# Patient Record
Sex: Female | Born: 1940 | Race: White | Hispanic: No | Marital: Married | Smoking: Never smoker
Health system: Southern US, Community
[De-identification: ages and names within clinical notes are randomized; demographics above are authoritative.]

## PROBLEM LIST (undated history)

## (undated) DIAGNOSIS — M1990 Unspecified osteoarthritis, unspecified site: Secondary | ICD-10-CM

## (undated) DIAGNOSIS — E785 Hyperlipidemia, unspecified: Secondary | ICD-10-CM

## (undated) DIAGNOSIS — M549 Dorsalgia, unspecified: Secondary | ICD-10-CM

## (undated) DIAGNOSIS — M419 Scoliosis, unspecified: Secondary | ICD-10-CM

## (undated) DIAGNOSIS — K56609 Unspecified intestinal obstruction, unspecified as to partial versus complete obstruction: Secondary | ICD-10-CM

## (undated) DIAGNOSIS — I480 Paroxysmal atrial fibrillation: Secondary | ICD-10-CM

## (undated) DIAGNOSIS — K219 Gastro-esophageal reflux disease without esophagitis: Secondary | ICD-10-CM

## (undated) DIAGNOSIS — G4733 Obstructive sleep apnea (adult) (pediatric): Secondary | ICD-10-CM

## (undated) DIAGNOSIS — M479 Spondylosis, unspecified: Secondary | ICD-10-CM

## (undated) DIAGNOSIS — M48061 Spinal stenosis, lumbar region without neurogenic claudication: Secondary | ICD-10-CM

## (undated) DIAGNOSIS — G8929 Other chronic pain: Secondary | ICD-10-CM

## (undated) DIAGNOSIS — I10 Essential (primary) hypertension: Secondary | ICD-10-CM

## (undated) DIAGNOSIS — I509 Heart failure, unspecified: Secondary | ICD-10-CM

## (undated) HISTORY — DX: Hyperlipidemia, unspecified: E78.5

## (undated) HISTORY — DX: Scoliosis, unspecified: M41.9

## (undated) HISTORY — DX: Unspecified intestinal obstruction, unspecified as to partial versus complete obstruction: K56.609

## (undated) HISTORY — DX: Spondylosis, unspecified: M47.9

## (undated) HISTORY — DX: Unspecified osteoarthritis, unspecified site: M19.90

## (undated) HISTORY — PX: BACK SURGERY: SHX140

## (undated) HISTORY — DX: Essential (primary) hypertension: I10

## (undated) HISTORY — DX: Paroxysmal atrial fibrillation: I48.0

## (undated) HISTORY — DX: Spinal stenosis, lumbar region without neurogenic claudication: M48.061

## (undated) HISTORY — PX: NASAL SEPTUM SURGERY: SHX37

## (undated) HISTORY — PX: POSTERIOR LUMBAR FUSION: SHX6036

---

## 1979-02-06 HISTORY — PX: LIPOMA EXCISION: SHX5283

## 1993-02-05 HISTORY — PX: TOTAL ABDOMINAL HYSTERECTOMY: SHX209

## 1999-07-28 ENCOUNTER — Other Ambulatory Visit: Admission: RE | Admit: 1999-07-28 | Discharge: 1999-07-28 | Payer: Self-pay | Admitting: *Deleted

## 2000-07-07 ENCOUNTER — Encounter: Payer: Self-pay | Admitting: Emergency Medicine

## 2000-07-07 ENCOUNTER — Inpatient Hospital Stay (HOSPITAL_COMMUNITY): Admission: EM | Admit: 2000-07-07 | Discharge: 2000-07-08 | Payer: Self-pay | Admitting: Emergency Medicine

## 2000-10-04 ENCOUNTER — Encounter: Admission: RE | Admit: 2000-10-04 | Discharge: 2000-10-04 | Payer: Self-pay | Admitting: Family Medicine

## 2000-10-04 ENCOUNTER — Encounter: Payer: Self-pay | Admitting: Family Medicine

## 2000-10-31 ENCOUNTER — Encounter: Payer: Self-pay | Admitting: Neurosurgery

## 2000-10-31 ENCOUNTER — Encounter: Admission: RE | Admit: 2000-10-31 | Discharge: 2000-10-31 | Payer: Self-pay | Admitting: Neurosurgery

## 2000-11-14 ENCOUNTER — Encounter: Admission: RE | Admit: 2000-11-14 | Discharge: 2000-11-14 | Payer: Self-pay | Admitting: Neurosurgery

## 2000-11-14 ENCOUNTER — Encounter: Payer: Self-pay | Admitting: Neurosurgery

## 2001-03-21 ENCOUNTER — Encounter: Payer: Self-pay | Admitting: Family Medicine

## 2001-03-21 ENCOUNTER — Encounter: Admission: RE | Admit: 2001-03-21 | Discharge: 2001-03-21 | Payer: Self-pay | Admitting: Family Medicine

## 2001-05-22 ENCOUNTER — Encounter: Admission: RE | Admit: 2001-05-22 | Discharge: 2001-05-22 | Payer: Self-pay | Admitting: Family Medicine

## 2001-05-22 ENCOUNTER — Encounter: Payer: Self-pay | Admitting: Family Medicine

## 2003-04-19 ENCOUNTER — Encounter: Admission: RE | Admit: 2003-04-19 | Discharge: 2003-04-19 | Payer: Self-pay | Admitting: Family Medicine

## 2003-05-04 ENCOUNTER — Ambulatory Visit (HOSPITAL_COMMUNITY): Admission: RE | Admit: 2003-05-04 | Discharge: 2003-05-04 | Payer: Self-pay | Admitting: Family Medicine

## 2004-09-15 ENCOUNTER — Ambulatory Visit: Payer: Self-pay | Admitting: Internal Medicine

## 2004-09-24 ENCOUNTER — Ambulatory Visit: Payer: Self-pay | Admitting: Internal Medicine

## 2004-10-22 ENCOUNTER — Emergency Department (HOSPITAL_COMMUNITY): Admission: EM | Admit: 2004-10-22 | Discharge: 2004-10-22 | Payer: Self-pay | Admitting: *Deleted

## 2004-12-09 HISTORY — PX: COLONOSCOPY: SHX174

## 2005-01-07 ENCOUNTER — Ambulatory Visit: Payer: Self-pay | Admitting: Internal Medicine

## 2005-02-18 ENCOUNTER — Ambulatory Visit: Payer: Self-pay | Admitting: Internal Medicine

## 2005-05-03 ENCOUNTER — Ambulatory Visit: Payer: Self-pay | Admitting: Internal Medicine

## 2005-08-26 ENCOUNTER — Ambulatory Visit: Payer: Self-pay | Admitting: Internal Medicine

## 2005-12-16 ENCOUNTER — Ambulatory Visit: Payer: Self-pay | Admitting: Internal Medicine

## 2006-01-31 ENCOUNTER — Ambulatory Visit: Payer: Self-pay | Admitting: Internal Medicine

## 2006-03-08 ENCOUNTER — Emergency Department (HOSPITAL_COMMUNITY): Admission: EM | Admit: 2006-03-08 | Discharge: 2006-03-08 | Payer: Self-pay | Admitting: Family Medicine

## 2006-04-21 ENCOUNTER — Ambulatory Visit: Payer: Self-pay | Admitting: Internal Medicine

## 2006-05-26 ENCOUNTER — Ambulatory Visit: Payer: Self-pay | Admitting: Cardiovascular Disease

## 2006-07-07 ENCOUNTER — Ambulatory Visit: Payer: Self-pay | Admitting: Cardiovascular Disease

## 2006-08-11 ENCOUNTER — Ambulatory Visit: Payer: Self-pay | Admitting: Internal Medicine

## 2006-12-08 ENCOUNTER — Ambulatory Visit: Payer: Self-pay | Admitting: Internal Medicine

## 2006-12-19 ENCOUNTER — Emergency Department (HOSPITAL_COMMUNITY): Admission: EM | Admit: 2006-12-19 | Discharge: 2006-12-19 | Payer: Self-pay | Admitting: Emergency Medicine

## 2006-12-21 ENCOUNTER — Encounter: Payer: Self-pay | Admitting: Cardiovascular Disease

## 2006-12-21 ENCOUNTER — Ambulatory Visit: Payer: Self-pay | Admitting: Cardiovascular Disease

## 2006-12-21 ENCOUNTER — Inpatient Hospital Stay (HOSPITAL_COMMUNITY): Admission: EM | Admit: 2006-12-21 | Discharge: 2006-12-24 | Payer: Self-pay | Admitting: Emergency Medicine

## 2006-12-30 ENCOUNTER — Ambulatory Visit: Payer: Self-pay | Admitting: Cardiology

## 2007-01-06 ENCOUNTER — Ambulatory Visit: Payer: Self-pay | Admitting: Internal Medicine

## 2007-01-13 ENCOUNTER — Ambulatory Visit: Payer: Self-pay | Admitting: Cardiology

## 2007-01-16 ENCOUNTER — Ambulatory Visit: Payer: Self-pay | Admitting: Cardiovascular Disease

## 2007-01-30 ENCOUNTER — Ambulatory Visit: Payer: Self-pay | Admitting: Cardiology

## 2007-01-31 ENCOUNTER — Ambulatory Visit: Payer: Self-pay | Admitting: Internal Medicine

## 2007-02-24 ENCOUNTER — Ambulatory Visit: Payer: Self-pay | Admitting: Cardiology

## 2007-03-24 ENCOUNTER — Ambulatory Visit: Payer: Self-pay | Admitting: Cardiology

## 2007-04-12 ENCOUNTER — Ambulatory Visit: Payer: Self-pay | Admitting: Cardiovascular Disease

## 2007-04-14 ENCOUNTER — Ambulatory Visit: Payer: Self-pay | Admitting: Cardiology

## 2007-04-21 ENCOUNTER — Ambulatory Visit: Payer: Self-pay | Admitting: Internal Medicine

## 2007-05-12 ENCOUNTER — Ambulatory Visit: Payer: Self-pay | Admitting: Cardiology

## 2007-06-09 ENCOUNTER — Ambulatory Visit: Payer: Self-pay | Admitting: Cardiology

## 2007-06-09 ENCOUNTER — Telehealth (INDEPENDENT_AMBULATORY_CARE_PROVIDER_SITE_OTHER): Payer: Self-pay | Admitting: *Deleted

## 2007-06-13 ENCOUNTER — Ambulatory Visit: Payer: Self-pay | Admitting: Cardiology

## 2007-06-30 ENCOUNTER — Ambulatory Visit: Payer: Self-pay | Admitting: Internal Medicine

## 2007-07-10 ENCOUNTER — Ambulatory Visit: Payer: Self-pay | Admitting: Cardiovascular Disease

## 2007-07-18 ENCOUNTER — Ambulatory Visit: Payer: Self-pay | Admitting: Cardiovascular Disease

## 2007-07-28 ENCOUNTER — Ambulatory Visit: Payer: Self-pay | Admitting: Cardiology

## 2007-08-17 ENCOUNTER — Ambulatory Visit: Payer: Self-pay | Admitting: Cardiology

## 2007-09-01 ENCOUNTER — Ambulatory Visit: Payer: Self-pay | Admitting: Internal Medicine

## 2007-09-01 ENCOUNTER — Ambulatory Visit: Payer: Self-pay | Admitting: Cardiology

## 2007-09-22 ENCOUNTER — Ambulatory Visit: Payer: Self-pay | Admitting: Internal Medicine

## 2007-10-20 ENCOUNTER — Ambulatory Visit: Payer: Self-pay | Admitting: Internal Medicine

## 2007-11-17 ENCOUNTER — Ambulatory Visit: Payer: Self-pay | Admitting: Cardiovascular Disease

## 2007-12-15 ENCOUNTER — Ambulatory Visit: Payer: Self-pay | Admitting: Cardiovascular Disease

## 2007-12-25 ENCOUNTER — Ambulatory Visit: Payer: Self-pay | Admitting: Cardiovascular Disease

## 2007-12-25 ENCOUNTER — Ambulatory Visit: Payer: Self-pay

## 2008-01-03 ENCOUNTER — Ambulatory Visit: Payer: Self-pay | Admitting: Internal Medicine

## 2008-01-15 ENCOUNTER — Ambulatory Visit: Payer: Self-pay | Admitting: Cardiology

## 2008-01-25 ENCOUNTER — Encounter: Payer: Self-pay | Admitting: Internal Medicine

## 2008-01-25 ENCOUNTER — Ambulatory Visit (HOSPITAL_BASED_OUTPATIENT_CLINIC_OR_DEPARTMENT_OTHER): Admission: RE | Admit: 2008-01-25 | Discharge: 2008-01-25 | Payer: Self-pay | Admitting: Internal Medicine

## 2008-01-30 DIAGNOSIS — M1990 Unspecified osteoarthritis, unspecified site: Secondary | ICD-10-CM | POA: Insufficient documentation

## 2008-01-30 DIAGNOSIS — J3089 Other allergic rhinitis: Secondary | ICD-10-CM

## 2008-01-30 DIAGNOSIS — J302 Other seasonal allergic rhinitis: Secondary | ICD-10-CM | POA: Insufficient documentation

## 2008-01-30 DIAGNOSIS — H409 Unspecified glaucoma: Secondary | ICD-10-CM | POA: Insufficient documentation

## 2008-01-31 ENCOUNTER — Ambulatory Visit: Payer: Self-pay | Admitting: Internal Medicine

## 2008-01-31 DIAGNOSIS — G4733 Obstructive sleep apnea (adult) (pediatric): Secondary | ICD-10-CM | POA: Insufficient documentation

## 2008-01-31 DIAGNOSIS — E785 Hyperlipidemia, unspecified: Secondary | ICD-10-CM | POA: Insufficient documentation

## 2008-02-05 ENCOUNTER — Ambulatory Visit: Payer: Self-pay | Admitting: Pulmonary Disease

## 2008-02-09 ENCOUNTER — Ambulatory Visit: Payer: Self-pay | Admitting: Cardiovascular Disease

## 2008-03-04 ENCOUNTER — Ambulatory Visit: Payer: Self-pay | Admitting: Internal Medicine

## 2008-03-06 ENCOUNTER — Encounter: Payer: Self-pay | Admitting: Internal Medicine

## 2008-03-08 ENCOUNTER — Ambulatory Visit: Payer: Self-pay | Admitting: Cardiovascular Disease

## 2008-03-27 ENCOUNTER — Encounter: Payer: Self-pay | Admitting: Internal Medicine

## 2008-04-04 ENCOUNTER — Ambulatory Visit: Payer: Self-pay | Admitting: Internal Medicine

## 2008-04-05 ENCOUNTER — Ambulatory Visit: Payer: Self-pay | Admitting: Internal Medicine

## 2008-04-23 DIAGNOSIS — I4891 Unspecified atrial fibrillation: Secondary | ICD-10-CM | POA: Insufficient documentation

## 2008-05-03 ENCOUNTER — Ambulatory Visit: Payer: Self-pay | Admitting: Internal Medicine

## 2008-05-09 ENCOUNTER — Ambulatory Visit: Payer: Self-pay | Admitting: Internal Medicine

## 2008-05-24 ENCOUNTER — Ambulatory Visit: Payer: Self-pay | Admitting: Cardiovascular Disease

## 2008-06-11 ENCOUNTER — Ambulatory Visit: Payer: Self-pay | Admitting: Internal Medicine

## 2008-06-25 ENCOUNTER — Ambulatory Visit: Payer: Self-pay | Admitting: Cardiovascular Disease

## 2008-06-25 LAB — CONVERTED CEMR LAB
BUN: 16 mg/dL (ref 6–23)
CO2: 31 meq/L (ref 19–32)
Calcium: 9.4 mg/dL (ref 8.4–10.5)
Chloride: 108 meq/L (ref 96–112)
Creatinine, Ser: 0.9 mg/dL (ref 0.4–1.2)
GFR calc Af Amer: 81 mL/min
GFR calc non Af Amer: 67 mL/min
Glucose, Bld: 99 mg/dL (ref 70–99)
Potassium: 4.2 meq/L (ref 3.5–5.1)
Sodium: 143 meq/L (ref 135–145)

## 2008-06-27 ENCOUNTER — Ambulatory Visit: Payer: Self-pay | Admitting: Cardiology

## 2008-06-28 ENCOUNTER — Encounter: Payer: Self-pay | Admitting: Internal Medicine

## 2008-07-03 ENCOUNTER — Ambulatory Visit: Payer: Self-pay | Admitting: Internal Medicine

## 2008-07-09 ENCOUNTER — Ambulatory Visit: Payer: Self-pay | Admitting: Cardiology

## 2008-08-02 ENCOUNTER — Encounter: Admission: RE | Admit: 2008-08-02 | Discharge: 2008-08-02 | Payer: Self-pay | Admitting: Family Medicine

## 2008-08-06 ENCOUNTER — Ambulatory Visit: Payer: Self-pay | Admitting: Internal Medicine

## 2008-08-29 ENCOUNTER — Ambulatory Visit: Payer: Self-pay | Admitting: Internal Medicine

## 2008-09-03 ENCOUNTER — Ambulatory Visit: Payer: Self-pay | Admitting: Cardiology

## 2008-09-24 ENCOUNTER — Ambulatory Visit: Payer: Self-pay | Admitting: Cardiology

## 2008-10-01 ENCOUNTER — Ambulatory Visit: Payer: Self-pay | Admitting: Internal Medicine

## 2008-10-11 ENCOUNTER — Ambulatory Visit: Payer: Self-pay | Admitting: Cardiovascular Disease

## 2008-11-01 ENCOUNTER — Ambulatory Visit: Payer: Self-pay | Admitting: Internal Medicine

## 2008-11-01 LAB — CONVERTED CEMR LAB
POC INR: 2
Protime: 17.5

## 2008-11-06 ENCOUNTER — Encounter: Payer: Self-pay | Admitting: *Deleted

## 2008-11-22 ENCOUNTER — Encounter (INDEPENDENT_AMBULATORY_CARE_PROVIDER_SITE_OTHER): Payer: Self-pay | Admitting: Pharmacist

## 2008-11-22 ENCOUNTER — Ambulatory Visit: Payer: Self-pay | Admitting: Internal Medicine

## 2008-11-22 LAB — CONVERTED CEMR LAB
POC INR: 2.3
Protime: 18.4

## 2008-12-03 ENCOUNTER — Telehealth: Payer: Self-pay | Admitting: Cardiovascular Disease

## 2008-12-10 ENCOUNTER — Telehealth: Payer: Self-pay | Admitting: Cardiovascular Disease

## 2008-12-11 ENCOUNTER — Ambulatory Visit: Payer: Self-pay | Admitting: Internal Medicine

## 2008-12-11 ENCOUNTER — Encounter: Payer: Self-pay | Admitting: *Deleted

## 2008-12-11 LAB — CONVERTED CEMR LAB
POC INR: 1.1
Prothrombin Time: 12.9 s

## 2008-12-18 ENCOUNTER — Emergency Department (HOSPITAL_COMMUNITY): Admission: EM | Admit: 2008-12-18 | Discharge: 2008-12-18 | Payer: Self-pay | Admitting: Family Medicine

## 2008-12-18 ENCOUNTER — Emergency Department (HOSPITAL_COMMUNITY): Admission: EM | Admit: 2008-12-18 | Discharge: 2008-12-18 | Payer: Self-pay | Admitting: Emergency Medicine

## 2008-12-20 ENCOUNTER — Ambulatory Visit: Payer: Self-pay | Admitting: Cardiology

## 2008-12-20 LAB — CONVERTED CEMR LAB
POC INR: 1.6
Prothrombin Time: 15.8 s

## 2008-12-30 ENCOUNTER — Ambulatory Visit: Payer: Self-pay | Admitting: Cardiology

## 2008-12-30 LAB — CONVERTED CEMR LAB
POC INR: 1.9
Prothrombin Time: 16.8 s

## 2008-12-31 ENCOUNTER — Ambulatory Visit: Payer: Self-pay | Admitting: Internal Medicine

## 2009-01-13 ENCOUNTER — Encounter: Payer: Self-pay | Admitting: Cardiovascular Disease

## 2009-01-14 ENCOUNTER — Ambulatory Visit: Payer: Self-pay | Admitting: Internal Medicine

## 2009-01-14 LAB — CONVERTED CEMR LAB
POC INR: 2.1
Prothrombin Time: 17.9 s

## 2009-01-20 ENCOUNTER — Telehealth (INDEPENDENT_AMBULATORY_CARE_PROVIDER_SITE_OTHER): Payer: Self-pay | Admitting: *Deleted

## 2009-01-27 ENCOUNTER — Ambulatory Visit: Payer: Self-pay | Admitting: Cardiology

## 2009-01-27 LAB — CONVERTED CEMR LAB: POC INR: 1

## 2009-02-04 ENCOUNTER — Ambulatory Visit: Payer: Self-pay | Admitting: Cardiology

## 2009-02-04 LAB — CONVERTED CEMR LAB: POC INR: 3.4

## 2009-02-05 HISTORY — PX: LUMBAR DISC SURGERY: SHX700

## 2009-02-05 HISTORY — PX: REPAIR DURAL / CSF LEAK: SUR1169

## 2009-02-13 ENCOUNTER — Encounter: Admission: RE | Admit: 2009-02-13 | Discharge: 2009-02-13 | Payer: Self-pay | Admitting: Family Medicine

## 2009-02-19 ENCOUNTER — Telehealth: Payer: Self-pay | Admitting: Cardiovascular Disease

## 2009-02-21 ENCOUNTER — Encounter (INDEPENDENT_AMBULATORY_CARE_PROVIDER_SITE_OTHER): Payer: Self-pay | Admitting: *Deleted

## 2009-02-25 ENCOUNTER — Inpatient Hospital Stay (HOSPITAL_COMMUNITY): Admission: RE | Admit: 2009-02-25 | Discharge: 2009-02-26 | Payer: Self-pay | Admitting: Neurosurgery

## 2009-03-03 ENCOUNTER — Ambulatory Visit: Payer: Self-pay | Admitting: Cardiology

## 2009-03-03 LAB — CONVERTED CEMR LAB: POC INR: 1.1

## 2009-03-10 ENCOUNTER — Ambulatory Visit: Payer: Self-pay | Admitting: Internal Medicine

## 2009-03-10 LAB — CONVERTED CEMR LAB: POC INR: 2.6

## 2009-03-24 ENCOUNTER — Ambulatory Visit: Payer: Self-pay | Admitting: Cardiovascular Disease

## 2009-03-24 LAB — CONVERTED CEMR LAB: POC INR: 3.1

## 2009-03-31 ENCOUNTER — Ambulatory Visit: Payer: Self-pay | Admitting: Internal Medicine

## 2009-04-15 ENCOUNTER — Ambulatory Visit: Payer: Self-pay | Admitting: Cardiology

## 2009-04-15 LAB — CONVERTED CEMR LAB: POC INR: 2.6

## 2009-04-25 ENCOUNTER — Ambulatory Visit: Payer: Self-pay | Admitting: Internal Medicine

## 2009-05-03 ENCOUNTER — Emergency Department (HOSPITAL_COMMUNITY): Admission: EM | Admit: 2009-05-03 | Discharge: 2009-05-03 | Payer: Self-pay | Admitting: Emergency Medicine

## 2009-05-13 ENCOUNTER — Ambulatory Visit: Payer: Self-pay | Admitting: Cardiovascular Disease

## 2009-05-13 LAB — CONVERTED CEMR LAB: POC INR: 4.2

## 2009-05-15 ENCOUNTER — Telehealth: Payer: Self-pay | Admitting: Cardiology

## 2009-05-16 ENCOUNTER — Encounter: Admission: RE | Admit: 2009-05-16 | Discharge: 2009-05-16 | Payer: Self-pay | Admitting: Neurosurgery

## 2009-05-20 ENCOUNTER — Encounter: Payer: Self-pay | Admitting: Cardiovascular Disease

## 2009-05-20 ENCOUNTER — Telehealth: Payer: Self-pay | Admitting: Cardiovascular Disease

## 2009-05-21 ENCOUNTER — Ambulatory Visit: Payer: Self-pay | Admitting: Internal Medicine

## 2009-05-21 ENCOUNTER — Encounter: Payer: Self-pay | Admitting: Physician Assistant

## 2009-05-26 ENCOUNTER — Telehealth (INDEPENDENT_AMBULATORY_CARE_PROVIDER_SITE_OTHER): Payer: Self-pay | Admitting: *Deleted

## 2009-05-27 ENCOUNTER — Inpatient Hospital Stay (HOSPITAL_COMMUNITY): Admission: RE | Admit: 2009-05-27 | Discharge: 2009-06-02 | Payer: Self-pay | Admitting: Neurosurgery

## 2009-06-07 HISTORY — PX: CATARACT EXTRACTION W/ INTRAOCULAR LENS  IMPLANT, BILATERAL: SHX1307

## 2009-06-09 ENCOUNTER — Ambulatory Visit: Payer: Self-pay | Admitting: Cardiology

## 2009-06-09 LAB — CONVERTED CEMR LAB: POC INR: 1.4

## 2009-06-16 ENCOUNTER — Ambulatory Visit: Payer: Self-pay | Admitting: Internal Medicine

## 2009-06-16 LAB — CONVERTED CEMR LAB: POC INR: 2.8

## 2009-06-19 ENCOUNTER — Encounter: Payer: Self-pay | Admitting: Cardiovascular Disease

## 2009-06-25 ENCOUNTER — Ambulatory Visit: Payer: Self-pay | Admitting: Cardiovascular Disease

## 2009-06-27 LAB — CONVERTED CEMR LAB
BUN: 13 mg/dL (ref 6–23)
Basophils Absolute: 0 10*3/uL (ref 0.0–0.1)
Basophils Relative: 0 % (ref 0.0–3.0)
CO2: 29 meq/L (ref 19–32)
Calcium: 9.5 mg/dL (ref 8.4–10.5)
Chloride: 105 meq/L (ref 96–112)
Creatinine, Ser: 0.9 mg/dL (ref 0.4–1.2)
Eosinophils Absolute: 0.1 10*3/uL (ref 0.0–0.7)
Eosinophils Relative: 1.6 % (ref 0.0–5.0)
Free T4: 0.9 ng/dL (ref 0.6–1.6)
GFR calc non Af Amer: 66.47 mL/min (ref >60)
Glucose, Bld: 105 mg/dL — ABNORMAL HIGH (ref 70–99)
HCT: 38.2 % (ref 36.0–46.0)
Hemoglobin: 12.5 g/dL (ref 12.0–15.0)
Lymphocytes Relative: 16.8 % (ref 12.0–46.0)
Lymphs Abs: 1.1 10*3/uL (ref 0.7–4.0)
MCHC: 32.6 g/dL (ref 30.0–36.0)
MCV: 88.9 fL (ref 78.0–100.0)
Monocytes Absolute: 0.5 10*3/uL (ref 0.1–1.0)
Monocytes Relative: 8.6 % (ref 3.0–12.0)
Neutro Abs: 4.6 10*3/uL (ref 1.4–7.7)
Neutrophils Relative %: 73 % (ref 43.0–77.0)
Platelets: 241 10*3/uL (ref 150.0–400.0)
Potassium: 3.4 meq/L — ABNORMAL LOW (ref 3.5–5.1)
RBC: 4.29 M/uL (ref 3.87–5.11)
RDW: 14.1 % (ref 11.5–14.6)
Sodium: 142 meq/L (ref 135–145)
TSH: 1.37 microintl units/mL (ref 0.35–5.50)
WBC: 6.3 10*3/uL (ref 4.5–10.5)

## 2009-06-30 ENCOUNTER — Ambulatory Visit: Payer: Self-pay | Admitting: Cardiology

## 2009-06-30 LAB — CONVERTED CEMR LAB: POC INR: 3.8

## 2009-07-14 ENCOUNTER — Ambulatory Visit: Payer: Self-pay | Admitting: Cardiovascular Disease

## 2009-07-14 LAB — CONVERTED CEMR LAB: POC INR: 3

## 2009-08-04 ENCOUNTER — Ambulatory Visit: Payer: Self-pay | Admitting: Cardiology

## 2009-08-04 LAB — CONVERTED CEMR LAB: POC INR: 5.3

## 2009-08-07 ENCOUNTER — Ambulatory Visit: Payer: Self-pay | Admitting: Cardiovascular Disease

## 2009-08-14 ENCOUNTER — Ambulatory Visit: Payer: Self-pay | Admitting: Cardiology

## 2009-08-14 LAB — CONVERTED CEMR LAB: POC INR: 2

## 2009-08-21 ENCOUNTER — Ambulatory Visit: Payer: Self-pay | Admitting: Internal Medicine

## 2009-09-03 ENCOUNTER — Telehealth: Payer: Self-pay | Admitting: Cardiovascular Disease

## 2009-09-04 ENCOUNTER — Ambulatory Visit: Payer: Self-pay | Admitting: Internal Medicine

## 2009-09-04 LAB — CONVERTED CEMR LAB: POC INR: 2.4

## 2009-09-29 ENCOUNTER — Ambulatory Visit: Payer: Self-pay | Admitting: Internal Medicine

## 2009-10-02 ENCOUNTER — Ambulatory Visit: Payer: Self-pay | Admitting: Cardiology

## 2009-10-02 LAB — CONVERTED CEMR LAB: POC INR: 2.6

## 2009-10-27 ENCOUNTER — Encounter: Admission: RE | Admit: 2009-10-27 | Discharge: 2009-10-27 | Payer: Self-pay | Admitting: Family Medicine

## 2009-10-29 ENCOUNTER — Ambulatory Visit: Payer: Self-pay | Admitting: Internal Medicine

## 2009-10-29 LAB — CONVERTED CEMR LAB: POC INR: 2.8

## 2009-11-26 ENCOUNTER — Ambulatory Visit: Payer: Self-pay | Admitting: Cardiovascular Disease

## 2009-11-26 LAB — CONVERTED CEMR LAB: POC INR: 2.9

## 2009-12-17 ENCOUNTER — Ambulatory Visit: Payer: Self-pay | Admitting: Internal Medicine

## 2009-12-24 ENCOUNTER — Ambulatory Visit: Payer: Self-pay | Admitting: Cardiology

## 2009-12-24 LAB — CONVERTED CEMR LAB: POC INR: 2.5

## 2009-12-30 ENCOUNTER — Encounter: Payer: Self-pay | Admitting: Cardiovascular Disease

## 2010-01-21 ENCOUNTER — Ambulatory Visit: Payer: Self-pay | Admitting: Cardiology

## 2010-01-21 LAB — CONVERTED CEMR LAB: POC INR: 2.3

## 2010-02-19 ENCOUNTER — Ambulatory Visit: Payer: Self-pay | Admitting: Cardiology

## 2010-02-19 LAB — CONVERTED CEMR LAB: POC INR: 2.4

## 2010-02-24 ENCOUNTER — Encounter: Payer: Self-pay | Admitting: Internal Medicine

## 2010-03-03 ENCOUNTER — Ambulatory Visit: Payer: Self-pay | Admitting: Cardiovascular Disease

## 2010-03-03 DIAGNOSIS — R609 Edema, unspecified: Secondary | ICD-10-CM | POA: Insufficient documentation

## 2010-03-19 ENCOUNTER — Ambulatory Visit: Payer: Self-pay | Admitting: Internal Medicine

## 2010-03-19 LAB — CONVERTED CEMR LAB: POC INR: 2.7

## 2010-03-30 ENCOUNTER — Ambulatory Visit: Payer: Self-pay | Admitting: Internal Medicine

## 2010-04-16 ENCOUNTER — Ambulatory Visit: Payer: Self-pay | Admitting: Cardiology

## 2010-04-16 LAB — CONVERTED CEMR LAB: POC INR: 2.5

## 2010-04-20 ENCOUNTER — Ambulatory Visit: Payer: Self-pay | Admitting: Internal Medicine

## 2010-05-14 ENCOUNTER — Ambulatory Visit: Payer: Self-pay | Admitting: Internal Medicine

## 2010-05-14 LAB — CONVERTED CEMR LAB: POC INR: 2.3

## 2010-05-18 ENCOUNTER — Encounter: Payer: Self-pay | Admitting: Cardiovascular Disease

## 2010-06-11 ENCOUNTER — Ambulatory Visit: Admission: RE | Admit: 2010-06-11 | Discharge: 2010-06-11 | Payer: Self-pay | Source: Home / Self Care

## 2010-06-11 LAB — CONVERTED CEMR LAB: POC INR: 2.6

## 2010-06-15 ENCOUNTER — Encounter
Admission: RE | Admit: 2010-06-15 | Discharge: 2010-06-15 | Payer: Self-pay | Source: Home / Self Care | Attending: Neurosurgery | Admitting: Neurosurgery

## 2010-06-19 ENCOUNTER — Encounter: Payer: Self-pay | Admitting: Physician Assistant

## 2010-06-19 ENCOUNTER — Ambulatory Visit
Admission: RE | Admit: 2010-06-19 | Discharge: 2010-06-19 | Payer: Self-pay | Source: Home / Self Care | Attending: Physician Assistant | Admitting: Physician Assistant

## 2010-06-29 LAB — CBC
HCT: 40.8 % (ref 36.0–46.0)
Hemoglobin: 12.6 g/dL (ref 12.0–15.0)
MCH: 26.5 pg (ref 26.0–34.0)
MCHC: 30.9 g/dL (ref 30.0–36.0)
MCV: 85.9 fL (ref 78.0–100.0)
Platelets: 170 10*3/uL (ref 150–400)
RBC: 4.75 MIL/uL (ref 3.87–5.11)
RDW: 14.9 % (ref 11.5–15.5)
WBC: 7 10*3/uL (ref 4.0–10.5)

## 2010-06-29 LAB — BASIC METABOLIC PANEL
BUN: 15 mg/dL (ref 6–23)
CO2: 28 mEq/L (ref 19–32)
Calcium: 9.7 mg/dL (ref 8.4–10.5)
Chloride: 105 mEq/L (ref 96–112)
Creatinine, Ser: 1.05 mg/dL (ref 0.4–1.2)
GFR calc Af Amer: >60 mL/min (ref >60)
GFR calc non Af Amer: 52 mL/min — ABNORMAL LOW (ref >60)
Glucose, Bld: 120 mg/dL — ABNORMAL HIGH (ref 70–99)
Potassium: 4.3 mEq/L (ref 3.5–5.1)
Sodium: 143 mEq/L (ref 135–145)

## 2010-06-29 LAB — URINALYSIS, ROUTINE W REFLEX MICROSCOPIC
Bilirubin Urine: NEGATIVE
Hgb urine dipstick: NEGATIVE
Ketones, ur: NEGATIVE mg/dL
Nitrite: NEGATIVE
Protein, ur: NEGATIVE mg/dL
Specific Gravity, Urine: 1.017 (ref 1.005–1.030)
Urine Glucose, Fasting: NEGATIVE mg/dL
Urobilinogen, UA: 1 mg/dL (ref 0.0–1.0)
pH: 7.5 (ref 5.0–8.0)

## 2010-06-29 LAB — URINE MICROSCOPIC-ADD ON

## 2010-06-29 LAB — ABO/RH: ABO/RH(D): A POS

## 2010-06-29 LAB — PROTIME-INR
INR: 1.67 — ABNORMAL HIGH (ref 0.00–1.49)
Prothrombin Time: 19.9 seconds — ABNORMAL HIGH (ref 11.6–15.2)

## 2010-06-29 LAB — TYPE AND SCREEN
ABO/RH(D): A POS
Antibody Screen: NEGATIVE

## 2010-06-29 LAB — SURGICAL PCR SCREEN
MRSA, PCR: NEGATIVE
Staphylococcus aureus: POSITIVE — AB

## 2010-06-29 LAB — APTT: aPTT: 35 seconds (ref 24–37)

## 2010-06-30 ENCOUNTER — Inpatient Hospital Stay (HOSPITAL_COMMUNITY)
Admission: RE | Admit: 2010-06-30 | Discharge: 2010-07-08 | DRG: 460 | Disposition: A | Discharge status: Home Health | Payer: Medicare Other | Attending: Neurosurgery | Admitting: Neurosurgery

## 2010-06-30 DIAGNOSIS — I4891 Unspecified atrial fibrillation: Secondary | ICD-10-CM | POA: Diagnosis present

## 2010-06-30 DIAGNOSIS — M47817 Spondylosis without myelopathy or radiculopathy, lumbosacral region: Principal | ICD-10-CM | POA: Diagnosis present

## 2010-06-30 DIAGNOSIS — K219 Gastro-esophageal reflux disease without esophagitis: Secondary | ICD-10-CM | POA: Diagnosis present

## 2010-06-30 DIAGNOSIS — I10 Essential (primary) hypertension: Secondary | ICD-10-CM | POA: Diagnosis present

## 2010-06-30 DIAGNOSIS — Z7901 Long term (current) use of anticoagulants: Secondary | ICD-10-CM

## 2010-06-30 DIAGNOSIS — H409 Unspecified glaucoma: Secondary | ICD-10-CM | POA: Diagnosis present

## 2010-07-01 LAB — POCT I-STAT 4, (NA,K, GLUC, HGB,HCT)
Glucose, Bld: 105 mg/dL — ABNORMAL HIGH (ref 70–99)
HCT: 28 % — ABNORMAL LOW (ref 36.0–46.0)
Hemoglobin: 9.5 g/dL — ABNORMAL LOW (ref 12.0–15.0)
Potassium: 4.4 mEq/L (ref 3.5–5.1)
Sodium: 139 mEq/L (ref 135–145)

## 2010-07-01 LAB — CBC
HCT: 29.3 % — ABNORMAL LOW (ref 36.0–46.0)
Hemoglobin: 9.5 g/dL — ABNORMAL LOW (ref 12.0–15.0)
MCH: 28 pg (ref 26.0–34.0)
MCHC: 32.4 g/dL (ref 30.0–36.0)
MCV: 86.4 fL (ref 78.0–100.0)
Platelets: 113 10*3/uL — ABNORMAL LOW (ref 150–400)
RBC: 3.39 MIL/uL — ABNORMAL LOW (ref 3.87–5.11)
RDW: 15.3 % (ref 11.5–15.5)
WBC: 6 10*3/uL (ref 4.0–10.5)

## 2010-07-01 LAB — PROTIME-INR
INR: 1.02 (ref 0.00–1.49)
Prothrombin Time: 13.6 seconds (ref 11.6–15.2)

## 2010-07-01 LAB — APTT: aPTT: 25 seconds (ref 24–37)

## 2010-07-04 LAB — CBC
HCT: 27.9 % — ABNORMAL LOW (ref 36.0–46.0)
Hemoglobin: 9.3 g/dL — ABNORMAL LOW (ref 12.0–15.0)
MCH: 28.3 pg (ref 26.0–34.0)
MCHC: 33.3 g/dL (ref 30.0–36.0)
MCV: 84.8 fL (ref 78.0–100.0)
Platelets: 183 10*3/uL (ref 150–400)
RBC: 3.29 MIL/uL — ABNORMAL LOW (ref 3.87–5.11)
RDW: 15.6 % — ABNORMAL HIGH (ref 11.5–15.5)
WBC: 4.3 10*3/uL (ref 4.0–10.5)

## 2010-07-04 LAB — BASIC METABOLIC PANEL
BUN: 15 mg/dL (ref 6–23)
CO2: 25 mEq/L (ref 19–32)
Calcium: 8.3 mg/dL — ABNORMAL LOW (ref 8.4–10.5)
Chloride: 98 mEq/L (ref 96–112)
Creatinine, Ser: 0.99 mg/dL (ref 0.4–1.2)
GFR calc Af Amer: >60 mL/min (ref >60)
GFR calc non Af Amer: 56 mL/min — ABNORMAL LOW (ref >60)
Glucose, Bld: 127 mg/dL — ABNORMAL HIGH (ref 70–99)
Potassium: 4.3 mEq/L (ref 3.5–5.1)
Sodium: 135 mEq/L (ref 135–145)

## 2010-07-07 NOTE — Assessment & Plan Note (Signed)
Summary: 6 mo f/u ./cy   Primary Provider:  Manus Gunning  CC:  dizziness alittle sob.  History of Present Illness: She has a history of paroxysmal atrial fibrillation on Coumadin. She has history of atypical chest pain with a normal cardiac catheter in 2002, and her last Myoview was in 2009. She denies recurrent chest pain, palpitations, dizziness, or presyncope. She does have chronic dyspnea on exertion. She has had a rough time with two recent back surgeries the 2nd for a leak.  She has been tachycardic since D/C but not in afib.  She had been on Toprol in the past and we restarted it her last visit.  It seems to have helped. She just had eye surgery and is doing well.  Her coumadin was not stopped for this.  She needs as needed Lasix to go with her HCTZ for lower extremity edema  Current Problems (verified): 1)  Pre-operative Cardiovascular Examination  (ICD-V72.81) 2)  Coumadin Therapy  (ICD-V58.61) 3)  Atrial Fibrillation  (ICD-427.31) 4)  Hyperlipidemia  (ICD-272.4) 5)  ? 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 Take 1 Tablet By Mouth Once A Day 2)  Diltiazem Hcl Cr 240 Mg Xr24h-Cap (Diltiazem Hcl) .... Take 1 Capsule By Mouth Once A Day 3)  Pravachol 40 Mg Tabs (Pravastatin Sodium) .Marland Kitchen.. 1 Tab By Mouth Once Daily 4)  Allergy Vaccine  1:10 Go (W-E) .... Once Weekly 5)  Flax Seed Oil 1000 Mg Caps (Flaxseed (Linseed)) .... Take 1 By Mouth Once Daily 6)  Fish Oil 1000 Mg Caps (Omega-3 Fatty Acids) .... Take 1 By Mouth Once Daily 7)  Vitamin C 500 Mg  Tabs (Ascorbic Acid) .... Take 1 Tablet By Mouth Once Daily 8)  Calcium-Magnesium-Zinc 333-133-5 Mg Tabs (Calcium-Magnesium-Zinc) .... Take 1 Tablet By Mouth Once Daily 9)  Warfarin Sodium 5 Mg Tabs (Warfarin  Sodium) .... Take As Directed 10)  Triamterene-Hctz 37.5-25 Mg Tabs (Triamterene-Hctz) .... Take 1 Tablet Daily 11)  Cyclobenzaprine Hcl 10 Mg Tabs (Cyclobenzaprine Hcl) .... Take 1 Tablet Every 8 Hours 12)  Omeprazole 20 Mg Cpdr (Omeprazole) .Marland Kitchen.. 1 Tab By Mouth Once Daily 13)  Metoprolol Succinate 100 Mg Xr24h-Tab (Metoprolol Succinate) .... One Daily 14)  Pramipexole Dihydrochloride 0.5 Mg Tabs (Pramipexole Dihydrochloride) .... Take 1/2 To 1 Tablet By Mouth At Bedtime 15)  Allegra 180 Mg Tabs (Fexofenadine Hcl) .Marland Kitchen.. 1 Tablet By Mouth Daily As Needed For Allergies 16)  Lumigan 0.01 % Soln (Bimatoprost) .... As Idrected  Allergies (verified): No Known Drug Allergies  Past History:  Past Medical History: Last updated: 04/23/2008 PAF/ coumadin Hypertension DJD Allergic rhinitis       Vaccine Hyperlipidemia Sleep Apnea  NPSG 01/25/08 AHI 6.3        CPAP auto- titrated to 10  Past Surgical History: Last updated: 09/29/2009 Septoplasty Total Abdominal Hysterectomy Lipoma Lumbar disk surgery Repair spinal fluid leak  Family History: Last updated: 03/27/08 mother-deceased natural causes, arthritis father-deceased age 28-MI sister-HBP, asthma, allergies  Social History: Last updated: 2008-03-27 Patient never smoked.  Pt is married, and has 2 children. Pt is currently employeed in clerical office work. Husband immunosuppressed after heart transplant in 2000  Review of Systems       Denies fever, malais, weight loss, blurry vision, decreased visual acuity, cough, sputum, SOB,  hemoptysis, pleuritic pain, palpitaitons, heartburn, abdominal pain, melena,  claudication, or rash.   Vital Signs:  Patient profile:   70 year old female Height:      66 inches Weight:      194 pounds BMI:     31.43 Pulse rate:   70 / minute Resp:     16 per minute BP sitting:   113 / 68  (right arm)  Vitals Entered By: Kem Parkinson (March 03, 2010 11:22 AM)  Physical  Exam  General:  Affect appropriate Healthy:  appears stated age HEENT: normal Neck supple with no adenopathy JVP normal no bruits no thyromegaly Lungs clear with no wheezing and good diaphragmatic motion Heart:  S1/S2 no murmur,rub, gallop or click PMI normal Abdomen: benighn, BS positve, no tenderness, no AAA no bruit.  No HSM or HJR Distal pulses intact with no bruits Plus one  edema Neuro non-focal Skin warm and dry    Impression & Recommendations:  Problem # 1:  COUMADIN THERAPY (ICD-V58.61) PAF with good INR levels and no bleeding  Problem # 2:  ATRIAL FIBRILLATION (ICD-427.31) Maint NSR with no palpitatoins Her updated medication list for this problem includes:    Warfarin Sodium 5 Mg Tabs (Warfarin sodium) .Marland Kitchen... Take as directed    Metoprolol Succinate 100 Mg Xr24h-tab (Metoprolol succinate) ..... One daily  Problem # 3:  HYPERLIPIDEMIA (ICD-272.4) F/U labs in 6 months Her updated medication list for this problem includes:    Pravachol 40 Mg Tabs (Pravastatin sodium) .Marland Kitchen... 1 tab by mouth once daily  Problem # 4:  GLAUCOMA (ICD-365.9) S/P surgery with improved vision.  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  as needed lasix will be called in to go with chronic thiazide.  Low sodium diet discussed  Patient Instructions: 1)  Your physician recommends that you schedule a follow-up appointment in: 6 MONTHS 2)  Your physician has recommended you make the following change in your medication: TAKE FUROSEMIDE 20MG  ONE TABLET DAILY AS NEEDED FOR SWELLING Prescriptions: FUROSEMIDE 20 MG TABS (FUROSEMIDE) Take one tablet by mouth daily as needed for swelling  #30 x 0   Entered by:   Deliah Goody, RN   Authorized by:   Colon Branch, MD, [facility]   Signed by:   Deliah Goody, RN on 03/03/2010   Method used:   Electronically to        [facility]* (retail)       [address] [place], [place]  [address]       Ph: [phone] or [phone]       Fax: [phone]   RxID:   [id number]

## 2010-07-07 NOTE — Letter (Signed)
Summary: [facility] - Delinquent Coumadin 1  [facility], Main Office  [address]   [place], [place] [address]   Phone: [phone]  Fax: [phone]     February 21, 2009 MRN: [id number]   [facility] [address] [place], [place]  [address]   Dear Ms. Arviso,  This letter is being sent to you as a reminder that it is necessary for you to get your INR/PT checked regularly so that we can optimize your care.  Our records indicate that you were scheduled to have a test done recently.  As of today, we have not received the results of this test.  It is very important that you have your INR checked.  Please call our office at the number listed above to schedule an appointment at your earliest convenience.    If you have recently had your protime checked or have discontinued this medication, please contact our office at the above phone number to clarify this issue.  Thank you for this prompt attention to this important health care matter.  Sincerely,    [facility]

## 2010-07-07 NOTE — Letter (Signed)
Summary: [facility]  [facility]   Imported By: Lester Lehi 02/26/2010 10:28:14  _____________________________________________________________________  External Attachment:    Type:   Image     Comment:   External Document

## 2010-07-07 NOTE — Medication Information (Signed)
Summary: rov/ewj  Anticoagulant Therapy  Managed by: Weston Brass, PharmD Referring MD: Charlton Haws MD PCP: Ihor Austin MD: Ladona Ridgel MD, Sharlot Gowda Indication 1: Atrial Fibrillation (ICD-427.31) Lab Used: [facility] Site: [facility] INR POC 2.7 INR RANGE 2 - 3  Dietary changes: no    Health status changes: no    Bleeding/hemorrhagic complications: no    Recent/future hospitalizations: no    Any changes in medication regimen? yes       Details: Taking flax seed twice a day  Recent/future dental: no  Any missed doses?: no       Is patient compliant with meds? yes       Allergies: No Known Drug Allergies  Anticoagulation Management History:      The patient is taking warfarin and comes in today for a routine follow up visit.  Positive risk factors for bleeding include an age of 68 years or older.  The bleeding index is 'intermediate risk'.  Negative CHADS2 values include Age > 68 years old.  The start date was 12/21/2006.  Anticoagulation responsible provider: Ladona Ridgel MD, Sharlot Gowda.  INR POC: 2.7.  Cuvette Lot#: [id number].  Exp: 04/2011.    Anticoagulation Management Assessment/Plan:      The patient's current anticoagulation dose is Warfarin sodium 5 mg tabs: Take as directed.  The target INR is 2.0-3.0.  The next INR is due 04/16/2010.  Anticoagulation instructions were given to patient.  Results were reviewed/authorized by Weston Brass, PharmD.  She was notified by Ilean Skill D candidate.         Prior Anticoagulation Instructions: INR 2.4  Continue on same dosage 1/2 tablet daily except 1 tablet on Mondays.  Recheck in 4 weeks.    Current Anticoagulation Instructions: INR 2.7  Continue taking 1/2 tablet everyday except 1 tablet on Monday. Recheck in 4 weeks.

## 2010-07-07 NOTE — Assessment & Plan Note (Signed)
Summary: NEEDS SURGICAL CLEARENCE FOR BACK SURGERY/DM   Visit Type:  Pre-op Evaluation Primary Provider:  Ehinger  CC:  sob.  History of Present Illness: this is a 70 year old white female, patient of Dr. [name] who is here for preop surgical clearance. She had back surgery back in September, at which time her Coumadin was stopped, and she had no problems. She now has a spinal fluid leakage, and is scheduled for surgery next Tuesday with Dr. Phoebe Perch. She stopped her Coumadin yesterday. She is in a significant amount of pain and is a little bit tachycardic today.  She has a history of paroxysmal atrial fibrillation on Coumadin. She has history of atypical chest pain with a normal cardiac catheter in 2002, and her last Myoview was in 2009. She denies recurrent chest pain, palpitations, dizziness, or presyncope. She does have chronic dyspnea on exertion.  Current Medications (verified): 1)  Xalatan 0.005 % Soln (Latanoprost) .... As Directed 2)  Centrum Silver  Tabs (Multiple Vitamins-Minerals) .... Take 1 Tablet By Mouth Once A Day 3)  Diltiazem Hcl Cr 240 Mg Xr24h-Cap (Diltiazem Hcl) .... Take 1 Capsule By Mouth Once A Day 4)  Menostar 14 Mcg/24hr Ptwk (Estradiol) .... Apply Patch Once Weekly 5)  Pravastatin Sodium 20 Mg Tabs (Pravastatin Sodium) .... Take 1 Tab By Mouth At Bedtime 6)  Allergy Vaccine  1:10 Go (W-E) .... Once Weekly 7)  Zyrtec Allergy 10 Mg Tabs (Cetirizine Hcl) .... Take 1 Tablet By Mouth Once A Day As Needed 8)  Flax Seed Oil 1000 Mg Caps (Flaxseed (Linseed)) .... Take 1 By Mouth Once Daily 9)  Fish Oil 1000 Mg Caps (Omega-3 Fatty Acids) .... Take 1 By Mouth Once Daily 10)  Glucosamine-Chondroitin   Caps (Glucosamine-Chondroit-Vit C-Mn) .... Take 1 Tablet By Mouth Once Daily 11)  Vitamin C 500 Mg  Tabs (Ascorbic Acid) .... Take 1 Tablet By Mouth Once Daily 12)  Calcium-Magnesium-Zinc 333-133-5 Mg Tabs (Calcium-Magnesium-Zinc) .... Take 1 Tablet By Mouth Once Daily 13)   Warfarin Sodium 5 Mg Tabs (Warfarin Sodium) .... Take As Directed 14)  Vicodin Es 7.5-750 Mg Tabs (Hydrocodone-Acetaminophen) .Marland Kitchen.. 1 Tablet Every 6 Hours 15)  Colace 100 Mg Caps (Docusate Sodium) .... As Needed 16)  Dexilant 60 Mg Cpdr (Dexlansoprazole) .... Take 1 Tablet Once Daily 17)  Triamterene-Hctz 37.5-25 Mg Tabs (Triamterene-Hctz) .... Take 1 Tablet Daily 18)  Cyclobenzaprine Hcl 10 Mg Tabs (Cyclobenzaprine Hcl) .... Take 1 Tablet Every 8 Hours  Allergies: No Known Drug Allergies  Past History:  Past Medical History: Last updated: 04/23/2008 PAF/ coumadin Hypertension DJD Allergic rhinitis       Vaccine Hyperlipidemia Sleep Apnea  NPSG 01/25/08 AHI 6.3        CPAP auto- titrated to 10  Past Surgical History: Last updated: 01/31/2008 Septoplasty Total Abdominal Hysterectomy Lipoma  Family History: Last updated: Mar 16, 2008 mother-deceased natural causes, arthritis father-deceased age 57-MI sister-HBP, asthma, allergies  Social History: Last updated: 16-Mar-2008 Patient never smoked.  Pt is married, and has 2 children. Pt is currently employeed in clerical office work. Husband immunosuppressed after heart transplant in 2000  Risk Factors: Smoking Status: never (01/31/2008)  Review of Systems       See history of present illness. She is in obvious pain today and has trouble sitting.  Vital Signs:  Patient profile:   70 year old female Height:      66.5 inches Weight:      190 pounds Pulse rate:   112 / minute Pulse rhythm:  regular BP sitting:   122 / 78  (left arm)  Vitals Entered By: Jacquelin Hawking, CMA (May 21, 2009 8:23 AM)  Physical Exam  General:   Well-nournished,very uncomfortable and in pain, secondary to her back. Neck: No JVD, HJR, Bruit, or thyroid enlargement Lungs: No tachypnea, clear without wheezing, rales, or rhonchi Cardiovascular: RRR, PMI not displaced, heart sounds normal, no murmurs, gallops, bruit, thrill, or  heave. Abdomen: BS normal. Soft without organomegaly, masses, lesions or tenderness. Extremities: without cyanosis, clubbing or edema. Good distal pulses bilateral SKin: Warm, no lesions or rashes  Musculoskeletal: No deformities Neuro: no focal signs    EKG  Procedure date:  05/21/2009  Findings:      sinus tachycardia at 102 beats per minute, otherwise, normal EKG  Impression & Recommendations:  Problem # 1:  PRE-OPERATIVE CARDIOVASCULAR EXAMINATION (ICD-V72.81) I spoke with Dr. [name], who has cleared her for surgery. She has stopped her Coumadin already and is in normal sinus rhythm. Her updated medication list for this problem includes:    Diltiazem Hcl Cr 240 Mg Xr24h-cap (Diltiazem hcl) .Marland Kitchen... Take 1 capsule by mouth once a day    Warfarin Sodium 5 Mg Tabs (Warfarin sodium) .Marland Kitchen... Take as directed  Problem # 2:  ATRIAL FIBRILLATION (ICD-427.31) Patient has tachycardic today, but in sinus rhythm. I believe she is tachycardic secondary to the amount of pain. She is in. She says her heart rate is usually in the 80s, but she is very uncomfortable today. I told her she could take an extra 180 mg of diltiazem when she gets home if her heart rate remains over 100. Her updated medication list for this problem includes:    Warfarin Sodium 5 Mg Tabs (Warfarin sodium) .Marland Kitchen... Take as directed  Problem # 3:  COUMADIN THERAPY (ICD-V58.61) Patient stopped her Coumadin yesterday. She states she may have to be on a baby aspirin, postop because of the spinal fluid leak. She is to contact the Coumadin clinic postop.  Other Orders: EKG w/ Interpretation (93000)  Patient Instructions: 1)  Your physician recommends that you schedule a follow-up appointment in: 1 month with Dr. [name] 2)  Call the Coumadin clinic after surgery.

## 2010-07-07 NOTE — Medication Information (Signed)
Summary: rov/ewj  Anticoagulant Therapy  Managed by: Cloyde Reams, RN, BSN Referring MD: Charlton Haws MD PCP: Ihor Austin MD: Riley Kill MD, Maisie Fus Indication 1: Atrial Fibrillation (ICD-427.31) Lab Used: [facility] Site: [facility] INR POC 1.0 INR RANGE 2 - 3  Dietary changes: no    Health status changes: no    Bleeding/hemorrhagic complications: no    Recent/future hospitalizations: no    Any changes in medication regimen? yes       Details: Pt now taking hydrocodone/apap 5/325mg  2q 6h for pain instead of Tramadol.   Recent/future dental: yes     Details: Pt having to take a steroid shot this afternoon (8/23)  Any missed doses?: yes     Details: Pt stopped her coumadin on Wednesday 8/18 for a steroid shot at 3:45pm this afternoon.  Is patient compliant with meds? yes      Comments: Results sent with patient to MD for epidural today.   Current Medications (verified): 1)  Xalatan 0.005 % Soln (Latanoprost) .... As Directed 2)  Centrum Silver  Tabs (Multiple Vitamins-Minerals) .... Take 1 Tablet By Mouth Once A Day 3)  Diltiazem Hcl Cr 240 Mg Xr24h-Cap (Diltiazem Hcl) .... Take 1 Capsule By Mouth Once A Day 4)  Menostar 14 Mcg/24hr Ptwk (Estradiol) .... Apply Patch Once Weekly 5)  Pravastatin Sodium 20 Mg Tabs (Pravastatin Sodium) .... Take 1 Tab By Mouth At Bedtime 6)  Allergy Vaccine  1:10 Go (W-E) .... Once Weekly 7)  Zyrtec Allergy 10 Mg Tabs (Cetirizine Hcl) .... Take 1 Tablet By Mouth Once A Day As Needed 8)  Flaxseed Oil 1200 Mg Caps (Flaxseed (Linseed)) .... Take 1 Tablet By Mouth Once A Day 9)  Clonazepam 0.5 Mg Tabs (Clonazepam) .Marland Kitchen.. 1 For Sleep If Needed 10)  Prilosec 20 Mg Cpdr (Omeprazole) .... Take 1 Capsule By Mouth Once Daily 11)  Fish Oil 1200 Mg Caps (Omega-3 Fatty Acids) .... Take 1 Capsule By Mouth Once Daily 12)  Red Yeast Rice 600 Mg Caps (Red Yeast Rice Extract) .... Take 2 Capsules By Mouth Once Daily 13)  Glucosamine-Chondroitin   Caps  (Glucosamine-Chondroit-Vit C-Mn) .... Take 1 Tablet By Mouth Once Daily 14)  Vitamin C 500 Mg  Tabs (Ascorbic Acid) .... Take 1 Tablet By Mouth Once Daily 15)  Calcium-Magnesium-Zinc 333-133-5 Mg Tabs (Calcium-Magnesium-Zinc) .... Take 1 Tablet By Mouth Once Daily 16)  Tramadol Hcl 50 Mg Tabs (Tramadol Hcl) .... Take 1 Tablet Every 4 Hours As Needed For Pain 17)  Dilt-Cd 180 Mg Xr24h-Cap (Diltiazem Hcl Coated Beads) .... Take 1 Capsule By Mouth Once A Day 18)  Warfarin Sodium 5 Mg Tabs (Warfarin Sodium) .... Take As Directed  Allergies (verified): No Known Drug Allergies  Anticoagulation Management History:      The patient is taking warfarin and comes in today for a routine follow up visit.  Positive risk factors for bleeding include an age of 56 years or older.  The bleeding index is 'intermediate risk'.  Negative CHADS2 values include Age > 27 years old.  The start date was 12/21/2006.  Anticoagulation responsible provider: Riley Kill MD, Maisie Fus.  INR POC: 1.0.  Cuvette Lot#: [id number].  Exp: 03/2010.    Anticoagulation Management Assessment/Plan:      The patient's current anticoagulation dose is Warfarin sodium 5 mg tabs: Take as directed.  The target INR is 2.0-3.0.  The next INR is due 02/03/2009.  Anticoagulation instructions were given to patient.  Results were reviewed/authorized by Cloyde Reams, RN, BSN.  She was notified by Shelby Dubin PharmD, BCPS, CPP.         Prior Anticoagulation Instructions: INR 2.1  Continue on same dosage 1 tablet on Mondays and Fridays, and 1/2 tablet all other days.  Recheck in 3 weeks.    Current Anticoagulation Instructions: INR 1.0  After injection, take 1.5 tabs for 3 days then resume normal doses of 1 tab on each Monday and Friday and 0.5 tab on all other days.    Recommend discontinuation of red yeast rice because it mimics statin.

## 2010-07-07 NOTE — Miscellaneous (Signed)
  Clinical Lists Changes  Observations: Added new observation of CXR RESULTS:  Clinical Data: Chest pain, soft tissue swelling and hypertension;   assess for pulmonary edema.    CHEST - 2 VIEW    Comparison: Chest radiograph performed 02/21/2009    Findings: The lungs are well-aerated and clear.  There is no   evidence of focal opacification, pleural effusion or pneumothorax.   There is no evidence of pulmonary edema.    The heart is normal in size; the mediastinal contour is within   normal limits.  No acute osseous abnormalities are seen.    IMPRESSION:   No acute cardiopulmonary process seen.    Read By:  Tonia Ghent,  M.D.   Released By:  Tonia Ghent,  M.D.  (05/03/2009 9:26)      CXR  Procedure date:  05/03/2009  Findings:       Clinical Data: Chest pain, soft tissue swelling and hypertension;   assess for pulmonary edema.    CHEST - 2 VIEW    Comparison: Chest radiograph performed 02/21/2009    Findings: The lungs are well-aerated and clear.  There is no   evidence of focal opacification, pleural effusion or pneumothorax.   There is no evidence of pulmonary edema.    The heart is normal in size; the mediastinal contour is within   normal limits.  No acute osseous abnormalities are seen.    IMPRESSION:   No acute cardiopulmonary process seen.    Read By:  Tonia Ghent,  M.D.   Released By:  Tonia Ghent,  M.D.

## 2010-07-07 NOTE — Medication Information (Signed)
Summary: rov/ewj  Anticoagulant Therapy  Managed by: Bethena Midget, RN, BSN Referring MD: Charlton Haws MD PCP: Ihor Austin MD: [name] MD, [name] Indication 1: Atrial Fibrillation (ICD-427.31) Lab Used: [facility] Site: [facility] INR POC 3.1 INR RANGE 2 - 3  Dietary changes: no    Health status changes: no    Bleeding/hemorrhagic complications: no    Recent/future hospitalizations: no    Any changes in medication regimen? yes       Details: did start Pramipexole Dihydroc last week for Restless Leg Syndrome  Recent/future dental: no  Any missed doses?: no       Is patient compliant with meds? yes       Current Medications (verified): 1)  Xalatan 0.005 % Soln (Latanoprost) .... As Directed 2)  Centrum Silver  Tabs (Multiple Vitamins-Minerals) .... Take 1 Tablet By Mouth Once A Day 3)  Diltiazem Hcl Cr 240 Mg Xr24h-Cap (Diltiazem Hcl) .... Take 1 Capsule By Mouth Once A Day 4)  Menostar 14 Mcg/24hr Ptwk (Estradiol) .... Apply Patch Once Weekly 5)  Pravastatin Sodium 20 Mg Tabs (Pravastatin Sodium) .... Take 1 Tab By Mouth At Bedtime 6)  Allergy Vaccine  1:10 Go (W-E) .... Once Weekly 7)  Zyrtec Allergy 10 Mg Tabs (Cetirizine Hcl) .... Take 1 Tablet By Mouth Once A Day As Needed 8)  Flaxseed Oil 1200 Mg Caps (Flaxseed (Linseed)) .... Take 1 Tablet By Mouth Once A Day 9)  Clonazepam 0.5 Mg Tabs (Clonazepam) .Marland Kitchen.. 1 For Sleep If Needed 10)  Prilosec 20 Mg Cpdr (Omeprazole) .... Take 1 Capsule By Mouth Once Daily 11)  Fish Oil 1200 Mg Caps (Omega-3 Fatty Acids) .... Take 1 Capsule By Mouth Once Daily 12)  Glucosamine-Chondroitin   Caps (Glucosamine-Chondroit-Vit C-Mn) .... Take 1 Tablet By Mouth Once Daily 13)  Vitamin C 500 Mg  Tabs (Ascorbic Acid) .... Take 1 Tablet By Mouth Once Daily 14)  Calcium-Magnesium-Zinc 333-133-5 Mg Tabs (Calcium-Magnesium-Zinc) .... Take 1 Tablet By Mouth Once Daily 15)  Dilt-Cd 180 Mg Xr24h-Cap (Diltiazem Hcl Coated Beads) .... Take 1  Capsule By Mouth Once A Day 16)  Warfarin Sodium 5 Mg Tabs (Warfarin Sodium) .... Take As Directed 17)  Vicodin Es 7.5-750 Mg Tabs (Hydrocodone-Acetaminophen) .Marland Kitchen.. 1 Tablet Every 6 Hours 18)  Cyclobenzaprine Hcl 10 Mg Tabs (Cyclobenzaprine Hcl) .... Take 1 Tablet Two Times A Day Prn 19)  Colace 100 Mg Caps (Docusate Sodium) .... As Needed 20)  Mirapex 0.5 Mg Tabs (Pramipexole Dihydrochloride) .... Take 1 Tablet Daily  Allergies (verified): No Known Drug Allergies  Anticoagulation Management History:      The patient is taking warfarin and comes in today for a routine follow up visit.  Positive risk factors for bleeding include an age of 68 years or older.  The bleeding index is 'intermediate risk'.  Negative CHADS2 values include Age > 49 years old.  The start date was 12/21/2006.  Anticoagulation responsible provider: [name] MD, [name].  INR POC: 3.1.  Cuvette Lot#: [id number].  Exp: 04/2010.    Anticoagulation Management Assessment/Plan:      The patient's current anticoagulation dose is Warfarin sodium 5 mg tabs: Take as directed.  The target INR is 2.0-3.0.  The next INR is due 04/14/2009.  Anticoagulation instructions were given to patient.  Results were reviewed/authorized by Bethena Midget, RN, BSN.  She was notified by Bethena Midget, RN, BSN.         Prior Anticoagulation Instructions: INR 2.6  Continue on same dosage 1 tablet  on Mondays and Fridays and 1/2 tablet all other days.  Recheck in 2 weeks.    Current Anticoagulation Instructions: INR 3.1 Today take 2.5mg s then resume 2.5mg s everyday except 5mg s on Mondays and Fridays. Recheck in 3 weeks.

## 2010-07-07 NOTE — Medication Information (Signed)
Summary: [facility]v coumadin - [facility]  Anticoagulant Therapy  Managed by: Weston Brass, PharmD Referring MD: Charlton Haws MD PCP: Ihor Austin MD: Daleen Squibb MD, Maisie Fus Indication 1: Atrial Fibrillation (ICD-427.31) Lab Used: [facility] Site: [facility] INR POC 2.6 INR RANGE 2 - 3  Dietary changes: no    Health status changes: no    Bleeding/hemorrhagic complications: no    Recent/future hospitalizations: no    Any changes in medication regimen? yes       Details: started clonazepam for sleep this week   Recent/future dental: no  Any missed doses?: no       Is patient compliant with meds? yes       Allergies: No Known Drug Allergies  Anticoagulation Management History:      The patient is taking warfarin and comes in today for a routine follow up visit.  Positive risk factors for bleeding include an age of 68 years or older.  The bleeding index is 'intermediate risk'.  Negative CHADS2 values include Age > 61 years old.  The start date was 12/21/2006.  Anticoagulation responsible provider: Daleen Squibb MD, Maisie Fus.  INR POC: 2.6.  Cuvette Lot#: [id number].  Exp: 11/2010.    Anticoagulation Management Assessment/Plan:      The patient's current anticoagulation dose is Warfarin sodium 5 mg tabs: Take as directed.  The target INR is 2.0-3.0.  The next INR is due 10/30/2009.  Anticoagulation instructions were given to patient.  Results were reviewed/authorized by Weston Brass, PharmD.  She was notified by Weston Brass PharmD.         Prior Anticoagulation Instructions: INR 2.4  Coumadin 1 tab = 5mg  on Mon  1/2 tab = 2.5mg  all other days  Current Anticoagulation Instructions: INR 2.6  Continue same dose of 1/2 tablet every day except 1 tablet on Monday

## 2010-07-07 NOTE — Progress Notes (Signed)
Summary: questions about hormone replacement theray and blood clots`  Phone Note Call from Patient Call back at Home Phone [phone]   Caller: Patient Reason for Call: Talk to Nurse Summary of Call: Patient states her primary MD received a letter concerning hormone replacements and blood clots.  Patient would like to know if she should restart hormone replacement meds. Initial call taken by: Burnard Leigh,  September 03, 2009 9:23 AM  Follow-up for Phone Call        spoke with pt, she wants to know if restarting the menostar patches would be okay. she is having bad hot flashes and wants to know if dr [name] thinks it will be alright for her to use the patches. will foward for his review Deliah Goody, RN  September 04, 2009 12:03 PM   Additional Follow-up for Phone Call Additional follow up Details #1::        She is a nonsmoker and on coumadin so I would think the risk of blood clots is low.  Hormone replacement is a decision she needs to make with her OBGYN/Primary and her not me. Additional Follow-up by: Colon Branch, MD, [facility],  September 09, 2009 8:39 AM

## 2010-07-07 NOTE — Assessment & Plan Note (Signed)
Summary: 3 months/apc   PCP:  [facility]  Chief Complaint:  3 MONTH FOLLOW UP/UNABLE TO TOLERATE THE SLEEP MASK.  History of Present Illness: Current Problems:  COUMADIN THERAPY (ICD-V58.61) ATRIAL FIBRILLATION (ICD-427.31) HYPERLIPIDEMIA (ICD-272.4) SLEEP APNEA (ICD-780.57) DEGENERATIVE JOINT DISEASE (ICD-715.90) GLAUCOMA (ICD-365.9) ALLERGIC RHINITIS (ICD-477.9)   01/31/08- 70 year old woman returning for follow-up of allergic rhinitis.  Continues allergy vaccine at 1:10 without problems. Evaluated for paroxysmal atrial fibrillation.   Cardiologist recognized possible sleep apnea. She has been told that her legs jerk in sleep when she is dreaming. NPSG: AHI 6.3, desat nadir 93%, insignificant movement disturbance.  03/04/08- Feels smothered with cpap, too hot. Can't wear cpap and sleep. Turns humidifier off. Stress- husband and work. Will retire in few months. Allergy vaccine ok.  04/04/08- Allergic rhinitis, sleep apnea Rhinitis- continues allergyvaccine with good control. Zyrtec as needed. OSA- Advanced adjusting masks. Autotitrated to cpap 10. Discussed switch to fixed pressure. Has been adjusting well, compliance good.  07/03/08- Allergic rhinitis, sleep apnea Got both flu shots. Feels well.  Can't sleep with cpap- dislikes having something on face. Tried nasal pilows. Does better since she retired. She occasionally cries or pokes her husbnad in her sleep.  Vivid dreams.Autotitration was no more comfortable. Dislikes covers over head- generally claustophobic. No family hx Parkinson's.           Updated Prior Medication List: XALATAN 0.005 % SOLN (LATANOPROST) as directed CENTRUM SILVER  TABS (MULTIPLE VITAMINS-MINERALS) Take 1 tablet by mouth once a day ZEGERID 40-1100 MG CAPS (OMEPRAZOLE-SODIUM BICARBONATE) Take 1 tab by mouth at bedtime COUMADIN 5 MG TABS (WARFARIN SODIUM) as directed DILTIAZEM HCL CR 180 MG XR24H-CAP (DILTIAZEM HCL) Take 1 tablet by mouth once a  day MENOSTAR 14 MCG/24HR PTWK (ESTRADIOL) Apply patch once weekly PRAVASTATIN SODIUM 20 MG TABS (PRAVASTATIN SODIUM) Take 1 tab by mouth at bedtime * ALLERGY VACCINE  1:10 GO (W-E) once weekly ZYRTEC ALLERGY 10 MG TABS (CETIRIZINE HCL) Take 1 tablet by mouth once a day as needed * CPAP 10 ADVANCED  FLAXSEED OIL 1200 MG CAPS (FLAXSEED (LINSEED)) Take 1 tablet by mouth once a day  Current Allergies (reviewed today): No known allergies   Past Medical History:    Reviewed history from 04/23/2008 and no changes required:       PAF/ coumadin       Hypertension       DJD       Allergic rhinitis             Vaccine       Hyperlipidemia       Sleep Apnea  NPSG 01/25/08 AHI 6.3              CPAP auto- titrated to 10  Past Surgical History:    Reviewed history from 01/31/2008 and no changes required:       Septoplasty       Total Abdominal Hysterectomy       Lipoma          Family History:    Reviewed history from 03/04/2008 and no changes required:       mother-deceased natural causes, arthritis       father-deceased age 34-MI       sister-HBP, asthma, allergies  Social History:    Reviewed history from 03/04/2008 and no changes required:       Patient never smoked.        Pt is married, and has 2 children.  Pt is currently employeed in clerical office work.       Husband immunosuppressed after heart transplant in 2000    Review of Systems      See HPI  The patient denies anorexia, fever, weight loss, weight gain, vision loss, decreased hearing, hoarseness, chest pain, syncope, dyspnea on exertion, peripheral edema, prolonged cough, headaches, hemoptysis, abdominal pain, melena, hematochezia, severe indigestion/heartburn, hematuria, incontinence, muscle weakness, suspicious skin lesions, transient blindness, difficulty walking, depression, unusual weight change, abnormal bleeding, enlarged lymph nodes, and angioedema.     Vital Signs:  Patient Profile:   70 Years Old  Female Weight:      197.13 pounds O2 Sat:      100 % O2 treatment:    Room Air Pulse rate:   80 / minute BP sitting:   128 / 78  (left arm) Cuff size:   regular  Vitals Entered By: Marijo File CMA (July 03, 2008 10:40 AM)             Comments Medications reviewed with patient  Marijo File CMA  July 03, 2008 10:40 AM      Physical Exam  General: A/Ox3; pleasant and cooperative, NAD, overweight SKIN: no rash, lesions NODES: no lymphadenopathy HEENT: [place]/AT, EOM- WNL, Conjuctivae- clear, PERRLA, TM-WNL, Nose- turbinate edema, Throat- clear and wnl, melampatti III, no pressure marks  from mask NECK: Supple w/ fair ROM, JVD- none, normal carotid impulses w/o bruits Thyroid- normal to palpation CHEST: Clear to P&A HEART: RRR, no m/g/r heard ABDOMEN: Soft and nl; nml bowel sounds; no organomegaly or masses noted ZOX:WRUE, nl pulses, no edema  NEURO: Grossly intact to observation         Impression & Recommendations:  Problem # 1:  SLEEP APNEA (ICD-780.57) Minimal sleep apnea. Despite her hx AFIb,  benefit does not outweigh her intolerance of cpap. Discussed oral appliances as alternative. She has long palate, but not likely to justify surgery. 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 Will let her try some clonazepam for sedation and muscle relaxation, as discussed. Will d/c cpap  Problem # 2:  ALLERGIC RHINITIS (ICD-477.9) Currently control is good, with Spring coming Her updated medication list for this problem includes:    Zyrtec Allergy 10 Mg Tabs (Cetirizine hcl) .Marland Kitchen... Take 1 tablet by mouth once a day as needed   Medications Added to Medication List This Visit: 1)  Flaxseed Oil 1200 Mg Caps (Flaxseed (linseed)) .... Take 1 tablet by mouth once a day 2)  Clonazepam 0.5 Mg Tabs (Clonazepam) .Marland Kitchen.. 1 for sleep if needed   Patient Instructions: 1)  Please schedule a follow-up appointment in 3  months. 2)  Try clonazepam for awhile, as needed, as a sleeping pill. Notice if it seems to reduce vivid dreaming and movement during sleep.   Prescriptions: CLONAZEPAM 0.5 MG TABS (CLONAZEPAM) 1 for sleep if needed  #20 x 1   Entered and Authorized by:   Waymon Budge MD   Signed by:   Waymon Budge MD on 07/03/2008   Method used:   Print then Give to Patient   RxID:   [id number]

## 2010-07-07 NOTE — Medication Information (Signed)
Summary: rov/tm  Anticoagulant Therapy  Managed by: Elaina Pattee, PharmD Referring MD: Charlton Haws MD PCP: Ihor Austin MD: [name] MD, [name] Indication 1: Atrial Fibrillation (ICD-427.31) Lab Used: [facility] Site: [facility] INR POC 2.6 INR RANGE 2 - 3  Dietary changes: no    Health status changes: no    Bleeding/hemorrhagic complications: no    Recent/future hospitalizations: no    Any changes in medication regimen? no    Recent/future dental: no  Any missed doses?: yes     Details: Patient may have missed 10/31 dose.  Is patient compliant with meds? yes       Allergies: No Known Drug Allergies  Anticoagulation Management History:      The patient is taking warfarin and comes in today for a routine follow up visit.  Positive risk factors for bleeding include an age of 68 years or older.  The bleeding index is 'intermediate risk'.  Negative CHADS2 values include Age > 53 years old.  The start date was 12/21/2006.  Anticoagulation responsible provider: [name] MD, [name].  INR POC: 2.6.  Cuvette Lot#: [id number].  Exp: 03/2010.    Anticoagulation Management Assessment/Plan:      The patient's current anticoagulation dose is Warfarin sodium 5 mg tabs: Take as directed.  The target INR is 2.0-3.0.  The next INR is due 05/13/2009.  Anticoagulation instructions were given to patient.  Results were reviewed/authorized by Elaina Pattee, PharmD.  She was notified by Elaina Pattee, PharmD.         Prior Anticoagulation Instructions: INR 3.1 Today take 2.5mg s then resume 2.5mg s everyday except 5mg s on Mondays and Fridays. Recheck in 3 weeks.   Current Anticoagulation Instructions: INR 2.6. Take 0.5 tablet daily except 1 on Monday and Friday.

## 2010-07-07 NOTE — Medication Information (Signed)
Summary: Anticoagulation clinic visit  Anticoagulant Therapy  Managed by: Shelby Dubin PharmD BCPS CPP PCP: Ihor Austin MD: Dietrich Pates MD  PT 17.5  Dietary changes: no    Health status changes: no    Bleeding/hemorrhagic complications: no    Recent/future hospitalizations: no    Any changes in medication regimen? yes       Details: tramadol 50 mg prn  Recent/future dental: no  Any missed doses?: no       Is patient compliant with meds? yes       Allergies (verified): No Known Drug Allergies  Anticoagulation Management History:      The patient is on coumadin and comes in today for a routine follow up visit.  Positive risk factors for bleeding include an age of 68 years or older.  The bleeding index is 'intermediate risk'.  Negative CHADS2 values include Age > 21 years old.    Anticoagulation Management Assessment/Plan:      The patient's current anticoagulation dose is Coumadin 5 mg tabs: Sunday - 0.5 tab, Monday - 1 tab, Tuesday - 0.5 tab, Wednesday - 0.5 tab, Thursday - 0.5 tab, Friday - 0.5 tab, Saturday - 0.5 tab.  She is to have a 11/22/2008.  Anticoagulation instructions were given to patient.  Results were reviewed/authorized by Shelby Dubin PharmD BCPS CPP.  She was notified by Shelby Dubin PharmD, BCPS, CPP.         Current Anticoagulation Instructions: Take 5 mg warfarin today then as shown:   Coumadin 5 mg tabs: Sunday - 0.5 tab, Monday - 1 tab, Tuesday - 0.5 tab, Wednesday - 0.5 tab, Thursday - 0.5 tab, Friday - 0.5 tab, Saturday - 0.5 tab.    Recheck on 11/22/2008 at 1045 am.

## 2010-07-07 NOTE — Medication Information (Signed)
Summary: rov/tm  Anticoagulant Therapy  Managed by: Eda Keys, PharmD Referring MD: Charlton Haws MD PCP: Ihor Austin MD: Daleen Squibb MD, Maisie Fus Indication 1: Atrial Fibrillation (ICD-427.31) Lab Used: [facility] Site: [facility] INR POC 2.0 INR RANGE 2 - 3  Dietary changes: no    Health status changes: no    Bleeding/hemorrhagic complications: no    Recent/future hospitalizations: no    Any changes in medication regimen? no    Recent/future dental: no  Any missed doses?: no       Is patient compliant with meds? yes       Allergies: No Known Drug Allergies  Anticoagulation Management History:      The patient is taking warfarin and comes in today for a routine follow up visit.  Positive risk factors for bleeding include an age of 68 years or older.  The bleeding index is 'intermediate risk'.  Negative CHADS2 values include Age > 41 years old.  The start date was 12/21/2006.  Anticoagulation responsible provider: Daleen Squibb MD, Maisie Fus.  INR POC: 2.0.  Cuvette Lot#: [id number].  Exp: 10/2010.    Anticoagulation Management Assessment/Plan:      The patient's current anticoagulation dose is Warfarin sodium 5 mg tabs: Take as directed.  The target INR is 2.0-3.0.  The next INR is due 09/04/2009.  Anticoagulation instructions were given to patient.  Results were reviewed/authorized by Eda Keys, PharmD.  She was notified by Eda Keys.         Prior Anticoagulation Instructions: INR 5.3 Skip today's dose and Tuesdays. Then change dose to 2.5mg s everyday. Recheck in 10 days.   Current Anticoagulation Instructions: INR 2.0  Start NEW dosing schedule of 1 tablet on Monday and 1/2 tablet all other days.  Return to clinic in 3 weeks.

## 2010-07-07 NOTE — Medication Information (Signed)
Summary: ccr/jss  Anticoagulant Therapy  Managed by: Bethena Midget, RN, BSN Referring MD: Charlton Haws MD PCP: Ihor Austin MD: Jens Som MD, Arlys [name] Indication 1: Atrial Fibrillation (ICD-427.31) Lab Used: [facility] Site: [facility] INR POC 1.1 INR RANGE 2 - 3  Dietary changes: yes       Details: Appetite poor  Health status changes: no    Bleeding/hemorrhagic complications: no    Recent/future hospitalizations: yes       Details: Discharged on 02/26/09 from [facility] s/p back surgery.   Any changes in medication regimen? yes       Details: Hydrocodone 7.5/750mg s PRN, Cyclobenzapine 10mg s PRN, Colace PRN  Recent/future dental: no  Any missed doses?: no       Is patient compliant with meds? yes      Comments: Was off coumadin for 7-8 day for back surgery on 02/25/09 and restarted coumadin on 02/27/09.   Current Medications (verified): 1)  Xalatan 0.005 % Soln (Latanoprost) .... As Directed 2)  Centrum Silver  Tabs (Multiple Vitamins-Minerals) .... Take 1 Tablet By Mouth Once A Day 3)  Diltiazem Hcl Cr 240 Mg Xr24h-Cap (Diltiazem Hcl) .... Take 1 Capsule By Mouth Once A Day 4)  Menostar 14 Mcg/24hr Ptwk (Estradiol) .... Apply Patch Once Weekly 5)  Pravastatin Sodium 20 Mg Tabs (Pravastatin Sodium) .... Take 1 Tab By Mouth At Bedtime 6)  Allergy Vaccine  1:10 Go (W-E) .... Once Weekly 7)  Zyrtec Allergy 10 Mg Tabs (Cetirizine Hcl) .... Take 1 Tablet By Mouth Once A Day As Needed 8)  Flaxseed Oil 1200 Mg Caps (Flaxseed (Linseed)) .... Take 1 Tablet By Mouth Once A Day 9)  Clonazepam 0.5 Mg Tabs (Clonazepam) .Marland Kitchen.. 1 For Sleep If Needed 10)  Prilosec 20 Mg Cpdr (Omeprazole) .... Take 1 Capsule By Mouth Once Daily 11)  Fish Oil 1200 Mg Caps (Omega-3 Fatty Acids) .... Take 1 Capsule By Mouth Once Daily 12)  Glucosamine-Chondroitin   Caps (Glucosamine-Chondroit-Vit C-Mn) .... Take 1 Tablet By Mouth Once Daily 13)  Vitamin C 500 Mg  Tabs (Ascorbic Acid) .... Take 1 Tablet By Mouth  Once Daily 14)  Calcium-Magnesium-Zinc 333-133-5 Mg Tabs (Calcium-Magnesium-Zinc) .... Take 1 Tablet By Mouth Once Daily 15)  Dilt-Cd 180 Mg Xr24h-Cap (Diltiazem Hcl Coated Beads) .... Take 1 Capsule By Mouth Once A Day 16)  Warfarin Sodium 5 Mg Tabs (Warfarin Sodium) .... Take As Directed 17)  Vicodin Es 7.5-750 Mg Tabs (Hydrocodone-Acetaminophen) .Marland Kitchen.. 1 Tablet Every 6 Hours 18)  Cyclobenzaprine Hcl 10 Mg Tabs (Cyclobenzaprine Hcl) .... Take 1 Tablet Two Times A Day Prn 19)  Colace 100 Mg Caps (Docusate Sodium) .... As Needed  Allergies (verified): No Known Drug Allergies  Anticoagulation Management History:      The patient is taking warfarin and comes in today for a routine follow up visit.  Positive risk factors for bleeding include an age of 15 years or older.  The bleeding index is 'intermediate risk'.  Negative CHADS2 values include Age > 35 years old.  The start date was 12/21/2006.  Anticoagulation responsible provider: Jens Som MD, Arlys [name].  INR POC: 1.1.  Cuvette Lot#: [id number].  Exp: 04/2010.    Anticoagulation Management Assessment/Plan:      The patient's current anticoagulation dose is Warfarin sodium 5 mg tabs: Take as directed.  The target INR is 2.0-3.0.  The next INR is due 03/10/2009.  Anticoagulation instructions were given to patient.  Results were reviewed/authorized by Bethena Midget, RN, BSN.  She was notified by  Bethena Midget, RN, BSN.         Prior Anticoagulation Instructions: INR 3.4   Skip today's dose.Then start 0.5 tablet daily except 1 tablet on Mondays.  Current Anticoagulation Instructions: INR 1.1  2.5mg s everyday except 5mg s on Mondays and Fridays. Recheck in one week.

## 2010-07-07 NOTE — Medication Information (Signed)
Summary: rov/sp  Anticoagulant Therapy  Managed by: Weston Brass, PharmD Referring MD: Charlton Haws MD PCP: Ihor Austin MD: Johney Frame MD, Fayrene Fearing Indication 1: Atrial Fibrillation (ICD-427.31) Lab Used: [facility] Site: [facility] INR POC 2.8 INR RANGE 2 - 3  Dietary changes: no    Health status changes: no    Bleeding/hemorrhagic complications: no    Recent/future hospitalizations: no    Any changes in medication regimen? yes       Details: taking requip daily and changed from zyrtec to allegra   Recent/future dental: no  Any missed doses?: no       Is patient compliant with meds? yes       Allergies: No Known Drug Allergies  Anticoagulation Management History:      The patient is taking warfarin and comes in today for a routine follow up visit.  Positive risk factors for bleeding include an age of 68 years or older.  The bleeding index is 'intermediate risk'.  Negative CHADS2 values include Age > 68 years old.  The start date was 12/21/2006.  Anticoagulation responsible provider: Saylah Ketner MD, Fayrene Fearing.  INR POC: 2.8.  Cuvette Lot#: [id number].  Exp: 01/2011.    Anticoagulation Management Assessment/Plan:      The patient's current anticoagulation dose is Warfarin sodium 5 mg tabs: Take as directed.  The target INR is 2.0-3.0.  The next INR is due 11/26/2009.  Anticoagulation instructions were given to patient.  Results were reviewed/authorized by Weston Brass, PharmD.  She was notified by Weston Brass PharmD.         Prior Anticoagulation Instructions: INR 2.6  Continue same dose of 1/2 tablet every day except 1 tablet on Monday   Current Anticoagulation Instructions: INR 2.8  Continue same dose of 1/2 tablet every day except 1 tablet on Monday.

## 2010-07-07 NOTE — Progress Notes (Signed)
Summary: [facility]   Imported By: Earl Many 01/19/2010 15:39:52  _____________________________________________________________________  External Attachment:    Type:   Image     Comment:   External Document

## 2010-07-07 NOTE — Assessment & Plan Note (Signed)
Summary: 1 YEAR FOLLOW UP///JS   Visit Type:  Follow-up PCP:  Ehinger  Chief Complaint:  Allergic Rhinitis.  History of Present Illness: Current Problems:  DEGENERATIVE JOINT DISEASE (ICD-715.90) GLAUCOMA (ICD-365.9) CAD (ICD-414.00) ALLERGIC RHINITIS (ICD-16.28)  70 year old woman returning for follow-up of allergic rhinitis.  Continues allergy vaccine at 1:10 without problems. Evaluated for paroxysmal atrial fibrillation.  Cardiologist recognized possible sleep apnea. she has been told that her legs jerk in sleep when she is dreaming. NPSG: AHI 6.3, desat nadir 93%, insignificant movement disturbance.           Prior Medications Reviewed Using: List Brought by Patient  Updated Prior Medication List: * ALLERGY VACCINE  1:10 once weekly XALATAN 0.005 % SOLN (LATANOPROST) as directed CENTRUM SILVER  TABS (MULTIPLE VITAMINS-MINERALS) Take 1 tablet by mouth once a day ZEGERID 40-1100 MG CAPS (OMEPRAZOLE-SODIUM BICARBONATE) Take 1 tab by mouth at bedtime COUMADIN 5 MG TABS (WARFARIN SODIUM) as directed DILTIAZEM HCL CR 180 MG XR24H-CAP (DILTIAZEM HCL) Take 1 tablet by mouth once a day MENOSTAR 14 MCG/24HR PTWK (ESTRADIOL) Apply patch once weekly EPIPEN 0.3 MG/0.3ML (1:1000) DEVI (EPINEPHRINE HCL (ANAPHYLAXIS)) as needed PRAVASTATIN SODIUM 20 MG TABS (PRAVASTATIN SODIUM) Take 1 tab by mouth at bedtime ZYRTEC ALLERGY 10 MG TABS (CETIRIZINE HCL) Take 1 tablet by mouth once a day as needed * CPAP AUTOSTART   Current Allergies (reviewed today): No known allergies   Past Medical History:    Reviewed history and no changes required:       PAF/ coumadin       CAD       DJD       Allergic rhinitis       Hyperlipidemia       Sleep Apnea  NPSG 01/25/08 AHI 6.3  Past Surgical History:    Reviewed history and no changes required:       Septoplasty       Total Abdominal Hysterectomy       Lipoma          Family History:    Reviewed history and no changes required:  mother-deceased natural causes       father-deceased age 38-MI       sister-HBP  Social History:    Reviewed history and no changes required:       Patient never smoked.        Pt is married, and has 2 children.       Pt is currently employeed in clerical office work.   Risk Factors:  Tobacco use:  never   Review of Systems       Denies headache, sinus drainage, sneezing chest pain, dyspnea, n/v/d, weight loss, fever, edema.     Vital Signs:  Patient Profile:   70 Years Old Female Weight:      195 pounds O2 Sat:      100 % O2 treatment:    Room Air Pulse rate:   83 / minute BP sitting:   156 / 84  (left arm)  Vitals Entered By: Cloyde Reams RN (January 31, 2008 8:55 AM)             Comments Pt is here today for a 1 year follow-up visit.  Pt had a sleep study done last week at [facility]. Pt having trouble sleeping.  Difficulty staying asleep.  Pt reports kicking, talking, fighting in her sleep. Medications reviewed Cloyde Reams RN  January 31, 2008 9:00 AM      Physical Exam  Mouth:     tonsillar enlargment.   Additional Exam:     General: A/Ox3; pleasant and cooperative, NAD, overweight SKIN: no rash, lesions NODES: no lymphadenopathy HEENT: [place]/AT, EOM- WNL, Conjuctivae- clear, PERRLA, TM-WNL, Nose- turbinate edema, Throat- clear and wnl, melampatti III NECK: Supple w/ fair ROM, JVD- none, normal carotid impulses w/o bruits Thyroid- normal to palpation CHEST: Clear to P&A HEART: RRR, no m/g/r heard ABDOMEN: Soft and nl; nml bowel sounds; no organomegaly or masses noted HYQ:MVHQ, nl pulses, no edema  NEURO: Grossly intact to observation         Impression & Recommendations:  Problem # 1:  SLEEP APNEA (ICD-780.57) Very mild obstructive sleep apnea, index 6.3/hr.  We reviewed medical issues, relation to cardiac problems, and importance of weight control and driving responsibility.  Also, consider the possibility that she has intermittent restless legs or REM  behavior disorder.  She will have family watch for movement issues.  She is interested in trying CPAP.  I also explained availability of oral appliances.  Problem # 2:  ALLERGIC RHINITIS (ICD-477.9) Continue allergy vaccine and environmental precautions.  Symptomatic therapy when needed. Her updated medication list for this problem includes:    Zyrtec Allergy 10 Mg Tabs (Cetirizine hcl) .Marland Kitchen... Take 1 tablet by mouth once a day as needed   Medications Added to Medication List This Visit: 1)  Zegerid 40-1100 Mg Caps (Omeprazole-sodium bicarbonate) .... Take 1 tab by mouth at bedtime 2)  Diltiazem Hcl Cr 180 Mg Xr24h-cap (Diltiazem hcl) .... Take 1 tablet by mouth once a day 3)  Menostar 14 Mcg/24hr Ptwk (Estradiol) .... Apply patch once weekly 4)  Pravastatin Sodium 20 Mg Tabs (Pravastatin sodium) .... Take 1 tab by mouth at bedtime 5)  Zyrtec Allergy 10 Mg Tabs (Cetirizine hcl) .... Take 1 tablet by mouth once a day as needed 6)  Cpap Autostart    Patient Instructions: 1)  Please schedule a follow-up appointment in 1 month. 2)  See pcc to start cpap and discuss phone number to contact Dental faculty at [facility] for information about oral appliances 3)  Continue allergy  vaccine   ]

## 2010-07-07 NOTE — Medication Information (Signed)
Summary: rov/tm  Anticoagulant Therapy  Managed by: Cloyde Reams, RN, BSN Referring MD: Charlton Haws MD PCP: Ihor Austin MD: Riley Kill MD, Maisie Fus Indication 1: Atrial Fibrillation (ICD-427.31) Lab Used: [facility] Site: [facility] INR POC 1.4 INR RANGE 2 - 3  Dietary changes: no    Health status changes: no    Bleeding/hemorrhagic complications: no    Recent/future hospitalizations: yes       Details: Pt was in hospital for back surgery d/c on 06/02/09.  Off coumadin for surgery, resumed 12/28.  Any changes in medication regimen? yes       Details: See med list.  Recent/future dental: no  Any missed doses?: yes     Details: May have missed yesterday's dose of coumadin.    Is patient compliant with meds? yes       Current Medications (verified): 1)  Xalatan 0.005 % Soln (Latanoprost) .... As Directed 2)  Centrum Silver  Tabs (Multiple Vitamins-Minerals) .... Take 1 Tablet By Mouth Once A Day 3)  Diltiazem Hcl Cr 240 Mg Xr24h-Cap (Diltiazem Hcl) .... Take 1 Capsule By Mouth Once A Day 4)  Menostar 14 Mcg/24hr Ptwk (Estradiol) .... Apply Patch Once Weekly 5)  Pravastatin Sodium 20 Mg Tabs (Pravastatin Sodium) .... Take 1 Tab By Mouth At Bedtime 6)  Allergy Vaccine  1:10 Go (W-E) .... Once Weekly 7)  Zyrtec Allergy 10 Mg Tabs (Cetirizine Hcl) .... Take 1 Tablet By Mouth Once A Day As Needed 8)  Flax Seed Oil 1000 Mg Caps (Flaxseed (Linseed)) .... Take 1 By Mouth Once Daily 9)  Fish Oil 1000 Mg Caps (Omega-3 Fatty Acids) .... Take 1 By Mouth Once Daily 10)  Glucosamine-Chondroitin   Caps (Glucosamine-Chondroit-Vit C-Mn) .... Take 1 Tablet By Mouth Once Daily 11)  Vitamin C 500 Mg  Tabs (Ascorbic Acid) .... Take 1 Tablet By Mouth Once Daily 12)  Calcium-Magnesium-Zinc 333-133-5 Mg Tabs (Calcium-Magnesium-Zinc) .... Take 1 Tablet By Mouth Once Daily 13)  Warfarin Sodium 5 Mg Tabs (Warfarin Sodium) .... Take As Directed 14)  Vicodin Es 7.5-750 Mg Tabs  (Hydrocodone-Acetaminophen) .Marland Kitchen.. 1 Tablet Every 6 Hours 15)  Colace 100 Mg Caps (Docusate Sodium) .... As Needed 16)  Dexilant 60 Mg Cpdr (Dexlansoprazole) .... Take 1 Tablet Once Daily 17)  Triamterene-Hctz 37.5-25 Mg Tabs (Triamterene-Hctz) .... Take 1 Tablet Daily 18)  Cyclobenzaprine Hcl 10 Mg Tabs (Cyclobenzaprine Hcl) .... Take 1 Tablet Every 8 Hours  Allergies (verified): No Known Drug Allergies  Anticoagulation Management History:      The patient is taking warfarin and comes in today for a routine follow up visit.  Positive risk factors for bleeding include an age of 68 years or older.  The bleeding index is 'intermediate risk'.  Negative CHADS2 values include Age > 78 years old.  The start date was 12/21/2006.  Anticoagulation responsible provider: Riley Kill MD, Maisie Fus.  INR POC: 1.4.  Cuvette Lot#: [id number].  Exp: 07/2010.    Anticoagulation Management Assessment/Plan:      The patient's current anticoagulation dose is Warfarin sodium 5 mg tabs: Take as directed.  The target INR is 2.0-3.0.  The next INR is due 06/16/2009.  Anticoagulation instructions were given to patient.  Results were reviewed/authorized by Cloyde Reams, RN, BSN.  She was notified by Cloyde Reams RN.         Prior Anticoagulation Instructions: INR 4.2 Skip today's dose then resume 1/2 pill everyday except 1 pill on Mondays and Fridays. Recheck in 2 weeks.   Current Anticoagulation  Instructions: INR 1.4  Take 1.5 tablets today and then 1 tablet tomorrow, then resume same dosage 1/2 tablet daily except 1 tablet on Mondays and Fridays.  Recheck in 1 week.

## 2010-07-07 NOTE — Progress Notes (Signed)
Summary: STOP COUMADIN FOR INJECTION  Medications Added DILT-CD 180 MG XR24H-CAP (DILTIAZEM HCL COATED BEADS) Take 1 capsule by mouth once a day WARFARIN SODIUM 5 MG TABS (WARFARIN SODIUM) Take as directed      Allergies Added: NKDA Phone Note Call from Patient Call back at Home Phone [phone]   Caller: Patient Reason for Call: Talk to Nurse Summary of Call: PATIENT ON COUMADIN.  DR NEWTON NEEDS TO GIVE INJECTION IN SPINE.  NEEDS TO DC COUMADIN ON 12-07-08. Initial call taken by: Burnard Leigh,  December 03, 2008 9:13 AM  Follow-up for Phone Call        Return phone call to patient.  She needs to have injection into spine L4-L5 for pain. Dr. Alvester Morin @ [facility] to do on 12/12/08.  She  will need to stop Coumadin on 12/07/08 and come in for INR on 12/11/08 and take results with her.  She will restart Coumadin same day as injection.  Pt aware Dr [name] will rewiew on Jhonnie Garner and Stanton Kidney will call her back Dennis Bast, RN, BSN  December 03, 2008 10:13 AM [phone]  Lorne Skeens  December 06, 2008 4:07 PM     New/Updated Medications: DILT-CD 180 MG XR24H-CAP (DILTIAZEM HCL COATED BEADS) Take 1 capsule by mouth once a day WARFARIN SODIUM 5 MG TABS (WARFARIN SODIUM) Take as directed

## 2010-07-07 NOTE — Medication Information (Signed)
Summary: rov/cb  Anticoagulant Therapy  Managed by: Bethena Midget, RN, BSN Referring MD: Charlton Haws MD PCP: Ihor Austin MD: Clifton James MD, Cristal Deer Indication 1: Atrial Fibrillation (ICD-427.31) Lab Used: [facility]  Site: [facility] INR POC 4.2 INR RANGE 2 - 3  Dietary changes: yes       Details: eating a little less green vegetables.  Health status changes: no    Bleeding/hemorrhagic complications: no    Recent/future hospitalizations: no    Any changes in medication regimen? yes       Details: Prednisone dose pk, celebrex, dexilant, Triameterene/hctz  Recent/future dental: no  Any missed doses?: no       Is patient compliant with meds? yes      Comments: Aggravated Rt. side of back during physical Therapy s/p surgery on lt. side of back from ruptured herniated disc.   Current Medications (verified): 1)  Xalatan 0.005 % Soln (Latanoprost) .... As Directed 2)  Centrum Silver  Tabs (Multiple Vitamins-Minerals) .... Take 1 Tablet By Mouth Once A Day 3)  Diltiazem Hcl Cr 240 Mg Xr24h-Cap (Diltiazem Hcl) .... Take 1 Capsule By Mouth Once A Day 4)  Menostar 14 Mcg/24hr Ptwk (Estradiol) .... Apply Patch Once Weekly 5)  Pravastatin Sodium 20 Mg Tabs (Pravastatin Sodium) .... Take 1 Tab By Mouth At Bedtime 6)  Allergy Vaccine  1:10 Go (W-E) .... Once Weekly 7)  Zyrtec Allergy 10 Mg Tabs (Cetirizine Hcl) .... Take 1 Tablet By Mouth Once A Day As Needed 8)  Flax Seed Oil 1000 Mg Caps (Flaxseed (Linseed)) .... Take 1 By Mouth Once Daily 9)  Prilosec 20 Mg Cpdr (Omeprazole) .... Take 1 Capsule By Mouth Once Daily 10)  Fish Oil 1000 Mg Caps (Omega-3 Fatty Acids) .... Take 1 By Mouth Once Daily 11)  Glucosamine-Chondroitin   Caps (Glucosamine-Chondroit-Vit C-Mn) .... Take 1 Tablet By Mouth Once Daily 12)  Vitamin C 500 Mg  Tabs (Ascorbic Acid) .... Take 1 Tablet By Mouth Once Daily 13)  Calcium-Magnesium-Zinc 333-133-5 Mg Tabs (Calcium-Magnesium-Zinc) .... Take 1 Tablet By  Mouth Once Daily 14)  Warfarin Sodium 5 Mg Tabs (Warfarin Sodium) .... Take As Directed 15)  Vicodin Es 7.5-750 Mg Tabs (Hydrocodone-Acetaminophen) .Marland Kitchen.. 1 Tablet Every 6 Hours 16)  Colace 100 Mg Caps (Docusate Sodium) .... As Needed 17)  Mirapex 0.5 Mg Tabs (Pramipexole Dihydrochloride) .... Take 1 Tablet Daily 18)  Dexilant 60 Mg Cpdr (Dexlansoprazole) .... Take 1 Tablet Once Daily 19)  Triamterene-Hctz 37.5-25 Mg Tabs (Triamterene-Hctz) .... Take 1 Tablet Daily 20)  Celebrex 200 Mg Caps (Celecoxib) .... Take 1 Tablet Two Times A Day 21)  Cyclobenzaprine Hcl 10 Mg Tabs (Cyclobenzaprine Hcl) .... Take 1 Tablet Every 8 Hours 22)  Methylprednisolone 4 Mg Tabs (Methylprednisolone) .... Tapering Dose Pack  Allergies (verified): No Known Drug Allergies  Anticoagulation Management History:      The patient is taking warfarin and comes in today for a routine follow up visit.  Positive risk factors for bleeding include an age of 68 years or older.  The bleeding index is 'intermediate risk'.  Negative CHADS2 values include Age > 68 years old.  The start date was 12/21/2006.  Anticoagulation responsible provider: Clifton James MD, Cristal Deer.  INR POC: 4.2.  Cuvette Lot#: [id number].  Exp: 07/2010.    Anticoagulation Management Assessment/Plan:      The patient's current anticoagulation dose is Warfarin sodium 5 mg tabs: Take as directed.  The target INR is 2.0-3.0.  The next INR is due 05/27/2009.  Anticoagulation  instructions were given to patient.  Results were reviewed/authorized by Bethena Midget, RN, BSN.  She was notified by Bethena Midget, RN, BSN.         Prior Anticoagulation Instructions: INR 2.6. Take 0.5 tablet daily except 1 on Monday and Friday.  Current Anticoagulation Instructions: INR 4.2 Skip today's dose then resume 1/2 pill everyday except 1 pill on Mondays and Fridays. Recheck in 2 weeks.

## 2010-07-07 NOTE — Assessment & Plan Note (Signed)
Summary: follow up/ mbw   PCP:  Ehinger  Chief Complaint:  follow up - had cpap download last week.  History of Present Illness: Current Problems:  HYPERLIPIDEMIA (ICD-272.4) SLEEP APNEA (ICD-780.57) DEGENERATIVE JOINT DISEASE (ICD-715.90) GLAUCOMA (ICD-365.9) CAD (ICD-414.00) ALLERGIC RHINITIS (ICD-477.9)   01/31/08- 70 year old woman returning for follow-up of allergic rhinitis.  Continues allergy vaccine at 1:10 without problems. Evaluated for paroxysmal atrial fibrillation.   Cardiologist recognized possible sleep apnea. She has been told that her legs jerk in sleep when she is dreaming. NPSG: AHI 6.3, desat nadir 93%, insignificant movement disturbance.  03/04/08- Feels smothered with cpap, too hot. Can't wear cpap and sleep. Turns humidifier off. Stress- husband and work. Will retire in few months. Allergy vaccine ok.  04/04/08- Allergic rhinitis, sleep apnea Rhinitis- continues allergyvaccine withgoodcontrol. Zyrtec as needed. OSA- Advanced adjusting masks. Autotitrated to cpap 10. Discussed switch to fixed pressure. Has been adjusting well, compliance good.         Prior Medications Reviewed Using: List Brought by Patient  Updated Prior Medication List: * ALLERGY VACCINE  1:10 once weekly XALATAN 0.005 % SOLN (LATANOPROST) as directed CENTRUM SILVER  TABS (MULTIPLE VITAMINS-MINERALS) Take 1 tablet by mouth once a day ZEGERID 40-1100 MG CAPS (OMEPRAZOLE-SODIUM BICARBONATE) Take 1 tab by mouth at bedtime COUMADIN 5 MG TABS (WARFARIN SODIUM) as directed DILTIAZEM HCL CR 180 MG XR24H-CAP (DILTIAZEM HCL) Take 1 tablet by mouth once a day MENOSTAR 14 MCG/24HR PTWK (ESTRADIOL) Apply patch once weekly PRAVASTATIN SODIUM 20 MG TABS (PRAVASTATIN SODIUM) Take 1 tab by mouth at bedtime ZYRTEC ALLERGY 10 MG TABS (CETIRIZINE HCL) Take 1 tablet by mouth once a day as needed * CPAP AUTOSTART   Current Allergies (reviewed today): No known allergies   Past Medical History:   Reviewed history from 03/04/2008 and no changes required:       PAF/ coumadin       CAD       DJD       Allergic rhinitis             Vaccine       Hyperlipidemia       Sleep Apnea  NPSG 01/25/08 AHI 6.3              CPAP auto- titrated to 10   Social History:    Reviewed history from 03/04/2008 and no changes required:       Patient never smoked.        Pt is married, and has 2 children.       Pt is currently employeed in clerical office work.       Husband immunosuppressed after heart transplant in 2000    Review of Systems      See HPI   Vital Signs:  Patient Profile:   70 Years Old Female Weight:      200.38 pounds O2 Sat:      99 % O2 treatment:    Room Air Pulse rate:   73 / minute BP sitting:   130 / 80  (left arm) Cuff size:   regular  Vitals Entered By: Boone Master CNA (April 04, 2008 4:02 PM)             Is Patient Diabetic? No Comments Medications reviewed with patient Boone Master CNA  April 04, 2008 4:03 PM      Physical Exam  General: A/Ox3; pleasant and cooperative, NAD, overweight SKIN: no rash, lesions NODES: no lymphadenopathy HEENT: [place]/AT, EOM- WNL, Conjuctivae- clear,  PERRLA, TM-WNL, Nose- turbinate edema, Throat- clear and wnl, melampatti III, no pressure marks  from mask NECK: Supple w/ fair ROM, JVD- none, normal carotid impulses w/o bruits Thyroid- normal to palpation CHEST: Clear to P&A HEART: RRR, no m/g/r heard ABDOMEN: Soft and nl; nml bowel sounds; no organomegaly or masses noted KVQ:QVZD, nl pulses, no edema  NEURO: Grossly intact to observation         Impression & Recommendations:  Problem # 1:  SLEEP APNEA (ICD-780.57) Ready for switch tofixed cpap at 10  Problem # 2:  ALLERGIC RHINITIS (ICD-477.9) Assessment: Improved Continue vaccine and environmental precautions. Her updated medication list for this problem includes:    Zyrtec Allergy 10 Mg Tabs (Cetirizine hcl) .Marland Kitchen... Take 1 tablet by mouth once a day  as needed   Medications Added to Medication List This Visit: 1)  Allergy Vaccine 1:10 Go (w-e)  .... Once weekly 2)  Cpap 10 Advanced    Patient Instructions: 1)  Please schedule a follow-up appointment in 3 months. 2)  Call if needed- feel especially free to call the DME company about mechanical and comfort issues. 3)  We are going to try switching your cpap to fixed pressure at 10 cwp   ]

## 2010-07-07 NOTE — Progress Notes (Signed)
Summary: coumadin/back surgery  Phone Note Call from Patient Call back at Home Phone [phone]   Caller: Patient Summary of Call: pt needs to come off coumadin for back surgery, when will she be able to start back? Initial call taken by: Migdalia Dk,  February 19, 2009 4:31 PM  Follow-up for Phone Call        Pt states she stopped coumadin last night, doesn't have date for back surgery yet, but pt is in a lot of pain and they are going to schedule ASAP.  Advised pt surgeon will instruct he when she can resume coumadin.  Will forward message to Dr [name] to make aware.  Pt unsure if surgical clearance given by Dr [name]. Follow-up by: Cloyde Reams RN,  February 20, 2009 9:19 AM  Additional Follow-up for Phone Call Additional follow up Details #1::        When she starts back up with coumadin is up to the surgeon not [place].  Needs to see coumadin clinic 5 days after restarting

## 2010-07-07 NOTE — Medication Information (Signed)
Summary: [name]  Anticoagulant Therapy  Managed by: Bethena Midget, RN, BSN Referring MD: Charlton Haws MD PCP: Ihor Austin MD: Shirlee Latch MD, Robertt Buda Indication 1: Atrial Fibrillation (ICD-427.31) Lab Used: [facility] Site: [facility] INR POC 5.3 INR RANGE 2 - 3  Dietary changes: yes       Details: Sl. less green veggies  Health status changes: no    Bleeding/hemorrhagic complications: yes       Details: left forearm accidently hit on chair railing, broke skin.   Recent/future hospitalizations: no    Any changes in medication regimen? yes       Details: Started Prednisone on 07/23/09 for 6 day tapering dose.   Recent/future dental: no  Any missed doses?: no       Is patient compliant with meds? yes       Allergies: No Known Drug Allergies  Anticoagulation Management History:      The patient is taking warfarin and comes in today for a routine follow up visit.  Positive risk factors for bleeding include an age of 68 years or older.  The bleeding index is 'intermediate risk'.  Negative CHADS2 values include Age > 16 years old.  The start date was 12/21/2006.  Anticoagulation responsible provider: Shirlee Latch MD, Markelle Asaro.  INR POC: 5.3.  Cuvette Lot#: [id number].  Exp: 09/2010.    Anticoagulation Management Assessment/Plan:      The patient's current anticoagulation dose is Warfarin sodium 5 mg tabs: Take as directed.  The target INR is 2.0-3.0.  The next INR is due 08/14/2009.  Anticoagulation instructions were given to patient.  Results were reviewed/authorized by Bethena Midget, RN, BSN.  She was notified by Bethena Midget, RN, BSN.         Prior Anticoagulation Instructions: INR 3.0  Take 1/2 tablet today then resume same dosage 1/2 tablet daily except 1 tablet on Mondays and Fridays.  Recheck in 3 weeks.    Current Anticoagulation Instructions: INR 5.3 Skip today's dose and Tuesdays. Then change dose to 2.5mg s everyday. Recheck in 10 days.

## 2010-07-07 NOTE — Medication Information (Signed)
Summary: rov.mp  Anticoagulant Therapy  Managed by: Shelby Dubin, PharmD, BCPS, CPP Referring MD: Charlton Haws MD PCP: Ihor Austin MD: Antoine Poche MD, Fayrene Fearing Indication 1: Atrial Fibrillation (ICD-427.31) Lab Used: [facility] Site: [facility] INR POC 3.4 INR RANGE 2 - 3  Dietary changes: no    Health status changes: no    Bleeding/hemorrhagic complications: no    Recent/future hospitalizations: no    Any changes in medication regimen? yes       Details: Pt now taking Hydrocodone/APAP 7.5/750mg  q6h since last Friday (8/27)  Recent/future dental: no  Any missed doses?: no       Is patient compliant with meds? yes       Allergies (verified): No Known Drug Allergies  Anticoagulation Management History:      The patient is taking warfarin and comes in today for a routine follow up visit.  Positive risk factors for bleeding include an age of 68 years or older.  The bleeding index is 'intermediate risk'.  Negative CHADS2 values include Age > 44 years old.  The start date was 12/21/2006.  Anticoagulation responsible provider: Antoine Poche MD, Fayrene Fearing.  INR POC: 3.4.  Cuvette Lot#: [id number].  Exp: 03/2010.    Anticoagulation Management Assessment/Plan:      The patient's current anticoagulation dose is Warfarin sodium 5 mg tabs: Take as directed.  The target INR is 2.0-3.0.  The next INR is due 02/19/2009.  Anticoagulation instructions were given to patient.  Results were reviewed/authorized by Shelby Dubin, PharmD, BCPS, CPP.  She was notified by Windell Moulding, PharmD, BCPS.         Prior Anticoagulation Instructions: INR 1.0  After injection, take 1.5 tabs for 3 days then resume normal doses of 1 tab on each Monday and Friday and 0.5 tab on all other days.    Recommend discontinuation of red yeast rice because it mimics statin.    Current Anticoagulation Instructions: INR 3.4   Skip today's dose.Then start 0.5 tablet daily except 1 tablet on Mondays. Prescriptions: VICODIN  ES 7.5-750 MG TABS (HYDROCODONE-ACETAMINOPHEN) 1 tablet every 6 hours  #30 x 0   Entered by:   Shelby Dubin PharmD, BCPS, CPP   Authorized by:   Marland Kitchen LB [facility]   Signed by:   Shelby Dubin PharmD, BCPS, CPP on 02/04/2009   Method used:   Historical   RxID:   [id number]

## 2010-07-07 NOTE — Medication Information (Signed)
Summary: rov/ewj  Anticoagulant Therapy  Managed by: Bethena Midget, RN, BSN Referring MD: Charlton Haws MD PCP: Ihor Austin MD: Tenny Craw MD, Gunnar Fusi Indication 1: Atrial Fibrillation (ICD-427.31) Lab Used: [facility] Site: [facility] INR POC 2.8 INR RANGE 2 - 3  Dietary changes: no    Health status changes: no    Bleeding/hemorrhagic complications: no    Recent/future hospitalizations: no    Any changes in medication regimen? yes       Details: Discontinued use of Cyclobenaprine. Still using Hydrocodone PRN. Lasix added last week on 06/12/09 for swelling in bil. feet.   Recent/future dental: no  Any missed doses?: no       Is patient compliant with meds? yes       Allergies: No Known Drug Allergies  Anticoagulation Management History:      The patient is taking warfarin and comes in today for a routine follow up visit.  Positive risk factors for bleeding include an age of 68 years or older.  The bleeding index is 'intermediate risk'.  Negative CHADS2 values include Age > 68 years old.  The start date was 12/21/2006.  Anticoagulation responsible provider: Tenny Craw MD, Gunnar Fusi.  INR POC: 2.8.  Cuvette Lot#: [id number].  Exp: 07/2010.    Anticoagulation Management Assessment/Plan:      The patient's current anticoagulation dose is Warfarin sodium 5 mg tabs: Take as directed.  The target INR is 2.0-3.0.  The next INR is due 06/30/2009.  Anticoagulation instructions were given to patient.  Results were reviewed/authorized by Bethena Midget, RN, BSN.  She was notified by Bethena Midget, RN, BSN.         Prior Anticoagulation Instructions: INR 1.4  Take 1.5 tablets today and then 1 tablet tomorrow, then resume same dosage 1/2 tablet daily except 1 tablet on Mondays and Fridays.  Recheck in 1 week.    Current Anticoagulation Instructions: INR 2.8 Continue 2.5mg s everyday except 5mg s on Mondays and Fridays. Recheck INR in 2 weeks.

## 2010-07-07 NOTE — Progress Notes (Signed)
  Phone Note Other Incoming   Caller: Martyn Ehrich Reason for Call: Get patient information Action Taken: Information Sent Initial call taken by: Denny Peon    Faxed LOV & EKG over to [facility] to fax [phone] [facility]  May 26, 2009 8:53 AM

## 2010-07-07 NOTE — Medication Information (Signed)
Summary: ROV  Anticoagulant Therapy  Managed by: Weston Brass, PharmD Referring MD: Charlton Haws MD PCP: Ihor Austin MD: Dietrich Pates MD Indication 1: Atrial Fibrillation (ICD-427.31) Lab Used: [facility] 18.4 INR POC 2.3  Dietary changes: no    Health status changes: no    Bleeding/hemorrhagic complications: yes       Details: [place]  Went to urgent care and everything was okay.  Eardrum was intact.  No complaints of hearing loss   Recent/future hospitalizations: yes       Details: Going to orthopedist today for possible spinal injection in next few weeks.  Instructed to pt to call if procedure planned so we could okay with Dr. [name]  Any changes in medication regimen? no    Recent/future dental: no  Any missed doses?: no       Is patient compliant with meds? yes       Current Medications (verified): 1)  Xalatan 0.005 % Soln (Latanoprost) .... As Directed 2)  Centrum Silver  Tabs (Multiple Vitamins-Minerals) .... Take 1 Tablet By Mouth Once A Day 3)  Diltiazem Hcl Cr 180 Mg Xr24h-Cap (Diltiazem Hcl) .... Take 1 Tablet By Mouth Once A Day 4)  Menostar 14 Mcg/24hr Ptwk (Estradiol) .... Apply Patch Once Weekly 5)  Pravastatin Sodium 20 Mg Tabs (Pravastatin Sodium) .... Take 1 Tab By Mouth At Bedtime 6)  Allergy Vaccine  1:10 Go (W-E) .... Once Weekly 7)  Zyrtec Allergy 10 Mg Tabs (Cetirizine Hcl) .... Take 1 Tablet By Mouth Once A Day As Needed 8)  Flaxseed Oil 1200 Mg Caps (Flaxseed (Linseed)) .... Take 1 Tablet By Mouth Once A Day 9)  Clonazepam 0.5 Mg Tabs (Clonazepam) .Marland Kitchen.. 1 For Sleep If Needed 10)  Coumadin 5 Mg Tabs (Warfarin Sodium) .... Take As Directed By Anticoagulation Clinic 11)  Prilosec 20 Mg Cpdr (Omeprazole) .... Take 1 Capsule By Mouth Once Daily 12)  Fish Oil 1200 Mg Caps (Omega-3 Fatty Acids) .... Take 1 Capsule By Mouth Once Daily 13)  Red Yeast Rice 600 Mg Caps (Red Yeast Rice Extract) .... Take 2 Capsules By Mouth  Once Daily 14)  Glucosamine-Chondroitin   Caps (Glucosamine-Chondroit-Vit C-Mn) .... Take 1 Tablet By Mouth Once Daily 15)  Vitamin C 500 Mg  Tabs (Ascorbic Acid) .... Take 1 Tablet By Mouth Once Daily 16)  Calcium-Magnesium-Zinc 333-133-5 Mg Tabs (Calcium-Magnesium-Zinc) .... Take 1 Tablet By Mouth Once Daily 17)  Tramadol Hcl 50 Mg Tabs (Tramadol Hcl) .... Take 1 Tablet Every 4 Hours As Needed For Pain  Allergies (verified): No Known Drug Allergies  Anticoagulation Management History:      The patient is on coumadin and comes in today for a routine follow up visit.  Positive risk factors for bleeding include an age of 68 years or older.  The bleeding index is 'intermediate risk'.  Negative CHADS2 values include Age > 59 years old.  The start date was 12/21/2006.    Anticoagulation Management Assessment/Plan:      The patient's current anticoagulation dose is Coumadin 5 mg tabs: Take as directed by Anticoagulation Clinic.  The target INR is 2.0-3.0.  She is to have a 12/20/2008.  Anticoagulation instructions were given to patient.  Results were reviewed/authorized by Weston Brass, PharmD.  She was notified by Weston Brass PharmD.         Prior Anticoagulation Instructions: Take 5 mg warfarin today then as shown:   Coumadin 5 mg tabs: Sunday -  0.5 tab, Monday - 1 tab, Tuesday - 0.5 tab, Wednesday - 0.5 tab, Thursday - 0.5 tab, Friday - 0.5 tab, Saturday - 0.5 tab.    Recheck on 11/22/2008 at 1045 am.   Current Anticoagulation Instructions: INR 2.3  The patient is to continue with the same dose of coumadin.  This dosage includes: 2.5mg  (1/2 tablet) everyday except 5mg  (1 tablet) on Mondays

## 2010-07-07 NOTE — Assessment & Plan Note (Signed)
Summary: 6 months/ mbw   Primary Provider/Referring Provider:  Manus Gunning  CC:  6 month follow up visit.  History of Present Illness: 04/04/08- Allergic rhinitis, sleep apnea Rhinitis- continues allergy vaccine with good control. Zyrtec as needed. OSA- Advanced adjusting masks. Autotitrated to cpap 10. Discussed switch to fixed pressure. Has been adjusting well, compliance good.  07/03/08- Allergic rhinitis, sleep apnea Got both flu shots. Feels well.  Can't sleep with cpap- dislikes having something on face. Tried nasal pilows. Does better since she retired. She occasionally cries or pokes her husbnad in her sleep.  Vivid dreams.Autotitration was no more comfortable. Dislikes covers over head- generally claustophobic. No family hx Parkinson's.  10/01/08 Allergic rhinits, sleep apnea/ insomnia   Quit cpap Very good spring pollen season so far. She feels she is doing fine with allergy vaccine and zyrtec. She quit work and can now sleep in more.. Husband says she isn't "fighting" much in sleep.  Clonazepam didn't help consolidate sleep and she blames husband for wanting to keep bedrroom too warm.  March 31, 2009- Allergic rhinitis, sleep apnea/ insomnia  Quit cpap Had no respiratory complications during Lumbar disc surgery 5 weeks ago and recovering gradually.  Had flu vax. Taking zyrtec to keep "allergy" watery rhinorhea controled. Denies purulent/  bloody, cough or fever. No GI distress.    Current Medications (verified): 1)  Xalatan 0.005 % Soln (Latanoprost) .... As Directed 2)  Centrum Silver  Tabs (Multiple Vitamins-Minerals) .... Take 1 Tablet By Mouth Once A Day 3)  Diltiazem Hcl Cr 240 Mg Xr24h-Cap (Diltiazem Hcl) .... Take 1 Capsule By Mouth Once A Day 4)  Menostar 14 Mcg/24hr Ptwk (Estradiol) .... Apply Patch Once Weekly 5)  Pravastatin Sodium 20 Mg Tabs (Pravastatin Sodium) .... Take 1 Tab By Mouth At Bedtime 6)  Allergy Vaccine  1:10 Go (W-E) .... Once Weekly 7)  Zyrtec  Allergy 10 Mg Tabs (Cetirizine Hcl) .... Take 1 Tablet By Mouth Once A Day As Needed 8)  Flax Seed Oil 1000 Mg Caps (Flaxseed (Linseed)) .... Take 1 By Mouth Once Daily 9)  Prilosec 20 Mg Cpdr (Omeprazole) .... Take 1 Capsule By Mouth Once Daily 10)  Fish Oil 1000 Mg Caps (Omega-3 Fatty Acids) .... Take 1 By Mouth Once Daily 11)  Glucosamine-Chondroitin   Caps (Glucosamine-Chondroit-Vit C-Mn) .... Take 1 Tablet By Mouth Once Daily 12)  Vitamin C 500 Mg  Tabs (Ascorbic Acid) .... Take 1 Tablet By Mouth Once Daily 13)  Calcium-Magnesium-Zinc 333-133-5 Mg Tabs (Calcium-Magnesium-Zinc) .... Take 1 Tablet By Mouth Once Daily 14)  Warfarin Sodium 5 Mg Tabs (Warfarin Sodium) .... Take As Directed 15)  Vicodin Es 7.5-750 Mg Tabs (Hydrocodone-Acetaminophen) .Marland Kitchen.. 1 Tablet Every 6 Hours 16)  Colace 100 Mg Caps (Docusate Sodium) .... As Needed 17)  Mirapex 0.5 Mg Tabs (Pramipexole Dihydrochloride) .... Take 1 Tablet Daily  Allergies (verified): No Known Drug Allergies  Past History:  Past Medical History: Last updated: 04/23/2008 PAF/ coumadin Hypertension DJD Allergic rhinitis       Vaccine Hyperlipidemia Sleep Apnea  NPSG 01/25/08 AHI 6.3        CPAP auto- titrated to 10  Past Surgical History: Last updated: 01/31/2008 Septoplasty Total Abdominal Hysterectomy Lipoma  Family History: Last updated: 2008/03/24 mother-deceased natural causes, arthritis father-deceased age 15-MI sister-HBP, asthma, allergies  Social History: Last updated: 03/24/2008 Patient never smoked.  Pt is married, and has 2 children. Pt is currently employeed in clerical office work. Husband immunosuppressed after heart transplant in 2000  Risk  Factors: Smoking Status: never (01/31/2008)  Review of Systems      See HPI  The patient denies anorexia, fever, weight loss, weight gain, vision loss, decreased hearing, hoarseness, chest pain, syncope, dyspnea on exertion, peripheral edema, prolonged cough,  headaches, hemoptysis, abdominal pain, melena, hematochezia, and severe indigestion/heartburn.    Vital Signs:  Patient profile:   70 year old female Height:      66.5 inches Weight:      192.50 pounds BMI:     30.72 O2 Sat:      98 % on Room air Pulse rate:   109 / minute BP sitting:   172 / 94  (right arm) Cuff size:   regular  Vitals Entered By: Reynaldo Minium CMA (March 31, 2009 9:58 AM)  O2 Flow:  Room air  Physical Exam  Additional Exam:  General: A/Ox3; pleasant and cooperative, NAD, overweight SKIN: no rash, lesions NODES: no lymphadenopathy HEENT: /AT, EOM- WNL, Conjuctivae- clear, PERRLA, TM-WNL, Nose- turbinate edema, Throat- clear and wnl, melampatti III,   NECK: Supple w/ fair ROM, JVD- none, normal carotid impulses w/o bruits Thyroid-  CHEST: Clear to P&A HEART: RRR, no m/g/r heard ABDOMEN: Soft and nl;  ION:GEXB, nl pulses, no edema  NEURO: Grossly intact to observation      Impression & Recommendations:  Problem # 1:  SLEEP APNEA (ICD-780.57)  Long palate suggests at risk body type for apnea,  but she doesn't feel concerned now. i discussed the medical issues. She is alone at night with no one to report snoring.  Problem # 2:  ALLERGIC RHINITIS (ICD-477.9)  She continues allergy shots- "doing fine", and takes Zyrtec when needed. No changes needed now. Her updated medication list for this problem includes:    Zyrtec Allergy 10 Mg Tabs (Cetirizine hcl) .Marland Kitchen... Take 1 tablet by mouth once a day as needed  Medications Added to Medication List This Visit: 1)  Flax Seed Oil 1000 Mg Caps (Flaxseed (linseed)) .... Take 1 by mouth once daily 2)  Fish Oil 1000 Mg Caps (Omega-3 fatty acids) .... Take 1 by mouth once daily  Other Orders: Est. Patient Level III (28413)  Patient Instructions: 1)  Please schedule a follow-up appointment in 6 months.

## 2010-07-07 NOTE — Progress Notes (Signed)
Summary: NEED ANTIBIOTIC  Phone Note Call from Patient   Caller: Patient Call For: YOUNG Summary of Call: WOULD LIKE ANTIBIOTIC CALL TO PHARMACY [facility] PATIENT'S CH[facility] HAS BEEN REQUESTED  Initial call taken by: Rickard Patience,  June 09, 2007 9:02 AM  Follow-up for Phone Call        Pt is c/o productive cough with green sputum x 3 days with chest congestion and runny nose.  She would like abx callled to [facility].  Please advise. Follow-up by: Vernie Murders,  June 09, 2007 11:29 AM  Additional Follow-up for Phone Call Additional follow up Details #1::        1) okay z pak to her drug store 2) please pull chart for me to  preload Additional Follow-up by: Waymon Budge MD,  June 09, 2007 12:17 PM    Additional Follow-up for Phone Call Additional follow up Details #2::    Rx sent to pharmacy electronically, per md request. Follow-up by: Jerolyn Shin,  June 09, 2007 12:24 PM  New/Updated Medications: ZITHROMAX Z-PAK 250 MG  TABS (AZITHROMYCIN) Use as directed   Prescriptions: ZITHROMAX Z-PAK 250 MG  TABS (AZITHROMYCIN) Use as directed  #1 x 0   Entered by:   Jerolyn Shin   Authorized by:   Waymon Budge MD   Signed by:   Jerolyn Shin on 06/09/2007   Method used:   Electronically sent to ...       [facility]*       [address] [place], [place]  [address]       Ph: [phone] or [phone]       Fax: [phone]   RxID:   [id number]

## 2010-07-07 NOTE — Medication Information (Signed)
Summary: rov/sp  Anticoagulant Therapy  Managed by: Elaina Pattee, PharmD Referring MD: Charlton Haws MD PCP: Ihor Austin MD: [name] MD, [name] Indication 1: Atrial Fibrillation (ICD-427.31) Lab Used: [facility] Site: [facility] INR POC 2.9 INR RANGE 2 - 3  Dietary changes: no    Health status changes: no    Bleeding/hemorrhagic complications: no    Recent/future hospitalizations: yes       Details: Cataract surgery 2 weeks ago. Will be repeating in 2 weeks.  Any changes in medication regimen? yes       Details: Eye drops added from cataract surgery: Combigan and Zymox (now DC), Bromday and Pred Forte continue (up to 2 weeks).  Recent/future dental: no  Any missed doses?: no       Is patient compliant with meds? yes       Current Medications (verified): 1)  Xalatan 0.005 % Soln (Latanoprost) .... As Directed 2)  Centrum Silver  Tabs (Multiple Vitamins-Minerals) .... Take 1 Tablet By Mouth Once A Day 3)  Diltiazem Hcl Cr 240 Mg Xr24h-Cap (Diltiazem Hcl) .... Take 1 Capsule By Mouth Once A Day 4)  Menostar 14 Mcg/24hr Ptwk (Estradiol) .... Apply Patch Topically Every Week 5)  Pravachol 40 Mg Tabs (Pravastatin Sodium) .Marland Kitchen.. 1 Tab By Mouth Once Daily 6)  Allergy Vaccine  1:10 Go (W-E) .... Once Weekly 7)  Flax Seed Oil 1000 Mg Caps (Flaxseed (Linseed)) .... Take 1 By Mouth Once Daily 8)  Fish Oil 1000 Mg Caps (Omega-3 Fatty Acids) .... Take 1 By Mouth Once Daily 9)  Glucosamine-Chondroitin   Caps (Glucosamine-Chondroit-Vit C-Mn) .... Take 1 Tablet By Mouth Once Daily 10)  Vitamin C 500 Mg  Tabs (Ascorbic Acid) .... Take 1 Tablet By Mouth Once Daily 11)  Calcium-Magnesium-Zinc 333-133-5 Mg Tabs (Calcium-Magnesium-Zinc) .... Take 1 Tablet By Mouth Once Daily 12)  Warfarin Sodium 5 Mg Tabs (Warfarin Sodium) .... Take As Directed 13)  Triamterene-Hctz 37.5-25 Mg Tabs (Triamterene-Hctz) .... Take 1 Tablet Daily 14)  Cyclobenzaprine Hcl 10 Mg Tabs (Cyclobenzaprine Hcl) ....  Take 1 Tablet Every 8 Hours 15)  Omeprazole 20 Mg Cpdr (Omeprazole) .Marland Kitchen.. 1 Tab By Mouth Once Daily 16)  Metoprolol Succinate 100 Mg Xr24h-Tab (Metoprolol Succinate) .... One Daily 17)  Pramipexole Dihydrochloride 0.5 Mg Tabs (Pramipexole Dihydrochloride) .... Take 1/2 To 1 Tablet By Mouth At Bedtime 18)  Clonazepam 0.5 Mg Tabs (Clonazepam) .Marland Kitchen.. 1-2 For Sleep / Dream Activity 19)  Allegra 180 Mg Tabs (Fexofenadine Hcl) .Marland Kitchen.. 1 Tablet By Mouth Daily As Needed For Allergies  Allergies (verified): No Known Drug Allergies  Anticoagulation Management History:      The patient is taking warfarin and comes in today for a routine follow up visit.  Positive risk factors for bleeding include an age of 79 years or older.  The bleeding index is 'intermediate risk'.  Negative CHADS2 values include Age > 65 years old.  The start date was 12/21/2006.  Anticoagulation responsible provider: [name] MD, [name].  INR POC: 2.9.  Cuvette Lot#: [id number].  Exp: 01/2011.    Anticoagulation Management Assessment/Plan:      The patient's current anticoagulation dose is Warfarin sodium 5 mg tabs: Take as directed.  The target INR is 2.0-3.0.  The next INR is due 12/24/2009.  Anticoagulation instructions were given to patient.  Results were reviewed/authorized by Elaina Pattee, PharmD.  She was notified by Elaina Pattee, PharmD.         Prior Anticoagulation Instructions: INR 2.8  Continue same dose of  1/2 tablet every day except 1 tablet on Monday.   Current Anticoagulation Instructions: INR 2.9. Take 0.5 tablet daily except 1 tablet on Mondays. Recheck in 4 weeks.

## 2010-07-07 NOTE — Assessment & Plan Note (Signed)
Summary: rov 6 months///kp   Primary Provider/Referring Provider:  Manus Gunning  CC:  6 month follow up visit-nasal/sinus trouble-clear x 4 weeks.Marland Kitchen  History of Present Illness:  07/03/08- Allergic rhinitis, sleep apnea Got both flu shots. Feels well.  Can't sleep with cpap- dislikes having something on face. Tried nasal pilows. Does better since she retired. She occasionally cries or pokes her husbnad in her sleep.  Vivid dreams.Autotitration was no more comfortable. Dislikes covers over head- generally claustophobic. No family hx Parkinson's.  10/01/08 Allergic rhinits, sleep apnea/ insomnia   Quit cpap Very good spring pollen season so far. She feels she is doing fine with allergy vaccine and zyrtec. She quit work and can now sleep in more.. Husband says she isn't "fighting" much in sleep.  Clonazepam didn't help consolidate sleep and she blames husband for wanting to keep bedrroom too warm.  March 31, 2009- Allergic rhinitis, sleep apnea/ insomnia  Quit cpap Had no respiratory complications during Lumbar disc surgery 5 weeks ago and recovering gradually.  Had flu vax. Taking zyrtec to keep "allergy" watery rhinorhea controled. Denies purulent/  bloody, cough or fever. No GI distress.  September 29, 2009- Alleergic Rhinitis, hx OSA Continues to note spring and Fall seasonal watery rhinorhea with some sneeze and cough. Mucus is clear. Not much wheeze and nothing purulent. Taking Zyrtec. Denies respiratory problems when she went back for repair of  lumbar fluid leak residual from Disk surgery she had in Fall of 2010.  Reviewed hx of sleep apnea. She couldn't tolerate CPAP. On pramipexole for restless legs. Her husband tells her she kicks and punches him, suggestive of REM  Behavior Disorder. Much dreaming. Denies fam hx Parkinson's/   Current Medications (verified): 1)  Xalatan 0.005 % Soln (Latanoprost) .... As Directed 2)  Centrum Silver  Tabs (Multiple Vitamins-Minerals) .... Take 1 Tablet By  Mouth Once A Day 3)  Diltiazem Hcl Cr 240 Mg Xr24h-Cap (Diltiazem Hcl) .... Take 1 Capsule By Mouth Once A Day 4)  Menostar 14 Mcg/24hr Ptwk (Estradiol) .... Holding 5)  Pravachol 40 Mg Tabs (Pravastatin Sodium) .Marland Kitchen.. 1 Tab By Mouth Once Daily 6)  Allergy Vaccine  1:10 Go (W-E) .... Once Weekly 7)  Zyrtec Allergy 10 Mg Tabs (Cetirizine Hcl) .... Take 1 Tablet By Mouth Once A Day As Needed 8)  Flax Seed Oil 1000 Mg Caps (Flaxseed (Linseed)) .... Take 1 By Mouth Once Daily 9)  Fish Oil 1000 Mg Caps (Omega-3 Fatty Acids) .... Take 1 By Mouth Once Daily 10)  Glucosamine-Chondroitin   Caps (Glucosamine-Chondroit-Vit C-Mn) .... Take 1 Tablet By Mouth Once Daily 11)  Vitamin C 500 Mg  Tabs (Ascorbic Acid) .... Take 1 Tablet By Mouth Once Daily 12)  Calcium-Magnesium-Zinc 333-133-5 Mg Tabs (Calcium-Magnesium-Zinc) .... Take 1 Tablet By Mouth Once Daily 13)  Warfarin Sodium 5 Mg Tabs (Warfarin Sodium) .... Take As Directed 14)  Triamterene-Hctz 37.5-25 Mg Tabs (Triamterene-Hctz) .... Take 1 Tablet Daily 15)  Cyclobenzaprine Hcl 10 Mg Tabs (Cyclobenzaprine Hcl) .... Take 1 Tablet Every 8 Hours 16)  Omeprazole 20 Mg Cpdr (Omeprazole) .Marland Kitchen.. 1 Tab By Mouth Once Daily 17)  Metoprolol Succinate 100 Mg Xr24h-Tab (Metoprolol Succinate) .... One Daily 18)  Pramipexole Dihydrochloride 0.5 Mg Tabs (Pramipexole Dihydrochloride) .... As Needed Restless Legs  Allergies (verified): No Known Drug Allergies  Past History:  Past Medical History: Last updated: 04/23/2008 PAF/ coumadin Hypertension DJD Allergic rhinitis       Vaccine Hyperlipidemia Sleep Apnea  NPSG 01/25/08 AHI 6.3  CPAP auto- titrated to 10  Family History: Last updated: 03-07-08 mother-deceased natural causes, arthritis father-deceased age 54-MI sister-HBP, asthma, allergies  Social History: Last updated: 07-Mar-2008 Patient never smoked.  Pt is married, and has 2 children. Pt is currently employeed in clerical office  work. Husband immunosuppressed after heart transplant in 2000  Risk Factors: Smoking Status: never (01/31/2008)  Past Surgical History: Septoplasty Total Abdominal Hysterectomy Lipoma Lumbar disk surgery Repair spinal fluid leak  Review of Systems      See HPI  The patient denies anorexia, fever, weight loss, weight gain, vision loss, decreased hearing, hoarseness, chest pain, syncope, dyspnea on exertion, peripheral edema, prolonged cough, headaches, hemoptysis, and severe indigestion/heartburn.    Vital Signs:  Patient profile:   70 year old female Height:      66 inches Weight:      201.38 pounds BMI:     32.62 O2 Sat:      99 % on Room air Pulse rate:   77 / minute BP sitting:   120 / 82  (left arm) Cuff size:   regular  Vitals Entered By: Reynaldo Minium CMA (September 29, 2009 10:06 AM)  O2 Flow:  Room air  Physical Exam  Additional Exam:  General: A/Ox3; pleasant and cooperative, NAD,  SKIN: no rash, lesions NODES: no lymphadenopathy HEENT: [place]/AT, EOM- WNL, Conjuctivae- clear, PERRLA, TM-WNL, Nose- turbinate edema, Throat- clear and wnl, Mallampti III,   NECK: Supple w/ fair ROM, JVD- none, normal carotid impulses w/o bruits Thyroid-  CHEST: Clear to P&A HEART: RRR, no m/g/r heard ABDOMEN: Soft and nl;  EAV:WUJW, nl pulses, no edema  NEURO: Grossly intact to observation, no tremor or jiggling      Impression & Recommendations:  Problem # 1:  SLEEP APNEA (ICD-780.57)  Not being treated since she failed CPAP trial. I will let this issue desensitize, then see if we can reassess.. Encourage weight control and side sleeping.  Problem # 2:  ? 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 We will let her try clonazepam. Education done.  Problem # 3:  ALLERGIC RHINITIS (ICD-477.9)  We will have her try allegra as she continues allergy vaccine. Her updated medication list for this problem includes:    Zyrtec Allergy 10 Mg Tabs  (Cetirizine hcl) .Marland Kitchen... Take 1 tablet by mouth once a day as needed  Orders: Est. Patient Level III (11914)  Medications Added to Medication List This Visit: 1)  Clonazepam 0.5 Mg Tabs (Clonazepam) .Marland Kitchen.. 1-2 for sleep / dream activity  Patient Instructions: 1)  Please schedule a follow-up appointment in 6 months. 2)  Try otc allegra 180 as an antihistamine alternative to Zyrtec 3)  Try script clonazepam/ Klonopin- sedative and it can help calm the movement and punching during sleep. Prescriptions: CLONAZEPAM 0.5 MG TABS (CLONAZEPAM) 1-2 for sleep / dream activity  #30 x 1   Entered and Authorized by:   Waymon Budge MD   Signed by:   Waymon Budge MD on 09/29/2009   Method used:   Print then Give to Patient   RxID:   [id number]

## 2010-07-07 NOTE — Consult Note (Signed)
Summary: [facility]  [facility]   Imported By: Marylou Mccoy 06/18/2009 18:43:54  _____________________________________________________________________  External Attachment:    Type:   Image     Comment:   External Document

## 2010-07-07 NOTE — Assessment & Plan Note (Signed)
Summary: 6 monthys/apc   Primary Provider/Referring Provider:  Manus Gunning  CC:  6 month follow up visit-allergies and sleep apnea; doing well with both..  History of Present Illness:  March 31, 2009- Allergic rhinitis, sleep apnea/ insomnia  Quit cpap Had no respiratory complications during Lumbar disc surgery 5 weeks ago and recovering gradually.  Had flu vax. Taking zyrtec to keep "allergy" watery rhinorhea controled. Denies purulent/  bloody, cough or fever. No GI distress.  September 29, 2009- Allergic Rhinitis, hx OSA Continues to note spring and Fall seasonal watery rhinorhea with some sneeze and cough. Mucus is clear. Not much wheeze and nothing purulent. Taking Zyrtec. Denies respiratory problems when she went back for repair of  lumbar fluid leak residual from Disk surgery she had in Fall of 2010.  Reviewed hx of sleep apnea. She couldn't tolerate CPAP. On pramipexole for restless legs. Her husband tells her she kicks and punches him, suggestive of REM  Behavior Disorder. Much dreaming. Denies fam hx Parkinson's/  March 30, 2010- Allergic Rhinitis, hx OSA/ failed CPAP, Restles legs, AFib/ warfarin Nurse-CC: 6 month follow up visit-allergies and sleep apnea; doing well with both. She failed CPAP. Has continued pramipexole for restles legs, but stopped clonazepam that didn't seem to help. Husband isn't reporting that she jerks legs as much anymore. Feels rested.  We reviewed her original diangnostic sleep study- AHI only 6.3= minimally abnormal, and she didn't desaturate below 93% so there is no reason to be considering O2. Rhinitis - continues allergy vaccine. She had to start allegra last 2 days.   Preventive Screening-Counseling & Management  Alcohol-Tobacco     Smoking Status: never  Current Medications (verified): 1)  Centrum Silver  Tabs (Multiple Vitamins-Minerals) .... Take 1 Tablet By Mouth Once A Day 2)  Diltiazem Hcl Cr 240 Mg Xr24h-Cap (Diltiazem Hcl) .... Take 1  Capsule By Mouth Once A Day 3)  Pravachol 40 Mg Tabs (Pravastatin Sodium) .Marland Kitchen.. 1 Tab By Mouth Once Daily 4)  Allergy Vaccine  1:10 Go (W-E) .... Once Weekly 5)  Flax Seed Oil 1200 Mg Caps (Flaxseed (Linseed)) .... Take 1 By Mouth Once Daily 6)  Fish Oil 1200 Mg Caps (Omega-3 Fatty Acids) .... Take 1 By Mouth Once Daily 7)  Vitamin C 500 Mg  Tabs (Ascorbic Acid) .... Take 1 Tablet By Mouth Once Daily 8)  Calcium-Magnesium-Zinc 333-133-5 Mg Tabs (Calcium-Magnesium-Zinc) .... Take 1 Tablet By Mouth Once Daily 9)  Warfarin Sodium 5 Mg Tabs (Warfarin Sodium) .... Take As Directed 10)  Triamterene-Hctz 37.5-25 Mg Tabs (Triamterene-Hctz) .... Take 1 Tablet Daily 11)  Omeprazole 20 Mg Cpdr (Omeprazole) .Marland Kitchen.. 1 Tab By Mouth Once Daily 12)  Metoprolol Succinate 100 Mg Xr24h-Tab (Metoprolol Succinate) .... One Daily 13)  Pramipexole Dihydrochloride 0.5 Mg Tabs (Pramipexole Dihydrochloride) .... Take 1/2 To 1 Tablet By Mouth At Bedtime 14)  Allegra 180 Mg Tabs (Fexofenadine Hcl) .Marland Kitchen.. 1 Tablet By Mouth Daily As Needed For Allergies 15)  Lumigan 0.01 % Soln (Bimatoprost) .... As Idrected 16)  Furosemide 20 Mg Tabs (Furosemide) .... Take One Tablet By Mouth Daily As Needed For Swelling  Allergies (verified): No Known Drug Allergies  Past History:  Past Medical History: Last updated: 04/23/2008 PAF/ coumadin Hypertension DJD Allergic rhinitis       Vaccine Hyperlipidemia Sleep Apnea  NPSG 01/25/08 AHI 6.3        CPAP auto- titrated to 10  Family History: Last updated: March 10, 2008 mother-deceased natural causes, arthritis father-deceased age 49-MI sister-HBP, asthma, allergies  Social History: Last updated: 03/04/2008 Patient never smoked.  Pt is married, and has 2 children. Pt is currently employeed in clerical office work. Husband immunosuppressed after heart transplant in 2000  Risk Factors: Smoking Status: never (03/30/2010)  Past Surgical History: Septoplasty Total Abdominal  Hysterectomy Lipoma Lumbar disk surgery Repair spinal fluid leak Cataract surgery   Review of Systems      See HPI       The patient complains of nasal congestion/difficulty breathing through nose and sneezing.  The patient denies shortness of breath with activity, shortness of breath at rest, productive cough, non-productive cough, coughing up blood, chest pain, irregular heartbeats, acid heartburn, indigestion, loss of appetite, weight change, abdominal pain, difficulty swallowing, sore throat, tooth/dental problems, and headaches.    Vital Signs:  Patient profile:   70 year old female Height:      66 inches Weight:      195.38 pounds BMI:     31.65 O2 Sat:      97 % on Room air Pulse rate:   79 / minute BP sitting:   116 / 72  (left arm) Cuff size:   regular  O2 Flow:  Room air CC: 6 month follow up visit-allergies and sleep apnea; doing well with both.   Physical Exam  Additional Exam:  General: A/Ox3; pleasant and cooperative, NAD,  SKIN: no rash, lesions NODES: no lymphadenopathy HEENT: [place]/AT, EOM- WNL, Conjuctivae- clear, PERRLA, TM-WNL, Nose- turbinate edema, Throat- clear and wnl, Mallampti III,   NECK: Supple w/ fair ROM, JVD- none, normal carotid impulses w/o bruits Thyroid-  CHEST: Clear to P&A HEART: RRR, no m/g/r heard ABDOMEN: Soft and nl;  ZOX:WRUE, nl pulses, no edema  NEURO: Grossly intact to observation, no tremor or jiggling      Impression & Recommendations:  Problem # 1:  SLEEP APNEA (ICD-780.57)  This was minimal. I cautioned her not to gain weight. I don't think her quality of life would be improved by CPAP based on her experience and this score.   Problem # 2:  ATRIAL FIBRILLATION (ICD-427.31) Rhythm and rate control seem good.  Her updated medication list for this problem includes:    Warfarin Sodium 5 Mg Tabs (Warfarin sodium) .Marland Kitchen... Take as directed    Metoprolol Succinate 100 Mg Xr24h-tab (Metoprolol succinate) ..... One  daily  Problem # 3:  ? 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  Medications Added to Medication List This Visit: 1)  Flax Seed Oil 1200 Mg Caps (flaxseed (linseed))  .... Take 1 by mouth once daily 2)  Fish Oil 1200 Mg Caps (Omega-3 fatty acids) .... Take 1 by mouth once daily  Other Orders: Est. Patient Level IV (45409)  Patient Instructions: 1)  Please schedule a follow-up appointment in 6 months. 2)  Please call sooner if I can help you.    Immunization History:  Influenza Immunization History:    Influenza:  historical (03/20/2010)

## 2010-07-07 NOTE — Letter (Signed)
Summary: Handout Printed  Printed Handout:  - Coumadin Instructions 

## 2010-07-07 NOTE — Medication Information (Signed)
Summary: rov/sp  Anticoagulant Therapy  Managed by: Bethena Midget, RN, BSN Referring MD: Charlton Haws MD PCP: Ihor Austin MD: Jens Som MD, Arlys [name] Indication 1: Atrial Fibrillation (ICD-427.31) Lab Used: [facility] 15.8 INR POC 1.6 INR RANGE 2 - 3  Dietary changes: yes       Details: Eating less greens/  Health status changes: yes       Details: Higher bp  Bleeding/hemorrhagic complications: no    Recent/future hospitalizations: yes       Details: ER visit on 12-18-2008, for HT  Any changes in medication regimen? yes       Details: Diltiazem was changed from 180mg  to 240mg . Pt was also started on Rybix 50mg  (Tramadol).  Recent/future dental: no  Any missed doses?: no       Is patient compliant with meds? yes      Comments: Pt took her last dose of Rybix on 12-18-2008 Pt had a Cortizone injection on 12-12-2008  Current Medications (verified): 1)  Xalatan 0.005 % Soln (Latanoprost) .... As Directed 2)  Centrum Silver  Tabs (Multiple Vitamins-Minerals) .... Take 1 Tablet By Mouth Once A Day 3)  Diltiazem Hcl Cr 240 Mg Xr24h-Cap (Diltiazem Hcl) .... Take 1 Capsule By Mouth Once A Day 4)  Menostar 14 Mcg/24hr Ptwk (Estradiol) .... Apply Patch Once Weekly 5)  Pravastatin Sodium 20 Mg Tabs (Pravastatin Sodium) .... Take 1 Tab By Mouth At Bedtime 6)  Allergy Vaccine  1:10 Go (W-E) .... Once Weekly 7)  Zyrtec Allergy 10 Mg Tabs (Cetirizine Hcl) .... Take 1 Tablet By Mouth Once A Day As Needed 8)  Flaxseed Oil 1200 Mg Caps (Flaxseed (Linseed)) .... Take 1 Tablet By Mouth Once A Day 9)  Clonazepam 0.5 Mg Tabs (Clonazepam) .Marland Kitchen.. 1 For Sleep If Needed 10)  Coumadin 5 Mg Tabs (Warfarin Sodium) .... Take As Directed By Anticoagulation Clinic 11)  Prilosec 20 Mg Cpdr (Omeprazole) .... Take 1 Capsule By Mouth Once Daily 12)  Fish Oil 1200 Mg Caps (Omega-3 Fatty Acids) .... Take 1 Capsule By Mouth Once Daily 13)  Red Yeast Rice 600 Mg Caps (Red Yeast Rice Extract) .... Take 2 Capsules By Mouth  Once Daily 14)  Glucosamine-Chondroitin   Caps (Glucosamine-Chondroit-Vit C-Mn) .... Take 1 Tablet By Mouth Once Daily 15)  Vitamin C 500 Mg  Tabs (Ascorbic Acid) .... Take 1 Tablet By Mouth Once Daily 16)  Calcium-Magnesium-Zinc 333-133-5 Mg Tabs (Calcium-Magnesium-Zinc) .... Take 1 Tablet By Mouth Once Daily 17)  Tramadol Hcl 50 Mg Tabs (Tramadol Hcl) .... Take 1 Tablet Every 4 Hours As Needed For Pain 18)  Dilt-Cd 180 Mg Xr24h-Cap (Diltiazem Hcl Coated Beads) .... Take 1 Capsule By Mouth Once A Day 19)  Warfarin Sodium 5 Mg Tabs (Warfarin Sodium) .... Take As Directed  Allergies (verified): No Known Drug Allergies  Anticoagulation Management History:      The patient is taking warfarin and comes in today for a routine follow up visit.  Positive risk factors for bleeding include an age of 13 years or older.  The bleeding index is 'intermediate risk'.  Negative CHADS2 values include Age > 9 years old.  The start date was 12/21/2006.  Prothrombin time is 15.8.  Anticoagulation responsible provider: Jens Som MD, Arlys [name].  INR POC: 1.6.  Cuvette Lot#: [id number].  Exp: 12/2009.    Anticoagulation Management Assessment/Plan:      The patient's current anticoagulation dose is Coumadin 5 mg tabs: Take as directed by Anticoagulation Clinic, Warfarin sodium 5 mg tabs: Take  as directed.  The target INR is 2.0-3.0.  The next INR is due 12/30/2008.  Anticoagulation instructions were given to patient.  Results were reviewed/authorized by Bethena Midget, RN, BSN.  She was notified by Cloyde Reams, RN, BSN.         Prior Anticoagulation Instructions: INR 1.1 today Coumadin has been on hold x 5 days for injection in back on 12/12/08. Pt to resume coumadin 2.5mg  once daily except 5mg  on Mondays nad recheck in 7-10 days  Current Anticoagulation Instructions: INR 1.6  Take an extra 1/2 tablet today then resume previous dosage of 1/2 tablet daily except 1 tablet on Mondays.  Recheck x 10 days.

## 2010-07-07 NOTE — Assessment & Plan Note (Signed)
Summary: follow up/ mbw   PCP:  Ehinger  Chief Complaint:  Sleep apnea.  History of Present Illness: Current Problems:  HYPERLIPIDEMIA (ICD-272.4) SLEEP APNEA (ICD-780.57) DEGENERATIVE JOINT DISEASE (ICD-715.90) GLAUCOMA (ICD-365.9) CAD (ICD-414.00) ALLERGIC RHINITIS (ICD-477.9)    01/31/08- 70 year old woman returning for follow-up of allergic rhinitis.  Continues allergy vaccine at 1:10 without problems. Evaluated for paroxysmal atrial fibrillation.   Cardiologist recognized possible sleep apnea. She has been told that her legs jerk in sleep when she is dreaming. NPSG: AHI 6.3, desat nadir 93%, insignificant movement disturbance.  03/04/08- Feels smothered with cpap, too hot. Can't wear cpap and sleep. Turns humidifier off. Stress- husband and work. Will retire in few months. Allergy vaccine ok.          Prior Medications Reviewed Using: List Brought by Patient  Updated Prior Medication List: * ALLERGY VACCINE  1:10 once weekly XALATAN 0.005 % SOLN (LATANOPROST) as directed CENTRUM SILVER  TABS (MULTIPLE VITAMINS-MINERALS) Take 1 tablet by mouth once a day ZEGERID 40-1100 MG CAPS (OMEPRAZOLE-SODIUM BICARBONATE) Take 1 tab by mouth at bedtime COUMADIN 5 MG TABS (WARFARIN SODIUM) as directed DILTIAZEM HCL CR 180 MG XR24H-CAP (DILTIAZEM HCL) Take 1 tablet by mouth once a day MENOSTAR 14 MCG/24HR PTWK (ESTRADIOL) Apply patch once weekly PRAVASTATIN SODIUM 20 MG TABS (PRAVASTATIN SODIUM) Take 1 tab by mouth at bedtime ZYRTEC ALLERGY 10 MG TABS (CETIRIZINE HCL) Take 1 tablet by mouth once a day as needed * CPAP AUTOSTART   Current Allergies (reviewed today): No known allergies   Past Medical History:    Reviewed history from 01/31/2008 and no changes required:       PAF/ coumadin       CAD       DJD       Allergic rhinitis             Vaccine       Hyperlipidemia       Sleep Apnea  NPSG 01/25/08 AHI 6.3              CPAP auto   Family History:  mother-deceased natural causes, arthritis    father-deceased age 73-MI    sister-HBP, asthma, allergies  Social History:    Reviewed history from 01/31/2008 and no changes required:       Patient never smoked.        Pt is married, and has 2 children.       Pt is currently employeed in clerical office work.       Husband immunosuppressed after heart transplant in 2000    Review of Systems       Denies headache, sinus drainage, sneezing chest pain, dyspnea, n/v/d, weight loss, fever, edema.     Vital Signs:  Patient Profile:   70 Years Old Female Weight:      196.25 pounds O2 Sat:      99 % O2 treatment:    Room Air Pulse rate:   66 / minute BP sitting:   132 / 78  (left arm) Cuff size:   regular  Vitals Entered By: Cloyde Reams RN (March 04, 2008 10:30 AM)             Comments Pt is here today for a follow-up visit.  Pt started on CPAP last visit.  Pt feels like she is smothering when has it on.  Pt is on second mask which seems to be working a little better. Medications reviewed Cloyde Reams RN  March 04, 2008  10:32 AM      Physical Exam  General: A/Ox3; pleasant and cooperative, NAD, overweight SKIN: no rash, lesions NODES: no lymphadenopathy HEENT: [place]/AT, EOM- WNL, Conjuctivae- clear, PERRLA, TM-WNL, Nose- turbinate edema, Throat- clear and wnl, melampatti III NECK: Supple w/ fair ROM, JVD- none, normal carotid impulses w/o bruits Thyroid- normal to palpation CHEST: Clear to P&A HEART: RRR, no m/g/r heard ABDOMEN: Soft and nl; nml bowel sounds; no organomegaly or masses noted [id number], nl pulses, no edema  NEURO: Grossly intact to observation         Impression & Recommendations:  Problem # 1:  SLEEP APNEA (ICD-780.57) Need download from [employer]. Consider cool passover humidifier. Weight loss, oral appliance options for very mild osa.   Problem # 2:  ALLERGIC RHINITIS (ICD-477.9) Continue Zyrtec and vaccine. Her updated medication  list for this problem includes:    Zyrtec Allergy 10 Mg Tabs (Cetirizine hcl) .Marland Kitchen... Take 1 tablet by mouth once a day as needed    Patient Instructions: 1)  Please schedule a follow-up appointment in 1 month. 2)  We will contact [employer] about working with you to make cpap more comfortable   ]

## 2010-07-07 NOTE — Assessment & Plan Note (Signed)
Summary: rov per pt call/lg   Primary Provider:  Manus Gunning  CC:  sob. Pt recently had surgery on her back.  History of Present Illness: She has a history of paroxysmal atrial fibrillation on Coumadin. She has history of atypical chest pain with a normal cardiac catheter in 2002, and her last Myoview was in 2009. She denies recurrent chest pain, palpitations, dizziness, or presyncope. She does have chronic dyspnea on exertion. She has had a rough time with two recent back surgeries the 2nd for a leak.  She has been tachycardic since D/C but not in afib.  She had been on Toprol in the past and we will reinstitute it again.  I will check basic lab work to make sure she is not anemic.  Exercise Tolerance Test Cardiovascular Risk History:      Positive major cardiovascular risk factors include female age over 37 years old and hyperlipidemia.  Negative major cardiovascular risk factors include non-tobacco-user status.    Baseline EKG:    Interpretation:     NSR 96 Low voltage Borderline ECG  Exercise Tolerance Test Results:    MPHR (bpm):        154    85% MPHR (bpm):     131  Current Problems (verified): 1)  Pre-operative Cardiovascular Examination  (ICD-V72.81) 2)  Coumadin Therapy  (ICD-V58.61) 3)  Atrial Fibrillation  (ICD-427.31) 4)  Hyperlipidemia  (ICD-272.4) 5)  Sleep Apnea  (ICD-780.57) 6)  Degenerative Joint Disease  (ICD-715.90) 7)  Glaucoma  (ICD-365.9) 8)  Allergic Rhinitis  (ICD-477.9)  Current Medications (verified): 1)  Xalatan 0.005 % Soln (Latanoprost) .... As Directed 2)  Centrum Silver  Tabs (Multiple Vitamins-Minerals) .... Take 1 Tablet By Mouth Once A Day 3)  Diltiazem Hcl Cr 240 Mg Xr24h-Cap (Diltiazem Hcl) .... Take 1 Capsule By Mouth Once A Day 4)  Menostar 14 Mcg/24hr Ptwk (Estradiol) .... Apply Patch Once Weekly 5)  Pravachol 40 Mg Tabs (Pravastatin Sodium) .Marland Kitchen.. 1 Tab By Mouth Once Daily 6)  Allergy Vaccine  1:10 Go (W-E) .... Once Weekly 7)  Zyrtec Allergy  10 Mg Tabs (Cetirizine Hcl) .... Take 1 Tablet By Mouth Once A Day As Needed 8)  Flax Seed Oil 1000 Mg Caps (Flaxseed (Linseed)) .... Take 1 By Mouth Once Daily 9)  Fish Oil 1000 Mg Caps (Omega-3 Fatty Acids) .... Take 1 By Mouth Once Daily 10)  Glucosamine-Chondroitin   Caps (Glucosamine-Chondroit-Vit C-Mn) .... Take 1 Tablet By Mouth Once Daily 11)  Vitamin C 500 Mg  Tabs (Ascorbic Acid) .... Take 1 Tablet By Mouth Once Daily 12)  Calcium-Magnesium-Zinc 333-133-5 Mg Tabs (Calcium-Magnesium-Zinc) .... Take 1 Tablet By Mouth Once Daily 13)  Warfarin Sodium 5 Mg Tabs (Warfarin Sodium) .... Take As Directed 14)  Vicodin Es 7.5-750 Mg Tabs (Hydrocodone-Acetaminophen) .Marland Kitchen.. 1 Tablet Every 6 Hours 15)  Triamterene-Hctz 37.5-25 Mg Tabs (Triamterene-Hctz) .... Take 1 Tablet Daily 16)  Cyclobenzaprine Hcl 10 Mg Tabs (Cyclobenzaprine Hcl) .... Take 1 Tablet Every 8 Hours 17)  Omeprazole 20 Mg Cpdr (Omeprazole) .Marland Kitchen.. 1 Tab By Mouth Once Daily 18)  Metoprolol Succinate 100 Mg Xr24h-Tab (Metoprolol Succinate) .... One Daily  Allergies (verified): No Known Drug Allergies  Past History:  Past Medical History: Last updated: 04/23/2008 PAF/ coumadin Hypertension DJD Allergic rhinitis       Vaccine Hyperlipidemia Sleep Apnea  NPSG 01/25/08 AHI 6.3        CPAP auto- titrated to 10  Past Surgical History: Last updated: 01/31/2008 Septoplasty Total Abdominal Hysterectomy Lipoma  Family History: Last updated: 03/30/08 mother-deceased natural causes, arthritis father-deceased age 55-MI sister-HBP, asthma, allergies  Social History: Last updated: 03/30/2008 Patient never smoked.  Pt is married, and has 2 children. 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   Vital Signs:  Patient profile:   70 year old female Height:      66 inches Weight:      192 pounds BMI:     31.10 Pulse rate:   108 / minute Resp:     12 per minute BP sitting:   140 / 80  (left arm)  Vitals Entered By: Kem Parkinson (June 25, 2009 10:11 AM)  Physical Exam  General:  Affect appropriate Healthy:  appears stated age HEENT: normal Neck supple with no adenopathy JVP normal no bruits no thyromegaly Lungs clear with no wheezing and good diaphragmatic motion Heart:  S1/S2 no murmur,rub, gallop or click PMI normal Abdomen: benighn, BS positve, no tenderness, no AAA no bruit.  No HSM or HJR Distal pulses intact with no bruits No edema Neuro non-focal Skin warm and dry    Impression & Recommendations:  Problem # 1:  COUMADIN THERAPY (ICD-V58.61) For PAF INR Rx.  No complications off Rx for back surgery  Problem # 2:  ATRIAL FIBRILLATION (ICD-427.31) Maint NSR but tachy.  Check labs restart Torprol  recheck in 8 weeks Her updated medication list for this problem includes:    Warfarin Sodium 5 Mg Tabs (Warfarin sodium) .Marland Kitchen... Take as directed    Metoprolol Succinate 100 Mg Xr24h-tab (Metoprolol succinate) ..... One daily  Orders: EKG w/ Interpretation (93000) TLB-BMP (Basic Metabolic Panel-BMET) (80048-METABOL) TLB-CBC Platelet - w/Differential (85025-CBCD) TLB-TSH (Thyroid Stimulating Hormone) (84443-TSH) TLB-T4 (Thyrox), Free [phone])  Problem # 3:  HYPERLIPIDEMIA (ICD-272.4) Check labs in 6 months.   Her updated medication list for this problem includes:    Pravachol 40 Mg Tabs (Pravastatin sodium) .Marland Kitchen... 1 tab by mouth once daily  Cardiovascular Risk Assessment/Plan:      The patient's hypertensive risk group is category B: At least one risk factor (excluding diabetes) with no target organ damage.  Today's blood pressure is 140/80.    Patient Instructions: 1)  Your  physician recommends that you schedule a follow-up appointment in: 4 to 6 weeks with Dr [name] 2)  Your physician recommends that you have labs today. 3)  Your physician has recommended you make the following change in your medication: Start Metoprolol succinate 100mg  daily 4)  You have been diagnosed with atrial fibrillation.  Atrial fibrillation is a condition in which one of the upper chambers of the heart has extra electrical cells causing it to beat very fast.  Please see the handout/brochure given to you today for further information. Prescriptions: METOPROLOL SUCCINATE 100 MG XR24H-TAB (METOPROLOL SUCCINATE) one daily  #30 x 11   Entered by:   Charolotte Capuchin, RN   Authorized by:   Colon Branch, MD, [facility]   Signed by:   Charolotte Capuchin, RN on 06/25/2009   Method used:   Electronically to        [facility]* (retail)       [address]  [place], [place]  [address]       Ph: [phone] or [phone]       Fax: [phone]   RxID:   [id number]    Cardiovascular Risk Assessment/Plan:      The patient's hypertensive risk group is category B: At least one risk factor (excluding diabetes) with no target organ damage.  Today's blood pressure is 140/80.      EKG Report  Procedure date:  06/25/2009  Findings:      NSR 96 Low voltage Borderline ECG   Appended Document: rov per pt call/lg Talk to primary doctor about adding KCL to med list.  On thiazide diuretic with low K

## 2010-07-07 NOTE — Medication Information (Signed)
Summary: rov/ewj  Anticoagulant Therapy  Managed by: Bethena Midget, RN, BSN Referring MD: Charlton Haws MD PCP: Ihor Austin MD: Riley Kill MD, Maisie Fus Indication 1: Atrial Fibrillation (ICD-427.31) Lab Used: [facility] Site: [facility] PT 16.8 INR POC 1.9 INR RANGE 2 - 3  Dietary changes: no    Health status changes: no    Bleeding/hemorrhagic complications: no    Recent/future hospitalizations: no    Any changes in medication regimen? no    Recent/future dental: no  Any missed doses?: no       Is patient compliant with meds? yes       Current Medications (verified): 1)  Xalatan 0.005 % Soln (Latanoprost) .... As Directed 2)  Centrum Silver  Tabs (Multiple Vitamins-Minerals) .... Take 1 Tablet By Mouth Once A Day 3)  Diltiazem Hcl Cr 240 Mg Xr24h-Cap (Diltiazem Hcl) .... Take 1 Capsule By Mouth Once A Day 4)  Menostar 14 Mcg/24hr Ptwk (Estradiol) .... Apply Patch Once Weekly 5)  Pravastatin Sodium 20 Mg Tabs (Pravastatin Sodium) .... Take 1 Tab By Mouth At Bedtime 6)  Allergy Vaccine  1:10 Go (W-E) .... Once Weekly 7)  Zyrtec Allergy 10 Mg Tabs (Cetirizine Hcl) .... Take 1 Tablet By Mouth Once A Day As Needed 8)  Flaxseed Oil 1200 Mg Caps (Flaxseed (Linseed)) .... Take 1 Tablet By Mouth Once A Day 9)  Clonazepam 0.5 Mg Tabs (Clonazepam) .Marland Kitchen.. 1 For Sleep If Needed 10)  Coumadin 5 Mg Tabs (Warfarin Sodium) .... Take As Directed By Anticoagulation Clinic 11)  Prilosec 20 Mg Cpdr (Omeprazole) .... Take 1 Capsule By Mouth Once Daily 12)  Fish Oil 1200 Mg Caps (Omega-3 Fatty Acids) .... Take 1 Capsule By Mouth Once Daily 13)  Red Yeast Rice 600 Mg Caps (Red Yeast Rice Extract) .... Take 2 Capsules By Mouth Once Daily 14)  Glucosamine-Chondroitin   Caps (Glucosamine-Chondroit-Vit C-Mn) .... Take 1 Tablet By Mouth Once Daily 15)  Vitamin C 500 Mg  Tabs (Ascorbic Acid) .... Take 1 Tablet By Mouth Once Daily 16)  Calcium-Magnesium-Zinc 333-133-5 Mg Tabs  (Calcium-Magnesium-Zinc) .... Take 1 Tablet By Mouth Once Daily 17)  Tramadol Hcl 50 Mg Tabs (Tramadol Hcl) .... Take 1 Tablet Every 4 Hours As Needed For Pain 18)  Dilt-Cd 180 Mg Xr24h-Cap (Diltiazem Hcl Coated Beads) .... Take 1 Capsule By Mouth Once A Day 19)  Warfarin Sodium 5 Mg Tabs (Warfarin Sodium) .... Take As Directed  Allergies (verified): No Known Drug Allergies  Anticoagulation Management History:      The patient is taking warfarin and comes in today for a routine follow up visit.  Positive risk factors for bleeding include an age of 68 years or older.  The bleeding index is 'intermediate risk'.  Negative CHADS2 values include Age > 68 years old.  The start date was 12/21/2006.  Prothrombin time is 16.8.  Anticoagulation responsible provider: Riley Kill MD, Maisie Fus.  INR POC: 1.9.  Cuvette Lot#: [id number].  Exp: 12/2009.    Anticoagulation Management Assessment/Plan:      The patient's current anticoagulation dose is Coumadin 5 mg tabs: Take as directed by Anticoagulation Clinic, Warfarin sodium 5 mg tabs: Take as directed.  The target INR is 2.0-3.0.  The next INR is due 01/14/2009.  Anticoagulation instructions were given to patient.  Results were reviewed/authorized by Bethena Midget, RN, BSN.  She was notified by Cloyde Reams RN.         Prior Anticoagulation Instructions: INR 1.6  Take an extra 1/2 tablet today  then resume previous dosage of 1/2 tablet daily except 1 tablet on Mondays.  Recheck x 10 days.    Current Anticoagulation Instructions: INR 1.9  Start on 1 tablet Mondays and Fridays.  1/2 tablet all other days.  Recheck in 2 weeks.

## 2010-07-07 NOTE — Progress Notes (Signed)
Summary: OFF COUMADIN/ WOULD LIKE AN APPT TOMORROW  Phone Note Call from Patient Call back at [facility] Phone [phone]   Caller: Patient Reason for Call: Talk to Nurse Summary of Call: PT IS OFF HER COUMADIN DUE TO AN INJECTION IN HER BACK. WOULD LIKE AN APPT TOMORROW .  Initial call taken by: Lorne Skeens,  December 10, 2008 8:50 AM  Follow-up for Phone Call        see at 230 pm. she understands she may have wait. Follow-up by: Shelby Dubin PharmD, BCPS, CPP,  December 10, 2008 7:01 PM

## 2010-07-07 NOTE — Assessment & Plan Note (Signed)
Summary: 6wk f/u sl   Primary Provider:  Manus Gunning   History of Present Illness: She has a history of paroxysmal atrial fibrillation on Coumadin. She has history of atypical chest pain with a normal cardiac catheter in 2002, and her last Myoview was in 2009. She denies recurrent chest pain, palpitations, dizziness, or presyncope. She does have chronic dyspnea on exertion. She has had a rough time with two recent back surgeries the 2nd for a leak.  She has been tachycardic since D/C but not in afib.  She had been on Toprol in the past and we restarted it her last visit.  It seems to have helped.  Current Problems (verified): 1)  Pre-operative Cardiovascular Examination  (ICD-V72.81) 2)  Coumadin Therapy  (ICD-V58.61) 3)  Atrial Fibrillation  (ICD-427.31) 4)  Hyperlipidemia  (ICD-272.4) 5)  Sleep Apnea  (ICD-780.57) 6)  Degenerative Joint Disease  (ICD-715.90) 7)  Glaucoma  (ICD-365.9) 8)  Allergic Rhinitis  (ICD-477.9)  Current Medications (verified): 1)  Xalatan 0.005 % Soln (Latanoprost) .... As Directed 2)  Centrum Silver  Tabs (Multiple Vitamins-Minerals) .... Take 1 Tablet By Mouth Once A Day 3)  Diltiazem Hcl Cr 240 Mg Xr24h-Cap (Diltiazem Hcl) .... Take 1 Capsule By Mouth Once A Day 4)  Menostar 14 Mcg/24hr Ptwk (Estradiol) .... Holding 5)  Pravachol 40 Mg Tabs (Pravastatin Sodium) .Marland Kitchen.. 1 Tab By Mouth Once Daily 6)  Allergy Vaccine  1:10 Go (W-E) .... Once Weekly 7)  Zyrtec Allergy 10 Mg Tabs (Cetirizine Hcl) .... Take 1 Tablet By Mouth Once A Day As Needed 8)  Flax Seed Oil 1000 Mg Caps (Flaxseed (Linseed)) .... Take 1 By Mouth Once Daily 9)  Fish Oil 1000 Mg Caps (Omega-3 Fatty Acids) .... Take 1 By Mouth Once Daily 10)  Glucosamine-Chondroitin   Caps (Glucosamine-Chondroit-Vit C-Mn) .... Take 1 Tablet By Mouth Once Daily 11)  Vitamin C 500 Mg  Tabs (Ascorbic Acid) .... Take 1 Tablet By Mouth Once Daily 12)  Calcium-Magnesium-Zinc 333-133-5 Mg Tabs (Calcium-Magnesium-Zinc) ....  Take 1 Tablet By Mouth Once Daily 13)  Warfarin Sodium 5 Mg Tabs (Warfarin Sodium) .... Take As Directed 14)  Vicodin Es 7.5-750 Mg Tabs (Hydrocodone-Acetaminophen) .Marland Kitchen.. 1 Tablet Every 6 Hours 15)  Triamterene-Hctz 37.5-25 Mg Tabs (Triamterene-Hctz) .... Take 1 Tablet Daily 16)  Cyclobenzaprine Hcl 10 Mg Tabs (Cyclobenzaprine Hcl) .... Take 1 Tablet Every 8 Hours 17)  Omeprazole 20 Mg Cpdr (Omeprazole) .Marland Kitchen.. 1 Tab By Mouth Once Daily 18)  Metoprolol Succinate 100 Mg Xr24h-Tab (Metoprolol Succinate) .... One Daily 19)  Pramipexole Dihydrochloride 0.5 Mg Tabs (Pramipexole Dihydrochloride) .... As Needed Restless Legs  Allergies (verified): No Known Drug Allergies  Past History:  Past Medical History: Last updated: 04/23/2008 PAF/ coumadin Hypertension DJD Allergic rhinitis       Vaccine Hyperlipidemia Sleep Apnea  NPSG 01/25/08 AHI 6.3        CPAP auto- titrated to 10  Past Surgical History: Last updated: 01/31/2008 Septoplasty Total Abdominal Hysterectomy Lipoma  Family History: Last updated: 03-15-08 mother-deceased natural causes, arthritis father-deceased age 65-MI sister-HBP, asthma, allergies  Social History: Last updated: 2008-03-15 Patient never smoked.  Pt is married, and has 2 children. Pt is currently employeed in clerical office work. Husband immunosuppressed after heart transplant in 2000  Review of Systems       Denies fever, malais, weight loss, blurry vision, decreased visual acuity, cough, sputum, SOB, hemoptysis, pleuritic pain, palpitaitons, heartburn, abdominal pain, melena, lower extremity edema, claudication, or rash.   Vital Signs:  Patient profile:   70 year old female Height:      66 inches Weight:      196 pounds BMI:     31.75 Pulse rate:   86 / minute Resp:     16 per minute BP sitting:   134 / 76  (left arm)  Vitals Entered By: Marrion Coy, CNA (August 07, 2009 10:13 AM)  Physical Exam  General:  Affect appropriate Healthy:   appears stated age HEENT: normal Neck supple with no adenopathy JVP normal no bruits no thyromegaly Lungs clear with no wheezing and good diaphragmatic motion Heart:  S1/S2 no murmur,rub, gallop or click PMI normal Abdomen: benighn, BS positve, no tenderness, no AAA no bruit.  No HSM or HJR Distal pulses intact with no bruits No edema Neuro non-focal Skin warm and dry    Impression & Recommendations:  Problem # 1:  ATRIAL FIBRILLATION (ICD-427.31) In NSR.  Continue anticoagulation and BB Her updated medication list for this problem includes:    Warfarin Sodium 5 Mg Tabs (Warfarin sodium) .Marland Kitchen... Take as directed    Metoprolol Succinate 100 Mg Xr24h-tab (Metoprolol succinate) ..... One daily  Problem # 2:  HYPERLIPIDEMIA (ICD-272.4) Lipid and liver in 6 months.  Target LDL less than 130 Her updated medication list for this problem includes:    Pravachol 40 Mg Tabs (Pravastatin sodium) .Marland Kitchen... 1 tab by mouth once daily  Problem # 3:  COUMADIN THERAPY (ICD-V58.61) Assessment: Unchanged No bleeding diathesis  F/U clinic.    Patient Instructions: 1)  Your physician recommends that you schedule a follow-up appointment in: 6 MONTHS WITH DR [name] 2)  Your physician recommends that you continue on your current medications as directed. Please refer to the Current Medication list given to you today. Prescriptions: METOPROLOL SUCCINATE 100 MG XR24H-TAB (METOPROLOL SUCCINATE) one daily  #90 x 3   Entered by:   Scherrie Bateman, LPN   Authorized by:   Colon Branch, MD, [facility]   Signed by:   Scherrie Bateman, LPN on 81/19/1478   Method used:   Faxed to ...       [facility] (mail-order)             , [place]         Ph:        Fax: [phone]   RxID:   [id number]

## 2010-07-07 NOTE — Assessment & Plan Note (Signed)
Summary: 3 months/apc   Primary Provider/Referring Provider:  Manus Gunning  CC:  3 month follow up visit-sleep apnea..  History of Present Illness: Current Problems:  COUMADIN THERAPY (ICD-V58.61) ATRIAL FIBRILLATION (ICD-427.31) HYPERLIPIDEMIA (ICD-272.4) SLEEP APNEA (ICD-780.57) DEGENERATIVE JOINT DISEASE (ICD-715.90) GLAUCOMA (ICD-365.9) ALLERGIC RHINITIS (ICD-477.9)    01/31/08- 70 year old woman returning for follow-up of allergic rhinitis.  Continues allergy vaccine at 1:10 without problems. Evaluated for paroxysmal atrial fibrillation.   Cardiologist recognized possible sleep apnea. She has been told that her legs jerk in sleep when she is dreaming. NPSG: AHI 6.3, desat nadir 93%, insignificant movement disturbance.  03/04/08- Feels smothered with cpap, too hot. Can't wear cpap and sleep. Turns humidifier off. Stress- husband and work. Will retire in few months. Allergy vaccine ok.  04/04/08- Allergic rhinitis, sleep apnea Rhinitis- continues allergy vaccine with good control. Zyrtec as needed. OSA- Advanced adjusting masks. Autotitrated to cpap 10. Discussed switch to fixed pressure. Has been adjusting well, compliance good.  07/03/08- Allergic rhinitis, sleep apnea Got both flu shots. Feels well.  Can't sleep with cpap- dislikes having something on face. Tried nasal pilows. Does better since she retired. She occasionally cries or pokes her husbnad in her sleep.  Vivid dreams.Autotitration was no more comfortable. Dislikes covers over head- generally claustophobic. No family hx Parkinson's.  10/01/08 Allergic rhinits, sleep apnea/ insomnia   Quit cpap Very good spring pollen season so far. She feels she is doing fine with allergy vaccine and zyrtec. She quit work and can now sleep in more.. Husband says she isn't "fighting" much in sleep.  Clonazepam didn't help consolidate sleep and she blames husband for wanting to keep bedrroom too warm.       Current Medications  (verified): 1)  Xalatan 0.005 % Soln (Latanoprost) .... As Directed 2)  Centrum Silver  Tabs (Multiple Vitamins-Minerals) .... Take 1 Tablet By Mouth Once A Day 3)  Zegerid 40-1100 Mg Caps (Omeprazole-Sodium Bicarbonate) .... Take 1 Tab By Mouth At Bedtime 4)  Coumadin 5 Mg Tabs (Warfarin Sodium) .... As Directed 5)  Diltiazem Hcl Cr 180 Mg Xr24h-Cap (Diltiazem Hcl) .... Take 1 Tablet By Mouth Once A Day 6)  Menostar 14 Mcg/24hr Ptwk (Estradiol) .... Apply Patch Once Weekly 7)  Pravastatin Sodium 20 Mg Tabs (Pravastatin Sodium) .... Take 1 Tab By Mouth At Bedtime 8)  Allergy Vaccine  1:10 Go (W-E) .... Once Weekly 9)  Zyrtec Allergy 10 Mg Tabs (Cetirizine Hcl) .... Take 1 Tablet By Mouth Once A Day As Needed 10)  Flaxseed Oil 1200 Mg Caps (Flaxseed (Linseed)) .... Take 1 Tablet By Mouth Once A Day 11)  Clonazepam 0.5 Mg Tabs (Clonazepam) .Marland Kitchen.. 1 For Sleep If Needed  Allergies (verified): No Known Drug Allergies  Past History:  Family History:    mother-deceased natural causes, arthritis    father-deceased age 106-MI    sister-HBP, asthma, allergies     (03/04/2008)  Social History:    Patient never smoked.     Pt is married, and has 2 children.    Pt is currently employeed in clerical office work.    Husband immunosuppressed after heart transplant in 2000 (03/04/2008)  Risk Factors:    Alcohol Use: N/A    >5 drinks/d w/in last 3 months: N/A    Caffeine Use: N/A    Diet: N/A    Exercise: N/A  Risk Factors:    Smoking Status: never (01/31/2008)    Packs/Day: N/A    Cigars/wk: N/A    Pipe Use/wk: N/A  Cans of tobacco/wk: N/A    Passive Smoke Exposure: N/A  Past medical, surgical, family and social histories (including risk factors) reviewed for relevance to current acute and chronic problems.  Past Medical History:    Reviewed history from 04/23/2008 and no changes required:    PAF/ coumadin    Hypertension    DJD    Allergic rhinitis          Vaccine     Hyperlipidemia    Sleep Apnea  NPSG 01/25/08 AHI 6.3           CPAP auto- titrated to 10  Past Surgical History:    Reviewed history from 01/31/2008 and no changes required:    Septoplasty    Total Abdominal Hysterectomy    Lipoma  Family History:    Reviewed history from 03/04/2008 and no changes required:       mother-deceased natural causes, arthritis       father-deceased age 90-MI       sister-HBP, asthma, allergies  Social History:    Reviewed history from 03/04/2008 and no changes required:       Patient never smoked.        Pt is married, and has 2 children.       Pt is currently employeed in clerical office work.       Husband immunosuppressed after heart transplant in 2000  Review of Systems  The patient denies fever, weight loss, chest pain, syncope, prolonged cough, headaches, hemoptysis, enlarged lymph nodes, and angioedema.    Vital Signs:  Patient profile:   70 year old female Height:      66.5 inches Weight:      196.13 pounds BMI:     31.29 Pulse rate:   71 / minute BP sitting:   142 / 80  (left arm) Cuff size:   regular  Vitals Entered By: Reynaldo Minium CMA (October 01, 2008 11:03 AM)  O2 Sat on room air at rest %:  100 CC: 3 month follow up visit-sleep apnea. Comments Medications reviewed with patient Reynaldo Minium CMA  October 01, 2008 11:03 AM    Physical Exam  Additional Exam:  General: A/Ox3; pleasant and cooperative, NAD, overweight SKIN: no rash, lesions NODES: no lymphadenopathy HEENT: [place]/AT, EOM- WNL, Conjuctivae- clear, PERRLA, TM-WNL, Nose- turbinate edema, Throat- clear and wnl, melampatti III,   NECK: Supple w/ fair ROM, JVD- none, normal carotid impulses w/o bruits Thyroid-  CHEST: Clear to P&A HEART: RRR, no m/g/r heard ABDOMEN: Soft and nl; nml bowel sounds; no organomegaly or masses noted ZOX:WRUE, nl pulses, no edema  NEURO: Grossly intact to observation      Impression & Recommendations:  Problem # 1:  SLEEP APNEA  (ICD-780.57)  Not using cpap. discussed sleep hygiene and comfortable sleep environment including issues of temperature.  wil let her try some Lunesta  Problem # 2:  ALLERGIC RHINITIS (ICD-477.9)  Excellent control. Will continue present care. Her updated medication list for this problem includes:    Zyrtec Allergy 10 Mg Tabs (Cetirizine hcl) .Marland Kitchen... Take 1 tablet by mouth once a day as needed  Other Orders: Est. Patient Level II (45409)  Patient Instructions: 1)  Please schedule a follow-up appointment in 6 months. 2)  Try samples Lunest 3 mg, one for sleep if needed. Call for script if helpful

## 2010-07-07 NOTE — Medication Information (Signed)
Summary: rov/tm  Anticoagulant Therapy  Managed by: Bethena Midget, RN, BSN Referring MD: Charlton Haws MD PCP: Ihor Austin MD: Jens Som MD, Arlys [name] Indication 1: Atrial Fibrillation (ICD-427.31) Lab Used: [facility] Site: [facility] INR POC 2.6 INR RANGE 2 - 3    Bleeding/hemorrhagic complications: no    Recent/future hospitalizations: no    Any changes in medication regimen? no    Recent/future dental: no  Any missed doses?: no       Is patient compliant with meds? yes      Comments: Pt was off coumadin for back surgery.  Resumed 03/03/09.  Current Medications (verified): 1)  Xalatan 0.005 % Soln (Latanoprost) .... As Directed 2)  Centrum Silver  Tabs (Multiple Vitamins-Minerals) .... Take 1 Tablet By Mouth Once A Day 3)  Diltiazem Hcl Cr 240 Mg Xr24h-Cap (Diltiazem Hcl) .... Take 1 Capsule By Mouth Once A Day 4)  Menostar 14 Mcg/24hr Ptwk (Estradiol) .... Apply Patch Once Weekly 5)  Pravastatin Sodium 20 Mg Tabs (Pravastatin Sodium) .... Take 1 Tab By Mouth At Bedtime 6)  Allergy Vaccine  1:10 Go (W-E) .... Once Weekly 7)  Zyrtec Allergy 10 Mg Tabs (Cetirizine Hcl) .... Take 1 Tablet By Mouth Once A Day As Needed 8)  Flaxseed Oil 1200 Mg Caps (Flaxseed (Linseed)) .... Take 1 Tablet By Mouth Once A Day 9)  Clonazepam 0.5 Mg Tabs (Clonazepam) .Marland Kitchen.. 1 For Sleep If Needed 10)  Prilosec 20 Mg Cpdr (Omeprazole) .... Take 1 Capsule By Mouth Once Daily 11)  Fish Oil 1200 Mg Caps (Omega-3 Fatty Acids) .... Take 1 Capsule By Mouth Once Daily 12)  Glucosamine-Chondroitin   Caps (Glucosamine-Chondroit-Vit C-Mn) .... Take 1 Tablet By Mouth Once Daily 13)  Vitamin C 500 Mg  Tabs (Ascorbic Acid) .... Take 1 Tablet By Mouth Once Daily 14)  Calcium-Magnesium-Zinc 333-133-5 Mg Tabs (Calcium-Magnesium-Zinc) .... Take 1 Tablet By Mouth Once Daily 15)  Dilt-Cd 180 Mg Xr24h-Cap (Diltiazem Hcl Coated Beads) .... Take 1 Capsule By Mouth Once A Day 16)  Warfarin Sodium 5 Mg Tabs (Warfarin  Sodium) .... Take As Directed 17)  Vicodin Es 7.5-750 Mg Tabs (Hydrocodone-Acetaminophen) .Marland Kitchen.. 1 Tablet Every 6 Hours 18)  Cyclobenzaprine Hcl 10 Mg Tabs (Cyclobenzaprine Hcl) .... Take 1 Tablet Two Times A Day Prn 19)  Colace 100 Mg Caps (Docusate Sodium) .... As Needed  Allergies (verified): No Known Drug Allergies  Anticoagulation Management History:      The patient is taking warfarin and comes in today for a routine follow up visit.  Positive risk factors for bleeding include an age of 34 years or older.  The bleeding index is 'intermediate risk'.  Negative CHADS2 values include Age > 64 years old.  The start date was 12/21/2006.  Anticoagulation responsible provider: Jens Som MD, Arlys [name].  INR POC: 2.6.  Cuvette Lot#: [id number].  Exp: 04/2010.    Anticoagulation Management Assessment/Plan:      The patient's current anticoagulation dose is Warfarin sodium 5 mg tabs: Take as directed.  The target INR is 2.0-3.0.  The next INR is due 03/24/2009.  Anticoagulation instructions were given to patient.  Results were reviewed/authorized by Bethena Midget, RN, BSN.  She was notified by Cloyde Reams RN.         Prior Anticoagulation Instructions: INR 1.1  2.5mg s everyday except 5mg s on Mondays and Fridays. Recheck in one week.   Current Anticoagulation Instructions: INR 2.6  Continue on same dosage 1 tablet on Mondays and Fridays and 1/2 tablet all other  days.  Recheck in 2 weeks.

## 2010-07-07 NOTE — Medication Information (Signed)
Summary: rov/sl  Anticoagulant Therapy  Managed by: [name] Reams, [name], [name] Referring MD: [name] Haws MD PCP: [name] Austin MD: [name] Latch MD, [name] Indication 1: Atrial Fibrillation (ICD-427.31) Lab Used: [facility] Site: [facility] INR POC 2.4 INR RANGE 2 - 3  Dietary changes: no    Health status changes: no    Bleeding/hemorrhagic complications: no    Recent/future hospitalizations: no    Any changes in medication regimen? yes       Details: Off Menastar patch, out of Glucosamine at present.    Recent/future dental: no  Any missed doses?: no       Is patient compliant with meds? yes       Allergies: No Known Drug Allergies  Anticoagulation Management History:      The patient is taking warfarin and comes in today for a routine follow up visit.  Positive risk factors for bleeding include an age of 68 years or older.  The bleeding index is 'intermediate risk'.  Negative CHADS2 values include Age > 68 years old.  The start date was 12/21/2006.  Anticoagulation responsible provider: Shirlee Latch MD, [name].  INR POC: 2.4.  Cuvette Lot#: [id number].  Exp: 03/2011.    Anticoagulation Management Assessment/Plan:      The patient's current anticoagulation dose is Warfarin sodium 5 mg tabs: Take as directed.  The target INR is 2.0-3.0.  The next INR is due 03/19/2010.  Anticoagulation instructions were given to patient.  Results were reviewed/authorized by [name] Reams, [name], [name].  She was notified by [name] Reams [name].         Prior Anticoagulation Instructions: INR 2.3  Continue taking Coumadin 0.5 tab (2.5 mg) on all days except Coumadin 1 tab (5 mg) on Mondays. Return to clinic in 4 weeks.   Current Anticoagulation Instructions: INR 2.4  Continue on same dosage 1/2 tablet daily except 1 tablet on Mondays.  Recheck in 4 weeks.

## 2010-07-07 NOTE — Medication Information (Signed)
Summary: rov.mp  Anticoagulant Therapy  Managed by: Vashti Hey, RN Referring MD: Charlton Haws MD PCP: Ihor Austin MD: Johney Frame MD, Fayrene Fearing Indication 1: Atrial Fibrillation (ICD-427.31) Lab Used: [facility] 12.9 INR POC 1.1 INR RANGE 2 - 3  Dietary changes: no    Health status changes: no    Bleeding/hemorrhagic complications: no    Recent/future hospitalizations: yes       Details: scheduled for injection in back 12/12/08  Any changes in medication regimen? no    Recent/future dental: no  Any missed doses?: yes     Details: been off coumadin x 5 days   Comments: schedueld for injection in back on 12/12/08 by Dr Alvester Morin.  Been off coumadin x 5 days.  Allergies (verified): No Known Drug Allergies  Anticoagulation Management History:      The patient is taking warfarin and comes in today for a routine follow up visit.  Positive risk factors for bleeding include an age of 42 years or older.  The bleeding index is 'intermediate risk'.  Negative CHADS2 values include Age > 55 years old.  The start date was 12/21/2006.  Prothrombin time is 12.9.  Anticoagulation responsible provider: Muhammad Vacca MD, Fayrene Fearing.  INR POC: 1.1.  Cuvette Lot#: [id number].  Exp: 12/2009.    Anticoagulation Management Assessment/Plan:      The patient's current anticoagulation dose is Coumadin 5 mg tabs: Take as directed by Anticoagulation Clinic, Warfarin sodium 5 mg tabs: Take as directed.  The target INR is 2.0-3.0.  The next INR is due 12/20/2008.  Anticoagulation instructions were given to patient.  Results were reviewed/authorized by Vashti Hey, RN.  She was notified by Vashti Hey RN.         Prior Anticoagulation Instructions: INR 2.3  The patient is to continue with the same dose of coumadin.  This dosage includes: 2.5mg  (1/2 tablet) everyday except 5mg  (1 tablet) on Mondays  Current Anticoagulation Instructions: INR 1.1 today Coumadin has been on hold x 5 days for injection in back on 12/12/08. Pt to resume  coumadin 2.5mg  once daily except 5mg  on Mondays nad recheck in 7-10 days

## 2010-07-07 NOTE — Medication Information (Signed)
Summary: rov/eh  Anticoagulant Therapy  Managed by: Cloyde Reams, RN Referring MD: Charlton Haws MD PCP: Ihor Austin MD: Excell Seltzer MD, Casimiro Needle Indication 1: Atrial Fibrillation (ICD-427.31) Lab Used: [facility] Site: [facility] INR POC 3.0 INR RANGE 2 - 3  Dietary changes: no    Health status changes: no    Bleeding/hemorrhagic complications: no    Recent/future hospitalizations: no    Any changes in medication regimen? yes       Details: Started on Metoprolol 100mg  qd.  Recent/future dental: no  Any missed doses?: no       Is patient compliant with meds? yes       Current Medications (verified): 1)  Xalatan 0.005 % Soln (Latanoprost) .... As Directed 2)  Centrum Silver  Tabs (Multiple Vitamins-Minerals) .... Take 1 Tablet By Mouth Once A Day 3)  Diltiazem Hcl Cr 240 Mg Xr24h-Cap (Diltiazem Hcl) .... Take 1 Capsule By Mouth Once A Day 4)  Menostar 14 Mcg/24hr Ptwk (Estradiol) .... Holding 5)  Pravachol 40 Mg Tabs (Pravastatin Sodium) .Marland Kitchen.. 1 Tab By Mouth Once Daily 6)  Allergy Vaccine  1:10 Go (W-E) .... Once Weekly 7)  Zyrtec Allergy 10 Mg Tabs (Cetirizine Hcl) .... Take 1 Tablet By Mouth Once A Day As Needed 8)  Flax Seed Oil 1000 Mg Caps (Flaxseed (Linseed)) .... Take 1 By Mouth Once Daily 9)  Fish Oil 1000 Mg Caps (Omega-3 Fatty Acids) .... Take 1 By Mouth Once Daily 10)  Glucosamine-Chondroitin   Caps (Glucosamine-Chondroit-Vit C-Mn) .... Take 1 Tablet By Mouth Once Daily 11)  Vitamin C 500 Mg  Tabs (Ascorbic Acid) .... Take 1 Tablet By Mouth Once Daily 12)  Calcium-Magnesium-Zinc 333-133-5 Mg Tabs (Calcium-Magnesium-Zinc) .... Take 1 Tablet By Mouth Once Daily 13)  Warfarin Sodium 5 Mg Tabs (Warfarin Sodium) .... Take As Directed 14)  Vicodin Es 7.5-750 Mg Tabs (Hydrocodone-Acetaminophen) .Marland Kitchen.. 1 Tablet Every 6 Hours 15)  Triamterene-Hctz 37.5-25 Mg Tabs (Triamterene-Hctz) .... Take 1 Tablet Daily 16)  Cyclobenzaprine Hcl 10 Mg Tabs (Cyclobenzaprine Hcl)  .... Take 1 Tablet Every 8 Hours 17)  Omeprazole 20 Mg Cpdr (Omeprazole) .Marland Kitchen.. 1 Tab By Mouth Once Daily 18)  Metoprolol Succinate 100 Mg Xr24h-Tab (Metoprolol Succinate) .... One Daily 19)  Pramipexole Dihydrochloride 0.5 Mg Tabs (Pramipexole Dihydrochloride) .... As Needed Restless Legs  Allergies (verified): No Known Drug Allergies  Anticoagulation Management History:      The patient is taking warfarin and comes in today for a routine follow up visit.  Positive risk factors for bleeding include an age of 15 years or older.  The bleeding index is 'intermediate risk'.  Negative CHADS2 values include Age > 53 years old.  The start date was 12/21/2006.  Anticoagulation responsible provider: Excell Seltzer MD, Casimiro Needle.  INR POC: 3.0.  Cuvette Lot#: [id number].  Exp: 09/2010.    Anticoagulation Management Assessment/Plan:      The patient's current anticoagulation dose is Warfarin sodium 5 mg tabs: Take as directed.  The target INR is 2.0-3.0.  The next INR is due 08/04/2009.  Anticoagulation instructions were given to patient/spouse.  Results were reviewed/authorized by Cloyde Reams, RN.  She was notified by Cloyde Reams RN.         Prior Anticoagulation Instructions: INR 3.8  Skip today's dose then continue same dose of 0.5 tablet daily except 1 tablet on Mondays and Fridays. Recheck in 2 weeks.  Current Anticoagulation Instructions: INR 3.0  Take 1/2 tablet today then resume same dosage 1/2 tablet daily except 1  tablet on Mondays and Fridays.  Recheck in 3 weeks.

## 2010-07-07 NOTE — Medication Information (Signed)
Summary: rov/eac  Anticoagulant Therapy  Managed by: Leota Sauers, PharmD, BCPS, CPP Referring MD: Charlton Haws MD PCP: Ihor Austin MD: Ladona Ridgel MD, Sharlot Gowda Indication 1: Atrial Fibrillation (ICD-427.31) Lab Used: [facility] Site: [facility] INR POC 2.4 INR RANGE 2 - 3  Dietary changes: no    Health status changes: no    Bleeding/hemorrhagic complications: no    Recent/future hospitalizations: no    Any changes in medication regimen? yes       Details: mucinex and robitussin for cough  Recent/future dental: no  Any missed doses?: no       Is patient compliant with meds? yes       Current Medications (verified): 1)  Xalatan 0.005 % Soln (Latanoprost) .... As Directed 2)  Centrum Silver  Tabs (Multiple Vitamins-Minerals) .... Take 1 Tablet By Mouth Once A Day 3)  Diltiazem Hcl Cr 240 Mg Xr24h-Cap (Diltiazem Hcl) .... Take 1 Capsule By Mouth Once A Day 4)  Menostar 14 Mcg/24hr Ptwk (Estradiol) .... Holding 5)  Pravachol 40 Mg Tabs (Pravastatin Sodium) .Marland Kitchen.. 1 Tab By Mouth Once Daily 6)  Allergy Vaccine  1:10 Go (W-E) .... Once Weekly 7)  Zyrtec Allergy 10 Mg Tabs (Cetirizine Hcl) .... Take 1 Tablet By Mouth Once A Day As Needed 8)  Flax Seed Oil 1000 Mg Caps (Flaxseed (Linseed)) .... Take 1 By Mouth Once Daily 9)  Fish Oil 1000 Mg Caps (Omega-3 Fatty Acids) .... Take 1 By Mouth Once Daily 10)  Glucosamine-Chondroitin   Caps (Glucosamine-Chondroit-Vit C-Mn) .... Take 1 Tablet By Mouth Once Daily 11)  Vitamin C 500 Mg  Tabs (Ascorbic Acid) .... Take 1 Tablet By Mouth Once Daily 12)  Calcium-Magnesium-Zinc 333-133-5 Mg Tabs (Calcium-Magnesium-Zinc) .... Take 1 Tablet By Mouth Once Daily 13)  Warfarin Sodium 5 Mg Tabs (Warfarin Sodium) .... Take As Directed 14)  Vicodin Es 7.5-750 Mg Tabs (Hydrocodone-Acetaminophen) .Marland Kitchen.. 1 Tablet Every 6 Hours 15)  Triamterene-Hctz 37.5-25 Mg Tabs (Triamterene-Hctz) .... Take 1 Tablet Daily 16)  Cyclobenzaprine Hcl 10 Mg Tabs  (Cyclobenzaprine Hcl) .... Take 1 Tablet Every 8 Hours 17)  Omeprazole 20 Mg Cpdr (Omeprazole) .Marland Kitchen.. 1 Tab By Mouth Once Daily 18)  Metoprolol Succinate 100 Mg Xr24h-Tab (Metoprolol Succinate) .... One Daily 19)  Pramipexole Dihydrochloride 0.5 Mg Tabs (Pramipexole Dihydrochloride) .... As Needed Restless Legs  Allergies (verified): No Known Drug Allergies  Anticoagulation Management History:      The patient is taking warfarin and comes in today for a routine follow up visit.  Positive risk factors for bleeding include an age of 68 years or older.  The bleeding index is 'intermediate risk'.  Negative CHADS2 values include Age > 15 years old.  The start date was 12/21/2006.  Anticoagulation responsible provider: Ladona Ridgel MD, Sharlot Gowda.  INR POC: 2.4.  Cuvette Lot#: [id number].  Exp: 10/2010.    Anticoagulation Management Assessment/Plan:      The patient's current anticoagulation dose is Warfarin sodium 5 mg tabs: Take as directed.  The target INR is 2.0-3.0.  The next INR is due 10/02/2009.  Anticoagulation instructions were given to patient.  Results were reviewed/authorized by Leota Sauers, PharmD, BCPS, CPP.         Prior Anticoagulation Instructions: INR 2.0  Start NEW dosing schedule of 1 tablet on Monday and 1/2 tablet all other days.  Return to clinic in 3 weeks.    Current Anticoagulation Instructions: INR 2.4  Coumadin 1 tab = 5mg  on Mon  1/2 tab = 2.5mg  all other days

## 2010-07-07 NOTE — Medication Information (Signed)
Summary: rov/sel  Anticoagulant Therapy  Managed by: Bethena Midget, RN, BSN Referring MD: Charlton Haws MD PCP: Ihor Austin MD: Antoine Poche MD, Fayrene Fearing Indication 1: Atrial Fibrillation (ICD-427.31) Lab Used: [facility] Site: [facility] INR POC 2.5 INR RANGE 2 - 3  Dietary changes: no    Health status changes: no    Bleeding/hemorrhagic complications: no    Recent/future hospitalizations: no    Any changes in medication regimen? no    Recent/future dental: no  Any missed doses?: no       Is patient compliant with meds? yes       Current Medications (verified): 1)  Centrum Silver  Tabs (Multiple Vitamins-Minerals) .... Take 1 Tablet By Mouth Once A Day 2)  Diltiazem Hcl Cr 240 Mg Xr24h-Cap (Diltiazem Hcl) .... Take 1 Capsule By Mouth Once A Day 3)  Pravachol 40 Mg Tabs (Pravastatin Sodium) .Marland Kitchen.. 1 Tab By Mouth Once Daily 4)  Allergy Vaccine  1:10 Go (W-E) .... Once Weekly 5)  Flax Seed Oil 1200 Mg Caps (Flaxseed (Linseed)) .... Take 1 By Mouth Once Daily 6)  Fish Oil 1200 Mg Caps (Omega-3 Fatty Acids) .... Take 1 By Mouth Once Daily 7)  Vitamin C 500 Mg  Tabs (Ascorbic Acid) .... Take 1 Tablet By Mouth Once Daily 8)  Calcium-Magnesium-Zinc 333-133-5 Mg Tabs (Calcium-Magnesium-Zinc) .... Take 1 Tablet By Mouth Once Daily 9)  Warfarin Sodium 5 Mg Tabs (Warfarin Sodium) .... Take As Directed 10)  Triamterene-Hctz 37.5-25 Mg Tabs (Triamterene-Hctz) .... Take 1 Tablet Daily 11)  Omeprazole 20 Mg Cpdr (Omeprazole) .Marland Kitchen.. 1 Tab By Mouth Once Daily 12)  Metoprolol Succinate 100 Mg Xr24h-Tab (Metoprolol Succinate) .... One Daily 13)  Pramipexole Dihydrochloride 0.5 Mg Tabs (Pramipexole Dihydrochloride) .... Take 1/2 To 1 Tablet By Mouth At Bedtime 14)  Allegra 180 Mg Tabs (Fexofenadine Hcl) .Marland Kitchen.. 1 Tablet By Mouth Daily As Needed For Allergies 15)  Lumigan 0.01 % Soln (Bimatoprost) .... As Idrected 16)  Furosemide 20 Mg Tabs (Furosemide) .... Take One Tablet By Mouth Daily As  Needed For Swelling  Allergies (verified): No Known Drug Allergies  Anticoagulation Management History:      The patient is taking warfarin and comes in today for a routine follow up visit.  Positive risk factors for bleeding include an age of 68 years or older.  The bleeding index is 'intermediate risk'.  Negative CHADS2 values include Age > 68 years old.  The start date was 12/21/2006.  Anticoagulation responsible Holman Bonsignore: Antoine Poche MD, Fayrene Fearing.  INR POC: 2.5.  Cuvette Lot#: [id number].  Exp: 04/2011.    Anticoagulation Management Assessment/Plan:      The patient's current anticoagulation dose is Warfarin sodium 5 mg tabs: Take as directed.  The target INR is 2.0-3.0.  The next INR is due 05/14/2010.  Anticoagulation instructions were given to patient.  Results were reviewed/authorized by Bethena Midget, RN, BSN.  She was notified by Bethena Midget, RN, BSN.         Prior Anticoagulation Instructions: INR 2.7  Continue taking 1/2 tablet everyday except 1 tablet on Monday. Recheck in 4 weeks.   Current Anticoagulation Instructions: INR 2.5 Continue 2.5mg s everyday except 5mg s on Mondays. Recheck in 4 weeks.

## 2010-07-07 NOTE — Letter (Signed)
Summary: [facility]   Imported By: Kassie Mends 02/25/2009 09:50:48  _____________________________________________________________________  External Attachment:    Type:   Image     Comment:   External Document

## 2010-07-07 NOTE — Medication Information (Signed)
Summary: rov/cb  Anticoagulant Therapy  Managed by: Weston Brass, PharmD Referring MD: Charlton Haws MD PCP: Ihor Austin MD: Shirlee Latch MD, Freida Busman Indication 1: Atrial Fibrillation (ICD-427.31) Lab Used: [facility] Site: [facility] INR POC 2.5 INR RANGE 2 - 3  Dietary changes: no    Health status changes: no    Bleeding/hemorrhagic complications: no    Recent/future hospitalizations: yes       Details: pt had cataract surgery 2 weeks ago on 7/6  Any changes in medication regimen? yes       Details: pt is taking bromday and pred forte eye drops as post cataract surgery for 2 more weeks  Recent/future dental: no  Any missed doses?: no       Is patient compliant with meds? yes       Current Medications (verified): 1)  Centrum Silver  Tabs (Multiple Vitamins-Minerals) .... Take 1 Tablet By Mouth Once A Day 2)  Diltiazem Hcl Cr 240 Mg Xr24h-Cap (Diltiazem Hcl) .... Take 1 Capsule By Mouth Once A Day 3)  Menostar 14 Mcg/24hr Ptwk (Estradiol) .... Apply Patch Topically Every Week 4)  Pravachol 40 Mg Tabs (Pravastatin Sodium) .Marland Kitchen.. 1 Tab By Mouth Once Daily 5)  Allergy Vaccine  1:10 Go (W-E) .... Once Weekly 6)  Flax Seed Oil 1000 Mg Caps (Flaxseed (Linseed)) .... Take 1 By Mouth Once Daily 7)  Fish Oil 1000 Mg Caps (Omega-3 Fatty Acids) .... Take 1 By Mouth Once Daily 8)  Glucosamine-Chondroitin   Caps (Glucosamine-Chondroit-Vit C-Mn) .... Take 1 Tablet By Mouth Once Daily 9)  Vitamin C 500 Mg  Tabs (Ascorbic Acid) .... Take 1 Tablet By Mouth Once Daily 10)  Calcium-Magnesium-Zinc 333-133-5 Mg Tabs (Calcium-Magnesium-Zinc) .... Take 1 Tablet By Mouth Once Daily 11)  Warfarin Sodium 5 Mg Tabs (Warfarin Sodium) .... Take As Directed 12)  Triamterene-Hctz 37.5-25 Mg Tabs (Triamterene-Hctz) .... Take 1 Tablet Daily 13)  Cyclobenzaprine Hcl 10 Mg Tabs (Cyclobenzaprine Hcl) .... Take 1 Tablet Every 8 Hours 14)  Omeprazole 20 Mg Cpdr (Omeprazole) .Marland Kitchen.. 1 Tab By Mouth Once Daily 15)   Metoprolol Succinate 100 Mg Xr24h-Tab (Metoprolol Succinate) .... One Daily 16)  Pramipexole Dihydrochloride 0.5 Mg Tabs (Pramipexole Dihydrochloride) .... Take 1/2 To 1 Tablet By Mouth At Bedtime 17)  Clonazepam 0.5 Mg Tabs (Clonazepam) .Marland Kitchen.. 1-2 For Sleep / Dream Activity 18)  Allegra 180 Mg Tabs (Fexofenadine Hcl) .Marland Kitchen.. 1 Tablet By Mouth Daily As Needed For Allergies 19)  Lumigan 0.01 % Soln (Bimatoprost)  Allergies: No Known Drug Allergies  Anticoagulation Management History:      The patient is taking warfarin and comes in today for a routine follow up visit.  Positive risk factors for bleeding include an age of 68 years or older.  The bleeding index is 'intermediate risk'.  Negative CHADS2 values include Age > 55 years old.  The start date was 12/21/2006.  Anticoagulation responsible provider: Shirlee Latch MD, Jaidan Prevette.  INR POC: 2.5.  Cuvette Lot#: [id number].  Exp: 03/2011.    Anticoagulation Management Assessment/Plan:      The patient's current anticoagulation dose is Warfarin sodium 5 mg tabs: Take as directed.  The target INR is 2.0-3.0.  The next INR is due 01/21/2010.  Anticoagulation instructions were given to patient.  Results were reviewed/authorized by Weston Brass, PharmD.  She was notified by Dillard Cannon.         Prior Anticoagulation Instructions: INR 2.9. Take 0.5 tablet daily except 1 tablet on Mondays. Recheck in 4 weeks.  Current  Anticoagulation Instructions: INR 2.5  Continue same dose of 1 tab on Monday and 1/2 tab all other days.  Re-check in 4 weeks.

## 2010-07-07 NOTE — Medication Information (Signed)
Summary: rov/ewj  Anticoagulant Therapy  Managed by: Cloyde Reams, RN, BSN Referring MD: Charlton Haws MD PCP: Ihor Austin MD: Riley Kill MD, Maisie Fus Indication 1: Atrial Fibrillation (ICD-427.31) Lab Used: [facility] Site: [facility] PT 17.9 INR POC 2.1 INR RANGE 2 - 3  Dietary changes: yes       [place].    Health status changes: no    Bleeding/hemorrhagic complications: no    Recent/future hospitalizations: no    Any changes in medication regimen? no    Recent/future dental: no  Any missed doses?: no       Is patient compliant with meds? yes       Current Medications (verified): 1)  Xalatan 0.005 % Soln (Latanoprost) .... As Directed 2)  Centrum Silver  Tabs (Multiple Vitamins-Minerals) .... Take 1 Tablet By Mouth Once A Day 3)  Diltiazem Hcl Cr 240 Mg Xr24h-Cap (Diltiazem Hcl) .... Take 1 Capsule By Mouth Once A Day 4)  Menostar 14 Mcg/24hr Ptwk (Estradiol) .... Apply Patch Once Weekly 5)  Pravastatin Sodium 20 Mg Tabs (Pravastatin Sodium) .... Take 1 Tab By Mouth At Bedtime 6)  Allergy Vaccine  1:10 Go (W-E) .... Once Weekly 7)  Zyrtec Allergy 10 Mg Tabs (Cetirizine Hcl) .... Take 1 Tablet By Mouth Once A Day As Needed 8)  Flaxseed Oil 1200 Mg Caps (Flaxseed (Linseed)) .... Take 1 Tablet By Mouth Once A Day 9)  Clonazepam 0.5 Mg Tabs (Clonazepam) .Marland Kitchen.. 1 For Sleep If Needed 10)  Coumadin 5 Mg Tabs (Warfarin Sodium) .... Take As Directed By Anticoagulation Clinic 11)  Prilosec 20 Mg Cpdr (Omeprazole) .... Take 1 Capsule By Mouth Once Daily 12)  Fish Oil 1200 Mg Caps (Omega-3 Fatty Acids) .... Take 1 Capsule By Mouth Once Daily 13)  Red Yeast Rice 600 Mg Caps (Red Yeast Rice Extract) .... Take 2 Capsules By Mouth Once Daily 14)  Glucosamine-Chondroitin   Caps (Glucosamine-Chondroit-Vit C-Mn) .... Take 1 Tablet By Mouth Once Daily 15)  Vitamin C 500 Mg  Tabs (Ascorbic Acid) .... Take 1 Tablet By Mouth Once  Daily 16)  Calcium-Magnesium-Zinc 333-133-5 Mg Tabs (Calcium-Magnesium-Zinc) .... Take 1 Tablet By Mouth Once Daily 17)  Tramadol Hcl 50 Mg Tabs (Tramadol Hcl) .... Take 1 Tablet Every 4 Hours As Needed For Pain 18)  Dilt-Cd 180 Mg Xr24h-Cap (Diltiazem Hcl Coated Beads) .... Take 1 Capsule By Mouth Once A Day 19)  Warfarin Sodium 5 Mg Tabs (Warfarin Sodium) .... Take As Directed  Allergies (verified): No Known Drug Allergies  Anticoagulation Management History:      The patient is taking warfarin and comes in today for a routine follow up visit.  Positive risk factors for bleeding include an age of 68 years or older.  The bleeding index is 'intermediate risk'.  Negative CHADS2 values include Age > 76 years old.  The start date was 12/21/2006.  Prothrombin time is 17.9.  Anticoagulation responsible provider: Riley Kill MD, Maisie Fus.  INR POC: 2.1.  Cuvette Lot#: [id number].  Exp: 12/2009.    Anticoagulation Management Assessment/Plan:      The patient's current anticoagulation dose is Coumadin 5 mg tabs: Take as directed by Anticoagulation Clinic, Warfarin sodium 5 mg tabs: Take as directed.  The target INR is 2.0-3.0.  The next INR is due 02/04/2009.  Anticoagulation instructions were given to patient.  Results were reviewed/authorized by Cloyde Reams, RN, BSN.  She was notified by Cloyde Reams RN.  Prior Anticoagulation Instructions: INR 1.9  Start on 1 tablet Mondays and Fridays.  1/2 tablet all other days.  Recheck in 2 weeks.    Current Anticoagulation Instructions: INR 2.1  Continue on same dosage 1 tablet on Mondays and Fridays, and 1/2 tablet all other days.  Recheck in 3 weeks.

## 2010-07-07 NOTE — Progress Notes (Signed)
Summary: dose pt need sur. clearence  Phone Note Call from Patient Call back at Home Phone [phone] Call back at [phone]   Caller: Patient Reason for Call: Talk to Nurse, Talk to Doctor Summary of Call: pt having bck problem and will she need to come get clearence if they want to do surgery Initial call taken by: Omer Jack,  May 15, 2009 2:53 PM  Follow-up for Phone Call        spoke with pt, she is seeing the md about her back next week and is thinking she is going to have to have surgery again. she questioned if needed to see dr [name] for clearence. pt was last seen in jan of this year and told she would need to be seen for clearence. she will call after seeing the md next week to schedule appt. Deliah Goody, RN  May 15, 2009 5:21 PM

## 2010-07-07 NOTE — Medication Information (Signed)
Summary: rov/tm  Anticoagulant Therapy  Managed by: Cloyde Reams, RN Referring MD: Charlton Haws MD PCP: Ihor Austin MD: Antoine Poche MD, Fayrene Fearing Indication 1: Atrial Fibrillation (ICD-427.31) Lab Used: [facility] Site: [facility] INR POC 3.8 INR RANGE 2 - 3  Dietary changes: yes       Details: Pt. has eaten less Vitamin K this week.  Health status changes: no    Bleeding/hemorrhagic complications: no    Recent/future hospitalizations: no    Any changes in medication regimen? no    Recent/future dental: no  Any missed doses?: no       Is patient compliant with meds? yes       Current Medications (verified): 1)  Xalatan 0.005 % Soln (Latanoprost) .... As Directed 2)  Centrum Silver  Tabs (Multiple Vitamins-Minerals) .... Take 1 Tablet By Mouth Once A Day 3)  Diltiazem Hcl Cr 240 Mg Xr24h-Cap (Diltiazem Hcl) .... Take 1 Capsule By Mouth Once A Day 4)  Menostar 14 Mcg/24hr Ptwk (Estradiol) .... Apply Patch Once Weekly 5)  Pravachol 40 Mg Tabs (Pravastatin Sodium) .Marland Kitchen.. 1 Tab By Mouth Once Daily 6)  Allergy Vaccine  1:10 Go (W-E) .... Once Weekly 7)  Zyrtec Allergy 10 Mg Tabs (Cetirizine Hcl) .... Take 1 Tablet By Mouth Once A Day As Needed 8)  Flax Seed Oil 1000 Mg Caps (Flaxseed (Linseed)) .... Take 1 By Mouth Once Daily 9)  Fish Oil 1000 Mg Caps (Omega-3 Fatty Acids) .... Take 1 By Mouth Once Daily 10)  Glucosamine-Chondroitin   Caps (Glucosamine-Chondroit-Vit C-Mn) .... Take 1 Tablet By Mouth Once Daily 11)  Vitamin C 500 Mg  Tabs (Ascorbic Acid) .... Take 1 Tablet By Mouth Once Daily 12)  Calcium-Magnesium-Zinc 333-133-5 Mg Tabs (Calcium-Magnesium-Zinc) .... Take 1 Tablet By Mouth Once Daily 13)  Warfarin Sodium 5 Mg Tabs (Warfarin Sodium) .... Take As Directed 14)  Vicodin Es 7.5-750 Mg Tabs (Hydrocodone-Acetaminophen) .Marland Kitchen.. 1 Tablet Every 6 Hours 15)  Triamterene-Hctz 37.5-25 Mg Tabs (Triamterene-Hctz) .... Take 1 Tablet Daily 16)  Cyclobenzaprine Hcl 10 Mg Tabs  (Cyclobenzaprine Hcl) .... Take 1 Tablet Every 8 Hours 17)  Omeprazole 20 Mg Cpdr (Omeprazole) .Marland Kitchen.. 1 Tab By Mouth Once Daily 18)  Metoprolol Succinate 100 Mg Xr24h-Tab (Metoprolol Succinate) .... One Daily  Allergies (verified): No Known Drug Allergies  Anticoagulation Management History:      The patient is taking warfarin and comes in today for a routine follow up visit.  Positive risk factors for bleeding include an age of 12 years or older.  The bleeding index is 'intermediate risk'.  Negative CHADS2 values include Age > 25 years old.  The start date was 12/21/2006.  Anticoagulation responsible provider: Antoine Poche MD, Fayrene Fearing.  INR POC: 3.8.  Cuvette Lot#: [id number].  Exp: 09/2010.    Anticoagulation Management Assessment/Plan:      The patient's current anticoagulation dose is Warfarin sodium 5 mg tabs: Take as directed.  The target INR is 2.0-3.0.  The next INR is due 07/14/2009.  Anticoagulation instructions were given to patient/spouse.  Results were reviewed/authorized by Cloyde Reams, RN.  She was notified by Lew Dawes, PharmD Candidate.         Prior Anticoagulation Instructions: INR 2.8 Continue 2.5mg s everyday except 5mg s on Mondays and Fridays. Recheck INR in 2 weeks.   Current Anticoagulation Instructions: INR 3.8  Skip today's dose then continue same dose of 0.5 tablet daily except 1 tablet on Mondays and Fridays. Recheck in 2 weeks.

## 2010-07-07 NOTE — Medication Information (Signed)
Summary: rov/ln  Anticoagulant Therapy  Managed by: Reina Fuse, PharmD Referring MD: Charlton Haws MD PCP: Ihor Austin MD: Juanda Chance MD, Bruce Indication 1: Atrial Fibrillation (ICD-427.31) Lab Used: [facility] Site: [facility] INR POC 2.3 INR RANGE 2 - 3  Dietary changes: no    Health status changes: no    Bleeding/hemorrhagic complications: no    Recent/future hospitalizations: no    Any changes in medication regimen? yes       Details: doxycycline x 5 days  Recent/future dental: no  Any missed doses?: no       Is patient compliant with meds? yes      Comments: Tick bite, started on doxycline for 5 days, last dose today  Allergies: No Known Drug Allergies  Anticoagulation Management History:      The patient is taking warfarin and comes in today for a routine follow up visit.  Positive risk factors for bleeding include an age of 5 years or older.  The bleeding index is 'intermediate risk'.  Negative CHADS2 values include Age > 12 years old.  The start date was 12/21/2006.  Anticoagulation responsible Ashlei Chinchilla: Juanda Chance MD, Smitty Cords.  INR POC: 2.3.  Cuvette Lot#: [id number].  Exp: 03/2011.    Anticoagulation Management Assessment/Plan:      The patient's current anticoagulation dose is Warfarin sodium 5 mg tabs: Take as directed.  The target INR is 2.0-3.0.  The next INR is due 02/19/2010.  Anticoagulation instructions were given to patient.  Results were reviewed/authorized by Reina Fuse, PharmD.  She was notified by Reina Fuse PharmD.         Prior Anticoagulation Instructions: INR 2.5  Continue same dose of 1 tab on Monday and 1/2 tab all other days.  Re-check in 4 weeks.  Current Anticoagulation Instructions: INR 2.3  Continue taking Coumadin 0.5 tab (2.5 mg) on all days except Coumadin 1 tab (5 mg) on Mondays. Return to clinic in 4 weeks.

## 2010-07-07 NOTE — Progress Notes (Signed)
Summary: questions re upcoming surgery/dc'ing coumadin  Phone Note Call from Patient Call back at Home Phone [phone]   Caller: Patient Reason for Call: Talk to Nurse Summary of Call: Patient needs surgery (back) as soon as possible.  She takes Coumadin and needs to know about discontinuing prior to the procedure.   Does she need to see Dr. [name] prior, as well? Initial call taken by: Burnard Leigh,  May 20, 2009 12:54 PM  Follow-up for Phone Call        spoke with pt, she was seen by surgeon today and has spinal fluid leaking in her back. she needs to have surgery asap. will schedule to see the pa tomorrow at 8:15am Deliah Goody, RN  May 20, 2009 5:11 PM

## 2010-07-07 NOTE — Medication Information (Signed)
Summary: rov/tm  Anticoagulant Therapy  Managed by: Weston Brass, PharmD Referring MD: Charlton Haws MD PCP: Ihor Austin MD: Graciela Husbands MD, Viviann Spare Indication 1: Atrial Fibrillation (ICD-427.31) Lab Used: [facility] Site: [facility] INR POC 2.3 INR RANGE 2 - 3  Dietary changes: no    Health status changes: no    Bleeding/hemorrhagic complications: no    Recent/future hospitalizations: no    Any changes in medication regimen? no    Recent/future dental: no  Any missed doses?: no       Is patient compliant with meds? yes       Allergies: No Known Drug Allergies  Anticoagulation Management History:      The patient is taking warfarin and comes in today for a routine follow up visit.  Positive risk factors for bleeding include an age of 68 years or older.  The bleeding index is 'intermediate risk'.  Negative CHADS2 values include Age > 17 years old.  The start date was 12/21/2006.  Anticoagulation responsible Ryin Schillo: Graciela Husbands MD, Viviann Spare.  INR POC: 2.3.  Cuvette Lot#: [id number].  Exp: 07/2011.    Anticoagulation Management Assessment/Plan:      The patient's current anticoagulation dose is Warfarin sodium 5 mg tabs: Take as directed.  The target INR is 2.0-3.0.  The next INR is due 06/11/2010.  Anticoagulation instructions were given to patient.  Results were reviewed/authorized by Weston Brass, PharmD.  She was notified by Weston Brass PharmD.         Prior Anticoagulation Instructions: INR 2.5 Continue 2.5mg s everyday except 5mg s on Mondays. Recheck in 4 weeks.   Current Anticoagulation Instructions: INR 2.3  Continue same dose of 1/2 tablet every day except 1 tablet on Monday.  Recheck INR in 4 weeks.

## 2010-07-09 NOTE — Medication Information (Signed)
Summary: rov/sp  Anticoagulant Therapy  Managed by: Weston Brass, PharmD Referring MD: Charlton Haws MD PCP: Ihor Austin MD: Myrtis Ser MD, Tinnie Gens Indication 1: Atrial Fibrillation (ICD-427.31) Lab Used: [facility] Site: [facility] INR POC 2.6 INR RANGE 2 - 3  Dietary changes: no    Health status changes: no    Bleeding/hemorrhagic complications: no    Recent/future hospitalizations: no    Any changes in medication regimen? yes       Details: Glucosamine/Chondroiton and Methyprednisolone 6-day taper   Recent/future dental: no  Any missed doses?: no       Is patient compliant with meds? yes       Allergies: No Known Drug Allergies  Anticoagulation Management History:      The patient is taking warfarin and comes in today for a routine follow up visit.  Positive risk factors for bleeding include an age of 68 years or older.  The bleeding index is 'intermediate risk'.  Negative CHADS2 values include Age > 64 years old.  The start date was 12/21/2006.  Anticoagulation responsible provider: Myrtis Ser MD, Tinnie Gens.  INR POC: 2.6.  Cuvette Lot#: [id number].  Exp: 07/2011.    Anticoagulation Management Assessment/Plan:      The patient's current anticoagulation dose is Warfarin sodium 5 mg tabs: Take as directed.  The target INR is 2.0-3.0.  The next INR is due 07/09/2010.  Anticoagulation instructions were given to patient.  Results were reviewed/authorized by Weston Brass, PharmD.  She was notified by Stephannie Peters, PharmD Candidate.         Prior Anticoagulation Instructions: INR 2.3  Continue same dose of 1/2 tablet every day except 1 tablet on Monday.  Recheck INR in 4 weeks.   Current Anticoagulation Instructions: INR 2.6   Coumadin 5 mg tablets - Continue 1/2 tablet every day except 1 tablet on Mondays.   Return to clinic in 4 weeks on 07/09/10

## 2010-07-09 NOTE — Assessment & Plan Note (Signed)
Summary: Surgical Clearance   Visit Type:  Follow-up Primary Provider:  Ehinger   History of Present Illness: Primary Cardiologist:  Dr. Charlton Haws  [name] is a 70 yo female with a history of paroxysmal atrial fibrillation on chronic Coumadin therapy.  She has a history of chest discomfort.  She had heart catheterization in Feb. 2002 that demonstrated normal coronary arteries and normal LV function.  A Myoview study done in July 2009 demonstrated no ischemia.  Her last echocardiogram done in July 2008 demonstrated an ejection fraction of 60%, mild LVH, mild LAE.  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  She's had multiple back surgeries.  She presents today for surgical clearance.  She has footdrop on the left.  She is not really that much pain.  Dr. Phoebe Perch is her surgeon.  She has been able to remain fairly active.  She denies any exertional chest discomfort or shortness of breath.  She can rake leaves without chest discomfort or shortness of breath.  She did this 2 days ago.  She denies orthopnea, PND.  She has some occasional mild pedal edema.  She had some indigestion this morning.  She thinks that she ate too late last night and was awoken by the wind early in the morning.  She denies any tachycardia palpitations.  Current Medications (verified): 1)  Centrum Silver  Tabs (Multiple Vitamins-Minerals) .... Take 1 Tablet By Mouth Once A Day 2)  Diltiazem Hcl Cr 240 Mg Xr24h-Cap (Diltiazem Hcl) .... Take 1 Capsule By Mouth Once A Day 3)  Pravachol 40 Mg Tabs (Pravastatin Sodium) .Marland Kitchen.. 1 Tab By Mouth Once Daily 4)  Allergy Vaccine  1:10 Go (W-E) .... Once Weekly 5)  Flax Seed Oil 1200 Mg Caps (Flaxseed (Linseed)) .... Take 1 By Mouth Once Daily 6)  Fish Oil 1200 Mg Caps (Omega-3 Fatty Acids) .... Take 1 By Mouth Once Daily 7)  Vitamin C 500 Mg  Tabs  (Ascorbic Acid) .... Take 1 Tablet By Mouth Once Daily 8)  Calcium-Magnesium-Zinc 333-133-5 Mg Tabs (Calcium-Magnesium-Zinc) .... Take 1 Tablet By Mouth Once Daily 9)  Warfarin Sodium 5 Mg Tabs (Warfarin Sodium) .... Take As Directed 10)  Triamterene-Hctz 37.5-25 Mg Tabs (Triamterene-Hctz) .... Take 1 Tablet Daily 11)  Omeprazole 20 Mg Cpdr (Omeprazole) .Marland Kitchen.. 1 Tab By Mouth Once Daily 12)  Metoprolol Succinate 100 Mg Xr24h-Tab (Metoprolol Succinate) .... One Daily 13)  Pramipexole Dihydrochloride 0.5 Mg Tabs (Pramipexole Dihydrochloride) .... Take 1/2 To 1 Tablet By Mouth At Bedtime 14)  Allegra 180 Mg Tabs (Fexofenadine Hcl) .Marland Kitchen.. 1 Tablet By Mouth Daily As Needed For Allergies 15)  Lumigan 0.01 % Soln (Bimatoprost) .... As Idrected 16)  Glucosamine-Chondroitin 250-200 Mg Caps (Glucosamine-Chondroitin) .... One Tablet Once Daily  Allergies (verified): No Known Drug Allergies  Past History:  Past Medical History: Last updated: 04/23/2008 PAF/ coumadin Hypertension DJD Allergic rhinitis       Vaccine Hyperlipidemia Sleep Apnea  NPSG 01/25/08 AHI 6.3        CPAP auto- titrated to 10  Social History: Reviewed history from 03/04/2008 and no changes required. Patient never smoked.  Pt is married, and has 2 children. Pt is currently employeed in clerical office work. Husband immunosuppressed after heart transplant in 2000  Review of Systems       As per  the HPI.  All other systems reviewed  and negative.   Vital Signs:  Patient profile:   70 year old female Height:      66 inches Weight:      195 pounds BMI:     31.59 Pulse rate:   66 / minute BP sitting:   118 / 72  (left arm)  Vitals Entered By: Laurance Flatten CMA (June 19, 2010 9:29 AM)  Physical Exam  General:  Well nourished, well developed, in no acute distress HEENT: normal Neck: no JVD Cardiac:  normal S1, S2; RRR; no murmur Lungs:  clear to auscultation bilaterally, no wheezing, rhonchi or rales Abd: soft,  nontender, no hepatomegaly Ext: trace bilat. edema Vascular: no carotid  bruits Skin: warm and dry Neuro:  CNs 2-12 intact, no focal abnormalities noted    EKG  Procedure date:  06/19/2010  Findings:      normal sinus rhythm PACs Heart rate 86 Normal axis Nonspecific ST-T wave changes Q wave in V1 and V2 no significant change since previous tracing  Impression & Recommendations:  Problem # 1:  ATRIAL FIBRILLATION (ICD-427.31) She is maintaining normal sinus rhythm.  She should remain on her diltiazem and Toprol throughout the perioperative period.  She does not have a history of stroke or valve replacement.  She has been off of Coumadin in the past for surgeries.  She should be able to hold her Coumadin several days prior to her surgery and restart it once it is felt to be safe postoperatively.  Problem # 2:  PRE-OPERATIVE CARDIOVASCULAR EXAMINATION (ICD-V72.81) She does not have any unstable cardiac conditions.  She had a normal heart catheterization in 2002.  She had a nonischemic Myoview study in July 2009.  She has a history of normal LV function.  She is able to achieve 4 METS or greater without chest pain or shortness of breath.  She is remaining in normal sinus rhythm.  She does not have any signs or symptoms of congestive heart failure.  According to [facility] and AHA guidelines, she requires no further cardiac workup prior to noncardiac surgery.  She should be at acceptable risk.  Our service will be available in the perioperative period as necessary.  Patient Instructions: 1)  Your physician recommends that you schedule a follow-up appointment in: March 2012 with Dr. [name]. 2)  Your physician recommends that you continue on your current medications as directed. Please refer to the Current Medication list given to you today.

## 2010-07-09 NOTE — Letter (Signed)
Summary: [facility] Progress Note   [facility] Progress Note   Imported By: Roderic Ovens 06/02/2010 16:17:01  _____________________________________________________________________  External Attachment:    Type:   Image     Comment:   External Document

## 2010-07-09 NOTE — Op Note (Signed)
[name], [name]           ACCOUNT NO.:  [id number]  MEDICAL RECORD NO.:  [id number]          PATIENT TYPE:  INP  LOCATION:  3113                         FACILITY:  [facility]  PHYSICIAN:  Clydene Fake, M.D.  DATE OF BIRTH:  11-07-1940  DATE OF PROCEDURE:  06/30/2010 DATE OF DISCHARGE:                              OPERATIVE REPORT   PREOPERATIVE DIAGNOSIS:  Lumbar stenosis, spondylosis, scoliosis with radiculopathy L2 through S1 with prior surgery.  POSTOPERATIVE DIAGNOSIS:  Lumbar stenosis, spondylosis, scoliosis with radiculopathy L2 through S1 with prior surgery.  PROCEDURE:  Redo decompressive laminectomy decompressing the L2, L3, L4, L5, and S1 roots (bi-levels), posterior lumbar interbody fusion L4-L5 and L5-1 (two levels), posterolateral fusion L2-3, 3-4, 4-5, and 5-1 (four levels), Saber interbody cages at L4-L5 and L5-1 (two levels), segmented pedicle screw fixation with Expedium screws L2 through S1, autograft, same incision, and Infuse BMP.  SURGEON:  Clydene Fake, MD  ASSISTANT:  Hewitt Shorts, MD  ANESTHESIA:  General endotracheal tube anesthesia.  ESTIMATED BLOOD LOSS:  1000 mL, 400 mL Cell Saver returned.  COMPLICATIONS:  None.  DRAINS:  None.  REASON FOR PROCEDURE:  The patient is a 70 year old woman who has been having back and leg pain worse in left with numbness and weakness that has been progressive into the left leg.  Workup showed severe spinal changes at L2 through S1 and decreased disk height.  She had prior surgery, discovering of focal scoliosis concave on the left side at 4-5 and multilevel stenosis.  The patient was brought in for decompressive laminectomy and fusion.  PROCEDURE IN DETAIL:  The patient was brought to the operating room, general anesthesia was induced.  The patient was placed in a prone position on a Wilson frame with all pressure points padded.  The patient was prepped and draped in a sterile fashion.  Site of  incision was injected with 20 mL of 1% lidocaine with epinephrine.  Incision was made at the site of previous incision in the lower lumbar spine and this incision was extended cephalad and incision taken down to the fascia. Hemostasis was obtained with Bovie cauterization.  The fascia was incised, and subperiosteal dissection was done over the L1-S1 spinous process lamina out to the facets and transverse processes of L2, L3, and L4 and L5 and lateral sacrum were all exposed.  Markers were placed at the pedicle entry points for L2, L3, L4, L5 and sacrum, and x-rays obtained and this confirmed our positioning.  We did see the scar from the previous laminectomy at L4 and carefully had a dissector around that and dissected out to the facets, decompressive laminectomy was then done using Leksell rongeurs and high-speed drill, Kerrison punches and decompressed from removing the bottom part of the spinous process laminae of L2 to the top of the sacrum and medial facetectomy was done at L4-5 and 5-1 levels, facetectomy was needed to be done to decompress the nerve roots by not to get around the scar tissue that was there. After careful dissection, we were able to decompress the central canal and the L2, L3, L4, and L5 and S1 roots bilaterally.  Again, extensive decompression was done at the 4-5 and 5-1 levels to get around the scar tissue and to decompress the nerve roots adequately, and there was some significant scar on that left side.  The disk spaces were explored until the 5-1 levels.  Hemostasis was obtained with bipolar cauterization and diskectomy done with pituitary rongeurs.  The interspaces were then distracted, and we were able to distract the 4-5 spaces only up to 7 mm but this helped to decompress the nerve roots in the foramen and then the 5-1 levels were able to be distracted up to 9 mm.  We prepared the interbody space for interbody fusion and interbody cages with the various  supports.  We packed two 9 high Saber interbody cages and then packed the bone in the interspace at 5-1 and then packed and tapped the cages into place at 5-1.  We removed all the bone removed during the laminectomy and was cleaned off soft tissue, chopped in a small pieces and used for fusion here in the later part of the case and when we used the drill, we used trap and collected all drilled shavings to use in autograft bone graft also.  We also used medium Infuse cutting into strips and put with small pieces of Infuse into the interbody cage as we packed them with the autograft bone.  At the 4-5 level and the interspace was prepared and we packed the interspace with autograft bone and then two 7 high cages.  Once irrigated, once the concrete cage was then tapped into place at the 4-5 level.  Then explored the thecal sac and nerve roots at 2-3 and 3-4.  We had good central decompression.  We had good decompression of his nerve roots out laterally and they exited their foramen well.  There were a kind of broad-based disk bulges at both these levels but no frank disk herniation and no clear instabilities at disk space were left alone.  We had a good decompression.  We then used high-speed drill to decorticate transverse processes and lateral facets from L2-S1 bilaterally for posterolateral fusion on each side.  Using fluoroscopy and intraoperative landmarks, I found the entry point for the pedicle screws starting up at L2.  I placed a probe down the pedicle, checked the hole and made sure we had good bony circumference, tapped the hole, and then placed the pedicle screw.  This process was repeated at L2, L3, L4, L5, and S1 bilaterally placing the pedicle screws in all these pedicles.  Expedium screws were used, 6x30 used at L2, L3, and L4, 45-mm screws used at L5 and S1.  We cut rods to a size and to appropriate shape and placed into the topical screw heads and placed locking nuts to hold  the rod in place on each side and we final tightened these nuts and a cross-link was placed at L3- 4 and this was final tightened.  Prior to the cross-link, we did take a final AP and lateral fluoroscopic image showing good position of the pedicle screws from L2-S1 and the interbody cages at L4-5 and 5-1.  We did a posterolateral fusion from L2 to S1 placing Infuse BMP in the posterolateral gutters and then packed with autograft bone bilaterally in the posterolateral gutters.  We again checked the thecal sac and nerve roots and made sure we had good decompression and placing some Gelfoam over the lateral gutters so no bone graft would impinge on the nerve roots.  Retractors removed.  Fascia closed with 0 Vicryl interrupted sutures.  Subcutaneous tissue closed with 2-0 and 3-0 Vicryl interrupted sutures.  Skin closed with benzoin and Steri-Strips. Dressing was placed.  The patient was placed in the supine position, awoken from anesthesia, and transferred to recovery room in stable condition.          ______________________________ Clydene Fake, M.D.     JRH/MEDQ  D:  06/30/2010  T:  07/01/2010  Job:  [id number]  Electronically Signed by Colon Branch M.D. on 07/09/2010 09:14:37 AM

## 2010-07-10 ENCOUNTER — Encounter (HOSPITAL_COMMUNITY): Payer: Self-pay | Admitting: Radiology

## 2010-07-10 ENCOUNTER — Inpatient Hospital Stay (HOSPITAL_COMMUNITY): Payer: Medicare Other

## 2010-07-10 ENCOUNTER — Emergency Department (HOSPITAL_COMMUNITY): Payer: Medicare Other

## 2010-07-10 ENCOUNTER — Inpatient Hospital Stay (HOSPITAL_COMMUNITY)
Admission: EM | Admit: 2010-07-10 | Discharge: 2010-07-15 | DRG: 390 | Disposition: A | Discharge status: Home Health | Payer: Medicare Other | Attending: Internal Medicine | Admitting: Internal Medicine

## 2010-07-10 DIAGNOSIS — E860 Dehydration: Secondary | ICD-10-CM | POA: Diagnosis present

## 2010-07-10 DIAGNOSIS — H269 Unspecified cataract: Secondary | ICD-10-CM | POA: Diagnosis present

## 2010-07-10 DIAGNOSIS — I4891 Unspecified atrial fibrillation: Secondary | ICD-10-CM | POA: Diagnosis present

## 2010-07-10 DIAGNOSIS — I10 Essential (primary) hypertension: Secondary | ICD-10-CM | POA: Diagnosis present

## 2010-07-10 DIAGNOSIS — E785 Hyperlipidemia, unspecified: Secondary | ICD-10-CM | POA: Diagnosis present

## 2010-07-10 DIAGNOSIS — Z981 Arthrodesis status: Secondary | ICD-10-CM

## 2010-07-10 DIAGNOSIS — Z7901 Long term (current) use of anticoagulants: Secondary | ICD-10-CM

## 2010-07-10 DIAGNOSIS — Z79899 Other long term (current) drug therapy: Secondary | ICD-10-CM

## 2010-07-10 DIAGNOSIS — K560 Paralytic ileus: Principal | ICD-10-CM | POA: Diagnosis present

## 2010-07-10 DIAGNOSIS — R7401 Elevation of levels of liver transaminase levels: Secondary | ICD-10-CM | POA: Diagnosis present

## 2010-07-10 DIAGNOSIS — G4733 Obstructive sleep apnea (adult) (pediatric): Secondary | ICD-10-CM | POA: Diagnosis present

## 2010-07-10 DIAGNOSIS — E162 Hypoglycemia, unspecified: Secondary | ICD-10-CM | POA: Diagnosis not present

## 2010-07-10 DIAGNOSIS — R7402 Elevation of levels of lactic acid dehydrogenase (LDH): Secondary | ICD-10-CM | POA: Diagnosis present

## 2010-07-10 DIAGNOSIS — H409 Unspecified glaucoma: Secondary | ICD-10-CM | POA: Diagnosis present

## 2010-07-10 DIAGNOSIS — D509 Iron deficiency anemia, unspecified: Secondary | ICD-10-CM | POA: Diagnosis present

## 2010-07-10 DIAGNOSIS — K219 Gastro-esophageal reflux disease without esophagitis: Secondary | ICD-10-CM | POA: Diagnosis present

## 2010-07-10 HISTORY — DX: Heart failure, unspecified: I50.9

## 2010-07-10 LAB — CBC
HCT: 28.1 % — ABNORMAL LOW (ref 36.0–46.0)
Hemoglobin: 8.9 g/dL — ABNORMAL LOW (ref 12.0–15.0)
MCH: 27.1 pg (ref 26.0–34.0)
MCHC: 31.7 g/dL (ref 30.0–36.0)
MCV: 85.4 fL (ref 78.0–100.0)
Platelets: 448 10*3/uL — ABNORMAL HIGH (ref 150–400)
RBC: 3.29 MIL/uL — ABNORMAL LOW (ref 3.87–5.11)
RDW: 15.8 % — ABNORMAL HIGH (ref 11.5–15.5)
WBC: 9.6 10*3/uL (ref 4.0–10.5)

## 2010-07-10 LAB — PROTIME-INR
INR: 1.14 (ref 0.00–1.49)
Prothrombin Time: 14.8 seconds (ref 11.6–15.2)

## 2010-07-10 LAB — COMPREHENSIVE METABOLIC PANEL
ALT: 63 U/L — ABNORMAL HIGH (ref 0–35)
AST: 41 U/L — ABNORMAL HIGH (ref 0–37)
Albumin: 3 g/dL — ABNORMAL LOW (ref 3.5–5.2)
Alkaline Phosphatase: 99 U/L (ref 39–117)
BUN: 14 mg/dL (ref 6–23)
CO2: 30 mEq/L (ref 19–32)
Calcium: 9.1 mg/dL (ref 8.4–10.5)
Chloride: 96 mEq/L (ref 96–112)
Creatinine, Ser: 1.2 mg/dL (ref 0.4–1.2)
GFR calc Af Amer: 54 mL/min — ABNORMAL LOW (ref >60)
GFR calc non Af Amer: 45 mL/min — ABNORMAL LOW (ref >60)
Glucose, Bld: 117 mg/dL — ABNORMAL HIGH (ref 70–99)
Potassium: 4.6 mEq/L (ref 3.5–5.1)
Sodium: 136 mEq/L (ref 135–145)
Total Bilirubin: 0.5 mg/dL (ref 0.3–1.2)
Total Protein: 6.2 g/dL (ref 6.0–8.3)

## 2010-07-10 LAB — DIFFERENTIAL
Basophils Absolute: 0 10*3/uL (ref 0.0–0.1)
Basophils Relative: 0 % (ref 0–1)
Eosinophils Absolute: 0.1 10*3/uL (ref 0.0–0.7)
Eosinophils Relative: 1 % (ref 0–5)
Lymphocytes Relative: 12 % (ref 12–46)
Lymphs Abs: 1.2 10*3/uL (ref 0.7–4.0)
Monocytes Absolute: 1 10*3/uL (ref 0.1–1.0)
Monocytes Relative: 10 % (ref 3–12)
Neutro Abs: 7.4 10*3/uL (ref 1.7–7.7)
Neutrophils Relative %: 77 % (ref 43–77)

## 2010-07-10 LAB — APTT: aPTT: 30 seconds (ref 24–37)

## 2010-07-10 LAB — PHOSPHORUS: Phosphorus: 3.8 mg/dL (ref 2.3–4.6)

## 2010-07-10 LAB — MAGNESIUM: Magnesium: 2.4 mg/dL (ref 1.5–2.5)

## 2010-07-10 LAB — HEPARIN LEVEL (UNFRACTIONATED): Heparin Unfractionated: <0.1 IU/mL — ABNORMAL LOW (ref 0.30–0.70)

## 2010-07-10 MED ORDER — IOHEXOL 300 MG/ML  SOLN
100.0000 mL | Freq: Once | INTRAMUSCULAR | Status: AC | PRN
  Administered 2010-07-10: 100 mL via INTRAVENOUS

## 2010-07-11 ENCOUNTER — Inpatient Hospital Stay (HOSPITAL_COMMUNITY): Payer: Medicare Other

## 2010-07-11 LAB — HEPATIC FUNCTION PANEL
ALT: 54 U/L — ABNORMAL HIGH (ref 0–35)
AST: 45 U/L — ABNORMAL HIGH (ref 0–37)
Albumin: 2.9 g/dL — ABNORMAL LOW (ref 3.5–5.2)
Alkaline Phosphatase: 93 U/L (ref 39–117)
Bilirubin, Direct: 0.1 mg/dL (ref 0.0–0.3)
Indirect Bilirubin: 0.4 mg/dL (ref 0.3–0.9)
Total Bilirubin: 0.5 mg/dL (ref 0.3–1.2)
Total Protein: 5.7 g/dL — ABNORMAL LOW (ref 6.0–8.3)

## 2010-07-11 LAB — IRON AND TIBC
Iron: 26 ug/dL — ABNORMAL LOW (ref 42–135)
Saturation Ratios: 12 % — ABNORMAL LOW (ref 20–55)
TIBC: 214 ug/dL — ABNORMAL LOW (ref 250–470)
UIBC: 188 ug/dL

## 2010-07-11 LAB — CBC
HCT: 26.4 % — ABNORMAL LOW (ref 36.0–46.0)
Hemoglobin: 8.4 g/dL — ABNORMAL LOW (ref 12.0–15.0)
MCH: 27.2 pg (ref 26.0–34.0)
MCHC: 31.8 g/dL (ref 30.0–36.0)
MCV: 85.4 fL (ref 78.0–100.0)
Platelets: 456 10*3/uL — ABNORMAL HIGH (ref 150–400)
RBC: 3.09 MIL/uL — ABNORMAL LOW (ref 3.87–5.11)
RDW: 16.1 % — ABNORMAL HIGH (ref 11.5–15.5)
WBC: 8.4 10*3/uL (ref 4.0–10.5)

## 2010-07-11 LAB — BASIC METABOLIC PANEL
BUN: 11 mg/dL (ref 6–23)
CO2: 24 mEq/L (ref 19–32)
Calcium: 8.6 mg/dL (ref 8.4–10.5)
Chloride: 105 mEq/L (ref 96–112)
Creatinine, Ser: 1 mg/dL (ref 0.4–1.2)
GFR calc Af Amer: >60 mL/min (ref >60)
GFR calc non Af Amer: 55 mL/min — ABNORMAL LOW (ref >60)
Glucose, Bld: 79 mg/dL (ref 70–99)
Potassium: 3.7 mEq/L (ref 3.5–5.1)
Sodium: 141 mEq/L (ref 135–145)

## 2010-07-11 LAB — HEPARIN LEVEL (UNFRACTIONATED)
Heparin Unfractionated: 0.31 IU/mL (ref 0.30–0.70)
Heparin Unfractionated: 0.16 IU/mL — ABNORMAL LOW (ref 0.30–0.70)
Heparin Unfractionated: 0.46 IU/mL (ref 0.30–0.70)

## 2010-07-11 LAB — FOLATE: Folate: >20 ng/mL

## 2010-07-11 LAB — HEPATITIS PANEL, ACUTE
HCV Ab: NEGATIVE
Hep A IgM: NEGATIVE
Hep B C IgM: NEGATIVE
Hepatitis B Surface Ag: NEGATIVE

## 2010-07-11 LAB — VITAMIN B12: Vitamin B-12: 679 pg/mL (ref 211–911)

## 2010-07-11 LAB — FERRITIN: Ferritin: 217 ng/mL (ref 10–291)

## 2010-07-12 LAB — GLUCOSE, CAPILLARY
Glucose-Capillary: 63 mg/dL — ABNORMAL LOW (ref 70–99)
Glucose-Capillary: 92 mg/dL (ref 70–99)
Glucose-Capillary: 112 mg/dL — ABNORMAL HIGH (ref 70–99)
Glucose-Capillary: 120 mg/dL — ABNORMAL HIGH (ref 70–99)

## 2010-07-12 LAB — CBC
HCT: 25.4 % — ABNORMAL LOW (ref 36.0–46.0)
Hemoglobin: 8.1 g/dL — ABNORMAL LOW (ref 12.0–15.0)
MCH: 27.1 pg (ref 26.0–34.0)
MCHC: 31.9 g/dL (ref 30.0–36.0)
MCV: 84.9 fL (ref 78.0–100.0)
Platelets: 462 10*3/uL — ABNORMAL HIGH (ref 150–400)
RBC: 2.99 MIL/uL — ABNORMAL LOW (ref 3.87–5.11)
RDW: 16 % — ABNORMAL HIGH (ref 11.5–15.5)
WBC: 9 10*3/uL (ref 4.0–10.5)

## 2010-07-12 LAB — BASIC METABOLIC PANEL
BUN: 11 mg/dL (ref 6–23)
CO2: 20 mEq/L (ref 19–32)
Calcium: 8.5 mg/dL (ref 8.4–10.5)
Chloride: 104 mEq/L (ref 96–112)
Creatinine, Ser: 1 mg/dL (ref 0.4–1.2)
GFR calc Af Amer: >60 mL/min (ref >60)
GFR calc non Af Amer: 55 mL/min — ABNORMAL LOW (ref >60)
Glucose, Bld: 55 mg/dL — ABNORMAL LOW (ref 70–99)
Potassium: 3.8 mEq/L (ref 3.5–5.1)
Sodium: 141 mEq/L (ref 135–145)

## 2010-07-12 LAB — HEPARIN LEVEL (UNFRACTIONATED): Heparin Unfractionated: 0.28 IU/mL — ABNORMAL LOW (ref 0.30–0.70)

## 2010-07-13 LAB — CBC
HCT: 24.4 % — ABNORMAL LOW (ref 36.0–46.0)
Hemoglobin: 7.9 g/dL — ABNORMAL LOW (ref 12.0–15.0)
MCH: 27.1 pg (ref 26.0–34.0)
MCHC: 32.4 g/dL (ref 30.0–36.0)
MCV: 83.8 fL (ref 78.0–100.0)
Platelets: 467 10*3/uL — ABNORMAL HIGH (ref 150–400)
RBC: 2.91 MIL/uL — ABNORMAL LOW (ref 3.87–5.11)
RDW: 15.8 % — ABNORMAL HIGH (ref 11.5–15.5)
WBC: 7.3 10*3/uL (ref 4.0–10.5)

## 2010-07-13 LAB — GLUCOSE, CAPILLARY
Glucose-Capillary: 136 mg/dL — ABNORMAL HIGH (ref 70–99)
Glucose-Capillary: 136 mg/dL — ABNORMAL HIGH (ref 70–99)
Glucose-Capillary: 128 mg/dL — ABNORMAL HIGH (ref 70–99)
Glucose-Capillary: 151 mg/dL — ABNORMAL HIGH (ref 70–99)

## 2010-07-13 LAB — COMPREHENSIVE METABOLIC PANEL
ALT: 36 U/L — ABNORMAL HIGH (ref 0–35)
AST: 25 U/L (ref 0–37)
Albumin: 2.8 g/dL — ABNORMAL LOW (ref 3.5–5.2)
Alkaline Phosphatase: 83 U/L (ref 39–117)
BUN: 7 mg/dL (ref 6–23)
CO2: 23 mEq/L (ref 19–32)
Calcium: 8.7 mg/dL (ref 8.4–10.5)
Chloride: 107 mEq/L (ref 96–112)
Creatinine, Ser: 0.86 mg/dL (ref 0.4–1.2)
GFR calc Af Amer: >60 mL/min (ref >60)
GFR calc non Af Amer: >60 mL/min (ref >60)
Glucose, Bld: 128 mg/dL — ABNORMAL HIGH (ref 70–99)
Potassium: 3.5 mEq/L (ref 3.5–5.1)
Sodium: 142 mEq/L (ref 135–145)
Total Bilirubin: 0.5 mg/dL (ref 0.3–1.2)
Total Protein: 5.5 g/dL — ABNORMAL LOW (ref 6.0–8.3)

## 2010-07-13 LAB — HEPARIN LEVEL (UNFRACTIONATED): Heparin Unfractionated: 0.56 IU/mL (ref 0.30–0.70)

## 2010-07-13 LAB — MAGNESIUM: Magnesium: 1.9 mg/dL (ref 1.5–2.5)

## 2010-07-14 LAB — BASIC METABOLIC PANEL
BUN: 4 mg/dL — ABNORMAL LOW (ref 6–23)
CO2: 23 mEq/L (ref 19–32)
Calcium: 8.7 mg/dL (ref 8.4–10.5)
Chloride: 107 mEq/L (ref 96–112)
Creatinine, Ser: 0.92 mg/dL (ref 0.4–1.2)
GFR calc Af Amer: >60 mL/min (ref >60)
GFR calc non Af Amer: >60 mL/min (ref >60)
Glucose, Bld: 85 mg/dL (ref 70–99)
Potassium: 3.6 mEq/L (ref 3.5–5.1)
Sodium: 140 mEq/L (ref 135–145)

## 2010-07-14 LAB — PROTIME-INR
INR: 1.14 (ref 0.00–1.49)
Prothrombin Time: 14.8 seconds (ref 11.6–15.2)

## 2010-07-14 LAB — GLUCOSE, CAPILLARY: Glucose-Capillary: 142 mg/dL — ABNORMAL HIGH (ref 70–99)

## 2010-07-14 LAB — CBC
HCT: 27.2 % — ABNORMAL LOW (ref 36.0–46.0)
Hemoglobin: 8.6 g/dL — ABNORMAL LOW (ref 12.0–15.0)
MCH: 27 pg (ref 26.0–34.0)
MCHC: 31.6 g/dL (ref 30.0–36.0)
MCV: 85.3 fL (ref 78.0–100.0)
Platelets: 453 10*3/uL — ABNORMAL HIGH (ref 150–400)
RBC: 3.19 MIL/uL — ABNORMAL LOW (ref 3.87–5.11)
RDW: 15.9 % — ABNORMAL HIGH (ref 11.5–15.5)
WBC: 7.3 10*3/uL (ref 4.0–10.5)

## 2010-07-15 LAB — CBC
HCT: 26 % — ABNORMAL LOW (ref 36.0–46.0)
Hemoglobin: 8.3 g/dL — ABNORMAL LOW (ref 12.0–15.0)
MCH: 27.2 pg (ref 26.0–34.0)
MCHC: 31.9 g/dL (ref 30.0–36.0)
MCV: 85.2 fL (ref 78.0–100.0)
Platelets: 419 10*3/uL — ABNORMAL HIGH (ref 150–400)
RBC: 3.05 MIL/uL — ABNORMAL LOW (ref 3.87–5.11)
RDW: 16.1 % — ABNORMAL HIGH (ref 11.5–15.5)
WBC: 6.6 10*3/uL (ref 4.0–10.5)

## 2010-07-15 LAB — BASIC METABOLIC PANEL
BUN: 7 mg/dL (ref 6–23)
CO2: 23 mEq/L (ref 19–32)
Calcium: 8.9 mg/dL (ref 8.4–10.5)
Chloride: 108 mEq/L (ref 96–112)
Creatinine, Ser: 0.92 mg/dL (ref 0.4–1.2)
GFR calc Af Amer: >60 mL/min (ref >60)
GFR calc non Af Amer: >60 mL/min (ref >60)
Glucose, Bld: 93 mg/dL (ref 70–99)
Potassium: 3.5 mEq/L (ref 3.5–5.1)
Sodium: 144 mEq/L (ref 135–145)

## 2010-07-15 LAB — PROTIME-INR
INR: 1.33 (ref 0.00–1.49)
Prothrombin Time: 16.7 seconds — ABNORMAL HIGH (ref 11.6–15.2)

## 2010-07-17 ENCOUNTER — Encounter (INDEPENDENT_AMBULATORY_CARE_PROVIDER_SITE_OTHER): Payer: Medicare Other

## 2010-07-17 ENCOUNTER — Encounter: Payer: Self-pay | Admitting: Cardiology

## 2010-07-17 DIAGNOSIS — I4891 Unspecified atrial fibrillation: Secondary | ICD-10-CM

## 2010-07-17 DIAGNOSIS — Z7901 Long term (current) use of anticoagulants: Secondary | ICD-10-CM

## 2010-07-17 LAB — CONVERTED CEMR LAB: POC INR: 1.7

## 2010-07-19 NOTE — H&P (Signed)
[name], [name]           ACCOUNT NO.:  [id number]  MEDICAL RECORD NO.:  [id number]           PATIENT TYPE:  E  LOCATION:  [facility]                         FACILITY:  [facility]  PHYSICIAN:  Hartley Barefoot, MD    DATE OF BIRTH:  Mar 16, 1941  DATE OF ADMISSION:  07/10/2010 DATE OF DISCHARGE:                             HISTORY & PHYSICAL   CHIEF COMPLAINT:  Nausea, vomiting, abdominal pain.  SURGEON:  Payton Doughty, MD  PRIMARY CARDIOLOGIST:  [name]. [name], MD, [facility]  PRIMARY CARE PHYSICIAN:  Bryan Lemma. Ehinger, MD  HISTORY OF PRESENT ILLNESS:  This is a very pleasant 70 year old with past medical history of AFib, hypertension who was recently discharged from the hospital, February 1 after an admission for lumbar stenosis, spondylosis, and scoliosis status post laminectomy and decompression of the L2, L3, L4, L5, and S1 root, posterior lumbar interbody fusion L4-L5 and L5 and S1 posterolateral fusion who presented to the emergency department complaining of nausea, vomiting, abdominal bloating, and pain.  She related she went home on February 1 and she was in improved condition.  She was tolerating diet but then the night prior to this admission, she started to have again abdominal bloating and discomfort. She vomited twice brownish fluid content the night prior to admission. She related that she was feeling miserable and with worsening abdominal discomfort and belching and that was why she presented to the emergency department.  Of note, her last admission was complicated by what appears to be ileus.  She relates that she was having after surgery some abdominal distention.  She even had an NG tube and she was also vomiting.  Her condition improved and she was tolerating a diet when she went home.  She is taking oxycodone for pain control.  She related that she had a bowel movement the night prior to admission.  She denies any fevers, chills, cough, shortness of breath, chest  pain.  ALLERGIES:  No known drug allergies.  SOCIAL HISTORY:  She is retired.  She lives at [employer] with husband.  She has 2 children.  She denies smoking, alcohol, or recreational drugs.  PAST MEDICAL HISTORY: 1. AFib. 2. Hypertension. 3. Obstructive sleep apnea. 4. Hypercholesterolemia.  PAST SURGICAL HISTORY: 1. Hysterectomy. 2. Status post laminectomy, decompression of L2, L3, L4, L5, and S1     root, posterior lumbar interbody fusion L4-L5 and L5-S1,     posterolateral fusion L2-L3, 3 and 4, 4 and 5, and 5 and 1,  SABER     interbody cage at L4-L5 and L5-S1, segmented pedicle screw fixation     with Expedium screw L2-S5, autograft, and Infuse BMP.  MEDICATIONS: 1. Warfarin 5 mg Mondays and Fridays, 2.5 other day. 2. Metoprolol 100 mg. 3. Triamterene/hydrochlorothiazide 37.5/25 p.o. daily. 4. Fish oil. 5. Centrum. 6. Vitamin C. 7. Glucosamine. 8. Calcium 9. Flaxseed. 10.Diltiazem 240 mg nightly. 11.Omeprazole 20 mg at night. 12.She has been also taking senna. 13.Methocarbamol. 14.Oxycodone.  FAMILY HISTORY:  Noncontributory.  PHYSICAL EXAMINATION:  VITALS:  Temperature 99.1, respirations 16, pulse 95, blood pressure 145/80, sat 98 on room air. GENERAL:  The patient sitting in chair  in no acute distress. HEENT:  Head atraumatic, normocephalic.  Eyes anicteric.  Pupils are equal, reactive to light. NECK:  Supple.  No JVD.  No thyromegaly. CARDIOVASCULAR:  S1, S2, regular rhythm and rate.  No murmur, rubs, or gallops. LUNGS:  Bilateral air movement, clear to auscultation. ABDOMEN:  Bowel sounds decreased.  Mild tenderness, bilateral lower quadrant.  No guarding, rigidity, or rebound tenderness. EXTREMITIES:  Pulse positive.  No edema. NEURO:  Nonfocal.  ADMISSION LABS:  PTT 30, PT 14, INR 1.14.  Sodium 136, potassium 4.6, chloride 96, bicarb 30, glucose 117, BUN 14, creatinine 1.2, AST 41, AST 63.  White blood cell 9.6, hemoglobin 8.9, platelet  448,000.  X-ray, CT abdomen and pelvis study positive for a small bowel obstruction likely due to adhesions.  Status post L2-S1 fusion, distention of urinary bladder.  Abdominal x-ray, mildly dilated loop of small bowel with air-fluid levels, most compatible with ileus in the postoperative setting.  Small bowel distention appears minimal improved compared to prior exam July 04, 2010.  ASSESSMENT AND PLAN:  This is a 70 year old that presented with abdominal pain, nausea, vomiting. 1. Ileus versus small bowel obstruction.  Surgery will help with     management.  They recommend at this time conservative treatment, NG     tube, n.p.o.  We will avoid opioids.  IV fluid, correction of     electrolytes.  We will repeat KUB in the morning. 2. Atrial fibrillation, rate controlled.  At this time I will start IV     Lopressor q.8 h. IV.  I will order heparin per pharmacy for stroke     prevention, hold Coumadin in case the patient requires surgery. 3. Hypertension.  Hydralazine IV p.r.n.  Continue with Lopressor.  I     will hold hydrochlorothiazide and triamterene to avoid volume     depletion. 4. Anemia.  I will check anemia panel.  Hemoglobin on January 28 was     9.3.  This is a possible post surgery. 5. Mild increase in liver function test.  I will check viral hepatitis     panel.  Repeat liver function test in a.m. 6. Mild Fever, could be from small bowel obstruction.  I will     check UA and chest x-ray.  No leukocytosis. 7. For deep venous thrombosis prophylaxis, the patient will be on     heparin.     Hartley Barefoot, MD     BR/MEDQ  D:  07/10/2010  T:  07/10/2010  Job:  [id number]  Electronically Signed by Hartley Barefoot MD on 07/19/2010 10:34:48 PM

## 2010-07-19 NOTE — Discharge Summary (Signed)
  [name], [name]           ACCOUNT NO.:  [id number]  MEDICAL RECORD NO.:  [id number]           PATIENT TYPE:  I  LOCATION:  4742                         FACILITY:  [facility]  PHYSICIAN:  Rock Nephew, MD            DATE OF BIRTH:  DATE OF ADMISSION:  07/10/2010 DATE OF DISCHARGE:                        DISCHARGE SUMMARY - REFERRING   ADDENDUM:  NOTE:  This is an addendum to the discharge dictation summary dictated by Dr. Sunnie Nielsen on July 14, 2010.  DISCHARGE DIAGNOSIS:  Small-bowel obstruction versus ileus,  Afb on Coumadin, anemia, iron deficiency, history of hypertension, history of obstructive sleep apnea, history of hypercholesterolemia, history of transaminitis resolved.  DISCHARGE MEDICATIONS: 1. Colace 100 mg by mouth twice daily. 2. Ferrous sulfate 325 mg by mouth twice daily. 3. Senna 2 tablets by mouth daily at bedtime. 4. Calcium, magnesium, and zinc over-the-counter 1 tablet by mouth     daily. 5. Coumadin 5 mg 1 tablet on Mondays and 1-1/2 tablet the rest of the     week. 6. Diltiazem CD XT 240 mg by mouth daily. 7. Fish oil 1200 mg by mouth daily. 8. Flaxseed well 1200 mg 1 capsule by mouth daily. 9. Glucosamine and chondroitin 1 tablet by mouth daily. 10.Lumigan 0.03% 1 drop in both eyes daily at bedtime. 11.Metoprolol XL 100 mg 1 tablet by mouth daily. 12.Multivitamins 1 tablet by mouth daily. 13.Omeprazole 20 mg p.o. daily. 14.Pravastatin 40 mg p.o. daily at bedtime. 15.Triamterene/hydrochlorothiazide 37.5/25 mg 1 tablet by mouth daily. 16.Vitamin C 500 mg 1 tablet by mouth daily.  The patient's diet should be heart-healthy and high fiber.  Otherwise, please see review of the discharge summary dictated by Dr. Sunnie Nielsen for further details.     Rock Nephew, MD     NH/MEDQ  D:  07/15/2010  T:  07/15/2010  Job:  [id number]  cc:   Bryan Lemma. Manus Gunning, M.D. [name]. [name], MD, [facility] Clydene Fake, M.D.  Electronically Signed by Rock Nephew MD  on 07/16/2010 06:14:23 PM

## 2010-07-19 NOTE — Discharge Summary (Signed)
[name], [name]           ACCOUNT NO.:  [id number]  MEDICAL RECORD NO.:  [id number]           PATIENT TYPE:  I  LOCATION:  4742                         FACILITY:  [facility]  PHYSICIAN:  Hartley Barefoot, MD    DATE OF BIRTH:  05-04-1941  DATE OF ADMISSION:  07/10/2010 DATE OF DISCHARGE:  07/15/2010                              DISCHARGE SUMMARY   DISCHARGE DIAGNOSES: 1. Small bowel obstruction versus ileus. 2. history of atrial fibrillation . 3. Anemia, iron deficiency. 4. Hypertension. 5. Obstructive sleep apnea. 6. Hypercholesterolemia. 7. Transaminases, which resolved.  PAST SURGICAL HISTORY: 1. Hysterectomy. 2. Status post laminectomy, decompression of L2, L3, L4, L5 and S1.  MEDICATION AT DISCHARGE:  To be determined.  PROCEDURE PERFORMED: 1. X-ray on February 4th showed bowel gas pattern in favor of     patient's partially small bowel obstruction. 2. CT abdomen and pelvis February 3 showed positive for a small bowel     obstruction likely due to adhesions. 3. Status post L2-S1 fusion.  Distention of urinary bladder in order.  HISTORY OF PRESENT ILLNESS:  This is a very pleasant 70 year old with past medical history of atrial fibrillation, hypertension who recently was discharged from the hospital on February 1 after lumbar stenosis, spondylosis, laminectomy and decompression.  The patient presents complaining of abdominal pain, blurring, nausea, vomiting that started a night prior to admission.  Of note, the patient relates that during her last hospitalization, she developed abdominal distention.  She had an NG tube in place, but prior to going home, she was tolerating diet and she was feeling okay.  HOSPITAL COURSE: 1. Partial small bowel obstruction versus ileus.  Surgery was     consulted.  The patient was treated conservative with NG tube,     bowel rest.  We discontinued opioid.  During hospitalization, the     patient improved.  The NG tube was  removed.  She had almost 1 liter     of fluid removed from the NG tube.  Abdominal pain has resolved and     distention has improved.  She has been tolerating diet.  She is     feeling better, relate decreased distention.  She had multiple     bowel movement. 2. Atrial fibrillation.  The patient was initially started on heparin     per pharmacy in case the patient requires surgery.  Coumadin was     initially on hold.  When the patient was tolerating diet, heparin     was discontinued and she was restarted on Coumadin.  She will need     to follow up with her Coumadin Clinic for further adjustment. 3. Anemia, iron deficiency.  Anemia panel was ordered.  Folic acid     more than 20, B12 679, total iron 26, ferritin 217.  I will give     her a trial of iron.  She will need to follow with her primary care     physician to check another hemoglobin level.  DISPOSITION:  The plan is for the patient to be discharged on February 8 after making sure that she is tolerating regular diet.  She will need to follow with her primary care physician.  She was instructed to avoid opioid and to take Tylenol for pain as needed.  She will need a bowel regimen.  On the day prior to discharge, temperature was 98.2, pulse 96, respiration 19, blood pressure 110/71, sat 97 on room air.      Hartley Barefoot, MD     BR/MEDQ  D:  07/14/2010  T:  07/14/2010  Job:  [id number]  Electronically Signed by Hartley Barefoot MD on 07/19/2010 10:37:04 PM

## 2010-07-23 NOTE — Medication Information (Signed)
Summary: Coumadin Clinic  Anticoagulant Therapy  Managed by: Bethena Midget, RN, BSN Referring MD: Charlton Haws MD PCP: Ihor Austin MD: Daleen Squibb MD, Maisie Fus Indication 1: Atrial Fibrillation (ICD-427.31) Lab Used: [facility] Site: [facility] INR POC 1.7 INR RANGE 2 - 3  Dietary changes: no    Health status changes: no    Bleeding/hemorrhagic complications: no    Recent/future hospitalizations: yes       Details: Discharged from [facility] on 07/15/10 after 2 admissions, 1st adm 06/30/10 for lower back Sx and D/C on 07/08/10, reamitted on 07/10/10 for ileus.   Any changes in medication regimen? yes       Details: Colace, Senokot, and ferrous Sulfate  Recent/future dental: no  Any missed doses?: yes     Details: Restarted on 07/13/10 PO coumadin   Is patient compliant with meds? yes       Allergies: No Known Drug Allergies  Anticoagulation Management History:      The patient is taking warfarin and comes in today for a routine follow up visit.  Positive risk factors for bleeding include an age of 68 years or older.  The bleeding index is 'intermediate risk'.  Negative CHADS2 values include Age > 20 years old.  The start date was 12/21/2006.  Anticoagulation responsible provider: Daleen Squibb MD, Maisie Fus.  INR POC: 1.7.  Cuvette Lot#: [id number].  Exp: 06/2011.    Anticoagulation Management Assessment/Plan:      The patient's current anticoagulation dose is Warfarin sodium 5 mg tabs: Take as directed.  The target INR is 2.0-3.0.  The next INR is due 07/31/2010.  Anticoagulation instructions were given to patient.  Results were reviewed/authorized by Bethena Midget, RN, BSN.  She was notified by Bethena Midget, RN, BSN.         Prior Anticoagulation Instructions: INR 2.6   Coumadin 5 mg tablets - Continue 1/2 tablet every day except 1 tablet on Mondays.   Return to clinic in 4 weeks on 07/09/10   Current Anticoagulation Instructions: INR 1.7 Continue 1/2 pill everyday except 1 pill on Mondays. Recheck  in 2 weeks.

## 2010-07-30 DIAGNOSIS — I4891 Unspecified atrial fibrillation: Secondary | ICD-10-CM

## 2010-07-30 NOTE — Consult Note (Signed)
NAMEMAYTTE, JACOT           ACCOUNT NO.:  [id number]  MEDICAL RECORD NO.:  [id number]           PATIENT TYPE:  I  LOCATION:  4742                         FACILITY:  [facility]  PHYSICIAN:  Velora Heckler, MD      DATE OF BIRTH:  06/15/1940  DATE OF CONSULTATION:  07/10/2010 DATE OF DISCHARGE:                                CONSULTATION   REQUESTING PHYSICIAN:  Jerelyn Scott, MD, at the emergency department.  PRIMARY CARE DOCTOR:  Bryan Lemma. Manus Gunning, MD  PRIMARY CARDIOLOGIST:  [facility].  PRIMARY NEUROSURGEON:  Clydene Fake, MD  REASON FOR CONSULTATION:  Partial bowel obstruction versus ileus.  HISTORY OF PRESENT ILLNESS:  [name] is a pleasant 70 year old female who recently underwent L2 through S1 decompressive laminectomy on July 01, 2010.  She states she sounds like she did develop an ileus during her hospitalization requiring an NG tube for approximately 1 day due to some abdominal distention and nausea, vomiting.  She then states she started passing a small amount of liquid stool and a little bit of gas.  Therefore, her nasogastric tube was removed.  She was started on a little bit of a liquid diet prior to discharge home.  She got home about 48 hours ago and did sleep well, but however, the following day, that being yesterday, the patient started feeling like she did when she was in the hospital with abdominal distention and significant nausea.  She has not had any further bowel movements and reports only a scant amount of flatus.  She reports her abdomen is more uncomfortable due to the distention, particularly because she is wearing the brace for her back. She denies any fever, denies any chills.  She denies any chest pain or shortness of breath.  She does state that her urine output has been a little less in the last couple days but has not had any dysuria or hematuria.  She does have a history of atrial fibrillation and restarted her Coumadin  upon arrival home approximately 2 days ago.  However, because of her recurrent symptoms of abdominal distention and nausea, she was brought to the emergency department.  Her evaluation consisted of labs and x-rays which showed evidence of at least ileus or partial bowel obstruction by CT scan and plain films.  This has prompted surgical consultation.  The patient's primary neurosurgical team has apparently been made aware of the patient's return to the hospital.  PAST MEDICAL HISTORY:  Significant for hypertension, hyperlipidemia, atrial fibrillation, gastroesophageal reflux disease, glaucoma, cataracts.  PAST SURGICAL HISTORY:  Besides the aforementioned back surgery, the patient has had a hysterectomy in 1994.  FAMILY HISTORY:  Noncontributory at present case.  SOCIAL HISTORY:  The patient is employed.  She does live with her husband.  She denies any alcohol or tobacco.  MEDICATIONS:  Tentative list includes Percocet, methocarbamol, Coumadin, diltiazem, hydrochlorothiazide, Toprol, triamterene, omeprazole, pravastatin, Lumigan, and multivitamins.  ALLERGIES:  No known drug or latex allergies.  REVIEW OF SYSTEMS:  Please see history of present illness for pertinent findings, otherwise complete 10-system review found negative.  PHYSICAL EXAMINATION:  GENERAL:  A 70 year old female who  appears to be in mild distress but nontoxic appearing. CURRENT VITAL SIGNS:  Temperature of 98.5, heart rate of 95, blood pressure 145/80, respiratory rate of 16, oxygen saturation 98% on room air. ENT:  Unremarkable. NECK:  Supple without lymphadenopathy.  Trachea is midline.  No thyromegaly or masses. LUNGS:  Clear.  No wheezes, rhonchi, or rales. HEART:  Regular rate and rhythm.  No murmurs, gallops, or rubs. ABDOMEN:  The patient's abdomen is distended and protuberant though soft.  She has hypoactive bowel sounds.  She has generalized tenderness without evidence of peritonitis. RECTAL:   Deferred. NEUROLOGIC:  The patient is alert and oriented x3.  DIAGNOSTICS:  CBC shows a white blood cell count of 9.6, hemoglobin of 8.9, hematocrit of 28.1, platelet count of 448,000.  Metabolic panel shows a sodium of 136, potassium of 4.6, chloride of 96, CO2 of 30, BUN of 14, creatinine of 1.2, glucose of 117.  INR currently 1.14.  It apparently was 1.0 the day of her surgery and has not been checked in the interim.  IMAGING:  CT scan of the abdomen and pelvis shows dilated loops of small bowel with an apparent transition point in the mid left abdomen.  Plain films show evidence consistent with that, but more of an ileus-type pattern as also appears to be some gas in her rectum on plain films.  IMPRESSION: 1. This is more likely an ileus/partial bowel obstruction from her     recent back surgery with contributing factors of immobility and     narcotic use for her pain. 2. Dehydration. 3. Atrial fibrillation. 4. Hypertension.  PLAN:  The patient does need admission either by a medical team or her recent neurosurgery team for ongoing management.  We will be happy to consult and follow along this patient for comanagement of this ileus. Recommend bowel rest and an NG tube to low intermittent wall suction, IV fluid resuscitation.  At this point, we would also recommend heparin drip instead of Coumadin use for now, and the patient may require surgical intervention.  However, hopefully the patient will resolve this with conservative management by means of increased mobility, bowel rest with the NG tube and decreased narcotic use.  We would anticipate that her bowel function would return without the need for surgical intervention at this point; however, I discussed with the patient that surgical intervention is a possibility if she does not improve with conservative management.     Brayton El, PA-C   ______________________________ Velora Heckler, MD    KB/MEDQ  D:   07/10/2010  T:  07/10/2010  Job:  [id number]  cc:   Jerelyn Scott, MD  Electronically Signed by Brayton El  on 07/13/2010 01:34:47 PM Electronically Signed by Darnell Level MD on 07/30/2010 10:34:10 AM

## 2010-07-31 ENCOUNTER — Encounter (INDEPENDENT_AMBULATORY_CARE_PROVIDER_SITE_OTHER): Payer: Medicare Other

## 2010-07-31 ENCOUNTER — Encounter: Payer: Self-pay | Admitting: Internal Medicine

## 2010-07-31 DIAGNOSIS — I4891 Unspecified atrial fibrillation: Secondary | ICD-10-CM

## 2010-07-31 DIAGNOSIS — Z7901 Long term (current) use of anticoagulants: Secondary | ICD-10-CM

## 2010-07-31 LAB — CONVERTED CEMR LAB: POC INR: 2.5

## 2010-08-04 NOTE — Medication Information (Signed)
Summary: rov/ewj  Anticoagulant Therapy  Managed by: Weston Brass, PharmD Referring MD: Charlton Haws MD PCP: Ihor Austin MD: Tenny Craw MD, Gunnar Fusi Indication 1: Atrial Fibrillation (ICD-427.31) Lab Used: [facility] Site: [facility] INR POC 2.5 INR RANGE 2 - 3  Dietary changes: no    Health status changes: no    Bleeding/hemorrhagic complications: no    Recent/future hospitalizations: no    Any changes in medication regimen? yes       Details: Gabapentin 300mg  1 capsule qam and 1qhs (started about a week ago wednesday)  Recent/future dental: no  Any missed doses?: no       Is patient compliant with meds? yes       Allergies: No Known Drug Allergies  Anticoagulation Management History:      The patient is taking warfarin and comes in today for a routine follow up visit.  Positive risk factors for bleeding include an age of 27 years or older.  The bleeding index is 'intermediate risk'.  Negative CHADS2 values include Age > 59 years old.  The start date was 12/21/2006.  Anticoagulation responsible provider: Tenny Craw MD, Gunnar Fusi.  INR POC: 2.5.  Cuvette Lot#: [id number].  Exp: 07/2011.    Anticoagulation Management Assessment/Plan:      The patient's current anticoagulation dose is Warfarin sodium 5 mg tabs: Take as directed.  The target INR is 2.0-3.0.  The next INR is due 08/28/2010.  Anticoagulation instructions were given to patient.  Results were reviewed/authorized by Weston Brass, PharmD.  She was notified by Margot Chimes PharmD Candidate.         Prior Anticoagulation Instructions: INR 1.7 Continue 1/2 pill everyday except 1 pill on Mondays. Recheck in 2 weeks.   Current Anticoagulation Instructions: INR 2.5  Continue to take 1/2 tablet daily except for Mondays when you take 1 tablet.  Recheck INR in 4 weeks.

## 2010-08-05 ENCOUNTER — Encounter: Payer: Self-pay | Admitting: Cardiovascular Disease

## 2010-08-05 ENCOUNTER — Ambulatory Visit (INDEPENDENT_AMBULATORY_CARE_PROVIDER_SITE_OTHER): Payer: Medicare Other | Admitting: Cardiovascular Disease

## 2010-08-05 DIAGNOSIS — I4891 Unspecified atrial fibrillation: Secondary | ICD-10-CM

## 2010-08-05 DIAGNOSIS — E785 Hyperlipidemia, unspecified: Secondary | ICD-10-CM

## 2010-08-05 DIAGNOSIS — Z7901 Long term (current) use of anticoagulants: Secondary | ICD-10-CM

## 2010-08-06 ENCOUNTER — Telehealth: Payer: Self-pay | Admitting: Cardiovascular Disease

## 2010-08-13 NOTE — Assessment & Plan Note (Signed)
Summary: ROV/SL/JT   Primary Provider:  Manus Gunning  CC:  follow up since hospital.  History of Present Illness: [name] is a 70 yo female with a history of paroxysmal atrial fibrillation on chronic Coumadin therapy.  She has a history of chest discomfort.  She had heart catheterization in Feb. 2002 that demonstrated normal coronary arteries and normal LV function.  A Myoview study done in July 2009 demonstrated no ischemia.  Her last echocardiogram done in July 2008 demonstrated an ejection fraction of 60%, mild LVH, mild LAE.  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  She's had multiple back surgeries.  She was hospitalized 3 weeks ago for SBO Finiishing PT at home but still has to wear back brace for 6 weeks  Current Problems (verified): 1)  Edema  (ICD-782.3) 2)  Pre-operative Cardiovascular Examination  (ICD-V72.81) 3)  Coumadin Therapy  (ICD-V58.61) 4)  Atrial Fibrillation  (ICD-427.31) 5)  Hyperlipidemia  (ICD-272.4) 6)  ? 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 Take 1 Tablet By Mouth Once A Day 2)  Diltiazem Hcl Cr 240 Mg Xr24h-Cap (Diltiazem Hcl) .... Take 1 Capsule By Mouth Once A Day 3)  Pravachol 40 Mg Tabs (Pravastatin Sodium) .Marland Kitchen.. 1 Tab By Mouth Once Daily 4)  Allergy Vaccine  1:10 Go (W-E) .... Once Weekly 5)  Flax Seed Oil 1200 Mg Caps (Flaxseed (Linseed)) .... Take 1 By Mouth Once Daily 6)  Fish Oil 1200 Mg Caps (Omega-3 Fatty Acids) .... Take 1 By Mouth Once Daily 7)  Vitamin C 500 Mg  Tabs (Ascorbic Acid) .... Take 1 Tablet By Mouth Once Daily 8)  Calcium-Magnesium-Zinc 333-133-5 Mg Tabs  (Calcium-Magnesium-Zinc) .... Take 1 Tablet By Mouth Once Daily 9)  Warfarin Sodium 5 Mg Tabs (Warfarin Sodium) .... Take As Directed 10)  Triamterene-Hctz 37.5-25 Mg Tabs (Triamterene-Hctz) .... Take 1 Tablet Daily 11)  Omeprazole 20 Mg Cpdr (Omeprazole) .Marland Kitchen.. 1 Tab By Mouth Once Daily 12)  Metoprolol Succinate 100 Mg Xr24h-Tab (Metoprolol Succinate) .... One Daily 13)  Pramipexole Dihydrochloride 0.5 Mg Tabs (Pramipexole Dihydrochloride) .... Take 1/2 To 1 Tablet By Mouth At Bedtime 14)  Allegra 180 Mg Tabs (Fexofenadine Hcl) .Marland Kitchen.. 1 Tablet By Mouth Daily As Needed For Allergies 15)  Lumigan 0.01 % Soln (Bimatoprost) .... As Idrected 16)  Glucosamine-Chondroitin 250-200 Mg Caps (Glucosamine-Chondroitin) .... One Tablet Once Daily 17)  Colace 100 Mg Caps (Docusate Sodium) .... As Needed 18)  Ferrous Sulfate 325 (65 Fe) Mg  Tabs (Ferrous Sulfate) .Marland Kitchen.. 1 Tab By Mouth Two Times A Day 19)  Senokot 8.6 Mg Tabs (Sennosides) .... 2 Tabs By Mouth At Bedtime 20)  Gabapentin 300 Mg Caps (Gabapentin) .Marland Kitchen.. 1 Tab By Mouth Two Times A Day  Allergies (verified): No Known Drug Allergies  Past History:  Past Medical History: Last updated: 04/23/2008 PAF/ coumadin Hypertension DJD Allergic rhinitis       Vaccine Hyperlipidemia Sleep Apnea  NPSG 01/25/08 AHI 6.3        CPAP auto- titrated to 10  Past Surgical History: Last updated: 03/30/2010 Septoplasty Total Abdominal Hysterectomy Lipoma Lumbar disk surgery Repair spinal fluid leak Cataract surgery   Family  History: Last updated: 03/04/2008 mother-deceased natural causes, arthritis father-deceased age 31-MI sister-HBP, asthma, allergies  Social History: Last updated: 03/04/2008 Patient never smoked.  Pt is married, and has 2 children. Pt is currently employeed in clerical office work. Husband immunosuppressed after heart transplant in 2000  Review of Systems       Denies fever, malais, weight loss, blurry vision, decreased visual  acuity, cough, sputum, SOB, hemoptysis, pleuritic pain, palpitaitons, heartburn, abdominal pain, melena, lower extremity edema, claudication, or rash.   Vital Signs:  Patient profile:   70 year old female Height:      66 inches Weight:      198 pounds BMI:     32.07 Pulse rate:   68 / minute Resp:     16 per minute BP sitting:   122 / 70  (left arm)  Vitals Entered By: Kem Parkinson (August 05, 2010 10:02 AM)  Physical Exam  General:  Affect appropriate Healthy:  appears stated age HEENT: normal Neck supple with no adenopathy JVP normal no bruits no thyromegaly Lungs clear with no wheezing and good diaphragmatic motion Heart:  S1/S2 no murmur,rub, gallop or click PMI normal Abdomen: benighn, BS positve, no tenderness, no AAA no bruit.  No HSM or HJR Distal pulses intact with no bruits No edema Neuro non-focal Skin warm and dry    Impression & Recommendations:  Problem # 1:  COUMADIN THERAPY (ICD-V58.61) Rx F/U clinic 3/21.  No problems off coumadin while in hospital for back surgery and SBO  Problem # 2:  ATRIAL FIBRILLATION (ICD-427.31) Good rate control and anticoagulation  Asymptomatic Her updated medication list for this problem includes:    Warfarin Sodium 5 Mg Tabs (Warfarin sodium) .Marland Kitchen... Take as directed    Metoprolol Succinate 100 Mg Xr24h-tab (Metoprolol succinate) ..... One daily  Problem # 3:  HYPERLIPIDEMIA (ICD-272.4) Labs in 6 months Her updated medication list for this problem includes:    Pravachol 40 Mg Tabs (Pravastatin sodium) .Marland Kitchen... 1 tab by mouth once daily  Patient Instructions: 1)  Your physician recommends that you schedule a follow-up appointment in: 6 months 2)  Your physician recommends that you continue on your current medications as directed. Please refer to the Current Medication list given to you today.

## 2010-08-13 NOTE — Progress Notes (Signed)
Summary: pt calling re taking c q 10  Phone Note Call from Patient   Caller: Patient [phone] Summary of Call: pt in yesterday-forgot to ask about taking cq 10  Initial call taken by: Glynda Jaeger,  August 06, 2010 2:45 PM  Follow-up for Phone Call        okay given for pt to take co q 10. Deliah Goody, RN  August 06, 2010 5:33 PM

## 2010-08-14 NOTE — Discharge Summary (Signed)
[name], [name]           ACCOUNT NO.:  [id number]  MEDICAL RECORD NO.:  [id number]           PATIENT TYPE:  I  LOCATION:  3014                         FACILITY:  [facility]  PHYSICIAN:  Clydene Fake, M.D.  DATE OF BIRTH:  12/31/1940  DATE OF ADMISSION:  06/30/2010 DATE OF DISCHARGE:  07/08/2010                              DISCHARGE SUMMARY   DIAGNOSES:  Lumbar stenosis, spondylosis, scoliosis with radiculopathy L2-S1 with prior surgery.  DISCHARGE DIAGNOSES:  Lumbar stenosis, spondylosis, scoliosis with radiculopathy L2-S1 with prior surgery and ileus.  PROCEDURE:  Redo decompressive laminectomy, decompressing the L2, L3, L4, L5, and S1 roots at 5 levels, posterior lumbar interbody fusion at L4-L5 and L5-S1, posterolateral fusion at L2-L3, L3-L4, L4-L5, L5-S1, Saber interbody cages at L4-L5 and L5-S1, segmented pedicle screw fixation with Expedium screws L2 through S1, autograft from same incision, and Infuse BMP.  REASON FOR ADMISSION:  The patient is a 70 year old woman who has been having back and leg pain, worse in the left with numbness and weakness that has been progressive.  Workup showed severe spondylotic changes at L2 through S1, scoliosis, and significant multilevel stenosis.  The patient was brought in for decompression and fusion.  HOSPITAL COURSE:  The patient was admitted on the day of surgery and underwent procedure above without complications and postop the patient was transferred to the recovery room and then to the intensive care unit for observation.  On 25, she was doing well, still with some slight weakness in left EHL on dorsiflexion, but no change may be a little better than preop status.  Decreased leg pain but really had not been up yet.  PT/OT was consulted, started working with the patient, and she was started to make a good progress by the 26, and was up ambulating and was transferred to the floor.  She continued working with the  therapies. She had positive flatus on 26 and 27 with no bowel movement.  She was awake and with some slow progress and we talking about setting up some home health.  The patient was discharged for home health with the nurse PT/OT.  She started developing with some further nausea on 27 and 28, and little bit distended in her abdomen that showing ileus symptoms and NG was placed.  Because of continuing problems, but she was continued ambulating and after there showed NG was able to be removed and start advancing her diet.  She continued making some progress and did have a bowel movement on 29, but it was good to distend in the abdomen, it was making no progress.  By 07/08/2010, she had multiple formed bowel movements, was actually eating, much less leg symptoms, and increased strength, ambulating well.  Abdomen was softer and nontender, and it looked like she had made some great progress.  She was discharged home in stable condition.  DISCHARGE MEDS:  Percocet, Robaxin p.r.n.  She can resume her Coumadin, laxatives, and stool softeners p.r.n., and will follow up in our office in a few weeks.          ______________________________ Clydene Fake, M.D.     JRH/MEDQ  D:  07/28/2010  T:  07/28/2010  Job:  [id number]  Electronically Signed by Colon Branch M.D. on 08/14/2010 05:03:52 PM

## 2010-08-28 ENCOUNTER — Ambulatory Visit (INDEPENDENT_AMBULATORY_CARE_PROVIDER_SITE_OTHER): Payer: Medicare Other | Admitting: *Deleted

## 2010-08-28 DIAGNOSIS — I4891 Unspecified atrial fibrillation: Secondary | ICD-10-CM

## 2010-08-28 DIAGNOSIS — Z7901 Long term (current) use of anticoagulants: Secondary | ICD-10-CM

## 2010-08-28 LAB — POCT INR: INR: 2

## 2010-08-28 NOTE — Patient Instructions (Signed)
INR 2.0  Take extra 1/2 tablet today then resume same dose of 1/2 tablet every day except 1 tablet on Monday.   Recheck INR in 4 weeks.

## 2010-09-04 ENCOUNTER — Ambulatory Visit (INDEPENDENT_AMBULATORY_CARE_PROVIDER_SITE_OTHER): Payer: Medicare Other

## 2010-09-04 DIAGNOSIS — J301 Allergic rhinitis due to pollen: Secondary | ICD-10-CM

## 2010-09-07 LAB — ANAEROBIC CULTURE: Gram Stain: NONE SEEN

## 2010-09-07 LAB — CBC
HCT: 39 % (ref 36.0–46.0)
Hemoglobin: 13.3 g/dL (ref 12.0–15.0)
MCHC: 34.1 g/dL (ref 30.0–36.0)
MCV: 90.2 fL (ref 78.0–100.0)
Platelets: 185 10*3/uL (ref 150–400)
RBC: 4.33 MIL/uL (ref 3.87–5.11)
RDW: 14.9 % (ref 11.5–15.5)
WBC: 7.1 10*3/uL (ref 4.0–10.5)

## 2010-09-07 LAB — BASIC METABOLIC PANEL
BUN: 12 mg/dL (ref 6–23)
CO2: 26 mEq/L (ref 19–32)
Calcium: 9.3 mg/dL (ref 8.4–10.5)
Chloride: 101 mEq/L (ref 96–112)
Creatinine, Ser: 0.84 mg/dL (ref 0.4–1.2)
GFR calc Af Amer: >60 mL/min (ref >60)
GFR calc non Af Amer: >60 mL/min (ref >60)
Glucose, Bld: 106 mg/dL — ABNORMAL HIGH (ref 70–99)
Potassium: 3.8 mEq/L (ref 3.5–5.1)
Sodium: 132 mEq/L — ABNORMAL LOW (ref 135–145)

## 2010-09-07 LAB — WOUND CULTURE
Culture: NO GROWTH
Gram Stain: NONE SEEN

## 2010-09-07 LAB — URINALYSIS, ROUTINE W REFLEX MICROSCOPIC
Bilirubin Urine: NEGATIVE
Glucose, UA: NEGATIVE mg/dL
Hgb urine dipstick: NEGATIVE
Ketones, ur: NEGATIVE mg/dL
Nitrite: NEGATIVE
Protein, ur: NEGATIVE mg/dL
Specific Gravity, Urine: 1.02 (ref 1.005–1.030)
Urobilinogen, UA: 0.2 mg/dL (ref 0.0–1.0)
pH: 6 (ref 5.0–8.0)

## 2010-09-07 LAB — GRAM STAIN: Gram Stain: NONE SEEN

## 2010-09-07 LAB — PROTIME-INR
INR: 1.15 (ref 0.00–1.49)
Prothrombin Time: 14.6 seconds (ref 11.6–15.2)

## 2010-09-07 LAB — APTT: aPTT: 26 seconds (ref 24–37)

## 2010-09-09 LAB — BASIC METABOLIC PANEL
BUN: 14 mg/dL (ref 6–23)
CO2: 27 mEq/L (ref 19–32)
Calcium: 9.1 mg/dL (ref 8.4–10.5)
Chloride: 105 mEq/L (ref 96–112)
Creatinine, Ser: 0.8 mg/dL (ref 0.4–1.2)
GFR calc Af Amer: >60 mL/min (ref >60)
GFR calc non Af Amer: >60 mL/min (ref >60)
Glucose, Bld: 105 mg/dL — ABNORMAL HIGH (ref 70–99)
Potassium: 4.1 mEq/L (ref 3.5–5.1)
Sodium: 140 mEq/L (ref 135–145)

## 2010-09-09 LAB — URINE MICROSCOPIC-ADD ON

## 2010-09-09 LAB — URINALYSIS, ROUTINE W REFLEX MICROSCOPIC
Bilirubin Urine: NEGATIVE
Glucose, UA: NEGATIVE mg/dL
Ketones, ur: NEGATIVE mg/dL
Leukocytes, UA: NEGATIVE
Nitrite: NEGATIVE
Protein, ur: NEGATIVE mg/dL
Specific Gravity, Urine: 1.014 (ref 1.005–1.030)
Urobilinogen, UA: 0.2 mg/dL (ref 0.0–1.0)
pH: 5 (ref 5.0–8.0)

## 2010-09-09 LAB — PROTIME-INR
INR: 2.78 — ABNORMAL HIGH (ref 0.00–1.49)
Prothrombin Time: 29.1 seconds — ABNORMAL HIGH (ref 11.6–15.2)

## 2010-09-09 LAB — CBC
HCT: 39.8 % (ref 36.0–46.0)
Hemoglobin: 13.5 g/dL (ref 12.0–15.0)
MCHC: 33.9 g/dL (ref 30.0–36.0)
MCV: 91 fL (ref 78.0–100.0)
Platelets: 211 10*3/uL (ref 150–400)
RBC: 4.38 MIL/uL (ref 3.87–5.11)
RDW: 15.4 % (ref 11.5–15.5)
WBC: 5.6 10*3/uL (ref 4.0–10.5)

## 2010-09-09 LAB — HEPATIC FUNCTION PANEL
ALT: 49 U/L — ABNORMAL HIGH (ref 0–35)
AST: 46 U/L — ABNORMAL HIGH (ref 0–37)
Albumin: 4.5 g/dL (ref 3.5–5.2)
Alkaline Phosphatase: 91 U/L (ref 39–117)
Bilirubin, Direct: 0.2 mg/dL (ref 0.0–0.3)
Indirect Bilirubin: 0.5 mg/dL (ref 0.3–0.9)
Total Bilirubin: 0.7 mg/dL (ref 0.3–1.2)
Total Protein: 6.9 g/dL (ref 6.0–8.3)

## 2010-09-09 LAB — BRAIN NATRIURETIC PEPTIDE: Pro B Natriuretic peptide (BNP): 108 pg/mL — ABNORMAL HIGH (ref 0.0–100.0)

## 2010-09-11 LAB — PROTIME-INR
INR: 1 (ref 0.00–1.49)
INR: 2 — ABNORMAL HIGH (ref 0.00–1.49)
Prothrombin Time: 12.7 seconds (ref 11.6–15.2)
Prothrombin Time: 22.5 seconds — ABNORMAL HIGH (ref 11.6–15.2)

## 2010-09-11 LAB — CBC
HCT: 39.9 % (ref 36.0–46.0)
Hemoglobin: 13.3 g/dL (ref 12.0–15.0)
MCHC: 33.4 g/dL (ref 30.0–36.0)
MCV: 87.8 fL (ref 78.0–100.0)
Platelets: 161 10*3/uL (ref 150–400)
RBC: 4.54 MIL/uL (ref 3.87–5.11)
RDW: 16.3 % — ABNORMAL HIGH (ref 11.5–15.5)
WBC: 7.9 10*3/uL (ref 4.0–10.5)

## 2010-09-11 LAB — URINALYSIS, ROUTINE W REFLEX MICROSCOPIC
Bilirubin Urine: NEGATIVE
Glucose, UA: NEGATIVE mg/dL
Hgb urine dipstick: NEGATIVE
Ketones, ur: NEGATIVE mg/dL
Leukocytes, UA: NEGATIVE
Nitrite: NEGATIVE
Protein, ur: 30 mg/dL — AB
Specific Gravity, Urine: 1.04 — ABNORMAL HIGH (ref 1.005–1.030)
Urobilinogen, UA: 0.2 mg/dL (ref 0.0–1.0)
pH: 5.5 (ref 5.0–8.0)

## 2010-09-11 LAB — URINE MICROSCOPIC-ADD ON

## 2010-09-11 LAB — BASIC METABOLIC PANEL
BUN: 22 mg/dL (ref 6–23)
CO2: 28 mEq/L (ref 19–32)
Calcium: 9.3 mg/dL (ref 8.4–10.5)
Chloride: 103 mEq/L (ref 96–112)
Creatinine, Ser: 0.83 mg/dL (ref 0.4–1.2)
GFR calc Af Amer: >60 mL/min (ref >60)
GFR calc non Af Amer: >60 mL/min (ref >60)
Glucose, Bld: 164 mg/dL — ABNORMAL HIGH (ref 70–99)
Potassium: 4.7 mEq/L (ref 3.5–5.1)
Sodium: 141 mEq/L (ref 135–145)

## 2010-09-11 LAB — APTT
aPTT: 20 seconds — ABNORMAL LOW (ref 24–37)
aPTT: 26 seconds (ref 24–37)

## 2010-09-11 LAB — GLUCOSE, CAPILLARY: Glucose-Capillary: 100 mg/dL — ABNORMAL HIGH (ref 70–99)

## 2010-09-14 LAB — POCT I-STAT, CHEM 8
BUN: 20 mg/dL (ref 6–23)
BUN: 20 mg/dL (ref 6–23)
Calcium, Ion: 1.08 mmol/L — ABNORMAL LOW (ref 1.12–1.32)
Calcium, Ion: 1.16 mmol/L (ref 1.12–1.32)
Chloride: 105 mEq/L (ref 96–112)
Chloride: 108 mEq/L (ref 96–112)
Creatinine, Ser: 0.9 mg/dL (ref 0.4–1.2)
Creatinine, Ser: 0.9 mg/dL (ref 0.4–1.2)
Glucose, Bld: 105 mg/dL — ABNORMAL HIGH (ref 70–99)
Glucose, Bld: 109 mg/dL — ABNORMAL HIGH (ref 70–99)
HCT: 45 % (ref 36.0–46.0)
HCT: 46 % (ref 36.0–46.0)
Hemoglobin: 15.3 g/dL — ABNORMAL HIGH (ref 12.0–15.0)
Hemoglobin: 15.6 g/dL — ABNORMAL HIGH (ref 12.0–15.0)
Potassium: 4.6 mEq/L (ref 3.5–5.1)
Potassium: 5.1 mEq/L (ref 3.5–5.1)
Sodium: 138 mEq/L (ref 135–145)
Sodium: 139 mEq/L (ref 135–145)
TCO2: 22 mmol/L (ref 0–100)
TCO2: 27 mmol/L (ref 0–100)

## 2010-09-14 LAB — POCT URINALYSIS DIP (DEVICE)
Bilirubin Urine: NEGATIVE
Glucose, UA: NEGATIVE mg/dL
Ketones, ur: NEGATIVE mg/dL
Nitrite: NEGATIVE
Protein, ur: NEGATIVE mg/dL
Specific Gravity, Urine: <=1.005 (ref 1.005–1.030)
Urobilinogen, UA: 0.2 mg/dL (ref 0.0–1.0)
pH: 5.5 (ref 5.0–8.0)

## 2010-09-25 ENCOUNTER — Ambulatory Visit (INDEPENDENT_AMBULATORY_CARE_PROVIDER_SITE_OTHER): Payer: Medicare Other | Admitting: *Deleted

## 2010-09-25 DIAGNOSIS — I4891 Unspecified atrial fibrillation: Secondary | ICD-10-CM

## 2010-09-25 LAB — POCT INR: INR: 2.1

## 2010-09-29 ENCOUNTER — Encounter: Payer: Self-pay | Admitting: Internal Medicine

## 2010-09-29 ENCOUNTER — Ambulatory Visit (INDEPENDENT_AMBULATORY_CARE_PROVIDER_SITE_OTHER): Payer: Medicare Other | Admitting: Internal Medicine

## 2010-09-29 VITALS — BP 112/68 | HR 81 | Ht 66.0 in | Wt 201.6 lb

## 2010-09-29 DIAGNOSIS — R609 Edema, unspecified: Secondary | ICD-10-CM

## 2010-09-29 DIAGNOSIS — G4730 Sleep apnea, unspecified: Secondary | ICD-10-CM

## 2010-09-29 DIAGNOSIS — J309 Allergic rhinitis, unspecified: Secondary | ICD-10-CM

## 2010-09-29 NOTE — Assessment & Plan Note (Signed)
Adequate control on allergy vaccine. She can try adding an otc antihistamine for rhinorhea

## 2010-09-29 NOTE — Patient Instructions (Signed)
Continue allergy vaccine. You can add an otc antihistamine like Claritin or Allegra if needed for runny nose  Please call as needed. I hope your back gets better quickly.

## 2010-09-29 NOTE — Progress Notes (Signed)
  Subjective:    Patient ID: [name], female    DOB: December 17, 1940, 70 y.o.   MRN: [id number]  HPI 8 yoF followed for allergic rhinitis, hx sleep apnea, complicated by AtrialFib.  3 months ago had lumbar spine surgery with rods and screws, complicated by ileitis. Denies respiratory complications.  Mild sleep apnea AHI 6.3- never tolerated CPAP. Confined more indoors with less exposure to Spring pollen. Taking her allergy shots, GO 1:10. Occasional allegra works ok. To see Dr [name] next week for f/u since feet have been swelling. Noting vasomotor rhinorhea before meals. She had glaucoma before cataract surgery so we chose not to start ipratropium.  Review of Systems Constitutional:   No weight loss, night sweats,  Fevers, chills, fatigue, lassitude. HEENT:   No headaches,  Difficulty swallowing,  Tooth/dental problems,  Sore throat,                No sneezing, itching, ear ache, nasal congestion, post nasal drip,   CV:  No chest pain,  Orthopnea, PND,anasarca, dizziness, palpitations  GI  No heartburn, indigestion, abdominal pain, nausea, vomiting, diarrhea, change in bowel habits, loss of appetite  Resp: No shortness of breath with exertion or at rest.  No excess mucus, no productive cough,  No non-productive cough,  No coughing up of blood.  No change in color of mucus.  No wheezing.  No chest wall deformity  Skin: no rash or lesions.  GU: no dysuria, change in color of urine, no urgency or frequency.  No flank pain.  MS:  No joint pain or swelling.  No decreased range of motion.  No back pain.  Psych:  No change in mood or affect. No depression or anxiety.  No memory loss.     Objective:   Physical Exam General- Alert, Oriented, Affect-appropriate, Distress- none acute       Holding back stiffly Skin- rash-none, lesions- none, excoriation- none  Lymphadenopathy- none  Head- atraumatic  Eyes- Gross vision intact, PERRLA, conjunctivae clear secretions  Ears- OK-   Hearing, canals, Tm L ,   R ,  Nose- Clear, No- Septal dev, mucus, polyps, erosion, perforation   Throat- Mallampati II , mucosa clear , drainage- none, tonsils- atrophic  Neck- flexible , trachea midline, no stridor , thyroid nl, carotid no bruit  Chest - symmetrical excursion , unlabored     Heart/CV- RRR , no murmur , no gallop  , no rub, nl s1 s2                     - JVD- none , edema- 2+, stasis changes- none, varices- none     Lung- clear to P&A, wheeze- none, cough- none , dullness-none, rub- none     Chest wall-  Abd- tender-no, distended-no, bowel sounds-present, HSM- no  Br/ Gen/ Rectal- Not done, not indicated  Extrem- cyanosis- none, clubbing, none, atrophy- none, strength- nl  Neuro- grossly intact to observation           Assessment & Plan:

## 2010-09-29 NOTE — Assessment & Plan Note (Addendum)
She was very mild and we didn't push when she failed to tolerate CPAP. I asked her to query husband about snoring etc. If the apnea is of concern re her AFib and edema we can reassess. We had reviewed conservative measures- side sleeping is limited for now as she recovers slowly from spine surgery. Watch out for sedating analgesics and respiratory depression.

## 2010-09-30 ENCOUNTER — Encounter: Payer: Self-pay | Admitting: Internal Medicine

## 2010-09-30 NOTE — Assessment & Plan Note (Signed)
Peripheral edema evident on exam today with negative Homan's. Reduced mobility since surgery likely aggravating. She is to f/u with Dr [name] soon.

## 2010-10-07 ENCOUNTER — Ambulatory Visit: Payer: Medicare Other | Admitting: Cardiovascular Disease

## 2010-10-07 ENCOUNTER — Ambulatory Visit (INDEPENDENT_AMBULATORY_CARE_PROVIDER_SITE_OTHER): Payer: Medicare Other | Admitting: Cardiovascular Disease

## 2010-10-07 ENCOUNTER — Encounter: Payer: Self-pay | Admitting: Cardiovascular Disease

## 2010-10-07 DIAGNOSIS — E785 Hyperlipidemia, unspecified: Secondary | ICD-10-CM

## 2010-10-07 DIAGNOSIS — R609 Edema, unspecified: Secondary | ICD-10-CM

## 2010-10-07 DIAGNOSIS — I4891 Unspecified atrial fibrillation: Secondary | ICD-10-CM

## 2010-10-07 DIAGNOSIS — Z79899 Other long term (current) drug therapy: Secondary | ICD-10-CM

## 2010-10-07 MED ORDER — FUROSEMIDE 20 MG PO TABS: 20.0000 mg | ORAL_TABLET | Freq: Every day | ORAL | Status: DC

## 2010-10-07 MED ORDER — POTASSIUM CHLORIDE 10 MEQ PO TBCR: 10.0000 meq | EXTENDED_RELEASE_TABLET | Freq: Every day | ORAL | Status: DC

## 2010-10-07 NOTE — Progress Notes (Signed)
[name] is a 70 yo female with a history of paroxysmal atrial fibrillation on chronic Coumadin therapy.  She has a history of chest discomfort.  She had heart catheterization in Feb. 2002 that demonstrated normal coronary arteries and normal LV function.  A Myoview study done in July 2009 demonstrated no ischemia.  Her last echocardiogram done in July 2008 demonstrated an ejection fraction of 60%, mild LVH, mild LAE.  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  She's had multiple back surgeries.  Finally out of her back brace.  Has gained 11 lbs over the last 3 months and feels swelling in her arms and legs.  Seen by Dr Richardo Priest but diuretic not changed.  Tries to watch salt in her diet  ROS: Denies fever, malais, weight loss, blurry vision, decreased visual acuity, cough, sputum, SOB, hemoptysis, pleuritic pain, palpitaitons, heartburn, abdominal pain, melena, l claudication, or rash.   General: Affect appropriate Healthy:  appears stated age HEENT: normal Neck supple with no adenopathy JVP normal no bruits no thyromegaly Lungs clear with no wheezing and good diaphragmatic motion Heart:  S1/S2 no murmur,rub, gallop or click PMI normal Abdomen: benighn, BS positve, no tenderness, no AAA no bruit.  No HSM or HJR Distal pulses intact with no bruits Plus one bilateral  edema Neuro non-focal Skin warm and dry No muscular weakness   Current Outpatient Prescriptions  Medication Sig Dispense Refill  . Ascorbic Acid (VITAMIN C) 500 MG tablet Take 500 mg by mouth daily.        . bimatoprost (LUMIGAN) 0.03 % ophthalmic drops Place 1 drop into both eyes at bedtime.        Marland Kitchen CALCIUM-MAGNESIUM-ZINC PO Take 1 tablet by mouth daily.        . Coenzyme Q10 (COQ10) 100 MG CAPS Take 100 mg by mouth daily.        Marland Kitchen diltiazem (CARDIZEM CD) 240 MG 24 hr capsule Take 240 mg  by mouth daily.        Marland Kitchen docusate sodium (COLACE) 100 MG capsule Take 100 mg by mouth 2 (two) times daily.        . fexofenadine (ALLEGRA) 180 MG tablet Take 180 mg by mouth as needed.       . Flaxseed, Linseed, 1200 MG CAPS Take 1,200 mg by mouth daily.        Marland Kitchen gabapentin (NEURONTIN) 300 MG capsule Take 1 tablet by mouth 2 (two) times daily.       Marland Kitchen GLUCOSAMINE CHONDROITIN COMPLX PO Take 1 tablet by mouth daily. 1500/1200      . metoprolol (TOPROL-XL) 100 MG 24 hr tablet Take 100 mg by mouth daily.        . Multiple Vitamin (MULTIVITAMIN) tablet Take 1 tablet by mouth daily.        . Omega-3 Fatty Acids (FISH OIL) 1200 MG CAPS Take 1,200 mg by mouth daily.        Marland Kitchen omeprazole (PRILOSEC) 20 MG capsule Take 20 mg by mouth daily.        . pramipexole (MIRAPEX) 0.5 MG tablet Take 0.5 mg by mouth at bedtime as needed.        . pravastatin (PRAVACHOL) 40 MG tablet Take 40 mg by mouth daily.        Marland Kitchen senna (SENOKOT) 8.6 MG tablet Take 1 tablet by mouth  at bedtime.        . triamterene-hydrochlorothiazide (DYAZIDE) 37.5-25 MG per capsule Take 1 capsule by mouth daily.        Marland Kitchen warfarin (COUMADIN) 5 MG tablet Take by mouth as directed.          Allergies  Review of patient's allergies indicates no known allergies.  Electrocardiogram:  Assessment and Plan

## 2010-10-07 NOTE — Assessment & Plan Note (Signed)
Good rate control and anticoagulation  

## 2010-10-07 NOTE — Assessment & Plan Note (Signed)
Not likely to be cardiac.  No history of CHF.  Change diuretic to Lasix 20mg  and add KCL.  F/U echo and LE venous duplex in 8-10 weeks.  Check BMET and BNP then

## 2010-10-07 NOTE — Assessment & Plan Note (Signed)
Cholesterol is at goal.  Continue current dose of statin and diet Rx.  No myalgias or side effects.  F/U  LFT's in 6 months. No results found for this basename: LDLCALC             

## 2010-10-07 NOTE — Patient Instructions (Addendum)
Your physician recommends that you schedule a follow-up appointment in: 8-10 WEEKS WITH DR [facility]  Your physician has recommended you make the following change in your medication: STOP TRIAMTERENE/HCTZ 37.5/25 MG START FUROSEMIDE 20 MG EVERY DAY AND KLOR CON 10 MEQ EVERY DAY  Your physician recommends that you return for lab work in: IN 8-10 WEEKS BMET BNP  V58.69 428.0 Your physician has requested that you have an echocardiogram. Echocardiography is a painless test that uses sound waves to create images of your heart. It provides your doctor with information about the size and shape of your heart and how well your heart's chambers and valves are working. This procedure takes approximately one hour. There are no restrictions for this procedure. 8-10 WEEKS SEE DR [name] SAME DAY  Your physician has requested that you have a lower or upper extremity venous duplex. This test is an ultrasound of the veins in the legs or arms. It looks at venous blood flow that carries blood from the heart to the legs or arms. Allow one hour for a Lower Venous exam. Allow thirty minutes for an Upper Venous exam. There are no restrictions or special instructions. 8-10 WEEKS SEE DR [name] SAME DAY

## 2010-10-20 NOTE — Assessment & Plan Note (Signed)
[facility]                            CARDIOLOGY OFFICE NOTE   NAME:[name]                    MRN:          [id number]  DATE:12/25/2007                            DOB:          05/06/1941    [name] returns today for followup.   I have seen her for hypertension, hypercholesterolemia, chest pain,  history of PAF, and palpitations.   The patient had a 30-day event monitor.  On review, the event monitor  appears that she has had more PACs than anything else.  I did not see  any significant Afib.  She did have occasional runs also that may have  been an atrial arrhythmia or PSVT.  She is already on a beta-blocker and  Cardizem.  I would like her to see EP in regards to any other  recommendations.  I do not particularly think that she should be on  flecainide or antiarrhythmic.  In talking to her, she is getting  palpitations 2-3 times per week.  She describes them as more of a  nuisance.  There has been no presyncope, chest pain, diaphoresis, or  shortness of breath with them.   She has been compliant with her meds.  She had a Myoview study today,  which was normal.  I reviewed these images with her.   She has no documented coronary artery disease with a previously normal  cath in 2002.   Coronary risk factors include hypercholesterolemia and hypertension.  She is getting used to her statin and Zetia without significant side  effects now and she has been compliant with her blood pressure meds.   Her review of systems is otherwise negative.   MEDICATIONS:  1. Aspirin a day.  2. Zegerid 40 a day.  3. Centrum.  4. Allergy shots.  5. Fish oil.  6. Toprol 50 a day.  7. Calcium.  8. Warfarin as directed for PAF.  9. Red yeast rice.  10.Cardizem 120 a day.  11.Pravastatin 20 a day.   Exam is remarkable for an elderly white female in no distress.  Her  weight is 191, blood pressure 150/70, pulse 79 and regular, respiratory  rate 14,  and afebrile.  HEENT is unremarkable.  Carotids are normal  without bruit.  No lymphadenopathy, thyromegaly, or JVP elevation.  Lungs are clear with good diaphragmatic motion.  No wheezing.  S1 and S2  with normal heart sounds.  PMI normal.  Abdomen is benign.  Bowel sounds  positive.  No AAA.  No tenderness.  No bruit.  No hepatosplenomegaly or  hepatojugular reflux.  No tenderness.  Distal pulses are intact.  No  edema.  Neuro is nonfocal.  Skin is warm and dry.  No muscular weakness.   Her Myoview was reviewed.  Her event monitor was reviewed.   IMPRESSION:  1. Previous chest pain normal cath and O2.  Nonischemic Myoview today.      Continue aspirin therapy.  2. History of paroxysmal atrial fibrillation.  Continue Coumadin      therapy.  Follow up in the clinic.  3. Palpitations.  Event monitor showing no runs of  atrial arrhythmia.      Continue beta-blocker and Cardizem.  Refer to EP for question of      initiation of antiarrhythmic versus electrophysiology study to      assess for other atrial arrhythmias such as atrioventricular nodal      reentrant tachycardia.  4. Hypercholesterolemia.  Follow up with Dr.  Manus Gunning.  Continue      pravastatin and Zetia.  5. Hypertension, currently well controlled.  Continue current dose of      beta-blocker and Cardizem.  6. General malaise, weight gain, and question of depression.  Follow      up with Dr. Manus Gunning.  [name] seems to have issues regarding all of      this.  She thinks her metabolism is changing.  I do not see that      she is on any particular medicine that would cause this.  She may      need further hormonal replacement.  I will have to look back      through the chart to make sure she has had a recent TSH and T4, but      I suspect these are issues that Dr. Manus Gunning can deal with.     [name]. [name], MD, [facility]  Electronically Signed    PCN/MedQ  DD: 12/25/2007  DT: 12/26/2007  Job #: [id number]

## 2010-10-20 NOTE — Letter (Signed)
January 03, 2008    [name]. [name], MD, [facility]  [address]  [address]  [place], [place] [address]   RE:  [name], [name]  MRN:  [id number]  /  DOB:  [birth year]   Dear [name]:   It was a pleasure to see [name] today at your request for atrial  arrhythmias.   As you know she is a 70 year old hypertensive woman with a history of  repeated episodes of atrial fibrillation with a rapid ventricular  response in the context of chronic hypertension.  Toprol has been used  for the latter and then increased for the former.  She has had  intermittent problems with palpitations occurring a couple of times a  month lasting just seconds.  These were described as racing.  Event  recorder was utilized in February demonstrating nonsustained runs of  atrial tachycardia as well as occasional PVCs.  The former of which is  the more likely culprit.   Her thromboembolic risk factors are notable for hypertension and  borderline age.   Her major complaint is fatigue.  She has daytime somnolence and snores.   She has had an extensive cardiac evaluation including a CAT scan, recent  stress testing and echo, which have demonstrated no significant  obstructive coronary artery disease, mild left atrial enlargement,  normal left ventricular function, and I presume based on her exertional  dyspnea that she has some degree of diastolic dysfunction.   I should note that when she had these episodes of atrial fibrillation,  which have been rather relatively infrequent they are characterized by  shortness of breath as well as chest tightness, a general sense of  malaise, and often systolic hypertension.   She does not use caffeine or over-the-counter cold medicines.   PAST MEDICAL HISTORY:  Her past medical history in addition to above is  notable for:  1. Glaucoma.  2. Spinal arthritis.  3. Problems with gas and diverticulitis.  4. A recent weight gain with normal thyroid functions.  5. Seasonal  allergies.   PAST SURGICAL HISTORY:  Notable for hysterectomy and nasal surgery.   SOCIAL HISTORY:  She is married.  She has 2 children, one great-  grandchild.  She does not use cigarettes, alcohol, or recreational  drugs, and she continues to work for [employer] for 42 years.   MEDICATIONS:  Her medication at present include aspirin 81, which we  will discontinue; Xalatan eye drops; Zegerid 40; fish oil;  Toprol 50,  which we will discontinue; and warfarin.   ALLERGIES:  She has no known drug allergies.   PHYSICAL EXAMINATION:  GENERAL:  She is an elderly Caucasian female  appearing her stated age of 87.  VITAL SIGNS:  Her blood pressure is 142/81.  Her pulse is 67.  Her  weight was 192, which is up about 5 pounds in the last year.  HEENT:  Demonstrated no icterus or xanthoma.  NECK:  The neck veins were flat.  The carotids were brisk and full  bilaterally without bruits.  BACK:  Without kyphosis or scoliosis.  LUNGS:  Clear.  HEART:  Heart sounds were regular without murmurs or gallops.  ABDOMEN:  Soft with active bowel sounds without midline pulsation or  hepatomegaly.  EXTREMITIES:  Femoral pulses were 2+.  Distal pulses were intact.  There  is no clubbing, cyanosis, or edema.  NEUROLOGIC:  Grossly normal.  SKIN:  Warm and dry.   Review of electrocardiograms demonstrated sinus rhythm from last year  on  January 16, 2008, at a rate of 60 with intervals of 0.17/0.08/0.39, the  axis was 21 degrees.  The electrogram was otherwise normal.  Review of  the event recorder from February 2009 demonstrated occasional PVCs as  well as short nonsustained runs of atrial tachycardia, and the review of  the other notes described atrial fibrillation with a rapid ventricular  response.   Laboratory sent from Dr. Randel Books office were notable for an elevated  cholesterol with an LDL of 150.   IMPRESSION:  1. Paroxysmal atrial fibrillation with a rapid ventricular response.  2. Symptomatic  atrial runs.  3. Likely obstructive sleep apnea manifested by snoring, daytime      somnolence, and potentially contributing to the above.  4. [place] score of 1 on Coumadin and aspirin.  5. Fatigue, question related to the above.   [name] Arista, Ms. Panagopoulos has atrial arrhythmias, which are for her mildly  disconcerting and she was certainly open to reassurance that they were  not life-threatening.  I gave her that.  I told her that these runs  might be triggered because of her atrial fibrillation, but we would with  that as they came.   I wonder to what degree her sleep apnea is contributing.  Certainly, she  has symptoms to suggest that she has and it is certainly been associated  with atrial fibrillation, so I have taken the liberty as she has seen  Dr. Jetty Duhamel in the past and setting her up with a sleep study and  in consultation with him in the event that the sleep study is abnormal.  I have touched base with Dr. Manus Gunning also to let him know, he was out of  town, so I will forward this by mail to him.   In addition, the Toprol has been problematic in the past and so I have  taken the liberty of stopping it.  I see that she had been on Cardizem  at 120.  She did not recall that, but I have given her prescription for  Cardizem 180 for rate control and if she needs augmented blood pressure  control, ACE inhibitors and ARBs have been shown to decrease the  frequency of atrial fibrillation and would be probably better.  The data  about this long-term benefits of beta blockers as a antihypertensive  therapy are also somewhat discouraging at this point as you know.   I have also taken the liberty of stopping her aspirin, as I am not aware  of data of its benefit for her.  She is to see me in a couple of months  and if there is specific indications for its resumption, I told that you  would do that.   Thank you very much for allowing [place] to see her.  If there is anything  that I can do  in the interim, please do not hesitate to contact me.    Sincerely,      Duke Salvia, MD, [facility]  Electronically Signed    SCK/MedQ  DD: 01/03/2008  DT: 01/04/2008  Job #: [id number]   CC:    Bryan Lemma. Manus Gunning, M.D.

## 2010-10-20 NOTE — Assessment & Plan Note (Signed)
[facility]                            CARDIOLOGY OFFICE NOTE   NAME:[name], [name]                    MRN:          [id number]  DATE:07/10/2007                            DOB:          [birth year]    [name] returns for followup.  She has had paroxysmal atrial  fibrillation with hypertension.  She continues to have episodes.  She  needs an event monitor 30-day for a fib.  She continues to have episodes  of irregular heartbeats.  I told her she would need an event monitor for  30 days.  It is important for [place] to figure out if she is having  continued A fib.  She is on a baby aspirin and warfarin.   She was recently started on Pravachol by Dr. Manus Gunning.  She feels poorly  on it.  She feels like she is gaining weight has some lower extremity  pain.  I told her to talk to Dr. Manus Gunning about switching to a different  medication.  I believe her overall cholesterol was over 200.  She does  not have critical coronary disease but she did have a cardiac CT on December 23, 2006, which showed a calcium score over 100 with moderate disease in  the mid right and diagonal branch.  Therefore risk factor modification  is worthwhile.  She is not having any significant chest pain.  Her  review of systems otherwise negative.   Current medications include baby aspirin a day, Zegerid 40 a day,  multivitamins.  Allergy shots.  Fish oil, Toprol 50 a day.  Red yeast,  calcium, warfarin as directed.   EXAM:  Is remarkable for a elderly white female in no distress.  Weight is 187, blood pressure 150/80, pulse 65 and regular, no PVCs or a  fib was rate 14, afebrile.  HEENT:  Unremarkable.  Carotids are without bruit, no lymphadenopathy, thyromegaly, JVP  elevation.  LUNGS:  Clear to diaphragmatic motion.  No wheezing.  ABDOMEN:  Benign.  Bowel sounds positive.  No AAA no bruit No  hepatosplenomegaly or hepatojugular reflux, pulse intact, no edema.  NEURO:  Nonfocal.  SKIN:   Warm and dry.  No muscular weakness.   IMPRESSION:  1. Coronary disease.  Follow-up Myoview July 2009.  2. Hypercholesterolemia in the setting of an elevated calcium score      probable change in statin drug verses niacin in [place] follow-up      with Dr. Manus Gunning for this.  3. Hypertension currently fairly well controlled.  Continue current      dose of beta blocker and Cardizem 120 a day.  4. Recurrent palpitations in the setting of previous PAF event monitor      to see she is having any recurrences if she has she may need      antiarrhythmic therapy.  5. Question side effects her cholesterol medicine to follow with Dr.      Manus Gunning regarding      switching.  Overall I think her heart is stable.  I will see her      back in July when she has  her stress test.     [name] C. [name], MD, [facility]  Electronically Signed    PCN/MedQ  DD: 07/10/2007  DT: 07/10/2007  Job #: [id number]   cc:   Bryan Lemma. Manus Gunning, M.D.

## 2010-10-20 NOTE — Procedures (Signed)
NAMEVIENNA, FOLDEN           ACCOUNT NO.:  [id number]   MEDICAL RECORD NO.:  [id number]          PATIENT TYPE:  OUT   LOCATION:  SLEEP CENTER                 FACILITY:  [facility]   PHYSICIAN:  Barbaraann Share, MD,FCCPDATE OF BIRTH:  02/11/1943   DATE OF STUDY:  01/25/2008                            NOCTURNAL POLYSOMNOGRAM   REFERRING PHYSICIAN:  Duke Salvia, MD, [facility]   REFERRING PHYSICIAN:  Dr. Berton Mount.   LOCATION:  Sleep lab.   INDICATION FOR STUDY:  Hypersomnia with sleep apnea.   EPWORTH SLEEPINESS SCORE:  12.   SLEEP ARCHITECTURE:  The patient had total sleep time of 323 minutes  with decreased slow wave sleep as well as REM.  Sleep onset latency was  normal at 11 minutes, and REM onset was at the upper limits of normal.  Sleep efficiency was decreased at 85%.   RESPIRATORY DATA:  The patient was found to have 33 apneas and 1  hypopnea for an apnea-hypopnea index of 6 events per hour.  She was also  found to have 25 respiratory effort-related arousals giving her a  respiratory disturbance index of 11 events per hour.  The events were  increased during REM, and there was loud snoring noted throughout.  The  patient did not meet split night criteria secondary to the small numbers  of events.   OXYGEN DATA:  There was O2 desaturation as low as 92% with her events.   CARDIAC DATA:  No clinically significant arrhythmias were noted.   MOVEMENT/PARASOMNIA:  The patient was found to have no significant leg  jerks or abnormal behaviors.   IMPRESSIONS/RECOMMENDATIONS:  Very mild obstructive sleep apnea/hypopnea  syndrome with an apnea-hypopnea index of 6 per hour and a respiratory  disturbance index of 11 per hour.  There was O2 desaturation only as  low as 92%.  Treatment for this degree of sleep apnea can include weight  loss alone if applicable, upper airway surgery, oral appliance, and also  continuous positive airway pressure.  Clinical correlation is  suggested.      Barbaraann Share, MD,FCCP  Diplomate, American Board of Sleep  Medicine  Electronically Signed     [facility]/MEDQ  D:  02/07/2008 08:02:10  T:  02/07/2008 08:47:56  Job:  [id number]   cc:   Duke Salvia, MD, [facility]  [address]  [address]  [place]  [place] [address]

## 2010-10-20 NOTE — Assessment & Plan Note (Signed)
[facility]                            CARDIOLOGY OFFICE NOTE   NAME:[name], [name]                    MRN:          [id number]  DATE:01/16/2007                            DOB:          03-06-1941    [name] returns today for followup.  She has multiple somatic  complaints.  She complains of fatigue, chest pain, and shortness of  breath.  She was recently hospitalized, and found to be in atrial  fibrillation.  Unfortunately, she really did not know she was in it.  She had some chest pain, and woke up hypertensive, and was brought to  the emergency room.  She ruled out for myocardial infarction.  Her chest  pain is atypical.  It is non-exertional.  She has had a normal  catheterization in 2002, two normal Myoviews, and while she was in the  hospital had a cardiac CT which showed less than 50% calcific disease in  the mid right, possibly some ostial D1 disease with a calcium score of  114.  We did not think these were significant.   She converted to normal sinus rhythm on her own.  She was placed on  Coumadin.   In regards to her chest pain, it is atypical.  It is non-exertional.  There is no radiation.  No associated diaphoresis.  It is intermittent,  lasting less than a minute or 2.  She gets it maybe 2 to 3 times per  week.  I explained to her I did not think it was cardiac in nature.   In regards to her fatigue, I think this is functional.  She has normal  LV function.  She has not had any previous documentation for anemia or  hypothyroidism.  Her thyroid was checked while she was in the hospital,  and it was normal.  I suspect she needs to be on an exercise program.   In regards to her atrial fibrillation, again, she does not always notice  palpitations.  I taught her how to take her right radial pulse while we  were in the room.  Unfortunately, this makes it very difficult to know  when to stop her Coumadin.   Her anticoagulation has been  going well.  She is bruising easier, but  there has been no bleeding diaphysis.  No change in stool color, and no  evidence of epistasis.   Her review of systems is otherwise negative.   CURRENT MEDICATIONS:  Include:  1. Aspirin a day.  2. Zegerid 40 a day.  3. Vitamins.  4. Allergy shots.  5. Fish oil.  6. Toprol 50 a day.  7. Warfarin as directed.  8. Cardizem 120 a day.   EXAMINATION:  Remarkable for a pulse of 60 and regular.  Blood pressure  is 140/70.  She is afebrile.  Weight is 184.  Respiratory rate is 14.  HEENT:  Normal.  Carotids normal without bruits.  There is no JVP elevation.  No  lymphadenopathy.  No thyromegaly.  LUNGS:  Clear with diaphragmatic motion.  No wheezing.  There is an S1 and S2 with normal heart sounds.  PMI is normal.  ABDOMEN:  Benign.  Bowel sounds positive.  No tenderness.  No  hepatosplenomegaly.  No hepatojugular reflux.  No AAA.  No renal bruits.  Femorals are +3 bilaterally without bruit.  PTs are +3.  There is no  lower extremity edema.  NEUROLOGIC:  Nonfocal.  There is no muscular weakness.   Her EKG is currently normal with a heart rate of 60, PR interval of 174,  QT interval of 388.   IMPRESSION:  1. Paroxysmal atrial fibrillation.  Currently maintaining sinus      rhythm.  Will call us if her pulse appears irregular by exam at      home.  Continue Coumadin anticoagulation.  Follow up in the      Coumadin Clinic in 3 weeks.  2. Hypertension in the setting paroxysmal atrial fibrillation.      Continue current dose of Toprol and Cardizem.  Currently, blood      pressure under good control.  Continue low-salt diet.  3. Atypical chest pain in the setting of normal catheterization in      2002, two normal Myoviews, and a low-risk cardiac CT this      hospitalization.  Reassurance given.  Continue baby aspirin a day.  4. History of reflux.  Continue Zegerid 40 a day.  Low-salt and low-      spice diet.  5. History of  hypertriglyceridemia.  Continue fish oil 1 gm a day.      She does have a calcium score of 114.  We will have to look back to      see what her cholesterol records are, but I do not think my      inclination would be to start her on a statin at this time.   I will see her back in about 3 months.     [name]. [name], MD, [facility]  Electronically Signed    PCN/MedQ  DD: 01/16/2007  DT: 01/17/2007  Job #: [id number]

## 2010-10-20 NOTE — Assessment & Plan Note (Signed)
[facility]                            CARDIOLOGY OFFICE NOTE   NAME:WHITESELLAshton, [name]                    MRN:          [id number]  DATE:06/25/2008                            DOB:          04-Aug-1940    [name] returns today for followup.  I have seen her for PAF,  palpitations, and chest pain.   She has been doing fairly well.  She retired from [employer] in December  after working 47 plus years.  I congratulated her on this.   She had a recent Myoview study in July, which was nonischemic.   She has not had recurrent atrial fibrillation, but continues on  Coumadin.   Her INRs have been therapeutic.   She apparently was taken off her beta-blocker by sleep apnea doctor.  She is having problems wearing a CPAP mask.  Her Cardizem was increased  to 180 a day.   The patient's coronary artery risk factors include hypertension and  hypercholesterolemia.   Overall, I think she is doing well.   REVIEW OF SYSTEMS:  Remarkable for some left upper and left lower  quadrant pain.  It is not associated with food.  There is no associated  nausea or vomiting.  She has not had any melena or diarrhea.   CURRENT MEDICATIONS:  1. Warfarin as directed.  2. Cardizem 180 a day.  3. Pravastatin 20 a day.  4. Fish oil.   ALLERGIES:  She has no known allergies.   PHYSICAL EXAMINATION:  GENERAL:  Remarkable for an overweight female in  no distress.  VITAL SIGNS:  Her blood pressure is 124/80; pulse 80 and regular;  respiratory rate 14, afebrile; and weight 196.  HEENT:  Unremarkable.  NECK:  Carotids are normal without bruit.  No lymphadenopathy,  thyromegaly, or JVP elevation.  LUNGS:  Clear, good diaphragmatic motion.  No wheezing.  HEART:  S1 and S2.  Normal heart sounds.  PMI normal.  ABDOMEN:  Benign.  Bowel sounds are positive.  There is no rebound.  There is a bit of firmness under the left costophrenic angle.  AAA is  not palpable.  No right  upper quadrant pain.  EXTREMITIES:  Distal pulses are intact.  No edema.  NEURO:  Nonfocal.  SKIN:  Warm and dry.  MUSCULOSKELETAL:  No muscular weakness.   IMPRESSION:  1. Previous chest pain, nonischemic Myoview.  Continue low-dose      aspirin.  2. Palpitations and history of paroxysmal atrial fibrillation.      Currently maintaining sinus rhythm.  Continue Cardizem.  Beta-      blocker stopped by pulmonary doctor.  3. Sleep apnea.  Followup with Pulmonary.  The patient is unable to      wear a continuous positive airway pressure mask.  4. Anticoagulation.  Followup in the Coumadin Clinic, particularly      given the fact that she is on Coumadin.  I would like to do a      followup CT scan to rule out the bleed in her belly.  5. Abdominal pain.  Check CT scan with oral contrast, rule  out      diverticulitis or hematoma or bleeding since the patient is on      Coumadin.  6. Hypercholesterolemia.  Continue statin therapy.  Lipid and liver      profile per Dr. Manus Gunning.   Further recommendations will be based on the results of her CT scan, but  I suspect her heart is doing fine and hopefully we will not find  anything bad on her CAT scan.  She knows to follow up with Dr. Manus Gunning  should her abdominal pain worsen.     [name]. [name], MD, [facility]  Electronically Signed    PCN/MedQ  DD: 06/25/2008  DT: 06/25/2008  Job #: [id number]   cc:   Bryan Lemma. Manus Gunning, M.D.

## 2010-10-20 NOTE — Assessment & Plan Note (Signed)
[facility]                             PULMONARY OFFICE NOTE   NAME:[name], [name]                    MRN:          [id number]  DATE:01/31/2007                            DOB:          25-May-1941    PROBLEM LIST:  1. Allergic rhinitis.  2. Coronary disease.  3. Degenerative joint disease.  4. Glaucoma.   HISTORY:  She had an episode of paroxysmal atrial fibrillation and has  been put on Coumadin.  She had a single local reaction episode mid way  through a bottle of allergy vaccine, not sure of what happened but there  has been no recurrence.  She never wheezes.  She also had a local  reaction to bee sting.  Overall, she has felt stable from a nose and  chest standpoint.   MEDICATIONS:  1. Aspirin 81 mg.  2. Xalatan eye drops.  3. Vitamins.  4. Calcium.  5. Allergy vaccine once a week is at 1:10.  6. Toprol 50 mg.  7. Coumadin 5 mg as directed.  8. Diltiazem 120 mg.  9. Menostar.  10.Flax seed.  11.She has an EpiPen which she has not needed.  12.Zyrtec p.r.n.   No medication allergy.   OBJECTIVE:  VITAL SIGNS:  Weight 185 pounds, BP 138/70, pulse 65, room  air saturation 99%.  HEENT:  Nose and throat are clear.  CHEST:  Clear and unlabored.  HEART:  Sounds are regular.  I do not hear a murmur or gallop.  EXTREMITIES:  There is no edema.   IMPRESSION:  Allergic rhinitis, nonspecific event attributed to allergy  vaccine.  It may have been related to trauma at injection site, but I  have educated her again on potential significant reactions.  For now she  can continue giving her allergy injections as before.   PLAN:  Schedule return 1 year, earlier p.r.n.     Clinton D. Maple Hudson, MD, [facility], FACP  Electronically Signed    CDY/MedQ  DD: 02/06/2007  DT: 02/06/2007  Job #: [id number]   cc:   Bryan Lemma. Manus Gunning, M.D.

## 2010-10-20 NOTE — Assessment & Plan Note (Signed)
[facility]                            CARDIOLOGY OFFICE NOTE   NAME:[name], [name]                    MRN:          [id number]  DATE:04/12/2007                            DOB:          [birth year]    [name] returns today for followup.  [name] has had paroxysmal atrial  fibrillation in the past with hypertension.   [name] has had coronary artery disease with some luminal irregularities.  As I recall [name] has had a nonischemic Myoview, [name] also had cardiac CT  with a calcium score of 114 and less than 50% mid right coronary artery  lesion in the 50% soft plaque in the ostium of the first diagonal  branch.   In talking to Teyana [name] continues to complain of intermittent  palpitations, they are infrequent, they do not last long.  [name] knows [name]  can take an extra Lopressor or Cardizem for these.  I told her if they  started to be on a weekly basis [name] should get a CardioNet monitor to  see if [name] is going in and out of atrial fibrillation.   In regards to her dyspnea, [name] is able to walk 2 miles.  [name] seems to  have more shortness of breath going up hills.  There is no history of  PE, chronic lung disease so [name] has normal LV function and these are not  associated with palpitations.  [name] has not been having any significant  chest pain.   [name] has been compliant with her medications.   The patient is currently taking a baby aspirin and Coumadin.   Her INRs have been therapeutic.   REVIEW OF SYSTEMS:  Remarkable for some sort of a cyst on the top of her  head.  [name] was referred to Dr. Jorja Loa by Dr. Manus Gunning.  I told her it was  fine to stop her Coumadin for 4-5 days prior to any procedure.  [name] will  call Dr. Sherryl Barters office to see if there is any chance that he will  actually do any incision during her first visit.  Her review of systems  is otherwise negative.   MEDICATIONS:  1. Baby aspirin a day.  2. Zegerid 40 a day.  3. Vitamins.  4. Allergy  shots that [name] gives herself at home.  5. Toprol 50 a day.  6. Cardizem 120 a day.  7. Warfarin as directed.   EXAMINATION:  Remarkable for being in sinus rhythm at a rate of 70,  blood pressure is 140/80, afebrile, respiratory rate is 12.  HEENT:  Unremarkable.  Carotids normal without bruit, no  lymphadenopathy, no thyromegaly and no JVP elevation.  [name] does have a  somewhat hard cystic-like area at the top of her head.  LUNGS:  Clear, good diaphragmatic motion, no wheezing.  S1-S2 with normal heart sounds, PMI normal.  ABDOMEN:  Benign, bowel sounds positive, no hepatosplenomegaly, no  hepatojugular reflux, no bruits, no tenderness.  Femorals are +3 bilaterally without bruit, distal pulses are intact, no  edema.  NEURO:  Nonfocal, no lymphadenopathy.  SKIN:  Warm and dry.  No muscular weakness.  IMPRESSION:  1. History of paroxysmal atrial fibrillation and infrequent      palpitations, continue aspirin and Coumadin and beta blocker.      CardioNet monitoring if palpitations worsen.  2. Hypertension, currently well-controlled, continue current dose of      Cardizem and beta blocker.  3. History of coronary disease by CT scan, continue risk factor      modification.  Followup Myoview in a year.  4. Anticoagulation, to be seen in the Coumadin clinic.  Will stop      anticoagulation 4-5 days to possible dermatological procedure.  5. History of reflux, continue Zegerid 40 mg a day, a low-spice diet,      avoid late-night meals.  6. Exertional dyspnea, appears functional.  No evidence of significant      cardiopulmonary disease.  Good left ventricular function, no      valvular heart disease.  Continue to work on exercise program, I      will see her back in 6 months.     [name]. [name], MD, [facility]  Electronically Signed    PCN/MedQ  DD: 04/12/2007  DT: 04/13/2007  Job #: [id number]

## 2010-10-20 NOTE — Discharge Summary (Signed)
[name], [name]           ACCOUNT NO.:  [id number]   MEDICAL RECORD NO.:  [id number]          PATIENT TYPE:  INP   LOCATION:  3705                         FACILITY:  [facility]   PHYSICIAN:  [name]. [name], MD, FACCDATE OF BIRTH:  [birth year]   DATE OF ADMISSION:  12/21/2006  DATE OF DISCHARGE:  12/24/2006                               DISCHARGE SUMMARY   DISCHARGE DIAGNOSES:  1. Atrial fibrillation with a rapid ventricular rate.  2. Initiation of anticoagulation therapy.  3. Hypertension.  4. Chest discomfort associated with #1 with a negative chest CT.  5. Hyperlipidemia.  6. History as listed below.   BRIEF HISTORY:  [name] is a 70 year old white female who was  transferred via EMS to [facility] emergency room secondary to chest  discomfort.  After going to bed on the evening prior to presentation she  was unable rest, initially felt hot, followed by a funny feeling which  she elaborated as chest pressure radiating to her neck associated with  headache, shortness of breath and nausea.  She took her blood pressure  and this was 150/115, and her pulse was 143.  She called EMS and  transported.  Upon arrival at the emergency room she was found to be in  atrial fibrillation.  She was unaware of palpitations.   PAST MEDICAL HISTORY:  1. Atypical chest discomfort with prior two negative Myoviews      preceding a catheterization in February 2002, which showed      irregularities in her coronary artery anatomy and an EF of 65%.  2. History of hypertension.  3. DJD.  4. Glaucoma.  5. Allergic rhinitis.  6. Hyperlipidemia.   LABORATORY DAA:  Admission H&H is 13.9 and 41.0, normal indices,  platelets 195, WBC 10.8.  PTT 25, PT 14.2.  At the time of discharge on  the 19th INR was 2.5, PT 28.7.  Sodium 145, potassium 4.1, BUN 18,  creatinine 0.9, glucose 109, normal LFTs.  Initial CK, MB and relative  index were slightly elevated; however, subsequent CK, MBs and relative  indices did not reveal a specific pattern.  Initial troponin was 0.05,  0.18 and 0.07.  Fasting lipids showed a total cholesterol of 183,  triglycerides 174, HDL 47, LDL 101.  TSH was 1.997 and free T4 1.06.  T3  uptake 36.3.  Chest x-ray did not show any active disease.  Abdominal  one-view showed possible 4-mm left ureteral calculus in the pelvis.  EKGs at the time of discharge showed normal sinus rhythm.   HOSPITAL COURSE:  The patient was admitted to [facility] and  placed on IV heparin.  Echocardiogram was performed and this revealed an  EF of 60%, mild LVH, trivial AI with mild aortic valve calcification,  mild left atrial enlargement.  Dr. [name] on review felt that the  patient needs to be on Coumadin with tentative TEE cardioversion on  Friday.  However, overnight she converted to normal sinus rhythm.  Dr.  [name] felt that she should continue to have Coumadin therapy for  anticoagulation, that she did not need antiarrhythmic at this point.  Progression nurse assisted with discharge needs.  Cardiac CT was  performed by Dr. [name] and did not show any significant coronary artery  disease.  On the 19th Dr. [name] felt that she could be discharged home.   DISPOSITION:  She is discharged home.  Activities and wound care are not  applicable.  She was given permission to return to work on Monday, December 26, 2006.   She received new prescriptions for:  1. Toprol 50 mg daily.  2. Cardizem CD 120 mg daily.  3. Coumadin 5 mg daily except for 2.5 mg on Monday, Wednesday and      Friday.   She was asked to continue her:  1. Aspirin 81 mg daily.  2. Xalatan eye drops as previously.  3. Zegerid 40 mg daily.  4. __________ 14 mcg daily.  5. Calcium, magnesium, fish oil, multivitamin, zinc and vitamin E and      C as previously.   She was asked to discontinue glucosamine and red yeast rice as this  interferes with Coumadin.  She was asked to begin a blood pressure  diary, to  bring her diary and all her medications to her appointment.  She will need a PTI next Thursday or Friday and a follow-up appointment  with Dr. [name] in 2-3 weeks.  She was asked to call for these  appointments.   DISCHARGE TIME:  40 minutes.      Joellyn Rued, PA-C      [name]. [name], MD, [facility]  Electronically Signed    EW/MEDQ  D:  12/24/2006  T:  12/24/2006  Job:  [id number]   cc:   Bryan Lemma. Manus Gunning, M.D.  Hilda Lias, M.D.  Clinton D. Maple Hudson, MD, FCCP, FACP

## 2010-10-20 NOTE — H&P (Signed)
[name], [name]           ACCOUNT NO.:  [id number]   MEDICAL RECORD NO.:  [id number]          PATIENT TYPE:  INP   LOCATION:  3705                         FACILITY:  [facility]   PHYSICIAN:  Veverly Fells. Excell Seltzer, MD  DATE OF BIRTH:  10-15-1940   DATE OF ADMISSION:  12/21/2006  DATE OF DISCHARGE:                              HISTORY & PHYSICAL   ORTHOPEDIST:  Dr. Jeral Fruit.   ALLERGIST:  Dr. Maple Hudson.   PRIMARY CARE PHYSICIAN:  Bryan Lemma. Manus Gunning, M.D.   PRIMARY CARDIOLOGIST:  [name]. [name], MD, [facility]   SUMMARY OF HISTORY:  [name] is a 70 year old white female who was  transported via EMS to [facility] emergency room secondary to chest  discomfort.  Ms. Blue initially presented to the emergency room,  Monday evening, with a 6 bee stings as per instructed by her insurance  company. In the emergency room her blood pressure was 181/86 and her  pulse was 71.  She was released from the emergency room, took the day  off yesterday from work, and rested throughout the day.  Yesterday  evening after she went to bed, she was unable to rest.  She initially  felt hot and then described a funny feeling which she further elaborated  as chest pressure radiating into her neck associated with headache,  shortness of breath, and nausea.   She checked her blood pressure and pulse; and it was 150/115 and her  pulse was 143.  This was around 2:30 a.m.  She took two aspirin and  called 9-1-1; EMS report is not available.  She denied any syncope; but  she did admit to some weakness.  She was unaware of any palpitations  despite the rapid pulse.  She states that she has had similar  occurrences of the above symptoms, the last time was approximately 2  years ago; and she felt that it was also similar in February 2002, but  this time it was worse.  In 2002 she had a heart catheterization -- this  did not show any coronary artery disease.   ALLERGIES:  No known drug allergies.   MEDICATIONS  PRIOR TO ADMISSION INCLUDE:  1. Aspirin 81 daily.  2. Toprol XL 25 daily.  3. Xalatan eye drops unknown dosage OU daily.  4. Zegerid 40 mg daily.  5. Menostar 14 mcg daily.  6. Calcium 333 mg daily.  7. Magnesium 133 mg.  8. Fish oil 850 mg daily  9. Zinc 5 mg daily.  10.Vitamin E and C 400/500 daily.  11.Glucosamine chondroitin.  12.Red yeast rice 100 mg daily.  13.Multivitamin daily.   PAST MEDICAL HISTORY:  Notable for chronic atypical chest discomfort.  She has had two negative Myoviews preceding a catheterization on  July 08, 2000 which showed EF of 65% and some irregularities in her  coronary arteries.  She also has a history of hypertension.  She does  not check her blood pressure at home.  DJD, glaucoma, allergic rhinitis,  and hyperlipidemia.  She denies any history of diabetes, CVA, myocardial  infarction, COPD, bleeding dyscrasias, or thyroid dysfunction.   SURGICAL HISTORY:  Notable for a total hysterectomy in 1994, and knee  surgery with a fatty tumor removed x2 from his back.   SOCIAL HISTORY:  She resides in [place] with her husband.  Her  husband has had a heart transplant.  She is employed with [employer] as a  Diplomatic Services operational officer.  She does not smoke.  She may have a glass of wine 1x per  year.  Denies any drugs.  She does use herbal medications as listed.  She may walk 2 miles within 40 minutes three times a week without  difficulty.   FAMILY HISTORY:  Mother is deceased at the age of 68 secondary to old  age problems and history of GI bleed.  Her father died at the age of 68  with a myocardial infarction.  She has three sisters; their history  includes:  Obesity, hypertension, glaucoma (two) thyroid problems (1).   REVIEW OF SYSTEMS:  In addition to above is notable for occasional  headache in the last week, chronic sinus problems, glasses, a scalp  cyst, chronic dyspnea on exertion which has not changed, postmenopausal,  back arthralgias, GERD.  All other  systems are negative.   PHYSICAL EXAM:  GENERAL:  A well-nourished, well-developed pleasant,  though slightly obese white female in no apparent distress.  Husband is  present.  VITAL SIGNS:  Temperature 97, blood pressure initially is 120/88 now  116/60.  Pulse was initially 148, now 105. Respirations 28 and 97%  saturating on room air.  HEENT:  Unremarkable except for multiple dental caps.  NECK:  Supple without thyromegaly, adenopathy, JVD or carotid bruits.  CHEST:  Symmetrical excursion.  LUNGS: Clear to auscultation.  HEART:  A rapid, irregularly, irregular rhythm with a variable S1.  I  did not appreciate any murmurs, rubs, clicks, or gallops.  All pulses  are symmetrical and intact without any abdominal or femoral bruits.  SKIN:  Integument is intact.  ABDOMEN:  Rounded.  Bowel sounds present without organomegaly, masses,  or tenderness.  EXTREMITIES:  Negative clubbing, cyanosis, or edema.  Peripheral pulses  were symmetrical and intact.  MUSCULOSKELETAL AND NEUROLOGIC:  Unremarkable.    Chest x-ray pending at the time of this dictation.  I have ordered a PA  and lateral.   Shows atrial fibrillation with normal axis, ventricular rate of 150.  There are a lot of baseline artifacts, significant change from Oct 22, 2004.   Admission H&H 13.0 and 38.2, normal indices, platelets 195, WBCs 10.8; i-  STAT showed a sodium of 143, potassium 3.8, BUN 24, creatinine 0.9,  glucose 110.  Point of care marker was negative x1.   IMPRESSION:  1. Atrial fibrillation with rapid ventricular rate, probable new onset      last night.  2. Hypertension.  3. Chest discomfort associated with #1 with negative Myoviews in the      remote past and catheterization in February 2002.  4. Anxiety history as noted per past medical history.   DISPOSITION:  We will admit, place her on IV heparin; and we will  increase her prednisone up to 15, given that her rate is still in the  low 100s.  We will  check her thyroid, continue her aspirin and beta  blocker, and rule out myocardial infarction.  Hopefully with slowing of  her ventricular rate, she will spontaneously convert to normal sinus  rhythm.  Otherwise after midnight, we will keep her n.p.o. for possible  DC cardioversion in the morning.  Her [place]  score is 1, thus, at this  time she will probably not need long-term anticoagulation with Coumadin.  If she spontaneously converts, we will discontinue the IV Cardizem, and  increase her Toprol XL.  She should begin a blood pressure diary at home  to assure that her hypertension is controlled.  We will check an  echocardiogram to review her LV function and structure.      Joellyn Rued, PA-C      Veverly Fells. Excell Seltzer, MD  Electronically Signed    EW/MEDQ  D:  12/21/2006  T:  12/21/2006  Job:  [id number]   cc:   Hilda Lias, M.D.  Dr. Ursula Beath R. Manus Gunning, M.D.  [name]. [name], MD, [facility]

## 2010-10-23 ENCOUNTER — Ambulatory Visit (INDEPENDENT_AMBULATORY_CARE_PROVIDER_SITE_OTHER): Payer: Medicare Other | Admitting: *Deleted

## 2010-10-23 DIAGNOSIS — I4891 Unspecified atrial fibrillation: Secondary | ICD-10-CM

## 2010-10-23 LAB — POCT INR: INR: 2.5

## 2010-10-23 NOTE — Assessment & Plan Note (Signed)
[facility]                            CARDIOLOGY OFFICE NOTE   NAME:[name] [name]                    MRN:          [id number]  DATE:07/07/2006                            DOB:          [birth year]    [name] returns today for followup.  She is doing extremely well.  She  is looking forward to a trip to [place].  She also has a wedding in [place] to go to.   The patient had previous atypical chest pain with nonischemic Myoviews.   When I last saw her, I felt a lot of her pain was cervical in nature.  It seems to have improved.  She has had some atypical right-sided chest  pain after a mammogram which, again, I think is musculoskeletal.  Her  blood pressure has been better controlled.   She has increased her Toprol to 50 mg daily, and this seems to have done  the trick.  She is taking a baby aspirin a day for vascular prophylaxis.   REVIEW OF SYSTEMS:  Remarkable for no significant exertional chest pain,  PND, orthopnea.  She has been active, walking at least half the days of  the month.   MEDICATIONS:  1. Aspirin 1 daily.  2. Eye drops as Zegerid 40 mg daily.  3. Vitamins.  4. Glucosamine.  5. Fish oil.  6. Red yeast.  7. Toprol 50 daily.   PHYSICAL EXAMINATION:  VITAL SIGNS:  Blood pressure of 122/70, pulse 70  and regular.  HEENT:  Normal.  Carotids normal.  LUNGS:  Clear.  HEART:  S1, S2 normal heart sounds.  ABDOMEN:  Benign.  LOWER EXTREMITIES:  Intact pulse, no edema.   IMPRESSION:  Previous atypical chest pain with nonischemic Myoview,  currently doing well, low risk for coronary artery disease.   The patient's blood pressure is better controlled.  We will continue her  Toprol at 50 mg daily. She will continue to take an aspirin daily for  stroke prophylaxis and general vascular well-being.   I will see her back in about 6 months.  So long as she does not have  recurrent chest pain, I would not do a followup Myoview for  3-4 years.     [name]. [name], MD, [facility]  Electronically Signed    PCN/MedQ  DD: 07/07/2006  DT: 07/07/2006  Job #: [id number]

## 2010-10-23 NOTE — Cardiovascular Report (Signed)
[place]. [facility]  Patient:    [name], [name]                  MRN: [id number] Proc. Date: 07/08/00 Adm. Date:  [id number] Disc. Date: [id number] Attending:  Junious Silk CC:         Dyanne Carrel, M.D.  [name]. [name], M.D. [facility]   Cardiac Catheterization  DATE OF BIRTH:  02/27/1941  PROCEDURE:  Left heart catheterization/coronary arteriography.  CARDIOLOGIST:  Rollene Rotunda, M.D. [facility]  INDICATION:  Evaluate patient with chest pain (786.51).  PROCEDURAL NOTE:  Left heart catheterization was performed via the right femoral artery.  The artery was cannulated using anterior wall puncture.  A #6 French arterial sheath was inserted via the modified Seldinger technique. Preformed Judkins and a pigtail catheter were utilized.  The patient tolerated the procedure well and left the lab in stable condition.  RESULTS:  HEMODYNAMICS:  LV: 146/15, AO: 147/71.  CORONARIES:  left main was normal.  The LAD had luminal irregularities.  The circumflex was normal.  The right coronary artery was a dominant vessel and had luminal irregularities.  LEFT VENTRICULOGRAM:  A left ventriculogram was obtained in the RAO projection.  The EF was 65% with normal wall motion.  CONCLUSION:  Minimal coronary artery plaquing.  Normal left ventricular function.  PLAN:  No further cardiovascular testing is planned.  The patient will continue to follow up with Dr. Manus Gunning if she has continued chest discomfort. DD:  07/08/00 TD:  07/10/00 Job: [id number]

## 2010-10-23 NOTE — Discharge Summary (Signed)
[place]. [facility]  Patient:    [name], [name]                    MRN: [id number] Adm. Date:  07/07/00 Disc. Date: 07/08/00 Attending:  Madolyn Frieze. Jens Som, M.D. [facility] Dictator:   Rozell Searing, P.A. CC:         Dyanne Carrel, M.D.                  Referring Physician Discharge [place]  PROCEDURES:  Cardiac catheterization on July 08, 2000.  REASON FOR ADMISSION:  Ms. Gallego is a 69 year old female, a patient of [name] C. [name], M.D., with a history of recurrent chest pain with negative previous stress Cardiolites.  Her most recent stress Cardiolite was this past October.  She presented to the [facility] Emergency Room with a two-week history of worsening exertional dyspnea culminating in an episode of early morning left upper extremity/shoulder discomfort and burning.  She reported radiation to the left anterior chest region, but no associated diaphoresis, shortness of breath, or nausea/vomiting.  A blood pressure taken at home revealed systolic greater than 200.  She presented via EMS who did record a blood pressure of 202/102. The patient was treated with baby aspirin and a total of three sublingual nitroglycerin tablets en route.  She reported no further relief upon arrival; no acute EKG changes were noted.  She was admitted for rule out of MI and further diagnostic evaluation.  LABORATORY DATA:  Cardiac enzymes:  CPK-MB and troponin I negative x 2. Metabolic profile normal.  CBC normal.  TSH 1.114.  Admission CXR:  Normal.  HOSPITAL COURSE:  The patient ruled out for MI with negative serial cardiac enzymes.  Given her history of recurrent chest discomfort and recent worsening exertional dyspnea, the recommendation was to proceed with definitive diagnosis by way of coronary angiography.  Cardiac catheterization performed on July 08, 2000, by Rollene Rotunda, M.D., (see report for full details) revealed luminal irregularities of the  LAD and RCA arteries with otherwise normal CFX and normal left ventricle.  Rollene Rotunda, M.D., recommended no further cardiac work-up and the patient was cleared for discharge later that same day.  Given the patients complaint of exertional dyspnea and fatigue, which she attributes to the Toprol, a decision was made at the time of discharge to decrease this to 25 mg q.d.  Of note, she had been placed on this medication by [name] C. [name], M.D., for her history of palpitations.  She states that since being on this she has had no further palpitations.  Thus, the plan is to see how he does on this reduced dose and if she has recurrent palpitations, to either increase this medication once again and add something else for her hypertension, as well as proceed with outpatient event monitoring.  The patient states that she has not been placed on this to day.  In the event that her symptoms may be attributed to GI etiology, the patient was maintained on Protonix at discharge.  MEDICATIONS AT DISCHARGE: 1. Aspirin 81 mg q.d. 2. Toprol XL 25 mg q.d. 3. Premarin 0.625 mg q.d. 4. Protonix 40 mg q.d.  ACTIVITY:  Refrain from any strenuous activity/driving x 2 days.  DIET:  Maintain low-fat/cholesterol diet.  SPECIAL INSTRUCTIONS:  Call the office if there is any swelling/bleeding of the groin.  FOLLOW-UP:  The patient is scheduled to follow up with Gene Serpe, P.A., on August 02, 2000, at 12 p.m.  DISCHARGE  DIAGNOSES: 1. Noncardiac chest pain.    a. Normal coronary arteries/left ventricle by cardiac catheterization on       July 08, 2000. 2. History of palpitations. 3. Hypertension. 4. History of hypercholesterolemia. DD:  07/08/00 TD:  07/08/00 Job: [id number]

## 2010-10-23 NOTE — Assessment & Plan Note (Signed)
[facility]                               PULMONARY OFFICE NOTE   NAME:[name], [name]                    MRN:          [id number]  DATE:01/31/2006                            DOB:          [birth year]    FOLLOWUP PULMONOLOGY VISIT:  PROBLEM #1.  Allergic rhinitis.  PROBLEM #2:  Coronary artery disease.  PROBLEM #3:  Degenerative disk disease.  PROBLEM #4:  Glaucoma.   HISTORY:  She comes for one-year followup saying she has had a minor episode  of bronchitis treated with antibiotics but generally has done quite well  through the year.  She is being extra careful about exposures because her  husband had a heart transplant, and this probably works to her advantage as  well, in that she is very careful to avoid colds.  She continues giving her  own allergy vaccine at 1:10.  We spent time discussing this today, including  our discussion of the policy concerning the administration outside of a  medical office, analysis, epinephrine and choices.  She wishes to continue  giving her own vaccine, reviewed our waiver and was given a fresh Epipen  prescription.   MEDICATIONS:  1. Aspirin 81 mg.  2. Toprol-XL 25 mg.  3. Menostar 3.25 mg patch.  4. Xalatan drops.  5. Multivitamins.  6. Calcium.  7. Allergy vaccine at 1:10.  8. Zegerid.  9. Epipen.   ALLERGIES TO MEDICATIONS:  None.   OBJECTIVE:  VITAL SIGNS: Weight 183 pounds, blood pressure 148/78, pulse  regular, 70.  Room air saturation 99%.  GENERAL:  She looks quite comfortable.  HEENT:  Normal mucosa is normal.  Conjunctivae are not injected.  Pharynx is  clear.  LUNGS:  Lungs are clear to percussion and auscultation.  Voice quality  normal.  Work of breathing unlabored.  HEART:  Heart sounds regular with no murmur or gallop.   IMPRESSION:  Good control of allergic rhinitis.  No asthma.  No significant  exposures.  Her other medical problems are as noted above.   PLAN:  1. Educational  discussion and Epipen refill as above.  She will continue      allergy vaccine.  2. Schedule one-year followup, earlier p.r.n.                                   Clinton D. Maple Hudson, MD, FCCP, FACP   CDY/MedQ  DD:  02/02/2006  DT:  02/03/2006  Job #:  [id number]   cc:   [name]. [name] Gunning, MD

## 2010-10-23 NOTE — Assessment & Plan Note (Signed)
[facility]                            CARDIOLOGY OFFICE NOTE   NAME:[name], [name]                    MRN:          [id number]  DATE:05/26/2006                            DOB:          06/20/1940    [name] returns today for followup.  I have seen her in the past  for atypical chest pain and hypertension.  She has had a little bit of  atypical chest pain, but it actually sounds more like cervical neck  pain.  Her blood pressure has been labile.  She describes difficulty  sleeping at night and a lot of times, when she wakes up, her systolics  are in excess of 180.   She was on Toprol 25 mg a day.  I talked to her and told her, for the  time being, we would increase it to 50 mg a day, and I would reassess  her in 4 to 5 weeks.  She is not taking excess nonsteroidals, drinking  excessively, and she has room to work in terms of decreasing her salt  intake.   MEDICATIONS:  Only include an aspirin a day and Toprol 25 a day.   She is also on Zegerid 40 mg a day, glucosamine, Allegra, and fish oil.   EXAM:  Blood pressure is 148/88, pulse 71 and regular.  HEENT:  Normal.  There is no thyromegaly or lymphadenopathy.  There is no carotid bruit.  LUNGS:  Clear.  There is sodium S1, S2 without S4 gallop.  ABDOMEN:  Benign.  There is no renal bruit.  Distal pulses are intact with no edema.  NEURO:  Nonfocal.  SKIN:  Warm and dry.   EKG:  Normal with no LVH.   IMPRESSION:  Labile hypertension.  Increase Toprol to 50 mg a day,  decrease salt in diet, followup with me in 4 to 6 weeks.   Interestingly, I spoke with [name]'s husband, [name], today.  He is a  long-term patient of Dr. Glennon Hamilton, who had a heart transplant at the  [facility] and is doing remarkably well.   In regards to [name]'s condition, I think her pain in the neck is more  cervical radiculopathy.  She will take Tylenol for this an I will see  her back to assess her blood  pressure on the higher dose of Toprol.     [name]. [name], MD, [facility]  Electronically Signed    PCN/MedQ  DD: 05/26/2006  DT: 05/26/2006  Job #: [id number]

## 2010-11-03 ENCOUNTER — Telehealth: Payer: Self-pay | Admitting: Cardiovascular Disease

## 2010-11-03 NOTE — Telephone Encounter (Signed)
Pt states her meds/lasix are not helping with her swelling. Pt wants to talk a nurse.

## 2010-11-03 NOTE — Telephone Encounter (Signed)
Stop lasix and go back on diazide.  Edema not cardiac normal EF

## 2010-11-03 NOTE — Telephone Encounter (Signed)
I spoke with the patient. She states that since she started taking the furosemide 20mg  once daily at the beginning of the month, she feels like her swelling may be a little worse. She has occasional bouts of feeling a little SOB. She does track her weight and thinks she has gained about 1-2 pounds since she was here. She has an echo scheduled for 12/14/10 and lower venous dopplers on 7/2. On 07/15/10 BUN- 7/ creat- 0.92. I have advised her to take an extra lasix today to see if this helps with her swelling at all and to weigh in the morning. I will forward this message to Dr. [name] for review and ask his nurse to call her back tomorrow with any recommendations. She is agreeable.

## 2010-11-04 NOTE — Telephone Encounter (Signed)
PT AWARE TO RESTART DYAZIDE AND STOP FUROSEMIDE./CY

## 2010-11-20 ENCOUNTER — Ambulatory Visit (INDEPENDENT_AMBULATORY_CARE_PROVIDER_SITE_OTHER): Payer: Medicare Other | Admitting: *Deleted

## 2010-11-20 DIAGNOSIS — I4891 Unspecified atrial fibrillation: Secondary | ICD-10-CM

## 2010-11-20 LAB — POCT INR: INR: 2.5

## 2010-12-07 ENCOUNTER — Encounter (INDEPENDENT_AMBULATORY_CARE_PROVIDER_SITE_OTHER): Payer: Medicare Other | Admitting: Cardiology

## 2010-12-07 DIAGNOSIS — R609 Edema, unspecified: Secondary | ICD-10-CM

## 2010-12-11 ENCOUNTER — Encounter: Payer: Self-pay | Admitting: Cardiovascular Disease

## 2010-12-11 ENCOUNTER — Telehealth: Payer: Self-pay | Admitting: Internal Medicine

## 2010-12-11 MED ORDER — AZITHROMYCIN 250 MG PO TABS: ORAL_TABLET | ORAL | Status: AC

## 2010-12-11 NOTE — Telephone Encounter (Signed)
Suspect air pollution, but can try Z pak.

## 2010-12-11 NOTE — Telephone Encounter (Signed)
Called, spoke with pt.  She is aware of CDY's recs and aware z pak sent to [facility].  She verbalized understanding of these instructions.

## 2010-12-11 NOTE — Telephone Encounter (Signed)
Called, spoke with pt.  She c/o runny nose, scratchy throat, prod cough with dark yellow mucus, and chest congestion x 2-3 days.  Denies increased SOB, wheezing, chest tightness, f/c/s.  Taking allegra qd, robitussion and tylenol prn with some relief.  Requesting CDY's recs.  nkda - verified.  [facility].  CDY, pls advise.  Thanks!

## 2010-12-14 ENCOUNTER — Other Ambulatory Visit (INDEPENDENT_AMBULATORY_CARE_PROVIDER_SITE_OTHER): Payer: Medicare Other | Admitting: *Deleted

## 2010-12-14 ENCOUNTER — Ambulatory Visit (HOSPITAL_COMMUNITY): Payer: Medicare Other | Attending: Cardiovascular Disease | Admitting: Radiology

## 2010-12-14 ENCOUNTER — Ambulatory Visit (INDEPENDENT_AMBULATORY_CARE_PROVIDER_SITE_OTHER): Payer: Medicare Other | Admitting: Cardiovascular Disease

## 2010-12-14 ENCOUNTER — Other Ambulatory Visit (HOSPITAL_COMMUNITY): Payer: Self-pay | Admitting: Cardiovascular Disease

## 2010-12-14 ENCOUNTER — Encounter: Payer: Self-pay | Admitting: Cardiovascular Disease

## 2010-12-14 DIAGNOSIS — R609 Edema, unspecified: Secondary | ICD-10-CM

## 2010-12-14 DIAGNOSIS — I079 Rheumatic tricuspid valve disease, unspecified: Secondary | ICD-10-CM | POA: Insufficient documentation

## 2010-12-14 DIAGNOSIS — R0602 Shortness of breath: Secondary | ICD-10-CM

## 2010-12-14 DIAGNOSIS — M1990 Unspecified osteoarthritis, unspecified site: Secondary | ICD-10-CM

## 2010-12-14 DIAGNOSIS — I4891 Unspecified atrial fibrillation: Secondary | ICD-10-CM

## 2010-12-14 DIAGNOSIS — E785 Hyperlipidemia, unspecified: Secondary | ICD-10-CM | POA: Insufficient documentation

## 2010-12-14 DIAGNOSIS — E669 Obesity, unspecified: Secondary | ICD-10-CM | POA: Insufficient documentation

## 2010-12-14 DIAGNOSIS — G4730 Sleep apnea, unspecified: Secondary | ICD-10-CM

## 2010-12-14 LAB — BASIC METABOLIC PANEL
BUN: 19 mg/dL (ref 6–23)
CO2: 28 mEq/L (ref 19–32)
Calcium: 9.5 mg/dL (ref 8.4–10.5)
Chloride: 102 mEq/L (ref 96–112)
Creatinine, Ser: 1.1 mg/dL (ref 0.4–1.2)
GFR: 54.79 mL/min — ABNORMAL LOW (ref >60.00)
Glucose, Bld: 129 mg/dL — ABNORMAL HIGH (ref 70–99)
Potassium: 3.8 mEq/L (ref 3.5–5.1)
Sodium: 142 mEq/L (ref 135–145)

## 2010-12-14 LAB — SEDIMENTATION RATE: Sed Rate: 26 mm/hr — ABNORMAL HIGH (ref 0–22)

## 2010-12-14 LAB — BRAIN NATRIURETIC PEPTIDE: Pro B Natriuretic peptide (BNP): 69 pg/mL (ref 0.0–100.0)

## 2010-12-14 NOTE — Assessment & Plan Note (Signed)
Maint NSR with no palpitations  

## 2010-12-14 NOTE — Assessment & Plan Note (Signed)
LFT;s normal on recent lab work by Dr Manus Gunning.  LDL under 100

## 2010-12-14 NOTE — Progress Notes (Signed)
[name] is a 70 yo female with a history of paroxysmal atrial fibrillation on chronic Coumadin therapy. She has a history of chest discomfort. She had heart catheterization in Feb. 2002 that demonstrated normal coronary arteries and normal LV function. A Myoview study done in July 2009 demonstrated no ischemia. Her last echocardiogram done in July 2008 demonstrated an ejection fraction of 60%, mild LVH, mild LAE. 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 She's had multiple back surgeries. Finally out of her back brace. Has gained 11 lbs over the last 3 months and feels swelling in her arms and legs. Seen by Dr Richardo Priest but diuretic not changed. Tries to watch salt in her diet  Reviewed notes from [facility]:  Labs ok with BNP 130 normal Cr and normal Albumin.   Reviewed echo from today.  Normal RV/LV function no significant valve disease Reviewed LE venous duplex 7/2 no DVT or venous insuficiency  Symptoms a bit better off cardizem.  Has myalgias and arthrtic pain in joints especially knees.  ? Fibromyalgia.    ROS: Denies fever, malais, weight loss, blurry vision, decreased visual acuity, cough, sputum, SOB, hemoptysis, pleuritic pain, palpitaitons, heartburn, abdominal pain, melena, lower extremity edema, claudication, or rash.  All other systems reviewed and negative  General: Affect appropriate Healthy:  appears stated age HEENT: normal Neck supple with no adenopathy JVP normal no bruits no thyromegaly Lungs clear with no wheezing and good diaphragmatic motion Heart:  S1/S2 no murmur,rub, gallop or click PMI normal Abdomen: benighn, BS positve, no tenderness, no AAA no bruit.  No HSM or HJR Distal pulses intact with no bruits TRace bilateral LE  edema Neuro non-focal Skin warm and dry spider veins in LE;s No muscular weakness   Current Outpatient  Prescriptions  Medication Sig Dispense Refill  . Ascorbic Acid (VITAMIN C) 500 MG tablet Take 500 mg by mouth daily.        Marland Kitchen azithromycin (ZITHROMAX) 250 MG tablet Take 2 tablets (500 mg) on  Day 1,  followed by 1 tablet (250 mg) once daily on Days 2 through 5.  6 each  0  . bimatoprost (LUMIGAN) 0.03 % ophthalmic drops Place 1 drop into both eyes at bedtime.        Marland Kitchen CALCIUM-MAGNESIUM-ZINC PO Take 1 tablet by mouth daily.        . Coenzyme Q10 (COQ10) 100 MG CAPS Take 100 mg by mouth daily.        Marland Kitchen diltiazem (CARDIZEM CD) 240 MG 24 hr capsule Take 240 mg by mouth daily.        Marland Kitchen docusate sodium (COLACE) 100 MG capsule Take 100 mg by mouth 2 (two) times daily.        . fexofenadine (ALLEGRA) 180 MG tablet Take 180 mg by mouth as needed.       . Flaxseed, Linseed, 1200 MG CAPS Take 1,200 mg by mouth daily.        Marland Kitchen gabapentin (NEURONTIN) 300 MG capsule Take 1 tablet by mouth 2 (two) times daily.       Marland Kitchen GLUCOSAMINE CHONDROITIN COMPLX PO Take 1 tablet by mouth daily. 1500/1200      . metoprolol (TOPROL-XL) 100 MG 24 hr tablet Take 100 mg by mouth daily.        . Multiple Vitamin (MULTIVITAMIN) tablet Take 1 tablet by mouth daily.        Marland Kitchen  Omega-3 Fatty Acids (FISH OIL) 1200 MG CAPS Take 1,200 mg by mouth daily.        Marland Kitchen omeprazole (PRILOSEC) 20 MG capsule Take 20 mg by mouth daily.        . pramipexole (MIRAPEX) 0.5 MG tablet Take 0.5 mg by mouth at bedtime as needed.        . pravastatin (PRAVACHOL) 40 MG tablet Take 40 mg by mouth daily.        Marland Kitchen senna (SENOKOT) 8.6 MG tablet Take 1 tablet by mouth at bedtime.        . triamterene-hydrochlorothiazide (DYAZIDE) 37.5-25 MG per capsule Take 1 capsule by mouth daily.        Marland Kitchen warfarin (COUMADIN) 5 MG tablet Take by mouth as directed.          Allergies  Review of patient's allergies indicates no known allergies.  Electrocardiogram:  Assessment and Plan

## 2010-12-14 NOTE — Assessment & Plan Note (Signed)
Recheck BMET and BNP on diazide.  Check ESR.  No cardiac cause to edema noted.  Stop all supplements.  Calcium blocker stopped.  Limit salt.  F/U 3 months to reevaluate.

## 2010-12-14 NOTE — Patient Instructions (Signed)
Your physician recommends that you schedule a follow-up appointment in: 3 months.  

## 2010-12-18 ENCOUNTER — Ambulatory Visit (INDEPENDENT_AMBULATORY_CARE_PROVIDER_SITE_OTHER): Payer: Medicare Other | Admitting: *Deleted

## 2010-12-18 DIAGNOSIS — I4891 Unspecified atrial fibrillation: Secondary | ICD-10-CM

## 2010-12-18 LAB — POCT INR: INR: 2.4

## 2010-12-31 ENCOUNTER — Ambulatory Visit (INDEPENDENT_AMBULATORY_CARE_PROVIDER_SITE_OTHER): Payer: Medicare Other

## 2010-12-31 DIAGNOSIS — J309 Allergic rhinitis, unspecified: Secondary | ICD-10-CM

## 2011-01-15 ENCOUNTER — Ambulatory Visit (INDEPENDENT_AMBULATORY_CARE_PROVIDER_SITE_OTHER): Payer: Medicare Other | Admitting: *Deleted

## 2011-01-15 DIAGNOSIS — I4891 Unspecified atrial fibrillation: Secondary | ICD-10-CM

## 2011-01-15 LAB — POCT INR: INR: 3

## 2011-01-21 ENCOUNTER — Other Ambulatory Visit: Payer: Self-pay | Admitting: Cardiovascular Disease

## 2011-01-21 NOTE — Telephone Encounter (Signed)
Coumadin refill request 

## 2011-02-12 ENCOUNTER — Ambulatory Visit (INDEPENDENT_AMBULATORY_CARE_PROVIDER_SITE_OTHER): Payer: Medicare Other | Admitting: *Deleted

## 2011-02-12 DIAGNOSIS — I4891 Unspecified atrial fibrillation: Secondary | ICD-10-CM

## 2011-02-12 LAB — POCT INR: INR: 3.1

## 2011-03-16 ENCOUNTER — Encounter: Payer: Self-pay | Admitting: Cardiovascular Disease

## 2011-03-16 ENCOUNTER — Ambulatory Visit (INDEPENDENT_AMBULATORY_CARE_PROVIDER_SITE_OTHER): Payer: Medicare Other | Admitting: Cardiovascular Disease

## 2011-03-16 ENCOUNTER — Ambulatory Visit (INDEPENDENT_AMBULATORY_CARE_PROVIDER_SITE_OTHER): Payer: Medicare Other | Admitting: *Deleted

## 2011-03-16 DIAGNOSIS — R609 Edema, unspecified: Secondary | ICD-10-CM

## 2011-03-16 DIAGNOSIS — I4891 Unspecified atrial fibrillation: Secondary | ICD-10-CM

## 2011-03-16 DIAGNOSIS — E785 Hyperlipidemia, unspecified: Secondary | ICD-10-CM

## 2011-03-16 LAB — POCT INR: INR: 3

## 2011-03-16 NOTE — Progress Notes (Signed)
[name] is a 70 yo female with a history of paroxysmal atrial fibrillation on chronic Coumadin therapy. She has a history of chest discomfort. She had heart catheterization in Feb. 2002 that demonstrated normal coronary arteries and normal LV function. A Myoview study done in July 2009 demonstrated no ischemia. Her last echocardiogram done in July 2008 demonstrated an ejection fraction of 60%, mild LVH, mild LAE. 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 She's had multiple back surgeries. Finally out of her back brace. Has gained 11 lbs over the last 3 months and feels swelling in her arms and legs. Seen by Dr Richardo Priest but diuretic not changed. Tries to watch salt in her diet  Reviewed notes from [facility]: Labs ok with BNP 130 normal Cr and normal Albumin.   Reviewed echo from 7/11. Normal RV/LV function no significant valve disease  Reviewed LE venous duplex 12/06/09  no DVT or venous insuficiency   Symptoms a bit better off cardizem. Has myalgias and arthrtic pain in joints especially knees. ? Fibromyalgia.   Recovering from Chicken Pox of all things.    ROS: Denies fever, malais, weight loss, blurry vision, decreased visual acuity, cough, sputum, SOB, hemoptysis, pleuritic pain, palpitaitons, heartburn, abdominal pain, melena, lower extremity edema, claudication, or rash.  All other systems reviewed and negative  General: Affect appropriate Healthy:  appears stated age HEENT: normal Neck supple with no adenopathy JVP normal no bruits no thyromegaly Lungs clear with no wheezing and good diaphragmatic motion Heart:  S1/S2 no murmur,rub, gallop or click PMI normal Abdomen: benighn, BS positve, no tenderness, no AAA no bruit.  No HSM or HJR Distal pulses intact with no bruits No edema Neuro non-focal Skin warm and dry healing Pox marks on arms No muscular  weakness   Current Outpatient Prescriptions  Medication Sig Dispense Refill  . bimatoprost (LUMIGAN) 0.03 % ophthalmic drops Place 1 drop into both eyes at bedtime.        . docusate sodium (COLACE) 100 MG capsule Take 100 mg by mouth 2 (two) times daily.        . fexofenadine (ALLEGRA) 180 MG tablet Take 180 mg by mouth as needed.       . gabapentin (NEURONTIN) 300 MG capsule Take 1 tablet by mouth at bedtime.       . metoprolol (TOPROL-XL) 100 MG 24 hr tablet Take 100 mg by mouth daily.        Marland Kitchen omeprazole (PRILOSEC) 20 MG capsule Take 20 mg by mouth daily.        . pramipexole (MIRAPEX) 0.5 MG tablet Take 0.5 mg by mouth at bedtime as needed.        . pravastatin (PRAVACHOL) 40 MG tablet Take 40 mg by mouth daily.        Marland Kitchen triamterene-hydrochlorothiazide (DYAZIDE) 37.5-25 MG per capsule Take 1 capsule by mouth daily.        Marland Kitchen warfarin (COUMADIN) 5 MG tablet Take as directed by Anticoagulation clinic   90 tablet  1    Allergies  Review of patient's allergies indicates no known allergies.  Electrocardiogram:  Assessment and Plan

## 2011-03-16 NOTE — Assessment & Plan Note (Signed)
Dependant and benign ? Worse on calcium blocker.  No DVT on [place] and normal RV/LV function on echo

## 2011-03-16 NOTE — Patient Instructions (Signed)
Your physician wants you to follow-up in: 6 MONTHS You will receive a reminder letter in the mail two months in advance. If you don't receive a letter, please call our office to schedule the follow-up appointment. 

## 2011-03-16 NOTE — Assessment & Plan Note (Signed)
Cholesterol is at goal.  Continue current dose of statin and diet Rx.  No myalgias or side effects.  F/U  LFT's in 6 months. No results found for this basename: LDLCALC             

## 2011-03-16 NOTE — Assessment & Plan Note (Signed)
Maint NSR  Continue current Rx

## 2011-03-17 ENCOUNTER — Emergency Department (HOSPITAL_COMMUNITY)
Admission: EM | Admit: 2011-03-17 | Discharge: 2011-03-17 | Disposition: A | Discharge status: Home / Self Care | Payer: Medicare Other | Attending: Emergency Medicine | Admitting: Emergency Medicine

## 2011-03-17 ENCOUNTER — Telehealth: Payer: Self-pay | Admitting: Cardiovascular Disease

## 2011-03-17 ENCOUNTER — Emergency Department (HOSPITAL_COMMUNITY): Payer: Medicare Other

## 2011-03-17 DIAGNOSIS — K219 Gastro-esophageal reflux disease without esophagitis: Secondary | ICD-10-CM | POA: Insufficient documentation

## 2011-03-17 DIAGNOSIS — Z7901 Long term (current) use of anticoagulants: Secondary | ICD-10-CM | POA: Insufficient documentation

## 2011-03-17 DIAGNOSIS — R002 Palpitations: Secondary | ICD-10-CM | POA: Insufficient documentation

## 2011-03-17 DIAGNOSIS — E7800 Pure hypercholesterolemia, unspecified: Secondary | ICD-10-CM | POA: Insufficient documentation

## 2011-03-17 DIAGNOSIS — R000 Tachycardia, unspecified: Secondary | ICD-10-CM | POA: Insufficient documentation

## 2011-03-17 DIAGNOSIS — I10 Essential (primary) hypertension: Secondary | ICD-10-CM | POA: Insufficient documentation

## 2011-03-17 DIAGNOSIS — H409 Unspecified glaucoma: Secondary | ICD-10-CM | POA: Insufficient documentation

## 2011-03-17 DIAGNOSIS — Z79899 Other long term (current) drug therapy: Secondary | ICD-10-CM | POA: Insufficient documentation

## 2011-03-17 DIAGNOSIS — R079 Chest pain, unspecified: Secondary | ICD-10-CM | POA: Insufficient documentation

## 2011-03-17 DIAGNOSIS — I4891 Unspecified atrial fibrillation: Secondary | ICD-10-CM | POA: Insufficient documentation

## 2011-03-17 LAB — DIFFERENTIAL
Band Neutrophils: 0 % (ref 0–10)
Basophils Absolute: 0.1 10*3/uL (ref 0.0–0.1)
Basophils Relative: 1 % (ref 0–1)
Blasts: 0 %
Eosinophils Absolute: 0.2 10*3/uL (ref 0.0–0.7)
Eosinophils Relative: 4 % (ref 0–5)
Lymphocytes Relative: 23 % (ref 12–46)
Lymphs Abs: 1.2 10*3/uL (ref 0.7–4.0)
Metamyelocytes Relative: 0 %
Monocytes Absolute: 0.6 10*3/uL (ref 0.1–1.0)
Monocytes Relative: 12 % (ref 3–12)
Myelocytes: 0 %
Neutro Abs: 3.3 10*3/uL (ref 1.7–7.7)
Neutrophils Relative %: 60 % (ref 43–77)
Promyelocytes Absolute: 0 %
nRBC: 0 /100 WBC

## 2011-03-17 LAB — POCT I-STAT, CHEM 8
BUN: 19 mg/dL (ref 6–23)
Calcium, Ion: 1.23 mmol/L (ref 1.12–1.32)
Chloride: 106 mEq/L (ref 96–112)
Creatinine, Ser: 0.9 mg/dL (ref 0.50–1.10)
Glucose, Bld: 102 mg/dL — ABNORMAL HIGH (ref 70–99)
HCT: 40 % (ref 36.0–46.0)
Hemoglobin: 13.6 g/dL (ref 12.0–15.0)
Potassium: 4.1 mEq/L (ref 3.5–5.1)
Sodium: 142 mEq/L (ref 135–145)
TCO2: 26 mmol/L (ref 0–100)

## 2011-03-17 LAB — CBC
HCT: 40 % (ref 36.0–46.0)
Hemoglobin: 13.1 g/dL (ref 12.0–15.0)
MCH: 27.6 pg (ref 26.0–34.0)
MCHC: 32.8 g/dL (ref 30.0–36.0)
MCV: 84.4 fL (ref 78.0–100.0)
Platelets: 160 10*3/uL (ref 150–400)
RBC: 4.74 MIL/uL (ref 3.87–5.11)
RDW: 15.4 % (ref 11.5–15.5)
WBC: 5.4 10*3/uL (ref 4.0–10.5)

## 2011-03-17 LAB — POCT I-STAT TROPONIN I: Troponin i, poc: 0 ng/mL (ref 0.00–0.08)

## 2011-03-17 NOTE — Telephone Encounter (Signed)
Spoke with pt, she woke in atrial fib and states she felt bad. She converted before she got to the hosp. She will try taking an extra toprol when it happens and give it a little time to see if she converts before going to the hosp. She feels fine today just tired from being up in the night . she will call back with problems [name]

## 2011-03-17 NOTE — Telephone Encounter (Signed)
Pt said she went into to afib in the night. She went to er and is back home. Is there anything she can do differently about med

## 2011-03-22 LAB — PROTIME-INR
INR: 1.1
INR: 1.1
INR: 1.6 — ABNORMAL HIGH
INR: 2.5 — ABNORMAL HIGH
Prothrombin Time: 14.1
Prothrombin Time: 14.2
Prothrombin Time: 19.8 — ABNORMAL HIGH
Prothrombin Time: 28.7 — ABNORMAL HIGH

## 2011-03-22 LAB — LIPID PANEL
Cholesterol: 183
HDL: 47
LDL Cholesterol: 101 — ABNORMAL HIGH
Total CHOL/HDL Ratio: 3.9
Triglycerides: 174 — ABNORMAL HIGH
VLDL: 35

## 2011-03-22 LAB — COMPREHENSIVE METABOLIC PANEL
ALT: 30
AST: 25
Albumin: 3.9
Alkaline Phosphatase: 93
BUN: 18
CO2: 26
Calcium: 9.2
Chloride: 111
Creatinine, Ser: 0.9
GFR calc Af Amer: >60
GFR calc non Af Amer: >60
Glucose, Bld: 109 — ABNORMAL HIGH
Potassium: 4.1
Sodium: 145
Total Bilirubin: 0.9
Total Protein: 6.4

## 2011-03-22 LAB — TROPONIN I: Troponin I: 0.05

## 2011-03-22 LAB — CBC
HCT: 35.6 — ABNORMAL LOW
HCT: 36.5
HCT: 37.5
HCT: 38.2
Hemoglobin: 11.9 — ABNORMAL LOW
Hemoglobin: 12.2
Hemoglobin: 12.6
Hemoglobin: 13
MCHC: 33.3
MCHC: 33.5
MCHC: 33.7
MCHC: 34
MCV: 84.8
MCV: 85
MCV: 86.6
MCV: 86.6
Platelets: 147 — ABNORMAL LOW
Platelets: 154
Platelets: 166
Platelets: 195
RBC: 4.11
RBC: 4.21
RBC: 4.42
RBC: 4.49
RDW: 14.7 — ABNORMAL HIGH
RDW: 14.7 — ABNORMAL HIGH
RDW: 14.8 — ABNORMAL HIGH
RDW: 14.9 — ABNORMAL HIGH
WBC: 10.8 — ABNORMAL HIGH
WBC: 4.5
WBC: 5.5
WBC: 5.8

## 2011-03-22 LAB — I-STAT 8, (EC8 V) (CONVERTED LAB)
Acid-base deficit: 1
BUN: 24 — ABNORMAL HIGH
Bicarbonate: 22.6
Chloride: 110
Glucose, Bld: 110 — ABNORMAL HIGH
HCT: 41
Hemoglobin: 13.9
Operator id: 282201
Potassium: 3.8
Sodium: 143
TCO2: 24
pCO2, Ven: 31.9 — ABNORMAL LOW
pH, Ven: 7.457 — ABNORMAL HIGH

## 2011-03-22 LAB — DIFFERENTIAL
Basophils Absolute: 0
Basophils Relative: 0
Eosinophils Absolute: 0
Eosinophils Relative: 0
Lymphocytes Relative: 11 — ABNORMAL LOW
Lymphs Abs: 1.2
Monocytes Absolute: 0.6
Monocytes Relative: 6
Neutro Abs: 8.9 — ABNORMAL HIGH
Neutrophils Relative %: 83 — ABNORMAL HIGH

## 2011-03-22 LAB — POCT I-STAT CREATININE
Creatinine, Ser: 0.9
Operator id: 282201

## 2011-03-22 LAB — CARDIAC PANEL(CRET KIN+CKTOT+MB+TROPI)
CK, MB: 3
CK, MB: 4.7 — ABNORMAL HIGH
Relative Index: 2.4
Relative Index: 2.9 — ABNORMAL HIGH
Total CK: 124
Total CK: 162
Troponin I: 0.07 — ABNORMAL HIGH
Troponin I: 0.18 — ABNORMAL HIGH

## 2011-03-22 LAB — POCT CARDIAC MARKERS
CKMB, poc: 2.9
Myoglobin, poc: 162
Operator id: 282201
Troponin i, poc: <0.05

## 2011-03-22 LAB — APTT
aPTT: 25
aPTT: 41 — ABNORMAL HIGH

## 2011-03-22 LAB — HEPARIN LEVEL (UNFRACTIONATED)
Heparin Unfractionated: 0.51
Heparin Unfractionated: 0.62
Heparin Unfractionated: 1.17 — ABNORMAL HIGH

## 2011-03-22 LAB — TSH: TSH: 1.997

## 2011-03-22 LAB — T4, FREE: Free T4: 1.06

## 2011-03-22 LAB — CK TOTAL AND CKMB (NOT AT ARMC)
CK, MB: 5.5 — ABNORMAL HIGH
Relative Index: 2.6 — ABNORMAL HIGH
Total CK: 210 — ABNORMAL HIGH

## 2011-03-22 LAB — T3 UPTAKE: T3 Uptake Ratio: 36.3

## 2011-03-30 ENCOUNTER — Ambulatory Visit: Payer: Medicare Other | Admitting: Internal Medicine

## 2011-04-13 ENCOUNTER — Ambulatory Visit (INDEPENDENT_AMBULATORY_CARE_PROVIDER_SITE_OTHER): Payer: Medicare Other | Admitting: *Deleted

## 2011-04-13 DIAGNOSIS — I4891 Unspecified atrial fibrillation: Secondary | ICD-10-CM

## 2011-04-13 LAB — POCT INR: INR: 2.5

## 2011-04-16 ENCOUNTER — Encounter: Payer: Self-pay | Admitting: Internal Medicine

## 2011-04-16 ENCOUNTER — Ambulatory Visit (INDEPENDENT_AMBULATORY_CARE_PROVIDER_SITE_OTHER): Payer: Medicare Other | Admitting: Internal Medicine

## 2011-04-16 VITALS — BP 138/86 | HR 85 | Ht 66.0 in | Wt 202.0 lb

## 2011-04-16 DIAGNOSIS — J309 Allergic rhinitis, unspecified: Secondary | ICD-10-CM

## 2011-04-16 DIAGNOSIS — G4730 Sleep apnea, unspecified: Secondary | ICD-10-CM

## 2011-04-16 NOTE — Progress Notes (Signed)
Patient ID: [name], female    DOB: 1940/11/03, 70 y.o.   MRN: [id number]  HPI 74 yoF followed for allergic rhinitis, hx sleep apnea, complicated by AtrialFib.  3 months ago had lumbar spine surgery with rods and screws, complicated by ileitis. Denies respiratory complications.  Mild sleep apnea AHI 6.3- never tolerated CPAP. Confined more indoors with less exposure to Spring pollen. Taking her allergy shots, GO 1:10. Occasional allegra works ok. To see Dr [name] next week for f/u since feet have been swelling. Noting vasomotor rhinorhea before meals. She had glaucoma before cataract surgery so we chose not to start ipratropium.   04/16/11- 69 yoF followed for allergic rhinitis, hx sleep apnea, complicated by AtrialFib. Since last here had chicken pox and an episode of PAFib with spontaneous reversion to NSR this Fall. 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 Has had flu vax. Now feels well.  Had failed CPAP in the past- could not sleep with it. Now denies sleep concerns. Continues allergy vaccine at 1:10 and feels this is doing well.   Review of Systems Constitutional:   No-   weight loss, night sweats, fevers, chills, fatigue, lassitude. HEENT:   No-  headaches, difficulty swallowing, tooth/dental problems, sore throat,       No-  sneezing, itching, ear ache, nasal congestion, post nasal drip,  CV:  No-   chest pain, orthopnea, PND, swelling in lower extremities, anasarca,                                  dizziness, palpitations Resp: No-   shortness of breath with exertion or at rest.              No-   productive cough,  No non-productive cough,  No- coughing up of blood.              No-   change in color of mucus.  No- wheezing.   Skin: No-   rash or lesions. GI:  No-   heartburn, indigestion, abdominal pain, nausea, vomiting, diarrhea,                 change in bowel habits, loss of appetite GU: No-   dysuria, change in color of urine, no urgency or frequency.  No-  flank pain. MS:  No-   joint pain or swelling.  No- decreased range of motion.  No- back pain. Neuro-     nothing unusual Psych:  No- change in mood or affect. No depression or anxiety.  No memory loss.      Objective:   Physical Exam General- Alert, Oriented, Affect-appropriate, Distress- none acute. Overweight Skin- rash-none, lesions- none, excoriation- none Lymphadenopathy- none Head- atraumatic            Eyes- Gross vision intact, PERRLA, conjunctivae clear secretions            Ears- Hearing, canals-normal            Nose- Clear, no-Septal dev, mucus, polyps, erosion, perforation             Throat- Mallampati II-III , mucosa clear , drainage- none, tonsils- atrophic Neck- flexible , trachea midline, no stridor , thyroid nl, carotid no bruit Chest - symmetrical excursion , unlabored           Heart/CV- RRR , no murmur , no gallop  , no rub, nl s1 s2                           -  JVD- none , edema- none, stasis changes- none, varices- none           Lung- clear to P&A, wheeze- none, cough- none , dullness-none, rub- none           Chest wall-  Abd- tender-no, distended-no, bowel sounds-present, HSM- no Br/ Gen/ Rectal- Not done, not indicated Extrem- cyanosis- none, clubbing, none, atrophy- none, strength- nl Neuro- grossly intact to observation           Is

## 2011-04-16 NOTE — Assessment & Plan Note (Signed)
Mild OSA, failed CPAP. Weight loss would help. Encouraged weight loss and sleep off back, but she is really not symptomatic.

## 2011-04-16 NOTE — Patient Instructions (Signed)
Continue allergy vaccine at 1:10    Please call as needed

## 2011-04-16 NOTE — Assessment & Plan Note (Signed)
She is satisfied that allergy vaccine has helped her and wishes to continue. We discussed goals and complementary medications again.

## 2011-04-21 ENCOUNTER — Encounter: Payer: Self-pay | Admitting: Cardiovascular Disease

## 2011-05-08 ENCOUNTER — Other Ambulatory Visit: Payer: Self-pay | Admitting: Cardiovascular Disease

## 2011-05-11 ENCOUNTER — Ambulatory Visit (INDEPENDENT_AMBULATORY_CARE_PROVIDER_SITE_OTHER): Payer: Medicare Other

## 2011-05-11 DIAGNOSIS — J309 Allergic rhinitis, unspecified: Secondary | ICD-10-CM

## 2011-05-25 ENCOUNTER — Ambulatory Visit (INDEPENDENT_AMBULATORY_CARE_PROVIDER_SITE_OTHER): Payer: Medicare Other | Admitting: *Deleted

## 2011-05-25 DIAGNOSIS — I4891 Unspecified atrial fibrillation: Secondary | ICD-10-CM

## 2011-05-25 LAB — POCT INR: INR: 2.5

## 2011-07-06 ENCOUNTER — Ambulatory Visit (INDEPENDENT_AMBULATORY_CARE_PROVIDER_SITE_OTHER): Payer: Medicare Other | Admitting: *Deleted

## 2011-07-06 DIAGNOSIS — I4891 Unspecified atrial fibrillation: Secondary | ICD-10-CM

## 2011-07-06 LAB — POCT INR: INR: 2.2

## 2011-08-17 ENCOUNTER — Ambulatory Visit (INDEPENDENT_AMBULATORY_CARE_PROVIDER_SITE_OTHER): Payer: Medicare Other | Admitting: *Deleted

## 2011-08-17 ENCOUNTER — Encounter: Payer: Medicare Other | Admitting: *Deleted

## 2011-08-17 DIAGNOSIS — I4891 Unspecified atrial fibrillation: Secondary | ICD-10-CM

## 2011-08-17 LAB — POCT INR: INR: 2.9

## 2011-09-15 ENCOUNTER — Ambulatory Visit: Payer: Medicare Other | Admitting: Cardiovascular Disease

## 2011-09-16 ENCOUNTER — Ambulatory Visit (INDEPENDENT_AMBULATORY_CARE_PROVIDER_SITE_OTHER): Payer: Medicare Other

## 2011-09-16 DIAGNOSIS — J309 Allergic rhinitis, unspecified: Secondary | ICD-10-CM

## 2011-09-24 ENCOUNTER — Other Ambulatory Visit: Payer: Self-pay | Admitting: Neurosurgery

## 2011-09-24 DIAGNOSIS — M48061 Spinal stenosis, lumbar region without neurogenic claudication: Secondary | ICD-10-CM

## 2011-09-24 DIAGNOSIS — M542 Cervicalgia: Secondary | ICD-10-CM

## 2011-09-24 DIAGNOSIS — IMO0002 Reserved for concepts with insufficient information to code with codable children: Secondary | ICD-10-CM

## 2011-09-24 DIAGNOSIS — M4716 Other spondylosis with myelopathy, lumbar region: Secondary | ICD-10-CM

## 2011-09-27 ENCOUNTER — Encounter: Payer: Self-pay | Admitting: Cardiovascular Disease

## 2011-09-27 ENCOUNTER — Ambulatory Visit (INDEPENDENT_AMBULATORY_CARE_PROVIDER_SITE_OTHER): Payer: Medicare Other | Admitting: *Deleted

## 2011-09-27 ENCOUNTER — Ambulatory Visit (INDEPENDENT_AMBULATORY_CARE_PROVIDER_SITE_OTHER): Payer: Medicare Other | Admitting: Cardiovascular Disease

## 2011-09-27 VITALS — BP 141/90 | HR 100 | Wt 196.0 lb

## 2011-09-27 DIAGNOSIS — R609 Edema, unspecified: Secondary | ICD-10-CM

## 2011-09-27 DIAGNOSIS — I4891 Unspecified atrial fibrillation: Secondary | ICD-10-CM

## 2011-09-27 DIAGNOSIS — E785 Hyperlipidemia, unspecified: Secondary | ICD-10-CM

## 2011-09-27 LAB — POCT INR: INR: 2.2

## 2011-09-27 NOTE — Assessment & Plan Note (Signed)
PAF in NSR last ECG 03/17/11 reviewed NSR rate 83 nornmal.  Continue ASA

## 2011-09-27 NOTE — Assessment & Plan Note (Signed)
Improved.  Continue current dose of diuretics.   

## 2011-09-27 NOTE — Progress Notes (Signed)
Patient ID: [name], female   DOB: [birth year], 71 y.o.   MRN: [id number] [name] is a 71 yo female with a history of paroxysmal atrial fibrillation on chronic Coumadin therapy. She has a history of chest discomfort. She had heart catheterization in Feb. 2002 that demonstrated normal coronary arteries and normal LV function. A Myoview study done in July 2009 demonstrated no ischemia. Her last echocardiogram done in July 2008 demonstrated an ejection fraction of 60%, mild LVH, mild LAE. 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 She's had multiple back surgeries.  Seeing Dr Phoebe Perch in a month.  Has had some irritable bowel and is on probiotics  Reviewed echo from 12/14/10 Normal RV/LV function no significant valve disease  Reviewed LE venous duplex 12/07/10  no DVT or venous insuficiency   ROS: Denies fever, malais, weight loss, blurry vision, decreased visual acuity, cough, sputum, SOB, hemoptysis, pleuritic pain, palpitaitons, heartburn, abdominal pain, melena, lower extremity edema, claudication, or rash.  All other systems reviewed and negative  General: Affect appropriate Healthy:  appears stated age HEENT: normal Neck supple with no adenopathy JVP normal no bruits no thyromegaly Lungs clear with no wheezing and good diaphragmatic motion Heart:  S1/S2 no murmur, no rub, gallop or click PMI normal Abdomen: benighn, BS positve, no tenderness, no AAA no bruit.  No HSM or HJR Distal pulses intact with no bruits No edema Neuro non-focal Skin warm and dry No muscular weakness Stiff back with limited ROM   Current Outpatient Prescriptions  Medication Sig Dispense Refill  . bimatoprost (LUMIGAN) 0.03 % ophthalmic drops Place 1 drop into both eyes at bedtime.        . Biotin 1000 MCG tablet Take 1,000 mcg by mouth daily.        . fexofenadine  (ALLEGRA) 180 MG tablet Take 180 mg by mouth as needed.       . gabapentin (NEURONTIN) 300 MG capsule Take 1 tablet by mouth at bedtime.       . metoprolol (TOPROL-XL) 100 MG 24 hr tablet TAKE 1 TABLET DAILY  90 tablet  1  . NON FORMULARY CALCIUM MAG, AND ZINC 1 TAB PO QD      . NON FORMULARY ALLERGY SHOTS WEEKLY      . omeprazole (PRILOSEC) 20 MG capsule Take 20 mg by mouth daily.        . pramipexole (MIRAPEX) 0.5 MG tablet Take 0.5 mg by mouth as needed.      . pravastatin (PRAVACHOL) 40 MG tablet Take 40 mg by mouth daily.        . Probiotic Product (ALIGN PO) Take 1 tablet by mouth daily.      . Sennosides (SENNA LAX PO) Take by mouth 2 (two) times daily.      Marland Kitchen triamterene-hydrochlorothiazide (DYAZIDE) 37.5-25 MG per capsule Take 1 capsule by mouth daily.        Marland Kitchen warfarin (COUMADIN) 5 MG tablet Take as directed by Anticoagulation clinic   90 tablet  1    Allergies  Review of patient's allergies indicates no known allergies.  Electrocardiogram:  Assessment and Plan

## 2011-09-27 NOTE — Patient Instructions (Signed)
Your physician wants you to follow-up in: 6 months with Dr. Nishan. You will receive a reminder letter in the mail two months in advance. If you don't receive a letter, please call our office to schedule the follow-up appointment.  

## 2011-09-27 NOTE — Assessment & Plan Note (Signed)
Cholesterol is at goal.  Continue current dose of statin and diet Rx.  No myalgias or side effects.  F/U  LFT's in 6 months. Lab Results  Component Value Date   LDLCALC  Value: 101        Total Cholesterol/HDL:CHD Risk Coronary Heart Disease Risk Table                     Men   Women  1/2 Average Risk   3.4   3.3* 12/22/2006             

## 2011-09-29 ENCOUNTER — Ambulatory Visit
Admission: RE | Admit: 2011-09-29 | Discharge: 2011-09-29 | Disposition: A | Discharge status: Home / Self Care | Payer: Medicare Other | Source: Ambulatory Visit | Attending: Neurosurgery | Admitting: Neurosurgery

## 2011-09-29 DIAGNOSIS — M542 Cervicalgia: Secondary | ICD-10-CM

## 2011-09-29 DIAGNOSIS — M4716 Other spondylosis with myelopathy, lumbar region: Secondary | ICD-10-CM

## 2011-09-29 DIAGNOSIS — IMO0002 Reserved for concepts with insufficient information to code with codable children: Secondary | ICD-10-CM

## 2011-09-29 DIAGNOSIS — M48061 Spinal stenosis, lumbar region without neurogenic claudication: Secondary | ICD-10-CM

## 2011-09-29 MED ORDER — GADOBENATE DIMEGLUMINE 529 MG/ML IV SOLN
18.0000 mL | Freq: Once | INTRAVENOUS | Status: AC | PRN
  Administered 2011-09-29: 18 mL via INTRAVENOUS

## 2011-10-04 ENCOUNTER — Encounter (HOSPITAL_COMMUNITY): Payer: Self-pay | Admitting: Pharmacy Technician

## 2011-10-04 ENCOUNTER — Other Ambulatory Visit: Payer: Self-pay | Admitting: Neurosurgery

## 2011-10-08 ENCOUNTER — Encounter (HOSPITAL_COMMUNITY): Payer: Self-pay

## 2011-10-08 ENCOUNTER — Encounter (HOSPITAL_COMMUNITY)
Admission: RE | Admit: 2011-10-08 | Discharge: 2011-10-08 | Disposition: A | Discharge status: Home / Self Care | Payer: Medicare Other | Source: Ambulatory Visit | Attending: Anesthesiology | Admitting: Anesthesiology

## 2011-10-08 ENCOUNTER — Encounter (HOSPITAL_COMMUNITY)
Admission: RE | Admit: 2011-10-08 | Discharge: 2011-10-08 | Disposition: A | Discharge status: Home / Self Care | Payer: Medicare Other | Source: Ambulatory Visit | Attending: Neurosurgery | Admitting: Neurosurgery

## 2011-10-08 HISTORY — DX: Gastro-esophageal reflux disease without esophagitis: K21.9

## 2011-10-08 LAB — PROTIME-INR
INR: 1.51 — ABNORMAL HIGH (ref 0.00–1.49)
Prothrombin Time: 18.5 seconds — ABNORMAL HIGH (ref 11.6–15.2)

## 2011-10-08 LAB — CBC
HCT: 41.7 % (ref 36.0–46.0)
Hemoglobin: 13.7 g/dL (ref 12.0–15.0)
MCH: 27.7 pg (ref 26.0–34.0)
MCHC: 32.9 g/dL (ref 30.0–36.0)
MCV: 84.4 fL (ref 78.0–100.0)
Platelets: 176 10*3/uL (ref 150–400)
RBC: 4.94 MIL/uL (ref 3.87–5.11)
RDW: 14.6 % (ref 11.5–15.5)
WBC: 4.8 10*3/uL (ref 4.0–10.5)

## 2011-10-08 LAB — BASIC METABOLIC PANEL
BUN: 19 mg/dL (ref 6–23)
CO2: 27 mEq/L (ref 19–32)
Calcium: 9.8 mg/dL (ref 8.4–10.5)
Chloride: 103 mEq/L (ref 96–112)
Creatinine, Ser: 0.9 mg/dL (ref 0.50–1.10)
GFR calc Af Amer: 74 mL/min — ABNORMAL LOW (ref >90)
GFR calc non Af Amer: 64 mL/min — ABNORMAL LOW (ref >90)
Glucose, Bld: 96 mg/dL (ref 70–99)
Potassium: 3.8 mEq/L (ref 3.5–5.1)
Sodium: 141 mEq/L (ref 135–145)

## 2011-10-08 LAB — TYPE AND SCREEN
ABO/RH(D): A POS
Antibody Screen: NEGATIVE

## 2011-10-08 LAB — APTT: aPTT: 32 seconds (ref 24–37)

## 2011-10-08 LAB — SURGICAL PCR SCREEN
MRSA, PCR: NEGATIVE
Staphylococcus aureus: NEGATIVE

## 2011-10-08 NOTE — Pre-Procedure Instructions (Addendum)
22 [name]  10/08/2011   Your procedure is scheduled on: 10/12/11  Report to [facility] at 530* AM.  Call this number if you have problems the morning of surgery: [phone]   Remember:   Do not eat food:After Midnight.  May have clear liquids: up to 4 Hours before arrival. 130 am  Clear liquids include soda, tea, black coffee, apple or grape juice, broth.  Take these medicines the morning of surgery with A SIP OF WATER:metoprolol,  allegra,    STOP  Over the counter meds, coumadin now   Do not wear jewelry, make-up or nail polish.  Do not wear lotions, powders, or perfumes. You may wear deodorant.  Do not shave 48 hours prior to surgery.  Do not bring valuables to the hospital.  Contacts, dentures or bridgework may not be worn into surgery.  Leave suitcase in the car. After surgery it may be brought to your room.  For patients admitted to the hospital, checkout time is 11:00 AM the day of discharge.   Patients discharged the day of surgery will not be allowed to drive home.  Name and phone number of your driver: [name] [phone]  Special Instructions: CHG Shower Use Special Wash: 1/2 bottle night before surgery and 1/2 bottle morning of surgery.   Please read over the following fact sheets that you were given: Pain Booklet, Coughing and Deep Breathing, Blood Transfusion Information, MRSA Information and Surgical Site Infection Prevention

## 2011-10-08 NOTE — Progress Notes (Addendum)
Notes from dr [place] 4/13 in epic including cath02, stress 09, echo 08,12 in epic . 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

## 2011-10-11 NOTE — Consult Note (Addendum)
Anesthesia Chart Review:  Patient is a 71 year old female scheduled for extension of fusion to L1, L1-2 redo laminectomy, possible PLIF on 10/12/11.  History includes PAF on Coumadin, non-smoker, CHF, HTN, scoliosis, mild OSA with failed CPAP (Dr. Jetty Duhamel), HLD, GERD, history of SBO.  PCP is Dr. Manus Gunning with [facility].  Cardiologist is Dr. [name].  She was just see on 09/27/11.  He was aware that she was planning to see Dr. Blanche East.  According to his notes, "She had heart catheterization in Feb. 2002 that demonstrated normal coronary arteries and normal LV function. A Myoview study done in July 2009 demonstrated no ischemia. Her last echocardiogram done in July 2008 demonstrated an ejection fraction of 60%, mild LVH, mild LAE."  She has had a more recent echo done of 12/14/10 showing: - Left ventricle: The cavity size was normal. There was mild concentric hypertrophy. Systolic function was normal. The estimated ejection fraction was in the range of 55% to 60%. Wall motion was normal; there were no regional wall motion abnormalities. - Atrial septum: No defect or patent foramen ovale was identified. - Trivial TR.  EKG on 10/08/11 showed SR with occasional PVC, septal infarct (age undetermined).  Other than the PVC, I don't think her EKG is significantly changed since 03/17/11.    CXR on 10/08/11 showed mild hyperinflation, no active disease.  Labs noted. PT 18.5/INR 1.51. PTT WNL. Will repeat PT/INR on arrival.  Shonna Chock, PA-C

## 2011-10-12 ENCOUNTER — Encounter (HOSPITAL_COMMUNITY): Payer: Self-pay | Admitting: Vascular Surgery

## 2011-10-12 ENCOUNTER — Inpatient Hospital Stay (HOSPITAL_COMMUNITY): Payer: Medicare Other

## 2011-10-12 ENCOUNTER — Encounter (HOSPITAL_COMMUNITY)
Admission: RE | Disposition: A | Discharge status: Home / Self Care | Payer: Self-pay | Source: Ambulatory Visit | Attending: Neurosurgery

## 2011-10-12 ENCOUNTER — Encounter (HOSPITAL_COMMUNITY): Payer: Self-pay | Admitting: *Deleted

## 2011-10-12 ENCOUNTER — Inpatient Hospital Stay (HOSPITAL_COMMUNITY): Payer: Medicare Other | Admitting: Vascular Surgery

## 2011-10-12 ENCOUNTER — Inpatient Hospital Stay (HOSPITAL_COMMUNITY)
Admission: RE | Admit: 2011-10-12 | Discharge: 2011-10-19 | DRG: 460 | Disposition: A | Discharge status: Home / Self Care | Payer: Medicare Other | Source: Ambulatory Visit | Attending: Neurosurgery | Admitting: Neurosurgery

## 2011-10-12 DIAGNOSIS — M48061 Spinal stenosis, lumbar region without neurogenic claudication: Principal | ICD-10-CM | POA: Diagnosis present

## 2011-10-12 DIAGNOSIS — K219 Gastro-esophageal reflux disease without esophagitis: Secondary | ICD-10-CM | POA: Diagnosis present

## 2011-10-12 DIAGNOSIS — Y9210 Unspecified residential institution as the place of occurrence of the external cause: Secondary | ICD-10-CM | POA: Diagnosis not present

## 2011-10-12 DIAGNOSIS — I10 Essential (primary) hypertension: Secondary | ICD-10-CM | POA: Diagnosis present

## 2011-10-12 DIAGNOSIS — K560 Paralytic ileus: Secondary | ICD-10-CM | POA: Diagnosis not present

## 2011-10-12 DIAGNOSIS — Y831 Surgical operation with implant of artificial internal device as the cause of abnormal reaction of the patient, or of later complication, without mention of misadventure at the time of the procedure: Secondary | ICD-10-CM | POA: Diagnosis not present

## 2011-10-12 DIAGNOSIS — Z79899 Other long term (current) drug therapy: Secondary | ICD-10-CM

## 2011-10-12 DIAGNOSIS — Z7901 Long term (current) use of anticoagulants: Secondary | ICD-10-CM

## 2011-10-12 DIAGNOSIS — Z8249 Family history of ischemic heart disease and other diseases of the circulatory system: Secondary | ICD-10-CM

## 2011-10-12 DIAGNOSIS — E785 Hyperlipidemia, unspecified: Secondary | ICD-10-CM | POA: Diagnosis present

## 2011-10-12 DIAGNOSIS — Z8261 Family history of arthritis: Secondary | ICD-10-CM

## 2011-10-12 DIAGNOSIS — Z825 Family history of asthma and other chronic lower respiratory diseases: Secondary | ICD-10-CM

## 2011-10-12 DIAGNOSIS — K929 Disease of digestive system, unspecified: Secondary | ICD-10-CM | POA: Diagnosis not present

## 2011-10-12 DIAGNOSIS — Z981 Arthrodesis status: Secondary | ICD-10-CM

## 2011-10-12 LAB — PROTIME-INR
INR: 1.06 (ref 0.00–1.49)
Prothrombin Time: 14 seconds (ref 11.6–15.2)

## 2011-10-12 SURGERY — POSTERIOR LUMBAR FUSION 1 LEVEL
Anesthesia: General | Site: Back | Wound class: Clean

## 2011-10-12 MED ORDER — ONDANSETRON HCL 4 MG/2ML IJ SOLN
4.0000 mg | Freq: Four times a day (QID) | INTRAMUSCULAR | Status: DC | PRN
  Filled 2011-10-12: qty 2

## 2011-10-12 MED ORDER — METOPROLOL SUCCINATE ER 100 MG PO TB24
100.0000 mg | ORAL_TABLET | Freq: Every day | ORAL | Status: DC
  Administered 2011-10-13 – 2011-10-19 (×6): 100 mg via ORAL
  Filled 2011-10-12 (×7): qty 1

## 2011-10-12 MED ORDER — CEFAZOLIN SODIUM 1-5 GM-% IV SOLN
INTRAVENOUS | Status: DC | PRN
  Administered 2011-10-12: 2 g via INTRAVENOUS

## 2011-10-12 MED ORDER — CEFAZOLIN SODIUM 1-5 GM-% IV SOLN
1.0000 g | Freq: Three times a day (TID) | INTRAVENOUS | Status: AC
  Administered 2011-10-12 – 2011-10-13 (×4): 1 g via INTRAVENOUS
  Filled 2011-10-12 (×5): qty 50

## 2011-10-12 MED ORDER — GABAPENTIN 300 MG PO CAPS
300.0000 mg | ORAL_CAPSULE | Freq: Every day | ORAL | Status: DC
  Administered 2011-10-12 – 2011-10-17 (×6): 300 mg via ORAL
  Filled 2011-10-12 (×7): qty 1

## 2011-10-12 MED ORDER — FLEET ENEMA 7-19 GM/118ML RE ENEM: 1.0000 | ENEMA | Freq: Once | RECTAL | Status: AC | PRN

## 2011-10-12 MED ORDER — PRAMIPEXOLE DIHYDROCHLORIDE 0.25 MG PO TABS
0.5000 mg | ORAL_TABLET | Freq: Three times a day (TID) | ORAL | Status: DC | PRN
Start: 2011-10-12 — End: 2011-10-19
  Filled 2011-10-12: qty 2

## 2011-10-12 MED ORDER — ACETAMINOPHEN 650 MG RE SUPP: 650.0000 mg | RECTAL | Status: DC | PRN

## 2011-10-12 MED ORDER — SODIUM CHLORIDE 0.9 % IR SOLN
Status: DC | PRN
  Administered 2011-10-12: 09:00:00

## 2011-10-12 MED ORDER — NALOXONE HCL 0.4 MG/ML IJ SOLN: 0.4000 mg | INTRAMUSCULAR | Status: DC | PRN

## 2011-10-12 MED ORDER — TRIAMTERENE-HCTZ 37.5-25 MG PO CAPS: 1.0000 | ORAL_CAPSULE | Freq: Every day | ORAL | Status: DC

## 2011-10-12 MED ORDER — PHENYLEPHRINE HCL 10 MG/ML IJ SOLN
INTRAMUSCULAR | Status: DC | PRN
  Administered 2011-10-12: 80 ug via INTRAVENOUS
  Administered 2011-10-12 (×2): 40 ug via INTRAVENOUS
  Administered 2011-10-12 (×2): 80 ug via INTRAVENOUS

## 2011-10-12 MED ORDER — DEXAMETHASONE SODIUM PHOSPHATE 4 MG/ML IJ SOLN
INTRAMUSCULAR | Status: DC | PRN
  Administered 2011-10-12: 10 mg via INTRAVENOUS

## 2011-10-12 MED ORDER — PROMETHAZINE HCL 25 MG/ML IJ SOLN
12.5000 mg | INTRAMUSCULAR | Status: DC | PRN
  Administered 2011-10-14: 12.5 mg via INTRAVENOUS
  Filled 2011-10-12 (×2): qty 1

## 2011-10-12 MED ORDER — SODIUM CHLORIDE 0.9 % IV SOLN
INTRAVENOUS | Status: AC
  Administered 2011-10-12: 1000 mL via INTRAVENOUS
  Filled 2011-10-12: qty 500

## 2011-10-12 MED ORDER — SIMVASTATIN 20 MG PO TABS
20.0000 mg | ORAL_TABLET | Freq: Every day | ORAL | Status: DC
  Administered 2011-10-12 – 2011-10-18 (×6): 20 mg via ORAL
  Filled 2011-10-12 (×8): qty 1

## 2011-10-12 MED ORDER — ONDANSETRON HCL 4 MG/2ML IJ SOLN
INTRAMUSCULAR | Status: DC | PRN
  Administered 2011-10-12: 4 mg via INTRAVENOUS

## 2011-10-12 MED ORDER — 0.9 % SODIUM CHLORIDE (POUR BTL) OPTIME
TOPICAL | Status: DC | PRN
  Administered 2011-10-12: 1000 mL

## 2011-10-12 MED ORDER — PROPOFOL 10 MG/ML IV EMUL
INTRAVENOUS | Status: DC | PRN
  Administered 2011-10-12: 180 mg via INTRAVENOUS

## 2011-10-12 MED ORDER — PROMETHAZINE HCL 25 MG PO TABS: 12.5000 mg | ORAL_TABLET | ORAL | Status: DC | PRN

## 2011-10-12 MED ORDER — FENTANYL CITRATE 0.05 MG/ML IJ SOLN
INTRAMUSCULAR | Status: DC | PRN
  Administered 2011-10-12: 50 ug via INTRAVENOUS
  Administered 2011-10-12: 100 ug via INTRAVENOUS

## 2011-10-12 MED ORDER — PANTOPRAZOLE SODIUM 40 MG PO TBEC
40.0000 mg | DELAYED_RELEASE_TABLET | Freq: Every day | ORAL | Status: DC
  Administered 2011-10-12 – 2011-10-14 (×3): 40 mg via ORAL
  Filled 2011-10-12 (×3): qty 1

## 2011-10-12 MED ORDER — DOCUSATE SODIUM 100 MG PO CAPS
100.0000 mg | ORAL_CAPSULE | Freq: Two times a day (BID) | ORAL | Status: DC
  Administered 2011-10-12 – 2011-10-19 (×8): 100 mg via ORAL
  Filled 2011-10-12 (×7): qty 1

## 2011-10-12 MED ORDER — MAGNESIUM HYDROXIDE 400 MG/5ML PO SUSP
30.0000 mL | Freq: Every day | ORAL | Status: DC | PRN
  Administered 2011-10-15: 30 mL via ORAL
  Filled 2011-10-12 (×2): qty 30

## 2011-10-12 MED ORDER — MIDAZOLAM HCL 5 MG/5ML IJ SOLN
INTRAMUSCULAR | Status: DC | PRN
  Administered 2011-10-12: 1 mg via INTRAVENOUS

## 2011-10-12 MED ORDER — METHOCARBAMOL 100 MG/ML IJ SOLN
500.0000 mg | Freq: Four times a day (QID) | INTRAVENOUS | Status: DC | PRN
  Filled 2011-10-12 (×2): qty 5

## 2011-10-12 MED ORDER — KETOROLAC TROMETHAMINE 30 MG/ML IJ SOLN
15.0000 mg | Freq: Four times a day (QID) | INTRAMUSCULAR | Status: AC
  Administered 2011-10-12: 15 mg via INTRAVENOUS
  Administered 2011-10-12: 19:00:00 via INTRAVENOUS
  Administered 2011-10-13 (×2): 15 mg via INTRAVENOUS
  Filled 2011-10-12 (×4): qty 1

## 2011-10-12 MED ORDER — MORPHINE SULFATE (PF) 1 MG/ML IV SOLN
INTRAVENOUS | Status: DC
  Administered 2011-10-12: 9 mg via INTRAVENOUS
  Administered 2011-10-12: 3 mg via INTRAVENOUS
  Administered 2011-10-12: 25 mL via INTRAVENOUS
  Administered 2011-10-13: 2.6 mg via INTRAVENOUS
  Administered 2011-10-13: 1 mg via INTRAVENOUS
  Administered 2011-10-13: 0.198 mg via INTRAVENOUS

## 2011-10-12 MED ORDER — BISACODYL 5 MG PO TBEC
5.0000 mg | DELAYED_RELEASE_TABLET | Freq: Every day | ORAL | Status: DC | PRN
  Administered 2011-10-15: 5 mg via ORAL
  Filled 2011-10-12: qty 1

## 2011-10-12 MED ORDER — MORPHINE SULFATE (PF) 1 MG/ML IV SOLN
INTRAVENOUS | Status: AC
  Filled 2011-10-12: qty 25

## 2011-10-12 MED ORDER — BIMATOPROST 0.01 % OP SOLN
1.0000 [drp] | Freq: Every day | OPHTHALMIC | Status: DC
  Administered 2011-10-12 – 2011-10-18 (×7): 1 [drp] via OPHTHALMIC
  Filled 2011-10-12 (×2): qty 2.5

## 2011-10-12 MED ORDER — MORPHINE SULFATE 4 MG/ML IJ SOLN: 0.0500 mg/kg | INTRAMUSCULAR | Status: DC | PRN

## 2011-10-12 MED ORDER — NEOSTIGMINE METHYLSULFATE 1 MG/ML IJ SOLN
INTRAMUSCULAR | Status: DC | PRN
  Administered 2011-10-12: 4 mg via INTRAVENOUS

## 2011-10-12 MED ORDER — BISACODYL 10 MG RE SUPP: 10.0000 mg | Freq: Every day | RECTAL | Status: DC | PRN

## 2011-10-12 MED ORDER — GLYCOPYRROLATE 0.2 MG/ML IJ SOLN
INTRAMUSCULAR | Status: DC | PRN
  Administered 2011-10-12: .8 mg via INTRAVENOUS

## 2011-10-12 MED ORDER — LORATADINE 10 MG PO TABS
10.0000 mg | ORAL_TABLET | Freq: Every day | ORAL | Status: DC
  Administered 2011-10-12 – 2011-10-18 (×7): 10 mg via ORAL
  Filled 2011-10-12 (×9): qty 1

## 2011-10-12 MED ORDER — DIPHENHYDRAMINE HCL 12.5 MG/5ML PO ELIX: 12.5000 mg | ORAL_SOLUTION | Freq: Four times a day (QID) | ORAL | Status: DC | PRN

## 2011-10-12 MED ORDER — MAGNESIUM HYDROXIDE NICU ORAL SYRINGE 400 MG/5 ML
30.0000 mL | Freq: Every day | ORAL | Status: AC
  Administered 2011-10-12 – 2011-10-14 (×3): 30 mL via ORAL
  Filled 2011-10-12 (×3): qty 30

## 2011-10-12 MED ORDER — METHOCARBAMOL 500 MG PO TABS
500.0000 mg | ORAL_TABLET | Freq: Four times a day (QID) | ORAL | Status: DC | PRN
  Administered 2011-10-15 – 2011-10-19 (×4): 500 mg via ORAL
  Filled 2011-10-12 (×4): qty 1

## 2011-10-12 MED ORDER — LIDOCAINE HCL (CARDIAC) 20 MG/ML IV SOLN
INTRAVENOUS | Status: DC | PRN
  Administered 2011-10-12: 100 mg via INTRAVENOUS

## 2011-10-12 MED ORDER — ACETAMINOPHEN 325 MG PO TABS
650.0000 mg | ORAL_TABLET | ORAL | Status: DC | PRN
  Administered 2011-10-17 – 2011-10-19 (×2): 650 mg via ORAL
  Filled 2011-10-12 (×2): qty 2

## 2011-10-12 MED ORDER — CALCIUM-MAGNESIUM-ZINC 500-250-12.5 MG PO TABS: 1.0000 | ORAL_TABLET | Freq: Every day | ORAL | Status: DC

## 2011-10-12 MED ORDER — KCL IN DEXTROSE-NACL 20-5-0.45 MEQ/L-%-% IV SOLN
INTRAVENOUS | Status: DC
  Administered 2011-10-12 – 2011-10-15 (×2): via INTRAVENOUS
  Filled 2011-10-12 (×8): qty 1000

## 2011-10-12 MED ORDER — BACITRACIN 50000 UNITS IM SOLR
INTRAMUSCULAR | Status: AC
  Filled 2011-10-12: qty 1

## 2011-10-12 MED ORDER — SODIUM CHLORIDE 0.9 % IV SOLN: 250.0000 mL | INTRAVENOUS | Status: DC

## 2011-10-12 MED ORDER — SODIUM CHLORIDE 0.9 % IJ SOLN: 9.0000 mL | INTRAMUSCULAR | Status: DC | PRN

## 2011-10-12 MED ORDER — DIPHENHYDRAMINE HCL 50 MG/ML IJ SOLN: 12.5000 mg | Freq: Four times a day (QID) | INTRAMUSCULAR | Status: DC | PRN

## 2011-10-12 MED ORDER — LIDOCAINE-EPINEPHRINE 1 %-1:100000 IJ SOLN
INTRAMUSCULAR | Status: DC | PRN
  Administered 2011-10-12: 20 mL

## 2011-10-12 MED ORDER — ONDANSETRON HCL 4 MG/2ML IJ SOLN
4.0000 mg | INTRAMUSCULAR | Status: DC | PRN
  Administered 2011-10-14 – 2011-10-16 (×6): 4 mg via INTRAVENOUS
  Filled 2011-10-12 (×5): qty 2

## 2011-10-12 MED ORDER — LACTATED RINGERS IV SOLN
INTRAVENOUS | Status: DC | PRN
  Administered 2011-10-12 (×2): via INTRAVENOUS

## 2011-10-12 MED ORDER — HYDROMORPHONE HCL PF 1 MG/ML IJ SOLN: 0.2500 mg | INTRAMUSCULAR | Status: DC | PRN

## 2011-10-12 MED ORDER — SODIUM CHLORIDE 0.9 % IJ SOLN
3.0000 mL | Freq: Two times a day (BID) | INTRAMUSCULAR | Status: DC
  Administered 2011-10-13 – 2011-10-18 (×5): 3 mL via INTRAVENOUS

## 2011-10-12 MED ORDER — BIMATOPROST 0.03 % OP SOLN
1.0000 [drp] | Freq: Every day | OPHTHALMIC | Status: DC
  Filled 2011-10-12: qty 2.5

## 2011-10-12 MED ORDER — ZOLPIDEM TARTRATE 5 MG PO TABS: 5.0000 mg | ORAL_TABLET | Freq: Every evening | ORAL | Status: DC | PRN

## 2011-10-12 MED ORDER — TRIAMTERENE-HCTZ 37.5-25 MG PO CAPS
1.0000 | ORAL_CAPSULE | Freq: Every day | ORAL | Status: DC
  Filled 2011-10-12 (×2): qty 1

## 2011-10-12 MED ORDER — VECURONIUM BROMIDE 10 MG IV SOLR
INTRAVENOUS | Status: DC | PRN
  Administered 2011-10-12: 8 mg via INTRAVENOUS
  Administered 2011-10-12: 2 mg via INTRAVENOUS
  Administered 2011-10-12: 3 mg via INTRAVENOUS
  Administered 2011-10-12: 2 mg via INTRAVENOUS

## 2011-10-12 MED ORDER — TRIAMTERENE-HCTZ 37.5-25 MG PO TABS
1.0000 | ORAL_TABLET | Freq: Every day | ORAL | Status: DC
  Administered 2011-10-12 – 2011-10-19 (×5): 1 via ORAL
  Filled 2011-10-12 (×5): qty 1

## 2011-10-12 MED ORDER — THROMBIN 20000 UNITS EX KIT
PACK | CUTANEOUS | Status: DC | PRN
  Administered 2011-10-12: 09:00:00 via TOPICAL

## 2011-10-12 MED ORDER — SODIUM CHLORIDE 0.9 % IJ SOLN: 3.0000 mL | INTRAMUSCULAR | Status: DC | PRN

## 2011-10-12 SURGICAL SUPPLY — 66 items
ADH SKN CLS APL DERMABOND .7 (GAUZE/BANDAGES/DRESSINGS)
APL SKNCLS STERI-STRIP NONHPOA (GAUZE/BANDAGES/DRESSINGS) ×1
BAG DECANTER FOR FLEXI CONT (MISCELLANEOUS) ×2 IMPLANT
BENZOIN TINCTURE PRP APPL 2/3 (GAUZE/BANDAGES/DRESSINGS) ×2 IMPLANT
BLADE SURG ROTATE 9660 (MISCELLANEOUS) IMPLANT
BUR PRECISION FLUTE 5.0 (BURR) ×2 IMPLANT
CAGE BULLET CONCORDE 9X9X27 (Cage) ×1 IMPLANT
CANISTER SUCTION 2500CC (MISCELLANEOUS) ×2 IMPLANT
CLOTH BEACON ORANGE TIMEOUT ST (SAFETY) ×2 IMPLANT
CONT SPEC 4OZ CLIKSEAL STRL BL (MISCELLANEOUS) ×4 IMPLANT
COVER BACK TABLE 24X17X13 BIG (DRAPES) IMPLANT
COVER TABLE BACK 60X90 (DRAPES) ×2 IMPLANT
DECANTER SPIKE VIAL GLASS SM (MISCELLANEOUS) ×2 IMPLANT
DERMABOND ADVANCED (GAUZE/BANDAGES/DRESSINGS)
DERMABOND ADVANCED .7 DNX12 (GAUZE/BANDAGES/DRESSINGS) ×1 IMPLANT
DRAPE C-ARM 42X72 X-RAY (DRAPES) ×4 IMPLANT
DRAPE LAPAROTOMY 100X72X124 (DRAPES) ×2 IMPLANT
DRAPE POUCH INSTRU U-SHP 10X18 (DRAPES) ×2 IMPLANT
DRAPE PROXIMA HALF (DRAPES) IMPLANT
DRESSING TELFA 8X3 (GAUZE/BANDAGES/DRESSINGS) ×2 IMPLANT
DURAPREP 26ML APPLICATOR (WOUND CARE) ×2 IMPLANT
ELECT REM PT RETURN 9FT ADLT (ELECTROSURGICAL) ×2
ELECTRODE REM PT RTRN 9FT ADLT (ELECTROSURGICAL) ×1 IMPLANT
Expedium 5.5 Variable Offset Connector 5.5-6.35x5. ×2 IMPLANT
GAUZE SPONGE 4X4 16PLY XRAY LF (GAUZE/BANDAGES/DRESSINGS) IMPLANT
GLOVE BIOGEL M 8.0 STRL (GLOVE) ×1 IMPLANT
GLOVE ECLIPSE 7.5 STRL STRAW (GLOVE) ×4 IMPLANT
GLOVE EXAM NITRILE LRG STRL (GLOVE) IMPLANT
GLOVE EXAM NITRILE MD LF STRL (GLOVE) ×4 IMPLANT
GLOVE EXAM NITRILE XL STR (GLOVE) IMPLANT
GLOVE EXAM NITRILE XS STR PU (GLOVE) IMPLANT
GLOVE INDICATOR 7.0 STRL GRN (GLOVE) ×2 IMPLANT
GLOVE SURG SS PI 6.5 STRL IVOR (GLOVE) ×4 IMPLANT
GOWN BRE IMP SLV AUR LG STRL (GOWN DISPOSABLE) ×3 IMPLANT
GOWN BRE IMP SLV AUR XL STRL (GOWN DISPOSABLE) ×2 IMPLANT
GOWN STRL REIN 2XL LVL4 (GOWN DISPOSABLE) ×2 IMPLANT
HEALOS 5CC STRIP (Peek) ×1 IMPLANT
KIT BASIN OR (CUSTOM PROCEDURE TRAY) ×2 IMPLANT
KIT ROOM TURNOVER OR (KITS) ×2 IMPLANT
MILL MEDIUM DISP (BLADE) ×1 IMPLANT
NEEDLE HYPO 22GX1.5 SAFETY (NEEDLE) ×2 IMPLANT
NS IRRIG 1000ML POUR BTL (IV SOLUTION) ×2 IMPLANT
PACK LAMINECTOMY NEURO (CUSTOM PROCEDURE TRAY) ×2 IMPLANT
PAD ARMBOARD 7.5X6 YLW CONV (MISCELLANEOUS) ×6 IMPLANT
PATTIES SURGICAL .75X.75 (GAUZE/BANDAGES/DRESSINGS) ×2 IMPLANT
PUTTY BONE DBX 5CC MIX (Putty) ×1 IMPLANT
ROD EXPEDIUM 120MM (Rod) ×1 IMPLANT
SCREW POLY EXPEDIUM 5.5 5X50MM (Screw) ×2 IMPLANT
SCREW SET SINGLE INNER (Screw) ×2 IMPLANT
SLEEVE SURGEON STRL (DRAPES) ×1 IMPLANT
SPONGE GAUZE 4X4 12PLY (GAUZE/BANDAGES/DRESSINGS) ×2 IMPLANT
SPONGE LAP 4X18 X RAY DECT (DISPOSABLE) IMPLANT
SPONGE SURGIFOAM ABS GEL 100 (HEMOSTASIS) ×2 IMPLANT
STRIP CLOSURE SKIN 1/2X4 (GAUZE/BANDAGES/DRESSINGS) ×2 IMPLANT
SUT VIC AB 0 CT1 18XCR BRD8 (SUTURE) ×2 IMPLANT
SUT VIC AB 0 CT1 8-18 (SUTURE) ×6
SUT VIC AB 2-0 CP2 18 (SUTURE) ×4 IMPLANT
SUT VIC AB 3-0 SH 8-18 (SUTURE) ×4 IMPLANT
SYR 20ML ECCENTRIC (SYRINGE) ×2 IMPLANT
TAPE CLOTH SURG 4X10 WHT LF (GAUZE/BANDAGES/DRESSINGS) ×1 IMPLANT
TAPE UMBILICAL 1/8 X36 TWILL (MISCELLANEOUS) ×1 IMPLANT
TOWEL OR 17X24 6PK STRL BLUE (TOWEL DISPOSABLE) ×2 IMPLANT
TOWEL OR 17X26 10 PK STRL BLUE (TOWEL DISPOSABLE) ×2 IMPLANT
TRAP SPECIMEN MUCOUS 40CC (MISCELLANEOUS) ×1 IMPLANT
TRAY FOLEY CATH 14FRSI W/METER (CATHETERS) ×2 IMPLANT
WATER STERILE IRR 1000ML POUR (IV SOLUTION) ×2 IMPLANT

## 2011-10-12 NOTE — Progress Notes (Signed)
Pt has rings x2 on left hand ... Unable to remove... .. Rings taped.

## 2011-10-12 NOTE — Preoperative (Signed)
Beta Blockers   Reason not to administer Beta Blockers:Not Applicable 

## 2011-10-12 NOTE — Op Note (Signed)
10/12/2011  10:56 AM  PATIENT:  [name]  71 y.o. female  PRE-OPERATIVE DIAGNOSIS:  Lumbar stenosis, Lumbar radiculopathy, prior surgery, transitional level syndrom  POST-OPERATIVE DIAGNOSIS: same  PROCEDURE:  Procedure(s):redo decompressive lam decompressing L1 and L2 roots (2levels), TLIF L1-2, interbody cage L1-2,expedium  segmented pedicle screw fixation (extending system to L1),  POSTERIORlateral LUMBAR FUSION 1 LEVEL(L1-2), autograft, helos (allograft), bone marrow aspirate  SURGEON:  Surgeon(s): Clydene Fake, MD Karn Cassis, MD-[facility]ANT[facility]    ANESTHESIA:   general  EBL:  Total I/O In: 1900 [I.V.:1900] Out: 800 [Urine:600; Blood:200]  BLOOD ADMINISTERED:none  DRAINS: none   SPECIMEN:  No Specimen  DICTATION: Patient has been having right knee burning and bilateral leg numbness especially in the feet. workup included an MRI lumbar spine which are progressive the stenosis of the L1-2 level which is above her prior fusion L2-S1. After discussion was decided to proceed with surgical decompression and extension of her fusion.   The operating room anesthesia induced patient placed in prone position Wilson frame all pressure points padded. Patient prepped draped sterile fashion site of incision injected with 20 cc 1% lidocaine. Made at the upper prior incision and extended cephalad incision to the fascia hemostasis obtained with Bovie cauterization fascia was incised and subperiosteal dissection done over the at T12-L1 spinous process lamina and facets and then uncovered pedicle screws at L2 and L3 bilaterally. Lateral exposing the transverse processes of L1 and L2 bilaterally. Between was then done removing the spinous process lamina of L1 and was left of the L2 and carefully dissecting the scar tissue the previous surgery. Decompress the central canal and bilateral lateral gutters make sure he did decompression the L1 and L2 nerve roots bilaterally. Facet hypertrophy  and ligament hypertrophy at these areas which were removed decompressing the central canal and nerve roots. The facet was removed exposing the lateral dispense dispensed incised and the space was distracted up to 9 mm the nerve space was prepared for interbody fusion with disproportion broaches curettes and disc removed with pituitary rongeurs. We packed the disc with autograft bone all the bone removed during the laminectomy which was cleaned from soft tissue chart the small pieces. A bullet interbody cage was packed with autograft bone and DBX putty this cage was tapped into position and going across the midline. It first imaging showed good position the cage in both AP and lateral planes. Then, at which point we'll Michael decorticated with high-speed drill placed a probe down the pedicle checked with a small ball probe and sure we could bony circumference tapped the hole and placed 50 lumbar 5 mm wide Expedium pedicle screw is in each side. She screws and cages with a pillow fluoroscopic imaging. The screw heads and using a side connector connected Neurontin to the ulnar rod system from both sides and final tightened all nuts and screws. The screws and with the pedicle and aspirated bone marrow aspirate this was added to the fascia the sponge and we packed with autograft bone and the heel is in bone marrow aspirate in the posterior abductors impression is in the 102 retractors removed we did hemostasis fascia closed with 0 Vicryl interrupted sutures subcutaneous tissue closed with 02 over 0 Vicryl interrupted sutures skin closed benzoin and Steri-Strips. Patient was placed back supine position woken from anesthesia and  PLAN OF CARE: Admit to inpatient   PATIENT DISPOSITION:  PACU - hemodynamically stable.

## 2011-10-12 NOTE — H&P (Signed)
See H& P.

## 2011-10-12 NOTE — Anesthesia Preprocedure Evaluation (Addendum)
Anesthesia Evaluation  Patient identified by MRN, date of birth, ID band Patient awake    Reviewed: Allergy & Precautions, H&P , NPO status , Patient's Chart, lab work & pertinent test results, reviewed documented beta blocker date and time   Airway Mallampati: II TM Distance: >3 FB Neck ROM: full    Dental  (+) Teeth Intact and Dental Advidsory Given   Pulmonary sleep apnea ,  breath sounds clear to auscultation        Cardiovascular hypertension, Pt. on home beta blockers - CHF + dysrhythmias Atrial Fibrillation Rhythm:Regular Rate:Normal     Neuro/Psych negative neurological ROS  negative psych ROS   GI/Hepatic GERD-  Controlled and Medicated,  Endo/Other  negative endocrine ROS  Renal/GU      Musculoskeletal   Abdominal   Peds  Hematology negative hematology ROS (+)   Anesthesia Other Findings   Reproductive/Obstetrics                          Anesthesia Physical Anesthesia Plan  ASA: III  Anesthesia Plan: General   Post-op Pain Management:    Induction: Intravenous  Airway Management Planned: Oral ETT  Additional Equipment:   Intra-op Plan:   Post-operative Plan: Extubation in OR  Informed Consent:   Dental Advisory Given  Plan Discussed with: Anesthesiologist, CRNA and Surgeon  Anesthesia Plan Comments:        Anesthesia Quick Evaluation

## 2011-10-12 NOTE — Interval H&P Note (Signed)
History and Physical Interval Note:  10/12/2011 7:34 AM  [name]  has presented today for surgery, with the diagnosis of Lumbar stenosis, Lumbar radiculopathy  The various methods of treatment have been discussed with the patient and family. After consideration of risks, benefits and other options for treatment, the patient has consented to  Procedure(s) (LRB): POSTERIOR LUMBAR FUSION 1 LEVEL (N/A) as a surgical intervention .  The patients' history has been reviewed, patient examined, no change in status, stable for surgery.  I have reviewed the patients' chart and labs.  Questions were answered to the patient's satisfaction.     Keelin Neville R

## 2011-10-12 NOTE — Anesthesia Postprocedure Evaluation (Signed)
  Anesthesia Post-op Note  Patient: [name]  Procedure(s) Performed: Procedure(s) (LRB): POSTERIOR LUMBAR FUSION 1 LEVEL (N/A)  Patient Location: PACU  Anesthesia Type: General  Level of Consciousness: awake  Airway and Oxygen Therapy: Patient Spontanous Breathing  Post-op Pain: mild  Post-op Assessment: Post-op Vital signs reviewed  Post-op Vital Signs: Reviewed  Complications: No apparent anesthesia complications

## 2011-10-12 NOTE — Transfer of Care (Signed)
Immediate Anesthesia Transfer of Care Note  Patient: [name]  Procedure(s) Performed: Procedure(s) (LRB): POSTERIOR LUMBAR FUSION 1 LEVEL (N/A)  Patient Location: PACU  Anesthesia Type: General  Level of Consciousness: awake, alert  and oriented  Airway & Oxygen Therapy: Patient Spontanous Breathing and Patient connected to nasal cannula oxygen  Post-op Assessment: Report given to PACU RN, Post -op Vital signs reviewed and stable and Patient moving all extremities X 4  Post vital signs: Reviewed and stable  Complications: No apparent anesthesia complications

## 2011-10-13 MED ORDER — BISACODYL 10 MG RE SUPP
10.0000 mg | Freq: Once | RECTAL | Status: AC
  Administered 2011-10-13: 10 mg via RECTAL
  Filled 2011-10-13: qty 1

## 2011-10-13 MED ORDER — MORPHINE SULFATE 2 MG/ML IJ SOLN
1.0000 mg | INTRAMUSCULAR | Status: DC | PRN
  Administered 2011-10-14: 1 mg via INTRAVENOUS
  Filled 2011-10-13: qty 1

## 2011-10-13 MED ORDER — HYDROCODONE-ACETAMINOPHEN 10-325 MG PO TABS
1.0000 | ORAL_TABLET | ORAL | Status: DC | PRN
  Administered 2011-10-15 – 2011-10-16 (×4): 1 via ORAL
  Filled 2011-10-13 (×5): qty 1

## 2011-10-13 MED ORDER — OXYCODONE-ACETAMINOPHEN 5-325 MG PO TABS
1.0000 | ORAL_TABLET | ORAL | Status: DC | PRN
  Administered 2011-10-13 – 2011-10-14 (×2): 2 via ORAL
  Administered 2011-10-14: 1 via ORAL
  Administered 2011-10-17 – 2011-10-19 (×2): 2 via ORAL
  Filled 2011-10-13: qty 2
  Filled 2011-10-13: qty 1
  Filled 2011-10-13 (×4): qty 2

## 2011-10-13 NOTE — Progress Notes (Signed)
Occupational Therapy Evaluation Patient Details Name: [name] MRN: [id number] DOB: [birth year] Today's Date: 10/13/2011 Time: 7829-5621 OT Time Calculation (min): 17 min  OT Assessment / Plan / Recommendation Clinical Impression  Pt s/p L1-L2 PLIF. Will benefit from acute OT to address below problem list in prep for d/c home with spouse.    OT Assessment  Patient needs continued OT Services    Follow Up Recommendations  Supervision/Assistance - 24 hour    Equipment Recommendations  None recommended by OT    Frequency Min 2X/week    Precautions / Restrictions Precautions Precautions: Back Precaution Booklet Issued: Yes (comment) Required Braces or Orthoses: Spinal Brace Spinal Brace: Thoracolumbosacral orthotic;Applied in sitting position Restrictions Weight Bearing Restrictions: No   Pertinent Vitals/Pain     ADL  Grooming: Simulated;Supervision/safety Where Assessed - Grooming: Standing at sink Upper Body Bathing: Simulated;Set up Where Assessed - Upper Body Bathing: Sitting, chair Lower Body Bathing: Minimal assistance;Simulated Where Assessed - Lower Body Bathing: Sit to stand from chair Upper Body Dressing: Simulated;Set up Where Assessed - Upper Body Dressing: Sitting, chair Lower Body Dressing: Performed;Minimal assistance Where Assessed - Lower Body Dressing: Sit to stand from chair Toilet Transfer: Simulated;Minimal assistance Toilet Transfer Method: Stand pivot Toilet Transfer Equipment: Other (comment) (chair) Equipment Used: Back brace;Rolling walker    OT Goals Acute Rehab OT Goals OT Goal Formulation: With patient Time For Goal Achievement: 10/20/11 Potential to Achieve Goals: Good ADL Goals Pt Will Perform Lower Body Bathing: Sit to stand from chair;Sit to stand from bed;with adaptive equipment;with modified independence ADL Goal: Lower Body Bathing - Progress: Goal set today Pt Will Perform Lower Body Dressing: Sit to stand from  chair;Sit to stand from bed;with adaptive equipment;with modified independence ADL Goal: Lower Body Dressing - Progress: Goal set today Pt Will Transfer to Toilet: with modified independence;Ambulation;with DME;3-in-1;Maintaining back safety precautions ADL Goal: Toilet Transfer - Progress: Goal set today Pt Will Perform Toileting - Clothing Manipulation: with modified independence;Sitting on 3-in-1 or toilet;Standing ADL Goal: Toileting - Clothing Manipulation - Progress: Goal set today Pt Will Perform Toileting - Hygiene: with modified independence;Standing at 3-in-1/toilet;Sit to stand from 3-in-1/toilet ADL Goal: Toileting - Hygiene - Progress: Goal set today Pt Will Perform Tub/Shower Transfer: Shower transfer;with supervision;with DME;Ambulation;Shower seat without back ADL Goal: Web designer - Progress: Goal set today Miscellaneous OT Goals Miscellaneous OT Goal #1: Pt will perform bed mobility with mod I in prep for EOB ADLs. OT Goal: Miscellaneous Goal #1 - Progress: Goal set today  Visit Information  Last OT Received On: 10/13/11 Assistance Needed: +1    Subjective Data      Prior Functioning  Home Living Lives With: Spouse Available Help at Discharge: Family;Available 24 hours/day Type of Home: House Home Access: Stairs to enter [employer] of Steps: 3-4 Entrance Stairs-Rails: Right Home Layout: One level Bathroom Shower/Tub: Tub/shower unit;Walk-in shower Bathroom Toilet: Handicapped height Bathroom Accessibility: Yes How Accessible: Accessible via walker Home Adaptive Equipment: Straight cane;Walker - rolling;Bedside commode/3-in-1;Shower chair without back;Reacher Prior Function Level of Independence: Needs assistance Needs Assistance: Bathing Bath: Minimal (washing her back only) Able to Take Stairs?: Yes Driving: Yes Vocation: Retired Musician: No difficulties Dominant Hand: Right    Cognition  Overall Cognitive  Status: Appears within functional limits for tasks assessed/performed Arousal/Alertness: Awake/alert Orientation Level: Oriented X4 / Intact Behavior During Session: [facility] for tasks performed    Extremity/Trunk Assessment Right Upper Extremity Assessment RUE ROM/Strength/Tone: Within functional levels RUE Sensation: WFL - Light Touch;WFL - Proprioception  RUE Coordination: WFL - gross/fine motor Left Upper Extremity Assessment LUE ROM/Strength/Tone: Within functional levels LUE Sensation: WFL - Light Touch;WFL - Proprioception LUE Coordination: WFL - gross/fine motor Right Lower Extremity Assessment RLE ROM/Strength/Tone: WFL for tasks assessed RLE Sensation: WFL - Light Touch Left Lower Extremity Assessment LLE ROM/Strength/Tone: WFL for tasks assessed LLE Sensation: WFL - Light Touch   Mobility Bed Mobility Bed Mobility: Not assessed Transfers Sit to Stand: 4: Min guard;With upper extremity assist;With armrests;From chair/3-in-1 Stand to Sit: 4: Min guard;With upper extremity assist;With armrests;To chair/3-in-1 Details for Transfer Assistance: cues for UE use and to control descent   Exercise    Balance Balance Balance Assessed: No  End of Session OT - End of Session Equipment Utilized During Treatment: Gait belt;Back brace Activity Tolerance: Patient tolerated treatment well Patient left: in chair;with call bell/phone within reach Nurse Communication: Mobility status  10/13/2011 Cipriano Mile OTR/L Pager [phone] Office [phone]  Cipriano Mile 10/13/2011, 10:23 AM

## 2011-10-13 NOTE — Evaluation (Signed)
Physical Therapy Evaluation Patient Details Name: [name] MRN: [id number] DOB: Oct 11, 1940 Today's Date: 10/13/2011 Time: 0810-0830 PT Time Calculation (min): 20 min  PT Assessment / Plan / Recommendation Clinical Impression  pt presents s/p L1-2 PLIF.  pt very motivated and moving well.  Anticipate should make great progress.      PT Assessment  Patient needs continued PT services    Follow Up Recommendations  Home health PT;Supervision - Intermittent    Equipment Recommendations  None recommended by PT    Frequency Min 5X/week    Precautions / Restrictions Precautions Precautions: Back Precaution Booklet Issued: Yes (comment) Required Braces or Orthoses: Spinal Brace Spinal Brace: Thoracolumbosacral orthotic;Applied in sitting position Restrictions Weight Bearing Restrictions: No   Pertinent Vitals/Pain Pain 1/10      Mobility  Bed Mobility Bed Mobility: Not assessed Transfers Transfers: Sit to Stand;Stand to Sit Sit to Stand: 4: Min guard;With upper extremity assist;With armrests;From chair/3-in-1 Stand to Sit: 4: Min guard;With upper extremity assist;With armrests;To chair/3-in-1 Details for Transfer Assistance: cues for UE use and to control descent Ambulation/Gait Ambulation/Gait Assistance: 4: Min guard Ambulation Distance (Feet): 150 Feet Assistive device:  (IV pole) Ambulation/Gait Assistance Details: pt uses IV pole for support.  pt motivated to ambulate, moves slow.   Gait Pattern: Step-through pattern;Decreased stride length;Trunk flexed Stairs: No Wheelchair Mobility Wheelchair Mobility: No    Exercises     PT Goals Acute Rehab PT Goals PT Goal Formulation: With patient Time For Goal Achievement: 10/20/11 Potential to Achieve Goals: Good Pt will Roll Supine to Right Side: Independently PT Goal: Rolling Supine to Right Side - Progress: Goal set today Pt will Roll Supine to Left Side: Independently PT Goal: Rolling Supine to Left Side  - Progress: Goal set today Pt will go Supine/Side to Sit: Independently PT Goal: Supine/Side to Sit - Progress: Goal set today Pt will go Sit to Supine/Side: Independently PT Goal: Sit to Supine/Side - Progress: Goal set today Pt will go Sit to Stand: with modified independence PT Goal: Sit to Stand - Progress: Goal set today Pt will go Stand to Sit: with modified independence PT Goal: Stand to Sit - Progress: Goal set today Pt will Ambulate: >150 feet;with modified independence;with least restrictive assistive device PT Goal: Ambulate - Progress: Goal set today Pt will Go Up / Down Stairs: 3-5 stairs;with modified independence;with rail(s) PT Goal: Up/Down Stairs - Progress: Goal set today Additional Goals Additional Goal #1: pt will verbalize and follow back precautions.   PT Goal: Additional Goal #1 - Progress: Goal set today  Visit Information  Last PT Received On: 10/13/11 Assistance Needed: +1    Subjective Data  Subjective: He said this one was easier.   Patient Stated Goal: Be ready for our 50th anniversary.     Prior Functioning  Home Living Lives With: Spouse Available Help at Discharge: Family;Available 24 hours/day Type of Home: House Home Access: Stairs to enter [employer] of Steps: 3-4 Entrance Stairs-Rails: Right Home Layout: One level Bathroom Shower/Tub: Tub/shower unit;Walk-in shower Bathroom Toilet: Handicapped height Bathroom Accessibility: Yes How Accessible: Accessible via walker Home Adaptive Equipment: Straight cane;Walker - rolling;Bedside commode/3-in-1;Shower chair without back Prior Function Level of Independence: Needs assistance Needs Assistance: Bathing Bath: Minimal (washing her back only) Able to Take Stairs?: Yes Driving: Yes Vocation: Retired Musician: No difficulties Dominant Hand: Right    Cognition  Overall Cognitive Status: Appears within functional limits for tasks  assessed/performed Arousal/Alertness: Awake/alert Orientation Level: Oriented X4 / Intact Behavior  During Session: [facility] for tasks performed    Extremity/Trunk Assessment Right Lower Extremity Assessment RLE ROM/Strength/Tone: [facility] for tasks assessed RLE Sensation: WFL - Light Touch Left Lower Extremity Assessment LLE ROM/Strength/Tone: WFL for tasks assessed LLE Sensation: WFL - Light Touch   Balance Balance Balance Assessed: No  End of Session PT - End of Session Equipment Utilized During Treatment: Gait belt;Back brace Activity Tolerance: Patient tolerated treatment well Patient left: in chair;with call bell/phone within reach Nurse Communication: Mobility status   Sunny Schlein, [place] [phone] 10/13/2011, 8:41 AM

## 2011-10-13 NOTE — Progress Notes (Signed)
Doing well. C/o appropriate incisional soreness. Less leg pain  Amb well  Temp:  [96.6 F (35.9 C)-98.1 F (36.7 C)] 98.1 F (36.7 C) (05/08 0640) Pulse Rate:  [65-98] 98  (05/08 0640) Resp:  [11-20] 18  (05/08 0738) BP: (120-150)/(55-78) 138/76 mmHg (05/08 0640) SpO2:  [94 %-100 %] 99 % (05/08 0738) FiO2 (%):  [100 %] 100 % (05/07 1127) Good strength and sensation Incision CDI  Plan: Increase activity

## 2011-10-13 NOTE — Progress Notes (Signed)
UR COMPLETED  

## 2011-10-14 ENCOUNTER — Inpatient Hospital Stay (HOSPITAL_COMMUNITY): Payer: Medicare Other

## 2011-10-14 ENCOUNTER — Encounter (HOSPITAL_COMMUNITY): Payer: Self-pay | Admitting: Gastroenterology

## 2011-10-14 LAB — CBC
HCT: 36.3 % (ref 36.0–46.0)
Hemoglobin: 11.9 g/dL — ABNORMAL LOW (ref 12.0–15.0)
MCH: 27.7 pg (ref 26.0–34.0)
MCHC: 32.8 g/dL (ref 30.0–36.0)
MCV: 84.6 fL (ref 78.0–100.0)
Platelets: 129 10*3/uL — ABNORMAL LOW (ref 150–400)
RBC: 4.29 MIL/uL (ref 3.87–5.11)
RDW: 15.5 % (ref 11.5–15.5)
WBC: 3.3 10*3/uL — ABNORMAL LOW (ref 4.0–10.5)

## 2011-10-14 LAB — BASIC METABOLIC PANEL
BUN: 20 mg/dL (ref 6–23)
CO2: 26 mEq/L (ref 19–32)
Calcium: 8.8 mg/dL (ref 8.4–10.5)
Chloride: 98 mEq/L (ref 96–112)
Creatinine, Ser: 1.01 mg/dL (ref 0.50–1.10)
GFR calc Af Amer: 65 mL/min — ABNORMAL LOW (ref >90)
GFR calc non Af Amer: 56 mL/min — ABNORMAL LOW (ref >90)
Glucose, Bld: 148 mg/dL — ABNORMAL HIGH (ref 70–99)
Potassium: 4.3 mEq/L (ref 3.5–5.1)
Sodium: 137 mEq/L (ref 135–145)

## 2011-10-14 LAB — PHOSPHORUS: Phosphorus: 3.6 mg/dL (ref 2.3–4.6)

## 2011-10-14 MED ORDER — FLEET ENEMA 7-19 GM/118ML RE ENEM
1.0000 | ENEMA | Freq: Once | RECTAL | Status: AC
  Administered 2011-10-14: 1 via RECTAL
  Filled 2011-10-14: qty 1

## 2011-10-14 MED ORDER — METOCLOPRAMIDE HCL 5 MG/ML IJ SOLN
10.0000 mg | Freq: Four times a day (QID) | INTRAMUSCULAR | Status: AC
  Administered 2011-10-14 – 2011-10-15 (×4): 10 mg via INTRAVENOUS
  Filled 2011-10-14 (×5): qty 2

## 2011-10-14 MED ORDER — POLYETHYLENE GLYCOL 3350 17 G PO PACK
17.0000 g | PACK | Freq: Two times a day (BID) | ORAL | Status: DC
  Administered 2011-10-15 – 2011-10-19 (×5): 17 g via ORAL
  Filled 2011-10-14 (×11): qty 1

## 2011-10-14 MED ORDER — PANTOPRAZOLE SODIUM 40 MG IV SOLR
40.0000 mg | Freq: Two times a day (BID) | INTRAVENOUS | Status: DC
  Administered 2011-10-14 – 2011-10-18 (×8): 40 mg via INTRAVENOUS
  Filled 2011-10-14 (×9): qty 40

## 2011-10-14 NOTE — Progress Notes (Signed)
Physical Therapy Treatment Patient Details Name: [name] MRN: [id number] DOB: 1941-02-06 Today's Date: 10/14/2011 Time: 2841-3244 PT Time Calculation (min): 14 min  PT Assessment / Plan / Recommendation Comments on Treatment Session  Patient eager to attempt ambulation this afternoon after not feeling well this morning. Utilized RW for increased balance. Patient still burping and feeling nausea but was much more alert this afternoon    Follow Up Recommendations  Home health PT;Supervision - Intermittent    Barriers to Discharge        Equipment Recommendations  None recommended by PT    Recommendations for Other Services    Frequency Min 5X/week   Plan Discharge plan remains appropriate;Frequency remains appropriate    Precautions / Restrictions Precautions Precautions: Back Precaution Comments: Patient able to recall all precautions Spinal Brace: Thoracolumbosacral orthotic;Applied in sitting position   Pertinent Vitals/Pain 3/10 back pain. Medicated after therapy session     Mobility  Transfers Sit to Stand: 4: Min assist;With upper extremity assist;With armrests;From chair/3-in-1 Stand to Sit: To chair/3-in-1;With upper extremity assist;With armrests;4: Min assist Details for Transfer Assistance: A for balance. Cues for safe positioning prior to sitting and for safe hand placement Ambulation/Gait Ambulation/Gait Assistance: 4: Min assist Ambulation Distance (Feet): 120 Feet Assistive device: Rolling walker Ambulation/Gait Assistance Details: Cues for safe positioning of RW and to stand upright. Cues to increase step length.  Gait Pattern: Step-through pattern;Decreased stride length Gait velocity: decreased    Exercises     PT Diagnosis:    PT Problem List:   PT Treatment Interventions:     PT Goals Acute Rehab PT Goals PT Goal: Sit to Stand - Progress: Progressing toward goal PT Goal: Stand to Sit - Progress: Progressing toward goal PT Goal:  Ambulate - Progress: Progressing toward goal Additional Goals PT Goal: Additional Goal #1 - Progress: Progressing toward goal  Visit Information  Last PT Received On: 10/14/11 Assistance Needed: +1    Subjective Data  Subjective: I feel better than I did this morning   Cognition  Overall Cognitive Status: Appears within functional limits for tasks assessed/performed Arousal/Alertness: Awake/alert Orientation Level: Appears intact for tasks assessed Behavior During Session: [facility] for tasks performed    Balance     End of Session PT - End of Session Equipment Utilized During Treatment: Gait belt;Back brace Activity Tolerance: Patient tolerated treatment well Patient left: in chair;with call bell/phone within reach    Fredrich Birks 10/14/2011, 2:54 PM 10/14/2011 Fredrich Birks PTA [phone] pager [phone] office

## 2011-10-14 NOTE — Progress Notes (Signed)
Occupational Therapy Treatment Patient Details Name: [name] MRN: [id number] DOB: 1940-08-01 Today's Date: 10/14/2011 Time: 4098-1191 OT Time Calculation (min): 28 min  OT Assessment / Plan / Recommendation Comments on Treatment Session Focus of treatment session on AE education.  Pt with nausea during session and fatigued from being up in chair.    Follow Up Recommendations  Supervision/Assistance - 24 hour    Barriers to Discharge       Equipment Recommendations  None recommended by OT    Recommendations for Other Services    Frequency Min 2X/week   Plan Discharge plan remains appropriate    Precautions / Restrictions Precautions Precautions: Back Precaution Comments: Patient able to recall all precautions Required Braces or Orthoses: Spinal Brace Spinal Brace: Thoracolumbosacral orthotic;Applied in sitting position   Pertinent Vitals/Pain NA    ADL  Lower Body Bathing: Simulated;Supervision/safety Where Assessed - Lower Body Bathing: Sitting, chair Lower Body Dressing: Performed;Supervision/safety Where Assessed - Lower Body Dressing: Sitting, chair Toilet Transfer: Performed;Minimal assistance Toilet Transfer Method: Ambulating Toilet Transfer Equipment: Bedside commode Toileting - Clothing Manipulation: Performed;Minimal assistance Where Assessed - Toileting Clothing Manipulation: Standing Equipment Used: Back brace;Rolling walker;Reacher;Long-handled sponge;Sock aid Ambulation Related to ADLs: Ambulated from chair to 3n1 with min assist and RW ADL Comments: Pt performed toilet transfer but was unable to void. Educated pt on use of AE for LB bathing/dressing.  Pt donned sock with sock aid but reports she would prefer her husband assist with socks and shoes.Supervision for safe use of AE to ensure adherence to back precautions.    OT Diagnosis:    OT Problem List:   OT Treatment Interventions:     OT Goals ADL Goals Pt Will Perform Lower Body Bathing:  Sit to stand from chair;Sit to stand from bed;with adaptive equipment;with modified independence ADL Goal: Lower Body Bathing - Progress: Progressing toward goals Pt Will Perform Lower Body Dressing: Sit to stand from chair;Sit to stand from bed;with adaptive equipment;with modified independence ADL Goal: Lower Body Dressing - Progress: Progressing toward goals Pt Will Transfer to Toilet: with modified independence;Ambulation;with DME;3-in-1;Maintaining back safety precautions ADL Goal: Toilet Transfer - Progress: Progressing toward goals Pt Will Perform Toileting - Clothing Manipulation: with modified independence;Sitting on 3-in-1 or toilet;Standing ADL Goal: Toileting - Clothing Manipulation - Progress: Progressing toward goals Miscellaneous OT Goals Miscellaneous OT Goal #1: Pt will perform bed mobility with mod I in prep for EOB ADLs. OT Goal: Miscellaneous Goal #1 - Progress: Progressing toward goals  Visit Information  Last OT Received On: 10/14/11 Assistance Needed: +1    Subjective Data      Prior Functioning       Cognition  Overall Cognitive Status: Appears within functional limits for tasks assessed/performed Arousal/Alertness: Lethargic Orientation Level: Appears intact for tasks assessed Behavior During Session: [name] for tasks performed Cognition - Other Comments: Pt lethargic during session but still able to verbalize and return demonstration of AE and back precautions.    Mobility Bed Mobility Bed Mobility: Sit to Sidelying Left Sit to Sidelying Left: 4: Min assist;With rail;HOB flat Details for Bed Mobility Assistance: Assist to LE to lift them into bed. Transfers Transfers: Sit to Stand;Stand to Sit Sit to Stand: 4: Min assist;From chair/3-in-1;With armrests;With upper extremity assist Stand to Sit: 4: Min assist;To chair/3-in-1;To bed;With armrests;With upper extremity assist Details for Transfer Assistance: Assist to intiate stand and for safe hand placement.     Exercises    Balance    End of Session OT - End of  Session Equipment Utilized During Treatment: Gait belt;Back brace Activity Tolerance: Patient limited by fatigue Patient left: in bed;with call bell/phone within reach;with bed alarm set Nurse Communication: Mobility status  10/14/2011 Cipriano Mile OTR/L Pager [phone] Office [phone]   Cipriano Mile 10/14/2011, 4:39 PM

## 2011-10-14 NOTE — Progress Notes (Signed)
OT Cancellation Note  Treatment cancelled today due to medical issues with patient which prohibited therapy. Pt just finished with PT session and experiencing significant nausea.  Will re-attempt this afternoon.   10/14/2011 Cipriano Mile OTR/L Pager [phone] Office [phone]

## 2011-10-14 NOTE — Progress Notes (Signed)
Physical Therapy Treatment Patient Details Name: [name] MRN: [id number] DOB: 1940/07/28 Today's Date: 10/14/2011 Time: 0935-1000 PT Time Calculation (min): 25 min  PT Assessment / Plan / Recommendation Comments on Treatment Session  Patient not progressing with therapy due to nausea and cognitive changes. RN notified and stated that patient given nausea medication and that she had not seen any evidence of patient with emesis. Patient stated that she hasn't been able to eat so ? if that is what is causing some decreased strength. Patient not seeming very motivated this session. Would like to attempt to see patient again this afternoon to a    Follow Up Recommendations  Home health PT;Supervision - Intermittent    Barriers to Discharge        Equipment Recommendations  None recommended by PT    Recommendations for Other Services    Frequency Min 5X/week   Plan Discharge plan remains appropriate;Frequency remains appropriate    Precautions / Restrictions Precautions Precautions: Back Spinal Brace: Thoracolumbosacral orthotic;Applied in sitting position Restrictions Weight Bearing Restrictions: No   Pertinent Vitals/Pain Patient limited by nausea    Mobility  Bed Mobility Bed Mobility: Left Sidelying to Sit Left Sidelying to Sit: 3: Mod assist;HOB flat;With rails Details for Bed Mobility Assistance: A for LE positioning and to elevate shoulder up from sidelying to sitting position. Patient "freezing" midway requiring cues to complete. Cues given throughout to protect precautions Transfers Transfers: Stand Pivot Transfers Sit to Stand: 4: Min assist;With upper extremity assist;From bed Stand to Sit: To chair/3-in-1;With upper extremity assist;With armrests;4: Min assist Stand Pivot Transfers: 4: Min assist Details for Transfer Assistance: A to initiate stand and to control sitting. Cues for safe technique throughout. A for safe use of RW with pivot    Exercises      PT Diagnosis:    PT Problem List:   PT Treatment Interventions:     PT Goals Acute Rehab PT Goals PT Goal: Rolling Supine to Left Side - Progress: Not progressing PT Goal: Supine/Side to Sit - Progress: Not progressing PT Goal: Sit to Stand - Progress: Progressing toward goal PT Goal: Stand to Sit - Progress: Progressing toward goal PT Goal: Ambulate - Progress: Not progressing Additional Goals PT Goal: Additional Goal #1 - Progress: Not progressing  Visit Information  Last PT Received On: 10/14/11 Assistance Needed: +1    Subjective Data  Subjective: I did so much better yesterday   Cognition  Overall Cognitive Status: Impaired Area of Impairment: Following commands;Attention Arousal/Alertness: Lethargic Orientation Level: Appears intact for tasks assessed Behavior During Session: Lethargic Current Attention Level: Sustained Following Commands: Follows one step commands with increased time;Follows multi-step commands inconsistently Cognition - Other Comments: Patient is slightly lethargic due to increase nausea and complaints of not sleeping well    Balance     End of Session PT - End of Session Equipment Utilized During Treatment: Gait belt;Back brace Activity Tolerance: Treatment limited secondary to medical complications (Comment) (Patient with increased nausea) Patient left: in chair;with call bell/phone within reach Nurse Communication: Mobility status    Fredrich Birks 10/14/2011, 10:53 AM 10/14/2011 Fredrich Birks PTA [phone] pager [phone] office

## 2011-10-14 NOTE — Progress Notes (Signed)
C/o minimal incisional soreness, but not feeling well, feels generally weak No sig leg pain No Numbness, tingling, weakness + Nausea /vomiting Amb/ voiding yesterday - not up much today  Temp:  [97.4 F (36.3 C)-99.8 F (37.7 C)] 99.8 F (37.7 C) (05/09 0957) Pulse Rate:  [75-124] 117  (05/09 0957) Resp:  [18-20] 18  (05/09 0957) BP: (84-147)/(53-84) 147/84 mmHg (05/09 0957) SpO2:  [92 %-98 %] 92 % (05/09 0957) Good strength and sensation Incision CDI  Plan: Bowel care - increase activity , check cbc, BMP

## 2011-10-14 NOTE — Progress Notes (Signed)
Reassessed patient for further nausea. Pt. Reports no more nausea at this time.  Up to [facility] with no difficulty at this time. Back dressing has small of blood leaking onto bed linens.

## 2011-10-14 NOTE — Consult Note (Signed)
Reason for Consult: Postop ileus Referring Physician: Jazlyne Gauger is an 71 y.o. female.  HPI: Patient with a history of postop ileus with her last back surgery although she did not have that problem with previous surgery and underwent back surgery 2 days ago and has had some increased gas bloating and belching whatever she did have 2 bowel movements today and we were consult for further workup and plans. She sees my partner Dr. Matthias Hughs and has chronic constipation but only needs stool softeners at home but does have chronic gas and periodically takes probiotics and last surgery she did need an NG tube for a few days but other than diverticuli nothing else runs in the family and she has no other complaint Past Medical History  Diagnosis Date  . CHF (congestive heart failure)   . PAF (paroxysmal atrial fibrillation)   . Hypertension   . DJD (degenerative joint disease)   . Allergic rhinitis   . Hyperlipidemia   . Lumbar stenosis   . Spondylosis   . Scoliosis     with radiculopathy L2-S1 with prior surgery  . Small bowel obstruction     versus ileus after last bck surgery  . Sleep apnea     no cpap now  . GERD (gastroesophageal reflux disease)     Past Surgical History  Procedure Date  . Septoplasty 80's  . Total abdominal hysterectomy   . Lipoma excision     bil  . Lumbar disc surgery     x2   spinal leak  once   . Repair spinal fluid leak 10  . Cataract extraction 11    bil  . Eye surgery     Family History  Problem Relation Age of Onset  . Arthritis Mother   . Heart attack Father   . Hypertension Sister   . Asthma Sister   . Allergies Sister     Social History:  reports that she has never smoked. She does not have any smokeless tobacco history on file. She reports that she does not drink alcohol or use illicit drugs.  Allergies: No Known Allergies  Medications: I have reviewed the patient's current medications.  Results for orders placed during the  hospital encounter of 10/12/11 (from the past 48 hour(s))  CBC     Status: Abnormal   Collection Time   10/14/11 12:51 PM      Component Value Range Comment   WBC 3.3 (*) 4.0 - 10.5 (K/uL)    RBC 4.29  3.87 - 5.11 (MIL/uL)    Hemoglobin 11.9 (*) 12.0 - 15.0 (g/dL)    HCT 45.4  09.8 - 11.9 (%)    MCV 84.6  78.0 - 100.0 (fL)    MCH 27.7  26.0 - 34.0 (pg)    MCHC 32.8  30.0 - 36.0 (g/dL)    RDW 14.7  82.9 - 56.2 (%)    Platelets 129 (*) 150 - 400 (K/uL)   BASIC METABOLIC PANEL     Status: Abnormal   Collection Time   10/14/11 12:51 PM      Component Value Range Comment   Sodium 137  135 - 145 (mEq/L)    Potassium 4.3  3.5 - 5.1 (mEq/L)    Chloride 98  96 - 112 (mEq/L)    CO2 26  19 - 32 (mEq/L)    Glucose, Bld 148 (*) 70 - 99 (mg/dL)    BUN 20  6 - 23 (mg/dL)    Creatinine,  Ser 1.01  0.50 - 1.10 (mg/dL)    Calcium 8.8  8.4 - 10.5 (mg/dL)    GFR calc non Af Amer 56 (*) >90 (mL/min)    GFR calc Af Amer 65 (*) >90 (mL/min)     No results found.  ROS negative except above Blood pressure 116/73, pulse 110, temperature 98.3 F (36.8 C), temperature source Oral, resp. rate 18, height 5\' 6"  (1.676 m), weight 88.451 kg (195 lb), SpO2 93.00%. Physical Exam vital signs stable afebrile no acute distress although uncomfortable abdomen is soft decreased but occasional bowel sound somewhat distended no guarding or rebound nontender currently no x-ray done white count okay electrolytes okay  Assessment/Plan: Probable postop ileus Plan: Will check an x-ray and repeat labs in the a.m. and consider NG tube when necessary and in the meantime try to minimize pain medicine increase activity and correct electrolytes if need be and will check on tomorrow and call us sooner when necessary  Verenise Moulin E 10/14/2011, 6:05 PM

## 2011-10-14 NOTE — Progress Notes (Signed)
Pt now NPO due to ilieus. Pt complaining of restless legs, states that she takes Neurontin to control it. Dr. Yetta Barre called regarding PO med. Order given to allow pt to take PO Neurontin with sip of water only. Will continue to monitor pt for nausea or vomiting. Salvadore Oxford, RN 10/14/11

## 2011-10-14 NOTE — Progress Notes (Signed)
Patient reports three "large mouthfuls of brown stuff"; patient thinking she has ileus.  Medicated with dose of Zofran and left patient running on her IV fluids X two hours.  Will reassess patient in an hour.

## 2011-10-15 LAB — CBC
HCT: 34.1 % — ABNORMAL LOW (ref 36.0–46.0)
Hemoglobin: 11.3 g/dL — ABNORMAL LOW (ref 12.0–15.0)
MCH: 27.8 pg (ref 26.0–34.0)
MCHC: 33.1 g/dL (ref 30.0–36.0)
MCV: 83.8 fL (ref 78.0–100.0)
Platelets: 139 10*3/uL — ABNORMAL LOW (ref 150–400)
RBC: 4.07 MIL/uL (ref 3.87–5.11)
RDW: 15.2 % (ref 11.5–15.5)
WBC: 3.3 10*3/uL — ABNORMAL LOW (ref 4.0–10.5)

## 2011-10-15 LAB — BASIC METABOLIC PANEL
BUN: 19 mg/dL (ref 6–23)
CO2: 26 mEq/L (ref 19–32)
Calcium: 8.9 mg/dL (ref 8.4–10.5)
Chloride: 100 mEq/L (ref 96–112)
Creatinine, Ser: 0.93 mg/dL (ref 0.50–1.10)
GFR calc Af Amer: 72 mL/min — ABNORMAL LOW (ref >90)
GFR calc non Af Amer: 62 mL/min — ABNORMAL LOW (ref >90)
Glucose, Bld: 111 mg/dL — ABNORMAL HIGH (ref 70–99)
Potassium: 3.5 mEq/L (ref 3.5–5.1)
Sodium: 138 mEq/L (ref 135–145)

## 2011-10-15 LAB — DIFFERENTIAL
Basophils Absolute: 0 10*3/uL (ref 0.0–0.1)
Basophils Relative: 0 % (ref 0–1)
Eosinophils Absolute: 0 10*3/uL (ref 0.0–0.7)
Eosinophils Relative: 1 % (ref 0–5)
Lymphocytes Relative: 16 % (ref 12–46)
Lymphs Abs: 0.5 10*3/uL — ABNORMAL LOW (ref 0.7–4.0)
Monocytes Absolute: 0.5 10*3/uL (ref 0.1–1.0)
Monocytes Relative: 15 % — ABNORMAL HIGH (ref 3–12)
Neutro Abs: 2.3 10*3/uL (ref 1.7–7.7)
Neutrophils Relative %: 68 % (ref 43–77)

## 2011-10-15 LAB — MAGNESIUM: Magnesium: 2.2 mg/dL (ref 1.5–2.5)

## 2011-10-15 MED ORDER — KCL IN DEXTROSE-NACL 20-5-0.45 MEQ/L-%-% IV SOLN
INTRAVENOUS | Status: DC
  Administered 2011-10-15 – 2011-10-16 (×2): via INTRAVENOUS
  Administered 2011-10-17: 1000 mL via INTRAVENOUS
  Administered 2011-10-17 – 2011-10-18 (×2): via INTRAVENOUS
  Filled 2011-10-15 (×8): qty 1000

## 2011-10-15 MED ORDER — POTASSIUM CHLORIDE 10 MEQ/100ML IV SOLN
10.0000 meq | INTRAVENOUS | Status: AC
  Administered 2011-10-15: 10 meq via INTRAVENOUS
  Filled 2011-10-15 (×2): qty 100

## 2011-10-15 MED FILL — Heparin Sodium (Porcine) Inj 1000 Unit/ML: INTRAMUSCULAR | Qty: 30 | Status: AC

## 2011-10-15 MED FILL — Sodium Chloride IV Soln 0.9%: INTRAVENOUS | Qty: 1000 | Status: AC

## 2011-10-15 NOTE — Progress Notes (Signed)
Physical Therapy Treatment Patient Details Name: [name] MRN: [id number] DOB: 1941-04-11 Today's Date: 10/15/2011 Time: 6578-4696 PT Time Calculation (min): 24 min  PT Assessment / Plan / Recommendation Comments on Treatment Session  Patient progressing much better today with ambulation. Patient more alert/awake today. Husband present throughout session. Will need to attempt stairs next session as she politely declined stair education today.     Follow Up Recommendations  Home health PT;Supervision - Intermittent    Barriers to Discharge        Equipment Recommendations  None recommended by PT    Recommendations for Other Services    Frequency Min 5X/week   Plan Discharge plan remains appropriate    Precautions / Restrictions Precautions Precautions: Back Precaution Booklet Issued: Yes (comment) Precaution Comments: Patient able to recall all precautions. Still requiring cues with mobility. Patient requires A to don TLSO Required Braces or Orthoses: Spinal Brace Spinal Brace: Thoracolumbosacral orthotic;Applied in sitting position   Pertinent Vitals/Pain 4/10 back pain. RN made aware. Patient repositioned    Mobility  Bed Mobility Left Sidelying to Sit: 5: Supervision Details for Bed Mobility Assistance: Cues for safe log roll technique. Patient has tendency to "drop" LEs too quickly.  Transfers Sit to Stand: 4: Min guard;With upper extremity assist;With armrests;From bed;From chair/3-in-1 Stand to Sit: 4: Min guard;With upper extremity assist;With armrests;To chair/3-in-1 Details for Transfer Assistance: Cues for safe hand placement and to control descent onto chair Ambulation/Gait Ambulation/Gait Assistance: 4: Min guard Ambulation Distance (Feet): 250 Feet Assistive device: Rolling walker Ambulation/Gait Assistance Details: Cues for positioning of RW and to increase step length Gait Pattern: Decreased stride length;Step-through pattern    Exercises      PT Diagnosis:    PT Problem List:   PT Treatment Interventions:     PT Goals Acute Rehab PT Goals PT Goal: Supine/Side to Sit - Progress: Progressing toward goal PT Goal: Sit to Stand - Progress: Progressing toward goal PT Goal: Stand to Sit - Progress: Progressing toward goal PT Goal: Ambulate - Progress: Progressing toward goal Additional Goals PT Goal: Additional Goal #1 - Progress: Progressing toward goal  Visit Information  Last PT Received On: 10/15/11 Assistance Needed: +1    Subjective Data  Subjective: "If I had to choose between a shooting lineup or walking yesterday, I would have picked the shooting lineup"   Cognition  Overall Cognitive Status: Appears within functional limits for tasks assessed/performed Arousal/Alertness: Awake/alert Orientation Level: Appears intact for tasks assessed Behavior During Session: [facility] for tasks performed    Balance     End of Session PT - End of Session Equipment Utilized During Treatment: Gait belt;Back brace Activity Tolerance: Patient tolerated treatment well Patient left: in chair;with call bell/phone within reach;with family/visitor present    Fredrich Birks 10/15/2011, 10:07 AM 10/15/2011 Fredrich Birks PTA [phone] pager [phone] office

## 2011-10-15 NOTE — Progress Notes (Signed)
[name] 10:12 AM  Subjective: The patient is doing better than yesterday and she did vomit a few times last night and did have a bowel movement as well and she is getting out of bed and walking to the bathroom and has no new complaints Objective: Vital signs stable afebrile looking a little better than yesterday abdomen is still distended but soft with mild discomfort throughout but no guarding or rebound and occasional bowel sound white count and electrolytes okay and x-ray reviewed worrisome for postop ileus versus SBO Assessment: Postop ileus versus SBO seemingly improved  Plan: Increase potassium to greater than 4 place NG tube when necessary minimize pain medicine increase activity as much as possible  [name] E

## 2011-10-15 NOTE — Progress Notes (Signed)
.   C/o appropriate incisional soreness. No sig leg pain No Numbness, tingling, weakness + Nausea /vomiting Amb/ voiding fair  Temp:  [98.3 F (36.8 C)-99.8 F (37.7 C)] 98.4 F (36.9 C) (05/10 0600) Pulse Rate:  [86-117] 102  (05/10 0600) Resp:  [18-19] 18  (05/10 0600) BP: (100-147)/(53-84) 121/53 mmHg (05/10 0600) SpO2:  [91 %-95 %] 94 % (05/10 0600) Good strength and sensation Incision CDI  Plan: Increase activity - appreciate GI input

## 2011-10-16 ENCOUNTER — Inpatient Hospital Stay (HOSPITAL_COMMUNITY): Payer: Medicare Other

## 2011-10-16 LAB — DIFFERENTIAL
Basophils Absolute: 0 10*3/uL (ref 0.0–0.1)
Basophils Relative: 0 % (ref 0–1)
Eosinophils Absolute: 0.2 10*3/uL (ref 0.0–0.7)
Eosinophils Relative: 4 % (ref 0–5)
Lymphocytes Relative: 23 % (ref 12–46)
Lymphs Abs: 1.1 10*3/uL (ref 0.7–4.0)
Monocytes Absolute: 0.8 10*3/uL (ref 0.1–1.0)
Monocytes Relative: 15 % — ABNORMAL HIGH (ref 3–12)
Neutro Abs: 2.8 10*3/uL (ref 1.7–7.7)
Neutrophils Relative %: 58 % (ref 43–77)

## 2011-10-16 LAB — CBC
HCT: 34.6 % — ABNORMAL LOW (ref 36.0–46.0)
Hemoglobin: 11.1 g/dL — ABNORMAL LOW (ref 12.0–15.0)
MCH: 27.7 pg (ref 26.0–34.0)
MCHC: 32.1 g/dL (ref 30.0–36.0)
MCV: 86.3 fL (ref 78.0–100.0)
Platelets: 155 10*3/uL (ref 150–400)
RBC: 4.01 MIL/uL (ref 3.87–5.11)
RDW: 15.1 % (ref 11.5–15.5)
WBC: 4.9 10*3/uL (ref 4.0–10.5)

## 2011-10-16 LAB — BASIC METABOLIC PANEL
BUN: 16 mg/dL (ref 6–23)
CO2: 24 mEq/L (ref 19–32)
Calcium: 8.6 mg/dL (ref 8.4–10.5)
Chloride: 102 mEq/L (ref 96–112)
Creatinine, Ser: 1.01 mg/dL (ref 0.50–1.10)
GFR calc Af Amer: 65 mL/min — ABNORMAL LOW (ref >90)
GFR calc non Af Amer: 56 mL/min — ABNORMAL LOW (ref >90)
Glucose, Bld: 86 mg/dL (ref 70–99)
Potassium: 3.6 mEq/L (ref 3.5–5.1)
Sodium: 137 mEq/L (ref 135–145)

## 2011-10-16 MED ORDER — WHITE PETROLATUM GEL
Status: AC
  Administered 2011-10-16: 17:00:00
  Filled 2011-10-16: qty 5

## 2011-10-16 NOTE — Progress Notes (Signed)
Pt experiencing frequent liquid BMs, majority BMs were incontinent. Pt started to complain of soreness in anal area and hemorrhoid irritation. Area around anus very red and irritated. Barrier cream placed on area. Dr. Newell Coral called regarding frequent incontinent BMs, RN advised to contact GI MD on call. Dr. Madilyn Fireman contacted and order for flexiseal was requested to keep skin from irritation. After order obtained, BM frequency drastically decreased. Pt only had one very small incontinent BM from 0001 to 0400. Pt states that area around anus "feels much better". Due to change in BM frequency flexiseal was not placed. RN will continue to monitor BMs and pt's skin to determine if flexiseal will be needed.  Salvadore Oxford, RN 10/16/11

## 2011-10-16 NOTE — Progress Notes (Signed)
Doing well. Feels much better  Amb/ voiding well +BM yesterday  Temp:  [98 F (36.7 C)-99.4 F (37.4 C)] 98.2 F (36.8 C) (05/11 0216) Pulse Rate:  [89-110] 89  (05/11 0216) Resp:  [16-20] 16  (05/11 0216) BP: (99-127)/(63-77) 99/63 mmHg (05/11 0216) SpO2:  [91 %-96 %] 91 % (05/11 0216) Good strength and sensation Incision CDI  KUB - improving  Plan: Resolving ileus , would like to resume diet and d/c soon - but will await GI recs

## 2011-10-16 NOTE — Progress Notes (Signed)
Physical Therapy Treatment Patient Details Name: [name] MRN: [id number] DOB: [birth year] Today's Date: 10/16/2011 Time: 4098-1191 PT Time Calculation (min): 77 min  PT Assessment / Plan / Recommendation Comments on Treatment Session  Pt near baseline functional level, although still moving at a slow pace. Pt able to complete stairs this session, requiring only supervision.    Follow Up Recommendations  Home health PT;Supervision - Intermittent    Barriers to Discharge        Equipment Recommendations  None recommended by PT    Recommendations for Other Services    Frequency Min 5X/week   Plan Discharge plan remains appropriate;Frequency remains appropriate    Precautions / Restrictions Precautions Precautions: Back Precaution Booklet Issued: Yes (comment) Precaution Comments: Pt able to recall and maintain 3/3 back precautions Required Braces or Orthoses: Spinal Brace Spinal Brace: Thoracolumbosacral orthotic;Applied in sitting position Restrictions Weight Bearing Restrictions: No       Mobility  Bed Mobility Bed Mobility: Rolling Left;Left Sidelying to Sit;Sitting - Scoot to Edge of Bed Rolling Left: 6: Modified independent (Device/Increase time) Left Sidelying to Sit: 6: Modified independent (Device/Increase time) Sitting - Scoot to Edge of Bed: 6: Modified independent (Device/Increase time) Transfers Transfers: Sit to Stand;Stand to Sit Sit to Stand: 5: Supervision Stand to Sit: 5: Supervision Details for Transfer Assistance: VC for hand placement Ambulation/Gait Ambulation/Gait Assistance: 5: Supervision Ambulation Distance (Feet): 300 Feet Assistive device: Rolling walker Ambulation/Gait Assistance Details: VC for increased heel strike on the left as well as safety with distance to RW Gait Pattern: Decreased stride length;Step-through pattern;Decreased dorsiflexion - left Gait velocity: decreased Stairs: Yes Stairs Assistance: 5: Supervision Stairs  Assistance Details (indicate cue type and reason): VC for sequencing and safety on stairs. No physical assist needed Stair Management Technique: Two rails;Step to pattern;Forwards Number of Stairs: 5  Wheelchair Mobility Wheelchair Mobility: No     PT Goals Acute Rehab PT Goals PT Goal: Rolling Supine to Right Side - Progress: Progressing toward goal PT Goal: Rolling Supine to Left Side - Progress: Progressing toward goal PT Goal: Supine/Side to Sit - Progress: Progressing toward goal PT Goal: Sit to Supine/Side - Progress: Progressing toward goal PT Goal: Sit to Stand - Progress: Progressing toward goal PT Goal: Stand to Sit - Progress: Progressing toward goal PT Goal: Ambulate - Progress: Progressing toward goal PT Goal: Up/Down Stairs - Progress: Progressing toward goal Additional Goals PT Goal: Additional Goal #1 - Progress: Met  Visit Information  Last PT Received On: 10/16/11 Assistance Needed: +1    Subjective Data  Subjective: "I feel like a whole new person."   Cognition  Overall Cognitive Status: Appears within functional limits for tasks assessed/performed Arousal/Alertness: Awake/alert Orientation Level: Appears intact for tasks assessed Behavior During Session: [facility] for tasks performed    Balance     End of Session PT - End of Session Equipment Utilized During Treatment: Gait belt;Back brace Activity Tolerance: Patient tolerated treatment well Patient left: in chair;with call bell/phone within reach;with family/visitor present Nurse Communication: Mobility status    Milana Kidney 10/16/2011, 8:43 AM  10/16/2011 Milana Kidney DPT PAGER: [phone] OFFICE: [phone]

## 2011-10-16 NOTE — Progress Notes (Signed)
[facility] Progress Note  Subjective:  The patient feels much better today after having several bowel movements last night and would like to try to eat. Her abdomen feels softer  Objective: Vital signs in last 24 hours: Temp:  [98 F (36.7 C)-99.4 F (37.4 C)] 98.2 F (36.8 C) 2022-11-03 0216) Pulse Rate:  [89-110] 89  11/03/22 0216) Resp:  [16-20] 16  11-03-2022 0216) BP: (99-127)/(63-77) 99/63 mmHg 11/03/2022 0216) SpO2:  [91 %-96 %] 91 % 11-03-2022 0216) Weight change:    PE: Abdomen is fairly soft nontender  Lab Results: Results for orders placed during the hospital encounter of 10/12/11 (from the past 24 hour(s))  CBC     Status: Abnormal   Collection Time   2011/11/03  6:29 AM      Component Value Range   WBC 4.9  4.0 - 10.5 (K/uL)   RBC 4.01  3.87 - 5.11 (MIL/uL)   Hemoglobin 11.1 (*) 12.0 - 15.0 (g/dL)   HCT 16.1 (*) 09.6 - 46.0 (%)   MCV 86.3  78.0 - 100.0 (fL)   MCH 27.7  26.0 - 34.0 (pg)   MCHC 32.1  30.0 - 36.0 (g/dL)   RDW 04.5  40.9 - 81.1 (%)   Platelets 155  150 - 400 (K/uL)  DIFFERENTIAL     Status: Abnormal   Collection Time   11/03/2011  6:29 AM      Component Value Range   Neutrophils Relative 58  43 - 77 (%)   Neutro Abs 2.8  1.7 - 7.7 (K/uL)   Lymphocytes Relative 23  12 - 46 (%)   Lymphs Abs 1.1  0.7 - 4.0 (K/uL)   Monocytes Relative 15 (*) 3 - 12 (%)   Monocytes Absolute 0.8  0.1 - 1.0 (K/uL)   Eosinophils Relative 4  0 - 5 (%)   Eosinophils Absolute 0.2  0.0 - 0.7 (K/uL)   Basophils Relative 0  0 - 1 (%)   Basophils Absolute 0.0  0.0 - 0.1 (K/uL)  BASIC METABOLIC PANEL     Status: Abnormal   Collection Time   Nov 03, 2011  6:29 AM      Component Value Range   Sodium 137  135 - 145 (mEq/L)   Potassium 3.6  3.5 - 5.1 (mEq/L)   Chloride 102  96 - 112 (mEq/L)   CO2 24  19 - 32 (mEq/L)   Glucose, Bld 86  70 - 99 (mg/dL)   BUN 16  6 - 23 (mg/dL)   Creatinine, Ser 9.14  0.50 - 1.10 (mg/dL)   Calcium 8.6  8.4 - 78.2 (mg/dL)   GFR calc non Af Amer 56 (*)  >90 (mL/min)   GFR calc Af Amer 65 (*) >90 (mL/min)    Studies/Results: Dg Abd 2 Views  11/03/2011  *RADIOLOGY REPORT*  Clinical Data: Ileus versus obstruction.  ABDOMEN - 2 VIEW  Comparison: 10/13/2008.  Findings: Small and large bowel gaseous distention with air fluid levels in both large and small bowel.  Compared to prior exam, the bowel dilation has decreased.   Lumbar fixation hardware is noted. No plain film evidence of free air.  Left basilar atelectasis. Bowel gas extends to the descending colon.  Small amount of rectal gas is present.  IMPRESSION: Normalizing bowel gas pattern, most compatible with resolving ileus or obstruction.  Original Report Authenticated By: Andreas Newport, M.D.   Dg Abd 2 Views  10/14/2011  *RADIOLOGY REPORT*  Clinical Data: Nausea vomiting with abdominal pain.  ABDOMEN -  2 VIEW  Comparison: Lumbar spine films from 09/24/2011.  Findings: There is marked gaseous distention of the stomach.  Small bowel loops in the central abdomen are dilated and measure up to 4.6 cm in diameter.  Right-sided decubitus film shows no evidence for intraperitoneal free air.  There is a paucity of colonic gas noted.  The patient is status post extensive laminectomy and lumbar fusion.  IMPRESSION: Marked gaseous dilatation of the stomach and small bowel.  The patient is postoperative day #2 from redo lumbar fusion.  In this time frame, severe adynamic ileus would be a distinct consideration.  However a small bowel obstruction cannot be excluded.  Original Report Authenticated By: ERIC A. MANSELL, M.D.      Assessment: Postoperative ileus, seemingly improving  Plan: Try clear liquid diet and increase mobility as tolerated.    Kabeer Hoagland C 10/16/2011, 10:42 AM

## 2011-10-17 NOTE — Progress Notes (Signed)
Physical Therapy Treatment Patient Details Name: [name] MRN: [id number] DOB: April 10, 1941 Today's Date: 10/17/2011 Time: 4098-1191 PT Time Calculation (min): 25 min  PT Assessment / Plan / Recommendation Comments on Treatment Session  Pt with great improvements in gait speed as well as fluidity this session. Pt near baseline level.     Follow Up Recommendations  Home health PT;Supervision - Intermittent    Barriers to Discharge        Equipment Recommendations  None recommended by PT    Recommendations for Other Services    Frequency Min 5X/week   Plan Discharge plan remains appropriate;Frequency remains appropriate    Precautions / Restrictions Precautions Precautions: Back Precaution Booklet Issued: Yes (comment) Precaution Comments: Pt able to recall and maintain 3/3 back precautions Required Braces or Orthoses: Spinal Brace Spinal Brace: Thoracolumbosacral orthotic;Applied in sitting position Restrictions Weight Bearing Restrictions: No       Mobility  Bed Mobility Bed Mobility: Sit to Supine Sit to Supine: 5: Supervision Details for Bed Mobility Assistance: VC for sequencing to maintain back precautions Transfers Transfers: Sit to Stand;Stand to Sit Sit to Stand: 5: Supervision;With upper extremity assist;From bed;From toilet Stand to Sit: 5: Supervision;With upper extremity assist;To toilet;To bed Details for Transfer Assistance: Supervision for safety  Ambulation/Gait Ambulation/Gait Assistance: 5: Supervision Ambulation Distance (Feet): 300 Feet Assistive device: Rolling walker Ambulation/Gait Assistance Details: Minimal verbal cues for safety with distance to RW Gait Pattern: Step-through pattern;Decreased stride length Gait velocity: normal 75% of the time Stairs: Yes Stairs Assistance: 6: Modified independent (Device/Increase time) Stair Management Technique: Two rails;Step to pattern;Forwards Number of Stairs: 5     Exercises     PT  Diagnosis:    PT Problem List:   PT Treatment Interventions:     PT Goals Acute Rehab PT Goals PT Goal: Sit to Supine/Side - Progress: Progressing toward goal PT Goal: Sit to Stand - Progress: Progressing toward goal PT Goal: Stand to Sit - Progress: Progressing toward goal PT Goal: Ambulate - Progress: Progressing toward goal PT Goal: Up/Down Stairs - Progress: Met Additional Goals PT Goal: Additional Goal #1 - Progress: Met  Visit Information  Last PT Received On: 10/17/11 Assistance Needed: +1    Subjective Data      Cognition  Overall Cognitive Status: Appears within functional limits for tasks assessed/performed Arousal/Alertness: Awake/alert Orientation Level: Appears intact for tasks assessed Behavior During Session: [facility] for tasks performed    Balance     End of Session PT - End of Session Equipment Utilized During Treatment: Gait belt;Back brace Activity Tolerance: Patient tolerated treatment well Patient left: with call bell/phone within reach;with family/visitor present;in bed Nurse Communication: Mobility status    Milana Kidney 10/17/2011, 2:57 PM  10/17/2011 Milana Kidney DPT PAGER: [phone] OFFICE: [phone]

## 2011-10-17 NOTE — Progress Notes (Signed)
Filed Vitals:   10/16/11 1852 10/16/11 2200 10/17/11 0200 10/17/11 0600  BP: 130/80 146/80 125/75 127/73  Pulse: 84 88 98 96  Temp: 98.3 F (36.8 C) 98.6 F (37 C) 99.2 F (37.3 C) 99.3 F (37.4 C)  TempSrc: Oral     Resp: 20 18 18 18   Height:      Weight:      SpO2: 94% 97% 95% 94%    CBC  Basename 10/16/11 0629 10/15/11 0500  WBC 4.9 3.3*  HGB 11.1* 11.3*  HCT 34.6* 34.1*  PLT 155 139*   BMET  Basename 10/16/11 0629 10/15/11 0500  NA 137 138  K 3.6 3.5  CL 102 100  CO2 24 26  GLUCOSE 86 111*  BUN 16 19  CREATININE 1.01 0.93  CALCIUM 8.6 8.9    Patient resting in bed, was started on clear liquids yesterday, unfortunately with just a small amount of clear liquids, abdomen became distended again and she has not had anything further by mouth. Continues on IVF. Mobility and ambulation limited at this point because of GI difficulties.  Plan: To be reassessed by GI today. Encouraged to ambulate as feasible.  Hewitt Shorts, MD 10/17/2011, 10:20 AM

## 2011-10-18 MED ORDER — GABAPENTIN 300 MG PO CAPS
300.0000 mg | ORAL_CAPSULE | Freq: Two times a day (BID) | ORAL | Status: DC
  Administered 2011-10-18 – 2011-10-19 (×3): 300 mg via ORAL
  Filled 2011-10-18 (×4): qty 1

## 2011-10-18 MED ORDER — PANTOPRAZOLE SODIUM 40 MG PO TBEC
40.0000 mg | DELAYED_RELEASE_TABLET | Freq: Two times a day (BID) | ORAL | Status: DC
  Administered 2011-10-19: 40 mg via ORAL
  Filled 2011-10-18: qty 1

## 2011-10-18 NOTE — Progress Notes (Signed)
Pt didn't have any complains of nausea or vomiting today, but abdomen got a little distended this evening, wasn't hard. So didn't advance the diet, still on clear liquid.

## 2011-10-18 NOTE — Progress Notes (Signed)
Slowly improving  No Nausea /vomiting Amb/ voiding well  Temp:  [97.4 F (36.3 C)-98.2 F (36.8 C)] 98 F (36.7 C) (05/13 1100) Pulse Rate:  [76-95] 83  (05/13 1100) Resp:  [16-18] 18  (05/13 1100) BP: (108-152)/(72-81) 129/81 mmHg (05/13 1100) SpO2:  [96 %-98 %] 97 % (05/13 1100) Good strength and sensation Incision CDI  Plan: Increase diet and activity

## 2011-10-18 NOTE — Progress Notes (Signed)
Occupational Therapy Treatment Patient Details Name: [name] MRN: [id number] DOB: 1940/10/25 Today's Date: 10/18/2011 Time: 7829-5621 OT Time Calculation (min): 21 min  OT Assessment / Plan / Recommendation Comments on Treatment Session All education completed. PT has nmet OT goals and is only limited by GI problems. no further OT needs.    Follow Up Recommendations  Supervision/Assistance - 24 hour    Barriers to Discharge   none    Equipment Recommendations  None recommended by OT             Plan Discharge plan remains appropriate    Precautions / Restrictions Precautions Precautions: Back Precaution Booklet Issued: Yes (comment) Precaution Comments: Pt able to recall and maintain 3/3 back precautions Required Braces or Orthoses: Spinal Brace Spinal Brace: Thoracolumbosacral orthotic;Applied in sitting position   Pertinent Vitals/Pain none    ADL  Ambulation Related to ADLs: mod I with cane ADL Comments: PT completing all ADL adhering to back precautions using nec AQE. Husband assists as needed. Has all nec DME. Able to donn.doff TLSO independentlt.    OT Goals Acute Rehab OT Goals OT Goal Formulation: With patient Time For Goal Achievement: 10/20/11 Potential to Achieve Goals: Good ADL Goals Pt Will Perform Lower Body Bathing: Sit to stand from chair;Sit to stand from bed;with adaptive equipment;with modified independence ADL Goal: Lower Body Bathing - Progress: Met Pt Will Perform Lower Body Dressing: Sit to stand from chair;Sit to stand from bed;with adaptive equipment;with modified independence ADL Goal: Lower Body Dressing - Progress: Met Pt Will Transfer to Toilet: with modified independence;Ambulation;with DME;3-in-1;Maintaining back safety precautions ADL Goal: Toilet Transfer - Progress: Met Pt Will Perform Toileting - Clothing Manipulation: with modified independence;Sitting on 3-in-1 or toilet;Standing ADL Goal: Toileting - Clothing  Manipulation - Progress: Met Pt Will Perform Toileting - Hygiene: with modified independence;Standing at 3-in-1/toilet;Sit to stand from 3-in-1/toilet ADL Goal: Toileting - Hygiene - Progress: Met Pt Will Perform Tub/Shower Transfer: Shower transfer;with supervision;with DME;Ambulation;Shower seat without back ADL Goal: Web designer - Progress: Met Miscellaneous OT Goals Miscellaneous OT Goal #1: Pt will perform bed mobility with mod I in prep for EOB ADLs. OT Goal: Miscellaneous Goal #1 - Progress: Met  Visit Information  Last OT Received On: 10/18/11                     Mobility   mod I      Balance  WFL  End of Session OT - End of Session Equipment Utilized During Treatment: Gait belt;Back brace Activity Tolerance: Patient tolerated treatment well Patient left: in bed;with call bell/phone within reach;with family/visitor present   [facility] 10/18/2011, 10:32 AM Luisa Dago, OTR/L  [phone] 10/18/2011

## 2011-10-18 NOTE — Progress Notes (Signed)
[facility] Progress Note  Subjective: Had some increased abdominal distress earlier yesterday after clear liquids, feels better today. Still having fairly small volume liquid stools. Denies any nausea  Objective: Vital signs in last 24 hours: Temp:  [97.4 F (36.3 C)-98.4 F (36.9 C)] 98 F (36.7 C) (05/13 0600) Pulse Rate:  [76-95] 78  (05/13 0600) Resp:  [16-18] 18  (05/13 0600) BP: (108-152)/(72-79) 133/72 mmHg (05/13 0600) SpO2:  [94 %-98 %] 97 % (05/13 0600) Weight change:    PE: Back brace prohibiting adequate abdominal exam. Does not appear particularly distended Lab Results: No results found for this or any previous visit (from the past 24 hour(s)).  Studies/Results: No results found.    Assessment: Postoperative ileus, seemingly with slow improvement  Plan: Will advance diet continue observation and hopefully further improvement with increased mobilization    Wm Fruchter C 10/18/2011, 7:27 AM

## 2011-10-18 NOTE — Progress Notes (Signed)
Physical Therapy Treatment Patient Details Name: LORELLA GOMEZ MRN: [id number] DOB: Oct 12, 1940 Today's Date: 10/18/2011 Time: 4098-1191 PT Time Calculation (min): 20 min  PT Assessment / Plan / Recommendation Comments on Treatment Session  Patient continues to make really good improvements with mobility. Patient requesting and encouraged to ambulate with spouse.    Follow Up Recommendations  Home health PT;Supervision - Intermittent    Barriers to Discharge        Equipment Recommendations  None recommended by PT    Recommendations for Other Services    Frequency Min 5X/week   Plan Discharge plan remains appropriate    Precautions / Restrictions Precautions Precautions: Back Precaution Booklet Issued: Yes (comment) Precaution Comments: Patient able to recall and maintain all precautions Required Braces or Orthoses: Spinal Brace Spinal Brace: Thoracolumbosacral orthotic;Applied in sitting position   Pertinent Vitals/Pain Denied pain    Mobility  Bed Mobility Rolling Left: 6: Modified independent (Device/Increase time) Left Sidelying to Sit: 6: Modified independent (Device/Increase time) Transfers Sit to Stand: 6: Modified independent (Device/Increase time) Stand to Sit: 6: Modified independent (Device/Increase time) Ambulation/Gait Ambulation/Gait Assistance: 4: Min guard Ambulation Distance (Feet): 300 Feet Assistive device: Straight cane Ambulation/Gait Assistance Details: Patient progressing from RW to [facility] as that is what she was using at home and would like to progress back to.  Gait Pattern: Step-through pattern;Decreased stride length Stairs: No    Exercises     PT Diagnosis:    PT Problem List:   PT Treatment Interventions:     PT Goals Acute Rehab PT Goals PT Goal: Rolling Supine to Right Side - Progress: Progressing toward goal PT Goal: Rolling Supine to Left Side - Progress: Progressing toward goal PT Goal: Supine/Side to Sit - Progress:  Progressing toward goal PT Goal: Sit to Stand - Progress: Met PT Goal: Stand to Sit - Progress: Met PT Goal: Ambulate - Progress: Progressing toward goal  Visit Information  Last PT Received On: 10/18/11 Assistance Needed: +1    Subjective Data      Cognition  Overall Cognitive Status: Appears within functional limits for tasks assessed/performed Arousal/Alertness: Awake/alert Orientation Level: Appears intact for tasks assessed Behavior During Session: [facility] for tasks performed    Balance     End of Session PT - End of Session Equipment Utilized During Treatment: Gait belt;Back brace Activity Tolerance: Patient tolerated treatment well Patient left: in chair;with call bell/phone within reach Nurse Communication: Mobility status    Fredrich Birks 10/18/2011, 11:44 AM  10/18/2011 Fredrich Birks PTA [phone] pager [phone] office

## 2011-10-18 NOTE — Care Management Note (Unsigned)
    Page 1 of 1   10/18/2011     2:18:27 PM   CARE MANAGEMENT NOTE 10/18/2011  Patient:  MARIXA, MELLOTT   Account Number:  [id number]  Date Initiated:  10/15/2011  Documentation initiated by:  [facility]  Subjective/Objective Assessment:   Admitted post L1-2 fusion.LIves with spouse.     Action/Plan:   PT eval- recommending HHPT  OT eval- no further post acute therapy   Anticipated DC Date:  10/19/2011   Anticipated DC Plan:  HOME W HOME HEALTH SERVICES      DC Planning Services  CM consult      Choice offered to / List presented to:  C-1 Patient        HH arranged  HH-2 PT      [facility] agency  [facility]   Status of service:  In process, will continue to follow Medicare Important Message given?   (If response is "NO", the following Medicare IM given date fields will be blank) Date Medicare IM given:   Date Additional Medicare IM given:    Discharge Disposition:    Per UR Regulation:  Reviewed for med. necessity/level of care/duration of stay  If discussed at Long Length of Stay Meetings, dates discussed:    Comments:  PCP Dr. Blair Heys  10/18/11 Spoke with patient about HHC, PT recommending HHPT, order pending. Patient chose [facility] from [facility] agencies list. Dava Najjar at [facility] and requested HHPT, order pending. Will continue to follow for d/c needs. Jacquelynn Cree RN, BSN, CCM

## 2011-10-19 MED ORDER — CYCLOBENZAPRINE HCL 10 MG PO TABS: 10.0000 mg | ORAL_TABLET | Freq: Three times a day (TID) | ORAL | Status: AC | PRN

## 2011-10-19 MED ORDER — HYDROCODONE-ACETAMINOPHEN 7.5-500 MG PO TABS: 1.0000 | ORAL_TABLET | ORAL | Status: AC | PRN

## 2011-10-19 NOTE — Progress Notes (Addendum)
Physical Therapy Treatment Patient Details Name: [name] MRN: [id number] DOB: August 11, 1940 Today's Date: 10/19/2011 Time: 3244-0102 PT Time Calculation (min): 26 min  PT Assessment / Plan / Recommendation Comments on Treatment Session  Patient states that she is tired today therefore request using RW to ensure balance and safety verses relying on cane. Patient encouraged to ambulate daily with husband. Will sign off on acute PT services.    Follow Up Recommendations  Home health PT;Supervision - Intermittent    Barriers to Discharge        Equipment Recommendations  None recommended by PT    Recommendations for Other Services    Frequency     Plan All goals met and education completed, patient dischaged from PT services    Precautions / Restrictions Precautions Precautions: Back Precaution Comments: Patient able to recall and maintain all precautions. Still requiring assistance to don brace but husband will be able to help her at home Spinal Brace: Thoracolumbosacral orthotic;Applied in sitting position Restrictions Weight Bearing Restrictions: No   Pertinent Vitals/Pain 2/10 back and leg pain. Patient states she had medication earlier and didn't need more at this time    Mobility  Bed Mobility Rolling Left: 6: Modified independent (Device/Increase time) Left Sidelying to Sit: 6: Modified independent (Device/Increase time) Sitting - Scoot to Edge of Bed: 6: Modified independent (Device/Increase time) Transfers Sit to Stand: 6: Modified independent (Device/Increase time) Stand to Sit: 6: Modified independent (Device/Increase time) Ambulation/Gait Ambulation/Gait Assistance: 6: Modified independent (Device/Increase time) Ambulation Distance (Feet): 400 Feet Assistive device: Rolling walker Gait Pattern: Within Functional Limits Stairs Assistance: 6: Modified independent (Device/Increase time) Stair Management Technique: Two rails Number of Stairs: 5       Exercises     PT Diagnosis:    PT Problem List:   PT Treatment Interventions:     PT Goals Acute Rehab PT Goals PT Goal: Rolling Supine to Right Side - Progress: Met PT Goal: Rolling Supine to Left Side - Progress: Met PT Goal: Supine/Side to Sit - Progress: Met PT Goal: Sit to Supine/Side - Progress: Met PT Goal: Sit to Stand - Progress: Met PT Goal: Stand to Sit - Progress: Met PT Goal: Ambulate - Progress: Met PT Goal: Up/Down Stairs - Progress: Met Additional Goals PT Goal: Additional Goal #1 - Progress: Met  Visit Information  Last PT Received On: 10/19/11 Assistance Needed: +1    Subjective Data      Cognition  Overall Cognitive Status: Appears within functional limits for tasks assessed/performed Arousal/Alertness: Awake/alert Orientation Level: Appears intact for tasks assessed Behavior During Session: [facility] for tasks performed    Balance     End of Session PT - End of Session Equipment Utilized During Treatment: Gait belt;Back brace Activity Tolerance: Patient tolerated treatment well Patient left: in chair;with call bell/phone within reach Nurse Communication: Mobility status    Fredrich Birks 10/19/2011, 8:09 AM 10/19/2011 Fredrich Birks PTA [phone] pager [phone] office

## 2011-10-19 NOTE — Discharge Summary (Signed)
Physician Discharge Summary  Patient ID: SOLENE HEREFORD MRN: [id number] DOB/AGE: [birth year] 69 y.o.  Admit date: 10/12/2011 Discharge date: 10/19/2011  Admission Diagnoses:Lumbar stenosis, Lumbar radiculopathy, prior surgery, transitional level syndrom   Discharge Diagnoses: Lumbar stenosis, Lumbar radiculopathy, prior surgery, transitional level syndrom  Active Problems:  * No active hospital problems. *    Discharged Condition: fair  Hospital Course: pt admitted day of surgery - had procedure below - pt up ambulating day of surgery - with less leg sx's.  Pt developed post op illeus - GI medicine consulted - pt making progress - +flatus,+BM, abd soft - will d/c home  Consults: GI    Treatments: surgery: Procedure(s):redo decompressive lam decompressing L1 and L2 roots (2levels), TLIF L1-2, interbody cage L1-2,expedium segmented pedicle screw fixation (extending system to L1),  POSTERIORlateral LUMBAR FUSION 1 LEVEL(L1-2), autograft, helos (allograft), bone marrow aspirate      Discharge Exam: Blood pressure 103/70, pulse 81, temperature 97.3 F (36.3 C), temperature source Oral, resp. rate 18, height 5\' 6"  (1.676 m), weight 88.451 kg (195 lb), SpO2 97.00%. Wound:c/d/i  Disposition: home  Discharge Orders    Future Appointments: Provider: Department: Dept Phone: Center:   11/08/2011 10:15 AM [facility] [phone] None   04/17/2012 10:15 AM Waymon Budge, MD [facility] [phone] None     F/u with GI  Medication List  As of 10/19/2011 12:25 PM   TAKE these medications         ALIGN PO   Take 1 tablet by mouth daily.      bimatoprost 0.03 % ophthalmic solution   Commonly known as: LUMIGAN   Place 1 drop into both eyes at bedtime.      Biotin 1000 MCG tablet   Take 1,000 mcg by mouth daily.      Calcium-Magnesium-Zinc 500-250-12.5 MG Tabs   Take 1 tablet by mouth daily.      cyclobenzaprine 10 MG tablet   Commonly  known as: FLEXERIL   Take 1 tablet (10 mg total) by mouth 3 (three) times daily as needed for muscle spasms.      fexofenadine 180 MG tablet   Commonly known as: ALLEGRA   Take 180 mg by mouth daily as needed. For allergies      gabapentin 300 MG capsule   Commonly known as: NEURONTIN   Take 300 mg by mouth at bedtime.      HYDROcodone-acetaminophen 7.5-500 MG per tablet   Commonly known as: LORTAB   Take 1-2 tablets by mouth every 4 (four) hours as needed for pain.      metoprolol succinate 100 MG 24 hr tablet   Commonly known as: TOPROL-XL   Take 100 mg by mouth daily. Take with or immediately following a meal.      NON FORMULARY   ALLERGY SHOTS  WEEKLY      omeprazole 20 MG capsule   Commonly known as: PRILOSEC   Take 20 mg by mouth daily.      pramipexole 0.5 MG tablet   Commonly known as: MIRAPEX   Take 0.5 mg by mouth 3 (three) times daily as needed. For tremors      pravastatin 40 MG tablet   Commonly known as: PRAVACHOL   Take 40 mg by mouth daily.      SENNA LAX PO   Take 1 tablet by mouth 2 (two) times daily.      triamterene-hydrochlorothiazide 37.5-25 MG per capsule   Commonly known as: DYAZIDE  Take 1 capsule by mouth daily.      warfarin 5 MG tablet   Commonly known as: COUMADIN   2.5-5 mg daily. 5mg  on Monday; 2.5mg  the rest of the week             Signed: Clydene Fake, MD 10/19/2011, 12:25 PM

## 2011-10-19 NOTE — Progress Notes (Signed)
Agree with PTA discharge note.  Hadrian Yarbrough, PT DPT [phone]  

## 2011-10-22 ENCOUNTER — Ambulatory Visit (INDEPENDENT_AMBULATORY_CARE_PROVIDER_SITE_OTHER): Payer: Medicare Other | Admitting: *Deleted

## 2011-10-22 DIAGNOSIS — I4891 Unspecified atrial fibrillation: Secondary | ICD-10-CM

## 2011-10-22 LAB — POCT INR: INR: 1.2

## 2011-10-29 ENCOUNTER — Ambulatory Visit (INDEPENDENT_AMBULATORY_CARE_PROVIDER_SITE_OTHER): Payer: Medicare Other | Admitting: *Deleted

## 2011-10-29 DIAGNOSIS — I4891 Unspecified atrial fibrillation: Secondary | ICD-10-CM

## 2011-10-29 LAB — POCT INR: INR: 2.5

## 2011-11-12 ENCOUNTER — Ambulatory Visit (INDEPENDENT_AMBULATORY_CARE_PROVIDER_SITE_OTHER): Payer: Medicare Other | Admitting: *Deleted

## 2011-11-12 DIAGNOSIS — I4891 Unspecified atrial fibrillation: Secondary | ICD-10-CM

## 2011-11-12 LAB — POCT INR: INR: 3.5

## 2011-11-20 ENCOUNTER — Other Ambulatory Visit: Payer: Self-pay | Admitting: Cardiovascular Disease

## 2011-11-26 ENCOUNTER — Ambulatory Visit (INDEPENDENT_AMBULATORY_CARE_PROVIDER_SITE_OTHER): Payer: Medicare Other | Admitting: *Deleted

## 2011-11-26 DIAGNOSIS — I4891 Unspecified atrial fibrillation: Secondary | ICD-10-CM

## 2011-11-26 LAB — POCT INR: INR: 3.5

## 2011-12-10 ENCOUNTER — Ambulatory Visit (INDEPENDENT_AMBULATORY_CARE_PROVIDER_SITE_OTHER): Payer: Medicare Other | Admitting: *Deleted

## 2011-12-10 DIAGNOSIS — I4891 Unspecified atrial fibrillation: Secondary | ICD-10-CM

## 2011-12-10 LAB — POCT INR: INR: 2.3

## 2011-12-24 ENCOUNTER — Ambulatory Visit (INDEPENDENT_AMBULATORY_CARE_PROVIDER_SITE_OTHER): Payer: Medicare Other | Admitting: *Deleted

## 2011-12-24 DIAGNOSIS — I4891 Unspecified atrial fibrillation: Secondary | ICD-10-CM

## 2011-12-24 LAB — POCT INR: INR: 3.2

## 2012-01-07 ENCOUNTER — Ambulatory Visit (INDEPENDENT_AMBULATORY_CARE_PROVIDER_SITE_OTHER): Payer: Medicare Other | Admitting: Pharmacist

## 2012-01-07 DIAGNOSIS — I4891 Unspecified atrial fibrillation: Secondary | ICD-10-CM

## 2012-01-07 LAB — POCT INR: INR: 2.3

## 2012-01-21 ENCOUNTER — Ambulatory Visit (INDEPENDENT_AMBULATORY_CARE_PROVIDER_SITE_OTHER): Payer: Medicare Other | Admitting: Pharmacist

## 2012-01-21 DIAGNOSIS — I4891 Unspecified atrial fibrillation: Secondary | ICD-10-CM

## 2012-01-21 LAB — POCT INR: INR: 3

## 2012-02-10 ENCOUNTER — Other Ambulatory Visit: Payer: Self-pay | Admitting: Cardiovascular Disease

## 2012-02-11 ENCOUNTER — Ambulatory Visit (INDEPENDENT_AMBULATORY_CARE_PROVIDER_SITE_OTHER): Payer: Medicare Other | Admitting: *Deleted

## 2012-02-11 DIAGNOSIS — I4891 Unspecified atrial fibrillation: Secondary | ICD-10-CM

## 2012-02-11 LAB — POCT INR: INR: 2.9

## 2012-02-15 ENCOUNTER — Ambulatory Visit (INDEPENDENT_AMBULATORY_CARE_PROVIDER_SITE_OTHER): Payer: Medicare Other

## 2012-02-15 DIAGNOSIS — J309 Allergic rhinitis, unspecified: Secondary | ICD-10-CM

## 2012-03-09 ENCOUNTER — Other Ambulatory Visit: Payer: Self-pay | Admitting: Cardiovascular Disease

## 2012-03-09 ENCOUNTER — Other Ambulatory Visit: Payer: Self-pay | Admitting: *Deleted

## 2012-03-10 ENCOUNTER — Ambulatory Visit (INDEPENDENT_AMBULATORY_CARE_PROVIDER_SITE_OTHER): Payer: Medicare Other | Admitting: *Deleted

## 2012-03-10 DIAGNOSIS — I4891 Unspecified atrial fibrillation: Secondary | ICD-10-CM

## 2012-03-10 LAB — POCT INR: INR: 3.5

## 2012-03-31 ENCOUNTER — Ambulatory Visit (INDEPENDENT_AMBULATORY_CARE_PROVIDER_SITE_OTHER): Payer: Medicare Other

## 2012-03-31 DIAGNOSIS — I4891 Unspecified atrial fibrillation: Secondary | ICD-10-CM

## 2012-03-31 LAB — POCT INR: INR: 3

## 2012-04-17 ENCOUNTER — Encounter: Payer: Self-pay | Admitting: Internal Medicine

## 2012-04-17 ENCOUNTER — Ambulatory Visit (INDEPENDENT_AMBULATORY_CARE_PROVIDER_SITE_OTHER): Payer: Medicare Other | Admitting: Internal Medicine

## 2012-04-17 VITALS — BP 132/68 | HR 91 | Ht 66.25 in | Wt 202.8 lb

## 2012-04-17 DIAGNOSIS — J309 Allergic rhinitis, unspecified: Secondary | ICD-10-CM

## 2012-04-17 DIAGNOSIS — G4733 Obstructive sleep apnea (adult) (pediatric): Secondary | ICD-10-CM

## 2012-04-17 DIAGNOSIS — G4730 Sleep apnea, unspecified: Secondary | ICD-10-CM

## 2012-04-17 NOTE — Progress Notes (Signed)
Patient ID: [name], female    DOB: 07/03/1940, 71 y.o.   MRN: [id number]  HPI 86 yoF followed for allergic rhinitis, hx sleep apnea, complicated by AtrialFib.  3 months ago had lumbar spine surgery with rods and screws, complicated by ileitis. Denies respiratory complications.  Mild sleep apnea AHI 6.3- never tolerated CPAP. Confined more indoors with less exposure to Spring pollen. Taking her allergy shots, GO 1:10. Occasional allegra works ok. To see Dr [name] next week for f/u since feet have been swelling. Noting vasomotor rhinorhea before meals. She had glaucoma before cataract surgery so we chose not to start ipratropium.   04/16/11- 69 yoF followed for allergic rhinitis, hx sleep apnea, complicated by AtrialFib. Since last here had chicken pox and an episode of PAFib with spontaneous reversion to NSR this Fall. 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 Has had flu vax. Now feels well.  Had failed CPAP in the past- could not sleep with it. Now denies sleep concerns. Continues allergy vaccine at 1:10 and feels this is doing well.   04/17/12-  71 yoF followed for allergic rhinitis, hx sleep apnea, complicated by AtrialFib  Pt states that allergies have been doing very well. She states that she has just gotten over a cold recently.  Allergy vax 1:10  GO administered weekly.  Pt states doing well with sleep-husband keeps telling her that she snores. Back surgery  Oct 12, 2011--no effect on sleep.  She states that she has just gotten over a cold recently. . She is interested in learning about oral appliances for sleep apnea.- Discussed treatment options and sleep hygiene. NPSG 01/25/08- AHI 6/ hr.   Review of Systems- see HPI Constitutional:   No-   weight loss, night sweats, fevers, chills, fatigue, lassitude. HEENT:   No-  headaches, difficulty swallowing, tooth/dental problems, sore throat,       No-  sneezing, itching, ear ache, nasal congestion, post nasal drip,  CV:   No-   chest pain, orthopnea, PND, swelling in lower extremities, anasarca, dizziness, palpitations Resp: No-   shortness of breath with exertion or at rest.              No-   productive cough,  No non-productive cough,  No- coughing up of blood.              No-   change in color of mucus.  No- wheezing.   Skin: No-   rash or lesions. GI:  No-   heartburn, indigestion, abdominal pain, nausea, vomiting,  GU:  MS:  No-   joint pain or swelling.   Neuro-     nothing unusual Psych:  No- change in mood or affect. No depression or anxiety.  No memory loss.  Objective:   Physical Exam General- Alert, Oriented, Affect-appropriate, Distress- none acute.  Skin- rash-none, lesions- none, excoriation- none Lymphadenopathy- none Head- atraumatic            Eyes- Gross vision intact, PERRLA, conjunctivae clear secretions            Ears- Hearing, canals-normal            Nose- Clear, no-Septal dev, mucus, polyps, erosion, perforation             Throat- Mallampati II-III , mucosa clear , drainage- none, tonsils- atrophic Neck- flexible , trachea midline, no stridor , thyroid nl, carotid no bruit Chest - symmetrical excursion , unlabored  Heart/CV- RRR , no murmur , no gallop  , no rub, nl s1 s2                           - JVD- none , edema- none, stasis changes- none, varices- none           Lung- clear to P&A, wheeze- none, cough- none , dullness-none, rub- none           Chest wall-  Abd-  Br/ Gen/ Rectal- Not done, not indicated Extrem- cyanosis- none, clubbing, none, atrophy- none, strength- nl. Cane. Neuro- grossly intact to observation           Is

## 2012-04-17 NOTE — Patient Instructions (Addendum)
Order-   [facility] Orthodontic referral to Dr Althea Grimmer for oral appliance for sleep apnea  We can continue allergy vaccine  Try the Breathe Right nasal strips to see if they help your snoring.   Try to sleep off the flat of your back when you can.

## 2012-04-19 ENCOUNTER — Encounter: Payer: Self-pay | Admitting: Cardiovascular Disease

## 2012-04-19 ENCOUNTER — Ambulatory Visit (INDEPENDENT_AMBULATORY_CARE_PROVIDER_SITE_OTHER): Payer: Medicare Other | Admitting: Cardiovascular Disease

## 2012-04-19 VITALS — BP 120/72 | HR 83 | Resp 16 | Ht 66.0 in | Wt 202.0 lb

## 2012-04-19 DIAGNOSIS — E785 Hyperlipidemia, unspecified: Secondary | ICD-10-CM

## 2012-04-19 DIAGNOSIS — R609 Edema, unspecified: Secondary | ICD-10-CM

## 2012-04-19 DIAGNOSIS — I4891 Unspecified atrial fibrillation: Secondary | ICD-10-CM

## 2012-04-19 NOTE — Assessment & Plan Note (Signed)
Cholesterol is at goal.  Continue current dose of statin and diet Rx.  No myalgias or side effects.  F/U  LFT's in 6 months. Lab Results  Component Value Date   LDLCALC  Value: 101        Total Cholesterol/HDL:CHD Risk Coronary Heart Disease Risk Table                     Men   Women  1/2 Average Risk   3.4   3.3* 12/22/2006             

## 2012-04-19 NOTE — Progress Notes (Signed)
Patient ID: SUNDAY KLOS, female   DOB: [birth year], 71 y.o.   MRN: [id number] Lynnell Fiumara is a 71 yo female with a history of paroxysmal atrial fibrillation on chronic Coumadin therapy. She has a history of chest discomfort. She had heart catheterization in Feb. 2002 that demonstrated normal coronary arteries and normal LV function. A Myoview study done in July 2009 demonstrated no ischemia. Her last echocardiogram done in July 2008 demonstrated an ejection fraction of 60%, mild LVH, mild LAE. 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 She's had multiple back surgeries. Seeing Dr Phoebe Perch in a month. Has had some irritable bowel and is on probiotics  Reviewed echo from 12/14/10 Normal RV/LV function no significant valve disease  Reviewed LE venous duplex 12/07/10 no DVT or venous insuficiency   Back in water Rx for 4th back operation  No other issues  ROS: Denies fever, malais, weight loss, blurry vision, decreased visual acuity, cough, sputum, SOB, hemoptysis, pleuritic pain, palpitaitons, heartburn, abdominal pain, melena, lower extremity edema, claudication, or rash.  All other systems reviewed and negative  General: Affect appropriate Healthy:  appears stated age HEENT: normal Neck supple with no adenopathy JVP normal no bruits no thyromegaly Lungs clear with no wheezing and good diaphragmatic motion Heart:  S1/S2 no murmur, no rub, gallop or click PMI normal Abdomen: benighn, BS positve, no tenderness, no AAA no bruit.  No HSM or HJR Distal pulses intact with no bruits No edema Neuro non-focal Skin warm and dry No muscular weakness   Current Outpatient Prescriptions  Medication Sig Dispense Refill  . bimatoprost (LUMIGAN) 0.03 % ophthalmic drops Place 1 drop into both eyes at bedtime.       . fexofenadine (ALLEGRA) 180 MG tablet Take 180 mg by  mouth daily as needed. For allergies      . gabapentin (NEURONTIN) 300 MG capsule 3 (three) times daily. Take 300mg  qam, 100mg  in afternoon and 300mg  at bedtime      . metoprolol succinate (TOPROL-XL) 100 MG 24 hr tablet TAKE 1 TABLET DAILY  90 tablet  3  . NON FORMULARY ALLERGY SHOTS WEEKLY      . omeprazole (PRILOSEC) 20 MG capsule Take 20 mg by mouth daily.       . polyethylene glycol powder (GLYCOLAX/MIRALAX) powder Take 17 g by mouth daily.      . pramipexole (MIRAPEX) 0.5 MG tablet Take 0.5 mg by mouth 3 (three) times daily as needed. For tremors      . pravastatin (PRAVACHOL) 40 MG tablet Take 40 mg by mouth daily.        . Probiotic Product (ALIGN PO) Take 1 tablet by mouth daily.      Marland Kitchen triamterene-hydrochlorothiazide (DYAZIDE) 37.5-25 MG per capsule Take 1 capsule by mouth daily.       Marland Kitchen warfarin (COUMADIN) 5 MG tablet TAKE AS DIRECTED BY ANTICOAGULATION CLINIC  60 tablet  0    Allergies  Relafen and Zocor  Electrocardiogram:  Assessment and Plan

## 2012-04-19 NOTE — Patient Instructions (Signed)
Your physician wants you to follow-up in:   6 months with DR NISHAN You will receive a reminder letter in the mail two months in advance. If you don't receive a letter, please call our office to schedule the follow-up appointment. Your physician recommends that you continue on your current medications as directed. Please refer to the Current Medication list given to you today. 

## 2012-04-19 NOTE — Assessment & Plan Note (Signed)
Dependant related to obesity and multiple previous back surgeries

## 2012-04-19 NOTE — Assessment & Plan Note (Addendum)
We reviewed treatment options, emphasizing basic conservative first steps- weight loss, sleep off back when recovered from back surgery, continue medical therapy for nasal congestion. She had tried and failed CPAP with Dr Shelle Iron in past.  Plan- She is interested in learning more about oral appliance therapy. Will refer to Dr Myrtis Ser.

## 2012-04-19 NOTE — Assessment & Plan Note (Addendum)
She continues to feel that allergy vaccine helps her and is worth continuing. Discussed antihistamines, nasal strips. Consider nasal steroid spray.

## 2012-04-19 NOTE — Assessment & Plan Note (Signed)
Maint NSR with no palpitations.  Continue coumadin

## 2012-04-28 ENCOUNTER — Ambulatory Visit (INDEPENDENT_AMBULATORY_CARE_PROVIDER_SITE_OTHER): Payer: Medicare Other | Admitting: *Deleted

## 2012-04-28 DIAGNOSIS — I4891 Unspecified atrial fibrillation: Secondary | ICD-10-CM

## 2012-04-28 LAB — POCT INR: INR: 2.3

## 2012-05-26 ENCOUNTER — Ambulatory Visit (INDEPENDENT_AMBULATORY_CARE_PROVIDER_SITE_OTHER): Payer: Medicare Other | Admitting: *Deleted

## 2012-05-26 ENCOUNTER — Ambulatory Visit (INDEPENDENT_AMBULATORY_CARE_PROVIDER_SITE_OTHER): Payer: Medicare Other

## 2012-05-26 DIAGNOSIS — J309 Allergic rhinitis, unspecified: Secondary | ICD-10-CM

## 2012-05-26 DIAGNOSIS — I4891 Unspecified atrial fibrillation: Secondary | ICD-10-CM

## 2012-05-26 LAB — POCT INR: INR: 2.4

## 2012-07-07 ENCOUNTER — Ambulatory Visit (INDEPENDENT_AMBULATORY_CARE_PROVIDER_SITE_OTHER): Payer: Medicare Other

## 2012-07-07 DIAGNOSIS — I4891 Unspecified atrial fibrillation: Secondary | ICD-10-CM

## 2012-07-07 LAB — POCT INR: INR: 3

## 2012-08-04 ENCOUNTER — Other Ambulatory Visit: Payer: Self-pay | Admitting: *Deleted

## 2012-08-04 ENCOUNTER — Telehealth: Payer: Self-pay | Admitting: Cardiovascular Disease

## 2012-08-04 MED ORDER — METOPROLOL SUCCINATE ER 100 MG PO TB24: ORAL_TABLET | ORAL | Status: DC

## 2012-08-04 MED ORDER — WARFARIN SODIUM 5 MG PO TABS: 5.0000 mg | ORAL_TABLET | ORAL | Status: DC

## 2012-08-04 NOTE — Telephone Encounter (Signed)
Refill for Metoprolol sent to [facility].  Pt is requesting a 90 day supply of Coumadin.  Will forward to [facility] to address coumadin refill.

## 2012-08-04 NOTE — Telephone Encounter (Signed)
New Prob   Requesting prescriptions sent into new pharmacy.   Medications Metoprolol 100 mg 90 day supply Warfarin 5 mg 90 day supply   Mail order [employer] fax number: [phone]

## 2012-08-18 ENCOUNTER — Ambulatory Visit (INDEPENDENT_AMBULATORY_CARE_PROVIDER_SITE_OTHER): Payer: Medicare Other | Admitting: *Deleted

## 2012-08-18 DIAGNOSIS — I4891 Unspecified atrial fibrillation: Secondary | ICD-10-CM

## 2012-08-18 LAB — POCT INR: INR: 3.1

## 2012-09-15 ENCOUNTER — Ambulatory Visit (INDEPENDENT_AMBULATORY_CARE_PROVIDER_SITE_OTHER): Payer: Medicare Other | Admitting: *Deleted

## 2012-09-15 DIAGNOSIS — I4891 Unspecified atrial fibrillation: Secondary | ICD-10-CM

## 2012-09-15 LAB — POCT INR: INR: 2.9

## 2012-10-11 ENCOUNTER — Ambulatory Visit (INDEPENDENT_AMBULATORY_CARE_PROVIDER_SITE_OTHER): Payer: Medicare Other

## 2012-10-11 DIAGNOSIS — J309 Allergic rhinitis, unspecified: Secondary | ICD-10-CM

## 2012-10-19 ENCOUNTER — Encounter: Payer: Self-pay | Admitting: Cardiovascular Disease

## 2012-10-19 ENCOUNTER — Ambulatory Visit (INDEPENDENT_AMBULATORY_CARE_PROVIDER_SITE_OTHER): Payer: Medicare Other | Admitting: Cardiovascular Disease

## 2012-10-19 ENCOUNTER — Ambulatory Visit (INDEPENDENT_AMBULATORY_CARE_PROVIDER_SITE_OTHER): Payer: Medicare Other | Admitting: *Deleted

## 2012-10-19 VITALS — BP 136/80 | HR 76 | Wt 202.0 lb

## 2012-10-19 DIAGNOSIS — I4891 Unspecified atrial fibrillation: Secondary | ICD-10-CM

## 2012-10-19 DIAGNOSIS — R609 Edema, unspecified: Secondary | ICD-10-CM

## 2012-10-19 DIAGNOSIS — E785 Hyperlipidemia, unspecified: Secondary | ICD-10-CM

## 2012-10-19 LAB — POCT INR: INR: 2.3

## 2012-10-19 NOTE — Progress Notes (Signed)
Patient ID: [name], female   DOB: April 04, 1941, 72 y.o.   MRN: [id number] [name] is a 72 yo female with a history of paroxysmal atrial fibrillation on chronic Coumadin therapy. She has a history of chest discomfort. She had heart catheterization in Feb. 2002 that demonstrated normal coronary arteries and normal LV function. A Myoview study done in July 2009 demonstrated no ischemia. Her last echocardiogram done in July 2008 demonstrated an ejection fraction of 60%, mild LVH, mild LAE. 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 She's had multiple back surgeries. Seeing Dr Phoebe Perch in a month. Has had some irritable bowel and is on probiotics  Reviewed echo from 12/14/10 Normal RV/LV function no significant valve disease  Reviewed LE venous duplex 12/07/10 no DVT or venous insuficiency  Back in water Rx for 4th back operation No other issues  Some dependant edema.  Diuretic to be started after urodynamic testing  ROS: Denies fever, malais, weight loss, blurry vision, decreased visual acuity, cough, sputum, SOB, hemoptysis, pleuritic pain, palpitaitons, heartburn, abdominal pain, melena, claudication, or rash.  All other systems reviewed and negative  General: Affect appropriate Healthy:  appears stated age HEENT: normal Neck supple with no adenopathy JVP normal no bruits no thyromegaly Lungs clear with no wheezing and good diaphragmatic motion Heart:  S1/S2 no murmur, no rub, gallop or click PMI normal Abdomen: benighn, BS positve, no tenderness, no AAA no bruit.  No HSM or HJR Distal pulses intact with no bruits Plus one bilateral edema Neuro non-focal Skin warm and dry No muscular weakness   Current Outpatient Prescriptions  Medication Sig Dispense Refill  . Acetaminophen (TYLENOL PO) Take 2 tablets by mouth as directed.      . bimatoprost  (LUMIGAN) 0.03 % ophthalmic drops Place 1 drop into both eyes at bedtime.       . fexofenadine (ALLEGRA) 180 MG tablet Take 180 mg by mouth daily as needed. For allergies      . gabapentin (NEURONTIN) 300 MG capsule Take 300mg  qam, 300mg  at bedtime      . metoprolol succinate (TOPROL-XL) 100 MG 24 hr tablet TAKE 1 TABLET DAILY  90 tablet  3  . NON FORMULARY ALLERGY SHOTS WEEKLY      . omeprazole (PRILOSEC) 20 MG capsule Take 20 mg by mouth daily.       . pramipexole (MIRAPEX) 0.5 MG tablet Take 0.5 mg by mouth 3 (three) times daily as needed. For tremors      . pravastatin (PRAVACHOL) 40 MG tablet Take 40 mg by mouth daily.        . Sennosides (SENOKOT PO) Take by mouth 3 (three) times daily as needed.      . triamterene-hydrochlorothiazide (DYAZIDE) 37.5-25 MG per capsule Take 1 capsule by mouth daily.       Marland Kitchen warfarin (COUMADIN) 5 MG tablet Take 1 tablet (5 mg total) by mouth as directed.  90 tablet  1   No current facility-administered medications for this visit.    Allergies  Relafen and Zocor  Electrocardiogram:  SR rate 76 Low voltage   Assessment and Plan

## 2012-10-19 NOTE — Assessment & Plan Note (Signed)
Dependant start diuretic per Dr Richardo Priest

## 2012-10-19 NOTE — Assessment & Plan Note (Signed)
Cholesterol is at goal.  Continue current dose of statin and diet Rx.  No myalgias or side effects.  F/U  LFT's in 6 months. Lab Results  Component Value Date   LDLCALC  Value: 101        Total Cholesterol/HDL:CHD Risk Coronary Heart Disease Risk Table                     Men   Women  1/2 Average Risk   3.4   3.3* 12/22/2006             

## 2012-10-19 NOTE — Assessment & Plan Note (Signed)
Maint NSR good anticoagulation

## 2012-10-19 NOTE — Patient Instructions (Signed)
Your physician wants you to follow-up in: YEAR WITH DR NISHAN  You will receive a reminder letter in the mail two months in advance. If you don't receive a letter, please call our office to schedule the follow-up appointment.  Your physician recommends that you continue on your current medications as directed. Please refer to the Current Medication list given to you today. 

## 2012-11-17 ENCOUNTER — Ambulatory Visit (INDEPENDENT_AMBULATORY_CARE_PROVIDER_SITE_OTHER): Payer: Medicare Other | Admitting: *Deleted

## 2012-11-17 DIAGNOSIS — I4891 Unspecified atrial fibrillation: Secondary | ICD-10-CM

## 2012-11-17 LAB — POCT INR: INR: 2.3

## 2012-12-29 ENCOUNTER — Ambulatory Visit (INDEPENDENT_AMBULATORY_CARE_PROVIDER_SITE_OTHER): Payer: Medicare Other | Admitting: *Deleted

## 2012-12-29 DIAGNOSIS — I4891 Unspecified atrial fibrillation: Secondary | ICD-10-CM

## 2012-12-29 LAB — POCT INR: INR: 2.3

## 2013-02-09 ENCOUNTER — Ambulatory Visit (INDEPENDENT_AMBULATORY_CARE_PROVIDER_SITE_OTHER): Payer: Medicare Other | Admitting: *Deleted

## 2013-02-09 DIAGNOSIS — I4891 Unspecified atrial fibrillation: Secondary | ICD-10-CM

## 2013-02-09 LAB — POCT INR: INR: 2.3

## 2013-02-15 ENCOUNTER — Ambulatory Visit (INDEPENDENT_AMBULATORY_CARE_PROVIDER_SITE_OTHER): Payer: Medicare Other

## 2013-02-15 DIAGNOSIS — J309 Allergic rhinitis, unspecified: Secondary | ICD-10-CM

## 2013-02-21 ENCOUNTER — Encounter: Payer: Self-pay | Admitting: Neurology

## 2013-02-21 ENCOUNTER — Ambulatory Visit (INDEPENDENT_AMBULATORY_CARE_PROVIDER_SITE_OTHER): Payer: Medicare Other | Admitting: Neurology

## 2013-02-21 VITALS — BP 129/72 | HR 77 | Ht 65.75 in | Wt 201.0 lb

## 2013-02-21 DIAGNOSIS — G8929 Other chronic pain: Secondary | ICD-10-CM

## 2013-02-21 DIAGNOSIS — M25569 Pain in unspecified knee: Secondary | ICD-10-CM

## 2013-02-21 DIAGNOSIS — G609 Hereditary and idiopathic neuropathy, unspecified: Secondary | ICD-10-CM

## 2013-02-21 NOTE — Patient Instructions (Signed)
Overall you are doing fairly well but I do want to suggest a few things today:   Remember to drink plenty of fluid, eat healthy meals and do not skip any meals. Try to eat protein with a every meal and eat a healthy snack such as fruit or nuts in between meals. Try to keep a regular sleep-wake schedule and try to exercise daily, particularly in the form of walking, 20-30 minutes a day, if you can.   As far as diagnostic testing: I would like to check some blood work and order a EMG/NCS.  I would like to see you back in once the workup is completed, sooner if we need to. Please call us with any interim questions, concerns, problems, updates or refill requests.   Please also call us for any test results so we can go over those with you on the phone.  My clinical assistant and will answer any of your questions and relay your messages to me and also relay most of my messages to you.   Our phone number is [phone]. We also have an after hours call service for urgent matters and there is a physician on-call for urgent questions. For any emergencies you know to call 911 or go to the nearest emergency room

## 2013-02-21 NOTE — Progress Notes (Signed)
[facility]  Provider:  Dr Hosie Poisson Referring Provider: Thora Lance, MD Primary Care Physician:  Thora Lance, MD  CC:  Knee and leg pain  HPI:  [name] is a 72 y.o. female here as a referral from Dr. Manus Gunning for knee pain  Back pain started around 5 years ago, and multiple surgeries since then. Right knee pain started in May, described as burning sensation anterior surface of knee, only occurs at bedtime while sleeping. Patient will sleep on her right side, wakes up with burning sensation in her knee. Try to rubber neither makes it worse. Will get up and move around which helps relieve it. Sleeping in a recliner helps. Pain is only in the anterior surface of knee, no associated swelling or erythema. No radiating symptoms. Symptoms did not occur during daytime. Has chronic lower back pain. Has chronic foot drop in the left not having cramping pain in both feet distally. Denies any true non-pain related weakness in her legs. No saddle anesthesia. No loss of bowel bladder control. Has been evaluated by  spinal surgeon who says everything looks fine in the back. Also notes occasional burning sensation bilateral buttocks area, nonradiating. Surgical history includes a lumbar laminectomy, unsure what level at 2010, redo in 2011 due to CSF leak, and 2013 at L1-L2 fusion and laminectomy. Kidneys have pain after surgeries.  Currently taking gabapentin 300mg  twice a day. Has been on this medication for around 3 years. Unsure if this is giving her any effect. Currently taking pramipexole for restless leg syndrome, feels controls her symptoms well.  Review of Systems: Out of a complete 14 system review, the patient complains of only the following symptoms, and all other reviewed systems are negative. positive for leg and back pain  History   Social History  . Marital Status: Married    Spouse Name: [name]    Number of Children: 2  . Years of Education: 12th     Occupational History  . retired     Sales promotion account executive work   Social History Main Topics  . Smoking status: Never Smoker   . Smokeless tobacco: Never Used  . Alcohol Use: No     Comment: Caffeine on rare occ  . Drug Use: No  . Sexual Activity: Not on file     Comment: hysterectomy   Other Topics Concern  . Not on file   Social History Narrative   Patient lives at home with husband [name].   Patient is retired.   Patient has a high school education.    Patient has 2 children     Family History  Problem Relation Age of Onset  . Arthritis Mother   . Heart attack Father   . Hypertension Sister   . Asthma Sister   . Allergies Sister     Past Medical History  Diagnosis Date  . CHF (congestive heart failure)   . PAF (paroxysmal atrial fibrillation)   . Hypertension   . DJD (degenerative joint disease)   . Allergic rhinitis   . Hyperlipidemia   . Lumbar stenosis   . Spondylosis   . Scoliosis     with radiculopathy L2-S1 with prior surgery  . Small bowel obstruction     versus ileus after last bck surgery  . Sleep apnea     no cpap now  . GERD (gastroesophageal reflux disease)     Past Surgical History  Procedure Laterality Date  . Septoplasty  80's  . Total abdominal hysterectomy    .  Lipoma excision      bil  . Lumbar disc surgery      x2   spinal leak  once   . Repair spinal fluid leak  10  . Cataract extraction  11    bil  . Eye surgery      Current Outpatient Prescriptions  Medication Sig Dispense Refill  . Acetaminophen (TYLENOL PO) Take 2 tablets by mouth as directed.      . bimatoprost (LUMIGAN) 0.03 % ophthalmic drops Place 1 drop into both eyes at bedtime.       . Cinnamon 500 MG capsule Take 500 mg by mouth daily. Pt take 2 capsules daily.      . fexofenadine (ALLEGRA) 180 MG tablet Take 180 mg by mouth daily as needed. For allergies      . furosemide (LASIX) 20 MG tablet Take 20 mg by mouth daily. As needed      . gabapentin (NEURONTIN) 300 MG  capsule Take 300mg  qam, 300mg  at bedtime      . Linaclotide (LINZESS) 290 MCG CAPS Take 1 capsule by mouth as needed.       . metoprolol succinate (TOPROL-XL) 100 MG 24 hr tablet TAKE 1 TABLET DAILY  90 tablet  3  . NON FORMULARY ALLERGY SHOTS WEEKLY      . omeprazole (PRILOSEC) 20 MG capsule Take 20 mg by mouth daily.       . pramipexole (MIRAPEX) 0.5 MG tablet Take 0.5 mg by mouth 3 (three) times daily as needed. For tremors      . pravastatin (PRAVACHOL) 40 MG tablet Take 40 mg by mouth daily.        Marland Kitchen triamterene-hydrochlorothiazide (DYAZIDE) 37.5-25 MG per capsule Take 1 capsule by mouth daily.       Marland Kitchen warfarin (COUMADIN) 5 MG tablet Take 5 mg by mouth as directed. Take 1/2 tab daily       No current facility-administered medications for this visit.    Allergies as of 02/21/2013 - Review Complete 02/21/2013  Allergen Reaction Noted  . Relafen [nabumetone]  04/17/2012  . Zocor [simvastatin]  04/17/2012    Vitals: BP 129/72  Pulse 77  Ht 5' 5.75" (1.67 m)  Wt 201 lb (91.173 kg)  BMI 32.69 kg/m2 Last Weight:  Wt Readings from Last 1 Encounters:  02/21/13 201 lb (91.173 kg)   Last Height:   Ht Readings from Last 1 Encounters:  02/21/13 5' 5.75" (1.67 m)     Physical exam: Exam: Gen: NAD, conversant Eyes: anicteric sclerae, moist conjunctivae HENT: Atraumati Neck: Trachea midline; supple,  Lungs: CTA, no wheezing, rales, rhonic                          CV: RRR, no MRG Abdomen: Soft, non-tender;  Extremities: No peripheral edema, no swelling or erythema R knee, no tenderness to palpation Skin: Normal temperature, no rash,  Psych: Appropriate affect, pleasant  Neuro: MS: AA&Ox3, appropriately interactive, normal affect   Speech: fluent w/o paraphasic error  Memory: good recent and remote recall  CN: PERRL, EOMI no nystagmus, no ptosis, sensation intact to LT V1-V3 bilat, face symmetric, no weakness, hearing grossly intact, palate elevates symmetrically, shoulder  shrug 5/5 bilat,  tongue protrudes midline, no fasiculations noted.  Motor: normal bulk and tone Strength: 5/5  In all extremities except for L ant tib 4/5  Coord: rapid alternating and point-to-point (FNF, HTS) movements intact.  Reflexes: symmetrical, bilat downgoing toes  Sens: LT intact in all extremities, decreased vibration and proprioception bilat LE  Gait:antalgic gait favoring R leg   Assessment:  After physical and neurologic examination, review of laboratory studies, imaging, neurophysiology testing and pre-existing records, assessment will be reviewed on the problem list.  Plan:  Treatment plan and additional workup will be reviewed under Problem List.  1) Right knee pain  Ms Spielberg is a pleasant 72y/o woman presenting for initial evaluation of R knee burning pain. Unclear etiology of symptoms. Will consider an atypical presentation of a radiculopathy vs neuropathy. Will check EMG/NCS and do lab workup for neuropathy. Pending results will consider medication change. Would consider cymbalta, lyrica or horizant.

## 2013-02-27 ENCOUNTER — Other Ambulatory Visit (INDEPENDENT_AMBULATORY_CARE_PROVIDER_SITE_OTHER): Payer: Self-pay

## 2013-02-27 DIAGNOSIS — Z0289 Encounter for other administrative examinations: Secondary | ICD-10-CM

## 2013-03-02 ENCOUNTER — Encounter (INDEPENDENT_AMBULATORY_CARE_PROVIDER_SITE_OTHER): Payer: Self-pay

## 2013-03-02 ENCOUNTER — Ambulatory Visit (INDEPENDENT_AMBULATORY_CARE_PROVIDER_SITE_OTHER): Payer: Medicare Other | Admitting: Neurology

## 2013-03-02 DIAGNOSIS — G544 Lumbosacral root disorders, not elsewhere classified: Secondary | ICD-10-CM

## 2013-03-02 DIAGNOSIS — G5732 Lesion of lateral popliteal nerve, left lower limb: Secondary | ICD-10-CM

## 2013-03-02 DIAGNOSIS — G609 Hereditary and idiopathic neuropathy, unspecified: Secondary | ICD-10-CM

## 2013-03-02 DIAGNOSIS — I4891 Unspecified atrial fibrillation: Secondary | ICD-10-CM

## 2013-03-02 NOTE — Procedures (Signed)
HISTORY:  [name] is a 72 year old patient with a history of several prior lumbosacral spine surgeries and a history of a footdrop on the left side. Within the last 3 months, the patient has noted a burning sensation in the right knee and a sensation of restless legs while laying down at night involving the right leg. The pain is gone when she is up on her feet. The patient does have some pain across the low back. The patient is being evaluated for a possible neuropathy or a lumbosacral radiculopathy.  NERVE CONDUCTION STUDIES:  Nerve conduction studies were performed on both lower extremities. The distal motor latencies for the peroneal nerves were prolonged on the left, normal the right, with a low motor for the peroneal nerve on the left, normal on the right. The distal motor latencies and motor and these for the posterior tibial nerves were normal bilaterally. The nerve conduction velocities for the peroneal and posterior tibial nerves were normal bilaterally. The sensory latencies for the peroneal nerves were absent on the left, and normal on the right. The H reflex latencies were absent bilaterally.  EMG STUDIES: EMG study was performed on the left lower extremity:  The tibialis anterior muscle reveals up to 15K motor units with moderately decreased recruitment. 3+ fibrillations and positive waves were seen. The peroneus tertius muscle reveals up to 10 K motor units with moderately decreased recruitment. 3+ fibrillations and positive waves were seen. The medial gastrocnemius muscle reveals up to 8 K motor units with moderately reduced recruitment. No fibrillations or positive waves were seen. The vastus lateralis muscle reveals 2 to 4K motor units with full recruitment. No fibrillations or positive waves were seen. The iliopsoas muscle reveals 2 to 4K motor units with full recruitment. No fibrillations or positive waves were seen. The biceps femoris muscle (long head) reveals up to  8 K motor units with decreased recruitment. No fibrillations or positive waves were seen. Complex repetitive discharges were seen. The lumbosacral paraspinal muscles were tested at 3 levels, and revealed 3+ fibrillations and positive waves at all 3 levels tested. There was good relaxation.  EMG study was performed on the right lower extremity:  The tibialis anterior muscle reveals 2 to 5K motor units with decreased recruitment. No fibrillations or positive waves were seen. The peroneus tertius muscle reveals 2 to 5K motor units with decreased recruitment. No fibrillations or positive waves were seen. The medial gastrocnemius muscle reveals 2 to 6 K motor units with decreased recruitment. No fibrillations or positive waves were seen. The vastus lateralis muscle reveals 2 to 4K motor units with full recruitment. No fibrillations or positive waves were seen. The iliopsoas muscle reveals 2 to 4K motor units with full recruitment. No fibrillations or positive waves were seen. The biceps femoris muscle (long head) reveals 2 to 4K motor units with full recruitment. No fibrillations or positive waves were seen. The lumbosacral paraspinal muscles were tested at 3 levels, and revealed 2+ fibrillations and positive waves at the upper and lower levels tested. 3+ fibrillations and positive waves were seen at the middle level. There was good relaxation.   IMPRESSION:  Nerve conduction studies done on both lower extremities shows no clear evidence of a generalized peripheral neuropathy. 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 There appears to be an overlying relatively severe  chronic L5 and S1 radiculopathy. 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 On the right lower extremity, EMG  evaluation reveals evidence of a mild primarily chronic L5 and S1 radiculopathy. No other significant abnormalities were seen.  Marlan Palau MD 03/02/2013 1:57 PM  [facility] [address] [place], [place] [address]  Phone [phone] Fax [phone]

## 2013-03-05 ENCOUNTER — Encounter: Payer: Self-pay | Admitting: Neurology

## 2013-03-05 LAB — TSH: TSH: 3.38 u[IU]/mL (ref 0.450–4.500)

## 2013-03-05 LAB — HGB A1C W/O EAG: Hgb A1c MFr Bld: 6 % — ABNORMAL HIGH (ref 4.8–5.6)

## 2013-03-05 LAB — VITAMIN B12: Vitamin B-12: 439 pg/mL (ref 211–946)

## 2013-03-05 LAB — METHYLMALONIC ACID, SERUM: Methylmalonic Acid: 162 nmol/L (ref 0–378)

## 2013-03-07 NOTE — Progress Notes (Signed)
Quick Note:  Left message at [phone] with normal blood work results, per Dr.Sumner. ______

## 2013-03-16 ENCOUNTER — Encounter: Payer: Self-pay | Admitting: Neurology

## 2013-03-16 ENCOUNTER — Ambulatory Visit (INDEPENDENT_AMBULATORY_CARE_PROVIDER_SITE_OTHER): Payer: Medicare Other | Admitting: Neurology

## 2013-03-16 VITALS — BP 113/68 | HR 79 | Ht 65.0 in | Wt 201.0 lb

## 2013-03-16 DIAGNOSIS — G629 Polyneuropathy, unspecified: Secondary | ICD-10-CM

## 2013-03-16 DIAGNOSIS — G609 Hereditary and idiopathic neuropathy, unspecified: Secondary | ICD-10-CM

## 2013-03-16 MED ORDER — PREGABALIN 75 MG PO CAPS: 75.0000 mg | ORAL_CAPSULE | Freq: Two times a day (BID) | ORAL | Status: DC

## 2013-03-16 NOTE — Progress Notes (Signed)
[facility]   Provider:  Dr Hosie Poisson Referring Provider: Thora Lance, MD Primary Care Physician:  Thora Lance, MD  CC:  Right knee pain  HPI:  [name] is a 72 y.o. female here as a follow up of her right knee pain. Since last visit had an EMG/NCS done with results below. She continues to have a burning pain sensation on the anterior surface of her right knee. Is non-radiating. Worse at night. Continues on neurontin, unsure if giving her any benefit.   EMG/NCS results: Nerve conduction studies done on both lower extremities shows no clear evidence of a generalized peripheral neuropathy. 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 On the right lower extremity, EMG evaluation reveals evidence of a mild primarily chronic L5 and S1 radiculopathy. No other significant abnormalities were seen.   Concerns/Questions:Review of Systems: Out of a complete 14 system review, the patient complains of only the following symptoms, and all other reviewed systems are negative. For knee pain gait instability  History   Social History  . Marital Status: Married    Spouse Name: [name]    Number of Children: 2  . Years of Education: 12th    Occupational History  . retired     Sales promotion account executive work   Social History Main Topics  . Smoking status: Never Smoker   . Smokeless tobacco: Never Used  . Alcohol Use: No     Comment: Caffeine on rare occ  . Drug Use: No  . Sexual Activity: Not on file     Comment: hysterectomy   Other Topics Concern  . Not on file   Social History Narrative   Patient lives at home with husband  [name].   Patient is retired.   Patient has a high school education.    Patient has 2 children     Family History  Problem Relation Age of Onset  . Arthritis Mother   . Heart attack Father   . Hypertension Sister   . Asthma Sister   . Allergies Sister     Past Medical History  Diagnosis Date  . CHF (congestive heart failure)   . PAF (paroxysmal atrial fibrillation)   . Hypertension   . DJD (degenerative joint disease)   . Allergic rhinitis   . Hyperlipidemia   . Lumbar stenosis   . Spondylosis   . Scoliosis     with radiculopathy L2-S1 with prior surgery  . Small bowel obstruction     versus ileus after last bck surgery  . Sleep apnea     no cpap now  . GERD (gastroesophageal reflux disease)     Past Surgical History  Procedure Laterality Date  . Septoplasty  80's  . Total abdominal hysterectomy    . Lipoma excision      bil  . Lumbar disc surgery      x2   spinal leak  once   . Repair spinal fluid leak  10  . Cataract extraction  11    bil  . Eye surgery      Current Outpatient Prescriptions  Medication Sig Dispense Refill  . Acetaminophen (TYLENOL PO) Take 2 tablets by  mouth as directed.      . bimatoprost (LUMIGAN) 0.03 % ophthalmic drops Place 1 drop into both eyes at bedtime.       . Cinnamon 500 MG capsule Take 500 mg by mouth daily. Pt take 2 capsules daily.      . fexofenadine (ALLEGRA) 180 MG tablet Take 180 mg by mouth daily as needed. For allergies      . furosemide (LASIX) 20 MG tablet Take 20 mg by mouth daily. As needed      . gabapentin (NEURONTIN) 300 MG capsule Take 300mg  qam, 300mg  at bedtime      . Linaclotide (LINZESS) 290 MCG CAPS Take 145 mcg by mouth as needed.       . metoprolol succinate (TOPROL-XL) 100 MG 24 hr tablet TAKE 1 TABLET DAILY  90 tablet  3  . NON FORMULARY ALLERGY SHOTS WEEKLY      . omeprazole (PRILOSEC) 20 MG capsule Take 20 mg by mouth daily.       . pramipexole (MIRAPEX) 0.5 MG tablet Take 0.5 mg by mouth 3 (three)  times daily as needed. For tremors      . pravastatin (PRAVACHOL) 40 MG tablet Take 40 mg by mouth daily.        . traMADol (ULTRAM) 50 MG tablet       . triamterene-hydrochlorothiazide (DYAZIDE) 37.5-25 MG per capsule Take 1 capsule by mouth daily.       Marland Kitchen warfarin (COUMADIN) 5 MG tablet Take 5 mg by mouth as directed. Take 1/2 tab daily       No current facility-administered medications for this visit.    Allergies as of 03/16/2013 - Review Complete 03/16/2013  Allergen Reaction Noted  . Relafen [nabumetone]  04/17/2012  . Zocor [simvastatin]  04/17/2012    Vitals: BP 113/68  Pulse 79  Ht 5\' 5"  (1.651 m)  Wt 201 lb (91.173 kg)  BMI 33.45 kg/m2 Last Weight:  Wt Readings from Last 1 Encounters:  03/16/13 201 lb (91.173 kg)   Last Height:   Ht Readings from Last 1 Encounters:  03/16/13 5\' 5"  (1.651 m)     Physical exam: Exam: Gen: NAD, conversant Eyes: anicteric sclerae, moist conjunctivae HENT: Atraumatic, oropharynx clear Lungs: CTA, no wheezing, rales, rhonic                          CV: RRR, no MRG Abdomen: Soft, non-tender;  Extremities: No peripheral edema, no tenderness or pain with palpation R knee, no swelling/cyst in posterior knee region Skin: Normal temperature, no rash,  Psych: Appropriate affect, pleasant  Neuro: MS: AA&Ox3, appropriately interactive, normal affect   Speech: fluent w/o paraphasic error   Memory: good recent and remote recall   CN:  PERRL, EOMI no nystagmus, no ptosis, sensation intact to LT V1-V3 bilat, face symmetric, no weakness, hearing grossly intact, palate elevates symmetrically, shoulder shrug 5/5 bilat,  tongue protrudes midline, no fasiculations noted.  Motor: normal bulk and tone  Strength:  5/5 In all extremities except for L ant tib 4/5   Reflexes: symmetrical, bilat downgoing toes  Sens: LT intact in all extremities  Gait:antalgic gait favoring R leg      Assessment:  After physical and neurologic  examination, review of laboratory studies, imaging, neurophysiology testing and pre-existing records, assessment will be reviewed on the problem list.  Plan:  Treatment plan and additional workup will be reviewed under Problem List.   Patient returns for follow  up visit. 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 EMG/NCS shows bilateral radiculopathy but symptoms not consistent with a radiculopathy. Will discontinue Gabapentin and start patient on Lyrica 75mg  BID. Can consider Cymbalta in the future. Follow up in 6 months.

## 2013-03-16 NOTE — Patient Instructions (Signed)
Overall you are doing fairly well but I do want to suggest a few things today:   As far as your medications are concerned, I would like to suggest discontinuing the gabapentin. We will switch you to a medication called Lyrica 75mg . Please take 1 capsule twice a day.  I would like to see you back in 6  months, sooner if we need to. Please call us with any interim questions, concerns, problems, updates or refill requests.   My clinical assistant and will answer any of your questions and relay your messages to me and also relay most of my messages to you.   Our phone number is [phone]. We also have an after hours call service for urgent matters and there is a physician on-call for urgent questions. For any emergencies you know to call 911 or go to the nearest emergency room

## 2013-03-23 ENCOUNTER — Ambulatory Visit (INDEPENDENT_AMBULATORY_CARE_PROVIDER_SITE_OTHER): Payer: Medicare Other | Admitting: *Deleted

## 2013-03-23 DIAGNOSIS — I4891 Unspecified atrial fibrillation: Secondary | ICD-10-CM

## 2013-03-23 LAB — POCT INR: INR: 2.8

## 2013-03-26 ENCOUNTER — Telehealth: Payer: Self-pay | Admitting: Cardiovascular Disease

## 2013-03-26 NOTE — Telephone Encounter (Signed)
Spoke with pt in regards to her taking Lyrica instead of Gabapentin. She was made aware that there is not any interactions.

## 2013-03-26 NOTE — Telephone Encounter (Signed)
New Problem  Pt states that Dr. Hosie Poisson advised her to begin taking lyrica 75 mg and discontinue Gabapentin. Pt wanted coumadin clinic to be aware// request a call back to discuss.

## 2013-04-02 ENCOUNTER — Telehealth: Payer: Self-pay | Admitting: Neurology

## 2013-04-17 ENCOUNTER — Encounter (INDEPENDENT_AMBULATORY_CARE_PROVIDER_SITE_OTHER): Payer: Self-pay

## 2013-04-17 ENCOUNTER — Ambulatory Visit (INDEPENDENT_AMBULATORY_CARE_PROVIDER_SITE_OTHER): Payer: Medicare Other | Admitting: Internal Medicine

## 2013-04-17 ENCOUNTER — Other Ambulatory Visit: Payer: Self-pay | Admitting: Neurology

## 2013-04-17 ENCOUNTER — Encounter: Payer: Self-pay | Admitting: Internal Medicine

## 2013-04-17 VITALS — BP 124/82 | HR 87 | Ht 66.25 in | Wt 200.0 lb

## 2013-04-17 DIAGNOSIS — G4733 Obstructive sleep apnea (adult) (pediatric): Secondary | ICD-10-CM

## 2013-04-17 DIAGNOSIS — Z23 Encounter for immunization: Secondary | ICD-10-CM

## 2013-04-17 DIAGNOSIS — J309 Allergic rhinitis, unspecified: Secondary | ICD-10-CM

## 2013-04-17 MED ORDER — PREGABALIN 75 MG PO CAPS: 75.0000 mg | ORAL_CAPSULE | Freq: Two times a day (BID) | ORAL | Status: DC

## 2013-04-17 MED ORDER — EPINEPHRINE 0.3 MG/0.3ML IJ SOAJ: INTRAMUSCULAR | Status: DC

## 2013-04-17 NOTE — Patient Instructions (Signed)
We can continue allergy vaccine 1:10 GO  Script for Epipen - required to have available in case of severe allergic reaction.  Pneumococcal conjugate vaccine 13

## 2013-04-17 NOTE — Telephone Encounter (Signed)
Pt's prescription was faxed over to [facility] at [phone].

## 2013-04-17 NOTE — Progress Notes (Signed)
Patient ID: [name], female    DOB: [birth year], 72 y.o.   MRN: [id number]  HPI 36 yoF followed for allergic rhinitis, hx sleep apnea, complicated by AtrialFib.  3 months ago had lumbar spine surgery with rods and screws, complicated by ileitis. Denies respiratory complications.  Mild sleep apnea AHI 6.3- never tolerated CPAP. Confined more indoors with less exposure to Spring pollen. Taking her allergy shots, GO 1:10. Occasional allegra works ok. To see Dr [name] next week for f/u since feet have been swelling. Noting vasomotor rhinorhea before meals. She had glaucoma before cataract surgery so we chose not to start ipratropium.   04/16/11- 69 yoF followed for allergic rhinitis, hx sleep apnea, complicated by AtrialFib. Since last here had chicken pox and an episode of PAFib with spontaneous reversion to NSR this Fall. 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 Has had flu vax. Now feels well.  Had failed CPAP in the past- could not sleep with it. Now denies sleep concerns. Continues allergy vaccine at 1:10 and feels this is doing well.   04/17/12-  71 yoF followed for allergic rhinitis, hx sleep apnea, complicated by AtrialFib  Pt states that allergies have been doing very well. She states that she has just gotten over a cold recently.  Allergy vax 1:10  GO administered weekly.  Pt states doing well with sleep-husband keeps telling her that she snores. Back surgery  Oct 12, 2011--no effect on sleep.  She states that she has just gotten over a cold recently. . She is interested in learning about oral appliances for sleep apnea.- Discussed treatment options and sleep hygiene. NPSG 01/25/08- AHI 6/ hr.   04/17/13- 72 yoF followed for allergic rhinitis, hx sleep apnea/ oral appliance, complicated by AtrialFib  Pt states that allergies have been doing very well. She states that she has just gotten over a cold recently.  Allergy vax 1:10  GO administered weekly. (Sister of [name]) Doing well with allergy vaccine. We discussed EpiPen. No problem with reactions. Got an oral appliance for sleep apnea from Dr. Myrtis [name].. Husband tells her she does not snore when she wears it.  Review of Systems- see HPI Constitutional:   No-   weight loss, night sweats, fevers, chills, fatigue, lassitude. HEENT:   No-  headaches, difficulty swallowing, tooth/dental problems, sore throat,       No-  sneezing, itching, ear ache, nasal congestion, post nasal drip,  CV:  No-   chest pain, orthopnea, PND, swelling in lower extremities, anasarca, dizziness, palpitations Resp: No-   shortness of breath with exertion or at rest.              No-   productive cough,  No non-productive cough,  No- coughing up of blood.              No-   change in color of mucus.  No- wheezing.   Skin: No-   rash or lesions. GI:  No-   heartburn, indigestion, abdominal pain, nausea, vomiting,  GU:  MS:  No-   joint pain or swelling.   Neuro-     nothing unusual Psych:  No- change in mood or affect. No depression or anxiety.  No memory loss.  Objective:   Physical Exam General- Alert, Oriented, Affect-appropriate, Distress- none acute.  Skin- rash-none, lesions- none, excoriation- none Lymphadenopathy- none Head- atraumatic            Eyes- Gross vision intact, PERRLA, conjunctivae clear secretions  Ears- Hearing, canals-normal            Nose- Clear, no-Septal dev, mucus, polyps, erosion, perforation             Throat- Mallampati II-III , mucosa clear , drainage- none, tonsils- atrophic Neck- flexible , trachea midline, no stridor , thyroid nl, carotid no bruit Chest - symmetrical excursion , unlabored           Heart/CV- RRR/ no pacer , no murmur , no gallop  , no rub, nl s1 s2                           - JVD- none , edema- none, stasis changes- none, varices- none           Lung- clear to P&A, wheeze- none, cough- none , dullness-none, rub- none           Chest wall-  Abd-  Br/ Gen/  Rectal- Not done, not indicated Extrem- cyanosis- none, clubbing, none, atrophy- none, strength- nl. Cane. Neuro- grossly intact to observation           Is

## 2013-05-03 NOTE — Assessment & Plan Note (Signed)
Continues allergy vaccine. She believes it helps. No problems. Plan-refill EpiPen Update pneumonia vaccine

## 2013-05-03 NOTE — Assessment & Plan Note (Signed)
Good subjective control with oral appliance.

## 2013-05-04 ENCOUNTER — Ambulatory Visit (INDEPENDENT_AMBULATORY_CARE_PROVIDER_SITE_OTHER): Payer: Medicare Other | Admitting: Pharmacist

## 2013-05-04 DIAGNOSIS — I4891 Unspecified atrial fibrillation: Secondary | ICD-10-CM

## 2013-05-04 LAB — POCT INR: INR: 3.5

## 2013-05-09 ENCOUNTER — Encounter: Payer: Self-pay | Admitting: *Deleted

## 2013-05-25 ENCOUNTER — Telehealth: Payer: Self-pay | Admitting: *Deleted

## 2013-05-25 NOTE — Telephone Encounter (Signed)
Change from Lyrica back to gabapentin noted. I did not call the patient.

## 2013-06-08 ENCOUNTER — Ambulatory Visit (INDEPENDENT_AMBULATORY_CARE_PROVIDER_SITE_OTHER): Payer: Medicare Other | Admitting: Pharmacist

## 2013-06-08 DIAGNOSIS — I4891 Unspecified atrial fibrillation: Secondary | ICD-10-CM

## 2013-06-08 LAB — POCT INR: INR: 2.7

## 2013-06-20 ENCOUNTER — Ambulatory Visit (INDEPENDENT_AMBULATORY_CARE_PROVIDER_SITE_OTHER): Payer: Medicare Other

## 2013-06-20 DIAGNOSIS — J309 Allergic rhinitis, unspecified: Secondary | ICD-10-CM

## 2013-07-20 ENCOUNTER — Ambulatory Visit (INDEPENDENT_AMBULATORY_CARE_PROVIDER_SITE_OTHER): Payer: Medicare Other | Admitting: *Deleted

## 2013-07-20 DIAGNOSIS — I4891 Unspecified atrial fibrillation: Secondary | ICD-10-CM

## 2013-07-20 DIAGNOSIS — Z5181 Encounter for therapeutic drug level monitoring: Secondary | ICD-10-CM | POA: Insufficient documentation

## 2013-07-20 LAB — POCT INR: INR: 2.2

## 2013-07-23 ENCOUNTER — Telehealth: Payer: Self-pay

## 2013-07-25 MED ORDER — WARFARIN SODIUM 5 MG PO TABS: ORAL_TABLET | ORAL | Status: DC

## 2013-07-25 NOTE — Telephone Encounter (Signed)
Rx sent [facility]  

## 2013-08-08 ENCOUNTER — Other Ambulatory Visit: Payer: Self-pay | Admitting: *Deleted

## 2013-08-08 MED ORDER — METOPROLOL SUCCINATE ER 100 MG PO TB24: ORAL_TABLET | ORAL | Status: DC

## 2013-08-28 ENCOUNTER — Telehealth: Payer: Self-pay | Admitting: Cardiovascular Disease

## 2013-08-28 NOTE — Telephone Encounter (Signed)
Will forward to Dr.Nishan. 

## 2013-08-28 NOTE — Telephone Encounter (Signed)
Pt states back surgeon wants to do cortisone injection in back and wants pt to hold Coumadin 5 days prior.  Procedure date unknown at present.  Please advise if ok to hold Coumadin x 5 days prior to procedure.  Thanks.

## 2013-08-28 NOTE — Telephone Encounter (Signed)
New Message  Pt called states that she will have a cortisone injection and will need to come off of coumadin// Please assist

## 2013-08-29 NOTE — Telephone Encounter (Signed)
Yes she can stop coumadin for injections

## 2013-08-29 NOTE — Telephone Encounter (Signed)
PT  NOTIFIED ./CY 

## 2013-08-31 ENCOUNTER — Telehealth: Payer: Self-pay | Admitting: Neurology

## 2013-08-31 ENCOUNTER — Ambulatory Visit (INDEPENDENT_AMBULATORY_CARE_PROVIDER_SITE_OTHER): Payer: Medicare Other

## 2013-08-31 DIAGNOSIS — I4891 Unspecified atrial fibrillation: Secondary | ICD-10-CM

## 2013-08-31 DIAGNOSIS — Z5181 Encounter for therapeutic drug level monitoring: Secondary | ICD-10-CM

## 2013-08-31 LAB — POCT INR: INR: 2.3

## 2013-08-31 NOTE — Telephone Encounter (Signed)
Called patient to r/s appointment for 09/18/13, appointment r/s to 09/17/13 at 1 pm.

## 2013-09-17 ENCOUNTER — Ambulatory Visit: Payer: Self-pay | Admitting: Neurology

## 2013-09-17 ENCOUNTER — Telehealth: Payer: Self-pay | Admitting: Neurology

## 2013-09-17 NOTE — Telephone Encounter (Signed)
No show for follow up on 09/17/2013

## 2013-09-18 ENCOUNTER — Ambulatory Visit: Payer: Medicare Other | Admitting: Neurology

## 2013-09-24 ENCOUNTER — Ambulatory Visit (INDEPENDENT_AMBULATORY_CARE_PROVIDER_SITE_OTHER): Payer: Medicare Other | Admitting: Pharmacist

## 2013-09-24 DIAGNOSIS — I4891 Unspecified atrial fibrillation: Secondary | ICD-10-CM

## 2013-09-24 DIAGNOSIS — Z5181 Encounter for therapeutic drug level monitoring: Secondary | ICD-10-CM

## 2013-09-24 LAB — POCT INR: INR: 1.9

## 2013-10-08 ENCOUNTER — Ambulatory Visit (INDEPENDENT_AMBULATORY_CARE_PROVIDER_SITE_OTHER): Payer: Medicare Other | Admitting: *Deleted

## 2013-10-08 DIAGNOSIS — I4891 Unspecified atrial fibrillation: Secondary | ICD-10-CM

## 2013-10-08 DIAGNOSIS — Z5181 Encounter for therapeutic drug level monitoring: Secondary | ICD-10-CM

## 2013-10-08 LAB — POCT INR: INR: 2.3

## 2013-10-09 ENCOUNTER — Other Ambulatory Visit: Payer: Self-pay | Admitting: Family Medicine

## 2013-10-09 DIAGNOSIS — R51 Headache: Secondary | ICD-10-CM

## 2013-10-12 ENCOUNTER — Ambulatory Visit
Admission: RE | Admit: 2013-10-12 | Discharge: 2013-10-12 | Disposition: A | Discharge status: Home / Self Care | Payer: Medicare Other | Source: Ambulatory Visit | Attending: Family Medicine | Admitting: Family Medicine

## 2013-10-12 DIAGNOSIS — R51 Headache: Secondary | ICD-10-CM

## 2013-10-15 ENCOUNTER — Ambulatory Visit (INDEPENDENT_AMBULATORY_CARE_PROVIDER_SITE_OTHER): Payer: Medicare Other

## 2013-10-15 DIAGNOSIS — J309 Allergic rhinitis, unspecified: Secondary | ICD-10-CM

## 2013-10-25 ENCOUNTER — Other Ambulatory Visit: Payer: Self-pay

## 2013-10-25 MED ORDER — METOPROLOL SUCCINATE ER 100 MG PO TB24: ORAL_TABLET | ORAL | Status: DC

## 2013-10-30 ENCOUNTER — Ambulatory Visit (INDEPENDENT_AMBULATORY_CARE_PROVIDER_SITE_OTHER): Payer: Medicare Other | Admitting: *Deleted

## 2013-10-30 DIAGNOSIS — I4891 Unspecified atrial fibrillation: Secondary | ICD-10-CM

## 2013-10-30 DIAGNOSIS — Z5181 Encounter for therapeutic drug level monitoring: Secondary | ICD-10-CM

## 2013-10-30 LAB — POCT INR: INR: 2.6

## 2013-11-16 ENCOUNTER — Encounter: Payer: Self-pay | Admitting: Neurology

## 2013-11-16 ENCOUNTER — Ambulatory Visit (INDEPENDENT_AMBULATORY_CARE_PROVIDER_SITE_OTHER): Payer: Medicare Other | Admitting: Neurology

## 2013-11-16 VITALS — BP 120/74 | HR 86 | Ht 66.0 in | Wt 199.0 lb

## 2013-11-16 DIAGNOSIS — R5383 Other fatigue: Secondary | ICD-10-CM

## 2013-11-16 DIAGNOSIS — R5381 Other malaise: Secondary | ICD-10-CM

## 2013-11-16 DIAGNOSIS — R531 Weakness: Secondary | ICD-10-CM

## 2013-11-16 DIAGNOSIS — R269 Unspecified abnormalities of gait and mobility: Secondary | ICD-10-CM

## 2013-11-16 DIAGNOSIS — R2681 Unsteadiness on feet: Secondary | ICD-10-CM | POA: Insufficient documentation

## 2013-11-16 DIAGNOSIS — R42 Dizziness and giddiness: Secondary | ICD-10-CM

## 2013-11-16 NOTE — Patient Instructions (Signed)
Overall you are doing fairly well but I do want to suggest a few things today:   Remember to drink plenty of fluid, eat healthy meals and do not skip any meals. Try to eat protein with a every meal and eat a healthy snack such as fruit or nuts in between meals. Try to keep a regular sleep-wake schedule and try to exercise daily, particularly in the form of walking, 20-30 minutes a day, if you can.   As far as diagnostic testing:  1)I would like you to have a X-ray of your cervical spine. You can go to [facility] whenever it is convenient for you 2)Please schedule a carotid ultrasound when you check out 3)I would like you to have a MRI of your brain to check for a stroke  We will follow up once the workup is completed. Please call us with any interim questions, concerns, problems, updates or refill requests.   My clinical assistant and will answer any of your questions and relay your messages to me and also relay most of my messages to you.   Our phone number is [phone]. We also have an after hours call service for urgent matters and there is a physician on-call for urgent questions. For any emergencies you know to call 911 or go to the nearest emergency room

## 2013-11-16 NOTE — Progress Notes (Signed)
[facility]   Provider:  Dr [name] Referring Provider: [name], MD Primary Care Physician:  Simona Huh, MD  CC:  Right knee pain  HPI:  [name] is a 73 y.o. female here as a follow up with new onset dizziness. States having these episodes off and on for the past 3 months. Fluctuates in severity but has been constant. Notes feeling light headed, no sensation of vertigo or movement. Notes feeling off balance, unsteady on her feet. Uses a walker, needs a walker or wall to support herself. States these episodes came on acutely.  Episodes are worse with standing but can occur sitting. Not precipitated or made worse by movement or head turning. Notes some increased muscle weakness.   Continues to have lower back pain, is followed by neurosurgery, has received steroid injections in her hips.   EMG/NCS results: Nerve conduction studies done on both lower extremities shows no clear evidence of a generalized peripheral neuropathy. 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 On the right lower extremity, EMG evaluation reveals evidence of a mild primarily chronic L5 and S1 radiculopathy. No other significant abnormalities were seen.   Concerns/Questions:Review of Systems: Out of a complete 14 system review, the patient complains of only the following symptoms, and all other reviewed systems are negative. For knee pain gait instability  History   Social History  . Marital Status: Married    Spouse Name: [name]    Number of Children: 2  . Years of Education: 12th    Occupational History  . retired     Investment banker, corporate work   Social History Main Topics  . Smoking status: Never Smoker   . Smokeless tobacco: Never Used  . Alcohol Use: No     Comment: Caffeine on rare occ  . Drug Use: No  . Sexual Activity: Not on file     Comment: hysterectomy   Other Topics Concern  . Not on file   Social History Narrative   Patient lives at home with husband [name].   Patient is retired.   Patient has a high school education.    Patient has 2 children     Family History  Problem Relation Age of Onset  . Arthritis Mother   . Heart attack Father   . Hypertension Sister   . Asthma Sister   . Allergies Sister     Past Medical History  Diagnosis Date  . CHF (congestive heart failure)   . PAF (paroxysmal atrial fibrillation)   . Hypertension   . DJD (degenerative joint disease)   . Allergic rhinitis   . Hyperlipidemia   . Lumbar stenosis   . Spondylosis   . Scoliosis     with radiculopathy L2-S1 with prior surgery  . Small bowel obstruction     versus ileus after last bck surgery  . Sleep apnea     no cpap now  . GERD (gastroesophageal reflux disease)     Past Surgical History  Procedure Laterality Date  . Septoplasty  80's  . Total abdominal hysterectomy    . Lipoma excision      bil  . Lumbar disc surgery  x2   spinal leak  once   . Repair spinal fluid leak  10  . Cataract extraction  11    bil  . Eye surgery      Current Outpatient Prescriptions  Medication Sig Dispense Refill  . Acetaminophen (TYLENOL PO) Take 2 tablets by mouth as directed.      . bimatoprost (LUMIGAN) 0.03 % ophthalmic drops Place 1 drop into both eyes at bedtime.       . Calcium Carbonate Antacid (TUMS E-X PO) Take by mouth.      . Cinnamon 500 MG capsule Take 500 mg by mouth daily. Pt take 2 capsules daily.      Marland Kitchen EPINEPHrine (EPI-PEN) 0.3 mg/0.3 mL SOAJ injection Inject into thigh if needed for severe allergic reaction  1 Device  prn  . fexofenadine (ALLEGRA) 180 MG tablet Take 180 mg by mouth daily  as needed. For allergies      . furosemide (LASIX) 20 MG tablet Take 20 mg by mouth daily. As needed      . gabapentin (NEURONTIN) 300 MG capsule Take 300 mg by mouth 2 (two) times daily.      Marland Kitchen KRILL OIL PO Take by mouth.      . Linaclotide (LINZESS) 290 MCG CAPS Take 145 mcg by mouth as needed.       . metoprolol succinate (TOPROL-XL) 100 MG 24 hr tablet TAKE 1 TABLET DAILY  90 tablet  0  . NON FORMULARY ALLERGY SHOTS WEEKLY      . omeprazole (PRILOSEC) 20 MG capsule Take 20 mg by mouth daily.       . pramipexole (MIRAPEX) 0.5 MG tablet Take 0.5 mg by mouth daily. For tremors      . pravastatin (PRAVACHOL) 40 MG tablet Take 40 mg by mouth daily.        . traMADol (ULTRAM) 50 MG tablet Take 50 mg by mouth as needed.       . triamterene-hydrochlorothiazide (DYAZIDE) 37.5-25 MG per capsule Take 1 capsule by mouth daily.       Marland Kitchen warfarin (COUMADIN) 5 MG tablet Take as directed by Anticoagulation clinic  90 tablet  1   No current facility-administered medications for this visit.    Allergies as of 11/16/2013 - Review Complete 11/16/2013  Allergen Reaction Noted  . Relafen [nabumetone]  04/17/2012  . Zocor [simvastatin]  04/17/2012    Vitals: BP 120/74  Pulse 86  Ht 5\' 6"  (1.676 m)  Wt 199 lb (90.266 kg)  BMI 32.13 kg/m2 Last Weight:  Wt Readings from Last 1 Encounters:  11/16/13 199 lb (90.266 kg)   Last Height:   Ht Readings from Last 1 Encounters:  11/16/13 5\' 6"  (1.676 m)     Physical exam: Exam: Gen: NAD, conversant Eyes: anicteric sclerae, moist conjunctivae HENT: Atraumatic, oropharynx clear Lungs: CTA, no wheezing, rales, rhonic                          CV: RRR, no MRG Abdomen: Soft, non-tender;  Extremities: No peripheral edema, no tenderness or pain with palpation R knee, no swelling/cyst in posterior knee region Skin: Normal temperature, no rash,  Psych: Appropriate affect, pleasant  Neuro: MS: AA&Ox3, appropriately interactive, normal affect   Speech:  fluent w/o paraphasic error   Memory: good recent and remote recall   CN:  PERRL, EOMI no nystagmus, no ptosis, sensation intact to LT V1-V3 bilat, face symmetric, no weakness, hearing grossly intact,  palate elevates symmetrically, shoulder shrug 5/5 bilat,  tongue protrudes midline, no fasiculations noted.  Motor: normal bulk and tone  Strength:  5/5 In all extremities except for L ant tib 4-/5   Reflexes: symmetrical, bilat downgoing toes  Sens: LT intact in all extremities, decreased vibration, PP, temp bilateral LE  Gait:stands slowly, slightly wide based, slow shuffling steps. Wobbles with Rhomberg, walks with assistance of a walker   Assessment:  After physical and neurologic examination, review of laboratory studies, imaging, neurophysiology testing and pre-existing records, assessment will be reviewed on the problem list.  Plan:  Treatment plan and additional workup will be reviewed under Problem List.  1)Gait instability 2)Dizziness 3)Weakness   Patient returns for follow up visit with new concern over acute onset of dizziness and gait instability. Prior cervical x-ray shows multilevel degenerative changes, will repeat this to check for worsening and possible cord compression as an etiology. Acute onset raises questions of possible CVA, specifically a posterior circulation etiology. Will check MRI/A brain and carotid doppler. Offered PT referral but patient wishes to hold off pending workup. Will follow up once workup completed.

## 2013-11-22 ENCOUNTER — Ambulatory Visit (INDEPENDENT_AMBULATORY_CARE_PROVIDER_SITE_OTHER): Payer: Medicare Other

## 2013-11-22 DIAGNOSIS — R531 Weakness: Secondary | ICD-10-CM

## 2013-11-22 DIAGNOSIS — R42 Dizziness and giddiness: Secondary | ICD-10-CM

## 2013-11-22 DIAGNOSIS — R2681 Unsteadiness on feet: Secondary | ICD-10-CM

## 2013-11-27 ENCOUNTER — Ambulatory Visit (INDEPENDENT_AMBULATORY_CARE_PROVIDER_SITE_OTHER): Payer: Medicare Other | Admitting: *Deleted

## 2013-11-27 DIAGNOSIS — Z5181 Encounter for therapeutic drug level monitoring: Secondary | ICD-10-CM

## 2013-11-27 DIAGNOSIS — I4891 Unspecified atrial fibrillation: Secondary | ICD-10-CM

## 2013-11-27 LAB — POCT INR: INR: 1.7

## 2013-11-28 ENCOUNTER — Ambulatory Visit
Admission: RE | Admit: 2013-11-28 | Discharge: 2013-11-28 | Disposition: A | Discharge status: Home / Self Care | Payer: Medicare Other | Source: Ambulatory Visit | Attending: Neurology | Admitting: Neurology

## 2013-11-28 DIAGNOSIS — R42 Dizziness and giddiness: Secondary | ICD-10-CM

## 2013-11-28 DIAGNOSIS — R531 Weakness: Secondary | ICD-10-CM

## 2013-11-28 DIAGNOSIS — R2681 Unsteadiness on feet: Secondary | ICD-10-CM

## 2013-11-28 DIAGNOSIS — R269 Unspecified abnormalities of gait and mobility: Secondary | ICD-10-CM

## 2013-12-04 ENCOUNTER — Telehealth: Payer: Self-pay | Admitting: *Deleted

## 2013-12-04 NOTE — Telephone Encounter (Signed)
Spoke to patient and she verbalizes understanding of normal results.

## 2013-12-04 NOTE — Telephone Encounter (Signed)
Message copied by Vivi Barrack on Tue Dec 04, 2013  1:43 PM ------      Message from: Drema Dallas      Created: Mon Dec 03, 2013  4:01 PM       Please let her know her MRI was unremarkable. Thanks. ------

## 2013-12-11 ENCOUNTER — Ambulatory Visit (INDEPENDENT_AMBULATORY_CARE_PROVIDER_SITE_OTHER): Payer: Medicare Other | Admitting: Pharmacist

## 2013-12-11 DIAGNOSIS — I4891 Unspecified atrial fibrillation: Secondary | ICD-10-CM

## 2013-12-11 DIAGNOSIS — Z5181 Encounter for therapeutic drug level monitoring: Secondary | ICD-10-CM

## 2013-12-11 LAB — POCT INR: INR: 1.7

## 2013-12-31 ENCOUNTER — Telehealth: Payer: Self-pay | Admitting: Cardiovascular Disease

## 2013-12-31 NOTE — Telephone Encounter (Signed)
New problem:    Pt not sure when she should come in for her next coumadin appointment.  Per pt she  stopped the coumadin and is not back on.

## 2013-12-31 NOTE — Telephone Encounter (Signed)
Returned call to pt, pt states she stopped Coumadin last week prior to hip injection today.  Will resume Coumadin today after injection.  Advised pt to recheck INR in 1 week after resuming Coumadin.  Made follow-up appt in Coumadin clinic on 01/07/14.

## 2013-12-31 NOTE — Telephone Encounter (Signed)
Received request from Nurse fax box, documents faxed for surgical clearance. To: [facility]  Fax number: [phone] Attention: [phone]/km

## 2014-01-07 ENCOUNTER — Ambulatory Visit (INDEPENDENT_AMBULATORY_CARE_PROVIDER_SITE_OTHER): Payer: Medicare Other

## 2014-01-07 DIAGNOSIS — I4891 Unspecified atrial fibrillation: Secondary | ICD-10-CM

## 2014-01-07 DIAGNOSIS — Z5181 Encounter for therapeutic drug level monitoring: Secondary | ICD-10-CM

## 2014-01-07 LAB — POCT INR: INR: 1.3

## 2014-01-14 ENCOUNTER — Encounter: Payer: Self-pay | Admitting: Cardiovascular Disease

## 2014-01-14 ENCOUNTER — Ambulatory Visit (INDEPENDENT_AMBULATORY_CARE_PROVIDER_SITE_OTHER): Payer: Medicare Other | Admitting: Cardiovascular Disease

## 2014-01-14 ENCOUNTER — Ambulatory Visit: Payer: Medicare Other | Admitting: Cardiovascular Disease

## 2014-01-14 ENCOUNTER — Ambulatory Visit (INDEPENDENT_AMBULATORY_CARE_PROVIDER_SITE_OTHER): Payer: Medicare Other | Admitting: *Deleted

## 2014-01-14 VITALS — BP 154/86 | HR 69 | Ht 66.0 in | Wt 196.8 lb

## 2014-01-14 DIAGNOSIS — Z5181 Encounter for therapeutic drug level monitoring: Secondary | ICD-10-CM

## 2014-01-14 DIAGNOSIS — I4891 Unspecified atrial fibrillation: Secondary | ICD-10-CM

## 2014-01-14 DIAGNOSIS — E785 Hyperlipidemia, unspecified: Secondary | ICD-10-CM

## 2014-01-14 DIAGNOSIS — I480 Paroxysmal atrial fibrillation: Secondary | ICD-10-CM

## 2014-01-14 DIAGNOSIS — R2681 Unsteadiness on feet: Secondary | ICD-10-CM

## 2014-01-14 DIAGNOSIS — R269 Unspecified abnormalities of gait and mobility: Secondary | ICD-10-CM

## 2014-01-14 LAB — POCT INR: INR: 2.2

## 2014-01-14 NOTE — Assessment & Plan Note (Signed)
Maint NSR  At some point with LE weakness and instability on feet may need to stop coumadin

## 2014-01-14 NOTE — Assessment & Plan Note (Signed)
Cholesterol is at goal.  Continue current dose of statin and diet Rx.  No myalgias or side effects.  F/U  LFT's in 6 months. Lab Results  Component Value Date   [facility]  Value: 101        Total Cholesterol/HDL:CHD Risk Coronary Heart Disease Risk Table                     Men   Women  1/2 Average Risk   3.4   3.3* 12/22/2006

## 2014-01-14 NOTE — Assessment & Plan Note (Signed)
F/U Dr [name]  She has a known LLE foot drop but seems to have a lot more tardokinesis and stiffness since I last saw her ? Parkinson's F/u with Dr [name]

## 2014-01-14 NOTE — Progress Notes (Signed)
Patient ID: KAOIR LOREE, female   DOB: [birth year], 73 y.o.   MRN: [id number] [name] is a 73 yo female with a history of paroxysmal atrial fibrillation on chronic Coumadin therapy. She has a history of chest discomfort. She had heart catheterization in Feb. 2002 that demonstrated normal coronary arteries and normal LV function. A Myoview study done in July 2009 demonstrated no ischemia. Her last echocardiogram done in July 2008 demonstrated an ejection fraction of 60%, mild LVH, mild LAE. 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 She's had multiple back surgeries. Seeing Dr Luiz Ochoa in a month. Has had some irritable bowel and is on probiotics   Reviewed echo from 12/14/10 Normal RV/LV function no significant valve disease  Reviewed LE venous duplex 12/07/10 no DVT or venous insuficiency  Carotids ok 6/15 no ICA stenosis   Back in water Rx for 4th back operation No other issues  Some dependant edema. Diuretic to be started after urodynamic testing  Has a real hard time ambulating with LE weakness and left foot drop  Sees [facility]  ROS: Denies fever, malais, weight loss, blurry vision, decreased visual acuity, cough, sputum, SOB, hemoptysis, pleuritic pain, palpitaitons, heartburn, abdominal pain, melena, lower extremity edema, claudication, or rash.  All other systems reviewed and negative  General: Some  Chronically ill white female  HEENT: normal Neck supple with no adenopathy JVP normal no bruits no thyromegaly Lungs clear with no wheezing and good diaphragmatic motion Heart:  S1/S2 no murmur, no rub, gallop or click PMI normal Abdomen: benighn, BS positve, no tenderness, no AAA no bruit.  No HSM or HJR Distal pulses intact with no bruits No edema Neuro non-focal Skin warm and dry LE weakness with LLE foot drop    Current  Outpatient Prescriptions  Medication Sig Dispense Refill  . Acetaminophen (TYLENOL PO) Take 2 tablets by mouth as directed.      . bimatoprost (LUMIGAN) 0.03 % ophthalmic drops Place 1 drop into both eyes at bedtime.       . Calcium Carbonate Antacid (TUMS E-X PO) Take by mouth.      . Cinnamon 500 MG capsule Take 500 mg by mouth daily. Pt take 2 capsules daily.      Marland Kitchen EPINEPHrine (EPI-PEN) 0.3 mg/0.3 mL SOAJ injection Inject into thigh if needed for severe allergic reaction  1 Device  prn  . fexofenadine (ALLEGRA) 180 MG tablet Take 180 mg by mouth daily as needed. For allergies      . furosemide (LASIX) 20 MG tablet Take 20 mg by mouth daily. As needed      . gabapentin (NEURONTIN) 300 MG capsule Take 300 mg by mouth 2 (two) times daily.      Marland Kitchen KRILL OIL PO Take by mouth.      . Linaclotide (LINZESS) 290 MCG CAPS Take 145 mcg by mouth as needed.       . magnesium 30 MG tablet Take 400 mg by mouth 2 (two) times daily.      . metoprolol succinate (TOPROL-XL) 100 MG 24 hr tablet TAKE 1 TABLET DAILY  90 tablet  0  . NON FORMULARY ALLERGY SHOTS WEEKLY      . pramipexole (MIRAPEX) 0.5 MG tablet Take 0.5 mg by mouth daily. For tremors      . pravastatin (PRAVACHOL) 40 MG tablet Take 40 mg  by mouth daily.        . traMADol (ULTRAM) 50 MG tablet Take 50 mg by mouth as needed.       . triamterene-hydrochlorothiazide (DYAZIDE) 37.5-25 MG per capsule Take 1 capsule by mouth daily.       Marland Kitchen warfarin (COUMADIN) 5 MG tablet Take as directed by Anticoagulation clinic  90 tablet  1   No current facility-administered medications for this visit.    Allergies  Relafen and Zocor  Electrocardiogram:  SR low voltage non change from 2014  Today  SR rate 69 low voltage no change  Assessment and Plan

## 2014-01-14 NOTE — Patient Instructions (Signed)
Your physician wants you to follow-up in:  6 MONTHS WITH DR NISHAN  You will receive a reminder letter in the mail two months in advance. If you don't receive a letter, please call our office to schedule the follow-up appointment. Your physician recommends that you continue on your current medications as directed. Please refer to the Current Medication list given to you today. 

## 2014-01-29 ENCOUNTER — Ambulatory Visit (INDEPENDENT_AMBULATORY_CARE_PROVIDER_SITE_OTHER): Payer: Medicare Other | Admitting: *Deleted

## 2014-01-29 ENCOUNTER — Other Ambulatory Visit: Payer: Self-pay

## 2014-01-29 DIAGNOSIS — I4891 Unspecified atrial fibrillation: Secondary | ICD-10-CM

## 2014-01-29 DIAGNOSIS — Z5181 Encounter for therapeutic drug level monitoring: Secondary | ICD-10-CM

## 2014-01-29 LAB — POCT INR: INR: 4.1

## 2014-01-29 MED ORDER — METOPROLOL SUCCINATE ER 100 MG PO TB24: ORAL_TABLET | ORAL | Status: DC

## 2014-01-31 ENCOUNTER — Encounter (HOSPITAL_COMMUNITY): Payer: Self-pay | Admitting: Emergency Medicine

## 2014-01-31 ENCOUNTER — Telehealth: Payer: Self-pay | Admitting: *Deleted

## 2014-01-31 ENCOUNTER — Emergency Department (HOSPITAL_COMMUNITY): Payer: Medicare Other

## 2014-01-31 ENCOUNTER — Inpatient Hospital Stay (HOSPITAL_COMMUNITY)
Admission: EM | Admit: 2014-01-31 | Discharge: 2014-02-03 | DRG: 309 | Disposition: A | Discharge status: Home / Self Care | Payer: Medicare Other | Attending: Cardiovascular Disease | Admitting: Cardiovascular Disease

## 2014-01-31 DIAGNOSIS — G4733 Obstructive sleep apnea (adult) (pediatric): Secondary | ICD-10-CM | POA: Diagnosis present

## 2014-01-31 DIAGNOSIS — R0789 Other chest pain: Secondary | ICD-10-CM

## 2014-01-31 DIAGNOSIS — R7989 Other specified abnormal findings of blood chemistry: Secondary | ICD-10-CM

## 2014-01-31 DIAGNOSIS — Z90710 Acquired absence of both cervix and uterus: Secondary | ICD-10-CM

## 2014-01-31 DIAGNOSIS — M1990 Unspecified osteoarthritis, unspecified site: Secondary | ICD-10-CM | POA: Diagnosis present

## 2014-01-31 DIAGNOSIS — I4891 Unspecified atrial fibrillation: Principal | ICD-10-CM | POA: Diagnosis present

## 2014-01-31 DIAGNOSIS — M4120 Other idiopathic scoliosis, site unspecified: Secondary | ICD-10-CM | POA: Diagnosis present

## 2014-01-31 DIAGNOSIS — K219 Gastro-esophageal reflux disease without esophagitis: Secondary | ICD-10-CM | POA: Diagnosis present

## 2014-01-31 DIAGNOSIS — R609 Edema, unspecified: Secondary | ICD-10-CM | POA: Diagnosis present

## 2014-01-31 DIAGNOSIS — I10 Essential (primary) hypertension: Secondary | ICD-10-CM | POA: Diagnosis present

## 2014-01-31 DIAGNOSIS — T502X5A Adverse effect of carbonic-anhydrase inhibitors, benzothiadiazides and other diuretics, initial encounter: Secondary | ICD-10-CM | POA: Diagnosis present

## 2014-01-31 DIAGNOSIS — E785 Hyperlipidemia, unspecified: Secondary | ICD-10-CM | POA: Diagnosis present

## 2014-01-31 DIAGNOSIS — R2681 Unsteadiness on feet: Secondary | ICD-10-CM

## 2014-01-31 DIAGNOSIS — R269 Unspecified abnormalities of gait and mobility: Secondary | ICD-10-CM | POA: Diagnosis present

## 2014-01-31 DIAGNOSIS — M48061 Spinal stenosis, lumbar region without neurogenic claudication: Secondary | ICD-10-CM | POA: Diagnosis present

## 2014-01-31 DIAGNOSIS — Z7901 Long term (current) use of anticoagulants: Secondary | ICD-10-CM

## 2014-01-31 DIAGNOSIS — N289 Disorder of kidney and ureter, unspecified: Secondary | ICD-10-CM | POA: Diagnosis present

## 2014-01-31 DIAGNOSIS — I5032 Chronic diastolic (congestive) heart failure: Secondary | ICD-10-CM | POA: Diagnosis present

## 2014-01-31 DIAGNOSIS — Z5181 Encounter for therapeutic drug level monitoring: Secondary | ICD-10-CM

## 2014-01-31 DIAGNOSIS — I509 Heart failure, unspecified: Secondary | ICD-10-CM | POA: Diagnosis present

## 2014-01-31 DIAGNOSIS — G8929 Other chronic pain: Secondary | ICD-10-CM | POA: Diagnosis present

## 2014-01-31 HISTORY — DX: Other chronic pain: G89.29

## 2014-01-31 HISTORY — DX: Obstructive sleep apnea (adult) (pediatric): G47.33

## 2014-01-31 HISTORY — DX: Unspecified osteoarthritis, unspecified site: M19.90

## 2014-01-31 HISTORY — DX: Dorsalgia, unspecified: M54.9

## 2014-01-31 LAB — CBC
HCT: 38.9 % (ref 36.0–46.0)
Hemoglobin: 12.4 g/dL (ref 12.0–15.0)
MCH: 28.8 pg (ref 26.0–34.0)
MCHC: 31.9 g/dL (ref 30.0–36.0)
MCV: 90.3 fL (ref 78.0–100.0)
Platelets: 136 10*3/uL — ABNORMAL LOW (ref 150–400)
RBC: 4.31 MIL/uL (ref 3.87–5.11)
RDW: 15.1 % (ref 11.5–15.5)
WBC: 4.6 10*3/uL (ref 4.0–10.5)

## 2014-01-31 LAB — I-STAT CHEM 8, ED
BUN: 27 mg/dL — ABNORMAL HIGH (ref 6–23)
Calcium, Ion: 1.18 mmol/L (ref 1.13–1.30)
Chloride: 104 mEq/L (ref 96–112)
Creatinine, Ser: 1.5 mg/dL — ABNORMAL HIGH (ref 0.50–1.10)
Glucose, Bld: 101 mg/dL — ABNORMAL HIGH (ref 70–99)
HCT: 37 % (ref 36.0–46.0)
Hemoglobin: 12.6 g/dL (ref 12.0–15.0)
Potassium: 3.9 mEq/L (ref 3.7–5.3)
Sodium: 140 mEq/L (ref 137–147)
TCO2: 27 mmol/L (ref 0–100)

## 2014-01-31 LAB — I-STAT TROPONIN, ED: Troponin i, poc: 0.06 ng/mL (ref 0.00–0.08)

## 2014-01-31 LAB — PROTIME-INR
INR: 2.62 — ABNORMAL HIGH (ref 0.00–1.49)
Prothrombin Time: 28 seconds — ABNORMAL HIGH (ref 11.6–15.2)

## 2014-01-31 MED ORDER — DILTIAZEM LOAD VIA INFUSION
10.0000 mg | Freq: Once | INTRAVENOUS | Status: AC
  Administered 2014-01-31: 10 mg via INTRAVENOUS
  Filled 2014-01-31: qty 10

## 2014-01-31 MED ORDER — DILTIAZEM HCL 100 MG IV SOLR
5.0000 mg/h | INTRAVENOUS | Status: DC
  Administered 2014-01-31: 5 mg/h via INTRAVENOUS

## 2014-01-31 MED ORDER — WARFARIN SODIUM 2.5 MG PO TABS
2.5000 mg | ORAL_TABLET | ORAL | Status: DC
  Administered 2014-02-01: 2.5 mg via ORAL
  Filled 2014-01-31 (×2): qty 1

## 2014-01-31 MED ORDER — WARFARIN SODIUM 5 MG PO TABS
5.0000 mg | ORAL_TABLET | ORAL | Status: DC
  Administered 2014-02-02: 5 mg via ORAL
  Filled 2014-01-31: qty 1

## 2014-01-31 MED ORDER — WARFARIN - PHARMACIST DOSING INPATIENT
Freq: Every day | Status: DC
  Administered 2014-02-01 – 2014-02-02 (×2)

## 2014-01-31 MED ORDER — SODIUM CHLORIDE 0.9 % IV BOLUS (SEPSIS)
500.0000 mL | Freq: Once | INTRAVENOUS | Status: AC
  Administered 2014-01-31: 500 mL via INTRAVENOUS

## 2014-01-31 NOTE — ED Provider Notes (Signed)
CSN: [id number]     Arrival date & time 01/31/14  1648 History   First MD Initiated Contact with Patient 01/31/14 1705     Chief Complaint  Patient presents with  . Atrial Fibrillation     (Consider location/radiation/quality/duration/timing/severity/associated sxs/prior Treatment) HPI Comments: Patient sent to the ER from family practice doctor's office for evaluation of atrial fibrillation with rapid ventricular response. Patient reports that she does have a previous history of atrial fibrillation, hasn't had any problems in about 6 years. She sees Hydrologist.   Patient reports waking up yesterday and feeling "like a bowl of Jell-O". She has been extremely weak since waking yesterday. She has felt like her heart has been skipping and racing at times. She tried to check her blood pressure at home but had difficulty, but her cough was not working. She went to her doctor's office to check her blood pressure was found to be in atrial fibrillation with rapid ventricular response. She was sent to the for further evaluation. Patient is experiencing borderline hypotension. She has not had any chest pain.  Patient is a 73 y.o. female presenting with atrial fibrillation.  Atrial Fibrillation Pertinent negatives include no chest pain and no shortness of breath.    Past Medical History  Diagnosis Date  . CHF (congestive heart failure)   . PAF (paroxysmal atrial fibrillation)   . Hypertension   . DJD (degenerative joint disease)   . Allergic rhinitis   . Hyperlipidemia   . Lumbar stenosis   . Spondylosis   . Scoliosis     with radiculopathy L2-S1 with prior surgery  . Small bowel obstruction     versus ileus after last bck surgery  . Sleep apnea     no cpap now  . GERD (gastroesophageal reflux disease)    Past Surgical History  Procedure Laterality Date  . Septoplasty  80's  . Total abdominal hysterectomy    . Lipoma excision      bil  . Lumbar disc surgery      x2   spinal leak   once   . Repair spinal fluid leak  10  . Cataract extraction  11    bil  . Eye surgery     Family History  Problem Relation Age of Onset  . Arthritis Mother   . Heart attack Father   . Hypertension Sister   . Asthma Sister   . Allergies Sister    History  Substance Use Topics  . Smoking status: Never Smoker   . Smokeless tobacco: Never Used  . Alcohol Use: No     Comment: Caffeine on rare occ   OB History   Grav Para Term Preterm Abortions TAB SAB Ect Mult Living                 Review of Systems  Constitutional: Positive for fatigue.  Respiratory: Negative for shortness of breath.   Cardiovascular: Positive for palpitations. Negative for chest pain.  All other systems reviewed and are negative.     Allergies  Relafen and Zocor  Home Medications   Prior to Admission medications   Medication Sig Start Date End Date Taking? Authorizing Provider  Acetaminophen (TYLENOL PO) Take 2 tablets by mouth as directed. 09/15/12   Historical Provider, MD  bimatoprost (LUMIGAN) 0.03 % ophthalmic drops Place 1 drop into both eyes at bedtime.     Historical Provider, MD  Calcium Carbonate Antacid (TUMS E-X PO) Take by mouth.    Historical  Provider, MD  Cinnamon 500 MG capsule Take 500 mg by mouth daily. Pt take 2 capsules daily.    Historical Provider, MD  EPINEPHrine (EPI-PEN) 0.3 mg/0.3 mL SOAJ injection Inject into thigh if needed for severe allergic reaction 04/17/13   Deneise Lever, MD  fexofenadine (ALLEGRA) 180 MG tablet Take 180 mg by mouth daily as needed. For allergies    Historical Provider, MD  furosemide (LASIX) 20 MG tablet Take 20 mg by mouth daily. As needed    Historical Provider, MD  gabapentin (NEURONTIN) 300 MG capsule Take 300 mg by mouth 2 (two) times daily.    Historical Provider, MD  KRILL OIL PO Take by mouth.    Historical Provider, MD  Linaclotide (LINZESS) 290 MCG CAPS Take 145 mcg by mouth as needed.     Historical Provider, MD  magnesium 30 MG tablet  Take 400 mg by mouth 2 (two) times daily.    Historical Provider, MD  metoprolol succinate (TOPROL-XL) 100 MG 24 hr tablet TAKE 1 TABLET DAILY 01/29/14   Josue Hector, MD  NON FORMULARY ALLERGY SHOTS WEEKLY    Historical Provider, MD  pramipexole (MIRAPEX) 0.5 MG tablet Take 0.5 mg by mouth daily. For tremors    Historical Provider, MD  pravastatin (PRAVACHOL) 40 MG tablet Take 40 mg by mouth daily.      Historical Provider, MD  traMADol (ULTRAM) 50 MG tablet Take 50 mg by mouth as needed.  03/12/13   Historical Provider, MD  triamterene-hydrochlorothiazide (DYAZIDE) 37.5-25 MG per capsule Take 1 capsule by mouth daily.     Historical Provider, MD  warfarin (COUMADIN) 5 MG tablet Take as directed by Anticoagulation clinic 07/25/13   Josue Hector, MD   BP 116/78  Pulse 128  Temp(Src) 98.8 F (37.1 C) (Oral)  Resp 20  Ht 5' 5.5" (1.664 m)  Wt 196 lb 12 oz (89.245 kg)  BMI 32.23 kg/m2  SpO2 96% Physical Exam  Constitutional: She is oriented to person, place, and time. She appears well-developed and well-nourished. No distress.  HENT:  Head: Normocephalic and atraumatic.  Right Ear: Hearing normal.  Left Ear: Hearing normal.  Nose: Nose normal.  Mouth/Throat: Oropharynx is clear and moist and mucous membranes are normal.  Eyes: Conjunctivae and EOM are normal. Pupils are equal, round, and reactive to light.  Neck: Normal range of motion. Neck supple.  Cardiovascular: S1 normal and S2 normal.  An irregularly irregular rhythm present. Tachycardia present.  Exam reveals no gallop and no friction rub.   No murmur heard. Pulmonary/Chest: Effort normal and breath sounds normal. No respiratory distress. She exhibits no tenderness.  Abdominal: Soft. Normal appearance and bowel sounds are normal. There is no hepatosplenomegaly. There is no tenderness. There is no rebound, no guarding, no tenderness at McBurney's point and negative Murphy's sign. No hernia.  Musculoskeletal: Normal range of  motion.  Neurological: She is alert and oriented to person, place, and time. She has normal strength. No cranial nerve deficit or sensory deficit. Coordination normal. GCS eye subscore is 4. GCS verbal subscore is 5. GCS motor subscore is 6.  Skin: Skin is warm, dry and intact. No rash noted. No cyanosis.  Psychiatric: She has a normal mood and affect. Her speech is normal and behavior is normal. Thought content normal.    ED Course  Procedures (including critical care time) Labs Review Labs Reviewed  [place], ED  I-STAT CHEM 8, ED    Imaging Review  No results found.   EKG Interpretation   Date/Time:  Thursday January 31 2014 16:52:40 EDT Ventricular Rate:  123 PR Interval:    QRS Duration: 70 QT Interval:  290 QTC Calculation: 415 R Axis:   -14 Text Interpretation:  Atrial fibrillation with rapid ventricular response  Low voltage QRS Cannot rule out Anterior infarct , age undetermined  Abnormal ECG AFIB new since prior tracing, o/w no significant abnormality  Confirmed by Jeremie Abdelaziz  MD, Domingo Fuson [id number]) on 01/31/2014 5:20:09 PM      MDM   Final diagnoses:  None  Atrial fibrillation rapid ventricular response  Patient presented to the ER for evaluation of weakness, palpitations with atrial fibrillation and a rapid ventricular response. The patient saw her primary care doctor because of weakness and palpitations, was seen to have a fast heart beat was sent to the ER. EKG did show atrial fibrillation without any ischemic changes. The patient was hypotensive. This is likely the symptomatic weakness she was experiencing. She was given a fluid bolus followed by Cardizem. Heart rate has significantly improved the blood pressure is improved. She is still in sinus rhythm, however. Discussed with Doctor Radford Pax, cardiology. She will see the patient in the ER.    Orpah Greek, MD 01/31/14 2139

## 2014-01-31 NOTE — H&P (Signed)
Admit date: 01/31/2014 Referring Physician: Dr. Betsey Holiday Primary Cardiologist:  [name] Chief complaint/reason for admission:afib with RVR  HPI: This is a 73yo female with a history of HTN, PAF and CHF who was in her USOH until today when she presented to her PCP with complaints of weakness and palpitations.  She apparently awakened yesterday feeling weak and felt her heart racing and skipping.  She went to her PCP and was found to be in afib with RVR and was sent to the ER.  In the ER she was borderline hypotensive and was given IV Cardizem and HR is now in the 80's.  She has not had any problems with afib for at least 5 years.  She states that she had some chest tightness last yesterday and today and that she usually gets chest tightness whenn she gets afib.  She also complains of SOB over the past 2 days.  She has chronic LE edema despite diuretic use.    PMH:    Past Medical History  Diagnosis Date  . CHF (congestive heart failure)   . PAF (paroxysmal atrial fibrillation)   . Hypertension   . DJD (degenerative joint disease)   . Allergic rhinitis   . Hyperlipidemia   . Lumbar stenosis   . Spondylosis   . Scoliosis     with radiculopathy L2-S1 with prior surgery  . Small bowel obstruction     versus ileus after last bck surgery  . Sleep apnea     no cpap now  . GERD (gastroesophageal reflux disease)     PSH:    Past Surgical History  Procedure Laterality Date  . Septoplasty  80's  . Total abdominal hysterectomy    . Lipoma excision      bil  . Lumbar disc surgery      x2   spinal leak  once   . Repair spinal fluid leak  10  . Cataract extraction  11    bil  . Eye surgery      ALLERGIES:   Lyrica; Zocor; and Relafen  Prior to Admit Meds:   (Not in a hospital admission) Family HX:    Family History  Problem Relation Age of Onset  . Arthritis Mother   . Heart attack Father   . Hypertension Sister   . Asthma Sister   . Allergies Sister    Social HX:    History    Social History  . Marital Status: Married    Spouse Name: [name]    Number of Children: 2  . Years of Education: 12th    Occupational History  . retired     Radio producer work   Social History Main Topics  . Smoking status: Never Smoker   . Smokeless tobacco: Never Used  . Alcohol Use: No     Comment: Caffeine on rare occ  . Drug Use: No  . Sexual Activity: Not on file     Comment: hysterectomy   Other Topics Concern  . Not on file   Social History Narrative   Patient lives at home with husband [name].   Patient is retired.   Patient has a high school education.    Patient has 2 children      ROS:  All 11 ROS were addressed and are negative except what is stated in the HPI  PHYSICAL EXAM Filed Vitals:   01/31/14 2135  BP: 109/48  Pulse: 84  Temp:   Resp: 14   General: Well  developed, well nourished, in no acute distress Head: Eyes PERRLA, No xanthomas.   Normal cephalic and atramatic  Lungs:   Clear bilaterally to auscultation and percussion. Heart:   Irregularly irregular S1 S2 Pulses are 2+ & equal.            No carotid bruit. No JVD.  No abdominal bruits. No femoral bruits. Abdomen: Bowel sounds are positive, abdomen soft and non-tender without masses  Extremities:   No clubbing, cyanosis .  DP +1, 2+ edema Neuro: Alert and oriented X 3. Psych:  Good affect, responds appropriately   Labs:   Lab Results  Component Value Date   WBC 4.6 01/31/2014   HGB 12.6 01/31/2014   HCT 37.0 01/31/2014   MCV 90.3 01/31/2014   PLT 136* 01/31/2014    Recent Labs Lab 01/31/14 1802  NA 140  K 3.9  CL 104  BUN 27*  CREATININE 1.50*  GLUCOSE 101*   Lab Results  Component Value Date   CKTOTAL 124 12/21/2006   CKMB 3.0 12/21/2006   TROPONINI  Value: 0.07        PERSISTENTLY INCREASED TROPONIN VALUES IN THE RANGE OF 0.06-0.49 ng/mL CAN BE SEEN IN:       -UNSTABLE ANGINA* 12/21/2006   No results found for this basename: PTT   Lab Results  Component Value Date    INR 2.62* 01/31/2014   INR 4.1 01/29/2014   INR 2.2 01/14/2014   PROTIME 18.4 11/22/2008   PROTIME 17.5 11/01/2008     Lab Results  Component Value Date   CHOL  Value: 183        ATP III CLASSIFICATION:  <200     mg/dL   Desirable  200-239  mg/dL   Borderline High  >=240    mg/dL   High 12/22/2006   Lab Results  Component Value Date   HDL 47 12/22/2006   Lab Results  Component Value Date   LDLCALC  Value: 101        Total Cholesterol/HDL:CHD Risk Coronary Heart Disease Risk Table                     Men   Women  1/2 Average Risk   3.4   3.3* 12/22/2006   Lab Results  Component Value Date   TRIG 174* 12/22/2006   Lab Results  Component Value Date   CHOLHDL 3.9 12/22/2006   No results found for this basename: LDLDIRECT      Radiology:  No results found.  EKG:  Atrial fibrillation with RVR , anterior infarct old  ASSESSMENT:  1.  New onset atrial fibrillation with RVR now rate controlled on IV Cardizem gtt 2.  Chronic systemic anticoagulation 3.  HTN with borderline soft BP while in rapid rhythm 4.  History of diastolic CHF with normal LVF on echo in 2012 5.  Acute renal insufficiency possible secondary to borderline hypotension and prerenal azotemia from diuretic use.  6.  Chest pressure in setting of afib with RVR - she says she gets CP every time she has PAF.  PLAN:   1.  Admit to tele bed 2.  Continue IV Cardizem gtt 3.  Check TSH 4.  Check 2D echo in as to assess LVF and LA size 5.  Continue Toprol as BP tolerates 6.  Hold Maxide for now due to soft BP 7.  Continue Warfarin with management per pharmacy - consider changing to NOAC since this is nonvalvular afib 8.  Cycle cardiac enzymes 9.  Consider Leane Call due to chest tightness pending results of echo and enzymes  Sueanne Margarita, MD  01/31/2014  9:45 PM

## 2014-01-31 NOTE — ED Notes (Signed)
Presents from [facility] practice with Atrial fib and complaints of feeling tired, fatigued and weak.  Feeling weak since Tuesday, denies CP and SOB. Heart rate between 120-140.

## 2014-01-31 NOTE — Telephone Encounter (Signed)
Received call from [facility] at [facility] Patient came in for BP check which was 128/88. Heart rate was high 126 to 147 so they checked an EKG and her rhythm was atrial fib.    Still on Coumadin and Metoprolol every day. Patient denies chest pain. Because her rate was so fast I advised Anderson Malta to have the patient transported to the [facility] ER.

## 2014-01-31 NOTE — Progress Notes (Signed)
ANTICOAGULATION CONSULT NOTE - Initial Consult  Pharmacy Consult for Coumadin Indication: atrial fibrillation  Allergies  Allergen Reactions  . Lyrica [Pregabalin] Other (See Comments)    Felt loopy  . Zocor [Simvastatin] Other (See Comments)    Myalgias  . Relafen [Nabumetone] Rash    Patient Measurements: Height: 5' 5.5" (166.4 cm) Weight: 196 lb 12 oz (89.245 kg) IBW/kg (Calculated) : 58.15   Vital Signs: Temp: 98.8 F (37.1 C) (08/27 1656) Temp src: Oral (08/27 1656) BP: 123/67 mmHg (08/27 2315) Pulse Rate: 89 (08/27 2315)  Labs:  Recent Labs  01/29/14 1220 01/31/14 1701 01/31/14 1802  HGB  --  12.4 12.6  HCT  --  38.9 37.0  PLT  --  136*  --   LABPROT  --  28.0*  --   INR 4.1 2.62*  --   CREATININE  --   --  1.50*    Estimated Creatinine Clearance: 38.3 ml/min (by C-G formula based on Cr of 1.5).   Medical History: Past Medical History  Diagnosis Date  . CHF (congestive heart failure)   . PAF (paroxysmal atrial fibrillation)   . Hypertension   . DJD (degenerative joint disease)   . Allergic rhinitis   . Hyperlipidemia   . Lumbar stenosis   . Spondylosis   . Scoliosis     with radiculopathy L2-S1 with prior surgery  . Small bowel obstruction     versus ileus after last bck surgery  . Sleep apnea     no cpap now  . GERD (gastroesophageal reflux disease)     Assessment: 73yo female was in Afib w/ HR to 140s at primary care office, cards advised pt to go to ED, to continue Coumadin during admission; current INR therapeutic.  Goal of Therapy:  INR 2-3   Plan:  Will continue home Coumadin dose of 5mg  Tue/Sat and 2.5mg  MWTFSun and monitor INR for dose adjustments.  Wynona Neat, PharmD, BCPS  01/31/2014,11:39 PM

## 2014-02-01 ENCOUNTER — Encounter (HOSPITAL_COMMUNITY): Payer: Self-pay | Admitting: General Practice

## 2014-02-01 DIAGNOSIS — K219 Gastro-esophageal reflux disease without esophagitis: Secondary | ICD-10-CM | POA: Diagnosis present

## 2014-02-01 DIAGNOSIS — I5032 Chronic diastolic (congestive) heart failure: Secondary | ICD-10-CM | POA: Diagnosis present

## 2014-02-01 DIAGNOSIS — I4891 Unspecified atrial fibrillation: Secondary | ICD-10-CM | POA: Diagnosis present

## 2014-02-01 DIAGNOSIS — N289 Disorder of kidney and ureter, unspecified: Secondary | ICD-10-CM | POA: Diagnosis present

## 2014-02-01 DIAGNOSIS — T502X5A Adverse effect of carbonic-anhydrase inhibitors, benzothiadiazides and other diuretics, initial encounter: Secondary | ICD-10-CM | POA: Diagnosis present

## 2014-02-01 DIAGNOSIS — I10 Essential (primary) hypertension: Secondary | ICD-10-CM | POA: Diagnosis present

## 2014-02-01 DIAGNOSIS — I509 Heart failure, unspecified: Secondary | ICD-10-CM | POA: Diagnosis present

## 2014-02-01 DIAGNOSIS — I369 Nonrheumatic tricuspid valve disorder, unspecified: Secondary | ICD-10-CM

## 2014-02-01 DIAGNOSIS — Z7901 Long term (current) use of anticoagulants: Secondary | ICD-10-CM | POA: Diagnosis not present

## 2014-02-01 DIAGNOSIS — G8929 Other chronic pain: Secondary | ICD-10-CM | POA: Diagnosis present

## 2014-02-01 DIAGNOSIS — G4733 Obstructive sleep apnea (adult) (pediatric): Secondary | ICD-10-CM | POA: Diagnosis present

## 2014-02-01 DIAGNOSIS — R609 Edema, unspecified: Secondary | ICD-10-CM | POA: Diagnosis present

## 2014-02-01 DIAGNOSIS — M1990 Unspecified osteoarthritis, unspecified site: Secondary | ICD-10-CM | POA: Diagnosis present

## 2014-02-01 DIAGNOSIS — Z90710 Acquired absence of both cervix and uterus: Secondary | ICD-10-CM | POA: Diagnosis not present

## 2014-02-01 DIAGNOSIS — M48061 Spinal stenosis, lumbar region without neurogenic claudication: Secondary | ICD-10-CM | POA: Diagnosis present

## 2014-02-01 DIAGNOSIS — M4120 Other idiopathic scoliosis, site unspecified: Secondary | ICD-10-CM | POA: Diagnosis present

## 2014-02-01 DIAGNOSIS — R269 Unspecified abnormalities of gait and mobility: Secondary | ICD-10-CM | POA: Diagnosis present

## 2014-02-01 DIAGNOSIS — E785 Hyperlipidemia, unspecified: Secondary | ICD-10-CM | POA: Diagnosis present

## 2014-02-01 LAB — CBC WITH DIFFERENTIAL/PLATELET
Basophils Absolute: 0 10*3/uL (ref 0.0–0.1)
Basophils Relative: 0 % (ref 0–1)
Eosinophils Absolute: 0.1 10*3/uL (ref 0.0–0.7)
Eosinophils Relative: 2 % (ref 0–5)
HCT: 35.7 % — ABNORMAL LOW (ref 36.0–46.0)
Hemoglobin: 11.5 g/dL — ABNORMAL LOW (ref 12.0–15.0)
Lymphocytes Relative: 26 % (ref 12–46)
Lymphs Abs: 1.1 10*3/uL (ref 0.7–4.0)
MCH: 28.5 pg (ref 26.0–34.0)
MCHC: 32.2 g/dL (ref 30.0–36.0)
MCV: 88.4 fL (ref 78.0–100.0)
Monocytes Absolute: 0.3 10*3/uL (ref 0.1–1.0)
Monocytes Relative: 7 % (ref 3–12)
Neutro Abs: 2.8 10*3/uL (ref 1.7–7.7)
Neutrophils Relative %: 64 % (ref 43–77)
Platelets: 119 10*3/uL — ABNORMAL LOW (ref 150–400)
RBC: 4.04 MIL/uL (ref 3.87–5.11)
RDW: 15.2 % (ref 11.5–15.5)
WBC: 4.3 10*3/uL (ref 4.0–10.5)

## 2014-02-01 LAB — COMPREHENSIVE METABOLIC PANEL
ALT: 23 U/L (ref 0–35)
AST: 17 U/L (ref 0–37)
Albumin: 3.3 g/dL — ABNORMAL LOW (ref 3.5–5.2)
Alkaline Phosphatase: 61 U/L (ref 39–117)
Anion gap: 14 (ref 5–15)
BUN: 21 mg/dL (ref 6–23)
CO2: 23 mEq/L (ref 19–32)
Calcium: 8.7 mg/dL (ref 8.4–10.5)
Chloride: 105 mEq/L (ref 96–112)
Creatinine, Ser: 1.1 mg/dL (ref 0.50–1.10)
GFR calc Af Amer: 57 mL/min — ABNORMAL LOW (ref >90)
GFR calc non Af Amer: 49 mL/min — ABNORMAL LOW (ref >90)
Glucose, Bld: 86 mg/dL (ref 70–99)
Potassium: 3.9 mEq/L (ref 3.7–5.3)
Sodium: 142 mEq/L (ref 137–147)
Total Bilirubin: 0.4 mg/dL (ref 0.3–1.2)
Total Protein: 5.7 g/dL — ABNORMAL LOW (ref 6.0–8.3)

## 2014-02-01 LAB — BASIC METABOLIC PANEL
Anion gap: 11 (ref 5–15)
BUN: 21 mg/dL (ref 6–23)
CO2: 24 mEq/L (ref 19–32)
Calcium: 8.7 mg/dL (ref 8.4–10.5)
Chloride: 110 mEq/L (ref 96–112)
Creatinine, Ser: 1.11 mg/dL — ABNORMAL HIGH (ref 0.50–1.10)
GFR calc Af Amer: 57 mL/min — ABNORMAL LOW (ref >90)
GFR calc non Af Amer: 49 mL/min — ABNORMAL LOW (ref >90)
Glucose, Bld: 95 mg/dL (ref 70–99)
Potassium: 4.1 mEq/L (ref 3.7–5.3)
Sodium: 145 mEq/L (ref 137–147)

## 2014-02-01 LAB — PROTIME-INR
INR: 2.7 — ABNORMAL HIGH (ref 0.00–1.49)
INR: 2.43 — ABNORMAL HIGH (ref 0.00–1.49)
Prothrombin Time: 28.7 seconds — ABNORMAL HIGH (ref 11.6–15.2)
Prothrombin Time: 26.4 seconds — ABNORMAL HIGH (ref 11.6–15.2)

## 2014-02-01 LAB — TSH: TSH: 2.35 u[IU]/mL (ref 0.350–4.500)

## 2014-02-01 LAB — APTT: aPTT: 38 seconds — ABNORMAL HIGH (ref 24–37)

## 2014-02-01 LAB — TROPONIN I
Troponin I: <0.3 ng/mL (ref <0.30)
Troponin I: <0.3 ng/mL (ref <0.30)

## 2014-02-01 LAB — PRO B NATRIURETIC PEPTIDE: Pro B Natriuretic peptide (BNP): 4386 pg/mL — ABNORMAL HIGH (ref 0–125)

## 2014-02-01 MED ORDER — PRAMIPEXOLE DIHYDROCHLORIDE 0.25 MG PO TABS
0.5000 mg | ORAL_TABLET | Freq: Every day | ORAL | Status: DC
  Administered 2014-02-01 – 2014-02-02 (×3): 0.5 mg via ORAL
  Filled 2014-02-01 (×4): qty 2

## 2014-02-01 MED ORDER — DEXTROSE 5 % IV SOLN
5.0000 mg/h | INTRAVENOUS | Status: DC
  Administered 2014-02-01 – 2014-02-02 (×2): 5 mg/h via INTRAVENOUS
  Filled 2014-02-01 (×2): qty 100

## 2014-02-01 MED ORDER — WARFARIN SODIUM 2.5 MG PO TABS: 2.5000 mg | ORAL_TABLET | Freq: Every day | ORAL | Status: DC

## 2014-02-01 MED ORDER — GABAPENTIN 300 MG PO CAPS
300.0000 mg | ORAL_CAPSULE | Freq: Two times a day (BID) | ORAL | Status: DC
  Administered 2014-02-01 – 2014-02-03 (×6): 300 mg via ORAL
  Filled 2014-02-01 (×7): qty 1

## 2014-02-01 MED ORDER — ACETAMINOPHEN 325 MG PO TABS
650.0000 mg | ORAL_TABLET | ORAL | Status: DC | PRN
  Administered 2014-02-01: 650 mg via ORAL
  Filled 2014-02-01: qty 2

## 2014-02-01 MED ORDER — LATANOPROST 0.005 % OP SOLN
1.0000 [drp] | Freq: Every day | OPHTHALMIC | Status: DC
  Administered 2014-02-01 – 2014-02-02 (×3): 1 [drp] via OPHTHALMIC
  Filled 2014-02-01: qty 2.5

## 2014-02-01 MED ORDER — METOPROLOL SUCCINATE ER 100 MG PO TB24
100.0000 mg | ORAL_TABLET | Freq: Every day | ORAL | Status: DC
  Administered 2014-02-01 – 2014-02-03 (×3): 100 mg via ORAL
  Filled 2014-02-01 (×3): qty 1

## 2014-02-01 MED ORDER — ONDANSETRON HCL 4 MG/2ML IJ SOLN
4.0000 mg | Freq: Four times a day (QID) | INTRAMUSCULAR | Status: DC | PRN
Start: 2014-02-01 — End: 2014-02-03

## 2014-02-01 NOTE — Progress Notes (Signed)
  Echocardiogram 2D Echocardiogram has been performed.  [name] 02/01/2014, 10:32 AM

## 2014-02-01 NOTE — Care Management Note (Unsigned)
    Page 1 of 1   02/01/2014     4:49:52 PM CARE MANAGEMENT NOTE 02/01/2014  Patient:  [name], [name]   Account Number:  [id number]  Date Initiated:  02/01/2014  Documentation initiated by:  Jimi Schappert  Subjective/Objective Assessment:   Pt adm on 01/31/14 with Afib with RVR.  PTA, pt independent of ADLS.     Action/Plan:   Will follow for dc needs as pt progresses.   Anticipated DC Date:  02/03/2014   Anticipated DC Plan:  [place]  CM consult      Choice offered to / List presented to:             Status of service:  In process, will continue to follow Medicare Important Message given?   (If response is "NO", the following Medicare IM given date fields will be blank) Date Medicare IM given:   Medicare IM given by:   Date Additional Medicare IM given:   Additional Medicare IM given by:    Discharge Disposition:    Per UR Regulation:  Reviewed for med. necessity/level of care/duration of stay  If discussed at [place] of Stay Meetings, dates discussed:    Comments:

## 2014-02-01 NOTE — Progress Notes (Signed)
Patient Name: [name] Date of Encounter: 02/01/2014     Principal Problem:   Atrial fibrillation with RVR Active Problems:   HYPERLIPIDEMIA   DEGENERATIVE JOINT DISEASE   Obstructive sleep apnea   Edema   Gait instability    SUBJECTIVE  Denies any CP or SOB. Feeling ok. Her back pain continue to bother her.   CURRENT MEDS . gabapentin  300 mg Oral BID  . latanoprost  1 drop Both Eyes QHS  . metoprolol succinate  100 mg Oral Daily  . pramipexole  0.5 mg Oral QHS  . warfarin  2.5 mg Oral Once per day on Sun Mon Wed Thu Fri  . [START ON 02/02/2014] warfarin  5 mg Oral Once per day on Tue Sat  . Warfarin - Pharmacist Dosing Inpatient   Does not apply q1800    OBJECTIVE  Filed Vitals:   01/31/14 2315 01/31/14 2354 02/01/14 0543 02/01/14 0640  BP: 123/67 131/73 108/53 101/58  Pulse: 89 75 82 70  Temp:  98.3 F (36.8 C) 97.6 F (36.4 C)   TempSrc:  Oral Oral   Resp: 14 20 18    Height:  5' 5.5" (1.664 m)    Weight:  204 lb 2.3 oz (92.6 kg)    SpO2: 95% 99% 95%    No intake or output data in the 24 hours ending 02/01/14 0913 Filed Weights   01/31/14 1656 01/31/14 2354  Weight: 196 lb 12 oz (89.245 kg) 204 lb 2.3 oz (92.6 kg)    PHYSICAL EXAM  General: Pleasant, NAD. Neuro: Alert and oriented X 3. Moves all extremities spontaneously. Psych: Normal affect. HEENT:  Normal  Neck: Supple without bruits or JVD. Lungs:  Resp regular and unlabored, anterior exam CTA. Heart: irregular no s3, s4, or murmurs. Abdomen: Soft, non-tender, non-distended, BS + x 4.  Extremities: No clubbing, cyanosis. DP/PT/Radials 2+ and equal bilaterally. 1+ pitting edema in LE  Accessory Clinical Findings  CBC  Recent Labs  01/31/14 1701 01/31/14 1802 02/01/14 0714  WBC 4.6  --  4.3  NEUTROABS  --   --  2.8  HGB 12.4 12.6 11.5*  HCT 38.9 37.0 35.7*  MCV 90.3  --  88.4  PLT 136*  --  PENDING   Basic Metabolic Panel  Recent Labs  02/01/14 0540 02/01/14 0714   NA 145 142  K 4.1 3.9  CL 110 105  CO2 24 23  GLUCOSE 95 86  BUN 21 21  CREATININE 1.11* 1.10  CALCIUM 8.7 8.7   Cardiac Enzymes  Recent Labs  02/01/14 0714  TROPONINI <0.30    TELE Atrial fibrillation with HR 80s    ECG A-fib with RVR with HR 120s   Echocardiogram  12/14/2010 Study Conclusions  - Left ventricle: The cavity size was normal. There was mild concentric hypertrophy. Systolic function was normal. The estimated ejection fraction was in the range of 55% to 60%. Wall motion was normal; there were no regional wall motion abnormalities. - Atrial septum: No defect or patent foramen ovale was identified.      Radiology/Studies  Dg Chest Port 1 View  01/31/2014   CLINICAL DATA:  Chest discomfort, atrial fibrillation  EXAM: PORTABLE CHEST - 1 VIEW  COMPARISON:  10/08/2011  FINDINGS: Lungs are clear.  No pleural effusion or pneumothorax.  The heart is top-normal in size.  IMPRESSION: No evidence of acute cardiopulmonary disease.   Electronically Signed   By: Julian Hy M.D.   On: 01/31/2014 17:18  ASSESSMENT AND PLAN  1. A-fib with RVR  - rate controlled on IV cardizem  - pending echo today, pending TSH  - may use echo to guide further treatment, hesitant to transition to PO diltiazem along with her long acting metoprolol 100mg  as patient has several long pauses on telemetry while on 5mg  IV dilt, longest one measuring 3.0 sec, however patient was largely asymptomatic  - if LA size not too dilated, may try Cardioversion +/- TEE (last subtherapeutic INR 3 wks ago, sx started 2 days ago) and antiarrythmic med while continue on metoprolol only. No immediate indicate for PPM. Will discuss treatment plan with Dr. Percival Spanish  - discussed with patient benefit and risk of NOAC, however patient states she walks with a cane and has balance issues, would continue on coumadin at this time  - per Dr. Radford Pax, consider stress test with patient's chest discomfort, per  patient, her chest discomfort only occur during a-fib, would continue to trend enzyme for now. Suspicion for ACS low, would do outpatient stress test later.  2. HTN 3. Chronic diastolic HF  - triamterene and HCTZ stopped to allow BP room for diltiazem gtt, also on PRN lasix at home  4. OSA 5. elevated proBNP in the setting of a-fib  - CXR no acute process  Signed, Woodward Ku Pager: [phone]  History and all data above reviewed.  Patient examined.  I agree with the findings as above. Not clear how long she has been in fib.  I suspect days.  She has been on warfarin but it was subtherapeutic on 8/3 and not therapeutic until 8/10.  It has been therapeutic since then.    The patient exam reveals SEL:TRVUY  ,  Lungs: Clear  ,  Abd: Positive bowel sounds, no rebound no guarding, Ext Mild edema  .  All available labs, radiology testing, previous records reviewed. Agree with documented assessment and plan.  Atrial fib:  She cannot be cardioverted today as she has not had a therapeutic INR greater than 3 weeks.  She could be converted early next week without a TEE.  Today we will await the echo results.  Continue IV cardizem today.  No evidence of ischemia but enzymes still trending.     Jeneen Rinks Teliyah Royal  11:17 AM  02/01/2014

## 2014-02-01 NOTE — Discharge Instructions (Signed)
Information on my medicine - Coumadin®   (Warfarin) ° °This medication education was reviewed with me or my healthcare representative as part of my discharge preparation.  The pharmacist that spoke with me during my hospital stay was:  Sewell Pitner Stillinger, RPH ° °Why was Coumadin prescribed for you? °Coumadin was prescribed for you because you have a blood clot or a medical condition that can cause an increased risk of forming blood clots. Blood clots can cause serious health problems by blocking the flow of blood to the heart, lung, or brain. Coumadin can prevent harmful blood clots from forming. °As a reminder your indication for Coumadin is:   Stroke Prevention Because Of Atrial Fibrillation ° °What test will check on my response to Coumadin? °While on Coumadin (warfarin) you will need to have an INR test regularly to ensure that your dose is keeping you in the desired range. The INR (international normalized ratio) number is calculated from the result of the laboratory test called prothrombin time (PT). ° °If an INR APPOINTMENT HAS NOT ALREADY BEEN MADE FOR YOU please schedule an appointment to have this lab work done by your health care provider within 7 days. °Your INR goal is usually a number between:  2 to 3 or your provider may give you a more narrow range like 2-2.5.  Ask your health care provider during an office visit what your goal INR is. ° °What  do you need to  know  About  COUMADIN? °Take Coumadin (warfarin) exactly as prescribed by your healthcare provider about the same time each day.  DO NOT stop taking without talking to the doctor who prescribed the medication.  Stopping without other blood clot prevention medication to take the place of Coumadin may increase your risk of developing a new clot or stroke.  Get refills before you run out. ° °What do you do if you miss a dose? °If you miss a dose, take it as soon as you remember on the same day then continue your regularly scheduled  regimen the next day.  Do not take two doses of Coumadin at the same time. ° °Important Safety Information °A possible side effect of Coumadin (Warfarin) is an increased risk of bleeding. You should call your healthcare provider right away if you experience any of the following: °  Bleeding from an injury or your nose that does not stop. °  Unusual colored urine (red or dark brown) or unusual colored stools (red or black). °  Unusual bruising for unknown reasons. °  A serious fall or if you hit your head (even if there is no bleeding). ° °Some foods or medicines interact with Coumadin® (warfarin) and might alter your response to warfarin. To help avoid this: °  Eat a balanced diet, maintaining a consistent amount of Vitamin K. °  Notify your provider about major diet changes you plan to make. °  Avoid alcohol or limit your intake to 1 drink for women and 2 drinks for men per day. °(1 drink is 5 oz. wine, 12 oz. beer, or 1.5 oz. liquor.) ° °Make sure that ANY health care provider who prescribes medication for you knows that you are taking Coumadin (warfarin).  Also make sure the healthcare provider who is monitoring your Coumadin knows when you have started a new medication including herbals and non-prescription products. ° °Coumadin® (Warfarin)  Major Drug Interactions  °Increased Warfarin Effect Decreased Warfarin Effect  °Alcohol (large quantities) °Antibiotics (esp. Septra/Bactrim, Flagyl, Cipro) °Amiodarone (Cordarone) °Aspirin (  ASA) °Cimetidine (Tagamet) °Megestrol (Megace) °NSAIDs (ibuprofen, naproxen, etc.) °Piroxicam (Feldene) °Propafenone (Rythmol SR) °Propranolol (Inderal) °Isoniazid (INH) °Posaconazole (Noxafil) Barbiturates (Phenobarbital) °Carbamazepine (Tegretol) °Chlordiazepoxide (Librium) °Cholestyramine (Questran) °Griseofulvin °Oral Contraceptives °Rifampin °Sucralfate (Carafate) °Vitamin K  ° °Coumadin® (Warfarin) Major Herbal Interactions  °Increased Warfarin Effect Decreased Warfarin Effect    °Garlic °Ginseng °Ginkgo biloba Coenzyme Q10 °Green tea °St. [name]’s wort   ° °Coumadin® (Warfarin) FOOD Interactions  °Eat a consistent number of servings per week of foods HIGH in Vitamin K °(1 serving = ½ cup)  °Collards (cooked, or boiled & drained) °Kale (cooked, or boiled & drained) °Mustard greens (cooked, or boiled & drained) °Parsley *serving size only = ¼ cup °Spinach (cooked, or boiled & drained) °Swiss chard (cooked, or boiled & drained) °Turnip greens (cooked, or boiled & drained)  °Eat a consistent number of servings per week of foods MEDIUM-HIGH in Vitamin K °(1 serving = 1 cup)  °Asparagus (cooked, or boiled & drained) °Broccoli (cooked, boiled & drained, or raw & chopped) °Brussel sprouts (cooked, or boiled & drained) *serving size only = ½ cup °Lettuce, raw (green leaf, endive, romaine) °Spinach, raw °Turnip greens, raw & chopped  ° °These websites have more information on Coumadin (warfarin):  www.coumadin.com; °www.ahrq.gov/consumer/coumadin.htm; ° ° ° °

## 2014-02-01 NOTE — Progress Notes (Signed)
ANTICOAGULATION CONSULT NOTE - Initial Consult  Pharmacy Consult for Coumadin Indication: atrial fibrillation  Allergies  Allergen Reactions  . Lyrica [Pregabalin] Other (See Comments)    Felt loopy  . Zocor [Simvastatin] Other (See Comments)    Myalgias  . Relafen [Nabumetone] Rash    Patient Measurements: Height: 5' 5.5" (166.4 cm) Weight: 204 lb 2.3 oz (92.6 kg) IBW/kg (Calculated) : 58.15 Heparin Dosing Weight:    Vital Signs: Temp: 97.6 F (36.4 C) (08/28 0543) Temp src: Oral (08/28 0543) BP: 101/58 mmHg (08/28 0640) Pulse Rate: 70 (08/28 0640)  Labs:  Recent Labs  01/31/14 1701 01/31/14 1802 02/01/14 0540 02/01/14 0714  HGB 12.4 12.6  --  11.5*  HCT 38.9 37.0  --  35.7*  PLT 136*  --   --  PENDING  APTT  --   --   --  38*  LABPROT 28.0*  --  28.7* 26.4*  INR 2.62*  --  2.70* 2.43*  CREATININE  --  1.50* 1.11* 1.10  TROPONINI  --   --   --  <0.30    Estimated Creatinine Clearance: 53.3 ml/min (by C-G formula based on Cr of 1.1).   Medical History: Past Medical History  Diagnosis Date  . CHF (congestive heart failure)   . PAF (paroxysmal atrial fibrillation)   . Hypertension   . Allergic rhinitis   . Hyperlipidemia   . Lumbar stenosis   . Scoliosis     with radiculopathy L2-S1 with prior surgery  . Small bowel obstruction     versus ileus after last bck surgery  . GERD (gastroesophageal reflux disease)   . OSA (obstructive sleep apnea)     "couldn't handle CPAP; use mouth guard some; not all the time" (01/06/2014)  . DJD (degenerative joint disease)   . Spondylosis   . Arthritis     "all over"  . Chronic back pain     Assessment: Weakness, palpitations, SOB,  73yo female was in Afib w/ HR to 140s at primary care office, cards advised pt to go to ED. Admit INR 2.7.  Anticoag: INR 2.43 today. Hgb 11.5.   Cards: CHF, PAF, HTN, HLD. BP 101/58, HR 70 this AM on Toprol XL and diltiazem drip.    Goal of Therapy:  INR 2-3 Monitor platelets by  anticoagulation protocol: Yes   Plan:  Will continue home Coumadin dose of 5mg  Tue/Sat and 2.5mg  MWTFSun and monitor INR for dose adjustments.    Latoya Diskin S. Alford Highland, PharmD, BCPS Clinical Staff Pharmacist Pager [phone]  Eilene Ghazi Stillinger 02/01/2014,8:49 AM

## 2014-02-02 DIAGNOSIS — I10 Essential (primary) hypertension: Secondary | ICD-10-CM

## 2014-02-02 DIAGNOSIS — R269 Unspecified abnormalities of gait and mobility: Secondary | ICD-10-CM

## 2014-02-02 DIAGNOSIS — Z5181 Encounter for therapeutic drug level monitoring: Secondary | ICD-10-CM

## 2014-02-02 DIAGNOSIS — R799 Abnormal finding of blood chemistry, unspecified: Secondary | ICD-10-CM

## 2014-02-02 DIAGNOSIS — E785 Hyperlipidemia, unspecified: Secondary | ICD-10-CM

## 2014-02-02 DIAGNOSIS — G4733 Obstructive sleep apnea (adult) (pediatric): Secondary | ICD-10-CM

## 2014-02-02 DIAGNOSIS — R0789 Other chest pain: Secondary | ICD-10-CM

## 2014-02-02 LAB — PROTIME-INR
INR: 2.44 — ABNORMAL HIGH (ref 0.00–1.49)
Prothrombin Time: 26.5 seconds — ABNORMAL HIGH (ref 11.6–15.2)

## 2014-02-02 MED ORDER — DILTIAZEM HCL 30 MG PO TABS
30.0000 mg | ORAL_TABLET | Freq: Two times a day (BID) | ORAL | Status: DC
  Administered 2014-02-02 – 2014-02-03 (×3): 30 mg via ORAL
  Filled 2014-02-02 (×4): qty 1

## 2014-02-02 NOTE — Progress Notes (Signed)
ANTICOAGULATION CONSULT NOTE - Follow Up  Pharmacy Consult for Coumadin Indication: atrial fibrillation  Allergies  Allergen Reactions  . Lyrica [Pregabalin] Other (See Comments)    Felt loopy  . Zocor [Simvastatin] Other (See Comments)    Myalgias  . Relafen [Nabumetone] Rash    Patient Measurements: Height: 5' 5.5" (166.4 cm) Weight: 204 lb 2.3 oz (92.6 kg) IBW/kg (Calculated) : 58.15  Vital Signs: Temp: 97.6 F (36.4 C) (08/29 0421) Temp src: Oral (08/29 0421) BP: 97/57 mmHg (08/29 1004) Pulse Rate: 78 (08/29 1004)  Labs:  Recent Labs  01/31/14 1701 01/31/14 1802 02/01/14 0540 02/01/14 0714 02/01/14 1246 02/02/14 0408  HGB 12.4 12.6  --  11.5*  --   --   HCT 38.9 37.0  --  35.7*  --   --   PLT 136*  --   --  119*  --   --   APTT  --   --   --  38*  --   --   LABPROT 28.0*  --  28.7* 26.4*  --  26.5*  INR 2.62*  --  2.70* 2.43*  --  2.44*  CREATININE  --  1.50* 1.11* 1.10  --   --   TROPONINI  --   --   --  <0.30 <0.30  --     Estimated Creatinine Clearance: 53.3 ml/min (by C-G formula based on Cr of 1.1).   Medical History: Past Medical History  Diagnosis Date  . CHF (congestive heart failure)   . PAF (paroxysmal atrial fibrillation)   . Hypertension   . Allergic rhinitis   . Hyperlipidemia   . Lumbar stenosis   . Scoliosis     with radiculopathy L2-S1 with prior surgery  . Small bowel obstruction     versus ileus after last bck surgery  . GERD (gastroesophageal reflux disease)   . OSA (obstructive sleep apnea)     "couldn't handle CPAP; use mouth guard some; not all the time" (01/06/2014)  . DJD (degenerative joint disease)   . Spondylosis   . Arthritis     "all over"  . Chronic back pain     Assessment:   73 yo F admitted 01/31/2014 with Weakness, palpitations, SOB noted to be in afib with RVR.  Pharmacy consulted to dose warfarin.  Coag/heme: Afib, warfarin PTA 2.5 mg daily except 5 mg Tues/Sat; : INR at goal, CBC stable, no bleeding  noted  Cards: CHF, PAF, HTN, HLD. Toprol, dilt drip change to PO HR 70-80s, SBP 100-90s   Neuro: multiple back surgeries, lumbar stenosis, spondylosis mirapex and gabapentin  Goal of Therapy:  INR 2-3 Monitor platelets by anticoagulation protocol: Yes   Plan:  Will continue home Coumadin dose of 5mg  Tue/Sat and 2.5mg  MWTFSun and monitor INR for dose adjustments.   Thank you for allowing pharmacy to be a part of this patients care team.  Rowe Robert Pharm.D., BCPS, AQ-Cardiology Clinical Pharmacist 02/02/2014 2:43 PM Pager: [phone] Phone: [phone]

## 2014-02-02 NOTE — Progress Notes (Signed)
SUBJECTIVE: Pt feeling well and denying chest tightness, shortness of breath, leg swelling and palpitations. Has been urinating frequently. Nurse informed me that IV infiltrated and pt likely did not receive much of the diltiazem. Pt has complaints of chronic back pain, and uses a cane or walker to ambulate.     Intake/Output Summary (Last 24 hours) at 02/02/14 1028 Last data filed at 02/02/14 0816  Gross per 24 hour  Intake    740 ml  Output      0 ml  Net    740 ml    Current Facility-Administered Medications  Medication Dose Route Frequency Provider Last Rate Last Dose  . acetaminophen (TYLENOL) tablet 650 mg  650 mg Oral Q4H PRN Sueanne Margarita, MD   650 mg at 02/01/14 0547  . diltiazem (CARDIZEM) 100 mg in dextrose 5 % 100 mL (1 mg/mL) infusion  5-15 mg/hr Intravenous Continuous Orpah Greek, MD 5 mL/hr at 01/31/14 2007 5 mg/hr at 01/31/14 2007  . diltiazem (CARDIZEM) 100 mg in dextrose 5 % 100 mL (1 mg/mL) infusion  5-15 mg/hr Intravenous Titrated Sueanne Margarita, MD 5 mL/hr at 02/02/14 0113 5 mg/hr at 02/02/14 0113  . gabapentin (NEURONTIN) capsule 300 mg  300 mg Oral BID Sueanne Margarita, MD   300 mg at 02/02/14 1004  . latanoprost (XALATAN) 0.005 % ophthalmic solution 1 drop  1 drop Both Eyes QHS Sueanne Margarita, MD   1 drop at 02/01/14 2153  . metoprolol succinate (TOPROL-XL) 24 hr tablet 100 mg  100 mg Oral Daily Sueanne Margarita, MD   100 mg at 02/02/14 1004  . ondansetron (ZOFRAN) injection 4 mg  4 mg Intravenous Q6H PRN Sueanne Margarita, MD      . pramipexole (MIRAPEX) tablet 0.5 mg  0.5 mg Oral QHS Sueanne Margarita, MD   0.5 mg at 02/01/14 2152  . warfarin (COUMADIN) tablet 2.5 mg  2.5 mg Oral Once per day on Sun Mon Wed Thu Fri Rogue Bussing, RPH   2.5 mg at 02/01/14 1704  . warfarin (COUMADIN) tablet 5 mg  5 mg Oral Once per day on Tue Sat Rogue Bussing, [facility]      . Warfarin - Pharmacist Dosing Inpatient   Does not apply [address] Rogue Bussing,  [facility]        Filed Vitals:   02/01/14 1418 02/01/14 1936 02/02/14 0421 02/02/14 1004  BP: 115/71 98/61 102/54 97/57  Pulse: 86 92 85 78  Temp: 98.2 F (36.8 C) 98 F (36.7 C) 97.6 F (36.4 C)   TempSrc: Oral Oral Oral   Resp: 18 18 16    Height:      Weight:      SpO2: 93% 93% 93%     PHYSICAL EXAM General: NAD HEENT: Normal. Neck: No JVD, no thyromegaly.  Lungs: Clear to auscultation bilaterally with normal respiratory effort. CV: Nondisplaced PMI.  Irregular rhythm, normal S1/S2, no S3, no murmur.  No pretibial edema.  Normal pedal pulses.  Abdomen: Soft, nontender, no hepatosplenomegaly, no distention.  Neurologic: Alert and oriented x 3.  Psych: Normal affect. Musculoskeletal: Normal range of motion. No gross deformities. Extremities: No clubbing or cyanosis.   TELEMETRY: Reviewed telemetry pt in atrial fibrillation, HR 80-105 bpm.  LABS: Basic Metabolic Panel:  Recent Labs  02/01/14 0540 02/01/14 0714  NA 145 142  K 4.1 3.9  CL 110 105  CO2 24 23  GLUCOSE 95 86  BUN 21 21  CREATININE 1.11* 1.10  CALCIUM 8.7 8.7   Liver Function Tests:  Recent Labs  02/01/14 0714  AST 17  ALT 23  ALKPHOS 61  BILITOT 0.4  PROT 5.7*  ALBUMIN 3.3*   No results found for this basename: LIPASE, AMYLASE,  in the last 72 hours CBC:  Recent Labs  01/31/14 1701 01/31/14 1802 02/01/14 0714  WBC 4.6  --  4.3  NEUTROABS  --   --  2.8  HGB 12.4 12.6 11.5*  HCT 38.9 37.0 35.7*  MCV 90.3  --  88.4  PLT 136*  --  119*   Cardiac Enzymes:  Recent Labs  02/01/14 0714 02/01/14 1246  TROPONINI <0.30 <0.30   BNP: No components found with this basename: POCBNP,  D-Dimer: No results found for this basename: DDIMER,  in the last 72 hours Hemoglobin A1C: No results found for this basename: HGBA1C,  in the last 72 hours Fasting Lipid Panel: No results found for this basename: CHOL, HDL, LDLCALC, TRIG, CHOLHDL, LDLDIRECT,  in the last 72 hours Thyroid Function  Tests:  Recent Labs  02/01/14 0714  TSH 2.350   Anemia Panel: No results found for this basename: VITAMINB12, FOLATE, FERRITIN, TIBC, IRON, RETICCTPCT,  in the last 72 hours  RADIOLOGY: Dg Chest Port 1 View  01/31/2014   CLINICAL DATA:  Chest discomfort, atrial fibrillation  EXAM: PORTABLE CHEST - 1 VIEW  COMPARISON:  10/08/2011  FINDINGS: Lungs are clear.  No pleural effusion or pneumothorax.  The heart is top-normal in size.  IMPRESSION: No evidence of acute cardiopulmonary disease.   Electronically Signed   By: Julian Hy M.D.   On: 01/31/2014 17:18      ASSESSMENT AND PLAN: 1. Atrial fibrillation: Pt now asymptomatic, but reportedly has chest tightness when she is tachycardic. Has normal LV systolic function and normal LA size. BP low normal. TSH normal. I will add low-dose short-acting diltiazem 30 mg bid for more optimal rate control. Will need to monitor for pauses as she previously had a 3 second asymptomatic pause. Digoxin may be considered in the future if necessary, and if BP becomes too low with diltiazem. Continue warfarin. INR therapeutic. While cardioversion may be considered, one can also adopt a rate control strategy.  2. Chest tightness: Appears only to occur with rapid atrial fibrillation. Currently asymptomatic. An outpatient nuclear MPI study may be considered. Troponins normal.  3. Essential HTN: BP low normal, asymptomatic.  4. Elevated BNP: No evidence of heart failure, and likely secondary to rapid atrial fibrillation with elevated diastolic filling pressures. LV systolic function is normal.   Kate Sable, M.D., F.A.C.C.

## 2014-02-03 ENCOUNTER — Other Ambulatory Visit: Payer: Self-pay | Admitting: Cardiology

## 2014-02-03 DIAGNOSIS — Z7901 Long term (current) use of anticoagulants: Secondary | ICD-10-CM

## 2014-02-03 DIAGNOSIS — R079 Chest pain, unspecified: Secondary | ICD-10-CM

## 2014-02-03 DIAGNOSIS — R609 Edema, unspecified: Secondary | ICD-10-CM

## 2014-02-03 LAB — PROTIME-INR
INR: 2.44 — ABNORMAL HIGH (ref 0.00–1.49)
Prothrombin Time: 26.5 seconds — ABNORMAL HIGH (ref 11.6–15.2)

## 2014-02-03 MED ORDER — DILTIAZEM HCL 30 MG PO TABS: 30.0000 mg | ORAL_TABLET | Freq: Two times a day (BID) | ORAL | Status: DC

## 2014-02-03 NOTE — Progress Notes (Signed)
Patient Profile: 73 y/o female with h/o HTN, CHF and PAF on chronic wafarin therapy, presented from PCP office on 01/31/14 with weakness and palpitations. Found to be in Afib w/ RVR.    Subjective: Feeling better. Palpitations and chest heaviness resolved. Currently asymptomatic.   Objective: Vital signs in last 24 hours: Temp:  [97.8 F (36.6 C)-98.6 F (37 C)] 97.8 F (36.6 C) (08/30 0553) Pulse Rate:  [64-78] 64 (08/30 0553) Resp:  [18-19] 19 (08/30 0553) BP: (97-128)/(57-61) 128/59 mmHg (08/30 0553) SpO2:  [100 %] 100 % (08/30 0553) Weight:  [202 lb 13.2 oz (92 kg)] 202 lb 13.2 oz (92 kg) (08/30 0553) Last BM Date: 02/02/14  Intake/Output from previous day: 08/29 0701 - 08/30 0700 In: 360 [P.O.:360] Out: -  Intake/Output this shift:    Medications Current Facility-Administered Medications  Medication Dose Route Frequency Provider Last Rate Last Dose  . acetaminophen (TYLENOL) tablet 650 mg  650 mg Oral Q4H PRN Sueanne Margarita, MD   650 mg at 02/01/14 0547  . diltiazem (CARDIZEM) tablet 30 mg  30 mg Oral Q12H Herminio Commons, MD   30 mg at 02/02/14 2302  . gabapentin (NEURONTIN) capsule 300 mg  300 mg Oral BID Sueanne Margarita, MD   300 mg at 02/02/14 2301  . latanoprost (XALATAN) 0.005 % ophthalmic solution 1 drop  1 drop Both Eyes QHS Sueanne Margarita, MD   1 drop at 02/02/14 2303  . metoprolol succinate (TOPROL-XL) 24 hr tablet 100 mg  100 mg Oral Daily Sueanne Margarita, MD   100 mg at 02/02/14 1004  . ondansetron (ZOFRAN) injection 4 mg  4 mg Intravenous Q6H PRN Sueanne Margarita, MD      . pramipexole (MIRAPEX) tablet 0.5 mg  0.5 mg Oral QHS Sueanne Margarita, MD   0.5 mg at 02/02/14 2302  . warfarin (COUMADIN) tablet 2.5 mg  2.5 mg Oral Once per day on Sun Mon Wed Thu Fri Rogue Bussing, RPH   2.5 mg at 02/01/14 1704  . warfarin (COUMADIN) tablet 5 mg  5 mg Oral Once per day on Tue Sat Rogue Bussing, [facility]   5 mg at 02/02/14 1756  . Warfarin - Pharmacist Dosing  Inpatient   Does not apply [place], [facility]        PE: General appearance: alert, cooperative and no distress Lungs: clear to auscultation bilaterally Heart: regular rate and rhythm Extremities: no LEE Pulses: 2+ and symmetric Skin: warm and dry Neurologic: Grossly normal  Lab Results:   Recent Labs  01/31/14 1701 01/31/14 1802 02/01/14 0714  WBC 4.6  --  4.3  HGB 12.4 12.6 11.5*  HCT 38.9 37.0 35.7*  PLT 136*  --  119*   BMET  Recent Labs  01/31/14 1802 02/01/14 0540 02/01/14 0714  NA 140 145 142  K 3.9 4.1 3.9  CL 104 110 105  CO2  --  24 23  GLUCOSE 101* 95 86  BUN 27* 21 21  CREATININE 1.50* 1.11* 1.10  CALCIUM  --  8.7 8.7   PT/INR  Recent Labs  02/01/14 0714 02/02/14 0408 02/03/14 0528  LABPROT 26.4* 26.5* 26.5*  INR 2.43* 2.44* 2.44*   Cardiac Panel (last 3 results)  Recent Labs  02/01/14 0714 02/01/14 1246  TROPONINI <0.30 <0.30    Studies/Results: 2D echo 01/12/14 Study Conclusions  - Left ventricle: The cavity size was normal. Wall thickness was normal. Systolic function was normal. The estimated  ejection fraction was in the range of 60% to 65%. Wall motion was normal; there were no regional wall motion abnormalities. - Pulmonary arteries: PA peak pressure: 34 mm Hg (S).   Assessment/Plan  Principal Problem:   Atrial fibrillation with RVR Active Problems:   HYPERLIPIDEMIA   DEGENERATIVE JOINT DISEASE   Obstructive sleep apnea   Edema   Gait instability  1. Atrial Fibrillation: NSR on telemetry. Converted ~0505 today. HR in the 70s.  BP is stable. Continue rate control with metoprolol and Cardizem.    2. Chronic oral anticoagulation: on warfarin. INR is therapeutic at 2.44.  Dispo: ? Possible discharge home today now that patient is back in SR and INR therapeutic. ? Possible NST as an outpatient.      LOS: 3 days    Brittainy M. Rosita Fire, PA-C 02/03/2014 8:02 AM

## 2014-02-03 NOTE — Discharge Summary (Signed)
Physician Discharge Summary  Patient ID: [name] MRN: [id number] DOB/AGE: 06/18/1940 73 y.o.  Primary Cardiologist: [name] Admit date: 01/31/2014 Discharge date: 02/03/2014  Admission Diagnoses: Atrial Fibrillation w/ RVR  Discharge Diagnoses:  Principal Problem:   Atrial fibrillation with RVR Active Problems:   HYPERLIPIDEMIA   DEGENERATIVE JOINT DISEASE   Obstructive sleep apnea   Edema   Gait instability   Chronic anticoagulation -warfarin therapy   Discharged Condition: stable  Hospital Course: The patient is a 73 y/o female, followed by [name], with a history of HTN and PAF on chronic warfarin therapy. She had a heart catheterization in Feb. 2002 that demonstrated normal coronary arteries and normal LV function. A Myoview study done in July 2009 demonstrated no ischemia. An echocardiogram done in July 2012 demonstrated normal systolic function with an EF of 55-60%.  She was sent to the [facility] ER on 01/31/14 after being evaluated at her PCP's office with complaints of weakness, palpations and chest discomfort, described as "chest heaviness". An EKG revealed atrial fibrillation with RVR.  On arrival to the ER, she was started on IV Cardizem for rate control. Her home dose of metoprolol was also continued, as was warfarin. She was admitted to telemetry. Cardiac enzymes were cycled and negative x 3. THS was normal. A 2D echo was obtained, revealing normal LV function with an EF of 60-65%. Wall motion was normal; there were no regional wall motion abnormalities. The left atrium was normal in size.   Rate control was achieved and her symptoms resolved. She was transitioned from IV to PO Cardizem, 30 mg BID. Metoprolol -XL, 100 mg daily, was also continued. BP remained stable. On hospital day 3, she spontaneously converted to NSR with rates in the 60s-70s. She denied any further symptoms. INR was therapeutic at 2.44. On further questioning, it was discovered that her chest  heaviness had been occuring intermittently over the last several weeks. She denied any additional chest pain once rate control was achived. However, OP NST was recommended to r/o potential ischemia. She was last seen and examined by Dr. Bronson Ing who determined she was stable for discharge home. Post-hospital f/u will be arranged with [name] or an APP. OP NST will also be arranged.    Consults: None  Significant Labs/ Diagnostic Studies:  Cardiac Panel (last 3 results)  Recent Labs  02/01/14 0714 02/01/14 1246  TROPONINI <0.30 <0.30    2D echo 02/01/14 Study Conclusions  - Left ventricle: The cavity size was normal. Wall thickness was normal. Systolic function was normal. The estimated ejection fraction was in the range of 60% to 65%. Wall motion was normal; there were no regional wall motion abnormalities. - Pulmonary arteries: PA peak pressure: 34 mm Hg (S).    Treatments: See Hospital Course  Discharge Exam: Blood pressure 128/59, pulse 64, temperature 97.8 F (36.6 C), temperature source Oral, resp. rate 19, height 5' 5.5" (1.664 m), weight 202 lb 13.2 oz (92 kg), SpO2 100.00%.   Disposition: 01-Home or Self Care      Discharge Instructions   Diet - low sodium heart healthy    Complete by:  As directed      Increase activity slowly    Complete by:  As directed             Medication List    STOP taking these medications       triamterene-hydrochlorothiazide 37.5-25 MG per tablet  Commonly known as:  RSWNIOE-70  TAKE these medications       acetaminophen 500 MG tablet  Commonly known as:  TYLENOL  Take 1,000 mg by mouth daily as needed (pain).     bimatoprost 0.01 % Soln  Commonly known as:  LUMIGAN  Place 1 drop into both eyes at bedtime.     calcium carbonate 750 MG chewable tablet  Commonly known as:  TUMS EX  Chew 2 tablets by mouth at bedtime.     Cinnamon 500 MG capsule  Take 1,000 mg by mouth at bedtime.     diltiazem 30 MG  tablet  Commonly known as:  CARDIZEM  Take 1 tablet (30 mg total) by mouth every 12 (twelve) hours.     EPIPEN 2-PAK 0.3 mg/0.3 mL Soaj injection  Generic drug:  EPINEPHrine  Inject 0.3 mg into the muscle as needed (allergic reaction). Inject into the thigh     fexofenadine 180 MG tablet  Commonly known as:  ALLEGRA  Take 180 mg by mouth daily. For allergies     furosemide 20 MG tablet  Commonly known as:  LASIX  Take 20 mg by mouth daily as needed for fluid or edema (swollen ankles). As needed     gabapentin 300 MG capsule  Commonly known as:  NEURONTIN  Take 300 mg by mouth 2 (two) times daily.     KRILL OIL PO  Take 1 capsule by mouth daily.     Linaclotide 145 MCG Caps capsule  Commonly known as:  LINZESS  Take 145 mcg by mouth daily as needed (constipation).     Magnesium 400 MG Tabs  Take 400 mg by mouth at bedtime.     metoprolol succinate 100 MG 24 hr tablet  Commonly known as:  TOPROL-XL  Take 100 mg by mouth daily. Take with or immediately following a meal.     pramipexole 0.5 MG tablet  Commonly known as:  MIRAPEX  Take 0.5 mg by mouth at bedtime. For restless legs     pravastatin 40 MG tablet  Commonly known as:  PRAVACHOL  Take 40 mg by mouth at bedtime.     PRESCRIPTION MEDICATION  Inject 1 application into the skin once a week. On Tuesdays (allergy shot)     REFRESH OP  Place 1 drop into both eyes 2 (two) times daily.     warfarin 5 MG tablet  Commonly known as:  COUMADIN  Take 2.5-5 mg by mouth daily. Take 1 tablet (5 mg) on Tuesday and Saturday, take 1/2 tablet (2.5 mg) on Sunday, Monday, Wednesday, Thursday, Friday       Follow-up Information   Follow up with Jenkins Rouge, MD. (our office will call you with a follow-up appointment as well as an appointment for a cardiac stress test)    Specialty:  Cardiology   Contact information:   [address] [address] [phone]     TIME SPENT ON DISCHARGE, INCLUDING  PHYSICIAN TIME: >30 MINUTES  Signed: Lyda Jester 02/03/2014, 9:46 AM

## 2014-02-03 NOTE — Progress Notes (Signed)
Pt converted to NSR at 0505. Verified with central tele of time. Will continue to monitor closely

## 2014-02-03 NOTE — Progress Notes (Signed)
The patient was seen and examined, and I agree with the assessment and plan as documented above, with modifications as noted below. Pt doing well, currently in sinus rhythm and asymptomatic. Will plan to continue metoprolol and diltiazem, along with warfarin. Will obtain outpatient stress test for history of intermittent chest pressure over last few months. Dispo: Discharge to home today.

## 2014-02-04 ENCOUNTER — Telehealth: Payer: Self-pay | Admitting: Cardiovascular Disease

## 2014-02-04 NOTE — Telephone Encounter (Signed)
Pt advised discharge states to discontinue Triamterene/HCT.

## 2014-02-04 NOTE — Telephone Encounter (Signed)
New message     Pt was in the hosp last week.  Looking at her summary---a medication was left off the list--triamterene/hctz.  Should pt continue to take this medication?

## 2014-02-07 ENCOUNTER — Telehealth: Payer: Self-pay | Admitting: Cardiovascular Disease

## 2014-02-07 NOTE — Telephone Encounter (Signed)
New message     Pt was in hosp last week for AFIB.  She need a presc for diltiazem [facility]---24mo supply.  Please call if any problems

## 2014-02-08 ENCOUNTER — Other Ambulatory Visit: Payer: Self-pay

## 2014-02-08 MED ORDER — DILTIAZEM HCL 30 MG PO TABS
30.0000 mg | ORAL_TABLET | Freq: Two times a day (BID) | ORAL | Status: DC
Start: 2014-02-08 — End: 2014-02-15

## 2014-02-12 NOTE — Telephone Encounter (Signed)
Follow Up   Pt called states that she was prescribed diltiazem. Pt reports the side effects indicates the medication may cause swelling and the pt says there is swelling in her feet and legs.. requests a call back for an alternative medication.

## 2014-02-12 NOTE — Telephone Encounter (Signed)
Should try the cardizem it converted her in hospital and already on beta blocker  Swelling not a big issue with it compared to something like norvascy

## 2014-02-12 NOTE — Telephone Encounter (Signed)
WILL FORWARD  TO DR NISHAN  FOR  RECOMMENDATIONS./CY 

## 2014-02-15 ENCOUNTER — Telehealth: Payer: Self-pay | Admitting: Cardiovascular Disease

## 2014-02-15 ENCOUNTER — Ambulatory Visit (HOSPITAL_COMMUNITY): Payer: Medicare Other | Attending: Cardiology | Admitting: Radiology

## 2014-02-15 ENCOUNTER — Ambulatory Visit (INDEPENDENT_AMBULATORY_CARE_PROVIDER_SITE_OTHER): Payer: Medicare Other

## 2014-02-15 VITALS — BP 128/68 | HR 55 | Ht 65.5 in | Wt 198.0 lb

## 2014-02-15 DIAGNOSIS — I2510 Atherosclerotic heart disease of native coronary artery without angina pectoris: Secondary | ICD-10-CM | POA: Insufficient documentation

## 2014-02-15 DIAGNOSIS — I4891 Unspecified atrial fibrillation: Secondary | ICD-10-CM

## 2014-02-15 DIAGNOSIS — R0602 Shortness of breath: Secondary | ICD-10-CM | POA: Insufficient documentation

## 2014-02-15 DIAGNOSIS — R079 Chest pain, unspecified: Secondary | ICD-10-CM

## 2014-02-15 DIAGNOSIS — R002 Palpitations: Secondary | ICD-10-CM | POA: Diagnosis present

## 2014-02-15 DIAGNOSIS — Z5181 Encounter for therapeutic drug level monitoring: Secondary | ICD-10-CM

## 2014-02-15 LAB — POCT INR: INR: 3.3

## 2014-02-15 MED ORDER — TECHNETIUM TC 99M SESTAMIBI GENERIC - CARDIOLITE
11.0000 | Freq: Once | INTRAVENOUS | Status: AC | PRN
  Administered 2014-02-15: 11 via INTRAVENOUS

## 2014-02-15 MED ORDER — DILTIAZEM HCL 30 MG PO TABS
30.0000 mg | ORAL_TABLET | Freq: Two times a day (BID) | ORAL | Status: DC
Start: 2014-02-15 — End: 2014-03-01

## 2014-02-15 MED ORDER — REGADENOSON 0.4 MG/5ML IV SOLN
0.4000 mg | Freq: Once | INTRAVENOUS | Status: AC
  Administered 2014-02-15: 0.4 mg via INTRAVENOUS

## 2014-02-15 MED ORDER — TECHNETIUM TC 99M SESTAMIBI GENERIC - CARDIOLITE
33.0000 | Freq: Once | INTRAVENOUS | Status: AC | PRN
  Administered 2014-02-15: 33 via INTRAVENOUS

## 2014-02-15 NOTE — Telephone Encounter (Signed)
Per phone note dated 9/3 pt should continue Cardizem. I spoke with pt and gave her this information. Will send 90 day supply [facility]

## 2014-02-15 NOTE — Telephone Encounter (Signed)
Pt aware. See phone note dated 9/11. Prescription sent in.

## 2014-02-15 NOTE — Progress Notes (Signed)
[facility] [address] [place], [place] [address] [phone]    Cardiology Nuclear Med Study  [name] is a 73 y.o. female     MRN : [id number]     DOB: 1940/08/12  Procedure Date: 02/15/2014  Nuclear Med Background Indication for Stress Test:  Evaluation for Ischemia, Follow-up CAD, and Patient seen in hospital on 01-31-2014 for AFIB/RVR, Enzymes negative History:  CAD, MIP 2009 (normal), Afib Cardiac Risk Factors: Hypertension  Symptoms:  Chest Pain, Palpitations and SOB   Nuclear Pre-Procedure Caffeine/Decaff Intake:  None> 12 hrs NPO After: 7:30pm   Lungs:  clear O2 Sat: 98% on room air. IV 0.9% NS with Angio Cath:  24g  IV Site: R Hand, tolerated well IV Started by:  Irven Baltimore, RN  Chest Size (in):  44 Cup Size: B  Height: 5' 5.5" (1.664 m)  Weight:  198 lb (89.812 kg)  BMI:  Body mass index is 32.44 kg/(m^2). Tech Comments:  Patient took Toprol this am. Irven Baltimore, RN.    Nuclear Med Study 1 or 2 day study: 1 day  Stress Test Type:  Lexiscan  Reading MD: N/A  Order Authorizing Provider:  Fransico Him, MD, and Verlin Dike  Resting Radionuclide: Technetium 4m Sestamibi  Resting Radionuclide Dose: 11.0 mCi   Stress Radionuclide:  Technetium 47m Sestamibi  Stress Radionuclide Dose: 33.0 mCi           Stress Protocol Rest HR: 55 Stress HR: 81  Rest BP: 128/68 Stress BP: 121/65  Exercise Time (min): n/a METS: n/a           Dose of Adenosine (mg):  n/a Dose of Lexiscan: 0.4 mg  Dose of Atropine (mg): n/a Dose of Dobutamine: n/a mcg/kg/min (at max HR)  Stress Test Technologist: Glade Lloyd, BS-ES  Nuclear Technologist:  Annye Rusk, CNMT     Rest Procedure:  Myocardial perfusion imaging was performed at rest 45 minutes following the intravenous administration of Technetium 32m Sestamibi. Rest ECG: NSR with septal infarct  Stress Procedure:  The patient received IV Lexiscan 0.4 mg over 15-seconds.   Technetium 41m Sestamibi injected at 30-seconds.  Quantitative spect images were obtained after a 45 minute delay.  During the infusion of Lexiscan the patient complained of chest heaviness that resolved in recovery.  Stress ECG: No significant change from baseline ECG  QPS Raw Data Images:  Mild breast attenuation.  Normal left ventricular size. Stress Images:  Normal homogeneous uptake in all areas of the myocardium. Rest Images:  Normal homogeneous uptake in all areas of the myocardium. Subtraction (SDS):  No evidence of ischemia. Transient Ischemic Dilatation (Normal <1.22):  1.03 Lung/Heart Ratio (Normal <0.45):  0.35  Quantitative Gated Spect Images QGS EDV:  71 ml QGS ESV:  20 ml  Impression Exercise Capacity:  Lexiscan with no exercise. BP Response:  Normal blood pressure response. Clinical Symptoms:  Typical chest pain. ECG Impression:  No significant ST segment change suggestive of ischemia. Comparison with Prior Nuclear Study: No images to compare  Overall Impression:  Normal stress nuclear study.  LV Ejection Fraction: 72%.  LV Wall Motion:  NL LV Function; NL Wall Motion  Signed: Fransico Him, MD [place]

## 2014-02-15 NOTE — Telephone Encounter (Signed)
New message      Pt is on diltiazem.  It makes her legs swell and muscles weak.  [name] presc the medication while pt was in the hosp.  If Dr Johnsie Cancel want her to stay on it, please send refill to [facility] order pharmacy [facility] for a 90day supply.

## 2014-02-19 ENCOUNTER — Ambulatory Visit (INDEPENDENT_AMBULATORY_CARE_PROVIDER_SITE_OTHER): Payer: Medicare Other

## 2014-02-19 DIAGNOSIS — J309 Allergic rhinitis, unspecified: Secondary | ICD-10-CM

## 2014-03-01 ENCOUNTER — Ambulatory Visit (INDEPENDENT_AMBULATORY_CARE_PROVIDER_SITE_OTHER): Payer: Medicare Other | Admitting: Cardiology

## 2014-03-01 ENCOUNTER — Ambulatory Visit (INDEPENDENT_AMBULATORY_CARE_PROVIDER_SITE_OTHER): Payer: Medicare Other | Admitting: *Deleted

## 2014-03-01 ENCOUNTER — Encounter: Payer: Self-pay | Admitting: Cardiology

## 2014-03-01 VITALS — BP 108/70 | HR 72 | Ht 64.0 in | Wt 199.0 lb

## 2014-03-01 DIAGNOSIS — Z7901 Long term (current) use of anticoagulants: Secondary | ICD-10-CM

## 2014-03-01 DIAGNOSIS — Z5181 Encounter for therapeutic drug level monitoring: Secondary | ICD-10-CM

## 2014-03-01 DIAGNOSIS — M1990 Unspecified osteoarthritis, unspecified site: Secondary | ICD-10-CM

## 2014-03-01 DIAGNOSIS — I4891 Unspecified atrial fibrillation: Secondary | ICD-10-CM

## 2014-03-01 LAB — POCT INR: INR: 3.4

## 2014-03-01 NOTE — Patient Instructions (Signed)
Your physician has recommended you make the following change in your medication: 1. Ok to Stop Diltiazem  Your physician recommends that you schedule a follow-up appointment in: 1 Month with Dr Johnsie Cancel

## 2014-03-01 NOTE — Assessment & Plan Note (Signed)
She has chronic back issues. She has had 4 prior back surguries

## 2014-03-01 NOTE — Assessment & Plan Note (Signed)
We follow

## 2014-03-01 NOTE — Progress Notes (Signed)
03/01/2014 [name]   Jan 07, 1941  [id number]  Primary Physicia [name] Huh, MD Primary Cardiologist: Dr [name]  HPI:  73 y/o female, followed by Dr. Johnsie [name], with a history of HTN and PAF on chronic warfarin therapy. Her husband is a long time pt of our who had a heart transplant 15 yrs ago. Ms [name] had a heart catheterization in Feb. 2002 that demonstrated normal coronary arteries and normal LV function. A Myoview study done in July 2009 demonstrated no ischemia. An echocardiogram done in July 2012 demonstrated normal systolic function with an EF of 55-60%.          She was sent to the [facility] ER on 01/31/14 after being evaluated at her PCP's office with complaints of weakness, palpations. An EKG revealed atrial fibrillation with RVR. On arrival to the ER, she was started on IV Cardizem for rate control. Her home dose of metoprolol was also continued, as was warfarin. A 2D echo was obtained, revealing normal LV function with an EF of 60-65%. Wall motion was normal; there were no regional wall motion abnormalities. The left atrium was normal in size. She converted to NSR and was discharged on low dose po Diltiazem and her previous dose of Metoprolol.            Since discharge she, and her husband, say she has had increasing fatigue, exercise intolerance, and LE edema. They attribute this to the Diltiazem.                 Current Outpatient Prescriptions  Medication Sig Dispense Refill  . acetaminophen (TYLENOL) 500 MG tablet Take 1,000 mg by mouth daily as needed (pain).      . bimatoprost (LUMIGAN) 0.01 % SOLN Place 1 drop into both eyes at bedtime.      . calcium carbonate (TUMS EX) 750 MG chewable tablet Chew 2 tablets by mouth at bedtime.      . Cinnamon 500 MG capsule Take 1,000 mg by mouth at bedtime.       Marland Kitchen EPINEPHrine (EPIPEN 2-PAK) 0.3 mg/0.3 mL IJ SOAJ injection Inject 0.3 mg into the muscle as needed (allergic reaction). Inject into the thigh      . fexofenadine  (ALLEGRA) 180 MG tablet Take 180 mg by mouth daily. For allergies      . furosemide (LASIX) 20 MG tablet Take 20 mg by mouth daily as needed for fluid or edema (swollen ankles). As needed      . gabapentin (NEURONTIN) 300 MG capsule Take 300 mg by mouth 2 (two) times daily.      Marland Kitchen KRILL OIL PO Take 1 capsule by mouth daily.       . Linaclotide (LINZESS) 145 MCG CAPS capsule Take 145 mcg by mouth daily as needed (constipation).      . Magnesium 400 MG TABS Take 400 mg by mouth at bedtime.      . metoprolol succinate (TOPROL-XL) 100 MG 24 hr tablet Take 100 mg by mouth daily. Take with or immediately following a meal.      . Polyvinyl Alcohol-Povidone (REFRESH OP) Place 1 drop into both eyes 2 (two) times daily.      . pramipexole (MIRAPEX) 0.5 MG tablet Take 0.5 mg by mouth at bedtime. For restless legs      . pravastatin (PRAVACHOL) 40 MG tablet Take 40 mg by mouth at bedtime.       Marland Kitchen PRESCRIPTION MEDICATION Inject 1 application into the skin once a week.  On Tuesdays (allergy shot)      . triamterene-hydrochlorothiazide (MAXZIDE-25) 37.5-25 MG per tablet       . warfarin (COUMADIN) 5 MG tablet Take 2.5-5 mg by mouth daily. Take 1 tablet (5 mg) on Tuesday and Saturday, take 1/2 tablet (2.5 mg) on Sunday, Monday, Wednesday, Thursday, Friday       No current facility-administered medications for this visit.    Allergies  Allergen Reactions  . Lyrica [Pregabalin] Other (See Comments)    Felt loopy  . Zocor [Simvastatin] Other (See Comments)    Myalgias  . Relafen [Nabumetone] Rash    History   Social History  . Marital Status: Married    Spouse Name: [name]    Number of Children: 2  . Years of Education: 12th    Occupational History  . retired     Radio producer work   Social History Main Topics  . Smoking status: Never Smoker   . Smokeless tobacco: Never Used  . Alcohol Use: No     Comment: 02/01/2014 " glasseof wine once in a blue moon; < once/year"  . Drug Use: No  . Sexual  Activity: No     Comment: hysterectomy   Other Topics Concern  . Not on file   Social History Narrative   Patient lives at home with husband [name].   Patient is retired.   Patient has a high school education.    Patient has 2 children      Review of Systems: General: negative for chills, fever, night sweats or weight changes.  Cardiovascular: negative for chest pain, dyspnea on exertion, edema, orthopnea, palpitations, paroxysmal nocturnal dyspnea or shortness of breath Dermatological: negative for rash Respiratory: negative for cough or wheezing Urologic: negative for hematuria Abdominal: negative for nausea, vomiting, diarrhea, bright red blood per rectum, melena, or hematemesis Neurologic: negative for visual changes, syncope, or dizziness She has chronic back issues and sees Dr Hal Neer. She has had 4 previous back surgeries and get periodic injections. She now uses a walker. All other systems reviewed and are otherwise negative except as noted above.    Blood pressure 108/70, pulse 72, height 5\' 4"  (1.626 m), weight 199 lb (90.266 kg).  General appearance: alert, cooperative and no distress Lungs: clear to auscultation bilaterally and kyphosis Heart: regular rate and rhythm Extremities: 1+ bilat edema   ASSESSMENT AND PLAN:   Atrial fibrillation with RVR Recent admission for PAF, NSR at discharge with addition of low dose Diltiazem.  Chronic anticoagulation -warfarin therapy We follow  DEGENERATIVE JOINT DISEASE She has chronic back issues. She has had 4 prior back surguries   PLAN  She is in NSR on exam. I explained that I'm not convinced that the low dose of Diltiazem is causing her decline but they seem convinced. I suggested she try stopping the Diltiazem to see if she has any improvement. I asked her to f/u with Dr [name] in a month.   [name] KPA-C 03/01/2014 11:48 AM

## 2014-03-01 NOTE — Assessment & Plan Note (Signed)
Recent admission for PAF, NSR at discharge with addition of low dose Diltiazem.

## 2014-03-07 ENCOUNTER — Other Ambulatory Visit: Payer: Self-pay | Admitting: *Deleted

## 2014-03-08 MED ORDER — WARFARIN SODIUM 5 MG PO TABS: ORAL_TABLET | ORAL | Status: DC

## 2014-03-15 ENCOUNTER — Ambulatory Visit (INDEPENDENT_AMBULATORY_CARE_PROVIDER_SITE_OTHER): Payer: Medicare Other | Admitting: Pharmacist

## 2014-03-15 DIAGNOSIS — Z5181 Encounter for therapeutic drug level monitoring: Secondary | ICD-10-CM

## 2014-03-15 DIAGNOSIS — I4891 Unspecified atrial fibrillation: Secondary | ICD-10-CM

## 2014-03-15 LAB — POCT INR: INR: 3.3

## 2014-03-19 ENCOUNTER — Telehealth: Payer: Self-pay | Admitting: *Deleted

## 2014-03-19 NOTE — Telephone Encounter (Signed)
Pt nuero doctor wants her to have another injection in her back and she wants to make sure this is okay. Per patient [facility] will send over a fax on her also plan on doing it 04/02/14 and need to stop coumadin 7 days ahead of time

## 2014-03-25 NOTE — Telephone Encounter (Signed)
Follow up    Patient calling regarding previous message - wants to know should she go back on her dilitazem  30 mg .

## 2014-03-28 NOTE — Telephone Encounter (Signed)
F/u ° ° °Pt calling about previous message °

## 2014-03-29 NOTE — Telephone Encounter (Signed)
WILL FORWARD TO DR Johnsie Cancel  FOR  REVIEW  RE HOLDING  COUMADIN ./CY

## 2014-04-01 NOTE — Telephone Encounter (Signed)
Ok to stop cardizem and stay on beta blocker  Ok to stop coumadin without bridge for procedure

## 2014-04-02 NOTE — Telephone Encounter (Signed)
PT  NOTIFIED ./CY 

## 2014-04-03 ENCOUNTER — Encounter: Payer: Self-pay | Admitting: Cardiovascular Disease

## 2014-04-03 ENCOUNTER — Ambulatory Visit (INDEPENDENT_AMBULATORY_CARE_PROVIDER_SITE_OTHER): Payer: Medicare Other | Admitting: Cardiovascular Disease

## 2014-04-03 VITALS — BP 110/70 | HR 79 | Ht 65.0 in | Wt 197.0 lb

## 2014-04-03 DIAGNOSIS — R609 Edema, unspecified: Secondary | ICD-10-CM

## 2014-04-03 DIAGNOSIS — R2681 Unsteadiness on feet: Secondary | ICD-10-CM

## 2014-04-03 DIAGNOSIS — E785 Hyperlipidemia, unspecified: Secondary | ICD-10-CM

## 2014-04-03 DIAGNOSIS — Z7901 Long term (current) use of anticoagulants: Secondary | ICD-10-CM

## 2014-04-03 DIAGNOSIS — I480 Paroxysmal atrial fibrillation: Secondary | ICD-10-CM

## 2014-04-03 MED ORDER — FLECAINIDE ACETATE 50 MG PO TABS: 50.0000 mg | ORAL_TABLET | Freq: Two times a day (BID) | ORAL | Status: DC

## 2014-04-03 NOTE — Progress Notes (Signed)
Patient ID: [name], female   DOB: 12/04/1940, 73 y.o.   MRN: [id number] 73 y/o female with a history of HTN and PAF on chronic warfarin therapy. Her husband is a long time pt of our who had a heart transplant 15 yrs ago. [name] had a heart catheterization in Feb. 2002 that demonstrated normal coronary arteries and normal LV function. A Myoview study done in July 2009 demonstrated no ischemia. An echocardiogram done in July 2012 demonstrated normal systolic function with an EF of 55-60%.  She was sent to the [facility] ER on 01/31/14 after being evaluated at her PCP's office with complaints of weakness, palpations. An EKG revealed atrial fibrillation with RVR. On arrival to the ER, she was started on IV Cardizem for rate control. Her home dose of metoprolol was also continued, as was warfarin. A 2D echo was obtained, revealing normal LV function with an EF of 60-65%. Wall motion was normal; there were no regional wall motion abnormalities. The left atrium was normal in size. She converted to NSR and was discharged on low dose po Diltiazem and her previous dose of Metoprolol.   Since discharge she, and her husband, say she has had increasing fatigue, exercise intolerance, and LE edema. They attribute this to the Diltiazem. Stopped by PA a month ago Thinks she feels better off this  Started on more diuretics by primary and K supplement.  Concerned about going into afib without cardizem     ROS: Denies fever, malais, weight loss, blurry vision, decreased visual acuity, cough, sputum, SOB, hemoptysis, pleuritic pain, palpitaitons, heartburn, abdominal pain, melena, lower extremity edema, claudication, or rash.  All other systems reviewed and negative  General: Affect appropriate Chronically ill female  HEENT: normal Neck supple with no adenopathy JVP normal no bruits no thyromegaly Lungs clear with no wheezing and good diaphragmatic motion Heart:  S1/S2 no murmur, no rub, gallop or click PMI  normal Abdomen: benighn, BS positve, no tenderness, no AAA no bruit.  No HSM or HJR Distal pulses intact with no bruits Plus one bilateral  edema Neuro non-focal Skin warm and dry No muscular weakness   Current Outpatient Prescriptions  Medication Sig Dispense Refill  . acetaminophen (TYLENOL) 500 MG tablet Take 1,000 mg by mouth daily as needed (pain).      . bimatoprost (LUMIGAN) 0.01 % SOLN Place 1 drop into both eyes at bedtime.      . calcium carbonate (TUMS EX) 750 MG chewable tablet Chew 2 tablets by mouth at bedtime.      . Cinnamon 500 MG capsule Take 1,000 mg by mouth at bedtime.       Marland Kitchen EPINEPHrine (EPIPEN 2-PAK) 0.3 mg/0.3 mL IJ SOAJ injection Inject 0.3 mg into the muscle as needed (allergic reaction). Inject into the thigh      . fexofenadine (ALLEGRA) 180 MG tablet Take 180 mg by mouth daily. For allergies      . furosemide (LASIX) 20 MG tablet Take 20 mg by mouth daily as needed for fluid or edema (swollen ankles). As needed      . gabapentin (NEURONTIN) 300 MG capsule Take 300 mg by mouth 2 (two) times daily.      Marland Kitchen KRILL OIL PO Take 1 capsule by mouth daily.       . Linaclotide (LINZESS) 145 MCG CAPS capsule Take 145 mcg by mouth daily as needed (constipation).      . Magnesium 400 MG TABS Take 400 mg by mouth at bedtime.      Marland Kitchen  metoprolol succinate (TOPROL-XL) 100 MG 24 hr tablet Take 100 mg by mouth daily. Take with or immediately following a meal.      . Polyvinyl Alcohol-Povidone (REFRESH OP) Place 1 drop into both eyes 2 (two) times daily.      . pramipexole (MIRAPEX) 0.5 MG tablet Take 0.5 mg by mouth at bedtime. For restless legs      . pravastatin (PRAVACHOL) 40 MG tablet Take 40 mg by mouth at bedtime.       Marland Kitchen PRESCRIPTION MEDICATION Inject 1 application into the skin once a week. On Tuesdays (allergy shot)      . triamterene-hydrochlorothiazide (MAXZIDE-25) 37.5-25 MG per tablet       . warfarin (COUMADIN) 5 MG tablet Take as directed by coumadin clinic  60  tablet  0   No current facility-administered medications for this visit.    Allergies  Lyrica; Zocor; and Relafen  Electrocardiogram:  afib rate 123 nonspecific ST changes poor R wave progression   Assessment and Plan

## 2014-04-03 NOTE — Assessment & Plan Note (Signed)
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

## 2014-04-03 NOTE — Assessment & Plan Note (Signed)
Dependant EF normal by echo  Continue current diuretic regimen  Has f/u BMET with primary in 2 weeks

## 2014-04-03 NOTE — Patient Instructions (Signed)
Your physician recommends that you schedule a follow-up appointment in: NEXT  AVAILABLE  WITH DR [facility]  Your physician has recommended you make the following change in your medication:  START  FLECAINIDE   50 MG TWICE  DAILY

## 2014-04-03 NOTE — Assessment & Plan Note (Signed)
Continue anticoagulation and beta blocker  Add flecainide 50 bid  With normal EF and no history of CAD

## 2014-04-03 NOTE — Assessment & Plan Note (Signed)
INR Rx  No bleeding issues will have have to watch gait and fall risk

## 2014-04-03 NOTE — Assessment & Plan Note (Signed)
Cholesterol is at goal.  Continue current dose of statin and diet Rx.  No myalgias or side effects.  F/U  LFT's in 6 months. Lab Results  Component Value Date   [facility]  Value: 101        Total Cholesterol/HDL:CHD Risk Coronary Heart Disease Risk Table                     Men   Women  1/2 Average Risk   3.4   3.3* 12/22/2006

## 2014-04-08 ENCOUNTER — Ambulatory Visit (INDEPENDENT_AMBULATORY_CARE_PROVIDER_SITE_OTHER): Payer: Medicare Other

## 2014-04-08 DIAGNOSIS — Z5181 Encounter for therapeutic drug level monitoring: Secondary | ICD-10-CM

## 2014-04-08 DIAGNOSIS — I4891 Unspecified atrial fibrillation: Secondary | ICD-10-CM

## 2014-04-08 LAB — POCT INR: INR: 1.3

## 2014-04-18 ENCOUNTER — Ambulatory Visit (INDEPENDENT_AMBULATORY_CARE_PROVIDER_SITE_OTHER): Payer: Medicare Other | Admitting: Internal Medicine

## 2014-04-18 ENCOUNTER — Encounter: Payer: Self-pay | Admitting: Internal Medicine

## 2014-04-18 ENCOUNTER — Ambulatory Visit (INDEPENDENT_AMBULATORY_CARE_PROVIDER_SITE_OTHER): Payer: Medicare Other | Admitting: Pharmacist

## 2014-04-18 VITALS — BP 136/84 | HR 84 | Ht 65.0 in | Wt 198.8 lb

## 2014-04-18 DIAGNOSIS — Z5181 Encounter for therapeutic drug level monitoring: Secondary | ICD-10-CM

## 2014-04-18 DIAGNOSIS — I4891 Unspecified atrial fibrillation: Secondary | ICD-10-CM

## 2014-04-18 DIAGNOSIS — J302 Other seasonal allergic rhinitis: Secondary | ICD-10-CM

## 2014-04-18 LAB — POCT INR: INR: 2

## 2014-04-18 NOTE — Progress Notes (Signed)
Patient ID: [name], female    DOB: 06/13/1940, 73 y.o.   MRN: [id number]  HPI 39 yoF followed for allergic rhinitis, hx sleep apnea, complicated by AtrialFib.  3 months ago had lumbar spine surgery with rods and screws, complicated by ileitis. Denies respiratory complications.  Mild sleep apnea AHI 6.3- never tolerated CPAP. Confined more indoors with less exposure to Spring pollen. Taking her allergy shots, GO 1:10. Occasional allegra works ok. To see Dr Johnsie Cancel next week for f/u since feet have been swelling. Noting vasomotor rhinorhea before meals. She had glaucoma before cataract surgery so we chose not to start ipratropium.   04/16/11- 15 yoF followed for allergic rhinitis, hx sleep apnea, complicated by AtrialFib. Since last here had chicken pox and an episode of PAFib with spontaneous reversion to NSR this Fall. Sister [name] newly discovered adenoCA lung. Has had flu vax. Now feels well.  Had failed CPAP in the past- could not sleep with it. Now denies sleep concerns. Continues allergy vaccine at 1:10 and feels this is doing well.   04/17/12-  71 yoF followed for allergic rhinitis, hx sleep apnea, complicated by AtrialFib  Pt states that allergies have been doing very well. She states that she has just gotten over a cold recently.  Allergy vax 1:10  GO administered weekly.  Pt states doing well with sleep-husband keeps telling her that she snores. Back surgery  Oct 12, 2011--no effect on sleep.  She states that she has just gotten over a cold recently. . She is interested in learning about oral appliances for sleep apnea.- Discussed treatment options and sleep hygiene. NPSG 01/25/08- AHI 6/ hr.   04/17/13- 6 yoF followed for allergic rhinitis, hx sleep apnea/ oral appliance, complicated by AtrialFib  Pt states that allergies have been doing very well. She states that she has just gotten over a cold recently.  Allergy vax 1:10  GO administered weekly. (Sister of [name]) Doing well with allergy vaccine. We discussed EpiPen. No problem with reactions. Got an oral appliance for sleep apnea from Dr. Ron Parker.. Husband tells her she does not snore when she wears it.  04/18/14- 73 yoF followed for allergic rhinitis, hx sleep apnea/ oral appliance, complicated by AtrialFib FOLLOW FOR:  Allergies; wearing mouth guard at night for sleep, does not use CPAP; has a lot of dark sinus drainage in throat in the mornings Allergy vax 1:10  GO administered weekly. (Sister of [name])    Review of Systems- see HPI Constitutional:   No-   weight loss, night sweats, fevers, chills, fatigue, lassitude. HEENT:   No-  headaches, difficulty swallowing, tooth/dental problems, sore throat,       No-  sneezing, itching, ear ache, nasal congestion, post nasal drip,  CV:  No-   chest pain, orthopnea, PND, swelling in lower extremities, anasarca, dizziness, palpitations Resp: No-   shortness of breath with exertion or at rest.              No-   productive cough,  No non-productive cough,  No- coughing up of blood.              No-   change in color of mucus.  No- wheezing.   Skin: No-   rash or lesions. GI:  No-   heartburn, indigestion, abdominal pain, nausea, vomiting,  GU:  MS:  No-   joint pain or swelling.   Neuro-     nothing unusual Psych:  No- change in  mood or affect. No depression or anxiety.  No memory loss.  Objective:   Physical Exam General- Alert, Oriented, Affect-appropriate, Distress- none acute.  Skin- rash-none, lesions- none, excoriation- none Lymphadenopathy- none Head- atraumatic            Eyes- Gross vision intact, PERRLA, conjunctivae clear secretions            Ears- Hearing, canals-normal            Nose- Clear, no-Septal dev, mucus, polyps, erosion, perforation             Throat- Mallampati II-III , mucosa clear , drainage- none, tonsils- atrophic Neck- flexible , trachea midline, no stridor , thyroid nl, carotid no bruit Chest -  symmetrical excursion , unlabored           Heart/CV- RRR/ no pacer , no murmur , no gallop  , no rub, nl s1 s2                           - JVD- none , edema- none, stasis changes- none, varices- none           Lung- clear to P&A, wheeze- none, cough- none , dullness-none, rub- none           Chest wall-  Abd-  Br/ Gen/ Rectal- Not done, not indicated Extrem- cyanosis- none, clubbing, none, atrophy- none, strength- nl. Cane. Neuro- grossly intact to observation           Is

## 2014-04-18 NOTE — Patient Instructions (Signed)
We can continue allergy vaccine 1:10 GO  Please call if we can help

## 2014-04-19 ENCOUNTER — Encounter: Payer: Self-pay | Admitting: Internal Medicine

## 2014-04-22 ENCOUNTER — Other Ambulatory Visit: Payer: Self-pay | Admitting: *Deleted

## 2014-04-22 MED ORDER — METOPROLOL SUCCINATE ER 100 MG PO TB24: 100.0000 mg | ORAL_TABLET | Freq: Every day | ORAL | Status: DC

## 2014-04-25 ENCOUNTER — Other Ambulatory Visit: Payer: Self-pay | Admitting: *Deleted

## 2014-04-25 MED ORDER — METOPROLOL SUCCINATE ER 100 MG PO TB24: 100.0000 mg | ORAL_TABLET | Freq: Every day | ORAL | Status: DC

## 2014-04-29 ENCOUNTER — Telehealth: Payer: Self-pay | Admitting: Cardiovascular Disease

## 2014-04-29 NOTE — Telephone Encounter (Signed)
She initially thought cardizem was a problem so we stopped this  Now she thinks flecainide is issue High risk of recurrent afib if we stop it.  I think the issue is medical and she was supposed to have w/u For parkinsons  Would not stop flecainide

## 2014-04-29 NOTE — Telephone Encounter (Signed)
New problem   Pt is having side affect from medication she was put on by Dr Johnsie Cancel. Please call pt.

## 2014-04-29 NOTE — Telephone Encounter (Signed)
PER PT  SINCE MIDDLE OF LAST WEEK   STARTED  FEELING  BAD  COMPLAINING  OF    MUSCLE  WEAKNESS  CAN BARELY  WALK  ACROSS ROOM  WITH  WALKER   AND  DIZZINESS   THINKS   MAY BE COMING  FROM FLECAINIDE  WHICH  WAS  STARTED  END  OF  OCT  WILL FORWARD  MESSAGE TO DR Johnsie Cancel FOR  REVIEW .Adonis Housekeeper

## 2014-04-30 ENCOUNTER — Ambulatory Visit (INDEPENDENT_AMBULATORY_CARE_PROVIDER_SITE_OTHER): Payer: Medicare Other

## 2014-04-30 DIAGNOSIS — I4891 Unspecified atrial fibrillation: Secondary | ICD-10-CM

## 2014-04-30 DIAGNOSIS — Z5181 Encounter for therapeutic drug level monitoring: Secondary | ICD-10-CM

## 2014-04-30 LAB — POCT INR: INR: 2.3

## 2014-04-30 NOTE — Telephone Encounter (Signed)
PT  AWARE LAST OFFICE NOTE  FAXED  T O  DR Marisue Humble .Adonis Housekeeper

## 2014-05-15 ENCOUNTER — Encounter: Payer: Self-pay | Admitting: Neurology

## 2014-05-15 ENCOUNTER — Ambulatory Visit (INDEPENDENT_AMBULATORY_CARE_PROVIDER_SITE_OTHER): Payer: Medicare Other | Admitting: Neurology

## 2014-05-15 VITALS — BP 120/72 | HR 65 | Ht 66.0 in | Wt 197.0 lb

## 2014-05-15 DIAGNOSIS — G20C Parkinsonism, unspecified: Secondary | ICD-10-CM | POA: Insufficient documentation

## 2014-05-15 DIAGNOSIS — G20 Parkinson's disease: Secondary | ICD-10-CM

## 2014-05-15 MED ORDER — CARBIDOPA-LEVODOPA 25-100 MG PO TABS: 1.0000 | ORAL_TABLET | Freq: Three times a day (TID) | ORAL | Status: DC

## 2014-05-15 NOTE — Progress Notes (Signed)
[facility]    Provider:  Dr [name] Referring Provider: [name], MD Primary Care Physician:  Simona Huh, MD  CC:  Walking slowly  HPI:  [name] is a 73 y.o. female here as a follow up. She was a previous patient of Dr. [name] and is transitioning care. She is getting weaker. She is on a new medication for afib. Problems walking, she is walking very slow, she has not fallen, can't turn, she freezes, she has difficulty getting out of a chair. She can't get her feet to move. She has pain in the feet ,in the toes. She has shuffling, doesn't pick up her feet. Has constipation. Smell is ok. No tremor. Has stiffness. She has some vision changes. Has dizziness upon standing. No FHx of neurodegenerative disorders. Memory is good overall. Takes gabapentin for the pain in her feet but she can't tolerate a higher dose. Her voice is lower, her handwriting is smaller. No tremor. Symptoms started over a year ago and are progressively getting worse. 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 She has not fallen. She eats well. No history of dopamine-blocking agents.   11/16/2013 Dr. [name]: [name] is a 73 y.o. female here as a follow up with new onset dizziness. States having these episodes off and on for the past 3 months. Fluctuates in severity but has been constant. Notes feeling light headed, no sensation of vertigo or movement. Notes feeling off balance, unsteady on her feet. Uses a walker, needs a walker or wall to support herself. States these episodes came on acutely. Episodes are worse with standing but can occur sitting. Not precipitated or made worse by movement or head turning. Notes some increased muscle weakness.  Continues to have lower back pain, is followed by neurosurgery, has received steroid injections in her hips.   EMG/NCS results: Nerve conduction studies done on both lower extremities shows no clear evidence of a  generalized peripheral neuropathy. 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 On the right lower extremity, EMG evaluation reveals evidence of a mild primarily chronic L5 and S1 radiculopathy. No other significant abnormalities were seen.   Reviewed notes, labs and imaging from outside physicians, which showed: TSH WNL.  Personally reviewed MRI of the brain.Some atrophy and non-specific white-matter changes.  Review of Systems: Patient complains of symptoms per HPI as well as the following symptoms: activity change, light sensitivity, blurred vision, flushing, facial swelling, runny nose, drooling, leg swelling, restless leg, apnea, daytime sleepiness, joint pain, joint swelling, back pain, walking difficulty, dizziness, speech difficulty, weakness. Pertinent negatives per HPI. All others negative.   History   Social History  . Marital Status: Married    Spouse Name: [name]    Number of Children: 2  . Years of Education: 12th    Occupational History  . retired     Radio producer work   Social History Main Topics  . Smoking status: Never Smoker   . Smokeless tobacco: Never Used  . Alcohol Use: No     Comment: 02/01/2014 " glasseof wine once in a blue moon; < once/year"  . Drug Use: No  . Sexual  Activity: No     Comment: hysterectomy   Other Topics Concern  . Not on file   Social History Narrative   Patient lives at home with husband [name].   Patient is retired.   Patient has a high school education.    Patient has 2 children     Family History  Problem Relation Age of Onset  . Arthritis Mother   . Heart attack Father   .  Hypertension Sister   . Asthma Sister   . Allergies Sister     Past Medical History  Diagnosis Date  . CHF (congestive heart failure)   . PAF (paroxysmal atrial fibrillation)   . Hypertension   . Allergic rhinitis   . Hyperlipidemia   . Lumbar stenosis   . Scoliosis     with radiculopathy L2-S1 with prior surgery  . Small bowel obstruction     versus ileus after last bck surgery  . GERD (gastroesophageal reflux disease)   . OSA (obstructive sleep apnea)     "couldn't handle CPAP; use mouth guard some; not all the time" (01/06/2014)  . DJD (degenerative joint disease)   . Spondylosis   . Arthritis     "all over"  . Chronic back pain     Past Surgical History  Procedure Laterality Date  . Nasal septum surgery  80's  . Total abdominal hysterectomy  02/1993  . Lipoma excision  1980's    "fatty tumors"  . Lumbar disc surgery  02/2009    "ruptured disc"  . Repair dural / csf leak  02/2009  . Cataract extraction w/ intraocular lens  implant, bilateral  2011  . Posterior lumbar fusion  06/2010; 10/2011    "placed screws, rods, spacers both times"  . Back surgery    . Colonoscopy  12/09/2004    Current Outpatient Prescriptions  Medication Sig Dispense Refill  . acetaminophen (TYLENOL) 500 MG tablet Take 1,000 mg by mouth daily as needed (pain).    . bimatoprost (LUMIGAN) 0.01 % SOLN Place 1 drop into both eyes at bedtime.    . calcium carbonate (TUMS EX) 750 MG chewable tablet Chew 2 tablets by mouth at bedtime.    . Cinnamon 500 MG capsule Take 1,000 mg by mouth at bedtime.     Marland Kitchen EPINEPHrine (EPIPEN 2-PAK) 0.3 mg/0.3 mL IJ SOAJ injection Inject 0.3 mg into the muscle as needed (allergic reaction). Inject into the thigh    . fexofenadine (ALLEGRA) 180 MG tablet Take 180 mg by mouth daily. For allergies    . flecainide (TAMBOCOR) 50 MG tablet Take 1 tablet (50 mg total) by mouth 2 (two) times daily. 60 tablet 3  . furosemide (LASIX) 20 MG tablet Take 20 mg by mouth daily.     Marland Kitchen  gabapentin (NEURONTIN) 300 MG capsule Take 300 mg by mouth 2 (two) times daily.    Marland Kitchen HYDROcodone-acetaminophen (NORCO/VICODIN) 5-325 MG per tablet As needed for pain    . KRILL OIL PO Take 1 capsule by mouth daily.     . Linaclotide (LINZESS) 145 MCG CAPS capsule Take 145 mcg by mouth daily as needed (constipation).    . Magnesium 400 MG TABS Take 400 mg by mouth at bedtime.    . metolazone (ZAROXOLYN) 5 MG tablet 1 tab every other day    . metoprolol succinate (TOPROL-XL) 100 MG 24 hr tablet Take 1 tablet (100 mg total) by mouth daily. Take with or immediately following a meal. 90 tablet 1  .  Polyvinyl Alcohol-Povidone (REFRESH OP) Place 1 drop into both eyes 2 (two) times daily.    . potassium chloride SA (K-DUR,KLOR-CON) 20 MEQ tablet 1 tab every  day    . pramipexole (MIRAPEX) 0.5 MG tablet Take 0.5 mg by mouth at bedtime. For restless legs    . pravastatin (PRAVACHOL) 40 MG tablet Take 40 mg by mouth at bedtime.     Marland Kitchen PRESCRIPTION MEDICATION Inject 1 application into the skin once a week. On Tuesdays (allergy shot)    . warfarin (COUMADIN) 5 MG tablet Take as directed by coumadin clinic 60 tablet 0   No current facility-administered medications for this visit.    Allergies as of 05/15/2014 - Review Complete 05/15/2014  Allergen Reaction Noted  . Lyrica [pregabalin] Other (See Comments) 01/31/2014  . Zocor [simvastatin] Other (See Comments) 04/17/2012  . Relafen [nabumetone] Rash 04/17/2012    Vitals: BP   Ht 5\' 6"  (1.676 m)  Wt 197 lb (89.359 kg)  BMI 31.81 kg/m2 Last Weight:  Wt Readings from Last 1 Encounters:  05/15/14 197 lb (89.359 kg)   Last Height:   Ht Readings from Last 1 Encounters:  05/15/14 5\' 6"  (1.676 m)   Physical exam: Exam: Gen: NAD, conversant, well nourised, obese, well groomed                     CV: RRR, no MRG. No Carotid Bruits.  Eyes: Conjunctivae clear without exudates or hemorrhage  Neuro: Detailed Neurologic Exam  Speech:Mild hypophonia.  No aphasia or dysarthria.  Cognition:    The patient is oriented to person, place, and time;     recent and remote memory intact;     language fluent;     normal attention, concentration,     fund of knowledge Cranial Nerves:    The pupils are equal, round, and reactive to light. The fundi are flat on the right, cannot visualize the left. . Visual fields are full to finger confrontation. Extraocular movements are intact. Trigeminal sensation is intact and the muscles of mastication are normal. The face is symmetric. The palate elevates in the midline. Voice is normal. Shoulder shrug is normal. The tongue has normal motion without fasciculations.   Coordination:    finger to nose intact but slow, cannot perform HTS due to body habitus. Good finger tapping.   Gait:    Shuffling, slow. Unable to assess arm swing, she uses a cane. Stooped.    Motor Observation: Masked Facies    no involuntary movements noted. Tone:   Mild  Increased tone uppers with facilitation  Posture:    stooped    Strength:   Left DF 3+/5, otherwise Strength is V/V in the upper and lower limbs.      Sensation:     Intact to LT Reflex Exam:  DTR's:    Absent lowers, brisk uppers.  Toes:    The toes are equivocal bilaterally.   Clonus:    Clonus is absent.  Assessment/Plan:  73 year old female here as a follow up. She was a former patient of Dr. [name]. Exam shows shuffling gait with stooped posture, masked facies, hypophonia and hypographia, possible cogwheeling in the upper extremities, masked facies. No tremor. No history of Dopa blocking drugs. Parkinsonian features, will try Sinemet and see if patient responds.   1) Carbidopa/Levodopa(Sinemet) 25/100mg . Start with a 1/2 tab 3 times daily (8am, noon, 4pm for example). Try to separate Sinemet from food (especially protein-rich foods like meat, dairy,  eggs) by about 30-60 mins - this will help the absorption of the medication. If you have some nausea with  the medication, you can take it with some light food like crackers or ginger ale. Discussed side effects especially watching for dizziness and hypotension. Start with 1/2 tab 3 times a day and in a few weeks, if tolerating, increase slowly over a week to one full tab 3x a day. 2) There is an excellent Parkinson's support group in [place]. It is called "[facility]" and meets once a month. Please call Amy Marriot or Vianne Bulls at [phone] for more information. There is no charge/fee for joining the group.  3) Physical Therapy for gait and safety. Discussed OT as well, they will get back to me (she has difficulty dressing, putting on shoes and socks, transferring) 4) Follow up in 4-6 weeks   Sarina Ill, MD  [facility] [address] [place], [place] [address]  Phone [phone] Fax [phone]

## 2014-05-15 NOTE — Patient Instructions (Addendum)
Overall you are doing fairly well but I do want to suggest a few things today:   Remember to drink plenty of fluid, eat healthy meals and do not skip any meals. Try to eat protein with a every meal and eat a healthy snack such as fruit or nuts in between meals. Try to keep a regular sleep-wake schedule and try to exercise daily, particularly in the form of walking, 20-30 minutes a day, if you can.   As far as your medications are concerned, I would like to suggest 1) Carbidopa/Levodopa(Sinemet) 25/100mg . Start with a 1/2 tab 3 times daily (8am, noon, 4pm for example). Try to separate Sinemet from food (especially protein-rich foods like meat, dairy, eggs) by about 30-60 mins - this will help the absorption of the medication. If you have some nausea with the medication, you can take it with some light food like crackers or ginger ale 2) There is an excellent Parkinson's support group in [place]. It is called "[facility] [employer]" and meets once a month. Please call Amy Marriot or Vianne Bulls at [phone] for more information. There is no charge/fee for joining the group.  3) Physical Therapy  I would like to see you back in 4-6 weeks, sooner if we need to. Please call us with any interim questions, concerns, problems, updates or refill requests.   Please also call us for any test results so we can go over those with you on the phone.  My clinical assistant and will answer any of your questions and relay your messages to me and also relay most of my messages to you.   Our phone number is [phone]. We also have an after hours call service for urgent matters and there is a physician on-call for urgent questions. For any emergencies you know to call 911 or go to the nearest emergency room

## 2014-05-16 ENCOUNTER — Telehealth: Payer: Self-pay | Admitting: Pharmacist Clinician (PhC)/ Clinical Pharmacy Specialist

## 2014-05-16 NOTE — Telephone Encounter (Signed)
Pt called today, stating has new medication, Sinemet 25/100 mg.  Will start at 1/2 tab tid x 2 weeks, then increase to 1 tab tid.  Wanted to be sure will not interact with warfarin.  Assured patient this was not a problem, thanked her for calling

## 2014-05-20 NOTE — Progress Notes (Signed)
Patient ID: [name], female   DOB: 15-Feb-1941, 73 y.o.   MRN: [id number] 73 y/o female with a history of HTN and PAF on chronic warfarin therapy. Her husband is a long time pt of our who had a heart transplant 15 yrs ago. Ms Ramseyer had a heart catheterization in Feb. 2002 that demonstrated normal coronary arteries and normal LV function. A Myoview study done in July 2009 demonstrated no ischemia. An echocardiogram done in July 2012 demonstrated normal systolic function with an EF of 55-60%.  She was sent to the [facility] ER on 01/31/14 after being evaluated at her PCP's office with complaints of weakness, palpations. An EKG revealed atrial fibrillation with RVR. On arrival to the ER, she was started on IV Cardizem for rate control. Her home dose of metoprolol was also continued, as was warfarin. A 2D echo was obtained, revealing normal LV function with an EF of 60-65%. Wall motion was normal; there were no regional wall motion abnormalities. The left atrium was normal in size. She converted to NSR and was discharged on low dose po Diltiazem and her previous dose of Metoprolol.   Since discharge she, and her husband, say she has had increasing fatigue, exercise intolerance, and LE edema. They attribute this to the Diltiazem. Stopped by PA a month ago Thinks she feels better off this Started on more diuretics by primary and K supplement. Concerned about going into afib without cardizem  Myovue 9/15 normal EF no ischemia  Flecainide added 04/03/14  Seen by Dr [name] and started on cardiodopa 2 weeks ago   ROS: Denies fever, malais, weight loss, blurry vision, decreased visual acuity, cough, sputum, SOB, hemoptysis, pleuritic pain, palpitaitons, heartburn, abdominal pain, melena, lower extremity edema, claudication, or rash.  All other systems reviewed and negative  General: Masked facies  Healthy:  appears stated age HEENT: normal Neck supple with no adenopathy JVP normal no bruits no  thyromegaly Lungs clear with no wheezing and good diaphragmatic motion Heart:  S1/S2 no murmur, no rub, gallop or click PMI normal Abdomen: benighn, BS positve, no tenderness, no AAA no bruit.  No HSM or HJR Distal pulses intact with no bruits Plus one bilateral edema Neuro non-focal rigid with Parkinsons  Skin warm and dry No muscular weakness   Current Outpatient Prescriptions  Medication Sig Dispense Refill  . acetaminophen (TYLENOL) 500 MG tablet Take 1,000 mg by mouth daily as needed (pain).    . bimatoprost (LUMIGAN) 0.01 % SOLN Place 1 drop into both eyes at bedtime.    . calcium carbonate (TUMS EX) 750 MG chewable tablet Chew 2 tablets by mouth at bedtime.    . carbidopa-levodopa (SINEMET IR) 25-100 MG per tablet Take 1 tablet by mouth 3 (three) times daily. Start with 1/2 tab three times dail 90 tablet 6  . Cinnamon 500 MG capsule Take 1,000 mg by mouth at bedtime.     Marland Kitchen EPINEPHrine (EPIPEN 2-PAK) 0.3 mg/0.3 mL IJ SOAJ injection Inject 0.3 mg into the muscle as needed (allergic reaction). Inject into the thigh    . fexofenadine (ALLEGRA) 180 MG tablet Take 180 mg by mouth daily. For allergies    . flecainide (TAMBOCOR) 50 MG tablet Take 1 tablet (50 mg total) by mouth 2 (two) times daily. 60 tablet 3  . furosemide (LASIX) 20 MG tablet Take 20 mg by mouth daily.     Marland Kitchen gabapentin (NEURONTIN) 300 MG capsule Take 300 mg by mouth 2 (two) times daily.    Marland Kitchen  HYDROcodone-acetaminophen (NORCO/VICODIN) 5-325 MG per tablet As needed for pain    . KRILL OIL PO Take 1 capsule by mouth daily.     . Linaclotide (LINZESS) 145 MCG CAPS capsule Take 145 mcg by mouth daily as needed (constipation).    . Magnesium 400 MG TABS Take 400 mg by mouth at bedtime.    . metolazone (ZAROXOLYN) 5 MG tablet 1 tab every other day    . metoprolol succinate (TOPROL-XL) 100 MG 24 hr tablet Take 1 tablet (100 mg total) by mouth daily. Take with or immediately following a meal. 90 tablet 1  . Polyvinyl  Alcohol-Povidone (REFRESH OP) Place 1 drop into both eyes 2 (two) times daily.    . potassium chloride SA (K-DUR,KLOR-CON) 20 MEQ tablet 1 tab every  day    . pramipexole (MIRAPEX) 0.5 MG tablet Take 0.5 mg by mouth at bedtime. For restless legs    . pravastatin (PRAVACHOL) 40 MG tablet Take 40 mg by mouth at bedtime.     Marland Kitchen PRESCRIPTION MEDICATION Inject 1 application into the skin once a week. On Tuesdays (allergy shot)    . warfarin (COUMADIN) 5 MG tablet Take as directed by coumadin clinic 60 tablet 0   No current facility-administered medications for this visit.    Allergies  Lyrica; Zocor; and Relafen  Electrocardiogram: 02/04/14  afib rate 123 poor R wave progression nonspecific ST/T wave changes   Assessment and Plan

## 2014-05-21 ENCOUNTER — Encounter: Payer: Self-pay | Admitting: Cardiovascular Disease

## 2014-05-21 ENCOUNTER — Ambulatory Visit (INDEPENDENT_AMBULATORY_CARE_PROVIDER_SITE_OTHER): Payer: Medicare Other | Admitting: Surgery

## 2014-05-21 ENCOUNTER — Ambulatory Visit (INDEPENDENT_AMBULATORY_CARE_PROVIDER_SITE_OTHER): Payer: Medicare Other | Admitting: Cardiovascular Disease

## 2014-05-21 VITALS — BP 136/74 | Ht 65.5 in | Wt 200.0 lb

## 2014-05-21 DIAGNOSIS — Z5181 Encounter for therapeutic drug level monitoring: Secondary | ICD-10-CM

## 2014-05-21 DIAGNOSIS — E785 Hyperlipidemia, unspecified: Secondary | ICD-10-CM

## 2014-05-21 DIAGNOSIS — G20 Parkinson's disease: Secondary | ICD-10-CM

## 2014-05-21 DIAGNOSIS — I480 Paroxysmal atrial fibrillation: Secondary | ICD-10-CM

## 2014-05-21 DIAGNOSIS — G20C Parkinsonism, unspecified: Secondary | ICD-10-CM

## 2014-05-21 DIAGNOSIS — I4891 Unspecified atrial fibrillation: Secondary | ICD-10-CM

## 2014-05-21 LAB — POCT INR: INR: 3.1

## 2014-05-21 NOTE — Patient Instructions (Signed)
Your physician wants you to follow-up in:  6 MONTHS WITH DR NISHAN  You will receive a reminder letter in the mail two months in advance. If you don't receive a letter, please call our office to schedule the follow-up appointment. Your physician recommends that you continue on your current medications as directed. Please refer to the Current Medication list given to you today. 

## 2014-05-21 NOTE — Assessment & Plan Note (Signed)
Cholesterol is at goal.  Continue current dose of statin and diet Rx.  No myalgias or side effects.  F/U  LFT's in 6 months. Lab Results  Component Value Date   LDLCALC * 12/22/2006    101        Total Cholesterol/HDL:CHD Risk Coronary Heart Disease Risk Table                     Men   Women  1/2 Average Risk   3.4   3.3

## 2014-05-21 NOTE — Assessment & Plan Note (Signed)
Maint NSR on flecaindie improved

## 2014-05-21 NOTE — Assessment & Plan Note (Signed)
On coumadin for afib  INR check today no bleeding issues

## 2014-05-21 NOTE — Assessment & Plan Note (Signed)
F/U neuro hopefully mobility will be better on cardiodopa

## 2014-05-23 ENCOUNTER — Other Ambulatory Visit: Payer: Self-pay | Admitting: *Deleted

## 2014-05-23 MED ORDER — FLECAINIDE ACETATE 50 MG PO TABS: 50.0000 mg | ORAL_TABLET | Freq: Two times a day (BID) | ORAL | Status: DC

## 2014-06-18 ENCOUNTER — Ambulatory Visit: Payer: Medicare Other | Attending: Neurology

## 2014-06-18 ENCOUNTER — Ambulatory Visit (INDEPENDENT_AMBULATORY_CARE_PROVIDER_SITE_OTHER): Payer: Medicare Other | Admitting: *Deleted

## 2014-06-18 DIAGNOSIS — R293 Abnormal posture: Secondary | ICD-10-CM | POA: Insufficient documentation

## 2014-06-18 DIAGNOSIS — Z5181 Encounter for therapeutic drug level monitoring: Secondary | ICD-10-CM

## 2014-06-18 DIAGNOSIS — I480 Paroxysmal atrial fibrillation: Secondary | ICD-10-CM

## 2014-06-18 DIAGNOSIS — R262 Difficulty in walking, not elsewhere classified: Secondary | ICD-10-CM | POA: Diagnosis not present

## 2014-06-18 DIAGNOSIS — R279 Unspecified lack of coordination: Secondary | ICD-10-CM | POA: Insufficient documentation

## 2014-06-18 DIAGNOSIS — I4891 Unspecified atrial fibrillation: Secondary | ICD-10-CM

## 2014-06-18 LAB — POCT INR: INR: 2.2

## 2014-06-18 NOTE — Therapy (Signed)
[facility] [address] [place], [place], [address] Phone: [phone]   Fax:  [phone]  Physical Therapy Evaluation  Patient Details  Name: [name] MRN: [id number] Date of Birth: 1941/01/22 Referring Provider:  Melvenia Beam, MD  Encounter Date: 06/18/2014      PT End of Session - 06/18/14 1638    Visit Number 1   Number of Visits 17   Date for PT Re-Evaluation 08/17/14   Authorization Type Blue Medicare-G code required   PT Start Time 1230   PT Stop Time 1315   PT Time Calculation (min) 45 min      Past Medical History  Diagnosis Date  . CHF (congestive heart failure)   . PAF (paroxysmal atrial fibrillation)   . Hypertension   . Allergic rhinitis   . Hyperlipidemia   . Lumbar stenosis   . Scoliosis     with radiculopathy L2-S1 with prior surgery  . Small bowel obstruction     versus ileus after last bck surgery  . GERD (gastroesophageal reflux disease)   . OSA (obstructive sleep apnea)     "couldn't handle CPAP; use mouth guard some; not all the time" (01/06/2014)  . DJD (degenerative joint disease)   . Spondylosis   . Arthritis     "all over"  . Chronic back pain     Past Surgical History  Procedure Laterality Date  . Nasal septum surgery  80's  . Total abdominal hysterectomy  02/1993  . Lipoma excision  1980's    "fatty tumors"  . Lumbar disc surgery  02/2009    "ruptured disc"  . Repair dural / csf leak  02/2009  . Cataract extraction w/ intraocular lens  implant, bilateral  2011  . Posterior lumbar fusion  06/2010; 10/2011    "placed screws, rods, spacers both times"  . Back surgery    . Colonoscopy  12/09/2004    There were no vitals taken for this visit.  Visit Diagnosis:  Lack of coordination  Difficulty walking  Posture abnormality      Subjective Assessment - 06/18/14 1243    Symptoms Pt reports that she noticed changes in her walking and with freezing episodes  in 2014. Pt saw Dr. [name] a couple weeks ago and reports she just started on Carbidopa-Levidopa and has noticed significant improvement in her ability to ambulate with the walker. She reports she is now able to walk short distances in the house with just a cane or without any device.   Pertinent History History of 4 back surgeries, all lumbar 2 in 2010 and 1 in 2012, and 1 in 2013. (screws, rods and spacers in 2012-2013)   Patient Stated Goals To get rid of the walker and be able to ambulate without it. Pt and husband would like patient to get back to being able to drive.    Currently in Pain? No/denies  at times pt has pain in the buttocks, will monitor but will not directly treat          [facility] PT Assessment - 06/18/14 0001    Assessment   Medical Diagnosis Parkinsonism   Next MD Visit 06/20/14 with Dr. [name]   Prior Therapy only for spinal surgery   Precautions   Precautions Fall  no heavy lifting   Balance Screen   Has the patient fallen in the past 6 months No   Has the patient had a decrease in activity level because of a fear  of falling?  Yes   Is the patient reluctant to leave their home because of a fear of falling?  No   Home Environment   Living Enviornment Private residence   Living Arrangements Spouse/significant other   Available Help at Discharge Family   Type of [place] to enter   Entrance Stairs-Number of Steps [place] One level   [place] - 2 wheels;Cane - quad;Shower seat;Grab bars - tub/shower  elevated commode   Observation/Other Assessments   Focus on Therapeutic Outcomes (FOTO)  --  Not set up on eval. Pt to complete on 2nd visit   Posture/Postural Control   Posture/Postural Control Postural limitations   Postural Limitations Rounded Shoulders;Forward head;Decreased lumbar lordosis   Transfers   Transfers Sit to Stand;Stand to Sit   Sit to Stand Five times sit to stand  18.25  seconds with BUE push up   Ambulation/Gait   Ambulation/Gait Yes   Ambulation/Gait Assistance 4: Min assist   Ambulation/Gait Assistance Details Min A during turn   Ambulation Distance (Feet) 50 Feet   Assistive device Rolling walker   Gait Pattern Decreased dorsiflexion - right;Decreased dorsiflexion - left;Decreased stride length;Poor foot clearance - left   Ambulation Surface Level;Indoor   Gait velocity 1.89 ft/sec with RW  1.87 ft/sec without AD with CGA   Standardized Balance Assessment   Standardized Balance Assessment Timed Up and Go Test   Timed Up and Go Test   TUG Normal TUG;Manual TUG;Cognitive TUG   Normal TUG (seconds) 29.75   Manual TUG (seconds) 45.65   Cognitive TUG (seconds) 42.38   TUG Comments --  >13.5 seconds indicative of high fall risk                          PT Education - 06/18/14 1637    Education provided Yes   Education Details progressive nature of parkinson's, how our clinic prescribes therapy duration/frequency for [employer]) Educated Patient;Spouse   Methods Explanation   Comprehension Verbalized understanding          PT Short Term Goals - 06/18/14 1641    PT SHORT TERM GOAL #1   Title Independent with HEP to address gait, balance, posture impairments Target 07/19/14   PT SHORT TERM GOAL #2   Title Increase gait speed to 2.3 ft/sec for improved efficiency of functional mobility Target 07/19/14   PT SHORT TERM GOAL #3   Title Demonstrate and verbalize increased ease of donning shoes and socks due to increased flexibility. Target 07/19/14   PT SHORT TERM GOAL #4   Title Demonstrate ability to turn 180 degrees while ambulating without an assistive device 5x with supervision and no loss of balance. Target 07/19/14   PT SHORT TERM GOAL #5   Title Complete FGA and write appropriate STG and LTG           PT Long Term Goals - 06/18/14 1644    PT LONG TERM GOAL #1   Title Verbalize 3 strategies to decrease  freezing episodes. Target 08/17/14   PT LONG TERM GOAL #2   Title Increase gait speed to >2.62 ft/sec for community ambulator status. Target 08/17/14   PT LONG TERM GOAL #3   Title Decrease TUG time with our without assistive device to 22 seconds for improved efficiency of functional mobility.  Target 08/17/14   PT LONG TERM GOAL #4  Title Decrease TUG manual time to 38 seconds without freezing episode for improved efficiency. Target 08/17/14   PT LONG TERM GOAL #5   Title Decrease 5x sit to stand to 14 seconds with UE push off for decreased debility. Target 08/17/14               Plan - 06-30-2014 1639    Clinical Impression Statement Pt has impaired gait and balance, flexibility, posture and funcitonal mobility due to parkinsonism. Will benefit from skilled PT services to address these impairments.   Rehab Potential Good   PT Frequency 2x / week   PT Duration 8 weeks   PT Treatment/Interventions ADLs/Self Care Home Management;DME Instruction;Therapeutic activities;Patient/family education;Therapeutic exercise;Balance training;Gait training;Neuromuscular re-education   PT Next Visit Plan Complete FGA and FOTO and write appropriate goals; HEP to include sit to stands, turnining, and PWR standing exercises          G-Codes - 06/30/2014 1650    Functional Assessment Tool Used TUG Manual 45.65 seconds; TUG 29.75 seconds   Functional Limitation Mobility: Walking and moving around   Mobility: Walking and Moving Around Current Status ([id number]) At least 40 percent but less than 60 percent impaired, limited or restricted   Mobility: Walking and Moving Around Goal Status [phone]) At least 20 percent but less than 40 percent impaired, limited or restricted       Problem List Patient Active Problem List   Diagnosis Date Noted  . Parkinsonism 05/15/2014  . Chronic anticoagulation -warfarin therapy 02/03/2014  . Atrial fibrillation with RVR 01/31/2014  . Gait instability 11/16/2013  .  Encounter for therapeutic drug monitoring 07/20/2013  . Edema 03/03/2010  . ATRIAL FIBRILLATION 04/23/2008  . Elevated lipids 01/31/2008  . Obstructive sleep apnea 01/31/2008  . GLAUCOMA 01/30/2008  . Allergic rhinitis, cause unspecified 01/30/2008  . DEGENERATIVE JOINT DISEASE 01/30/2008    Delrae Sawyers D 06/30/14, 4:52 PM  [facility] [address] [place], [place], [address] Phone: [phone]   Fax:  [phone]

## 2014-06-19 ENCOUNTER — Ambulatory Visit (INDEPENDENT_AMBULATORY_CARE_PROVIDER_SITE_OTHER): Payer: Medicare Other

## 2014-06-19 DIAGNOSIS — J309 Allergic rhinitis, unspecified: Secondary | ICD-10-CM

## 2014-06-20 ENCOUNTER — Encounter: Payer: Self-pay | Admitting: Neurology

## 2014-06-20 ENCOUNTER — Ambulatory Visit (INDEPENDENT_AMBULATORY_CARE_PROVIDER_SITE_OTHER): Payer: Medicare Other | Admitting: Neurology

## 2014-06-20 VITALS — BP 142/65 | HR 67 | Ht 66.0 in | Wt 205.6 lb

## 2014-06-20 DIAGNOSIS — G20 Parkinson's disease: Secondary | ICD-10-CM

## 2014-06-20 MED ORDER — CARBIDOPA-LEVODOPA 25-100 MG PO TABS: 1.0000 | ORAL_TABLET | Freq: Three times a day (TID) | ORAL | Status: DC

## 2014-06-20 MED ORDER — CARBIDOPA-LEVODOPA 25-100 MG PO TABS: 1.0000 | ORAL_TABLET | Freq: Four times a day (QID) | ORAL | Status: DC

## 2014-06-20 NOTE — Patient Instructions (Signed)
Overall you are doing fairly well but I do want to suggest a few things today:   Remember to drink plenty of fluid, eat healthy meals and do not skip any meals. Try to eat protein with a every meal and eat a healthy snack such as fruit or nuts in between meals. Try to keep a regular sleep-wake schedule and try to exercise daily, particularly in the form of walking, 20-30 minutes a day, if you can.   As far as your medications are concerned, I would like to suggest 1) Carbidopa/Levodopa(Sinemet) 25/100mg . One tab 4 times daily daily (8am, 11am, 2pm and 5pm for example). May take an extra tab in the evenings if needed. Try to separate Sinemet from food (especially protein-rich foods like meat, dairy, eggs) by about 30-60 mins - this will help the absorption of the medication. If you have some nausea with the medication, you can take it with some light food like crackers or ginger ale 2) There is an excellent Parkinson's support group in [place]. It is called "[employer] [facility] Parkinson's" and meets once a month. Please call Amy Marriot or Vianne Bulls at [phone] for more information. There is no charge/fee for joining the group.  3) Physical Therapy 4) For constipation: try the "prune juice cocktail". Mix 1/2 cup applesauce, 2 tablespoons of miller's bran, 4-6 oz. prune juice. Store in Multimedia programmer. Take a tablespoonful per day at first, gradually increasing until you find the amount that works best. Most people find this mixture tasty.   I would like to see you back in 4-6 weeks, sooner if we need to. Please call us with any interim questions, concerns, problems, updates or refill requests.   Please also call us for any test results so we can go over those with you on the phone.  My clinical assistant and will answer any of your questions and relay your messages to me and also relay most of my messages to you.   Our phone number is [phone]. We also have an after hours call service for urgent  matters and there is a physician on-call for urgent questions. For any emergencies you know to call 911 or go to the nearest emergency room

## 2014-06-20 NOTE — Progress Notes (Signed)
[facility]    Provider:  Dr [name] Referring Provider: [name], MD Primary Care Physician:  Simona Huh, MD  CC:  Parkinson's  HPI:  [name] is a 74 y.o. female here as a referral from Dr. Marisue Humble for Parkinson's disease.  Interval update 06/20/14: She is walking better. No falls. Possibly some mild dizziness when standing from laying position so she has to go slowly. Feels the Sinemet has really helped and she is so happy. Had some questions about what causes PD, how did she get it. Is having considerable constipation.   05/15/2014: [name] is a 74 y.o. female here as a follow up. She was a previous patient of Dr. [name] and is transitioning care. She is getting weaker. She is on a new medication for afib. Problems walking, she is walking very slow, she has not fallen, can't turn, she freezes, she has difficulty getting out of a chair. She can't get her feet to move. She has pain in the feet ,in the toes. She has shuffling, doesn't pick up her feet. Has constipation. Smell is ok. No tremor. Has stiffness. She has some vision changes. Has dizziness upon standing. No FHx of neurodegenerative disorders. Memory is good overall. Takes gabapentin for the pain in her feet but she can't tolerate a higher dose. Her voice is lower, her handwriting is smaller. No tremor. Symptoms started over a year ago and are progressively getting worse. 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 She has not fallen. She eats well. No history of dopamine-blocking agents.   11/16/2013 Dr. [name]: [name] is a 74 y.o. female here as a follow up with new onset dizziness. States having these episodes off and on for the past 3 months. Fluctuates in severity but has been constant. Notes feeling light headed, no sensation of vertigo or movement. Notes feeling off balance, unsteady on her feet. Uses a walker, needs a walker or wall to support  herself. States these episodes came on acutely. Episodes are worse with standing but can occur sitting. Not precipitated or made worse by movement or head turning. Notes some increased muscle weakness. Continues to have lower back pain, is followed by neurosurgery, has received steroid injections in her hips.   EMG/NCS results: Nerve conduction studies done on both lower extremities shows no clear evidence of a generalized peripheral neuropathy. 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 On the right lower extremity, EMG evaluation reveals evidence of a mild primarily chronic L5 and S1 radiculopathy. No other significant abnormalities were seen.   Reviewed notes, labs and imaging from outside physicians, which showed: TSH WNL.  Personally reviewed MRI of the brain.Some atrophy and non-specific white-matter changes.   Review of Systems: Patient complains of symptoms per HPI as well as the following symptoms: runny nose, drooling, leg swelling, constipation, daytime sleepiness, back pain, walking difficulty, frequency of urination, dizziness, back pain, itchingPertinent negatives per HPI. All others negative.   History   Social History  . Marital Status: Married    Spouse Name: [name]    Number of Children: 2  . Years of Education: 12th    Occupational  History  . retired     Radio producer work   Social History Main Topics  . Smoking status: Never Smoker   . Smokeless tobacco: Never Used  . Alcohol Use: No     Comment: 02/01/2014 " glasseof wine once in a blue moon; < once/year"  . Drug Use: No  . Sexual Activity: No     Comment:  hysterectomy   Other Topics Concern  . Not on file   Social History Narrative   Patient lives at home with husband [name].   Patient is retired.   Patient has a high school education.    Patient has 2 children     Family History  Problem Relation Age of Onset  . Arthritis Mother   . Heart attack Father   . Hypertension Sister   . Asthma Sister   . Allergies Sister     Past Medical History  Diagnosis Date  . CHF (congestive heart failure)   . PAF (paroxysmal atrial fibrillation)   . Hypertension   . Allergic rhinitis   . Hyperlipidemia   . Lumbar stenosis   . Scoliosis     with radiculopathy L2-S1 with prior surgery  . Small bowel obstruction     versus ileus after last bck surgery  . GERD (gastroesophageal reflux disease)   . OSA (obstructive sleep apnea)     "couldn't handle CPAP; use mouth guard some; not all the time" (01/06/2014)  . DJD (degenerative joint disease)   . Spondylosis   . Arthritis     "all over"  . Chronic back pain     Past Surgical History  Procedure Laterality Date  . Nasal septum surgery  80's  . Total abdominal hysterectomy  02/1993  . Lipoma excision  1980's    "fatty tumors"  . Lumbar disc surgery  02/2009    "ruptured disc"  . Repair dural / csf leak  02/2009  . Cataract extraction w/ intraocular lens  implant, bilateral  2011  . Posterior lumbar fusion  06/2010; 10/2011    "placed screws, rods, spacers both times"  . Back surgery    . Colonoscopy  12/09/2004    Current Outpatient Prescriptions  Medication Sig Dispense Refill  . acetaminophen (TYLENOL) 500 MG tablet Take 1,000 mg by mouth daily as needed (pain).    . bimatoprost (LUMIGAN) 0.01 % SOLN Place 1 drop into both eyes at bedtime.    . calcium carbonate (TUMS EX) 750 MG chewable tablet Chew 2 tablets by mouth at bedtime.    . carbidopa-levodopa (SINEMET IR) 25-100 MG per tablet Take 1 tablet by mouth 4 (four) times daily. May take an extra pill in the evenings if needed 150  tablet 6  . Cinnamon 500 MG capsule Take 1,000 mg by mouth at bedtime.     Marland Kitchen EPINEPHrine (EPIPEN 2-PAK) 0.3 mg/0.3 mL IJ SOAJ injection Inject 0.3 mg into the muscle as needed (allergic reaction). Inject into the thigh    . fexofenadine (ALLEGRA) 180 MG tablet Take 180 mg by mouth daily. For allergies    . flecainide (TAMBOCOR) 50 MG tablet Take 1 tablet (50 mg total) by mouth 2 (two) times daily. 180 tablet 1  . furosemide (LASIX) 20 MG tablet Take 20 mg by mouth daily.     Marland Kitchen gabapentin (NEURONTIN) 300 MG capsule Take 300 mg by mouth 2 (two) times daily.    Marland Kitchen HYDROcodone-acetaminophen (NORCO/VICODIN) 5-325 MG per tablet As needed for pain    . KRILL  OIL PO Take 1 capsule by mouth daily.     . Linaclotide (LINZESS) 145 MCG CAPS capsule Take 145 mcg by mouth daily as needed (constipation).    . Magnesium 400 MG TABS Take 400 mg by mouth at bedtime.    . metolazone (ZAROXOLYN) 5 MG tablet 1 tab every other day    . metoprolol succinate (TOPROL-XL) 100 MG 24 hr tablet Take 1 tablet (100 mg total) by mouth daily. Take with or immediately following a meal. 90 tablet 1  . Polyvinyl Alcohol-Povidone (REFRESH OP) Place 1 drop into both eyes 2 (two) times daily.    . potassium chloride SA (K-DUR,KLOR-CON) 20 MEQ tablet 1 tab every  day    . pramipexole (MIRAPEX) 0.5 MG tablet Take 0.5 mg by mouth at bedtime. For restless legs    . pravastatin (PRAVACHOL) 40 MG tablet Take 40 mg by mouth at bedtime.     Marland Kitchen PRESCRIPTION MEDICATION Inject 1 application into the skin once a week. On Tuesdays (allergy shot)    . warfarin (COUMADIN) 5 MG tablet Take as directed by coumadin clinic 60 tablet 0   No current facility-administered medications for this visit.    Allergies as of 06/20/2014 - Review Complete 06/20/2014  Allergen Reaction Noted  . Lyrica [pregabalin] Other (See Comments) 01/31/2014  . Zocor [simvastatin] Other (See Comments) 04/17/2012  . Relafen [nabumetone] Rash 04/17/2012    Vitals: BP  142/65 mmHg  Pulse 67  Ht 5\' 6"  (1.676 m)  Wt 205 lb 9.6 oz (93.26 kg)  BMI 33.20 kg/m2 Last Weight:  Wt Readings from Last 1 Encounters:  06/20/14 205 lb 9.6 oz (93.26 kg)   Last Height:   Ht Readings from Last 1 Encounters:  06/20/14 5\' 6"  (1.676 m)    Gait:  Shuffling, slow but improved.   Motor Observation: Masked Facies, significant  no involuntary movements noted. Able to get up out of a chair independently which is much improved.  Tone:   Mild Increased tone uppers with facilitation  Posture:  stooped  Assessment/Plan: 74 year old female here as a follow up. She was a former patient of Dr. [name]. Exam shows shuffling gait with stooped posture, masked facies, hypophonia and hypographia, possible cogwheeling in the upper extremities, masked facies. No tremor. No history of Dopa blocking drugs. Parkinsonian features. Patient responded well to Sinemet. Walking much better and can rise independently from chair. Discussed PD and its etiology, natural progression. Will increase meds, discussed side effects and when to call the office or stop medications.  1) Carbidopa/Levodopa(Sinemet) 25/100mg . Increase to  one tab 4 times daily daily (8am, 11am, 2pm and 5pm for example). May take an extra tab in the evenings if needed. Try to separate Sinemet from food (especially protein-rich foods like meat, dairy, eggs) by about 30-60 mins - this will help the absorption of the medication. If you have some nausea with the medication, you can take it with some light food like crackers or ginger ale  2) There is an excellent Parkinson's support group in [place]. It is called "[employer] Parkinson's" and meets once a month. Please call Amy Marriot or Vianne Bulls at [phone] for more information. There is no charge/fee for joining the group.  3) Physical Therapy for gait and safety, starts soon 4) For constipation: try the "prune juice cocktail". Mix 1/2 cup applesauce, 2 tablespoons  of miller's bran, 4-6 oz. prune juice. Store in Multimedia programmer. Take a tablespoonful per day at first, gradually increasing until  you find the amount that works best. Most people find this mixture tasty. 5) Follow up in 4-6 weeks  Sarina Ill, MD  [facility] [address] [place], [place] [address]  Phone [phone] Fax [phone]  A total of 20 minutes was spent in with this patient. Over half this time was spent on counseling patient on the diagnosis and different therapeutic options available.

## 2014-06-21 ENCOUNTER — Telehealth: Payer: Self-pay | Admitting: *Deleted

## 2014-06-21 NOTE — Telephone Encounter (Signed)
Patient calling to see if medication was sent to [facility], Carbidopa 25-100 mg, will need to go to [facility] instead.

## 2014-06-23 ENCOUNTER — Other Ambulatory Visit: Payer: Self-pay | Admitting: Neurology

## 2014-06-23 DIAGNOSIS — G20 Parkinson's disease: Secondary | ICD-10-CM

## 2014-06-23 MED ORDER — CARBIDOPA-LEVODOPA 25-100 MG PO TABS: 1.0000 | ORAL_TABLET | Freq: Four times a day (QID) | ORAL | Status: DC

## 2014-06-23 NOTE — Telephone Encounter (Signed)
Please let patient know that I reordered her medication and sent it to [facility]. Thanks.

## 2014-06-24 NOTE — Telephone Encounter (Signed)
Patient was aware and have picked meds up from her pharmacy.

## 2014-06-25 ENCOUNTER — Ambulatory Visit: Payer: Medicare Other

## 2014-06-25 DIAGNOSIS — R279 Unspecified lack of coordination: Secondary | ICD-10-CM | POA: Diagnosis not present

## 2014-06-25 DIAGNOSIS — R293 Abnormal posture: Secondary | ICD-10-CM

## 2014-06-25 DIAGNOSIS — R262 Difficulty in walking, not elsewhere classified: Secondary | ICD-10-CM

## 2014-06-25 NOTE — Patient Instructions (Signed)
Basic 4 PWR moves in standing (handout). Hold onto a chair in front of you as needed, and keep a bed or couch behind you in case you need to sit down. Perform 10x each toward left and right for all exercises.   Sit to stands-do 10 of these fast, without using your hands (be sure to lean forward. Perform daily.)  Low Back stretch: Lay on you back, and bring both knees up to your chest and hug them for 30 seconds. Perform 3x in morning and night.  Upper back stretch: Lay on your back and bring both arms as far overhead as you can. Hold 30 seconds and perform 3x moring and night.

## 2014-06-25 NOTE — Therapy (Signed)
[facility] [address] [place], [place], [address] Phone: [phone]   Fax:  [phone]  Physical Therapy Treatment  Patient Details  Name: [name] MRN: [id number] Date of Birth: August 27, 1940 Referring Provider:  [name], MD  Encounter Date: 06/25/2014      PT End of Session - 06/25/14 1552    Visit Number 2   Number of Visits 17   Date for PT Re-Evaluation 08/17/14   Authorization Type Blue Medicare-G code required   PT Start Time 1234   PT Stop Time 1315   PT Time Calculation (min) 41 min      Past Medical History  Diagnosis Date  . CHF (congestive heart failure)   . PAF (paroxysmal atrial fibrillation)   . Hypertension   . Allergic rhinitis   . Hyperlipidemia   . Lumbar stenosis   . Scoliosis     with radiculopathy L2-S1 with prior surgery  . Small bowel obstruction     versus ileus after last bck surgery  . GERD (gastroesophageal reflux disease)   . OSA (obstructive sleep apnea)     "couldn't handle CPAP; use mouth guard some; not all the time" (01/06/2014)  . DJD (degenerative joint disease)   . Spondylosis   . Arthritis     "all over"  . Chronic back pain     Past Surgical History  Procedure Laterality Date  . Nasal septum surgery  80's  . Total abdominal hysterectomy  02/1993  . Lipoma excision  1980's    "fatty tumors"  . Lumbar disc surgery  02/2009    "ruptured disc"  . Repair dural / csf leak  02/2009  . Cataract extraction w/ intraocular lens  implant, bilateral  2011  . Posterior lumbar fusion  06/2010; 10/2011    "placed screws, rods, spacers both times"  . Back surgery    . Colonoscopy  12/09/2004    There were no vitals taken for this visit.  Visit Diagnosis:  Lack of coordination  Difficulty walking  Posture abnormality      Subjective Assessment - 06/25/14 1236    Symptoms Pt reports no falls or complaints          [facility] PT Assessment - 06/25/14  0001    Functional Gait  Assessment   Gait assessed  Yes   Gait Level Surface Walks 20 ft, slow speed, abnormal gait pattern, evidence for imbalance or deviates 10-15 in outside of the 12 in walkway width. Requires more than 7 sec to ambulate 20 ft.   Change in Gait Speed Able to smoothly change walking speed without loss of balance or gait deviation. Deviate no more than 6 in outside of the 12 in walkway width.   Gait with Horizontal Head Turns Performs head turns smoothly with slight change in gait velocity (eg, minor disruption to smooth gait path), deviates 6-10 in outside 12 in walkway width, or uses an assistive device.   Gait with Vertical Head Turns Performs task with slight change in gait velocity (eg, minor disruption to smooth gait path), deviates 6 - 10 in outside 12 in walkway width or uses assistive device   Gait and Pivot Turn Cannot turn safely, requires assistance to turn and stop.   Step Over Obstacle Cannot perform without assistance.   Gait with Narrow Base of Support Ambulates less than 4 steps heel to toe or cannot perform without assistance.   Gait with Eyes Closed Cannot walk 20 ft without  assistance, severe gait deviations or imbalance, deviates greater than 15 in outside 12 in walkway width or will not attempt task.   Ambulating Backwards Walks 20 ft, slow speed, abnormal gait pattern, evidence for imbalance, deviates 10-15 in outside 12 in walkway width.   Steps Alternating feet, must use rail.   Total Score 11     Neuro re-ed: Pt completed FGA Taught and provided and performed HEP. See pt instructions.                       PT Short Term Goals - 06/25/14 1556    PT SHORT TERM GOAL #1   Title Independent with HEP to address gait, balance, posture impairments Target 07/19/14   PT SHORT TERM GOAL #2   Title Increase gait speed to 2.3 ft/sec for improved efficiency of functional mobility Target 07/19/14   PT SHORT TERM GOAL #3   Title Demonstrate and  verbalize increased ease of donning shoes and socks due to increased flexibility. Target 07/19/14   PT SHORT TERM GOAL #4   Title Demonstrate ability to turn 180 degrees while ambulating without an assistive device 5x with supervision and no loss of balance. Target 07/19/14   PT SHORT TERM GOAL #5   Title Increase FGA score to 16/30 for improved balance.           PT Long Term Goals - 06/25/14 1557    PT LONG TERM GOAL #1   Title Verbalize 3 strategies to decrease freezing episodes. Target 08/17/14   PT LONG TERM GOAL #2   Title Increase gait speed to >2.62 ft/sec for community ambulator status. Target 08/17/14   PT LONG TERM GOAL #3   Title Decrease TUG time with our without assistive device to 22 seconds for improved efficiency of functional mobility.  Target 08/17/14   PT LONG TERM GOAL #4   Title Decrease TUG manual time to 38 seconds without freezing episode for improved efficiency. Target 08/17/14   PT LONG TERM GOAL #5   Title Decrease 5x sit to stand to 14 seconds with UE push off for decreased debility. Target 08/17/14   Additional Long Term Goals   Additional Long Term Goals Yes   PT LONG TERM GOAL #6   Title Increase FGA score to 21/30 for improved balance. Target 08/17/14               Plan - 06/25/14 1552    Clinical Impression Statement Pt was taught and provided with part of her HEP today. Expected to continue to progress toward goals. Noted to have difficulty with bed mobility   PT Next Visit Plan Complete FOTO; hand printed HEP to patient (jenny's desk), teach and provide supine PWR exercises        Problem List Patient Active Problem List   Diagnosis Date Noted  . Parkinsonism 05/15/2014  . Chronic anticoagulation -warfarin therapy 02/03/2014  . Atrial fibrillation with RVR 01/31/2014  . Gait instability 11/16/2013  . Encounter for therapeutic drug monitoring 07/20/2013  . Edema 03/03/2010  . ATRIAL FIBRILLATION 04/23/2008  . Elevated lipids 01/31/2008   . Obstructive sleep apnea 01/31/2008  . GLAUCOMA 01/30/2008  . Allergic rhinitis, cause unspecified 01/30/2008  . DEGENERATIVE JOINT DISEASE 01/30/2008    Delrae Sawyers D 06/25/2014, 3:59 PM  [facility] [address] [place], [place], [address] Phone: [phone]   Fax:  [phone]

## 2014-06-28 ENCOUNTER — Ambulatory Visit: Payer: Medicare Other

## 2014-07-01 ENCOUNTER — Ambulatory Visit: Payer: Medicare Other

## 2014-07-01 DIAGNOSIS — R279 Unspecified lack of coordination: Secondary | ICD-10-CM | POA: Diagnosis not present

## 2014-07-01 DIAGNOSIS — R262 Difficulty in walking, not elsewhere classified: Secondary | ICD-10-CM

## 2014-07-01 DIAGNOSIS — R293 Abnormal posture: Secondary | ICD-10-CM

## 2014-07-01 NOTE — Patient Instructions (Signed)
Perform the PWR Low floor supine exercises 10x each, daily. (wait to perform the standing ones until we review them again)  Gluteal Stretch--Perform without ball   Lie supine, one foot flat on bed and other ankle crossed over knee. Hold for 30 seconds..Perform 3x30 seconds daily. Copyright  VHI. All rights reserved.

## 2014-07-01 NOTE — Therapy (Signed)
[facility] [address] [place], [place], [address] Phone: [phone]   Fax:  [phone]  Physical Therapy Treatment  Patient Details  Name: [name] MRN: [id number] Date of Birth: [birth year] Referring Provider:  [name], MD  Encounter Date: 07/01/2014      PT End of Session - 07/01/14 1200    Visit Number 3   Number of Visits 17   Date for PT Re-Evaluation 08/17/14   Authorization Type Blue Medicare-G code required   PT Start Time 4431   PT Stop Time 1150   PT Time Calculation (min) 47 min      Past Medical History  Diagnosis Date  . CHF (congestive heart failure)   . PAF (paroxysmal atrial fibrillation)   . Hypertension   . Allergic rhinitis   . Hyperlipidemia   . Lumbar stenosis   . Scoliosis     with radiculopathy L2-S1 with prior surgery  . Small bowel obstruction     versus ileus after last bck surgery  . GERD (gastroesophageal reflux disease)   . OSA (obstructive sleep apnea)     "couldn't handle CPAP; use mouth guard some; not all the time" (01/06/2014)  . DJD (degenerative joint disease)   . Spondylosis   . Arthritis     "all over"  . Chronic back pain     Past Surgical History  Procedure Laterality Date  . Nasal septum surgery  80's  . Total abdominal hysterectomy  02/1993  . Lipoma excision  1980's    "fatty tumors"  . Lumbar disc surgery  02/2009    "ruptured disc"  . Repair dural / csf leak  02/2009  . Cataract extraction w/ intraocular lens  implant, bilateral  2011  . Posterior lumbar fusion  06/2010; 10/2011    "placed screws, rods, spacers both times"  . Back surgery    . Colonoscopy  12/09/2004    There were no vitals taken for this visit.  Visit Diagnosis:  Lack of coordination  Difficulty walking  Posture abnormality      Subjective Assessment - 07/01/14 1106    Symptoms Pt reports no falls or complaints; but came close to falling last night.,    Currently in Pain? No/denies     Pt noted to have increased swelling of BLE due to fluid retention  Supine with BLE elevated 20x each: ankle pumps, short arc quads, straight leg raises  Gluteus maximus stretch 3x30 seconds in hooklying each leg  Bilateral knee to chest stretch 3x30 seconds with assistance  Lumbar rocking 20x  3x30 seconds upper back stretch (as in previously provided HEP)  10x each LOW Floor supine PWR exercises                       PT Education - 07/01/14 1200    Education provided Yes   Education Details HEP see handout   Person(s) Educated Patient;Spouse   Methods Explanation   Comprehension Verbalized understanding          PT Short Term Goals - 06/25/14 1556    PT SHORT TERM GOAL #1   Title Independent with HEP to address gait, balance, posture impairments Target 07/19/14   PT SHORT TERM GOAL #2   Title Increase gait speed to 2.3 ft/sec for improved efficiency of functional mobility Target 07/19/14   PT SHORT TERM GOAL #3   Title Demonstrate and verbalize increased ease of donning shoes and socks due to  increased flexibility. Target 07/19/14   PT SHORT TERM GOAL #4   Title Demonstrate ability to turn 180 degrees while ambulating without an assistive device 5x with supervision and no loss of balance. Target 07/19/14   PT SHORT TERM GOAL #5   Title Increase FGA score to 16/30 for improved balance.           PT Long Term Goals - 06/25/14 1557    PT LONG TERM GOAL #1   Title Verbalize 3 strategies to decrease freezing episodes. Target 08/17/14   PT LONG TERM GOAL #2   Title Increase gait speed to >2.62 ft/sec for community ambulator status. Target 08/17/14   PT LONG TERM GOAL #3   Title Decrease TUG time with our without assistive device to 22 seconds for improved efficiency of functional mobility.  Target 08/17/14   PT LONG TERM GOAL #4   Title Decrease TUG manual time to 38 seconds without freezing episode for improved efficiency.  Target 08/17/14   PT LONG TERM GOAL #5   Title Decrease 5x sit to stand to 14 seconds with UE push off for decreased debility. Target 08/17/14   Additional Long Term Goals   Additional Long Term Goals Yes   PT LONG TERM GOAL #6   Title Increase FGA score to 21/30 for improved balance. Target 08/17/14               Plan - 07/01/14 1201    Clinical Impression Statement Pt continues to have difficulty with bed mobilty and reports her PWR standing exercises are very challenging. She was instructed to hold off on the standing PWR exercises until after we review then next session.   PT Next Visit Plan Review PWR standing HEP, review final exercise on low floor supine PWR, add hamstrings stretch to HEP, continue with bed mobility        Problem List Patient Active Problem List   Diagnosis Date Noted  . Parkinsonism 05/15/2014  . Chronic anticoagulation -warfarin therapy 02/03/2014  . Atrial fibrillation with RVR 01/31/2014  . Gait instability 11/16/2013  . Encounter for therapeutic drug monitoring 07/20/2013  . Edema 03/03/2010  . ATRIAL FIBRILLATION 04/23/2008  . Elevated lipids 01/31/2008  . Obstructive sleep apnea 01/31/2008  . GLAUCOMA 01/30/2008  . Allergic rhinitis, cause unspecified 01/30/2008  . DEGENERATIVE JOINT DISEASE 01/30/2008    Delrae Sawyers D 07/01/2014, 12:11 PM  [facility] [address] [place], [place], [address] Phone: [phone]   Fax:  [phone]

## 2014-07-03 ENCOUNTER — Ambulatory Visit: Payer: Medicare Other

## 2014-07-03 DIAGNOSIS — R279 Unspecified lack of coordination: Secondary | ICD-10-CM

## 2014-07-03 DIAGNOSIS — R293 Abnormal posture: Secondary | ICD-10-CM

## 2014-07-03 DIAGNOSIS — R262 Difficulty in walking, not elsewhere classified: Secondary | ICD-10-CM

## 2014-07-03 NOTE — Therapy (Signed)
[facility] [address] [place], [place], [address] Phone: [phone]   Fax:  [phone]  Physical Therapy Treatment  Patient Details  Name: [name] MRN: [id number] Date of Birth: 1941/02/22 Referring Provider:  [name], MD  Encounter Date: 07/03/2014      PT End of Session - 07/03/14 1628    Visit Number 4   Number of Visits 17   Date for PT Re-Evaluation 08/17/14   Authorization Type Blue Medicare-G code required   PT Start Time 9242   PT Stop Time 1315   PT Time Calculation (min) 40 min      Past Medical History  Diagnosis Date  . CHF (congestive heart failure)   . PAF (paroxysmal atrial fibrillation)   . Hypertension   . Allergic rhinitis   . Hyperlipidemia   . Lumbar stenosis   . Scoliosis     with radiculopathy L2-S1 with prior surgery  . Small bowel obstruction     versus ileus after last bck surgery  . GERD (gastroesophageal reflux disease)   . OSA (obstructive sleep apnea)     "couldn't handle CPAP; use mouth guard some; not all the time" (01/06/2014)  . DJD (degenerative joint disease)   . Spondylosis   . Arthritis     "all over"  . Chronic back pain     Past Surgical History  Procedure Laterality Date  . Nasal septum surgery  80's  . Total abdominal hysterectomy  02/1993  . Lipoma excision  1980's    "fatty tumors"  . Lumbar disc surgery  02/2009    "ruptured disc"  . Repair dural / csf leak  02/2009  . Cataract extraction w/ intraocular lens  implant, bilateral  2011  . Posterior lumbar fusion  06/2010; 10/2011    "placed screws, rods, spacers both times"  . Back surgery    . Colonoscopy  12/09/2004    There were no vitals taken for this visit.  Visit Diagnosis:  Lack of coordination  Difficulty walking  Posture abnormality      Subjective Assessment - 07/03/14 1238    Symptoms No changes or complaints. She hasn't had any more near falls.   Currently in  Pain? No/denies                Transfer training: sidelying to sit 5x on each side initially with verbal cues then with supervision without cues.       PWR [facility]) - 07/03/14 1258    PWR! exercises Moves in supine;Moves in standing   PWR! Up 10   PWR! Rock 10   PWR! Twist 10   PWR! Step 10   PWR! Up 10   PWR! Rock 10  holding counter top               PT Short Term Goals - 06/25/14 1556    PT SHORT TERM GOAL #1   Title Independent with HEP to address gait, balance, posture impairments Target 07/19/14   PT SHORT TERM GOAL #2   Title Increase gait speed to 2.3 ft/sec for improved efficiency of functional mobility Target 07/19/14   PT SHORT TERM GOAL #3   Title Demonstrate and verbalize increased ease of donning shoes and socks due to increased flexibility. Target 07/19/14   PT SHORT TERM GOAL #4   Title Demonstrate ability to turn 180 degrees while ambulating without an assistive device 5x with supervision and no loss of balance. Target 07/19/14  PT SHORT TERM GOAL #5   Title Increase FGA score to 16/30 for improved balance.           PT Long Term Goals - 06/25/14 1557    PT LONG TERM GOAL #1   Title Verbalize 3 strategies to decrease freezing episodes. Target 08/17/14   PT LONG TERM GOAL #2   Title Increase gait speed to >2.62 ft/sec for community ambulator status. Target 08/17/14   PT LONG TERM GOAL #3   Title Decrease TUG time with our without assistive device to 22 seconds for improved efficiency of functional mobility.  Target 08/17/14   PT LONG TERM GOAL #4   Title Decrease TUG manual time to 38 seconds without freezing episode for improved efficiency. Target 08/17/14   PT LONG TERM GOAL #5   Title Decrease 5x sit to stand to 14 seconds with UE push off for decreased debility. Target 08/17/14   Additional Long Term Goals   Additional Long Term Goals Yes   PT LONG TERM GOAL #6   Title Increase FGA score to 21/30 for improved balance. Target 08/17/14                Plan - 07/03/14 1629    Clinical Impression Statement Pt is making progress with bed mobility but requires verbal cues for correct performance. Pt is expected to continue to make progress.   PT Next Visit Plan review supine to sit, add hamstrings stretch to HEP, core strengthening        Problem List Patient Active Problem List   Diagnosis Date Noted  . Parkinsonism 05/15/2014  . Chronic anticoagulation -warfarin therapy 02/03/2014  . Atrial fibrillation with RVR 01/31/2014  . Gait instability 11/16/2013  . Encounter for therapeutic drug monitoring 07/20/2013  . Edema 03/03/2010  . ATRIAL FIBRILLATION 04/23/2008  . Elevated lipids 01/31/2008  . Obstructive sleep apnea 01/31/2008  . GLAUCOMA 01/30/2008  . Allergic rhinitis, cause unspecified 01/30/2008  . DEGENERATIVE JOINT DISEASE 01/30/2008    Delrae Sawyers D 07/03/2014, 4:31 PM  [facility] [address] [place], [place], [address] Phone: [phone]   Fax:  [phone]

## 2014-07-08 ENCOUNTER — Encounter: Payer: Self-pay | Admitting: Cardiovascular Disease

## 2014-07-09 ENCOUNTER — Ambulatory Visit: Payer: Medicare Other | Attending: Neurology

## 2014-07-09 ENCOUNTER — Telehealth: Payer: Self-pay | Admitting: Cardiovascular Disease

## 2014-07-09 DIAGNOSIS — R262 Difficulty in walking, not elsewhere classified: Secondary | ICD-10-CM

## 2014-07-09 DIAGNOSIS — R279 Unspecified lack of coordination: Secondary | ICD-10-CM

## 2014-07-09 DIAGNOSIS — R293 Abnormal posture: Secondary | ICD-10-CM | POA: Insufficient documentation

## 2014-07-09 NOTE — Patient Instructions (Signed)
HIP: Hamstrings - Short Sitting   Sit on edge of chair. Put one leg straight in front of you and pull foot up. Keep knee straight. Lift chest. Hold 30 seconds. Perform 3 repetitions on each leg daily.  Copyright  VHI. All rights reserved.

## 2014-07-09 NOTE — Telephone Encounter (Signed)
New message      Pt is on warfarin.  PCP presc pt B-keflex 500mg  tid for 5 days.  Will her warfarin dosage need to be changed?

## 2014-07-09 NOTE — Therapy (Signed)
[facility] [address] [place], [place], [address] Phone: [phone]   Fax:  [phone]  Physical Therapy Treatment  Patient Details  Name: [name] MRN: [id number] Date of Birth: 1940/09/17 Referring Provider:  [name], MD  Encounter Date: 07/09/2014      PT End of Session - 07/09/14 1148    Visit Number 5   Number of Visits 17   Date for PT Re-Evaluation 08/17/14   Authorization Type Blue Medicare-G code required   PT Start Time 1104   PT Stop Time 1144   PT Time Calculation (min) 40 min      Past Medical History  Diagnosis Date  . CHF (congestive heart failure)   . PAF (paroxysmal atrial fibrillation)   . Hypertension   . Allergic rhinitis   . Hyperlipidemia   . Lumbar stenosis   . Scoliosis     with radiculopathy L2-S1 with prior surgery  . Small bowel obstruction     versus ileus after last bck surgery  . GERD (gastroesophageal reflux disease)   . OSA (obstructive sleep apnea)     "couldn't handle CPAP; use mouth guard some; not all the time" (01/06/2014)  . DJD (degenerative joint disease)   . Spondylosis   . Arthritis     "all over"  . Chronic back pain     Past Surgical History  Procedure Laterality Date  . Nasal septum surgery  80's  . Total abdominal hysterectomy  02/1993  . Lipoma excision  1980's    "fatty tumors"  . Lumbar disc surgery  02/2009    "ruptured disc"  . Repair dural / csf leak  02/2009  . Cataract extraction w/ intraocular lens  implant, bilateral  2011  . Posterior lumbar fusion  06/2010; 10/2011    "placed screws, rods, spacers both times"  . Back surgery    . Colonoscopy  12/09/2004    There were no vitals taken for this visit.  Visit Diagnosis:  Lack of coordination  Difficulty walking  Posture abnormality      Subjective Assessment - 07/09/14 1109    Symptoms Pt has a UTI and is on antibiotics. Pt and husband state that she is doing  more  around the house and is moving a lot better.   Currently in Pain? No/denies     Hamstring stretch 3x30 seconds each leg.  Gait training with supervision rollator x300' with verbal cues initially for increased step length and upright posture. 49m walk 13.75s Then with single point cane x230' and again after resting x200' with increased shuffling, increased thoracic kyphosis, decreased balance, poor coordination of cane and CGA required to maintain steadiness. 74m walk with cane 15.32 seconds. *recommended pt use rollator for ambulation at home (she does more walking at home than in community) and use RW in community as huband reports it's easier to put in and out of car.  Bed mobility x10 minutes Sit to supine with MIN A to adjust upper body Crunches 2x5 with MIN A Crunches+lateral bend then return to midline 2x5 with MOD A toward right, then toward left, then full excursion from right to left and left to right 2x5 with supervision only. Supine to sit with supervision                     PT Education - 07/09/14 1148    Education provided Yes   Education Details hamstring stretch   Person(s) Educated Patient;Spouse  Methods Explanation;Demonstration;Handout   Comprehension Verbalized understanding;Returned demonstration          PT Short Term Goals - 06/25/14 1556    PT SHORT TERM GOAL #1   Title Independent with HEP to address gait, balance, posture impairments Target 07/19/14   PT SHORT TERM GOAL #2   Title Increase gait speed to 2.3 ft/sec for improved efficiency of functional mobility Target 07/19/14   PT SHORT TERM GOAL #3   Title Demonstrate and verbalize increased ease of donning shoes and socks due to increased flexibility. Target 07/19/14   PT SHORT TERM GOAL #4   Title Demonstrate ability to turn 180 degrees while ambulating without an assistive device 5x with supervision and no loss of balance. Target 07/19/14   PT SHORT TERM GOAL #5   Title Increase FGA  score to 16/30 for improved balance.           PT Long Term Goals - 06/25/14 1557    PT LONG TERM GOAL #1   Title Verbalize 3 strategies to decrease freezing episodes. Target 08/17/14   PT LONG TERM GOAL #2   Title Increase gait speed to >2.62 ft/sec for community ambulator status. Target 08/17/14   PT LONG TERM GOAL #3   Title Decrease TUG time with our without assistive device to 22 seconds for improved efficiency of functional mobility.  Target 08/17/14   PT LONG TERM GOAL #4   Title Decrease TUG manual time to 38 seconds without freezing episode for improved efficiency. Target 08/17/14   PT LONG TERM GOAL #5   Title Decrease 5x sit to stand to 14 seconds with UE push off for decreased debility. Target 08/17/14   Additional Long Term Goals   Additional Long Term Goals Yes   PT LONG TERM GOAL #6   Title Increase FGA score to 21/30 for improved balance. Target 08/17/14               Plan - 07/09/14 1149    Clinical Impression Statement Pt is making progress with bed mobility. Demonstrates improved safety, activity tolerance, posture and gait pattern with rollator vs RW or cane.   PT Next Visit Plan core strengthening, posture training        Problem List Patient Active Problem List   Diagnosis Date Noted  . Parkinsonism 05/15/2014  . Chronic anticoagulation -warfarin therapy 02/03/2014  . Atrial fibrillation with RVR 01/31/2014  . Gait instability 11/16/2013  . Encounter for therapeutic drug monitoring 07/20/2013  . Edema 03/03/2010  . ATRIAL FIBRILLATION 04/23/2008  . Elevated lipids 01/31/2008  . Obstructive sleep apnea 01/31/2008  . GLAUCOMA 01/30/2008  . Allergic rhinitis, cause unspecified 01/30/2008  . DEGENERATIVE JOINT DISEASE 01/30/2008    Delrae Sawyers D 07/09/2014, 11:50 AM  [facility] [address] [place], [place], [address] Phone: [phone]   Fax:  [phone]

## 2014-07-10 NOTE — Telephone Encounter (Signed)
Returned call to pt advised Keflex does not interact with Warfarin, therefor her dosage of Coumadin does not need to be adjusted.  Pt verbalized understanding will keep scheduled follow-up appt.

## 2014-07-12 ENCOUNTER — Ambulatory Visit: Payer: Medicare Other

## 2014-07-15 ENCOUNTER — Ambulatory Visit: Payer: Medicare Other

## 2014-07-15 DIAGNOSIS — R279 Unspecified lack of coordination: Secondary | ICD-10-CM

## 2014-07-15 DIAGNOSIS — R293 Abnormal posture: Secondary | ICD-10-CM

## 2014-07-15 DIAGNOSIS — R262 Difficulty in walking, not elsewhere classified: Secondary | ICD-10-CM

## 2014-07-15 NOTE — Therapy (Signed)
[facility] [address] [place], [place], [address] Phone: [phone]   Fax:  [phone]  Physical Therapy Treatment  Patient Details  Name: [name] MRN: [id number] Date of Birth: [birth year] Referring Provider:  [name], MD  Encounter Date: 07/15/2014      PT End of Session - 07/15/14 1655    Visit Number 6   Number of Visits 17   Date for PT Re-Evaluation 08/17/14   Authorization Type Blue Medicare-G code required   PT Start Time 1319   PT Stop Time 1400   PT Time Calculation (min) 41 min      Past Medical History  Diagnosis Date  . CHF (congestive heart failure)   . PAF (paroxysmal atrial fibrillation)   . Hypertension   . Allergic rhinitis   . Hyperlipidemia   . Lumbar stenosis   . Scoliosis     with radiculopathy L2-S1 with prior surgery  . Small bowel obstruction     versus ileus after last bck surgery  . GERD (gastroesophageal reflux disease)   . OSA (obstructive sleep apnea)     "couldn't handle CPAP; use mouth guard some; not all the time" (01/06/2014)  . DJD (degenerative joint disease)   . Spondylosis   . Arthritis     "all over"  . Chronic back pain     Past Surgical History  Procedure Laterality Date  . Nasal septum surgery  80's  . Total abdominal hysterectomy  02/1993  . Lipoma excision  1980's    "fatty tumors"  . Lumbar disc surgery  02/2009    "ruptured disc"  . Repair dural / csf leak  02/2009  . Cataract extraction w/ intraocular lens  implant, bilateral  2011  . Posterior lumbar fusion  06/2010; 10/2011    "placed screws, rods, spacers both times"  . Back surgery    . Colonoscopy  12/09/2004    There were no vitals taken for this visit.  Visit Diagnosis:  Lack of coordination  Difficulty walking  Posture abnormality      Subjective Assessment - 07/15/14 1324    Symptoms Pt and husband report she is getting around the house better. Pt reports she has  conitnued to use the cane or no AD at home (if she can't find her cane); despite recommendation to use rollator.   Currently in Pain? No/denies      Therex: Supine hip flexor stretch x3 minutes each side  Theract: -Bed mobility-Scooting in hooklying right and left with PWR transition technique and supervision. Pt demonstrated improved upper body weight shift with this today. -Discussed therapist's recommendation to assess if there is any furniture she can get rid of to decrease clutter or to reorganize the placement of furniture in the house to decrease clutter for increased use of the rollator. Discussed progressive nature of parkinson's and the fact that she will likely need the rollator or RW long term.   Neuro re-ed: PWR high floor all 4's: PWR UP x10, PWR rock x10, PWR twist x5 each side with CGA modified using BUE support on high mat rather than quadruped position  Gait training with cane 2x230' with verbal cues for increased step length and step-through pattern and tactile cues for increased upright posture                      PT Short Term Goals - 06/25/14 1556    PT SHORT TERM GOAL #1  Title Independent with HEP to address gait, balance, posture impairments Target 07/19/14   PT SHORT TERM GOAL #2   Title Increase gait speed to 2.3 ft/sec for improved efficiency of functional mobility Target 07/19/14   PT SHORT TERM GOAL #3   Title Demonstrate and verbalize increased ease of donning shoes and socks due to increased flexibility. Target 07/19/14   PT SHORT TERM GOAL #4   Title Demonstrate ability to turn 180 degrees while ambulating without an assistive device 5x with supervision and no loss of balance. Target 07/19/14   PT SHORT TERM GOAL #5   Title Increase FGA score to 16/30 for improved balance.           PT Long Term Goals - 06/25/14 1557    PT LONG TERM GOAL #1   Title Verbalize 3 strategies to decrease freezing episodes. Target 08/17/14   PT LONG TERM  GOAL #2   Title Increase gait speed to >2.62 ft/sec for community ambulator status. Target 08/17/14   PT LONG TERM GOAL #3   Title Decrease TUG time with our without assistive device to 22 seconds for improved efficiency of functional mobility.  Target 08/17/14   PT LONG TERM GOAL #4   Title Decrease TUG manual time to 38 seconds without freezing episode for improved efficiency. Target 08/17/14   PT LONG TERM GOAL #5   Title Decrease 5x sit to stand to 14 seconds with UE push off for decreased debility. Target 08/17/14   Additional Long Term Goals   Additional Long Term Goals Yes   PT LONG TERM GOAL #6   Title Increase FGA score to 21/30 for improved balance. Target 08/17/14               Plan - 07/15/14 1656    Clinical Impression Statement Pt is making progress with all mobility. Continues to demonstrate bradykinesia. Also continues to primarily use cane rather than rollator in her home due to clutter per pt/husband report.    PT Next Visit Plan Add high floor PWR exercises to HEP; begin checking goals        Problem List Patient Active Problem List   Diagnosis Date Noted  . Parkinsonism 05/15/2014  . Chronic anticoagulation -warfarin therapy 02/03/2014  . Atrial fibrillation with RVR 01/31/2014  . Gait instability 11/16/2013  . Encounter for therapeutic drug monitoring 07/20/2013  . Edema 03/03/2010  . ATRIAL FIBRILLATION 04/23/2008  . Elevated lipids 01/31/2008  . Obstructive sleep apnea 01/31/2008  . GLAUCOMA 01/30/2008  . Allergic rhinitis, cause unspecified 01/30/2008  . DEGENERATIVE JOINT DISEASE 01/30/2008    Delrae Sawyers D 07/15/2014, 4:59 PM  [facility] [address] [place], [place], [address] Phone: [phone]   Fax:  [phone]

## 2014-07-16 ENCOUNTER — Ambulatory Visit (INDEPENDENT_AMBULATORY_CARE_PROVIDER_SITE_OTHER): Payer: Medicare Other | Admitting: *Deleted

## 2014-07-16 DIAGNOSIS — Z5181 Encounter for therapeutic drug level monitoring: Secondary | ICD-10-CM

## 2014-07-16 DIAGNOSIS — I480 Paroxysmal atrial fibrillation: Secondary | ICD-10-CM

## 2014-07-16 DIAGNOSIS — I4891 Unspecified atrial fibrillation: Secondary | ICD-10-CM

## 2014-07-16 LAB — POCT INR: INR: 2.6

## 2014-07-19 ENCOUNTER — Ambulatory Visit: Payer: Medicare Other

## 2014-07-19 DIAGNOSIS — R262 Difficulty in walking, not elsewhere classified: Secondary | ICD-10-CM

## 2014-07-19 DIAGNOSIS — R279 Unspecified lack of coordination: Secondary | ICD-10-CM

## 2014-07-19 DIAGNOSIS — R293 Abnormal posture: Secondary | ICD-10-CM

## 2014-07-19 NOTE — Therapy (Signed)
[facility] [address] [place], [place], [address] Phone: [phone]   Fax:  [phone]  Physical Therapy Treatment  Patient Details  Name: [name] MRN: [id number] Date of Birth: September 15, 1940 Referring Provider:  [name], MD  Encounter Date: 07/19/2014      PT End of Session - 07/19/14 1159    Visit Number 7   Number of Visits 17   Date for PT Re-Evaluation 08/17/14   Authorization Type Blue Medicare-G code required   PT Start Time 1105   PT Stop Time 1150   PT Time Calculation (min) 45 min      Past Medical History  Diagnosis Date  . CHF (congestive heart failure)   . PAF (paroxysmal atrial fibrillation)   . Hypertension   . Allergic rhinitis   . Hyperlipidemia   . Lumbar stenosis   . Scoliosis     with radiculopathy L2-S1 with prior surgery  . Small bowel obstruction     versus ileus after last bck surgery  . GERD (gastroesophageal reflux disease)   . OSA (obstructive sleep apnea)     "couldn't handle CPAP; use mouth guard some; not all the time" (01/06/2014)  . DJD (degenerative joint disease)   . Spondylosis   . Arthritis     "all over"  . Chronic back pain     Past Surgical History  Procedure Laterality Date  . Nasal septum surgery  80's  . Total abdominal hysterectomy  02/1993  . Lipoma excision  1980's    "fatty tumors"  . Lumbar disc surgery  02/2009    "ruptured disc"  . Repair dural / csf leak  02/2009  . Cataract extraction w/ intraocular lens  implant, bilateral  2011  . Posterior lumbar fusion  06/2010; 10/2011    "placed screws, rods, spacers both times"  . Back surgery    . Colonoscopy  12/09/2004    There were no vitals taken for this visit.  Visit Diagnosis:  Lack of coordination  Difficulty walking  Posture abnormality      Subjective Assessment - 07/19/14 1108    Symptoms Pt reports she was having some hip pain but it is resolved   Currently in Pain?  No/denies      Therex:  Pt completed 10 minutes on Sci Fit for intensity training--at goal of 55 steps per minute (10% greater than self selected pace), with pt educated regarding the benefits of this method of training for people with Parkinsons.   Neuro re-ed:  High floor PWR! Exercises x10 reps each of PWR! Up for posture; PWR! Rock for weight shift, PWR! Twist for trunk rotation, and PWR! Step for transition. The exercises were modified to be performed at counter top. Written instructions were added for correct form. Pt instructed to maintain UE support on counter for each exercise including the PWR transition for balance/stability.  Sit to stands x10 without UE support with verbal cues for anterior weight shift.                     PT Short Term Goals - 06/25/14 1556    PT SHORT TERM GOAL #1   Title Independent with HEP to address gait, balance, posture impairments Target 07/19/14   PT SHORT TERM GOAL #2   Title Increase gait speed to 2.3 ft/sec for improved efficiency of functional mobility Target 07/19/14   PT SHORT TERM GOAL #3   Title Demonstrate and verbalize increased ease  of donning shoes and socks due to increased flexibility. Target 07/19/14   PT SHORT TERM GOAL #4   Title Demonstrate ability to turn 180 degrees while ambulating without an assistive device 5x with supervision and no loss of balance. Target 07/19/14   PT SHORT TERM GOAL #5   Title Increase FGA score to 16/30 for improved balance.           PT Long Term Goals - 06/25/14 1557    PT LONG TERM GOAL #1   Title Verbalize 3 strategies to decrease freezing episodes. Target 08/17/14   PT LONG TERM GOAL #2   Title Increase gait speed to >2.62 ft/sec for community ambulator status. Target 08/17/14   PT LONG TERM GOAL #3   Title Decrease TUG time with our without assistive device to 22 seconds for improved efficiency of functional mobility.  Target 08/17/14   PT LONG TERM GOAL #4   Title Decrease TUG  manual time to 38 seconds without freezing episode for improved efficiency. Target 08/17/14   PT LONG TERM GOAL #5   Title Decrease 5x sit to stand to 14 seconds with UE push off for decreased debility. Target 08/17/14   Additional Long Term Goals   Additional Long Term Goals Yes   PT LONG TERM GOAL #6   Title Increase FGA score to 21/30 for improved balance. Target 08/17/14               Plan - 07/19/14 1201    Clinical Impression Statement Pt continues to make progress with coordination and mobility. Continue per plan of care.   PT Next Visit Plan Check short term goals        Problem List Patient Active Problem List   Diagnosis Date Noted  . Parkinsonism 05/15/2014  . Chronic anticoagulation -warfarin therapy 02/03/2014  . Atrial fibrillation with RVR 01/31/2014  . Gait instability 11/16/2013  . Encounter for therapeutic drug monitoring 07/20/2013  . Edema 03/03/2010  . ATRIAL FIBRILLATION 04/23/2008  . Elevated lipids 01/31/2008  . Obstructive sleep apnea 01/31/2008  . GLAUCOMA 01/30/2008  . Allergic rhinitis, cause unspecified 01/30/2008  . DEGENERATIVE JOINT DISEASE 01/30/2008   Delrae Sawyers, PT,DPT,NCS 07/19/2014 12:11 PM Phone [phone] FAX [phone]         [facility] [address] [place], [place], [address] Phone: [phone]   Fax:  [phone]

## 2014-07-23 ENCOUNTER — Encounter: Payer: Self-pay | Admitting: Physical Therapy

## 2014-07-23 ENCOUNTER — Ambulatory Visit: Payer: Medicare Other | Admitting: Physical Therapy

## 2014-07-23 DIAGNOSIS — R293 Abnormal posture: Secondary | ICD-10-CM

## 2014-07-23 DIAGNOSIS — R262 Difficulty in walking, not elsewhere classified: Secondary | ICD-10-CM

## 2014-07-23 DIAGNOSIS — R279 Unspecified lack of coordination: Secondary | ICD-10-CM | POA: Diagnosis not present

## 2014-07-24 NOTE — Therapy (Signed)
[facility] [address] [place], [place], [address] Phone: [phone]   Fax:  [phone]  Physical Therapy Treatment  Patient Details  Name: [name] MRN: [id number] Date of Birth: 06-Aug-1940 Referring Provider:  [name], MD  Encounter Date: 07/23/2014      PT End of Session - 07/23/14 1317    Visit Number 8   Number of Visits 17   Date for PT Re-Evaluation 08/17/14   Authorization Type Blue Medicare-G code required   PT Start Time 1104   PT Stop Time 1149   PT Time Calculation (min) 45 min   Equipment Utilized During Treatment Gait belt   Activity Tolerance Patient tolerated treatment well   Behavior During Therapy [facility] for tasks assessed/performed      Past Medical History  Diagnosis Date  . CHF (congestive heart failure)   . PAF (paroxysmal atrial fibrillation)   . Hypertension   . Allergic rhinitis   . Hyperlipidemia   . Lumbar stenosis   . Scoliosis     with radiculopathy L2-S1 with prior surgery  . Small bowel obstruction     versus ileus after last bck surgery  . GERD (gastroesophageal reflux disease)   . OSA (obstructive sleep apnea)     "couldn't handle CPAP; use mouth guard some; not all the time" (01/06/2014)  . DJD (degenerative joint disease)   . Spondylosis   . Arthritis     "all over"  . Chronic back pain     Past Surgical History  Procedure Laterality Date  . Nasal septum surgery  80's  . Total abdominal hysterectomy  02/1993  . Lipoma excision  1980's    "fatty tumors"  . Lumbar disc surgery  02/2009    "ruptured disc"  . Repair dural / csf leak  02/2009  . Cataract extraction w/ intraocular lens  implant, bilateral  2011  . Posterior lumbar fusion  06/2010; 10/2011    "placed screws, rods, spacers both times"  . Back surgery    . Colonoscopy  12/09/2004    There were no vitals taken for this visit.  Visit Diagnosis:  Lack of coordination  Difficulty  walking  Posture abnormality      Subjective Assessment - 07/23/14 1109    Symptoms Pt denies falls or changes since last visit.   Currently in Pain? No/denies          [facility] PT Assessment - 07/23/14 1112    Ambulation/Gait   Gait velocity 2.12 ft/sec no device with CGA   Functional Gait  Assessment   Gait assessed  Yes   Gait Level Surface Walks 20 ft, slow speed, abnormal gait pattern, evidence for imbalance or deviates 10-15 in outside of the 12 in walkway width. Requires more than 7 sec to ambulate 20 ft.   Change in Gait Speed Able to change speed, demonstrates mild gait deviations, deviates 6-10 in outside of the 12 in walkway width, or no gait deviations, unable to achieve a major change in velocity, or uses a change in velocity, or uses an assistive device.   Gait with Horizontal Head Turns Performs head turns smoothly with slight change in gait velocity (eg, minor disruption to smooth gait path), deviates 6-10 in outside 12 in walkway width, or uses an assistive device.   Gait with Vertical Head Turns Performs task with slight change in gait velocity (eg, minor disruption to smooth gait path), deviates 6 - 10 in outside 12 in walkway  width or uses assistive device   Gait and Pivot Turn Turns slowly, requires verbal cueing, or requires several small steps to catch balance following turn and stop   Step Over Obstacle Cannot perform without assistance.   Gait with Narrow Base of Support Ambulates less than 4 steps heel to toe or cannot perform without assistance.   Gait with Eyes Closed Cannot walk 20 ft without assistance, severe gait deviations or imbalance, deviates greater than 15 in outside 12 in walkway width or will not attempt task.   Ambulating Backwards Walks 20 ft, slow speed, abnormal gait pattern, evidence for imbalance, deviates 10-15 in outside 12 in walkway width.   Steps Alternating feet, must use rail.   Total Score 11                             PT Short Term Goals - 07/24/14 4268    PT SHORT TERM GOAL #1   Title Independent with HEP to address gait, balance, posture impairments Target 07/19/14   PT SHORT TERM GOAL #2   Title Increase gait speed to 2.3 ft/sec for improved efficiency of functional mobility Target 07/19/14   Status Not Met   PT SHORT TERM GOAL #3   Title Demonstrate and verbalize increased ease of donning shoes and socks due to increased flexibility. Target 07/19/14   Status Not Met  Per pt report, still unable to perform   PT SHORT TERM GOAL #4   Title Demonstrate ability to turn 180 degrees while ambulating without an assistive device 5x with supervision and no loss of balance. Target 07/19/14   Status Achieved   PT SHORT TERM GOAL #5   Title Increase FGA score to 16/30 for improved balance.   Status Not Met           PT Long Term Goals - 06/25/14 1557    PT LONG TERM GOAL #1   Title Verbalize 3 strategies to decrease freezing episodes. Target 08/17/14   PT LONG TERM GOAL #2   Title Increase gait speed to >2.62 ft/sec for community ambulator status. Target 08/17/14   PT LONG TERM GOAL #3   Title Decrease TUG time with our without assistive device to 22 seconds for improved efficiency of functional mobility.  Target 08/17/14   PT LONG TERM GOAL #4   Title Decrease TUG manual time to 38 seconds without freezing episode for improved efficiency. Target 08/17/14   PT LONG TERM GOAL #5   Title Decrease 5x sit to stand to 14 seconds with UE push off for decreased debility. Target 08/17/14   Additional Long Term Goals   Additional Long Term Goals Yes   PT LONG TERM GOAL #6   Title Increase FGA score to 21/30 for improved balance. Target 08/17/14               Plan - 07/23/14 1318    Clinical Impression Statement STG 2, 3 and 5 unmet, STG 4 met.  Still need to finish checking STG's.  Pt extremely reliant on forward head and trunk to maintain balance.   Rehab Potential Good   PT Frequency 2x / week   PT  Duration 8 weeks   PT Treatment/Interventions ADLs/Self Care Home Management;DME Instruction;Therapeutic activities;Patient/family education;Therapeutic exercise;Balance training;Gait training;Neuromuscular re-education   PT Next Visit Plan Finish checking goals.  Simulated tub transfer? per pt/husband request.   Consulted and Agree with Plan of Care Patient;Family member/caregiver   Family  Member Consulted husband        Problem List Patient Active Problem List   Diagnosis Date Noted  . Parkinsonism 05/15/2014  . Chronic anticoagulation -warfarin therapy 02/03/2014  . Atrial fibrillation with RVR 01/31/2014  . Gait instability 11/16/2013  . Encounter for therapeutic drug monitoring 07/20/2013  . Edema 03/03/2010  . ATRIAL FIBRILLATION 04/23/2008  . Elevated lipids 01/31/2008  . Obstructive sleep apnea 01/31/2008  . GLAUCOMA 01/30/2008  . Allergic rhinitis, cause unspecified 01/30/2008  . DEGENERATIVE JOINT DISEASE 01/30/2008    Narda Bonds 07/24/2014, 8:35 AM  [facility] [address] [place], [place], [address] Phone: [phone]   Fax:  [place], [place] 07/24/2014 8:35 AM Phone: [phone] Fax: [phone]

## 2014-07-25 ENCOUNTER — Encounter: Payer: Self-pay | Admitting: Neurology

## 2014-07-25 ENCOUNTER — Ambulatory Visit (INDEPENDENT_AMBULATORY_CARE_PROVIDER_SITE_OTHER): Payer: Medicare Other | Admitting: Neurology

## 2014-07-25 VITALS — BP 153/81 | HR 66 | Ht 65.0 in | Wt 206.8 lb

## 2014-07-25 DIAGNOSIS — G20 Parkinson's disease: Secondary | ICD-10-CM

## 2014-07-25 MED ORDER — RASAGILINE MESYLATE 0.5 MG PO TABS: 0.5000 mg | ORAL_TABLET | Freq: Every day | ORAL | Status: DC

## 2014-07-25 NOTE — Progress Notes (Signed)
Faxed Registration form for [employer] for approval of the Azilect at 5:50pm on 07/25/14.

## 2014-07-25 NOTE — Patient Instructions (Signed)
1) Carbidopa/Levodopa(Sinemet) 25/100mg . One tab 4 times a day.  Start Azilect daily at 10am. 2) There is an excellent Parkinson's support group in [place]. It is called "[employer]" and meets once a month. Please call Amy Marriot or Vianne Bulls at [phone] for more information. There is no charge/fee for joining the group.  3) Physical Therapy for gait and safety. Discussed OT as well, they will get back to me (she has difficulty dressing, putting on shoes and socks, transferring) 4) Follow up in 4-6 weeks  -Try to separate Sinemet from food (especially protein-rich foods like meat, dairy, eggs) by about 30-60 mins - this will help the absorption of the medication. If you have some nausea with the medication, you can take it with some light food like crackers or ginger ale  - Please avoid the following medications while using Azilect: dextromethorphan (can be found in cough medicines), Luvox (fluvoxamine), Prozac (fluoxetine), Flexeril (cyclobenzaprine), Darvon (propoxyphene), St. [name]'s Wort, Tramadol, Methadone, and Demerol (meperidine).  If you need to take ciprofloxacin for an infection, cut down the Azilect to  tab prior to starting the cipro

## 2014-07-25 NOTE — Progress Notes (Signed)
[facility]    Provider:  Dr [name] Referring Provider: [name], MD Primary Care Physician:  Simona Huh, MD  CC:  Parkinson's  HPI:  [name] is a 74 y.o. female here as a follow up.   Interval History 12/09/1940: She feels great. At home she is not using the cane. She is having some dyspepsia. PT has been good. She is surprised how many people have PD, she went to the support group. No side effects of the Sinemet. She is doing more around the house, getting around so much better.  Interval history 05/15/2014: [name] is a 74 y.o. female here as a follow up. She was a previous patient of Dr. [name] and is transitioning care. She is getting weaker. She is on a new medication for afib. Problems walking, she is walking very slow, she has not fallen, can't turn, she freezes, she has difficulty getting out of a chair. She can't get her feet to move. She has pain in the feet ,in the toes. She has shuffling, doesn't pick up her feet. Has constipation. Smell is ok. No tremor. Has stiffness. She has some vision changes. Has dizziness upon standing. No FHx of neurodegenerative disorders. Memory is good overall. Takes gabapentin for the pain in her feet but she can't tolerate a higher dose. Her voice is lower, her handwriting is smaller. No tremor. Symptoms started over a year ago and are progressively getting worse. 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 She has not fallen. She eats well. No history of dopamine-blocking agents.   11/16/2013 Dr. [name]: [name] is a 74 y.o. female here as a follow up with new onset dizziness. States having these episodes off and on for the past 3 months. Fluctuates in severity but has been constant. Notes feeling light headed, no sensation of vertigo or movement. Notes feeling off balance, unsteady on her feet. Uses a walker, needs a walker or wall to support herself. States these  episodes came on acutely. Episodes are worse with standing but can occur sitting. Not precipitated or made worse by movement or head turning. Notes some increased muscle weakness. Continues to have lower back pain, is followed by neurosurgery, has received steroid injections in her hips.   EMG/NCS results: Nerve conduction studies done on both lower extremities shows no clear evidence of a generalized peripheral neuropathy. 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 On the right lower extremity, EMG evaluation reveals evidence of a mild primarily chronic L5 and S1 radiculopathy. No other significant abnormalities were seen.   Reviewed notes, labs and imaging from outside physicians, which showed: TSH WNL.  Personally reviewed MRI of the brain.Some atrophy and non-specific white-matter changes.  Review of Systems: Patient complains of symptoms per HPI as well as the following symptoms: constipation, runny nose, drooling. Pertinent negatives per HPI. All others negative.   History   Social History  . Marital Status: Married    Spouse Name: [name]  . Number of Children: 2  . Years of Education: 12th    Occupational History  . retired     Radio producer work   Social History  Main Topics  . Smoking status: Never Smoker   . Smokeless tobacco: Never Used  . Alcohol Use: No     Comment: 02/01/2014 " glasseof wine once in a blue moon; < once/year"  . Drug Use: No  . Sexual Activity: No     Comment: hysterectomy   Other Topics Concern  . Not on file   Social History Narrative   Patient lives at home with husband [name].   Patient is retired.    Patient has a high school education.    Patient has 2 children     Family History  Problem Relation Age of Onset  . Arthritis Mother   . Heart attack Father   . Hypertension Sister   . Asthma Sister   . Allergies Sister     Past Medical History  Diagnosis Date  . CHF (congestive heart failure)   . PAF (paroxysmal atrial fibrillation)   . Hypertension   . Allergic rhinitis   . Hyperlipidemia   . Lumbar stenosis   . Scoliosis     with radiculopathy L2-S1 with prior surgery  . Small bowel obstruction     versus ileus after last bck surgery  . GERD (gastroesophageal reflux disease)   . OSA (obstructive sleep apnea)     "couldn't handle CPAP; use mouth guard some; not all the time" (01/06/2014)  . DJD (degenerative joint disease)   . Spondylosis   . Arthritis     "all over"  . Chronic back pain     Past Surgical History  Procedure Laterality Date  . Nasal septum surgery  80's  . Total abdominal hysterectomy  02/1993  . Lipoma excision  1980's    "fatty tumors"  . Lumbar disc surgery  02/2009    "ruptured disc"  . Repair dural / csf leak  02/2009  . Cataract extraction w/ intraocular lens  implant, bilateral  2011  . Posterior lumbar fusion  06/2010; 10/2011    "placed screws, rods, spacers both times"  . Back surgery    . Colonoscopy  12/09/2004    Current Outpatient Prescriptions  Medication Sig Dispense Refill  . acetaminophen (TYLENOL) 500 MG tablet Take 1,000 mg by mouth daily as needed (pain).    . bimatoprost (LUMIGAN) 0.01 % SOLN Place 1 drop into both eyes at bedtime.    . calcium carbonate (TUMS EX) 750 MG chewable tablet Chew 2 tablets by mouth at bedtime.    . carbidopa-levodopa (SINEMET IR) 25-100 MG per tablet Take 1 tablet by mouth 4 (four) times daily. May take an extra pill in the evenings if needed 150 tablet 6  . Cinnamon 500 MG capsule Take 1,000 mg by mouth at bedtime.     Marland Kitchen EPINEPHrine (EPIPEN 2-PAK) 0.3 mg/0.3 mL IJ SOAJ injection Inject 0.3 mg into  the muscle as needed (allergic reaction). Inject into the thigh    . fexofenadine (ALLEGRA) 180 MG tablet Take 180 mg by mouth daily. For allergies    . flecainide (TAMBOCOR) 50 MG tablet Take 1 tablet (50 mg total) by mouth 2 (two) times daily. 180 tablet 1  . furosemide (LASIX) 20 MG tablet Take 20 mg by mouth daily.     Marland Kitchen gabapentin (NEURONTIN) 300 MG capsule Take 300 mg by mouth 2 (two) times daily.    Marland Kitchen HYDROcodone-acetaminophen (NORCO/VICODIN) 5-325 MG per tablet As needed for pain    . KRILL OIL PO Take 1 capsule by mouth daily.     Tonye Royalty Rolan Lipa)  145 MCG CAPS capsule Take 145 mcg by mouth daily as needed (constipation).    . Magnesium 400 MG TABS Take 400 mg by mouth at bedtime.    . metolazone (ZAROXOLYN) 5 MG tablet 1 tab every other day    . metoprolol succinate (TOPROL-XL) 100 MG 24 hr tablet Take 1 tablet (100 mg total) by mouth daily. Take with or immediately following a meal. 90 tablet 1  . Polyvinyl Alcohol-Povidone (REFRESH OP) Place 1 drop into both eyes 2 (two) times daily.    . potassium chloride SA (K-DUR,KLOR-CON) 20 MEQ tablet 1 tab every  day    . pramipexole (MIRAPEX) 0.5 MG tablet Take 0.5 mg by mouth at bedtime. For restless legs    . pravastatin (PRAVACHOL) 40 MG tablet Take 40 mg by mouth at bedtime.     Marland Kitchen PRESCRIPTION MEDICATION Inject 1 application into the skin once a week. On Tuesdays (allergy shot)    . warfarin (COUMADIN) 5 MG tablet Take as directed by coumadin clinic 60 tablet 0   No current facility-administered medications for this visit.    Allergies as of 07/25/2014 - Review Complete 07/19/2014  Allergen Reaction Noted  . Lyrica [pregabalin] Other (See Comments) 01/31/2014  . Zocor [simvastatin] Other (See Comments) 04/17/2012  . Relafen [nabumetone] Rash 04/17/2012    Vitals: Ht 5\' 5"  (1.651 m)  Wt 206 lb 12.8 oz (93.804 kg)  BMI 34.41 kg/m2 Last Weight:  Wt Readings from Last 1 Encounters:  07/25/14 206 lb 12.8 oz (93.804 kg)    Last Height:   Ht Readings from Last 1 Encounters:  07/25/14 5\' 5"  (1.651 m)   Neuro: Detailed Neurologic Exam  Speech:Mild hypophonia. No aphasia or dysarthria.  Cognition:  The patient is oriented to person, place, and time;   Cranial Nerves: Masked Facies  The pupils are equal, round, and reactive to light. Visual fields are full to finger confrontation. Extraocular movements are intact. Trigeminal sensation is intact and the muscles of mastication are normal. The face is symmetric. The palate elevates in the midline. Voice is normal. Shoulder shrug is normal. The tongue has normal motion without fasciculations.   Coordination:  finger to nose intact but slow, cannot perform HTS due to body habitus. Good finger tapping.   Gait:  Shuffling, slow. Unable to assess arm swing, she uses a cane. Stooped.   Motor Observation: Masked Facies  no involuntary movements noted. Tone:  Mild Increased tone uppers with facilitation  Posture:  stooped   Strength:  Left DF 3+/5, otherwise Strength is V/V in the upper and lower limbs.    Assessment/Plan: 74 year old female here as a follow up. She was a former patient of Dr. [name]. Exam shows shuffling gait with stooped posture, masked facies, hypophonia and hypographia, possible cogwheeling in the upper extremities, masked facies. No tremor. No history of Dopa blocking drugs. 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 1) Carbidopa/Levodopa(Sinemet) 25/100mg . One tab 4 times a day.   Start Azilect daily at 10am. 2) There is an excellent Parkinson's support group in [place]. It is called "[facility]" and meets once a month. Please call Amy Marriot or Vianne Bulls at [phone] for more information. There is no charge/fee for joining the group.  3) Physical Therapy for gait and safety. Discussed OT as well, they will get back to me (she has difficulty dressing, putting on shoes and socks,  transferring) 4) Follow up in 4-6 weeks  Sarina Ill, MD  [facility] [phone]  [place], [place] [address]  Phone [phone] Fax [phone]  A total of 30 minutes was spent face-to-face with this patient. Over half this time was spent on counseling patient on the Parkinson's diagnosis and different diagnostic and therapeutic options available.

## 2014-07-26 ENCOUNTER — Ambulatory Visit: Payer: Medicare Other

## 2014-07-26 DIAGNOSIS — R279 Unspecified lack of coordination: Secondary | ICD-10-CM

## 2014-07-26 NOTE — Therapy (Signed)
[facility] [address] [place], [place], [address] Phone: [phone]   Fax:  [phone]  Physical Therapy Treatment  Patient Details  Name: [name] MRN: [id number] Date of Birth: 07-01-1940 Referring Provider:  [name], MD  Encounter Date: 07/26/2014      PT End of Session - 07/26/14 1611    Visit Number 9   Number of Visits 17   Date for PT Re-Evaluation 08/17/14   Authorization Type Blue Medicare-G code required   PT Start Time 1320   PT Stop Time 1403   PT Time Calculation (min) 43 min      Past Medical History  Diagnosis Date  . CHF (congestive heart failure)   . PAF (paroxysmal atrial fibrillation)   . Hypertension   . Allergic rhinitis   . Hyperlipidemia   . Lumbar stenosis   . Scoliosis     with radiculopathy L2-S1 with prior surgery  . Small bowel obstruction     versus ileus after last bck surgery  . GERD (gastroesophageal reflux disease)   . OSA (obstructive sleep apnea)     "couldn't handle CPAP; use mouth guard some; not all the time" (01/06/2014)  . DJD (degenerative joint disease)   . Spondylosis   . Arthritis     "all over"  . Chronic back pain     Past Surgical History  Procedure Laterality Date  . Nasal septum surgery  80's  . Total abdominal hysterectomy  02/1993  . Lipoma excision  1980's    "fatty tumors"  . Lumbar disc surgery  02/2009    "ruptured disc"  . Repair dural / csf leak  02/2009  . Cataract extraction w/ intraocular lens  implant, bilateral  2011  . Posterior lumbar fusion  06/2010; 10/2011    "placed screws, rods, spacers both times"  . Back surgery    . Colonoscopy  12/09/2004    There were no vitals taken for this visit.  Visit Diagnosis:  Lack of coordination      Subjective Assessment - 07/26/14 1326    Symptoms Pt denies falls or changes since last visit. Also reports she is moving better at home   Currently in Pain? No/denies        Therex: Passive stretching x5 minutes R knee to chest 3x30 sec R knee to opposite side of chest 3x30 sec    Theract: -Practiced sit to stand from mechanical therapy mat in lowest setting, MOD I with BUE push up -Sit to stand from 14" stool MOD I with BUE push up -sit to stand from 10" stool with BOSU ball stacked on top MOD I with BUE push up -2x lowering to kneeling on floor and rise to stand with BUE support MOD I -1x lowering to sitting on floor and rise to stand with CGA and verbal cues and BUE support -Practiced simulated bathtub environment with pt practicing rise in "bathtub" pt required extra time for this task, maximal verbal cues and demonstration, BUE pushup and MIN A. She is expected to progress toward her personal goal of being able to do this with supervision or possibly MOD I eventually. *Pt was instructed not to try transferring in/out of tub at home yet.                   PT Short Term Goals - 07/24/14 7622    PT SHORT TERM GOAL #1   Title Independent with HEP to address gait, balance,  posture impairments Target 07/19/14   PT SHORT TERM GOAL #2   Title Increase gait speed to 2.3 ft/sec for improved efficiency of functional mobility Target 07/19/14   Status Not Met   PT SHORT TERM GOAL #3   Title Demonstrate and verbalize increased ease of donning shoes and socks due to increased flexibility. Target 07/19/14   Status Not Met  Per pt report, still unable to perform   PT SHORT TERM GOAL #4   Title Demonstrate ability to turn 180 degrees while ambulating without an assistive device 5x with supervision and no loss of balance. Target 07/19/14   Status Achieved   PT SHORT TERM GOAL #5   Title Increase FGA score to 16/30 for improved balance.   Status Not Met           PT Long Term Goals - 06/25/14 1557    PT LONG TERM GOAL #1   Title Verbalize 3 strategies to decrease freezing episodes. Target 08/17/14   PT LONG TERM GOAL #2   Title Increase gait  speed to >2.62 ft/sec for community ambulator status. Target 08/17/14   PT LONG TERM GOAL #3   Title Decrease TUG time with our without assistive device to 22 seconds for improved efficiency of functional mobility.  Target 08/17/14   PT LONG TERM GOAL #4   Title Decrease TUG manual time to 38 seconds without freezing episode for improved efficiency. Target 08/17/14   PT LONG TERM GOAL #5   Title Decrease 5x sit to stand to 14 seconds with UE push off for decreased debility. Target 08/17/14   Additional Long Term Goals   Additional Long Term Goals Yes   PT LONG TERM GOAL #6   Title Increase FGA score to 21/30 for improved balance. Target 08/17/14               Plan - 07/26/14 1612    Clinical Impression Statement Pt is making progress toward long term goals and demonstrated significant improvement in ability to rise from lower surfaces today. She is expected to continue making functional progress.   PT Next Visit Plan *g code*Finish checking short term goals; practice rolling supine to kneeling to tall kneeling to promote improved tub transfer        Problem List Patient Active Problem List   Diagnosis Date Noted  . Parkinsonism 05/15/2014  . Chronic anticoagulation -warfarin therapy 02/03/2014  . Atrial fibrillation with RVR 01/31/2014  . Gait instability 11/16/2013  . Encounter for therapeutic drug monitoring 07/20/2013  . Edema 03/03/2010  . ATRIAL FIBRILLATION 04/23/2008  . Elevated lipids 01/31/2008  . Obstructive sleep apnea 01/31/2008  . GLAUCOMA 01/30/2008  . Allergic rhinitis, cause unspecified 01/30/2008  . DEGENERATIVE JOINT DISEASE 01/30/2008   Delrae Sawyers, PT,DPT,NCS 07/26/2014 4:15 PM Phone [phone] FAX [phone]         [facility] [address] [place], [place], [address] Phone: [phone]   Fax:  [phone]

## 2014-07-30 ENCOUNTER — Encounter: Payer: Self-pay | Admitting: Physical Therapy

## 2014-07-30 ENCOUNTER — Ambulatory Visit: Payer: Medicare Other | Admitting: Physical Therapy

## 2014-07-30 DIAGNOSIS — R279 Unspecified lack of coordination: Secondary | ICD-10-CM

## 2014-07-30 DIAGNOSIS — R293 Abnormal posture: Secondary | ICD-10-CM

## 2014-07-30 NOTE — Therapy (Signed)
[facility] [address] [place], [place], [address] Phone: [phone]   Fax:  [phone]  Physical Therapy Treatment  Patient Details  Name: [name] MRN: [id number] Date of Birth: 11-05-1940 Referring Provider:  [name], MD  Encounter Date: 07/30/2014      PT End of Session - 07/30/14 1512    Visit Number 10   Number of Visits 17   Date for PT Re-Evaluation 08/17/14   Authorization Type Blue Medicare-G code required   PT Start Time 1319   PT Stop Time 1402   PT Time Calculation (min) 43 min   Activity Tolerance Patient tolerated treatment well   Behavior During Therapy [facility] for tasks assessed/performed      Past Medical History  Diagnosis Date  . CHF (congestive heart failure)   . PAF (paroxysmal atrial fibrillation)   . Hypertension   . Allergic rhinitis   . Hyperlipidemia   . Lumbar stenosis   . Scoliosis     with radiculopathy L2-S1 with prior surgery  . Small bowel obstruction     versus ileus after last bck surgery  . GERD (gastroesophageal reflux disease)   . OSA (obstructive sleep apnea)     "couldn't handle CPAP; use mouth guard some; not all the time" (01/06/2014)  . DJD (degenerative joint disease)   . Spondylosis   . Arthritis     "all over"  . Chronic back pain     Past Surgical History  Procedure Laterality Date  . Nasal septum surgery  80's  . Total abdominal hysterectomy  02/1993  . Lipoma excision  1980's    "fatty tumors"  . Lumbar disc surgery  02/2009    "ruptured disc"  . Repair dural / csf leak  02/2009  . Cataract extraction w/ intraocular lens  implant, bilateral  2011  . Posterior lumbar fusion  06/2010; 10/2011    "placed screws, rods, spacers both times"  . Back surgery    . Colonoscopy  12/09/2004    There were no vitals taken for this visit.  Visit Diagnosis:  Posture abnormality  Lack of coordination      Subjective Assessment - 07/30/14 1328     Symptoms denies falls or changes since last visit.  Has been doing more housework, squating and bending.   Currently in Pain? No/denies                    [facility] Adult PT Treatment/Exercise - 07/30/14 0001    Transfers   Transfers Sit to Stand;Stand to Sit   Sit to Stand Other/comment  10 times sit to stand from mat with mininal UE support   Posture/Postural Control   Posture Comments Supine arms overhead for back stretch x 30 seconds x 2;standing in door frame x 1 minute hold   Lumbar Exercises: Stretches   Active Hamstring Stretch 2 reps;30 seconds  seated edge of mat   Double Knee to Chest Stretch 2 reps;30 seconds   Piriformis Stretch 1 rep;30 seconds   Ankle Exercises: Seated   Ankle Circles/Pumps AROM;Both;15 reps           [facility]) - 07/30/14 1353    PWR! exercises Moves in [place];Moves in standing  modified quadriped at chair   PWR! Up 20   PWR! Rock 20   PWR! Twist 20   PWR! Step 20   Comments Modified quadriped at back of chair for support   PWR! Up 10  PWR! [employer] 20   PWR! Twist 20   PWR Step 20   Comments Using back of chair for support             PT Education - 08-17-2014 1511    Education provided Yes   Education Details ankle pumps and door frame for posture   Person(s) Educated Patient   Methods Explanation;Demonstration   Comprehension Verbalized understanding          PT Short Term Goals - 08-17-14 1514    PT SHORT TERM GOAL #1   Title Independent with HEP to address gait, balance, posture impairments Target 07/19/14   Status Achieved           PT Long Term Goals - 06/25/14 1557    PT LONG TERM GOAL #1   Title Verbalize 3 strategies to decrease freezing episodes. Target 08/17/14   PT LONG TERM GOAL #2   Title Increase gait speed to >2.62 ft/sec for community ambulator status. Target 08/17/14   PT LONG TERM GOAL #3   Title Decrease TUG time with our without assistive device to 22 seconds for improved efficiency  of functional mobility.  Target 08/17/14   PT LONG TERM GOAL #4   Title Decrease TUG manual time to 38 seconds without freezing episode for improved efficiency. Target 08/17/14   PT LONG TERM GOAL #5   Title Decrease 5x sit to stand to 14 seconds with UE push off for decreased debility. Target 08/17/14   Additional Long Term Goals   Additional Long Term Goals Yes   PT LONG TERM GOAL #6   Title Increase FGA score to 21/30 for improved balance. Target 08/17/14               Plan - 08-17-2014 1512    Clinical Impression Statement Pt met STG # 1.  Continue PT per POC toward LTG's.   Rehab Potential Good   PT Frequency 2x / week   PT Duration 8 weeks   PT Treatment/Interventions ADLs/Self Care Home Management;DME Instruction;Therapeutic activities;Patient/family education;Therapeutic exercise;Balance training;Gait training;Neuromuscular re-education   PT Next Visit Plan Practice rolling supine to kneeling to tall kneeling to promote improved tub transfer.   Consulted and Agree with Plan of Care Patient          G-Codes - August 17, 2014 1521    Functional Assessment Tool Used FGA 11/30;2.12 ft/sec gait velocity      Problem List Patient Active Problem List   Diagnosis Date Noted  . Parkinsonism 05/15/2014  . Chronic anticoagulation -warfarin therapy 02/03/2014  . Atrial fibrillation with RVR 01/31/2014  . Gait instability 11/16/2013  . Encounter for therapeutic drug monitoring 07/20/2013  . Edema 03/03/2010  . ATRIAL FIBRILLATION 04/23/2008  . Elevated lipids 01/31/2008  . Obstructive sleep apnea 01/31/2008  . GLAUCOMA 01/30/2008  . Allergic rhinitis, cause unspecified 01/30/2008  . DEGENERATIVE JOINT DISEASE 01/30/2008    Narda Bonds 2014/08/17, 3:22 PM  [facility] [address] [place], [place], [address] Phone: [phone]   Fax:  [place], [place] 17-Aug-2014 15:22 Phone: [phone] Fax: [phone]

## 2014-07-31 DIAGNOSIS — R279 Unspecified lack of coordination: Secondary | ICD-10-CM | POA: Diagnosis not present

## 2014-07-31 NOTE — Therapy (Signed)
[facility] [address] [place], [place], [address] Phone: [phone]   Fax:  [phone]  Physical Therapy Treatment  Patient Details  Name: [name] MRN: [id number] Date of Birth: Sep 15, 1940 Referring Provider:  [name], MD  Encounter Date: 07/30/2014      PT End of Session - 07/30/14 1512    Visit Number 10   Number of Visits 17   Date for PT Re-Evaluation 08/17/14   Authorization Type Blue Medicare-G code required   PT Start Time 1319   PT Stop Time 1402   PT Time Calculation (min) 43 min   Activity Tolerance Patient tolerated treatment well   Behavior During Therapy [facility] for tasks assessed/performed      Past Medical History  Diagnosis Date  . CHF (congestive heart failure)   . PAF (paroxysmal atrial fibrillation)   . Hypertension   . Allergic rhinitis   . Hyperlipidemia   . Lumbar stenosis   . Scoliosis     with radiculopathy L2-S1 with prior surgery  . Small bowel obstruction     versus ileus after last bck surgery  . GERD (gastroesophageal reflux disease)   . OSA (obstructive sleep apnea)     "couldn't handle CPAP; use mouth guard some; not all the time" (01/06/2014)  . DJD (degenerative joint disease)   . Spondylosis   . Arthritis     "all over"  . Chronic back pain     Past Surgical History  Procedure Laterality Date  . Nasal septum surgery  80's  . Total abdominal hysterectomy  02/1993  . Lipoma excision  1980's    "fatty tumors"  . Lumbar disc surgery  02/2009    "ruptured disc"  . Repair dural / csf leak  02/2009  . Cataract extraction w/ intraocular lens  implant, bilateral  2011  . Posterior lumbar fusion  06/2010; 10/2011    "placed screws, rods, spacers both times"  . Back surgery    . Colonoscopy  12/09/2004    There were no vitals taken for this visit.  Visit Diagnosis:  Posture abnormality  Lack of coordination      Subjective Assessment - 07/30/14 1328     Symptoms denies falls or changes since last visit.  Has been doing more housework, squating and bending.   Currently in Pain? No/denies                       [facility]) - 07/30/14 1353    PWR! exercises Moves in [place];Moves in standing  modified quadriped at chair   PWR! Up 20   PWR! Rock 20   PWR! Twist 20   PWR! Step 20   Comments Modified quadriped at back of chair for support   PWR! Up 10   PWR! Rock 20   PWR! Twist 20   PWR Step 20   Comments Using back of chair for support             PT Education - 07/30/14 1511    Education provided Yes   Education Details ankle pumps and door frame for posture   Person(s) Educated Patient   Methods Explanation;Demonstration   Comprehension Verbalized understanding          PT Short Term Goals - 07/30/14 1514    PT SHORT TERM GOAL #1   Title Independent with HEP to address gait, balance, posture impairments Target 07/19/14   Status Achieved  PT Long Term Goals - 06/25/14 1557    PT LONG TERM GOAL #1   Title Verbalize 3 strategies to decrease freezing episodes. Target 08/17/14   PT LONG TERM GOAL #2   Title Increase gait speed to >2.62 ft/sec for community ambulator status. Target 08/17/14   PT LONG TERM GOAL #3   Title Decrease TUG time with our without assistive device to 22 seconds for improved efficiency of functional mobility.  Target 08/17/14   PT LONG TERM GOAL #4   Title Decrease TUG manual time to 38 seconds without freezing episode for improved efficiency. Target 08/17/14   PT LONG TERM GOAL #5   Title Decrease 5x sit to stand to 14 seconds with UE push off for decreased debility. Target 08/17/14   Additional Long Term Goals   Additional Long Term Goals Yes   PT LONG TERM GOAL #6   Title Increase FGA score to 21/30 for improved balance. Target 08/17/14               Plan - 07/30/14 1512    Clinical Impression Statement Pt met STG # 1.  Continue PT per POC toward LTG's.    Rehab Potential Good   PT Frequency 2x / week   PT Duration 8 weeks   PT Treatment/Interventions ADLs/Self Care Home Management;DME Instruction;Therapeutic activities;Patient/family education;Therapeutic exercise;Balance training;Gait training;Neuromuscular re-education   PT Next Visit Plan Practice rolling supine to kneeling to tall kneeling to promote improved tub transfer.   Consulted and Agree with Plan of Care Patient          G-Codes - 2014/08/27 1247    Functional Assessment Tool Used FGA 11/30;2.12 ft/sec gait velocity   Functional Limitation Mobility: Walking and moving around   Mobility: Walking and Moving Around Current Status [phone]) At least 40 percent but less than 60 percent impaired, limited or restricted   Mobility: Walking and Moving Around Goal Status [phone]) At least 20 percent but less than 40 percent impaired, limited or restricted      Problem List Patient Active Problem List   Diagnosis Date Noted  . Parkinsonism 05/15/2014  . Chronic anticoagulation -warfarin therapy 02/03/2014  . Atrial fibrillation with RVR 01/31/2014  . Gait instability 11/16/2013  . Encounter for therapeutic drug monitoring 07/20/2013  . Edema 03/03/2010  . ATRIAL FIBRILLATION 04/23/2008  . Elevated lipids 01/31/2008  . Obstructive sleep apnea 01/31/2008  . GLAUCOMA 01/30/2008  . Allergic rhinitis, cause unspecified 01/30/2008  . DEGENERATIVE JOINT DISEASE 01/30/2008    Jakarri Lesko W. Aug 27, 2014, 12:49 PM  Treatment performed on 07/30/14 by Nita Sells, PTA  [facility] [address] [place], [place], [address] Phone: [phone]   Fax:  [phone]

## 2014-08-06 ENCOUNTER — Ambulatory Visit: Payer: Medicare Other | Attending: Neurology | Admitting: Physical Therapy

## 2014-08-06 ENCOUNTER — Encounter: Payer: Self-pay | Admitting: Physical Therapy

## 2014-08-06 DIAGNOSIS — R262 Difficulty in walking, not elsewhere classified: Secondary | ICD-10-CM | POA: Diagnosis not present

## 2014-08-06 DIAGNOSIS — R279 Unspecified lack of coordination: Secondary | ICD-10-CM | POA: Diagnosis present

## 2014-08-06 DIAGNOSIS — R293 Abnormal posture: Secondary | ICD-10-CM | POA: Diagnosis not present

## 2014-08-06 NOTE — Patient Instructions (Signed)
[employer]-   Walking Program-5 times a week working up to 30 minutes at a time.  Working on best posture and safe speed.  Exercises from therapy-daily.  Working on BIG movements and intensity.  Cardio-3 times a week working up to 30 minutes at a time.

## 2014-08-06 NOTE — Therapy (Signed)
[facility] [address] [place], [place], [address] Phone: [phone]   Fax:  [phone]  Physical Therapy Treatment  Patient Details  Name: [name] MRN: [id number] Date of Birth: 1940/09/08 Referring Provider:  [name], MD  Encounter Date: 08/06/2014      PT End of Session - 08/06/14 1653    Visit Number 11   Number of Visits 17   Date for PT Re-Evaluation 08/17/14   Authorization Type Blue Medicare-G code required   PT Start Time 1318   PT Stop Time 1403   PT Time Calculation (min) 45 min   Equipment Utilized During Treatment Gait belt   Activity Tolerance Patient tolerated treatment well   Behavior During Therapy [facility] for tasks assessed/performed      Past Medical History  Diagnosis Date  . CHF (congestive heart failure)   . PAF (paroxysmal atrial fibrillation)   . Hypertension   . Allergic rhinitis   . Hyperlipidemia   . Lumbar stenosis   . Scoliosis     with radiculopathy L2-S1 with prior surgery  . Small bowel obstruction     versus ileus after last bck surgery  . GERD (gastroesophageal reflux disease)   . OSA (obstructive sleep apnea)     "couldn't handle CPAP; use mouth guard some; not all the time" (01/06/2014)  . DJD (degenerative joint disease)   . Spondylosis   . Arthritis     "all over"  . Chronic back pain     Past Surgical History  Procedure Laterality Date  . Nasal septum surgery  80's  . Total abdominal hysterectomy  02/1993  . Lipoma excision  1980's    "fatty tumors"  . Lumbar disc surgery  02/2009    "ruptured disc"  . Repair dural / csf leak  02/2009  . Cataract extraction w/ intraocular lens  implant, bilateral  2011  . Posterior lumbar fusion  06/2010; 10/2011    "placed screws, rods, spacers both times"  . Back surgery    . Colonoscopy  12/09/2004    There were no vitals taken for this visit.  Visit Diagnosis:  Difficulty walking  Lack of  coordination      Subjective Assessment - 08/06/14 1323    Symptoms Denies fall or changes.  Hit leg on getting in car with open abrasion but not sore.   Currently in Pain? No/denies         Simulated tub transfer on mat trying to roll to supine to quadriped to tall kneeling.  Pt with significant difficulty getting to prone/quadriped and Needed mod assist to get to quadriped and mod assist from quadriped to tall kneeling.  Discussed not trying tub transfer at this time, even With assist of daughter and husband.  Pt agrees.        [facility] Adult PT Treatment/Exercise - 08/06/14 1649    Transfers   Transfers Sit to Stand;Stand to Sit   Sit to Stand 5: Supervision;With upper extremity assist;From chair/3-in-1;Other/comment  times 10 from mat   Stand to Sit 5: Supervision   Ambulation/Gait   Ambulation/Gait Yes   Ambulation/Gait Assistance 4: Min guard;5: Supervision   Ambulation/Gait Assistance Details improved gait speed, posture and safety with Rollator.   Ambulation Distance (Feet) 400 Feet  120 twice   Assistive device Rolling walker;Rollator   Gait Pattern Decreased dorsiflexion - right;Decreased dorsiflexion - left;Decreased stride length;Poor foot clearance - left   Ambulation Surface Level;Indoor   Knee/Hip Exercises: Aerobic  Stationary Bike Scifit leve 2.0 all 4 extremities x 8 minutes with rpm>65                PT Education - 08/06/14 1658    Education provided Yes   Education Details [employer]   Person(s) Educated Patient          PT Short Term Goals - 07/30/14 1514    PT SHORT TERM GOAL #1   Title Independent with HEP to address gait, balance, posture impairments Target 07/19/14   Status Achieved           PT Long Term Goals - 06/25/14 1557    PT LONG TERM GOAL #1   Title Verbalize 3 strategies to decrease freezing episodes. Target 08/17/14   PT LONG TERM GOAL #2   Title Increase gait speed to >2.62 ft/sec for  community ambulator status. Target 08/17/14   PT LONG TERM GOAL #3   Title Decrease TUG time with our without assistive device to 22 seconds for improved efficiency of functional mobility.  Target 08/17/14   PT LONG TERM GOAL #4   Title Decrease TUG manual time to 38 seconds without freezing episode for improved efficiency. Target 08/17/14   PT LONG TERM GOAL #5   Title Decrease 5x sit to stand to 14 seconds with UE push off for decreased debility. Target 08/17/14   Additional Long Term Goals   Additional Long Term Goals Yes   PT LONG TERM GOAL #6   Title Increase FGA score to 21/30 for improved balance. Target 08/17/14               Plan - 08/06/14 1654    Clinical Impression Statement Pt with improved gait with rollator (vs rolling walker).  Pt has rollator at home but states she doesnt use often because husband has a hard time getting in car.  Instructed pt on how to fold rollator and fold bar to allow for ease of transport.  Pt's goal is to go to [employer] graduation and use Rollator.   Pt will benefit from skilled therapeutic intervention in order to improve on the following deficits Abnormal gait;Decreased coordination;Difficulty walking;Decreased mobility;Decreased strength;Decreased balance   Rehab Potential Good   PT Frequency 2x / week   PT Duration 8 weeks   PT Treatment/Interventions ADLs/Self Care Home Management;DME Instruction;Therapeutic activities;Patient/family education;Therapeutic exercise;Balance training;Gait training;Neuromuscular re-education   PT Next Visit Plan Continue gait with Rollator on even and uneven surfaces if able.  Follow up with pt on exercise room/equipment at [employer].   Consulted and Agree with Plan of Care Patient        Problem List Patient Active Problem List   Diagnosis Date Noted  . Parkinsonism 05/15/2014  . Chronic anticoagulation -warfarin therapy 02/03/2014  . Atrial fibrillation with RVR 01/31/2014  . Gait instability 11/16/2013   . Encounter for therapeutic drug monitoring 07/20/2013  . Edema 03/03/2010  . ATRIAL FIBRILLATION 04/23/2008  . Elevated lipids 01/31/2008  . Obstructive sleep apnea 01/31/2008  . GLAUCOMA 01/30/2008  . Allergic rhinitis, cause unspecified 01/30/2008  . DEGENERATIVE JOINT DISEASE 01/30/2008    Narda Bonds 08/06/2014, 5:01 PM  [facility] [address] [place], [place], [address] Phone: [phone]   Fax:  [facility] 08/06/2014 5:01 PM Phone: [phone] Fax: [phone]

## 2014-08-12 ENCOUNTER — Ambulatory Visit: Payer: Medicare Other

## 2014-08-12 DIAGNOSIS — R279 Unspecified lack of coordination: Secondary | ICD-10-CM | POA: Diagnosis not present

## 2014-08-12 DIAGNOSIS — R293 Abnormal posture: Secondary | ICD-10-CM

## 2014-08-12 DIAGNOSIS — R262 Difficulty in walking, not elsewhere classified: Secondary | ICD-10-CM

## 2014-08-12 NOTE — Therapy (Signed)
[facility] [address] [place], [place], [address] Phone: [phone]   Fax:  [phone]  Physical Therapy Treatment  Patient Details  Name: [name] MRN: [id number] Date of Birth: [birth year] Referring Provider:  [name], MD  Encounter Date: 08/12/2014      PT End of Session - 08/12/14 1709    Visit Number 12   Number of Visits 17   Date for PT Re-Evaluation 08/17/14   Authorization Type Blue Medicare-G code required   PT Start Time 1315   PT Stop Time 1402   PT Time Calculation (min) 47 min      Past Medical History  Diagnosis Date  . CHF (congestive heart failure)   . PAF (paroxysmal atrial fibrillation)   . Hypertension   . Allergic rhinitis   . Hyperlipidemia   . Lumbar stenosis   . Scoliosis     with radiculopathy L2-S1 with prior surgery  . Small bowel obstruction     versus ileus after last bck surgery  . GERD (gastroesophageal reflux disease)   . OSA (obstructive sleep apnea)     "couldn't handle CPAP; use mouth guard some; not all the time" (01/06/2014)  . DJD (degenerative joint disease)   . Spondylosis   . Arthritis     "all over"  . Chronic back pain     Past Surgical History  Procedure Laterality Date  . Nasal septum surgery  80's  . Total abdominal hysterectomy  02/1993  . Lipoma excision  1980's    "fatty tumors"  . Lumbar disc surgery  02/2009    "ruptured disc"  . Repair dural / csf leak  02/2009  . Cataract extraction w/ intraocular lens  implant, bilateral  2011  . Posterior lumbar fusion  06/2010; 10/2011    "placed screws, rods, spacers both times"  . Back surgery    . Colonoscopy  12/09/2004    There were no vitals taken for this visit.  Visit Diagnosis:  Difficulty walking  Lack of coordination  Posture abnormality      Subjective Assessment - 08/12/14 1322    Symptoms Pt reported that she hit her leg on a door a couple weeks ago. It's taking a  while to heal     Gait training x 1000' with rollator outdoors on grass and uneven concrete. MOD I on upward and even concrete and supervision on downward slopes  Ambulation without assistive device with tactile cues for erect posture, then while carrying a ball in both hands, then while tossing a ball upward and catching it while walking for multi-tasking/manual task. Then walking back and forth in [place], carrying ball and performing ball tap across midline against alternate sides of the wall with verbal cues to tap overhead for improved upright posture.  FGA completed see details below.       [facility] PT Assessment - 08/12/14 0001    Functional Gait  Assessment   Gait assessed  Yes   Gait Level Surface Walks 20 ft in less than 7 sec but greater than 5.5 sec, uses assistive device, slower speed, mild gait deviations, or deviates 6-10 in outside of the 12 in walkway width.   Change in Gait Speed Able to change speed, demonstrates mild gait deviations, deviates 6-10 in outside of the 12 in walkway width, or no gait deviations, unable to achieve a major change in velocity, or uses a change in velocity, or uses an assistive device.   Gait with  Horizontal Head Turns Performs head turns with moderate changes in gait velocity, slows down, deviates 10-15 in outside 12 in walkway width but recovers, can continue to walk.   Gait with Vertical Head Turns Performs task with severe disruption of gait (eg, staggers 15 in outside 12 in walkway width, loses balance, stops, reaches for wall).   Gait and Pivot Turn Pivot turns safely in greater than 3 sec and stops with no loss of balance, or pivot turns safely within 3 sec and stops with mild imbalance, requires small steps to catch balance.   Step Over Obstacle Is able to step over one shoe box (4.5 in total height) without changing gait speed. No evidence of imbalance.   Gait with Narrow Base of Support Ambulates less than 4 steps heel to toe or cannot perform  without assistance.   Gait with Eyes Closed Walks 20 ft, uses assistive device, slower speed, mild gait deviations, deviates 6-10 in outside 12 in walkway width. Ambulates 20 ft in less than 9 sec but greater than 7 sec.   Ambulating Backwards Walks 20 ft, uses assistive device, slower speed, mild gait deviations, deviates 6-10 in outside 12 in walkway width.   Steps Alternating feet, must use rail.   Total Score 15                            PT Short Term Goals - 07/30/14 1514    PT SHORT TERM GOAL #1   Title Independent with HEP to address gait, balance, posture impairments Target 07/19/14   Status Achieved           PT Long Term Goals - 06/25/14 1557    PT LONG TERM GOAL #1   Title Verbalize 3 strategies to decrease freezing episodes. Target 08/17/14   PT LONG TERM GOAL #2   Title Increase gait speed to >2.62 ft/sec for community ambulator status. Target 08/17/14   PT LONG TERM GOAL #3   Title Decrease TUG time with our without assistive device to 22 seconds for improved efficiency of functional mobility.  Target 08/17/14   PT LONG TERM GOAL #4   Title Decrease TUG manual time to 38 seconds without freezing episode for improved efficiency. Target 08/17/14   PT LONG TERM GOAL #5   Title Decrease 5x sit to stand to 14 seconds with UE push off for decreased debility. Target 08/17/14   Additional Long Term Goals   Additional Long Term Goals Yes   PT LONG TERM GOAL #6   Title Increase FGA score to 21/30 for improved balance. Target 08/17/14               Plan - 08/12/14 1710    Clinical Impression Statement Pt demonstrated good safety with rollator outdoors except when descending a slope--she tends to have significant increase in speed with some festination. She reports she has not attempted tub transfer yet. She reports increased ease with bed mobility. She is expected to meet most goals and plan to d/c this week.   PT Next Visit Plan finish checking goals,  review simulated tub transfers and d/c        Problem List Patient Active Problem List   Diagnosis Date Noted  . Parkinsonism 05/15/2014  . Chronic anticoagulation -warfarin therapy 02/03/2014  . Atrial fibrillation with RVR 01/31/2014  . Gait instability 11/16/2013  . Encounter for therapeutic drug monitoring 07/20/2013  . Edema 03/03/2010  . ATRIAL FIBRILLATION 04/23/2008  .  Elevated lipids 01/31/2008  . Obstructive sleep apnea 01/31/2008  . GLAUCOMA 01/30/2008  . Allergic rhinitis, cause unspecified 01/30/2008  . DEGENERATIVE JOINT DISEASE 01/30/2008   Delrae Sawyers, PT,DPT,NCS 08/12/2014 5:14 PM Phone [phone] FAX [phone]         [facility] [address] [place], [place], [address] Phone: [phone]   Fax:  [phone]

## 2014-08-13 ENCOUNTER — Ambulatory Visit (INDEPENDENT_AMBULATORY_CARE_PROVIDER_SITE_OTHER): Payer: Medicare Other | Admitting: *Deleted

## 2014-08-13 DIAGNOSIS — Z5181 Encounter for therapeutic drug level monitoring: Secondary | ICD-10-CM

## 2014-08-13 DIAGNOSIS — I4891 Unspecified atrial fibrillation: Secondary | ICD-10-CM

## 2014-08-13 DIAGNOSIS — I480 Paroxysmal atrial fibrillation: Secondary | ICD-10-CM

## 2014-08-13 LAB — POCT INR: INR: 2.9

## 2014-08-15 ENCOUNTER — Ambulatory Visit: Payer: Medicare Other

## 2014-08-15 DIAGNOSIS — R279 Unspecified lack of coordination: Secondary | ICD-10-CM | POA: Diagnosis not present

## 2014-08-15 DIAGNOSIS — R262 Difficulty in walking, not elsewhere classified: Secondary | ICD-10-CM

## 2014-08-15 NOTE — Patient Instructions (Signed)
Tips to reduce freezing episodes with standing or walking:  1. Stand tall with your feet wide, so that you can rock and weight shift through your hips. 2. Don't try to fight the freeze: if you begin taking slower, faster, smaller steps, STOP, get your posture tall, and RESET your posture and balance.  Take a deep breath before taking the BIG step to start again. 3. March in place, with high knee stepping, to get started walking again. 4. Use auditory cues:  Count out loud, think of a familiar tune or song or cadence, use pocket metronome, to use rhythm to get started walking again. 5. Use visual cues:  Use a line to step over, use laser pointer line to step over, (using BIG steps) to start walking again. 6. Use visual targets to keep your posture tall (look ahead and focus on an object or target at eye level). 7. As you approach where your destination with walking, count your steps out loud and/or focus on your target with your eyes until you are fully there. 8. Use appropriate assistive device, as advised by your physical therapist to assist with taking longer, consistent steps. 9.     

## 2014-08-16 DIAGNOSIS — R279 Unspecified lack of coordination: Secondary | ICD-10-CM | POA: Diagnosis not present

## 2014-08-16 NOTE — Therapy (Signed)
[facility] [address] [place], [place], [address] Phone: [phone]   Fax:  [phone]  Physical Therapy Treatment  Patient Details  Name: [name] MRN: [id number] Date of Birth: Dec 06, 1940 Referring Provider:  [name], MD  Encounter Date: 08/15/2014      PT End of Session - 08/16/14 1649    Visit Number 13   Number of Visits 17   Date for PT Re-Evaluation 08/17/14   Authorization Type Blue Medicare-G code required   PT Start Time 6073   PT Stop Time 1530   PT Time Calculation (min) 48 min      Past Medical History  Diagnosis Date  . CHF (congestive heart failure)   . PAF (paroxysmal atrial fibrillation)   . Hypertension   . Allergic rhinitis   . Hyperlipidemia   . Lumbar stenosis   . Scoliosis     with radiculopathy L2-S1 with prior surgery  . Small bowel obstruction     versus ileus after last bck surgery  . GERD (gastroesophageal reflux disease)   . OSA (obstructive sleep apnea)     "couldn't handle CPAP; use mouth guard some; not all the time" (01/06/2014)  . DJD (degenerative joint disease)   . Spondylosis   . Arthritis     "all over"  . Chronic back pain     Past Surgical History  Procedure Laterality Date  . Nasal septum surgery  80's  . Total abdominal hysterectomy  02/1993  . Lipoma excision  1980's    "fatty tumors"  . Lumbar disc surgery  02/2009    "ruptured disc"  . Repair dural / csf leak  02/2009  . Cataract extraction w/ intraocular lens  implant, bilateral  2011  . Posterior lumbar fusion  06/2010; 10/2011    "placed screws, rods, spacers both times"  . Back surgery    . Colonoscopy  12/09/2004    There were no vitals filed for this visit.  Visit Diagnosis:  Difficulty walking  Lack of coordination      Subjective Assessment - 08/15/14 1450    Symptoms (p) Pt continues to report that there isn't pain with ambulating on the left leg (where she has a slow  healing wound), however the skin around the wound is red and slightly indurated and there is tenderness with palpation.   Therapist recommended pt see her PCP regarding possible mild infection and slow healing of the wound. Pt verbalized understanding.   Currently in Pain? (p) Yes   Pain Score (p) 1    Pain Location (p) Leg   Pain Orientation (p) Left   Aggravating Factors  (p) palpation around the wound            [facility] PT Assessment - 08/16/14 0001    Timed Up and Go Test   TUG Normal TUG;Manual TUG   Normal TUG (seconds) 16.75  with rollator; and 13.79 without rollator   Manual TUG (seconds) 19.1   Functional Gait  Assessment   Gait assessed  Yes   Gait Level Surface Walks 20 ft in less than 7 sec but greater than 5.5 sec, uses assistive device, slower speed, mild gait deviations, or deviates 6-10 in outside of the 12 in walkway width.   Change in Gait Speed Able to smoothly change walking speed without loss of balance or gait deviation. Deviate no more than 6 in outside of the 12 in walkway width.   Gait with Horizontal Head Turns  Performs head turns smoothly with slight change in gait velocity (eg, minor disruption to smooth gait path), deviates 6-10 in outside 12 in walkway width, or uses an assistive device.   Gait with Vertical Head Turns Performs task with moderate change in gait velocity, slows down, deviates 10-15 in outside 12 in walkway width but recovers, can continue to walk.   Gait and Pivot Turn Pivot turns safely in greater than 3 sec and stops with no loss of balance, or pivot turns safely within 3 sec and stops with mild imbalance, requires small steps to catch balance.   Step Over Obstacle Is able to step over 2 stacked shoe boxes taped together (9 in total height) without changing gait speed. No evidence of imbalance.   Gait with Narrow Base of Support Ambulates less than 4 steps heel to toe or cannot perform without assistance.   Gait with Eyes Closed Walks 20 ft,  uses assistive device, slower speed, mild gait deviations, deviates 6-10 in outside 12 in walkway width. Ambulates 20 ft in less than 9 sec but greater than 7 sec.   Ambulating Backwards Walks 20 ft, uses assistive device, slower speed, mild gait deviations, deviates 6-10 in outside 12 in walkway width.   Steps Alternating feet, must use rail.   Total Score 19      Therapy session:  Gait training: Checked TUG with and without rollator, TUG manual without rollator, functional gait assessment, gait speed. (see goals section and flowsheet for details)  Self care: Spent initial 10 minutes of session discussing pt's wound and having a second therapist take a look at it. Made recommendation for pt to contact her PCP about this, in case of infection.  Educated patient and husband regarding strategies to prevent freezing and pt demonstrated each strategy. See pt instructions for details. Pt also able to recall 3 strategies she plans to use.                        PT Short Term Goals - 07/30/14 1514    PT SHORT TERM GOAL #1   Title Independent with HEP to address gait, balance, posture impairments Target 07/19/14   Status Achieved           PT Long Term Goals - 08/15/14 1516    PT LONG TERM GOAL #1   Title Verbalize 3 strategies to decrease freezing episodes. Target 08/17/14   Status Achieved   PT LONG TERM GOAL #2   Title Increase gait speed to >2.62 ft/sec for community ambulator status. Target 08/17/14   Status Not Met  2.37 sec with rollator   PT LONG TERM GOAL #3   Title Decrease TUG time with our without assistive device to 22 seconds for improved efficiency of functional mobility.  Target 08/17/14   Status Achieved   PT LONG TERM GOAL #4   Title Decrease TUG manual time to 38 seconds without freezing episode for improved efficiency. Target 08/17/14   Status Achieved   PT LONG TERM GOAL #5   Title Decrease 5x sit to stand to 14 seconds with UE push off for  decreased debility. Target 08/17/14   Status Achieved   PT LONG TERM GOAL #6   Title Increase FGA score to 21/30 for improved balance. Target 08/17/14   Status Not Met  19/30                 G-Codes - 08-31-14 1653    Functional Assessment Tool  Used FGA 19/30   Functional Limitation Mobility: Walking and moving around   Mobility: Walking and Moving Around Goal Status [phone]) At least 20 percent but less than 40 percent impaired, limited or restricted   Mobility: Walking and Moving Around Discharge Status [phone]) At least 20 percent but less than 40 percent impaired, limited or restricted      Problem List Patient Active Problem List   Diagnosis Date Noted  . Parkinsonism 05/15/2014  . Chronic anticoagulation -warfarin therapy 02/03/2014  . Atrial fibrillation with RVR 01/31/2014  . Gait instability 11/16/2013  . Encounter for therapeutic drug monitoring 07/20/2013  . Edema 03/03/2010  . ATRIAL FIBRILLATION 04/23/2008  . Elevated lipids 01/31/2008  . Obstructive sleep apnea 01/31/2008  . GLAUCOMA 01/30/2008  . Allergic rhinitis, cause unspecified 01/30/2008  . DEGENERATIVE JOINT DISEASE 01/30/2008    Delrae Sawyers D 08/16/2014, 4:53 PM  [facility] [place] [address] [place], [place], [address] Phone: [phone]   Fax:  [phone]     PHYSICAL THERAPY DISCHARGE SUMMARY  Visits from Start of Care: 13  Current functional level related to goals / functional outcomes: Pt met 4/6 long term goals. See goal section above.   Remaining deficits: Pt made significant progress in gait speed, balance, and functional mobility; however she does continue to have some limitations in functional mobility and balance due to her movement disorder.   Education / Equipment: Home exericese program; tips to decrease freezing episodes Plan: Patient agrees to discharge.  Patient goals were partially met. Patient is  being discharged due to meeting the stated rehab goals.  ?????

## 2014-09-02 ENCOUNTER — Other Ambulatory Visit: Payer: Self-pay

## 2014-09-02 MED ORDER — WARFARIN SODIUM 5 MG PO TABS: ORAL_TABLET | ORAL | Status: DC

## 2014-09-23 ENCOUNTER — Encounter: Payer: Self-pay | Admitting: Neurology

## 2014-09-23 ENCOUNTER — Telehealth: Payer: Self-pay | Admitting: *Deleted

## 2014-09-23 ENCOUNTER — Ambulatory Visit (INDEPENDENT_AMBULATORY_CARE_PROVIDER_SITE_OTHER): Payer: Medicare Other | Admitting: Neurology

## 2014-09-23 VITALS — BP 129/68 | HR 76 | Temp 98.1°F | Wt 208.0 lb

## 2014-09-23 DIAGNOSIS — G20 Parkinson's disease: Secondary | ICD-10-CM | POA: Diagnosis not present

## 2014-09-23 MED ORDER — CARBIDOPA-LEVODOPA 25-100 MG PO TABS: 1.5000 | ORAL_TABLET | Freq: Four times a day (QID) | ORAL | Status: DC

## 2014-09-23 NOTE — Telephone Encounter (Signed)
I contacted ins and provided clinical info requesting Tier exception for Azilect.  Request is currently under review Ref Key: [id number]

## 2014-09-23 NOTE — Telephone Encounter (Signed)
I called back.  Spoke with Debbie.  Explained we were trying to see if they offer a tier exception for Azilect.  She reviewed the account and said they have approved it to go through as a Tier 3.  Unfortunately, they cannot provide [place] with co-pay amounts, but said the tier could not be reduced any further than that per her plan's policy.  I tried to call the patient, but got no answer.

## 2014-09-23 NOTE — Progress Notes (Signed)
[facility]    Provider: Dr [name] Referring Provider: [name], MD Primary Care Physician: Simona Huh, MD  CC: Parkinson's Disease  HPI: [name] is a 74 y.o. female here as a follow up.   Interval History 09/23/2014:   She hasn't noticed any difference with Azilect. Thinks it will be expensive. No falls. Using a cane more. More difficulty turning. Can get up herself. She doesn't use a cane in the house. Mopped the kitchen floor. Legs have been swelling since starting the Azilect, more than normal and more persistent than normal.   Interval History 07/25/2013: She feels great. At home she is not using the cane. She is having some dyspepsia. PT has been good. She is surprised how many people have PD, she went to the support group. No side effects of the Sinemet. She is doing more around the house, getting around so much better.  Interval history 05/15/2014: [name] is a 74 y.o. female here as a follow up. She was a previous patient of Dr. [name] and is transitioning care. She is getting weaker. She is on a new medication for afib. Problems walking, she is walking very slow, she has not fallen, can't turn, she freezes, she has difficulty getting out of a chair. She can't get her feet to move. She has pain in the feet ,in the toes. She has shuffling, doesn't pick up her feet. Has constipation. Smell is ok. No tremor. Has stiffness. She has some vision changes. Has dizziness upon standing. No FHx of neurodegenerative disorders. Memory is good overall. Takes gabapentin for the pain in her feet but she can't tolerate a higher dose. Her voice is lower, her handwriting is smaller. No tremor. Symptoms started over a year ago and are progressively getting worse. 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 She has not fallen. She eats well. No history of dopamine-blocking agents.   11/16/2013 Dr. [name]: [name] is a  74 y.o. female here as a follow up with new onset dizziness. States having these episodes off and on for the past 3 months. Fluctuates in severity but has been constant. Notes feeling light headed, no sensation of vertigo or movement. Notes feeling off balance, unsteady on her feet. Uses a walker, needs a walker or wall to support herself. States these episodes came on acutely. Episodes are worse with standing but can occur sitting. Not precipitated or made worse by movement or head turning. Notes some increased muscle weakness. Continues to have lower back pain, is followed by neurosurgery, has received steroid injections in her hips.   EMG/NCS results: Nerve conduction studies done on both lower extremities shows no clear evidence of a generalized peripheral neuropathy. 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 On the right lower extremity, EMG evaluation reveals evidence of a mild primarily chronic L5 and S1 radiculopathy. No other significant abnormalities were seen.    Review of Systems: Patient complains of symptoms per HPI as well as the following symptoms: Runny nose, constipation, leg swelling, sleep talking, incontinence of bladder, urgency, dizziness,. Pertinent negatives per HPI. All others negative.   History   Social History  .  Marital Status: Married    Spouse Name: [name]  . Number of Children: 2  . Years of Education: 12th    Occupational History  . retired     Radio producer work   Social History Main Topics  . Smoking status: Never Smoker   . Smokeless tobacco: Never Used  . Alcohol Use: No     Comment: 02/01/2014 " glasseof  wine once in a blue moon; < once/year"  . Drug Use: No  . Sexual Activity: No     Comment: hysterectomy   Other Topics Concern  . Not on file   Social History Narrative   Patient lives at home with husband [name].   Patient is retired.   Patient has a high school education.    Patient has 2 children     Family History  Problem Relation Age of Onset  . Arthritis Mother   . Heart attack Father   . Hypertension Sister   . Asthma Sister   . Allergies Sister     Past Medical History  Diagnosis Date  . CHF (congestive heart failure)   . PAF (paroxysmal atrial fibrillation)   . Hypertension   . Allergic rhinitis   . Hyperlipidemia   . Lumbar stenosis   . Scoliosis     with radiculopathy L2-S1 with prior surgery  . Small bowel obstruction     versus ileus after last bck surgery  . GERD (gastroesophageal reflux disease)   . OSA (obstructive sleep apnea)     "couldn't handle CPAP; use mouth guard some; not all the time" (01/06/2014)  . DJD (degenerative joint disease)   . Spondylosis   . Arthritis     "all over"  . Chronic back pain     Past Surgical History  Procedure Laterality Date  . Nasal septum surgery  80's  . Total abdominal hysterectomy  02/1993  . Lipoma excision  1980's    "fatty tumors"  . Lumbar disc surgery  02/2009    "ruptured disc"  . Repair dural / csf leak  02/2009  . Cataract extraction w/ intraocular lens  implant, bilateral  2011  . Posterior lumbar fusion  06/2010; 10/2011    "placed screws, rods, spacers both times"  . Back surgery    . Colonoscopy  12/09/2004    Current Outpatient Prescriptions  Medication Sig Dispense Refill  . acetaminophen (TYLENOL) 500 MG tablet Take 1,000 mg by mouth daily as needed (pain).    . bimatoprost (LUMIGAN) 0.01 % SOLN Place 1 drop into both eyes at bedtime.    . calcium carbonate (TUMS EX) 750 MG chewable tablet Chew 2 tablets by mouth at bedtime.    . carbidopa-levodopa (SINEMET IR) 25-100 MG per tablet Take 1  tablet by mouth 4 (four) times daily. May take an extra pill in the evenings if needed 150 tablet 6  . Cinnamon 500 MG capsule Take 1,000 mg by mouth at bedtime.     Marland Kitchen EPINEPHrine (EPIPEN 2-PAK) 0.3 mg/0.3 mL IJ SOAJ injection Inject 0.3 mg into the muscle as needed (allergic reaction). Inject into the thigh    . fexofenadine (ALLEGRA) 180 MG tablet Take 180 mg by mouth daily. For allergies    . flecainide (TAMBOCOR) 50 MG tablet Take 1 tablet (50 mg total) by mouth 2 (two) times daily. 180 tablet 1  . furosemide (LASIX) 20 MG tablet Take 20 mg by mouth daily.     Marland Kitchen gabapentin (NEURONTIN) 300 MG capsule Take 300 mg  by mouth 2 (two) times daily.    Marland Kitchen KRILL OIL PO Take 350 mg by mouth daily.     . Linaclotide (LINZESS) 145 MCG CAPS capsule Take 145 mcg by mouth daily as needed (constipation).    . Magnesium 400 MG TABS Take 400 mg by mouth at bedtime.    . metolazone (ZAROXOLYN) 5 MG tablet 1 tab every other day    . metoprolol succinate (TOPROL-XL) 100 MG 24 hr tablet Take 1 tablet (100 mg total) by mouth daily. Take with or immediately following a meal. 90 tablet 1  . Polyvinyl Alcohol-Povidone (REFRESH OP) Place 1 drop into both eyes 2 (two) times daily.    . potassium chloride SA (K-DUR,KLOR-CON) 20 MEQ tablet 1 tab every  day    . pramipexole (MIRAPEX) 0.5 MG tablet Take 0.5 mg by mouth at bedtime. For restless legs    . pravastatin (PRAVACHOL) 40 MG tablet Take 40 mg by mouth at bedtime.     Marland Kitchen PRESCRIPTION MEDICATION Inject 1 application into the skin once a week. On Tuesdays (allergy shot)    . rasagiline (AZILECT) 0.5 MG TABS tablet Take 1 tablet (0.5 mg total) by mouth daily. 30 tablet 6  . warfarin (COUMADIN) 5 MG tablet Take as directed by coumadin clinic 60 tablet 1  . HYDROcodone-acetaminophen (NORCO/VICODIN) 5-325 MG per tablet As needed for pain     No current facility-administered medications for this visit.    Allergies as of 09/23/2014 - Review Complete 09/23/2014  Allergen  Reaction Noted  . Lyrica [pregabalin] Other (See Comments) 01/31/2014  . Zocor [simvastatin] Other (See Comments) 04/17/2012  . Relafen [nabumetone] Rash 04/17/2012    Vitals: BP 129/68 mmHg  Pulse 76  Temp(Src) 98.1 F (36.7 C)  Wt 208 lb (94.348 kg) Last Weight:  Wt Readings from Last 1 Encounters:  09/23/14 208 lb (94.348 kg)   Last Height:   Ht Readings from Last 1 Encounters:  07/25/14 5\' 5"  (1.651 m)   Neuro: Detailed Neurologic Exam  Speech:Mild hypophonia. No aphasia or dysarthria.  Cognition:  The patient is oriented to person, place, and time;   Cranial Nerves: Masked Facies  The pupils are equal, round, and reactive to light. Visual fields are full to finger confrontation. Extraocular movements are intact. Trigeminal sensation is intact and the muscles of mastication are normal. The face is symmetric. The palate elevates in the midline. Voice is normal. Shoulder shrug is normal. The tongue has normal motion without fasciculations.   Coordination:  finger to nose intact but slow, cannot perform HTS due to body habitus. Good finger tapping.   Gait:  Shuffling, slow. Unable to assess arm swing, she uses a cane. Stooped.   Motor Observation: Masked Facies  no involuntary movements noted. Tone:  Mild Increased tone uppers with facilitation  Posture:  stooped   Strength:  Left DF 3+/5, otherwise Strength is V/V in the upper and lower limbs.    Assessment/Plan: 74 year old female here as a follow up. She was a former patient of Dr. [name]. Exam shows shuffling gait with stooped posture, masked facies, hypophonia and hypographia, possible cogwheeling in the upper extremities, masked facies. No tremor. No history of Dopa blocking drugs. 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 1) Carbidopa/Levodopa(Sinemet) 25/100mg . One tab 4 times a day, responded well. Side effects possibly from Azilect 0.5mg . Will stop. Can always try  again later. Will slowly titrate the Sinemet to 11/2 pills tid.  2) There is an excellent Parkinson's support  group in [place]. It is called "[employer]" and meets once a month. Please call Amy Marriot or Vianne Bulls at [phone] for more information. There is no charge/fee for joining the group.  3) Follow up in 3 months      Assessment/Plan:    Sarina Ill, MD  [facility] [address] [place], [place] [address]  Phone [phone] Fax [phone]  A total of 15 minutes was spent face-to-face with this patient. Over half this time was spent on counseling patient on the PD diagnosis and different diagnostic and therapeutic options available.

## 2014-09-23 NOTE — Telephone Encounter (Signed)
[name] from [facility] called wanting to inform the nurse that the script for Azilect does not need to be pre-approved. C/B [id number]

## 2014-09-23 NOTE — Telephone Encounter (Signed)
Talked with Janett Billow, our pharmacy tech and she said she will submit information to her insurance company to see if she can get any discount or financial support. Janett Billow said to let patient know she should be expecting a call from her insurance company within the next few days.

## 2014-09-23 NOTE — Patient Instructions (Signed)
Overall you are doing fairly well but I do want to suggest a few things today:   Remember to drink plenty of fluid, eat healthy meals and do not skip any meals. Try to eat protein with a every meal and eat a healthy snack such as fruit or nuts in between meals. Try to keep a regular sleep-wake schedule and try to exercise daily, particularly in the form of walking, 20-30 minutes a day, if you can.   As far as your medications are concerned, I would like to suggest: Carbidopa/Levodopa 25-100 1.5 tabs 4x a day. -Try to separate Sinemet from food (especially protein-rich foods like meat, dairy, eggs) by about 30-60 mins - this will help the absorption of the medication. If you have some nausea with the medication, you can take it with some light food like crackers or ginger ale. Watch for dizziness/lightheadedness.   I would like to see you back in 3 months, sooner if we need to. Please call us with any interim questions, concerns, problems, updates or refill requests.   Please also call us for any test results so we can go over those with you on the phone.  My clinical assistant and will answer any of your questions and relay your messages to me and also relay most of my messages to you.   Our phone number is [phone]. We also have an after hours call service for urgent matters and there is a physician on-call for urgent questions. For any emergencies you know to call 911 or go to the nearest emergency room

## 2014-09-24 ENCOUNTER — Ambulatory Visit (INDEPENDENT_AMBULATORY_CARE_PROVIDER_SITE_OTHER): Payer: Medicare Other | Admitting: *Deleted

## 2014-09-24 DIAGNOSIS — I480 Paroxysmal atrial fibrillation: Secondary | ICD-10-CM | POA: Diagnosis not present

## 2014-09-24 DIAGNOSIS — I4891 Unspecified atrial fibrillation: Secondary | ICD-10-CM | POA: Diagnosis not present

## 2014-09-24 DIAGNOSIS — Z5181 Encounter for therapeutic drug level monitoring: Secondary | ICD-10-CM | POA: Diagnosis not present

## 2014-09-24 LAB — POCT INR: INR: 2

## 2014-09-30 NOTE — Telephone Encounter (Signed)
After several attempts, I was finally able to reach the patient.  Relayed info from ins.  She verbalized understanding.  She has info for assistance program, however, she says she has not contacted them yet.  She will follow up with ins regarding co-pay amount and assistance program if necessary.  Says she will call us back if anything further is needed.

## 2014-10-04 ENCOUNTER — Telehealth: Payer: Self-pay | Admitting: Neurology

## 2014-10-04 DIAGNOSIS — G20 Parkinson's disease: Secondary | ICD-10-CM

## 2014-10-04 MED ORDER — RASAGILINE MESYLATE 1 MG PO TABS: 1.0000 mg | ORAL_TABLET | Freq: Every morning | ORAL | Status: DC

## 2014-10-04 MED ORDER — CARBIDOPA-LEVODOPA 25-100 MG PO TABS: 1.5000 | ORAL_TABLET | Freq: Four times a day (QID) | ORAL | Status: DC

## 2014-10-04 NOTE — Telephone Encounter (Signed)
Patient called stating that she only received a 30 day supply for carbidopa-levodopa (SINEMET IR) 25-100 MG per tablet instead of 90 days. Please call and advice # [phone]

## 2014-10-04 NOTE — Telephone Encounter (Signed)
Patient called wanting to speak with DR. [name] regarding how she is feeling since she stopped staking th script for AZILECT 0.5MG  on 09/23/14. Patient states that she is not getting around as good as to when she was taking the script.  Patient can be reached @ [phone]

## 2014-10-04 NOTE — Telephone Encounter (Signed)
Duplicate message.  Rx was resent.  Please see previous note.

## 2014-10-04 NOTE — Telephone Encounter (Signed)
I spoke with patient who would prefer to wait for Dr [name] to verify if she needs to restart Azilect or if something else is recommended.

## 2014-10-04 NOTE — Telephone Encounter (Signed)
Patient called and stated that she would like for you to send the Rx. clopidogrel (PLAVIX) 75 MG tablet to [facility], [address]. Please call and advise.

## 2014-10-04 NOTE — Telephone Encounter (Signed)
Azliect added and sent to [facility] and Sinemet has been sent to mail order for 90 day supply per patient request.  I called the patient back to advise. Got no answer.

## 2014-10-04 NOTE — Telephone Encounter (Signed)
I called the patient back.  Says she d/c Azilect on 04/18 and has recently started to notice she is not able to get around as well as she used to.  I inquired if she had spoken with the [place] she has, and they told her she would qualify, but she has not called them back with the remaining info they requested to activate her enrollment.  Indicates she will call them back regarding this.  Asked that a message be sent to provider advising of her condition.    As well, she asked that the Sinemet Rx be resent to mail order for 90 days per fill rather than 30 days.  I have resent this Rx.

## 2014-10-04 NOTE — Telephone Encounter (Signed)
[name], thank you! It is funny, sometimes people don't realize that Azilect is really working until they stop it. Yes let's restart Azilect. i would like her to be on 1mg   Azilect in the morning. Would you order it please? Do you mind taking care of the Sinemet too? Thank you!

## 2014-10-04 NOTE — Telephone Encounter (Signed)
ERROR is was not for Rx. CLOPIDOGREL it was for Rx. AZILECT. Sorry!

## 2014-10-07 NOTE — Telephone Encounter (Signed)
Yes ma'am.  Patient is all set!  She will call us back if anything further is needed.

## 2014-10-07 NOTE — Telephone Encounter (Signed)
Thanks Janett Billow - did you get my previous note on this? Ok to order all of these thanks

## 2014-10-07 NOTE — Telephone Encounter (Signed)
This has been taken care of.  There were two message open.

## 2014-10-18 ENCOUNTER — Other Ambulatory Visit: Payer: Self-pay | Admitting: Cardiovascular Disease

## 2014-10-21 ENCOUNTER — Other Ambulatory Visit: Payer: Self-pay | Admitting: Cardiovascular Disease

## 2014-10-21 ENCOUNTER — Telehealth: Payer: Self-pay | Admitting: Neurology

## 2014-10-21 NOTE — Telephone Encounter (Signed)
Is she on 1mg  Azilect or 0.5mg  A

## 2014-10-21 NOTE — Telephone Encounter (Signed)
Patient is calling about her Rx carbidopa-levodopa 25-100 mg and states her mouth is dry and her speech slurred after taking.  She stated she is not sure whether the Rx azilect 1 mg tablet has any effect on this or not. Does she need a different dosage or different Rx.  Please call.

## 2014-10-21 NOTE — Telephone Encounter (Signed)
I spoke with patient.  Said since she began taking Azilect she has noticed her mouth is extremely dry, due to this, sometimes her speech seems slurred.  Indicates Sinemet also caused dry mouth, but now, it has worsened.  Questioning if she should reduce dose(s) or change med?  Please advise.  Thank you.

## 2014-10-21 NOTE — Telephone Encounter (Signed)
[name] - I think she just recently went up to 1mg  daily of the Azilect as she was on the .5mg  recently. I'm pretty sure, but if you wouldn;t mind finding out that would be great. If so, She can try going back down on the Azilect to 0.5mg . I can provide her with some samples if she likes.

## 2014-10-22 NOTE — Telephone Encounter (Signed)
I called back.  Spoke with patient.  Verified she is currently taking 1mg  daily.  She will try taking 0.5mg  instead and see if this helps. Indicates she still has meds remaining from original order (0.5mg  prescribed in Feb).  She will call us back if this is not beneficial, or if anything further is needed.

## 2014-10-23 ENCOUNTER — Ambulatory Visit (INDEPENDENT_AMBULATORY_CARE_PROVIDER_SITE_OTHER): Payer: Medicare Other

## 2014-10-23 DIAGNOSIS — J309 Allergic rhinitis, unspecified: Secondary | ICD-10-CM

## 2014-11-05 ENCOUNTER — Ambulatory Visit (INDEPENDENT_AMBULATORY_CARE_PROVIDER_SITE_OTHER): Payer: Medicare Other | Admitting: *Deleted

## 2014-11-05 DIAGNOSIS — Z5181 Encounter for therapeutic drug level monitoring: Secondary | ICD-10-CM

## 2014-11-05 DIAGNOSIS — I4891 Unspecified atrial fibrillation: Secondary | ICD-10-CM

## 2014-11-05 DIAGNOSIS — I480 Paroxysmal atrial fibrillation: Secondary | ICD-10-CM | POA: Diagnosis not present

## 2014-11-05 LAB — POCT INR: INR: 2.1

## 2014-12-02 ENCOUNTER — Other Ambulatory Visit: Payer: Self-pay

## 2014-12-17 ENCOUNTER — Ambulatory Visit (INDEPENDENT_AMBULATORY_CARE_PROVIDER_SITE_OTHER): Payer: Medicare Other

## 2014-12-17 DIAGNOSIS — Z5181 Encounter for therapeutic drug level monitoring: Secondary | ICD-10-CM | POA: Diagnosis not present

## 2014-12-17 DIAGNOSIS — I4891 Unspecified atrial fibrillation: Secondary | ICD-10-CM | POA: Diagnosis not present

## 2014-12-17 DIAGNOSIS — I480 Paroxysmal atrial fibrillation: Secondary | ICD-10-CM | POA: Diagnosis not present

## 2014-12-17 LAB — POCT INR: INR: 2

## 2014-12-23 ENCOUNTER — Ambulatory Visit (INDEPENDENT_AMBULATORY_CARE_PROVIDER_SITE_OTHER): Payer: Medicare Other | Admitting: Neurology

## 2014-12-23 ENCOUNTER — Encounter: Payer: Self-pay | Admitting: Neurology

## 2014-12-23 VITALS — BP 108/60 | HR 81 | Ht 65.0 in | Wt 207.2 lb

## 2014-12-23 DIAGNOSIS — G20 Parkinson's disease: Secondary | ICD-10-CM

## 2014-12-23 NOTE — Progress Notes (Addendum)
[facility]    Provider:  Dr [name] Referring Provider: [name], MD Primary Care Physician:  Simona Huh, MD  Primary Care Physician: Simona Huh, MD  CC: Parkinsonism  HPI: [name] is a 74 y.o. female here as a follow up. She is on Azilect .5mg  and Sinemet 1.5 4x a day. She walks with a cane at the house. She uses a walker to go out to eat. Going to church, she feels better with a walker. Not having any . She is hypophonic. No tremor. She sometimes acts out in her sleep, talks in her sleep, she twists around. Some drooling. Walking is slowing down, she is not getting exercise like she was. Constipation is bad. She wears depends. No difficulty swallowing but she has gerd. No hallucinations or visual disturbances. Hard to get going sometimes.    Interval History 09/23/2014: She hasn't noticed any difference with Azilect. Thinks it will be expensive. No falls. Using a cane more. More difficulty turning. Can get up herself. She doesn't use a cane in the house. Mopped the kitchen floor. Legs have been swelling since starting the Azilect, more than normal and more persistent than normal.   Interval History 07/25/2013: She feels great. At home she is not using the cane. She is having some dyspepsia. PT has been good. She is surprised how many people have PD, she went to the support group. No side effects of the Sinemet. She is doing more around the house, getting around so much better.  Interval history 05/15/2014: [name] is a 74 y.o. female here as a follow up. She was a previous patient of Dr. [name] and is transitioning care. She is getting weaker. She is on a new medication for afib. Problems walking, she is walking very slow, she has not fallen, can't turn, she freezes, she has difficulty getting out of a chair. She can't get her feet to move. She has pain in the feet ,in the toes. She has shuffling, doesn't pick up her feet. Has  constipation. Smell is ok. No tremor. Has stiffness. She has some vision changes. Has dizziness upon standing. No FHx of neurodegenerative disorders. Memory is good overall. Takes gabapentin for the pain in her feet but she can't tolerate a higher dose. Her voice is lower, her handwriting is smaller. No tremor. Symptoms started over a year ago and are progressively getting worse. 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 She has not fallen. She eats well. No history of dopamine-blocking agents.   11/16/2013 Dr. [name]: [name] is a 74 y.o. female here as a follow up with new onset dizziness. States having these episodes off and on for the past 3 months. Fluctuates in severity but has been constant. Notes feeling light headed, no sensation of vertigo or movement. Notes feeling off balance, unsteady on her feet. Uses a walker, needs a walker or wall to support herself. States these episodes came on acutely. Episodes are worse with standing but can occur sitting. Not precipitated or made worse by movement or head turning. Notes some increased muscle weakness. Continues to have lower back pain, is followed by neurosurgery, has received steroid injections in her hips.   EMG/NCS results: Nerve conduction studies done on both lower extremities shows no clear evidence of a generalized peripheral neuropathy. 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 On the right lower extremity, EMG evaluation reveals evidence of a mild primarily chronic L5 and S1 radiculopathy. No other significant abnormalities were seen.   Review of  Systems: Patient complains of symptoms per HPI as well as the following symptoms: Constipation, leg swelling. Pertinent negatives per HPI. All others negative.   History   Social History  . Marital Status: Married    Spouse Name: [name]  . Number of Children: 2  . Years of Education: 12th    Occupational History  . retired     Radio producer work   Social History Main Topics  . Smoking status: Never Smoker   . Smokeless tobacco: Never Used  . Alcohol Use: No     Comment: 02/01/2014 " glasseof wine once in a blue moon; < once/year"  . Drug Use: No  . Sexual Activity: No     Comment: hysterectomy   Other Topics Concern  . Not on file   Social History Narrative   Patient lives at home with husband [name].   Patient is retired.   Patient has a high school education.    Patient has 2 children     Family History  Problem Relation Age of Onset  . Arthritis Mother   . Heart attack Father   . Hypertension Sister   . Asthma Sister   . Allergies Sister     Past Medical History  Diagnosis Date  . CHF (congestive heart failure)   . PAF (paroxysmal atrial fibrillation)   . Hypertension   . Allergic rhinitis   . Hyperlipidemia   . Lumbar stenosis   . Scoliosis     with radiculopathy L2-S1 with prior surgery  . Small bowel obstruction     versus ileus after last bck surgery  . GERD (gastroesophageal reflux disease)   . OSA (obstructive sleep apnea)     "couldn't handle CPAP; use mouth guard some; not all the time" (01/06/2014)  . DJD (degenerative joint disease)   . Spondylosis   . Arthritis     "all over"  . Chronic back pain     Past Surgical History  Procedure Laterality Date  . Nasal septum surgery  80's  . Total abdominal hysterectomy  02/1993  . Lipoma excision  1980's    "fatty tumors"  . Lumbar disc surgery  02/2009    "ruptured disc"  . Repair dural / csf leak  02/2009  . Cataract extraction w/ intraocular lens  implant, bilateral  2011  . Posterior lumbar  fusion  06/2010; 10/2011    "placed screws, rods, spacers both times"  . Back surgery    . Colonoscopy  12/09/2004    Current Outpatient Prescriptions  Medication Sig Dispense Refill  . acetaminophen (TYLENOL) 500 MG tablet Take 1,000 mg by mouth daily as needed (pain).    . bimatoprost (LUMIGAN) 0.01 % SOLN Place 1 drop into both eyes at bedtime.    . calcium carbonate (TUMS EX) 750 MG chewable tablet Chew 2 tablets by mouth at bedtime.    . carbidopa-levodopa (SINEMET IR) 25-100 MG per tablet Take 1.5 tablets by mouth 4 (four) times daily. May take an extra pill in the evenings if needed 630 tablet 1  . Cinnamon 500 MG capsule Take 1,000 mg by mouth at bedtime.     Marland Kitchen  EPINEPHrine (EPIPEN 2-PAK) 0.3 mg/0.3 mL IJ SOAJ injection Inject 0.3 mg into the muscle as needed (allergic reaction). Inject into the thigh    . fexofenadine (ALLEGRA) 180 MG tablet Take 180 mg by mouth daily. For allergies    . flecainide (TAMBOCOR) 50 MG tablet TAKE 1 BY MOUTH TWICE DAILY 180 tablet 0  . furosemide (LASIX) 20 MG tablet Take 20 mg by mouth daily.     Marland Kitchen gabapentin (NEURONTIN) 300 MG capsule Take 300 mg by mouth 2 (two) times daily.    Marland Kitchen KRILL OIL PO Take 350 mg by mouth daily.     . Linaclotide (LINZESS) 145 MCG CAPS capsule Take 145 mcg by mouth daily as needed (constipation).    . Magnesium 400 MG TABS Take 400 mg by mouth at bedtime.    . metolazone (ZAROXOLYN) 5 MG tablet 1 tab every other day    . metoprolol succinate (TOPROL-XL) 100 MG 24 hr tablet TAKE 1 BY MOUTH DAILY WITH OR IMMEDIATELY FOLLOWING A MEAL 90 tablet 0  . Polyvinyl Alcohol-Povidone (REFRESH OP) Place 1 drop into both eyes 2 (two) times daily.    . potassium chloride SA (K-DUR,KLOR-CON) 20 MEQ tablet 1 tab every  day    . pramipexole (MIRAPEX) 0.5 MG tablet Take 0.5 mg by mouth at bedtime. For restless legs    . pravastatin (PRAVACHOL) 40 MG tablet Take 40 mg by mouth at bedtime.     Marland Kitchen PRESCRIPTION MEDICATION Inject 1 application into the  skin once a week. On Tuesdays (allergy shot)    . rasagiline (AZILECT) 1 MG TABS tablet Take 1 tablet (1 mg total) by mouth every morning. 30 tablet 3  . warfarin (COUMADIN) 5 MG tablet Take as directed by coumadin clinic 60 tablet 1   No current facility-administered medications for this visit.    Allergies as of 12/23/2014 - Review Complete 12/23/2014  Allergen Reaction Noted  . Lyrica [pregabalin] Other (See Comments) 01/31/2014  . Zocor [simvastatin] Other (See Comments) 04/17/2012  . Relafen [nabumetone] Rash 04/17/2012    Vitals: BP 108/60 mmHg  Pulse 81  Ht 5\' 5"  (1.651 m)  Wt 207 lb 3.2 oz (93.985 kg)  BMI 34.48 kg/m2 Last Weight:  Wt Readings from Last 1 Encounters:  12/23/14 207 lb 3.2 oz (93.985 kg)   Last Height:   Ht Readings from Last 1 Encounters:  12/23/14 5\' 5"  (1.651 m)    Neuro: Detailed Neurologic Exam  Speech:Mild hypophonia. No aphasia or dysarthria.  Cognition:  The patient is oriented to person, place, and time;   Cranial Nerves: Masked Facies  The pupils are equal, round, and reactive to light. Visual fields are full to finger confrontation. Extraocular movements are intact. Trigeminal sensation is intact and the muscles of mastication are normal. The face is symmetric. The palate elevates in the midline. Voice is normal. Shoulder shrug is normal. The tongue has normal motion without fasciculations.   Coordination:  finger to nose intact but slow, cannot perform HTS due to body habitus. Good finger tapping.   Gait:  Shuffling, slow. Unable to assess arm swing, she uses a cane. Stooped.   Motor Observation: Masked Facies  no involuntary movements noted. Tone:  Mild Increased tone uppers with facilitation  Posture:  stooped   Strength:  Left DF 3+/5, otherwise Strength is V/V in the upper and lower limbs.    Assessment/Plan: 74 year old female here as a follow up. She was a former patient of Dr. [name]. Exam  shows shuffling gait with stooped posture, masked facies, hypophonia and hypographia, possible cogwheeling in the upper extremities, masked facies. No tremor. No history of Dopa blocking drugs. 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  1) Carbidopa/Levodopa(Sinemet) 25/100mg . 1.5 tab 4 times a day, responded well. Side effects possibly from Azilect 1mg . Will decrease to 0.5mg ..  2) There is an excellent Parkinson's support group in [place]. It is called "[facility]" and meets once a month. Please call Amy Marriot or Vianne Bulls at [phone] for more information. There is no charge/fee for joining the group.  3) Can try Neupro patch and see if this works better for her instead of Sinemet multiple times a day.    Sarina Ill, MD  [facility] [address] [place], [place] [address]  Phone [phone] Fax [phone]  A total of 25 minutes was spent face-to-face with this patient. Over half this time was spent on counseling patient on the parkinsonism diagnosis and different diagnostic and therapeutic options available.

## 2014-12-23 NOTE — Patient Instructions (Signed)
Overall you are doing fairly well but I do want to suggest a few things today:   Remember to drink plenty of fluid, eat healthy meals and do not skip any meals. Try to eat protein with a every meal and eat a healthy snack such as fruit or nuts in between meals. Try to keep a regular sleep-wake schedule and try to exercise daily, particularly in the form of walking, 20-30 minutes a day, if you can.   As far as your medications are concerned, I would like to suggest:  Neupro patch 2mg /24hr one patch a day After one week increase to 4mg /24hr patch  I would like to see you back in 6 months, sooner if we need to. Please call us with any interim questions, concerns, problems, updates or refill requests.   Please also call us for any test results so we can go over those with you on the phone.  My clinical assistant and will answer any of your questions and relay your messages to me and also relay most of my messages to you.   Our phone number is [phone]. We also have an after hours call service for urgent matters and there is a physician on-call for urgent questions. For any emergencies you know to call 911 or go to the nearest emergency room

## 2014-12-24 ENCOUNTER — Telehealth: Payer: Self-pay | Admitting: Neurology

## 2014-12-24 NOTE — Telephone Encounter (Signed)
[name] - would you mind calling patient's insurance or her pharmacy and see if insurance would pay for Neupro patch? 2mg  or 4mg  patch?

## 2014-12-25 NOTE — Telephone Encounter (Signed)
Ulice Dash with [employer] called to give outcome to tier exception for Neupro 4mg . It was approved from tier 4 to tier 3 . Approval is good for 1 yr starting today.If any questions please call [phone].

## 2014-12-25 NOTE — Telephone Encounter (Signed)
Ins has approved the request for coverage on Neupro, and approved a tier reduction from tier 4 to tier 3.  I called the patient.  She is aware.  Said she will use the samples and call us back when she needs a Rx.

## 2014-12-25 NOTE — Telephone Encounter (Signed)
Thank you :)

## 2014-12-25 NOTE — Telephone Encounter (Addendum)
I tried to call ins, however, the message said they were closed at this time.  Will try again.

## 2014-12-25 NOTE — Telephone Encounter (Signed)
I called again.  Spoke with [employer].  She could not give me a definitive answer, indicated the patient was provided with a handbook of medications that are and are not covered.  Stated we can submit a PA request for them to review, so I have done so.  Ref # [id number].  They will contact us and the patient once the outcome of this review has been determined.

## 2015-01-02 ENCOUNTER — Telehealth: Payer: Self-pay | Admitting: Neurology

## 2015-01-02 NOTE — Telephone Encounter (Signed)
Please call patient regarding new medication (patch)  she was placed on last week, patient doesn't feel that she is doing as well.

## 2015-01-02 NOTE — Telephone Encounter (Signed)
Spoke w/ pt and she stated she does not think the nuepro patch is helping. She has been taking this instead of the sinemet. She is very tired and has gone back to using a walker. She wants to know what other options there are. I told her I will let Dr. [name] know and we will call her back tonight or tomorrow. She verbalized understanding.

## 2015-01-03 NOTE — Telephone Encounter (Signed)
Patient did not respond to the nepro patch, said she went up on the patch and started using her walker. Asked her to restart the sinemet, seems that works best for her. thanks

## 2015-01-07 ENCOUNTER — Encounter: Payer: Self-pay | Admitting: Cardiovascular Disease

## 2015-01-28 ENCOUNTER — Ambulatory Visit (INDEPENDENT_AMBULATORY_CARE_PROVIDER_SITE_OTHER): Payer: Medicare Other | Admitting: *Deleted

## 2015-01-28 DIAGNOSIS — Z5181 Encounter for therapeutic drug level monitoring: Secondary | ICD-10-CM | POA: Diagnosis not present

## 2015-01-28 DIAGNOSIS — I480 Paroxysmal atrial fibrillation: Secondary | ICD-10-CM | POA: Diagnosis not present

## 2015-01-28 DIAGNOSIS — I4891 Unspecified atrial fibrillation: Secondary | ICD-10-CM | POA: Diagnosis not present

## 2015-01-28 LAB — POCT INR: INR: 1.9

## 2015-01-31 ENCOUNTER — Ambulatory Visit (INDEPENDENT_AMBULATORY_CARE_PROVIDER_SITE_OTHER): Payer: Medicare Other

## 2015-01-31 ENCOUNTER — Ambulatory Visit (INDEPENDENT_AMBULATORY_CARE_PROVIDER_SITE_OTHER): Payer: Medicare Other | Admitting: Podiatry

## 2015-01-31 ENCOUNTER — Encounter: Payer: Self-pay | Admitting: Podiatry

## 2015-01-31 VITALS — BP 115/50 | HR 75 | Temp 97.2°F | Resp 14

## 2015-01-31 DIAGNOSIS — M7740 Metatarsalgia, unspecified foot: Secondary | ICD-10-CM

## 2015-01-31 DIAGNOSIS — R262 Difficulty in walking, not elsewhere classified: Secondary | ICD-10-CM

## 2015-01-31 DIAGNOSIS — M79673 Pain in unspecified foot: Secondary | ICD-10-CM

## 2015-01-31 DIAGNOSIS — R261 Paralytic gait: Secondary | ICD-10-CM

## 2015-01-31 DIAGNOSIS — R2681 Unsteadiness on feet: Secondary | ICD-10-CM | POA: Diagnosis not present

## 2015-01-31 DIAGNOSIS — R2689 Other abnormalities of gait and mobility: Secondary | ICD-10-CM

## 2015-01-31 DIAGNOSIS — M21379 Foot drop, unspecified foot: Secondary | ICD-10-CM

## 2015-01-31 NOTE — Progress Notes (Signed)
   Subjective:    Patient ID: [name], female    DOB: 1940/08/01, 74 y.o.   MRN: [id number]  HPI 74 year old female presents the office with complaints of pain in the ball of her foot with a left-sided worse in the right which is been ongoing for approximately 3 years. She denies any swelling or redness. No tingling or numbness. She denies any history of injury or trauma. She states that she feels unsteady with her feet with ambulation as well. This has been ongoing. She does have a history of Parkinson's.She said no prior treatment. No other complaints at this time.   Review of Systems  HENT:       SINUS PROBLEMS  Hematological: Bruises/bleeds easily.       Objective:   Physical Exam AAO 3, NAD DP/PT pulses palpable, CRT less than 3 seconds There is no areas of pinpoint bony tenderness or pain the vibratory sensation of bilateral lower extremities. There is a decrease in dorsiflexion the left ankle. Subjective she states she drags her feet when she walks. There is mild diffuse tenderness along the plantar aspect of the metatarsal heads bilaterally. There is no pain with MPJ range of motion there is no pain of the digits. There is no overlying edema, erythema, increase in warmth. MMT 3/5 in Dorsiflexion on the left, otherwise 5/5. ROM WNL No open lesions or pre-ulcerative lesions. 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  -I do believe  that she would benefit from an AFO due to her dropfoot. Given her dropfoot she is a more pressure the metatarsal heads.also she feels unsteady and she does have Parkinson's. She may benefit from a Moore balanced brace. I will have her follow up with [facility] for evaluation and see what brace would be optimal for  her. In the meantime I encouraged her to call the office with any questions, concerns, change in symptoms.  Celesta Gentile, DPM

## 2015-02-06 ENCOUNTER — Other Ambulatory Visit: Payer: Self-pay | Admitting: Cardiovascular Disease

## 2015-02-07 ENCOUNTER — Encounter: Payer: Self-pay | Admitting: Podiatry

## 2015-02-12 ENCOUNTER — Telehealth: Payer: Self-pay | Admitting: Internal Medicine

## 2015-02-12 ENCOUNTER — Ambulatory Visit (INDEPENDENT_AMBULATORY_CARE_PROVIDER_SITE_OTHER): Payer: Medicare Other

## 2015-02-12 ENCOUNTER — Ambulatory Visit: Payer: Medicare Other | Admitting: *Deleted

## 2015-02-12 DIAGNOSIS — J309 Allergic rhinitis, unspecified: Secondary | ICD-10-CM

## 2015-02-12 DIAGNOSIS — R2689 Other abnormalities of gait and mobility: Principal | ICD-10-CM

## 2015-02-12 DIAGNOSIS — M21379 Foot drop, unspecified foot: Secondary | ICD-10-CM

## 2015-02-12 NOTE — Telephone Encounter (Signed)
Date Mixed: 02/12/15 Vial: 2 Strength: 1:10 Here/Mail/Pick Up: mail Mixed By: Laurette Schimke

## 2015-02-12 NOTE — Progress Notes (Signed)
Patient ID: [name], female   DOB: February 09, 1941, 74 y.o.   MRN: [id number] Patient presents for brace casting with [facility]

## 2015-02-14 ENCOUNTER — Ambulatory Visit (INDEPENDENT_AMBULATORY_CARE_PROVIDER_SITE_OTHER): Payer: Medicare Other | Admitting: Pharmacist

## 2015-02-14 DIAGNOSIS — Z5181 Encounter for therapeutic drug level monitoring: Secondary | ICD-10-CM

## 2015-02-14 DIAGNOSIS — I480 Paroxysmal atrial fibrillation: Secondary | ICD-10-CM | POA: Diagnosis not present

## 2015-02-14 DIAGNOSIS — I4891 Unspecified atrial fibrillation: Secondary | ICD-10-CM | POA: Diagnosis not present

## 2015-02-14 LAB — POCT INR: INR: 3.5

## 2015-02-21 ENCOUNTER — Other Ambulatory Visit: Payer: Self-pay | Admitting: Cardiovascular Disease

## 2015-02-28 ENCOUNTER — Ambulatory Visit (INDEPENDENT_AMBULATORY_CARE_PROVIDER_SITE_OTHER): Payer: Medicare Other | Admitting: *Deleted

## 2015-02-28 DIAGNOSIS — Z5181 Encounter for therapeutic drug level monitoring: Secondary | ICD-10-CM

## 2015-02-28 DIAGNOSIS — I480 Paroxysmal atrial fibrillation: Secondary | ICD-10-CM

## 2015-02-28 DIAGNOSIS — I4891 Unspecified atrial fibrillation: Secondary | ICD-10-CM

## 2015-02-28 LAB — POCT INR: INR: 2.1

## 2015-03-10 ENCOUNTER — Encounter: Payer: Self-pay | Admitting: Internal Medicine

## 2015-03-10 ENCOUNTER — Other Ambulatory Visit: Payer: Self-pay | Admitting: *Deleted

## 2015-03-10 MED ORDER — FLECAINIDE ACETATE 50 MG PO TABS: ORAL_TABLET | ORAL | Status: DC

## 2015-03-19 ENCOUNTER — Ambulatory Visit: Payer: Medicare Other | Admitting: *Deleted

## 2015-03-19 DIAGNOSIS — M21372 Foot drop, left foot: Secondary | ICD-10-CM

## 2015-03-19 DIAGNOSIS — R262 Difficulty in walking, not elsewhere classified: Secondary | ICD-10-CM

## 2015-03-19 DIAGNOSIS — R2681 Unsteadiness on feet: Secondary | ICD-10-CM

## 2015-03-19 NOTE — Progress Notes (Signed)
Patient ID: [name], female   DOB: [birth year], 74 y.o.   MRN: [id number] Patient presents for brace fitting with [facility]

## 2015-03-21 ENCOUNTER — Other Ambulatory Visit: Payer: Self-pay | Admitting: Neurology

## 2015-03-21 ENCOUNTER — Ambulatory Visit (INDEPENDENT_AMBULATORY_CARE_PROVIDER_SITE_OTHER): Payer: Medicare Other | Admitting: *Deleted

## 2015-03-21 DIAGNOSIS — I480 Paroxysmal atrial fibrillation: Secondary | ICD-10-CM

## 2015-03-21 DIAGNOSIS — Z5181 Encounter for therapeutic drug level monitoring: Secondary | ICD-10-CM | POA: Diagnosis not present

## 2015-03-21 DIAGNOSIS — I4891 Unspecified atrial fibrillation: Secondary | ICD-10-CM | POA: Diagnosis not present

## 2015-03-21 LAB — POCT INR: INR: 2.3

## 2015-03-24 ENCOUNTER — Encounter: Payer: Self-pay | Admitting: Cardiology

## 2015-03-24 ENCOUNTER — Ambulatory Visit (INDEPENDENT_AMBULATORY_CARE_PROVIDER_SITE_OTHER): Payer: Medicare Other | Admitting: Cardiology

## 2015-03-24 VITALS — BP 150/83 | HR 69 | Ht 65.0 in | Wt 212.0 lb

## 2015-03-24 DIAGNOSIS — E785 Hyperlipidemia, unspecified: Secondary | ICD-10-CM

## 2015-03-24 DIAGNOSIS — Z7901 Long term (current) use of anticoagulants: Secondary | ICD-10-CM

## 2015-03-24 DIAGNOSIS — I480 Paroxysmal atrial fibrillation: Secondary | ICD-10-CM

## 2015-03-24 DIAGNOSIS — I10 Essential (primary) hypertension: Secondary | ICD-10-CM | POA: Diagnosis not present

## 2015-03-24 NOTE — Progress Notes (Signed)
Cardiology Office Note   Date:  03/24/2015   ID:  [name], DOB [birth year], MRN [id number]  PCP:  [name] Huh, MD  Cardiologist:  Dr. Johnsie [name]    Chief Complaint  Patient presents with  . Atrial Fibrillation    paraxsymal, none pt is aware of      History of Present Illness: [name] is a 74 y.o. female who presents for follow up of HTN and PAF.  She is on coumadin followed by our office.  She has hx of a heart catheterization in Feb. 2002 that demonstrated normal coronary arteries and normal LV function. A Myoview study done in July 2009 demonstrated no ischemia. An echocardiogram done in July 2012 demonstrated normal systolic function with an EF of 55-60%.   Echo 2015 revealing normal LV function with an EF of 60-65%. Wall motion was normal; there were no regional wall motion abnormalities. The left atrium was normal in size.    Myovue 9/15 normal EF no ischemia  Flecainide added 04/03/14.  Is followed by Neuro for parkinson's.  Has been fitted for a brace on lt leg.  Uses a walker to ambulate.  No chest pain, no SOB.  No awareness of palpitations.  Her husband also followed by Dr. Johnsie [name] for heart transplant is with pt today.  Pt's wt is up, not exercising, and she admits to eating fast foods, discussed dietary changes.     Past Medical History  Diagnosis Date  . CHF (congestive heart failure) ([facility])   . PAF (paroxysmal atrial fibrillation) ([place])   . Hypertension   . Allergic rhinitis   . Hyperlipidemia   . Lumbar stenosis   . Scoliosis     with radiculopathy L2-S1 with prior surgery  . Small bowel obstruction ([facility])     versus ileus after last bck surgery  . GERD (gastroesophageal reflux disease)   . OSA (obstructive sleep apnea)     "couldn't handle CPAP; use mouth guard some; not all the time" (01/06/2014)  . DJD (degenerative joint disease)   . Spondylosis   . Arthritis     "all over"  . Chronic back pain   . Parkinson's disease [name])      Past Surgical History  Procedure Laterality Date  . Nasal septum surgery  80's  . Total abdominal hysterectomy  02/1993  . Lipoma excision  1980's    "fatty tumors"  . Lumbar disc surgery  02/2009    "ruptured disc"  . Repair dural / csf leak  02/2009  . Cataract extraction w/ intraocular lens  implant, bilateral  2011  . Posterior lumbar fusion  06/2010; 10/2011    "placed screws, rods, spacers both times"  . Back surgery    . Colonoscopy  12/09/2004     Current Outpatient Prescriptions  Medication Sig Dispense Refill  . acetaminophen (TYLENOL) 500 MG tablet Take 1,000 mg by mouth daily as needed (pain).    . calcium carbonate (TUMS EX) 750 MG chewable tablet Chew 2 tablets by mouth at bedtime.    . carbidopa-levodopa (SINEMET IR) 25-100 MG tablet TAKE 1 & 1/2 TABLETS BY MOUTH 4 TIMES A DAY, MAY TAKE AN EXTRA PILL IN THE EVENINGS IF NEEDED 630 tablet 1  . Cinnamon 500 MG capsule Take 1,000 mg by mouth at bedtime.     Marland Kitchen EPINEPHrine (EPIPEN 2-PAK) 0.3 mg/0.3 mL IJ SOAJ injection Inject 0.3 mg into the muscle as needed (allergic reaction). Inject into the thigh    .  fexofenadine (ALLEGRA) 180 MG tablet Take 180 mg by mouth daily. For allergies    . flecainide (TAMBOCOR) 50 MG tablet TAKE 1 BY MOUTH TWICE DAILY CONTACT YOUR PROVIDER FOR AN APPOINTMENT 180 tablet 0  . furosemide (LASIX) 20 MG tablet Take 20 mg by mouth daily.     Marland Kitchen gabapentin (NEURONTIN) 300 MG capsule Take 300 mg by mouth 2 (two) times daily.    . Linaclotide (LINZESS) 145 MCG CAPS capsule Take 145 mcg by mouth daily as needed (constipation).    . Magnesium 400 MG TABS Take 400 mg by mouth at bedtime.    . metolazone (ZAROXOLYN) 5 MG tablet 1 tab every other day    . metoprolol succinate (TOPROL-XL) 100 MG 24 hr tablet TAKE 1 BY MOUTH DAILY WITH OR IMMEDIATELY FOLLOWING A MEAL (CONTACT YOUR PROVIDER FOR AN APPOINTMENT) 90 tablet 0  . NONFORMULARY OR COMPOUNDED ITEM Allergy Vaccine 1:10 Given at Home    . potassium  chloride SA (K-DUR,KLOR-CON) 20 MEQ tablet 1 tab every  day    . pramipexole (MIRAPEX) 0.5 MG tablet Take 0.5 mg by mouth at bedtime. For restless legs    . pravastatin (PRAVACHOL) 40 MG tablet Take 40 mg by mouth at bedtime.     Marland Kitchen PRESCRIPTION MEDICATION Inject 1 application into the skin once a week. On Tuesdays (allergy shot)    . rasagiline (AZILECT) 1 MG TABS tablet Take 1 tablet (1 mg total) by mouth every morning. 30 tablet 3  . TRAVATAN Z 0.004 % SOLN ophthalmic solution   0  . TRILYTE 420 G solution Take 4,000 mLs by mouth once.   0  . warfarin (COUMADIN) 5 MG tablet Take as directed by coumadin clinic 60 tablet 1   No current facility-administered medications for this visit.    Allergies:   Lyrica; Zocor; and Relafen    Social History:  The patient  reports that she has never smoked. She has never used smokeless tobacco. She reports that she does not drink alcohol or use illicit drugs.   Family History:  The patient's family history includes Allergies in her sister; Arthritis in her mother; Asthma in her sister; Heart attack in her father; Hypertension in her sister.    ROS:  General:no colds or fevers, incr. of weight - not exercising Skin:no rashes or ulcers HEENT:no blurred vision, no congestion CV:see HPI PUL:see HPI GI:no diarrhea constipation or melena, no indigestion GU:no hematuria, no dysuria- for routine colonoscopy next week MS:no joint pain, no claudication- getting a brace for Lt leg, using a walker Neuro:no syncope, no lightheadedness Endo:no diabetes, no thyroid disease  Wt Readings from Last 3 Encounters:  03/24/15 212 lb (96.163 kg)  12/23/14 207 lb 3.2 oz (93.985 kg)  09/23/14 208 lb (94.348 kg)     PHYSICAL EXAM: VS:  BP 150/83 mmHg  Pulse 69  Ht 5\' 5"  (1.651 m)  Wt 212 lb (96.163 kg)  BMI 35.28 kg/m2 , BMI Body mass index is 35.28 kg/(m^2). General:Pleasant affect, NAD Skin:Warm and dry, brisk capillary refill HEENT:normocephalic, sclera  clear, mucus membranes moist Neck:supple, no JVD, no bruits  Heart:S1S2 RRR without murmur, gallup, rub or click Lungs:clear without rales, rhonchi, or wheezes TIR:WERX, non tender, + BS, do not palpate liver spleen or masses Ext:no lower ext edema, 2+ pedal pulses, 2+ radial pulses Neuro:alert and oriented X 3, MAE, follows commands, + facial symmetry    EKG:  EKG is ordered today. The ekg ordered today demonstrates SR with PAC  poor R wave progression , no acute changes otherwise.    Recent Labs: No results found for requested labs within last 365 days.    Lipid Panel    Component Value Date/Time   CHOL  12/22/2006 0550    183        ATP III CLASSIFICATION:  <200     mg/dL   Desirable  200-239  mg/dL   Borderline High  >=240    mg/dL   High   TRIG 174* 12/22/2006 0550   HDL 47 12/22/2006 0550   CHOLHDL 3.9 12/22/2006 0550   VLDL 35 12/22/2006 0550   LDLCALC * 12/22/2006 0550    101        Total Cholesterol/HDL:CHD Risk Coronary Heart Disease Risk Table                     Men   Women  1/2 Average Risk   3.4   3.3       Other studies Reviewed: Additional studies/ records that were reviewed today include: previous notes and labs.   ASSESSMENT AND PLAN:  1.  PAF- maintaining SR.  On flecainide.  Anticoagulation on coumadin, with INR 2.3 last week.  She will follow up with Dr. Johnsie [name] in 6 months.   2. HTN  BP elevated today, but she has not taken diuretic, previous BPs have been low.  She will resume diuretic, held due to being out of home for appts.  We will have her BP rechecked with next coumadin check.   3.  Hyperlipidemia checked with PCP in August with T chol 150, LDL 70, HDL 56.  She will continue with pravachol.  For colonoscopy next week.  Was instructed to continue coumadin by GI.      Current medicines are reviewed with the patient today.  The patient Has no concerns regarding medicines.  The following changes have been made:  See above Labs/ tests  ordered today include:see above  Disposition:   FU:  see above  Signed, [name] Serge, NP  03/24/2015 10:08 AM    [facility] [place], [place], [place], [place] Phone: [phone]; Fax: [phone]

## 2015-03-24 NOTE — Patient Instructions (Signed)
Medication Instructions:  None  Labwork: None  Testing/Procedures: None  Follow-Up: Your physician recommends that you schedule a follow-up appointment with Hypertension clinic at the same time as your next Coumadin appointment (04/18/15 @ 10:15AM).  Your physician wants you to follow-up in: 6 months with [name]. You will receive a reminder letter in the mail two months in advance. If you don't receive a letter, please call our office to schedule the follow-up appointment.    Any Other Special Instructions Will Be Listed Below (If Applicable).

## 2015-04-18 ENCOUNTER — Ambulatory Visit (INDEPENDENT_AMBULATORY_CARE_PROVIDER_SITE_OTHER): Payer: Medicare Other | Admitting: Pharmacist

## 2015-04-18 ENCOUNTER — Encounter: Payer: Self-pay | Admitting: Pharmacist

## 2015-04-18 VITALS — BP 118/72 | HR 79

## 2015-04-18 DIAGNOSIS — I480 Paroxysmal atrial fibrillation: Secondary | ICD-10-CM | POA: Diagnosis not present

## 2015-04-18 DIAGNOSIS — Z01810 Encounter for preprocedural cardiovascular examination: Secondary | ICD-10-CM | POA: Insufficient documentation

## 2015-04-18 DIAGNOSIS — Z5181 Encounter for therapeutic drug level monitoring: Secondary | ICD-10-CM

## 2015-04-18 DIAGNOSIS — I4891 Unspecified atrial fibrillation: Secondary | ICD-10-CM | POA: Diagnosis not present

## 2015-04-18 DIAGNOSIS — Z79899 Other long term (current) drug therapy: Secondary | ICD-10-CM

## 2015-04-18 LAB — POCT INR: INR: 1.9

## 2015-04-18 NOTE — Progress Notes (Signed)
Patient ID: [name]                 DOB: [birth year], 74 yo                         MRN: [id number]     HPI: TENEA STAY is a 74 y.o. female referred to HTN clinic by Cecilie Kicks. PMH is significant for afib, HF, and Parkinson's. Cecilie Kicks saw pt a month ago and BP was higher than usual at 150/50mmHg.  Pt reported that she had not taken either of her diuretics that morning. BPs usually at goal and pt does not have HTN listed on her PMH. BP has always been controlled in clinic. Pt presents today for BP f/u after elevated reading. She reports that she has been taking her diuretics again.  Current HTN meds: furosemide 20 daily, metolazone 5mg  every other day, Toprol 100mg  daily BP goal: <150/68mmHg. BP in clinic today 118/72, pulse 79  Family History: Heart attack in her father and HTN in her sister.  Social History: Patient reports that she has never smoked. She reports that she does not drink alcohol or use illicit drugs.  Home BP readings: Patient occasionally checks her BP at home and reports that readings tend to run 120s/60s.She denies symptoms of dizziness or lightheadedness.  Wt Readings from Last 3 Encounters:  03/24/15 212 lb (96.163 kg)  12/23/14 207 lb 3.2 oz (93.985 kg)  09/23/14 208 lb (94.348 kg)   BP Readings from Last 3 Encounters:  04/18/15 118/72  03/24/15 150/83  01/31/15 115/50   Pulse Readings from Last 3 Encounters:  04/18/15 79  03/24/15 69  01/31/15 75    Renal function: CrCl cannot be calculated (Unknown ideal weight.).  Past Medical History  Diagnosis Date  . CHF (congestive heart failure) ([facility])   . PAF (paroxysmal atrial fibrillation) ([place])   . Hypertension   . Allergic rhinitis   . Hyperlipidemia   . Lumbar stenosis   . Scoliosis     with radiculopathy L2-S1 with prior surgery  . Small bowel obstruction ([facility])     versus ileus after last bck surgery  . GERD (gastroesophageal reflux disease)   . OSA (obstructive sleep  apnea)     "couldn't handle CPAP; use mouth guard some; not all the time" (01/06/2014)  . DJD (degenerative joint disease)   . Spondylosis   . Arthritis     "all over"  . Chronic back pain   . Parkinson's disease [facility])     Current Outpatient Prescriptions on File Prior to Visit  Medication Sig Dispense Refill  . acetaminophen (TYLENOL) 500 MG tablet Take 1,000 mg by mouth daily as needed (pain).    . calcium carbonate (TUMS EX) 750 MG chewable tablet Chew 2 tablets by mouth at bedtime.    . carbidopa-levodopa (SINEMET IR) 25-100 MG tablet TAKE 1 & 1/2 TABLETS BY MOUTH 4 TIMES A DAY, MAY TAKE AN EXTRA PILL IN THE EVENINGS IF NEEDED 630 tablet 1  . EPINEPHrine (EPIPEN 2-PAK) 0.3 mg/0.3 mL IJ SOAJ injection Inject 0.3 mg into the muscle as needed (allergic reaction). Inject into the thigh    . fexofenadine (ALLEGRA) 180 MG tablet Take 180 mg by mouth daily. For allergies    . flecainide (TAMBOCOR) 50 MG tablet TAKE 1 BY MOUTH TWICE DAILY CONTACT YOUR PROVIDER FOR AN APPOINTMENT 180 tablet 0  . furosemide (LASIX) 20 MG tablet Take 20 mg by  mouth daily.     Marland Kitchen gabapentin (NEURONTIN) 300 MG capsule Take 300 mg by mouth 2 (two) times daily.    . Linaclotide (LINZESS) 145 MCG CAPS capsule Take 145 mcg by mouth daily as needed (constipation).    . Magnesium 400 MG TABS Take 400 mg by mouth at bedtime.    . metolazone (ZAROXOLYN) 5 MG tablet 1 tab every other day    . metoprolol succinate (TOPROL-XL) 100 MG 24 hr tablet TAKE 1 BY MOUTH DAILY WITH OR IMMEDIATELY FOLLOWING A MEAL (CONTACT YOUR PROVIDER FOR AN APPOINTMENT) 90 tablet 0  . NONFORMULARY OR COMPOUNDED ITEM Allergy Vaccine 1:10 Given at Home    . potassium chloride SA (K-DUR,KLOR-CON) 20 MEQ tablet 1 tab every  day    . pramipexole (MIRAPEX) 0.5 MG tablet Take 0.5 mg by mouth at bedtime. For restless legs    . pravastatin (PRAVACHOL) 40 MG tablet Take 40 mg by mouth at bedtime.     Marland Kitchen PRESCRIPTION MEDICATION Inject 1 application into the  skin once a week. On Tuesdays (allergy shot)    . rasagiline (AZILECT) 1 MG TABS tablet Take 1 tablet (1 mg total) by mouth every morning. (Patient taking differently: Take 0.5 mg by mouth every morning. ) 30 tablet 3  . TRAVATAN Z 0.004 % SOLN ophthalmic solution   0  . warfarin (COUMADIN) 5 MG tablet Take as directed by coumadin clinic 60 tablet 1   No current facility-administered medications on file prior to visit.    Allergies  Allergen Reactions  . Lyrica [Pregabalin] Other (See Comments)    Felt loopy  . Zocor [Simvastatin] Other (See Comments)    Myalgias  . Relafen [Nabumetone] Rash     Assessment/Plan:  1. Blood pressure - patient remains at goal BP <150/6mmHg since she has restarted taking her diuretics again. She has never had a problem with high blood pressure and reports home readings consistently 120s/60s. Patient will continue on current regimen and continue to monitor her BP at home. Pt does not need to be seen again in HTN clinic given consistent BP readings at goal.    Jenai Scaletta E. Adon Gehlhausen, PharmD [place] [id number], [place], [place] [address] Phone: [phone]; Fax: [phone] 04/18/2015 11:22 AM

## 2015-04-21 ENCOUNTER — Ambulatory Visit: Payer: Medicare Other | Admitting: Internal Medicine

## 2015-04-22 ENCOUNTER — Encounter: Payer: Self-pay | Admitting: Internal Medicine

## 2015-04-22 ENCOUNTER — Ambulatory Visit (INDEPENDENT_AMBULATORY_CARE_PROVIDER_SITE_OTHER): Payer: Medicare Other | Admitting: Internal Medicine

## 2015-04-22 VITALS — BP 130/68 | HR 70 | Ht 65.0 in | Wt 207.8 lb

## 2015-04-22 DIAGNOSIS — J302 Other seasonal allergic rhinitis: Secondary | ICD-10-CM

## 2015-04-22 DIAGNOSIS — J309 Allergic rhinitis, unspecified: Secondary | ICD-10-CM

## 2015-04-22 DIAGNOSIS — G4733 Obstructive sleep apnea (adult) (pediatric): Secondary | ICD-10-CM

## 2015-04-22 DIAGNOSIS — J3089 Other allergic rhinitis: Secondary | ICD-10-CM

## 2015-04-22 DIAGNOSIS — I4891 Unspecified atrial fibrillation: Secondary | ICD-10-CM

## 2015-04-22 DIAGNOSIS — G20 Parkinson's disease: Secondary | ICD-10-CM

## 2015-04-22 NOTE — Assessment & Plan Note (Signed)
Heart rate nearly regular on today's exam indicating good control of ventricular response rate. She continues warfarin

## 2015-04-22 NOTE — Assessment & Plan Note (Signed)
Now needing rolling walker as balance and mobility decline

## 2015-04-22 NOTE — Progress Notes (Signed)
Patient ID: [name], female    DOB: 21-Oct-1940, 74 y.o.   MRN: [id number]  HPI 74 yoF followed for allergic rhinitis, hx sleep apnea, complicated by AtrialFib.  3 months ago had lumbar spine surgery with rods and screws, complicated by ileitis. Denies respiratory complications.  Mild sleep apnea AHI 6.3- never tolerated CPAP. Confined more indoors with less exposure to Spring pollen. Taking her allergy shots, GO 1:10. Occasional allegra works ok. To see Dr Johnsie Cancel next week for f/u since feet have been swelling. Noting vasomotor rhinorhea before meals. She had glaucoma before cataract surgery so we chose not to start ipratropium.   04/16/11- 74 yoF followed for allergic rhinitis, hx sleep apnea, complicated by AtrialFib. Since last here had chicken pox and an episode of PAFib with spontaneous reversion to NSR this Fall. 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 Has had flu vax. Now feels well.  Had failed CPAP in the past- could not sleep with it. Now denies sleep concerns. Continues allergy vaccine at 1:10 and feels this is doing well.   04/17/12-  74 yoF followed for allergic rhinitis, hx sleep apnea, complicated by AtrialFib  Pt states that allergies have been doing very well. She states that she has just gotten over a cold recently.  Allergy vax 1:10  GO administered weekly.  Pt states doing well with sleep-husband keeps telling her that she snores. Back surgery  Oct 12, 2011--no effect on sleep.  She states that she has just gotten over a cold recently. . She is interested in learning about oral appliances for sleep apnea.- Discussed treatment options and sleep hygiene. NPSG 01/25/08- AHI 6/ hr.   04/17/13- 74 yoF followed for allergic rhinitis, hx sleep apnea/ oral appliance, complicated by AtrialFib  Pt states that allergies have been doing very well. She states that she has just gotten over a cold recently.  Allergy vax 1:10  GO administered weekly. (Sister of [name]) Doing well with allergy vaccine. We discussed EpiPen. No problem with reactions. Got an oral appliance for sleep apnea from Dr. Ron [name].. Husband tells her she does not snore when she wears it.  04/18/14- 74 yoF followed for allergic rhinitis, hx sleep apnea/ oral appliance, complicated by AtrialFib FOLLOW FOR:  Allergies; wearing mouth guard at night for sleep, does not use CPAP; has a lot of dark sinus drainage in throat in the mornings Allergy vax 1:10  GO administered weekly. (Sister of [name])  04/22/2015-74 year old female never smoker followed for allergic rhinitis, history sleep apnea/oral appliance, complicated by Atrial fib/ warfarin, parkinsonism, glaucoma Allergy vax 1:10  GO No wheezing, no asthma, no need for rescue inhaler. Rhinitis symptoms have been well-controlled.  Review of Systems- see HPI Constitutional:   No-   weight loss, night sweats, fevers, chills, fatigue, lassitude. HEENT:   No-  headaches, difficulty swallowing, tooth/dental problems, sore throat,       No-  sneezing, itching, ear ache, nasal congestion, post nasal drip,  CV:  No-   chest pain, orthopnea, PND, swelling in lower extremities, anasarca, dizziness, palpitations Resp: No-   shortness of breath with exertion or at rest.              No-   productive cough,  No non-productive cough,  No- coughing up of blood.              No-   change in color of mucus.  No- wheezing.   Skin: No-   rash or lesions.  GI:  No-   heartburn, indigestion, abdominal pain, nausea, vomiting,  GU:  MS:  No-   joint pain or swelling.   Neuro-     nothing unusual Psych:  No- change in mood or affect. No depression or anxiety.  No memory loss.  Objective:   Physical Exam General- Alert, Oriented, Affect-appropriate, Distress- none acute.  Skin- rash-none, lesions- none, excoriation- none Lymphadenopathy- none Head- atraumatic            Eyes- Gross vision intact, PERRLA, conjunctivae clear secretions             Ears- Hearing, canals-normal            Nose- Clear, no-Septal dev, mucus, polyps, erosion, perforation             Throat- Mallampati II-III , mucosa clear , drainage- none, tonsils- atrophic Neck- flexible , trachea midline, no stridor , thyroid nl, carotid no bruit Chest - symmetrical excursion , unlabored           Heart/CV- RRR/ no pacer , no murmur , no gallop  , no rub, nl s1 s2                           - JVD- none , edema- none, stasis changes- none, varices- none           Lung- clear to P&A, wheeze- none, cough- none , dullness-none, rub- none           Chest wall-  Abd-  Br/ Gen/ Rectal- Not done, not indicated Extrem- cyanosis- none, clubbing, none, atrophy- none, strength- nl. + Rolling walker Neuro- grossly intact to observation           Is

## 2015-04-22 NOTE — Patient Instructions (Signed)
Ok to get your allergy shot every other week

## 2015-04-22 NOTE — Assessment & Plan Note (Signed)
4 stable symptom control in recent years as she has continued allergy vaccine Plan-change allergy vaccine interval to every other week. If she does well over the next year consider stopping.

## 2015-04-22 NOTE — Assessment & Plan Note (Signed)
Oral appliance controls snore

## 2015-04-28 ENCOUNTER — Ambulatory Visit (INDEPENDENT_AMBULATORY_CARE_PROVIDER_SITE_OTHER): Payer: Medicare Other | Admitting: Podiatry

## 2015-04-28 ENCOUNTER — Encounter: Payer: Self-pay | Admitting: Podiatry

## 2015-04-28 VITALS — BP 133/58 | HR 79 | Resp 18

## 2015-04-28 DIAGNOSIS — R261 Paralytic gait: Secondary | ICD-10-CM | POA: Diagnosis not present

## 2015-04-28 DIAGNOSIS — R2689 Other abnormalities of gait and mobility: Secondary | ICD-10-CM

## 2015-04-28 DIAGNOSIS — M21379 Foot drop, unspecified foot: Secondary | ICD-10-CM

## 2015-04-28 DIAGNOSIS — M7740 Metatarsalgia, unspecified foot: Secondary | ICD-10-CM

## 2015-04-28 DIAGNOSIS — R262 Difficulty in walking, not elsewhere classified: Secondary | ICD-10-CM

## 2015-04-29 NOTE — Progress Notes (Signed)
Patient ID: [name], female   DOB: 1941-04-21, 74 y.o.   MRN: [id number]  Subjective: 74 year old female presents the office they for follow-up evaluation metatarsalgia, left dropfoot. She had the [place] made roughly 6 weeks ago and she is here for follow-up. She states that she was unable to wear the brace that much as it was inhibiting her exercises and she was having pain. She does continue to have pain in the ball of her foot. Denies any recent injury or trauma. No tenderness. No other complaints.  Objective: AAO 3, NAD DP/PT pulses 2/4, CRT less than 3 seconds MMT 4/5 in dorsiflexion on the left side which appears to be improved compared to last appointment, 5/5 on the right. Otherwise ankle, subtalar, midtarsal joint range of motion is intact without any restrictions. There is comments metatarsal heads plantarly mild atrophy of the fat pad with diffuse tenderness along the metatarsal heads 1-5 mostly on the left side. There is no tenderness to bilateral lower extremities of pinpoint tenderness. There is no pain vibratory sensation. No open lesions. No pain with calf compression, swelling, warmth and erythema.  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 Unfortunately we can return this is a custom-made brace. I did modify her orthotics which she purchased over-the-counter to help take pressure off the metatarsal heads. Continue with rehabilitation exercises of the dropfoot. She is no longer going physical therapy. Follow-up as needed or symptoms continue or worsen. Call if questions or concerns.  Celesta Gentile, DPM

## 2015-05-16 ENCOUNTER — Ambulatory Visit (INDEPENDENT_AMBULATORY_CARE_PROVIDER_SITE_OTHER): Payer: Medicare Other | Admitting: *Deleted

## 2015-05-16 DIAGNOSIS — Z5181 Encounter for therapeutic drug level monitoring: Secondary | ICD-10-CM | POA: Diagnosis not present

## 2015-05-16 DIAGNOSIS — I480 Paroxysmal atrial fibrillation: Secondary | ICD-10-CM

## 2015-05-16 DIAGNOSIS — I4891 Unspecified atrial fibrillation: Secondary | ICD-10-CM

## 2015-05-16 LAB — POCT INR: INR: 2.1

## 2015-05-20 ENCOUNTER — Telehealth: Payer: Self-pay | Admitting: Cardiovascular Disease

## 2015-05-20 NOTE — Telephone Encounter (Signed)
Patient received a letter from her insurance company saying they still had it listed that she was on Diltiazem and Dyazide. Reviewing the records, OV, medication list it looks like she has not been taking either if these medications for some time

## 2015-05-20 NOTE — Telephone Encounter (Signed)
°  New Prob   Pt has some questions regarding her medication list. Please call.

## 2015-05-29 ENCOUNTER — Other Ambulatory Visit: Payer: Self-pay | Admitting: Cardiovascular Disease

## 2015-05-29 ENCOUNTER — Other Ambulatory Visit: Payer: Self-pay | Admitting: Neurology

## 2015-05-29 NOTE — Telephone Encounter (Signed)
metoprolol succinate (TOPROL-XL) 100 MG 24 hr tablet  Medication   Date: 02/21/2015  Department: [address] Office  Ordering/Authorizing: Josue Hector, MD      Order Providers    Prescribing Provider Encounter Provider   Josue Hector, MD Josue Hector, MD    Medication Detail      Disp Refills Start End     metoprolol succinate (TOPROL-XL) 100 MG 24 hr tablet 90 tablet 0 02/21/2015     Sig: TAKE 1 BY MOUTH DAILY WITH OR IMMEDIATELY FOLLOWING A MEAL (CONTACT YOUR PROVIDER FOR AN APPOINTMENT)    Notes to Pharmacy: Patient needs an appointment for further refills    E-Prescribing Status: Receipt confirmed by pharmacy (02/21/2015 1:40 PM EDT)     Pharmacy    [facility] ([place]) [place], [place]   1st attempt, OV 03/2015 Complete

## 2015-06-05 ENCOUNTER — Other Ambulatory Visit: Payer: Self-pay | Admitting: Cardiovascular Disease

## 2015-06-10 ENCOUNTER — Other Ambulatory Visit: Payer: Self-pay

## 2015-06-10 NOTE — Telephone Encounter (Signed)
Approved      Disp Refills Start End    flecainide (TAMBOCOR) 50 MG tablet 180 tablet 0 06/05/2015     Sig:  TAKE 1 BY MOUTH TWICE DAILY PLEASE KEEP 03/24/15 APPOINTMENT FOR FURTHER REFILLS    Class:  Normal    DAW:  No    Comment:  Please call our office to schedule an 6 mos appointment for future refills. [phone]. Thank you 1st attempt    Authorizing Provider:  Josue Hector, MD    Ordering User:  Derl Barrow

## 2015-06-13 ENCOUNTER — Ambulatory Visit (INDEPENDENT_AMBULATORY_CARE_PROVIDER_SITE_OTHER): Payer: Medicare Other | Admitting: *Deleted

## 2015-06-13 DIAGNOSIS — Z5181 Encounter for therapeutic drug level monitoring: Secondary | ICD-10-CM

## 2015-06-13 DIAGNOSIS — I4891 Unspecified atrial fibrillation: Secondary | ICD-10-CM | POA: Diagnosis not present

## 2015-06-13 DIAGNOSIS — I480 Paroxysmal atrial fibrillation: Secondary | ICD-10-CM | POA: Diagnosis not present

## 2015-06-13 LAB — POCT INR: INR: 2.4

## 2015-06-24 ENCOUNTER — Encounter: Payer: Self-pay | Admitting: Neurology

## 2015-06-24 ENCOUNTER — Ambulatory Visit (INDEPENDENT_AMBULATORY_CARE_PROVIDER_SITE_OTHER): Payer: Medicare Other | Admitting: Neurology

## 2015-06-24 VITALS — BP 133/65 | HR 79 | Ht 65.0 in | Wt 210.4 lb

## 2015-06-24 DIAGNOSIS — G20 Parkinson's disease: Secondary | ICD-10-CM | POA: Diagnosis not present

## 2015-06-24 MED ORDER — CARBIDOPA-LEVODOPA 25-100 MG PO TABS: 1.0000 | ORAL_TABLET | Freq: Three times a day (TID) | ORAL | Status: DC

## 2015-06-24 NOTE — Progress Notes (Signed)
[facility]    Provider:  Dr [name] Referring Provider: [name], MD Primary Care Physician:  Simona Huh, MD     CC:  Parkinsonism  HPI:  [name] is a 75 y.o. female here as a referral from Dr. Marisue Humble for parkinsonism. She has past medical history of congestive heart failure, hypertension, hyperlipidemia, lumbar stenosis and scoliosis with radiculopathy, obstructive sleep apnea, degenerative joint disease, arthritis, chronic back pain.  Patient was noticed to have parkinsonian symptoms on exam. Sinemet was trialed over a year ago with reported benefits. She was on 1.5 tablets 4x a day.  Today patient and husband question whether Sinemet works, they don't remember is they benefited or not. Discussed, decided to stop the medication at their request and see if there is a difference. Her voice has not gotten softer over the years. Writing had gotten smaller but improved since starting the medication the Sinemet and Azilect. She talks in her sleep, she hollars in her sleep. She acts out the dreams and thrashes around, knocked husband out of bed once. She drools. She has incontinence. She feels like her balance is poor with near falls, she uses a walker. She has a hard time getting out of the tub and getting out of low seats. She used to freeze and she could not walk and she couldn't turn well but that has gotten better. She has some difficulty swallowing. She has trouble lifting arms and getting her coat on. Memory is fine. No hallucinations.   Interval History 07/25/2013: She feels great on the Sinemet. At home she is not using the cane. She is having some dyspepsia. PT has been good. She is surprised how many people have PD, she went to the support group. No side effects of the Sinemet. She is doing more around the house, getting around so much better.  Interval history 05/15/2014: [name] is a 75 y.o. female here as a follow up. She was a  previous patient of Dr. [name] and is transitioning care. She is getting weaker. She is on a new medication for afib. Problems walking, she is walking very slow, she has not fallen, can't turn, she freezes, she has difficulty getting out of a chair. She can't get her feet to move. She has pain in the feet ,in the toes. She has shuffling, doesn't pick up her feet. Has constipation. Smell is ok. No tremor. Has stiffness. She has some vision changes. Has dizziness upon standing. No FHx of neurodegenerative disorders. Memory is good overall. Takes gabapentin for the pain in her feet but she can't tolerate a higher dose. Her voice is lower, her handwriting is smaller. No tremor. Symptoms started over a year ago and are progressively getting worse. 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 She has not fallen. She eats well. No history of dopamine-blocking agents.   11/16/2013 Dr. [name]: [name] is a 75 y.o. female here as a follow up with new onset dizziness. States having these episodes off and on for the past 3 months. Fluctuates in severity but has been constant. Notes feeling light headed, no sensation of vertigo or movement. Notes feeling off balance, unsteady on her feet. Uses a walker, needs a walker or wall to support herself. States these episodes came on acutely. Episodes are worse with standing but can occur sitting. Not precipitated or made worse by movement or head turning. Notes some increased muscle weakness. Continues to have lower back pain, is followed  by neurosurgery, has received steroid injections in her hips.   EMG/NCS results: Nerve conduction studies done on both lower extremities shows no clear evidence of a generalized peripheral neuropathy. 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 On the right lower extremity, EMG evaluation reveals evidence of a mild primarily chronic L5 and S1 radiculopathy. No other significant abnormalities were seen.    Review of Systems: Patient complains of symptoms per HPI as well as the following symptoms: Runny nose, constipation, leg swelling, sleep talking, incontinence of bladder, urgency, dizziness,. Pertinent negatives per HPI. All others negative.  Social History   Social History  . Marital Status: Married    Spouse Name: [name]  . Number of Children: 2  . Years of Education: 12th    Occupational History  . retired     Radio producer work   Social History Main Topics  . Smoking status: Never Smoker   . Smokeless tobacco: Never Used  . Alcohol Use: No     Comment: 02/01/2014 " glasseof wine once in a blue moon; < once/year"  . Drug Use: No  . Sexual Activity: No     Comment: hysterectomy   Other Topics Concern  . Not on file   Social History Narrative   Patient lives at home with husband [name].   Patient is retired.   Patient has a high school education.    Patient has 2 children     Family History  Problem Relation Age of Onset  . Arthritis Mother   . Heart attack Father   . Hypertension Sister   . Asthma Sister   . Allergies Sister     Past Medical History  Diagnosis Date  . CHF (congestive heart failure) ([place])   . PAF (paroxysmal atrial fibrillation) ([place])   . Hypertension   . Allergic rhinitis   . Hyperlipidemia   . Lumbar stenosis   . Scoliosis     with radiculopathy L2-S1 with prior surgery  . Small bowel obstruction ([facility])     versus ileus after last bck surgery  . GERD (gastroesophageal reflux disease)   . OSA (obstructive sleep apnea)     "couldn't handle CPAP; use mouth guard some; not all the time" (01/06/2014)  . DJD  (degenerative joint disease)   . Spondylosis   . Arthritis     "all over"  . Chronic back pain     Past Surgical History  Procedure Laterality Date  . Nasal septum surgery  80's  . Total abdominal hysterectomy  02/1993  . Lipoma excision  1980's    "fatty tumors"  . Lumbar disc surgery  02/2009    "ruptured disc"  . Repair dural / csf leak  02/2009  . Cataract extraction w/ intraocular lens  implant, bilateral  2011  . Posterior lumbar fusion  06/2010; 10/2011    "placed screws, rods, spacers both times"  . Back surgery    . Colonoscopy  12/09/2004    Current Outpatient Prescriptions  Medication Sig Dispense Refill  . acetaminophen (TYLENOL) 500 MG tablet Take 1,000 mg by mouth daily as needed (pain).    . calcium carbonate (TUMS EX) 750 MG chewable  tablet Chew 2 tablets by mouth at bedtime.    . carbidopa-levodopa (SINEMET IR) 25-100 MG tablet TAKE 1 & 1/2 TABLETS BY MOUTH 4 TIMES A DAY, MAY TAKE AN EXTRA PILL IN THE EVENINGS IF NEEDED 630 tablet 1  . EPINEPHrine (EPIPEN 2-PAK) 0.3 mg/0.3 mL IJ SOAJ injection Inject 0.3 mg into the muscle as needed (allergic reaction). Inject into the thigh    . fexofenadine (ALLEGRA) 180 MG tablet Take 180 mg by mouth daily. For allergies    . flecainide (TAMBOCOR) 50 MG tablet TAKE 1 BY MOUTH TWICE DAILY PLEASE KEEP 03/24/15 APPOINTMENT FOR FURTHER REFILLS 180 tablet 0  . furosemide (LASIX) 20 MG tablet Take 20 mg by mouth daily.     Marland Kitchen gabapentin (NEURONTIN) 300 MG capsule Take 300 mg by mouth 2 (two) times daily.    . Linaclotide (LINZESS) 145 MCG CAPS capsule Take 145 mcg by mouth daily as needed (constipation).    . Magnesium 400 MG TABS Take 400 mg by mouth at bedtime.    . metolazone (ZAROXOLYN) 5 MG tablet 1 tab every other day    . metoprolol succinate (TOPROL-XL) 100 MG 24 hr tablet Take 1 tablet (100 mg total) by mouth daily. 90 tablet 3  . NONFORMULARY OR COMPOUNDED ITEM Allergy Vaccine 1:10 Given at Home    . potassium chloride SA  (K-DUR,KLOR-CON) 20 MEQ tablet 1 tab every  day    . pramipexole (MIRAPEX) 0.5 MG tablet Take 0.5 mg by mouth at bedtime. For restless legs    . pravastatin (PRAVACHOL) 40 MG tablet Take 40 mg by mouth at bedtime.     . TRAVATAN Z 0.004 % SOLN ophthalmic solution   0  . warfarin (COUMADIN) 5 MG tablet Take as directed by coumadin clinic 60 tablet 1   No current facility-administered medications for this visit.    Allergies as of 06/24/2015 - Review Complete 06/24/2015  Allergen Reaction Noted  . Lyrica [pregabalin] Other (See Comments) 01/31/2014  . Zocor [simvastatin] Other (See Comments) 04/17/2012  . Relafen [nabumetone] Rash 04/17/2012    Vitals: BP 133/65 mmHg  Pulse 79  Ht 5\' 5"  (1.651 m)  Wt 210 lb 6.4 oz (95.437 kg)  BMI 35.01 kg/m2 Last Weight:  Wt Readings from Last 1 Encounters:  06/24/15 210 lb 6.4 oz (95.437 kg)   Last Height:   Ht Readings from Last 1 Encounters:  06/24/15 5\' 5"  (1.651 m)     Speech:Mild hypophonia. No aphasia or dysarthria.  Cognition:  The patient is oriented to person, place, and time;   Cranial Nerves: Masked Facies  The pupils are equal, round, and reactive to light. Visual fields are full to finger confrontation. Extraocular movements are intact. Trigeminal sensation is intact and the muscles of mastication are normal. The face is symmetric. The palate elevates in the midline. Voice is normal. Shoulder shrug is normal. The tongue has normal motion without fasciculations.   Coordination:  finger to nose intact but slow, cannot perform HTS due to body habitus. Good finger tapping.   Gait:  Shuffling, slow initially but on today's exam good stride with walker. Decreased arm arm swing, stooped, Stooped.   Motor Observation: Masked Facies  no involuntary movements noted. Tone:  Mild Increased tone uppers with facilitation which improved on sinemet  Posture:  stooped   Strength:  Left DF 3+/5, otherwise  Strength is V/V in the upper and lower limbs.    Assessment/Plan: 75 year old female here as a follow up.  She was a former patient of Dr. [name]. Exam shows shuffling gait(improved on sinemet) with stooped posture, masked facies, hypophonia and hypographia, possible cogwheeling in the upper extremities, masked facies. No tremor. No history of Dopa blocking drugs. Parkinsonian features, responded to Dopaminergic drugs by report but today husband and wife would like to trial going off the drugs, they don't remember if the medication helped or not and would like a trial off of it. 1) Carbidopa/Levodopa(Sinemet) 25/100mg .: Decrease to 1 tid then can stop if you like. Restart if notice worsening gait symptoms 2) Follow up in 4-6 months with Dr. [name] for a second opinion  Sarina Ill, MD  [facility] [address] [place], [place] [address]  Phone [phone] Fax [phone]  A total of 30 minutes was spent face-to-face with this patient. Over half this time was spent on counseling patient on the parkinsonism diagnosis and different diagnostic and therapeutic options available.

## 2015-06-24 NOTE — Patient Instructions (Signed)
Remember to drink plenty of fluid, eat healthy meals and do not skip any meals. Try to eat protein with a every meal and eat a healthy snack such as fruit or nuts in between meals. Try to keep a regular sleep-wake schedule and try to exercise daily, particularly in the form of walking, 20-30 minutes a day, if you can.   As far as your medications are concerned, I would like to suggest: Stop the Azilect. Decrease Carbidopa/Levodopa to one tablet three times a day.   I would like to see you back in 4 months, sooner if we need to. Please call us with any interim questions, concerns, problems, updates or refill requests.   Our phone number is [phone]. We also have an after hours call service for urgent matters and there is a physician on-call for urgent questions. For any emergencies you know to call 911 or go to the nearest emergency room

## 2015-06-30 ENCOUNTER — Encounter: Payer: Self-pay | Admitting: Podiatry

## 2015-06-30 ENCOUNTER — Ambulatory Visit (INDEPENDENT_AMBULATORY_CARE_PROVIDER_SITE_OTHER): Payer: Medicare Other | Admitting: Podiatry

## 2015-06-30 DIAGNOSIS — M21379 Foot drop, unspecified foot: Secondary | ICD-10-CM

## 2015-06-30 DIAGNOSIS — M7740 Metatarsalgia, unspecified foot: Secondary | ICD-10-CM | POA: Diagnosis not present

## 2015-06-30 DIAGNOSIS — R261 Paralytic gait: Secondary | ICD-10-CM

## 2015-06-30 DIAGNOSIS — R2689 Other abnormalities of gait and mobility: Principal | ICD-10-CM

## 2015-06-30 NOTE — Progress Notes (Signed)
Patient ID: [name], female   DOB: 1941-01-11, 75 y.o.   MRN: [id number]  Subjective: 75 year old female presents the office they for follow-up evaluation metatarsalgia, left dropfoot. She states that she has improved to the left foot. She has not been doing physical therapy at home or with a therapist. She is not wearing the [place] braces she states is not fitting well. She wishes to hold off on the brace at this time. She states the metatarsal pads which were dispensed last appointment helped tremendously and she felt resolution of symptoms after wearing those. No other complaints at this time.  Objective: AAO 3, NAD DP/PT pulses 2/4, CRT less than 3 seconds MMT 4/5 in dorsiflexion on the left side, 5/5 on the right.  There is no area tenderness to bilateral lower extremities at this time. There is prominence of the metatarsal heads plantarly mild atrophy of the fat pad with diffuse tenderness along the metatarsal heads 1-5 mostly on the left side which has improved. There is no tenderness to bilateral lower extremities of pinpoint tenderness. There is no pain vibratory sensation. No open lesions. No pain with calf compression, swelling, warmth and erythema.  Assessment:  75 year old female with left dropfoot which is improving; metatarsalgia  Plan: -Treatment options discussed including all alternatives, risks, and complications -I do recommend continued physical therapy at least at home and discuss exercises. I also discuss formal physical therapy but she wishes to hold off. We will hold off on the brace for now. She said that she is not falling and she is not dragging her foot at this time. -I will see her back in 3 months for further evaluation or sooner if any problems are to arise.   Celesta Gentile, DPM

## 2015-07-10 ENCOUNTER — Other Ambulatory Visit: Payer: Self-pay | Admitting: *Deleted

## 2015-07-10 ENCOUNTER — Telehealth: Payer: Self-pay | Admitting: Cardiovascular Disease

## 2015-07-10 MED ORDER — WARFARIN SODIUM 5 MG PO TABS: ORAL_TABLET | ORAL | Status: DC

## 2015-07-10 NOTE — Telephone Encounter (Signed)
Warfarin/Coumadin refilled as requested

## 2015-07-10 NOTE — Telephone Encounter (Signed)
°*  STAT* If patient is at the pharmacy, call can be transferred to refill team.   1. Which medications need to be refilled? (please list name of each medication and dose if known) Warfarin 5 mg-need new prescription-she have changed pharmacy  2. Which pharmacy/location (including street and city if local pharmacy) is medication to be sent to?[place]  Mail Order-Fax# [phone]  3. Do they need a 30 day or 90 day supply? 180 and refills

## 2015-07-11 ENCOUNTER — Ambulatory Visit (INDEPENDENT_AMBULATORY_CARE_PROVIDER_SITE_OTHER): Payer: Medicare Other | Admitting: *Deleted

## 2015-07-11 DIAGNOSIS — I4891 Unspecified atrial fibrillation: Secondary | ICD-10-CM | POA: Diagnosis not present

## 2015-07-11 DIAGNOSIS — H409 Unspecified glaucoma: Secondary | ICD-10-CM | POA: Diagnosis not present

## 2015-07-11 DIAGNOSIS — N183 Chronic kidney disease, stage 3 (moderate): Secondary | ICD-10-CM | POA: Diagnosis not present

## 2015-07-11 DIAGNOSIS — M21379 Foot drop, unspecified foot: Secondary | ICD-10-CM | POA: Diagnosis not present

## 2015-07-11 DIAGNOSIS — I129 Hypertensive chronic kidney disease with stage 1 through stage 4 chronic kidney disease, or unspecified chronic kidney disease: Secondary | ICD-10-CM | POA: Diagnosis not present

## 2015-07-11 DIAGNOSIS — Z5181 Encounter for therapeutic drug level monitoring: Secondary | ICD-10-CM

## 2015-07-11 DIAGNOSIS — I480 Paroxysmal atrial fibrillation: Secondary | ICD-10-CM | POA: Diagnosis not present

## 2015-07-11 DIAGNOSIS — R609 Edema, unspecified: Secondary | ICD-10-CM | POA: Diagnosis not present

## 2015-07-11 DIAGNOSIS — G2581 Restless legs syndrome: Secondary | ICD-10-CM | POA: Diagnosis not present

## 2015-07-11 DIAGNOSIS — R7302 Impaired glucose tolerance (oral): Secondary | ICD-10-CM | POA: Diagnosis not present

## 2015-07-11 DIAGNOSIS — E7800 Pure hypercholesterolemia, unspecified: Secondary | ICD-10-CM | POA: Diagnosis not present

## 2015-07-11 DIAGNOSIS — G20 Parkinson's disease: Secondary | ICD-10-CM | POA: Diagnosis not present

## 2015-07-11 LAB — POCT INR: INR: 2.3

## 2015-07-15 ENCOUNTER — Encounter: Payer: Self-pay | Admitting: Cardiovascular Disease

## 2015-08-11 ENCOUNTER — Telehealth: Payer: Self-pay | Admitting: Internal Medicine

## 2015-08-11 DIAGNOSIS — J309 Allergic rhinitis, unspecified: Secondary | ICD-10-CM | POA: Diagnosis not present

## 2015-08-11 NOTE — Telephone Encounter (Signed)
Allergy Serum Extract Date Mixed: 08/11/15 Vial: 2 Strength: 1:10 Here/Mail/Pick Up: mail Mixed By: tbs Last OV: 04/22/15 Pending OV: 04/21/16

## 2015-08-14 DIAGNOSIS — R040 Epistaxis: Secondary | ICD-10-CM | POA: Diagnosis not present

## 2015-08-20 ENCOUNTER — Other Ambulatory Visit: Payer: Self-pay | Admitting: *Deleted

## 2015-08-20 MED ORDER — FLECAINIDE ACETATE 50 MG PO TABS: 50.0000 mg | ORAL_TABLET | Freq: Two times a day (BID) | ORAL | Status: DC

## 2015-08-22 ENCOUNTER — Ambulatory Visit (INDEPENDENT_AMBULATORY_CARE_PROVIDER_SITE_OTHER): Payer: Medicare Other | Admitting: *Deleted

## 2015-08-22 DIAGNOSIS — I480 Paroxysmal atrial fibrillation: Secondary | ICD-10-CM

## 2015-08-22 DIAGNOSIS — Z5181 Encounter for therapeutic drug level monitoring: Secondary | ICD-10-CM

## 2015-08-22 DIAGNOSIS — I4891 Unspecified atrial fibrillation: Secondary | ICD-10-CM | POA: Diagnosis not present

## 2015-08-22 LAB — POCT INR: INR: 1.9

## 2015-08-26 DIAGNOSIS — R040 Epistaxis: Secondary | ICD-10-CM | POA: Diagnosis not present

## 2015-09-02 DIAGNOSIS — H401131 Primary open-angle glaucoma, bilateral, mild stage: Secondary | ICD-10-CM | POA: Diagnosis not present

## 2015-09-02 DIAGNOSIS — H04123 Dry eye syndrome of bilateral lacrimal glands: Secondary | ICD-10-CM | POA: Diagnosis not present

## 2015-09-29 ENCOUNTER — Ambulatory Visit (INDEPENDENT_AMBULATORY_CARE_PROVIDER_SITE_OTHER): Payer: Medicare Other | Admitting: Podiatry

## 2015-09-29 DIAGNOSIS — M7740 Metatarsalgia, unspecified foot: Secondary | ICD-10-CM | POA: Diagnosis not present

## 2015-09-29 DIAGNOSIS — M21379 Foot drop, unspecified foot: Secondary | ICD-10-CM

## 2015-09-29 DIAGNOSIS — R261 Paralytic gait: Secondary | ICD-10-CM

## 2015-09-29 DIAGNOSIS — R2689 Other abnormalities of gait and mobility: Principal | ICD-10-CM

## 2015-09-29 NOTE — Progress Notes (Signed)
Patient ID: [name], female   DOB: 1940-06-30, 75 y.o.   MRN: [id number]    Cardiology Office Note   Date:  09/30/2015   ID:  [name], DOB December 19, 1940, MRN [id number]  PCP:  Simona Huh, MD  Cardiologist:  [name]    Chief Complaint  Patient presents with  . Hypertension    somw SOB & dizziness, per pt      History of Present Illness: [name] is a 75 y.o. female who presents for follow up of HTN and PAF.  She is on coumadin followed by our office.  She has hx of a heart catheterization in Feb. 2002 that demonstrated normal coronary arteries and normal LV function. A Myoview study done in July 2009 demonstrated no ischemia. An echocardiogram done in July 2012 demonstrated normal systolic function with an EF of 55-60%.   Echo 2015 revealing normal LV function with an EF of 60-65%. Wall motion was normal; there were no regional wall motion abnormalities. The left atrium was normal in size.    Myovue 9/15 normal EF no ischemia  Flecainide added 04/03/14.  Is followed by Neuro for parkinson's.  Has been fitted for a brace on lt leg.  Uses a walker to ambulate.  No chest pain, no SOB.  No awareness of palpitations.  Her husband also a patient of mine  Had a heart transplant at [facility] years ago   Pt's wt is up, not exercising, and she admits to eating fast foods, discussed dietary changes.     Past Medical History  Diagnosis Date  . CHF (congestive heart failure) ([place])   . PAF (paroxysmal atrial fibrillation) ([place])   . Hypertension   . Allergic rhinitis   . Hyperlipidemia   . Lumbar stenosis   . Scoliosis     with radiculopathy L2-S1 with prior surgery  . Small bowel obstruction ([facility])     versus ileus after last bck surgery  . GERD (gastroesophageal reflux disease)   . OSA (obstructive sleep apnea)     "couldn't handle CPAP; use mouth guard some; not all the time" (01/06/2014)  . DJD (degenerative joint disease)   . Spondylosis   .  Arthritis     "all over"  . Chronic back pain     Past Surgical History  Procedure Laterality Date  . Nasal septum surgery  80's  . Total abdominal hysterectomy  02/1993  . Lipoma excision  1980's    "fatty tumors"  . Lumbar disc surgery  02/2009    "ruptured disc"  . Repair dural / csf leak  02/2009  . Cataract extraction w/ intraocular lens  implant, bilateral  2011  . Posterior lumbar fusion  06/2010; 10/2011    "placed screws, rods, spacers both times"  . Back surgery    . Colonoscopy  12/09/2004     Current Outpatient Prescriptions  Medication Sig Dispense Refill  . acetaminophen (TYLENOL) 500 MG tablet Take 1,000 mg by mouth daily as needed (pain).    . calcium carbonate (TUMS EX) 750 MG chewable tablet Chew 2 tablets by mouth at bedtime.    . carbidopa-levodopa (SINEMET IR) 25-100 MG tablet Take 1 tablet by mouth 3 (three) times daily. 630 tablet 1  . EPINEPHrine (EPIPEN 2-PAK) 0.3 mg/0.3 mL IJ SOAJ injection Inject 0.3 mg into the muscle as needed (allergic reaction). Inject into the thigh    . fexofenadine (ALLEGRA) 180 MG tablet Take 180 mg by mouth daily. For allergies    .  flecainide (TAMBOCOR) 50 MG tablet Take 1 tablet (50 mg total) by mouth 2 (two) times daily. 180 tablet 1  . furosemide (LASIX) 20 MG tablet Take 20 mg by mouth daily.     Marland Kitchen gabapentin (NEURONTIN) 300 MG capsule Take 300 mg by mouth 2 (two) times daily.    . Linaclotide (LINZESS) 145 MCG CAPS capsule Take 145 mcg by mouth daily as needed (constipation).    . Magnesium 400 MG TABS Take 400 mg by mouth at bedtime.    . metolazone (ZAROXOLYN) 5 MG tablet Take 5 mg by mouth daily.    . metoprolol succinate (TOPROL-XL) 100 MG 24 hr tablet Take 1 tablet (100 mg total) by mouth daily. 90 tablet 3  . potassium chloride SA (K-DUR,KLOR-CON) 20 MEQ tablet Take 20 mEq by mouth daily.    . pramipexole (MIRAPEX) 0.5 MG tablet Take 0.5 mg by mouth at bedtime. For restless legs    . pravastatin (PRAVACHOL) 40 MG tablet  Take 40 mg by mouth at bedtime.     . TRAVATAN Z 0.004 % SOLN ophthalmic solution   0  . warfarin (COUMADIN) 5 MG tablet Take as directed by coumadin clinic 60 tablet 1   No current facility-administered medications for this visit.    Allergies:   Lyrica; Zocor; and Relafen    Social History:  The patient  reports that she has never smoked. She has never used smokeless tobacco. She reports that she does not drink alcohol or use illicit drugs.   Family History:  The patient's family history includes Allergies in her sister; Arthritis in her mother; Asthma in her sister; Heart attack in her father; Hypertension in her sister.    ROS:  General:no colds or fevers, incr. of weight - not exercising Skin:no rashes or ulcers HEENT:no blurred vision, no congestion CV:see HPI PUL:see HPI GI:no diarrhea constipation or melena, no indigestion GU:no hematuria, no dysuria- for routine colonoscopy next week MS:no joint pain, no claudication- getting a brace for Lt leg, using a walker Neuro:no syncope, no lightheadedness Endo:no diabetes, no thyroid disease  Wt Readings from Last 3 Encounters:  09/30/15 94.257 kg (207 lb 12.8 oz)  06/24/15 95.437 kg (210 lb 6.4 oz)  04/22/15 94.257 kg (207 lb 12.8 oz)     PHYSICAL EXAM: VS:  BP 126/60 mmHg  Pulse 85  Ht 5\' 6"  (1.676 m)  Wt 94.257 kg (207 lb 12.8 oz)  BMI 33.56 kg/m2  SpO2 99% , BMI Body mass index is 33.56 kg/(m^2). General:Pleasant affect, NAD Skin:Warm and dry, brisk capillary refill HEENT:normocephalic, sclera clear, mucus membranes moist Neck:supple, no JVD, no bruits  Heart:S1S2 RRR without murmur, gallup, rub or click Lungs:clear without rales, rhonchi, or wheezes [id number], non tender, + BS, do not palpate liver spleen or masses Ext:no lower ext edema, 2+ pedal pulses, 2+ radial pulses Neuro:masked facies broad based gait stiffness in legs/arms Parkinson's     EKG:  EKG is ordered today. The ekg ordered today demonstrates SR  with PAC poor R wave progression , no acute changes otherwise.    Recent Labs: No results found for requested labs within last 365 days.    Lipid Panel    Component Value Date/Time   CHOL  12/22/2006 0550    183        ATP III CLASSIFICATION:  <200     mg/dL   Desirable  200-239  mg/dL   Borderline High  >=240    mg/dL   High  TRIG 174* 12/22/2006 0550   HDL 47 12/22/2006 0550   CHOLHDL 3.9 12/22/2006 0550   VLDL 35 12/22/2006 0550   LDLCALC * 12/22/2006 0550    101        Total Cholesterol/HDL:CHD Risk Coronary Heart Disease Risk Table                     Men   Women  1/2 Average Risk   3.4   3.3       Other studies Reviewed: Additional studies/ records that were reviewed today include: previous notes and labs.   ASSESSMENT AND PLAN:  1.  PAF- maintaining SR.  On flecainide.  Anticoagulation on coumadin, with INR RX  Seeing clinic today  2. HTN  Home readings fine some white coat component does best with diuretic on board  3.  Hyperlipidemia checked with PCP in August with T chol 150, LDL 70, HDL 56.  She will continue with pravachol.  4. Parkinson's  Will see new neurologist next month at [facility].  Concern that some of her leg weakness and dificulty Walking is from chronic back problems. Had surgeries with Dr Luiz Ochoa in passed. Discussed having Dr Wyline Mood refer Her back to neurosurgery for MRI and make sure no neuro muscular issues   Current medicines are reviewed with the patient today.  The patient Has no concerns regarding medicines.   Signed, Jenkins Rouge, MD  09/30/2015 11:29 AM    [facility], [place], [place] Phone: [phone]; Fax: [phone]

## 2015-09-30 ENCOUNTER — Ambulatory Visit (INDEPENDENT_AMBULATORY_CARE_PROVIDER_SITE_OTHER): Payer: Medicare Other | Admitting: Cardiovascular Disease

## 2015-09-30 ENCOUNTER — Encounter: Payer: Self-pay | Admitting: Cardiovascular Disease

## 2015-09-30 ENCOUNTER — Ambulatory Visit (INDEPENDENT_AMBULATORY_CARE_PROVIDER_SITE_OTHER): Payer: Medicare Other | Admitting: *Deleted

## 2015-09-30 VITALS — BP 126/60 | HR 85 | Ht 66.0 in | Wt 207.8 lb

## 2015-09-30 DIAGNOSIS — I4891 Unspecified atrial fibrillation: Secondary | ICD-10-CM

## 2015-09-30 DIAGNOSIS — I480 Paroxysmal atrial fibrillation: Secondary | ICD-10-CM

## 2015-09-30 DIAGNOSIS — I10 Essential (primary) hypertension: Secondary | ICD-10-CM | POA: Diagnosis not present

## 2015-09-30 DIAGNOSIS — Z5181 Encounter for therapeutic drug level monitoring: Secondary | ICD-10-CM

## 2015-09-30 LAB — POCT INR: INR: 2.5

## 2015-09-30 NOTE — Patient Instructions (Signed)

## 2015-09-30 NOTE — Progress Notes (Signed)
Patient ID: [name], female   DOB: [birth year], 75 y.o.   MRN: [id number]   Subjective: 76 year old female presents the office they for follow-up evaluation metatarsalgia, left dropfoot. She has continued strengthening exercises and her left foot has improved since doing this. She does not achieve the brace. She is not having pain in the ball of her foot at this time. No other complaints at this time.  Objective: AAO 3, NAD DP/PT pulses 2/4, CRT less than 3 seconds MMT 4/5 in dorsiflexion on the left side, 5/5 on the right. Overall her strength has improved on the left There is no area tenderness to bilateral lower extremities at this time. There is prominence of the metatarsal heads plantarly mild atrophy of the fat pad with diffuse tenderness along the metatarsal heads 1-5 mostly on the left side which has improved. There is no tenderness to bilateral lower extremities of pinpoint tenderness. There is no pain vibratory sensation. No open lesions. No pain with calf compression, swelling, warmth and erythema.  Assessment:  75 year old female with left dropfoot which has improved; metatarsalgia  Plan: -Treatment options discussed including all alternatives, risks, and complications -Continue strengthening exercises and home. This plan don't believe that she needs a brace and she does not either. She wishes to hold off on formal physical therapy and continue this at home. I will see her back as needed. There is any worsening symptoms to call the office. Also follow-up with neurology.  Celesta Gentile, DPM

## 2015-10-16 ENCOUNTER — Ambulatory Visit (INDEPENDENT_AMBULATORY_CARE_PROVIDER_SITE_OTHER): Payer: Medicare Other | Admitting: Neurology

## 2015-10-16 ENCOUNTER — Encounter: Payer: Self-pay | Admitting: Neurology

## 2015-10-16 VITALS — BP 162/96 | HR 78 | Resp 16 | Ht 66.0 in | Wt 208.0 lb

## 2015-10-16 DIAGNOSIS — G20 Parkinson's disease: Secondary | ICD-10-CM

## 2015-10-16 NOTE — Progress Notes (Signed)
Subjective:    Patient ID: [name] is a 75 y.o. female.  HPI     Interim history:   Dear Berta Minor,   I saw your patient, [name], upon your kind request in my clinic today for second opinion of her parkinsonism. The patient is accompanied by her husband today. As you know, [name] is a 75 year old right-handed woman with an underlying history of CHF, PAF, hypertension, allergic rhinitis, hyperlipidemia, lumbar spinal stenosis, scoliosis, history of small bowel obstruction, reflux disease, OSA, degenerative spine disease, arthritis, chronic back pain, status post nasal septum surgery, hysterectomy, lumbar spine surgery, repair for CSF leak, and cataract extractions, who was noted to have parkinsonism in 2015. She was placed on a trial of Sinemet in early 2015 and felt it helped with slowness, her gait difficulty, including freezing episodes. She has a history of dream enactments including talking in her sleep, flailing movements in her sleep and she has a history of poor balance and handwriting changes. I reviewed your office note from 06/24/2015. At that time she requested to be tapered off of Sinemet. She had increase it to up to 1-1/2 pills 4 times a day. She was also on Azilect. You tapered her off of her medications.  Of note, she is off of Azilect and reduced her Sinemet as instructed to 1 pill 3 times a day. She did not notice any significant repercussions after stopping the Azilect, which was too expensive for her and after reducing the Sinemet. She tolerates the medication. Her highest dose was indeed 1-1/2 pills 4 times a day for the Sinemet.  She had a recent fall at the house. She admits that she was trying to throw something onto her bed and move out of the room quickly and she fell inside the bedroom. Thankfully, she did not injure herself. No loss of consciousness.  She had a brain MRI without contrast on 11/28/2013:  IMPRESSION:   Equivocal MRI brain  (without) demonstrating: 1. Few punctate foci of non-specific gliosis in the periventricular and subcortical white matter. 2. Mild frontal atrophy.   3. No acute findings.   She had a CTH wo conctrast on 10/12/13: IMPRESSION: No acute intracranial abnormality. Chronic and involutional changes Appreciated.  She has very occasional calling out in her dreams, has had more stress, both sisters don't help very much with her mother's house sale. Memory and mood have been good, denies any hallucinations, sleeps fairly well. Husband is a heart transplant pt and has a LHC at [facility] soon.  Patient has 2 grown daughters, both in the area and one grown GD.   She has no family history of Parkinson's disease.   Her Past Medical History Is Significant For: Past Medical History  Diagnosis Date  . CHF (congestive heart failure) ([place])   . PAF (paroxysmal atrial fibrillation) ([place])   . Hypertension   . Allergic rhinitis   . Hyperlipidemia   . Lumbar stenosis   . Scoliosis     with radiculopathy L2-S1 with prior surgery  . Small bowel obstruction ([facility])     versus ileus after last bck surgery  . GERD (gastroesophageal reflux disease)   . OSA (obstructive sleep apnea)     "couldn't handle CPAP; use mouth guard some; not all the time" (01/06/2014)  . DJD (degenerative joint disease)   . Spondylosis   . Arthritis     "all over"  . Chronic back pain     Her Past Surgical History Is Significant For:  Past Surgical History  Procedure Laterality Date  . Nasal septum surgery  80's  . Total abdominal hysterectomy  02/1993  . Lipoma excision  1980's    "fatty tumors"  . Lumbar disc surgery  02/2009    "ruptured disc"  . Repair dural / csf leak  02/2009  . Cataract extraction w/ intraocular lens  implant, bilateral  2011  . Posterior lumbar fusion  06/2010; 10/2011    "placed screws, rods, spacers both times"  . Back surgery    . Colonoscopy  12/09/2004    Her Family History Is Significant For: Family  History  Problem Relation Age of Onset  . Arthritis Mother   . Heart attack Father   . Hypertension Sister   . Asthma Sister   . Allergies Sister     Her Social History Is Significant For: Social History   Social History  . Marital Status: Married    Spouse Name: [name]  . Number of Children: 2  . Years of Education: 12th    Occupational History  . retired     Radio producer work   Social History Main Topics  . Smoking status: Never Smoker   . Smokeless tobacco: Never Used  . Alcohol Use: No     Comment: 02/01/2014 " glasseof wine once in a blue moon; < once/year"  . Drug Use: No  . Sexual Activity: No     Comment: hysterectomy   Other Topics Concern  . None   Social History Narrative   Patient lives at home with husband [name].   Patient is retired.   Patient has a high school education.    Patient has 2 children     Her Allergies Are:  Allergies  Allergen Reactions  . Lyrica [Pregabalin] Other (See Comments)    Felt loopy  . Zocor [Simvastatin] Other (See Comments)    Myalgias  . Relafen [Nabumetone] Rash  :   Her Current Medications Are:  Outpatient Encounter Prescriptions as of 10/16/2015  Medication Sig  . acetaminophen (TYLENOL) 500 MG tablet Take 1,000 mg by mouth daily as needed (pain).  . calcium carbonate (TUMS EX) 750 MG chewable tablet Chew 2 tablets by mouth at bedtime.  . carbidopa-levodopa (SINEMET IR) 25-100 MG tablet Take 1 tablet by mouth 3 (three) times daily.  Marland Kitchen EPINEPHrine (EPIPEN 2-PAK) 0.3 mg/0.3 mL IJ SOAJ injection Inject 0.3 mg into the muscle as needed (allergic reaction). Inject into the thigh  . fexofenadine (ALLEGRA) 180 MG tablet Take 180 mg by mouth daily. For allergies  . flecainide (TAMBOCOR) 50 MG tablet Take 1 tablet (50 mg total) by mouth 2 (two) times daily.  . furosemide (LASIX) 20 MG tablet Take 20 mg by mouth daily.   Marland Kitchen gabapentin (NEURONTIN) 300 MG capsule Take 300 mg by mouth 2 (two) times daily.  . Linaclotide  (LINZESS) 145 MCG CAPS capsule Take 145 mcg by mouth daily as needed (constipation).  . Magnesium 400 MG TABS Take 400 mg by mouth at bedtime.  . metolazone (ZAROXOLYN) 5 MG tablet Take 5 mg by mouth daily.  . metoprolol succinate (TOPROL-XL) 100 MG 24 hr tablet Take 1 tablet (100 mg total) by mouth daily.  . potassium chloride SA (K-DUR,KLOR-CON) 20 MEQ tablet Take 20 mEq by mouth daily.  . pramipexole (MIRAPEX) 0.5 MG tablet Take 0.5 mg by mouth at bedtime. For restless legs  . pravastatin (PRAVACHOL) 40 MG tablet Take 40 mg by mouth at bedtime.   . TRAVATAN Z 0.004 %  SOLN ophthalmic solution   . warfarin (COUMADIN) 5 MG tablet Take as directed by coumadin clinic   No facility-administered encounter medications on file as of 10/16/2015.  :  Review of Systems:  Out of a complete 14 point review of systems, all are reviewed and negative with the exception of these symptoms as listed below:   Review of Systems  Neurological:       Patient reports that her biggest complaint is "light headedness" and gait problems. She says that she is unbalanced, walks slowly, and turns around slowly.  Denies tremors and is still taking Sinemet and Mirapex.     Objective:  Neurologic Exam  Physical Exam Physical Examination:   Filed Vitals:   10/16/15 1336  BP: 162/96  Pulse: 78  Resp: 16   General Examination: The patient is a very pleasant 75 y.o. female in no acute distress. She appears well-developed and well-nourished and very well groomed.   HEENT: Normocephalic, atraumatic, pupils are equal, round and reactive to light and accommodation. Funduscopic exam is normal with sharp disc margins noted. She is status post bilateral cataract repairs. Extraocular tracking is fairly good with overall very mild saccadic breakdown and slight limitation to upper gaze. No diplopia is reported. Face is symmetric with mild facial masking noted, neck is mildly rigid, full range of motion, no carotid bruits.  Oropharynx exam reveals mild mouth dryness, no drooling, no dysarthria on speech exam. She has no lip, neck or jaw tremor.  Chest: Clear to auscultation without wheezing, rhonchi or crackles noted.  Heart: S1+S2+0, regular and normal without murmurs, rubs or gallops noted.   Abdomen: Soft, non-tender and non-distended with normal bowel sounds appreciated on auscultation.  Extremities: There is trace pitting edema in the distal lower extremities bilaterally. Pedal pulses are intact.  Skin: Warm and dry without trophic changes noted. There are no varicose veins.she does have mild distal lower extremity discoloration and redness and age-related changes in the distal lower extremities, chronic swelling related changes with mild hyperpigmentation noted as well.  Musculoskeletal: exam reveals no obvious joint deformities, tenderness or joint swelling or erythema, reports stiffness and lower extremity joints and low back stiffness.    Neurologically:  Mental status: The patient is awake, alert and oriented in all 4 spheres. Her immediate and remote memory, attention, language skills and fund of knowledge are appropriate. There is no evidence of aphasia, agnosia, apraxia or anomia. Speech is clear with normal prosody and enunciation. Thought process is linear. Mood is normal and affect is normal.  Cranial nerves II - XII are as described above under HEENT exam. In addition: shoulder shrug is normal with equal shoulder height noted. Motor exam: Normal bulk, and strength is noted. There is no drift, Resting tremor or rebound. she has no postural or action tremor.she has mild increase in tone throughout, no actual cogwheeling is noted. Romberg is negative. Reflexes are 1+ in the upper extremities, absent in the lower extremities.sensory exam is decreased to all modalities in the distal lower extremities, probably mid shin area. Fine motor skills and coordination:she has overall mild to moderate fine motor  problems with finger taps, hand movements, rapid alternating patting, foot taps and foot agility, perhaps slightly worse on the left compared to right. Heel-to-shin is not possible. She stands up with mild difficulty and has to push her cell phone. Posture is moderately stooped. She walks slowly with decreased stride length and pace and decreased arm swing bilaterally. She brought her 4 wheeled walker with  seat which she maneuvers quite well. Balance is impaired.  Assessment and plan:   In summary, KENYOTA LIZAK is a very pleasant 75 y.o.-year old female  with an underlying history of CHF, PAF, hypertension, allergic rhinitis, hyperlipidemia, lumbar spinal stenosis, scoliosis, history of small bowel obstruction, reflux disease, OSA, degenerative spine disease, arthritis, chronic back pain, status post nasal septum surgery, hysterectomy, lumbar spine surgery, repair for CSF leak, and cataract extractions, who presents for second opinion of at least 2 year history of slowness, stiffness, gait disorder, fine motor dyscontrol. Her history and exam are indeed concerning for parkinsonism, maybe mild lateralization to the left, she was placed on Sinemet in 2015 and reported initially improvement in her symptoms. She does not give any sinister atypical history for atypical parkinsonism but does not have a very strong history or telltale exam for idiopathic Parkinson's disease either. Nevertheless, I would agree with you that she has parkinsonism, thankfully no significant memory loss, no significant mood disorder. 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 She is advised to continue with Sinemet, I would like to suggest that she increase this back to 1-1/2 pills 4 times a day, at 8, 12, 4 PM and 8 PM as previously. She can stay off of the [place]. She is advised to stay better hydrated with water and use her walker at all times for gait safety. I  suggested a four-month follow-up with you.  I answered all her questions today and the patient and her husband were in agreement.  Thank you very much for allowing me to participate in the care of this nice patient. If I can be of any further assistance to you please do not hesitate to talk to me.  Sincerely,   Star Age, MD, PhD

## 2015-10-16 NOTE — Patient Instructions (Signed)
I think you can stay off of the [place].  You can increase the Sinemet back to 1 1/2 pills 4 times a day, 8 AM, 12, 4 PM and 8 PM.  Stay well hydrated.  Stay active, use your walker, and use exercise as treatment.  You can follow up with Dr. [name] in 4 months.

## 2015-10-21 ENCOUNTER — Ambulatory Visit: Payer: Medicare Other | Admitting: Neurology

## 2015-10-22 ENCOUNTER — Ambulatory Visit: Payer: Medicare Other | Admitting: Neurology

## 2015-10-24 DIAGNOSIS — H401131 Primary open-angle glaucoma, bilateral, mild stage: Secondary | ICD-10-CM | POA: Diagnosis not present

## 2015-10-24 DIAGNOSIS — H43811 Vitreous degeneration, right eye: Secondary | ICD-10-CM | POA: Diagnosis not present

## 2015-10-24 DIAGNOSIS — H04123 Dry eye syndrome of bilateral lacrimal glands: Secondary | ICD-10-CM | POA: Diagnosis not present

## 2015-10-27 ENCOUNTER — Ambulatory Visit: Payer: Medicare Other | Admitting: Neurology

## 2015-11-04 DIAGNOSIS — H04123 Dry eye syndrome of bilateral lacrimal glands: Secondary | ICD-10-CM | POA: Diagnosis not present

## 2015-11-04 DIAGNOSIS — H401131 Primary open-angle glaucoma, bilateral, mild stage: Secondary | ICD-10-CM | POA: Diagnosis not present

## 2015-11-04 DIAGNOSIS — H43811 Vitreous degeneration, right eye: Secondary | ICD-10-CM | POA: Diagnosis not present

## 2015-11-10 ENCOUNTER — Ambulatory Visit (INDEPENDENT_AMBULATORY_CARE_PROVIDER_SITE_OTHER): Payer: Medicare Other | Admitting: *Deleted

## 2015-11-10 DIAGNOSIS — I4891 Unspecified atrial fibrillation: Secondary | ICD-10-CM

## 2015-11-10 DIAGNOSIS — I480 Paroxysmal atrial fibrillation: Secondary | ICD-10-CM

## 2015-11-10 DIAGNOSIS — Z5181 Encounter for therapeutic drug level monitoring: Secondary | ICD-10-CM | POA: Diagnosis not present

## 2015-11-10 LAB — POCT INR: INR: 2.9

## 2015-12-01 DIAGNOSIS — D239 Other benign neoplasm of skin, unspecified: Secondary | ICD-10-CM | POA: Diagnosis not present

## 2015-12-01 DIAGNOSIS — L304 Erythema intertrigo: Secondary | ICD-10-CM | POA: Diagnosis not present

## 2015-12-02 DIAGNOSIS — H43811 Vitreous degeneration, right eye: Secondary | ICD-10-CM | POA: Diagnosis not present

## 2015-12-02 DIAGNOSIS — H04123 Dry eye syndrome of bilateral lacrimal glands: Secondary | ICD-10-CM | POA: Diagnosis not present

## 2015-12-02 DIAGNOSIS — H401131 Primary open-angle glaucoma, bilateral, mild stage: Secondary | ICD-10-CM | POA: Diagnosis not present

## 2015-12-22 ENCOUNTER — Ambulatory Visit (INDEPENDENT_AMBULATORY_CARE_PROVIDER_SITE_OTHER): Payer: Medicare Other

## 2015-12-22 DIAGNOSIS — I480 Paroxysmal atrial fibrillation: Secondary | ICD-10-CM

## 2015-12-22 DIAGNOSIS — Z5181 Encounter for therapeutic drug level monitoring: Secondary | ICD-10-CM | POA: Diagnosis not present

## 2015-12-22 DIAGNOSIS — I4891 Unspecified atrial fibrillation: Secondary | ICD-10-CM | POA: Diagnosis not present

## 2015-12-22 LAB — POCT INR: INR: 2.4

## 2016-01-13 DIAGNOSIS — G20 Parkinson's disease: Secondary | ICD-10-CM | POA: Diagnosis not present

## 2016-01-13 DIAGNOSIS — G629 Polyneuropathy, unspecified: Secondary | ICD-10-CM | POA: Diagnosis not present

## 2016-01-13 DIAGNOSIS — K219 Gastro-esophageal reflux disease without esophagitis: Secondary | ICD-10-CM | POA: Diagnosis not present

## 2016-01-13 DIAGNOSIS — R7302 Impaired glucose tolerance (oral): Secondary | ICD-10-CM | POA: Diagnosis not present

## 2016-01-13 DIAGNOSIS — Z0000 Encounter for general adult medical examination without abnormal findings: Secondary | ICD-10-CM | POA: Diagnosis not present

## 2016-01-13 DIAGNOSIS — E7800 Pure hypercholesterolemia, unspecified: Secondary | ICD-10-CM | POA: Diagnosis not present

## 2016-01-13 DIAGNOSIS — G2581 Restless legs syndrome: Secondary | ICD-10-CM | POA: Diagnosis not present

## 2016-01-13 DIAGNOSIS — I129 Hypertensive chronic kidney disease with stage 1 through stage 4 chronic kidney disease, or unspecified chronic kidney disease: Secondary | ICD-10-CM | POA: Diagnosis not present

## 2016-01-13 DIAGNOSIS — H409 Unspecified glaucoma: Secondary | ICD-10-CM | POA: Diagnosis not present

## 2016-01-13 DIAGNOSIS — N183 Chronic kidney disease, stage 3 (moderate): Secondary | ICD-10-CM | POA: Diagnosis not present

## 2016-01-14 ENCOUNTER — Encounter: Payer: Self-pay | Admitting: Cardiovascular Disease

## 2016-01-21 DIAGNOSIS — M533 Sacrococcygeal disorders, not elsewhere classified: Secondary | ICD-10-CM | POA: Diagnosis not present

## 2016-01-21 DIAGNOSIS — I10 Essential (primary) hypertension: Secondary | ICD-10-CM | POA: Diagnosis not present

## 2016-01-21 DIAGNOSIS — Z6834 Body mass index (BMI) 34.0-34.9, adult: Secondary | ICD-10-CM | POA: Diagnosis not present

## 2016-02-02 ENCOUNTER — Ambulatory Visit (INDEPENDENT_AMBULATORY_CARE_PROVIDER_SITE_OTHER): Payer: Medicare Other | Admitting: *Deleted

## 2016-02-02 DIAGNOSIS — I4891 Unspecified atrial fibrillation: Secondary | ICD-10-CM | POA: Diagnosis not present

## 2016-02-02 DIAGNOSIS — Z5181 Encounter for therapeutic drug level monitoring: Secondary | ICD-10-CM | POA: Diagnosis not present

## 2016-02-02 LAB — POCT INR: INR: 2.5

## 2016-02-05 DIAGNOSIS — E876 Hypokalemia: Secondary | ICD-10-CM | POA: Diagnosis not present

## 2016-02-16 ENCOUNTER — Encounter: Payer: Self-pay | Admitting: Neurology

## 2016-02-16 ENCOUNTER — Ambulatory Visit (INDEPENDENT_AMBULATORY_CARE_PROVIDER_SITE_OTHER): Payer: Medicare Other | Admitting: Neurology

## 2016-02-16 VITALS — BP 125/66 | HR 66 | Ht 66.0 in | Wt 205.0 lb

## 2016-02-16 DIAGNOSIS — G20 Parkinson's disease: Secondary | ICD-10-CM | POA: Diagnosis not present

## 2016-02-16 MED ORDER — CARBIDOPA-LEVODOPA 25-100 MG PO TABS: 1.5000 | ORAL_TABLET | Freq: Four times a day (QID) | ORAL | 1 refills | Status: DC

## 2016-02-16 NOTE — Progress Notes (Signed)
[facility]    Provider:  Dr [name] Referring Provider: [name], MD Primary Care Physician:  Simona Huh, MD  CC:  Parkinsonism  Interval history:  75 year old right-handed woman with an underlying history of CHF, PAF, hypertension, allergic rhinitis, hyperlipidemia, lumbar spinal stenosis, scoliosis, history of small bowel obstruction, reflux disease, OSA, degenerative spine disease, arthritis, chronic back pain, status post nasal septum surgery, hysterectomy, lumbar spine surgery, repair for CSF leak, and cataract extractions, who was noted to have parkinsonism in 2015. Since being here she has been seen by [place] [facility] and no interventions at this time on her low back. She is going to have some shots. She has gained a lot of weight. She can get up but when she starts walking she bends over. She has chronic low back pain. The walker helps her stand up straighter. She is not freezing as much when walking since starting the sinemet. She went down on her knees recently but no falls; She had her arms full of folders and she dropped one and couldn't get back up. Advised not to bend over in situations like this, not to have her hands full and to always use walking aids, she is at risk for falls due to imbalance. She has had multiple low back surgeries in 2010, 2012 and 2013. Memory is fine.   HPI Dr [name] second opinion:  I saw your patient, [name], upon your kind request in my clinic today for second opinion of her parkinsonism. The patient is accompanied by her husband today. As you know, [name] is a 75 year old right-handed woman with an underlying history of CHF, PAF, hypertension, allergic rhinitis, hyperlipidemia, lumbar spinal stenosis, scoliosis, history of small bowel obstruction, reflux disease, OSA, degenerative spine disease, arthritis, chronic back pain, status post nasal septum surgery, hysterectomy, lumbar spine surgery, repair for  CSF leak, and cataract extractions, who was noted to have parkinsonism in 2015. She was placed on a trial of Sinemet in early 2015 and felt it helped with slowness, her gait difficulty, including freezing episodes. She has a history of dream enactments including talking in her sleep, flailing movements in her sleep and she has a history of poor balance and handwriting changes. I reviewed your office note from 06/24/2015. At that time she requested to be tapered off of Sinemet. She had increase it to up to 1-1/2 pills 4 times a day. She was also on Azilect. You tapered her off of her medications.  Of note, she is off of Azilect and reduced her Sinemet as instructed to 1 pill 3 times a day. She did not notice any significant repercussions after stopping the Azilect, which was too expensive for her and after reducing the Sinemet. She tolerates the medication. Her highest dose was indeed 1-1/2 pills 4 times a day for the Sinemet.  She had a recent fall at the house. She admits that she was trying to throw something onto her bed and move out of the room quickly and she fell inside the bedroom. Thankfully, she did not injure herself. No loss of consciousness.  She had a brain MRI without contrast on 11/28/2013:  IMPRESSION:  Equivocal MRI brain (without) demonstrating: 1. Few punctate foci of non-specific gliosis in the periventricular and subcortical white matter. 2. Mild frontal atrophy.  3. No acute findings.   She had a CTH wo conctrast on 10/12/13: IMPRESSION: No acute intracranial abnormality. Chronic and involutional changes Appreciated.  She has very occasional calling out in her dreams,  has had more stress, both sisters don't help very much with her mother's house sale. Memory and mood have been good, denies any hallucinations, sleeps fairly well. Husband is a heart transplant pt and has a LHC at [facility] soon.  Patient has 2 grown daughters, both in the area and one grown GD.   She has no  family history of Parkinson's disease.  Dr [name] 06/24/2015: [name] is a 75 y.o. female here as a referral from Dr. Marisue Humble for parkinsonism. She has past medical history of congestive heart failure, hypertension, hyperlipidemia, lumbar stenosis and scoliosis with radiculopathy, obstructive sleep apnea, degenerative joint disease, arthritis, chronic back pain.  Patient was noticed to have parkinsonian symptoms on exam. Sinemet was trialed over a year ago with reported benefits. She was on 1.5 tablets 4x a day.  Today patient and husband question whether Sinemet works, they don't remember is they benefited or not. Discussed, decided to stop the medication at their request and see if there is a difference. Her voice has not gotten softer over the years. Writing had gotten smaller but improved since starting the medication the Sinemet and Azilect. She talks in her sleep, she hollars in her sleep. She acts out the dreams and thrashes around, knocked husband out of bed once. She drools. She has incontinence. She feels like her balance is poor with near falls, she uses a walker. She has a hard time getting out of the tub and getting out of low seats. She used to freeze and she could not walk and she couldn't turn well but that has gotten better. She has some difficulty swallowing. She has trouble lifting arms and getting her coat on. Memory is fine. No hallucinations.    Social History   Social History  . Marital status: Married    Spouse name: [name]  . Number of children: 2  . Years of education: 12th    Occupational History  . retired Retired    Radio producer work   Social History Main Topics  . Smoking status: Never Smoker  . Smokeless tobacco: Never Used  . Alcohol use No     Comment: 02/01/2014 " glasseof wine once in a blue moon; < once/year"  . Drug use:     Types: Nitrous oxide  . Sexual activity: No     Comment: hysterectomy   Other Topics Concern  . Not on file   Social  History Narrative   Patient lives at home with husband [name].   Patient is retired.   Patient has a high school education.    Patient has 2 children     Family History  Problem Relation Age of Onset  . Arthritis Mother   . Heart attack Father   . Hypertension Sister   . Asthma Sister   . Allergies Sister     Past Medical History:  Diagnosis Date  . Allergic rhinitis   . Arthritis    "all over"  . CHF (congestive heart failure) ([place])   . Chronic back pain   . DJD (degenerative joint disease)   . GERD (gastroesophageal reflux disease)   . Hyperlipidemia   . Hypertension   . Lumbar stenosis   . OSA (obstructive sleep apnea)    "couldn't handle CPAP; use mouth guard some; not all the time" (01/06/2014)  . PAF (paroxysmal atrial fibrillation) ([place])   . Scoliosis    with radiculopathy L2-S1 with prior surgery  . Small bowel obstruction ([facility])    versus  ileus after last bck surgery  . Spondylosis     Past Surgical History:  Procedure Laterality Date  . BACK SURGERY    . CATARACT EXTRACTION W/ INTRAOCULAR LENS  IMPLANT, BILATERAL  2011  . COLONOSCOPY  12/09/2004  . LIPOMA EXCISION  1980's   "fatty tumors"  . [place] SURGERY  02/2009   "ruptured disc"  . NASAL SEPTUM SURGERY  80's  . POSTERIOR LUMBAR FUSION  06/2010; 10/2011   "placed screws, rods, spacers both times"  . REPAIR DURAL / CSF LEAK  02/2009  . TOTAL ABDOMINAL HYSTERECTOMY  02/1993    Current Outpatient Prescriptions  Medication Sig Dispense Refill  . acetaminophen (TYLENOL) 500 MG tablet Take 1,000 mg by mouth daily as needed (pain).    . calcium carbonate (TUMS EX) 750 MG chewable tablet Chew 2 tablets by mouth at bedtime.    . carbidopa-levodopa (SINEMET IR) 25-100 MG tablet Take 1 tablet by mouth 3 (three) times daily. 630 tablet 1  . carboxymethylcellulose (REFRESH PLUS) 0.5 % SOLN 1 drop 3 (three) times daily as needed. Patient takes 2-4 times daily- Retaine eye drops    . EPINEPHrine (EPIPEN 2-PAK) 0.3  mg/0.3 mL IJ SOAJ injection Inject 0.3 mg into the muscle as needed (allergic reaction). Inject into the thigh    . fexofenadine (ALLEGRA) 180 MG tablet Take 180 mg by mouth daily. For allergies    . flecainide (TAMBOCOR) 50 MG tablet Take 1 tablet (50 mg total) by mouth 2 (two) times daily. 180 tablet 1  . furosemide (LASIX) 20 MG tablet Take 20 mg by mouth daily.     Marland Kitchen gabapentin (NEURONTIN) 300 MG capsule Take 300 mg by mouth 2 (two) times daily.    . Magnesium 400 MG TABS Take 400 mg by mouth at bedtime.    . metolazone (ZAROXOLYN) 5 MG tablet Take 5 mg by mouth daily.    . metoprolol succinate (TOPROL-XL) 100 MG 24 hr tablet Take 1 tablet (100 mg total) by mouth daily. 90 tablet 3  . potassium chloride SA (K-DUR,KLOR-CON) 20 MEQ tablet Take 20 mEq by mouth daily.    . pramipexole (MIRAPEX) 0.5 MG tablet Take 0.5 mg by mouth at bedtime. For restless legs    . pravastatin (PRAVACHOL) 40 MG tablet Take 40 mg by mouth at bedtime.     . TRAVATAN Z 0.004 % SOLN ophthalmic solution   0  . UNABLE TO FIND Inject 1 Dose into the muscle. Every other week- Allergy Shots    . UNABLE TO FIND Take 1 Dose by mouth daily. Med Name: Omega 3 krill oil 350mg     . warfarin (COUMADIN) 5 MG tablet Take as directed by coumadin clinic 60 tablet 1   No current facility-administered medications for this visit.     Allergies as of 02/16/2016 - Review Complete 10/16/2015  Allergen Reaction Noted  . Lyrica [pregabalin] Other (See Comments) 01/31/2014  . Zocor [simvastatin] Other (See Comments) 04/17/2012  . Relafen [nabumetone] Rash 04/17/2012    Vitals: BP 125/66 (BP Location: Right Arm, Patient Position: Sitting, Cuff Size: Large)   Pulse 66   Ht 5\' 6"  (1.676 m)   Wt 205 lb (93 kg)   BMI 33.09 kg/m  Last Weight:  Wt Readings from Last 1 Encounters:  02/16/16 205 lb (93 kg)   Last Height:   Ht Readings from Last 1 Encounters:  02/16/16 5\' 6"  (1.676 m)      Speech: normal comprehension, fluent  Coordination:  finger to nose intact but slow, cannot perform HTS due to body habitus.She has fine motor difficulty with finger taps, hand movements, rapid alternating patting.  Gait:  Shuffling, slow initially but on today's exam better stride with walker. Decreased arm arm swing bilaterally, stooped, Stooped. Impaired balance.   Motor Observation: Masked Facies  no involuntary movements noted. No resting tremor.  Tone:  Mild Increased tone uppers with facilitation which improved on sinemet. No cogwheeling.   Posture:  stooped   Strength:  Left DF 3+/5, otherwise Strength is V/V in the upper and lower limbs.    Assessment/Plan: 75 year old female here as a follow up for Parkinsonism.  Exam shows shuffling gait(improved on sinemet) with stooped posture, masked facies, hypophonia and hypographia, mild increased tone in the upper extremities, masked facies. No tremor. No history of Dopa blocking drugs. Parkinsonian features, responding to Dopaminergic drugs, improved freezing per patient.  no significant memory loss, no significant mood disorder. 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 She is advised to continue with Sinemet  1-1/2 pills 4 times a day, at 8, 12, 4 PM and 8 PM.  Sarina Ill, MD  [facility] [address] [place], [place] [address]  Phone [phone] Fax [phone]  A total of 30 minutes was spent face-to-face with this patient. Over half this time was spent on counseling patient on the Parkinsonism diagnosis and different diagnostic and therapeutic options available.

## 2016-02-16 NOTE — Patient Instructions (Signed)
Remember to drink plenty of fluid, eat healthy meals and do not skip any meals. Try to eat protein with a every meal and eat a healthy snack such as fruit or nuts in between meals. Try to keep a regular sleep-wake schedule and try to exercise daily, particularly in the form of walking, 20-30 minutes a day, if you can.   As far as your medications are concerned, I would like to suggest: continue Carbidopa/Levodopa  I would like to see you back in 9 months, sooner if we need to. Please call us with any interim questions, concerns, problems, updates or refill requests.   Our phone number is [phone]. We also have an after hours call service for urgent matters and there is a physician on-call for urgent questions. For any emergencies you know to call 911 or go to the nearest emergency room

## 2016-02-25 ENCOUNTER — Encounter: Payer: Self-pay | Admitting: Cardiovascular Disease

## 2016-02-27 DIAGNOSIS — Z23 Encounter for immunization: Secondary | ICD-10-CM | POA: Diagnosis not present

## 2016-03-04 ENCOUNTER — Other Ambulatory Visit: Payer: Self-pay | Admitting: Cardiovascular Disease

## 2016-03-04 MED ORDER — FLECAINIDE ACETATE 50 MG PO TABS: 50.0000 mg | ORAL_TABLET | Freq: Two times a day (BID) | ORAL | 1 refills | Status: DC

## 2016-03-08 NOTE — Progress Notes (Signed)
Patient ID: [name], female   DOB: 1940-10-20, 75 y.o.   MRN: [id number]    Cardiology Office Note   Date:  03/10/2016   ID:  [name], DOB [birth year], MRN [id number]  PCP:  Simona Huh, MD  Cardiologist:  [name]    Chief Complaint  Patient presents with  . Atrial Fibrillation  . Medication Management      History of Present Illness: [name] is a 75 y.o. female who presents for follow up of HTN and PAF.  She is on coumadin followed by our office.  She has hx of a heart catheterization in Feb. 2002 that demonstrated normal coronary arteries and normal LV function. A Myoview study done in July 2009 demonstrated no ischemia. An echocardiogram done in July 2012 demonstrated normal systolic function with an EF of 55-60%.   Echo 2015 revealing normal LV function with an EF of 60-65%. Wall motion was normal; there were no regional wall motion abnormalities. The left atrium was normal in size.    Myovue 9/15 normal EF no ischemia  Flecainide added 04/03/14.  Is followed by Neuro for parkinson's.  Has been fitted for a brace on lt leg.  Uses a walker to ambulate.  No chest pain, no SOB.  No awareness of palpitations.  Her husband also a patient of mine  Had a heart transplant at [facility] years ago   Having lots of back pain Being referred to pain doctor Told her it is fine to hold coumadin for 4 days with no lovenox bridge  Would like 2nd opinion about Parkinson's diagnosis as she indicated that Dr Lavell Anchors is not Sure what is going on with her    Past Medical History:  Diagnosis Date  . Allergic rhinitis   . Arthritis    "all over"  . CHF (congestive heart failure) ([place])   . Chronic back pain   . DJD (degenerative joint disease)   . GERD (gastroesophageal reflux disease)   . Hyperlipidemia   . Hypertension   . Lumbar stenosis   . OSA (obstructive sleep apnea)    "couldn't handle CPAP; use mouth guard some; not all the time" (01/06/2014)  .  PAF (paroxysmal atrial fibrillation) ([place])   . Scoliosis    with radiculopathy L2-S1 with prior surgery  . Small bowel obstruction    versus ileus after last bck surgery  . Spondylosis     Past Surgical History:  Procedure Laterality Date  . BACK SURGERY    . CATARACT EXTRACTION W/ INTRAOCULAR LENS  IMPLANT, BILATERAL  2011  . COLONOSCOPY  12/09/2004  . LIPOMA EXCISION  1980's   "fatty tumors"  . [place] SURGERY  02/2009   "ruptured disc"  . NASAL SEPTUM SURGERY  80's  . POSTERIOR LUMBAR FUSION  06/2010; 10/2011   "placed screws, rods, spacers both times"  . REPAIR DURAL / CSF LEAK  02/2009  . TOTAL ABDOMINAL HYSTERECTOMY  02/1993     Current Outpatient Prescriptions  Medication Sig Dispense Refill  . acetaminophen (TYLENOL) 500 MG tablet Take 1,000 mg by mouth daily as needed (pain).    . calcium carbonate (TUMS EX) 750 MG chewable tablet Chew 2 tablets by mouth at bedtime.    . carbidopa-levodopa (SINEMET IR) 25-100 MG tablet Take 1.5 tablets by mouth 4 (four) times daily. 630 tablet 1  . carboxymethylcellulose (REFRESH PLUS) 0.5 % SOLN 1 drop 3 (three) times daily as needed. Patient takes 2-4 times daily- Retaine eye drops    .  EPINEPHrine (EPIPEN 2-PAK) 0.3 mg/0.3 mL IJ SOAJ injection Inject 0.3 mg into the muscle as needed (allergic reaction). Inject into the thigh    . fexofenadine (ALLEGRA) 180 MG tablet Take 180 mg by mouth as needed. For allergies    . flecainide (TAMBOCOR) 50 MG tablet Take 1 tablet (50 mg total) by mouth 2 (two) times daily. 180 tablet 1  . furosemide (LASIX) 20 MG tablet Take 20 mg by mouth 2 (two) times daily.     Marland Kitchen gabapentin (NEURONTIN) 300 MG capsule Take 300 mg by mouth 2 (two) times daily.    . Magnesium 400 MG TABS Take 400 mg by mouth at bedtime.    Marland Kitchen MEGARED OMEGA-3 KRILL OIL 500 MG CAPS Take 500 mg by mouth daily.    . metolazone (ZAROXOLYN) 5 MG tablet Take 5 mg by mouth daily.    . metoprolol succinate (TOPROL-XL) 100 MG 24 hr tablet Take 1  tablet (100 mg total) by mouth daily. 90 tablet 3  . potassium chloride SA (K-DUR,KLOR-CON) 20 MEQ tablet Take 40 mEq by mouth daily.     . pramipexole (MIRAPEX) 0.5 MG tablet Take 0.5 mg by mouth at bedtime. For restless legs    . pravastatin (PRAVACHOL) 40 MG tablet Take 40 mg by mouth at bedtime.     . TRAVATAN Z 0.004 % SOLN ophthalmic solution   0  . UNABLE TO FIND Inject 1 Dose into the muscle. Every other week- Allergy Shots    . warfarin (COUMADIN) 5 MG tablet Take as directed by coumadin clinic 60 tablet 1   No current facility-administered medications for this visit.     Allergies:   Lyrica [pregabalin]; Zocor [simvastatin]; and Relafen [nabumetone]    Social History:  The patient  reports that she has never smoked. She has never used smokeless tobacco. She reports that she uses drugs, including Nitrous oxide. She reports that she does not drink alcohol.   Family History:  The patient's family history includes Allergies in her sister; Arthritis in her mother; Asthma in her sister; Heart attack in her father; Hypertension in her sister.    ROS:  General:no colds or fevers, incr. of weight - not exercising Skin:no rashes or ulcers HEENT:no blurred vision, no congestion CV:see HPI PUL:see HPI GI:no diarrhea constipation or melena, no indigestion GU:no hematuria, no dysuria- for routine colonoscopy next week MS:no joint pain, no claudication- getting a brace for Lt leg, using a walker Neuro:no syncope, no lightheadedness Endo:no diabetes, no thyroid disease  Wt Readings from Last 3 Encounters:  03/10/16 93.4 kg (206 lb)  02/16/16 93 kg (205 lb)  10/16/15 94.3 kg (208 lb)     PHYSICAL EXAM: VS:  BP 122/60   Pulse 69   Ht 5\' 6"  (1.676 m)   Wt 93.4 kg (206 lb)   SpO2 96%   BMI 33.25 kg/m  , BMI Body mass index is 33.25 kg/m. General:Pleasant affect, NAD Skin:Warm and dry, brisk capillary refill HEENT:normocephalic, sclera clear, mucus membranes moist Neck:supple, no  JVD, no bruits  Heart:S1S2 RRR without murmur, gallup, rub or click Lungs:clear without rales, rhonchi, or wheezes [id number], non tender, + BS, do not palpate liver spleen or masses Ext:no lower ext edema, 2+ pedal pulses, 2+ radial pulses Neuro:masked facies broad based gait stiffness in legs/arms Parkinson's     EKG:   03/24/15 SR with PAC poor R wave progression , no acute changes otherwise.  03/10/16 SR rate 67 nonspecific ST changes   Recent  Labs: No results found for requested labs within last 8760 hours.    Lipid Panel    Component Value Date/Time   CHOL  12/22/2006 0550    183        ATP III CLASSIFICATION:  <200     mg/dL   Desirable  200-239  mg/dL   Borderline High  >=240    mg/dL   High   TRIG 174 (H) 12/22/2006 0550   HDL 47 12/22/2006 0550   CHOLHDL 3.9 12/22/2006 0550   VLDL 35 12/22/2006 0550   LDLCALC (H) 12/22/2006 0550    101        Total Cholesterol/HDL:CHD Risk Coronary Heart Disease Risk Table                     Men   Women  1/2 Average Risk   3.4   3.3       Other studies Reviewed: Additional studies/ records that were reviewed today include: previous notes and labs.   ASSESSMENT AND PLAN:  1.  PAF- maintaining SR.  On flecainide.  Anticoagulation on coumadin, with INR RX  Seeing clinic today  2. HTN  Home readings fine some white coat component does best with diuretic on board  3.  Hyperlipidemia checked with PCP in August with T chol 150, LDL 70, HDL 56.  She will continue with pravachol.  4. Parkinson's  Will refer to Dr Tat for 2nd opinion .  Concern that some of her leg weakness and dificulty Walking is from chronic back problems. Had surgeries with Dr Luiz Ochoa in past.    Current medicines are reviewed with the patient today.  The patient Has no concerns regarding medicines.   Signed, Jenkins Rouge, MD  03/10/2016 10:11 AM    [facility] [place], [address] [place], [place] Phone: [phone]; Fax: [phone]

## 2016-03-10 ENCOUNTER — Ambulatory Visit (INDEPENDENT_AMBULATORY_CARE_PROVIDER_SITE_OTHER): Payer: Medicare Other | Admitting: Pharmacist

## 2016-03-10 ENCOUNTER — Encounter: Payer: Self-pay | Admitting: Cardiovascular Disease

## 2016-03-10 ENCOUNTER — Ambulatory Visit (INDEPENDENT_AMBULATORY_CARE_PROVIDER_SITE_OTHER): Payer: Medicare Other | Admitting: Cardiovascular Disease

## 2016-03-10 VITALS — BP 122/60 | HR 69 | Ht 66.0 in | Wt 206.0 lb

## 2016-03-10 DIAGNOSIS — I4891 Unspecified atrial fibrillation: Secondary | ICD-10-CM | POA: Diagnosis not present

## 2016-03-10 DIAGNOSIS — G20 Parkinson's disease: Secondary | ICD-10-CM | POA: Diagnosis not present

## 2016-03-10 DIAGNOSIS — Z5181 Encounter for therapeutic drug level monitoring: Secondary | ICD-10-CM

## 2016-03-10 LAB — POCT INR: INR: 2.8

## 2016-03-10 NOTE — Patient Instructions (Addendum)
Medication Instructions:  Your physician recommends that you continue on your current medications as directed. Please refer to the Current Medication list given to you today.  Lab work: NONE  Testing/Procedures: NONE  Follow-Up: Your physician wants you to follow-up in: 6 months with [name]. You will receive a reminder letter in the mail two months in advance. If you don't receive a letter, please call our office to schedule the follow-up appointment.  You have been referred to Dr. Carles Collet due to parkinson's diagnosis.   If you need a refill on your cardiac medications before your next appointment, please call your pharmacy.

## 2016-03-11 ENCOUNTER — Telehealth: Payer: Self-pay | Admitting: Pharmacist

## 2016-03-11 NOTE — Telephone Encounter (Signed)
Received fax from Dr Maryjean Ka with [facility] that pt is having SI joint injections on 10/12 with request to hold Coumadin x5 days. Pt takes Coumadin for afib, CHADS2 score of 2 (CHF and HTN). Ok to hold Coumadin x5 days, pt is already aware of instructions.

## 2016-03-18 ENCOUNTER — Ambulatory Visit (INDEPENDENT_AMBULATORY_CARE_PROVIDER_SITE_OTHER): Payer: Medicare Other

## 2016-03-18 DIAGNOSIS — I4891 Unspecified atrial fibrillation: Secondary | ICD-10-CM

## 2016-03-18 DIAGNOSIS — Z5181 Encounter for therapeutic drug level monitoring: Secondary | ICD-10-CM | POA: Diagnosis not present

## 2016-03-18 DIAGNOSIS — M533 Sacrococcygeal disorders, not elsewhere classified: Secondary | ICD-10-CM | POA: Diagnosis not present

## 2016-03-18 LAB — POCT INR: INR: 1.2

## 2016-03-19 ENCOUNTER — Other Ambulatory Visit: Payer: Self-pay

## 2016-03-19 MED ORDER — WARFARIN SODIUM 5 MG PO TABS: ORAL_TABLET | ORAL | 1 refills | Status: DC

## 2016-03-26 ENCOUNTER — Ambulatory Visit (INDEPENDENT_AMBULATORY_CARE_PROVIDER_SITE_OTHER): Payer: Medicare Other | Admitting: Pharmacist

## 2016-03-26 DIAGNOSIS — I4891 Unspecified atrial fibrillation: Secondary | ICD-10-CM | POA: Diagnosis not present

## 2016-03-26 DIAGNOSIS — Z5181 Encounter for therapeutic drug level monitoring: Secondary | ICD-10-CM | POA: Diagnosis not present

## 2016-03-26 LAB — POCT INR: INR: 1.4

## 2016-04-02 ENCOUNTER — Ambulatory Visit (INDEPENDENT_AMBULATORY_CARE_PROVIDER_SITE_OTHER): Payer: Medicare Other | Admitting: *Deleted

## 2016-04-02 DIAGNOSIS — I4891 Unspecified atrial fibrillation: Secondary | ICD-10-CM | POA: Diagnosis not present

## 2016-04-02 DIAGNOSIS — Z5181 Encounter for therapeutic drug level monitoring: Secondary | ICD-10-CM

## 2016-04-02 LAB — POCT INR: INR: 2

## 2016-04-12 DIAGNOSIS — M47816 Spondylosis without myelopathy or radiculopathy, lumbar region: Secondary | ICD-10-CM | POA: Diagnosis not present

## 2016-04-12 DIAGNOSIS — M533 Sacrococcygeal disorders, not elsewhere classified: Secondary | ICD-10-CM | POA: Diagnosis not present

## 2016-04-12 DIAGNOSIS — Z6833 Body mass index (BMI) 33.0-33.9, adult: Secondary | ICD-10-CM | POA: Diagnosis not present

## 2016-04-13 DIAGNOSIS — H532 Diplopia: Secondary | ICD-10-CM | POA: Diagnosis not present

## 2016-04-13 DIAGNOSIS — H401131 Primary open-angle glaucoma, bilateral, mild stage: Secondary | ICD-10-CM | POA: Diagnosis not present

## 2016-04-13 DIAGNOSIS — H43811 Vitreous degeneration, right eye: Secondary | ICD-10-CM | POA: Diagnosis not present

## 2016-04-13 DIAGNOSIS — H524 Presbyopia: Secondary | ICD-10-CM | POA: Diagnosis not present

## 2016-04-13 DIAGNOSIS — H04123 Dry eye syndrome of bilateral lacrimal glands: Secondary | ICD-10-CM | POA: Diagnosis not present

## 2016-04-13 DIAGNOSIS — H52223 Regular astigmatism, bilateral: Secondary | ICD-10-CM | POA: Diagnosis not present

## 2016-04-15 DIAGNOSIS — H532 Diplopia: Secondary | ICD-10-CM

## 2016-04-15 DIAGNOSIS — G20 Parkinson's disease: Secondary | ICD-10-CM

## 2016-04-15 NOTE — Progress Notes (Signed)
[name] was seen today in the movement disorders clinic for neurologic consultation at the request of [name].  Her PCP is Simona Huh, MD.  The consultation is for the evaluation of PD.  This patient is accompanied in the office by her spouse who supplements the history.  I have reviewed prior records made available to me.  Extensive record review was done on 04/15/2016 and took 45 minutes.  The patient has seen multiple physicians at [facility] neurology, including Dr. [name] (only for EMG), Dr. [name], Dr. [name] and Dr. [name].  The patient was first seen by Dr. [name] in 2014 and subsequently had an EMG by Dr. [name].  The primary complaint at that point was knee pain and she was diagnosed with a painful peripheral neuropathy.  In June, 2015 she followed up with Dr. [name] and was complaining about dizziness and gait instability.  Dr. [name] felt that perhaps this was due to cervical degenerative changes.  When she came back for follow-up, Dr. [name] had left the practice and Dr. [name] had resumed her care.  Dr. [name] felt that her symptoms were consistent with parkinsonism and she was started on levodopa in December, 2015.  She was started on half a tablet 3 times per day.  Initial follow-up notes more month later indicate that the patient thought that the medication was helpful.  In February, 2016 she was started on Azilect but it was expensive and she thought that it caused swelling and so pt d/c it.  However, once she d/c she thought that she got worse so she restarted it.  In July, 2016 it was attempted to discontinue the levodopa and try her on Neupro patch.  The patient felt that she did worse with this and she stopped patch.  The patient saw Dr. [name] in May, 2017 for a second opinion.  Dr. [name] stated "she does not give any sinister atypical history for atypical parkinsonism but does not have a very strong history or telltale exam for idiopathic Parkinson's disease either.  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   Specific Symptoms:  Tremor: No. Family hx of similar:  No. Voice: patients husband thinks that voice is same; pt thinks that it is deeper Sleep: patient states that she doesn't sleep well (but states that it is because of husbands electric blanket makes her hot)  Vivid Dreams:  Yes.    Acting out dreams:  Yes.  , screams out x several years but no falling out of the bed Wet Pillows: Yes.   Postural symptoms:  Yes.   x 3-4 years and thinks that perhaps due to back surgeries.  Used walker x several years  Falls? Only one time in march 2017 - trying to put something on bed and fell; few other times bent down and "eased down" to floor (for example at file cabinet) Bradykinesia symptoms: slow movements, difficulty getting out of a chair and small steps Loss of smell:  No. Loss of taste:  No. Urinary Incontinence:  Yes.   x 1 year (cannot get to bathroom fast enough) Difficulty Swallowing:  :not really" Handwriting, micrographia: Yes.   Trouble with ADL's:  Yes.   but better than she could (trouble with socks)  Trouble buttoning clothing: No. Depression:  No. Memory changes:  No. Hallucinations:  No.  visual distortions: No.,  but does have some floaters and sees Dr. [name] N/V:  No. Lightheaded:  No.  Syncope: No. Diplopia:  Yes.   x 3-4 months (horizontal, goes away if closes eye, comes and goes, doesn't know if time of day, doesn't know if better if after nap; sometimes gets told to open L eye wider but not sure if has ptosis) Dyskinesia:  No.  Neuroimaging has previously been performed.  It is available for my review today.  I reviewed the patient's MRI of the brain dated 11/28/2013.  There was mild atrophy and a few T2 hyperintensities.  PREVIOUS MEDICATIONS: Sinemet, Mirapex and  neupro  ALLERGIES:   Allergies  Allergen Reactions  . Lyrica [Pregabalin] Other (See Comments)    Felt loopy  . Zocor [Simvastatin] Other (See Comments)    Myalgias  . Relafen [Nabumetone] Rash    CURRENT MEDICATIONS:  Outpatient Encounter Prescriptions as of 04/16/2016  Medication Sig  . acetaminophen (TYLENOL) 500 MG tablet Take 1,000 mg by mouth daily as needed (pain).  . carbidopa-levodopa (SINEMET IR) 25-100 MG tablet Take 1.5 tablets by mouth 4 (four) times daily.  . fexofenadine (ALLEGRA) 180 MG tablet Take 180 mg by mouth as needed. For allergies  . flecainide (TAMBOCOR) 50 MG tablet Take 1 tablet (50 mg total) by mouth 2 (two) times daily.  . furosemide (LASIX) 20 MG tablet Take 20 mg by mouth 2 (two) times daily.   . Magnesium 400 MG TABS Take 400 mg by mouth at bedtime.  Marland Kitchen MEGARED OMEGA-3 KRILL OIL 500 MG CAPS Take 500 mg by mouth daily.  . metolazone (ZAROXOLYN) 5 MG tablet Take 5 mg by mouth daily.  . metoprolol succinate (TOPROL-XL) 100 MG 24 hr tablet Take 1 tablet (100 mg total) by mouth daily.  . potassium chloride SA (K-DUR,KLOR-CON) 20 MEQ tablet Take 40 mEq by mouth daily.   . pramipexole (MIRAPEX) 0.5 MG tablet Take 0.5 mg by mouth at bedtime. For restless legs  . pravastatin (PRAVACHOL) 40 MG tablet Take 40 mg by mouth at bedtime.   . TRAVATAN Z 0.004 % SOLN ophthalmic solution   . warfarin (COUMADIN) 5 MG tablet Take as directed by coumadin clinic (Patient taking differently: Take 2.5 mg by mouth daily. Take as directed by coumadin clinic)  . EPINEPHrine (EPIPEN 2-PAK) 0.3 mg/0.3 mL IJ SOAJ injection Inject 0.3 mg into the muscle as needed (allergic reaction). Inject into the thigh  . [DISCONTINUED] calcium carbonate (TUMS EX) 750 MG chewable tablet Chew 2 tablets by mouth at bedtime.  . [DISCONTINUED] carboxymethylcellulose (REFRESH PLUS) 0.5 % SOLN 1 drop 3 (three) times daily as needed. Patient takes 2-4 times daily- Retaine eye drops  . [DISCONTINUED]  gabapentin (NEURONTIN) 300 MG capsule Take 300 mg by mouth 2 (two) times daily.  . [DISCONTINUED] UNABLE TO FIND Inject 1 Dose into the muscle. Every other week- Allergy Shots   No facility-administered encounter medications on file as of 04/16/2016.     PAST MEDICAL HISTORY:   Past Medical History:  Diagnosis Date  . Allergic rhinitis   . Arthritis    "all over"  . CHF (congestive heart failure) ([place])   . Chronic back pain   . DJD (degenerative joint disease)   . GERD (gastroesophageal reflux disease)   . Hyperlipidemia   . Hypertension   . Lumbar stenosis   . OSA (obstructive sleep apnea)    "couldn't handle CPAP; use mouth guard some; not all the time" (01/06/2014)  . PAF (paroxysmal atrial fibrillation) (  [place])   . Scoliosis    with radiculopathy L2-S1 with prior surgery  . Small bowel obstruction    versus ileus after last bck surgery  . Spondylosis     PAST SURGICAL HISTORY:   Past Surgical History:  Procedure Laterality Date  . BACK SURGERY    . CATARACT EXTRACTION W/ INTRAOCULAR LENS  IMPLANT, BILATERAL  2011  . COLONOSCOPY  12/09/2004  . LIPOMA EXCISION  1980's   "fatty tumors"  . [place] SURGERY  02/2009   "ruptured disc"  . NASAL SEPTUM SURGERY  80's  . POSTERIOR LUMBAR FUSION  06/2010; 10/2011   "placed screws, rods, spacers both times"  . REPAIR DURAL / CSF LEAK  02/2009  . TOTAL ABDOMINAL HYSTERECTOMY  02/1993    SOCIAL HISTORY:   Social History   Social History  . Marital status: Married    Spouse name: [name]  . Number of children: 2  . Years of education: 12th    Occupational History  . retired Retired    Radio producer work   Social History Main Topics  . Smoking status: Never Smoker  . Smokeless tobacco: Never Used  . Alcohol use No     Comment: 02/01/2014 " glasseof wine once in a blue moon; < once/year"  . Drug use: No  . Sexual activity: No     Comment: hysterectomy   Other Topics Concern  . Not on file   Social History Narrative    Patient lives at home with husband [name].   Patient is retired.   Patient has a high school education.    Patient has 2 children     FAMILY HISTORY:   Family Status  Relation Status  . Mother Deceased at age 35   natural causes  . Father Deceased at age 67   MI  . Maternal Grandmother Deceased  . Maternal Grandfather Deceased  . Paternal Grandmother Deceased  . Paternal Grandfather Deceased  . Sister Deceased  . Sister   . Sister   . Daughter Alive    ROS:  A complete 10 system review of systems was obtained and was unremarkable apart from what is mentioned above.  PHYSICAL EXAMINATION:    VITALS:   Vitals:   04/16/16 0912  BP: 128/72  Pulse: 64  Weight: 204 lb (92.5 kg)  Height: 5\' 5"  (1.651 m)    GEN:  The patient appears stated age and is in NAD. HEENT:  Normocephalic, atraumatic.  The mucous membranes are moist. The superficial temporal arteries are without ropiness or tenderness. CV:  RRR Lungs:  CTAB Neck/HEME:  There are no carotid bruits bilaterally.  Neurological examination:  Orientation: The patient is alert and oriented x3. Fund of knowledge is appropriate.  Recent and remote memory are intact.  Attention and concentration are normal.    Able to name objects and repeat phrases. Cranial nerves: There is good facial symmetry. 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  Pupils are equal round and minimally reactive to light bilaterally.  Funduscopic exam is attempted, but disc margins are not well visualized.  Extraocular muscles are intact but there is mild downgaze paresis.  She has no difficulty holding prolonged upgaze.  She does have a few square wave jerks.  The visual fields are full to confrontational testing. The speech is fluent and clear but she has a slight pseudobulbar quality to the speech.  She does not have significant difficulty with guttural sounds.  Soft palate rises symmetrically and there is no tongue  deviation. Hearing is intact to conversational  tone. Sensation: Sensation is intact to light and pinprick throughout (facial, trunk, extremities). Vibration is intact at the bilateral big toe but they are decreased, right greater than left. There is no extinction with double simultaneous stimulation. There is no sensory dermatomal level identified. Motor: Strength is 5/5 in the bilateral upper and lower extremities.   Shoulder shrug is equal and symmetric.  There is no pronator drift.  No fasciculations noted. Deep tendon reflexes: Deep tendon reflexes are 0/4 at the bilateral biceps, triceps, brachioradialis, patella and achilles. Plantar responses is upgoing on the right and neutral on the left.    Movement examination: Tone: There is normal tone in the bilateral upper extremities.  The tone in the lower extremities is normal.  Abnormal movements: None Coordination:  There is decremation with RAM's, especially with toe taps on the L but also with alternation of supination/pronation of forearm on the L.   Gait and Station: The patient has significant difficulty arising out of a deep-seated chair without the use of the hands.  She makes several attempts, but is unable to without pushing off of the chair.  The patient's stride length is decreased with almost no arm swing bilaterally.    ASSESSMENT/PLAN:  1.  Parkinsonism  -While the patient may have idiopathic Parkinson's disease, I am somewhat suspicious for one of the atypical parkinsonian states, and possibly Progressive Supranuclear Palsy (PSP).  She has some square wave jerks, diplopia (within the first 3 years of onset), and mild downgaze paresis.  We talked about nature, etiology and pathophysiology.   -I did see her while she was on levodopa, so this may have masked some of her symptoms.  The patient is not sure if it is working or not.  Ultimately, we decided to proceed with a levodopa challenge test.  She will hold her levodopa before that visit, along with a small dose of pramipexole  that she takes for restless leg.  -The patient certainly may benefit from physical therapy, regardless of what the levodopa challenge test shows.  We can address this after she has her test.  -I will do acetylcholine receptor antibody testing given the diplopia, but myasthenia gravis is lower on the list of differential diagnoses.  2.  Much greater than 50% of this 60 minute visit was spent in counseling with the patient and her husband.  This did not include extensive record review which took place on 04/15/2016 and took 45 minutes.  Cc:  Simona Huh, MD

## 2016-04-16 ENCOUNTER — Encounter: Payer: Self-pay | Admitting: Neurology

## 2016-04-16 ENCOUNTER — Ambulatory Visit (INDEPENDENT_AMBULATORY_CARE_PROVIDER_SITE_OTHER): Payer: Medicare Other | Admitting: *Deleted

## 2016-04-16 ENCOUNTER — Ambulatory Visit (INDEPENDENT_AMBULATORY_CARE_PROVIDER_SITE_OTHER): Payer: Medicare Other | Admitting: Neurology

## 2016-04-16 ENCOUNTER — Other Ambulatory Visit: Payer: Medicare Other

## 2016-04-16 VITALS — BP 128/72 | HR 64 | Ht 65.0 in | Wt 204.0 lb

## 2016-04-16 DIAGNOSIS — I4891 Unspecified atrial fibrillation: Secondary | ICD-10-CM

## 2016-04-16 DIAGNOSIS — H532 Diplopia: Secondary | ICD-10-CM

## 2016-04-16 DIAGNOSIS — R7301 Impaired fasting glucose: Secondary | ICD-10-CM | POA: Diagnosis not present

## 2016-04-16 DIAGNOSIS — G20 Parkinson's disease: Secondary | ICD-10-CM | POA: Diagnosis not present

## 2016-04-16 LAB — POCT INR: INR: 2

## 2016-04-16 NOTE — Patient Instructions (Signed)
1. Your provider has requested that you have labwork completed today. Please go to [facility] ([address]) on the second floor of this building before leaving the office today. You do not need to check in. If you are not called within 15 minutes please check with the front desk.   

## 2016-04-16 NOTE — Progress Notes (Signed)
Clinical Social Work Note  CSW received request to meet with pt during today's visit. Pt is a new patient in clinic and CSW met with pt and pt husband to introduce self and informed of social work role including connection to [employer] or Scientist, clinical (histocompatibility and immunogenetics), short-term coping/problem-solving counseling, and involvement in community. Pt reports that she lives at home with her husband. Pt uses a walker to assist with ambulating and reports some trouble with managing ADL's. Pt identifies support system as pt husband and children. Pt and pt husband appreciative of visit and availability for support and connection to resources. CSW provided CSW contact information and encouraged pt to contact CSW if any social work needs arise before next office visit.  Alison Murray, MSW, LCSW Clinical Social Worker [facility] [phone]

## 2016-04-19 ENCOUNTER — Other Ambulatory Visit: Payer: Self-pay | Admitting: Family Medicine

## 2016-04-19 DIAGNOSIS — H532 Diplopia: Secondary | ICD-10-CM

## 2016-04-21 ENCOUNTER — Encounter: Payer: Self-pay | Admitting: Internal Medicine

## 2016-04-21 ENCOUNTER — Ambulatory Visit (INDEPENDENT_AMBULATORY_CARE_PROVIDER_SITE_OTHER)
Admission: RE | Admit: 2016-04-21 | Discharge: 2016-04-21 | Disposition: A | Discharge status: Home / Self Care | Payer: Medicare Other | Source: Ambulatory Visit | Attending: Internal Medicine | Admitting: Internal Medicine

## 2016-04-21 ENCOUNTER — Ambulatory Visit (INDEPENDENT_AMBULATORY_CARE_PROVIDER_SITE_OTHER): Payer: Medicare Other | Admitting: Internal Medicine

## 2016-04-21 ENCOUNTER — Telehealth: Payer: Self-pay | Admitting: Neurology

## 2016-04-21 VITALS — BP 130/78 | HR 79 | Ht 65.0 in | Wt 200.6 lb

## 2016-04-21 DIAGNOSIS — J3089 Other allergic rhinitis: Secondary | ICD-10-CM | POA: Diagnosis not present

## 2016-04-21 DIAGNOSIS — J302 Other seasonal allergic rhinitis: Secondary | ICD-10-CM | POA: Diagnosis not present

## 2016-04-21 DIAGNOSIS — R059 Cough, unspecified: Secondary | ICD-10-CM

## 2016-04-21 DIAGNOSIS — I482 Chronic atrial fibrillation, unspecified: Secondary | ICD-10-CM

## 2016-04-21 DIAGNOSIS — R05 Cough: Secondary | ICD-10-CM

## 2016-04-21 LAB — MYASTHENIA GRAVIS PANEL 2
Acetylcholine Rec Binding: <0.3 nmol/L
Acetylcholine Rec Mod Ab: <1 % binding inhibition
Aceytlcholine Rec Bloc Ab: <15 % of inhibition (ref <15)

## 2016-04-21 NOTE — Assessment & Plan Note (Signed)
Morning cough apparently has been present through the fall. Nonspecific pattern apparently nonpurulent. She expresses concern because her sister died of lung cancer. Plan CXR

## 2016-04-21 NOTE — Telephone Encounter (Signed)
-----   Message from [place], DO sent at 04/21/2016  7:22 AM EST ----- Let pt know that labs look okay

## 2016-04-21 NOTE — Assessment & Plan Note (Signed)
Followed by cardiology. Pulse today is slightly irregular consistent with controlled ventricular response rate

## 2016-04-21 NOTE — Assessment & Plan Note (Signed)
Appropriate to stay off allergy vaccine now and see how she does. She continues OTC antihistamine and nasal steroid spray if needed. I explained that we are closing our allergy vaccine program at the end of December. If she needs future help she can go to another allergy office in town.

## 2016-04-21 NOTE — Patient Instructions (Signed)
We have stopped allergy vaccine now  Order CXR    Dx cough  Please call as needed

## 2016-04-21 NOTE — Progress Notes (Signed)
Let pt know that labs look okay

## 2016-04-21 NOTE — Telephone Encounter (Signed)
Tried to call patient with no answer. Mychart message sent to patient.

## 2016-04-21 NOTE — Progress Notes (Signed)
Patient ID: [name], female    DOB: Dec 11, 1940, 75 y.o.   MRN: [id number]  HPI F never smoker followed for allergic rhinitis, hx sleep apnea/ oral appliance, complicated by AtrialFib NPSG 01/25/08- AHI 6/ hr Oral appliance from Dr. Ron Parker   04/18/14- 49 yo FOLLOW FOR:  Allergies; wearing mouth guard at night for sleep, does not use CPAP; has a lot of dark sinus drainage in throat in the mornings Allergy vax 1:10  GO administered weekly. (Sister of [name])  04/22/2015-75 year old female never smoker followed for allergic rhinitis, history sleep apnea/oral appliance, complicated by Atrial fib/ warfarin, parkinsonism, glaucoma Allergy vax 1:10  GO No wheezing, no asthma, no need for rescue inhaler. Rhinitis symptoms have been well-controlled.  04/21/2016-75 year old female never smoker followed for Allergic rhinitis, History OSA/Oral Appliance, complicated by Atrial fib/warfarin, parkinsonism, glaucoma Allergy Vaccine 1:10 GO FOLLOWS FOR:Pt states she ran out of allergy vaccine for about 1 month-can not tell a difference being off of injections. Pt no longer uses oral appliance at night. Morning cough with clear phlegm worries her was her sister had lung cancer. No wheezing. Being treated for Parkinson's.  Review of Systems- see HPI Constitutional:   No-   weight loss, night sweats, fevers, chills, fatigue, lassitude. HEENT:   No-  headaches, difficulty swallowing, tooth/dental problems, sore throat,       No-  sneezing, itching, ear ache, nasal congestion, post nasal drip,  CV:  No-   chest pain, orthopnea, PND, swelling in lower extremities, anasarca, dizziness, palpitations Resp: No-   shortness of breath with exertion or at rest.            +  productive cough,  No non-productive cough,  No- coughing up of blood.              No-   change in color of mucus.  No- wheezing.   Skin: No-   rash or lesions. GI:  No-   heartburn, indigestion, abdominal pain, nausea,  vomiting,  GU:  MS:  No-   joint pain or swelling.   Neuro-     +Parkinson's Psych:  No- change in mood or affect. No depression or anxiety.  No memory loss.  Objective:   Physical Exam General- Alert, Oriented, Affect-appropriate, Distress- none acute.  Skin- rash-none, lesions- none, excoriation- none Lymphadenopathy- none Head- atraumatic            Eyes- Gross vision intact, PERRLA, conjunctivae clear secretions            Ears- Hearing, canals-normal            Nose- Clear, no-Septal dev, mucus, polyps, erosion, perforation             Throat- Mallampati II-III , mucosa clear , drainage- none, tonsils- atrophic Neck- flexible , trachea midline, no stridor , thyroid nl, carotid no bruit Chest - symmetrical excursion , unlabored           Heart/CV- IRR/ no pacer , no murmur , no gallop  , no rub, nl s1 s2                           - JVD- none , edema- none, stasis changes- none, varices- none           Lung-  wheeze- none, cough+ rattle , dullness-none, rub- none           Chest wall-  Abd-  Br/ Gen/  Rectal- Not done, not indicated Extrem- cyanosis- none, clubbing, none, atrophy- none, strength- nl. + Rolling walker Neuro- +flat facies, speech a little slow, rolling walker           Is

## 2016-04-22 ENCOUNTER — Encounter: Payer: Self-pay | Admitting: *Deleted

## 2016-04-22 ENCOUNTER — Ambulatory Visit
Admission: RE | Admit: 2016-04-22 | Discharge: 2016-04-22 | Disposition: A | Discharge status: Home / Self Care | Payer: Medicare Other | Source: Ambulatory Visit | Attending: Family Medicine | Admitting: Family Medicine

## 2016-04-22 DIAGNOSIS — H532 Diplopia: Secondary | ICD-10-CM

## 2016-04-22 DIAGNOSIS — I6523 Occlusion and stenosis of bilateral carotid arteries: Secondary | ICD-10-CM | POA: Diagnosis not present

## 2016-04-22 NOTE — Progress Notes (Signed)
Faxed signed prescription renewal form for sinemet 1.5 tablets 4x/day by AA,MD back to [employer]. Fax: [phone]. Received confirmation. Quantity: 630, 3 refills.

## 2016-04-26 DIAGNOSIS — H532 Diplopia: Secondary | ICD-10-CM | POA: Diagnosis not present

## 2016-05-06 ENCOUNTER — Other Ambulatory Visit: Payer: Self-pay

## 2016-05-06 MED ORDER — METOPROLOL SUCCINATE ER 100 MG PO TB24: 100.0000 mg | ORAL_TABLET | Freq: Every day | ORAL | 3 refills | Status: DC

## 2016-05-10 ENCOUNTER — Ambulatory Visit (INDEPENDENT_AMBULATORY_CARE_PROVIDER_SITE_OTHER): Payer: Medicare Other | Admitting: *Deleted

## 2016-05-10 DIAGNOSIS — Z5181 Encounter for therapeutic drug level monitoring: Secondary | ICD-10-CM

## 2016-05-10 DIAGNOSIS — I4891 Unspecified atrial fibrillation: Secondary | ICD-10-CM

## 2016-05-10 LAB — POCT INR: INR: 2.5

## 2016-05-11 NOTE — Progress Notes (Addendum)
[name] was seen today in the movement disorders clinic for neurologic consultation at the request of [name].  Her PCP is Simona Huh, MD.  The consultation is for the evaluation of PD.  This patient is accompanied in the office by her spouse who supplements the history.  I have reviewed prior records made available to me.  Extensive record review was done on 04/15/2016 and took 45 minutes.  The patient has seen multiple physicians at [facility] neurology, including Dr. [name] (only for EMG), Dr. [name], Dr. [name] and Dr. [name].  The patient was first seen by Dr. [name] in 2014 and subsequently had an EMG by Dr. [name].  The primary complaint at that point was knee pain and she was diagnosed with a painful peripheral neuropathy.  In June, 2015 she followed up with Dr. [name] and was complaining about dizziness and gait instability.  Dr. [name] felt that perhaps this was due to cervical degenerative changes.  When she came back for follow-up, Dr. [name] had left the practice and Dr. [name] had resumed her care.  Dr. [name] felt that her symptoms were consistent with parkinsonism and she was started on levodopa in December, 2015.  She was started on half a tablet 3 times per day.  Initial follow-up notes more month later indicate that the patient thought that the medication was helpful.  In February, 2016 she was started on Azilect but it was expensive and she thought that it caused swelling and so pt d/c it.  However, once she d/c she thought that she got worse so she restarted it.  In July, 2016 it was attempted to discontinue the levodopa and try her on Neupro patch.  The patient felt that she did worse with this and she stopped patch.  The patient saw Dr. [name] in May, 2017 for a second opinion.  Dr. [name] stated "she does not give any sinister atypical history for atypical parkinsonism but does not have a very strong history or telltale exam for idiopathic Parkinson's disease either.  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   Specific Symptoms:  Tremor: No. Family hx of similar:  No. Voice: patients husband thinks that voice is same; pt thinks that it is deeper Sleep: patient states that she doesn't sleep well (but states that it is because of husbands electric blanket makes her hot)  Vivid Dreams:  Yes.    Acting out dreams:  Yes.  , screams out x several years but no falling out of the bed Wet Pillows: Yes.   Postural symptoms:  Yes.   x 3-4 years and thinks that perhaps due to back surgeries.  Used walker x several years  Falls? Only one time in march 2017 - trying to put something on bed and fell; few other times bent down and "eased down" to floor (for example at file cabinet) Bradykinesia symptoms: slow movements, difficulty getting out of a chair and small steps Loss of smell:  No. Loss of taste:  No. Urinary Incontinence:  Yes.   x 1 year (cannot get to bathroom fast enough) Difficulty Swallowing:  :not really" Handwriting, micrographia: Yes.   Trouble with ADL's:  Yes.   but better than she could (trouble with socks)  Trouble buttoning clothing: No. Depression:  No. Memory changes:  No. Hallucinations:  No.  visual distortions: No.,  but does have some floaters and sees Dr. [name] N/V:  No. Lightheaded:  No.  Syncope: No. Diplopia:  Yes.   x 3-4 months (horizontal, goes away if closes eye, comes and goes, doesn't know if time of day, doesn't know if better if after nap; sometimes gets told to open L eye wider but not sure if has ptosis) Dyskinesia:  No.  Neuroimaging has previously been performed.  It is available for my review today.  I reviewed the patient's MRI of the brain dated 11/28/2013.  There was mild atrophy and a few T2 hyperintensities.  05/11/16 update:  Pt presents for levodopa challenge test.   She has been off of levodopa since 4:30 pm.  She has also held her pramipexole, 0.5 mg q hs that she takes for RLS.  Her legs are burning.  Her legs feel restless.  She doesn't feel more stiff off of medication.  Her main issues off of drug were at night (RLS).    PREVIOUS MEDICATIONS: Sinemet, Mirapex and neupro  ALLERGIES:   Allergies  Allergen Reactions  . Lyrica [Pregabalin] Other (See Comments)    Felt loopy  . Zocor [Simvastatin] Other (See Comments)    Myalgias  . Relafen [Nabumetone] Rash    CURRENT MEDICATIONS:  Outpatient Encounter Prescriptions as of 05/12/2016  Medication Sig  . acetaminophen (TYLENOL) 500 MG tablet Take 1,000 mg by mouth daily as needed (pain).  . carbidopa-levodopa (SINEMET IR) 25-100 MG tablet Take 1.5 tablets by mouth 4 (four) times daily.  . fexofenadine (ALLEGRA) 180 MG tablet Take 180 mg by mouth as needed. For allergies  . flecainide (TAMBOCOR) 50 MG tablet Take 1 tablet (50 mg total) by mouth 2 (two) times daily.  . furosemide (LASIX) 20 MG tablet Take 20 mg by mouth 2 (two) times daily.   . Magnesium 400 MG TABS Take 400 mg by mouth at bedtime.  Marland Kitchen MEGARED OMEGA-3 KRILL OIL 500 MG CAPS Take 500 mg by mouth daily.  . metolazone (ZAROXOLYN) 5 MG tablet Take 5 mg by mouth daily.  . metoprolol succinate (TOPROL-XL) 100 MG 24 hr tablet Take 1 tablet (100 mg total) by mouth daily.  . potassium chloride SA (K-DUR,KLOR-CON) 20 MEQ tablet Take 40 mEq by mouth daily.   . pramipexole (MIRAPEX) 0.5 MG tablet Take 0.5 mg by mouth at bedtime. For restless legs  . pravastatin (PRAVACHOL) 40 MG tablet Take 40 mg by mouth at bedtime.   . TRAVATAN Z 0.004 % SOLN ophthalmic solution   . warfarin (COUMADIN) 5 MG tablet Take as directed by coumadin clinic (Patient taking differently: Take 2.5 mg by mouth daily. Take as directed by coumadin clinic)  . EPINEPHrine (EPIPEN 2-PAK) 0.3 mg/0.3 mL IJ SOAJ injection Inject 0.3 mg into the muscle as needed (allergic reaction).  Inject into the thigh   No facility-administered encounter medications on file as of 05/12/2016.     PAST MEDICAL HISTORY:   Past Medical History:  Diagnosis Date  . Allergic rhinitis   . Arthritis    "all over"  . CHF (congestive heart failure) ([place])   . Chronic back pain   . DJD (degenerative joint disease)   . GERD (gastroesophageal reflux disease)   . Hyperlipidemia   . Hypertension   . Lumbar stenosis   . OSA (obstructive sleep apnea)    "couldn't handle CPAP; use mouth guard some; not all the time" (01/06/2014)  . PAF (paroxysmal atrial fibrillation) ([place])   . Scoliosis    with  radiculopathy L2-S1 with prior surgery  . Small bowel obstruction    versus ileus after last bck surgery  . Spondylosis     PAST SURGICAL HISTORY:   Past Surgical History:  Procedure Laterality Date  . BACK SURGERY    . CATARACT EXTRACTION W/ INTRAOCULAR LENS  IMPLANT, BILATERAL  2011  . COLONOSCOPY  12/09/2004  . LIPOMA EXCISION  1980's   "fatty tumors"  . [place]GERY  02/2009   "ruptured disc"  . NASAL SEPTUM SURGERY  80's  . POSTERIOR LUMBAR FUSION  06/2010; 10/2011   "placed screws, rods, spacers both times"  . REPAIR DURAL / CSF LEAK  02/2009  . TOTAL ABDOMINAL HYSTERECTOMY  02/1993    SOCIAL HISTORY:   Social History   Social History  . Marital status: Married    Spouse name: [name]  . Number of children: 2  . Years of education: 12th    Occupational History  . retired Retired    Radio producer work   Social History Main Topics  . Smoking status: Never Smoker  . Smokeless tobacco: Never Used  . Alcohol use No     Comment: 02/01/2014 " glasseof wine once in a blue moon; < once/year"  . Drug use: No  . Sexual activity: No     Comment: hysterectomy   Other Topics Concern  . Not on file   Social History Narrative   Patient lives at home with husband [name].   Patient is retired.   Patient has a high school education.    Patient has 2 children     FAMILY HISTORY:     Family Status  Relation Status  . Mother Deceased at age 72   natural causes  . Father Deceased at age 63   MI  . Maternal Grandmother Deceased  . Maternal Grandfather Deceased  . Paternal Grandmother Deceased  . Paternal Grandfather Deceased  . Sister Deceased  . Sister   . Sister   . Daughter Alive    ROS:  A complete 10 system review of systems was obtained and was unremarkable apart from what is mentioned above.  PHYSICAL EXAMINATION:    VITALS:   Vitals:   05/12/16 1417  BP: 128/70  Pulse: 72  Weight: 199 lb (90.3 kg)  Height: 5\' 5"  (1.651 m)    GEN:  The patient appears stated age and is in NAD. HEENT:  Normocephalic, atraumatic.  The mucous membranes are moist. The superficial temporal arteries are without ropiness or tenderness. CV:  RRR Lungs:  CTAB Neck/HEME:  There are no carotid bruits bilaterally.  Neurological examination:  Orientation: The patient is alert and oriented x3. Fund of knowledge is appropriate.  Recent and remote memory are intact.  Attention and concentration are normal.    Able to name objects and repeat phrases. Cranial nerves: There is good facial symmetry. 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  Pupils are equal round and minimally reactive to light bilaterally.  Funduscopic exam is attempted, but disc margins are not well visualized.  Extraocular muscles are intact but there is mild downgaze paresis.  She does have a few square wave jerks.  The visual fields are full to confrontational testing. The speech is fluent and clear but she has a slight pseudobulbar quality to the speech.  She does not have significant difficulty with guttural sounds.  Soft palate rises symmetrically and there is no tongue deviation. Hearing is intact to conversational tone. Sensation: Sensation is intact to light touch throughout Motor:  Strength is 5/5 in the bilateral upper and lower extremities.   Shoulder shrug is equal and symmetric.  There is no pronator drift.  No  fasciculations noted.  Movement examination:  A complete UPDRS motor on/off test was done.  UPDRS motor off score was 25.  300 mg of levodopa was then given, dissolved in ginger ale.  I then waited 35 minutes to reexamine the patient.  UPDRS motor on score was 20.  Details were documented on a separate neurophysiologic work sheet.  LABS:  AchR AB testing negative  ASSESSMENT/PLAN:  1.  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  She has some square wave jerks, diplopia (within the first 3 years of onset), and mild downgaze paresis.  We talked about nature, etiology and pathophysiology.   -Levodopa challenge test was done today and levodopa was only of mild benefit.  However, the patient does think that it is helpful and it did help the rigidity in the left arm.  In addition, it helped her restless leg as does the pramipexole.  I gave her 300 mg here in the office and she only takes 150 mg at home.  We decided to slightly increase her dosage with each dose so that she is now going to take her carbidopa/levodopa 25/100, 2 tablets 4 times per day.  She will continue her pramipexole, 0.5 mg at night that she takes for restless leg.  -refer for PT  -refer for baseline MBE  -talked about atypical support group hopefully to be formed later this year   2.  Much greater than 50% of this 60 minute visit was spent in counseling with the patient and her husband.  This did not include the 30 min wait time for levodopa to kick in.  ADDENDUM:  Labs received from primary care physician on 07/19/2016 that were ordered on 07/16/2016.  Sodium was 142, potassium 3.6, chloride 103, CO2 30, BUN 19, creatinine 1.05.  AST 14, ALT 4, glucose 87.  Cc:  Simona Huh, MD

## 2016-05-12 ENCOUNTER — Encounter: Payer: Self-pay | Admitting: Neurology

## 2016-05-12 ENCOUNTER — Ambulatory Visit (INDEPENDENT_AMBULATORY_CARE_PROVIDER_SITE_OTHER): Payer: Medicare Other | Admitting: Neurology

## 2016-05-12 VITALS — BP 128/70 | HR 72 | Ht 65.0 in | Wt 199.0 lb

## 2016-05-12 DIAGNOSIS — R1319 Other dysphagia: Secondary | ICD-10-CM | POA: Diagnosis not present

## 2016-05-12 DIAGNOSIS — G20 Parkinson's disease: Secondary | ICD-10-CM | POA: Diagnosis not present

## 2016-05-12 DIAGNOSIS — G231 Progressive supranuclear ophthalmoplegia [Steele-Richardson-Olszewski]: Secondary | ICD-10-CM

## 2016-05-12 MED ORDER — CARBIDOPA-LEVODOPA 25-100 MG PO TABS
3.0000 | ORAL_TABLET | Freq: Once | ORAL | Status: AC
  Administered 2016-05-12: 3 via ORAL

## 2016-05-12 MED ORDER — CARBIDOPA-LEVODOPA 25-100 MG PO TABS: 2.0000 | ORAL_TABLET | Freq: Four times a day (QID) | ORAL | 1 refills | Status: DC

## 2016-05-12 NOTE — Patient Instructions (Signed)
1. Increase Levodopa to 2 tablets four times daily.   2. You have been referred to [facility] for physical therapy. They will call you directly to schedule an appointment.  Please call [phone] if you do not hear from them.   3. We have sent a referral to [facility] for your modified barium swallow. They will call you to schedule. Please arrive 15 minutes prior and go to 1st floor radiology. If you do not hear from them please call [phone].

## 2016-05-13 ENCOUNTER — Other Ambulatory Visit (HOSPITAL_COMMUNITY): Payer: Self-pay | Admitting: Neurology

## 2016-05-13 DIAGNOSIS — R1319 Other dysphagia: Secondary | ICD-10-CM

## 2016-05-21 ENCOUNTER — Ambulatory Visit (HOSPITAL_COMMUNITY)
Admission: RE | Admit: 2016-05-21 | Discharge: 2016-05-21 | Disposition: A | Discharge status: Home / Self Care | Payer: Medicare Other | Source: Ambulatory Visit | Attending: Neurology | Admitting: Neurology

## 2016-05-21 DIAGNOSIS — R1310 Dysphagia, unspecified: Secondary | ICD-10-CM | POA: Diagnosis not present

## 2016-05-21 DIAGNOSIS — I509 Heart failure, unspecified: Secondary | ICD-10-CM | POA: Insufficient documentation

## 2016-05-21 DIAGNOSIS — M48061 Spinal stenosis, lumbar region without neurogenic claudication: Secondary | ICD-10-CM | POA: Diagnosis not present

## 2016-05-21 DIAGNOSIS — I110 Hypertensive heart disease with heart failure: Secondary | ICD-10-CM | POA: Insufficient documentation

## 2016-05-21 DIAGNOSIS — E785 Hyperlipidemia, unspecified: Secondary | ICD-10-CM | POA: Insufficient documentation

## 2016-05-21 DIAGNOSIS — K219 Gastro-esophageal reflux disease without esophagitis: Secondary | ICD-10-CM | POA: Insufficient documentation

## 2016-05-21 DIAGNOSIS — M1990 Unspecified osteoarthritis, unspecified site: Secondary | ICD-10-CM | POA: Diagnosis not present

## 2016-05-21 DIAGNOSIS — G4733 Obstructive sleep apnea (adult) (pediatric): Secondary | ICD-10-CM | POA: Diagnosis not present

## 2016-05-21 DIAGNOSIS — G20 Parkinson's disease: Secondary | ICD-10-CM | POA: Diagnosis not present

## 2016-05-21 DIAGNOSIS — M419 Scoliosis, unspecified: Secondary | ICD-10-CM | POA: Diagnosis not present

## 2016-05-21 DIAGNOSIS — R1319 Other dysphagia: Secondary | ICD-10-CM

## 2016-06-08 ENCOUNTER — Ambulatory Visit (INDEPENDENT_AMBULATORY_CARE_PROVIDER_SITE_OTHER): Payer: Medicare Other | Admitting: *Deleted

## 2016-06-08 DIAGNOSIS — I4891 Unspecified atrial fibrillation: Secondary | ICD-10-CM | POA: Diagnosis not present

## 2016-06-08 DIAGNOSIS — Z5181 Encounter for therapeutic drug level monitoring: Secondary | ICD-10-CM

## 2016-06-08 LAB — POCT INR: INR: 2.9

## 2016-06-22 ENCOUNTER — Ambulatory Visit: Payer: Medicare Other | Attending: Neurology | Admitting: Physical Therapy

## 2016-06-22 ENCOUNTER — Ambulatory Visit: Payer: Medicare Other | Admitting: Physical Therapy

## 2016-06-22 DIAGNOSIS — M6281 Muscle weakness (generalized): Secondary | ICD-10-CM | POA: Diagnosis not present

## 2016-06-22 DIAGNOSIS — R293 Abnormal posture: Secondary | ICD-10-CM | POA: Diagnosis not present

## 2016-06-22 DIAGNOSIS — R262 Difficulty in walking, not elsewhere classified: Secondary | ICD-10-CM | POA: Insufficient documentation

## 2016-06-22 DIAGNOSIS — R2681 Unsteadiness on feet: Secondary | ICD-10-CM | POA: Diagnosis not present

## 2016-06-22 DIAGNOSIS — G20 Parkinson's disease: Secondary | ICD-10-CM | POA: Diagnosis not present

## 2016-06-22 DIAGNOSIS — R2689 Other abnormalities of gait and mobility: Secondary | ICD-10-CM | POA: Insufficient documentation

## 2016-06-23 NOTE — Therapy (Signed)
[facility] [address] [place], [place], [address] Phone: [phone]   Fax:  [phone]  Physical Therapy Evaluation  Patient Details  Name: [name] MRN: [id number] Date of Birth: [birth year] Referring Provider: Alonza Bogus, DO  Encounter Date: 06/22/2016      PT End of Session - 06/23/16 0900    Visit Number 1   Number of Visits 9   Date for PT Re-Evaluation 08/22/16   Authorization Type Blue Medicare-GCODE every 10th visit   PT Start Time 1404   PT Stop Time 1445   PT Time Calculation (min) 41 min   Equipment Utilized During Treatment Gait belt   Activity Tolerance Patient tolerated treatment well   Behavior During Therapy [facility] for tasks assessed/performed      Past Medical History:  Diagnosis Date  . Allergic rhinitis   . Arthritis    "all over"  . CHF (congestive heart failure) ([place])   . Chronic back pain   . DJD (degenerative joint disease)   . GERD (gastroesophageal reflux disease)   . Hyperlipidemia   . Hypertension   . Lumbar stenosis   . OSA (obstructive sleep apnea)    "couldn't handle CPAP; use mouth guard some; not all the time" (01/06/2014)  . PAF (paroxysmal atrial fibrillation) ([place])   . Scoliosis    with radiculopathy L2-S1 with prior surgery  . Small bowel obstruction    versus ileus after last bck surgery  . Spondylosis     Past Surgical History:  Procedure Laterality Date  . BACK SURGERY    . CATARACT EXTRACTION W/ INTRAOCULAR LENS  IMPLANT, BILATERAL  2011  . COLONOSCOPY  12/09/2004  . LIPOMA EXCISION  1980's   "fatty tumors"  . [place] SURGERY  02/2009   "ruptured disc"  . NASAL SEPTUM SURGERY  80's  . POSTERIOR LUMBAR FUSION  06/2010; 10/2011   "placed screws, rods, spacers both times"  . REPAIR DURAL / CSF LEAK  02/2009  . TOTAL ABDOMINAL HYSTERECTOMY  02/1993    There were no vitals filed for this visit.       Subjective Assessment - 06/22/16 1411    Subjective Pt reports seeing Dr. Carles Collet, with possible Parkinson's disease.  Pt reports difficulty walking, gets lightheaded and losing balance upon standing.  Husband reports difficulty stooping down to pick up objects.  Pt uses rollator walker; she has had at least 4 falls.  She has difficulty getting up from low squats  Uses cane in the home   Patient is accompained by: Family member  husband   Patient Stated Goals Pt's goal for therapy is to improve walking and reduce falls.   Currently in Pain? No/denies            [facility] PT Assessment - 06/22/16 1416      Assessment   Medical Diagnosis Parkinson's disease   Referring Provider Alonza Bogus, DO   Onset Date/Surgical Date --  December 2017 MD visit     Precautions   Precautions Fall     Balance Screen   Has the patient fallen in the past 6 months Yes   How many times? 4   Has the patient had a decrease in activity level because of a fear of falling?  Yes   Is the patient reluctant to leave their home because of a fear of falling?  No     Home Ecologist residence   Living Arrangements Spouse/significant  other   Available Help at Discharge Family   Type of [place] to enter   Entrance Stairs-Number of Steps [place] One level   [place] - 4 wheels;Cane - single point;Shower seat;Grab bars - tub/shower  Elevated commode     Prior Function   Level of Independence Independent with household mobility without device;Independent with community mobility with device  Some assistance with ADLs   Leisure Goes out to eat; washes clothes and light house cleaning; no formal exercise program     Observation/Other Assessments   Focus on Therapeutic Outcomes (FOTO)  NA     Posture/Postural Control   Posture/Postural Control Postural limitations   Postural Limitations Rounded Shoulders;Forward head     ROM / Strength   AROM / PROM /  Strength Strength     Strength   Strength Assessment Site Hip;Knee;Ankle   Right/Left Hip Right;Left   Right Hip Flexion 4/5   Left Hip Flexion 3+/5   Right/Left Knee Right;Left   Right Knee Flexion 4/5   Right Knee Extension 4/5   Left Knee Flexion 4/5   Left Knee Extension 4/5   Right/Left Ankle Right;Left   Right Ankle Dorsiflexion 3+/5   Left Ankle Dorsiflexion 3-/5     Transfers   Transfers Sit to Stand;Stand to Sit   Sit to Stand 6: Modified independent (Device/Increase time);With upper extremity assist;With armrests;From chair/3-in-1   Stand to Sit 5: Supervision;With upper extremity assist;With armrests;To chair/3-in-1   Comments Prefers use of UEs for sit<>stand     Ambulation/Gait   Ambulation/Gait Yes   Ambulation/Gait Assistance 6: Modified independent (Device/Increase time);5: Supervision;4: Min guard  Min guard with no device   Ambulation/Gait Assistance Details With no device (as patient reports she often walks at home), pt demo widened BOS, no arm swing, forward flexed posture, decreased foot celarance.   Ambulation Distance (Feet) 110 Feet   Assistive device Rollator  No device for gait velocity   Gait Pattern Step-through pattern;Decreased step length - right;Decreased step length - left;Decreased dorsiflexion - left;Shuffle;Trunk flexed;Wide base of support;Poor foot clearance - left;Poor foot clearance - right  L foot slap with gait   Ambulation Surface Level;Indoor   Gait velocity 13.53 sec with rollator:  2.42 ft/sec; 15.97 sec wtih no device (2.05 ft/sec)   Gait Comments Requested that patient use rollator walker as much as possible, even in home, due to increased fall risk per Berg scores.     Standardized Balance Assessment   Standardized Balance Assessment Berg Balance Test;Timed Up and Go Test     Berg Balance Test   Sit to Stand Able to stand without using hands and stabilize independently   Standing Unsupported Able to stand safely 2 minutes    Sitting with Back Unsupported but Feet Supported on Floor or Stool Able to sit safely and securely 2 minutes   Stand to Sit Controls descent by using hands   Transfers Able to transfer safely, definite need of hands   Standing Unsupported with Eyes Closed Able to stand 10 seconds with supervision   Standing Ubsupported with Feet Together Needs help to attain position but able to stand for 30 seconds with feet together   From Standing, Reach Forward with Outstretched Arm Reaches forward but needs supervision   From Standing Position, Pick up Object from Floor Able to pick up shoe, needs supervision   From Standing Position, Turn to Look Behind  Over each Shoulder Turn sideways only but maintains balance   Turn 360 Degrees Needs close supervision or verbal cueing   Standing Unsupported, Alternately Place Feet on Step/Stool Needs assistance to keep from falling or unable to try   Standing Unsupported, One Foot in Front Able to plae foot ahead of the other independently and hold 30 seconds   Standing on One Leg Unable to try or needs assist to prevent fall   Total Score 32   Berg comment: Scores <45/56 indicate increased fall risk; scores <38/56 indicate need for RW for safety with gait     Timed Up and Go Test   Normal TUG (seconds) 19.58   TUG Comments Scores >13.5 sec indicate increased fall risk                                PT Long Term Goals - 06/23/16 0907      PT LONG TERM GOAL #1   Title Pt will perform HEP with husband's supervision, for improved transfers, gait and balance.  TARGET 07/23/16   Time 4   Period Weeks   Status New     PT LONG TERM GOAL #2   Title Pt will improve Berg score to at least 37/56 for decreased fall risk.   Time 4   Period Weeks   Status New     PT LONG TERM GOAL #3   Title Pt will improve gait velocity to at least 2.62 ft/sec for imrpoved gait efficiency and safety.   Time 4   Period Weeks   Status New     PT LONG TERM  GOAL #4   Title Pt will improve TUG score to less than or equal to 15 sec for decreased fall risk.   Time 4   Period Weeks   Status New     PT LONG TERM GOAL #5   Title Pt/family will verbalize understanding of fall prevention in the home environment.     Time 4   Period Weeks   Status New               Plan - 06/23/16 0901    Clinical Impression Statement Pt is a 76 year old female who presents to OP PT with PArkinson's disease/Progressive supranuclear palsy.  She presents to OP PT with history of 4 falls in the past 6 months, with at least 3 medical co-morbidities (See EPIC PMH).  Pt presents with decreased balance, abnormal posture, decreased safety and independence with gait, decreased ease and efficiency of transfers (without use of UEs), decreased muscle strength.  Pt is at high risk of falls per TUG and Berg scores.  Pt will benefit from skilled physical therapy to address the above stated deficits and to improve functional mobility/decrease fall risk.   Rehab Potential Good   PT Frequency 2x / week   PT Duration 4 weeks  plus eval   PT Treatment/Interventions ADLs/Self Care Home Management;Functional mobility training;Gait training;DME Instruction;Therapeutic activities;Therapeutic exercise;Balance training;Neuromuscular re-education;Patient/family education   PT Next Visit Plan Transfer training, gait training/education in need to use rollator walker in home if possible/simulate home situations if needed; HEP to address transfers, lower extremity functional stength, balance   Consulted and Agree with Plan of Care Patient;Family member/caregiver   Family Member Consulted Husband      Patient will benefit from skilled therapeutic intervention in order to improve the following deficits and impairments:  Abnormal gait,  Decreased balance, Decreased mobility, Decreased safety awareness, Decreased strength, Difficulty walking, Postural dysfunction  Visit Diagnosis: Other  abnormalities of gait and mobility  Abnormal posture  Unsteadiness on feet      G-Codes - 07-02-16 0912    Functional Assessment Tool Used TUG 19.58 rollator;gait vel 2.42 ft/sec rollator; Merrilee Jansky 32/56; 4 falls in the past 6 months   Functional Limitation Mobility: Walking and moving around   Mobility: Walking and Moving Around Current Status [phone]) At least 40 percent but less than 60 percent impaired, limited or restricted   Mobility: Walking and Moving Around Goal Status [phone]) At least 20 percent but less than 40 percent impaired, limited or restricted       Problem List Patient Active Problem List   Diagnosis Date Noted  . Cough 04/21/2016  . Medication management 04/18/2015  . Parkinsonism ([place]) 05/15/2014  . Chronic anticoagulation -warfarin therapy 02/03/2014  . Atrial fibrillation with RVR ([place]) 01/31/2014  . Gait instability 11/16/2013  . Encounter for therapeutic drug monitoring 07/20/2013  . Edema 03/03/2010  . ATRIAL FIBRILLATION 04/23/2008  . Elevated lipids 01/31/2008  . Obstructive sleep apnea 01/31/2008  . GLAUCOMA 01/30/2008  . Seasonal and perennial allergic rhinitis 01/30/2008  . DEGENERATIVE JOINT DISEASE 01/30/2008    Frazier Butt. 07/02/16, 9:14 AM Frazier Butt., PT [facility] [address] [place], [place], [address] Phone: [phone]   Fax:  [phone]  Name: [name] MRN: [id number] Date of Birth: November 16, 1940

## 2016-06-28 ENCOUNTER — Ambulatory Visit: Payer: Medicare Other | Admitting: Physical Therapy

## 2016-06-28 VITALS — BP 137/74 | HR 70

## 2016-06-28 DIAGNOSIS — R262 Difficulty in walking, not elsewhere classified: Secondary | ICD-10-CM

## 2016-06-28 DIAGNOSIS — R2689 Other abnormalities of gait and mobility: Secondary | ICD-10-CM

## 2016-06-28 DIAGNOSIS — M6281 Muscle weakness (generalized): Secondary | ICD-10-CM

## 2016-06-28 NOTE — Therapy (Signed)
[facility] [address] [place], [place], [address] Phone: [phone]   Fax:  [phone]  Physical Therapy Treatment  Patient Details  Name: [name] MRN: [id number] Date of Birth: 1940/07/10 Referring Provider: Alonza Bogus, DO  Encounter Date: 06/28/2016      PT End of Session - 06/28/16 1702    Visit Number 2   Number of Visits 9   Date for PT Re-Evaluation 08/22/16   Authorization Type Blue Medicare-GCODE every 10th visit   PT Start Time 1015   PT Stop Time 1103   PT Time Calculation (min) 48 min   Equipment Utilized During Treatment Gait belt   Activity Tolerance Patient tolerated treatment well   Behavior During Therapy [facility] for tasks assessed/performed      Past Medical History:  Diagnosis Date  . Allergic rhinitis   . Arthritis    "all over"  . CHF (congestive heart failure) ([place])   . Chronic back pain   . DJD (degenerative joint disease)   . GERD (gastroesophageal reflux disease)   . Hyperlipidemia   . Hypertension   . Lumbar stenosis   . OSA (obstructive sleep apnea)    "couldn't handle CPAP; use mouth guard some; not all the time" (01/06/2014)  . PAF (paroxysmal atrial fibrillation) ([place])   . Scoliosis    with radiculopathy L2-S1 with prior surgery  . Small bowel obstruction    versus ileus after last bck surgery  . Spondylosis     Past Surgical History:  Procedure Laterality Date  . BACK SURGERY    . CATARACT EXTRACTION W/ INTRAOCULAR LENS  IMPLANT, BILATERAL  2011  . COLONOSCOPY  12/09/2004  . LIPOMA EXCISION  1980's   "fatty tumors"  . [place] SURGERY  02/2009   "ruptured disc"  . NASAL SEPTUM SURGERY  80's  . POSTERIOR LUMBAR FUSION  06/2010; 10/2011   "placed screws, rods, spacers both times"  . REPAIR DURAL / CSF LEAK  02/2009  . TOTAL ABDOMINAL HYSTERECTOMY  02/1993    Vitals:   06/28/16 1018  BP: 137/74  Pulse: 70        Subjective Assessment - 06/28/16  1020    Subjective Denies fall since prior visit. Reports 3 of 4 prior falls occurred with patient bending to pick up something from the floor or low shelf (without UE support). Patient reports she has not used her rollator inside her home (as recommended by evaluating PT) due to lack of space. Reports she uses her SBQC inside, and sometimes does not even use that. She has several different canes and prefers the [facility] because it will stand up on it's own.   Uses cane in the home   Patient is accompained by: Family member  husband   Patient Stated Goals Pt's goal for therapy is to improve walking and reduce falls.   Currently in Pain? No/denies                         [facility] Adult PT Treatment/Exercise - 06/28/16 1616      Transfers   Transfers Sit to Stand;Stand to Sit   Sit to Stand 5: Supervision;Without upper extremity assist   Sit to Stand Details Verbal cues for technique;Visual cues for safe use of DME/AE   Stand to Sit 5: Supervision;Without upper extremity assist   Number of Reps 10 reps   Transfer Cueing Patient initially required 3 attempts to come to stand (  would partially rise, lose her balance posteriorly and return to sitting on the mat table. Educated patient on scooting forward to front edge of seat and positioning her feet behind her knees. Educated on forward and upward weight shift. Patient self-selects very wide BOS and educated patient that a goal will be to bring feet within shoulder width.      Ambulation/Gait   Ambulation/Gait Assistance 4: Min guard;5: Supervision   Ambulation/Gait Assistance Details With device (rollator vs SBQC) provided visual and verbal cues for safe use of DME.    Ambulation Distance (Feet) 220 Feet  seated rest; 110   Assistive device 4-wheeled walker;Small based quad cane   Gait Pattern Step-through pattern;Decreased step length - right;Decreased step length - left;Decreased dorsiflexion - left;Shuffle;Trunk flexed;Wide base of  support;Poor foot clearance - left;Poor foot clearance - right   Ambulation Surface Level;Indoor   Gait Comments Noted handles of rollator ~6 inches too high. Patient reports no one fit her for rollator and agreeable to lowering hand grips to allow only slight bend in her elbows. With this she was able to maintain proper distance to RW and more upright posture. With quad cane patient "rocks" the base along the inside back foot to the inside front foot. Educated on proper use and encouraged all 4 feet flat on floor when advancing her feet. She continued to "rock" [facility] however was contacting back 2 feet and then front 2 feet.      Posture/Postural Control   Postural Limitations Rounded Shoulders;Forward head;Increased thoracic kyphosis     Exercises   Exercises Ankle;Knee/Hip     Knee/Hip Exercises: Seated   Sit to Sand 10 reps;without UE support     Ankle Exercises: Seated   Toe Raise 10 reps  no resistance; x 10 with yellow band                PT Education - 06/28/16 1700    Education provided Yes   Education Details Educated on increasing safety with sit to stand/stand to sit transfers. Provided HEP including sit to stand and Lt ankle DF for strengthening   Person(s) Educated Patient;Spouse   Methods Explanation;Demonstration;Handout   Comprehension Verbalized understanding;Returned demonstration;Need further instruction             PT Long Term Goals - 06/28/16 1716      PT LONG TERM GOAL #1   Title Pt will perform HEP with husband's supervision, for improved transfers, gait and balance.  TARGET 07/23/16   Time 4   Period Weeks   Status New     PT LONG TERM GOAL #2   Title Pt will improve Berg score to at least 37/56 for decreased fall risk.   Time 4   Period Weeks   Status New     PT LONG TERM GOAL #3   Title Pt will improve gait velocity to at least 2.62 ft/sec for imrpoved gait efficiency and safety.   Time 4   Period Weeks   Status New     PT LONG  TERM GOAL #4   Title Pt will improve TUG score to less than or equal to 15 sec for decreased fall risk.   Time 4   Period Weeks   Status New     PT LONG TERM GOAL #5   Title Pt/family will verbalize understanding of fall prevention in the home environment.     Time 4   Period Weeks   Status New  Plan - 06/28/16 1708    Clinical Impression Statement Patient presents with decreased awareness of deficits, decreased safety awareness, decreased knowledge of safe use of DME, decreased balance with high fall risk, and impaired gait. She continues to refuse to use rollator inside her home, therefore focused a large part of session on the safe use of her SBQC. Initiated her HEP and provided with yellow theraband for ankle dorsiflexion. Husband present for all education to assist with carryover.   Rehab Potential Good   PT Frequency 2x / week   PT Duration 4 weeks  plus eval   PT Treatment/Interventions ADLs/Self Care Home Management;Functional mobility training;Gait training;DME Instruction;Therapeutic activities;Therapeutic exercise;Balance training;Neuromuscular re-education;Patient/family education   PT Next Visit Plan Review HEP for ?'s; Transfer and gait training  with rollator vs SBQC; simulate home situations if needed; HEP for lower extremity functional stength, balance   PT Home Exercise Plan 06/28/16 sit to stand--work on feet shoulder width apart; Lt ankle DF yellow tband   Consulted and Agree with Plan of Care Patient;Family member/caregiver   Family Member Consulted Husband      Patient will benefit from skilled therapeutic intervention in order to improve the following deficits and impairments:  Abnormal gait, Decreased balance, Decreased mobility, Decreased safety awareness, Decreased strength, Difficulty walking, Postural dysfunction, Decreased knowledge of use of DME  Visit Diagnosis: Other abnormalities of gait and mobility  Difficulty walking  Muscle  weakness (generalized)     Problem List Patient Active Problem List   Diagnosis Date Noted  . Cough 04/21/2016  . Medication management 04/18/2015  . Parkinsonism ([place]) 05/15/2014  . Chronic anticoagulation -warfarin therapy 02/03/2014  . Atrial fibrillation with RVR ([place]) 01/31/2014  . Gait instability 11/16/2013  . Encounter for therapeutic drug monitoring 07/20/2013  . Edema 03/03/2010  . ATRIAL FIBRILLATION 04/23/2008  . Elevated lipids 01/31/2008  . Obstructive sleep apnea 01/31/2008  . GLAUCOMA 01/30/2008  . Seasonal and perennial allergic rhinitis 01/30/2008  . DEGENERATIVE JOINT DISEASE 01/30/2008    06/28/2016 Barry Brunner, PT    Rexanne Mano 06/28/2016, 6:49 PM  [facility] [address] [place], [place], [address] Phone: [phone]   Fax:  [phone]  Name: [name] MRN: [id number] Date of Birth: 1940/12/08

## 2016-06-28 NOTE — Patient Instructions (Signed)
ANKLE: Dorsiflexion (Band)    Sit at edge of surface. Place band around top of foot. Keeping heel on floor, raise toes of banded foot. Hold __2_ seconds. Use ____yellow____ band. _10__ reps per set, _3__ sets per day, __5_ days per week  Copyright  VHI. All rights reserved.      Sit to Stand Transfers:  1. Scoot out to the edge of the chair 2. Place your feet flat on the floor, shoulder width apart.  Make sure your feet are tucked just under your knees. 3. Lean forward (nose over toes) with momentum, and stand up tall with your best posture.  Place arms across your chest 4. If you are in a low or soft chair, you can lean back and then forward up to stand, in order to get more momentum. 5. Once you are standing, make sure you are looking ahead and standing tall.  Repeat at least 5 times (goal is 10 times in a row). Do 3 sets, at least 5 days/week.   To sit down:  1. Back up until you feel the chair behind your legs. 2. Bend at you hips, reaching  Back for you chair, if needed, then slowly squat to sit down on your chair.

## 2016-06-30 ENCOUNTER — Ambulatory Visit: Payer: Medicare Other | Admitting: Physical Therapy

## 2016-06-30 DIAGNOSIS — G20 Parkinson's disease: Secondary | ICD-10-CM

## 2016-06-30 DIAGNOSIS — R2689 Other abnormalities of gait and mobility: Secondary | ICD-10-CM

## 2016-06-30 DIAGNOSIS — M6281 Muscle weakness (generalized): Secondary | ICD-10-CM

## 2016-06-30 NOTE — Therapy (Signed)
[facility] [address] [place], [place], [address] Phone: [phone]   Fax:  [phone]  Physical Therapy Treatment  Patient Details  Name: [name] MRN: [id number] Date of Birth: 04/10/1941 Referring Provider: Alonza Bogus, DO  Encounter Date: 06/30/2016      PT End of Session - 06/30/16 2036    Visit Number 3   Number of Visits 9   Date for PT Re-Evaluation 08/22/16   Authorization Type [employer]-GCODE every 10th visit   PT Start Time 1018   PT Stop Time 1100   PT Time Calculation (min) 42 min   Equipment Utilized During Treatment Gait belt   Activity Tolerance Patient tolerated treatment well   Behavior During Therapy [facility] for tasks assessed/performed      Past Medical History:  Diagnosis Date  . Allergic rhinitis   . Arthritis    "all over"  . CHF (congestive heart failure) ([place])   . Chronic back pain   . DJD (degenerative joint disease)   . GERD (gastroesophageal reflux disease)   . Hyperlipidemia   . Hypertension   . Lumbar stenosis   . OSA (obstructive sleep apnea)    "couldn't handle CPAP; use mouth guard some; not all the time" (01/06/2014)  . PAF (paroxysmal atrial fibrillation) ([place])   . Scoliosis    with radiculopathy L2-S1 with prior surgery  . Small bowel obstruction    versus ileus after last bck surgery  . Spondylosis     Past Surgical History:  Procedure Laterality Date  . BACK SURGERY    . CATARACT EXTRACTION W/ INTRAOCULAR LENS  IMPLANT, BILATERAL  2011  . COLONOSCOPY  12/09/2004  . LIPOMA EXCISION  1980's   "fatty tumors"  . [place] SURGERY  02/2009   "ruptured disc"  . NASAL SEPTUM SURGERY  80's  . POSTERIOR LUMBAR FUSION  06/2010; 10/2011   "placed screws, rods, spacers both times"  . REPAIR DURAL / CSF LEAK  02/2009  . TOTAL ABDOMINAL HYSTERECTOMY  02/1993    There were no vitals filed for this visit.      Subjective Assessment - 06/30/16 1024    Subjective Denies fall since prior visit. Reports one significant LOB when using SBQC (hit her foot on cane) and planted cane to catch her balance. Reports has done some exercises each day, but not always all the exercises.   Patient is accompained by: --   Patient Stated Goals Pt's goal for therapy is to improve walking and reduce falls.   Currently in Pain? No/denies                         [facility] Adult PT Treatment/Exercise - 06/30/16 0001      Transfers   Transfers Sit to Stand;Stand to Sit   Sit to Stand 5: Supervision;4: Min guard;With upper extremity assist;Without upper extremity assist;With armrests;From bed;From chair/3-in-1   Sit to Stand Details Verbal cues for technique;Visual cues for safe use of DME/AE   Sit to Stand Details (indicate cue type and reason) required vc for safe use of DME with sit to stand 20% of time and 90% of time with stand to sit (pt sits uncontrolled with hands on DME, brakes unlocked with rollator, and lands on front edge of seat   Stand to Sit 5: Supervision;4: Min guard;Without upper extremity assist;To bed;To chair/3-in-1;Uncontrolled descent   Stand to Sit Details see above commments   Number of Reps  Other reps (comment)  6   Transfer Cueing Patient recalled prior instructions and utilizing more narrow base of support and did not required repeated attempts to come to stand.     Ambulation/Gait   Ambulation/Gait Yes   Ambulation/Gait Assistance 4: Min guard;5: Supervision   Ambulation/Gait Assistance Details vc for correct placement of SBQC; relaxing LUE (holds tightly to her side to "hold her balance"   Ambulation Distance (Feet) 165 Feet   Assistive device 4-wheeled walker;Small based quad cane   Gait Pattern Step-through pattern;Decreased step length - right;Decreased step length - left;Decreased dorsiflexion - left;Shuffle;Trunk flexed;Wide base of support;Right flexed knee in stance;Left flexed knee in stance;Poor foot clearance -  left   Ambulation Surface Level;Indoor   Stairs --  reviewed sequencing as step on/off red mat x 4     Posture/Postural Control   Postural Limitations Rounded Shoulders;Forward head;Increased thoracic kyphosis     Knee/Hip Exercises: Standing   Hip Flexion AROM;Both;2 sets;10 reps;Knee bent  bil UE support // bars     Ankle Exercises: Seated   Toe Raise 15 reps  AROM with improved ROM     Ankle Exercises: Standing   Heel Raises 10 reps  x 2 sets; bil UE support   Toe Raise 10 reps  x 2 sets; bil UE support             Balance Exercises - 06/30/16 2025      Balance Exercises: Standing   Standing Eyes Opened Wide (BOA);Head turns;Foam/compliant surface;Solid surface;30 secs;3 reps   Standing Eyes Closed Wide (BOA);Head turns;Foam/compliant surface;Solid surface;3 reps;30 secs     OTAGO PROGRAM   Hip ABductor 10 reps   Sideways Walking Assistive device  //bars bil UE support; 10 ft x 2 each direction           PT Education - 06/30/16 2034    Education provided Yes   Education Details Educated on balance and strengthening ex's for HEP; reviewed sit to stand safety tips (including safe use of DME with transfers).   Person(s) Educated Patient   Methods Explanation;Demonstration;Verbal cues;Handout   Comprehension Verbalized understanding;Returned demonstration;Need further instruction             PT Long Term Goals - 06/28/16 1716      PT LONG TERM GOAL #1   Title Pt will perform HEP with husband's supervision, for improved transfers, gait and balance.  TARGET 07/23/16   Time 4   Period Weeks   Status New     PT LONG TERM GOAL #2   Title Pt will improve Berg score to at least 37/56 for decreased fall risk.   Time 4   Period Weeks   Status New     PT LONG TERM GOAL #3   Title Pt will improve gait velocity to at least 2.62 ft/sec for imrpoved gait efficiency and safety.   Time 4   Period Weeks   Status New     PT LONG TERM GOAL #4   Title Pt will  improve TUG score to less than or equal to 15 sec for decreased fall risk.   Time 4   Period Weeks   Status New     PT LONG TERM GOAL #5   Title Pt/family will verbalize understanding of fall prevention in the home environment.     Time 4   Period Weeks   Status New               Plan - 06/30/16  2037    Clinical Impression Statement Skilled interventions focused on balance and strengthening exercises and safe use of rollator vs SBQC during transfers and gait. Patient demonstrating carryover of some education from prior visit and required some repetition (especially related to safe use of DME and gait training). Added to her HEP.    Rehab Potential Good   PT Frequency 2x / week   PT Duration 4 weeks  plus eval   PT Treatment/Interventions ADLs/Self Care Home Management;Functional mobility training;Gait training;DME Instruction;Therapeutic activities;Therapeutic exercise;Balance training;Neuromuscular re-education;Patient/family education   PT Next Visit Plan Review HEP for ?'s; provide fall prevention info; Safe use of DME (with rollator vs SBQC) w/ transfers and gait--simulate home situations, narrow spaces; balance activities; gait training   PT Home Exercise Plan 06/28/16 sit to stand--work on feet shoulder width apart; Lt ankle DF yellow tband; 1/24 standing marching, hip abdct, walking sideways & backwards, SLS bil UE support   Consulted and Agree with Plan of Care Patient      Patient will benefit from skilled therapeutic intervention in order to improve the following deficits and impairments:  Abnormal gait, Decreased balance, Decreased mobility, Decreased safety awareness, Decreased strength, Difficulty walking, Postural dysfunction, Decreased knowledge of use of DME  Visit Diagnosis: Parkinson's disease ([place])  Other abnormalities of gait and mobility  Muscle weakness (generalized)     Problem List Patient Active Problem List   Diagnosis Date Noted  . Cough  04/21/2016  . Medication management 04/18/2015  . Parkinsonism ([place]) 05/15/2014  . Chronic anticoagulation -warfarin therapy 02/03/2014  . Atrial fibrillation with RVR ([place]) 01/31/2014  . Gait instability 11/16/2013  . Encounter for therapeutic drug monitoring 07/20/2013  . Edema 03/03/2010  . ATRIAL FIBRILLATION 04/23/2008  . Elevated lipids 01/31/2008  . Obstructive sleep apnea 01/31/2008  . GLAUCOMA 01/30/2008  . Seasonal and perennial allergic rhinitis 01/30/2008  . DEGENERATIVE JOINT DISEASE 01/30/2008    06/30/2016 Barry Brunner, PT    Rexanne Mano 06/30/2016, 8:52 PM  [facility] [address] [place], [place], [address] Phone: [phone]   Fax:  [phone]  Name: [name] MRN: [id number] Date of Birth: May 01, 1941

## 2016-06-30 NOTE — Patient Instructions (Signed)
Standing Marching   Using a chair if necessary, march in place. Repeat 20 times. Do 1 sessions per day.  http://gt2.exer.us/344   Copyright  VHI. All rights reserved.       Hip Side Kick   Holding a chair for balance, keep legs shoulder width apart and toes pointed forward. Lift a leg out to side, keeping knee straight. Do not lean. Repeat using other leg. Repeat 20 times. Do 1 sessions per day.  http://gt2.exer.us/342   Copyright  VHI. All rights reserved.       Standing On One Leg Without Support .  Stand on one leg in neutral spine without support. Hold 30 seconds. Repeat on other leg. Do 5 repetitions, 1 sets.  http://bt.exer.us/36   Copyright  VHI. All rights reserved.    Side-Stepping   Walk to left side with eyes open. Take even steps, leading with same foot. Make sure each foot lifts off the floor. Repeat in opposite direction. Repeat for 2 minutes per session. Do 1 sessions per day. .  Copyright  VHI. All rights reserved.     AMBULATION: Walk Backward   Walk backward. Take large steps, do not drag feet. Do for 2 minutes, 1 sets per day, 5 days per week Use assistive device. Walk  Copyright  VHI. All rights reserved.

## 2016-07-05 ENCOUNTER — Ambulatory Visit: Payer: Medicare Other | Admitting: Physical Therapy

## 2016-07-05 DIAGNOSIS — M6281 Muscle weakness (generalized): Secondary | ICD-10-CM

## 2016-07-05 DIAGNOSIS — R2689 Other abnormalities of gait and mobility: Secondary | ICD-10-CM | POA: Diagnosis not present

## 2016-07-05 DIAGNOSIS — G20 Parkinson's disease: Secondary | ICD-10-CM

## 2016-07-05 NOTE — Patient Instructions (Signed)
Fall Prevention in the Home Introduction Falls can cause injuries. They can happen to people of all ages. There are many things you can do to make your home safe and to help prevent falls. What can I do on the outside of my home?  Regularly fix the edges of walkways and driveways and fix any cracks.  Remove anything that might make you trip as you walk through a door, such as a raised step or threshold.  Trim any bushes or trees on the path to your home.  Use bright outdoor lighting.  Clear any walking paths of anything that might make someone trip, such as rocks or tools.  Regularly check to see if handrails are loose or broken. Make sure that both sides of any steps have handrails.  Any raised decks and porches should have guardrails on the edges.  Have any leaves, snow, or ice cleared regularly.  Use sand or salt on walking paths during winter.  Clean up any spills in your garage right away. This includes oil or grease spills. What can I do in the bathroom?  Use night lights.  Install grab bars by the toilet and in the tub and shower. Do not use towel bars as grab bars.  Use non-skid mats or decals in the tub or shower.  If you need to sit down in the shower, use a plastic, non-slip stool.  Keep the floor dry. Clean up any water that spills on the floor as soon as it happens.  Remove soap buildup in the tub or shower regularly.  Attach bath mats securely with double-sided non-slip rug tape.  Do not have throw rugs and other things on the floor that can make you trip. What can I do in the bedroom?  Use night lights.  Make sure that you have a light by your bed that is easy to reach.  Do not use any sheets or blankets that are too big for your bed. They should not hang down onto the floor.  Have a firm chair that has side arms. You can use this for support while you get dressed.  Do not have throw rugs and other things on the floor that can make you trip. What can  I do in the kitchen?  Clean up any spills right away.  Avoid walking on wet floors.  Keep items that you use a lot in easy-to-reach places.  If you need to reach something above you, use a strong step stool that has a grab bar.  Keep electrical cords out of the way.  Do not use floor polish or wax that makes floors slippery. If you must use wax, use non-skid floor wax.  Do not have throw rugs and other things on the floor that can make you trip. What can I do with my stairs?  Do not leave any items on the stairs.  Make sure that there are handrails on both sides of the stairs and use them. Fix handrails that are broken or loose. Make sure that handrails are as long as the stairways.  Check any carpeting to make sure that it is firmly attached to the stairs. Fix any carpet that is loose or worn.  Avoid having throw rugs at the top or bottom of the stairs. If you do have throw rugs, attach them to the floor with carpet tape.  Make sure that you have a light switch at the top of the stairs and the bottom of the stairs. If you   do not have them, ask someone to add them for you. What else can I do to help prevent falls?  Wear shoes that:  Do not have high heels.  Have rubber bottoms.  Are comfortable and fit you well.  Are closed at the toe. Do not wear sandals.  If you use a stepladder:  Make sure that it is fully opened. Do not climb a closed stepladder.  Make sure that both sides of the stepladder are locked into place.  Ask someone to hold it for you, if possible.  Clearly mark and make sure that you can see:  Any grab bars or handrails.  First and last steps.  Where the edge of each step is.  Use tools that help you move around (mobility aids) if they are needed. These include:  Canes.  Walkers.  Scooters.  Crutches.  Turn on the lights when you go into a dark area. Replace any light bulbs as soon as they burn out.  Set up your furniture so you have a  clear path. Avoid moving your furniture around.  If any of your floors are uneven, fix them.  If there are any pets around you, be aware of where they are.  Review your medicines with your doctor. Some medicines can make you feel dizzy. This can increase your chance of falling. Ask your doctor what other things that you can do to help prevent falls. This information is not intended to replace advice given to you by your health care provider. Make sure you discuss any questions you have with your health care provider. Document Released: 03/20/2009 Document Revised: 10/30/2015 Document Reviewed: 06/28/2014  2017 Elsevier  

## 2016-07-05 NOTE — Therapy (Signed)
[facility] [address] [place], [place], [address] Phone: [phone]   Fax:  [phone]  Physical Therapy Treatment  Patient Details  Name: [name] MRN: [id number] Date of Birth: August 17, 1940 Referring Provider: Alonza Bogus, DO  Encounter Date: 07/05/2016      PT End of Session - 07/05/16 2106    Visit Number 4   Number of Visits 9   Date for PT Re-Evaluation 08/22/16   Authorization Type Blue Medicare-GCODE every 10th visit   PT Start Time 1014   PT Stop Time 1057   PT Time Calculation (min) 43 min   Equipment Utilized During Treatment Gait belt   Activity Tolerance Patient tolerated treatment well   Behavior During Therapy [facility] for tasks assessed/performed      Past Medical History:  Diagnosis Date  . Allergic rhinitis   . Arthritis    "all over"  . CHF (congestive heart failure) ([place])   . Chronic back pain   . DJD (degenerative joint disease)   . GERD (gastroesophageal reflux disease)   . Hyperlipidemia   . Hypertension   . Lumbar stenosis   . OSA (obstructive sleep apnea)    "couldn't handle CPAP; use mouth guard some; not all the time" (01/06/2014)  . PAF (paroxysmal atrial fibrillation) ([place])   . Scoliosis    with radiculopathy L2-S1 with prior surgery  . Small bowel obstruction    versus ileus after last bck surgery  . Spondylosis     Past Surgical History:  Procedure Laterality Date  . BACK SURGERY    . CATARACT EXTRACTION W/ INTRAOCULAR LENS  IMPLANT, BILATERAL  2011  . COLONOSCOPY  12/09/2004  . LIPOMA EXCISION  1980's   "fatty tumors"  . [place] SURGERY  02/2009   "ruptured disc"  . NASAL SEPTUM SURGERY  80's  . POSTERIOR LUMBAR FUSION  06/2010; 10/2011   "placed screws, rods, spacers both times"  . REPAIR DURAL / CSF LEAK  02/2009  . TOTAL ABDOMINAL HYSTERECTOMY  02/1993    There were no vitals filed for this visit.      Subjective Assessment - 07/05/16 1021     Subjective Patient reports she has been sore/hurting since her last visit. Reports she has been wanting to practice getting up/down off the floor at home so she can then be able to get down into tub and get out again.   Patient Stated Goals Pt's goal for therapy is to improve walking and reduce falls.   Currently in Pain? No/denies                         [facility] Adult PT Treatment/Exercise - 07/05/16 2053      Transfers   Transfers Sit to Stand;Stand to Sit   Sit to Stand 5: Supervision;With upper extremity assist;From bed;From chair/3-in-1   Sit to Stand Details Verbal cues for technique   Stand to Sit 5: Supervision;To bed;To chair/3-in-1;With upper extremity assist   Comments Chair to floor transfer with minguard assist (via tall kneeling, to side-sitting, to long-sitting) and then turning to her right to quadruped, to tall kneeling, to pull up to sit on mat table with verbal cues and minguard assist     Ambulation/Gait   Ambulation/Gait Yes   Ambulation/Gait Assistance 4: Min guard;5: Supervision   Ambulation/Gait Assistance Details vc for incr step length and heelstrike   Ambulation Distance (Feet) 160 Feet   Assistive device Small based  quad cane   Gait Pattern Step-through pattern;Decreased step length - right;Decreased step length - left;Decreased dorsiflexion - left;Shuffle;Trunk flexed;Wide base of support;Right flexed knee in stance;Left flexed knee in stance;Poor foot clearance - left   Ambulation Surface Level;Indoor   Stairs --     Posture/Postural Control   Postural Limitations Rounded Shoulders;Forward head;Increased thoracic kyphosis     Knee/Hip Exercises: Standing   Heel Raises Both;1 set;10 reps   Hip Flexion AROM;Both;2 sets;10 reps;Knee bent  bil UE support // bars     Ankle Exercises: Seated   Toe Raise 15 reps  AROM with improved ROM     Ankle Exercises: Standing   Heel Raises --   Toe Raise --                PT Education -  07/05/16 2103    Education provided Yes   Education Details Fall prevention tips; Floor to chair transfers (simulating sitting down into tub and getting out of tub). Educated re: grab bar that clamps onto side of tub (she has a grab bar, but states she needed something lower). Educated on [employer]ologist, engineering at [employer].   Person(s) Educated Patient   Methods Explanation;Demonstration;Verbal cues;Handout   Comprehension Verbalized understanding;Returned demonstration;Need further instruction             PT Long Term Goals - 06/28/16 1716      PT LONG TERM GOAL #1   Title Pt will perform HEP with husband's supervision, for improved transfers, gait and balance.  TARGET 07/23/16   Time 4   Period Weeks   Status New     PT LONG TERM GOAL #2   Title Pt will improve Berg score to at least 37/56 for decreased fall risk.   Time 4   Period Weeks   Status New     PT LONG TERM GOAL #3   Title Pt will improve gait velocity to at least 2.62 ft/sec for imrpoved gait efficiency and safety.   Time 4   Period Weeks   Status New     PT LONG TERM GOAL #4   Title Pt will improve TUG score to less than or equal to 15 sec for decreased fall risk.   Time 4   Period Weeks   Status New     PT LONG TERM GOAL #5   Title Pt/family will verbalize understanding of fall prevention in the home environment.     Time 4   Period Weeks   Status New               Plan - 07/05/16 2107    Clinical Impression Statement Skilled interventions focused on safe transfers (sit to stand, chair to floor to chair), gait training, and balance exercises. Patient required several rest periods throughout session. During rest periods, educated on fall prevention in the home.    Rehab Potential Good   PT Frequency 2x / week   PT Duration 4 weeks  plus eval   PT Treatment/Interventions ADLs/Self Care Home Management;Functional mobility training;Gait training;DME Instruction;Therapeutic activities;Therapeutic  exercise;Balance training;Neuromuscular re-education;Patient/family education   PT Next Visit Plan Review HEP for ?'s; Safe use of DME (with rollator vs SBQC) w/ transfers and gait--simulate home situations, narrow spaces; balance activities; gait training   PT Home Exercise Plan 06/28/16 sit to stand--work on foot placement shoulder width apart; Lt ankle DF yellow tband; 1/24 standing marching, hip abdct, walking sideways & backwards, SLS bil UE support   Consulted and Agree with  Plan of Care Patient      Patient will benefit from skilled therapeutic intervention in order to improve the following deficits and impairments:  Abnormal gait, Decreased balance, Decreased mobility, Decreased safety awareness, Decreased strength, Difficulty walking, Postural dysfunction, Decreased knowledge of use of DME  Visit Diagnosis: Parkinson's disease ([place])  Other abnormalities of gait and mobility  Muscle weakness (generalized)     Problem List Patient Active Problem List   Diagnosis Date Noted  . Cough 04/21/2016  . Medication management 04/18/2015  . Parkinsonism ([place]) 05/15/2014  . Chronic anticoagulation -warfarin therapy 02/03/2014  . Atrial fibrillation with RVR ([place]) 01/31/2014  . Gait instability 11/16/2013  . Encounter for therapeutic drug monitoring 07/20/2013  . Edema 03/03/2010  . ATRIAL FIBRILLATION 04/23/2008  . Elevated lipids 01/31/2008  . Obstructive sleep apnea 01/31/2008  . GLAUCOMA 01/30/2008  . Seasonal and perennial allergic rhinitis 01/30/2008  . DEGENERATIVE JOINT DISEASE 01/30/2008    Rexanne Mano, PT 07/05/2016, 9:13 PM  [facility] [address] [place], [place], [address] Phone: [phone]   Fax:  [phone]  Name: [name] MRN: [id number] Date of Birth: Nov 30, 1940

## 2016-07-07 ENCOUNTER — Ambulatory Visit: Payer: Medicare Other | Admitting: Physical Therapy

## 2016-07-07 DIAGNOSIS — M6281 Muscle weakness (generalized): Secondary | ICD-10-CM

## 2016-07-07 DIAGNOSIS — G20 Parkinson's disease: Secondary | ICD-10-CM

## 2016-07-07 DIAGNOSIS — R2689 Other abnormalities of gait and mobility: Secondary | ICD-10-CM | POA: Diagnosis not present

## 2016-07-07 NOTE — Therapy (Signed)
[facility] [address] [place], [place], [address] Phone: [phone]   Fax:  [phone]  Physical Therapy Treatment  Patient Details  Name: [name] MRN: [id number] Date of Birth: 02-11-1941 Referring Provider: Alonza Bogus, DO  Encounter Date: 07/07/2016      PT End of Session - 07/07/16 1801    Visit Number 5   Number of Visits 9   Date for PT Re-Evaluation 08/22/16   Authorization Type Blue [employer]-GCODE every 10th visit   PT Start Time 1016   PT Stop Time 1057   PT Time Calculation (min) 41 min   Equipment Utilized During Treatment Gait belt   Activity Tolerance Patient tolerated treatment well   Behavior During Therapy [facility] for tasks assessed/performed      Past Medical History:  Diagnosis Date  . Allergic rhinitis   . Arthritis    "all over"  . CHF (congestive heart failure) ([place])   . Chronic back pain   . DJD (degenerative joint disease)   . GERD (gastroesophageal reflux disease)   . Hyperlipidemia   . Hypertension   . Lumbar stenosis   . OSA (obstructive sleep apnea)    "couldn't handle CPAP; use mouth guard some; not all the time" (01/06/2014)  . PAF (paroxysmal atrial fibrillation) ([place])   . Scoliosis    with radiculopathy L2-S1 with prior surgery  . Small bowel obstruction    versus ileus after last bck surgery  . Spondylosis     Past Surgical History:  Procedure Laterality Date  . BACK SURGERY    . CATARACT EXTRACTION W/ INTRAOCULAR LENS  IMPLANT, BILATERAL  2011  . COLONOSCOPY  12/09/2004  . LIPOMA EXCISION  1980's   "fatty tumors"  . [place] SURGERY  02/2009   "ruptured disc"  . NASAL SEPTUM SURGERY  80's  . POSTERIOR LUMBAR FUSION  06/2010; 10/2011   "placed screws, rods, spacers both times"  . REPAIR DURAL / CSF LEAK  02/2009  . TOTAL ABDOMINAL HYSTERECTOMY  02/1993    There were no vitals filed for this visit.      Subjective Assessment - 07/07/16 1019     Subjective Balance is doing OK. Able to do housework yesterday without the cane (clearing piles off guest bed, dust mop under bed).    Patient Stated Goals Pt's goal for therapy is to improve walking and reduce falls.   Currently in Pain? Yes   Pain Score --  pt unable to rate   Pain Location Scapula   Pain Orientation Left   Pain Descriptors / Indicators Other (Comment)  pulled    Pain Type Acute pain    Monitoring pain, however not the focus of treatment                     [facility] Adult PT Treatment/Exercise - 07/07/16 0001      Transfers   Transfers Sit to Stand;Stand to Sit   Sit to Stand 5: Supervision;Without upper extremity assist   Sit to Stand Details Verbal cues for technique   Sit to Stand Details (indicate cue type and reason) supervision due to standing with no device; cues to narrow BOS   Stand to Sit 5: Supervision   Number of Reps 10 reps;1 set     Ambulation/Gait   Ambulation/Gait Assistance 4: Min assist   Ambulation/Gait Assistance Details hands on guarding 100% of time with no device; pt with no LOB requiring assist, however step  length shortened; with cues able to incr step length x 70 ft and then became fatigued; minimal ability to incr arm swing with vc   Ambulation Distance (Feet) 110 Feet  20, 20   Assistive device None   Gait Pattern Step-through pattern;Decreased step length - right;Decreased step length - left;Decreased dorsiflexion - left;Shuffle;Trunk flexed;Wide base of support;Right flexed knee in stance;Left flexed knee in stance;Poor foot clearance - left;Decreased arm swing - right;Decreased arm swing - left   Ambulation Surface Level;Indoor     Posture/Postural Control   Postural Limitations Rounded Shoulders;Forward head;Increased thoracic kyphosis   Posture Comments instructed in supine scapular adduction with arms out to side x 10     Knee/Hip Exercises: Standing   Hip Flexion AROM;Both;1 set;10 reps  light single UE support    Hip Abduction AROM;Stengthening;Both;2 sets;10 reps  vc for lightening UE support     Knee/Hip Exercises: Seated   Sit to Sand 10 reps;without UE support             Balance Exercises - 07/07/16 1756      Balance Exercises: Standing   SLS Eyes open;Intermittent upper extremity support;2 reps;10 secs;30 secs  bil LE   Retro Gait Upper extremity support;5 reps;Other (comment)  length of // bars; vc for incr step length   Sidestepping 4 reps;Upper extremity support  length of // bars; vc for face sideways           PT Education - 07/07/16 1800    Education provided Yes   Education Details Posture exercise in supine for stretching anterior chest muscles and strengthening scapula adductors.   Person(s) Educated Patient   Methods Explanation;Demonstration;Verbal cues;Handout   Comprehension Verbalized understanding;Returned demonstration;Need further instruction             PT Long Term Goals - 06/28/16 1716      PT LONG TERM GOAL #1   Title Pt will perform HEP with husband's supervision, for improved transfers, gait and balance.  TARGET 07/23/16   Time 4   Period Weeks   Status New     PT LONG TERM GOAL #2   Title Pt will improve Berg score to at least 37/56 for decreased fall risk.   Time 4   Period Weeks   Status New     PT LONG TERM GOAL #3   Title Pt will improve gait velocity to at least 2.62 ft/sec for imrpoved gait efficiency and safety.   Time 4   Period Weeks   Status New     PT LONG TERM GOAL #4   Title Pt will improve TUG score to less than or equal to 15 sec for decreased fall risk.   Time 4   Period Weeks   Status New     PT LONG TERM GOAL #5   Title Pt/family will verbalize understanding of fall prevention in the home environment.     Time 4   Period Weeks   Status New               Plan - 07/07/16 1802    Clinical Impression Statement Patient gradually improving her balance and gait with ability to walk 110 ft without an  assistive device and min assist. Required 3 seated rest breaks throughout session. Will continue to benefit from PT for strength, balance, gait, and posture training to reduce fall risk.    PT Frequency 2x / week   PT Duration 4 weeks  plus eval   PT Treatment/Interventions  ADLs/Self Care Home Management;Functional mobility training;Gait training;DME Instruction;Therapeutic activities;Therapeutic exercise;Balance training;Neuromuscular re-education;Patient/family education   PT Next Visit Plan Review scapula adduction in supine (new to HEP 1/31); transfers and gait with [facility] (she uses inside home; rollator "too big"--simulate home situations, narrow spaces; balance activities; hip abdct strengthening   PT Home Exercise Plan 06/28/16 sit to stand--work on foot placement shoulder width apart; Lt ankle DF yellow tband; 1/24 standing marching, hip abdct, walking sideways & backwards, SLS bil UE support; 1/31 supine scapular adduction with arms outstretched   Consulted and Agree with Plan of Care Patient      Patient will benefit from skilled therapeutic intervention in order to improve the following deficits and impairments:  Abnormal gait, Decreased balance, Decreased mobility, Decreased safety awareness, Decreased strength, Difficulty walking, Postural dysfunction, Decreased knowledge of use of DME  Visit Diagnosis: Parkinson's disease ([place])  Other abnormalities of gait and mobility  Muscle weakness (generalized)     Problem List Patient Active Problem List   Diagnosis Date Noted  . Cough 04/21/2016  . Medication management 04/18/2015  . Parkinsonism ([place]) 05/15/2014  . Chronic anticoagulation -warfarin therapy 02/03/2014  . Atrial fibrillation with RVR ([place]) 01/31/2014  . Gait instability 11/16/2013  . Encounter for therapeutic drug monitoring 07/20/2013  . Edema 03/03/2010  . ATRIAL FIBRILLATION 04/23/2008  . Elevated lipids 01/31/2008  . Obstructive sleep apnea 01/31/2008  .  GLAUCOMA 01/30/2008  . Seasonal and perennial allergic rhinitis 01/30/2008  . DEGENERATIVE JOINT DISEASE 01/30/2008    Rexanne Mano, PT 07/07/2016, 6:10 PM  [facility] [address] [place], [place], [address] Phone: [phone]   Fax:  [phone]  Name: JAYDN HAZEN MRN: [id number] Date of Birth: 1941-05-23

## 2016-07-07 NOTE — Patient Instructions (Signed)
Pectoralis Stretch With Scapula Adduction: Supine (Towel Roll)    Lie on back with flat pillow (as able). Open arms out to your side. Gently squeeze shoulder blades together. Hold for 5 seconds. Do __10_ times, _1__ times per day.  http://ss.exer.us/357   Copyright  VHI. All rights reserved.

## 2016-07-12 ENCOUNTER — Telehealth: Payer: Self-pay | Admitting: Neurology

## 2016-07-12 MED ORDER — CARBIDOPA-LEVODOPA 25-100 MG PO TABS: 2.0000 | ORAL_TABLET | Freq: Four times a day (QID) | ORAL | 0 refills | Status: DC

## 2016-07-12 NOTE — Telephone Encounter (Signed)
PT left a message regarding her prescription but did not say which one/Dawn CB# [phone]

## 2016-07-12 NOTE — Telephone Encounter (Signed)
Patient got a letter from [facility] that they are out of stock of Levodopa until March.  Patient agreed to have RX sent to local [facility]- and will let me know if she has any problems. RX sent.

## 2016-07-13 ENCOUNTER — Ambulatory Visit: Payer: Medicare Other | Attending: Neurology | Admitting: Physical Therapy

## 2016-07-13 DIAGNOSIS — M6281 Muscle weakness (generalized): Secondary | ICD-10-CM | POA: Diagnosis not present

## 2016-07-13 DIAGNOSIS — R2681 Unsteadiness on feet: Secondary | ICD-10-CM | POA: Insufficient documentation

## 2016-07-13 DIAGNOSIS — R293 Abnormal posture: Secondary | ICD-10-CM | POA: Diagnosis not present

## 2016-07-13 DIAGNOSIS — G20 Parkinson's disease: Secondary | ICD-10-CM | POA: Diagnosis not present

## 2016-07-13 DIAGNOSIS — R2689 Other abnormalities of gait and mobility: Secondary | ICD-10-CM | POA: Insufficient documentation
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
## 2016-07-13 NOTE — Patient Instructions (Addendum)
   Added only seated PWR!! Up x 10 reps to HEP

## 2016-07-15 ENCOUNTER — Ambulatory Visit: Payer: Medicare Other | Admitting: Physical Therapy

## 2016-07-15 DIAGNOSIS — M6281 Muscle weakness (generalized): Secondary | ICD-10-CM | POA: Diagnosis not present

## 2016-07-15 DIAGNOSIS — R2681 Unsteadiness on feet: Secondary | ICD-10-CM | POA: Diagnosis not present

## 2016-07-15 DIAGNOSIS — R2689 Other abnormalities of gait and mobility: Secondary | ICD-10-CM | POA: Diagnosis not present

## 2016-07-15 DIAGNOSIS — G20 Parkinson's disease: Secondary | ICD-10-CM | POA: Diagnosis not present

## 2016-07-15 DIAGNOSIS — R293 Abnormal posture: Secondary | ICD-10-CM | POA: Diagnosis not present

## 2016-07-16 DIAGNOSIS — I129 Hypertensive chronic kidney disease with stage 1 through stage 4 chronic kidney disease, or unspecified chronic kidney disease: Secondary | ICD-10-CM | POA: Diagnosis not present

## 2016-07-16 DIAGNOSIS — K219 Gastro-esophageal reflux disease without esophagitis: Secondary | ICD-10-CM | POA: Diagnosis not present

## 2016-07-16 DIAGNOSIS — N183 Chronic kidney disease, stage 3 (moderate): Secondary | ICD-10-CM | POA: Diagnosis not present

## 2016-07-16 DIAGNOSIS — E7800 Pure hypercholesterolemia, unspecified: Secondary | ICD-10-CM | POA: Diagnosis not present

## 2016-07-16 NOTE — Therapy (Signed)
[facility] [address] [place], [place], [address] Phone: [phone]   Fax:  [phone]  Physical Therapy Treatment  Patient Details  Name: [name] MRN: [id number] Date of Birth: 03-15-1941 Referring Provider: Alonza Bogus, DO  Encounter Date: 07/15/2016      PT End of Session - 07/16/16 0958    Visit Number 7   Number of Visits 9   Date for PT Re-Evaluation 08/22/16   Authorization Type Blue [employer]-GCODE every 10th visit   PT Start Time 1315   PT Stop Time 1400   PT Time Calculation (min) 45 min      Past Medical History:  Diagnosis Date  . Allergic rhinitis   . Arthritis    "all over"  . CHF (congestive heart failure) ([place])   . Chronic back pain   . DJD (degenerative joint disease)   . GERD (gastroesophageal reflux disease)   . Hyperlipidemia   . Hypertension   . Lumbar stenosis   . OSA (obstructive sleep apnea)    "couldn't handle CPAP; use mouth guard some; not all the time" (01/06/2014)  . PAF (paroxysmal atrial fibrillation) ([place])   . Scoliosis    with radiculopathy L2-S1 with prior surgery  . Small bowel obstruction    versus ileus after last bck surgery  . Spondylosis     Past Surgical History:  Procedure Laterality Date  . BACK SURGERY    . CATARACT EXTRACTION W/ INTRAOCULAR LENS  IMPLANT, BILATERAL  2011  . COLONOSCOPY  12/09/2004  . LIPOMA EXCISION  1980's   "fatty tumors"  . [place] SURGERY  02/2009   "ruptured disc"  . NASAL SEPTUM SURGERY  80's  . POSTERIOR LUMBAR FUSION  06/2010; 10/2011   "placed screws, rods, spacers both times"  . REPAIR DURAL / CSF LEAK  02/2009  . TOTAL ABDOMINAL HYSTERECTOMY  02/1993    There were no vitals filed for this visit.      Subjective Assessment - 07/15/16 1319    Subjective "I'm absent minded" "I've come a long way I think" - pt states she feels she will be ready for D/C next week   Patient is accompained by: Family member   Patient Stated Goals Pt's goal for therapy is to improve walking and reduce falls.   Currently in Pain? Other (Comment)  some mild soreness in legs and hips                         [facility] Adult PT Treatment/Exercise - 07/16/16 0001      Transfers   Transfers Sit to Stand;Stand to Sit   Sit to Stand 5: Supervision;Without upper extremity assist   Sit to Stand Details Verbal cues for technique   Number of Reps Other reps (comment)  5 reps     Ambulation/Gait   Ambulation/Gait Yes   Ambulation/Gait Assistance 5: Supervision   Ambulation/Gait Assistance Details Pt gait trained 120' with [facility] with verbal cues to increase step length and increase arm swing LUE;  pt then gait trained without device (as pt reports she is not going to use rollator in home); cues to increase step length and to lift LLE with increased initial heel contact                                       Ambulation Distance (Feet)  240 Feet   Assistive device Small based quad cane  No device on 2nd lap   Gait Pattern Step-through pattern;Decreased step length - right;Decreased step length - left;Trunk flexed;Wide base of support;Poor foot clearance - left;Decreased arm swing - left;Poor foot clearance - right  foot clearance improved with rollator and upright posture   Ambulation Surface Level;Indoor     Ankle Exercises: Standing   Heel Raises 10 reps   Toe Raise 10 reps  difficulty dorsiflexing LLE in standing           PWR [facility]) - 07/16/16 0955    PWR Step Pt performed stepping forward, back and side with UE support x 10 reps each   PWR! Up 10 reps - cues for upright posture     Neuro Re-ed; pt performed standing balance exercises with UE with CGA - forward, back and side kicks alternating legs 10 reps each  Marching (cues to lift high for big movement and to stand erect) - 10 reps each Alternate tap ups to 6" step with 1 UE support with CGA             PT Long Term Goals - 06/28/16  1716      PT LONG TERM GOAL #1   Title Pt will perform HEP with husband's supervision, for improved transfers, gait and balance.  TARGET 07/23/16   Time 4   Period Weeks   Status New     PT LONG TERM GOAL #2   Title Pt will improve Berg score to at least 37/56 for decreased fall risk.   Time 4   Period Weeks   Status New     PT LONG TERM GOAL #3   Title Pt will improve gait velocity to at least 2.62 ft/sec for imrpoved gait efficiency and safety.   Time 4   Period Weeks   Status New     PT LONG TERM GOAL #4   Title Pt will improve TUG score to less than or equal to 15 sec for decreased fall risk.   Time 4   Period Weeks   Status New     PT LONG TERM GOAL #5   Title Pt/family will verbalize understanding of fall prevention in the home environment.     Time 4   Period Weeks   Status New               Plan - 07/16/16 [id number]    Clinical Impression Statement Pt reports she is not using rollator in her home - uses SBQC approx. 50% time and holds onto furniture prn approx. 50% time.  Pt has decreased standing tolerance with exercises, requiring frequent seated rest breaks.  Pt also demosntrates foot drop LLE - may benefit from orthosis to increase Lt foot clearance in swing phase of gait.   Rehab Potential Good   PT Frequency 2x / week   PT Duration 4 weeks   PT Treatment/Interventions ADLs/Self Care Home Management;Functional mobility training;Gait training;DME Instruction;Therapeutic activities;Therapeutic exercise;Balance training;Neuromuscular re-education;Patient/family education   PT Next Visit Plan begin checking LTG's - plan to D/C?   PT Home Exercise Plan 06/28/16 sit to stand--work on foot placement shoulder width apart; Lt ankle DF yellow tband; 1/24 standing marching, hip abdct, walking sideways & backwards, SLS bil UE support; 1/31 supine scapular adduction with arms outstretched; 2/6 seated PWR!! up   Consulted and Agree with Plan of Care Patient      Patient  will benefit from skilled therapeutic intervention  in order to improve the following deficits and impairments:  Abnormal gait, Decreased balance, Decreased mobility, Decreased safety awareness, Decreased strength, Difficulty walking, Postural dysfunction, Decreased knowledge of use of DME  Visit Diagnosis: Other abnormalities of gait and mobility  Muscle weakness (generalized)     Problem List Patient Active Problem List   Diagnosis Date Noted  . Cough 04/21/2016  . Medication management 04/18/2015  . Parkinsonism ([place]) 05/15/2014  . Chronic anticoagulation -warfarin therapy 02/03/2014  . Atrial fibrillation with RVR ([place]) 01/31/2014  . Gait instability 11/16/2013  . Encounter for therapeutic drug monitoring 07/20/2013  . Edema 03/03/2010  . ATRIAL FIBRILLATION 04/23/2008  . Elevated lipids 01/31/2008  . Obstructive sleep apnea 01/31/2008  . GLAUCOMA 01/30/2008  . Seasonal and perennial allergic rhinitis 01/30/2008  . DEGENERATIVE JOINT DISEASE 01/30/2008    Alda Lea, PT 07/16/2016, 10:07 AM  [facility] [address] [place], [place], [address] Phone: [phone]   Fax:  [phone]  Name: [name] MRN: [id number] Date of Birth: 09-Oct-1940

## 2016-07-20 ENCOUNTER — Ambulatory Visit (INDEPENDENT_AMBULATORY_CARE_PROVIDER_SITE_OTHER): Payer: Medicare Other | Admitting: *Deleted

## 2016-07-20 ENCOUNTER — Ambulatory Visit: Payer: Medicare Other | Admitting: Physical Therapy

## 2016-07-20 DIAGNOSIS — R2689 Other abnormalities of gait and mobility: Secondary | ICD-10-CM

## 2016-07-20 DIAGNOSIS — I4891 Unspecified atrial fibrillation: Secondary | ICD-10-CM

## 2016-07-20 DIAGNOSIS — R293 Abnormal posture: Secondary | ICD-10-CM | POA: Diagnosis not present

## 2016-07-20 DIAGNOSIS — G20 Parkinson's disease: Secondary | ICD-10-CM | POA: Diagnosis not present

## 2016-07-20 DIAGNOSIS — R2681 Unsteadiness on feet: Secondary | ICD-10-CM | POA: Diagnosis not present

## 2016-07-20 DIAGNOSIS — Z5181 Encounter for therapeutic drug level monitoring: Secondary | ICD-10-CM

## 2016-07-20 DIAGNOSIS — M6281 Muscle weakness (generalized): Secondary | ICD-10-CM | POA: Diagnosis not present

## 2016-07-20 LAB — POCT INR: INR: 2.4

## 2016-07-20 NOTE — Therapy (Signed)
[facility] [address] [place], [place], [address] Phone: [phone]   Fax:  [phone]  Physical Therapy Treatment  Patient Details  Name: [name] MRN: [id number] Date of Birth: Nov 18, 1940 Referring Provider: Alonza Bogus, DO  Encounter Date: 07/20/2016      PT End of Session - 07/20/16 1752    Visit Number 8   Number of Visits 9   Date for PT Re-Evaluation 08/22/16   Authorization Type Blue Medicare-GCODE every 10th visit   PT Start Time 1148   PT Stop Time 1232   PT Time Calculation (min) 44 min   Activity Tolerance Patient tolerated treatment well   Behavior During Therapy [facility] for tasks assessed/performed      Past Medical History:  Diagnosis Date  . Allergic rhinitis   . Arthritis    "all over"  . CHF (congestive heart failure) ([place])   . Chronic back pain   . DJD (degenerative joint disease)   . GERD (gastroesophageal reflux disease)   . Hyperlipidemia   . Hypertension   . Lumbar stenosis   . OSA (obstructive sleep apnea)    "couldn't handle CPAP; use mouth guard some; not all the time" (01/06/2014)  . PAF (paroxysmal atrial fibrillation) ([place])   . Scoliosis    with radiculopathy L2-S1 with prior surgery  . Small bowel obstruction    versus ileus after last bck surgery  . Spondylosis     Past Surgical History:  Procedure Laterality Date  . BACK SURGERY    . CATARACT EXTRACTION W/ INTRAOCULAR LENS  IMPLANT, BILATERAL  2011  . COLONOSCOPY  12/09/2004  . LIPOMA EXCISION  1980's   "fatty tumors"  . [place] SURGERY  02/2009   "ruptured disc"  . NASAL SEPTUM SURGERY  80's  . POSTERIOR LUMBAR FUSION  06/2010; 10/2011   "placed screws, rods, spacers both times"  . REPAIR DURAL / CSF LEAK  02/2009  . TOTAL ABDOMINAL HYSTERECTOMY  02/1993    There were no vitals filed for this visit.      Subjective Assessment - 07/20/16 1149    Subjective Feel like I'm doing better, specifically  with transfers and with walking.  No falls.   Patient Stated Goals Pt's goal for therapy is to improve walking and reduce falls.   Currently in Pain? Yes   Pain Score 1    Pain Location Back   Pain Orientation Right;Lower   Pain Descriptors / Indicators Sharp  at times   Pain Type Chronic pain   Pain Onset More than a month ago   Pain Frequency Intermittent   Aggravating Factors  stretching aggravates, worse when on back   Pain Relieving Factors changing positions                         [facility] Adult PT Treatment/Exercise - 07/20/16 1153      Ambulation/Gait   Ambulation/Gait Yes   Ambulation/Gait Assistance 5: Supervision   Ambulation/Gait Assistance Details Gait using rollator walker, then SBQC.  Advised use of SBQC at home, as she is not planning to use rollator walker in home.   Ambulation Distance (Feet) 100 Feet  x 4 reps   Assistive device Small based quad cane;Rollator   Gait Pattern Step-through pattern;Decreased step length - right;Decreased step length - left;Trunk flexed;Wide base of support;Poor foot clearance - left;Decreased arm swing - left;Poor foot clearance - right  foot clearance improves with rollator and  upright posture   Ambulation Surface Level;Indoor   Gait velocity 13.71 sec rollator (2.39 ft/sec); 16.38 sec (2 ft/sec)     Standardized Balance Assessment   Standardized Balance Assessment Timed Up and Go Test;Berg Balance Test     Berg Balance Test   Sit to Stand Able to stand without using hands and stabilize independently   Standing Unsupported Able to stand safely 2 minutes   Sitting with Back Unsupported but Feet Supported on Floor or Stool Able to sit safely and securely 2 minutes   Stand to Sit Sits safely with minimal use of hands   Transfers Able to transfer safely, minor use of hands   Standing Unsupported with Eyes Closed Able to stand 10 seconds safely   Standing Ubsupported with Feet Together Able to place feet together  independently and stand 1 minute safely   From Standing, Reach Forward with Outstretched Arm Can reach forward >5 cm safely (2")   From Standing Position, Pick up Object from Floor Able to pick up shoe, needs supervision   From Standing Position, Turn to Look Behind Over each Shoulder Turn sideways only but maintains balance   Turn 360 Degrees Able to turn 360 degrees safely but slowly   Standing Unsupported, Alternately Place Feet on Step/Stool Needs assistance to keep from falling or unable to try   Standing Unsupported, One Foot in Front Able to take small step independently and hold 30 seconds   Standing on One Leg Tries to lift leg/unable to hold 3 seconds but remains standing independently   Total Score 40     Timed Up and Go Test   TUG Normal TUG   Normal TUG (seconds) 18.75  rollator, then 20.50 sec SBQC     Self-Care   Self-Care Other Self-Care Comments   Other Self-Care Comments  Fall prevention education provided; reiterated optimal safety rollator walker, but if patient is not using the rollator in home, she needs to use [facility] with husband's supervision.  Discussed POC, plans for discharge and patient is agreeable for discharge this visit.  Answered patient's question regarding Parkinsonism, with PT referring specific questions to Dr. Carles Collet, but also provided information for [facility].     Discussed possible return eval in 6-9 months due to progressive nature of disease; however, pt declines at this time to schedule and will contact physician if any other future PT needs arise.           PT Education - 07/20/16 1751    Education provided Yes   Education Details See self-care; verbal review of HEP-pt able to verbally review exercises as part of  HEP   Person(s) Educated Patient   Methods Explanation   Comprehension Verbalized understanding             PT Long Term Goals - 07/20/16 1752      PT LONG TERM GOAL #1   Title Pt will perform HEP with  husband's supervision, for improved transfers, gait and balance.  TARGET 07/23/16   Time 4   Period Weeks   Status Achieved     PT LONG TERM GOAL #2   Title Pt will improve Berg score to at least 37/56 for decreased fall risk.   Time 4   Period Weeks   Status Achieved     PT LONG TERM GOAL #3   Title Pt will improve gait velocity to at least 2.62 ft/sec for imrpoved gait efficiency and safety.   Time 4   Period Weeks  Status Not Met     PT LONG TERM GOAL #4   Title Pt will improve TUG score to less than or equal to 15 sec for decreased fall risk.   Time 4   Period Weeks   Status Not Met     PT LONG TERM GOAL #5   Title Pt/family will verbalize understanding of fall prevention in the home environment.     Time 4   Period Weeks   Status Achieved               Plan - Aug 04, 2016 1752    Clinical Impression Statement Assessed LTGs this visit, with pt meeting LTG 1, 2, 5.  LTG 3 and 4 not met, with TUG and gait velocity measures similar to eval.  Discussed fall prevention and optimal safety with gait in home.  Pt overall feels therapy has helped and is in agreement with discharge this visit.   Rehab Potential Good   PT Frequency 2x / week   PT Duration 4 weeks   PT Treatment/Interventions ADLs/Self Care Home Management;Functional mobility training;Gait training;DME Instruction;Therapeutic activities;Therapeutic exercise;Balance training;Neuromuscular re-education;Patient/family education   PT Next Visit Plan Discharge PT this visit   PT Home Exercise Plan 06/28/16 sit to stand--work on foot placement shoulder width apart; Lt ankle DF yellow tband; 1/24 standing marching, hip abdct, walking sideways & backwards, SLS bil UE support; 1/31 supine scapular adduction with arms outstretched; 2/6 seated PWR!! up   Consulted and Agree with Plan of Care Patient      Patient will benefit from skilled therapeutic intervention in order to improve the following deficits and impairments:   Abnormal gait, Decreased balance, Decreased mobility, Decreased safety awareness, Decreased strength, Difficulty walking, Postural dysfunction, Decreased knowledge of use of DME  Visit Diagnosis: Other abnormalities of gait and mobility  Unsteadiness on feet       G-Codes - 08-04-2016 1755    Functional Assessment Tool Used TUG 18.75 sec rollator, gait velocity 2.4 ft/sec rollator, Berg 40/56   Functional Limitation Mobility: Walking and moving around   Mobility: Walking and Moving Around Goal Status [phone]) At least 20 percent but less than 40 percent impaired, limited or restricted   Mobility: Walking and Moving Around Discharge Status [phone]) At least 20 percent but less than 40 percent impaired, limited or restricted      Problem List Patient Active Problem List   Diagnosis Date Noted  . Cough 04/21/2016  . Medication management 04/18/2015  . Parkinsonism ([place]) 05/15/2014  . Chronic anticoagulation -warfarin therapy 02/03/2014  . Atrial fibrillation with RVR ([place]) 01/31/2014  . Gait instability 11/16/2013  . Encounter for therapeutic drug monitoring Aug 04, 2013  . Edema 03/03/2010  . ATRIAL FIBRILLATION 04/23/2008  . Elevated lipids 01/31/2008  . Obstructive sleep apnea 01/31/2008  . GLAUCOMA 01/30/2008  . Seasonal and perennial allergic rhinitis 01/30/2008  . DEGENERATIVE JOINT DISEASE 01/30/2008    Kolbie Clarkston W. 2016-08-04, 5:57 PM  Frazier Butt., PT  [address] [place], [place], [address] Phone: [phone]   Fax:  [phone]   PHYSICAL THERAPY DISCHARGE SUMMARY  Visits from Start of Care: 8  Current functional level related to goals / functional outcomes:     PT Long Term Goals - 2016/08/04 1752      PT LONG TERM GOAL #1   Title Pt will perform HEP with husband's supervision, for improved transfers, gait and balance.  TARGET 07/23/16   Time 4   Period Weeks  Status Achieved      PT LONG TERM GOAL #2   Title Pt will improve Berg score to at least 37/56 for decreased fall risk.   Time 4   Period Weeks   Status Achieved     PT LONG TERM GOAL #3   Title Pt will improve gait velocity to at least 2.62 ft/sec for imrpoved gait efficiency and safety.   Time 4   Period Weeks   Status Not Met     PT LONG TERM GOAL #4   Title Pt will improve TUG score to less than or equal to 15 sec for decreased fall risk.   Time 4   Period Weeks   Status Not Met     PT LONG TERM GOAL #5   Title Pt/family will verbalize understanding of fall prevention in the home environment.     Time 4   Period Weeks   Status Achieved       Remaining deficits: Fall risk, decreased balance, abnormal posture   Education / Equipment: HEP, fall prevention  Plan: Patient agrees to discharge.  Patient goals were partially met. Patient is being discharged due to being pleased with the current functional level.  ?????      Name: [name] MRN: [id number] Date of Birth: 1940/09/17  Mady Haagensen, PT 07/20/16 6:01 PM Phone: [phone] Fax: [phone]

## 2016-07-22 ENCOUNTER — Ambulatory Visit: Payer: Medicare Other | Admitting: Physical Therapy

## 2016-08-04 DIAGNOSIS — Z1231 Encounter for screening mammogram for malignant neoplasm of breast: Secondary | ICD-10-CM | POA: Diagnosis not present

## 2016-08-10 ENCOUNTER — Other Ambulatory Visit: Payer: Self-pay | Admitting: Neurology

## 2016-08-10 MED ORDER — CARBIDOPA-LEVODOPA 25-100 MG PO TABS: 2.0000 | ORAL_TABLET | Freq: Four times a day (QID) | ORAL | 0 refills | Status: DC

## 2016-08-13 DIAGNOSIS — H532 Diplopia: Secondary | ICD-10-CM | POA: Diagnosis not present

## 2016-08-13 DIAGNOSIS — H401131 Primary open-angle glaucoma, bilateral, mild stage: Secondary | ICD-10-CM | POA: Diagnosis not present

## 2016-08-13 DIAGNOSIS — H04123 Dry eye syndrome of bilateral lacrimal glands: Secondary | ICD-10-CM | POA: Diagnosis not present

## 2016-08-31 ENCOUNTER — Ambulatory Visit (INDEPENDENT_AMBULATORY_CARE_PROVIDER_SITE_OTHER): Payer: Medicare Other | Admitting: *Deleted

## 2016-08-31 DIAGNOSIS — Z5181 Encounter for therapeutic drug level monitoring: Secondary | ICD-10-CM | POA: Diagnosis not present

## 2016-08-31 DIAGNOSIS — I4891 Unspecified atrial fibrillation: Secondary | ICD-10-CM

## 2016-08-31 LAB — POCT INR: INR: 2.3

## 2016-09-04 DIAGNOSIS — J069 Acute upper respiratory infection, unspecified: Secondary | ICD-10-CM | POA: Diagnosis not present

## 2016-09-09 ENCOUNTER — Other Ambulatory Visit: Payer: Self-pay | Admitting: *Deleted

## 2016-09-09 MED ORDER — FLECAINIDE ACETATE 50 MG PO TABS: 50.0000 mg | ORAL_TABLET | Freq: Two times a day (BID) | ORAL | 1 refills | Status: DC

## 2016-09-09 NOTE — Progress Notes (Signed)
Patient ID: [name], female   DOB: 05-12-1941, 76 y.o.   MRN: [id number]    Cardiology Office Note   Date:  09/10/2016   ID:  [name], DOB [birth year], MRN [id number]  PCP:  Simona Huh, MD  Cardiologist:  [name]    Chief Complaint  Patient presents with  . PAF      History of Present Illness: [name] is a 76 y.o. female who presents for follow up of HTN and PAF.  She is on coumadin followed by our office.  She has hx of a heart catheterization in Feb. 2002 that demonstrated normal coronary arteries and normal LV function. A Myoview study done in July 2009 demonstrated no ischemia. An echocardiogram done in July 2012 demonstrated normal systolic function with an EF of 55-60%.   Echo 2015 revealing normal LV function with an EF of 60-65%. Wall motion was normal; there were no regional wall motion abnormalities. The left atrium was normal in size.    Myovue 9/15 normal EF no ischemia  Flecainide added 04/03/14.  Is followed by Neuro for parkinson's.  Has been fitted for a brace on lt leg.  Uses a walker to ambulate.  No chest pain, no SOB.  No awareness of palpitations.  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 Allergic rhinitis   . Arthritis    "all over"  . CHF (congestive heart failure) ([place])   . Chronic back pain   . DJD (degenerative joint disease)   . GERD (gastroesophageal reflux disease)   . Hyperlipidemia   . Hypertension   . Lumbar stenosis   . OSA (obstructive sleep apnea)    "couldn't handle CPAP; use mouth guard some; not all the  time" (01/06/2014)  . PAF (paroxysmal atrial fibrillation) ([place])   . Scoliosis    with radiculopathy L2-S1 with prior surgery  . Small bowel obstruction    versus ileus after last bck surgery  . Spondylosis     Past Surgical History:  Procedure Laterality Date  . BACK SURGERY    . CATARACT EXTRACTION W/ INTRAOCULAR LENS  IMPLANT, BILATERAL  2011  . COLONOSCOPY  12/09/2004  . LIPOMA EXCISION  1980's   "fatty tumors"  . [place] SURGERY  02/2009   "ruptured disc"  . NASAL SEPTUM SURGERY  80's  . POSTERIOR LUMBAR FUSION  06/2010; 10/2011   "placed screws, rods, spacers both times"  . REPAIR DURAL / CSF LEAK  02/2009  . TOTAL ABDOMINAL HYSTERECTOMY  02/1993     Current Outpatient Prescriptions  Medication Sig Dispense Refill  . acetaminophen (TYLENOL) 500 MG tablet Take 1,000 mg by mouth daily as needed (pain).    . carbidopa-levodopa (SINEMET IR) 25-100 MG tablet Take 2 tablets by mouth 4 (four) times daily. 240 tablet 0  . fexofenadine (ALLEGRA) 180 MG tablet Take 180 mg by mouth as needed. For allergies    . flecainide (TAMBOCOR) 50 MG tablet Take 1 tablet (50 mg total) by mouth 2 (two) times daily. 180 tablet  1  . furosemide (LASIX) 20 MG tablet Take 20 mg by mouth 2 (two) times daily.     . Magnesium 400 MG TABS Take 400 mg by mouth at bedtime.    Marland Kitchen MEGARED OMEGA-3 KRILL OIL 500 MG CAPS Take 500 mg by mouth daily.    . metolazone (ZAROXOLYN) 5 MG tablet Take 5 mg by mouth daily.    . metoprolol succinate (TOPROL-XL) 100 MG 24 hr tablet Take 1 tablet (100 mg total) by mouth daily. 90 tablet 3  . potassium chloride SA (K-DUR,KLOR-CON) 20 MEQ tablet Take 40 mEq by mouth daily.     . pramipexole (MIRAPEX) 0.5 MG tablet Take 0.5 mg by mouth at bedtime. For restless legs    . pravastatin (PRAVACHOL) 40 MG tablet Take 40 mg by mouth at bedtime.     . TRAVATAN Z 0.004 % SOLN ophthalmic solution   0  . warfarin (COUMADIN) 5 MG tablet Take as directed by coumadin clinic (Patient taking  differently: Take 2.5 mg by mouth daily. Take as directed by coumadin clinic) 60 tablet 1   No current facility-administered medications for this visit.     Allergies:   Lyrica [pregabalin]; Zocor [simvastatin]; and Relafen [nabumetone]    Social History:  The patient  reports that she has never smoked. She has never used smokeless tobacco. She reports that she does not drink alcohol or use drugs.   Family History:  The patient's family history includes Allergies in her sister; Arthritis in her mother; Asthma in her sister; Heart attack in her father; Hypertension in her sister; Lung cancer in her sister.    ROS:  General:no colds or fevers, incr. of weight - not exercising Skin:no rashes or ulcers HEENT:no blurred vision, no congestion CV:see HPI PUL:see HPI GI:no diarrhea constipation or melena, no indigestion GU:no hematuria, no dysuria- for routine colonoscopy next week MS:no joint pain, no claudication- getting a brace for Lt leg, using a walker Neuro:no syncope, no lightheadedness Endo:no diabetes, no thyroid disease  Wt Readings from Last 3 Encounters:  09/10/16 195 lb 12.8 oz (88.8 kg)  05/12/16 199 lb (90.3 kg)  04/21/16 200 lb 9.6 oz (91 kg)     PHYSICAL EXAM: VS:  BP 138/60   Pulse 74   Ht 5\' 6"  (1.676 m)   Wt 195 lb 12.8 oz (88.8 kg)   SpO2 98%   BMI 31.60 kg/m  , BMI Body mass index is 31.6 kg/m. General:Pleasant affect, NAD Skin:Warm and dry, brisk capillary refill HEENT:normocephalic, sclera clear, mucus membranes moist Neck:supple, no JVD, no bruits  Heart:S1S2 RRR without murmur, gallup, rub or click Lungs:clear without rales, rhonchi, or wheezes VOH:YWVP, non tender, + BS, do not palpate liver spleen or masses Ext:no lower ext edema, 2+ pedal pulses, 2+ radial pulses Neuro:masked facies broad based gait stiffness in legs/arms Parkinson's     EKG:   03/24/15 SR with PAC poor R wave progression , no acute changes otherwise.  03/10/16 SR rate 67  nonspecific ST changes   Recent Labs: No results found for requested labs within last 8760 hours.    Lipid Panel    Component Value Date/Time   CHOL  12/22/2006 0550    183        ATP III CLASSIFICATION:  <200     mg/dL   Desirable  200-239  mg/dL   Borderline High  >=240    mg/dL   High   TRIG 174 (H) 12/22/2006 0550   HDL 47  12/22/2006 0550   CHOLHDL 3.9 12/22/2006 0550   VLDL 35 12/22/2006 0550   LDLCALC (H) 12/22/2006 0550    101        Total Cholesterol/HDL:CHD Risk Coronary Heart Disease Risk Table                     Men   Women  1/2 Average Risk   3.4   3.3       Other studies Reviewed: Additional studies/ records that were reviewed today include: previous notes and labs.   ASSESSMENT AND PLAN:  1.  PAF- maintaining SR.  On flecainide.  Anticoagulation on coumadin, with INR RX  Has been held In past with no lovenox bridge [facility] 2   2. HTN  Home readings fine some white coat component does best with diuretic on board  3.  Hyperlipidemia checked with PCP in August with T chol 150, LDL 70, HDL 56.  She will continue with pravachol.  4. Parkinson's  Seen by Dr Carles Collet 05/12/16 .  Concern that some of her leg weakness and dificulty Walking is from chronic back problems. Had surgeries with Dr Luiz Ochoa in past. 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 Bronchitis: on amoxacillin sounds ok f/u primary   Current medicines are reviewed with the patient today.  The patient Has no concerns regarding medicines.   Signed, Jenkins Rouge, MD  09/10/2016 10:00 AM    [place], [place], [place] Phone: [phone]; Fax: [phone]

## 2016-09-10 ENCOUNTER — Ambulatory Visit (INDEPENDENT_AMBULATORY_CARE_PROVIDER_SITE_OTHER): Payer: Medicare Other | Admitting: Cardiovascular Disease

## 2016-09-10 ENCOUNTER — Encounter: Payer: Self-pay | Admitting: Cardiovascular Disease

## 2016-09-10 VITALS — BP 138/60 | HR 74 | Ht 66.0 in | Wt 195.8 lb

## 2016-09-10 DIAGNOSIS — I480 Paroxysmal atrial fibrillation: Secondary | ICD-10-CM

## 2016-09-10 DIAGNOSIS — I10 Essential (primary) hypertension: Secondary | ICD-10-CM | POA: Diagnosis not present

## 2016-09-10 NOTE — Patient Instructions (Signed)

## 2016-09-15 NOTE — Progress Notes (Signed)
[name] was seen today in the movement disorders clinic for neurologic consultation at the request of [name].  Her PCP is Simona Huh, MD.  The consultation is for the evaluation of PD.  This patient is accompanied in the office by her spouse who supplements the history.  I have reviewed prior records made available to me.  Extensive record review was done on 04/15/2016 and took 45 minutes.  The patient has seen multiple physicians at [facility] neurology, including Dr. [name] (only for EMG), Dr. [name], Dr. [name] and Dr. [name].  The patient was first seen by Dr. [name] in 2014 and subsequently had an EMG by Dr. [name].  The primary complaint at that point was knee pain and she was diagnosed with a painful peripheral neuropathy.  In June, 2015 she followed up with Dr. [name] and was complaining about dizziness and gait instability.  Dr. [name] felt that perhaps this was due to cervical degenerative changes.  When she came back for follow-up, Dr. [name] had left the practice and Dr. [name] had resumed her care.  Dr. [name] felt that her symptoms were consistent with parkinsonism and she was started on levodopa in December, 2015.  She was started on half a tablet 3 times per day.  Initial follow-up notes more month later indicate that the patient thought that the medication was helpful.  In February, 2016 she was started on Azilect but it was expensive and she thought that it caused swelling and so pt d/c it.  However, once she d/c she thought that she got worse so she restarted it.  In July, 2016 it was attempted to discontinue the levodopa and try her on Neupro patch.  The patient felt that she did worse with this and she stopped patch.  The patient saw Dr. [name] in May, 2017 for a second opinion.  Dr. [name] stated "she does not give any sinister atypical history for atypical parkinsonism but does not have a very strong history or telltale exam for idiopathic Parkinson's disease either.  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   Specific Symptoms:  Tremor: No. Family hx of similar:  No. Voice: patients husband thinks that voice is same; pt thinks that it is deeper Sleep: patient states that she doesn't sleep well (but states that it is because of husbands electric blanket makes her hot)  Vivid Dreams:  Yes.    Acting out dreams:  Yes.  , screams out x several years but no falling out of the bed Wet Pillows: Yes.   Postural symptoms:  Yes.   x 3-4 years and thinks that perhaps due to back surgeries.  Used walker x several years  Falls? Only one time in march 2017 - trying to put something on bed and fell; few other times bent down and "eased down" to floor (for example at file cabinet) Bradykinesia symptoms: slow movements, difficulty getting out of a chair and small steps Loss of smell:  No. Loss of taste:  No. Urinary Incontinence:  Yes.   x 1 year (cannot get to bathroom fast enough) Difficulty Swallowing:  :not really" Handwriting, micrographia: Yes.   Trouble with ADL's:  Yes.   but better than she could (trouble with socks)  Trouble buttoning clothing: No. Depression:  No. Memory changes:  No. Hallucinations:  No.  visual distortions: No.,  but does have some floaters and sees Dr. [name] N/V:  No. Lightheaded:  No.  Syncope: No. Diplopia:  Yes.   x 3-4 months (horizontal, goes away if closes eye, comes and goes, doesn't know if time of day, doesn't know if better if after nap; sometimes gets told to open L eye wider but not sure if has ptosis) Dyskinesia:  No.  Neuroimaging has previously been performed.  It is available for my review today.  I reviewed the patient's MRI of the brain dated 11/28/2013.  There was mild atrophy and a few T2 hyperintensities.  05/11/16 update:  Pt presents for levodopa challenge test.   She has been off of levodopa since 4:30 pm.  She has also held her pramipexole, 0.5 mg q hs that she takes for RLS.  Her legs are burning.  Her legs feel restless.  She doesn't feel more stiff off of medication.  Her main issues off of drug were at night (RLS).    09/17/16 update:  Patient presents today.  She is on carbidopa/levodopa 25/100, 2 tablets 4 times per day.  She is also on pramipexole 0.5 mg at night.  She did go to physical therapy since our last visit.  Those notes were reviewed.  She had a modified barium swallow since our last visit that was normal.  Pt denies falls.  Pt denies lightheadedness, near syncope.  Husband states that she talks in her sleep.  One day she thought she had a hallucination and "something" ran in front of her but never recurred.  Mood has been good per patient and husband states it has been "off and on."  PREVIOUS MEDICATIONS: Sinemet, Mirapex and neupro  ALLERGIES:   Allergies  Allergen Reactions  . Lyrica [Pregabalin] Other (See Comments)    Felt loopy  . Zocor [Simvastatin] Other (See Comments)    Myalgias  . Relafen [Nabumetone] Rash    CURRENT MEDICATIONS:  Outpatient Encounter Prescriptions as of 09/17/2016  Medication Sig  . acetaminophen (TYLENOL) 500 MG tablet Take 1,000 mg by mouth daily as needed (pain).  . carbidopa-levodopa (SINEMET IR) 25-100 MG tablet Take 2 tablets by mouth 4 (four) times daily.  . fexofenadine (ALLEGRA) 180 MG tablet Take 180 mg by mouth as needed. For allergies  . flecainide (TAMBOCOR) 50 MG tablet Take 1 tablet (50 mg total) by mouth 2 (two) times daily.  . furosemide (LASIX) 20 MG tablet Take 20 mg by mouth 2 (two) times daily.   . Magnesium 400 MG TABS Take 400 mg by mouth at bedtime.  Marland Kitchen MEGARED OMEGA-3 KRILL OIL 500 MG CAPS Take 500 mg by mouth daily.  . metoprolol succinate (TOPROL-XL) 100 MG 24 hr tablet Take 1 tablet (100 mg total) by mouth daily.  . potassium chloride SA (K-DUR,KLOR-CON) 20 MEQ tablet Take 40  mEq by mouth daily.   . pramipexole (MIRAPEX) 0.5 MG tablet Take 0.5 mg by mouth at bedtime. For restless legs  . pravastatin (PRAVACHOL) 40 MG tablet Take 40 mg by mouth at bedtime.   . TRAVATAN Z 0.004 % SOLN ophthalmic solution   . warfarin (COUMADIN) 5 MG tablet Take as directed by coumadin clinic (Patient taking differently: Take 2.5 mg by mouth daily. Take as directed by coumadin clinic)  . metolazone (ZAROXOLYN) 5 MG tablet Take 5 mg by mouth daily.   No facility-administered encounter medications on file as of 09/17/2016.     PAST MEDICAL HISTORY:   Past Medical History:  Diagnosis Date  .  Allergic rhinitis   . Arthritis    "all over"  . CHF (congestive heart failure) ([place])   . Chronic back pain   . DJD (degenerative joint disease)   . GERD (gastroesophageal reflux disease)   . Hyperlipidemia   . Hypertension   . Lumbar stenosis   . OSA (obstructive sleep apnea)    "couldn't handle CPAP; use mouth guard some; not all the time" (01/06/2014)  . PAF (paroxysmal atrial fibrillation) ([place])   . Scoliosis    with radiculopathy L2-S1 with prior surgery  . Small bowel obstruction    versus ileus after last bck surgery  . Spondylosis     PAST SURGICAL HISTORY:   Past Surgical History:  Procedure Laterality Date  . BACK SURGERY    . CATARACT EXTRACTION W/ INTRAOCULAR LENS  IMPLANT, BILATERAL  2011  . COLONOSCOPY  12/09/2004  . LIPOMA EXCISION  1980's   "fatty tumors"  . [place] SURGERY  02/2009   "ruptured disc"  . NASAL SEPTUM SURGERY  80's  . POSTERIOR LUMBAR FUSION  06/2010; 10/2011   "placed screws, rods, spacers both times"  . REPAIR DURAL / CSF LEAK  02/2009  . TOTAL ABDOMINAL HYSTERECTOMY  02/1993    SOCIAL HISTORY:   Social History   Social History  . Marital status: Married    Spouse name: [name]  . Number of children: 2  . Years of education: 12th    Occupational History  . retired Retired    Radio producer work   Social History Main Topics  . Smoking  status: Never Smoker  . Smokeless tobacco: Never Used  . Alcohol use No     Comment: 02/01/2014 " glasseof wine once in a blue moon; < once/year"  . Drug use: No  . Sexual activity: No     Comment: hysterectomy   Other Topics Concern  . Not on file   Social History Narrative   Patient lives at home with husband [name].   Patient is retired.   Patient has a high school education.    Patient has 2 children     FAMILY HISTORY:   Family Status  Relation Status  . Mother Deceased at age 64   natural causes  . Father Deceased at age 48   MI  . Maternal Grandmother Deceased  . Maternal Grandfather Deceased  . Paternal Grandmother Deceased  . Paternal Grandfather Deceased  . Sister Deceased  . Sister   . Sister   . Daughter Alive    ROS:  A complete 10 system review of systems was obtained and was unremarkable apart from what is mentioned above.  PHYSICAL EXAMINATION:    VITALS:   Vitals:   09/17/16 1025  BP: 110/64  Pulse: 76  Weight: 196 lb (88.9 kg)  Height: 5' 5.5" (1.664 m)    GEN:  The patient appears stated age and is in NAD. HEENT:  Normocephalic, atraumatic.  The mucous membranes are moist. The superficial temporal arteries are without ropiness or tenderness. CV:  RRR Lungs:  CTAB Neck/HEME:  There are no carotid bruits bilaterally.  Neurological examination:  Orientation: The patient is alert and oriented x3. Fund of knowledge is appropriate.  Recent and remote memory are intact.  Attention and concentration are normal.    Able to name objects and repeat phrases. Cranial nerves: There is good facial symmetry. 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  Pupils are equal round and minimally reactive to light bilaterally.  Funduscopic exam is attempted, but  disc margins are not well visualized.  Extraocular muscles are intact but there is mild downgaze paresis.  She does have a few square wave jerks.  The visual fields are full to confrontational testing. The speech is fluent and  clear but she has a slight pseudobulbar quality to the speech.  She does not have significant difficulty with guttural sounds.  Soft palate rises symmetrically and there is no tongue deviation. Hearing is intact to conversational tone. Sensation: Sensation is intact to light touch throughout Motor: Strength is 5/5 in the bilateral upper and lower extremities.   Shoulder shrug is equal and symmetric.  There is no pronator drift.  No fasciculations noted.  Movement examination:  Tone: There is normal tone in the bilateral upper extremities.  The tone in the lower extremities is normal.  Abnormal movements: None Coordination:  There is decremation with RAM's, especially with toe taps on the L but also with alternation of supination/pronation of forearm on the L.   Gait and Station: The patient has significant difficulty arising out of a deep-seated chair without the use of the hands.  She is able to arise on the 3rd attempt without using the hands and then shows me she can do that a few times.  The patient's stride length is decreased with almost no arm swing bilaterally and then remembers what the PT showed her and has purposeful arm swing.    LABS:   Labs received from primary care physician from 07/16/2016.  Sodium was 142, potassium 3.6, chloride 103, CO2 30, BUN 19, creatinine 1.05.  AST 14, ALT 4, glucose 87.  ASSESSMENT/PLAN:  1.  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  She has some square wave jerks, diplopia (within the first 3 years of onset), and mild downgaze paresis.  We talked about nature, etiology and pathophysiology.   -Levodopa challenge test was done today and levodopa was only of mild benefit.  However, the patient does think that it is helpful and it did help the rigidity in the left arm.  In addition, it helped her restless  leg as does the pramipexole.  - Continue carbidopa/levodopa 25/100, 2 tablets 4 times per day.  She will continue her pramipexole, 0.5 mg at night that she takes for restless leg.  -PT did help her and encouraged her to continue exercising  -Modified barium swallow on 05/21/2016 was normal.   2.  Much greater than 50% of this 30 minute visit was spent in counseling with the patient and her husband.  This did not include the 25 min wait time for levodopa to kick in.    Cc:  Simona Huh, MD

## 2016-09-17 ENCOUNTER — Ambulatory Visit (INDEPENDENT_AMBULATORY_CARE_PROVIDER_SITE_OTHER): Payer: Medicare Other | Admitting: Neurology

## 2016-09-17 ENCOUNTER — Encounter: Payer: Self-pay | Admitting: Neurology

## 2016-09-17 VITALS — BP 110/64 | HR 76 | Ht 65.5 in | Wt 196.0 lb

## 2016-09-17 DIAGNOSIS — G231 Progressive supranuclear ophthalmoplegia [Steele-Richardson-Olszewski]: Secondary | ICD-10-CM | POA: Diagnosis not present

## 2016-10-11 ENCOUNTER — Ambulatory Visit (INDEPENDENT_AMBULATORY_CARE_PROVIDER_SITE_OTHER): Payer: Medicare Other

## 2016-10-11 DIAGNOSIS — Z5181 Encounter for therapeutic drug level monitoring: Secondary | ICD-10-CM | POA: Diagnosis not present

## 2016-10-11 DIAGNOSIS — I4891 Unspecified atrial fibrillation: Secondary | ICD-10-CM

## 2016-10-11 LAB — POCT INR: INR: 2.1

## 2016-10-21 DIAGNOSIS — L282 Other prurigo: Secondary | ICD-10-CM | POA: Diagnosis not present

## 2016-11-05 ENCOUNTER — Other Ambulatory Visit: Payer: Self-pay | Admitting: *Deleted

## 2016-11-05 MED ORDER — WARFARIN SODIUM 5 MG PO TABS: ORAL_TABLET | ORAL | 1 refills | Status: DC

## 2016-11-15 ENCOUNTER — Ambulatory Visit: Payer: Medicare Other | Admitting: Neurology

## 2016-11-23 ENCOUNTER — Ambulatory Visit (INDEPENDENT_AMBULATORY_CARE_PROVIDER_SITE_OTHER): Payer: Medicare Other | Admitting: *Deleted

## 2016-11-23 ENCOUNTER — Telehealth: Payer: Self-pay | Admitting: Neurology

## 2016-11-23 DIAGNOSIS — I4891 Unspecified atrial fibrillation: Secondary | ICD-10-CM | POA: Diagnosis not present

## 2016-11-23 DIAGNOSIS — Z5181 Encounter for therapeutic drug level monitoring: Secondary | ICD-10-CM

## 2016-11-23 LAB — POCT INR: INR: 2.4

## 2016-11-23 NOTE — Telephone Encounter (Signed)
PT called and said she has a question about her carbadopa

## 2016-11-23 NOTE — Telephone Encounter (Signed)
Patient called. She states she received a refill of Levodopa but it was under Dr. [name]. I told her to call our office when she needs a refill. She probably just called in a refill to the pharmacy off an old prescription. RX written for 1.5 QID with extra tab as needed. She knows to take 2 QID.

## 2016-12-17 DIAGNOSIS — H43811 Vitreous degeneration, right eye: Secondary | ICD-10-CM | POA: Diagnosis not present

## 2016-12-17 DIAGNOSIS — H401131 Primary open-angle glaucoma, bilateral, mild stage: Secondary | ICD-10-CM | POA: Diagnosis not present

## 2016-12-17 DIAGNOSIS — H26493 Other secondary cataract, bilateral: Secondary | ICD-10-CM | POA: Diagnosis not present

## 2016-12-17 DIAGNOSIS — H04123 Dry eye syndrome of bilateral lacrimal glands: Secondary | ICD-10-CM | POA: Diagnosis not present

## 2017-01-03 DIAGNOSIS — L820 Inflamed seborrheic keratosis: Secondary | ICD-10-CM | POA: Diagnosis not present

## 2017-01-03 DIAGNOSIS — D492 Neoplasm of unspecified behavior of bone, soft tissue, and skin: Secondary | ICD-10-CM | POA: Diagnosis not present

## 2017-01-04 ENCOUNTER — Ambulatory Visit (INDEPENDENT_AMBULATORY_CARE_PROVIDER_SITE_OTHER): Payer: Medicare Other | Admitting: *Deleted

## 2017-01-04 DIAGNOSIS — Z5181 Encounter for therapeutic drug level monitoring: Secondary | ICD-10-CM | POA: Diagnosis not present

## 2017-01-04 DIAGNOSIS — I4891 Unspecified atrial fibrillation: Secondary | ICD-10-CM | POA: Diagnosis not present

## 2017-01-04 LAB — POCT INR: INR: 2.9

## 2017-01-14 DIAGNOSIS — E7800 Pure hypercholesterolemia, unspecified: Secondary | ICD-10-CM | POA: Diagnosis not present

## 2017-01-14 DIAGNOSIS — N183 Chronic kidney disease, stage 3 (moderate): Secondary | ICD-10-CM | POA: Diagnosis not present

## 2017-01-14 DIAGNOSIS — Z0000 Encounter for general adult medical examination without abnormal findings: Secondary | ICD-10-CM | POA: Diagnosis not present

## 2017-01-14 DIAGNOSIS — I129 Hypertensive chronic kidney disease with stage 1 through stage 4 chronic kidney disease, or unspecified chronic kidney disease: Secondary | ICD-10-CM | POA: Diagnosis not present

## 2017-01-19 NOTE — Progress Notes (Signed)
[name] was seen today in the movement disorders clinic for neurologic consultation at the request of [name].  Her PCP is [name] Arabian, MD.  The consultation is for the evaluation of PD.  This patient is accompanied in the office by her spouse who supplements the history.  I have reviewed prior records made available to me.  Extensive record review was done on 04/15/2016 and took 45 minutes.  The patient has seen multiple physicians at [facility] neurology, including Dr. [name] (only for EMG), Dr. [name], Dr. [name] and Dr. [name].  The patient was first seen by Dr. [name] in 2014 and subsequently had an EMG by Dr. [name].  The primary complaint at that point was knee pain and she was diagnosed with a painful peripheral neuropathy.  In June, 2015 she followed up with Dr. [name] and was complaining about dizziness and gait instability.  Dr. [name] felt that perhaps this was due to cervical degenerative changes.  When she came back for follow-up, Dr. [name] had left the practice and Dr. [name] had resumed her care.  Dr. [name] felt that her symptoms were consistent with parkinsonism and she was started on levodopa in December, 2015.  She was started on half a tablet 3 times per day.  Initial follow-up notes more month later indicate that the patient thought that the medication was helpful.  In February, 2016 she was started on Azilect but it was expensive and she thought that it caused swelling and so pt d/c it.  However, once she d/c she thought that she got worse so she restarted it.  In July, 2016 it was attempted to discontinue the levodopa and try her on Neupro patch.  The patient felt that she did worse with this and she stopped patch.  The patient saw Dr. [name] in May, 2017 for a second opinion.  Dr. [name] stated "she does not give any sinister atypical history for atypical parkinsonism but does not have a very strong history or telltale exam for idiopathic Parkinson's disease either.  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   Specific Symptoms:  Tremor: No. Family hx of similar:  No. Voice: patients husband thinks that voice is same; pt thinks that it is deeper Sleep: patient states that she doesn't sleep well (but states that it is because of husbands electric blanket makes her hot)  Vivid Dreams:  Yes.    Acting out dreams:  Yes.  , screams out x several years but no falling out of the bed Wet Pillows: Yes.   Postural symptoms:  Yes.   x 3-4 years and thinks that perhaps due to back surgeries.  Used walker x several years  Falls? Only one time in march 2017 - trying to put something on bed and fell; few other times bent down and "eased down" to floor (for example at file cabinet) Bradykinesia symptoms: slow movements, difficulty getting out of a chair and small steps Loss of smell:  No. Loss of taste:  No. Urinary Incontinence:  Yes.   x 1 year (cannot get to bathroom fast enough) Difficulty Swallowing:  :not really" Handwriting, micrographia: Yes.   Trouble with ADL's:  Yes.   but better than she could (trouble with socks)  Trouble buttoning clothing: No. Depression:  No. Memory changes:  No. Hallucinations:  No.  visual distortions: No.,  but does have some floaters and sees Dr. [name] N/V:  No. Lightheaded:  No.  Syncope: No. Diplopia:  Yes.   x 3-4 months (horizontal, goes away if closes eye, comes and goes, doesn't know if time of day, doesn't know if better if after nap; sometimes gets told to open L eye wider but not sure if has ptosis) Dyskinesia:  No.  Neuroimaging has previously been performed.  It is available for my review today.  I reviewed the patient's MRI of the brain dated 11/28/2013.  There was mild atrophy and a few T2 hyperintensities.  05/11/16 update:  Pt presents for levodopa challenge test.   She has been off of levodopa since 4:30 pm.  She has also held her pramipexole, 0.5 mg q hs that she takes for RLS.  Her legs are burning.  Her legs feel restless.  She doesn't feel more stiff off of medication.  Her main issues off of drug were at night (RLS).    09/17/16 update:  Patient presents today.  She is on carbidopa/levodopa 25/100, 2 tablets 4 times per day.  She is also on pramipexole 0.5 mg at night.  She did go to physical therapy since our last visit.  Those notes were reviewed.  She had a modified barium swallow since our last visit that was normal.  Pt denies falls.  Pt denies lightheadedness, near syncope.  Husband states that she talks in her sleep.  One day she thought she had a hallucination and "something" ran in front of her but never recurred.  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  This patient is accompanied in the office by her her husband who supplements the history.  She is on carbidopa/levodopa 25/100, 2 po qid. She is also on pramipexole 0.5 mg at night, primarily for restless leg syndrome.  States that a few times a week, she gets up in the middle of the night and takes an extra.  The records that were made available to me were reviewed.  Pt denies falls.  Pt denies lightheadedness, near syncope.  No hallucinations.  Mood is "fair" until her husband "gets upset."  States that her husband was dx with mild dementia and she is having to take care of him.  admts she is not using her walker/cane faithfully.  No diplopia.    PREVIOUS MEDICATIONS: Sinemet, Mirapex and neupro  ALLERGIES:   Allergies  Allergen Reactions  . Lyrica [Pregabalin] Other (See Comments)    Felt loopy  . Zocor [Simvastatin] Other (See Comments)    Myalgias  . Relafen [Nabumetone] Rash    CURRENT MEDICATIONS:  Outpatient Encounter Prescriptions as of 01/21/2017  Medication Sig  .  acetaminophen (TYLENOL) 500 MG tablet Take 1,000 mg by mouth daily as needed (pain).  . carbidopa-levodopa (SINEMET IR) 25-100 MG tablet Take 2 tablets by mouth 4 (four) times daily.  . fexofenadine (ALLEGRA) 180 MG tablet Take 180 mg by mouth as needed. For allergies  . flecainide (TAMBOCOR) 50 MG tablet Take 1 tablet (50 mg total) by mouth 2 (two) times daily.  . furosemide (LASIX) 20 MG tablet Take 20 mg by mouth 2 (two) times daily.   . Magnesium 400 MG TABS Take 400 mg by mouth at bedtime.  Marland Kitchen MEGARED OMEGA-3 KRILL OIL 500 MG CAPS Take 500 mg by mouth daily.  . metolazone (ZAROXOLYN) 5 MG  tablet Take 5 mg by mouth daily.  . metoprolol succinate (TOPROL-XL) 100 MG 24 hr tablet Take 1 tablet (100 mg total) by mouth daily.  . potassium chloride SA (K-DUR,KLOR-CON) 20 MEQ tablet Take 40 mEq by mouth 2 (two) times daily.   . pramipexole (MIRAPEX) 0.5 MG tablet Take 0.5 mg by mouth at bedtime. For restless legs  . pravastatin (PRAVACHOL) 40 MG tablet Take 40 mg by mouth at bedtime.   . TRAVATAN Z 0.004 % SOLN ophthalmic solution   . warfarin (COUMADIN) 5 MG tablet Take as directed by coumadin clinic   No facility-administered encounter medications on file as of 01/21/2017.     PAST MEDICAL HISTORY:   Past Medical History:  Diagnosis Date  . Allergic rhinitis   . Arthritis    "all over"  . CHF (congestive heart failure) ([place])   . Chronic back pain   . DJD (degenerative joint disease)   . GERD (gastroesophageal reflux disease)   . Hyperlipidemia   . Hypertension   . Lumbar stenosis   . OSA (obstructive sleep apnea)    "couldn't handle CPAP; use mouth guard some; not all the time" (01/06/2014)  . PAF (paroxysmal atrial fibrillation) ([place])   . Scoliosis    with radiculopathy L2-S1 with prior surgery  . Small bowel obstruction (HCC)    versus ileus after last bck surgery  . Spondylosis     PAST SURGICAL HISTORY:   Past Surgical History:  Procedure Laterality Date  . BACK SURGERY      . CATARACT EXTRACTION W/ INTRAOCULAR LENS  IMPLANT, BILATERAL  2011  . COLONOSCOPY  12/09/2004  . LIPOMA EXCISION  1980's   "fatty tumors"  . [place] SURGERY  02/2009   "ruptured disc"  . NASAL SEPTUM SURGERY  80's  . POSTERIOR LUMBAR FUSION  06/2010; 10/2011   "placed screws, rods, spacers both times"  . REPAIR DURAL / CSF LEAK  02/2009  . TOTAL ABDOMINAL HYSTERECTOMY  02/1993    SOCIAL HISTORY:   Social History   Social History  . Marital status: Married    Spouse name: [name]  . Number of children: 2  . Years of education: 12th    Occupational History  . retired Retired    Radio producer work   Social History Main Topics  . Smoking status: Never Smoker  . Smokeless tobacco: Never Used  . Alcohol use No     Comment: 02/01/2014 " glasseof wine once in a blue moon; < once/year"  . Drug use: No  . Sexual activity: No     Comment: hysterectomy   Other Topics Concern  . Not on file   Social History Narrative   Patient lives at home with husband [name].   Patient is retired.   Patient has a high school education.    Patient has 2 children     FAMILY HISTORY:   Family Status  Relation Status  . Mother Deceased at age 54       natural causes  . Father Deceased at age 35       MI  . MGM Deceased  . MGF Deceased  . PGM Deceased  . PGF Deceased  . Sister Deceased  . Sister (Not Specified)  . Sister (Not Specified)  . Daughter Alive    ROS:  A complete 10 system review of systems was obtained and was unremarkable apart from what is mentioned above.  PHYSICAL EXAMINATION:    VITALS:   Vitals:  01/21/17 1023  BP: 112/68  Pulse: 74  SpO2: 97%  Weight: 192 lb (87.1 kg)  Height: 5\' 6"  (1.676 m)    GEN:  The patient appears stated age and is in NAD. HEENT:  Normocephalic, atraumatic.  The mucous membranes are moist. The superficial temporal arteries are without ropiness or tenderness. CV:  RRR Lungs:  CTAB Neck/HEME:  There are no carotid bruits  bilaterally.  Neurological examination:  Orientation: The patient is alert and oriented x3.  Cranial nerves: There is good facial symmetry.  Extraocular muscles are intact but there is mild downgaze paresis (same as prior).  She does have a few square wave jerks.  The visual fields are full to confrontational testing. The speech is fluent and clear but she has a slight pseudobulbar quality to the speech.  She does not have significant difficulty with guttural sounds.  Soft palate rises symmetrically and there is no tongue deviation. Hearing is intact to conversational tone. Sensation: Sensation is intact to light touch throughout Motor: Strength is 5/5 in the bilateral upper and lower extremities.   Shoulder shrug is equal and symmetric.  There is no pronator drift.  No fasciculations noted.  Movement examination:  Tone: she has trouble relaxing (ge[place]halten).   Abnormal movements: mild dyskinesia in the left leg Coordination:  There is decremation with RAM's, especially with toe taps on the L but also with alternation of supination/pronation of forearm on the L.   Gait and Station: The patient has significant difficulty arising out of a deep-seated chair without the use of the hands.  She is able to arise on the 3rd attempt without using the hands and then shows me she can do that a few times.  The patient's stride length is decreased with almost no arm swing bilaterally and then remembers what the PT showed her and has purposeful arm swing.    LABS:   Labs received from primary care physician from 07/16/2016.  Sodium was 142, potassium 3.6, chloride 103, CO2 30, BUN 19, creatinine 1.05.  AST 14, ALT 4, glucose 87.  ASSESSMENT/PLAN:  1.  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  She has some square wave jerks, diplopia (within the first 3  years of onset), and mild downgaze paresis.  We talked about nature, etiology and pathophysiology.   -Levodopa challenge test was done and levodopa was only of mild benefit.  However, the patient does think that it is helpful and it did help the rigidity in the left arm.  In addition, it helped her restless leg as does the pramipexole.  - Continue carbidopa/levodopa 25/100, 2 tablets 4 times per day.  She will continue her pramipexole, 0.5 mg at night that she takes for restless leg.  -PT did help her and encouraged her to continue exercising  -told patient she needed to use walker at all times  -Modified barium swallow on 05/21/2016 was normal.  -talked about need for future planning, especially given that husband recently dx with dementia   2.  Much greater than 50% of this visit was spent in counseling and coordinating care.  Total face to face time:  25 min     Cc:  [name] Arabian, MD

## 2017-01-21 ENCOUNTER — Encounter: Payer: Self-pay | Admitting: Neurology

## 2017-01-21 ENCOUNTER — Ambulatory Visit (INDEPENDENT_AMBULATORY_CARE_PROVIDER_SITE_OTHER): Payer: Medicare Other | Admitting: Neurology

## 2017-01-21 VITALS — BP 112/68 | HR 74 | Ht 66.0 in | Wt 192.0 lb

## 2017-01-21 DIAGNOSIS — G231 Progressive supranuclear ophthalmoplegia [Steele-Richardson-Olszewski]: Secondary | ICD-10-CM | POA: Diagnosis not present

## 2017-01-21 MED ORDER — CARBIDOPA-LEVODOPA 25-100 MG PO TABS: 2.0000 | ORAL_TABLET | Freq: Four times a day (QID) | ORAL | 5 refills | Status: DC

## 2017-01-21 MED ORDER — CARBIDOPA-LEVODOPA 25-100 MG PO TABS: 2.0000 | ORAL_TABLET | Freq: Four times a day (QID) | ORAL | 1 refills | Status: DC

## 2017-01-31 DIAGNOSIS — E876 Hypokalemia: Secondary | ICD-10-CM | POA: Diagnosis not present

## 2017-02-03 DIAGNOSIS — H43811 Vitreous degeneration, right eye: Secondary | ICD-10-CM | POA: Diagnosis not present

## 2017-02-03 DIAGNOSIS — H04123 Dry eye syndrome of bilateral lacrimal glands: Secondary | ICD-10-CM | POA: Diagnosis not present

## 2017-02-03 DIAGNOSIS — H401131 Primary open-angle glaucoma, bilateral, mild stage: Secondary | ICD-10-CM | POA: Diagnosis not present

## 2017-02-03 DIAGNOSIS — H26491 Other secondary cataract, right eye: Secondary | ICD-10-CM | POA: Diagnosis not present

## 2017-02-15 ENCOUNTER — Ambulatory Visit (INDEPENDENT_AMBULATORY_CARE_PROVIDER_SITE_OTHER): Payer: Medicare Other | Admitting: *Deleted

## 2017-02-15 DIAGNOSIS — Z5181 Encounter for therapeutic drug level monitoring: Secondary | ICD-10-CM | POA: Diagnosis not present

## 2017-02-15 DIAGNOSIS — I4891 Unspecified atrial fibrillation: Secondary | ICD-10-CM | POA: Diagnosis not present

## 2017-02-15 LAB — POCT INR: INR: 3.3

## 2017-02-24 ENCOUNTER — Other Ambulatory Visit: Payer: Self-pay | Admitting: *Deleted

## 2017-02-24 DIAGNOSIS — H26491 Other secondary cataract, right eye: Secondary | ICD-10-CM | POA: Diagnosis not present

## 2017-02-24 DIAGNOSIS — H26492 Other secondary cataract, left eye: Secondary | ICD-10-CM | POA: Diagnosis not present

## 2017-02-24 MED ORDER — FLECAINIDE ACETATE 50 MG PO TABS: 50.0000 mg | ORAL_TABLET | Freq: Two times a day (BID) | ORAL | 1 refills | Status: DC

## 2017-02-24 NOTE — Addendum Note (Signed)
Addended by: Juventino Slovak on: 02/24/2017 04:58 PM   Modules accepted: Orders

## 2017-03-01 ENCOUNTER — Ambulatory Visit (INDEPENDENT_AMBULATORY_CARE_PROVIDER_SITE_OTHER): Payer: Medicare Other | Admitting: *Deleted

## 2017-03-01 DIAGNOSIS — Z5181 Encounter for therapeutic drug level monitoring: Secondary | ICD-10-CM

## 2017-03-01 DIAGNOSIS — I4891 Unspecified atrial fibrillation: Secondary | ICD-10-CM

## 2017-03-01 LAB — POCT INR: INR: 2

## 2017-03-18 NOTE — Progress Notes (Signed)
Patient ID: [name], female   DOB: [birth year], 76 y.o.   MRN: [id number]    Cardiology Office Note   Date:  03/22/2017   ID:  [name], DOB 03-Dec-1940, MRN [id number]  PCP:  [name], MD  Cardiologist:  [name]    No chief complaint on file.     History of Present Illness: [name] is a 76 y.o. female who presents for follow up of HTN and PAF.  She is on coumadin followed by our office.  She has hx of a heart catheterization in Feb. 2002 that demonstrated normal coronary arteries and normal LV function.   Echo 2015 revealing normal LV function with an EF of 60-65%. Wall motion was normal; there were no regional wall motion abnormalities. The left atrium was normal in size.    Myovue 9/15 normal EF no ischemia  Flecainide added 04/03/14.  Is followed by Neuro for parkinson's.  Has been fitted for a brace on lt leg.  Uses a walker to ambulate.  No chest pain, no SOB.  No awareness of palpitations.  Her husband also a patient of mine  Had a heart transplant at [facility] years ago   Has seen Dr Tat for movement disorder and may have supranuclear palsy rather than parkinson's along with her RLS  Son [name] sick in hospital recently with C diff and Diverticulitis   Past Medical History:  Diagnosis Date  . Allergic rhinitis   . Arthritis    "all over"  . CHF (congestive heart failure) ([place])   . Chronic back pain   . DJD (degenerative joint disease)   . GERD (gastroesophageal reflux disease)   . Hyperlipidemia   . Hypertension   . Lumbar stenosis   . OSA (obstructive sleep apnea)    "couldn't handle CPAP; use mouth guard some; not all the time" (01/06/2014)  . PAF (paroxysmal atrial fibrillation) ([place])   . Scoliosis    with radiculopathy L2-S1 with prior surgery  . Small bowel obstruction (HCC)    versus ileus after last bck surgery  . Spondylosis     Past Surgical History:  Procedure Laterality Date  . BACK SURGERY    . CATARACT  EXTRACTION W/ INTRAOCULAR LENS  IMPLANT, BILATERAL  2011  . COLONOSCOPY  12/09/2004  . LIPOMA EXCISION  1980's   "fatty tumors"  . [place] SURGERY  02/2009   "ruptured disc"  . NASAL SEPTUM SURGERY  80's  . POSTERIOR LUMBAR FUSION  06/2010; 10/2011   "placed screws, rods, spacers both times"  . REPAIR DURAL / CSF LEAK  02/2009  . TOTAL ABDOMINAL HYSTERECTOMY  02/1993     Current Outpatient Prescriptions  Medication Sig Dispense Refill  . acetaminophen (TYLENOL) 500 MG tablet Take 1,000 mg by mouth daily as needed (pain).    . carbidopa-levodopa (SINEMET IR) 25-100 MG tablet Take 2 tablets by mouth 4 (four) times daily. 720 tablet 1  . fexofenadine (ALLEGRA) 180 MG tablet Take 180 mg by mouth as needed. For allergies    . flecainide (TAMBOCOR) 50 MG tablet Take 1 tablet (50 mg total) by mouth 2 (two) times daily. 180 tablet 1  . furosemide (LASIX) 20 MG tablet Take 20 mg by mouth 2 (two) times daily.     . Magnesium 400 MG TABS Take 400 mg by mouth at bedtime.    Marland Kitchen MEGARED OMEGA-3 KRILL OIL 500 MG CAPS Take 500 mg by mouth daily.    . metolazone (ZAROXOLYN)  5 MG tablet Take 5 mg by mouth daily.    . metoprolol succinate (TOPROL-XL) 100 MG 24 hr tablet Take 1 tablet (100 mg total) by mouth daily. 90 tablet 3  . potassium chloride SA (K-DUR,KLOR-CON) 20 MEQ tablet Take 40 mEq by mouth 2 (two) times daily.     . pramipexole (MIRAPEX) 0.5 MG tablet Take 0.5 mg by mouth at bedtime. For restless legs    . pravastatin (PRAVACHOL) 40 MG tablet Take 40 mg by mouth at bedtime.     . TRAVATAN Z 0.004 % SOLN ophthalmic solution   0  . warfarin (COUMADIN) 5 MG tablet Take as directed by coumadin clinic 60 tablet 1  . Zoster Vaccine Adjuvanted [facility]) injection Inject as directed once.  0   No current facility-administered medications for this visit.     Allergies:   Lyrica [pregabalin]; Zocor [simvastatin]; and Relafen [nabumetone]    Social History:  The patient  reports that she has never  smoked. She has never used smokeless tobacco. She reports that she does not drink alcohol or use drugs.   Family History:  The patient's family history includes Allergies in her sister; Arthritis in her mother; Asthma in her sister; Heart attack in her father; Hypertension in her sister; Lung cancer in her sister.    ROS:  General:no colds or fevers, incr. of weight - not exercising Skin:no rashes or ulcers HEENT:no blurred vision, no congestion CV:see HPI PUL:see HPI GI:no diarrhea constipation or melena, no indigestion GU:no hematuria, no dysuria- for routine colonoscopy next week MS:no joint pain, no claudication- getting a brace for Lt leg, using a walker Neuro:no syncope, no lightheadedness Endo:no diabetes, no thyroid disease  Wt Readings from Last 3 Encounters:  03/22/17 196 lb 1.9 oz (89 kg)  01/21/17 192 lb (87.1 kg)  09/17/16 196 lb (88.9 kg)     PHYSICAL EXAM: VS:  BP 130/64   Pulse 69   Ht 5\' 6"  (1.676 m)   Wt 196 lb 1.9 oz (89 kg)   SpO2 99%   BMI 31.65 kg/m  , BMI Body mass index is 31.65 kg/m. Affect appropriate Overweight white female  HEENT: normal Neck supple with no adenopathy JVP normal no bruits no thyromegaly Lungs clear with no wheezing and good diaphragmatic motion Heart:  S1/S2 no murmur, no rub, gallop or click PMI normal Abdomen: benighn, BS positve, no tenderness, no AAA no bruit.  No HSM or HJR Distal pulses intact with no bruits No edema Broad based gait unsteady on feet masked facies  Skin warm and dry No muscular weakness     EKG:   03/24/15 SR with PAC poor R wave progression , no acute changes otherwise.  03/10/16 SR rate 67 nonspecific ST changes   Recent Labs: No results found for requested labs within last 8760 hours.    Lipid Panel    Component Value Date/Time   CHOL  12/22/2006 0550    183        ATP III CLASSIFICATION:  <200     mg/dL   Desirable  200-239  mg/dL   Borderline High  >=240    mg/dL   High   TRIG 174  (H) 12/22/2006 0550   HDL 47 12/22/2006 0550   CHOLHDL 3.9 12/22/2006 0550   VLDL 35 12/22/2006 0550   LDLCALC (H) 12/22/2006 0550    101        Total Cholesterol/HDL:CHD Risk Coronary Heart Disease Risk Table  Men   Women  1/2 Average Risk   3.4   3.3       Other studies Reviewed: Additional studies/ records that were reviewed today include: previous notes and labs.   ASSESSMENT AND PLAN:  1.  PAF- maintaining SR.  On flecainide.  Anticoagulation on coumadin, with INR RX  Has been held In past with no lovenox bridge CHADVASC 2   2. HTN  Home readings fine some white coat component does best with diuretic on board  3.  Hyperlipidemia checked with PCP in August with T chol 150, LDL 70, HDL 56.  She will continue with pravachol.  4. Parkinson's  Seen by Dr Carles Collet 05/12/16 .  Concern that some of her leg weakness and dificulty Walking is from chronic back problems. Had surgeries with Dr Luiz Ochoa in past. She thinks she may have [place]

## 2017-03-22 ENCOUNTER — Ambulatory Visit (INDEPENDENT_AMBULATORY_CARE_PROVIDER_SITE_OTHER): Payer: Medicare Other | Admitting: *Deleted

## 2017-03-22 ENCOUNTER — Encounter: Payer: Self-pay | Admitting: Cardiovascular Disease

## 2017-03-22 ENCOUNTER — Ambulatory Visit (INDEPENDENT_AMBULATORY_CARE_PROVIDER_SITE_OTHER): Payer: Medicare Other | Admitting: Cardiovascular Disease

## 2017-03-22 VITALS — BP 130/64 | HR 69 | Ht 66.0 in | Wt 196.1 lb

## 2017-03-22 DIAGNOSIS — Z5181 Encounter for therapeutic drug level monitoring: Secondary | ICD-10-CM

## 2017-03-22 DIAGNOSIS — I4891 Unspecified atrial fibrillation: Secondary | ICD-10-CM | POA: Diagnosis not present

## 2017-03-22 DIAGNOSIS — I480 Paroxysmal atrial fibrillation: Secondary | ICD-10-CM

## 2017-03-22 LAB — POCT INR: INR: 3

## 2017-03-22 NOTE — Patient Instructions (Signed)

## 2017-03-29 DIAGNOSIS — R197 Diarrhea, unspecified: Secondary | ICD-10-CM | POA: Diagnosis not present

## 2017-03-30 DIAGNOSIS — Z23 Encounter for immunization: Secondary | ICD-10-CM | POA: Diagnosis not present

## 2017-04-01 DIAGNOSIS — R197 Diarrhea, unspecified: Secondary | ICD-10-CM | POA: Diagnosis not present

## 2017-04-07 ENCOUNTER — Ambulatory Visit (INDEPENDENT_AMBULATORY_CARE_PROVIDER_SITE_OTHER): Payer: Medicare Other | Admitting: *Deleted

## 2017-04-07 DIAGNOSIS — Z5181 Encounter for therapeutic drug level monitoring: Secondary | ICD-10-CM | POA: Diagnosis not present

## 2017-04-07 DIAGNOSIS — I4891 Unspecified atrial fibrillation: Secondary | ICD-10-CM | POA: Diagnosis not present

## 2017-04-07 LAB — POCT INR: INR: 2.7

## 2017-04-19 ENCOUNTER — Other Ambulatory Visit: Payer: Self-pay | Admitting: Internal Medicine

## 2017-04-19 ENCOUNTER — Other Ambulatory Visit: Payer: Medicare Other

## 2017-04-19 DIAGNOSIS — A0472 Enterocolitis due to Clostridium difficile, not specified as recurrent: Secondary | ICD-10-CM | POA: Diagnosis not present

## 2017-04-19 NOTE — Progress Notes (Signed)
Upcoming visit for clostridium difficile recurrence

## 2017-04-20 LAB — C. DIFFICILE GDH AND TOXIN A/B
GDH ANTIGEN: DETECTED
MICRO NUMBER:: 81279614
SPECIMEN QUALITY:: ADEQUATE
TOXIN A AND B: DETECTED

## 2017-04-20 LAB — TIQ-NTM

## 2017-04-21 ENCOUNTER — Other Ambulatory Visit: Payer: Self-pay | Admitting: Internal Medicine

## 2017-04-21 ENCOUNTER — Ambulatory Visit: Payer: Medicare Other | Admitting: Internal Medicine

## 2017-04-21 ENCOUNTER — Telehealth: Payer: Self-pay | Admitting: *Deleted

## 2017-04-21 NOTE — Progress Notes (Signed)
Patient reports not having any diarrhea yesterday or today. She reports that they formed. She believes that she gets loose stools/diarrhea when she eats turnip greens. For now ,we will hold on starting back on cdifficile treatment, clinically it does not appear to have cdiff. She will call if she has recurrence of 4+ watery stools/day

## 2017-04-21 NOTE — Telephone Encounter (Signed)
I spoke with patient. Not having anymore diarrhea. Will not need treatment at this time

## 2017-04-21 NOTE — Telephone Encounter (Signed)
Call from [facility] stating patient had a positive c-diff stool culture. Note routed to MD.

## 2017-05-04 ENCOUNTER — Telehealth: Payer: Self-pay | Admitting: Internal Medicine

## 2017-05-04 ENCOUNTER — Encounter: Payer: Self-pay | Admitting: Internal Medicine

## 2017-05-04 ENCOUNTER — Ambulatory Visit: Payer: Medicare Other | Admitting: Internal Medicine

## 2017-05-04 VITALS — BP 124/76 | HR 72 | Ht 65.0 in | Wt 193.6 lb

## 2017-05-04 DIAGNOSIS — J069 Acute upper respiratory infection, unspecified: Secondary | ICD-10-CM | POA: Diagnosis not present

## 2017-05-04 DIAGNOSIS — J208 Acute bronchitis due to other specified organisms: Secondary | ICD-10-CM

## 2017-05-04 DIAGNOSIS — G4733 Obstructive sleep apnea (adult) (pediatric): Secondary | ICD-10-CM

## 2017-05-04 DIAGNOSIS — J209 Acute bronchitis, unspecified: Secondary | ICD-10-CM

## 2017-05-04 MED ORDER — DOXYCYCLINE HYCLATE 100 MG PO TABS: ORAL_TABLET | ORAL | 0 refills | Status: DC

## 2017-05-04 MED ORDER — AZITHROMYCIN 250 MG PO TABS: ORAL_TABLET | ORAL | 0 refills | Status: DC

## 2017-05-04 MED ORDER — METHYLPREDNISOLONE ACETATE 80 MG/ML IJ SUSP
80.0000 mg | Freq: Once | INTRAMUSCULAR | Status: AC
  Administered 2017-05-04: 80 mg via INTRAMUSCULAR

## 2017-05-04 MED ORDER — LEVALBUTEROL HCL 0.63 MG/3ML IN NEBU
0.6300 mg | INHALATION_SOLUTION | Freq: Once | RESPIRATORY_TRACT | Status: AC
  Administered 2017-05-04: 0.63 mg via RESPIRATORY_TRACT

## 2017-05-04 NOTE — Assessment & Plan Note (Signed)
Not using any therapy now.  Remote sleep study had indicated only minimal OSA.

## 2017-05-04 NOTE — Patient Instructions (Signed)
Script sent for doxycycline     This may interact with some of your medicines, including warfarin. See if your coumadin clinic would like you to put off the Pro Time lab check for awhile.  Order neb xop 0.63    Dx acute upper respiratory infection             Depo 80

## 2017-05-04 NOTE — Assessment & Plan Note (Signed)
Illness began as a viral pattern URI but seems to be progressing towards a bacterial syndrome. Plan-doxycycline, Mucinex DM, Depo-Medrol, nebulizer treatments Xopenex, hydration

## 2017-05-04 NOTE — Telephone Encounter (Signed)
Erica returned call.  I read her the note and she states this is what she told the patient to do, take doxy RX and not the Zpak and the patient is aware of this.  States she does not need a call back.

## 2017-05-04 NOTE — Progress Notes (Signed)
Patient ID: [name], female    DOB: 1940/09/24, 76 y.o.   MRN: [id number]  HPI F never smoker followed for allergic rhinitis, hx sleep apnea/ oral appliance, complicated by AtrialFib, parkinsonism, glaucoma, NPSG 01/25/08- AHI 6/ hr Oral appliance from Dr. Ron Parker  -----------------------------------------------------------------------------------  04/21/2016-76 year old female never smoker followed for Allergic rhinitis, History OSA/Oral Appliance, complicated by Atrial fib/warfarin, parkinsonism, glaucoma Allergy Vaccine 1:10 GO FOLLOWS FOR:Pt states she ran out of allergy vaccine for about 1 month-can not tell a difference being off of injections. Pt no longer uses oral appliance at night. Morning cough with clear phlegm worries her was her sister had lung cancer. No wheezing. Being treated for Parkinson's.  05/04/17- 76 year old female never smoker followed for Allergic rhinitis, History OSA/Oral Appliance, complicated by Atrial fib/warfarin, parkinsonism, glaucoma Cough; Pt states she had been doing well until Sunday-cough-worse at night, itchy throat. Cough is productive-yellow in color. Denies any fever or chills.  Acute illness times 3 days-significant fatigue, sputum getting darker.  Denies blood, high fever, hard chills, adenopathy or GI upset. She quit oral appliance for OSA because it was uncomfortable.  Previously had failed to tolerate CPAP. CXR 04/21/16-NAD  Review of Systems- see HPI + = positive Constitutional:   No-   weight loss, night sweats, fevers, chills, fatigue, lassitude. HEENT:   No-  headaches, difficulty swallowing, tooth/dental problems, sore throat,       No-  sneezing, itching, ear ache, nasal congestion, post nasal drip,  CV:  No-   chest pain, orthopnea, PND, swelling in lower extremities, anasarca, dizziness, palpitations Resp: No-   shortness of breath with exertion or at rest.            +  productive cough,  No non-productive cough,  No-  coughing up of blood.              No-   change in color of mucus.  No- wheezing.   Skin: No-   rash or lesions. GI:  No-   heartburn, indigestion, abdominal pain, nausea, vomiting,  GU:  MS:  No-   joint pain or swelling.   Neuro-     +Parkinson's Psych:  No- change in mood or affect. No depression or anxiety.  No memory loss.  Objective:   Physical Exam General- Alert, Oriented, Affect-appropriate, Distress- none acute.  + Overweight  Skin- rash-none, lesions- none, excoriation- none Lymphadenopathy- none Head- atraumatic            Eyes- Gross vision intact, PERRLA, conjunctivae clear secretions            Ears- Hearing, canals-normal            Nose- Clear, no-Septal dev, mucus, polyps, erosion, perforation             Throat- Mallampati II-III , mucosa clear , drainage- none, tonsils- atrophic Neck- flexible , trachea midline, no stridor , thyroid nl, carotid no bruit Chest - symmetrical excursion , unlabored           Heart/CV- IRR/ no pacer , no murmur , no gallop  , no rub, nl s1 s2                           - JVD- none , edema- none, stasis changes- none, varices- none           Lung-  wheeze- none, cough+ rattle , dullness-none, rub- none  Chest wall-  Abd-  Br/ Gen/ Rectal- Not done, not indicated Extrem- cyanosis- none, clubbing, none, atrophy- none, strength- nl. + Rolling walker Neuro- +flat facies, speech a little slow, rolling walker           Is

## 2017-05-04 NOTE — Telephone Encounter (Signed)
It appears that both doxy and zpak were called in during today's visit. It's appears that zpak was d/c on epic but not with the pharmacy.  I have spoken with with CY- CY confirmed that doxy sig is correct, and that pt should not take zpak.  Lm for [name] to make her aware.

## 2017-05-09 ENCOUNTER — Ambulatory Visit (INDEPENDENT_AMBULATORY_CARE_PROVIDER_SITE_OTHER): Payer: Medicare Other | Admitting: *Deleted

## 2017-05-09 DIAGNOSIS — I4891 Unspecified atrial fibrillation: Secondary | ICD-10-CM

## 2017-05-09 DIAGNOSIS — Z5181 Encounter for therapeutic drug level monitoring: Secondary | ICD-10-CM

## 2017-05-09 LAB — POCT INR: INR: 2.5

## 2017-05-09 NOTE — Patient Instructions (Signed)
Continue taking 1/2 tablet daily.  Recheck INR in 6 weeks. Call if placed on any new medications  [phone]

## 2017-05-12 ENCOUNTER — Other Ambulatory Visit: Payer: Self-pay | Admitting: *Deleted

## 2017-05-12 MED ORDER — METOPROLOL SUCCINATE ER 100 MG PO TB24: 100.0000 mg | ORAL_TABLET | Freq: Every day | ORAL | 2 refills | Status: DC

## 2017-05-23 NOTE — Progress Notes (Signed)
[name] was seen today in the movement disorders clinic for neurologic consultation at the request of [name].  Her PCP is [name] Arabian, MD.  The consultation is for the evaluation of PD.  This patient is accompanied in the office by her spouse who supplements the history.  I have reviewed prior records made available to me.  Extensive record review was done on 04/15/2016 and took 45 minutes.  The patient has seen multiple physicians at [facility] neurology, including Dr. [name] (only for EMG), Dr. [name], Dr. [name] and Dr. [name].  The patient was first seen by Dr. [name] in 2014 and subsequently had an EMG by Dr. [name].  The primary complaint at that point was knee pain and she was diagnosed with a painful peripheral neuropathy.  In June, 2015 she followed up with Dr. [name] and was complaining about dizziness and gait instability.  Dr. [name] felt that perhaps this was due to cervical degenerative changes.  When she came back for follow-up, Dr. [name] had left the practice and Dr. [name] had resumed her care.  Dr. [name] felt that her symptoms were consistent with parkinsonism and she was started on levodopa in December, 2015.  She was started on half a tablet 3 times per day.  Initial follow-up notes more month later indicate that the patient thought that the medication was helpful.  In February, 2016 she was started on Azilect but it was expensive and she thought that it caused swelling and so pt d/c it.  However, once she d/c she thought that she got worse so she restarted it.  In July, 2016 it was attempted to discontinue the levodopa and try her on Neupro patch.  The patient felt that she did worse with this and she stopped patch.  The patient saw Dr. [name] in May, 2017 for a second opinion.  Dr. [name] stated "she does not give any sinister atypical history for atypical parkinsonism but does not have a very strong history or telltale exam for idiopathic Parkinson's disease either.  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   Specific Symptoms:  Tremor: No. Family hx of similar:  No. Voice: patients husband thinks that voice is same; pt thinks that it is deeper Sleep: patient states that she doesn't sleep well (but states that it is because of husbands electric blanket makes her hot)  Vivid Dreams:  Yes.    Acting out dreams:  Yes.  , screams out x several years but no falling out of the bed Wet Pillows: Yes.   Postural symptoms:  Yes.   x 3-4 years and thinks that perhaps due to back surgeries.  Used walker x several years  Falls? Only one time in march 2017 - trying to put something on bed and fell; few other times bent down and "eased down" to floor (for example at file cabinet) Bradykinesia symptoms: slow movements, difficulty getting out of a chair and small steps Loss of smell:  No. Loss of taste:  No. Urinary Incontinence:  Yes.   x 1 year (cannot get to bathroom fast enough) Difficulty Swallowing:  :not really" Handwriting, micrographia: Yes.   Trouble with ADL's:  Yes.   but better than she could (trouble with socks)  Trouble buttoning clothing: No. Depression:  No. Memory changes:  No. Hallucinations:  No.  visual distortions:  No., but does have some floaters and sees Dr. [name] N/V:  No. Lightheaded:  No.  Syncope: No. Diplopia:  Yes.   x 3-4 months (horizontal, goes away if closes eye, comes and goes, doesn't know if time of day, doesn't know if better if after nap; sometimes gets told to open L eye wider but not sure if has ptosis) Dyskinesia:  No.  Neuroimaging has previously been performed.  It is available for my review today.  I reviewed the patient's MRI of the brain dated 11/28/2013.  There was mild atrophy and a few T2 hyperintensities.  05/11/16 update:  Pt presents for levodopa challenge test.   She has been off of levodopa since 4:30 pm.  She has also held her pramipexole, 0.5 mg q hs that she takes for RLS.  Her legs are burning.  Her legs feel restless.  She doesn't feel more stiff off of medication.  Her main issues off of drug were at night (RLS).    09/17/16 update:  Patient presents today.  She is on carbidopa/levodopa 25/100, 2 tablets 4 times per day.  She is also on pramipexole 0.5 mg at night.  She did go to physical therapy since our last visit.  Those notes were reviewed.  She had a modified barium swallow since our last visit that was normal.  Pt denies falls.  Pt denies lightheadedness, near syncope.  Husband states that she talks in her sleep.  One day she thought she had a hallucination and "something" ran in front of her but never recurred.  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  This patient is accompanied in the office by her her husband who supplements the history.  She is on carbidopa/levodopa 25/100, 2 po qid. She is also on pramipexole 0.5 mg at night, primarily for restless leg syndrome.  States that a few times a week, she gets up in the middle of the night and takes an extra.  The records that were made available to me were reviewed.  Pt denies falls.  Pt denies lightheadedness, near syncope.  No hallucinations.  Mood is "fair" until her husband "gets upset."  States that her husband was dx with mild dementia and she is having to take care of him.  admts she is not using her walker/cane faithfully.  No diplopia.    05/24/17 update: Patient seen in follow-up for parkinsonism.  She is accompanied by her husband who supplements the history.  She is on carbidopa/levodopa 25/100, 2 tablets 4 times per day.  She is also on pramipexole 0.5 mg at night.  She has a rare hallucination of something running across the floor but it doesn't happen daily.  No lightheadedness  or near syncope.  Her mood has been fair (stable).  She has had no falls since last visit.  She has been back to see Dr. Annamaria Boots since our last visit and I reviewed his records.  States that her PCP ordered her to start gabapentin 300 mg q day and she is waiting for it from the mail order pharmacy.  It is for feet paresthesias.  States that her husband had C.diff following diverticulitis and she wonders if she didn't have c.diff but she is better now.  She states that she occasionally will forget her cane places but "that's how I know I am getting better."  She also recently  got out of the bathtub by herself which is an improvement.  PREVIOUS MEDICATIONS: Sinemet, Mirapex and neupro  ALLERGIES:   Allergies  Allergen Reactions  . Lyrica [Pregabalin] Other (See Comments)    Felt loopy  . Zocor [Simvastatin] Other (See Comments)    Myalgias  . Relafen [Nabumetone] Rash    CURRENT MEDICATIONS:  Outpatient Encounter Medications as of 05/24/2017  Medication Sig  . acetaminophen (TYLENOL) 500 MG tablet Take 1,000 mg by mouth daily as needed (pain).  . carbidopa-levodopa (SINEMET IR) 25-100 MG tablet Take 2 tablets by mouth 4 (four) times daily.  . flecainide (TAMBOCOR) 50 MG tablet Take 1 tablet (50 mg total) by mouth 2 (two) times daily.  . furosemide (LASIX) 20 MG tablet Take 20 mg by mouth 2 (two) times daily.   . Magnesium 400 MG TABS Take 400 mg by mouth at bedtime.  Marland Kitchen MEGARED OMEGA-3 KRILL OIL 500 MG CAPS Take 500 mg by mouth daily.  . metolazone (ZAROXOLYN) 5 MG tablet Take 5 mg by mouth daily.  . metoprolol succinate (TOPROL-XL) 100 MG 24 hr tablet Take 1 tablet (100 mg total) by mouth daily.  . potassium chloride SA (K-DUR,KLOR-CON) 20 MEQ tablet Take 40 mEq by mouth 2 (two) times daily.   . pramipexole (MIRAPEX) 0.5 MG tablet Take 0.5 mg by mouth at bedtime. For restless legs  . pravastatin (PRAVACHOL) 40 MG tablet Take 40 mg by mouth at bedtime.   . TRAVATAN Z 0.004 % SOLN ophthalmic  solution   . warfarin (COUMADIN) 5 MG tablet Take as directed by coumadin clinic  . fexofenadine (ALLEGRA) 180 MG tablet Take 180 mg by mouth as needed. For allergies  . [DISCONTINUED] doxycycline (VIBRA-TABS) 100 MG tablet 2 today then one daily   No facility-administered encounter medications on file as of 05/24/2017.     PAST MEDICAL HISTORY:   Past Medical History:  Diagnosis Date  . Allergic rhinitis   . Arthritis    "all over"  . CHF (congestive heart failure) ([place])   . Chronic back pain   . DJD (degenerative joint disease)   . GERD (gastroesophageal reflux disease)   . Hyperlipidemia   . Hypertension   . Lumbar stenosis   . OSA (obstructive sleep apnea)    "couldn't handle CPAP; use mouth guard some; not all the time" (01/06/2014)  . PAF (paroxysmal atrial fibrillation) ([place])   . Scoliosis    with radiculopathy L2-S1 with prior surgery  . Small bowel obstruction ([facility])    versus ileus after last bck surgery  . Spondylosis     PAST SURGICAL HISTORY:   Past Surgical History:  Procedure Laterality Date  . BACK SURGERY    . CATARACT EXTRACTION W/ INTRAOCULAR LENS  IMPLANT, BILATERAL  2011  . COLONOSCOPY  12/09/2004  . LIPOMA EXCISION  1980's   "fatty tumors"  . [place] SURGERY  02/2009   "ruptured disc"  . NASAL SEPTUM SURGERY  80's  . POSTERIOR LUMBAR FUSION  06/2010; 10/2011   "placed screws, rods, spacers both times"  . REPAIR DURAL / CSF LEAK  02/2009  . TOTAL ABDOMINAL HYSTERECTOMY  02/1993    SOCIAL HISTORY:   Social History   Socioeconomic History  . Marital status: Married    Spouse name: [name]  . Number of children: 2  . Years of education: 12th   . Highest education level: Not on file  Social Needs  . Financial resource strain: Not on file  . Food insecurity -  worry: Not on file  . Food insecurity - inability: Not on file  . Transportation needs - medical: Not on file  . Transportation needs - non-medical: Not on file  Occupational History  .  Occupation: retired    Fish farm manager: RETIRED    Comment: clerical office work  Tobacco Use  . Smoking status: Never Smoker  . Smokeless tobacco: Never Used  Substance and Sexual Activity  . Alcohol use: No    Alcohol/week: 0.0 oz    Comment: 02/01/2014 " glasseof wine once in a blue moon; < once/year"  . Drug use: No  . Sexual activity: No    Comment: hysterectomy  Other Topics Concern  . Not on file  Social History Narrative   Patient lives at home with husband [name].   Patient is retired.   Patient has a high school education.    Patient has 2 children     FAMILY HISTORY:   Family Status  Relation Name Status  . Mother  Deceased at age 46       natural causes  . Father  Deceased at age 75       MI  . MGM  Deceased  . MGF  Deceased  . PGM  Deceased  . PGF  Deceased  . Sister  Deceased  . Sister  (Not Specified)  . Sister  (Not Specified)  . Daughter 2 Alive    ROS:  A complete 10 system review of systems was obtained and was unremarkable apart from what is mentioned above.  PHYSICAL EXAMINATION:    VITALS:   Vitals:   05/24/17 1301  BP: 120/80  Pulse: 68  SpO2: 97%  Weight: 191 lb (86.6 kg)  Height: 5\' 5"  (1.651 m)    GEN:  The patient appears stated age and is in NAD. HEENT:  Normocephalic, atraumatic.  The mucous membranes are moist. The superficial temporal arteries are without ropiness or tenderness. CV:  RRR Lungs:  CTAB Neck/HEME:  There are no carotid bruits bilaterally.  Neurological examination:  Orientation: The patient is alert and oriented x3.  Cranial nerves: There is good facial symmetry.  Extraocular muscles are intact but there is mild downgaze paresis (same as prior).  She does have a few square wave jerks.  The visual fields are full to confrontational testing. The speech is fluent and clear but she has a slight pseudobulbar quality to the speech.  She does not have significant difficulty with guttural sounds.  Soft palate rises symmetrically  and there is no tongue deviation. Hearing is intact to conversational tone. Sensation: Sensation is intact to light touch throughout Motor: Strength is 5/5 in the bilateral upper and lower extremities.   Shoulder shrug is equal and symmetric.  There is no pronator drift.  No fasciculations noted.  Movement examination:  Tone: she has normal tone today  (has not yet taken noon dose of medication and it is 1pm.  Last took at 8am) Abnormal movements: no dyskinesia today Coordination:  There is decremation with RAM's, with finger taps and toe taps on the L Gait and Station: The patient has significant difficulty arising out of a deep-seated chair without the use of the hands.  her stride length is decreased.  She has mild trouble in the turns.  LABS:   Labs received from primary care physician from 07/16/2016.  Sodium was 142, potassium 3.6, chloride 103, CO2 30, BUN 19, creatinine 1.05.  AST 14, ALT 4, glucose 87.  ASSESSMENT/PLAN:  1.  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  She has some square wave jerks, diplopia (within the first 3 years of onset), and mild downgaze paresis.  We talked about nature, etiology and pathophysiology.   -Levodopa challenge test was done and levodopa was only of mild benefit.  However, the patient does think that it is helpful and it did help the rigidity in the left arm.  In addition, it helped her restless leg as does the pramipexole.  Will need to closely watch for hallucinations.    - Continue carbidopa/levodopa 25/100, 2 tablets 4 times per day.  She will continue her pramipexole, 0.5 mg at night that she takes for restless leg.  -asked her about PT.  She doesn't want to do that right now.  Talked to her about getting back to home exercise.  -told patient she needed to use walker at all times  -Modified  barium swallow on 05/21/2016 was normal.  -talked about need for future planning, especially given that husband recently dx with dementia.  They haven't done anything about this since last visit.  Advanced planning discussed again with patient, husband.  Had our social worker discuss with them as well   2.  feet parasthesias  -she is starting gabapentin 300 mg q hs.  Given yesterday by PCP and awaiting RX from mail order pharmacy  3.  Follow up is anticipated in the next few months, sooner should new neurologic issues arise.  Much greater than 50% of this visit was spent in counseling and coordinating care.  Total face to face time:  25 min     Cc:  [name] Arabian, MD

## 2017-05-24 ENCOUNTER — Encounter: Payer: Self-pay | Admitting: Neurology

## 2017-05-24 ENCOUNTER — Ambulatory Visit: Payer: Medicare Other | Admitting: Neurology

## 2017-05-24 VITALS — BP 120/80 | HR 68 | Ht 65.0 in | Wt 191.0 lb

## 2017-05-24 DIAGNOSIS — G20 Parkinson's disease: Secondary | ICD-10-CM | POA: Diagnosis not present

## 2017-05-24 DIAGNOSIS — R202 Paresthesia of skin: Secondary | ICD-10-CM

## 2017-05-25 ENCOUNTER — Telehealth: Payer: Self-pay | Admitting: *Deleted

## 2017-05-25 ENCOUNTER — Encounter: Payer: Self-pay | Admitting: Psychology

## 2017-05-25 DIAGNOSIS — Z8619 Personal history of other infectious and parasitic diseases: Secondary | ICD-10-CM

## 2017-05-25 NOTE — Telephone Encounter (Signed)
Per verbal order from Dr Baxter Flattery, stool kit given to patient and future cdiff pcr order placed in case of watery stools. Landis Gandy, RN

## 2017-05-25 NOTE — ACP (Advance Care Planning) (Signed)
I met with the patient and her husband mother in the clinic today.  We talked about advanced care planning and the patient and her husband both were willing to complete their advanced directives while they were in the office.  They both completed the advance directives, the forms were notarized, and they were provided copies to get to their primary care providers and their family members.  A copy of the patient's advanced directives will be uploaded into the media tab. 

## 2017-05-25 NOTE — Progress Notes (Signed)
I met with the patient and her husband mother in the clinic today.  We talked about advanced care planning and the patient and her husband both were willing to complete their advanced directives while they were in the office.  They both completed the advance directives, the forms were notarized, and they were provided copies to get to their primary care providers and their family members.  A copy of the patient's advanced directives will be uploaded into the media tab. 

## 2017-06-02 DIAGNOSIS — I129 Hypertensive chronic kidney disease with stage 1 through stage 4 chronic kidney disease, or unspecified chronic kidney disease: Secondary | ICD-10-CM | POA: Diagnosis not present

## 2017-06-03 DIAGNOSIS — H409 Unspecified glaucoma: Secondary | ICD-10-CM | POA: Diagnosis not present

## 2017-06-03 DIAGNOSIS — N183 Chronic kidney disease, stage 3 (moderate): Secondary | ICD-10-CM | POA: Diagnosis not present

## 2017-06-03 DIAGNOSIS — I4891 Unspecified atrial fibrillation: Secondary | ICD-10-CM | POA: Diagnosis not present

## 2017-06-03 DIAGNOSIS — G20 Parkinson's disease: Secondary | ICD-10-CM | POA: Diagnosis not present

## 2017-06-10 DIAGNOSIS — H532 Diplopia: Secondary | ICD-10-CM | POA: Diagnosis not present

## 2017-06-10 DIAGNOSIS — H04123 Dry eye syndrome of bilateral lacrimal glands: Secondary | ICD-10-CM | POA: Diagnosis not present

## 2017-06-10 DIAGNOSIS — H401131 Primary open-angle glaucoma, bilateral, mild stage: Secondary | ICD-10-CM | POA: Diagnosis not present

## 2017-06-10 DIAGNOSIS — H43811 Vitreous degeneration, right eye: Secondary | ICD-10-CM | POA: Diagnosis not present

## 2017-06-16 DIAGNOSIS — R202 Paresthesia of skin: Secondary | ICD-10-CM | POA: Diagnosis not present

## 2017-06-16 DIAGNOSIS — R3915 Urgency of urination: Secondary | ICD-10-CM | POA: Diagnosis not present

## 2017-06-16 DIAGNOSIS — R609 Edema, unspecified: Secondary | ICD-10-CM | POA: Diagnosis not present

## 2017-06-16 DIAGNOSIS — I129 Hypertensive chronic kidney disease with stage 1 through stage 4 chronic kidney disease, or unspecified chronic kidney disease: Secondary | ICD-10-CM | POA: Diagnosis not present

## 2017-06-17 DIAGNOSIS — R32 Unspecified urinary incontinence: Secondary | ICD-10-CM | POA: Diagnosis not present

## 2017-06-21 ENCOUNTER — Ambulatory Visit (INDEPENDENT_AMBULATORY_CARE_PROVIDER_SITE_OTHER): Payer: Medicare Other

## 2017-06-21 DIAGNOSIS — I4891 Unspecified atrial fibrillation: Secondary | ICD-10-CM | POA: Diagnosis not present

## 2017-06-21 DIAGNOSIS — Z5181 Encounter for therapeutic drug level monitoring: Secondary | ICD-10-CM | POA: Diagnosis not present

## 2017-06-21 LAB — POCT INR: INR: 2.5

## 2017-06-21 NOTE — Patient Instructions (Signed)
Description   Continue on same dosage 1/2 tablet daily.  Recheck INR in 6 weeks. Call if placed on any new medications  [phone]      

## 2017-07-04 DIAGNOSIS — I4891 Unspecified atrial fibrillation: Secondary | ICD-10-CM | POA: Diagnosis not present

## 2017-07-04 DIAGNOSIS — N183 Chronic kidney disease, stage 3 (moderate): Secondary | ICD-10-CM | POA: Diagnosis not present

## 2017-07-04 DIAGNOSIS — H409 Unspecified glaucoma: Secondary | ICD-10-CM | POA: Diagnosis not present

## 2017-07-07 ENCOUNTER — Other Ambulatory Visit: Payer: Self-pay | Admitting: *Deleted

## 2017-07-07 MED ORDER — WARFARIN SODIUM 5 MG PO TABS: ORAL_TABLET | ORAL | 0 refills | Status: DC

## 2017-07-15 DIAGNOSIS — H532 Diplopia: Secondary | ICD-10-CM | POA: Diagnosis not present

## 2017-07-15 DIAGNOSIS — H00011 Hordeolum externum right upper eyelid: Secondary | ICD-10-CM | POA: Diagnosis not present

## 2017-07-15 DIAGNOSIS — H43811 Vitreous degeneration, right eye: Secondary | ICD-10-CM | POA: Diagnosis not present

## 2017-07-15 DIAGNOSIS — H401131 Primary open-angle glaucoma, bilateral, mild stage: Secondary | ICD-10-CM | POA: Diagnosis not present

## 2017-07-18 DIAGNOSIS — K219 Gastro-esophageal reflux disease without esophagitis: Secondary | ICD-10-CM | POA: Diagnosis not present

## 2017-07-18 DIAGNOSIS — E7800 Pure hypercholesterolemia, unspecified: Secondary | ICD-10-CM | POA: Diagnosis not present

## 2017-07-18 DIAGNOSIS — N183 Chronic kidney disease, stage 3 (moderate): Secondary | ICD-10-CM | POA: Diagnosis not present

## 2017-07-18 DIAGNOSIS — I129 Hypertensive chronic kidney disease with stage 1 through stage 4 chronic kidney disease, or unspecified chronic kidney disease: Secondary | ICD-10-CM | POA: Diagnosis not present

## 2017-07-22 DIAGNOSIS — G20 Parkinson's disease: Secondary | ICD-10-CM | POA: Diagnosis not present

## 2017-07-22 DIAGNOSIS — I4891 Unspecified atrial fibrillation: Secondary | ICD-10-CM | POA: Diagnosis not present

## 2017-07-22 DIAGNOSIS — N183 Chronic kidney disease, stage 3 (moderate): Secondary | ICD-10-CM | POA: Diagnosis not present

## 2017-07-22 DIAGNOSIS — H409 Unspecified glaucoma: Secondary | ICD-10-CM | POA: Diagnosis not present

## 2017-08-02 ENCOUNTER — Ambulatory Visit (INDEPENDENT_AMBULATORY_CARE_PROVIDER_SITE_OTHER): Payer: Medicare Other

## 2017-08-02 DIAGNOSIS — Z5181 Encounter for therapeutic drug level monitoring: Secondary | ICD-10-CM | POA: Diagnosis not present

## 2017-08-02 DIAGNOSIS — I4891 Unspecified atrial fibrillation: Secondary | ICD-10-CM

## 2017-08-02 LAB — POCT INR: INR: 2.4

## 2017-08-02 NOTE — Patient Instructions (Signed)
Description   Continue on same dosage 1/2 tablet daily.  Recheck INR in 6 weeks. Call if placed on any new medications  [phone]      

## 2017-08-04 ENCOUNTER — Other Ambulatory Visit: Payer: Self-pay | Admitting: Neurology

## 2017-08-04 MED ORDER — CARBIDOPA-LEVODOPA 25-100 MG PO TABS: 2.0000 | ORAL_TABLET | Freq: Four times a day (QID) | ORAL | 1 refills | Status: DC

## 2017-08-05 ENCOUNTER — Other Ambulatory Visit: Payer: Self-pay | Admitting: *Deleted

## 2017-08-05 DIAGNOSIS — Z1231 Encounter for screening mammogram for malignant neoplasm of breast: Secondary | ICD-10-CM | POA: Diagnosis not present

## 2017-08-05 MED ORDER — FLECAINIDE ACETATE 50 MG PO TABS: 50.0000 mg | ORAL_TABLET | Freq: Two times a day (BID) | ORAL | 1 refills | Status: DC

## 2017-08-31 DIAGNOSIS — G20 Parkinson's disease: Secondary | ICD-10-CM | POA: Diagnosis not present

## 2017-08-31 DIAGNOSIS — N183 Chronic kidney disease, stage 3 (moderate): Secondary | ICD-10-CM | POA: Diagnosis not present

## 2017-08-31 DIAGNOSIS — H409 Unspecified glaucoma: Secondary | ICD-10-CM | POA: Diagnosis not present

## 2017-08-31 DIAGNOSIS — I4891 Unspecified atrial fibrillation: Secondary | ICD-10-CM | POA: Diagnosis not present

## 2017-09-05 NOTE — Progress Notes (Signed)
[name] was seen today in the movement disorders clinic for neurologic consultation at the request of [name].  Her PCP is [name], MD.  The consultation is for the evaluation of PD.  This patient is accompanied in the office by her spouse who supplements the history.  I have reviewed prior records made available to me.  Extensive record review was done on 04/15/2016 and took 45 minutes.  The patient has seen multiple physicians at [facility] neurology, including Dr. [name] (only for EMG), Dr. [name], Dr. [name] and Dr. [name].  The patient was first seen by Dr. [name] in 2014 and subsequently had an EMG by Dr. [name].  The primary complaint at that point was knee pain and she was diagnosed with a painful peripheral neuropathy.  In June, 2015 she followed up with Dr. [name] and was complaining about dizziness and gait instability.  Dr. [name] felt that perhaps this was due to cervical degenerative changes.  When she came back for follow-up, Dr. [name] had left the practice and Dr. [name] had resumed her care.  Dr. [name] felt that her symptoms were consistent with parkinsonism and she was started on levodopa in December, 2015.  She was started on half a tablet 3 times per day.  Initial follow-up notes more month later indicate that the patient thought that the medication was helpful.  In February, 2016 she was started on Azilect but it was expensive and she thought that it caused swelling and so pt d/c it.  However, once she d/c she thought that she got worse so she restarted it.  In July, 2016 it was attempted to discontinue the levodopa and try her on Neupro patch.  The patient felt that she did worse with this and she stopped patch.  The patient saw Dr. [name] in May, 2017 for a second opinion.  Dr. [name] stated "she does not give any sinister atypical history for atypical parkinsonism but does not have a very strong history or telltale exam for idiopathic Parkinson's disease either.  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   Specific Symptoms:  Tremor: No. Family hx of similar:  No. Voice: patients husband thinks that voice is same; pt thinks that it is deeper Sleep: patient states that she doesn't sleep well (but states that it is because of husbands electric blanket makes her hot)  Vivid Dreams:  Yes.    Acting out dreams:  Yes.  , screams out x several years but no falling out of the bed Wet Pillows: Yes.   Postural symptoms:  Yes.   x 3-4 years and thinks that perhaps due to back surgeries.  Used walker x several years  Falls? Only one time in march 2017 - trying to put something on bed and fell; few other times bent down and "eased down" to floor (for example at file cabinet) Bradykinesia symptoms: slow movements, difficulty getting out of a chair and small steps Loss of smell:  No. Loss of taste:  No. Urinary Incontinence:  Yes.   x 1 year (cannot get to bathroom fast enough) Difficulty Swallowing:  :not really" Handwriting, micrographia: Yes.   Trouble with ADL's:  Yes.   but better than she could (trouble with socks)  Trouble buttoning clothing: No. Depression:  No. Memory changes:  No. Hallucinations:  No.  visual distortions:  No., but does have some floaters and sees Dr. [name] N/V:  No. Lightheaded:  No.  Syncope: No. Diplopia:  Yes.   x 3-4 months (horizontal, goes away if closes eye, comes and goes, doesn't know if time of day, doesn't know if better if after nap; sometimes gets told to open L eye wider but not sure if has ptosis) Dyskinesia:  No.  Neuroimaging has previously been performed.  It is available for my review today.  I reviewed the patient's MRI of the brain dated 11/28/2013.  There was mild atrophy and a few T2 hyperintensities.  05/11/16 update:  Pt presents for levodopa challenge test.   She has been off of levodopa since 4:30 pm.  She has also held her pramipexole, 0.5 mg q hs that she takes for RLS.  Her legs are burning.  Her legs feel restless.  She doesn't feel more stiff off of medication.  Her main issues off of drug were at night (RLS).    09/17/16 update:  Patient presents today.  She is on carbidopa/levodopa 25/100, 2 tablets 4 times per day.  She is also on pramipexole 0.5 mg at night.  She did go to physical therapy since our last visit.  Those notes were reviewed.  She had a modified barium swallow since our last visit that was normal.  Pt denies falls.  Pt denies lightheadedness, near syncope.  Husband states that she talks in her sleep.  One day she thought she had a hallucination and "something" ran in front of her but never recurred.  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  This patient is accompanied in the office by her her husband who supplements the history.  She is on carbidopa/levodopa 25/100, 2 po qid. She is also on pramipexole 0.5 mg at night, primarily for restless leg syndrome.  States that a few times a week, she gets up in the middle of the night and takes an extra.  The records that were made available to me were reviewed.  Pt denies falls.  Pt denies lightheadedness, near syncope.  No hallucinations.  Mood is "fair" until her husband "gets upset."  States that her husband was dx with mild dementia and she is having to take care of him.  admts she is not using her walker/cane faithfully.  No diplopia.    05/24/17 update: Patient seen in follow-up for parkinsonism.  She is accompanied by her husband who supplements the history.  She is on carbidopa/levodopa 25/100, 2 tablets 4 times per day.  She is also on pramipexole 0.5 mg at night.  She has a rare hallucination of something running across the floor but it doesn't happen daily.  No lightheadedness  or near syncope.  Her mood has been fair (stable).  She has had no falls since last visit.  She has been back to see Dr. Annamaria Boots since our last visit and I reviewed his records.  States that her PCP ordered her to start gabapentin 300 mg q day and she is waiting for it from the mail order pharmacy.  It is for feet paresthesias.  States that her husband had C.diff following diverticulitis and she wonders if she didn't have c.diff but she is better now.  She states that she occasionally will forget her cane places but "that's how I know I am getting better."  She also recently  got out of the bathtub by herself which is an improvement.  09/06/17 update: The patient is seen today in follow-up.  She is accompanied by her husband who supplements the history.  The patient is on carbidopa/levodopa 25/100, 2 tablets 4 times per day (last taken at 8am and seen at 1pm.  Forgot her noon dosage)   and pramipexole 0.5 mg at night, which is primarily for restless leg.  She has had one "tumble" where she was leaning to pick up something and fell on the floor.  Didn't get hurt.  No lightheadedness or near syncope.  No hallucinations.  Mood is usually good.   Records have been reviewed since last visit.  Appears that she did call to her ID physician about diarrhea and was given a stool kit.  Her husband had c.diff and she thinks that she got it from him.  She got vancomycin.  She is planning on going to [place] in a few weeks.    PREVIOUS MEDICATIONS: Sinemet, Mirapex and neupro  ALLERGIES:   Allergies  Allergen Reactions  . Lyrica [Pregabalin] Other (See Comments)    Felt loopy  . Zocor [Simvastatin] Other (See Comments)    Myalgias  . Relafen [Nabumetone] Rash    CURRENT MEDICATIONS:  Outpatient Encounter Medications as of 09/06/2017  Medication Sig  . acetaminophen (TYLENOL) 500 MG tablet Take 1,000 mg by mouth daily as needed (pain).  . carbidopa-levodopa (SINEMET IR) 25-100 MG tablet Take 2 tablets by mouth 4 (four)  times daily.  . fexofenadine (ALLEGRA) 180 MG tablet Take 180 mg by mouth as needed. For allergies  . flecainide (TAMBOCOR) 50 MG tablet Take 1 tablet (50 mg total) by mouth 2 (two) times daily.  . furosemide (LASIX) 20 MG tablet Take 20 mg by mouth 2 (two) times daily.   Marland Kitchen lisinopril (PRINIVIL,ZESTRIL) 10 MG tablet Take 10 mg by mouth daily.  . Magnesium 400 MG TABS Take 400 mg by mouth at bedtime.  Marland Kitchen MEGARED OMEGA-3 KRILL OIL 500 MG CAPS Take 500 mg by mouth daily.  . metolazone (ZAROXOLYN) 5 MG tablet Take 5 mg by mouth daily.  . metoprolol succinate (TOPROL-XL) 100 MG 24 hr tablet Take 1 tablet (100 mg total) by mouth daily.  . potassium chloride SA (K-DUR,KLOR-CON) 20 MEQ tablet Take 40 mEq by mouth 2 (two) times daily.   . pramipexole (MIRAPEX) 0.5 MG tablet Take 0.5 mg by mouth at bedtime. For restless legs  . pravastatin (PRAVACHOL) 40 MG tablet Take 40 mg by mouth at bedtime.   . TRAVATAN Z 0.004 % SOLN ophthalmic solution   . warfarin (COUMADIN) 5 MG tablet Take as directed by coumadin clinic   No facility-administered encounter medications on file as of 09/06/2017.     PAST MEDICAL HISTORY:   Past Medical History:  Diagnosis Date  . Allergic rhinitis   . Arthritis    "all over"  . CHF (congestive heart failure) ([place])   . Chronic back pain   . DJD (degenerative joint disease)   . GERD (gastroesophageal reflux disease)   . Hyperlipidemia   . Hypertension   . Lumbar stenosis   . OSA (obstructive sleep apnea)    "couldn't handle CPAP; use mouth guard some; not all the time" (01/06/2014)  . PAF (paroxysmal atrial fibrillation) ([place])   . Scoliosis    with radiculopathy L2-S1 with prior surgery  . Small bowel obstruction (H[facility])    versus ileus after last bck surgery  . Spondylosis  PAST SURGICAL HISTORY:   Past Surgical History:  Procedure Laterality Date  . BACK SURGERY    . CATARACT EXTRACTION W/ INTRAOCULAR LENS  IMPLANT, BILATERAL  2011  . COLONOSCOPY  12/09/2004  .  LIPOMA EXCISION  1980's   "fatty tumors"  . [place] SURGERY  02/2009   "ruptured disc"  . NASAL SEPTUM SURGERY  80's  . POSTERIOR LUMBAR FUSION  06/2010; 10/2011   "placed screws, rods, spacers both times"  . REPAIR DURAL / CSF LEAK  02/2009  . TOTAL ABDOMINAL HYSTERECTOMY  02/1993    SOCIAL HISTORY:   Social History   Socioeconomic History  . Marital status: Married    Spouse name: [name]  . Number of children: 2  . Years of education: 12th   . Highest education level: Not on file  Occupational History  . Occupation: retired    Fish farm manager: RETIRED    Comment: clerical office work  Social Needs  . Financial resource strain: Not on file  . Food insecurity:    Worry: Not on file    Inability: Not on file  . Transportation needs:    Medical: Not on file    Non-medical: Not on file  Tobacco Use  . Smoking status: Never Smoker  . Smokeless tobacco: Never Used  Substance and Sexual Activity  . Alcohol use: No    Alcohol/week: 0.0 oz    Comment: 02/01/2014 " glasseof wine once in a blue moon; < once/year"  . Drug use: No  . Sexual activity: Never    Comment: hysterectomy  Lifestyle  . Physical activity:    Days per week: Not on file    Minutes per session: Not on file  . Stress: Not on file  Relationships  . Social connections:    Talks on phone: Not on file    Gets together: Not on file    Attends religious service: Not on file    Active member of club or organization: Not on file    Attends meetings of clubs or organizations: Not on file    Relationship status: Not on file  . Intimate partner violence:    Fear of current or ex partner: Not on file    Emotionally abused: Not on file    Physically abused: Not on file    Forced sexual activity: Not on file  Other Topics Concern  . Not on file  Social History Narrative   Patient lives at home with husband [name].   Patient is retired.   Patient has a high school education.    Patient has 2 children     FAMILY  HISTORY:   Family Status  Relation Name Status  . Mother  Deceased at age 68       natural causes  . Father  Deceased at age 14       MI  . MGM  Deceased  . MGF  Deceased  . PGM  Deceased  . PGF  Deceased  . Sister  Deceased  . Sister  (Not Specified)  . Sister  (Not Specified)  . Daughter 2 Alive    ROS:  A complete 10 system review of systems was obtained and was unremarkable apart from what is mentioned above.  PHYSICAL EXAMINATION:    VITALS:   Vitals:   09/06/17 1249  BP: (!) 150/72  Pulse: 67  SpO2: 99%  Weight: 175 lb (79.4 kg)  Height: '5\' 5"'$  (1.651 m)    GEN:  The patient  appears stated age and is in NAD. HEENT:  Normocephalic, atraumatic.  The mucous membranes are moist. The superficial temporal arteries are without ropiness or tenderness. CV:  RRR Lungs:  CTAB Neck/HEME:  There are no carotid bruits bilaterally.  Neurological examination:  Orientation: The patient is alert and oriented x3.  Cranial nerves: There is good facial symmetry.  Extraocular muscles are intact but there is mild downgaze paresis (same as prior).  She does have a few square wave jerks.  The visual fields are full to confrontational testing. The speech is fluent and clear but she has a slight pseudobulbar quality to the speech.  She does not have significant difficulty with guttural sounds.  Soft palate rises symmetrically and there is no tongue deviation. Hearing is intact to conversational tone. Sensation: Sensation is intact to light touch throughout Motor: Strength is 5/5 in the bilateral upper and lower extremities.   Shoulder shrug is equal and symmetric.  There is no pronator drift.  No fasciculations noted.  Movement examination:  Tone: has mod rigidity of the RUE and mild in the LUE (has not yet taken noon dose of medication and it is 1pm.  Last took at 8am.  This is same as last visit) Abnormal movements: no dyskinesia today Coordination:  There is decremation with RAM's, with  toe taps on the L Gait and Station: The patient uses her walker and walks very well with that.    LABS:   Labs received from primary care physician from 07/16/2016.  Sodium was 142, potassium 3.6, chloride 103, CO2 30, BUN 19, creatinine 1.05.  AST 14, ALT 4, glucose 87.  ASSESSMENT/PLAN:  1.  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  She has some square wave jerks, diplopia (within the first 3 years of onset), and mild downgaze paresis.  We talked about nature, etiology and pathophysiology.   -Levodopa challenge test was done and levodopa was only of mild benefit.  However, the patient does think that it is helpful and it did help the rigidity in the left arm.  In addition, it helped her restless leg as does the pramipexole.  Will need to closely watch for hallucinations.    - Continue carbidopa/levodopa 25/100, 2 tablets 4 times per day.  Reminded her about taking it on time.  -She will continue her pramipexole, 0.5 mg at night that she takes for restless leg.  -told patient she needed to use walker at all times  -told her she needs to add in safe, CV exercise  -Modified barium swallow on 05/21/2016 was normal.  -talked about need for future planning, especially given that husband dx with dementia.   Reiterated conversation re: advanced planning. States that they have gone to elder lawyer.   Pt does all driving for them.   2.  feet parasthesias  -she is starting gabapentin 300 mg q hs.  Given yesterday by PCP and awaiting RX from mail order pharmacy  3.  Follow up is anticipated in the next few months, sooner should new neurologic issues arise.  Much greater than 50% of this visit was spent in counseling and coordinating care.  Total face to face time:  25 min     Cc:  [name], MD

## 2017-09-06 ENCOUNTER — Other Ambulatory Visit: Payer: Self-pay

## 2017-09-06 ENCOUNTER — Encounter: Payer: Self-pay | Admitting: Neurology

## 2017-09-06 ENCOUNTER — Ambulatory Visit (INDEPENDENT_AMBULATORY_CARE_PROVIDER_SITE_OTHER): Payer: Medicare Other | Admitting: Neurology

## 2017-09-06 VITALS — BP 150/72 | HR 67 | Ht 65.0 in | Wt 175.0 lb

## 2017-09-06 DIAGNOSIS — G20 Parkinson's disease: Secondary | ICD-10-CM | POA: Diagnosis not present

## 2017-09-13 ENCOUNTER — Ambulatory Visit: Payer: Medicare Other | Admitting: *Deleted

## 2017-09-13 DIAGNOSIS — I4891 Unspecified atrial fibrillation: Secondary | ICD-10-CM | POA: Diagnosis not present

## 2017-09-13 DIAGNOSIS — Z5181 Encounter for therapeutic drug level monitoring: Secondary | ICD-10-CM | POA: Diagnosis not present

## 2017-09-13 LAB — POCT INR: INR: 2.2

## 2017-09-13 NOTE — Patient Instructions (Signed)
Description   Continue on same dosage 1/2 tablet daily.  Recheck INR in 6 weeks. Call if placed on any new medications  [phone]      

## 2017-10-24 ENCOUNTER — Ambulatory Visit: Payer: Medicare Other | Admitting: Adult Health

## 2017-10-24 ENCOUNTER — Encounter: Payer: Self-pay | Admitting: Adult Health

## 2017-10-24 DIAGNOSIS — J302 Other seasonal allergic rhinitis: Secondary | ICD-10-CM

## 2017-10-24 DIAGNOSIS — J209 Acute bronchitis, unspecified: Secondary | ICD-10-CM

## 2017-10-24 DIAGNOSIS — J3089 Other allergic rhinitis: Secondary | ICD-10-CM

## 2017-10-24 DIAGNOSIS — J208 Acute bronchitis due to other specified organisms: Secondary | ICD-10-CM

## 2017-10-24 MED ORDER — BENZONATATE 200 MG PO CAPS: 200.0000 mg | ORAL_CAPSULE | Freq: Three times a day (TID) | ORAL | 1 refills | Status: DC | PRN

## 2017-10-24 MED ORDER — DOXYCYCLINE HYCLATE 100 MG PO TABS: 100.0000 mg | ORAL_TABLET | Freq: Two times a day (BID) | ORAL | 0 refills | Status: DC

## 2017-10-24 NOTE — Assessment & Plan Note (Signed)
URI/Bronchitis , suspect viral   Plan  Patient Instructions  Mucinex Twice daily  As needed  Cough/congestion .  Delsym 2 tsp Twice daily  As needed  Cough .  Tessalon Three times a day  As needed  Cough .  Restart Allegra daily As needed  Drainage  Saline nasal rinses As needed   Stop Sudafed.  Doxycycline for 7 days , to have on hold if symptoms worsen with discolored mucus.  Follow up with Dr. Annamaria Boots  As planned and As needed    Please contact office for sooner follow up if symptoms do not improve or worsen or seek emergency care

## 2017-10-24 NOTE — Assessment & Plan Note (Signed)
?   Flare  Restart Allegra .

## 2017-10-24 NOTE — Progress Notes (Signed)
@Patient  ID: [name], female    DOB: [birth year], 77 y.o.   MRN: [id number]  Chief Complaint  Patient presents with  . Acute Visit    Cough     Referring provider: [name], MD  HPI: 77 year old female never smoker followed for allergic rhinitis mild sleep apnea with oral appliance Has medical history significant for A. fib on Coumadin, Parkinson's disease  10/24/2017 Acute OV : Cough  Patient presents for an acute office visit.  Complains of 4 days of cough , hoarseness, sore throat , nasal congestion , thick clear to white mucus. No fever.  Has taken sudafed and mucinex.  Appetite is good. ,no n/v.  Recently traveled to [place] did well until came back .    Allergies  Allergen Reactions  . Lyrica [Pregabalin] Other (See Comments)    Felt loopy  . Zocor [Simvastatin] Other (See Comments)    Myalgias  . Relafen [Nabumetone] Rash    Immunization History  Administered Date(s) Administered  . DTaP 02/24/2005  . H1N1 03/27/2008  . Influenza Split 03/14/2009, 03/20/2010, 04/14/2011, 03/02/2012, 03/26/2013, 02/27/2015, 03/07/2016  . Influenza Whole 03/20/2010  . Influenza, High Dose Seasonal PF 03/01/2014, 02/27/2015, 03/30/2017  . Influenza-Unspecified 03/07/2014  . Pneumococcal Conjugate-13 04/17/2013, 07/11/2015  . Pneumococcal Polysaccharide-23 05/12/2009  . Tdap 02/24/2005, 05/21/2011  . Zoster 05/18/2010, 03/22/2017    Past Medical History:  Diagnosis Date  . Allergic rhinitis   . Arthritis    "all over"  . CHF (congestive heart failure) ([place])   . Chronic back pain   . DJD (degenerative joint disease)   . GERD (gastroesophageal reflux disease)   . Hyperlipidemia   . Hypertension   . Lumbar stenosis   . OSA (obstructive sleep apnea)    "couldn't handle CPAP; use mouth guard some; not all the time" (01/06/2014)  . PAF (paroxysmal atrial fibrillation) ([place])   . Scoliosis    with radiculopathy L2-S1 with prior surgery  . Small bowel  obstruction ([facility])    versus ileus after last bck surgery  . Spondylosis     Tobacco History: Social History   Tobacco Use  Smoking Status Never Smoker  Smokeless Tobacco Never Used   Counseling given: Not Answered   Outpatient Encounter Medications as of 10/24/2017  Medication Sig  . acetaminophen (TYLENOL) 500 MG tablet Take 1,000 mg by mouth daily as needed (pain).  . calcium carbonate (TUMS - DOSED IN MG ELEMENTAL CALCIUM) 500 MG chewable tablet Chew 1 tablet by mouth at bedtime.  . carbidopa-levodopa (SINEMET IR) 25-100 MG tablet Take 2 tablets by mouth 4 (four) times daily.  . fexofenadine (ALLEGRA) 180 MG tablet Take 180 mg by mouth as needed. For allergies  . flecainide (TAMBOCOR) 50 MG tablet Take 1 tablet (50 mg total) by mouth 2 (two) times daily.  . furosemide (LASIX) 20 MG tablet Take 40 mg by mouth daily.   Marland Kitchen gabapentin (NEURONTIN) 300 MG capsule Take 300 mg by mouth at bedtime.  . Magnesium 400 MG TABS Take 400 mg by mouth at bedtime.  Marland Kitchen MEGARED OMEGA-3 KRILL OIL 500 MG CAPS Take 500 mg by mouth daily.  . metolazone (ZAROXOLYN) 5 MG tablet Take 5 mg by mouth daily.  . metoprolol succinate (TOPROL-XL) 100 MG 24 hr tablet Take 1 tablet (100 mg total) by mouth daily.  . potassium chloride SA (K-DUR,KLOR-CON) 20 MEQ tablet Take 40 mEq by mouth 2 (two) times daily.   . pramipexole (MIRAPEX) 0.5 MG tablet Take 0.5 mg  by mouth at bedtime. For restless legs  . pravastatin (PRAVACHOL) 40 MG tablet Take 40 mg by mouth at bedtime.   . TRAVATAN Z 0.004 % SOLN ophthalmic solution   . warfarin (COUMADIN) 5 MG tablet Take as directed by coumadin clinic  . benzonatate (TESSALON) 200 MG capsule Take 1 capsule (200 mg total) by mouth 3 (three) times daily as needed for cough.  . doxycycline (VIBRA-TABS) 100 MG tablet Take 1 tablet (100 mg total) by mouth 2 (two) times daily.  Marland Kitchen lisinopril (PRINIVIL,ZESTRIL) 10 MG tablet Take 10 mg by mouth daily.   No facility-administered encounter  medications on file as of 10/24/2017.      Review of Systems  Constitutional:   No  weight loss, night sweats,  Fevers, chills, fatigue, or  lassitude.  HEENT:   No headaches,  Difficulty swallowing,  Tooth/dental problems, or  Sore throat,                No sneezing, itching, ear ache,  +nasal congestion, post nasal drip,   CV:  No chest pain,  Orthopnea, PND, swelling in lower extremities, anasarca, dizziness, palpitations, syncope.   GI  No heartburn, indigestion, abdominal pain, nausea, vomiting, diarrhea, change in bowel habits, loss of appetite, bloody stools.   Resp:  .  No chest wall deformity  Skin: no rash or lesions.  GU: no dysuria, change in color of urine, no urgency or frequency.  No flank pain, no hematuria   MS:  No joint pain or swelling.  No decreased range of motion.  No back pain.    Physical Exam  BP 116/68 (BP Location: Left Arm, Cuff Size: Normal)   Pulse 79   Temp 97.6 F (36.4 C) (Oral)   Ht 5\' 5"  (1.651 m)   Wt 192 lb (87.1 kg)   SpO2 98%   BMI 31.95 kg/m   GEN: A/Ox3; pleasant , NAD, eldelry , walker    HEENT:  [place]/AT,  EACs-clear, TMs-wnl, NOSE-clear, THROAT-clear, no lesions, no postnasal drip or exudate noted.   NECK:  Supple w/ fair ROM; no JVD; normal carotid impulses w/o bruits; no thyromegaly or nodules palpated; no lymphadenopathy.    RESP  Clear  P & A; w/o, wheezes/ rales/ or rhonchi. no accessory muscle use, no dullness to percussion  CARD:  RRR, no m/r/g, tr  peripheral edema, pulses intact, no cyanosis or clubbing.  GI:   Soft & nt; nml bowel sounds; no organomegaly or masses detected.   Musco: Warm bil, no deformities or joint swelling noted.   Neuro: alert, no focal deficits noted.    Skin: Warm, no lesions or rashes    Lab Results:   BNP No results found for: BNP  Imaging: No results found.   Assessment & Plan:   Acute bronchitis due to infection URI/Bronchitis , suspect viral   Plan  Patient  Instructions  Mucinex Twice daily  As needed  Cough/congestion .  Delsym 2 tsp Twice daily  As needed  Cough .  Tessalon Three times a day  As needed  Cough .  Restart Allegra daily As needed  Drainage  Saline nasal rinses As needed   Stop Sudafed.  Doxycycline for 7 days , to have on hold if symptoms worsen with discolored mucus.  Follow up with Dr. Annamaria Boots  As planned and As needed    Please contact office for sooner follow up if symptoms do not improve or worsen or seek emergency care  Seasonal and perennial allergic rhinitis ? Flare  Restart Allegra .      Rexene Edison, NP 10/24/2017

## 2017-10-24 NOTE — Patient Instructions (Signed)
Mucinex Twice daily  As needed  Cough/congestion .  Delsym 2 tsp Twice daily  As needed  Cough .  Tessalon Three times a day  As needed  Cough .  Restart Allegra daily As needed  Drainage  Saline nasal rinses As needed   Stop Sudafed.  Doxycycline for 7 days , to have on hold if symptoms worsen with discolored mucus.  Follow up with Dr. Annamaria Boots  As planned and As needed    Please contact office for sooner follow up if symptoms do not improve or worsen or seek emergency care

## 2017-10-26 ENCOUNTER — Ambulatory Visit (INDEPENDENT_AMBULATORY_CARE_PROVIDER_SITE_OTHER): Payer: Medicare Other | Admitting: *Deleted

## 2017-10-26 DIAGNOSIS — Z5181 Encounter for therapeutic drug level monitoring: Secondary | ICD-10-CM

## 2017-10-26 DIAGNOSIS — I4891 Unspecified atrial fibrillation: Secondary | ICD-10-CM | POA: Diagnosis not present

## 2017-10-26 LAB — POCT INR: INR: 2 (ref 2.0–3.0)

## 2017-10-26 NOTE — Patient Instructions (Addendum)
Description   Continue on same dosage 1/2 tablet daily.  Recheck INR in 6 weeks. Call if placed on any new medications  [phone] Pt instructed to call if starts Doxycycline

## 2017-11-01 NOTE — Progress Notes (Signed)
Patient ID: TKAI LARGE, female   DOB: 13-Oct-1940, 77 y.o.   MRN: [id number]    Cardiology Office Note   Date:  11/02/2017   ID:  [name], DOB 10-21-1940, MRN [id number]  PCP:  [name], MD  Cardiologist:  [name]    No chief complaint on file.     History of Present Illness: [name] is a 77 y.o. female who presents for follow up of HTN and PAF.  She is on coumadin followed by our office.  She has hx of a heart catheterization in Feb. 2002 that demonstrated normal coronary arteries and normal LV function.   Echo 2015 revealing normal LV function with an EF of 60-65%. Wall motion was normal; there were no regional wall motion abnormalities. The left atrium was normal in size.    Myovue 9/15 normal EF no ischemia  Flecainide added 04/03/14.  Is followed by Neuro for parkinson's.  Has been fitted for a brace on lt leg.  Uses a walker to ambulate.  No chest pain, no SOB.  No awareness of palpitations.  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 Allergic rhinitis   . Arthritis    "all over"  . CHF (congestive heart failure) ([place])   . Chronic back pain   . DJD (degenerative joint disease)   . GERD (gastroesophageal reflux disease)   . Hyperlipidemia   . Hypertension   . Lumbar stenosis   . OSA (obstructive sleep apnea)    "couldn't handle CPAP; use mouth guard some; not all the time" (01/06/2014)  . PAF (paroxysmal atrial fibrillation) ([place])   . Scoliosis    with radiculopathy L2-S1 with prior surgery  . Small bowel obstruction ([facility])    versus ileus after last bck surgery  . Spondylosis     Past Surgical History:  Procedure Laterality Date  . BACK SURGERY    . CATARACT EXTRACTION W/ INTRAOCULAR LENS  IMPLANT, BILATERAL  2011  . COLONOSCOPY   12/09/2004  . LIPOMA EXCISION  1980's   "fatty tumors"  . [place] SURGERY  02/2009   "ruptured disc"  . NASAL SEPTUM SURGERY  80's  . POSTERIOR LUMBAR FUSION  06/2010; 10/2011   "placed screws, rods, spacers both times"  . REPAIR DURAL / CSF LEAK  02/2009  . TOTAL ABDOMINAL HYSTERECTOMY  02/1993     Current Outpatient Medications  Medication Sig Dispense Refill  . acetaminophen (TYLENOL) 500 MG tablet Take 1,000 mg by mouth daily as needed (pain).    . benzonatate (TESSALON) 200 MG capsule Take 1 capsule (200 mg total) by mouth 3 (three) times daily as needed for cough. 30 capsule 1  . calcium carbonate (TUMS - DOSED IN MG ELEMENTAL CALCIUM) 500 MG chewable tablet Chew 1 tablet by mouth at bedtime.    . carbidopa-levodopa (SINEMET IR) 25-100 MG tablet Take 2 tablets by mouth 4 (four) times daily. 720 tablet 1  . fexofenadine (ALLEGRA) 180 MG tablet Take 180 mg by mouth as needed. For allergies    . flecainide (TAMBOCOR) 50 MG tablet Take 1 tablet (50 mg total) by mouth 2 (two) times daily. 180 tablet 1  . furosemide (LASIX) 20 MG tablet Take 40 mg by mouth daily.     Marland Kitchen  gabapentin (NEURONTIN) 300 MG capsule Take 300 mg by mouth at bedtime.    . Magnesium 400 MG TABS Take 400 mg by mouth at bedtime.    Marland Kitchen MEGARED OMEGA-3 KRILL OIL 500 MG CAPS Take 500 mg by mouth daily.    . metolazone (ZAROXOLYN) 5 MG tablet Take 5 mg by mouth daily.    . metoprolol succinate (TOPROL-XL) 100 MG 24 hr tablet Take 1 tablet (100 mg total) by mouth daily. 90 tablet 2  . potassium chloride SA (K-DUR,KLOR-CON) 20 MEQ tablet Take 40 mEq by mouth 2 (two) times daily.     . pramipexole (MIRAPEX) 0.5 MG tablet Take 0.5 mg by mouth at bedtime. For restless legs    . pravastatin (PRAVACHOL) 40 MG tablet Take 40 mg by mouth at bedtime.     . TRAVATAN Z 0.004 % SOLN ophthalmic solution   0  . warfarin (COUMADIN) 5 MG tablet Take as directed by coumadin clinic 90 tablet 0   No current facility-administered medications  for this visit.     Allergies:   Lyrica [pregabalin]; Zocor [simvastatin]; and Relafen [nabumetone]    Social History:  The patient  reports that she has never smoked. She has never used smokeless tobacco. She reports that she does not drink alcohol or use drugs.   Family History:  The patient's family history includes Allergies in her sister; Arthritis in her mother; Asthma in her sister; Heart attack in her father; Hypertension in her sister; Lung cancer in her sister.    ROS:  General:no colds or fevers, incr. of weight - not exercising Skin:no rashes or ulcers HEENT:no blurred vision, no congestion CV:see HPI PUL:see HPI GI:no diarrhea constipation or melena, no indigestion GU:no hematuria, no dysuria- for routine colonoscopy next week MS:no joint pain, no claudication- getting a brace for Lt leg, using a walker Neuro:no syncope, no lightheadedness Endo:no diabetes, no thyroid disease  Wt Readings from Last 3 Encounters:  11/02/17 194 lb 9.6 oz (88.3 kg)  10/24/17 192 lb (87.1 kg)  09/06/17 175 lb (79.4 kg)     PHYSICAL EXAM: VS:  BP 128/68   Pulse 75   Ht 5\' 5"  (1.651 m)   Wt 194 lb 9.6 oz (88.3 kg)   SpO2 97%   BMI 32.38 kg/m  , BMI Body mass index is 32.38 kg/m. Affect appropriate Chronically ill white female  HEENT: normal Neck supple with no adenopathy JVP normal no bruits no thyromegaly Lungs clear with no wheezing and good diaphragmatic motion Heart:  S1/S2 no murmur, no rub, gallop or click PMI normal Abdomen: benighn, BS positve, no tenderness, no AAA no bruit.  No HSM or HJR Distal pulses intact with no bruits No edema Neuro non-focal broad based gait limited facial expression  Skin warm and dry No muscular weakness      EKG:   03/24/15 SR with PAC poor R wave progression , no acute changes otherwise.  03/10/16 SR rate 67 nonspecific ST changes 11/02/17 SR rate 66 low voltage   Recent Labs: No results found for requested labs within last 8760  hours.    Lipid Panel    Component Value Date/Time   CHOL  12/22/2006 0550    183        ATP III CLASSIFICATION:  <200     mg/dL   Desirable  200-239  mg/dL   Borderline High  >=240    mg/dL   High   TRIG 174 (H) 12/22/2006 0550   HDL  47 12/22/2006 0550   CHOLHDL 3.9 12/22/2006 0550   VLDL 35 12/22/2006 0550   LDLCALC (H) 12/22/2006 0550    101        Total Cholesterol/HDL:CHD Risk Coronary Heart Disease Risk Table                     Men   Women  1/2 Average Risk   3.4   3.3       Other studies Reviewed: Additional studies/ records that were reviewed today include: previous notes and labs.   ASSESSMENT AND PLAN:  1.  PAF- maintaining SR.  On flecainide.  Anticoagulation on coumadin, with INR RX  Has been held In past with no lovenox bridge CHADVASC 2   2. HTN  Improved diuretic some white coat component   3.  HLD:  Labs with primary continue statin   4. Parkinson's  Seen by Dr Carles Collet 05/12/16 . She thinks she may have Lithium

## 2017-11-02 ENCOUNTER — Ambulatory Visit: Payer: Medicare Other | Admitting: Cardiovascular Disease

## 2017-11-02 ENCOUNTER — Encounter: Payer: Self-pay | Admitting: Cardiovascular Disease

## 2017-11-02 VITALS — BP 128/68 | HR 75 | Ht 65.0 in | Wt 194.6 lb

## 2017-11-02 DIAGNOSIS — I480 Paroxysmal atrial fibrillation: Secondary | ICD-10-CM

## 2017-11-02 DIAGNOSIS — I10 Essential (primary) hypertension: Secondary | ICD-10-CM | POA: Diagnosis not present

## 2017-11-02 DIAGNOSIS — E782 Mixed hyperlipidemia: Secondary | ICD-10-CM

## 2017-11-02 NOTE — Patient Instructions (Addendum)

## 2017-11-03 DIAGNOSIS — H409 Unspecified glaucoma: Secondary | ICD-10-CM | POA: Diagnosis not present

## 2017-11-03 DIAGNOSIS — I4891 Unspecified atrial fibrillation: Secondary | ICD-10-CM | POA: Diagnosis not present

## 2017-11-03 DIAGNOSIS — N183 Chronic kidney disease, stage 3 (moderate): Secondary | ICD-10-CM | POA: Diagnosis not present

## 2017-11-03 DIAGNOSIS — G20 Parkinson's disease: Secondary | ICD-10-CM | POA: Diagnosis not present

## 2017-11-21 DIAGNOSIS — H532 Diplopia: Secondary | ICD-10-CM | POA: Diagnosis not present

## 2017-11-21 DIAGNOSIS — H00011 Hordeolum externum right upper eyelid: Secondary | ICD-10-CM | POA: Diagnosis not present

## 2017-11-21 DIAGNOSIS — H401131 Primary open-angle glaucoma, bilateral, mild stage: Secondary | ICD-10-CM | POA: Diagnosis not present

## 2017-11-21 DIAGNOSIS — H43811 Vitreous degeneration, right eye: Secondary | ICD-10-CM | POA: Diagnosis not present

## 2017-11-23 DIAGNOSIS — R1084 Generalized abdominal pain: Secondary | ICD-10-CM | POA: Diagnosis not present

## 2017-11-23 DIAGNOSIS — R140 Abdominal distension (gaseous): Secondary | ICD-10-CM | POA: Diagnosis not present

## 2017-11-25 ENCOUNTER — Other Ambulatory Visit: Payer: Self-pay | Admitting: Family Medicine

## 2017-11-25 DIAGNOSIS — R109 Unspecified abdominal pain: Secondary | ICD-10-CM

## 2017-12-06 DIAGNOSIS — D229 Melanocytic nevi, unspecified: Secondary | ICD-10-CM | POA: Diagnosis not present

## 2017-12-06 DIAGNOSIS — D485 Neoplasm of uncertain behavior of skin: Secondary | ICD-10-CM | POA: Diagnosis not present

## 2017-12-06 DIAGNOSIS — L918 Other hypertrophic disorders of the skin: Secondary | ICD-10-CM | POA: Diagnosis not present

## 2017-12-07 ENCOUNTER — Ambulatory Visit: Payer: Medicare Other | Admitting: *Deleted

## 2017-12-07 DIAGNOSIS — Z5181 Encounter for therapeutic drug level monitoring: Secondary | ICD-10-CM

## 2017-12-07 DIAGNOSIS — I4891 Unspecified atrial fibrillation: Secondary | ICD-10-CM

## 2017-12-07 LAB — POCT INR: INR: 2.9 (ref 2.0–3.0)

## 2017-12-07 NOTE — Patient Instructions (Signed)
Description   Continue on same dosage 1/2 tablet daily.  Recheck INR in 6 weeks. Call if placed on any new medications  [phone]

## 2017-12-09 ENCOUNTER — Ambulatory Visit
Admission: RE | Admit: 2017-12-09 | Discharge: 2017-12-09 | Disposition: A | Discharge status: Home / Self Care | Payer: Medicare Other | Source: Ambulatory Visit | Attending: Family Medicine | Admitting: Family Medicine

## 2017-12-09 DIAGNOSIS — R109 Unspecified abdominal pain: Secondary | ICD-10-CM

## 2017-12-09 DIAGNOSIS — K824 Cholesterolosis of gallbladder: Secondary | ICD-10-CM | POA: Diagnosis not present

## 2017-12-13 ENCOUNTER — Other Ambulatory Visit: Payer: Self-pay | Admitting: Family Medicine

## 2017-12-13 DIAGNOSIS — K8689 Other specified diseases of pancreas: Secondary | ICD-10-CM

## 2017-12-17 ENCOUNTER — Ambulatory Visit
Admission: RE | Admit: 2017-12-17 | Discharge: 2017-12-17 | Disposition: A | Discharge status: Home / Self Care | Payer: Medicare Other | Source: Ambulatory Visit | Attending: Family Medicine | Admitting: Family Medicine

## 2017-12-17 DIAGNOSIS — K8689 Other specified diseases of pancreas: Secondary | ICD-10-CM

## 2017-12-17 DIAGNOSIS — K760 Fatty (change of) liver, not elsewhere classified: Secondary | ICD-10-CM | POA: Diagnosis not present

## 2017-12-17 MED ORDER — GADOBENATE DIMEGLUMINE 529 MG/ML IV SOLN
17.0000 mL | Freq: Once | INTRAVENOUS | Status: AC | PRN
  Administered 2017-12-17: 15 mL via INTRAVENOUS

## 2017-12-28 DIAGNOSIS — I4891 Unspecified atrial fibrillation: Secondary | ICD-10-CM | POA: Diagnosis not present

## 2017-12-28 DIAGNOSIS — G20 Parkinson's disease: Secondary | ICD-10-CM | POA: Diagnosis not present

## 2017-12-28 DIAGNOSIS — N183 Chronic kidney disease, stage 3 (moderate): Secondary | ICD-10-CM | POA: Diagnosis not present

## 2017-12-28 DIAGNOSIS — H409 Unspecified glaucoma: Secondary | ICD-10-CM | POA: Diagnosis not present

## 2018-01-11 ENCOUNTER — Other Ambulatory Visit: Payer: Self-pay | Admitting: Cardiovascular Disease

## 2018-01-19 ENCOUNTER — Ambulatory Visit: Payer: Medicare Other | Admitting: Pharmacist

## 2018-01-19 DIAGNOSIS — I4891 Unspecified atrial fibrillation: Secondary | ICD-10-CM | POA: Diagnosis not present

## 2018-01-19 DIAGNOSIS — Z5181 Encounter for therapeutic drug level monitoring: Secondary | ICD-10-CM | POA: Diagnosis not present

## 2018-01-19 LAB — POCT INR: INR: 2.3 (ref 2.0–3.0)

## 2018-01-19 NOTE — Patient Instructions (Signed)
Continue on same dosage 1/2 tablet daily.  Recheck INR in 6 weeks. Call if placed on any new medications  [phone]

## 2018-01-27 DIAGNOSIS — I129 Hypertensive chronic kidney disease with stage 1 through stage 4 chronic kidney disease, or unspecified chronic kidney disease: Secondary | ICD-10-CM | POA: Diagnosis not present

## 2018-01-27 DIAGNOSIS — Z0000 Encounter for general adult medical examination without abnormal findings: Secondary | ICD-10-CM | POA: Diagnosis not present

## 2018-01-27 DIAGNOSIS — N183 Chronic kidney disease, stage 3 (moderate): Secondary | ICD-10-CM | POA: Diagnosis not present

## 2018-01-27 DIAGNOSIS — R140 Abdominal distension (gaseous): Secondary | ICD-10-CM | POA: Diagnosis not present

## 2018-01-27 DIAGNOSIS — E7800 Pure hypercholesterolemia, unspecified: Secondary | ICD-10-CM | POA: Diagnosis not present

## 2018-01-30 ENCOUNTER — Other Ambulatory Visit: Payer: Self-pay | Admitting: Cardiovascular Disease

## 2018-02-01 ENCOUNTER — Other Ambulatory Visit: Payer: Self-pay | Admitting: Cardiovascular Disease

## 2018-02-02 ENCOUNTER — Other Ambulatory Visit: Payer: Self-pay | Admitting: Neurology

## 2018-02-02 MED ORDER — CARBIDOPA-LEVODOPA 25-100 MG PO TABS: 2.0000 | ORAL_TABLET | Freq: Four times a day (QID) | ORAL | 1 refills | Status: DC

## 2018-02-02 NOTE — Telephone Encounter (Signed)
metoprolol succinate (TOPROL-XL) 100 MG 24 hr tablet  Medication  Date: 01/30/2018 Department: [address] Office Ordering/Authorizing: Josue Hector, MD  Order Providers   Prescribing Provider Encounter Provider  Josue Hector, MD Josue Hector, MD  Outpatient Medication Detail    Disp Refills Start End   metoprolol succinate (TOPROL-XL) 100 MG 24 hr tablet 90 tablet 2 01/30/2018    Sig: TAKE 1 TABLET BY MOUTH DAILY   Sent to pharmacy as: metoprolol succinate (TOPROL-XL) 100 MG 24 hr tablet   E-Prescribing Status: Receipt confirmed by pharmacy (01/30/2018 2:48 PM EDT)   Pharmacy   [place], [place]

## 2018-02-08 NOTE — Progress Notes (Signed)
[name] was seen today in the movement disorders clinic for neurologic consultation at the request of [name].  Her PCP is [name] Arabian, MD.  The consultation is for the evaluation of PD.  This patient is accompanied in the office by her spouse who supplements the history.  I have reviewed prior records made available to me.  Extensive record review was done on 04/15/2016 and took 45 minutes.  The patient has seen multiple physicians at [facility] neurology, including Dr. Jannifer [name] (only for EMG), Dr. Janann [name], Dr. Jaynee [name] and Dr. Rexene [name].  The patient was first seen by Dr. Janann [name] in 2014 and subsequently had an EMG by Dr. Jannifer [name].  The primary complaint at that point was knee pain and she was diagnosed with a painful peripheral neuropathy.  In June, 2015 she followed up with Dr. Janann [name] and was complaining about dizziness and gait instability.  Dr. Janann [name] felt that perhaps this was due to cervical degenerative changes.  When she came back for follow-up, Dr. Janann [name] had left the practice and Dr. Jaynee [name] had resumed her care.  Dr. Jaynee [name] felt that her symptoms were consistent with parkinsonism and she was started on levodopa in December, 2015.  She was started on half a tablet 3 times per day.  Initial follow-up notes more month later indicate that the patient thought that the medication was helpful.  In February, 2016 she was started on Azilect but it was expensive and she thought that it caused swelling and so pt d/c it.  However, once she d/c she thought that she got worse so she restarted it.  In July, 2016 it was attempted to discontinue the levodopa and try her on Neupro patch.  The patient felt that she did worse with this and she stopped patch.  The patient saw Dr. Rexene [name] in May, 2017 for a second opinion.  Dr. Rexene [name] stated "she does not give any sinister atypical history for atypical parkinsonism but does not have a very strong history or telltale exam for idiopathic Parkinson's disease either.  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   Specific Symptoms:  Tremor: No. Family hx of similar:  No. Voice: patients husband thinks that voice is same; pt thinks that it is deeper Sleep: patient states that she doesn't sleep well (but states that it is because of husbands electric blanket makes her hot)  Vivid Dreams:  Yes.    Acting out dreams:  Yes.  , screams out x several years but no falling out of the bed Wet Pillows: Yes.   Postural symptoms:  Yes.   x 3-4 years and thinks that perhaps due to back surgeries.  Used walker x several years  Falls? Only one time in march 2017 - trying to put something on bed and fell; few other times bent down and "eased down" to floor (for example at file cabinet) Bradykinesia symptoms: slow movements, difficulty getting out of a chair and small steps Loss of smell:  No. Loss of taste:  No. Urinary Incontinence:  Yes.   x 1 year (cannot get to bathroom fast enough) Difficulty Swallowing:  :not really" Handwriting, micrographia: Yes.   Trouble with ADL's:  Yes.   but better than she could (trouble with socks)  Trouble buttoning clothing: No. Depression:  No. Memory changes:  No. Hallucinations:  No.  visual distortions:  No., but does have some floaters and sees Dr. Posey [name] N/V:  No. Lightheaded:  No.  Syncope: No. Diplopia:  Yes.   x 3-4 months (horizontal, goes away if closes eye, comes and goes, doesn't know if time of day, doesn't know if better if after nap; sometimes gets told to open L eye wider but not sure if has ptosis) Dyskinesia:  No.  Neuroimaging has previously been performed.  It is available for my review today.  I reviewed the patient's MRI of the brain dated 11/28/2013.  There was mild atrophy and a few T2 hyperintensities.  05/11/16 update:  Pt presents for levodopa challenge test.   She has been off of levodopa since 4:30 pm.  She has also held her pramipexole, 0.5 mg q hs that she takes for RLS.  Her legs are burning.  Her legs feel restless.  She doesn't feel more stiff off of medication.  Her main issues off of drug were at night (RLS).    09/17/16 update:  Patient presents today.  She is on carbidopa/levodopa 25/100, 2 tablets 4 times per day.  She is also on pramipexole 0.5 mg at night.  She did go to physical therapy since our last visit.  Those notes were reviewed.  She had a modified barium swallow since our last visit that was normal.  Pt denies falls.  Pt denies lightheadedness, near syncope.  Husband states that she talks in her sleep.  One day she thought she had a hallucination and "something" ran in front of her but never recurred.  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  This patient is accompanied in the office by her her husband who supplements the history.  She is on carbidopa/levodopa 25/100, 2 po qid. She is also on pramipexole 0.5 mg at night, primarily for restless leg syndrome.  States that a few times a week, she gets up in the middle of the night and takes an extra.  The records that were made available to me were reviewed.  Pt denies falls.  Pt denies lightheadedness, near syncope.  No hallucinations.  Mood is "fair" until her husband "gets upset."  States that her husband was dx with mild dementia and she is having to take care of him.  admts she is not using her walker/cane faithfully.  No diplopia.    05/24/17 update: Patient seen in follow-up for parkinsonism.  She is accompanied by her husband who supplements the history.  She is on carbidopa/levodopa 25/100, 2 tablets 4 times per day.  She is also on pramipexole 0.5 mg at night.  She has a rare hallucination of something running across the floor but it doesn't happen daily.  No lightheadedness  or near syncope.  Her mood has been fair (stable).  She has had no falls since last visit.  She has been back to see Dr. Annamaria Boots since our last visit and I reviewed his records.  States that her PCP ordered her to start gabapentin 300 mg q day and she is waiting for it from the mail order pharmacy.  It is for feet paresthesias.  States that her husband had C.diff following diverticulitis and she wonders if she didn't have c.diff but she is better now.  She states that she occasionally will forget her cane places but "that's how I know I am getting better."  She also recently  got out of the bathtub by herself which is an improvement.  09/06/17 update: The patient is seen today in follow-up.  She is accompanied by her husband who supplements the history.  The patient is on carbidopa/levodopa 25/100, 2 tablets 4 times per day (last taken at 8am and seen at 1pm.  Forgot her noon dosage)   and pramipexole 0.5 mg at night, which is primarily for restless leg.  She has had one "tumble" where she was leaning to pick up something and fell on the floor.  Didn't get hurt.  No lightheadedness or near syncope.  No hallucinations.  Mood is usually good.   Records have been reviewed since last visit.  Appears that she did call to her ID physician about diarrhea and was given a stool kit.  Her husband had c.diff and she thinks that she got it from him.  She got vancomycin.  She is planning on going to [place] in a few weeks.    02/09/18 update: Patient seen today in follow-up for parkinsonism.  She is accompanied by her husband who supplements the history.  She is on carbidopa/levodopa 25/100, 2 tablets 4 times per day and pramipexole 0.5 mg at night for restless leg.  She denies lightheadedness or near syncope.  She has rare intermittent diplopia. She did hurt her back cleaning windows.  This has limited her exercise.   Records are reviewed since last visit.  She saw [name] on Nov 02, 2017.  She is on Coumadin for paroxysmal A. fib.   No changes were made last visit.  Going to see Dr. Cristina Gong next week for bloating and abdominal pain.  She doesn't think that she is constipated as on a probiotic that helps with constipation.    PREVIOUS MEDICATIONS: Sinemet, Mirapex and neupro  ALLERGIES:   Allergies  Allergen Reactions  . Lyrica [Pregabalin] Other (See Comments)    Felt loopy  . Zocor [Simvastatin] Other (See Comments)    Myalgias  . Relafen [Nabumetone] Rash    CURRENT MEDICATIONS:  Outpatient Encounter Medications as of 02/09/2018  Medication Sig  . acetaminophen (TYLENOL) 500 MG tablet Take 1,000 mg by mouth daily as needed (pain).  . calcium carbonate (TUMS - DOSED IN MG ELEMENTAL CALCIUM) 500 MG chewable tablet Chew 1 tablet by mouth at bedtime.  . carbidopa-levodopa (SINEMET IR) 25-100 MG tablet Take 2 tablets by mouth 4 (four) times daily.  . fexofenadine (ALLEGRA) 180 MG tablet Take 180 mg by mouth as needed. For allergies  . flecainide (TAMBOCOR) 50 MG tablet Take 1 tablet (50 mg total) by mouth 2 (two) times daily.  . furosemide (LASIX) 20 MG tablet Take 40 mg by mouth daily.   Marland Kitchen gabapentin (NEURONTIN) 300 MG capsule Take 300 mg by mouth at bedtime.  . Magnesium 400 MG TABS Take 400 mg by mouth at bedtime.  Marland Kitchen MEGARED OMEGA-3 KRILL OIL 500 MG CAPS Take 500 mg by mouth daily.  . metoprolol succinate (TOPROL-XL) 100 MG 24 hr tablet TAKE 1 TABLET BY MOUTH DAILY  . potassium chloride SA (K-DUR,KLOR-CON) 20 MEQ tablet Take 40 mEq by mouth 2 (two) times daily.   . pramipexole (MIRAPEX) 0.5 MG tablet Take 0.5 mg by mouth at bedtime. For restless legs  . pravastatin (PRAVACHOL) 40 MG tablet Take 40 mg by mouth at bedtime.   . Probiotic Product (PROBIOTIC-10 PO) Take by mouth daily.  . TRAVATAN Z 0.004 % SOLN ophthalmic solution   . warfarin (COUMADIN) 5 MG tablet TAKE  ONE-HALF TO ONE TABLET BY MOUTH DAILY or AS DIRECTED BY COUMADIN CLINIC AFTER HAVING INR CHECKED  . [DISCONTINUED] benzonatate (TESSALON) 200 MG  capsule Take 1 capsule (200 mg total) by mouth 3 (three) times daily as needed for cough.  . [DISCONTINUED] metolazone (ZAROXOLYN) 5 MG tablet Take 5 mg by mouth daily.   No facility-administered encounter medications on file as of 02/09/2018.     PAST MEDICAL HISTORY:   Past Medical History:  Diagnosis Date  . Allergic rhinitis   . Arthritis    "all over"  . CHF (congestive heart failure) ([place])   . Chronic back pain   . DJD (degenerative joint disease)   . GERD (gastroesophageal reflux disease)   . Hyperlipidemia   . Hypertension   . Lumbar stenosis   . OSA (obstructive sleep apnea)    "couldn't handle CPAP; use mouth guard some; not all the time" (01/06/2014)  . PAF (paroxysmal atrial fibrillation) ([place])   . Scoliosis    with radiculopathy L2-S1 with prior surgery  . Small bowel obstruction ([facility])    versus ileus after last bck surgery  . Spondylosis     PAST SURGICAL HISTORY:   Past Surgical History:  Procedure Laterality Date  . BACK SURGERY    . CATARACT EXTRACTION W/ INTRAOCULAR LENS  IMPLANT, BILATERAL  2011  . COLONOSCOPY  12/09/2004  . LIPOMA EXCISION  1980's   "fatty tumors"  . Fleming-Neon SURGERY  02/2009   "ruptured disc"  . NASAL SEPTUM SURGERY  80's  . POSTERIOR LUMBAR FUSION  06/2010; 10/2011   "placed screws, rods, spacers both times"  . REPAIR DURAL / CSF LEAK  02/2009  . TOTAL ABDOMINAL HYSTERECTOMY  02/1993    SOCIAL HISTORY:   Social History   Socioeconomic History  . Marital status: Married    Spouse name: [name]  . Number of children: 2  . Years of education: 12th   . Highest education level: Not on file  Occupational History  . Occupation: retired    Fish farm manager: RETIRED    Comment: clerical office work  Social Needs  . Financial resource strain: Not on file  . Food insecurity:    Worry: Not on file    Inability: Not on file  . Transportation needs:    Medical: Not on file    Non-medical: Not on file  Tobacco Use  . Smoking status: Never  Smoker  . Smokeless tobacco: Never Used  Substance and Sexual Activity  . Alcohol use: No    Alcohol/week: 0.0 standard drinks    Comment: 02/01/2014 " glasseof wine once in a blue moon; < once/year"  . Drug use: No  . Sexual activity: Never    Comment: hysterectomy  Lifestyle  . Physical activity:    Days per week: Not on file    Minutes per session: Not on file  . Stress: Not on file  Relationships  . Social connections:    Talks on phone: Not on file    Gets together: Not on file    Attends religious service: Not on file    Active member of club or organization: Not on file    Attends meetings of clubs or organizations: Not on file    Relationship status: Not on file  . Intimate partner violence:    Fear of current or ex partner: Not on file    Emotionally abused: Not on file    Physically abused: Not on file    Forced sexual  activity: Not on file  Other Topics Concern  . Not on file  Social History Narrative   Patient lives at home with husband [name].   Patient is retired.   Patient has a high school education.    Patient has 2 children     FAMILY HISTORY:   Family Status  Relation Name Status  . Mother  Deceased at age 34       natural causes  . Father  Deceased at age 4       MI  . MGM  Deceased  . MGF  Deceased  . PGM  Deceased  . PGF  Deceased  . Sister  Deceased  . Sister  (Not Specified)  . Sister  (Not Specified)  . Daughter 2 Alive    ROS:  Review of Systems  Constitutional: Negative.   HENT: Negative.   Eyes: Positive for double vision (intermittent).  Gastrointestinal: Positive for abdominal pain.  Genitourinary: Negative.   Musculoskeletal: Positive for back pain.  Skin: Negative.   Endo/Heme/Allergies: Negative.   Psychiatric/Behavioral: Negative.     PHYSICAL EXAMINATION:    VITALS:   Vitals:   02/09/18 1316  BP: (!) 144/88  Pulse: 64  SpO2: 96%  Weight: 192 lb (87.1 kg)  Height: _0  (1.651 m)    GEN:  The patient appears  stated age and is in NAD. HEENT:  Normocephalic, atraumatic.  The mucous membranes are moist. The superficial temporal arteries are without ropiness or tenderness. CV:  RRR Lungs:  CTAB Neck/HEME:  There are no carotid bruits bilaterally.  Neurological examination:  Orientation: The patient is alert and oriented x3.  Cranial nerves: There is good facial symmetry.  Extraocular muscles are intact.  She does have a few square wave jerks.  The visual fields are full to confrontational testing. The speech is fluent and clear but she has a slight pseudobulbar quality to the speech.  She does not have significant difficulty with guttural sounds.  Soft palate rises symmetrically and there is no tongue deviation. Hearing is intact to conversational tone. Sensation: Sensation is intact to light touch throughout Motor: Strength is 5/5 in the bilateral upper and lower extremities.   Shoulder shrug is equal and symmetric.  There is no pronator drift.  No fasciculations noted.  Movement examination:  Tone: has mild rigidity in the right upper and left upper extremity (has not taken noon dose and was seen at 1:30 PM  -this was similar to the last several visits).   Abnormal movements: no dyskinesia today Coordination:  There is decremation with RAM's, with toe taps on the L Gait and Station: The patient uses her walker and walks very well with that.    LABS:   Labs received from primary care physician from 07/16/2016.  Sodium was 142, potassium 3.6, chloride 103, CO2 30, BUN 19, creatinine 1.05.  AST 14, ALT 4, glucose 87.  ASSESSMENT/PLAN:  1.  Parkinsonism  -While the patient may have idiopathic Parkinson's disease, I am keeping PSP on the list of differential diagnoses..  She has some square wave jerks, diplopia (within the first 3 years of onset), and mild downgaze paresis.  We talked about nature, etiology and pathophysiology.   -Levodopa challenge test was done and levodopa was only of mild  benefit.  However, the patient does think that it is helpful and it did help the rigidity in the left arm.  In addition, it helped her restless leg as does the pramipexole.  Will need  to closely watch for hallucinations.    - Continue carbidopa/levodopa 25/100, 2 tablets 4 times per day.  Reminded her about taking it on time.  -She will continue her pramipexole, 0.5 mg at night that she takes for restless leg.  -told patient she needed to use walker at all times  -told her she needs to add in safe, CV exercise  -Modified barium swallow on 05/21/2016 was normal.  -invited to PARTS program   2.  feet parasthesias  -she is now on gabapentin.  Not sure it is helping. PCP managing  3. Follow up is anticipated in the next 5 months, sooner should new neurologic issues arise.      Cc:  [name] Arabian, MD

## 2018-02-09 ENCOUNTER — Ambulatory Visit: Payer: Medicare Other | Admitting: Neurology

## 2018-02-09 ENCOUNTER — Encounter: Payer: Self-pay | Admitting: Neurology

## 2018-02-09 VITALS — BP 144/88 | HR 64 | Ht 65.0 in | Wt 192.0 lb

## 2018-02-09 DIAGNOSIS — G20 Parkinson's disease: Secondary | ICD-10-CM

## 2018-02-16 DIAGNOSIS — N183 Chronic kidney disease, stage 3 (moderate): Secondary | ICD-10-CM | POA: Diagnosis not present

## 2018-02-16 DIAGNOSIS — H409 Unspecified glaucoma: Secondary | ICD-10-CM | POA: Diagnosis not present

## 2018-02-16 DIAGNOSIS — I4891 Unspecified atrial fibrillation: Secondary | ICD-10-CM | POA: Diagnosis not present

## 2018-02-16 DIAGNOSIS — G20 Parkinson's disease: Secondary | ICD-10-CM | POA: Diagnosis not present

## 2018-02-24 DIAGNOSIS — R198 Other specified symptoms and signs involving the digestive system and abdomen: Secondary | ICD-10-CM | POA: Diagnosis not present

## 2018-02-24 DIAGNOSIS — Z86010 Personal history of colonic polyps: Secondary | ICD-10-CM | POA: Diagnosis not present

## 2018-02-24 DIAGNOSIS — K219 Gastro-esophageal reflux disease without esophagitis: Secondary | ICD-10-CM | POA: Diagnosis not present

## 2018-02-24 DIAGNOSIS — R1084 Generalized abdominal pain: Secondary | ICD-10-CM | POA: Diagnosis not present

## 2018-02-26 ENCOUNTER — Other Ambulatory Visit: Payer: Self-pay | Admitting: Cardiovascular Disease

## 2018-03-02 ENCOUNTER — Ambulatory Visit: Payer: Medicare Other

## 2018-03-02 DIAGNOSIS — Z5181 Encounter for therapeutic drug level monitoring: Secondary | ICD-10-CM | POA: Diagnosis not present

## 2018-03-02 DIAGNOSIS — I4891 Unspecified atrial fibrillation: Secondary | ICD-10-CM

## 2018-03-02 LAB — POCT INR: INR: 3 (ref 2.0–3.0)

## 2018-03-02 NOTE — Patient Instructions (Signed)
Description   Continue on same dosage 1/2 tablet daily.  Recheck INR in 6 weeks. Call if placed on any new medications  [phone]

## 2018-03-10 DIAGNOSIS — Z23 Encounter for immunization: Secondary | ICD-10-CM | POA: Diagnosis not present

## 2018-03-13 DIAGNOSIS — R1010 Upper abdominal pain, unspecified: Secondary | ICD-10-CM | POA: Diagnosis not present

## 2018-03-13 DIAGNOSIS — R198 Other specified symptoms and signs involving the digestive system and abdomen: Secondary | ICD-10-CM | POA: Diagnosis not present

## 2018-03-13 DIAGNOSIS — K219 Gastro-esophageal reflux disease without esophagitis: Secondary | ICD-10-CM | POA: Diagnosis not present

## 2018-03-27 DIAGNOSIS — H43811 Vitreous degeneration, right eye: Secondary | ICD-10-CM | POA: Diagnosis not present

## 2018-03-27 DIAGNOSIS — H00011 Hordeolum externum right upper eyelid: Secondary | ICD-10-CM | POA: Diagnosis not present

## 2018-03-27 DIAGNOSIS — H532 Diplopia: Secondary | ICD-10-CM | POA: Diagnosis not present

## 2018-03-27 DIAGNOSIS — H401131 Primary open-angle glaucoma, bilateral, mild stage: Secondary | ICD-10-CM | POA: Diagnosis not present

## 2018-04-11 DIAGNOSIS — M549 Dorsalgia, unspecified: Secondary | ICD-10-CM | POA: Diagnosis not present

## 2018-04-13 ENCOUNTER — Ambulatory Visit: Payer: Medicare Other | Admitting: Pharmacist

## 2018-04-13 DIAGNOSIS — I4891 Unspecified atrial fibrillation: Secondary | ICD-10-CM | POA: Diagnosis not present

## 2018-04-13 DIAGNOSIS — Z5181 Encounter for therapeutic drug level monitoring: Secondary | ICD-10-CM | POA: Diagnosis not present

## 2018-04-13 LAB — POCT INR: INR: 3.8 — AB (ref 2.0–3.0)

## 2018-04-13 NOTE — Patient Instructions (Signed)
Description   Skip your Coumadin tomorrow, then continue on same dosage 1/2 tablet daily.  Recheck INR in 3 weeks. Call if placed on any new medications  [phone]

## 2018-04-24 DIAGNOSIS — M545 Low back pain: Secondary | ICD-10-CM | POA: Diagnosis not present

## 2018-04-25 DIAGNOSIS — G20 Parkinson's disease: Secondary | ICD-10-CM | POA: Diagnosis not present

## 2018-04-25 DIAGNOSIS — H409 Unspecified glaucoma: Secondary | ICD-10-CM | POA: Diagnosis not present

## 2018-04-25 DIAGNOSIS — I4891 Unspecified atrial fibrillation: Secondary | ICD-10-CM | POA: Diagnosis not present

## 2018-04-25 DIAGNOSIS — N183 Chronic kidney disease, stage 3 (moderate): Secondary | ICD-10-CM | POA: Diagnosis not present

## 2018-05-03 ENCOUNTER — Ambulatory Visit: Payer: Medicare Other

## 2018-05-03 DIAGNOSIS — I4891 Unspecified atrial fibrillation: Secondary | ICD-10-CM | POA: Diagnosis not present

## 2018-05-03 DIAGNOSIS — Z5181 Encounter for therapeutic drug level monitoring: Secondary | ICD-10-CM

## 2018-05-03 LAB — POCT INR: INR: 3.3 — AB (ref 2.0–3.0)

## 2018-05-03 NOTE — Patient Instructions (Signed)
Description   Skip your Coumadin tomorrow, then continue on same dosage 1/2 tablet daily.  Recheck INR in 3 weeks. Call if placed on any new medications  [phone]

## 2018-05-05 ENCOUNTER — Ambulatory Visit: Payer: Medicare Other | Admitting: Primary Care

## 2018-05-05 ENCOUNTER — Ambulatory Visit: Payer: Medicare Other | Admitting: Internal Medicine

## 2018-05-05 ENCOUNTER — Encounter: Payer: Self-pay | Admitting: Primary Care

## 2018-05-05 DIAGNOSIS — G4733 Obstructive sleep apnea (adult) (pediatric): Secondary | ICD-10-CM | POA: Diagnosis not present

## 2018-05-05 DIAGNOSIS — J3089 Other allergic rhinitis: Secondary | ICD-10-CM

## 2018-05-05 DIAGNOSIS — J302 Other seasonal allergic rhinitis: Secondary | ICD-10-CM

## 2018-05-05 NOTE — Progress Notes (Signed)
@Patient  ID: [name], female    DOB: Sep 03, 1940, 77 y.o.   MRN: [id number]  Chief Complaint  Patient presents with  . Follow-up    Doing well at this time she is having some thick mucus production pale yellow in the morning.    Referring provider: [name], MD  HPI: 77 year old female, never smoked. PMH OSA, afib (on warfarin), parkinsons. NPSG 01/25/08- AHI 6/ hr. Not currently on therapy, previous had an oral appliance from Dr. Ron Parker. Patient of Dr. Annamaria Boots, last seen by pulmonary NP 6 months ago and treated for acute bronchitis with doxycyline course.   05/05/2018 Patient presents today for annual follow-up. Feels well, no acute complaints. Breathing is good. Complains of morning congestion and cough. Takes antihistamine as needed. She stopped wearing dental device for OSA 2-3 years ago, states that she can't sleep with it. Husband reports that she doesn't snore much. Sleeping ok, gets hot in the bed and has back pain. Starting physical therapy next week. Denies excessive daytime fatigue, colored mucus or wheezing.    Allergies  Allergen Reactions  . Lyrica [Pregabalin] Other (See Comments)    Felt loopy  . Zocor [Simvastatin] Other (See Comments)    Myalgias  . Relafen [Nabumetone] Rash    Immunization History  Administered Date(s) Administered  . DTaP 02/24/2005  . H1N1 03/27/2008  . Influenza Split 03/14/2009, 03/20/2010, 04/14/2011, 03/02/2012, 03/26/2013, 02/27/2015, 03/07/2016  . Influenza Whole 03/20/2010  . Influenza, High Dose Seasonal PF 03/01/2014, 02/27/2015, 03/30/2017, 02/08/2018  . Influenza-Unspecified 03/07/2014  . Pneumococcal Conjugate-13 04/17/2013, 07/11/2015  . Pneumococcal Polysaccharide-23 05/12/2009  . Tdap 02/24/2005, 05/21/2011  . Zoster 05/18/2010, 03/22/2017  . Zoster Recombinat (Shingrix) 09/07/2017    Past Medical History:  Diagnosis Date  . Allergic rhinitis   . Arthritis    "all over"  . CHF (congestive heart failure)  ([place])   . Chronic back pain   . DJD (degenerative joint disease)   . GERD (gastroesophageal reflux disease)   . Hyperlipidemia   . Hypertension   . Lumbar stenosis   . OSA (obstructive sleep apnea)    "couldn't handle CPAP; use mouth guard some; not all the time" (01/06/2014)  . PAF (paroxysmal atrial fibrillation) ([place])   . Scoliosis    with radiculopathy L2-S1 with prior surgery  . Small bowel obstruction ([facility])    versus ileus after last bck surgery  . Spondylosis     Tobacco History: Social History   Tobacco Use  Smoking Status Never Smoker  Smokeless Tobacco Never Used   Counseling given: Not Answered   Outpatient Medications Prior to Visit  Medication Sig Dispense Refill  . acetaminophen (TYLENOL) 500 MG tablet Take 1,000 mg by mouth daily as needed (pain).    . calcium carbonate (TUMS - DOSED IN MG ELEMENTAL CALCIUM) 500 MG chewable tablet Chew 1 tablet by mouth at bedtime.    . carbidopa-levodopa (SINEMET IR) 25-100 MG tablet Take 2 tablets by mouth 4 (four) times daily. 720 tablet 1  . fexofenadine (ALLEGRA) 180 MG tablet Take 180 mg by mouth as needed. For allergies    . flecainide (TAMBOCOR) 50 MG tablet TAKE 1 TABLET BY MOUTH 2 TIMES DAILY 180 tablet 2  . furosemide (LASIX) 20 MG tablet Take 40 mg by mouth as needed.     . gabapentin (NEURONTIN) 300 MG capsule Take 300 mg by mouth at bedtime.    . metoprolol succinate (TOPROL-XL) 100 MG 24 hr tablet TAKE 1 TABLET BY  MOUTH DAILY 90 tablet 2  . potassium chloride SA (K-DUR,KLOR-CON) 20 MEQ tablet Take 40 mEq by mouth 2 (two) times daily.     . pramipexole (MIRAPEX) 0.5 MG tablet Take 0.5 mg by mouth at bedtime. For restless legs    . pravastatin (PRAVACHOL) 40 MG tablet Take 40 mg by mouth at bedtime.     . Probiotic Product (PROBIOTIC-10 PO) Take by mouth daily.    . TRAVATAN Z 0.004 % SOLN ophthalmic solution   0  . warfarin (COUMADIN) 5 MG tablet TAKE ONE-HALF TO ONE TABLET BY MOUTH DAILY or AS DIRECTED BY COUMADIN  CLINIC AFTER HAVING INR CHECKED 80 tablet 0  . pantoprazole (PROTONIX) 40 MG tablet Take 40 mg by mouth 2 (two) times daily.    Marland Kitchen MEGARED OMEGA-3 KRILL OIL 500 MG CAPS Take 500 mg by mouth daily.     No facility-administered medications prior to visit.     Review of Systems  Review of Systems  Constitutional: Negative.   HENT: Negative.   Respiratory: Positive for cough. Negative for apnea, shortness of breath and wheezing.   Cardiovascular: Negative.     Physical Exam  BP 124/84 (BP Location: Left Arm, Cuff Size: Normal)   Pulse 83   SpO2 96%  Physical Exam  Constitutional: She is oriented to person, place, and time. She appears well-developed and well-nourished. No distress.  HENT:  Head: Normocephalic and atraumatic.  Eyes: Pupils are equal, round, and reactive to light. EOM are normal.  Neck: Normal range of motion. Neck supple.  Cardiovascular: Normal rate and regular rhythm.  Pulmonary/Chest: Effort normal and breath sounds normal. She has no wheezes.  Musculoskeletal: Normal range of motion.  Ambulated with rolling walker   Neurological: She is alert and oriented to person, place, and time.  Skin: Skin is warm and dry.  Psychiatric: She has a normal mood and affect. Her behavior is normal. Judgment and thought content normal.     Lab Results:  CBC    Component Value Date/Time   WBC 4.3 02/01/2014 0714   RBC 4.04 02/01/2014 0714   HGB 11.5 (L) 02/01/2014 0714   HCT 35.7 (L) 02/01/2014 0714   PLT 119 (L) 02/01/2014 0714   MCV 88.4 02/01/2014 0714   MCH 28.5 02/01/2014 0714   MCHC 32.2 02/01/2014 0714   RDW 15.2 02/01/2014 0714   LYMPHSABS 1.1 02/01/2014 0714   MONOABS 0.3 02/01/2014 0714   EOSABS 0.1 02/01/2014 0714   BASOSABS 0.0 02/01/2014 0714    BMET    Component Value Date/Time   NA 142 02/01/2014 0714   K 3.9 02/01/2014 0714   CL 105 02/01/2014 0714   CO2 23 02/01/2014 0714   GLUCOSE 86 02/01/2014 0714   BUN 21 02/01/2014 0714   CREATININE  1.10 02/01/2014 0714   CALCIUM 8.7 02/01/2014 0714   GFRNONAA 49 (L) 02/01/2014 0714   GFRAA 57 (L) 02/01/2014 0714    BNP No results found for: BNP  ProBNP    Component Value Date/Time   PROBNP 4,386.0 (H) 02/01/2014 0714    Imaging: No results found.   Assessment & Plan:   Seasonal and perennial allergic rhinitis - Reports morning congestion and cough - Advised mucinex twice daily and daily antihistamine as needed for morning congestion     Obstructive sleep apnea - Mild sleep apnea on NPSG  - Not currently on therapy, Dr. Annamaria Boots aware  - Denies excessive daytime fatigue and somnolence    Follow up annually  and as needed    Martyn Ehrich, NP 05/05/2018

## 2018-05-05 NOTE — Patient Instructions (Addendum)
Everything looked well today  Take mucinex twice daily and daily antihistamine as needed for morning congestion   Return if you develop cough with colored mucus, increased wheezing or shortness of breath  Do not drive if experiencing excessive daytime fatigue or somnolence  Follow up with DR. Young in 1 year

## 2018-05-05 NOTE — Assessment & Plan Note (Signed)
-   Mild sleep apnea on NPSG  - Not currently on therapy, Dr. Annamaria Boots aware  - Denies excessive daytime fatigue and somnolence

## 2018-05-05 NOTE — Assessment & Plan Note (Signed)
-   Reports morning congestion and cough - Advised mucinex twice daily and daily antihistamine as needed for morning congestion

## 2018-05-08 ENCOUNTER — Other Ambulatory Visit: Payer: Self-pay

## 2018-05-08 ENCOUNTER — Encounter: Payer: Self-pay | Admitting: Rehabilitation

## 2018-05-08 ENCOUNTER — Ambulatory Visit: Payer: Medicare Other | Attending: Family Medicine | Admitting: Rehabilitation

## 2018-05-08 DIAGNOSIS — R293 Abnormal posture: Secondary | ICD-10-CM | POA: Insufficient documentation

## 2018-05-08 DIAGNOSIS — M6281 Muscle weakness (generalized): Secondary | ICD-10-CM

## 2018-05-08 DIAGNOSIS — R2689 Other abnormalities of gait and mobility: Secondary | ICD-10-CM | POA: Diagnosis not present

## 2018-05-08 DIAGNOSIS — M545 Low back pain, unspecified: Secondary | ICD-10-CM

## 2018-05-08 DIAGNOSIS — R2681 Unsteadiness on feet: Secondary | ICD-10-CM | POA: Insufficient documentation

## 2018-05-08 NOTE — Therapy (Signed)
[facility] [address] [place], [place], [address] Phone: [phone]   Fax:  [phone]  Physical Therapy Evaluation  Patient Details  Name: [name] MRN: [id number] Date of Birth: Feb 06, 1941 Referring Provider (PT): [name], MD   Encounter Date: 05/08/2018  PT End of Session - 05/08/18 1218    Visit Number  1    Number of Visits  17   recommended 2x/wk, they plan to only do 1x/wk due to finances   Date for PT Re-Evaluation  08/06/18   only plan to see for 60 days   Authorization Type  [employer]-10th visit progress note    PT Start Time  1105    PT Stop Time  1150    PT Time Calculation (min)  45 min    Activity Tolerance  Patient limited by pain    Behavior During Therapy  [facility] for tasks assessed/performed       Past Medical History:  Diagnosis Date  . Allergic rhinitis   . Arthritis    "all over"  . CHF (congestive heart failure) ([place])   . Chronic back pain   . DJD (degenerative joint disease)   . GERD (gastroesophageal reflux disease)   . Hyperlipidemia   . Hypertension   . Lumbar stenosis   . OSA (obstructive sleep apnea)    "couldn't handle CPAP; use mouth guard some; not all the time" (01/06/2014)  . PAF (paroxysmal atrial fibrillation) ([place])   . Scoliosis    with radiculopathy L2-S1 with prior surgery  . Small bowel obstruction (HCC)    versus ileus after last bck surgery  . Spondylosis     Past Surgical History:  Procedure Laterality Date  . BACK SURGERY    . CATARACT EXTRACTION W/ INTRAOCULAR LENS  IMPLANT, BILATERAL  2011  . COLONOSCOPY  12/09/2004  . LIPOMA EXCISION  1980's   "fatty tumors"  . [place] SURGERY  02/2009   "ruptured disc"  . NASAL SEPTUM SURGERY  80's  . POSTERIOR LUMBAR FUSION  06/2010; 10/2011   "placed screws, rods, spacers both times"  . REPAIR DURAL / CSF LEAK  02/2009  . TOTAL ABDOMINAL HYSTERECTOMY  02/1993    There were no vitals filed  for this visit.   Subjective Assessment - 05/08/18 1112    Subjective  "I hurt my back.  I think I did it when I was washing windows.  Since I went to the doctor, it has been popping a lot, esp in the last few days."      Patient is accompained by:  Family member   husband [name]   Pertinent History  Parkinsonism, afib, CHF, DJD, stenosis    Limitations  Walking;Standing;House hold activities    How long can you stand comfortably?  3-4 mins     How long can you walk comfortably?  20-30 mins (able to do grocery shopping)    Patient Stated Goals  "to decrease this back pain"     Currently in Pain?  Yes    Pain Score  7     Pain Location  Back    Pain Orientation  Right;Left    Pain Descriptors / Indicators  Sharp    Pain Type  Acute pain    Pain Onset  More than a month ago    Pain Frequency  Constant    Aggravating Factors   laying in the bed    Pain Relieving Factors  getting out of  the bed (has tried heat/cold, doesn't help)         [facility] PT Assessment - 05/08/18 1119      Assessment   Medical Diagnosis  Low back pain    Referring Provider (PT)  [name], MD    Onset Date/Surgical Date  --   about 2-3 months ago     Precautions   Precautions  Fall;Back   no formal back precautions   Precaution Comments  Hx of Parkinsonism, Note pt has had several low back surgeries, all of which are to low back region, last one in 2013 with one level lumbar fusion.        Balance Screen   Has the patient fallen in the past 6 months  No    Has the patient had a decrease in activity level because of a fear of falling?   No    Is the patient reluctant to leave their home because of a fear of falling?   No      Home Environment   Living Environment  Private residence    Living Arrangements  Spouse/significant other    Available Help at Discharge  Family;Available 24 hours/day    Type of Home  House    Home Access  Stairs to enter    Entrance Stairs-Number of Steps  4    Entrance  Stairs-Rails  Left    Home Layout  One level    Home Equipment  Grab bars - tub/shower;Walker - 4 wheels;Shower seat   tub/shower      Prior Function   Level of Independence  Independent with community mobility with device;Independent with household mobility with device   cane in house, rollator out of house   Vocation  Retired    Leisure  Pt doesn't report any activities that they currently do       Sensation   Light Touch  Impaired Detail    Light Touch Impaired Details  Impaired RLE;Impaired LLE   just bottoms of B feet   Hot/Cold  Appears Intact      Posture/Postural Control   Posture/Postural Control  Postural limitations    Postural Limitations  Rounded Shoulders;Forward head;Increased thoracic kyphosis;Flexed trunk;Posterior pelvic tilt      ROM / Strength   AROM / PROM / Strength  Strength      Strength   Overall Strength  Deficits    Overall Strength Comments  RLE grossly 4/5, LLE grossly 4+/5, except for L ankle DF 2-/5      Flexibility   Soft Tissue Assessment /Muscle Length  yes    Hamstrings  decreased flexibility with seated assessment (pt had difficulty lying on back)    Piriformis  Pt with increased tightness in L piriformis>R with seated leg crossed over opposite leg.       Palpation   Palpation comment  Has increased muscle tightness in B paraspinals in lumbar region (R>L)      Bed Mobility   Bed Mobility  Rolling Right;Right Sidelying to Sit    Rolling Right  Supervision/verbal cueing    Rolling Right Details (indicate cue type and reason)  Cues for moving body in block to avoid twisting    Right Sidelying to Sit  Supervision/Verbal cueing    Right Sidelying to Sit Details (indicate cue type and reason)  Min cues for breath control when transitioning.       Transfers   Transfers  Sit to Stand;Stand to Sit    Sit to  Stand  5: Supervision    Sit to Stand Details (indicate cue type and reason)  S for safety.  Pt with difficulty performing forward trunk  lean     Stand to Sit  5: Supervision    Stand to Sit Details  S for safety                 Objective measurements completed on examination: See above findings.      [facility] PT Treatment/Exercise - 05/08/18 1119      Self-Care   Self-Care  Other Self-Care Comments    Other Self-Care Comments   Educated and performed seated hamstring stretch during session x 30 secs on each side.  Verbally provided for HEP, will formally add at next visit.  Also provided pt with tennis ball to perform deep pressure to lower back paraspinals.  Pt tolerated well during session.  Also educated that PT would send note to MD regarding getting image of spine due to age and new onset of popping.              PT Education - 05/08/18 1218    Education Details  evaluation findings, POC, goals, hamstring stretch and deep pressure with tennis ball    Person(s) Educated  Patient;Spouse    Methods  Explanation;Demonstration    Comprehension  Verbalized understanding;Returned demonstration       PT Short Term Goals - 05/08/18 1228      PT SHORT TERM GOAL #1   Title  Independent with HEP in order to indicate improved functional mobility and decreased pain.  (Target Date: 06/07/2018)    Time  4    Period  Weeks    Status  New    Target Date  06/07/18      PT SHORT TERM GOAL #2   Title  Will provide pt with Modified Oswestry and write LTG as appropriate.     Time  4    Period  Weeks    Status  New      PT SHORT TERM GOAL #3   Title  Will assess lumbar ROM and update goal/add LTG as able    Time  4    Period  Weeks    Status  New      PT SHORT TERM GOAL #4   Title  Pt will report no more than 6/10 pain when performing ADLs in order to indicate pain is decreased limiting factor.     Time  4    Period  Weeks    Status  New      PT SHORT TERM GOAL #5   Title  Pt will perform all aspects of bed mobility at mod I level with no more than 2 point increase in pain in order to indicate  improved ability to move in bed at home.     Time  4    Period  Days    Status  New        PT Long Term Goals - 05/08/18 1231      PT LONG TERM GOAL #1   Title  Pt will be IND with final HEP in order to indicate improved functional mobility and decreased pain.  (Target Date: 07/07/2018)    Time  8    Period  Weeks    Status  New    Target Date  07/07/18      PT LONG TERM GOAL #2   Title  Pt will report no  more than 5/10 pain with ADLs in order to indicate improved functional mobility.     Time  8    Period  Weeks    Status  New      PT LONG TERM GOAL #3   Title  Pt will verbal adherence to walking/community exercise program to maintain gains made in therapy.     Time  8    Period  Weeks    Status  New             Plan - 05/08/18 1220    Clinical Impression Statement  Pt presents with recent onset of low back pain which she reports started about 2-3 months ago following cleaning windows in home.  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  Upon PT evaluation, note that pt reports she has a very difficult time getting in/out of bed.  She is able to demonstrate how she does this within evaluation.  She gets into bed from a standing position and in the night ends up far from edge of bed and therefore has difficulty getting back to EOB to get up.  Pt with generlized weakness, esp in L ankle (2-/5), decreased flexibility in hamstrings and piriformis, poor posture and increased tightness and tenderness in lumbar paraspinals.  Pt will benefit from skilled OP neuro PT in order to address deficits.      History and Personal Factors relevant to plan of care:  see above    Clinical Presentation  Evolving    Clinical Presentation due to:  see above     Clinical Decision Making  Moderate    Rehab Potential  Good    Clinical Impairments Affecting Rehab Potential  co-morbidities, number of previous back surgeries, high co-pay    PT Frequency  2x / week   this is recommendation, however they are only willing to do 1x/wk 2/2 finances   PT Duration  8 weeks    PT Treatment/Interventions  ADLs/Self Care Home Management;Aquatic Therapy;Moist Heat;Electrical Stimulation;Ultrasound;DME Instruction;Gait training;Stair training;Functional mobility training;Therapeutic activities;Therapeutic exercise;Balance training;Neuromuscular re-education;Patient/family education;Orthotic Fit/Training;Manual techniques;Passive range of motion;Dry needling;Taping    PT Next Visit Plan  give modified oswestry, take lumbar ROM-set LTGs, give HEP for BLE flexibility and strengthening, core strength (has difficult time lying on back)    Recommended Other Services  See if MD responded to message regarding imaging     Consulted and Agree with Plan of Care  Patient;Family member/caregiver    Family Member Consulted  Husband [name]       Patient will benefit from skilled therapeutic intervention in order to improve the following deficits and impairments:  Decreased activity tolerance, Decreased balance, Decreased endurance, Decreased mobility, Decreased range of motion, Decreased strength, Difficulty walking, Hypomobility, Impaired perceived functional ability, Impaired flexibility, Impaired sensation, Postural dysfunction, Pain  Visit Diagnosis: Muscle weakness (generalized)  Abnormal posture  Acute bilateral low back pain, unspecified whether sciatica present  Other abnormalities of gait and mobility     Problem List Patient Active Problem List   Diagnosis Date Noted  . Acute bronchitis due to infection 05/04/2017  . Cough 04/21/2016  . Medication management 04/18/2015  . Parkinsonism ([place]) 05/15/2014  . Chronic anticoagulation -warfarin therapy 02/03/2014   . Atrial fibrillation with RVR ([place]) 01/31/2014  .  Gait instability 11/16/2013  . Encounter for therapeutic drug monitoring 07/20/2013  . Edema 03/03/2010  . ATRIAL FIBRILLATION 04/23/2008  . Elevated lipids 01/31/2008  . Obstructive sleep apnea 01/31/2008  . GLAUCOMA 01/30/2008  . Seasonal and perennial allergic rhinitis 01/30/2008  . DEGENERATIVE JOINT DISEASE 01/30/2008    Cameron Sprang, PT, MPT [facility] [address] [place], [place], [address] Phone: [phone]   Fax:  [phone] 05/08/18, 12:35 PM  Name: KAMEISHA MALICKI MRN: [id number] Date of Birth: May 12, 1941

## 2018-05-09 ENCOUNTER — Telehealth: Payer: Self-pay | Admitting: Rehabilitation

## 2018-05-09 NOTE — Telephone Encounter (Signed)
Entered in error

## 2018-05-15 DIAGNOSIS — R198 Other specified symptoms and signs involving the digestive system and abdomen: Secondary | ICD-10-CM | POA: Diagnosis not present

## 2018-05-15 DIAGNOSIS — K582 Mixed irritable bowel syndrome: Secondary | ICD-10-CM | POA: Diagnosis not present

## 2018-05-15 DIAGNOSIS — K219 Gastro-esophageal reflux disease without esophagitis: Secondary | ICD-10-CM | POA: Diagnosis not present

## 2018-05-16 ENCOUNTER — Other Ambulatory Visit: Payer: Self-pay | Admitting: Family Medicine

## 2018-05-16 ENCOUNTER — Ambulatory Visit
Admission: RE | Admit: 2018-05-16 | Discharge: 2018-05-16 | Disposition: A | Discharge status: Home / Self Care | Payer: Medicare Other | Source: Ambulatory Visit | Attending: Family Medicine | Admitting: Family Medicine

## 2018-05-16 DIAGNOSIS — M5489 Other dorsalgia: Secondary | ICD-10-CM

## 2018-05-16 DIAGNOSIS — M545 Low back pain: Secondary | ICD-10-CM | POA: Diagnosis not present

## 2018-05-19 ENCOUNTER — Ambulatory Visit: Payer: Medicare Other | Admitting: Physical Therapy

## 2018-05-19 ENCOUNTER — Encounter: Payer: Self-pay | Admitting: Physical Therapy

## 2018-05-19 DIAGNOSIS — R2681 Unsteadiness on feet: Secondary | ICD-10-CM | POA: Diagnosis not present

## 2018-05-19 DIAGNOSIS — R293 Abnormal posture: Secondary | ICD-10-CM

## 2018-05-19 DIAGNOSIS — R2689 Other abnormalities of gait and mobility: Secondary | ICD-10-CM

## 2018-05-19 DIAGNOSIS — M6281 Muscle weakness (generalized): Secondary | ICD-10-CM | POA: Diagnosis not present

## 2018-05-19 DIAGNOSIS — M545 Low back pain, unspecified: Secondary | ICD-10-CM

## 2018-05-19 NOTE — Patient Instructions (Signed)
Access Code: [email or web address]  URL: [email or web address]  Date: 05/19/2018  Prepared by: Willow Ora   Exercises  seated hamstring stretch - 3 reps - 1 sets - 15-20 hold - 1x daily - 5x weekly  Seated Pelvic Tilts - 10 reps - 1 sets - 1x daily - 5x weekly  Seated Knee Lifts with Resistance - 10 reps - 1 sets - 1x daily - 5x weekly

## 2018-05-21 NOTE — Therapy (Signed)
[facility] [address] [place], [place], [address] Phone: [phone]   Fax:  [phone]  Physical Therapy Treatment  Patient Details  Name: [name] MRN: [id number] Date of Birth: Nov 20, 1940 Referring Provider (PT): [name], MD   Encounter Date: 05/19/2018     05/19/18 1457  PT Visits / Re-Eval  Visit Number 2  Number of Visits 17 (recommended 2x/wk, they plan to only do 1x/wk due to finances)  Date for PT Re-Evaluation 08/06/18 (only plan to see for 60 days)  Authorization  Authorization Type [employer]-10th visit progress note  PT Time Calculation  PT Start Time 5427  PT Stop Time 1530  PT Time Calculation (min) 41 min  PT - End of Session  Activity Tolerance Patient limited by pain;Patient tolerated treatment well  Behavior During Therapy [facility] for tasks assessed/performed    Past Medical History:  Diagnosis Date  . Allergic rhinitis   . Arthritis    "all over"  . CHF (congestive heart failure) ([place])   . Chronic back pain   . DJD (degenerative joint disease)   . GERD (gastroesophageal reflux disease)   . Hyperlipidemia   . Hypertension   . Lumbar stenosis   . OSA (obstructive sleep apnea)    "couldn't handle CPAP; use mouth guard some; not all the time" (01/06/2014)  . PAF (paroxysmal atrial fibrillation) ([place])   . Scoliosis    with radiculopathy L2-S1 with prior surgery  . Small bowel obstruction (HCC)    versus ileus after last bck surgery  . Spondylosis     Past Surgical History:  Procedure Laterality Date  . BACK SURGERY    . CATARACT EXTRACTION W/ INTRAOCULAR LENS  IMPLANT, BILATERAL  2011  . COLONOSCOPY  12/09/2004  . LIPOMA EXCISION  1980's   "fatty tumors"  . [place] SURGERY  02/2009   "ruptured disc"  . NASAL SEPTUM SURGERY  80's  . POSTERIOR LUMBAR FUSION  06/2010; 10/2011   "placed screws, rods, spacers both times"  . REPAIR DURAL / CSF LEAK  02/2009  .  TOTAL ABDOMINAL HYSTERECTOMY  02/1993    There were no vitals filed for this visit.   05/19/18 1453  Symptoms/Limitations  Subjective Took her 2 days to recover after the evaluation (due to increased back pain). No falls. Had xrays with no significant findings per reports in EPIC and patient reports.  Patient is accompained by: Family member (spouse [name])  Pertinent History Parkinsonism, afib, CHF, DJD, stenosis  Limitations Walking;Standing;House hold activities  How long can you stand comfortably? 3-4 mins   How long can you walk comfortably? 20-30 mins (able to do grocery shopping)  Patient Stated Goals "to decrease this back pain"   Pain Assessment  Currently in Pain? Yes  Pain Score 6  Pain Location Back  Pain Orientation Lower  Pain Descriptors / Indicators Sharp  Pain Type Acute pain  Pain Onset More than a month ago  Pain Frequency Constant  Aggravating Factors  laying down  Pain Relieving Factors seating in recliner; has not used heat or ice "in a while"      05/19/18 1500  ROM / Strength  AROM / PROM / Strength AROM  AROM  Overall AROM Comments below norms. use of dual inclinometry to obtain measurements in standing. only within pain free ranges.   AROM Assessment Site Lumbar  Lumbar Flexion 25  Lumbar Extension 15  Lumbar - Right Side Bend 20  Lumbar - Left  Side Bend 14   issued the following to HEP today. Min guard assist with cues on posture and ex form.   Access Code: [email or web address]  URL: [email or web address]  Date: 05/19/2018  Prepared by: Willow Ora   Exercises  seated hamstring stretch - 3 reps - 1 sets - 15-20 hold - 1x daily - 5x weekly  Seated Pelvic Tilts - 10 reps - 1 sets - 1x daily - 5x weekly  Seated Knee Lifts with Resistance - 10 reps - 1 sets - 1x daily - 5x weekly       05/19/18 2050  PT Education  Education Details results of lumbar range of motion measurements; initial HEP  Person(s) Educated Patient;Spouse  Methods  Explanation;Demonstration;Verbal cues;Handout;Tactile cues  Comprehension Verbalized understanding;Returned demonstration;Verbal cues required;Tactile cues required;Need further instruction        PT Short Term Goals - 05/08/18 1228      PT SHORT TERM GOAL #1   Title  Independent with HEP in order to indicate improved functional mobility and decreased pain.  (Target Date: 06/07/2018)    Time  4    Period  Weeks    Status  New    Target Date  06/07/18      PT SHORT TERM GOAL #2   Title  Will provide pt with Modified Oswestry and write LTG as appropriate.     Time  4    Period  Weeks    Status  New      PT SHORT TERM GOAL #3   Title  Will assess lumbar ROM and update goal/add LTG as able    Time  4    Period  Weeks    Status  New      PT SHORT TERM GOAL #4   Title  Pt will report no more than 6/10 pain when performing ADLs in order to indicate pain is decreased limiting factor.     Time  4    Period  Weeks    Status  New      PT SHORT TERM GOAL #5   Title  Pt will perform all aspects of bed mobility at mod I level with no more than 2 point increase in pain in order to indicate improved ability to move in bed at home.     Time  4    Period  Days    Status  New        PT Long Term Goals - 05/08/18 1231      PT LONG TERM GOAL #1   Title  Pt will be IND with final HEP in order to indicate improved functional mobility and decreased pain.  (Target Date: 07/07/2018)    Time  8    Period  Weeks    Status  New    Target Date  07/07/18      PT LONG TERM GOAL #2   Title  Pt will report no more than 5/10 pain with ADLs in order to indicate improved functional mobility.     Time  8    Period  Weeks    Status  New      PT LONG TERM GOAL #3   Title  Pt will verbal adherence to walking/community exercise program to maintain gains made in therapy.     Time  8    Period  Weeks    Status  New         05/19/18 1457  Plan  Clinical Impression Statement Today's  skilled  session focused on initial measurements of pt's lumbar range of motion and on initiation of HEP. Pt needed rest breaks due to pain during session. No significant increase reported at end of session. The pt is progressing toward goals and should benefit from continued PT to progress toward unmet goals.   Pt will benefit from skilled therapeutic intervention in order to improve on the following deficits Decreased activity tolerance;Decreased balance;Decreased endurance;Decreased mobility;Decreased range of motion;Decreased strength;Difficulty walking;Hypomobility;Impaired perceived functional ability;Impaired flexibility;Impaired sensation;Postural dysfunction;Pain  Rehab Potential Good  Clinical Impairments Affecting Rehab Potential co-morbidities, number of previous back surgeries, high co-pay  PT Frequency 2x / week (this is recommendation, however they are only willing to do 1x/wk 2/2 finances)  PT Duration 8 weeks  PT Treatment/Interventions ADLs/Self Care Home Management;Aquatic Therapy;Moist Heat;Electrical Stimulation;Ultrasound;DME Instruction;Gait training;Stair training;Functional mobility training;Therapeutic activities;Therapeutic exercise;Balance training;Neuromuscular re-education;Patient/family education;Orthotic Fit/Training;Manual techniques;Passive range of motion;Dry needling;Taping  PT Next Visit Plan work on bed mobility with mat table at height of pt's bed at home; continue to work on core strengthening/LE strengthening  Consulted and Agree with Plan of Care Patient;Family member/caregiver  Family Member Consulted Husband [name]        Patient will benefit from skilled therapeutic intervention in order to improve the following deficits and impairments:  Decreased activity tolerance, Decreased balance, Decreased endurance, Decreased mobility, Decreased range of motion, Decreased strength, Difficulty walking, Hypomobility, Impaired perceived functional ability, Impaired  flexibility, Impaired sensation, Postural dysfunction, Pain  Visit Diagnosis: Muscle weakness (generalized)  Abnormal posture  Acute bilateral low back pain, unspecified whether sciatica present  Other abnormalities of gait and mobility     Problem List Patient Active Problem List   Diagnosis Date Noted  . Acute bronchitis due to infection 05/04/2017  . Cough 04/21/2016  . Medication management 04/18/2015  . Parkinsonism ([place]) 05/15/2014  . Chronic anticoagulation -warfarin therapy 02/03/2014  . Atrial fibrillation with RVR ([place]) 01/31/2014  . Gait instability 11/16/2013  . Encounter for therapeutic drug monitoring 07/20/2013  . Edema 03/03/2010  . ATRIAL FIBRILLATION 04/23/2008  . Elevated lipids 01/31/2008  . Obstructive sleep apnea 01/31/2008  . GLAUCOMA 01/30/2008  . Seasonal and perennial allergic rhinitis 01/30/2008  . DEGENERATIVE JOINT DISEASE 01/30/2008    Willow Ora 05/21/2018, 8:38 PM  [facility] [address] [place], [place], [address] Phone: [phone]   Fax:  [phone]  Name: [name] MRN: [id number] Date of Birth: 1940/11/01

## 2018-05-24 ENCOUNTER — Ambulatory Visit: Payer: Medicare Other | Admitting: *Deleted

## 2018-05-24 DIAGNOSIS — Z5181 Encounter for therapeutic drug level monitoring: Secondary | ICD-10-CM | POA: Diagnosis not present

## 2018-05-24 DIAGNOSIS — I4891 Unspecified atrial fibrillation: Secondary | ICD-10-CM | POA: Diagnosis not present

## 2018-05-24 LAB — POCT INR: INR: 3.5 — AB (ref 2.0–3.0)

## 2018-05-24 NOTE — Patient Instructions (Addendum)
Description   Skip your Coumadin tomorrow, then start taking 1/2 tablet daily except No Coumadin on Thursdays. When you get close to using all your 5mg  tablets let us know so we can send in a new tablet strength. Recheck INR in 2 weeks. Call if placed on any new medications  [phone]

## 2018-05-26 ENCOUNTER — Ambulatory Visit: Payer: Medicare Other

## 2018-05-26 DIAGNOSIS — R2681 Unsteadiness on feet: Secondary | ICD-10-CM | POA: Diagnosis not present

## 2018-05-26 DIAGNOSIS — M545 Low back pain, unspecified: Secondary | ICD-10-CM

## 2018-05-26 DIAGNOSIS — R2689 Other abnormalities of gait and mobility: Secondary | ICD-10-CM | POA: Diagnosis not present

## 2018-05-26 DIAGNOSIS — R293 Abnormal posture: Secondary | ICD-10-CM

## 2018-05-26 DIAGNOSIS — M6281 Muscle weakness (generalized): Secondary | ICD-10-CM | POA: Diagnosis not present

## 2018-05-27 NOTE — Therapy (Signed)
[facility] [address] [place], [place], [address] Phone: [phone]   Fax:  [phone]  Physical Therapy Treatment  Patient Details  Name: [name] MRN: [id number] Date of Birth: May 17, 1941 Referring Provider (PT): [name], MD   Encounter Date: 05/26/2018  PT End of Session - 05/26/18 1455    Visit Number  3    Number of Visits  17   recommended 2x/wk, they plan to only do 1x/wk due to finances   Date for PT Re-Evaluation  08/06/18   only plan to see for 60 days   Authorization Type  [employer]-10th visit progress note    PT Start Time  1446    PT Stop Time  1534    PT Time Calculation (min)  48 min    Equipment Utilized During Treatment  Gait belt    Activity Tolerance  Patient limited by pain;Patient tolerated treatment well    Behavior During Therapy  [facility] for tasks assessed/performed       Past Medical History:  Diagnosis Date  . Allergic rhinitis   . Arthritis    "all over"  . CHF (congestive heart failure) ([place])   . Chronic back pain   . DJD (degenerative joint disease)   . GERD (gastroesophageal reflux disease)   . Hyperlipidemia   . Hypertension   . Lumbar stenosis   . OSA (obstructive sleep apnea)    "couldn't handle CPAP; use mouth guard some; not all the time" (01/06/2014)  . PAF (paroxysmal atrial fibrillation) ([place])   . Scoliosis    with radiculopathy L2-S1 with prior surgery  . Small bowel obstruction (HCC)    versus ileus after last bck surgery  . Spondylosis     Past Surgical History:  Procedure Laterality Date  . BACK SURGERY    . CATARACT EXTRACTION W/ INTRAOCULAR LENS  IMPLANT, BILATERAL  2011  . COLONOSCOPY  12/09/2004  . LIPOMA EXCISION  1980's   "fatty tumors"  . [place] SURGERY  02/2009   "ruptured disc"  . NASAL SEPTUM SURGERY  80's  . POSTERIOR LUMBAR FUSION  06/2010; 10/2011   "placed screws, rods, spacers both times"  . REPAIR DURAL / CSF LEAK   02/2009  . TOTAL ABDOMINAL HYSTERECTOMY  02/1993    There were no vitals filed for this visit.  Subjective Assessment - 05/26/18 1452    Subjective  For 3 days pt has been experiencing excruciating back pain, yesterday spouse had to help her out of chair, today hasn't been quite as bad. No falls to report.     Patient is accompained by:  Family member    Pertinent History  Parkinsonism, afib, CHF, DJD, stenosis    Limitations  Walking;Standing;House hold activities    How long can you stand comfortably?  3-4 mins     How long can you walk comfortably?  20-30 mins (able to do grocery shopping)    Patient Stated Goals  "to decrease this back pain"     Currently in Pain?  Yes    Pain Score  7     Pain Location  Back    Pain Orientation  Mid;Lower    Pain Descriptors / Indicators  Sharp;Shooting    Pain Type  Acute pain    Pain Onset  In the past 7 days    Pain Frequency  Intermittent        [facility] Adult PT Treatment/Exercise - 05/27/18 0001  Bed Mobility   Bed Mobility  Sit to Sidelying Right;Right Sidelying to Sit    Right Sidelying to Sit  Supervision/Verbal cueing    Right Sidelying to Sit Details (indicate cue type and reason)  Pt grimacing due to pain during transfer, VC's for breathing, to relax and make smooth transitions, technique.     Sit to Sidelying Right  Supervision/Verbal cueing      Ambulation/Gait   Ambulation/Gait  Yes    Ambulation/Gait Assistance  5: Supervision    Ambulation/Gait Assistance Details  Pt C/O of back pain initially with decreased pain by end of gait task, pt stated she believed that it loosened her up and she felt less pain/tension.     Ambulation Distance (Feet)  115 Feet    Assistive device  Small based quad cane    Ambulation Surface  Level;Indoor      Exercises   Exercises  Knee/Hip      Knee/Hip Exercises: Seated   Long Arc Quad  AROM;Strengthening;Both;2 sets;10 reps    Long Arc Quad Weight  2 lbs.    Marching   AROM;Strengthening;Both;2 sets;10 reps    Marching Weights  2 lbs.    Abduction/Adduction   AROM;Strengthening;Both;2 sets;10 reps;Other (comment)   with RTB for abd/ball for add       PT Short Term Goals - 05/08/18 1228      PT SHORT TERM GOAL #1   Title  Independent with HEP in order to indicate improved functional mobility and decreased pain.  (Target Date: 06/07/2018)    Time  4    Period  Weeks    Status  New    Target Date  06/07/18      PT SHORT TERM GOAL #2   Title  Will provide pt with Modified Oswestry and write LTG as appropriate.     Time  4    Period  Weeks    Status  New      PT SHORT TERM GOAL #3   Title  Will assess lumbar ROM and update goal/add LTG as able    Time  4    Period  Weeks    Status  New      PT SHORT TERM GOAL #4   Title  Pt will report no more than 6/10 pain when performing ADLs in order to indicate pain is decreased limiting factor.     Time  4    Period  Weeks    Status  New      PT SHORT TERM GOAL #5   Title  Pt will perform all aspects of bed mobility at mod I level with no more than 2 point increase in pain in order to indicate improved ability to move in bed at home.     Time  4    Period  Days    Status  New        PT Long Term Goals - 05/08/18 1231      PT LONG TERM GOAL #1   Title  Pt will be IND with final HEP in order to indicate improved functional mobility and decreased pain.  (Target Date: 07/07/2018)    Time  8    Period  Weeks    Status  New    Target Date  07/07/18      PT LONG TERM GOAL #2   Title  Pt will report no more than 5/10 pain with ADLs in order to indicate improved functional mobility.  Time  8    Period  Weeks    Status  New      PT LONG TERM GOAL #3   Title  Pt will verbal adherence to walking/community exercise program to maintain gains made in therapy.     Time  8    Period  Weeks    Status  New            Plan - 05/27/18 1107    Clinical Impression Statement  Todays skilled session  focused on bed mobility, gait with SBQC, and BLE strengthening. Pt continues to C/O back pain during task with some relief towards end of gait training. Pt should benefit from continued PT sessions to progress towards goals.     Rehab Potential  Good    Clinical Impairments Affecting Rehab Potential  co-morbidities, number of previous back surgeries, high co-pay    PT Frequency  2x / week   this is recommendation, however they are only willing to do 1x/wk 2/2 finances   PT Duration  8 weeks    PT Treatment/Interventions  ADLs/Self Care Home Management;Aquatic Therapy;Moist Heat;Electrical Stimulation;Ultrasound;DME Instruction;Gait training;Stair training;Functional mobility training;Therapeutic activities;Therapeutic exercise;Balance training;Neuromuscular re-education;Patient/family education;Orthotic Fit/Training;Manual techniques;Passive range of motion;Dry needling;Taping    PT Next Visit Plan  work on bed mobility with mat table at height of pt's bed at home; continue to work on core strengthening/LE strengthening, gait training.     Consulted and Agree with Plan of Care  Patient;Family member/caregiver    Family Member Consulted  Husband [name]       Patient will benefit from skilled therapeutic intervention in order to improve the following deficits and impairments:  Decreased activity tolerance, Decreased balance, Decreased endurance, Decreased mobility, Decreased range of motion, Decreased strength, Difficulty walking, Hypomobility, Impaired perceived functional ability, Impaired flexibility, Impaired sensation, Postural dysfunction, Pain  Visit Diagnosis: Muscle weakness (generalized)  Abnormal posture  Acute bilateral low back pain, unspecified whether sciatica present  Other abnormalities of gait and mobility  Unsteadiness on feet     Problem List Patient Active Problem List   Diagnosis Date Noted  . Acute bronchitis due to infection 05/04/2017  . Cough 04/21/2016  .  Medication management 04/18/2015  . Parkinsonism ([place]) 05/15/2014  . Chronic anticoagulation -warfarin therapy 02/03/2014  . Atrial fibrillation with RVR ([place]) 01/31/2014  . Gait instability 11/16/2013  . Encounter for therapeutic drug monitoring 07/20/2013  . Edema 03/03/2010  . ATRIAL FIBRILLATION 04/23/2008  . Elevated lipids 01/31/2008  . Obstructive sleep apnea 01/31/2008  . GLAUCOMA 01/30/2008  . Seasonal and perennial allergic rhinitis 01/30/2008  . DEGENERATIVE JOINT DISEASE 01/30/2008   Chassity Felts, PTA  Chassity A Felts 05/27/2018, 11:12 AM  [facility] [address] [place], [place], [address] Phone: [phone]   Fax:  [phone]  Name: [name] MRN: [id number] Date of Birth: 1941/03/10

## 2018-06-08 ENCOUNTER — Encounter: Payer: Self-pay | Admitting: Physical Therapy

## 2018-06-08 ENCOUNTER — Ambulatory Visit: Payer: Medicare Other | Attending: Family Medicine | Admitting: Physical Therapy

## 2018-06-08 DIAGNOSIS — M545 Low back pain, unspecified: Secondary | ICD-10-CM

## 2018-06-08 DIAGNOSIS — M6281 Muscle weakness (generalized): Secondary | ICD-10-CM

## 2018-06-08 DIAGNOSIS — R2689 Other abnormalities of gait and mobility: Secondary | ICD-10-CM | POA: Diagnosis not present

## 2018-06-08 DIAGNOSIS — R293 Abnormal posture: Secondary | ICD-10-CM | POA: Diagnosis not present

## 2018-06-11 NOTE — Therapy (Signed)
[facility] [address] [place], [place], [address] Phone: [phone]   Fax:  [phone]  Physical Therapy Treatment  Patient Details  Name: [name] MRN: [id number] Date of Birth: 1941/04/05 Referring Provider (PT): [name], MD   Encounter Date: 06/08/2018     06/08/18 1326  PT Visits / Re-Eval  Visit Number 4  Number of Visits 17 (recommended 2x/wk, they plan to only do 1x/wk due to finances)  Date for PT Re-Evaluation 08/06/18 (only plan to see for 60 days)  Authorization  Authorization Type [employer]-10th visit progress note  PT Time Calculation  PT Start Time 9379  PT Stop Time 1400  PT Time Calculation (min) 43 min  PT - End of Session  Equipment Utilized During Treatment Gait belt  Activity Tolerance Patient limited by pain;Patient tolerated treatment well  Behavior During Therapy [facility] for tasks assessed/performed    Past Medical History:  Diagnosis Date  . Allergic rhinitis   . Arthritis    "all over"  . CHF (congestive heart failure) ([place])   . Chronic back pain   . DJD (degenerative joint disease)   . GERD (gastroesophageal reflux disease)   . Hyperlipidemia   . Hypertension   . Lumbar stenosis   . OSA (obstructive sleep apnea)    "couldn't handle CPAP; use mouth guard some; not all the time" (01/06/2014)  . PAF (paroxysmal atrial fibrillation) ([place])   . Scoliosis    with radiculopathy L2-S1 with prior surgery  . Small bowel obstruction (HCC)    versus ileus after last bck surgery  . Spondylosis     Past Surgical History:  Procedure Laterality Date  . BACK SURGERY    . CATARACT EXTRACTION W/ INTRAOCULAR LENS  IMPLANT, BILATERAL  2011  . COLONOSCOPY  12/09/2004  . LIPOMA EXCISION  1980's   "fatty tumors"  . [place] SURGERY  02/2009   "ruptured disc"  . NASAL SEPTUM SURGERY  80's  . POSTERIOR LUMBAR FUSION  06/2010; 10/2011   "placed screws, rods, spacers both  times"  . REPAIR DURAL / CSF LEAK  02/2009  . TOTAL ABDOMINAL HYSTERECTOMY  02/1993    There were no vitals filed for this visit.     06/08/18 1321  Symptoms/Limitations  Subjective Feeling "lousey" today. Has been helping to put away decorations, back pain is 8/10. Also reports getting "bad" abdominal pain after she eats (~50% of the time). Has made her MD aware. Has been referred to a Gastrointerologist who did not have an answer.   Patient is accompained by: Family member  Pertinent History Parkinsonism, afib, CHF, DJD, stenosis  Limitations Walking;Standing;House hold activities  How long can you stand comfortably? 3-4 mins   How long can you walk comfortably? 20-30 mins (able to do grocery shopping)  Patient Stated Goals "to decrease this back pain"   Pain Assessment  Pain Score 8  Pain Location Back  Pain Orientation Mid;Lower  Pain Descriptors / Indicators Sharp;Shooting  Pain Type Acute pain;Chronic pain  Pain Onset 1 to 4 weeks ago  Pain Frequency Intermittent  Aggravating Factors  laying down  Pain Relieving Factors sitting in recliner      06/08/18 1330  Ambulation/Gait  Ambulation/Gait Yes  Ambulation/Gait Assistance 5: Supervision  Ambulation/Gait Assistance Details cues on posture and rollator position with gait  Ambulation Distance (Feet) 100 Feet (x2)  Assistive device Rollator  Gait Pattern Step-through pattern;Decreased stride length;Shuffle;Trunk flexed  Ambulation Surface Level;Indoor  Exercises  Exercises Other Exercises  Other Exercises  seated in chair away from back support with red theraband: shoulder rows x 10 reps with cues for scapular retraction, shoulder horizontal abduction x 10 reps, and alternating diagonals x 5 reps each side. multmodal cues needed for correct posture and ex form.   Knee/Hip Exercises: Aerobic  Nustep with UE/LE's at level 4.0 for 8 minutes with goal >/=45 steps per minute for strengthening, concurrent with moist hot pack to  low back.   Knee/Hip Exercises: Seated  Long Arc Quad Strengthening;Both;1 set;10 reps;Limitations;Weights;AROM  Long Arc Quad Weight 2 lbs.  Marching AROM;Strengthening;Both;1 set;10 reps;Limitations;Weights  Marching Weights 2 lbs.  Long [employer] Limitations cues for slow, controlled movements  Marching Limitations use of PTA's hands for target with cues for slow,controlled lowering  Modalities  Modalities Moist Heat  Moist Heat Therapy  Number Minutes Moist Heat 8 Minutes  Moist Heat Location Lumbar Spine (concurrent with Nustep)         PT Short Term Goals - 06/08/18 1340      PT SHORT TERM GOAL #1   Title  Independent with HEP in order to indicate improved functional mobility and decreased pain.  (Target Date: 06/07/2018)    Baseline  06/08/18: met with current HEP    Status  Achieved      PT SHORT TERM GOAL #2   Title  Will provide pt with Modified Oswestry and write LTG as appropriate.     Baseline  06/08/18: was done at visit 2, PT to set LTG    Status  Achieved      PT SHORT TERM GOAL #3   Title  Will assess lumbar ROM and update goal/add LTG as able    Baseline  06/08/18: was done at visit 2, PT to set goals.     Status  Achieved      PT SHORT TERM GOAL #4   Title  Pt will report no more than 6/10 pain when performing ADLs in order to indicate pain is decreased limiting factor.     Baseline  06/08/18: continues to report up to 8/10 pain with ADLs    Status  Not Met      PT SHORT TERM GOAL #5   Title  Pt will perform all aspects of bed mobility at mod I level with no more than 2 point increase in pain in order to indicate improved ability to move in bed at home.     Baseline  06/08/18: continues to need spouse assist with increased pain with getting out of bed. discussed getting a bed rail to assist pt with sitting up from side lying on bed.     Status  Not Met        PT Long Term Goals - 06/08/18 1338      PT LONG TERM GOAL #1   Title  Pt will be IND with final HEP  in order to indicate improved functional mobility and decreased pain.  (Target Date: 07/07/2018)    Status  On-going      PT LONG TERM GOAL #2   Title  Pt will report no more than 5/10 pain with ADLs in order to indicate improved functional mobility.     Time  8    Period  Weeks    Status  On-going      PT LONG TERM GOAL #3   Title  Pt will verbal adherence to walking/community exercise program to maintain gains made in therapy.  Time  8    Period  Weeks    Status  On-going         06/08/18 1326  Plan  Clinical Impression Statement Today's skilled session continued to focus on gentle strengthening, decreased pain and activity tolerance with pt reporting decreased pain after session. Pt also met 3/5 STGs today. The pt is progressing toward goals and should benefit from continued PT to progress toward unmet goals.   Pt will benefit from skilled therapeutic intervention in order to improve on the following deficits Decreased activity tolerance;Decreased balance;Decreased endurance;Decreased mobility;Decreased range of motion;Decreased strength;Difficulty walking;Hypomobility;Impaired perceived functional ability;Impaired flexibility;Impaired sensation;Postural dysfunction;Pain  Rehab Potential Good  Clinical Impairments Affecting Rehab Potential co-morbidities, number of previous back surgeries, high co-pay  PT Frequency 2x / week (this is recommendation, however they are only willing to do 1x/wk 2/2 finances)  PT Duration 8 weeks  PT Treatment/Interventions ADLs/Self Care Home Management;Aquatic Therapy;Moist Heat;Electrical Stimulation;Ultrasound;DME Instruction;Gait training;Stair training;Functional mobility training;Therapeutic activities;Therapeutic exercise;Balance training;Neuromuscular re-education;Patient/family education;Orthotic Fit/Training;Manual techniques;Passive range of motion;Dry needling;Taping  PT Next Visit Plan work on bed mobility with mat table at height of pt's bed  at home; continue to work on core strengthening/LE strengthening, gait training.   Consulted and Agree with Plan of Care Patient;Family member/caregiver  Family Member Consulted Husband [name]          Patient will benefit from skilled therapeutic intervention in order to improve the following deficits and impairments:  Decreased activity tolerance, Decreased balance, Decreased endurance, Decreased mobility, Decreased range of motion, Decreased strength, Difficulty walking, Hypomobility, Impaired perceived functional ability, Impaired flexibility, Impaired sensation, Postural dysfunction, Pain  Visit Diagnosis: Muscle weakness (generalized)  Abnormal posture  Acute bilateral low back pain, unspecified whether sciatica present  Other abnormalities of gait and mobility     Problem List Patient Active Problem List   Diagnosis Date Noted  . Acute bronchitis due to infection 05/04/2017  . Cough 04/21/2016  . Medication management 04/18/2015  . Parkinsonism ([place]) 05/15/2014  . Chronic anticoagulation -warfarin therapy 02/03/2014  . Atrial fibrillation with RVR ([place]) 01/31/2014  . Gait instability 11/16/2013  . Encounter for therapeutic drug monitoring 07/20/2013  . Edema 03/03/2010  . ATRIAL FIBRILLATION 04/23/2008  . Elevated lipids 01/31/2008  . Obstructive sleep apnea 01/31/2008  . GLAUCOMA 01/30/2008  . Seasonal and perennial allergic rhinitis 01/30/2008  . DEGENERATIVE JOINT DISEASE 01/30/2008    Willow Ora, PTA, [facility] [address], [place], [place] [address] [phone] 06/11/18, 8:14 PM   Name: DAUN RENS MRN: [id number] Date of Birth: 04-15-1941

## 2018-06-14 ENCOUNTER — Ambulatory Visit: Payer: Medicare Other | Admitting: *Deleted

## 2018-06-14 DIAGNOSIS — Z5181 Encounter for therapeutic drug level monitoring: Secondary | ICD-10-CM | POA: Diagnosis not present

## 2018-06-14 DIAGNOSIS — I4891 Unspecified atrial fibrillation: Secondary | ICD-10-CM | POA: Diagnosis not present

## 2018-06-14 LAB — POCT INR: INR: 2.8 (ref 2.0–3.0)

## 2018-06-14 MED ORDER — WARFARIN SODIUM 2.5 MG PO TABS: 2.5000 mg | ORAL_TABLET | ORAL | 0 refills | Status: DC

## 2018-06-14 NOTE — Patient Instructions (Signed)
Description   Continue taking 1/2 tablet daily except No Coumadin on Thursdays. New tablet strength has been ordered. (2.5mg ).  Recheck INR in 2 weeks. Call if placed on any new medications  [phone]

## 2018-06-15 ENCOUNTER — Ambulatory Visit: Payer: Medicare Other

## 2018-06-15 DIAGNOSIS — M545 Low back pain, unspecified: Secondary | ICD-10-CM

## 2018-06-15 DIAGNOSIS — M6281 Muscle weakness (generalized): Secondary | ICD-10-CM

## 2018-06-15 DIAGNOSIS — R2689 Other abnormalities of gait and mobility: Secondary | ICD-10-CM | POA: Diagnosis not present

## 2018-06-15 DIAGNOSIS — R293 Abnormal posture: Secondary | ICD-10-CM | POA: Diagnosis not present

## 2018-06-15 NOTE — Therapy (Signed)
[facility] [address] [place], [place], [address] Phone: [phone]   Fax:  [phone]  Physical Therapy Treatment  Patient Details  Name: [name] MRN: [id number] Date of Birth: May 20, 1941 Referring Provider (PT): [name], MD   Encounter Date: 06/15/2018  PT End of Session - 06/15/18 1709    Visit Number  5    Number of Visits  17    Date for PT Re-Evaluation  08/06/18    Authorization Type  [employer]-10th visit progress note    PT Start Time  1451    PT Stop Time  1532    PT Time Calculation (min)  41 min    Equipment Utilized During Treatment  --   min A to S prn   Activity Tolerance  Patient tolerated treatment well    Behavior During Therapy  [facility] for tasks assessed/performed       Past Medical History:  Diagnosis Date  . Allergic rhinitis   . Arthritis    "all over"  . CHF (congestive heart failure) ([place])   . Chronic back pain   . DJD (degenerative joint disease)   . GERD (gastroesophageal reflux disease)   . Hyperlipidemia   . Hypertension   . Lumbar stenosis   . OSA (obstructive sleep apnea)    "couldn't handle CPAP; use mouth guard some; not all the time" (01/06/2014)  . PAF (paroxysmal atrial fibrillation) ([place])   . Scoliosis    with radiculopathy L2-S1 with prior surgery  . Small bowel obstruction (HCC)    versus ileus after last bck surgery  . Spondylosis     Past Surgical History:  Procedure Laterality Date  . BACK SURGERY    . CATARACT EXTRACTION W/ INTRAOCULAR LENS  IMPLANT, BILATERAL  2011  . COLONOSCOPY  12/09/2004  . LIPOMA EXCISION  1980's   "fatty tumors"  . [place] SURGERY  02/2009   "ruptured disc"  . NASAL SEPTUM SURGERY  80's  . POSTERIOR LUMBAR FUSION  06/2010; 10/2011   "placed screws, rods, spacers both times"  . REPAIR DURAL / CSF LEAK  02/2009  . TOTAL ABDOMINAL HYSTERECTOMY  02/1993    There were no vitals filed for this visit.  Subjective  Assessment - 06/15/18 1455    Subjective  Pt reported she has felt terrible since last visit. Pt denied falls since last visit. Pt not sleeping well.     Patient is accompained by:  Family member    Pertinent History  Parkinsonism, afib, CHF, DJD, stenosis    Patient Stated Goals  "to decrease this back pain"     Currently in Pain?  Yes    Pain Score  9     Pain Location  Back    Pain Orientation  Lower    Pain Descriptors / Indicators  Sharp;Shooting   she feels a popping in her back   Pain Type  Chronic pain;Acute pain   acute on chronic   Pain Onset  1 to 4 weeks ago    Pain Frequency  Intermittent    Aggravating Factors   lying down, exercises from last session    Pain Relieving Factors  I'm not sure         Therex:  Access Code: [email or web address]  URL: [email or web address]  Date: 06/15/2018  Prepared by: Geoffry Paradise   Exercises  seated hamstring stretch - 3 reps - 1 sets - 15-20 hold - 1x daily - 5x weekly  Supine  Lower Trunk Rotation - 3 reps - 1 sets - 30 hold - 1x daily - 7x weekly  Supine Lower trunk rotation no hold x15 reps/direction  PT assisted pt with all supine exercises with pt propped on wedge and 3 pillows for comfort. Pt reported decr. LBP to 6/10 after exercises. Handout provided to pt to incorporate into HEP.              [facility] Adult PT Treatment/Exercise - 06/15/18 1505      Transfers   Transfers  Supine to Sit;Sit to Supine    Sit to Stand  --    Supine to Sit  4: Min guard;With upper extremity assist;4: Min assist;From bed   with pt propped on pillows and wedge   Supine to Sit Details (indicate cue type and reason)  Pt required min A to assist 1LE off EOB and to guide trunk into seated position.     Sit to Supine  4: Min guard;5: Supervision;With upper extremity assist;To bed    Sit to Supine Details   Pt required min guard to S to ensure safety. Cues for hand placement and log roll to decr. back pain and to keep knees flexed to  support low back once in supine.       Knee/Hip Exercises: Aerobic   Other Aerobic  SciFit: with B LEs (pt reported pulling in back with BUEs on handles) Cues to improve speed to >40RPMs. 10 minutes to improve endurance, strength, and reduce pain. 0.29 miles. Pt able to add BUEs for last 3 minutes. Pt reported back pain decr. to 7-8/10 after bike.              PT Education - 06/15/18 1708    Education Details  PT reviewed HEP and added supine therex.     Person(s) Educated  Patient;Spouse    Methods  Explanation;Demonstration;Tactile cues;Verbal cues;Handout    Comprehension  Returned demonstration;Verbalized understanding       PT Short Term Goals - 06/08/18 1340      PT SHORT TERM GOAL #1   Title  Independent with HEP in order to indicate improved functional mobility and decreased pain.  (Target Date: 06/07/2018)    Baseline  06/08/18: met with current HEP    Status  Achieved      PT SHORT TERM GOAL #2   Title  Will provide pt with Modified Oswestry and write LTG as appropriate.     Baseline  06/08/18: was done at visit 2, PT to set LTG    Status  Achieved      PT SHORT TERM GOAL #3   Title  Will assess lumbar ROM and update goal/add LTG as able    Baseline  06/08/18: was done at visit 2, PT to set goals.     Status  Achieved      PT SHORT TERM GOAL #4   Title  Pt will report no more than 6/10 pain when performing ADLs in order to indicate pain is decreased limiting factor.     Baseline  06/08/18: continues to report up to 8/10 pain with ADLs    Status  Not Met      PT SHORT TERM GOAL #5   Title  Pt will perform all aspects of bed mobility at mod I level with no more than 2 point increase in pain in order to indicate improved ability to move in bed at home.     Baseline  06/08/18: continues to need spouse  assist with increased pain with getting out of bed. discussed getting a bed rail to assist pt with sitting up from side lying on bed.     Status  Not Met        PT Long Term  Goals - 06/08/18 1338      PT LONG TERM GOAL #1   Title  Pt will be IND with final HEP in order to indicate improved functional mobility and decreased pain.  (Target Date: 07/07/2018)    Status  On-going      PT LONG TERM GOAL #2   Title  Pt will report no more than 5/10 pain with ADLs in order to indicate improved functional mobility.     Time  8    Period  Weeks    Status  On-going      PT LONG TERM GOAL #3   Title  Pt will verbal adherence to walking/community exercise program to maintain gains made in therapy.     Time  8    Period  Weeks    Status  On-going            Plan - 06/15/18 1659    Clinical Impression Statement  Today's session limited by pt's pain at beginning of session, however, pt demonstrated progress as she reported pain reduction from 9/10 to 6/10 at end of session. Pt is now reporting "popping" in her back during certain movements and has not followed up with surgeon as "he has left". Therefore, PT will f/u with primary PT regarding pt's incr. in pain intensity. Incr. time required in between all exercises and activities 2/2 LBP. Pt would continue to benefit from skilled PT to reduce pain and improve safety during functional mobility.    Rehab Potential  Good    Clinical Impairments Affecting Rehab Potential  co-morbidities, number of previous back surgeries, high co-pay    PT Frequency  2x / week   this is recommendation, however they are only willing to do 1x/wk 2/2 finances   PT Duration  8 weeks    PT Treatment/Interventions  ADLs/Self Care Home Management;Aquatic Therapy;Moist Heat;Electrical Stimulation;Ultrasound;DME Instruction;Gait training;Stair training;Functional mobility training;Therapeutic activities;Therapeutic exercise;Balance training;Neuromuscular re-education;Patient/family education;Orthotic Fit/Training;Manual techniques;Passive range of motion;Dry needling;Taping    PT Next Visit Plan  Have pt f/u with MD as necessary re: incr. LBP. work  on bed mobility with mat table at height of pt's bed at home; continue to work on core strengthening/LE strengthening, gait training.     PT Home Exercise Plan  [email or web address]     Consulted and Agree with Plan of Care  Patient;Family member/caregiver    Family Member Consulted  Husband [name]       Patient will benefit from skilled therapeutic intervention in order to improve the following deficits and impairments:  Decreased activity tolerance, Decreased balance, Decreased endurance, Decreased mobility, Decreased range of motion, Decreased strength, Difficulty walking, Hypomobility, Impaired perceived functional ability, Impaired flexibility, Impaired sensation, Postural dysfunction, Pain  Visit Diagnosis: Acute bilateral low back pain, unspecified whether sciatica present  Muscle weakness (generalized)  Abnormal posture     Problem List Patient Active Problem List   Diagnosis Date Noted  . Acute bronchitis due to infection 05/04/2017  . Cough 04/21/2016  . Medication management 04/18/2015  . Parkinsonism ([place]) 05/15/2014  . Chronic anticoagulation -warfarin therapy 02/03/2014  . Atrial fibrillation with RVR ([place]) 01/31/2014  . Gait instability 11/16/2013  . Encounter for therapeutic drug monitoring 07/20/2013  . Edema 03/03/2010  . ATRIAL  FIBRILLATION 04/23/2008  . Elevated lipids 01/31/2008  . Obstructive sleep apnea 01/31/2008  . GLAUCOMA 01/30/2008  . Seasonal and perennial allergic rhinitis 01/30/2008  . DEGENERATIVE JOINT DISEASE 01/30/2008    Miller,Jennifer L 06/15/2018, 5:10 PM  [facility] [address] [place], [place], [address] Phone: [phone]   Fax:  [phone]  Name: LANI HAVLIK MRN: [id number] Date of Birth: 1941/02/18  Geoffry Paradise, PT,DPT 06/15/18 5:10 PM Phone: [phone] Fax: [phone]

## 2018-06-15 NOTE — Patient Instructions (Signed)
Access Code: [email or web address]  URL: [email or web address]  Date: 06/15/2018  Prepared by: Geoffry Paradise   Exercises  seated hamstring stretch - 3 reps - 1 sets - 15-20 hold - 1x daily - 5x weekly  Supine Lower Trunk Rotation - 3 reps - 1 sets - 30 hold - 1x daily - 7x weekly  Supine Lower trunk rotation no hold x15 reps/direction

## 2018-06-22 ENCOUNTER — Ambulatory Visit: Payer: Medicare Other | Admitting: Physical Therapy

## 2018-06-22 DIAGNOSIS — R293 Abnormal posture: Secondary | ICD-10-CM

## 2018-06-22 DIAGNOSIS — M545 Low back pain, unspecified: Secondary | ICD-10-CM

## 2018-06-22 DIAGNOSIS — R2689 Other abnormalities of gait and mobility: Secondary | ICD-10-CM

## 2018-06-22 DIAGNOSIS — M6281 Muscle weakness (generalized): Secondary | ICD-10-CM

## 2018-06-22 NOTE — Therapy (Addendum)
[facility] [address] [place], [place], [address] Phone: [phone]   Fax:  [phone]  Physical Therapy Treatment and D/C Summary  Patient Details  Name: [name] MRN: [id number] Date of Birth: April 26, 1941 Referring Provider (PT): [name], MD   Encounter Date: 06/22/2018  PT End of Session - 06/22/18 1405    Visit Number  6    Number of Visits  17    Date for PT Re-Evaluation  08/06/18    Authorization Type  [employer]-10th visit progress note    PT Start Time  1403    PT Stop Time  1445    PT Time Calculation (min)  42 min    Equipment Utilized During Treatment  --    Activity Tolerance  Patient tolerated treatment well    Behavior During Therapy  [facility] for tasks assessed/performed       Past Medical History:  Diagnosis Date  . Allergic rhinitis   . Arthritis    "all over"  . CHF (congestive heart failure) ([place])   . Chronic back pain   . DJD (degenerative joint disease)   . GERD (gastroesophageal reflux disease)   . Hyperlipidemia   . Hypertension   . Lumbar stenosis   . OSA (obstructive sleep apnea)    "couldn't handle CPAP; use mouth guard some; not all the time" (01/06/2014)  . PAF (paroxysmal atrial fibrillation) ([place])   . Scoliosis    with radiculopathy L2-S1 with prior surgery  . Small bowel obstruction (HCC)    versus ileus after last bck surgery  . Spondylosis     Past Surgical History:  Procedure Laterality Date  . BACK SURGERY    . CATARACT EXTRACTION W/ INTRAOCULAR LENS  IMPLANT, BILATERAL  2011  . COLONOSCOPY  12/09/2004  . LIPOMA EXCISION  1980's   "fatty tumors"  . [place] SURGERY  02/2009   "ruptured disc"  . NASAL SEPTUM SURGERY  80's  . POSTERIOR LUMBAR FUSION  06/2010; 10/2011   "placed screws, rods, spacers both times"  . REPAIR DURAL / CSF LEAK  02/2009  . TOTAL ABDOMINAL HYSTERECTOMY  02/1993    There were no vitals filed for this visit.  Subjective  Assessment - 06/22/18 1406    Patient is accompained by:  Family member    Pertinent History  Parkinsonism, afib, CHF, DJD, stenosis    Limitations  Walking;Standing;House hold activities    How long can you stand comfortably?  3-4 mins     How long can you walk comfortably?  20-30 mins (able to do grocery shopping)    Patient Stated Goals  "to decrease this back pain"     Currently in Pain?  Yes    Pain Score  10-Worst pain ever   "11/10"   Pain Location  Back    Pain Orientation  Lower    Pain Descriptors / Indicators  Shooting;Sharp   reports some popping as well   Pain Type  Acute pain;Chronic pain   acute on chronic   Pain Onset  1 to 4 weeks ago    Pain Frequency  Intermittent    Aggravating Factors   lying down    Pain Relieving Factors  not sure at this time            [facility] Adult PT Treatment/Exercise - 06/22/18 1414      Ambulation/Gait   Ambulation/Gait  Yes    Ambulation/Gait Assistance  5: Supervision  Ambulation/Gait Assistance Details  cues on posture and to stay closer to rollator with gait    Ambulation Distance (Feet)  100 Feet   x2, plus around gym with activity   Assistive device  Rollator    Gait Pattern  Step-through pattern;Decreased stride length;Shuffle;Trunk flexed    Ambulation Surface  Level;Indoor      Exercises   Exercises  Other Exercises    Other Exercises   seated in chiar with back supported: with red band pt performed 10 reps of rows, then 10 reps of bicep curls each arm separately,       Knee/Hip Exercises: Aerobic   Nustep  with UE/LE's at level 4.0 for 6.5, then level 3 for additional 3.5  minutes with goal >/=45 steps per minute for strengthening, concurrent with moist hot pack to low back.       Knee/Hip Exercises: Seated   Long Arc Quad  AROM;Strengthening;Both;2 sets;10 reps;Limitations    Long Arc Quad Weight  2 lbs.    Long [employer] Limitations  cues for slow, controlled movements    Marching  AROM;Strengthening;Both;1  set;10 reps;Limitations    Marching Limitations  use of PTA's hands for target with cues for slow,controlled lowering    Marching Weights  2 lbs.           PT Short Term Goals - 06/08/18 1340      PT SHORT TERM GOAL #1   Title  Independent with HEP in order to indicate improved functional mobility and decreased pain.  (Target Date: 06/07/2018)    Baseline  06/08/18: met with current HEP    Status  Achieved      PT SHORT TERM GOAL #2   Title  Will provide pt with Modified Oswestry and write LTG as appropriate.     Baseline  06/08/18: was done at visit 2, PT to set LTG    Status  Achieved      PT SHORT TERM GOAL #3   Title  Will assess lumbar ROM and update goal/add LTG as able    Baseline  06/08/18: was done at visit 2, PT to set goals.     Status  Achieved      PT SHORT TERM GOAL #4   Title  Pt will report no more than 6/10 pain when performing ADLs in order to indicate pain is decreased limiting factor.     Baseline  06/08/18: continues to report up to 8/10 pain with ADLs    Status  Not Met      PT SHORT TERM GOAL #5   Title  Pt will perform all aspects of bed mobility at mod I level with no more than 2 point increase in pain in order to indicate improved ability to move in bed at home.     Baseline  06/08/18: continues to need spouse assist with increased pain with getting out of bed. discussed getting a bed rail to assist pt with sitting up from side lying on bed.     Status  Not Met        PT Long Term Goals - 06/22/18 1409      PT LONG TERM GOAL #1   Title  Pt will be IND with final HEP in order to indicate improved functional mobility and decreased pain.  (Target Date: 07/07/2018)    Status  Achieved      PT LONG TERM GOAL #2   Title  Pt will report no more than 5/10 pain  with ADLs in order to indicate improved functional mobility.     Time  8    Period  Weeks    Status  Not Met      PT LONG TERM GOAL #3   Title  Pt will verbal adherence to walking/community exercise  program to maintain gains made in therapy.     Time  8    Period  Weeks    Status  Achieved            Plan - 06/22/18 1406    Clinical Impression Statement  Pt has met 2/3 LTGs. She is able to verbalize importance of exercise and a walking program. She has continued to report increased pain since starting PT. Primary PT discussed this with PT from last session and this PTA, it was decided for the pt to follow up with her MD and stop therapy at this time. Pt and spouse verbalized understanding.     Rehab Potential  Good    Clinical Impairments Affecting Rehab Potential  co-morbidities, number of previous back surgeries, high co-pay    PT Frequency  2x / week   this is recommendation, however they are only willing to do 1x/wk 2/2 finances   PT Duration  8 weeks    PT Treatment/Interventions  ADLs/Self Care Home Management;Aquatic Therapy;Moist Heat;Electrical Stimulation;Ultrasound;DME Instruction;Gait training;Stair training;Functional mobility training;Therapeutic activities;Therapeutic exercise;Balance training;Neuromuscular re-education;Patient/family education;Orthotic Fit/Training;Manual techniques;Passive range of motion;Dry needling;Taping    PT Next Visit Plan  discharge today with pt to follow up with MD due to increased pain with therapy.    PT Home Exercise Plan  [email or web address]     Consulted and Agree with Plan of Care  Patient;Family member/caregiver    Family Member Consulted  Husband [name]       Patient will benefit from skilled therapeutic intervention in order to improve the following deficits and impairments:  Decreased activity tolerance, Decreased balance, Decreased endurance, Decreased mobility, Decreased range of motion, Decreased strength, Difficulty walking, Hypomobility, Impaired perceived functional ability, Impaired flexibility, Impaired sensation, Postural dysfunction, Pain  Visit Diagnosis: Acute bilateral low back pain, unspecified whether sciatica  present  Muscle weakness (generalized)  Abnormal posture  Other abnormalities of gait and mobility    PHYSICAL THERAPY DISCHARGE SUMMARY  Visits from Start of Care: 6  Current functional level related to goals / functional outcomes: See goals above   Remaining deficits: Pt continues to have increased pain and c/o "popping" in low back.  Pt not making progress with PT in regards to mobility and pain relief.  Encouraged/recommended pt follow up with PCP to get new neurosurgeon referral based on pt reporting her surgeon is no longer with practice.     Education / Equipment: Unable to tolerate attempted exercises given in therapy.   Plan: Patient agrees to discharge.  Patient goals were not met. Patient is being discharged due to lack of progress.  ?????         D/C Summary completed by:  Cameron Sprang, PT, MPT [facility] [address] [place], [place], [address] Phone: [phone]   Fax:  [phone] 06/23/18, 3:25 PM   Problem List Patient Active Problem List   Diagnosis Date Noted  . Acute bronchitis due to infection 05/04/2017  . Cough 04/21/2016  . Medication management 04/18/2015  . Parkinsonism ([place]) 05/15/2014  . Chronic anticoagulation -warfarin therapy 02/03/2014  . Atrial fibrillation with RVR ([place]) 01/31/2014  . Gait instability 11/16/2013  . Encounter for therapeutic drug  monitoring 07/20/2013  . Edema 03/03/2010  . ATRIAL FIBRILLATION 04/23/2008  . Elevated lipids 01/31/2008  . Obstructive sleep apnea 01/31/2008  . GLAUCOMA 01/30/2008  . Seasonal and perennial allergic rhinitis 01/30/2008  . DEGENERATIVE JOINT DISEASE 01/30/2008    Willow Ora, PTA, [facility] [address], [place], [place] [address] [phone] 06/23/18, 3:01 PM   Name: [name] MRN: [id number] Date of Birth: Mar 07, 1941

## 2018-06-23 DIAGNOSIS — M545 Low back pain: Secondary | ICD-10-CM | POA: Diagnosis not present

## 2018-06-27 ENCOUNTER — Other Ambulatory Visit: Payer: Self-pay | Admitting: Family Medicine

## 2018-06-27 DIAGNOSIS — M545 Low back pain, unspecified: Secondary | ICD-10-CM

## 2018-06-28 ENCOUNTER — Ambulatory Visit: Payer: Medicare Other | Admitting: Pharmacist

## 2018-06-28 DIAGNOSIS — I4891 Unspecified atrial fibrillation: Secondary | ICD-10-CM

## 2018-06-28 DIAGNOSIS — Z5181 Encounter for therapeutic drug level monitoring: Secondary | ICD-10-CM

## 2018-06-28 LAB — POCT INR: INR: 3.6 — AB (ref 2.0–3.0)

## 2018-06-28 NOTE — Patient Instructions (Addendum)
Hold warfarin tomorrow tonight, then start taking 2.5mg  daily except 1.25mg  on Tuesday and Thursdays. New tablet strength(2.5mg ).  Recheck INR in 2 weeks. Call if placed on any new medications  [phone]

## 2018-06-29 ENCOUNTER — Ambulatory Visit: Payer: Medicare Other | Admitting: Rehabilitation

## 2018-06-30 DIAGNOSIS — Z8249 Family history of ischemic heart disease and other diseases of the circulatory system: Secondary | ICD-10-CM

## 2018-06-30 DIAGNOSIS — K8020 Calculus of gallbladder without cholecystitis without obstruction: Secondary | ICD-10-CM | POA: Diagnosis not present

## 2018-06-30 DIAGNOSIS — R6881 Early satiety: Secondary | ICD-10-CM | POA: Diagnosis not present

## 2018-06-30 DIAGNOSIS — G8929 Other chronic pain: Secondary | ICD-10-CM | POA: Diagnosis present

## 2018-06-30 DIAGNOSIS — Z961 Presence of intraocular lens: Secondary | ICD-10-CM | POA: Diagnosis present

## 2018-06-30 DIAGNOSIS — I480 Paroxysmal atrial fibrillation: Secondary | ICD-10-CM | POA: Diagnosis present

## 2018-06-30 DIAGNOSIS — N183 Chronic kidney disease, stage 3 (moderate): Secondary | ICD-10-CM | POA: Diagnosis present

## 2018-06-30 DIAGNOSIS — M1990 Unspecified osteoarthritis, unspecified site: Secondary | ICD-10-CM | POA: Diagnosis present

## 2018-06-30 DIAGNOSIS — M4804 Spinal stenosis, thoracic region: Secondary | ICD-10-CM | POA: Diagnosis present

## 2018-06-30 DIAGNOSIS — Z7901 Long term (current) use of anticoagulants: Secondary | ICD-10-CM

## 2018-06-30 DIAGNOSIS — K219 Gastro-esophageal reflux disease without esophagitis: Secondary | ICD-10-CM | POA: Diagnosis present

## 2018-06-30 DIAGNOSIS — M4645 Discitis, unspecified, thoracolumbar region: Secondary | ICD-10-CM | POA: Diagnosis not present

## 2018-06-30 DIAGNOSIS — Z8261 Family history of arthritis: Secondary | ICD-10-CM

## 2018-06-30 DIAGNOSIS — Z981 Arthrodesis status: Secondary | ICD-10-CM

## 2018-06-30 DIAGNOSIS — M5416 Radiculopathy, lumbar region: Secondary | ICD-10-CM | POA: Diagnosis not present

## 2018-06-30 DIAGNOSIS — G4733 Obstructive sleep apnea (adult) (pediatric): Secondary | ICD-10-CM | POA: Diagnosis not present

## 2018-06-30 DIAGNOSIS — T17308A Unspecified foreign body in larynx causing other injury, initial encounter: Secondary | ICD-10-CM | POA: Diagnosis not present

## 2018-06-30 DIAGNOSIS — Z825 Family history of asthma and other chronic lower respiratory diseases: Secondary | ICD-10-CM | POA: Diagnosis not present

## 2018-06-30 DIAGNOSIS — I5032 Chronic diastolic (congestive) heart failure: Secondary | ICD-10-CM | POA: Diagnosis not present

## 2018-06-30 DIAGNOSIS — E785 Hyperlipidemia, unspecified: Secondary | ICD-10-CM | POA: Diagnosis present

## 2018-06-30 DIAGNOSIS — D696 Thrombocytopenia, unspecified: Secondary | ICD-10-CM | POA: Diagnosis present

## 2018-06-30 DIAGNOSIS — R948 Abnormal results of function studies of other organs and systems: Secondary | ICD-10-CM | POA: Diagnosis not present

## 2018-06-30 DIAGNOSIS — Z6830 Body mass index (BMI) 30.0-30.9, adult: Secondary | ICD-10-CM

## 2018-06-30 DIAGNOSIS — Z888 Allergy status to other drugs, medicaments and biological substances status: Secondary | ICD-10-CM

## 2018-06-30 DIAGNOSIS — M4727 Other spondylosis with radiculopathy, lumbosacral region: Secondary | ICD-10-CM | POA: Diagnosis present

## 2018-06-30 DIAGNOSIS — Z01810 Encounter for preprocedural cardiovascular examination: Secondary | ICD-10-CM | POA: Diagnosis not present

## 2018-06-30 DIAGNOSIS — R443 Hallucinations, unspecified: Secondary | ICD-10-CM | POA: Diagnosis not present

## 2018-06-30 DIAGNOSIS — Z801 Family history of malignant neoplasm of trachea, bronchus and lung: Secondary | ICD-10-CM | POA: Diagnosis not present

## 2018-06-30 DIAGNOSIS — I130 Hypertensive heart and chronic kidney disease with heart failure and stage 1 through stage 4 chronic kidney disease, or unspecified chronic kidney disease: Secondary | ICD-10-CM | POA: Diagnosis not present

## 2018-06-30 DIAGNOSIS — Z79899 Other long term (current) drug therapy: Secondary | ICD-10-CM

## 2018-06-30 DIAGNOSIS — M545 Low back pain: Secondary | ICD-10-CM | POA: Diagnosis present

## 2018-06-30 DIAGNOSIS — M5136 Other intervertebral disc degeneration, lumbar region: Secondary | ICD-10-CM | POA: Diagnosis present

## 2018-06-30 DIAGNOSIS — R1084 Generalized abdominal pain: Secondary | ICD-10-CM | POA: Diagnosis not present

## 2018-06-30 DIAGNOSIS — K811 Chronic cholecystitis: Secondary | ICD-10-CM | POA: Diagnosis not present

## 2018-06-30 DIAGNOSIS — K828 Other specified diseases of gallbladder: Secondary | ICD-10-CM | POA: Diagnosis not present

## 2018-06-30 DIAGNOSIS — K8080 Other cholelithiasis without obstruction: Secondary | ICD-10-CM | POA: Diagnosis not present

## 2018-06-30 DIAGNOSIS — F0280 Dementia in other diseases classified elsewhere without behavioral disturbance: Secondary | ICD-10-CM | POA: Diagnosis present

## 2018-06-30 DIAGNOSIS — R1310 Dysphagia, unspecified: Secondary | ICD-10-CM | POA: Diagnosis not present

## 2018-06-30 DIAGNOSIS — G20 Parkinson's disease: Secondary | ICD-10-CM | POA: Diagnosis present

## 2018-06-30 DIAGNOSIS — R441 Visual hallucinations: Secondary | ICD-10-CM | POA: Diagnosis not present

## 2018-06-30 DIAGNOSIS — I10 Essential (primary) hypertension: Secondary | ICD-10-CM | POA: Diagnosis not present

## 2018-06-30 DIAGNOSIS — M4187 Other forms of scoliosis, lumbosacral region: Secondary | ICD-10-CM | POA: Diagnosis present

## 2018-06-30 DIAGNOSIS — M546 Pain in thoracic spine: Secondary | ICD-10-CM | POA: Diagnosis not present

## 2018-06-30 DIAGNOSIS — D649 Anemia, unspecified: Secondary | ICD-10-CM | POA: Diagnosis present

## 2018-06-30 DIAGNOSIS — Z86010 Personal history of colonic polyps: Secondary | ICD-10-CM

## 2018-06-30 DIAGNOSIS — E669 Obesity, unspecified: Secondary | ICD-10-CM | POA: Diagnosis present

## 2018-06-30 DIAGNOSIS — R109 Unspecified abdominal pain: Secondary | ICD-10-CM | POA: Diagnosis not present

## 2018-06-30 DIAGNOSIS — M4644 Discitis, unspecified, thoracic region: Secondary | ICD-10-CM | POA: Diagnosis not present

## 2018-06-30 DIAGNOSIS — I4891 Unspecified atrial fibrillation: Secondary | ICD-10-CM | POA: Diagnosis not present

## 2018-06-30 DIAGNOSIS — R140 Abdominal distension (gaseous): Secondary | ICD-10-CM | POA: Diagnosis not present

## 2018-06-30 DIAGNOSIS — M4624 Osteomyelitis of vertebra, thoracic region: Secondary | ICD-10-CM | POA: Diagnosis not present

## 2018-06-30 DIAGNOSIS — M48061 Spinal stenosis, lumbar region without neurogenic claudication: Secondary | ICD-10-CM | POA: Diagnosis not present

## 2018-07-01 ENCOUNTER — Inpatient Hospital Stay (HOSPITAL_COMMUNITY)
Admission: EM | Admit: 2018-07-01 | Discharge: 2018-07-09 | DRG: 418 | Disposition: A | Discharge status: Home Health | Payer: Medicare Other | Attending: Internal Medicine | Admitting: Internal Medicine

## 2018-07-01 ENCOUNTER — Emergency Department (HOSPITAL_COMMUNITY): Payer: Medicare Other

## 2018-07-01 ENCOUNTER — Encounter (HOSPITAL_COMMUNITY): Payer: Self-pay | Admitting: Emergency Medicine

## 2018-07-01 ENCOUNTER — Other Ambulatory Visit: Payer: Self-pay

## 2018-07-01 DIAGNOSIS — R6881 Early satiety: Secondary | ICD-10-CM | POA: Diagnosis not present

## 2018-07-01 DIAGNOSIS — G4733 Obstructive sleep apnea (adult) (pediatric): Secondary | ICD-10-CM | POA: Diagnosis present

## 2018-07-01 DIAGNOSIS — I4891 Unspecified atrial fibrillation: Secondary | ICD-10-CM | POA: Diagnosis present

## 2018-07-01 DIAGNOSIS — T17308A Unspecified foreign body in larynx causing other injury, initial encounter: Secondary | ICD-10-CM | POA: Diagnosis not present

## 2018-07-01 DIAGNOSIS — N183 Chronic kidney disease, stage 3 (moderate): Secondary | ICD-10-CM | POA: Diagnosis not present

## 2018-07-01 DIAGNOSIS — Z86010 Personal history of colonic polyps: Secondary | ICD-10-CM | POA: Diagnosis not present

## 2018-07-01 DIAGNOSIS — I480 Paroxysmal atrial fibrillation: Secondary | ICD-10-CM | POA: Diagnosis not present

## 2018-07-01 DIAGNOSIS — I130 Hypertensive heart and chronic kidney disease with heart failure and stage 1 through stage 4 chronic kidney disease, or unspecified chronic kidney disease: Secondary | ICD-10-CM | POA: Diagnosis not present

## 2018-07-01 DIAGNOSIS — M1990 Unspecified osteoarthritis, unspecified site: Secondary | ICD-10-CM | POA: Diagnosis present

## 2018-07-01 DIAGNOSIS — G20C Parkinsonism, unspecified: Secondary | ICD-10-CM | POA: Diagnosis present

## 2018-07-01 DIAGNOSIS — M4187 Other forms of scoliosis, lumbosacral region: Secondary | ICD-10-CM | POA: Diagnosis present

## 2018-07-01 DIAGNOSIS — M5136 Other intervertebral disc degeneration, lumbar region: Secondary | ICD-10-CM | POA: Diagnosis not present

## 2018-07-01 DIAGNOSIS — Z825 Family history of asthma and other chronic lower respiratory diseases: Secondary | ICD-10-CM | POA: Diagnosis not present

## 2018-07-01 DIAGNOSIS — G629 Polyneuropathy, unspecified: Secondary | ICD-10-CM | POA: Insufficient documentation

## 2018-07-01 DIAGNOSIS — G8929 Other chronic pain: Secondary | ICD-10-CM | POA: Diagnosis not present

## 2018-07-01 DIAGNOSIS — G20 Parkinson's disease: Secondary | ICD-10-CM | POA: Diagnosis not present

## 2018-07-01 DIAGNOSIS — Z7901 Long term (current) use of anticoagulants: Secondary | ICD-10-CM

## 2018-07-01 DIAGNOSIS — R109 Unspecified abdominal pain: Secondary | ICD-10-CM | POA: Diagnosis not present

## 2018-07-01 DIAGNOSIS — Z8249 Family history of ischemic heart disease and other diseases of the circulatory system: Secondary | ICD-10-CM | POA: Diagnosis not present

## 2018-07-01 DIAGNOSIS — H409 Unspecified glaucoma: Secondary | ICD-10-CM | POA: Insufficient documentation

## 2018-07-01 DIAGNOSIS — M4624 Osteomyelitis of vertebra, thoracic region: Secondary | ICD-10-CM

## 2018-07-01 DIAGNOSIS — E7800 Pure hypercholesterolemia, unspecified: Secondary | ICD-10-CM | POA: Insufficient documentation

## 2018-07-01 DIAGNOSIS — M545 Low back pain, unspecified: Secondary | ICD-10-CM

## 2018-07-01 DIAGNOSIS — M549 Dorsalgia, unspecified: Secondary | ICD-10-CM

## 2018-07-01 DIAGNOSIS — R140 Abdominal distension (gaseous): Secondary | ICD-10-CM | POA: Diagnosis not present

## 2018-07-01 DIAGNOSIS — G2581 Restless legs syndrome: Secondary | ICD-10-CM | POA: Insufficient documentation

## 2018-07-01 DIAGNOSIS — Z801 Family history of malignant neoplasm of trachea, bronchus and lung: Secondary | ICD-10-CM | POA: Diagnosis not present

## 2018-07-01 DIAGNOSIS — R443 Hallucinations, unspecified: Secondary | ICD-10-CM | POA: Diagnosis not present

## 2018-07-01 DIAGNOSIS — M21379 Foot drop, unspecified foot: Secondary | ICD-10-CM | POA: Insufficient documentation

## 2018-07-01 DIAGNOSIS — K219 Gastro-esophageal reflux disease without esophagitis: Secondary | ICD-10-CM | POA: Diagnosis present

## 2018-07-01 DIAGNOSIS — Z888 Allergy status to other drugs, medicaments and biological substances status: Secondary | ICD-10-CM | POA: Diagnosis not present

## 2018-07-01 DIAGNOSIS — Z01810 Encounter for preprocedural cardiovascular examination: Secondary | ICD-10-CM

## 2018-07-01 DIAGNOSIS — Z79899 Other long term (current) drug therapy: Secondary | ICD-10-CM | POA: Diagnosis not present

## 2018-07-01 DIAGNOSIS — D696 Thrombocytopenia, unspecified: Secondary | ICD-10-CM | POA: Diagnosis not present

## 2018-07-01 DIAGNOSIS — R1310 Dysphagia, unspecified: Secondary | ICD-10-CM | POA: Diagnosis not present

## 2018-07-01 DIAGNOSIS — E785 Hyperlipidemia, unspecified: Secondary | ICD-10-CM | POA: Diagnosis not present

## 2018-07-01 DIAGNOSIS — M4645 Discitis, unspecified, thoracolumbar region: Secondary | ICD-10-CM | POA: Diagnosis present

## 2018-07-01 DIAGNOSIS — K8080 Other cholelithiasis without obstruction: Secondary | ICD-10-CM | POA: Diagnosis not present

## 2018-07-01 DIAGNOSIS — Z8261 Family history of arthritis: Secondary | ICD-10-CM | POA: Diagnosis not present

## 2018-07-01 DIAGNOSIS — Z981 Arthrodesis status: Secondary | ICD-10-CM | POA: Diagnosis not present

## 2018-07-01 DIAGNOSIS — K5730 Diverticulosis of large intestine without perforation or abscess without bleeding: Secondary | ICD-10-CM | POA: Insufficient documentation

## 2018-07-01 DIAGNOSIS — M5416 Radiculopathy, lumbar region: Secondary | ICD-10-CM | POA: Diagnosis not present

## 2018-07-01 DIAGNOSIS — I5032 Chronic diastolic (congestive) heart failure: Secondary | ICD-10-CM | POA: Diagnosis not present

## 2018-07-01 DIAGNOSIS — M4727 Other spondylosis with radiculopathy, lumbosacral region: Secondary | ICD-10-CM | POA: Diagnosis present

## 2018-07-01 DIAGNOSIS — M48061 Spinal stenosis, lumbar region without neurogenic claudication: Secondary | ICD-10-CM | POA: Diagnosis not present

## 2018-07-01 DIAGNOSIS — N1831 Chronic kidney disease, stage 3a: Secondary | ICD-10-CM | POA: Insufficient documentation

## 2018-07-01 DIAGNOSIS — K828 Other specified diseases of gallbladder: Secondary | ICD-10-CM | POA: Diagnosis not present

## 2018-07-01 DIAGNOSIS — R7309 Other abnormal glucose: Secondary | ICD-10-CM | POA: Insufficient documentation

## 2018-07-01 LAB — C-REACTIVE PROTEIN: CRP: <0.8 mg/dL (ref <1.0)

## 2018-07-01 LAB — COMPREHENSIVE METABOLIC PANEL
ALT: 5 U/L (ref 0–44)
AST: 16 U/L (ref 15–41)
Albumin: 4 g/dL (ref 3.5–5.0)
Alkaline Phosphatase: 99 U/L (ref 38–126)
Anion gap: 10 (ref 5–15)
BUN: 22 mg/dL (ref 8–23)
CO2: 24 mmol/L (ref 22–32)
Calcium: 9.2 mg/dL (ref 8.9–10.3)
Chloride: 104 mmol/L (ref 98–111)
Creatinine, Ser: 1.27 mg/dL — ABNORMAL HIGH (ref 0.44–1.00)
GFR calc Af Amer: 48 mL/min — ABNORMAL LOW (ref >60)
GFR calc non Af Amer: 41 mL/min — ABNORMAL LOW (ref >60)
Glucose, Bld: 120 mg/dL — ABNORMAL HIGH (ref 70–99)
Potassium: 4.2 mmol/L (ref 3.5–5.1)
Sodium: 138 mmol/L (ref 135–145)
Total Bilirubin: 0.4 mg/dL (ref 0.3–1.2)
Total Protein: 6.8 g/dL (ref 6.5–8.1)

## 2018-07-01 LAB — LIPASE, BLOOD: Lipase: 30 U/L (ref 11–51)

## 2018-07-01 LAB — CBC
HCT: 40.3 % (ref 36.0–46.0)
Hemoglobin: 12 g/dL (ref 12.0–15.0)
MCH: 26.1 pg (ref 26.0–34.0)
MCHC: 29.8 g/dL — ABNORMAL LOW (ref 30.0–36.0)
MCV: 87.8 fL (ref 80.0–100.0)
Platelets: 145 10*3/uL — ABNORMAL LOW (ref 150–400)
RBC: 4.59 MIL/uL (ref 3.87–5.11)
RDW: 15.6 % — ABNORMAL HIGH (ref 11.5–15.5)
WBC: 5.6 10*3/uL (ref 4.0–10.5)
nRBC: 0 % (ref 0.0–0.2)

## 2018-07-01 LAB — URINALYSIS, ROUTINE W REFLEX MICROSCOPIC
Bilirubin Urine: NEGATIVE
Glucose, UA: NEGATIVE mg/dL
Ketones, ur: 5 mg/dL — AB
Nitrite: NEGATIVE
Protein, ur: NEGATIVE mg/dL
Specific Gravity, Urine: 1.023 (ref 1.005–1.030)
pH: 5 (ref 5.0–8.0)

## 2018-07-01 LAB — PROTIME-INR
INR: 2.34
Prothrombin Time: 25.3 seconds — ABNORMAL HIGH (ref 11.4–15.2)

## 2018-07-01 LAB — SEDIMENTATION RATE: Sed Rate: 9 mm/hr (ref 0–22)

## 2018-07-01 MED ORDER — PANTOPRAZOLE SODIUM 40 MG PO TBEC
40.0000 mg | DELAYED_RELEASE_TABLET | Freq: Two times a day (BID) | ORAL | Status: DC
  Administered 2018-07-01 – 2018-07-09 (×16): 40 mg via ORAL
  Filled 2018-07-01 (×17): qty 1

## 2018-07-01 MED ORDER — CALCIUM CARBONATE ANTACID 500 MG PO CHEW: 1.0000 | CHEWABLE_TABLET | ORAL | Status: DC | PRN

## 2018-07-01 MED ORDER — POTASSIUM CHLORIDE CRYS ER 20 MEQ PO TBCR
40.0000 meq | EXTENDED_RELEASE_TABLET | Freq: Every day | ORAL | Status: DC
  Administered 2018-07-02 – 2018-07-09 (×7): 40 meq via ORAL
  Filled 2018-07-01 (×7): qty 2

## 2018-07-01 MED ORDER — ONDANSETRON HCL 4 MG/2ML IJ SOLN: 4.0000 mg | Freq: Four times a day (QID) | INTRAMUSCULAR | Status: DC | PRN

## 2018-07-01 MED ORDER — VANCOMYCIN HCL 10 G IV SOLR
1750.0000 mg | Freq: Once | INTRAVENOUS | Status: AC
  Administered 2018-07-01: 1750 mg via INTRAVENOUS
  Filled 2018-07-01: qty 1750

## 2018-07-01 MED ORDER — IOHEXOL 300 MG/ML  SOLN
80.0000 mL | Freq: Once | INTRAMUSCULAR | Status: AC | PRN
  Administered 2018-07-01: 100 mL via INTRAVENOUS

## 2018-07-01 MED ORDER — OXYCODONE HCL 5 MG PO TABS
5.0000 mg | ORAL_TABLET | ORAL | Status: DC | PRN
  Administered 2018-07-02 – 2018-07-09 (×5): 5 mg via ORAL
  Filled 2018-07-01 (×6): qty 1

## 2018-07-01 MED ORDER — RISAQUAD PO CAPS
1.0000 | ORAL_CAPSULE | Freq: Every day | ORAL | Status: DC
  Administered 2018-07-02 – 2018-07-09 (×7): 1 via ORAL
  Filled 2018-07-01 (×8): qty 1

## 2018-07-01 MED ORDER — MORPHINE SULFATE (PF) 4 MG/ML IV SOLN
4.0000 mg | Freq: Once | INTRAVENOUS | Status: AC
  Administered 2018-07-01: 4 mg via INTRAVENOUS
  Filled 2018-07-01: qty 1

## 2018-07-01 MED ORDER — SODIUM CHLORIDE 0.9 % IV SOLN
1.0000 g | Freq: Two times a day (BID) | INTRAVENOUS | Status: DC
  Administered 2018-07-01 (×2): 1 g via INTRAVENOUS
  Filled 2018-07-01 (×4): qty 1

## 2018-07-01 MED ORDER — OXYCODONE HCL 5 MG PO TABS
10.0000 mg | ORAL_TABLET | ORAL | Status: DC | PRN
  Administered 2018-07-01 – 2018-07-02 (×2): 10 mg via ORAL
  Administered 2018-07-03: 5 mg via ORAL
  Administered 2018-07-05 – 2018-07-06 (×4): 10 mg via ORAL
  Filled 2018-07-01 (×7): qty 2

## 2018-07-01 MED ORDER — MORPHINE SULFATE (PF) 2 MG/ML IV SOLN: 1.0000 mg | INTRAVENOUS | Status: DC | PRN

## 2018-07-01 MED ORDER — ACETAMINOPHEN 325 MG PO TABS
650.0000 mg | ORAL_TABLET | Freq: Four times a day (QID) | ORAL | Status: DC
  Administered 2018-07-02 – 2018-07-09 (×22): 650 mg via ORAL
  Filled 2018-07-01 (×24): qty 2

## 2018-07-01 MED ORDER — TRAZODONE HCL 50 MG PO TABS: 25.0000 mg | ORAL_TABLET | Freq: Every evening | ORAL | Status: DC | PRN

## 2018-07-01 MED ORDER — PRAMIPEXOLE DIHYDROCHLORIDE 0.25 MG PO TABS
0.5000 mg | ORAL_TABLET | Freq: Every day | ORAL | Status: DC
  Administered 2018-07-01: 1 mg via ORAL
  Filled 2018-07-01: qty 4

## 2018-07-01 MED ORDER — METOPROLOL SUCCINATE ER 100 MG PO TB24
100.0000 mg | ORAL_TABLET | Freq: Every day | ORAL | Status: DC
  Administered 2018-07-02 – 2018-07-09 (×8): 100 mg via ORAL
  Filled 2018-07-01 (×9): qty 1

## 2018-07-01 MED ORDER — ONDANSETRON HCL 4 MG PO TABS: 4.0000 mg | ORAL_TABLET | Freq: Four times a day (QID) | ORAL | Status: DC | PRN

## 2018-07-01 MED ORDER — SODIUM CHLORIDE 0.9 % IV BOLUS
250.0000 mL | Freq: Once | INTRAVENOUS | Status: AC
  Administered 2018-07-01: 250 mL via INTRAVENOUS

## 2018-07-01 MED ORDER — GABAPENTIN 300 MG PO CAPS
300.0000 mg | ORAL_CAPSULE | Freq: Every day | ORAL | Status: DC
  Administered 2018-07-01 – 2018-07-08 (×8): 300 mg via ORAL
  Filled 2018-07-01 (×8): qty 1

## 2018-07-01 MED ORDER — SENNA 8.6 MG PO TABS
1.0000 | ORAL_TABLET | Freq: Two times a day (BID) | ORAL | Status: DC
  Administered 2018-07-01 – 2018-07-09 (×13): 8.6 mg via ORAL
  Filled 2018-07-01 (×14): qty 1

## 2018-07-01 MED ORDER — LATANOPROST 0.005 % OP SOLN
1.0000 [drp] | Freq: Every day | OPHTHALMIC | Status: DC
  Administered 2018-07-01 – 2018-07-08 (×8): 1 [drp] via OPHTHALMIC
  Filled 2018-07-01: qty 2.5

## 2018-07-01 MED ORDER — CARBIDOPA-LEVODOPA 25-100 MG PO TABS
2.0000 | ORAL_TABLET | Freq: Four times a day (QID) | ORAL | Status: DC
  Administered 2018-07-01 – 2018-07-09 (×31): 2 via ORAL
  Filled 2018-07-01 (×31): qty 2

## 2018-07-01 MED ORDER — FLECAINIDE ACETATE 50 MG PO TABS
50.0000 mg | ORAL_TABLET | Freq: Two times a day (BID) | ORAL | Status: DC
  Administered 2018-07-01 – 2018-07-09 (×15): 50 mg via ORAL
  Filled 2018-07-01 (×17): qty 1

## 2018-07-01 MED ORDER — CHLORZOXAZONE 500 MG PO TABS
500.0000 mg | ORAL_TABLET | Freq: Two times a day (BID) | ORAL | Status: DC
  Administered 2018-07-01 – 2018-07-09 (×15): 500 mg via ORAL
  Filled 2018-07-01 (×17): qty 1

## 2018-07-01 MED ORDER — PRAVASTATIN SODIUM 40 MG PO TABS
40.0000 mg | ORAL_TABLET | Freq: Every day | ORAL | Status: DC
  Administered 2018-07-02 – 2018-07-08 (×7): 40 mg via ORAL
  Filled 2018-07-01 (×7): qty 1

## 2018-07-01 MED ORDER — POTASSIUM CHLORIDE IN NACL 20-0.9 MEQ/L-% IV SOLN
INTRAVENOUS | Status: DC
  Administered 2018-07-01 – 2018-07-02 (×2): via INTRAVENOUS
  Filled 2018-07-01 (×2): qty 1000

## 2018-07-01 MED ORDER — PROBIOTIC-10 PO CHEW: CHEWABLE_TABLET | Freq: Every day | ORAL | Status: DC

## 2018-07-01 MED ORDER — VANCOMYCIN HCL 10 G IV SOLR: 1500.0000 mg | INTRAVENOUS | Status: DC

## 2018-07-01 MED ORDER — GADOBUTROL 1 MMOL/ML IV SOLN
9.0000 mL | Freq: Once | INTRAVENOUS | Status: AC | PRN
  Administered 2018-07-01: 9 mL via INTRAVENOUS

## 2018-07-01 MED ORDER — FUROSEMIDE 40 MG PO TABS: 40.0000 mg | ORAL_TABLET | ORAL | Status: DC | PRN

## 2018-07-01 MED ORDER — LIDOCAINE 5 % EX PTCH
1.0000 | MEDICATED_PATCH | CUTANEOUS | Status: DC
  Administered 2018-07-01 – 2018-07-09 (×8): 1 via TRANSDERMAL
  Filled 2018-07-01 (×11): qty 1

## 2018-07-01 NOTE — ED Notes (Addendum)
Pt put back in exam room from hallway and placed on Purewick.

## 2018-07-01 NOTE — ED Notes (Signed)
Roughly 250 in purewick canister and 650 on bladder scanner.

## 2018-07-01 NOTE — ED Triage Notes (Signed)
Reports chronic lower back pain scheduled for MRI on Monday.  Started oxycodone yesterday without relief.  Reports upper abd pain since this afternoon.  Denies nausea and vomiting. Denies diarrhea and constipation.  No urinary complaints.

## 2018-07-01 NOTE — ED Notes (Signed)
Report given to rn on 5n deborah

## 2018-07-01 NOTE — Plan of Care (Signed)

## 2018-07-01 NOTE — ED Notes (Signed)
Report attempted rn in a procedure call back

## 2018-07-01 NOTE — ED Notes (Signed)
Attempted x 3 to get someoine to take report unable

## 2018-07-01 NOTE — H&P (Signed)
History and Physical    [name] ZHY:[id number] DOB: 02/18/1941 DOA: 07/01/2018  PCP: [name], MD   Patient coming from: Home  I have personally briefly reviewed patient's old medical records in [place]  Chief Complaint: Severe back pain for 3 months  HPI: [name] is a 78 y.o. female with medical history significant of Parkinson's disease, congestive heart failure with preserved ejection fraction, chronic back pain, degenerative joint disease, gastroesophageal reflux disease, hyperlipidemia, hypertension and paroxysmal atrial fibrillation on warfarin who presents for evaluation of worsening onset of progressively worsening low back pain and abdominal pain which has been present for at least 3 months.  She has been experiencing dull and sharp low back pain for the past 3 months.  She is had multiple lumbar spine surgeries but her neurosurgeon moved away and she has not been able to see him.  She saw her primary care physician who recommended physical therapy and pain medications which she felt physical therapy only worsened her condition.  The pain is worse when she bends and when she walks.  It is improved with being in a recliner with her legs and back up.  She has chronic paresthesias to the lateral aspect of the left lower extremities but this is unchanged after a prior surgery.  She has had no bowel or bladder incontinence, no saddle anesthesia, no urinary symptoms, no fevers, or history of cancer.  She complains of intermittent abdominal pain which is transient moves around but mostly in a belt-like distribution around her lower abdomen initiating from her back.  She has no aggravating or alleviating factors.  She felt a little bit nauseated yesterday but had no vomiting bowel movements have been normal.  She has been taking oxycodone and gabapentin without much relief of her symptoms.  She ambulates with the aid of a cane.  She is scheduled for an MRI on  Monday ordered by her PCP due to difficulties with her back.  ED Course: Uncomfortable but nontoxic, held renal insufficiency though BUN is normal, dedicated images of the lumbar spine and CT of the abdomen shows no acute findings.  T scan of the L spine shows severe adjacent segmental disc degeneration at T12-L1.  There is some mild inflammation of the paravertebral fat.  MRI shows discitis with possible osteomyelitis at T12-L1.  Blood cultures were obtained.  Sed rate and CRP are within normal limits.  Pain was improved with morphine and patient more comfortable on reevaluation.  Review of Systems: As per HPI otherwise all other systems reviewed and  negative.    Past Medical History:  Diagnosis Date  . Allergic rhinitis   . Arthritis    "all over"  . CHF (congestive heart failure) ([place])   . Chronic back pain   . DJD (degenerative joint disease)   . GERD (gastroesophageal reflux disease)   . Hyperlipidemia   . Hypertension   . Lumbar stenosis   . OSA (obstructive sleep apnea)    "couldn't handle CPAP; use mouth guard some; not all the time" (01/06/2014)  . PAF (paroxysmal atrial fibrillation) ([place])   . Scoliosis    with radiculopathy L2-S1 with prior surgery  . Small bowel obstruction (HCC)    versus ileus after last bck surgery  . Spondylosis     Past Surgical History:  Procedure Laterality Date  . BACK SURGERY    . CATARACT EXTRACTION W/ INTRAOCULAR LENS  IMPLANT, BILATERAL  2011  . COLONOSCOPY  12/09/2004  .  LIPOMA EXCISION  1980's   "fatty tumors"  . [place] SURGERY  02/2009   "ruptured disc"  . NASAL SEPTUM SURGERY  80's  . POSTERIOR LUMBAR FUSION  06/2010; 10/2011   "placed screws, rods, spacers both times"  . REPAIR DURAL / CSF LEAK  02/2009  . TOTAL ABDOMINAL HYSTERECTOMY  02/1993    Social History   Social History Narrative   Patient lives at home with husband [name].   Patient is retired.   Patient has a high school education.    Patient has 2 children       reports that she has never smoked. She has never used smokeless tobacco. She reports that she does not drink alcohol or use drugs.  Allergies  Allergen Reactions  . Lyrica [Pregabalin] Other (See Comments)    Felt loopy  . Zocor [Simvastatin] Other (See Comments)    Myalgias  . Relafen [Nabumetone] Rash    Family History  Problem Relation Age of Onset  . Arthritis Mother   . Heart attack Father   . Hypertension Sister   . Lung cancer Sister   . Asthma Sister   . Allergies Sister      Prior to Admission medications   Medication Sig Start Date End Date Taking? Authorizing Provider  acetaminophen (TYLENOL) 325 MG tablet Take 325 mg by mouth daily as needed for mild pain (pain).    Yes [provider]  calcium carbonate (TUMS - DOSED IN MG ELEMENTAL CALCIUM) 500 MG chewable tablet Chew 1 tablet by mouth as needed for indigestion or heartburn.    Yes [provider]  carbidopa-levodopa (SINEMET IR) 25-100 MG tablet Take 2 tablets by mouth 4 (four) times daily. 02/02/18  Yes Tat, Eustace Quail, DO  chlorzoxazone (PARAFON) 500 MG tablet Take 500 mg by mouth 2 (two) times daily.  06/26/18  Yes [provider]  fexofenadine (ALLEGRA) 180 MG tablet Take 180 mg by mouth as needed. For allergies   Yes [provider]  flecainide (TAMBOCOR) 50 MG tablet TAKE 1 TABLET BY MOUTH 2 TIMES DAILY Patient taking differently: Take 50 mg by mouth 2 (two) times daily.  02/27/18  Yes Josue Hector, MD  furosemide (LASIX) 20 MG tablet Take 40 mg by mouth as needed.    Yes [provider]  gabapentin (NEURONTIN) 300 MG capsule Take 300 mg by mouth at bedtime.   Yes [provider]  metoprolol succinate (TOPROL-XL) 100 MG 24 hr tablet TAKE 1 TABLET BY MOUTH DAILY Patient taking differently: Take 100 mg by mouth daily.  01/30/18  Yes Josue Hector, MD  oxyCODONE-acetaminophen (PERCOCET/ROXICET) 5-325 MG tablet Take 1-2 tablets by mouth every 6 (six) hours as  needed. 06/29/18  Yes [provider]  pantoprazole (PROTONIX) 40 MG tablet Take 40 mg by mouth 2 (two) times daily.   Yes [provider]  potassium chloride SA (K-DUR,KLOR-CON) 20 MEQ tablet Take 40 mEq by mouth daily.    Yes [provider]  pramipexole (MIRAPEX) 0.5 MG tablet Take 0.5-1 mg by mouth at bedtime. For restless legs   Yes [provider]  pravastatin (PRAVACHOL) 40 MG tablet Take 40 mg by mouth at bedtime.    Yes [provider]  Probiotic Product (PROBIOTIC-10 PO) Take by mouth daily.   Yes [provider]  TRAVATAN Z 0.004 % SOLN ophthalmic solution Place 1 drop into both eyes at bedtime.  01/21/15  Yes [provider]  warfarin (COUMADIN) 2.5 MG  tablet Take 1 tablet (2.5 mg total) by mouth as directed. Patient taking differently: Take 2.5 mg by mouth See admin instructions. Hold on Tuesday and Thursday Take 2.5 mg all the other days 06/14/18 06/14/19 Yes Josue Hector, MD    Physical Exam:  Constitutional: NAD, calm, looks uncomfortable lying flat on the stretcher Vitals:   07/01/18 0550 07/01/18 0600 07/01/18 0630 07/01/18 1439  BP: (!) 159/66 (!) 147/67 (!) 149/73 (!) 159/75  Pulse: 62 64 67 71  Resp: 16   16  Temp:      TempSrc:      SpO2: 99% 97% 98% 99%   Eyes: PERRL, lids and conjunctivae normal ENMT: Mucous membranes are moist. Posterior pharynx clear of any exudate or lesions.Normal dentition.  Neck: normal, supple, no masses, no thyromegaly Respiratory: clear to auscultation bilaterally, no wheezing, no crackles. Normal respiratory effort. No accessory muscle use.  Cardiovascular: Regular rate and rhythm, no murmurs / rubs / gallops. No extremity edema. 2+ pedal pulses. No carotid bruits.  Abdomen: no tenderness, no masses palpated. No hepatosplenomegaly. Bowel sounds positive.  Musculoskeletal: no clubbing / cyanosis. No joint deformity upper and lower extremities. Good ROM, no contractures. Normal  muscle tone.  Pain with range of motion of lower extremities, mild pain with palpation of the thoracic and lumbar spine Skin: no rashes, lesions, ulcers. No induration Neurologic: CN 2-12 grossly intact. Sensation intact, DTR normal. Strength 5/5 in all 4.  Psychiatric: Normal judgment and insight. Alert and oriented x 3. Normal mood.    Labs on Admission: I have personally reviewed following labs and imaging studies  CBC: Recent Labs  Lab 07/01/18 0057  WBC 5.6  HGB 12.0  HCT 40.3  MCV 87.8  PLT 623*   Basic Metabolic Panel: Recent Labs  Lab 07/01/18 0057  NA 138  K 4.2  CL 104  CO2 24  GLUCOSE 120*  BUN 22  CREATININE 1.27*  CALCIUM 9.2   GFR: CrCl cannot be calculated (Unknown ideal weight.). Liver Function Tests: Recent Labs  Lab 07/01/18 0057  AST 16  ALT 5  ALKPHOS 99  BILITOT 0.4  PROT 6.8  ALBUMIN 4.0   Recent Labs  Lab 07/01/18 0057  LIPASE 30   No results for input(s): AMMONIA in the last 168 hours. Coagulation Profile: Recent Labs  Lab 06/28/18 1434 07/01/18 0830  INR 3.6* 2.34   Cardiac Enzymes: No results for input(s): CKTOTAL, CKMB, CKMBINDEX, TROPONINI in the last 168 hours. BNP (last 3 results) No results for input(s): PROBNP in the last 8760 hours. HbA1C: No results for input(s): HGBA1C in the last 72 hours. CBG: No results for input(s): GLUCAP in the last 168 hours. Lipid Profile: No results for input(s): CHOL, HDL, LDLCALC, TRIG, CHOLHDL, LDLDIRECT in the last 72 hours. Thyroid Function Tests: No results for input(s): TSH, T4TOTAL, FREET4, T3FREE, THYROIDAB in the last 72 hours. Anemia Panel: No results for input(s): VITAMINB12, FOLATE, FERRITIN, TIBC, IRON, RETICCTPCT in the last 72 hours. Urine analysis:    Component Value Date/Time   COLORURINE YELLOW 07/01/2018 0628   APPEARANCEUR CLEAR 07/01/2018 0628   LABSPEC 1.023 07/01/2018 0628   PHURINE 5.0 07/01/2018 0628   GLUCOSEU NEGATIVE 07/01/2018 0628   HGBUR MODERATE  (A) 07/01/2018 0628   BILIRUBINUR NEGATIVE 07/01/2018 0628   KETONESUR 5 (A) 07/01/2018 0628   PROTEINUR NEGATIVE 07/01/2018 0628   UROBILINOGEN 1.0 06/26/2010 1009   NITRITE NEGATIVE 07/01/2018 0628   LEUKOCYTESUR TRACE (A) 07/01/2018 0628    Radiological  Exams on Admission: Mr Thoracic Spine W Wo Contrast  Result Date: 07/01/2018 CLINICAL DATA:  Back pain, possible discitis at T12-L1, for further assessment. EXAM: MRI THORACIC AND LUMBAR SPINE WITHOUT CONTRAST TECHNIQUE: Multiplanar and multiecho pulse sequences of the thoracic and lumbar spine were obtained without intravenous contrast. COMPARISON:  07/01/2018 CT scan, and prior lumbar MRI from 09/29/2011 FINDINGS: Despite efforts by the technologist and patient, severe motion artifact is present on today's exam and could not be eliminated. This reduces exam sensitivity and specificity. MRI THORACIC SPINE FINDINGS Alignment:  No vertebral subluxation is observed. Vertebrae: There is evidence of spondylosis and degenerative disc disease in the cervical spine which is not well characterized. Dedicated cervical spine MRI with and without contrast could be utilized if definitive characterization is warranted. Abnormal reduced T1 signal in the T12 vertebral body with fluid signal in the T12-L1 intervertebral disc, endplate irregularities, and paraspinal edema at this level, appearance raises high suspicion for discitis-osteomyelitis. No obvious epidural abscess. Cord:  Unremarkable Paraspinal and other soft tissues: As noted above, there is paraspinal edema at the T12 level. Left kidney upper pole parapelvic cyst. Disc levels: No significant thoracic spine findings above the T7-8 level. T7-8: No impingement.  Right paracentral disc protrusion. T8-9: No impingement.  Right paracentral disc protrusion. T9-10: No impingement. Left paracentral/lateral recess disc protrusion. Right perineural cyst. T10-11: No impingement.  Left facet arthropathy. T11-12: No  impingement.  Mild disc bulge. T12-L1: Deferred for the lumbar spine report below. MRI LUMBAR SPINE FINDINGS Segmentation: The lowest lumbar type non-rib-bearing vertebra is labeled as L5. Alignment: 3 mm of degenerative retrolisthesis at L1-2. 3 mm of fused posterior listhesis at L2-3. Vertebrae: Posterolateral rod and pedicle screw fixators with pedicle screws at all levels between L1 and S1 bilaterally. There is inferior endplate concavity at X52 and anterior wedging at L1 with the pedicle screws at L1 potentially capturing the superior cortical surface. Fluid signal at T12-L1 but with only a equivocal enhancement in the intervertebral disc space. Paraspinal edema at this level. No appreciable findings of epidural abscess subject to the limitations caused by motion artifact and metal artifact. Posterior decompression at L4 and L5 with 4.8 by 1.8 by 1.8 cm fluid collection in this vicinity without significant enhancement along its margins. No communication of this fluid collection with the thecal sac is observed. Conus medullaris and cauda equina: Conus extends to the L1 level. Conus and cauda equina appear normal. Paraspinal and other soft tissues: As noted above there is paraspinal edema at the T12-L1 level. Bilateral renal fluid signal intensity lesions favor cysts. Disc levels: T12-L1: Moderate central narrowing of the thecal sac at this level due to disc bulge as well as prominence of the posterior epidural adipose tissues. L1-2: No impingement. Fused level. Posterior intervertebral spurring. Posterior decompression at L1-2. L2-3: No impingement. Fused level. Posterior intervertebral spurring. L3-4: Borderline right foraminal stenosis due to intervertebral spurring. L4-5: Borderline left foraminal stenosis due to intervertebral spurring. L5-S1: No impingement.  Fused level. IMPRESSION: 1. There several indicators of discitis-osteomyelitis at the T12-L1 level. There is fluid signal in the disc along with  considerable edema signal and some enhancement in the vertebral bodies, along with endplate irregularities and compression. There is also paraspinal edema. The only factor unusual for discitis is the relative lack of enhancement in the disc, which is mild if present at all. The a presumptive diagnosis of discitis- osteomyelitis is recommended and consider sampling the disc. No definite epidural abscess. 2. There are findings of  degenerative disc disease and spondylosis in the thoracic and lumbar spine, but the only impingement is at the T12-L1 level. At this level, there is moderate central narrowing of the thecal sac due to disc bulge and prominence of the posterior epidural adipose tissues. 3. Posterolateral rod and pedicle screw fixation extending from L1 through S1. Fluid collection along the posterior decompression at L4-5 does not appear to communicate with the joint and is likely a chronic incidental postoperative collection. Electronically Signed   By: Van Clines M.D.   On: 07/01/2018 14:35   Mr Lumbar Spine W Wo Contrast (assess For Abscess, Cord Compression)  Result Date: 07/01/2018 CLINICAL DATA:  Back pain, possible discitis at T12-L1, for further assessment. EXAM: MRI THORACIC AND LUMBAR SPINE WITHOUT CONTRAST TECHNIQUE: Multiplanar and multiecho pulse sequences of the thoracic and lumbar spine were obtained without intravenous contrast. COMPARISON:  07/01/2018 CT scan, and prior lumbar MRI from 09/29/2011 FINDINGS: Despite efforts by the technologist and patient, severe motion artifact is present on today's exam and could not be eliminated. This reduces exam sensitivity and specificity. MRI THORACIC SPINE FINDINGS Alignment:  No vertebral subluxation is observed. Vertebrae: There is evidence of spondylosis and degenerative disc disease in the cervical spine which is not well characterized. Dedicated cervical spine MRI with and without contrast could be utilized if definitive  characterization is warranted. Abnormal reduced T1 signal in the T12 vertebral body with fluid signal in the T12-L1 intervertebral disc, endplate irregularities, and paraspinal edema at this level, appearance raises high suspicion for discitis-osteomyelitis. No obvious epidural abscess. Cord:  Unremarkable Paraspinal and other soft tissues: As noted above, there is paraspinal edema at the T12 level. Left kidney upper pole parapelvic cyst. Disc levels: No significant thoracic spine findings above the T7-8 level. T7-8: No impingement.  Right paracentral disc protrusion. T8-9: No impingement.  Right paracentral disc protrusion. T9-10: No impingement. Left paracentral/lateral recess disc protrusion. Right perineural cyst. T10-11: No impingement.  Left facet arthropathy. T11-12: No impingement.  Mild disc bulge. T12-L1: Deferred for the lumbar spine report below. MRI LUMBAR SPINE FINDINGS Segmentation: The lowest lumbar type non-rib-bearing vertebra is labeled as L5. Alignment: 3 mm of degenerative retrolisthesis at L1-2. 3 mm of fused posterior listhesis at L2-3. Vertebrae: Posterolateral rod and pedicle screw fixators with pedicle screws at all levels between L1 and S1 bilaterally. There is inferior endplate concavity at P95 and anterior wedging at L1 with the pedicle screws at L1 potentially capturing the superior cortical surface. Fluid signal at T12-L1 but with only a equivocal enhancement in the intervertebral disc space. Paraspinal edema at this level. No appreciable findings of epidural abscess subject to the limitations caused by motion artifact and metal artifact. Posterior decompression at L4 and L5 with 4.8 by 1.8 by 1.8 cm fluid collection in this vicinity without significant enhancement along its margins. No communication of this fluid collection with the thecal sac is observed. Conus medullaris and cauda equina: Conus extends to the L1 level. Conus and cauda equina appear normal. Paraspinal and other soft  tissues: As noted above there is paraspinal edema at the T12-L1 level. Bilateral renal fluid signal intensity lesions favor cysts. Disc levels: T12-L1: Moderate central narrowing of the thecal sac at this level due to disc bulge as well as prominence of the posterior epidural adipose tissues. L1-2: No impingement. Fused level. Posterior intervertebral spurring. Posterior decompression at L1-2. L2-3: No impingement. Fused level. Posterior intervertebral spurring. L3-4: Borderline right foraminal stenosis due to intervertebral spurring. L4-5: Borderline left  foraminal stenosis due to intervertebral spurring. L5-S1: No impingement.  Fused level. IMPRESSION: 1. There several indicators of discitis-osteomyelitis at the T12-L1 level. There is fluid signal in the disc along with considerable edema signal and some enhancement in the vertebral bodies, along with endplate irregularities and compression. There is also paraspinal edema. The only factor unusual for discitis is the relative lack of enhancement in the disc, which is mild if present at all. The a presumptive diagnosis of discitis- osteomyelitis is recommended and consider sampling the disc. No definite epidural abscess. 2. There are findings of degenerative disc disease and spondylosis in the thoracic and lumbar spine, but the only impingement is at the T12-L1 level. At this level, there is moderate central narrowing of the thecal sac due to disc bulge and prominence of the posterior epidural adipose tissues. 3. Posterolateral rod and pedicle screw fixation extending from L1 through S1. Fluid collection along the posterior decompression at L4-5 does not appear to communicate with the joint and is likely a chronic incidental postoperative collection. Electronically Signed   By: Van Clines M.D.   On: 07/01/2018 14:35   Ct Abdomen Pelvis W Contrast  Result Date: 07/01/2018 CLINICAL DATA:  Right upper quadrant abdominal pain. EXAM: CT ABDOMEN AND PELVIS  WITH CONTRAST TECHNIQUE: Multidetector CT imaging of the abdomen and pelvis was performed using the standard protocol following bolus administration of intravenous contrast. CONTRAST:  182mL OMNIPAQUE IOHEXOL 300 MG/ML  SOLN COMPARISON:  MR 12/17/2017 . FINDINGS: Lower chest: No acute abnormality. Hepatobiliary: 6 mm cyst within the central liver as noted on previous MRI. No suspicious liver abnormality. Gallbladder sludge and tiny stone identified within the fundus. Stone measures approximately 4 mm. No gallbladder wall thickening or pericholecystic fluid. No biliary ductal dilatation. Pancreas: Unremarkable. No pancreatic ductal dilatation or surrounding inflammatory changes. Spleen: Normal in size without focal abnormality. Adrenals/Urinary Tract: Normal appearance of the adrenal glands. Left kidney cysts are again identified. No kidney mass or hydronephrosis identified bilaterally. Urinary bladder appears normal. Stomach/Bowel: Stomach is normal. The small bowel loops have a normal course and caliber. Extensive distal colonic diverticulosis without acute inflammation. Vascular/Lymphatic: Aortic atherosclerosis. No aneurysm. No abdominopelvic adenopathy identified. Reproductive: Status post hysterectomy. No adnexal masses. Other: No free fluid or fluid collections. Musculoskeletal: Extensive thoracolumbar spondylosis. Previous posterior hardware fixation of L1 through S1. Advanced spondylosis is identified at the T12-L1 level. IMPRESSION: 1. No acute findings within the abdomen or pelvis. 2. Gallbladder sludge and tiny stone noted within the fundus. No secondary signs of acute cholecystitis. No biliary ductal dilatation. 3. Kidney cysts 4.  Aortic Atherosclerosis (ICD10-I70.0). Electronically Signed   By: Kerby Moors M.D.   On: 07/01/2018 08:09   Ct L-spine No Charge  Result Date: 07/01/2018 CLINICAL DATA:  Low back pain EXAM: CT lumbar spine with contrast TECHNIQUE: Multiplanar CT images of the lumbar  spine were reconstructed from contemporary CT of the abdomen. CONTRAST:  None additional COMPARISON:  Lumbar radiography 05/16/2018 FINDINGS: Segmentation: 5 lumbar type vertebral bodies Alignment: Unremarkable Vertebrae: L1-S1 fusion. Interbody arthrodesis is solid from L2-S1. Interbody arthrodesis is not clearly demonstrated at L1-2 but there is posterior-lateral arthrodesis on the right at this level and no hardware failure. Endplate sclerosis and lucency at T12-L1, likely degenerative given vacuum phenomenon and appearance on comparison radiography. There is mild haziness of fat about this level needing correlation with labs. Paraspinal and other soft tissues: As above. Abdomen and pelvis CT reported separately Disc levels: T12- L1: Endplate lucency and sclerosis with  anterior disc space widening where there is vacuum phenomenon. Degenerative spurring. Mild posterior element hypertrophy. Patent canal and foramina L1-L2: ACDF with posterior-lateral solid arthrodesis. Patent canal after laminectomy L2-L3: Solid arthrodesis.  No evident impingement. L3-L4: Solid arthrodesis. No evident impingement. L4-L5: Solid arthrodesis. No evident impingement. L5-S1:Solid arthrodesis. No evident impingement. IMPRESSION: 1. Severe adjacent segment disc degeneration at T12-L1. There is mild inflammation of paravertebral fat, please ensure no clinical/laboratory signs of superimposed infection. 2. L1-S1 spinal fusion. Electronically Signed   By: Monte Fantasia M.D.   On: 07/01/2018 08:03     Assessment/Plan Principal Problem:   Discitis of thoracolumbar region Active Problems:   Osteoarthritis   Atrial fibrillation with RVR (HCC)   Chronic anticoagulation -warfarin therapy   Obstructive sleep apnea   Parkinsonism ([place])   1.  Discitis at the thoracolumbar region: Patient will be admitted into the hospital she has been started on cefepime and vancomycin per pharmacy recommendations.  I have ordered a CT-guided biopsy  of her disc at T12-L1 for tomorrow.  She is n.p.o. after midnight.  After that perhaps consultation with infectious diseases may be helpful.  2.  Osteoarthritis: Patient with fairly severe osteoarthritis of her extremities.  Will continue home medications.  3.  Pain control: I will start her on scheduled Tylenol and PRN oxycodone.  Morphine is ordered PRN for severe pain not relieved with oxycodone.  4.  Atrial fibrillation with rapid ventricular response: I will hold her warfarin tonight we will recheck a PT and INR in the morning.  Fully it will be acceptable and patient can have her procedure.  5.  Chronic anticoagulation on warfarin: As above.  6.  Obstructive sleep apnea: Family to bring in patient's CPAP machine.  7.  Parkinsonism continue home medications.  DVT prophylaxis: Currently anticoagulated with warfarin Code Status: Full code Family Communication: Spoke with patient's husband and daughter and granddaughter who are present at the time of admission.  Patient retains capacity. Disposition Plan: Likely home in 3 to 4 days once therapy decided upon.  Consults called: Eventual radiology for biopsy in a.m. Admission status: Inpatient   Lady Deutscher MD FACP [facility] Pager [phone]  If 7PM-7AM, please contact night-coverage [email or web address] Password [facility]  07/01/2018, 4:51 PM

## 2018-07-01 NOTE — Progress Notes (Signed)
Pharmacy Antibiotic Note  [name] is a 78 y.o. female admitted on 07/01/2018 with osteomyelitis.  Pharmacy has been consulted for cefepime/vancomycin dosing. Stated weight is 85kg. Patient is 65 inches tall.   Plan: Cefepime 1gm IV q12h Vancomycin 1750mg  IV x 1, then 1500mg  IV q36h for AUC of 495 using a Scr 1.27 Monitor clinical course, LOT, and renal function Vancomycin levels as indicated.       Temp (24hrs), Avg:98.3 F (36.8 C), Min:98.1 F (36.7 C), Max:98.4 F (36.9 C)  Recent Labs  Lab 07/01/18 0057  WBC 5.6  CREATININE 1.27*    CrCl cannot be calculated (Unknown ideal weight.).    Allergies  Allergen Reactions  . Lyrica [Pregabalin] Other (See Comments)    Felt loopy  . Zocor [Simvastatin] Other (See Comments)    Myalgias  . Relafen [Nabumetone] Rash   [name] A. Levada Dy, PharmD, [place] Pager: [phone] Please utilize Amion for appropriate phone number to reach the unit pharmacist ([place])    07/01/2018 3:35 PM

## 2018-07-01 NOTE — ED Provider Notes (Signed)
[facility] EMERGENCY DEPARTMENT Provider Note   CSN: [id number] Arrival date & time: 06/30/18  2347     History   Chief Complaint Chief Complaint  Patient presents with  . Abdominal Pain  . Back Pain    HPI [name] is a 78 y.o. female with history of CHF, chronic back pain, degenerative joint disease, GERD, HLD, HTN, paroxysmal A. fib presents for evaluation of gradual onset, progressively worsening low back and abdominal pain intermittently for several months.  She reports that she has been experiencing dull and sharp low back pain for the past 3 months or so.  She has a history of multiple lumbar spine surgeries but her neurosurgeon moved away.  Pain worsens bending and ambulating, improved somewhat sleeping in a recliner.  Has chronic paresthesias to the lateral aspect of the left lower extremity but reports this is unchanged after a prior surgery.  Denies bowel or bladder incontinence, saddle anesthesia, urinary symptoms, fevers, or history of cancer.  She is also complaining of intermittent abdominal pain which is transient and moves around.  No aggravating or alleviating factors noted.  She did report she felt a little bit nauseated yesterday but no vomiting.  Bowel movements have been normal.  She has been taking oxycodone and gabapentin without significant relief of her symptoms.  She does ambulate with the aid of a cane or a walker.  She is scheduled for an MRI in 2 days ordered by her PCP and follow-up with a neurosurgeon is pending.    The history is provided by the patient.    Past Medical History:  Diagnosis Date  . Allergic rhinitis   . Arthritis    "all over"  . CHF (congestive heart failure) ([place])   . Chronic back pain   . DJD (degenerative joint disease)   . GERD (gastroesophageal reflux disease)   . Hyperlipidemia   . Hypertension   . Lumbar stenosis   . OSA (obstructive sleep apnea)    "couldn't handle CPAP; use mouth guard some;  not all the time" (01/06/2014)  . PAF (paroxysmal atrial fibrillation) ([place])   . Scoliosis    with radiculopathy L2-S1 with prior surgery  . Small bowel obstruction ([facility])    versus ileus after last bck surgery  . Spondylosis     Patient Active Problem List   Diagnosis Date Noted  . Acute bronchitis due to infection 05/04/2017  . Cough 04/21/2016  . Medication management 04/18/2015  . Parkinsonism ([place]) 05/15/2014  . Chronic anticoagulation -warfarin therapy 02/03/2014  . Atrial fibrillation with RVR ([place]) 01/31/2014  . Gait instability 11/16/2013  . Encounter for therapeutic drug monitoring 07/20/2013  . Edema 03/03/2010  . ATRIAL FIBRILLATION 04/23/2008  . Elevated lipids 01/31/2008  . Obstructive sleep apnea 01/31/2008  . GLAUCOMA 01/30/2008  . Seasonal and perennial allergic rhinitis 01/30/2008  . DEGENERATIVE JOINT DISEASE 01/30/2008    Past Surgical History:  Procedure Laterality Date  . BACK SURGERY    . CATARACT EXTRACTION W/ INTRAOCULAR LENS  IMPLANT, BILATERAL  2011  . COLONOSCOPY  12/09/2004  . LIPOMA EXCISION  1980's   "fatty tumors"  . [place] SURGERY  02/2009   "ruptured disc"  . NASAL SEPTUM SURGERY  80's  . POSTERIOR LUMBAR FUSION  06/2010; 10/2011   "placed screws, rods, spacers both times"  . REPAIR DURAL / CSF LEAK  02/2009  . TOTAL ABDOMINAL HYSTERECTOMY  02/1993     OB History   No obstetric history  on file.      Home Medications    Prior to Admission medications   Medication Sig Start Date End Date Taking? Authorizing Provider  acetaminophen (TYLENOL) 325 MG tablet Take 325 mg by mouth daily as needed for mild pain (pain).    Yes [provider]  calcium carbonate (TUMS - DOSED IN MG ELEMENTAL CALCIUM) 500 MG chewable tablet Chew 1 tablet by mouth as needed for indigestion or heartburn.    Yes [provider]  carbidopa-levodopa (SINEMET IR) 25-100 MG tablet Take 2 tablets by mouth 4 (four) times daily. 02/02/18  Yes Tat,  Eustace Quail, DO  chlorzoxazone (PARAFON) 500 MG tablet Take 500 mg by mouth 2 (two) times daily.  06/26/18  Yes [provider]  fexofenadine (ALLEGRA) 180 MG tablet Take 180 mg by mouth as needed. For allergies   Yes [provider]  flecainide (TAMBOCOR) 50 MG tablet TAKE 1 TABLET BY MOUTH 2 TIMES DAILY Patient taking differently: Take 50 mg by mouth 2 (two) times daily.  02/27/18  Yes Josue Hector, MD  furosemide (LASIX) 20 MG tablet Take 40 mg by mouth as needed.    Yes [provider]  gabapentin (NEURONTIN) 300 MG capsule Take 300 mg by mouth at bedtime.   Yes [provider]  metoprolol succinate (TOPROL-XL) 100 MG 24 hr tablet TAKE 1 TABLET BY MOUTH DAILY Patient taking differently: Take 100 mg by mouth daily.  01/30/18  Yes Josue Hector, MD  oxyCODONE-acetaminophen (PERCOCET/ROXICET) 5-325 MG tablet Take 1-2 tablets by mouth every 6 (six) hours as needed. 06/29/18  Yes [provider]  pantoprazole (PROTONIX) 40 MG tablet Take 40 mg by mouth 2 (two) times daily.   Yes [provider]  potassium chloride SA (K-DUR,KLOR-CON) 20 MEQ tablet Take 40 mEq by mouth daily.    Yes [provider]  pramipexole (MIRAPEX) 0.5 MG tablet Take 0.5-1 mg by mouth at bedtime. For restless legs   Yes [provider]  pravastatin (PRAVACHOL) 40 MG tablet Take 40 mg by mouth at bedtime.    Yes [provider]  Probiotic Product (PROBIOTIC-10 PO) Take by mouth daily.   Yes [provider]  TRAVATAN Z 0.004 % SOLN ophthalmic solution Place 1 drop into both eyes at bedtime.  01/21/15  Yes [provider]  warfarin (COUMADIN) 2.5 MG tablet Take 1 tablet (2.5 mg total) by mouth as directed. Patient taking differently: Take 2.5 mg by mouth See admin instructions. Hold on Tuesday and Thursday Take 2.5 mg all the other days 06/14/18 06/14/19 Yes Josue Hector, MD    Family History Family History  Problem Relation Age of  Onset  . Arthritis Mother   . Heart attack Father   . Hypertension Sister   . Lung cancer Sister   . Asthma Sister   . Allergies Sister     Social History Social History   Tobacco Use  . Smoking status: Never Smoker  . Smokeless tobacco: Never Used  Substance Use Topics  . Alcohol use: No    Alcohol/week: 0.0 standard drinks    Comment: 02/01/2014 " glasseof wine once in a blue moon; < once/year"  . Drug use: No     Allergies   Lyrica [pregabalin]; Zocor [simvastatin]; and Relafen [nabumetone]   Review of Systems Review of Systems  Constitutional: Negative for chills and fever.  Respiratory: Negative for shortness of breath.   Cardiovascular: Negative for chest pain.  Gastrointestinal: Positive for abdominal  pain and nausea. Negative for constipation, diarrhea and vomiting.  Genitourinary: Negative for dysuria, frequency, hematuria and urgency.  Musculoskeletal: Positive for back pain.  Neurological: Negative for syncope and numbness.  All other systems reviewed and are negative.    Physical Exam Updated Vital Signs BP (!) 159/75 (BP Location: Right Arm)   Pulse 71   Temp 98.4 F (36.9 C) (Oral)   Resp 16   SpO2 99%   Physical Exam Vitals signs and nursing note reviewed.  Constitutional:      General: She is not in acute distress.    Appearance: She is well-developed.  HENT:     Head: Normocephalic and atraumatic.  Eyes:     General:        Right eye: No discharge.        Left eye: No discharge.     Conjunctiva/sclera: Conjunctivae normal.  Neck:     Vascular: No JVD.     Trachea: No tracheal deviation.  Cardiovascular:     Rate and Rhythm: Normal rate and regular rhythm.     Heart sounds: No murmur.     Comments: 2+ radial and DP/PT pulses bilaterally, Homans sign absent bilaterally,compartments are soft.  1+ pitting edema of the bilateral lower extremities, patient reports this is chronic and unchanged  Pulmonary:     Effort: Pulmonary effort is  normal.     Breath sounds: Normal breath sounds.  Abdominal:     General: Bowel sounds are decreased. There is no distension.     Palpations: Abdomen is soft.     Tenderness: There is abdominal tenderness in the epigastric area, periumbilical area, suprapubic area and left lower quadrant. There is no right CVA tenderness, left CVA tenderness, guarding or rebound. Negative signs include Murphy's sign, Rovsing's sign and McBurney's sign.  Musculoskeletal:     Comments: Well-healed surgical scar noted to the midline of the lumbar spine.  No midline lumbar spine tenderness, bilateral paralumbar muscle tenderness, left worse than right.  Bilateral SI joint tenderness.  5/5 strength of BLE major muscle groups.  Negative straight leg raise bilaterally.  Skin:    General: Skin is warm and dry.     Findings: No erythema.  Neurological:     Mental Status: She is alert.     Comments: Fluent speech with no evidence of dysarthria or aphasia, no facial droop.  Slightly altered sensation to light touch of the lateral aspect of the left lower extremity which patient reports is chronic and unchanged since her prior surgery.  Sensation otherwise intact to soft touch of extremities.  Psychiatric:        Behavior: Behavior normal.      ED Treatments / Results  Labs (all labs ordered are listed, but only abnormal results are displayed) Labs Reviewed  COMPREHENSIVE METABOLIC PANEL - Abnormal; Notable for the following components:      Result Value   Glucose, Bld 120 (*)    Creatinine, Ser 1.27 (*)    GFR calc non Af Amer 41 (*)    GFR calc Af Amer 48 (*)    All other components within normal limits  CBC - Abnormal; Notable for the following components:   MCHC 29.8 (*)    RDW 15.6 (*)    Platelets 145 (*)    All other components within normal limits  URINALYSIS, ROUTINE W REFLEX MICROSCOPIC - Abnormal; Notable for the following components:   Hgb urine dipstick MODERATE (*)    Ketones, ur 5 (*)  Leukocytes, UA TRACE (*)    Bacteria, UA RARE (*)    Non Squamous Epithelial 0-5 (*)    All other components within normal limits  PROTIME-INR - Abnormal; Notable for the following components:   Prothrombin Time 25.3 (*)    All other components within normal limits  CULTURE, BLOOD (ROUTINE X 2)  CULTURE, BLOOD (ROUTINE X 2)  LIPASE, BLOOD  SEDIMENTATION RATE  C-REACTIVE PROTEIN    EKG None  Radiology Mr Thoracic Spine W Wo Contrast  Result Date: 07/01/2018 CLINICAL DATA:  Back pain, possible discitis at T12-L1, for further assessment. EXAM: MRI THORACIC AND LUMBAR SPINE WITHOUT CONTRAST TECHNIQUE: Multiplanar and multiecho pulse sequences of the thoracic and lumbar spine were obtained without intravenous contrast. COMPARISON:  07/01/2018 CT scan, and prior lumbar MRI from 09/29/2011 FINDINGS: Despite efforts by the technologist and patient, severe motion artifact is present on today's exam and could not be eliminated. This reduces exam sensitivity and specificity. MRI THORACIC SPINE FINDINGS Alignment:  No vertebral subluxation is observed. Vertebrae: There is evidence of spondylosis and degenerative disc disease in the cervical spine which is not well characterized. Dedicated cervical spine MRI with and without contrast could be utilized if definitive characterization is warranted. Abnormal reduced T1 signal in the T12 vertebral body with fluid signal in the T12-L1 intervertebral disc, endplate irregularities, and paraspinal edema at this level, appearance raises high suspicion for discitis-osteomyelitis. No obvious epidural abscess. Cord:  Unremarkable Paraspinal and other soft tissues: As noted above, there is paraspinal edema at the T12 level. Left kidney upper pole parapelvic cyst. Disc levels: No significant thoracic spine findings above the T7-8 level. T7-8: No impingement.  Right paracentral disc protrusion. T8-9: No impingement.  Right paracentral disc protrusion. T9-10: No impingement.  Left paracentral/lateral recess disc protrusion. Right perineural cyst. T10-11: No impingement.  Left facet arthropathy. T11-12: No impingement.  Mild disc bulge. T12-L1: Deferred for the lumbar spine report below. MRI LUMBAR SPINE FINDINGS Segmentation: The lowest lumbar type non-rib-bearing vertebra is labeled as L5. Alignment: 3 mm of degenerative retrolisthesis at L1-2. 3 mm of fused posterior listhesis at L2-3. Vertebrae: Posterolateral rod and pedicle screw fixators with pedicle screws at all levels between L1 and S1 bilaterally. There is inferior endplate concavity at N39 and anterior wedging at L1 with the pedicle screws at L1 potentially capturing the superior cortical surface. Fluid signal at T12-L1 but with only a equivocal enhancement in the intervertebral disc space. Paraspinal edema at this level. No appreciable findings of epidural abscess subject to the limitations caused by motion artifact and metal artifact. Posterior decompression at L4 and L5 with 4.8 by 1.8 by 1.8 cm fluid collection in this vicinity without significant enhancement along its margins. No communication of this fluid collection with the thecal sac is observed. Conus medullaris and cauda equina: Conus extends to the L1 level. Conus and cauda equina appear normal. Paraspinal and other soft tissues: As noted above there is paraspinal edema at the T12-L1 level. Bilateral renal fluid signal intensity lesions favor cysts. Disc levels: T12-L1: Moderate central narrowing of the thecal sac at this level due to disc bulge as well as prominence of the posterior epidural adipose tissues. L1-2: No impingement. Fused level. Posterior intervertebral spurring. Posterior decompression at L1-2. L2-3: No impingement. Fused level. Posterior intervertebral spurring. L3-4: Borderline right foraminal stenosis due to intervertebral spurring. L4-5: Borderline left foraminal stenosis due to intervertebral spurring. L5-S1: No impingement.  Fused level.  IMPRESSION: 1. There several indicators of discitis-osteomyelitis at the  T12-L1 level. There is fluid signal in the disc along with considerable edema signal and some enhancement in the vertebral bodies, along with endplate irregularities and compression. There is also paraspinal edema. The only factor unusual for discitis is the relative lack of enhancement in the disc, which is mild if present at all. The a presumptive diagnosis of discitis- osteomyelitis is recommended and consider sampling the disc. No definite epidural abscess. 2. There are findings of degenerative disc disease and spondylosis in the thoracic and lumbar spine, but the only impingement is at the T12-L1 level. At this level, there is moderate central narrowing of the thecal sac due to disc bulge and prominence of the posterior epidural adipose tissues. 3. Posterolateral rod and pedicle screw fixation extending from L1 through S1. Fluid collection along the posterior decompression at L4-5 does not appear to communicate with the joint and is likely a chronic incidental postoperative collection. Electronically Signed   By: Van Clines M.D.   On: 07/01/2018 14:35   Mr Lumbar Spine W Wo Contrast (assess For Abscess, Cord Compression)  Result Date: 07/01/2018 CLINICAL DATA:  Back pain, possible discitis at T12-L1, for further assessment. EXAM: MRI THORACIC AND LUMBAR SPINE WITHOUT CONTRAST TECHNIQUE: Multiplanar and multiecho pulse sequences of the thoracic and lumbar spine were obtained without intravenous contrast. COMPARISON:  07/01/2018 CT scan, and prior lumbar MRI from 09/29/2011 FINDINGS: Despite efforts by the technologist and patient, severe motion artifact is present on today's exam and could not be eliminated. This reduces exam sensitivity and specificity. MRI THORACIC SPINE FINDINGS Alignment:  No vertebral subluxation is observed. Vertebrae: There is evidence of spondylosis and degenerative disc disease in the cervical spine  which is not well characterized. Dedicated cervical spine MRI with and without contrast could be utilized if definitive characterization is warranted. Abnormal reduced T1 signal in the T12 vertebral body with fluid signal in the T12-L1 intervertebral disc, endplate irregularities, and paraspinal edema at this level, appearance raises high suspicion for discitis-osteomyelitis. No obvious epidural abscess. Cord:  Unremarkable Paraspinal and other soft tissues: As noted above, there is paraspinal edema at the T12 level. Left kidney upper pole parapelvic cyst. Disc levels: No significant thoracic spine findings above the T7-8 level. T7-8: No impingement.  Right paracentral disc protrusion. T8-9: No impingement.  Right paracentral disc protrusion. T9-10: No impingement. Left paracentral/lateral recess disc protrusion. Right perineural cyst. T10-11: No impingement.  Left facet arthropathy. T11-12: No impingement.  Mild disc bulge. T12-L1: Deferred for the lumbar spine report below. MRI LUMBAR SPINE FINDINGS Segmentation: The lowest lumbar type non-rib-bearing vertebra is labeled as L5. Alignment: 3 mm of degenerative retrolisthesis at L1-2. 3 mm of fused posterior listhesis at L2-3. Vertebrae: Posterolateral rod and pedicle screw fixators with pedicle screws at all levels between L1 and S1 bilaterally. There is inferior endplate concavity at A12 and anterior wedging at L1 with the pedicle screws at L1 potentially capturing the superior cortical surface. Fluid signal at T12-L1 but with only a equivocal enhancement in the intervertebral disc space. Paraspinal edema at this level. No appreciable findings of epidural abscess subject to the limitations caused by motion artifact and metal artifact. Posterior decompression at L4 and L5 with 4.8 by 1.8 by 1.8 cm fluid collection in this vicinity without significant enhancement along its margins. No communication of this fluid collection with the thecal sac is observed. Conus  medullaris and cauda equina: Conus extends to the L1 level. Conus and cauda equina appear normal. Paraspinal and other soft tissues: As  noted above there is paraspinal edema at the T12-L1 level. Bilateral renal fluid signal intensity lesions favor cysts. Disc levels: T12-L1: Moderate central narrowing of the thecal sac at this level due to disc bulge as well as prominence of the posterior epidural adipose tissues. L1-2: No impingement. Fused level. Posterior intervertebral spurring. Posterior decompression at L1-2. L2-3: No impingement. Fused level. Posterior intervertebral spurring. L3-4: Borderline right foraminal stenosis due to intervertebral spurring. L4-5: Borderline left foraminal stenosis due to intervertebral spurring. L5-S1: No impingement.  Fused level. IMPRESSION: 1. There several indicators of discitis-osteomyelitis at the T12-L1 level. There is fluid signal in the disc along with considerable edema signal and some enhancement in the vertebral bodies, along with endplate irregularities and compression. There is also paraspinal edema. The only factor unusual for discitis is the relative lack of enhancement in the disc, which is mild if present at all. The a presumptive diagnosis of discitis- osteomyelitis is recommended and consider sampling the disc. No definite epidural abscess. 2. There are findings of degenerative disc disease and spondylosis in the thoracic and lumbar spine, but the only impingement is at the T12-L1 level. At this level, there is moderate central narrowing of the thecal sac due to disc bulge and prominence of the posterior epidural adipose tissues. 3. Posterolateral rod and pedicle screw fixation extending from L1 through S1. Fluid collection along the posterior decompression at L4-5 does not appear to communicate with the joint and is likely a chronic incidental postoperative collection. Electronically Signed   By: Van Clines M.D.   On: 07/01/2018 14:35   Ct Abdomen  Pelvis W Contrast  Result Date: 07/01/2018 CLINICAL DATA:  Right upper quadrant abdominal pain. EXAM: CT ABDOMEN AND PELVIS WITH CONTRAST TECHNIQUE: Multidetector CT imaging of the abdomen and pelvis was performed using the standard protocol following bolus administration of intravenous contrast. CONTRAST:  118m OMNIPAQUE IOHEXOL 300 MG/ML  SOLN COMPARISON:  MR 12/17/2017 . FINDINGS: Lower chest: No acute abnormality. Hepatobiliary: 6 mm cyst within the central liver as noted on previous MRI. No suspicious liver abnormality. Gallbladder sludge and tiny stone identified within the fundus. Stone measures approximately 4 mm. No gallbladder wall thickening or pericholecystic fluid. No biliary ductal dilatation. Pancreas: Unremarkable. No pancreatic ductal dilatation or surrounding inflammatory changes. Spleen: Normal in size without focal abnormality. Adrenals/Urinary Tract: Normal appearance of the adrenal glands. Left kidney cysts are again identified. No kidney mass or hydronephrosis identified bilaterally. Urinary bladder appears normal. Stomach/Bowel: Stomach is normal. The small bowel loops have a normal course and caliber. Extensive distal colonic diverticulosis without acute inflammation. Vascular/Lymphatic: Aortic atherosclerosis. No aneurysm. No abdominopelvic adenopathy identified. Reproductive: Status post hysterectomy. No adnexal masses. Other: No free fluid or fluid collections. Musculoskeletal: Extensive thoracolumbar spondylosis. Previous posterior hardware fixation of L1 through S1. Advanced spondylosis is identified at the T12-L1 level. IMPRESSION: 1. No acute findings within the abdomen or pelvis. 2. Gallbladder sludge and tiny stone noted within the fundus. No secondary signs of acute cholecystitis. No biliary ductal dilatation. 3. Kidney cysts 4.  Aortic Atherosclerosis (ICD10-I70.0). Electronically Signed   By: TKerby MoorsM.D.   On: 07/01/2018 08:09   Ct L-spine No Charge  Result Date:  07/01/2018 CLINICAL DATA:  Low back pain EXAM: CT lumbar spine with contrast TECHNIQUE: Multiplanar CT images of the lumbar spine were reconstructed from contemporary CT of the abdomen. CONTRAST:  None additional COMPARISON:  Lumbar radiography 05/16/2018 FINDINGS: Segmentation: 5 lumbar type vertebral bodies Alignment: Unremarkable Vertebrae: L1-S1 fusion. Interbody arthrodesis is  solid from L2-S1. Interbody arthrodesis is not clearly demonstrated at L1-2 but there is posterior-lateral arthrodesis on the right at this level and no hardware failure. Endplate sclerosis and lucency at T12-L1, likely degenerative given vacuum phenomenon and appearance on comparison radiography. There is mild haziness of fat about this level needing correlation with labs. Paraspinal and other soft tissues: As above. Abdomen and pelvis CT reported separately Disc levels: T12- L1: Endplate lucency and sclerosis with anterior disc space widening where there is vacuum phenomenon. Degenerative spurring. Mild posterior element hypertrophy. Patent canal and foramina L1-L2: ACDF with posterior-lateral solid arthrodesis. Patent canal after laminectomy L2-L3: Solid arthrodesis.  No evident impingement. L3-L4: Solid arthrodesis. No evident impingement. L4-L5: Solid arthrodesis. No evident impingement. L5-S1:Solid arthrodesis. No evident impingement. IMPRESSION: 1. Severe adjacent segment disc degeneration at T12-L1. There is mild inflammation of paravertebral fat, please ensure no clinical/laboratory signs of superimposed infection. 2. L1-S1 spinal fusion. Electronically Signed   By: Monte Fantasia M.D.   On: 07/01/2018 08:03    Procedures Procedures (including critical care time)  Medications Ordered in ED Medications  lidocaine (LIDODERM) 5 % 1 patch (1 patch Transdermal Patch Applied 07/01/18 0648)  morphine 4 MG/ML injection 4 mg (has no administration in time range)  ceFEPIme (MAXIPIME) 1 g in sodium chloride 0.9 % 100 mL IVPB (has  no administration in time range)  vancomycin (VANCOCIN) 1,750 mg in sodium chloride 0.9 % 500 mL IVPB (has no administration in time range)  vancomycin (VANCOCIN) 1,500 mg in sodium chloride 0.9 % 500 mL IVPB (has no administration in time range)  morphine 4 MG/ML injection 4 mg (4 mg Intravenous Given 07/01/18 0648)  sodium chloride 0.9 % bolus 250 mL (0 mLs Intravenous Stopped 07/01/18 0717)  iohexol (OMNIPAQUE) 300 MG/ML solution 80 mL (100 mLs Intravenous Contrast Given 07/01/18 0721)  gadobutrol (GADAVIST) 1 MMOL/ML injection 9 mL (9 mLs Intravenous Contrast Given 07/01/18 1333)     Initial Impression / Assessment and Plan / ED Course  I have reviewed the triage vital signs and the nursing notes.  Pertinent labs & imaging results that were available during my care of the patient were reviewed by me and considered in my medical decision making (see chart for details).     Patient presenting for evaluation of progressively worsening low back pain for 3 months.  She is afebrile, somewhat hypertensive while in the ED.  She is uncomfortable but nontoxic in appearance.  She is neurovascularly intact.  Lab work reviewed by me shows mild renal insufficiency, though BUN is within normal limits.  No metabolic derangements or anemia.  UA does not suggest UTI or nephrolithiasis.  CT imaging of the abdomen, pelvis, and dedicated images of the lumbar spine show no acute abdominopelvic findings.  There is gallbladder sludge and tiny gallstone noted within the fundus but no signs of acute cholecystitis or biliary ductal dilatation.  Her LFTs are within normal limits which is reassuring and I doubt acute hepatobiliary dysfunction given no right upper quadrant tenderness on examination of the abdomen.  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  We will obtain MRI for further evaluation and rule out of discitis, osteomyelitis, and spinal  abscess.  MRI shows several indicators of discitis osteomyelitis at T12-L1.  Will give broad-spectrum antibiotics.  Blood cultures have been obtained.  ESR CRP within normal limits.  Pain improved with morphine and patient resting comfortably on re-evaluation. Spoke with Dr. Evangeline Gula  with [facility] hospitalist service who agrees to assume care of patient and bring her into the hospital for further evaluation and management.  Patient was seen and evaluate Dr. Wyvonnia Dusky who agrees with assessment and plan at this time.  Final Clinical Impressions(s) / ED Diagnoses   Final diagnoses:  Low back pain  Discitis of thoracolumbar region  Acute osteomyelitis of thoracic spine [facility])    ED Discharge Orders    None       Renita Papa, PA-C 07/01/18 1607    Ezequiel Essex, MD 07/03/18 [phone]

## 2018-07-01 NOTE — ED Notes (Signed)
Patient transported to CT scan . 

## 2018-07-02 DIAGNOSIS — I4891 Unspecified atrial fibrillation: Secondary | ICD-10-CM

## 2018-07-02 DIAGNOSIS — Z7901 Long term (current) use of anticoagulants: Secondary | ICD-10-CM

## 2018-07-02 DIAGNOSIS — G20 Parkinson's disease: Secondary | ICD-10-CM

## 2018-07-02 LAB — BASIC METABOLIC PANEL
Anion gap: 10 (ref 5–15)
BUN: 16 mg/dL (ref 8–23)
CO2: 23 mmol/L (ref 22–32)
Calcium: 8.7 mg/dL — ABNORMAL LOW (ref 8.9–10.3)
Chloride: 106 mmol/L (ref 98–111)
Creatinine, Ser: 0.93 mg/dL (ref 0.44–1.00)
GFR calc Af Amer: >60 mL/min (ref >60)
GFR calc non Af Amer: >60 mL/min (ref >60)
Glucose, Bld: 97 mg/dL (ref 70–99)
Potassium: 3.9 mmol/L (ref 3.5–5.1)
Sodium: 139 mmol/L (ref 135–145)

## 2018-07-02 LAB — CBC
HCT: 37 % (ref 36.0–46.0)
Hemoglobin: 11.7 g/dL — ABNORMAL LOW (ref 12.0–15.0)
MCH: 27 pg (ref 26.0–34.0)
MCHC: 31.6 g/dL (ref 30.0–36.0)
MCV: 85.5 fL (ref 80.0–100.0)
Platelets: 130 10*3/uL — ABNORMAL LOW (ref 150–400)
RBC: 4.33 MIL/uL (ref 3.87–5.11)
RDW: 15.5 % (ref 11.5–15.5)
WBC: 4.4 10*3/uL (ref 4.0–10.5)
nRBC: 0 % (ref 0.0–0.2)

## 2018-07-02 LAB — PROTIME-INR
INR: 2.06
Prothrombin Time: 22.9 seconds — ABNORMAL HIGH (ref 11.4–15.2)

## 2018-07-02 MED ORDER — PRAMIPEXOLE DIHYDROCHLORIDE 0.125 MG PO TABS
0.5000 mg | ORAL_TABLET | Freq: Every day | ORAL | Status: DC
  Administered 2018-07-02 – 2018-07-08 (×7): 0.5 mg via ORAL
  Filled 2018-07-02 (×7): qty 4

## 2018-07-02 MED ORDER — WHITE PETROLATUM EX OINT
TOPICAL_OINTMENT | CUTANEOUS | Status: AC
  Administered 2018-07-02: 05:00:00
  Filled 2018-07-02: qty 28.35

## 2018-07-02 NOTE — Consult Note (Signed)
Chief Complaint   Chief Complaint  Patient presents with  . Abdominal Pain  . Back Pain    HPI   Consult requested by: Dr Wynelle Cleveland Reason for consult: discitis  HPI: [name] is a 78 y.o. female who presented to ED on 1/25 due to severe LBP pain. She underwent an MRI of her thoracic and lumbar spine revealing possible T12-L1 discitis/osteomyelitis. There is also a fluid collect posterior to L4 and L5 at site of decompression. NSY consultation was requested. Since being in the hospital, her low back pain has improved. She has mild radiation into her bilateral buttocks. Denies weakness in her legs. Denies bowel/bladder dysfunction. History of multiple back surgeries, last surgery in 2010 by Dr Luiz Ochoa.   Patient Active Problem List   Diagnosis Date Noted  . Abnormal glucose tolerance test 07/01/2018  . Colon, diverticulosis 07/01/2018  . 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 Glaucoma 07/01/2018  . Chronic kidney disease (CKD), stage III (moderate) ([facility]) 07/01/2018  . Neuropathy 07/01/2018  . Pure hypercholesterolemia 07/01/2018  . Restless leg 07/01/2018  . Discitis of thoracolumbar region 07/01/2018  . Acute bronchitis due to infection 05/04/2017  . Cough 04/21/2016  . Medication management 04/18/2015  . Parkinsonism ([facility]) 05/15/2014  . Chronic anticoagulation -warfarin therapy 02/03/2014  . Atrial fibrillation with RVR ([facility]) 01/31/2014  . Gait instability 11/16/2013  . Encounter for therapeutic drug monitoring 07/20/2013  . Edema 03/03/2010  . ATRIAL FIBRILLATION 04/23/2008  . Elevated lipids 01/31/2008  . Obstructive sleep apnea 01/31/2008  . GLAUCOMA 01/30/2008  . Seasonal and perennial allergic rhinitis 01/30/2008  . Osteoarthritis 01/30/2008    PMH: Past Medical History:  Diagnosis Date  . Allergic rhinitis   . Arthritis    "all over"  . CHF (congestive heart failure) ([facility])   . Chronic back pain   . DJD (degenerative joint disease)   . GERD (gastroesophageal  reflux disease)   . Hyperlipidemia   . Hypertension   . Lumbar stenosis   . OSA (obstructive sleep apnea)    "couldn't handle CPAP; use mouth guard some; not all the time" (01/06/2014)  . PAF (paroxysmal atrial fibrillation) ([facility])   . Scoliosis    with radiculopathy L2-S1 with prior surgery  . Small bowel obstruction ([facility])    versus ileus after last bck surgery  . Spondylosis     PSH: Past Surgical History:  Procedure Laterality Date  . BACK SURGERY    . CATARACT EXTRACTION W/ INTRAOCULAR LENS  IMPLANT, BILATERAL  2011  . COLONOSCOPY  12/09/2004  . LIPOMA EXCISION  1980's   "fatty tumors"  . [place] SURGERY  02/2009   "ruptured disc"  . NASAL SEPTUM SURGERY  80's  . POSTERIOR LUMBAR FUSION  06/2010; 10/2011   "placed screws, rods, spacers both times"  . REPAIR DURAL / CSF LEAK  02/2009  . TOTAL ABDOMINAL HYSTERECTOMY  02/1993    Medications Prior to Admission  Medication Sig Dispense Refill Last Dose  . acetaminophen (TYLENOL) 325 MG tablet Take 325 mg by mouth daily as needed for mild pain (pain).    Past Week at prn  . calcium carbonate (TUMS - DOSED IN MG ELEMENTAL CALCIUM) 500 MG chewable tablet Chew 1 tablet by mouth as needed for indigestion or heartburn.    06/17/2018  . carbidopa-levodopa (SINEMET IR) 25-100 MG tablet Take 2 tablets by mouth 4 (four) times daily. 720 tablet 1 06/30/2018 at Unknown time  . chlorzoxazone (PARAFON) 500 MG tablet Take 500 mg by  mouth 2 (two) times daily.    06/29/2018  . fexofenadine (ALLEGRA) 180 MG tablet Take 180 mg by mouth as needed. For allergies   06/10/2018 at prn  . flecainide (TAMBOCOR) 50 MG tablet TAKE 1 TABLET BY MOUTH 2 TIMES DAILY (Patient taking differently: Take 50 mg by mouth 2 (two) times daily. ) 180 tablet 2 06/30/2018 at Unknown time  . furosemide (LASIX) 20 MG tablet Take 40 mg by mouth as needed.    Past Month at prn  . gabapentin (NEURONTIN) 300 MG capsule Take 300 mg by mouth at bedtime.   06/30/2018 at Unknown time  .  metoprolol succinate (TOPROL-XL) 100 MG 24 hr tablet TAKE 1 TABLET BY MOUTH DAILY (Patient taking differently: Take 100 mg by mouth daily. ) 90 tablet 2 06/30/2018 at 0800   . oxyCODONE-acetaminophen (PERCOCET/ROXICET) 5-325 MG tablet Take 1-2 tablets by mouth every 6 (six) hours as needed.   06/30/2018 at prn  . pantoprazole (PROTONIX) 40 MG tablet Take 40 mg by mouth 2 (two) times daily.   06/30/2018 at Unknown time  . potassium chloride SA (K-DUR,KLOR-CON) 20 MEQ tablet Take 40 mEq by mouth daily.    06/30/2018 at Unknown time  . pramipexole (MIRAPEX) 0.5 MG tablet Take 0.5-1 mg by mouth at bedtime. For restless legs   06/29/2018  . pravastatin (PRAVACHOL) 40 MG tablet Take 40 mg by mouth at bedtime.    06/30/2018 at Unknown time  . Probiotic Product (PROBIOTIC-10 PO) Take by mouth daily.   06/30/2018 at Unknown time  . TRAVATAN Z 0.004 % SOLN ophthalmic solution Place 1 drop into both eyes at bedtime.   0 06/30/2018 at Unknown time  . warfarin (COUMADIN) 2.5 MG tablet Take 1 tablet (2.5 mg total) by mouth as directed. (Patient taking differently: Take 2.5 mg by mouth See admin instructions. Hold on Tuesday and Thursday Take 2.5 mg all the other days) 90 tablet 0 06/30/2018 at 2000    SH: Social History   Tobacco Use  . Smoking status: Never Smoker  . Smokeless tobacco: Never Used  Substance Use Topics  . Alcohol use: No    Alcohol/week: 0.0 standard drinks    Comment: 02/01/2014 " glasseof wine once in a blue moon; < once/year"  . Drug use: No    MEDS: Prior to Admission medications   Medication Sig Start Date End Date Taking? Authorizing Provider  acetaminophen (TYLENOL) 325 MG tablet Take 325 mg by mouth daily as needed for mild pain (pain).    Yes [provider]  calcium carbonate (TUMS - DOSED IN MG ELEMENTAL CALCIUM) 500 MG chewable tablet Chew 1 tablet by mouth as needed for indigestion or heartburn.    Yes [provider]  carbidopa-levodopa (SINEMET IR) 25-100 MG  tablet Take 2 tablets by mouth 4 (four) times daily. 02/02/18  Yes Tat, Eustace Quail, DO  chlorzoxazone (PARAFON) 500 MG tablet Take 500 mg by mouth 2 (two) times daily.  06/26/18  Yes [provider]  fexofenadine (ALLEGRA) 180 MG tablet Take 180 mg by mouth as needed. For allergies   Yes [provider]  flecainide (TAMBOCOR) 50 MG tablet TAKE 1 TABLET BY MOUTH 2 TIMES DAILY Patient taking differently: Take 50 mg by mouth 2 (two) times daily.  02/27/18  Yes Josue Hector, MD  furosemide (LASIX) 20 MG tablet Take 40 mg by mouth as needed.    Yes [provider]  gabapentin (NEURONTIN) 300 MG capsule Take 300 mg by mouth  at bedtime.   Yes [provider]  metoprolol succinate (TOPROL-XL) 100 MG 24 hr tablet TAKE 1 TABLET BY MOUTH DAILY Patient taking differently: Take 100 mg by mouth daily.  01/30/18  Yes Josue Hector, MD  oxyCODONE-acetaminophen (PERCOCET/ROXICET) 5-325 MG tablet Take 1-2 tablets by mouth every 6 (six) hours as needed. 06/29/18  Yes [provider]  pantoprazole (PROTONIX) 40 MG tablet Take 40 mg by mouth 2 (two) times daily.   Yes [provider]  potassium chloride SA (K-DUR,KLOR-CON) 20 MEQ tablet Take 40 mEq by mouth daily.    Yes [provider]  pramipexole (MIRAPEX) 0.5 MG tablet Take 0.5-1 mg by mouth at bedtime. For restless legs   Yes [provider]  pravastatin (PRAVACHOL) 40 MG tablet Take 40 mg by mouth at bedtime.    Yes [provider]  Probiotic Product (PROBIOTIC-10 PO) Take by mouth daily.   Yes [provider]  TRAVATAN Z 0.004 % SOLN ophthalmic solution Place 1 drop into both eyes at bedtime.  01/21/15  Yes [provider]  warfarin (COUMADIN) 2.5 MG tablet Take 1 tablet (2.5 mg total) by mouth as directed. Patient taking differently: Take 2.5 mg by mouth See admin instructions. Hold on Tuesday and Thursday Take 2.5 mg all the other days 06/14/18 06/14/19 Yes Josue Hector, MD    ALLERGY: Allergies  Allergen Reactions  . Lyrica [Pregabalin] Other (See Comments)    Felt loopy  . Zocor [Simvastatin] Other (See Comments)    Myalgias  . Relafen [Nabumetone] Rash    Social History   Tobacco Use  . Smoking status: Never Smoker  . Smokeless tobacco: Never Used  Substance Use Topics  . Alcohol use: No    Alcohol/week: 0.0 standard drinks    Comment: 02/01/2014 " glasseof wine once in a blue moon; < once/year"     Family History  Problem Relation Age of Onset  . Arthritis Mother   . Heart attack Father   . Hypertension Sister   . Lung cancer Sister   . Asthma Sister   . Allergies Sister      ROS   Review of Systems  Constitutional: Negative.   HENT: Negative.   Eyes: Negative.   Respiratory: Negative.   Cardiovascular: Negative.   Gastrointestinal: Negative.   Genitourinary: Negative.   Musculoskeletal: Positive for back pain. Negative for falls, joint pain, myalgias and neck pain.  Skin: Negative.   Neurological: Negative for dizziness, tingling, tremors, sensory change, speech change, focal weakness, seizures, loss of consciousness, weakness and headaches.    Exam   Vitals:   07/01/18 2002 07/02/18 0603  BP: (!) 156/58 (!) 142/67  Pulse: 69 68  Resp: 15   Temp: (!) 97.5 F (36.4 C) 98 F (36.7 C)  SpO2: 99% 97%   General appearance: WDWN, NAD Eyes: No scleral injection Cardiovascular: Regular rate and rhythm without murmurs, rubs, gallops. No edema or variciosities. Distal pulses normal. Pulmonary: Effort normal, non-labored breathing Musculoskeletal:     Muscle tone upper extremities: Normal    Muscle tone lower extremities: Normal    Motor exam: Upper Extremities Deltoid Bicep Tricep Grip  Right 5/5 5/5 5/5 5/5  Left 5/5 5/5 5/5 5/5   Lower Extremity IP Quad PF DF EHL  Right 5/5 5/5 5/5 5/5 5/5  Left 5/5 5/5 5/5 5/5 5/5   Neurological Mental Status:    - Patient is awake, alert, oriented to person, place,  month, year, and situation    -  Patient is able to give a clear and coherent history.    - No signs of aphasia or neglect Cranial Nerves    - II: Visual Fields are full. PERRL    - III/IV/VI: EOMI without ptosis or diploplia.     - V: Facial sensation is grossly normal    - VII: Facial movement is symmetric.     - VIII: hearing is intact to voice    - X: Uvula elevates symmetrically    - XI: Shoulder shrug is symmetric.    - XII: tongue is midline without atrophy or fasciculations.  Sensory: Sensation grossly intact to LT   Results - Imaging/Labs   Results for orders placed or performed during the hospital encounter of 07/01/18 (from the past 48 hour(s))  Lipase, blood     Status: None   Collection Time: 07/01/18 12:57 AM  Result Value Ref Range   Lipase 30 11 - 51 U/L    Comment: Performed at [facility], [address] [place], [place] [address]  Comprehensive metabolic panel     Status: Abnormal   Collection Time: 07/01/18 12:57 AM  Result Value Ref Range   Sodium 138 135 - 145 mmol/L   Potassium 4.2 3.5 - 5.1 mmol/L   Chloride 104 98 - 111 mmol/L   CO2 24 22 - 32 mmol/L   Glucose, Bld 120 (H) 70 - 99 mg/dL   BUN 22 8 - 23 mg/dL   Creatinine, Ser 1.27 (H) 0.44 - 1.00 mg/dL   Calcium 9.2 8.9 - 10.3 mg/dL   Total Protein 6.8 6.5 - 8.1 g/dL   Albumin 4.0 3.5 - 5.0 g/dL   AST 16 15 - 41 U/L   ALT 5 0 - 44 U/L   Alkaline Phosphatase 99 38 - 126 U/L   Total Bilirubin 0.4 0.3 - 1.2 mg/dL   GFR calc non Af Amer 41 (L) >60 mL/min   GFR calc Af Amer 48 (L) >60 mL/min   Anion gap 10 5 - 15    Comment: Performed at [facility] [address] [place], [place] [address]  CBC     Status: Abnormal   Collection Time: 07/01/18 12:57 AM  Result Value Ref Range   WBC 5.6 4.0 - 10.5 K/uL   RBC 4.59 3.87 - 5.11 MIL/uL   Hemoglobin 12.0 12.0 - 15.0 g/dL   HCT 40.3 36.0 - 46.0 %   MCV 87.8 80.0 - 100.0 fL   MCH 26.1 26.0 - 34.0 pg   MCHC 29.8 (L) 30.0 - 36.0 g/dL    RDW 15.6 (H) 11.5 - 15.5 %   Platelets 145 (L) 150 - 400 K/uL   nRBC 0.0 0.0 - 0.2 %    Comment: Performed at [facility], [place] [address], [place], [place] [address]  Urinalysis, Routine w reflex microscopic     Status: Abnormal   Collection Time: 07/01/18  6:28 AM  Result Value Ref Range   Color, Urine YELLOW YELLOW   APPearance CLEAR CLEAR   Specific Gravity, Urine 1.023 1.005 - 1.030   pH 5.0 5.0 - 8.0   Glucose, UA NEGATIVE NEGATIVE mg/dL   Hgb urine dipstick MODERATE (A) NEGATIVE   Bilirubin Urine NEGATIVE NEGATIVE   Ketones, ur 5 (A) NEGATIVE mg/dL   Protein, ur NEGATIVE NEGATIVE mg/dL   Nitrite NEGATIVE NEGATIVE   Leukocytes, UA TRACE (A) NEGATIVE   RBC / HPF 0-5 0 - 5 RBC/hpf   WBC,  UA 6-10 0 - 5 WBC/hpf   Bacteria, UA RARE (A) NONE SEEN   Squamous Epithelial / LPF 0-5 0 - 5   Mucus PRESENT    Ca Oxalate Crys, UA PRESENT    Non Squamous Epithelial 0-5 (A) NONE SEEN    Comment: Performed at [facility], [place] [address] [place] [address], [place], [place] [address]  Sedimentation rate     Status: None   Collection Time: 07/01/18  8:30 AM  Result Value Ref Range   Sed Rate 9 0 - 22 mm/hr    Comment: Performed at [facility] [address] [place], [place] [address]  C-reactive protein     Status: None   Collection Time: 07/01/18  8:30 AM  Result Value Ref Range   CRP <0.8 <1.0 mg/dL    Comment: Performed at [facility] [address] [place], [place] [address]  Protime-INR     Status: Abnormal   Collection Time: 07/01/18  8:30 AM  Result Value Ref Range   Prothrombin Time 25.3 (H) 11.4 - 15.2 seconds   INR 2.34     Comment: Performed at [facility] [address], [place], [place] [address]  Basic metabolic panel     Status: Abnormal   Collection Time: 07/02/18  4:38 AM  Result Value Ref Range   Sodium 139 135 - 145 mmol/L   Potassium 3.9 3.5 - 5.1 mmol/L   Chloride 106 98 - 111 mmol/L   CO2 23 22 - 32 mmol/L   Glucose, Bld 97 70 - 99 mg/dL     BUN 16 8 - 23 mg/dL   Creatinine, Ser 0.93 0.44 - 1.00 mg/dL   Calcium 8.7 (L) 8.9 - 10.3 mg/dL   GFR calc non Af Amer >60 >60 mL/min   GFR calc Af Amer >60 >60 mL/min   Anion gap 10 5 - 15    Comment: Performed at [facility], [place] [address], [place], [place] [address]  CBC     Status: Abnormal   Collection Time: 07/02/18  4:38 AM  Result Value Ref Range   WBC 4.4 4.0 - 10.5 K/uL   RBC 4.33 3.87 - 5.11 MIL/uL   Hemoglobin 11.7 (L) 12.0 - 15.0 g/dL   HCT 37.0 36.0 - 46.0 %   MCV 85.5 80.0 - 100.0 fL   MCH 27.0 26.0 - 34.0 pg   MCHC 31.6 30.0 - 36.0 g/dL   RDW 15.5 11.5 - 15.5 %   Platelets 130 (L) 150 - 400 K/uL   nRBC 0.0 0.0 - 0.2 %    Comment: Performed at [facility], [place] [address] [place], [place] [address]  Protime-INR     Status: Abnormal   Collection Time: 07/02/18  4:38 AM  Result Value Ref Range   Prothrombin Time 22.9 (H) 11.4 - 15.2 seconds   INR 2.06     Comment: Performed at [facility] [address] [place],  [address]    Mr Thoracic Spine W Wo Contrast  Result Date: 07/01/2018 CLINICAL DATA:  Back pain, possible discitis at T12-L1, for further assessment. EXAM: MRI THORACIC AND LUMBAR SPINE WITHOUT CONTRAST TECHNIQUE: Multiplanar and multiecho pulse sequences of the thoracic and lumbar spine were obtained without intravenous contrast. COMPARISON:  07/01/2018 CT scan, and prior lumbar MRI from 09/29/2011 FINDINGS: Despite efforts by the technologist and patient, severe motion artifact is present on today's exam and could not be eliminated.  This reduces exam sensitivity and specificity. MRI THORACIC SPINE FINDINGS Alignment:  No vertebral subluxation is observed. Vertebrae: There is evidence of spondylosis and degenerative disc disease in the cervical spine which is not well characterized. Dedicated cervical spine MRI with and without contrast could be utilized if definitive characterization is warranted. Abnormal reduced T1 signal in  the T12 vertebral body with fluid signal in the T12-L1 intervertebral disc, endplate irregularities, and paraspinal edema at this level, appearance raises high suspicion for discitis-osteomyelitis. No obvious epidural abscess. Cord:  Unremarkable Paraspinal and other soft tissues: As noted above, there is paraspinal edema at the T12 level. Left kidney upper pole parapelvic cyst. Disc levels: No significant thoracic spine findings above the T7-8 level. T7-8: No impingement.  Right paracentral disc protrusion. T8-9: No impingement.  Right paracentral disc protrusion. T9-10: No impingement. Left paracentral/lateral recess disc protrusion. Right perineural cyst. T10-11: No impingement.  Left facet arthropathy. T11-12: No impingement.  Mild disc bulge. T12-L1: Deferred for the lumbar spine report below. MRI LUMBAR SPINE FINDINGS Segmentation: The lowest lumbar type non-rib-bearing vertebra is labeled as L5. Alignment: 3 mm of degenerative retrolisthesis at L1-2. 3 mm of fused posterior listhesis at L2-3. Vertebrae: Posterolateral rod and pedicle screw fixators with pedicle screws at all levels between L1 and S1 bilaterally. There is inferior endplate concavity at Y69 and anterior wedging at L1 with the pedicle screws at L1 potentially capturing the superior cortical surface. Fluid signal at T12-L1 but with only a equivocal enhancement in the intervertebral disc space. Paraspinal edema at this level. No appreciable findings of epidural abscess subject to the limitations caused by motion artifact and metal artifact. Posterior decompression at L4 and L5 with 4.8 by 1.8 by 1.8 cm fluid collection in this vicinity without significant enhancement along its margins. No communication of this fluid collection with the thecal sac is observed. Conus medullaris and cauda equina: Conus extends to the L1 level. Conus and cauda equina appear normal. Paraspinal and other soft tissues: As noted above there is paraspinal edema at the  T12-L1 level. Bilateral renal fluid signal intensity lesions favor cysts. Disc levels: T12-L1: Moderate central narrowing of the thecal sac at this level due to disc bulge as well as prominence of the posterior epidural adipose tissues. L1-2: No impingement. Fused level. Posterior intervertebral spurring. Posterior decompression at L1-2. L2-3: No impingement. Fused level. Posterior intervertebral spurring. L3-4: Borderline right foraminal stenosis due to intervertebral spurring. L4-5: Borderline left foraminal stenosis due to intervertebral spurring. L5-S1: No impingement.  Fused level. IMPRESSION: 1. There several indicators of discitis-osteomyelitis at the T12-L1 level. There is fluid signal in the disc along with considerable edema signal and some enhancement in the vertebral bodies, along with endplate irregularities and compression. There is also paraspinal edema. The only factor unusual for discitis is the relative lack of enhancement in the disc, which is mild if present at all. The a presumptive diagnosis of discitis- osteomyelitis is recommended and consider sampling the disc. No definite epidural abscess. 2. There are findings of degenerative disc disease and spondylosis in the thoracic and lumbar spine, but the only impingement is at the T12-L1 level. At this level, there is moderate central narrowing of the thecal sac due to disc bulge and prominence of the posterior epidural adipose tissues. 3. Posterolateral rod and pedicle screw fixation extending from L1 through S1. Fluid collection along the posterior decompression at L4-5 does not appear to communicate with the joint and is likely a chronic incidental postoperative collection. Electronically Signed  By: Van Clines M.D.   On: 07/01/2018 14:35   Mr Lumbar Spine W Wo Contrast (assess For Abscess, Cord Compression)  Result Date: 07/01/2018 CLINICAL DATA:  Back pain, possible discitis at T12-L1, for further assessment. EXAM: MRI THORACIC  AND LUMBAR SPINE WITHOUT CONTRAST TECHNIQUE: Multiplanar and multiecho pulse sequences of the thoracic and lumbar spine were obtained without intravenous contrast. COMPARISON:  07/01/2018 CT scan, and prior lumbar MRI from 09/29/2011 FINDINGS: Despite efforts by the technologist and patient, severe motion artifact is present on today's exam and could not be eliminated. This reduces exam sensitivity and specificity. MRI THORACIC SPINE FINDINGS Alignment:  No vertebral subluxation is observed. Vertebrae: There is evidence of spondylosis and degenerative disc disease in the cervical spine which is not well characterized. Dedicated cervical spine MRI with and without contrast could be utilized if definitive characterization is warranted. Abnormal reduced T1 signal in the T12 vertebral body with fluid signal in the T12-L1 intervertebral disc, endplate irregularities, and paraspinal edema at this level, appearance raises high suspicion for discitis-osteomyelitis. No obvious epidural abscess. Cord:  Unremarkable Paraspinal and other soft tissues: As noted above, there is paraspinal edema at the T12 level. Left kidney upper pole parapelvic cyst. Disc levels: No significant thoracic spine findings above the T7-8 level. T7-8: No impingement.  Right paracentral disc protrusion. T8-9: No impingement.  Right paracentral disc protrusion. T9-10: No impingement. Left paracentral/lateral recess disc protrusion. Right perineural cyst. T10-11: No impingement.  Left facet arthropathy. T11-12: No impingement.  Mild disc bulge. T12-L1: Deferred for the lumbar spine report below. MRI LUMBAR SPINE FINDINGS Segmentation: The lowest lumbar type non-rib-bearing vertebra is labeled as L5. Alignment: 3 mm of degenerative retrolisthesis at L1-2. 3 mm of fused posterior listhesis at L2-3. Vertebrae: Posterolateral rod and pedicle screw fixators with pedicle screws at all levels between L1 and S1 bilaterally. There is inferior endplate concavity  at D98 and anterior wedging at L1 with the pedicle screws at L1 potentially capturing the superior cortical surface. Fluid signal at T12-L1 but with only a equivocal enhancement in the intervertebral disc space. Paraspinal edema at this level. No appreciable findings of epidural abscess subject to the limitations caused by motion artifact and metal artifact. Posterior decompression at L4 and L5 with 4.8 by 1.8 by 1.8 cm fluid collection in this vicinity without significant enhancement along its margins. No communication of this fluid collection with the thecal sac is observed. Conus medullaris and cauda equina: Conus extends to the L1 level. Conus and cauda equina appear normal. Paraspinal and other soft tissues: As noted above there is paraspinal edema at the T12-L1 level. Bilateral renal fluid signal intensity lesions favor cysts. Disc levels: T12-L1: Moderate central narrowing of the thecal sac at this level due to disc bulge as well as prominence of the posterior epidural adipose tissues. L1-2: No impingement. Fused level. Posterior intervertebral spurring. Posterior decompression at L1-2. L2-3: No impingement. Fused level. Posterior intervertebral spurring. L3-4: Borderline right foraminal stenosis due to intervertebral spurring. L4-5: Borderline left foraminal stenosis due to intervertebral spurring. L5-S1: No impingement.  Fused level. IMPRESSION: 1. There several indicators of discitis-osteomyelitis at the T12-L1 level. There is fluid signal in the disc along with considerable edema signal and some enhancement in the vertebral bodies, along with endplate irregularities and compression. There is also paraspinal edema. The only factor unusual for discitis is the relative lack of enhancement in the disc, which is mild if present at all. The a presumptive diagnosis of discitis- osteomyelitis is recommended  and consider sampling the disc. No definite epidural abscess. 2. There are findings of degenerative disc  disease and spondylosis in the thoracic and lumbar spine, but the only impingement is at the T12-L1 level. At this level, there is moderate central narrowing of the thecal sac due to disc bulge and prominence of the posterior epidural adipose tissues. 3. Posterolateral rod and pedicle screw fixation extending from L1 through S1. Fluid collection along the posterior decompression at L4-5 does not appear to communicate with the joint and is likely a chronic incidental postoperative collection. Electronically Signed   By: Van Clines M.D.   On: 07/01/2018 14:35   Ct Abdomen Pelvis W Contrast  Result Date: 07/01/2018 CLINICAL DATA:  Right upper quadrant abdominal pain. EXAM: CT ABDOMEN AND PELVIS WITH CONTRAST TECHNIQUE: Multidetector CT imaging of the abdomen and pelvis was performed using the standard protocol following bolus administration of intravenous contrast. CONTRAST:  1102mL OMNIPAQUE IOHEXOL 300 MG/ML  SOLN COMPARISON:  MR 12/17/2017 . FINDINGS: Lower chest: No acute abnormality. Hepatobiliary: 6 mm cyst within the central liver as noted on previous MRI. No suspicious liver abnormality. Gallbladder sludge and tiny stone identified within the fundus. Stone measures approximately 4 mm. No gallbladder wall thickening or pericholecystic fluid. No biliary ductal dilatation. Pancreas: Unremarkable. No pancreatic ductal dilatation or surrounding inflammatory changes. Spleen: Normal in size without focal abnormality. Adrenals/Urinary Tract: Normal appearance of the adrenal glands. Left kidney cysts are again identified. No kidney mass or hydronephrosis identified bilaterally. Urinary bladder appears normal. Stomach/Bowel: Stomach is normal. The small bowel loops have a normal course and caliber. Extensive distal colonic diverticulosis without acute inflammation. Vascular/Lymphatic: Aortic atherosclerosis. No aneurysm. No abdominopelvic adenopathy identified. Reproductive: Status post hysterectomy. No  adnexal masses. Other: No free fluid or fluid collections. Musculoskeletal: Extensive thoracolumbar spondylosis. Previous posterior hardware fixation of L1 through S1. Advanced spondylosis is identified at the T12-L1 level. IMPRESSION: 1. No acute findings within the abdomen or pelvis. 2. Gallbladder sludge and tiny stone noted within the fundus. No secondary signs of acute cholecystitis. No biliary ductal dilatation. 3. Kidney cysts 4.  Aortic Atherosclerosis (ICD10-I70.0). Electronically Signed   By: Kerby Moors M.D.   On: 07/01/2018 08:09   Ct L-spine No Charge  Result Date: 07/01/2018 CLINICAL DATA:  Low back pain EXAM: CT lumbar spine with contrast TECHNIQUE: Multiplanar CT images of the lumbar spine were reconstructed from contemporary CT of the abdomen. CONTRAST:  None additional COMPARISON:  Lumbar radiography 05/16/2018 FINDINGS: Segmentation: 5 lumbar type vertebral bodies Alignment: Unremarkable Vertebrae: L1-S1 fusion. Interbody arthrodesis is solid from L2-S1. Interbody arthrodesis is not clearly demonstrated at L1-2 but there is posterior-lateral arthrodesis on the right at this level and no hardware failure. Endplate sclerosis and lucency at T12-L1, likely degenerative given vacuum phenomenon and appearance on comparison radiography. There is mild haziness of fat about this level needing correlation with labs. Paraspinal and other soft tissues: As above. Abdomen and pelvis CT reported separately Disc levels: T12- L1: Endplate lucency and sclerosis with anterior disc space widening where there is vacuum phenomenon. Degenerative spurring. Mild posterior element hypertrophy. Patent canal and foramina L1-L2: ACDF with posterior-lateral solid arthrodesis. Patent canal after laminectomy L2-L3: Solid arthrodesis.  No evident impingement. L3-L4: Solid arthrodesis. No evident impingement. L4-L5: Solid arthrodesis. No evident impingement. L5-S1:Solid arthrodesis. No evident impingement. IMPRESSION: 1.  Severe adjacent segment disc degeneration at T12-L1. There is mild inflammation of paravertebral fat, please ensure no clinical/laboratory signs of superimposed infection. 2. L1-S1 spinal fusion. Electronically Signed  By: Monte Fantasia M.D.   On: 07/01/2018 08:03    Impression/Plan   78 y.o. 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 While the MRI findings could represent infection, there is no leukocytosis or elevation of inflammatory markers. She is nontoxic so would hold on empiric abx. Rec IR consult for biospy/aspiration. There is no indication for NS intervention based on image findings (no epidural component). Will not routinely follow. Please call for any concerns.

## 2018-07-02 NOTE — Progress Notes (Signed)
PROGRESS NOTE    [name]   ZSW:[id number]  DOB: 08/07/1940  DOA: 07/01/2018 PCP: [name], MD   Brief Narrative:  [name] is a 78 y.o. female with medical history significant of Parkinson's disease, congestive heart failure with preserved ejection fraction, chronic back pain, degenerative joint disease, gastroesophageal reflux disease, hyperlipidemia, hypertension and paroxysmal atrial fibrillation on warfarin who presents for evaluation of worsening onset of progressively worsening low back pain and abdominal pain which has been present for at least 3 months.    Thoracolumbar MRI> several indicators of discitis-osteomyelitis at the T12-L1 level. The patient was started on VAnc and Cefepime and admitted.  Subjective: State she has pain in her lower back that moves around and has been present for "several months. No radiation to legs.     Assessment & Plan:   Principal Problem:   Discitis of thoracolumbar region? - s/p multiple back surgeries by neuro surgery- pain appears to be chronic to me ? Discitis on MRI- patient is not tender in the area of supposed discitis  - I have asked for a Neurosurgery eval- I am stopping antibiotics to increase yield if a culture is needed- if NS recommends that antibiotics be resumed, they should be ordered a that point  Active Problems:     Obstructive sleep apnea - does not use CPAP    Atrial fibrillation with RVR - cont Flecainide - coumadin on hold in case biopsy needed- INR is therapeutic   Thrombocytopenia - mild, appears to be chronic   Parkinsonism -cont Sinemet   Time spent in minutes: 35 DVT prophylaxis: INR therapeutic Code Status: Full code Family Communication:  Disposition Plan: f/u NS eval Consultants:   NS Procedures:   none Antimicrobials:  Anti-infectives (From admission, onward)   Start     Dose/Rate Route Frequency Ordered Stop   07/03/18 0400  vancomycin (VANCOCIN) 1,500 mg in  sodium chloride 0.9 % 500 mL IVPB     1,500 mg 250 mL/hr over 120 Minutes Intravenous Every 36 hours 07/01/18 1540     07/01/18 1545  ceFEPIme (MAXIPIME) 1 g in sodium chloride 0.9 % 100 mL IVPB     1 g 200 mL/hr over 30 Minutes Intravenous Every 12 hours 07/01/18 1540     07/01/18 1545  vancomycin (VANCOCIN) 1,750 mg in sodium chloride 0.9 % 500 mL IVPB     1,750 mg 250 mL/hr over 120 Minutes Intravenous  Once 07/01/18 1540 07/01/18 1942       Objective: Vitals:   07/01/18 1855 07/01/18 2002 07/01/18 2300 07/02/18 0603  BP: (!) 181/78 (!) 156/58  (!) 142/67  Pulse: 76 69  68  Resp:  15    Temp: 97.7 F (36.5 C) (!) 97.5 F (36.4 C)  98 F (36.7 C)  TempSrc: Oral Oral  Oral  SpO2: 97% 99%  97%  Weight:   84 kg   Height:   5\' 5"  (1.651 m)     Intake/Output Summary (Last 24 hours) at 07/02/2018 3220 Last data filed at 07/01/2018 2245 Gross per 24 hour  Intake 120 ml  Output 1500 ml  Net -1380 ml   Filed Weights   07/01/18 1800 07/01/18 2300  Weight: 84.8 kg 84 kg    Examination: General exam: Appears comfortable  HEENT: PERRLA, oral mucosa moist, no sclera icterus or thrush Respiratory system: Clear to auscultation. Respiratory effort normal. Cardiovascular system: S1 & S2 heard, RRR.   Gastrointestinal system: Abdomen soft, non-tender, nondistended. Normal  bowel sounds. Central nervous system: Alert and oriented. No focal neurological deficits. Extremities: No cyanosis, clubbing or edema MSK: tender in mid lumbar spine and right flank Skin: No rashes or ulcers Psychiatry:  Flat affect, very slow response to questions    Data Reviewed: I have personally reviewed following labs and imaging studies  CBC: Recent Labs  Lab 07/01/18 0057 07/02/18 0438  WBC 5.6 4.4  HGB 12.0 11.7*  HCT 40.3 37.0  MCV 87.8 85.5  PLT 145* 542*   Basic Metabolic Panel: Recent Labs  Lab 07/01/18 0057 07/02/18 0438  NA 138 139  K 4.2 3.9  CL 104 106  CO2 24 23  GLUCOSE  120* 97  BUN 22 16  CREATININE 1.27* 0.93  CALCIUM 9.2 8.7*   GFR: Estimated Creatinine Clearance: 55.9 mL/min (by C-G formula based on SCr of 0.93 mg/dL). Liver Function Tests: Recent Labs  Lab 07/01/18 0057  AST 16  ALT 5  ALKPHOS 99  BILITOT 0.4  PROT 6.8  ALBUMIN 4.0   Recent Labs  Lab 07/01/18 0057  LIPASE 30   No results for input(s): AMMONIA in the last 168 hours. Coagulation Profile: Recent Labs  Lab 06/28/18 1434 07/01/18 0830 07/02/18 0438  INR 3.6* 2.34 2.06   Cardiac Enzymes: No results for input(s): CKTOTAL, CKMB, CKMBINDEX, TROPONINI in the last 168 hours. BNP (last 3 results) No results for input(s): PROBNP in the last 8760 hours. HbA1C: No results for input(s): HGBA1C in the last 72 hours. CBG: No results for input(s): GLUCAP in the last 168 hours. Lipid Profile: No results for input(s): CHOL, HDL, LDLCALC, TRIG, CHOLHDL, LDLDIRECT in the last 72 hours. Thyroid Function Tests: No results for input(s): TSH, T4TOTAL, FREET4, T3FREE, THYROIDAB in the last 72 hours. Anemia Panel: No results for input(s): VITAMINB12, FOLATE, FERRITIN, TIBC, IRON, RETICCTPCT in the last 72 hours. Urine analysis:    Component Value Date/Time   COLORURINE YELLOW 07/01/2018 0628   APPEARANCEUR CLEAR 07/01/2018 0628   LABSPEC 1.023 07/01/2018 0628   PHURINE 5.0 07/01/2018 0628   GLUCOSEU NEGATIVE 07/01/2018 0628   HGBUR MODERATE (A) 07/01/2018 0628   BILIRUBINUR NEGATIVE 07/01/2018 0628   KETONESUR 5 (A) 07/01/2018 0628   PROTEINUR NEGATIVE 07/01/2018 0628   UROBILINOGEN 1.0 06/26/2010 1009   NITRITE NEGATIVE 07/01/2018 0628   LEUKOCYTESUR TRACE (A) 07/01/2018 0628   Sepsis Labs: @LABRCNTIP (procalcitonin:4,lacticidven:4) )No results found for this or any previous visit (from the past 240 hour(s)).       Radiology Studies: Mr Thoracic Spine W Wo Contrast  Result Date: 07/01/2018 CLINICAL DATA:  Back pain, possible discitis at T12-L1, for further  assessment. EXAM: MRI THORACIC AND LUMBAR SPINE WITHOUT CONTRAST TECHNIQUE: Multiplanar and multiecho pulse sequences of the thoracic and lumbar spine were obtained without intravenous contrast. COMPARISON:  07/01/2018 CT scan, and prior lumbar MRI from 09/29/2011 FINDINGS: Despite efforts by the technologist and patient, severe motion artifact is present on today's exam and could not be eliminated. This reduces exam sensitivity and specificity. MRI THORACIC SPINE FINDINGS Alignment:  No vertebral subluxation is observed. Vertebrae: There is evidence of spondylosis and degenerative disc disease in the cervical spine which is not well characterized. Dedicated cervical spine MRI with and without contrast could be utilized if definitive characterization is warranted. Abnormal reduced T1 signal in the T12 vertebral body with fluid signal in the T12-L1 intervertebral disc, endplate irregularities, and paraspinal edema at this level, appearance raises high suspicion for discitis-osteomyelitis. No obvious epidural abscess. Cord:  Unremarkable Paraspinal  and other soft tissues: As noted above, there is paraspinal edema at the T12 level. Left kidney upper pole parapelvic cyst. Disc levels: No significant thoracic spine findings above the T7-8 level. T7-8: No impingement.  Right paracentral disc protrusion. T8-9: No impingement.  Right paracentral disc protrusion. T9-10: No impingement. Left paracentral/lateral recess disc protrusion. Right perineural cyst. T10-11: No impingement.  Left facet arthropathy. T11-12: No impingement.  Mild disc bulge. T12-L1: Deferred for the lumbar spine report below. MRI LUMBAR SPINE FINDINGS Segmentation: The lowest lumbar type non-rib-bearing vertebra is labeled as L5. Alignment: 3 mm of degenerative retrolisthesis at L1-2. 3 mm of fused posterior listhesis at L2-3. Vertebrae: Posterolateral rod and pedicle screw fixators with pedicle screws at all levels between L1 and S1 bilaterally. There  is inferior endplate concavity at S01 and anterior wedging at L1 with the pedicle screws at L1 potentially capturing the superior cortical surface. Fluid signal at T12-L1 but with only a equivocal enhancement in the intervertebral disc space. Paraspinal edema at this level. No appreciable findings of epidural abscess subject to the limitations caused by motion artifact and metal artifact. Posterior decompression at L4 and L5 with 4.8 by 1.8 by 1.8 cm fluid collection in this vicinity without significant enhancement along its margins. No communication of this fluid collection with the thecal sac is observed. Conus medullaris and cauda equina: Conus extends to the L1 level. Conus and cauda equina appear normal. Paraspinal and other soft tissues: As noted above there is paraspinal edema at the T12-L1 level. Bilateral renal fluid signal intensity lesions favor cysts. Disc levels: T12-L1: Moderate central narrowing of the thecal sac at this level due to disc bulge as well as prominence of the posterior epidural adipose tissues. L1-2: No impingement. Fused level. Posterior intervertebral spurring. Posterior decompression at L1-2. L2-3: No impingement. Fused level. Posterior intervertebral spurring. L3-4: Borderline right foraminal stenosis due to intervertebral spurring. L4-5: Borderline left foraminal stenosis due to intervertebral spurring. L5-S1: No impingement.  Fused level. IMPRESSION: 1. There several indicators of discitis-osteomyelitis at the T12-L1 level. There is fluid signal in the disc along with considerable edema signal and some enhancement in the vertebral bodies, along with endplate irregularities and compression. There is also paraspinal edema. The only factor unusual for discitis is the relative lack of enhancement in the disc, which is mild if present at all. The a presumptive diagnosis of discitis- osteomyelitis is recommended and consider sampling the disc. No definite epidural abscess. 2. There are  findings of degenerative disc disease and spondylosis in the thoracic and lumbar spine, but the only impingement is at the T12-L1 level. At this level, there is moderate central narrowing of the thecal sac due to disc bulge and prominence of the posterior epidural adipose tissues. 3. Posterolateral rod and pedicle screw fixation extending from L1 through S1. Fluid collection along the posterior decompression at L4-5 does not appear to communicate with the joint and is likely a chronic incidental postoperative collection. Electronically Signed   By: Van Clines M.D.   On: 07/01/2018 14:35   Mr Lumbar Spine W Wo Contrast (assess For Abscess, Cord Compression)  Result Date: 07/01/2018 CLINICAL DATA:  Back pain, possible discitis at T12-L1, for further assessment. EXAM: MRI THORACIC AND LUMBAR SPINE WITHOUT CONTRAST TECHNIQUE: Multiplanar and multiecho pulse sequences of the thoracic and lumbar spine were obtained without intravenous contrast. COMPARISON:  07/01/2018 CT scan, and prior lumbar MRI from 09/29/2011 FINDINGS: Despite efforts by the technologist and patient, severe motion artifact is present on  today's exam and could not be eliminated. This reduces exam sensitivity and specificity. MRI THORACIC SPINE FINDINGS Alignment:  No vertebral subluxation is observed. Vertebrae: There is evidence of spondylosis and degenerative disc disease in the cervical spine which is not well characterized. Dedicated cervical spine MRI with and without contrast could be utilized if definitive characterization is warranted. Abnormal reduced T1 signal in the T12 vertebral body with fluid signal in the T12-L1 intervertebral disc, endplate irregularities, and paraspinal edema at this level, appearance raises high suspicion for discitis-osteomyelitis. No obvious epidural abscess. Cord:  Unremarkable Paraspinal and other soft tissues: As noted above, there is paraspinal edema at the T12 level. Left kidney upper pole  parapelvic cyst. Disc levels: No significant thoracic spine findings above the T7-8 level. T7-8: No impingement.  Right paracentral disc protrusion. T8-9: No impingement.  Right paracentral disc protrusion. T9-10: No impingement. Left paracentral/lateral recess disc protrusion. Right perineural cyst. T10-11: No impingement.  Left facet arthropathy. T11-12: No impingement.  Mild disc bulge. T12-L1: Deferred for the lumbar spine report below. MRI LUMBAR SPINE FINDINGS Segmentation: The lowest lumbar type non-rib-bearing vertebra is labeled as L5. Alignment: 3 mm of degenerative retrolisthesis at L1-2. 3 mm of fused posterior listhesis at L2-3. Vertebrae: Posterolateral rod and pedicle screw fixators with pedicle screws at all levels between L1 and S1 bilaterally. There is inferior endplate concavity at P82 and anterior wedging at L1 with the pedicle screws at L1 potentially capturing the superior cortical surface. Fluid signal at T12-L1 but with only a equivocal enhancement in the intervertebral disc space. Paraspinal edema at this level. No appreciable findings of epidural abscess subject to the limitations caused by motion artifact and metal artifact. Posterior decompression at L4 and L5 with 4.8 by 1.8 by 1.8 cm fluid collection in this vicinity without significant enhancement along its margins. No communication of this fluid collection with the thecal sac is observed. Conus medullaris and cauda equina: Conus extends to the L1 level. Conus and cauda equina appear normal. Paraspinal and other soft tissues: As noted above there is paraspinal edema at the T12-L1 level. Bilateral renal fluid signal intensity lesions favor cysts. Disc levels: T12-L1: Moderate central narrowing of the thecal sac at this level due to disc bulge as well as prominence of the posterior epidural adipose tissues. L1-2: No impingement. Fused level. Posterior intervertebral spurring. Posterior decompression at L1-2. L2-3: No impingement. Fused  level. Posterior intervertebral spurring. L3-4: Borderline right foraminal stenosis due to intervertebral spurring. L4-5: Borderline left foraminal stenosis due to intervertebral spurring. L5-S1: No impingement.  Fused level. IMPRESSION: 1. There several indicators of discitis-osteomyelitis at the T12-L1 level. There is fluid signal in the disc along with considerable edema signal and some enhancement in the vertebral bodies, along with endplate irregularities and compression. There is also paraspinal edema. The only factor unusual for discitis is the relative lack of enhancement in the disc, which is mild if present at all. The a presumptive diagnosis of discitis- osteomyelitis is recommended and consider sampling the disc. No definite epidural abscess. 2. There are findings of degenerative disc disease and spondylosis in the thoracic and lumbar spine, but the only impingement is at the T12-L1 level. At this level, there is moderate central narrowing of the thecal sac due to disc bulge and prominence of the posterior epidural adipose tissues. 3. Posterolateral rod and pedicle screw fixation extending from L1 through S1. Fluid collection along the posterior decompression at L4-5 does not appear to communicate with the joint and is likely  a chronic incidental postoperative collection. Electronically Signed   By: Van Clines M.D.   On: 07/01/2018 14:35   Ct Abdomen Pelvis W Contrast  Result Date: 07/01/2018 CLINICAL DATA:  Right upper quadrant abdominal pain. EXAM: CT ABDOMEN AND PELVIS WITH CONTRAST TECHNIQUE: Multidetector CT imaging of the abdomen and pelvis was performed using the standard protocol following bolus administration of intravenous contrast. CONTRAST:  169mL OMNIPAQUE IOHEXOL 300 MG/ML  SOLN COMPARISON:  MR 12/17/2017 . FINDINGS: Lower chest: No acute abnormality. Hepatobiliary: 6 mm cyst within the central liver as noted on previous MRI. No suspicious liver abnormality. Gallbladder sludge  and tiny stone identified within the fundus. Stone measures approximately 4 mm. No gallbladder wall thickening or pericholecystic fluid. No biliary ductal dilatation. Pancreas: Unremarkable. No pancreatic ductal dilatation or surrounding inflammatory changes. Spleen: Normal in size without focal abnormality. Adrenals/Urinary Tract: Normal appearance of the adrenal glands. Left kidney cysts are again identified. No kidney mass or hydronephrosis identified bilaterally. Urinary bladder appears normal. Stomach/Bowel: Stomach is normal. The small bowel loops have a normal course and caliber. Extensive distal colonic diverticulosis without acute inflammation. Vascular/Lymphatic: Aortic atherosclerosis. No aneurysm. No abdominopelvic adenopathy identified. Reproductive: Status post hysterectomy. No adnexal masses. Other: No free fluid or fluid collections. Musculoskeletal: Extensive thoracolumbar spondylosis. Previous posterior hardware fixation of L1 through S1. Advanced spondylosis is identified at the T12-L1 level. IMPRESSION: 1. No acute findings within the abdomen or pelvis. 2. Gallbladder sludge and tiny stone noted within the fundus. No secondary signs of acute cholecystitis. No biliary ductal dilatation. 3. Kidney cysts 4.  Aortic Atherosclerosis (ICD10-I70.0). Electronically Signed   By: Kerby Moors M.D.   On: 07/01/2018 08:09   Ct L-spine No Charge  Result Date: 07/01/2018 CLINICAL DATA:  Low back pain EXAM: CT lumbar spine with contrast TECHNIQUE: Multiplanar CT images of the lumbar spine were reconstructed from contemporary CT of the abdomen. CONTRAST:  None additional COMPARISON:  Lumbar radiography 05/16/2018 FINDINGS: Segmentation: 5 lumbar type vertebral bodies Alignment: Unremarkable Vertebrae: L1-S1 fusion. Interbody arthrodesis is solid from L2-S1. Interbody arthrodesis is not clearly demonstrated at L1-2 but there is posterior-lateral arthrodesis on the right at this level and no hardware  failure. Endplate sclerosis and lucency at T12-L1, likely degenerative given vacuum phenomenon and appearance on comparison radiography. There is mild haziness of fat about this level needing correlation with labs. Paraspinal and other soft tissues: As above. Abdomen and pelvis CT reported separately Disc levels: T12- L1: Endplate lucency and sclerosis with anterior disc space widening where there is vacuum phenomenon. Degenerative spurring. Mild posterior element hypertrophy. Patent canal and foramina L1-L2: ACDF with posterior-lateral solid arthrodesis. Patent canal after laminectomy L2-L3: Solid arthrodesis.  No evident impingement. L3-L4: Solid arthrodesis. No evident impingement. L4-L5: Solid arthrodesis. No evident impingement. L5-S1:Solid arthrodesis. No evident impingement. IMPRESSION: 1. Severe adjacent segment disc degeneration at T12-L1. There is mild inflammation of paravertebral fat, please ensure no clinical/laboratory signs of superimposed infection. 2. L1-S1 spinal fusion. Electronically Signed   By: Monte Fantasia M.D.   On: 07/01/2018 08:03      Scheduled Meds: . acetaminophen  650 mg Oral Q6H  . acidophilus  1 capsule Oral Daily  . carbidopa-levodopa  2 tablet Oral QID  . chlorzoxazone  500 mg Oral BID  . flecainide  50 mg Oral BID  . gabapentin  300 mg Oral QHS  . latanoprost  1 drop Both Eyes QHS  . lidocaine  1 patch Transdermal Q24H  . metoprolol succinate  100 mg  Oral Daily  . pantoprazole  40 mg Oral BID  . potassium chloride SA  40 mEq Oral Daily  . pramipexole  0.5-1 mg Oral QHS  . pravastatin  40 mg Oral QHS  . senna  1 tablet Oral BID   Continuous Infusions: . 0.9 % NaCl with KCl 20 mEq / L 75 mL/hr at 07/01/18 2015  . ceFEPime (MAXIPIME) IV 1 g (07/01/18 2245)  . [START ON 07/03/2018] vancomycin       LOS: 1 day      Debbe Odea, MD [facility] Pager: [email or web address] Password [email or web address] 07/02/2018, 7:22 AM

## 2018-07-03 ENCOUNTER — Other Ambulatory Visit: Payer: Medicare Other

## 2018-07-03 ENCOUNTER — Encounter (HOSPITAL_COMMUNITY): Payer: Self-pay | Admitting: *Deleted

## 2018-07-03 DIAGNOSIS — Z981 Arthrodesis status: Secondary | ICD-10-CM

## 2018-07-03 DIAGNOSIS — R109 Unspecified abdominal pain: Secondary | ICD-10-CM

## 2018-07-03 DIAGNOSIS — R6881 Early satiety: Secondary | ICD-10-CM

## 2018-07-03 DIAGNOSIS — R140 Abdominal distension (gaseous): Secondary | ICD-10-CM

## 2018-07-03 DIAGNOSIS — M545 Low back pain: Secondary | ICD-10-CM

## 2018-07-03 DIAGNOSIS — K8080 Other cholelithiasis without obstruction: Secondary | ICD-10-CM

## 2018-07-03 DIAGNOSIS — M48061 Spinal stenosis, lumbar region without neurogenic claudication: Secondary | ICD-10-CM

## 2018-07-03 DIAGNOSIS — M5416 Radiculopathy, lumbar region: Secondary | ICD-10-CM

## 2018-07-03 DIAGNOSIS — Z888 Allergy status to other drugs, medicaments and biological substances status: Secondary | ICD-10-CM

## 2018-07-03 DIAGNOSIS — G8929 Other chronic pain: Secondary | ICD-10-CM

## 2018-07-03 LAB — PROTIME-INR
INR: 1.6
Prothrombin Time: 18.9 seconds — ABNORMAL HIGH (ref 11.4–15.2)

## 2018-07-03 MED ORDER — DICYCLOMINE HCL 20 MG PO TABS
20.0000 mg | ORAL_TABLET | Freq: Three times a day (TID) | ORAL | Status: DC
  Administered 2018-07-04 – 2018-07-09 (×14): 20 mg via ORAL
  Filled 2018-07-03 (×14): qty 1

## 2018-07-03 MED ORDER — POLYETHYLENE GLYCOL 3350 17 G PO PACK
17.0000 g | PACK | Freq: Two times a day (BID) | ORAL | Status: DC
  Administered 2018-07-03 – 2018-07-05 (×4): 17 g via ORAL
  Filled 2018-07-03 (×5): qty 1

## 2018-07-03 MED ORDER — LIDOCAINE 5 % EX PTCH: 1.0000 | MEDICATED_PATCH | Freq: Two times a day (BID) | CUTANEOUS | 0 refills | Status: AC

## 2018-07-03 MED ORDER — WARFARIN - PHARMACIST DOSING INPATIENT: Freq: Every day | Status: DC

## 2018-07-03 MED ORDER — WARFARIN SODIUM 3 MG PO TABS
3.0000 mg | ORAL_TABLET | Freq: Once | ORAL | Status: AC
  Administered 2018-07-03: 3 mg via ORAL
  Filled 2018-07-03: qty 1

## 2018-07-03 NOTE — Consult Note (Signed)
[facility] CONSULT Reason for consult: Abdominal pain Referring Physician: [facility] hospitalist.  PCP: Dr. Marisue Humble.  Primary GI: Dr. Nicholas Lose is an 78 y.o. female.  HPI: Long-term patient of Dr Cristina Gong.Marland Kitchen  He is seen her in the past for colonoscopies last performed 2016 with diverticulosis and colon polyps removed 3 to 5-year repeat recommended.  She has chronic back pain and has had 4 or 5 back operations.  In addition she has been having some vague abdominal pain.  She is seen her PCP recently for severe back pain which preceded the abdominal pain.  She has had paresthesias.  She was sent to physical therapy and this made things worse she was given gabapentin and oxycodone recently and this did not help.  She came in with worsening back pain to the ER and CT scan of the lumbar spine showed severe disc degeneration with MRI showing possible osteomyelitis.  She has been seen by ID who does not feel that this is infectious and has been seen by neurosurgery who noted that she had no inflammatory markers and no signs of nerve compression and did not need any follow-up and recommended conservative therapy with office follow-up. According to the patient and her family her abdominal pain is been intermittent often comes after eating and is both right-sided and left-sided.  She tends toward constipation and has taken several laxatives in the past without much improvement.  Despite careful questioning I am unable to determine exactly where the pain in her abdomen emanates from the most and whether it gets better or worse with bowel movements. It is notable thatWithin the past 6 months she has had MRI of the abdomen as well as MRCP with fatty liver no other findings, ultrasound of the abdomen with tiny gallbladder polyps no gross gallstones and a cystic lesion in the left kidney.  These were felt to be benign cyst.  On this admission she has had CT of the abdomen which did question  gallbladder sludge and possible small gallstone.  Also showed the renal cyst which had not really changed. Her labs this admission revealed basically normal liver test normal CBC and slight pyuria with negative blood cultures. The plan originally was to go home but she and the family felt that her abdominal pain had not been adequately addressed.  Past Medical History:  Diagnosis Date  . Allergic rhinitis   . Arthritis    "all over"  . CHF (congestive heart failure) ([facility])   . Chronic back pain   . DJD (degenerative joint disease)   . GERD (gastroesophageal reflux disease)   . Hyperlipidemia   . Hypertension   . Lumbar stenosis   . OSA (obstructive sleep apnea)    "couldn't handle CPAP; use mouth guard some; not all the time" (01/06/2014)  . PAF (paroxysmal atrial fibrillation) ([facility])   . Scoliosis    with radiculopathy L2-S1 with prior surgery  . Small bowel obstruction (HCC)    versus ileus after last bck surgery  . Spondylosis     Past Surgical History:  Procedure Laterality Date  . BACK SURGERY    . CATARACT EXTRACTION W/ INTRAOCULAR LENS  IMPLANT, BILATERAL  2011  . COLONOSCOPY  12/09/2004  . LIPOMA EXCISION  1980's   "fatty tumors"  . [place] SURGERY  02/2009   "ruptured disc"  . NASAL SEPTUM SURGERY  80's  . POSTERIOR LUMBAR FUSION  06/2010; 10/2011   "placed screws, rods, spacers both times"  . REPAIR DURAL /  CSF LEAK  02/2009  . TOTAL ABDOMINAL HYSTERECTOMY  02/1993    Family History  Problem Relation Age of Onset  . Arthritis Mother   . Heart attack Father   . Hypertension Sister   . Lung cancer Sister   . Asthma Sister   . Allergies Sister     Social History:  reports that she has never smoked. She has never used smokeless tobacco. She reports that she does not drink alcohol or use drugs.  Allergies:  Allergies  Allergen Reactions  . Lyrica [Pregabalin] Other (See Comments)    Felt loopy  . Zocor [Simvastatin] Other (See Comments)    Myalgias  . Relafen  [Nabumetone] Rash    Medications; Prior to Admission medications   Medication Sig Start Date End Date Taking? Authorizing Provider  acetaminophen (TYLENOL) 325 MG tablet Take 325 mg by mouth daily as needed for mild pain (pain).    Yes [provider]  calcium carbonate (TUMS - DOSED IN MG ELEMENTAL CALCIUM) 500 MG chewable tablet Chew 1 tablet by mouth as needed for indigestion or heartburn.    Yes [provider]  carbidopa-levodopa (SINEMET IR) 25-100 MG tablet Take 2 tablets by mouth 4 (four) times daily. 02/02/18  Yes Tat, Eustace Quail, DO  chlorzoxazone (PARAFON) 500 MG tablet Take 500 mg by mouth 2 (two) times daily.  06/26/18  Yes [provider]  fexofenadine (ALLEGRA) 180 MG tablet Take 180 mg by mouth as needed. For allergies   Yes [provider]  flecainide (TAMBOCOR) 50 MG tablet TAKE 1 TABLET BY MOUTH 2 TIMES DAILY Patient taking differently: Take 50 mg by mouth 2 (two) times daily.  02/27/18  Yes Josue Hector, MD  furosemide (LASIX) 20 MG tablet Take 40 mg by mouth as needed.    Yes [provider]  gabapentin (NEURONTIN) 300 MG capsule Take 300 mg by mouth at bedtime.   Yes [provider]  metoprolol succinate (TOPROL-XL) 100 MG 24 hr tablet TAKE 1 TABLET BY MOUTH DAILY Patient taking differently: Take 100 mg by mouth daily.  01/30/18  Yes Josue Hector, MD  oxyCODONE-acetaminophen (PERCOCET/ROXICET) 5-325 MG tablet Take 1-2 tablets by mouth every 6 (six) hours as needed. 06/29/18  Yes [provider]  pantoprazole (PROTONIX) 40 MG tablet Take 40 mg by mouth 2 (two) times daily.   Yes [provider]  potassium chloride SA (K-DUR,KLOR-CON) 20 MEQ tablet Take 40 mEq by mouth daily.    Yes [provider]  pramipexole (MIRAPEX) 0.5 MG tablet Take 0.5-1 mg by mouth at bedtime. For restless legs   Yes [provider]  pravastatin (PRAVACHOL) 40 MG tablet Take 40 mg by mouth at bedtime.    Yes  [provider]  Probiotic Product (PROBIOTIC-10 PO) Take by mouth daily.   Yes [provider]  TRAVATAN Z 0.004 % SOLN ophthalmic solution Place 1 drop into both eyes at bedtime.  01/21/15  Yes [provider]  warfarin (COUMADIN) 2.5 MG tablet Take 1 tablet (2.5 mg total) by mouth as directed. Patient taking differently: Take 2.5 mg by mouth See admin instructions. Hold on Tuesday and Thursday Take 2.5 mg all the other days 06/14/18 06/14/19 Yes Josue Hector, MD  lidocaine (LIDODERM) 5 % Place 1-2 patches onto the skin every 12 (twelve) hours for 30 days. Remove & Discard patch within 12 hours or as directed by MD 07/03/18 08/02/18  Debbe Odea, MD   . acetaminophen  650  mg Oral Q6H  . acidophilus  1 capsule Oral Daily  . carbidopa-levodopa  2 tablet Oral QID  . chlorzoxazone  500 mg Oral BID  . flecainide  50 mg Oral BID  . gabapentin  300 mg Oral QHS  . latanoprost  1 drop Both Eyes QHS  . lidocaine  1 patch Transdermal Q24H  . metoprolol succinate  100 mg Oral Daily  . pantoprazole  40 mg Oral BID  . potassium chloride SA  40 mEq Oral Daily  . pramipexole  0.5 mg Oral QHS  . pravastatin  40 mg Oral QHS  . senna  1 tablet Oral BID   PRN Meds calcium carbonate, morphine injection, ondansetron **OR** ondansetron (ZOFRAN) IV, oxyCODONE, oxyCODONE, traZODone Results for orders placed or performed during the hospital encounter of 07/01/18 (from the past 48 hour(s))  Basic metabolic panel     Status: Abnormal   Collection Time: 07/02/18  4:38 AM  Result Value Ref Range   Sodium 139 135 - 145 mmol/L   Potassium 3.9 3.5 - 5.1 mmol/L   Chloride 106 98 - 111 mmol/L   CO2 23 22 - 32 mmol/L   Glucose, Bld 97 70 - 99 mg/dL   BUN 16 8 - 23 mg/dL   Creatinine, Ser 0.93 0.44 - 1.00 mg/dL   Calcium 8.7 (L) 8.9 - 10.3 mg/dL   GFR calc non Af Amer >60 >60 mL/min   GFR calc Af Amer >60 >60 mL/min   Anion gap 10 5 - 15    Comment: Performed at [facility],  [place] [address], [place], [place] [address]  CBC     Status: Abnormal   Collection Time: 07/02/18  4:38 AM  Result Value Ref Range   WBC 4.4 4.0 - 10.5 K/uL   RBC 4.33 3.87 - 5.11 MIL/uL   Hemoglobin 11.7 (L) 12.0 - 15.0 g/dL   HCT 37.0 36.0 - 46.0 %   MCV 85.5 80.0 - 100.0 fL   MCH 27.0 26.0 - 34.0 pg   MCHC 31.6 30.0 - 36.0 g/dL   RDW 15.5 11.5 - 15.5 %   Platelets 130 (L) 150 - 400 K/uL   nRBC 0.0 0.0 - 0.2 %    Comment: Performed at [facility], [place] [address], [place], [place] [address]  Protime-INR     Status: Abnormal   Collection Time: 07/02/18  4:38 AM  Result Value Ref Range   Prothrombin Time 22.9 (H) 11.4 - 15.2 seconds   INR 2.06     Comment: Performed at [facility] [address] [place], [place] [address]  Protime-INR     Status: Abnormal   Collection Time: 07/03/18  6:45 AM  Result Value Ref Range   Prothrombin Time 18.9 (H) 11.4 - 15.2 seconds   INR 1.60     Comment: Performed at [facility] [address], [place], [place] [address]    No results found.            Blood pressure (!) 103/47, pulse 75, temperature 97.7 F (36.5 C), temperature source Oral, resp. rate 18, height 5\' 5"  (1.651 m), weight 84 kg, SpO2 97 %.  Physical exam:   General--white female sitting in chair ENT--extraocular movements intact sclera nonicteric Neck--supple Heart--regular rate and rhythm without murmurs or gallops Lungs--clear Abdomen--somewhat obese nondistended with good bowel sounds and nontender.  Notable that the patient is unable to point to a particular area where she has  pain.   Psych--alert and oriented but does seem somewhat confused when asked specific questions about her pain.   Assessment: 1.  Abdominal pain.  This is quite vague and she has had extensive work-up that has included ultrasound, MRI and MRCP, and CT scan of the abdomen which failed to reveal any obvious cause of her pain.  She does have gallbladder polyp and  questionable sludge or stone in the gallbladder but her pain does not seem to be right upper quadrant.  Suspicion is that this is coming from her back. 2.  Chronic back pain.  She may have chronic discitis but has had multiple surgeries and has been seen by ID and neurosurgery who do not feel this is infectious or acute and recommended conservative therapy and outpatient follow-up. 3.  History of colon polyps.  Last colonoscopy 3 years ago and should be repeated feel we should wait on this until her back pain is doing better. 4.  Chronic constipation.  This is associated with bloating and unclear if this is relieved by bowel movement but may be worsened by eating 5.  Parkinson's disease 6.  History of congestive heart failure 7.  PAF previously on anticoagulation  Plan: 1.  Doubt that this is biliary disease but we will go ahead and obtain hepatobiliary scan with ejection fraction. 2.  We will place her on MiraLAX as well as dicyclomine to see if this will help relieve some of her abdominal pain. 3.  Have encouraged her to avoid the use of narcotics which can worsen her constipation and abdominal cramping.  The patient and her family all indicate that narcotics have not really helped her back pain at all.   Nancy Fetter 07/03/2018, 4:49 PM   This note was created using voice recognition software and minor errors may Have occurred unintentionally. Pager: [phone] If no answer or after hours call [phone]

## 2018-07-03 NOTE — Plan of Care (Signed)

## 2018-07-03 NOTE — Progress Notes (Signed)
ANTICOAGULATION CONSULT NOTE - Initial Consult  Pharmacy Consult for warfarin Indication: atrial fibrillation  Allergies  Allergen Reactions  . Lyrica [Pregabalin] Other (See Comments)    Felt loopy  . Zocor [Simvastatin] Other (See Comments)    Myalgias  . Relafen [Nabumetone] Rash    Patient Measurements: Height: 5\' 5"  (165.1 cm) Weight: 185 lb 3 oz (84 kg) IBW/kg (Calculated) : 57  Vital Signs: Temp: 97.7 F (36.5 C) (01/27 1334) Temp Source: Oral (01/27 1334) BP: 103/47 (01/27 1334) Pulse Rate: 75 (01/27 1334)  Labs: Recent Labs    07/01/18 0057 07/01/18 0830 07/02/18 0438 07/03/18 0645  HGB 12.0  --  11.7*  --   HCT 40.3  --  37.0  --   PLT 145*  --  130*  --   LABPROT  --  25.3* 22.9* 18.9*  INR  --  2.34 2.06 1.60  CREATININE 1.27*  --  0.93  --     Estimated Creatinine Clearance: 55.9 mL/min (by C-G formula based on SCr of 0.93 mg/dL).   Medical History: Past Medical History:  Diagnosis Date  . Allergic rhinitis   . Arthritis    "all over"  . CHF (congestive heart failure) ([facility])   . Chronic back pain   . DJD (degenerative joint disease)   . GERD (gastroesophageal reflux disease)   . Hyperlipidemia   . Hypertension   . Lumbar stenosis   . OSA (obstructive sleep apnea)    "couldn't handle CPAP; use mouth guard some; not all the time" (01/06/2014)  . PAF (paroxysmal atrial fibrillation) ([facility])   . Scoliosis    with radiculopathy L2-S1 with prior surgery  . Small bowel obstruction (H[facility])    versus ileus after last bck surgery  . Spondylosis     Medications:  Medications Prior to Admission  Medication Sig Dispense Refill Last Dose  . acetaminophen (TYLENOL) 325 MG tablet Take 325 mg by mouth daily as needed for mild pain (pain).    Past Week at prn  . calcium carbonate (TUMS - DOSED IN MG ELEMENTAL CALCIUM) 500 MG chewable tablet Chew 1 tablet by mouth as needed for indigestion or heartburn.    06/17/2018  . carbidopa-levodopa (SINEMET IR) 25-100  MG tablet Take 2 tablets by mouth 4 (four) times daily. 720 tablet 1 06/30/2018 at Unknown time  . chlorzoxazone (PARAFON) 500 MG tablet Take 500 mg by mouth 2 (two) times daily.    06/29/2018  . fexofenadine (ALLEGRA) 180 MG tablet Take 180 mg by mouth as needed. For allergies   06/10/2018 at prn  . flecainide (TAMBOCOR) 50 MG tablet TAKE 1 TABLET BY MOUTH 2 TIMES DAILY (Patient taking differently: Take 50 mg by mouth 2 (two) times daily. ) 180 tablet 2 06/30/2018 at Unknown time  . furosemide (LASIX) 20 MG tablet Take 40 mg by mouth as needed.    Past Month at prn  . gabapentin (NEURONTIN) 300 MG capsule Take 300 mg by mouth at bedtime.   06/30/2018 at Unknown time  . metoprolol succinate (TOPROL-XL) 100 MG 24 hr tablet TAKE 1 TABLET BY MOUTH DAILY (Patient taking differently: Take 100 mg by mouth daily. ) 90 tablet 2 06/30/2018 at 0800   . oxyCODONE-acetaminophen (PERCOCET/ROXICET) 5-325 MG tablet Take 1-2 tablets by mouth every 6 (six) hours as needed.   06/30/2018 at prn  . pantoprazole (PROTONIX) 40 MG tablet Take 40 mg by mouth 2 (two) times daily.   06/30/2018 at Unknown time  . potassium chloride  SA (K-DUR,KLOR-CON) 20 MEQ tablet Take 40 mEq by mouth daily.    06/30/2018 at Unknown time  . pramipexole (MIRAPEX) 0.5 MG tablet Take 0.5-1 mg by mouth at bedtime. For restless legs   06/29/2018  . pravastatin (PRAVACHOL) 40 MG tablet Take 40 mg by mouth at bedtime.    06/30/2018 at Unknown time  . Probiotic Product (PROBIOTIC-10 PO) Take by mouth daily.   06/30/2018 at Unknown time  . TRAVATAN Z 0.004 % SOLN ophthalmic solution Place 1 drop into both eyes at bedtime.   0 06/30/2018 at Unknown time  . warfarin (COUMADIN) 2.5 MG tablet Take 1 tablet (2.5 mg total) by mouth as directed. (Patient taking differently: Take 2.5 mg by mouth See admin instructions. Hold on Tuesday and Thursday Take 2.5 mg all the other days) 90 tablet 0 06/30/2018 at 2000    Assessment: 89 YOF with h/o Afib on warfarin admitted for  worsening low back pain and abdominal pain. Patient was scheduled for discharge which is now delayed for GI evaluation. Pharmacy consulted to dose warfarin tonight. INR down to 1.6 today. CBC reviewed from 1/26.   Home warfarin dose: No dose on Tue/Thu; 2.5 mg on all other day   Goal of Therapy:  INR 2-3 Monitor platelets by anticoagulation protocol: Yes   Plan:  -Warfarin 3 mg x 1 dose tonight -PT/INR tomorrow   Albertina Parr, PharmD., BCPS Clinical Pharmacist Clinical phone for 07/03/18 until 10:30pm: [phone] If after 109:30pm, please refer to [facility] for unit-specific pharmacist

## 2018-07-03 NOTE — Discharge Summary (Addendum)
Physician Discharge Summary  [name] RUDDEN KZS:[id number] DOB: 02-26-1941 DOA: 07/01/2018  PCP: [name], MD  Admit date: 07/01/2018 Discharge date: 07/03/2018  Admitted From: home Disposition:  home  Recommendations for Outpatient Follow-up:  1. F/u back pain and GI issues  Discharge Condition:  stable CODE STATUS:  Full code   Consultations:  NS  ID    Discharge Diagnoses:  Principal Problem:   Back pain Active Problems:   Osteoarthritis   Obstructive sleep apnea   Atrial fibrillation with RVR (HCC)   Chronic anticoagulation -warfarin therapy   Parkinsonism ([place])    Brief Summary: [name] is a 78 y.o.femalewith medical history significant ofParkinson's disease, congestive heart failure with preserved ejection fraction, chronic back pain, degenerative joint disease, gastroesophageal reflux disease, hyperlipidemia, hypertension and paroxysmal atrial fibrillation on warfarin who presents for evaluation of worsening onset of progressively worsening low back pain and abdominal pain which has been present for at least 3 months.   Thoracolumbar MRI> several indicators of discitis-osteomyelitis at the T12-L1 level. The patient was started on VAnc and Cefepime and admitted.  Hospital Course:  Principal Problem:  Chronic back pain   Discitis of thoracolumbar region? - s/p multiple back surgeries by neuro surgery- pain appears to be chronic to me ? Discitis on MRI- patient is not tender in the area of supposed discitis- I discontinued the antibiotics and asked for a Neurosurgery and ID opinion - the conclusion is that it is not likely she has a discitis and at this time does not need further work up    - in regards to her chronic back-she states she was prescribed PT which exacerbated this (thus no PT ordered again) - she states the pain moves around and on exam today she is tender in lumbar/flank areas bilaterally- there is no radiation down her legs  and no other neurological symptoms - there is moderate thoracic stenosis on the MRI which I have discussed with NS- NS does not feel it needs surgery or further inpatient management   - it appears to be muscular pain on my exam and I have asked her to try Voltaren gel  Abdominal bloating and pain when eating - the patient and her daughter have asked me for a GI consult- this is a chronic issue that she saw Dr Cristina Gong for last month but apparently has not yet resolved- I have taken the time to call Dr Cristina Gong who has reviewed her chart and will have her scheduled for a f/u visit over the next 1-2 wks- of note, the patient did not have any pain yesterday and did not even mention it to me- today she is a little bloated    Obstructive sleep apnea - does not use CPAP    Atrial fibrillation with RVR - cont Flecainide - coumadin held in case a disc aspiration needed- will be resumed - needs a f/u INR in 3-4 days.  Thrombocytopenia - mild, appears to be chronic   Parkinsonism -cont Sinemet   Discharge Exam: Vitals:   07/02/18 1922 07/03/18 0440  BP: (!) 153/55 (!) 161/74  Pulse: 69 63  Resp:    Temp: 98.4 F (36.9 C)   SpO2: 99% 97%   Vitals:   07/02/18 0603 07/02/18 1300 07/02/18 1922 07/03/18 0440  BP: (!) 142/67 (!) 124/48 (!) 153/55 (!) 161/74  Pulse: 68 74 69 63  Resp:      Temp: 98 F (36.7 C) 97.7 F (36.5 C) 98.4 F (36.9 C)  TempSrc: Oral Oral Oral   SpO2: 97% 99% 99% 97%  Weight:      Height:        General: Pt is alert, awake, not in acute distress Cardiovascular: RRR, S1/S2 +, no rubs, no gallops Respiratory: CTA bilaterally, no wheezing, no rhonchi Abdominal: Soft, NT, ND, bowel sounds + Extremities: no edema, no cyanosis   Discharge Instructions  Discharge Instructions    Diet - low sodium heart healthy   Complete by:  As directed    Increase activity slowly   Complete by:  As directed      Allergies as of 07/03/2018      Reactions   Lyrica  [pregabalin] Other (See Comments)   Felt loopy   Zocor [simvastatin] Other (See Comments)   Myalgias   Relafen [nabumetone] Rash      Medication List    TAKE these medications   acetaminophen 325 MG tablet Commonly known as:  TYLENOL Take 325 mg by mouth daily as needed for mild pain (pain).   calcium carbonate 500 MG chewable tablet Commonly known as:  TUMS - dosed in mg elemental calcium Chew 1 tablet by mouth as needed for indigestion or heartburn.   carbidopa-levodopa 25-100 MG tablet Commonly known as:  SINEMET IR Take 2 tablets by mouth 4 (four) times daily.   chlorzoxazone 500 MG tablet Commonly known as:  PARAFON Take 500 mg by mouth 2 (two) times daily.   fexofenadine 180 MG tablet Commonly known as:  ALLEGRA Take 180 mg by mouth as needed. For allergies   flecainide 50 MG tablet Commonly known as:  TAMBOCOR TAKE 1 TABLET BY MOUTH 2 TIMES DAILY   furosemide 20 MG tablet Commonly known as:  LASIX Take 40 mg by mouth as needed.   gabapentin 300 MG capsule Commonly known as:  NEURONTIN Take 300 mg by mouth at bedtime.   metoprolol succinate 100 MG 24 hr tablet Commonly known as:  TOPROL-XL TAKE 1 TABLET BY MOUTH DAILY   oxyCODONE-acetaminophen 5-325 MG tablet Commonly known as:  PERCOCET/ROXICET Take 1-2 tablets by mouth every 6 (six) hours as needed.   pantoprazole 40 MG tablet Commonly known as:  PROTONIX Take 40 mg by mouth 2 (two) times daily.   potassium chloride SA 20 MEQ tablet Commonly known as:  K-DUR,KLOR-CON Take 40 mEq by mouth daily.   pramipexole 0.5 MG tablet Commonly known as:  MIRAPEX Take 0.5-1 mg by mouth at bedtime. For restless legs   pravastatin 40 MG tablet Commonly known as:  PRAVACHOL Take 40 mg by mouth at bedtime.   PROBIOTIC-10 PO Take by mouth daily.   TRAVATAN Z 0.004 % Soln ophthalmic solution Generic drug:  Travoprost (BAK Free) Place 1 drop into both eyes at bedtime.   warfarin 2.5 MG tablet Commonly  known as:  COUMADIN Take as directed. If you are unsure how to take this medication, talk to your nurse or doctor. Original instructions:  Take 1 tablet (2.5 mg total) by mouth as directed. What changed:    when to take this  additional instructions       Allergies  Allergen Reactions  . Lyrica [Pregabalin] Other (See Comments)    Felt loopy  . Zocor [Simvastatin] Other (See Comments)    Myalgias  . Relafen [Nabumetone] Rash     Procedures/Studies:    Mr Thoracic Spine W Wo Contrast  Result Date: 07/01/2018 CLINICAL DATA:  Back pain, possible discitis at T12-L1, for further assessment. EXAM: MRI THORACIC AND  LUMBAR SPINE WITHOUT CONTRAST TECHNIQUE: Multiplanar and multiecho pulse sequences of the thoracic and lumbar spine were obtained without intravenous contrast. COMPARISON:  07/01/2018 CT scan, and prior lumbar MRI from 09/29/2011 FINDINGS: Despite efforts by the technologist and patient, severe motion artifact is present on today's exam and could not be eliminated. This reduces exam sensitivity and specificity. MRI THORACIC SPINE FINDINGS Alignment:  No vertebral subluxation is observed. Vertebrae: There is evidence of spondylosis and degenerative disc disease in the cervical spine which is not well characterized. Dedicated cervical spine MRI with and without contrast could be utilized if definitive characterization is warranted. Abnormal reduced T1 signal in the T12 vertebral body with fluid signal in the T12-L1 intervertebral disc, endplate irregularities, and paraspinal edema at this level, appearance raises high suspicion for discitis-osteomyelitis. No obvious epidural abscess. Cord:  Unremarkable Paraspinal and other soft tissues: As noted above, there is paraspinal edema at the T12 level. Left kidney upper pole parapelvic cyst. Disc levels: No significant thoracic spine findings above the T7-8 level. T7-8: No impingement.  Right paracentral disc protrusion. T8-9: No impingement.   Right paracentral disc protrusion. T9-10: No impingement. Left paracentral/lateral recess disc protrusion. Right perineural cyst. T10-11: No impingement.  Left facet arthropathy. T11-12: No impingement.  Mild disc bulge. T12-L1: Deferred for the lumbar spine report below. MRI LUMBAR SPINE FINDINGS Segmentation: The lowest lumbar type non-rib-bearing vertebra is labeled as L5. Alignment: 3 mm of degenerative retrolisthesis at L1-2. 3 mm of fused posterior listhesis at L2-3. Vertebrae: Posterolateral rod and pedicle screw fixators with pedicle screws at all levels between L1 and S1 bilaterally. There is inferior endplate concavity at M42 and anterior wedging at L1 with the pedicle screws at L1 potentially capturing the superior cortical surface. Fluid signal at T12-L1 but with only a equivocal enhancement in the intervertebral disc space. Paraspinal edema at this level. No appreciable findings of epidural abscess subject to the limitations caused by motion artifact and metal artifact. Posterior decompression at L4 and L5 with 4.8 by 1.8 by 1.8 cm fluid collection in this vicinity without significant enhancement along its margins. No communication of this fluid collection with the thecal sac is observed. Conus medullaris and cauda equina: Conus extends to the L1 level. Conus and cauda equina appear normal. Paraspinal and other soft tissues: As noted above there is paraspinal edema at the T12-L1 level. Bilateral renal fluid signal intensity lesions favor cysts. Disc levels: T12-L1: Moderate central narrowing of the thecal sac at this level due to disc bulge as well as prominence of the posterior epidural adipose tissues. L1-2: No impingement. Fused level. Posterior intervertebral spurring. Posterior decompression at L1-2. L2-3: No impingement. Fused level. Posterior intervertebral spurring. L3-4: Borderline right foraminal stenosis due to intervertebral spurring. L4-5: Borderline left foraminal stenosis due to  intervertebral spurring. L5-S1: No impingement.  Fused level. IMPRESSION: 1. There several indicators of discitis-osteomyelitis at the T12-L1 level. There is fluid signal in the disc along with considerable edema signal and some enhancement in the vertebral bodies, along with endplate irregularities and compression. There is also paraspinal edema. The only factor unusual for discitis is the relative lack of enhancement in the disc, which is mild if present at all. The a presumptive diagnosis of discitis- osteomyelitis is recommended and consider sampling the disc. No definite epidural abscess. 2. There are findings of degenerative disc disease and spondylosis in the thoracic and lumbar spine, but the only impingement is at the T12-L1 level. At this level, there is moderate central narrowing of  the thecal sac due to disc bulge and prominence of the posterior epidural adipose tissues. 3. Posterolateral rod and pedicle screw fixation extending from L1 through S1. Fluid collection along the posterior decompression at L4-5 does not appear to communicate with the joint and is likely a chronic incidental postoperative collection. Electronically Signed   By: Van Clines M.D.   On: 07/01/2018 14:35   Mr Lumbar Spine W Wo Contrast (assess For Abscess, Cord Compression)  Result Date: 07/01/2018 CLINICAL DATA:  Back pain, possible discitis at T12-L1, for further assessment. EXAM: MRI THORACIC AND LUMBAR SPINE WITHOUT CONTRAST TECHNIQUE: Multiplanar and multiecho pulse sequences of the thoracic and lumbar spine were obtained without intravenous contrast. COMPARISON:  07/01/2018 CT scan, and prior lumbar MRI from 09/29/2011 FINDINGS: Despite efforts by the technologist and patient, severe motion artifact is present on today's exam and could not be eliminated. This reduces exam sensitivity and specificity. MRI THORACIC SPINE FINDINGS Alignment:  No vertebral subluxation is observed. Vertebrae: There is evidence of  spondylosis and degenerative disc disease in the cervical spine which is not well characterized. Dedicated cervical spine MRI with and without contrast could be utilized if definitive characterization is warranted. Abnormal reduced T1 signal in the T12 vertebral body with fluid signal in the T12-L1 intervertebral disc, endplate irregularities, and paraspinal edema at this level, appearance raises high suspicion for discitis-osteomyelitis. No obvious epidural abscess. Cord:  Unremarkable Paraspinal and other soft tissues: As noted above, there is paraspinal edema at the T12 level. Left kidney upper pole parapelvic cyst. Disc levels: No significant thoracic spine findings above the T7-8 level. T7-8: No impingement.  Right paracentral disc protrusion. T8-9: No impingement.  Right paracentral disc protrusion. T9-10: No impingement. Left paracentral/lateral recess disc protrusion. Right perineural cyst. T10-11: No impingement.  Left facet arthropathy. T11-12: No impingement.  Mild disc bulge. T12-L1: Deferred for the lumbar spine report below. MRI LUMBAR SPINE FINDINGS Segmentation: The lowest lumbar type non-rib-bearing vertebra is labeled as L5. Alignment: 3 mm of degenerative retrolisthesis at L1-2. 3 mm of fused posterior listhesis at L2-3. Vertebrae: Posterolateral rod and pedicle screw fixators with pedicle screws at all levels between L1 and S1 bilaterally. There is inferior endplate concavity at M57 and anterior wedging at L1 with the pedicle screws at L1 potentially capturing the superior cortical surface. Fluid signal at T12-L1 but with only a equivocal enhancement in the intervertebral disc space. Paraspinal edema at this level. No appreciable findings of epidural abscess subject to the limitations caused by motion artifact and metal artifact. Posterior decompression at L4 and L5 with 4.8 by 1.8 by 1.8 cm fluid collection in this vicinity without significant enhancement along its margins. No communication of  this fluid collection with the thecal sac is observed. Conus medullaris and cauda equina: Conus extends to the L1 level. Conus and cauda equina appear normal. Paraspinal and other soft tissues: As noted above there is paraspinal edema at the T12-L1 level. Bilateral renal fluid signal intensity lesions favor cysts. Disc levels: T12-L1: Moderate central narrowing of the thecal sac at this level due to disc bulge as well as prominence of the posterior epidural adipose tissues. L1-2: No impingement. Fused level. Posterior intervertebral spurring. Posterior decompression at L1-2. L2-3: No impingement. Fused level. Posterior intervertebral spurring. L3-4: Borderline right foraminal stenosis due to intervertebral spurring. L4-5: Borderline left foraminal stenosis due to intervertebral spurring. L5-S1: No impingement.  Fused level. IMPRESSION: 1. There several indicators of discitis-osteomyelitis at the T12-L1 level. There is fluid signal in the  disc along with considerable edema signal and some enhancement in the vertebral bodies, along with endplate irregularities and compression. There is also paraspinal edema. The only factor unusual for discitis is the relative lack of enhancement in the disc, which is mild if present at all. The a presumptive diagnosis of discitis- osteomyelitis is recommended and consider sampling the disc. No definite epidural abscess. 2. There are findings of degenerative disc disease and spondylosis in the thoracic and lumbar spine, but the only impingement is at the T12-L1 level. At this level, there is moderate central narrowing of the thecal sac due to disc bulge and prominence of the posterior epidural adipose tissues. 3. Posterolateral rod and pedicle screw fixation extending from L1 through S1. Fluid collection along the posterior decompression at L4-5 does not appear to communicate with the joint and is likely a chronic incidental postoperative collection. Electronically Signed   By: Van Clines M.D.   On: 07/01/2018 14:35   Ct Abdomen Pelvis W Contrast  Result Date: 07/01/2018 CLINICAL DATA:  Right upper quadrant abdominal pain. EXAM: CT ABDOMEN AND PELVIS WITH CONTRAST TECHNIQUE: Multidetector CT imaging of the abdomen and pelvis was performed using the standard protocol following bolus administration of intravenous contrast. CONTRAST:  144mL OMNIPAQUE IOHEXOL 300 MG/ML  SOLN COMPARISON:  MR 12/17/2017 . FINDINGS: Lower chest: No acute abnormality. Hepatobiliary: 6 mm cyst within the central liver as noted on previous MRI. No suspicious liver abnormality. Gallbladder sludge and tiny stone identified within the fundus. Stone measures approximately 4 mm. No gallbladder wall thickening or pericholecystic fluid. No biliary ductal dilatation. Pancreas: Unremarkable. No pancreatic ductal dilatation or surrounding inflammatory changes. Spleen: Normal in size without focal abnormality. Adrenals/Urinary Tract: Normal appearance of the adrenal glands. Left kidney cysts are again identified. No kidney mass or hydronephrosis identified bilaterally. Urinary bladder appears normal. Stomach/Bowel: Stomach is normal. The small bowel loops have a normal course and caliber. Extensive distal colonic diverticulosis without acute inflammation. Vascular/Lymphatic: Aortic atherosclerosis. No aneurysm. No abdominopelvic adenopathy identified. Reproductive: Status post hysterectomy. No adnexal masses. Other: No free fluid or fluid collections. Musculoskeletal: Extensive thoracolumbar spondylosis. Previous posterior hardware fixation of L1 through S1. Advanced spondylosis is identified at the T12-L1 level. IMPRESSION: 1. No acute findings within the abdomen or pelvis. 2. Gallbladder sludge and tiny stone noted within the fundus. No secondary signs of acute cholecystitis. No biliary ductal dilatation. 3. Kidney cysts 4.  Aortic Atherosclerosis (ICD10-I70.0). Electronically Signed   By: Kerby Moors M.D.   On:  07/01/2018 08:09   Ct L-spine No Charge  Result Date: 07/01/2018 CLINICAL DATA:  Low back pain EXAM: CT lumbar spine with contrast TECHNIQUE: Multiplanar CT images of the lumbar spine were reconstructed from contemporary CT of the abdomen. CONTRAST:  None additional COMPARISON:  Lumbar radiography 05/16/2018 FINDINGS: Segmentation: 5 lumbar type vertebral bodies Alignment: Unremarkable Vertebrae: L1-S1 fusion. Interbody arthrodesis is solid from L2-S1. Interbody arthrodesis is not clearly demonstrated at L1-2 but there is posterior-lateral arthrodesis on the right at this level and no hardware failure. Endplate sclerosis and lucency at T12-L1, likely degenerative given vacuum phenomenon and appearance on comparison radiography. There is mild haziness of fat about this level needing correlation with labs. Paraspinal and other soft tissues: As above. Abdomen and pelvis CT reported separately Disc levels: T12- L1: Endplate lucency and sclerosis with anterior disc space widening where there is vacuum phenomenon. Degenerative spurring. Mild posterior element hypertrophy. Patent canal and foramina L1-L2: ACDF with posterior-lateral solid arthrodesis. Patent canal after laminectomy  L2-L3: Solid arthrodesis.  No evident impingement. L3-L4: Solid arthrodesis. No evident impingement. L4-L5: Solid arthrodesis. No evident impingement. L5-S1:Solid arthrodesis. No evident impingement. IMPRESSION: 1. Severe adjacent segment disc degeneration at T12-L1. There is mild inflammation of paravertebral fat, please ensure no clinical/laboratory signs of superimposed infection. 2. L1-S1 spinal fusion. Electronically Signed   By: Monte Fantasia M.D.   On: 07/01/2018 08:03     The results of significant diagnostics from this hospitalization (including imaging, microbiology, ancillary and laboratory) are listed below for reference.     Microbiology: Recent Results (from the past 240 hour(s))  Blood culture (routine x 2)      Status: None (Preliminary result)   Collection Time: 07/01/18  8:15 AM  Result Value Ref Range Status   Specimen Description BLOOD LEFT WRIST  Final   Special Requests   Final    BOTTLES DRAWN AEROBIC AND ANAEROBIC Blood Culture results may not be optimal due to an inadequate volume of blood received in culture bottles   Culture   Final    NO GROWTH 1 DAY Performed at [facility], [place] [address] [place], [place] [address]    Report Status PENDING  Incomplete  Blood culture (routine x 2)     Status: None (Preliminary result)   Collection Time: 07/01/18  8:30 AM  Result Value Ref Range Status   Specimen Description BLOOD RIGHT ANTECUBITAL  Final   Special Requests   Final    BOTTLES DRAWN AEROBIC AND ANAEROBIC Blood Culture adequate volume   Culture   Final    NO GROWTH 1 DAY Performed at [facility], [place] [address], [place], [place] [address]    Report Status PENDING  Incomplete     Labs: BNP (last 3 results) No results for input(s): BNP in the last 8760 hours. Basic Metabolic Panel: Recent Labs  Lab 07/01/18 0057 07/02/18 0438  NA 138 139  K 4.2 3.9  CL 104 106  CO2 24 23  GLUCOSE 120* 97  BUN 22 16  CREATININE 1.27* 0.93  CALCIUM 9.2 8.7*   Liver Function Tests: Recent Labs  Lab 07/01/18 0057  AST 16  ALT 5  ALKPHOS 99  BILITOT 0.4  PROT 6.8  ALBUMIN 4.0   Recent Labs  Lab 07/01/18 0057  LIPASE 30   No results for input(s): AMMONIA in the last 168 hours. CBC: Recent Labs  Lab 07/01/18 0057 07/02/18 0438  WBC 5.6 4.4  HGB 12.0 11.7*  HCT 40.3 37.0  MCV 87.8 85.5  PLT 145* 130*   Cardiac Enzymes: No results for input(s): CKTOTAL, CKMB, CKMBINDEX, TROPONINI in the last 168 hours. BNP: Invalid input(s): POCBNP CBG: No results for input(s): GLUCAP in the last 168 hours. D-Dimer No results for input(s): DDIMER in the last 72 hours. Hgb A1c No results for input(s): HGBA1C in the last 72 hours. Lipid Profile No results for  input(s): CHOL, HDL, LDLCALC, TRIG, CHOLHDL, LDLDIRECT in the last 72 hours. Thyroid function studies No results for input(s): TSH, T4TOTAL, T3FREE, THYROIDAB in the last 72 hours.  Invalid input(s): FREET3 Anemia work up No results for input(s): VITAMINB12, FOLATE, FERRITIN, TIBC, IRON, RETICCTPCT in the last 72 hours. Urinalysis    Component Value Date/Time   COLORURINE YELLOW 07/01/2018 0628   APPEARANCEUR CLEAR 07/01/2018 0628   LABSPEC 1.023 07/01/2018 0628   PHURINE 5.0 07/01/2018 0628   GLUCOSEU NEGATIVE 07/01/2018 0628   HGBUR MODERATE (A) 07/01/2018 0628   BILIRUBINUR NEGATIVE 07/01/2018 9470  KETONESUR 5 (A) 07/01/2018 0628   PROTEINUR NEGATIVE 07/01/2018 0628   UROBILINOGEN 1.0 06/26/2010 1009   NITRITE NEGATIVE 07/01/2018 0628   LEUKOCYTESUR TRACE (A) 07/01/2018 0628   Sepsis Labs Invalid input(s): PROCALCITONIN,  WBC,  LACTICIDVEN Microbiology Recent Results (from the past 240 hour(s))  Blood culture (routine x 2)     Status: None (Preliminary result)   Collection Time: 07/01/18  8:15 AM  Result Value Ref Range Status   Specimen Description BLOOD LEFT WRIST  Final   Special Requests   Final    BOTTLES DRAWN AEROBIC AND ANAEROBIC Blood Culture results may not be optimal due to an inadequate volume of blood received in culture bottles   Culture   Final    NO GROWTH 1 DAY Performed at [facility], [place] [address] [place], [place] [address]    Report Status PENDING  Incomplete  Blood culture (routine x 2)     Status: None (Preliminary result)   Collection Time: 07/01/18  8:30 AM  Result Value Ref Range Status   Specimen Description BLOOD RIGHT ANTECUBITAL  Final   Special Requests   Final    BOTTLES DRAWN AEROBIC AND ANAEROBIC Blood Culture adequate volume   Culture   Final    NO GROWTH 1 DAY Performed at [facility], [place] [address], [place], [place] [address]    Report Status PENDING  Incomplete     Time coordinating discharge in minutes:  65  SIGNED:   Debbe Odea, MD  [facility] 07/03/2018, 12:32 PM Pager   If 7PM-7AM, please contact night-coverage [email or web address] Password [email or web address]

## 2018-07-03 NOTE — Progress Notes (Signed)
I have held her discharge as the patient and family refused the leave the hospital until seen by a GI doctor. I have consulted Dr Oletta Lamas. I will follow tomorrow.

## 2018-07-03 NOTE — Consult Note (Addendum)
[facility]       Reason for Consult: ? discitis    Referring Physician: Dr. Wynelle Cleveland  Principal Problem:   Discitis of thoracolumbar region Active Problems:   Osteoarthritis   Obstructive sleep apnea   Atrial fibrillation with RVR ([place])   Chronic anticoagulation -warfarin therapy   Parkinsonism ([place])   . acetaminophen  650 mg Oral Q6H  . acidophilus  1 capsule Oral Daily  . carbidopa-levodopa  2 tablet Oral QID  . chlorzoxazone  500 mg Oral BID  . flecainide  50 mg Oral BID  . gabapentin  300 mg Oral QHS  . latanoprost  1 drop Both Eyes QHS  . lidocaine  1 patch Transdermal Q24H  . metoprolol succinate  100 mg Oral Daily  . pantoprazole  40 mg Oral BID  . potassium chloride SA  40 mEq Oral Daily  . pramipexole  0.5 mg Oral QHS  . pravastatin  40 mg Oral QHS  . senna  1 tablet Oral BID    Recommendations: Observe off of antibiotics Evaluation of GI symptoms - consider ultrasound, though alk phos is wnl.    Assessment: She has had several months of abdominal discomfort, early satiety, bloating and crampy pain radiating to her bilateral flanks.  No associated fever.  CT with a small biliary stone + sludge. MRI was done presumably due to history of back surgery and noted some edema but no disc enhancement.  ESR and CRP wnl, WBC wnl.  She has not had any complaint of back pain in her T12-L1 area or mid spine and on exam is completely benign.  The MRI findings do not correlate with any clinical concerns.   Not consistent with discitis.    Antibiotics: Vancomycin and cefepime x 1 day  HPI: [name] is a 77 y.o. female with a history of lumbar stenosis and radiculopathy s/p decompressive surgery of L1 and L2, pedicle screw fixation by Dr. Luiz Ochoa in 2013 who came in with abdominal pain for several months.  She has noted pain as described above with some radiation to bilateral flank areas.  No associated fever, chills.  MRI findings noted.   No associated weight loss but does eat much smaller meals due to abdominal pain.  No bowel movement over 3 days here.   MRI independently reviewed and no   Review of Systems:  Constitutional: negative for fevers, chills and weight loss Gastrointestinal: positive for change in bowel habits and abdominal pain, negative for nausea and vomiting Musculoskeletal: negative for myalgias and arthralgias Neurological: negative for new paresthesias  All other systems reviewed and are negative    Past Medical History:  Diagnosis Date  . Allergic rhinitis   . Arthritis    "all over"  . CHF (congestive heart failure) ([place])   . Chronic back pain   . DJD (degenerative joint disease)   . GERD (gastroesophageal reflux disease)   . Hyperlipidemia   . Hypertension   . Lumbar stenosis   . OSA (obstructive sleep apnea)    "couldn't handle CPAP; use mouth guard some; not all the time" (01/06/2014)  . PAF (paroxysmal atrial fibrillation) ([place])   . Scoliosis    with radiculopathy L2-S1 with prior surgery  . Small bowel obstruction ([facility])    versus ileus after last bck surgery  . Spondylosis     Social History   Tobacco Use  . Smoking status: Never Smoker  . Smokeless tobacco: Never Used  Substance  Use Topics  . Alcohol use: No    Alcohol/week: 0.0 standard drinks    Comment: 02/01/2014 " glasseof wine once in a blue moon; < once/year"  . Drug use: No    Family History  Problem Relation Age of Onset  . Arthritis Mother   . Heart attack Father   . Hypertension Sister   . Lung cancer Sister   . Asthma Sister   . Allergies Sister     Allergies  Allergen Reactions  . Lyrica [Pregabalin] Other (See Comments)    Felt loopy  . Zocor [Simvastatin] Other (See Comments)    Myalgias  . Relafen [Nabumetone] Rash    Physical Exam: Constitutional: in no apparent distress  Vitals:   07/02/18 1922 07/03/18 0440  BP: (!) 153/55 (!) 161/74  Pulse: 69 63  Resp:    Temp: 98.4 F (36.9 C)     SpO2: 99% 97%   EYES: anicteric ENMT: no thrush Cardiovascular: Cor RRR Respiratory: CTA B; normal respiratory effort GI: Bowel sounds are normal, liver is not enlarged, spleen is not enlarged Musculoskeletal: no pedal edema noted Skin: negatives: no rash Heme: no cervical lad  Lab Results  Component Value Date   WBC 4.4 07/02/2018   HGB 11.7 (L) 07/02/2018   HCT 37.0 07/02/2018   MCV 85.5 07/02/2018   PLT 130 (L) 07/02/2018    Lab Results  Component Value Date   CREATININE 0.93 07/02/2018   BUN 16 07/02/2018   NA 139 07/02/2018   K 3.9 07/02/2018   CL 106 07/02/2018   CO2 23 07/02/2018    Lab Results  Component Value Date   ALT 5 07/01/2018   AST 16 07/01/2018   ALKPHOS 99 07/01/2018     Microbiology: Recent Results (from the past 240 hour(s))  Blood culture (routine x 2)     Status: None (Preliminary result)   Collection Time: 07/01/18  8:15 AM  Result Value Ref Range Status   Specimen Description BLOOD LEFT WRIST  Final   Special Requests   Final    BOTTLES DRAWN AEROBIC AND ANAEROBIC Blood Culture results may not be optimal due to an inadequate volume of blood received in culture bottles   Culture   Final    NO GROWTH 1 DAY Performed at [facility] [address], [place], [place] [address]    Report Status PENDING  Incomplete  Blood culture (routine x 2)     Status: None (Preliminary result)   Collection Time: 07/01/18  8:30 AM  Result Value Ref Range Status   Specimen Description BLOOD RIGHT ANTECUBITAL  Final   Special Requests   Final    BOTTLES DRAWN AEROBIC AND ANAEROBIC Blood Culture adequate volume   Culture   Final    NO GROWTH 1 DAY Performed at [facility], [place] [address] [place], [place] [address]    Report Status PENDING  Incomplete    Thayer Headings, [place] [facility] [place] [email or web address] 07/03/2018, 10:00 AM

## 2018-07-04 ENCOUNTER — Inpatient Hospital Stay (HOSPITAL_COMMUNITY): Payer: Medicare Other

## 2018-07-04 MED ORDER — WARFARIN SODIUM 2.5 MG PO TABS
2.5000 mg | ORAL_TABLET | Freq: Once | ORAL | Status: DC
  Filled 2018-07-04 (×2): qty 1

## 2018-07-04 MED ORDER — TECHNETIUM TC 99M MEBROFENIN IV KIT
5.3000 | PACK | Freq: Once | INTRAVENOUS | Status: AC | PRN
  Administered 2018-07-04: 5.3 via INTRAVENOUS

## 2018-07-04 NOTE — Progress Notes (Signed)
PROGRESS NOTE    [name]   ZOX:[id number]  DOB: 1941-02-26  DOA: 07/01/2018 PCP: [name], MD   Brief Narrative:  [name] is a 78 y.o. female with medical history significant of Parkinson's disease, congestive heart failure with preserved ejection fraction, chronic back pain, degenerative joint disease, gastroesophageal reflux disease, hyperlipidemia, hypertension and paroxysmal atrial fibrillation on warfarin who presents for evaluation of worsening onset of progressively worsening low back pain and abdominal pain which has been present for at least 3 months.    Thoracolumbar MRI> several indicators of discitis-osteomyelitis at the T12-L1 level. The patient was started on VAnc and Cefepime and admitted.  Subjective: Has back pain- points to area behind right hip. No nausea. On and off abdominal distension.    Assessment & Plan:   Principal Problem:  Chronic back pain Discitis of thoracolumbar region? - s/p multiple back surgeries by neuro surgery- pain appears to be chronic to me ? Discitison MRI- patient is not tender in the area of supposed discitis- I discontinued the antibiotics and asked for a Neurosurgery and ID opinion - the conclusion is that it is not likely she has a discitis and at this time does not need further work up    - in regards to her chronic back-she states she was prescribed PT which exacerbated this (thus no PT ordered again) - she states the pain moves around and on exam today she is tender in lumbar/flank areas bilaterally- there is no radiation down her legs and no other neurological symptoms - there is moderate thoracic stenosis on the MRI which I have discussed with NS- NS does not feel it needs surgery or further inpatient management   - it appears to be muscular pain on my exam and I have started Voltaren gel   Active Problems:  Abdominal bloating and pain when eating - the patient and her daughter have asked me  for a GI consult- this is a chronic issue that she saw Dr Cristina Gong for last month but apparently has not yet resolved-  - I consulted GI- her pain does not seem consistent with gallbladder disease but a HIDA scan was ordered- it shows a low EF- General surgery consulted-      Obstructive sleep apnea - does not use CPAP    Atrial fibrillation with RVR - cont Flecainide - coumadin per pharmacy  Thrombocytopenia - mild, appears to be chronic   Parkinsonism -cont Sinemet   Time spent in minutes: 35 DVT prophylaxis: INR therapeutic Code Status: Full code Family Communication:  Disposition Plan: f/u NS eval Consultants:   NS Procedures:   none Antimicrobials:  Anti-infectives (From admission, onward)   Start     Dose/Rate Route Frequency Ordered Stop   07/03/18 0400  vancomycin (VANCOCIN) 1,500 mg in sodium chloride 0.9 % 500 mL IVPB  Status:  Discontinued     1,500 mg 250 mL/hr over 120 Minutes Intravenous Every 36 hours 07/01/18 1540 07/02/18 0912   07/01/18 1545  ceFEPIme (MAXIPIME) 1 g in sodium chloride 0.9 % 100 mL IVPB  Status:  Discontinued     1 g 200 mL/hr over 30 Minutes Intravenous Every 12 hours 07/01/18 1540 07/02/18 0912   07/01/18 1545  vancomycin (VANCOCIN) 1,750 mg in sodium chloride 0.9 % 500 mL IVPB     1,750 mg 250 mL/hr over 120 Minutes Intravenous  Once 07/01/18 1540 07/02/18 1820       Objective: Vitals:   07/03/18 2332 07/04/18 0459 07/04/18  0919 07/04/18 1318  BP: 106/77 (!) 177/83 (!) 146/68 (!) 130/54  Pulse: 66 70 60 60  Resp: 18 20    Temp: 98 F (36.7 C) 98.1 F (36.7 C)  (!) 97.5 F (36.4 C)  TempSrc: Oral Oral  Oral  SpO2: 97% 100%  91%  Weight:      Height:        Intake/Output Summary (Last 24 hours) at 07/04/2018 1756 Last data filed at 07/04/2018 1500 Gross per 24 hour  Intake 240 ml  Output 350 ml  Net -110 ml   Filed Weights   07/01/18 1800 07/01/18 2300  Weight: 84.8 kg 84 kg    Examination: General exam: Appears  comfortable  HEENT: PERRLA, oral mucosa moist, no sclera icterus or thrush Respiratory system: Clear to auscultation. Respiratory effort normal. Cardiovascular system: S1 & S2 heard,  No murmurs  Gastrointestinal system: Abdomen soft, tender in lower abdomen similar to yesterday, nondistended. Normal bowel sound. No organomegaly MSK: tender in outer right hip Central nervous system: Alert and oriented. No focal neurological deficits. Extremities: No cyanosis, clubbing or edema Skin: No rashes or ulcers Psychiatry:  flat affect   Data Reviewed: I have personally reviewed following labs and imaging studies  CBC: Recent Labs  Lab 07/01/18 0057 07/02/18 0438  WBC 5.6 4.4  HGB 12.0 11.7*  HCT 40.3 37.0  MCV 87.8 85.5  PLT 145* 341*   Basic Metabolic Panel: Recent Labs  Lab 07/01/18 0057 07/02/18 0438  NA 138 139  K 4.2 3.9  CL 104 106  CO2 24 23  GLUCOSE 120* 97  BUN 22 16  CREATININE 1.27* 0.93  CALCIUM 9.2 8.7*   GFR: Estimated Creatinine Clearance: 55.9 mL/min (by C-G formula based on SCr of 0.93 mg/dL). Liver Function Tests: Recent Labs  Lab 07/01/18 0057  AST 16  ALT 5  ALKPHOS 99  BILITOT 0.4  PROT 6.8  ALBUMIN 4.0   Recent Labs  Lab 07/01/18 0057  LIPASE 30   No results for input(s): AMMONIA in the last 168 hours. Coagulation Profile: Recent Labs  Lab 06/28/18 1434 07/01/18 0830 07/02/18 0438 07/03/18 0645  INR 3.6* 2.34 2.06 1.60   Cardiac Enzymes: No results for input(s): CKTOTAL, CKMB, CKMBINDEX, TROPONINI in the last 168 hours. BNP (last 3 results) No results for input(s): PROBNP in the last 8760 hours. HbA1C: No results for input(s): HGBA1C in the last 72 hours. CBG: No results for input(s): GLUCAP in the last 168 hours. Lipid Profile: No results for input(s): CHOL, HDL, LDLCALC, TRIG, CHOLHDL, LDLDIRECT in the last 72 hours. Thyroid Function Tests: No results for input(s): TSH, T4TOTAL, FREET4, T3FREE, THYROIDAB in the last 72  hours. Anemia Panel: No results for input(s): VITAMINB12, FOLATE, FERRITIN, TIBC, IRON, RETICCTPCT in the last 72 hours. Urine analysis:    Component Value Date/Time   COLORURINE YELLOW 07/01/2018 0628   APPEARANCEUR CLEAR 07/01/2018 0628   LABSPEC 1.023 07/01/2018 0628   PHURINE 5.0 07/01/2018 0628   GLUCOSEU NEGATIVE 07/01/2018 0628   HGBUR MODERATE (A) 07/01/2018 0628   BILIRUBINUR NEGATIVE 07/01/2018 0628   KETONESUR 5 (A) 07/01/2018 0628   PROTEINUR NEGATIVE 07/01/2018 0628   UROBILINOGEN 1.0 06/26/2010 1009   NITRITE NEGATIVE 07/01/2018 0628   LEUKOCYTESUR TRACE (A) 07/01/2018 0628   Sepsis Labs: @LABRCNTIP (procalcitonin:4,lacticidven:4) ) Recent Results (from the past 240 hour(s))  Blood culture (routine x 2)     Status: None (Preliminary result)   Collection Time: 07/01/18  8:15 AM  Result Value  Ref Range Status   Specimen Description BLOOD LEFT WRIST  Final   Special Requests   Final    BOTTLES DRAWN AEROBIC AND ANAEROBIC Blood Culture results may not be optimal due to an inadequate volume of blood received in culture bottles   Culture   Final    NO GROWTH 3 DAYS Performed at [facility], [place] [address], [place], [place] [address]    Report Status PENDING  Incomplete  Blood culture (routine x 2)     Status: None (Preliminary result)   Collection Time: 07/01/18  8:30 AM  Result Value Ref Range Status   Specimen Description BLOOD RIGHT ANTECUBITAL  Final   Special Requests   Final    BOTTLES DRAWN AEROBIC AND ANAEROBIC Blood Culture adequate volume   Culture   Final    NO GROWTH 3 DAYS Performed at [facility], [place] [address] [place], [place] [address]    Report Status PENDING  Incomplete         Radiology Studies: Nm Hepato W/eject Fract  Result Date: 07/04/2018 CLINICAL DATA:  Right upper quadrant abdominal pain. EXAM: NUCLEAR MEDICINE HEPATOBILIARY IMAGING WITH GALLBLADDER EF TECHNIQUE: Sequential images of the abdomen were obtained out  to 60 minutes following intravenous administration of radiopharmaceutical. After oral ingestion of Ensure, gallbladder ejection fraction was determined. At 60 min, normal ejection fraction is greater than 33%. RADIOPHARMACEUTICALS:  5.3 mCi Tc-39m  Choletec IV COMPARISON:  CT scan of July 01, 2018. FINDINGS: Prompt uptake and biliary excretion of activity by the liver is seen. Gallbladder activity is visualized, consistent with patency of cystic duct. Biliary activity passes into small bowel, consistent with patent common bile duct. Calculated gallbladder ejection fraction is 24%, which is below normal. Patient did report discomfort in her back after ingesting Ensure. (Normal gallbladder ejection fraction with Ensure is greater than 33%.) IMPRESSION: Gallbladder ejection fraction of 24% was noted after Ensure administration, which is below normal and concerning for biliary dyskinesis. Electronically Signed   By: Marijo Conception, M.D.   On: 07/04/2018 13:54      Scheduled Meds: . acetaminophen  650 mg Oral Q6H  . acidophilus  1 capsule Oral Daily  . carbidopa-levodopa  2 tablet Oral QID  . chlorzoxazone  500 mg Oral BID  . dicyclomine  20 mg Oral TID AC  . flecainide  50 mg Oral BID  . gabapentin  300 mg Oral QHS  . latanoprost  1 drop Both Eyes QHS  . lidocaine  1 patch Transdermal Q24H  . metoprolol succinate  100 mg Oral Daily  . pantoprazole  40 mg Oral BID  . polyethylene glycol  17 g Oral BID  . potassium chloride SA  40 mEq Oral Daily  . pramipexole  0.5 mg Oral QHS  . pravastatin  40 mg Oral QHS  . senna  1 tablet Oral BID  . warfarin  2.5 mg Oral ONCE-1800  . Warfarin - Pharmacist Dosing Inpatient   Does not apply q1800   Continuous Infusions:    LOS: 3 days      Debbe Odea, MD [facility] Pager: [email or web address] Password [facility] 07/04/2018, 5:56 PM

## 2018-07-04 NOTE — Progress Notes (Signed)
[facility] PROGRESS NOTE Subjective Patient continues to have pain.  Description very vague.  The pain seems to be all over her abdomen but mostly right-sided and from the back.  She still continuing to have severe back pain as well as abdominal pain.  Objective: Vital signs in last 24 hours: Temp:  [97.5 F (36.4 C)-98.1 F (36.7 C)] 97.5 F (36.4 C) (01/28 1318) Pulse Rate:  [60-70] 60 (01/28 1318) Resp:  [18-20] 20 (01/28 0459) BP: (106-177)/(54-83) 130/54 (01/28 1318) SpO2:  [91 %-100 %] 91 % (01/28 1318) Last BM Date: 06/30/18  Intake/Output from previous day: 01/27 0701 - 01/28 0700 In: 720 [P.O.:720] Out: -  Intake/Output this shift: No intake/output data recorded.  PE: General--patient slowly got back in bed has obvious back pain  Abdomen--nondistended good bowel sounds with mild upper tenderness right seems to be more than left.  Lab Results: Recent Labs    07/02/18 0438  WBC 4.4  HGB 11.7*  HCT 37.0  PLT 130*   BMET Recent Labs    07/02/18 0438  NA 139  K 3.9  CL 106  CO2 23  CREATININE 0.93   LFT No results for input(s): PROT, AST, ALT, ALKPHOS, BILITOT, BILIDIR, IBILI in the last 72 hours. PT/INR Recent Labs    07/02/18 0438 07/03/18 0645  LABPROT 22.9* 18.9*  INR 2.06 1.60   PANCREAS No results for input(s): LIPASE in the last 72 hours.       Studies/Results: Nm Hepato W/eject Fract  Result Date: 07/04/2018 CLINICAL DATA:  Right upper quadrant abdominal pain. EXAM: NUCLEAR MEDICINE HEPATOBILIARY IMAGING WITH GALLBLADDER EF TECHNIQUE: Sequential images of the abdomen were obtained out to 60 minutes following intravenous administration of radiopharmaceutical. After oral ingestion of Ensure, gallbladder ejection fraction was determined. At 60 min, normal ejection fraction is greater than 33%. RADIOPHARMACEUTICALS:  5.3 mCi Tc-68m  Choletec IV COMPARISON:  CT scan of July 01, 2018. FINDINGS: Prompt uptake and biliary  excretion of activity by the liver is seen. Gallbladder activity is visualized, consistent with patency of cystic duct. Biliary activity passes into small bowel, consistent with patent common bile duct. Calculated gallbladder ejection fraction is 24%, which is below normal. Patient did report discomfort in her back after ingesting Ensure. (Normal gallbladder ejection fraction with Ensure is greater than 33%.) IMPRESSION: Gallbladder ejection fraction of 24% was noted after Ensure administration, which is below normal and concerning for biliary dyskinesis. Electronically Signed   By: Marijo Conception, M.D.   On: 07/04/2018 13:54    Medications: I have reviewed the patient's current medications.  Assessment:   1.  Abdominal pain.  The patient has had a very extensive work-up over the past several months as noted in my original consult note.  She has the issue of severe back pain as well but does not appear to have an infected disc.  Her studies have shown some sludge in the gallbladder with a diminished ejection fraction of 24%.  Unfortunately, her description of her symptoms is so vague that it is really hard to know if this is truly her gallbladder wall coming from her back.   Plan: 1.  I would continue her on MiraLAX and dicyclomine for now. 2.  Would obtain surgical consult to see if they feel cholecystectomy indicated.  One option would be to go ahead and do it or send her home for a couple weeks and see how she does.   Nancy Fetter 07/04/2018, 2:53 PM  This note  was created using voice recognition software. Minor errors may Have occurred unintentionally.  Pager: [phone] If no answer or after hours call [phone]

## 2018-07-04 NOTE — Progress Notes (Signed)
1030: pt transported to nuclear medicine for imaging.   1318: pt returned to room 5N12. Will continue to monitor.

## 2018-07-04 NOTE — Progress Notes (Signed)
ANTICOAGULATION CONSULT NOTE - Initial Consult  Pharmacy Consult for warfarin Indication: atrial fibrillation  Allergies  Allergen Reactions  . Lyrica [Pregabalin] Other (See Comments)    Felt loopy  . Zocor [Simvastatin] Other (See Comments)    Myalgias  . Relafen [Nabumetone] Rash    Patient Measurements: Height: 5\' 5"  (165.1 cm) Weight: 185 lb 3 oz (84 kg) IBW/kg (Calculated) : 57  Vital Signs: Temp: 97.5 F (36.4 C) (01/28 1318) Temp Source: Oral (01/28 1318) BP: 130/54 (01/28 1318) Pulse Rate: 60 (01/28 1318)  Labs: Recent Labs    07/02/18 0438 07/03/18 0645  HGB 11.7*  --   HCT 37.0  --   PLT 130*  --   LABPROT 22.9* 18.9*  INR 2.06 1.60  CREATININE 0.93  --     Estimated Creatinine Clearance: 55.9 mL/min (by C-G formula based on SCr of 0.93 mg/dL).   Medical History: Past Medical History:  Diagnosis Date  . Allergic rhinitis   . Arthritis    "all over"  . CHF (congestive heart failure) ([place])   . Chronic back pain   . DJD (degenerative joint disease)   . GERD (gastroesophageal reflux disease)   . Hyperlipidemia   . Hypertension   . Lumbar stenosis   . OSA (obstructive sleep apnea)    "couldn't handle CPAP; use mouth guard some; not all the time" (01/06/2014)  . PAF (paroxysmal atrial fibrillation) ([place])   . Scoliosis    with radiculopathy L2-S1 with prior surgery  . Small bowel obstruction ([facility])    versus ileus after last bck surgery  . Spondylosis     Medications:  Medications Prior to Admission  Medication Sig Dispense Refill Last Dose  . acetaminophen (TYLENOL) 325 MG tablet Take 325 mg by mouth daily as needed for mild pain (pain).    Past Week at prn  . calcium carbonate (TUMS - DOSED IN MG ELEMENTAL CALCIUM) 500 MG chewable tablet Chew 1 tablet by mouth as needed for indigestion or heartburn.    06/17/2018  . carbidopa-levodopa (SINEMET IR) 25-100 MG tablet Take 2 tablets by mouth 4 (four) times daily. 720 tablet 1 06/30/2018 at Unknown  time  . chlorzoxazone (PARAFON) 500 MG tablet Take 500 mg by mouth 2 (two) times daily.    06/29/2018  . fexofenadine (ALLEGRA) 180 MG tablet Take 180 mg by mouth as needed. For allergies   06/10/2018 at prn  . flecainide (TAMBOCOR) 50 MG tablet TAKE 1 TABLET BY MOUTH 2 TIMES DAILY (Patient taking differently: Take 50 mg by mouth 2 (two) times daily. ) 180 tablet 2 06/30/2018 at Unknown time  . furosemide (LASIX) 20 MG tablet Take 40 mg by mouth as needed.    Past Month at prn  . gabapentin (NEURONTIN) 300 MG capsule Take 300 mg by mouth at bedtime.   06/30/2018 at Unknown time  . metoprolol succinate (TOPROL-XL) 100 MG 24 hr tablet TAKE 1 TABLET BY MOUTH DAILY (Patient taking differently: Take 100 mg by mouth daily. ) 90 tablet 2 06/30/2018 at 0800   . oxyCODONE-acetaminophen (PERCOCET/ROXICET) 5-325 MG tablet Take 1-2 tablets by mouth every 6 (six) hours as needed.   06/30/2018 at prn  . pantoprazole (PROTONIX) 40 MG tablet Take 40 mg by mouth 2 (two) times daily.   06/30/2018 at Unknown time  . potassium chloride SA (K-DUR,KLOR-CON) 20 MEQ tablet Take 40 mEq by mouth daily.    06/30/2018 at Unknown time  . pramipexole (MIRAPEX) 0.5 MG tablet Take 0.5-1 mg  by mouth at bedtime. For restless legs   06/29/2018  . pravastatin (PRAVACHOL) 40 MG tablet Take 40 mg by mouth at bedtime.    06/30/2018 at Unknown time  . Probiotic Product (PROBIOTIC-10 PO) Take by mouth daily.   06/30/2018 at Unknown time  . TRAVATAN Z 0.004 % SOLN ophthalmic solution Place 1 drop into both eyes at bedtime.   0 06/30/2018 at Unknown time  . warfarin (COUMADIN) 2.5 MG tablet Take 1 tablet (2.5 mg total) by mouth as directed. (Patient taking differently: Take 2.5 mg by mouth See admin instructions. Hold on Tuesday and Thursday Take 2.5 mg all the other days) 90 tablet 0 06/30/2018 at 2000    Assessment: 32 YOF with h/o Afib on warfarin admitted for worsening low back pain and abdominal pain. Patient was scheduled for discharge which is  now delayed for GI evaluation. Pharmacy consulted to dose warfarin tonight.   INR subtherapeutic at 1.6 yesterday, no INR today. CBC reviewed from 1/26. Patient is not experiencing any signs/symptoms of bleeding. Patient unsure of home dose, and conflicts with most recent [facility] note. Will resume dosing per [facility]in Clinic note.   Home warfarin dose per patient: No dose on Tue/Thu; 2.5 mg on all other day  Home warfarin dose per Cou[facility]in Clinic: 1.25mg  on Tue/Thu; 2.5 mg on all other day   Goal of Therapy:  INR 2-3 Monitor platelets by anticoagulation protocol: Yes   Plan:  -Warfarin 2.5 mg x 1 dose tonight -PT/INR tomorrow  -Monitor for signs/symptoms of bleeding   Azzie Roup D PGY1 Pharmacy Resident  Phone [phone] Please use AMION for clinical pharmacists numbers  07/04/2018      2:25 PM

## 2018-07-05 DIAGNOSIS — I480 Paroxysmal atrial fibrillation: Secondary | ICD-10-CM

## 2018-07-05 DIAGNOSIS — Z01810 Encounter for preprocedural cardiovascular examination: Secondary | ICD-10-CM

## 2018-07-05 DIAGNOSIS — G4733 Obstructive sleep apnea (adult) (pediatric): Secondary | ICD-10-CM

## 2018-07-05 LAB — CBC WITH DIFFERENTIAL/PLATELET
Abs Immature Granulocytes: 0.02 10*3/uL (ref 0.00–0.07)
Basophils Absolute: 0 10*3/uL (ref 0.0–0.1)
Basophils Relative: 1 %
Eosinophils Absolute: 0.1 10*3/uL (ref 0.0–0.5)
Eosinophils Relative: 2 %
HCT: 42.7 % (ref 36.0–46.0)
Hemoglobin: 13.6 g/dL (ref 12.0–15.0)
Immature Granulocytes: 0 %
Lymphocytes Relative: 16 %
Lymphs Abs: 0.8 10*3/uL (ref 0.7–4.0)
MCH: 27.3 pg (ref 26.0–34.0)
MCHC: 31.9 g/dL (ref 30.0–36.0)
MCV: 85.7 fL (ref 80.0–100.0)
Monocytes Absolute: 0.4 10*3/uL (ref 0.1–1.0)
Monocytes Relative: 8 %
Neutro Abs: 3.7 10*3/uL (ref 1.7–7.7)
Neutrophils Relative %: 73 %
Platelets: 161 10*3/uL (ref 150–400)
RBC: 4.98 MIL/uL (ref 3.87–5.11)
RDW: 15.6 % — ABNORMAL HIGH (ref 11.5–15.5)
WBC: 5 10*3/uL (ref 4.0–10.5)
nRBC: 0 % (ref 0.0–0.2)

## 2018-07-05 LAB — COMPREHENSIVE METABOLIC PANEL
ALT: <5 U/L (ref 0–44)
AST: 18 U/L (ref 15–41)
Albumin: 3.9 g/dL (ref 3.5–5.0)
Alkaline Phosphatase: 93 U/L (ref 38–126)
Anion gap: 7 (ref 5–15)
BUN: 23 mg/dL (ref 8–23)
CO2: 26 mmol/L (ref 22–32)
Calcium: 9.6 mg/dL (ref 8.9–10.3)
Chloride: 107 mmol/L (ref 98–111)
Creatinine, Ser: 0.92 mg/dL (ref 0.44–1.00)
GFR calc Af Amer: >60 mL/min (ref >60)
GFR calc non Af Amer: >60 mL/min (ref >60)
Glucose, Bld: 97 mg/dL (ref 70–99)
Potassium: 4.2 mmol/L (ref 3.5–5.1)
Sodium: 140 mmol/L (ref 135–145)
Total Bilirubin: 0.7 mg/dL (ref 0.3–1.2)
Total Protein: 7.2 g/dL (ref 6.5–8.1)

## 2018-07-05 LAB — PROTIME-INR
INR: 1.27
Prothrombin Time: 15.8 seconds — ABNORMAL HIGH (ref 11.4–15.2)

## 2018-07-05 LAB — MAGNESIUM: Magnesium: 2 mg/dL (ref 1.7–2.4)

## 2018-07-05 LAB — PHOSPHORUS: Phosphorus: 3.2 mg/dL (ref 2.5–4.6)

## 2018-07-05 MED ORDER — HEPARIN BOLUS VIA INFUSION
4000.0000 [IU] | Freq: Once | INTRAVENOUS | Status: AC
  Administered 2018-07-05: 4000 [IU] via INTRAVENOUS
  Filled 2018-07-05: qty 4000

## 2018-07-05 MED ORDER — HEPARIN (PORCINE) 25000 UT/250ML-% IV SOLN
800.0000 [IU]/h | INTRAVENOUS | Status: DC
  Administered 2018-07-05: 1200 [IU]/h via INTRAVENOUS
  Filled 2018-07-05 (×3): qty 250

## 2018-07-05 MED ORDER — WARFARIN SODIUM 4 MG PO TABS
4.0000 mg | ORAL_TABLET | Freq: Once | ORAL | Status: DC
  Filled 2018-07-05: qty 1

## 2018-07-05 NOTE — Care Management Important Message (Signed)
Important Message  Patient Details  Name: [name] MRN: [id number] Date of Birth: [birth year]   Medicare Important Message Given:  Yes    Orbie Pyo 07/05/2018, 4:21 PM

## 2018-07-05 NOTE — Consult Note (Signed)
[name] 1940-09-05  [id number].    Requesting MD: Dr. Wynelle Cleveland Chief Complaint/Reason for Consult: Abdominal Pain/Biliary Dyskinesia   HPI:  This is a 78 y.o. female with a history of PAF on Coumadin, CHF, hypertension and hyperlipidemia.  Patient reports that she first began noticing abdominal pain in July 2019.  She reports that this initially was intermittent, and would occur every few days/weeks.  The pain commonly occured after eating, and would be located in her lower abdomen, periumbilical area and her lower back. Over the last few months, maybe 3, this pain has become more frequent, now occurring daily, for most of the day. Her symptoms have become more vague. Her pain will be in different areas on different days. It is most commonly in her lower abdomen and lower back but some days will be in her mid epigastrium, RUQ and LUQ. The pain is still worsened after eating, but now also worsened with walking/movement and when trying to stand up straight. She has tried PPI's, gabapentin, oxycodone and OTC pain medication for this without much relief. She does have occasional nausea but is unsure if this is related to her pain. No fevers, chills or emesis. She notes she deals with on/off constipation. She will go 3 days without BM's and than have episodes of loose watery stools. She is taking a probiotic for this which has seemed to help. She had a normal BM yesterday without blood or melena. Her normal diet includes red meat, fried chicken, potatoes etc. The patient came to the ED on 1/25 where she was admitted for concerns of discitis of T12-L1. She was seen by ID who did not feel this was infectious in nature and abx were stopped. The patient began having increasing abdominal pain during her stay and GI was consulted for evaluation. She has undergone a CT of the abdomen during admission that showed gallbladder sludge without signs of cholecystitis.  Her WBC, LFTs and bili have been normal  since admission.  Patient underwent a HIDA scan that showed concerns for biliary dyskinesia with gallbladder ejection fraction of 24%.  Patient did complain of back discomfort after ingesting Ensure.  The cystic duct was patent. We were asked to consult.   ROS: Review of Systems  Constitutional: Negative for chills and fever.  HENT: Negative.   Eyes: Negative.   Respiratory: Negative for cough and shortness of breath.   Cardiovascular: Negative.  Negative for chest pain, leg swelling and PND.  Gastrointestinal: Positive for abdominal pain and nausea. Negative for vomiting.  Genitourinary: Negative.   Musculoskeletal: Positive for back pain.  Skin: Negative.   Neurological: Negative for weakness.  All other systems reviewed and are negative.   Family History  Problem Relation Age of Onset  . Arthritis Mother   . Heart attack Father   . Hypertension Sister   . Lung cancer Sister   . Asthma Sister   . Allergies Sister     Past Medical History:  Diagnosis Date  . Allergic rhinitis   . Arthritis    "all over"  . CHF (congestive heart failure) ([place])   . Chronic back pain   . DJD (degenerative joint disease)   . GERD (gastroesophageal reflux disease)   . Hyperlipidemia   . Hypertension   . Lumbar stenosis   . OSA (obstructive sleep apnea)    "couldn't handle CPAP; use mouth guard some; not all the time" (01/06/2014)  . PAF (paroxysmal atrial fibrillation) ([place])   . 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 Small bowel obstruction ([facility])    versus ileus after last bck surgery  . Spondylosis     Past Surgical History:  Procedure Laterality Date  . BACK SURGERY    . CATARACT EXTRACTION W/ INTRAOCULAR LENS  IMPLANT, BILATERAL  2011  . COLONOSCOPY  12/09/2004  . LIPOMA EXCISION  1980's   "fatty tumors"  . [place] SURGERY  02/2009   "ruptured disc"  . NASAL SEPTUM SURGERY  80's  . POSTERIOR LUMBAR FUSION  06/2010; 10/2011   "placed screws, rods, spacers  both times"  . REPAIR DURAL / CSF LEAK  02/2009  . TOTAL ABDOMINAL HYSTERECTOMY  02/1993    Social History:  reports that she has never smoked. She has never used smokeless tobacco. She reports that she does not drink alcohol or use drugs.  Allergies:  Allergies  Allergen Reactions  . Lyrica [Pregabalin] Other (See Comments)    Felt loopy  . Zocor [Simvastatin] Other (See Comments)    Myalgias  . Relafen [Nabumetone] Rash    Medications Prior to Admission  Medication Sig Dispense Refill  . acetaminophen (TYLENOL) 325 MG tablet Take 325 mg by mouth daily as needed for mild pain (pain).     . calcium carbonate (TUMS - DOSED IN MG ELEMENTAL CALCIUM) 500 MG chewable tablet Chew 1 tablet by mouth as needed for indigestion or heartburn.     . carbidopa-levodopa (SINEMET IR) 25-100 MG tablet Take 2 tablets by mouth 4 (four) times daily. 720 tablet 1  . chlorzoxazone (PARAFON) 500 MG tablet Take 500 mg by mouth 2 (two) times daily.     . fexofenadine (ALLEGRA) 180 MG tablet Take 180 mg by mouth as needed. For allergies    . flecainide (TAMBOCOR) 50 MG tablet TAKE 1 TABLET BY MOUTH 2 TIMES DAILY (Patient taking differently: Take 50 mg by mouth 2 (two) times daily. ) 180 tablet 2  . furosemide (LASIX) 20 MG tablet Take 40 mg by mouth as needed.     . gabapentin (NEURONTIN) 300 MG capsule Take 300 mg by mouth at bedtime.    . metoprolol succinate (TOPROL-XL) 100 MG 24 hr tablet TAKE 1 TABLET BY MOUTH DAILY (Patient taking differently: Take 100 mg by mouth daily. ) 90 tablet 2  . oxyCODONE-acetaminophen (PERCOCET/ROXICET) 5-325 MG tablet Take 1-2 tablets by mouth every 6 (six) hours as needed.    . pantoprazole (PROTONIX) 40 MG tablet Take 40 mg by mouth 2 (two) times daily.    . potassium chloride SA (K-DUR,KLOR-CON) 20 MEQ tablet Take 40 mEq by mouth daily.     . pramipexole (MIRAPEX) 0.5 MG tablet Take 0.5-1 mg by mouth at bedtime. For restless legs    . pravastatin (PRAVACHOL) 40 MG tablet Take  40 mg by mouth at bedtime.     . Probiotic Product (PROBIOTIC-10 PO) Take by mouth daily.    . TRAVATAN Z 0.004 % SOLN ophthalmic solution Place 1 drop into both eyes at bedtime.   0  . warfarin (COUMADIN) 2.5 MG tablet Take 1 tablet (2.5 mg total) by mouth as directed. (Patient taking differently: Take 2.5 mg by mouth See admin instructions. Hold on Tuesday and Thursday Take 2.5 mg all the other days) 90 tablet 0     Physical Exam: Blood pressure (!) 152/65, pulse 62, temperature 97.9 F (36.6 C), temperature source Oral, resp. rate 12, height 5\' 5"  (1.651 m), weight 84 kg, SpO2 98 %. General: pleasant,  WD, WN white female who is sitting up in chair in NAD HEENT: head is normocephalic, atraumatic.  Sclera are noninjected. No scleral icterus. PERRL.  Ears and nose without any masses or lesions.  Mouth is pink and moist Heart: regular, rate, and rhythm.  Normal s1,s2. No obvious murmurs, gallops, or rubs noted.  Palpable radial and pedal pulses bilaterally Lungs: CTAB, no wheezes, rhonchi, or rales noted.  Respiratory effort nonlabored Abd: Soft, ND, +BS. The patient has generalized tenderness that is mildly worse in the RUQ. No r/r/g. No masses, hernias, or organomegaly MS: all 4 extremities are symmetrical with no cyanosis, clubbing, or edema. No tenderness of C, T, L spinous processes. B/l lumbar paraspinal muscles. No pain with passive rom of the hips. SLR b/l does not reproduce symptoms.  Skin: warm and dry with no masses, lesions, or rashes Psych: A&Ox3 with an appropriate affect.   Results for orders placed or performed during the hospital encounter of 07/01/18 (from the past 48 hour(s))  Protime-INR     Status: Abnormal   Collection Time: 07/05/18  3:51 AM  Result Value Ref Range   Prothrombin Time 15.8 (H) 11.4 - 15.2 seconds   INR 1.27     Comment: Performed at [facility] [address] [place], [place] [address]   Nm Hepato W/eject Fract  Result Date:  07/04/2018 CLINICAL DATA:  Right upper quadrant abdominal pain. EXAM: NUCLEAR MEDICINE HEPATOBILIARY IMAGING WITH GALLBLADDER EF TECHNIQUE: Sequential images of the abdomen were obtained out to 60 minutes following intravenous administration of radiopharmaceutical. After oral ingestion of Ensure, gallbladder ejection fraction was determined. At 60 min, normal ejection fraction is greater than 33%. RADIOPHARMACEUTICALS:  5.3 mCi Tc-65m  Choletec IV COMPARISON:  CT scan of July 01, 2018. FINDINGS: Prompt uptake and biliary excretion of activity by the liver is seen. Gallbladder activity is visualized, consistent with patency of cystic duct. Biliary activity passes into small bowel, consistent with patent common bile duct. Calculated gallbladder ejection fraction is 24%, which is below normal. Patient did report discomfort in her back after ingesting Ensure. (Normal gallbladder ejection fraction with Ensure is greater than 33%.) IMPRESSION: Gallbladder ejection fraction of 24% was noted after Ensure administration, which is below normal and concerning for biliary dyskinesis. Electronically Signed   By: Marijo Conception, M.D.   On: 07/04/2018 13:54   Anti-infectives (From admission, onward)   Start     Dose/Rate Route Frequency Ordered Stop   07/03/18 0400  vancomycin (VANCOCIN) 1,500 mg in sodium chloride 0.9 % 500 mL IVPB  Status:  Discontinued     1,500 mg 250 mL/hr over 120 Minutes Intravenous Every 36 hours 07/01/18 1540 07/02/18 0912   07/01/18 1545  ceFEPIme (MAXIPIME) 1 g in sodium chloride 0.9 % 100 mL IVPB  Status:  Discontinued     1 g 200 mL/hr over 30 Minutes Intravenous Every 12 hours 07/01/18 1540 07/02/18 0912   07/01/18 1545  vancomycin (VANCOCIN) 1,750 mg in sodium chloride 0.9 % 500 mL IVPB     1,750 mg 250 mL/hr over 120 Minutes Intravenous  Once 07/01/18 1540 07/02/18 1820       Assessment/Plan PAF on Coumadin HTN HLD CHF  Abdominal Pain - Unclear picture as source of  patients pain. Some symptoms are biliary in nature.  - No evidence of cholecystitis - Patient was offered cholecystectomy versus lifestyle changes/pain control and outpatient follow-up in office - Risks and benefits were discussed with patient as well as her  husband and both daughters - Discussed with patient that there is no guarantee this will improve her pain Surgery vs diet change and outpatient follow up.  - Patient discussed with family, would like to proceed with surgery - Will need cardiac clearance - Goal INR <1.0. Hold Coumadin - Will plan for surgery on Friday, 1/31 pending medical and cardiac clearance - Mobilize and IS  FEN - Regular  VTE - SCD, okay for Heparin per medicine ID - Vanc & Maxipime 1/25 - 1/26  Jillyn Ledger, [facility] 07/05/2018, 9:02 AM Pager: [phone]

## 2018-07-05 NOTE — Discharge Instructions (Addendum)
Information on my medicine - Coumadin®   (Warfarin) ° °This medication education was reviewed with me or my healthcare representative as part of my discharge preparation.   ° °Why was Coumadin prescribed for you? °Coumadin was prescribed for you because you have a blood clot or a medical condition that can cause an increased risk of forming blood clots. Blood clots can cause serious health problems by blocking the flow of blood to the heart, lung, or brain. Coumadin can prevent harmful blood clots from forming. °As a reminder your indication for Coumadin is:   Stroke Prevention Because Of Atrial Fibrillation ° °What test will check on my response to Coumadin? °While on Coumadin (warfarin) you will need to have an INR test regularly to ensure that your dose is keeping you in the desired range. The INR (international normalized ratio) number is calculated from the result of the laboratory test called prothrombin time (PT). ° °If an INR APPOINTMENT HAS NOT ALREADY BEEN MADE FOR YOU please schedule an appointment to have this lab work done by your health care provider within 7 days. °Your INR goal is usually a number between:  2 to 3 or your provider may give you a more narrow range like 2-2.5.  Ask your health care provider during an office visit what your goal INR is. ° °What  do you need to  know  About  COUMADIN? °Take Coumadin (warfarin) exactly as prescribed by your healthcare provider about the same time each day.  DO NOT stop taking without talking to the doctor who prescribed the medication.  Stopping without other blood clot prevention medication to take the place of Coumadin may increase your risk of developing a new clot or stroke.  Get refills before you run out. ° °What do you do if you miss a dose? °If you miss a dose, take it as soon as you remember on the same day then continue your regularly scheduled regimen the next day.  Do not take two doses of Coumadin at the same time. ° °Important Safety  Information °A possible side effect of Coumadin (Warfarin) is an increased risk of bleeding. You should call your healthcare provider right away if you experience any of the following: °? Bleeding from an injury or your nose that does not stop. °? Unusual colored urine (red or dark brown) or unusual colored stools (red or black). °? Unusual bruising for unknown reasons. °? A serious fall or if you hit your head (even if there is no bleeding). ° °Some foods or medicines interact with Coumadin® (warfarin) and might alter your response to warfarin. To help avoid this: °? Eat a balanced diet, maintaining a consistent amount of Vitamin K. °? Notify your provider about major diet changes you plan to make. °? Avoid alcohol or limit your intake to 1 drink for women and 2 drinks for men per day. °(1 drink is 5 oz. wine, 12 oz. beer, or 1.5 oz. liquor.) ° °Make sure that ANY health care provider who prescribes medication for you knows that you are taking Coumadin (warfarin).  Also make sure the healthcare provider who is monitoring your Coumadin knows when you have started a new medication including herbals and non-prescription products. ° °Coumadin® (Warfarin)  Major Drug Interactions  °Increased Warfarin Effect Decreased Warfarin Effect  °Alcohol (large quantities) °Antibiotics (esp. Septra/Bactrim, Flagyl, Cipro) °Amiodarone (Cordarone) °Aspirin (ASA) °Cimetidine (Tagamet) °Megestrol (Megace) °NSAIDs (ibuprofen, naproxen, etc.) °Piroxicam (Feldene) °Propafenone (Rythmol SR) °Propranolol (Inderal) °Isoniazid (INH) °Posaconazole (Noxafil) Barbiturates (Phenobarbital) °  Carbamazepine (Tegretol) Chlordiazepoxide (Librium) Cholestyramine (Questran) Griseofulvin Oral Contraceptives Rifampin Sucralfate (Carafate) Vitamin K   Coumadin (Warfarin) Major Herbal Interactions  Increased Warfarin Effect Decreased Warfarin Effect  Garlic Ginseng Ginkgo biloba Coenzyme Q10 Green tea St. Johns wort    Coumadin (Warfarin)  FOOD Interactions  Eat a consistent number of servings per week of foods HIGH in Vitamin K (1 serving =  cup)  Collards (cooked, or boiled & drained) Kale (cooked, or boiled & drained) Mustard greens (cooked, or boiled & drained) Parsley *serving size only =  cup Spinach (cooked, or boiled & drained) Swiss chard (cooked, or boiled & drained) Turnip greens (cooked, or boiled & drained)  Eat a consistent number of servings per week of foods MEDIUM-HIGH in Vitamin K (1 serving = 1 cup)  Asparagus (cooked, or boiled & drained) Broccoli (cooked, boiled & drained, or raw & chopped) Brussel sprouts (cooked, or boiled & drained) *serving size only =  cup Lettuce, raw (green leaf, endive, romaine) Spinach, raw Turnip greens, raw & chopped   These websites have more information on Coumadin (warfarin):  FailFactory.se; VeganReport.com.au;   Please arrive at least 30 min before your appointment to complete your check in paperwork.  If you are unable to arrive 30 min prior to your appointment time we may have to cancel or reschedule you.  LAPAROSCOPIC SURGERY: POST OP INSTRUCTIONS  1. DIET: Follow a light bland diet the first 24 hours after arrival home, such as soup, liquids, crackers, etc. Be sure to include lots of fluids daily. Avoid fast food or heavy meals as your are more likely to get nauseated. Eat a low fat the next few days after surgery.  2. Take your usually prescribed home medications unless otherwise directed. 3. PAIN CONTROL:  1. Pain is best controlled by a usual combination of three different methods TOGETHER:  1. Ice/Heat 2. Over the counter pain medication 3. Prescription pain medication 2. Most patients will experience some swelling and bruising around the incisions. Ice packs or heating pads (30-60 minutes up to 6 times a day) will help. Use ice for the first few days to help decrease swelling and bruising, then switch to heat to help relax tight/sore  spots and speed recovery. Some people prefer to use ice alone, heat alone, alternating between ice & heat. Experiment to what works for you. Swelling and bruising can take several weeks to resolve.  3. It is helpful to take an over-the-counter pain medication regularly for the first few weeks. Choose one of the following that works best for you:  1. Naproxen (Aleve, etc) Two 220mg  tabs twice a day 2. Ibuprofen (Advil, etc) Three 200mg  tabs four times a day (every meal & bedtime) 3. Acetaminophen (Tylenol, etc) 500-650mg  four times a day (every meal & bedtime) 4. A prescription for pain medication (such as oxycodone, hydrocodone, etc) should be given to you upon discharge. Take your pain medication as prescribed.  1. If you are having problems/concerns with the prescription medicine (does not control pain, nausea, vomiting, rash, itching, etc), please call us [phone] to see if we need to switch you to a different pain medicine that will work better for you and/or control your side effect better. 2. If you need a refill on your pain medication, please contact your pharmacy. They will contact our office to request authorization. Prescriptions will not be filled after 5 pm or on week-ends. 4. Avoid getting constipated. Between the surgery and the pain medications, it is common to  experience some constipation. Increasing fluid intake and taking a fiber supplement (such as Metamucil, Citrucel, FiberCon, MiraLax, etc) 1-2 times a day regularly will usually help prevent this problem from occurring. A mild laxative (prune juice, Milk of Magnesia, MiraLax, etc) should be taken according to package directions if there are no bowel movements after 48 hours.  5. Watch out for diarrhea. If you have many loose bowel movements, simplify your diet to bland foods & liquids for a few days. Stop any stool softeners and decrease your fiber supplement. Switching to mild anti-diarrheal medications (Kayopectate, Pepto  Bismol) can help. If this worsens or does not improve, please call us. 6. Wash / shower every day. You may shower over the dressings as they are waterproof. Continue to shower over incision(s) after the dressing is off. 7. Remove your waterproof bandages 5 days after surgery. You may leave the incision open to air. You may replace a dressing/Band-Aid to cover the incision for comfort if you wish.  8. ACTIVITIES as tolerated:  1. You may resume regular (light) daily activities beginning the next day--such as daily self-care, walking, climbing stairs--gradually increasing activities as tolerated. If you can walk 30 minutes without difficulty, it is safe to try more intense activity such as jogging, treadmill, bicycling, low-impact aerobics, swimming, etc. 2. Save the most intensive and strenuous activity for last such as sit-ups, heavy lifting, contact sports, etc Refrain from any heavy lifting or straining until you are off narcotics for pain control.  3. DO NOT PUSH THROUGH PAIN. Let pain be your guide: If it hurts to do something, don't do it. Pain is your body warning you to avoid that activity for another week until the pain goes down. 4. You may drive when you are no longer taking prescription pain medication, you can comfortably wear a seatbelt, and you can safely maneuver your car and apply brakes. 5. You may have sexual intercourse when it is comfortable.  9. FOLLOW UP in our office  1. Please call [facility] at [phone] to set up an appointment to see your surgeon in the office for a follow-up appointment approximately 2-3 weeks after your surgery. 2. Make sure that you call for this appointment the day you arrive home to insure a convenient appointment time.      10. IF YOU HAVE DISABILITY OR FAMILY LEAVE FORMS, BRING THEM TO THE               OFFICE FOR PROCESSING.   WHEN TO CALL us [phone]:  1. Poor pain control 2. Reactions / problems with new medications (rash/itching, nausea, etc)   3. Fever over 101.5 F (38.5 C) 4. Inability to urinate 5. Nausea and/or vomiting 6. Worsening swelling or bruising 7. Continued bleeding from incision. 8. Increased pain, redness, or drainage from the incision  The clinic staff is available to answer your questions during regular business hours (8:30am-5pm). Please dont hesitate to call and ask to speak to one of our nurses for clinical concerns.  If you have a medical emergency, go to the nearest emergency room or call 911.  A surgeon from [facility] is always on call at the [facility], [place], [place], [place], [place] [address] ?  MAIN: [phone] ? TOLL FREE: [phone] ?  FAX [phone]  [email or web address]

## 2018-07-05 NOTE — Progress Notes (Signed)
PROGRESS NOTE    [name]  OBS:[id number] DOB: [birth year] DOA: 07/01/2018 PCP: [name], MD   Brief Narrative:  [name] is a 78 y.o.femalewith medical history significant ofParkinson's disease, congestive heart failure with preserved ejection fraction, chronic back pain, degenerative joint disease, gastroesophageal reflux disease, hyperlipidemia, hypertension and paroxysmal atrial fibrillation on warfarin who presents for evaluation of worsening onset of progressively worsening low back pain and abdominal pain which has been present for at least 3 months.   Thoracolumbar MRI> several indicators of discitis-osteomyelitis at the T12-L1 level. The patient was started on Vanc and Cefepime and admitted. Further workup deemed that she likely did not have a discitis/Osteomyelitis and Abx were D/C'd. Patient was going to be D/C'd but continued to complain of Abdominal Pain so GI was consulted and a HIDA Scan was done which showed a low EF. General Surgery was consulted and plan on taking the patient for a cholecystectomy Friday. Cardiology was consulted for preoperative Clearance.   Assessment & Plan:   Principal Problem:   Discitis of thoracolumbar region Active Problems:   Osteoarthritis   Obstructive sleep apnea   Atrial fibrillation with RVR (HCC)   Chronic anticoagulation -warfarin therapy   Parkinsonism (HCC)  Chronic Back Pain Discitis of thoracolumbar region? - s/p multiple back surgeries by neuro surgery- pain appears to be chronic to me ? Discitison MRI- patient is not tender in the area of supposed discitis- Dr. Wynelle Cleveland discontinued the antibiotics andasked for a Neurosurgeryand ID opinion - the conclusion is that it is not likely she has a discitis and at this time does not need further work up  -in regards to her chronic back-she states she was prescribed PT which exacerbated this(thus no PT ordered again) - she states the pain moves around and  there is no radiation down her legs and no other neurological symptoms - there is moderate thoracic stenosis on the MRIwhich Dr. Wynelle Cleveland discussed with NS-NS does not feel it needs surgery or further inpatient management  It appears to be muscular pain on my exam and she was started on Voltaren Gel -C/w Oxycodone IR 10 mg q4hprn Severe Pain and 5 mg po q4hprn  Abdominal Bloating and pain when eating -The patient and her daughter have asked me for a GI consult- this is a chronic issue that she saw Dr Cristina Gong for last month but apparently has not yet resolved-  -GI consulted- her pain does not seem consistent with gallbladder disease but a HIDA scan was ordered- it shows a low EF -General surgery consulted and plan is for Cholecystectomy on Friday 07/07/2018 -C/w Pantoprazole 40 mg po BID  -Cardiology Consulted for Pre-operative Clearance    Obstructive Sleep Apnea -Does not use CPAP  Paroxysmal A Fib  -Cont Flecainide -Coumadin per pharmacy stopped and will change to IV Heparin for Surgery on Friday -Cardiology consulted for Pre-operative Clearance  -Recommending Resuming Coumadin post-operatively when safe by Surgery  Thrombocytopenia  -Appears to be chronic  Parkinsonism -Continue Sinemet IR 25-100 2 tab po 4 times daily   Obesity -Estimated body mass index is 30.82 kg/m as calculated from the following:   Height as of this encounter: 5\' 5"  (1.651 m).   Weight as of this encounter: 84 kg. -Weight Loss Counseling given   HLD -C/w Statin   HTN -C/w Metoprolol XL -Continue to Monitor Blood Pressures carefully  Obesity -Estimated body mass index is 30.82 kg/m as calculated from the following:   Height as of this encounter:  5\' 5"  (1.651 m).   Weight as of this encounter: 84 kg. -Weight Loss Counseling given   DVT prophylaxis: Anticoagulated with Heparin gtt Code Status: FULL CODE  Family Communication: No family present Disposition Plan:   Consultants:    Cardiology  Gastroenterology  General Surgery  Neurosurgery  Infectious Diseases   Procedures: HIDA Scan   Antimicrobials:  Anti-infectives (From admission, onward)   Start     Dose/Rate Route Frequency Ordered Stop   07/03/18 0400  vancomycin (VANCOCIN) 1,500 mg in sodium chloride 0.9 % 500 mL IVPB  Status:  Discontinued     1,500 mg 250 mL/hr over 120 Minutes Intravenous Every 36 hours 07/01/18 1540 07/02/18 0912   07/01/18 1545  ceFEPIme (MAXIPIME) 1 g in sodium chloride 0.9 % 100 mL IVPB  Status:  Discontinued     1 g 200 mL/hr over 30 Minutes Intravenous Every 12 hours 07/01/18 1540 07/02/18 0912   07/01/18 1545  vancomycin (VANCOCIN) 1,750 mg in sodium chloride 0.9 % 500 mL IVPB     1,750 mg 250 mL/hr over 120 Minutes Intravenous  Once 07/01/18 1540 07/02/18 1820     Subjective: Seen and examined and still complaining of diffuse abdominal pain and back pain. No CP or SOB. No Nausea or vomiting. Agreeable to cholecystectomy.   Objective: Vitals:   07/04/18 1318 07/04/18 2050 07/05/18 0406 07/05/18 0853  BP: (!) 130/54 (!) 142/69 (!) 148/61 (!) 152/65  Pulse: 60 71 (!) 57 62  Resp:  18 12   Temp: (!) 97.5 F (36.4 C) 98.4 F (36.9 C) 97.9 F (36.6 C)   TempSrc: Oral Oral Oral   SpO2: 91% 100% 98%   Weight:      Height:        Intake/Output Summary (Last 24 hours) at 07/05/2018 1435 Last data filed at 07/05/2018 0600 Gross per 24 hour  Intake 480 ml  Output 200 ml  Net 280 ml   Filed Weights   07/01/18 1800 07/01/18 2300  Weight: 84.8 kg 84 kg   Examination: Physical Exam:  Constitutional: WN/WD obese Caucasian female in NAD and appears calm but uncomfortable Eyes: Lids and conjunctivae normal, sclerae anicteric  ENMT: External Ears, Nose appear normal. Grossly normal hearing. Mucous membranes are moist. Neck: Appears normal, supple, no cervical masses, normal ROM, no appreciable thyromegaly; no JVD Respiratory: Diminished to auscultation  bilaterally, no wheezing, rales, rhonchi or crackles. Normal respiratory effort and patient is not tachypenic. No accessory muscle use.  Cardiovascular: RRR, no murmurs / rubs / gallops. S1 and S2 auscultated. Trace extremity edema.  Abdomen: Soft, non-tender, Distended 2/2 body habitus. No masses palpated. No appreciable hepatosplenomegaly. Bowel sounds positive x4.  GU: Deferred. Musculoskeletal: No clubbing / cyanosis of digits/nails. No joint deformity upper and lower extremities.  Skin: No rashes, lesions, ulcers on a limited skin evaluation. No induration; Warm and dry.  Neurologic: CN 2-12 grossly intact with no focal deficits. Romberg sign and cerebellar reflexes not assessed.  Psychiatric: Normal judgment and insight. Alert and oriented x 3. Normal mood and appropriate affect.   Data Reviewed: I have personally reviewed following labs and imaging studies  CBC: Recent Labs  Lab 07/01/18 0057 07/02/18 0438 07/05/18 0955  WBC 5.6 4.4 5.0  NEUTROABS  --   --  3.7  HGB 12.0 11.7* 13.6  HCT 40.3 37.0 42.7  MCV 87.8 85.5 85.7  PLT 145* 130* 185   Basic Metabolic Panel: Recent Labs  Lab 07/01/18 0057 07/02/18 0438 07/05/18 6314  NA 138 139 140  K 4.2 3.9 4.2  CL 104 106 107  CO2 24 23 26   GLUCOSE 120* 97 97  BUN 22 16 23   CREATININE 1.27* 0.93 0.92  CALCIUM 9.2 8.7* 9.6  MG  --   --  2.0  PHOS  --   --  3.2   GFR: Estimated Creatinine Clearance: 56.6 mL/min (by C-G formula based on SCr of 0.92 mg/dL). Liver Function Tests: Recent Labs  Lab 07/01/18 0057 07/05/18 0955  AST 16 18  ALT 5 <5  ALKPHOS 99 93  BILITOT 0.4 0.7  PROT 6.8 7.2  ALBUMIN 4.0 3.9   Recent Labs  Lab 07/01/18 0057  LIPASE 30   No results for input(s): AMMONIA in the last 168 hours. Coagulation Profile: Recent Labs  Lab 07/01/18 0830 07/02/18 0438 07/03/18 0645 07/05/18 0351  INR 2.34 2.06 1.60 1.27   Cardiac Enzymes: No results for input(s): CKTOTAL, CKMB, CKMBINDEX, TROPONINI  in the last 168 hours. BNP (last 3 results) No results for input(s): PROBNP in the last 8760 hours. HbA1C: No results for input(s): HGBA1C in the last 72 hours. CBG: No results for input(s): GLUCAP in the last 168 hours. Lipid Profile: No results for input(s): CHOL, HDL, LDLCALC, TRIG, CHOLHDL, LDLDIRECT in the last 72 hours. Thyroid Function Tests: No results for input(s): TSH, T4TOTAL, FREET4, T3FREE, THYROIDAB in the last 72 hours. Anemia Panel: No results for input(s): VITAMINB12, FOLATE, FERRITIN, TIBC, IRON, RETICCTPCT in the last 72 hours. Sepsis Labs: No results for input(s): PROCALCITON, LATICACIDVEN in the last 168 hours.  Recent Results (from the past 240 hour(s))  Blood culture (routine x 2)     Status: None (Preliminary result)   Collection Time: 07/01/18  8:15 AM  Result Value Ref Range Status   Specimen Description BLOOD LEFT WRIST  Final   Special Requests   Final    BOTTLES DRAWN AEROBIC AND ANAEROBIC Blood Culture results may not be optimal due to an inadequate volume of blood received in culture bottles   Culture   Final    NO GROWTH 4 DAYS Performed at [facility], [place] [address] [place], [place] [address]    Report Status PENDING  Incomplete  Blood culture (routine x 2)     Status: None (Preliminary result)   Collection Time: 07/01/18  8:30 AM  Result Value Ref Range Status   Specimen Description BLOOD RIGHT ANTECUBITAL  Final   Special Requests   Final    BOTTLES DRAWN AEROBIC AND ANAEROBIC Blood Culture adequate volume   Culture   Final    NO GROWTH 4 DAYS Performed at [facility], [place] [address], [place],  [address]    Report Status PENDING  Incomplete    Radiology Studies: Nm Hepato W/eject Fract  Result Date: 07/04/2018 CLINICAL DATA:  Right upper quadrant abdominal pain. EXAM: NUCLEAR MEDICINE HEPATOBILIARY IMAGING WITH GALLBLADDER EF TECHNIQUE: Sequential images of the abdomen were obtained out to 60 minutes following  intravenous administration of radiopharmaceutical. After oral ingestion of Ensure, gallbladder ejection fraction was determined. At 60 min, normal ejection fraction is greater than 33%. RADIOPHARMACEUTICALS:  5.3 mCi Tc-80m  Choletec IV COMPARISON:  CT scan of July 01, 2018. FINDINGS: Prompt uptake and biliary excretion of activity by the liver is seen. Gallbladder activity is visualized, consistent with patency of cystic duct. Biliary activity passes into small bowel, consistent with patent common bile duct. Calculated gallbladder ejection fraction is 24%, which is below normal.  Patient did report discomfort in her back after ingesting Ensure. (Normal gallbladder ejection fraction with Ensure is greater than 33%.) IMPRESSION: Gallbladder ejection fraction of 24% was noted after Ensure administration, which is below normal and concerning for biliary dyskinesis. Electronically Signed   By: Marijo Conception, M.D.   On: 07/04/2018 13:54   Scheduled Meds: . acetaminophen  650 mg Oral Q6H  . acidophilus  1 capsule Oral Daily  . carbidopa-levodopa  2 tablet Oral QID  . chlorzoxazone  500 mg Oral BID  . dicyclomine  20 mg Oral TID AC  . flecainide  50 mg Oral BID  . gabapentin  300 mg Oral QHS  . latanoprost  1 drop Both Eyes QHS  . lidocaine  1 patch Transdermal Q24H  . metoprolol succinate  100 mg Oral Daily  . pantoprazole  40 mg Oral BID  . polyethylene glycol  17 g Oral BID  . potassium chloride SA  40 mEq Oral Daily  . pramipexole  0.5 mg Oral QHS  . pravastatin  40 mg Oral QHS  . senna  1 tablet Oral BID  . warfarin  4 mg Oral ONCE-1800  . Warfarin - Pharmacist Dosing Inpatient   Does not apply q1800   Continuous Infusions:   LOS: 4 days    Kerney Elbe, DO [facility] PAGER is on AMION  If 7PM-7AM, please contact night-coverage [email or web address] Password [email or web address] 07/05/2018, 2:35 PM

## 2018-07-05 NOTE — Progress Notes (Signed)
ANTICOAGULATION CONSULT NOTE - Initial Consult  Pharmacy Consult for heparin Indication: atrial fibrillation  Allergies  Allergen Reactions  . Lyrica [Pregabalin] Other (See Comments)    Felt loopy  . Zocor [Simvastatin] Other (See Comments)    Myalgias  . Relafen [Nabumetone] Rash    Patient Measurements: Height: 5\' 5"  (165.1 cm) Weight: 185 lb 3 oz (84 kg) IBW/kg (Calculated) : 57 Heparin Dosing Weight: 84 kg  Vital Signs: BP: 152/65 (01/29 0853) Pulse Rate: 62 (01/29 0853)  Labs: Recent Labs    07/03/18 0645 07/05/18 0351 07/05/18 0955  HGB  --   --  13.6  HCT  --   --  42.7  PLT  --   --  161  LABPROT 18.9* 15.8*  --   INR 1.60 1.27  --   CREATININE  --   --  0.92    Estimated Creatinine Clearance: 56.6 mL/min (by C-G formula based on SCr of 0.92 mg/dL).   Medical History: Past Medical History:  Diagnosis Date  . Allergic rhinitis   . Arthritis    "all over"  . CHF (congestive heart failure) ([place])   . Chronic back pain   . DJD (degenerative joint disease)   . GERD (gastroesophageal reflux disease)   . Hyperlipidemia   . Hypertension   . Lumbar stenosis   . OSA (obstructive sleep apnea)    "couldn't handle CPAP; use mouth guard some; not all the time" (01/06/2014)  . PAF (paroxysmal atrial fibrillation) ([place])   . Scoliosis    with radiculopathy L2-S1 with prior surgery  . Small bowel obstruction (HCC)    versus ileus after last bck surgery  . Spondylosis    Assessment: 78 yo female with atrial fibrillation who is to have a  laparoscopic cholecystectomy on Friday. MD has requested pharmacy dose heparin and hold warfarin in anticipation of surgery on Friday. INR 1.27 today.    Goal of Therapy:  Heparin level 0.3-0.7 units/ml Monitor platelets by anticoagulation protocol: Yes   Plan:  Heparin 4000 units bolus x 1 Heparin drip at 1200 units/hr Hold warfarin Heparin level 8 hours after start Daily Heparin level Monitor for bleeding  Kellie Murrill A.  Levada Dy, PharmD, [place] Pager: [phone] Please utilize Amion for appropriate phone number to reach the unit pharmacist ([place])   07/05/2018,5:01 PM

## 2018-07-05 NOTE — Progress Notes (Signed)
[facility] PROGRESS NOTE Subjective Patient in room with husband and daughter continues to complain of pain.  Has 3 distinct pains.  Back pain, left lower quadrant pain, and right upper quadrant pain.  Gallbladder ejection fraction low.  Objective: Vital signs in last 24 hours: Temp:  [97.5 F (36.4 C)-98.4 F (36.9 C)] 97.9 F (36.6 C) (01/29 0406) Pulse Rate:  [57-71] 62 (01/29 0853) Resp:  [12-18] 12 (01/29 0406) BP: (130-152)/(54-69) 152/65 (01/29 0853) SpO2:  [91 %-100 %] 98 % (01/29 0406) Last BM Date: 06/30/18  Intake/Output from previous day: 01/28 0701 - 01/29 0700 In: 480 [P.O.:480] Out: 550 [Urine:550] Intake/Output this shift: No intake/output data recorded.  PE: General--patient sitting in chair says she is in pain but is in no distress  Abdomen--slightly tender left lower quadrant as well as right upper quadrant.  Lab Results: Recent Labs    07/05/18 0955  WBC 5.0  HGB 13.6  HCT 42.7  PLT 161   BMET Recent Labs    07/05/18 0955  NA 140  K 4.2  CL 107  CO2 26  CREATININE 0.92   LFT Recent Labs    07/05/18 0955  PROT 7.2  AST 18  ALT <5  ALKPHOS 93  BILITOT 0.7   PT/INR Recent Labs    07/03/18 0645 07/05/18 0351  LABPROT 18.9* 15.8*  INR 1.60 1.27   PANCREAS No results for input(s): LIPASE in the last 72 hours.       Studies/Results: Nm Hepato W/eject Fract  Result Date: 07/04/2018 CLINICAL DATA:  Right upper quadrant abdominal pain. EXAM: NUCLEAR MEDICINE HEPATOBILIARY IMAGING WITH GALLBLADDER EF TECHNIQUE: Sequential images of the abdomen were obtained out to 60 minutes following intravenous administration of radiopharmaceutical. After oral ingestion of Ensure, gallbladder ejection fraction was determined. At 60 min, normal ejection fraction is greater than 33%. RADIOPHARMACEUTICALS:  5.3 mCi Tc-50m  Choletec IV COMPARISON:  CT scan of July 01, 2018. FINDINGS: Prompt uptake and biliary excretion of activity by  the liver is seen. Gallbladder activity is visualized, consistent with patency of cystic duct. Biliary activity passes into small bowel, consistent with patent common bile duct. Calculated gallbladder ejection fraction is 24%, which is below normal. Patient did report discomfort in her back after ingesting Ensure. (Normal gallbladder ejection fraction with Ensure is greater than 33%.) IMPRESSION: Gallbladder ejection fraction of 24% was noted after Ensure administration, which is below normal and concerning for biliary dyskinesis. Electronically Signed   By: Marijo Conception, M.D.   On: 07/04/2018 13:54    Medications: I have reviewed the patient's current medications.  Assessment:   1.  Abdominal pain.  This is really quite confusing and that she has several different pains.  She does have a diminished ejection fraction but I do not feel this is causing all of her pain.  This may all be coming from the back or she may be experiencing different pains from different etiologies.  It could well be that some of her pain is coming from an abnormal gallbladder   Plan: Being evaluated by surgery.  I have clearly explained to the patient and family that she could have some improvement with cholecystectomy but many of her other pains could still be present following surgery.  She has had CT, ultrasound, MRI and MRCP failing to show other causes of her pain.   Nancy Fetter 07/05/2018, 11:40 AM  This note was created using voice recognition software. Minor errors may Have occurred unintentionally.  Pager:  [phone] If no answer or after hours call [phone]

## 2018-07-05 NOTE — Progress Notes (Signed)
ANTICOAGULATION CONSULT NOTE  Pharmacy Consult:  Coumadin Indication: atrial fibrillation  Allergies  Allergen Reactions  . Lyrica [Pregabalin] Other (See Comments)    Felt loopy  . Zocor [Simvastatin] Other (See Comments)    Myalgias  . Relafen [Nabumetone] Rash    Patient Measurements: Height: 5\' 5"  (165.1 cm) Weight: 185 lb 3 oz (84 kg) IBW/kg (Calculated) : 57  Vital Signs: Temp: 97.9 F (36.6 C) (01/29 0406) Temp Source: Oral (01/29 0406) BP: 152/65 (01/29 0853) Pulse Rate: 62 (01/29 0853)  Labs: Recent Labs    07/03/18 0645 07/05/18 0351 07/05/18 0955  HGB  --   --  13.6  HCT  --   --  42.7  PLT  --   --  161  LABPROT 18.9* 15.8*  --   INR 1.60 1.27  --     Estimated Creatinine Clearance: 55.9 mL/min (by C-G formula based on SCr of 0.93 mg/dL).   Assessment: 59 YOF with history of Afib on Coumadin PTA.  Patient presented with worsening low back pain and abdominal pain.  Pharmacy consulted to continue Coumadin while here.  INR trended down further; 07/04/18 Coumadin dose not charted as given.  No bleeding reported.  Home Coumadin dose per patient: No dose on Tue/Thu; 2.5 mg on all other day   Home Coumadin dose per Coumadin Clinic: 1.25mg  on Tue/Thu; 2.5 mg on all other day    Goal of Therapy:  INR 2-3 Monitor platelets by anticoagulation protocol: Yes   Plan:  Coumadin 4mg  PO today Daily PT / INR Please hold Coumadin if plan to have surgery   Allanna Bresee D. Mina Marble, PharmD, BCPS, [place] 07/05/2018, 10:59 AM

## 2018-07-05 NOTE — Consult Note (Signed)
Cardiology Consultation:   Patient ID: [name] MRN: [id number]; DOB: 1940/09/16  Admit date: 07/01/2018 Date of Consult: 07/05/2018  Primary Care Provider: [name], MD Primary Cardiologist: [name] Rouge, MD  Primary Electrophysiologist:  None    Patient Profile:   [name] is a 78 y.o. female with a hx of PAF on flecainide and warfarin, OSA intolerant to CPAP, hypertension, hyperlipidemia, GERD who is being seen today for preoperative evaluation at the request of [name].  History of Present Illness:   [name] has been having ongoing abdominal discomfort for several months with no clear etiology.  Currently she was admitted with daily abdominal pain.  No evidence of cholecystitis found but abdominal CT showed gallbladder sludge and HIDA scan showed concerns for biliary dyskinesis with gallbladder ejection fraction of 24%.  The surgical team has discussed options with the patient and they are considering cholecystectomy planned for Friday 1/31.  [name] is followed in our office by [name] for hypertension and paroxysmal atrial fibrillation.  Her atrial fibrillation is controlled by flecainide and she is on warfarin for stroke risk reduction.  She has a history of heart catheterization in February/2002 that demonstrated normal coronary arteries and normal LV function.  Echocardiogram in 2015 revealed normal LV function with EF 60-65% and normal wall motion.  She had a Myoview in 02/2014 that showed normal EF and no ischemia.  Upon my examination the patient is having abdominal pain and sometimes in her back but no chest pain, dyspnea, orthopnea, lightheadedness or edema. She uses lasix sometimes for lower leg edema but not much.   She lives with her husband and is not very active. She does light housework and prepares meals. She goes shopping with her husband but has to lean on the shopping cart. She says that she is limited by back and abdominal  pain, not shortness of breath.   Past Medical History:  Diagnosis Date  . Allergic rhinitis   . Arthritis    "all over"  . CHF (congestive heart failure) ([place])   . Chronic back pain   . DJD (degenerative joint disease)   . GERD (gastroesophageal reflux disease)   . Hyperlipidemia   . Hypertension   . Lumbar stenosis   . OSA (obstructive sleep apnea)    "couldn't handle CPAP; use mouth guard some; not all the time" (01/06/2014)  . PAF (paroxysmal atrial fibrillation) ([place])   . Scoliosis    with radiculopathy L2-S1 with prior surgery  . Small bowel obstruction ([facility])    versus ileus after last bck surgery  . Spondylosis     Past Surgical History:  Procedure Laterality Date  . BACK SURGERY    . CATARACT EXTRACTION W/ INTRAOCULAR LENS  IMPLANT, BILATERAL  2011  . COLONOSCOPY  12/09/2004  . LIPOMA EXCISION  1980's   "fatty tumors"  . [place] SURGERY  02/2009   "ruptured disc"  . NASAL SEPTUM SURGERY  80's  . POSTERIOR LUMBAR FUSION  06/2010; 10/2011   "placed screws, rods, spacers both times"  . REPAIR DURAL / CSF LEAK  02/2009  . TOTAL ABDOMINAL HYSTERECTOMY  02/1993     Home Medications:  Prior to Admission medications   Medication Sig Start Date End Date Taking? Authorizing Provider  acetaminophen (TYLENOL) 325 MG tablet Take 325 mg by mouth daily as needed for mild pain (pain).    Yes [provider]  calcium carbonate (TUMS - DOSED IN MG ELEMENTAL CALCIUM) 500 MG chewable  tablet Chew 1 tablet by mouth as needed for indigestion or heartburn.    Yes [provider]  carbidopa-levodopa (SINEMET IR) 25-100 MG tablet Take 2 tablets by mouth 4 (four) times daily. 02/02/18  Yes Tat, Eustace Quail, DO  chlorzoxazone (PARAFON) 500 MG tablet Take 500 mg by mouth 2 (two) times daily.  06/26/18  Yes [provider]  fexofenadine (ALLEGRA) 180 MG tablet Take 180 mg by mouth as needed. For allergies   Yes [provider]  flecainide (TAMBOCOR) 50 MG tablet  TAKE 1 TABLET BY MOUTH 2 TIMES DAILY Patient taking differently: Take 50 mg by mouth 2 (two) times daily.  02/27/18  Yes Josue Hector, MD  furosemide (LASIX) 20 MG tablet Take 40 mg by mouth as needed.    Yes [provider]  gabapentin (NEURONTIN) 300 MG capsule Take 300 mg by mouth at bedtime.   Yes [provider]  metoprolol succinate (TOPROL-XL) 100 MG 24 hr tablet TAKE 1 TABLET BY MOUTH DAILY Patient taking differently: Take 100 mg by mouth daily.  01/30/18  Yes Josue Hector, MD  oxyCODONE-acetaminophen (PERCOCET/ROXICET) 5-325 MG tablet Take 1-2 tablets by mouth every 6 (six) hours as needed. 06/29/18  Yes [provider]  pantoprazole (PROTONIX) 40 MG tablet Take 40 mg by mouth 2 (two) times daily.   Yes [provider]  potassium chloride SA (K-DUR,KLOR-CON) 20 MEQ tablet Take 40 mEq by mouth daily.    Yes [provider]  pramipexole (MIRAPEX) 0.5 MG tablet Take 0.5-1 mg by mouth at bedtime. For restless legs   Yes [provider]  pravastatin (PRAVACHOL) 40 MG tablet Take 40 mg by mouth at bedtime.    Yes [provider]  Probiotic Product (PROBIOTIC-10 PO) Take by mouth daily.   Yes [provider]  TRAVATAN Z 0.004 % SOLN ophthalmic solution Place 1 drop into both eyes at bedtime.  01/21/15  Yes [provider]  warfarin (COUMADIN) 2.5 MG tablet Take 1 tablet (2.5 mg total) by mouth as directed. Patient taking differently: Take 2.5 mg by mouth See admin instructions. Hold on Tuesday and Thursday Take 2.5 mg all the other days 06/14/18 06/14/19 Yes Josue Hector, MD  lidocaine (LIDODERM) 5 % Place 1-2 patches onto the skin every 12 (twelve) hours for 30 days. Remove & Discard patch within 12 hours or as directed by MD 07/03/18 08/02/18  Debbe Odea, MD    Inpatient Medications: Scheduled Meds: . acetaminophen  650 mg Oral Q6H  . acidophilus  1 capsule Oral Daily  . carbidopa-levodopa  2 tablet Oral QID   . chlorzoxazone  500 mg Oral BID  . dicyclomine  20 mg Oral TID AC  . flecainide  50 mg Oral BID  . gabapentin  300 mg Oral QHS  . latanoprost  1 drop Both Eyes QHS  . lidocaine  1 patch Transdermal Q24H  . metoprolol succinate  100 mg Oral Daily  . pantoprazole  40 mg Oral BID  . polyethylene glycol  17 g Oral BID  . potassium chloride SA  40 mEq Oral Daily  . pramipexole  0.5 mg Oral QHS  . pravastatin  40 mg Oral QHS  . senna  1 tablet Oral BID  . warfarin  4 mg Oral ONCE-1800  . Warfarin - Pharmacist Dosing Inpatient   Does not apply q1800   Continuous Infusions:  PRN Meds: calcium carbonate, morphine injection, ondansetron **OR** ondansetron (ZOFRAN) IV, oxyCODONE, oxyCODONE, traZODone  Allergies:    Allergies  Allergen Reactions  . Lyrica [Pregabalin] Other (See Comments)    Felt loopy  . Zocor [Simvastatin] Other (See Comments)    Myalgias  . Relafen [Nabumetone] Rash    Social History:   Social History   Socioeconomic History  . Marital status: Married    Spouse name: [name]  . Number of children: 2  . Years of education: 12th   . Highest education level: Not on file  Occupational History  . Occupation: retired    Fish farm manager: RETIRED    Comment: clerical office work  Social Needs  . Financial resource strain: Not on file  . Food insecurity:    Worry: Not on file    Inability: Not on file  . Transportation needs:    Medical: Not on file    Non-medical: Not on file  Tobacco Use  . Smoking status: Never Smoker  . Smokeless tobacco: Never Used  Substance and Sexual Activity  . Alcohol use: No    Alcohol/week: 0.0 standard drinks    Comment: 02/01/2014 " glasseof wine once in a blue moon; < once/year"  . Drug use: No  . Sexual activity: Never    Comment: hysterectomy  Lifestyle  . Physical activity:    Days per week: Not on file    Minutes per session: Not on file  . Stress: Not on file  Relationships  . Social connections:    Talks on phone: Not  on file    Gets together: Not on file    Attends religious service: Not on file    Active member of club or organization: Not on file    Attends meetings of clubs or organizations: Not on file    Relationship status: Not on file  . Intimate partner violence:    Fear of current or ex partner: Not on file    Emotionally abused: Not on file    Physically abused: Not on file    Forced sexual activity: Not on file  Other Topics Concern  . Not on file  Social History Narrative   Patient lives at home with husband [name].   Patient is retired.   Patient has a high school education.    Patient has 2 children     Family History:    Family History  Problem Relation Age of Onset  . Arthritis Mother   . Heart attack Father   . Hypertension Sister   . Lung cancer Sister   . Asthma Sister   . Allergies Sister      ROS:  Please see the history of present illness.   All other ROS reviewed and negative.     Physical Exam/Data:   Vitals:   07/04/18 1318 07/04/18 2050 07/05/18 0406 07/05/18 0853  BP: (!) 130/54 (!) 142/69 (!) 148/61 (!) 152/65  Pulse: 60 71 (!) 57 62  Resp:  18 12   Temp: (!) 97.5 F (36.4 C) 98.4 F (36.9 C) 97.9 F (36.6 C)   TempSrc: Oral Oral Oral   SpO2: 91% 100% 98%   Weight:      Height:        Intake/Output Summary (Last 24 hours) at 07/05/2018 1436 Last data filed at 07/05/2018 0600 Gross per 24 hour  Intake 480 ml  Output 200 ml  Net 280 ml   Last 3 Weights 07/01/2018 07/01/2018 02/09/2018  Weight (lbs) 185 lb 3 oz 186 lb 15.2 oz 192 lb  Weight (kg) 84 kg 84.8  kg 87.091 kg     Body mass index is 30.82 kg/m.  General:  Well nourished, well developed, in no acute distress HEENT: normal Lymph: no adenopathy Neck: no JVD Endocrine:  No thryomegaly Vascular: No carotid bruits; pedal pulses 2+ bilaterally  Cardiac:  normal S1, S2; RRR; no murmur  Lungs:  clear to auscultation bilaterally, no wheezing, rhonchi or rales  Abd: soft, nontender, no  hepatomegaly  Ext: trace ankle edema Musculoskeletal:  No deformities, BUE and BLE strength normal and equal Skin: warm and dry  Neuro:  CNs 2-12 intact, no focal abnormalities noted Psych:  Normal affect   EKG: Normal sinus rhythm, 71 bpm, Low voltage QRS, QTC 441 Telemetry:  Not on telemetry  Relevant CV Studies:  Echocardiogram 02/01/2014 Study Conclusions  - Left ventricle: The cavity size was normal. Wall thickness was normal. Systolic function was normal. The estimated ejection fraction was in the range of 60% to 65%. Wall motion was normal; there were no regional wall motion abnormalities. - Pulmonary arteries: PA peak pressure: 34 mm Hg (S).  Laboratory Data:  Chemistry Recent Labs  Lab 07/01/18 0057 07/02/18 0438 07/05/18 0955  NA 138 139 140  K 4.2 3.9 4.2  CL 104 106 107  CO2 24 23 26   GLUCOSE 120* 97 97  BUN 22 16 23   CREATININE 1.27* 0.93 0.92  CALCIUM 9.2 8.7* 9.6  GFRNONAA 41* >60 >60  GFRAA 48* >60 >60  ANIONGAP 10 10 7     Recent Labs  Lab 07/01/18 0057 07/05/18 0955  PROT 6.8 7.2  ALBUMIN 4.0 3.9  AST 16 18  ALT 5 <5  ALKPHOS 99 93  BILITOT 0.4 0.7   Hematology Recent Labs  Lab 07/01/18 0057 07/02/18 0438 07/05/18 0955  WBC 5.6 4.4 5.0  RBC 4.59 4.33 4.98  HGB 12.0 11.7* 13.6  HCT 40.3 37.0 42.7  MCV 87.8 85.5 85.7  MCH 26.1 27.0 27.3  MCHC 29.8* 31.6 31.9  RDW 15.6* 15.5 15.6*  PLT 145* 130* 161   Cardiac EnzymesNo results for input(s): TROPONINI in the last 168 hours. No results for input(s): TROPIPOC in the last 168 hours.  BNPNo results for input(s): BNP, PROBNP in the last 168 hours.  DDimer No results for input(s): DDIMER in the last 168 hours.  Radiology/Studies:  Nm Hepato W/eject Fract  Result Date: 07/04/2018 CLINICAL DATA:  Right upper quadrant abdominal pain. EXAM: NUCLEAR MEDICINE HEPATOBILIARY IMAGING WITH GALLBLADDER EF TECHNIQUE: Sequential images of the abdomen were obtained out to 60 minutes following  intravenous administration of radiopharmaceutical. After oral ingestion of Ensure, gallbladder ejection fraction was determined. At 60 min, normal ejection fraction is greater than 33%. RADIOPHARMACEUTICALS:  5.3 mCi Tc-32m  Choletec IV COMPARISON:  CT scan of July 01, 2018. FINDINGS: Prompt uptake and biliary excretion of activity by the liver is seen. Gallbladder activity is visualized, consistent with patency of cystic duct. Biliary activity passes into small bowel, consistent with patent common bile duct. Calculated gallbladder ejection fraction is 24%, which is below normal. Patient did report discomfort in her back after ingesting Ensure. (Normal gallbladder ejection fraction with Ensure is greater than 33%.) IMPRESSION: Gallbladder ejection fraction of 24% was noted after Ensure administration, which is below normal and concerning for biliary dyskinesis. Electronically Signed   By: Marijo Conception, M.D.   On: 07/04/2018 13:54    Assessment and Plan:   1. Preoperative evaluation -Patient has a history of A. fib, maintaining sinus rhythm with flecainide.  She has  had normal LV function, last echo in 2015, normal coronary arteries by [facility] in February/2002, Myoview in 02/2014 that showed normal EF and no ischemia. -Pt is without chest discomfort, shortness of breath, orthopnea, lightheadedness or significant edema. She is currently not likely to be able to achieve 4 Mets of activity, she says this is due to back and abdominal pain that has been significantly limiting her. She appears to be deconditioned.  -EKG shows sinus rhythm and no ischemia.  -According to the RCRI risk calculator Ms. Kuba is a class I risk with 0.4% risk of major cardiac event perioperatively.  Thus she is a low risk and can proceed with surgery without further cardiac evaluation. She is at a little increased risk due to her deconditioning.  -Continue beta-blocker and statin perioperatively as tolerated to reduce cardiac  risk.  2. Paroxysmal atrial fibrillation -A. fib is well controlled.  Maintaining sinus rhythm on flecainide. -The patient has been anticoagulated with warfarin for stroke risk reduction.  CHA2DS2/VAS Stroke Risk Score of 4 (HTN, Age (2), female). -Anticoagulation is currently on hold in preparation for potential gallbladder surgery on Friday.  INR is 1.27 today. -Resume anticoagulation postoperatively once deemed safe by surgeon. -May want to have pt on telemetry postoperatively as she is at increased risk for developing afib during that time.    3. Essential hypertension -Home management includes Toprol-XL 100 mg daily -Blood pressure is mildly elevated, 152/65 today.  [name] had noted white coat hypertension in the office that improved with addition of Lasix that she only takes as needed.  Could consider adding another antihypertensive agent, low-dose amlodipine or ARB.  4. Hyperlipidemia -On pravastatin 40 mg daily.  Followed by primary care, no recent lipid panel in epic.  5. Parkinson's disease -On carbidopa/levodopa, followed by Dr. Carles Collet as an outpatient.     For questions or updates, please contact Kenneth Please consult [email or web address] for contact info under     Signed, Daune Perch, NP  07/05/2018 2:36 PM

## 2018-07-06 ENCOUNTER — Ambulatory Visit: Payer: Medicare Other | Admitting: Physical Therapy

## 2018-07-06 LAB — COMPREHENSIVE METABOLIC PANEL
ALT: 7 U/L (ref 0–44)
AST: 23 U/L (ref 15–41)
Albumin: 3.6 g/dL (ref 3.5–5.0)
Alkaline Phosphatase: 82 U/L (ref 38–126)
Anion gap: 7 (ref 5–15)
BUN: 24 mg/dL — ABNORMAL HIGH (ref 8–23)
CO2: 25 mmol/L (ref 22–32)
Calcium: 9 mg/dL (ref 8.9–10.3)
Chloride: 111 mmol/L (ref 98–111)
Creatinine, Ser: 0.98 mg/dL (ref 0.44–1.00)
GFR calc Af Amer: >60 mL/min (ref >60)
GFR calc non Af Amer: 56 mL/min — ABNORMAL LOW (ref >60)
Glucose, Bld: 84 mg/dL (ref 70–99)
Potassium: 4 mmol/L (ref 3.5–5.1)
Sodium: 143 mmol/L (ref 135–145)
Total Bilirubin: 0.6 mg/dL (ref 0.3–1.2)
Total Protein: 6.5 g/dL (ref 6.5–8.1)

## 2018-07-06 LAB — CULTURE, BLOOD (ROUTINE X 2)
Culture: NO GROWTH
Culture: NO GROWTH
Special Requests: ADEQUATE

## 2018-07-06 LAB — CBC WITH DIFFERENTIAL/PLATELET
Abs Immature Granulocytes: 0.02 10*3/uL (ref 0.00–0.07)
Basophils Absolute: 0 10*3/uL (ref 0.0–0.1)
Basophils Relative: 1 %
Eosinophils Absolute: 0.2 10*3/uL (ref 0.0–0.5)
Eosinophils Relative: 4 %
HCT: 38.5 % (ref 36.0–46.0)
Hemoglobin: 11.7 g/dL — ABNORMAL LOW (ref 12.0–15.0)
Immature Granulocytes: 1 %
Lymphocytes Relative: 24 %
Lymphs Abs: 1 10*3/uL (ref 0.7–4.0)
MCH: 26.4 pg (ref 26.0–34.0)
MCHC: 30.4 g/dL (ref 30.0–36.0)
MCV: 86.9 fL (ref 80.0–100.0)
Monocytes Absolute: 0.3 10*3/uL (ref 0.1–1.0)
Monocytes Relative: 8 %
Neutro Abs: 2.7 10*3/uL (ref 1.7–7.7)
Neutrophils Relative %: 62 %
Platelets: 141 10*3/uL — ABNORMAL LOW (ref 150–400)
RBC: 4.43 MIL/uL (ref 3.87–5.11)
RDW: 15.7 % — ABNORMAL HIGH (ref 11.5–15.5)
WBC: 4.3 10*3/uL (ref 4.0–10.5)
nRBC: 0 % (ref 0.0–0.2)

## 2018-07-06 LAB — PHOSPHORUS: Phosphorus: 3.6 mg/dL (ref 2.5–4.6)

## 2018-07-06 LAB — HEPARIN LEVEL (UNFRACTIONATED)
Heparin Unfractionated: 0.95 IU/mL — ABNORMAL HIGH (ref 0.30–0.70)
Heparin Unfractionated: 0.91 IU/mL — ABNORMAL HIGH (ref 0.30–0.70)
Heparin Unfractionated: 0.55 IU/mL (ref 0.30–0.70)

## 2018-07-06 LAB — PROTIME-INR
INR: 1.35
Prothrombin Time: 16.5 seconds — ABNORMAL HIGH (ref 11.4–15.2)

## 2018-07-06 LAB — SURGICAL PCR SCREEN
MRSA, PCR: NEGATIVE
Staphylococcus aureus: NEGATIVE

## 2018-07-06 LAB — MAGNESIUM: Magnesium: 1.9 mg/dL (ref 1.7–2.4)

## 2018-07-06 MED ORDER — POLYETHYLENE GLYCOL 3350 17 G PO PACK
17.0000 g | PACK | Freq: Every day | ORAL | Status: DC
  Administered 2018-07-06 – 2018-07-09 (×2): 17 g via ORAL
  Filled 2018-07-06: qty 1

## 2018-07-06 MED ORDER — CEFAZOLIN SODIUM-DEXTROSE 2-4 GM/100ML-% IV SOLN
2.0000 g | Freq: Once | INTRAVENOUS | Status: AC
  Administered 2018-07-07: 2 g via INTRAVENOUS
  Filled 2018-07-06: qty 100

## 2018-07-06 NOTE — Progress Notes (Signed)
ANTICOAGULATION CONSULT NOTE - Follow Up Consult  Pharmacy Consult for heparin Indication: atrial fibrillation  Labs: Recent Labs    07/03/18 0645 07/05/18 0351 07/05/18 0955 07/06/18 0157  HGB  --   --  13.6  --   HCT  --   --  42.7  --   PLT  --   --  161  --   LABPROT 18.9* 15.8*  --   --   INR 1.60 1.27  --   --   HEPARINUNFRC  --   --   --  0.95*  CREATININE  --   --  0.92  --     Assessment: 77yo female supratherapeutic on heparin with initial dosing while Coumadin on hold; no gtt issues or signs of bleeding per RN.  Goal of Therapy:  Heparin level 0.3-0.7 units/ml   Plan:  Will decrease heparin gtt by 2-3 units/kg/hr to 1000 units/hr and check level in 8 hours.    Wynona Neat, PharmD, BCPS  07/06/2018,3:14 AM

## 2018-07-06 NOTE — Progress Notes (Signed)
ANTICOAGULATION CONSULT NOTE  Pharmacy Consult:  Heparin Indication: atrial fibrillation  Allergies  Allergen Reactions  . Lyrica [Pregabalin] Other (See Comments)    Felt loopy  . Zocor [Simvastatin] Other (See Comments)    Myalgias  . Relafen [Nabumetone] Rash    Patient Measurements: Height: 5\' 5"  (165.1 cm) Weight: 185 lb 3 oz (84 kg) IBW/kg (Calculated) : 57  Heparin dosing weight = 84 kg  Vital Signs: Temp: 97.8 F (36.6 C) (01/30 1036) Temp Source: Oral (01/30 1036) BP: 131/54 (01/30 1036) Pulse Rate: 59 (01/30 1036)  Labs: Recent Labs    07/05/18 0351 07/05/18 0955 07/06/18 0157 07/06/18 0627 07/06/18 1104  HGB  --  13.6  --  11.7*  --   HCT  --  42.7  --  38.5  --   PLT  --  161  --  141*  --   LABPROT 15.8*  --   --  16.5*  --   INR 1.27  --   --  1.35  --   HEPARINUNFRC  --   --  0.95*  --  0.91*  CREATININE  --  0.92  --  0.98  --     Estimated Creatinine Clearance: 53.1 mL/min (by C-G formula based on SCr of 0.98 mg/dL).   Assessment: 65 YOF with history of Afib on Coumadin PTA.  Patient presented with worsening low back pain and abdominal pain.  Pharmacy now consulted to dose IV heparin while Coumadin is on hold for surgery (last Coumadin dose on 07/03/18).   Heparin level is supra-therapeutic.  Lab was obtained on the same arm as the heparin infusion.  Although it was drawn distal to the IV site, there is still a possibility of falsely elevated heparin levels.  No bleeding per patient.  Goal of Therapy:  Heparin level 0.3-0.7 units/mL Monitor platelets by anticoagulation protocol: Yes   Plan:  Reduce heparin gtt to 800 units/hr Check 8 hr heparin level Daily heparin level and CBC  Leilanie Rauda D. Mina Marble, PharmD, BCPS, [place] 07/06/2018, 12:58 PM

## 2018-07-06 NOTE — Anesthesia Preprocedure Evaluation (Addendum)
Anesthesia Evaluation  Patient identified by MRN, date of birth, ID band Patient awake    Reviewed: Allergy & Precautions, NPO status , Patient's Chart, lab work & pertinent test results  History of Anesthesia Complications Negative for: history of anesthetic complications  Airway Mallampati: II  TM Distance: >3 FB Neck ROM: Full    Dental  (+) Teeth Intact, Dental Advisory Given   Pulmonary sleep apnea ,    Pulmonary exam normal breath sounds clear to auscultation       Cardiovascular hypertension, Pt. on medications and Pt. on home beta blockers Normal cardiovascular exam+ dysrhythmias (on Coumadin and flecainide) Atrial Fibrillation  Rhythm:Regular Rate:Normal  TTE 2015: EF 60-65%    Neuro/Psych negative neurological ROS     GI/Hepatic Neg liver ROS, GERD  Medicated and Controlled,  Endo/Other  negative endocrine ROS  Renal/GU Renal InsufficiencyRenal disease     Musculoskeletal  (+) Arthritis ,   Abdominal   Peds  Hematology negative hematology ROS (+)   Anesthesia Other Findings Day of surgery medications reviewed with the patient.  Reproductive/Obstetrics                            Anesthesia Physical Anesthesia Plan  ASA: III  Anesthesia Plan: General   Post-op Pain Management:    Induction: Intravenous  PONV Risk Score and Plan: 3 and Treatment may vary due to age or medical condition, Ondansetron and Dexamethasone  Airway Management Planned: Oral ETT  Additional Equipment:   Intra-op Plan:   Post-operative Plan: Extubation in OR  Informed Consent: I have reviewed the patients History and Physical, chart, labs and discussed the procedure including the risks, benefits and alternatives for the proposed anesthesia with the patient or authorized representative who has indicated his/her understanding and acceptance.     Dental advisory given  Plan Discussed with:  CRNA  Anesthesia Plan Comments:        Anesthesia Quick Evaluation

## 2018-07-06 NOTE — Plan of Care (Signed)
  Problem: Education: Goal: Knowledge of General Education information will improve Description Including pain rating scale, medication(s)/side effects and non-pharmacologic comfort measures Outcome: Progressing   Problem: Clinical Measurements: Goal: Respiratory complications will improve Outcome: Not Applicable Goal: Cardiovascular complication will be avoided Outcome: Progressing   Problem: Activity: Goal: Risk for activity intolerance will decrease Outcome: Progressing   Problem: Nutrition: Goal: Adequate nutrition will be maintained Outcome: Progressing   Problem: Coping: Goal: Level of anxiety will decrease Outcome: Progressing

## 2018-07-06 NOTE — H&P (View-Only) (Signed)
[facility] Progress Note     Subjective: CC: abdominal pain Patient reports abdominal pain across lower abdomen and RUQ. Tolerating diet and denies nausea overnight. Having some loose stools today. Discussed plan for surgery tomorrow and will notify family when we have an idea of what time that will be around.   Objective: Vital signs in last 24 hours: Temp:  [97.5 F (36.4 C)-97.9 F (36.6 C)] 97.5 F (36.4 C) (01/30 0432) Pulse Rate:  [62-65] 65 (01/30 0432) Resp:  [15] 15 (01/30 0432) BP: (152-160)/(61-69) 160/69 (01/30 0432) SpO2:  [98 %-99 %] 98 % (01/30 0432) Last BM Date: 07/05/18  Intake/Output from previous day: 01/29 0701 - 01/30 0700 In: 120.3 [P.O.:120; I.V.:0.3] Out: -  Intake/Output this shift: No intake/output data recorded.  PE: Gen:  Alert, NAD, pleasant Card:  Regular rate and rhythm Pulm:  Normal effort, clear to auscultation bilaterally Abd: Soft, mildly TTP in RUQ and lower abdomen, non-distended, bowel sounds present, no HSM Skin: warm and dry, no rashes  Psych: A&Ox3   Lab Results:  Recent Labs    07/05/18 0955 07/06/18 0627  WBC 5.0 4.3  HGB 13.6 11.7*  HCT 42.7 38.5  PLT 161 141*   BMET Recent Labs    07/05/18 0955 07/06/18 0627  NA 140 143  K 4.2 4.0  CL 107 111  CO2 26 25  GLUCOSE 97 84  BUN 23 24*  CREATININE 0.92 0.98  CALCIUM 9.6 9.0   PT/INR Recent Labs    07/05/18 0351 07/06/18 0627  LABPROT 15.8* 16.5*  INR 1.27 1.35   CMP     Component Value Date/Time   NA 143 07/06/2018 0627   K 4.0 07/06/2018 0627   CL 111 07/06/2018 0627   CO2 25 07/06/2018 0627   GLUCOSE 84 07/06/2018 0627   BUN 24 (H) 07/06/2018 0627   CREATININE 0.98 07/06/2018 0627   CALCIUM 9.0 07/06/2018 0627   PROT 6.5 07/06/2018 0627   ALBUMIN 3.6 07/06/2018 0627   AST 23 07/06/2018 0627   ALT 7 07/06/2018 0627   ALKPHOS 82 07/06/2018 0627   BILITOT 0.6 07/06/2018 0627   GFRNONAA 56 (L) 07/06/2018 0627   GFRAA >60 07/06/2018  0627   Lipase     Component Value Date/Time   LIPASE 30 07/01/2018 0057       Studies/Results: Nm Hepato W/eject Fract  Result Date: 07/04/2018 CLINICAL DATA:  Right upper quadrant abdominal pain. EXAM: NUCLEAR MEDICINE HEPATOBILIARY IMAGING WITH GALLBLADDER EF TECHNIQUE: Sequential images of the abdomen were obtained out to 60 minutes following intravenous administration of radiopharmaceutical. After oral ingestion of Ensure, gallbladder ejection fraction was determined. At 60 min, normal ejection fraction is greater than 33%. RADIOPHARMACEUTICALS:  5.3 mCi Tc-57m  Choletec IV COMPARISON:  CT scan of July 01, 2018. FINDINGS: Prompt uptake and biliary excretion of activity by the liver is seen. Gallbladder activity is visualized, consistent with patency of cystic duct. Biliary activity passes into small bowel, consistent with patent common bile duct. Calculated gallbladder ejection fraction is 24%, which is below normal. Patient did report discomfort in her back after ingesting Ensure. (Normal gallbladder ejection fraction with Ensure is greater than 33%.) IMPRESSION: Gallbladder ejection fraction of 24% was noted after Ensure administration, which is below normal and concerning for biliary dyskinesis. Electronically Signed   By: Marijo Conception, M.D.   On: 07/04/2018 13:54    Anti-infectives: Anti-infectives (From admission, onward)   Start     Dose/Rate Route Frequency Ordered  Stop   07/03/18 0400  vancomycin (VANCOCIN) 1,500 mg in sodium chloride 0.9 % 500 mL IVPB  Status:  Discontinued     1,500 mg 250 mL/hr over 120 Minutes Intravenous Every 36 hours 07/01/18 1540 07/02/18 0912   07/01/18 1545  ceFEPIme (MAXIPIME) 1 g in sodium chloride 0.9 % 100 mL IVPB  Status:  Discontinued     1 g 200 mL/hr over 30 Minutes Intravenous Every 12 hours 07/01/18 1540 07/02/18 0912   07/01/18 1545  vancomycin (VANCOCIN) 1,750 mg in sodium chloride 0.9 % 500 mL IVPB     1,750 mg 250 mL/hr over 120  Minutes Intravenous  Once 07/01/18 1540 07/02/18 1820       Assessment/Plan PAF on Coumadin - INR 1.35 HTN HLD CHF  Abdominal Pain Biliary dyskinesia  - unclear whether all symptoms are related to gallbladder but some symptoms certainly seem biliary - this has been discussed with patient and family who understand this may not resolve symptoms but wish to proceed - No evidence of cholecystitis - cardiology cleared for surgery  - OR tomorrow for lap chole   FEN - Regular, NPO after MN VTE - heparin gtt - hold tomorrow AM ID - Vanc & Maxipime 1/25 - 1/26   LOS: 5 days    Brigid Re , [facility] 07/06/2018, 8:48 AM Pager: [phone] Consults: [phone] Mon-Fri 7:00 am-4:30 pm Sat-Sun 7:00 am-11:30 am

## 2018-07-06 NOTE — Progress Notes (Signed)
PROGRESS NOTE    [name]  DSK:[id number] DOB: 19-Jun-1940 DOA: 07/01/2018 PCP: [name], MD   Brief Narrative:  [name] is a 78 y.o.femalewith medical history significant ofParkinson's disease, congestive heart failure with preserved ejection fraction, chronic back pain, degenerative joint disease, gastroesophageal reflux disease, hyperlipidemia, hypertension and paroxysmal atrial fibrillation on warfarin who presents for evaluation of worsening onset of progressively worsening low back pain and abdominal pain which has been present for at least 3 months.   Thoracolumbar MRI> several indicators of discitis-osteomyelitis at the T12-L1 level. The patient was started on Vanc and Cefepime and admitted. Further workup deemed that she likely did not have a discitis/Osteomyelitis and Abx were D/C'd. Patient was going to be D/C'd but continued to complain of Abdominal Pain so GI was consulted and a HIDA Scan was done which showed a low EF. General Surgery was consulted and plan on taking the patient for a cholecystectomy Friday. Cardiology was consulted for preoperative Clearance and she has been cleared for Surgery.   Assessment & Plan:   Principal Problem:   Discitis of thoracolumbar region Active Problems:   Osteoarthritis   Obstructive sleep apnea   Atrial fibrillation with RVR (HCC)   Chronic anticoagulation -warfarin therapy   Parkinsonism (HCC)   Preoperative cardiovascular examination  Chronic Back Pain Discitis of thoracolumbar region? - s/p multiple back surgeries by neuro surgery- pain appears to be chronic to me ? Discitison MRI- patient is not tender in the area of supposed discitis- Dr. Wynelle Cleveland discontinued the antibiotics andasked for a Neurosurgeryand ID opinion - the conclusion is that it is not likely she has a discitis and at this time does not need further work up  -in regards to her chronic back-she states she was prescribed PT which  exacerbated this(thus no PT ordered again) - she states the pain moves around and there is no radiation down her legs and no other neurological symptoms - there is moderate thoracic stenosis on the MRIwhich Dr. Wynelle Cleveland discussed with NS-NS does not feel it needs surgery or further inpatient management  It appears to be muscular pain on my exam and she was started on Voltaren Gel -C/w Oxycodone IR 10 mg q4hprn Severe Pain and 5 mg po q4hprn  Abdominal Bloating and Pain when eating Biliary Dyskinesia  -GI consulted- her pain did not seem consistent with gallbladder disease but a HIDA scan was ordered- it shows a low EF; Some symptoms may be attributable to the Gallbladder but certainly not all -GI discussed with the patient that her pain may persist even with the removal of the gallbladder -General Surgery consulted and plan is for Cholecystectomy on Friday 07/07/2018 -C/w Pantoprazole 40 mg po BID  -Cardiology Consulted for Pre-operative Clearance    Obstructive Sleep Apnea -Does not use CPAP  Paroxysmal A Fib  -Cont Flecainide 50 mg po BID  -Coumadin per pharmacy stopped and will changed to IV Heparin for Surgery on Friday; Per General Surgery hold tomorrow AM -Cardiology consulted for Pre-operative Clearance  -Recommending Resuming Coumadin post-operatively when safe by Surgery  Thrombocytopenia -Appears to be chronic -Platelet Count went from 130 -> 161 -> 141 -Continue to Monitor for S/Sx of Bleeding -Repeat CBC in AM   Parkinsonism -Continue Sinemet IR 25-100 2 tab po 4 times daily   Obesity -Estimated body mass index is 30.82 kg/m as calculated from the following:   Height as of this encounter: 5\' 5"  (1.651 m).   Weight as of this encounter: 5  kg. -Weight Loss Counseling given   HLD -C/w Pravastatin 40 mg po qHS  HTN -C/w Metoprolol Succinate 100 mg po Daily  -Continue to Monitor Blood Pressures carefully  Obesity -Estimated body mass index is 30.82 kg/m as  calculated from the following:   Height as of this encounter: 5\' 5"  (1.651 m).   Weight as of this encounter: 84 kg. -Weight Loss Counseling given   Normocytic Anemia -Patient's Hb/Hct stable at 11.7/38.5 -Continue to Monitor for S/Sx of Bleeding as patient is on Anticoagulation -Repeat CBC in AM   DVT prophylaxis: Anticoagulated with Heparin gtt Code Status: FULL CODE  Family Communication: No family present Disposition Plan: Pending Surgical Clearance   Consultants:   Cardiology  Gastroenterology  General Surgery  Neurosurgery  Infectious Diseases   Procedures: HIDA Scan   Antimicrobials:  Anti-infectives (From admission, onward)   Start     Dose/Rate Route Frequency Ordered Stop   07/07/18 0600  ceFAZolin (ANCEF) IVPB 2g/100 mL premix     2 g 200 mL/hr over 30 Minutes Intravenous  Once 07/06/18 0920     07/03/18 0400  vancomycin (VANCOCIN) 1,500 mg in sodium chloride 0.9 % 500 mL IVPB  Status:  Discontinued     1,500 mg 250 mL/hr over 120 Minutes Intravenous Every 36 hours 07/01/18 1540 07/02/18 0912   07/01/18 1545  ceFEPIme (MAXIPIME) 1 g in sodium chloride 0.9 % 100 mL IVPB  Status:  Discontinued     1 g 200 mL/hr over 30 Minutes Intravenous Every 12 hours 07/01/18 1540 07/02/18 0912   07/01/18 1545  vancomycin (VANCOCIN) 1,750 mg in sodium chloride 0.9 % 500 mL IVPB     1,750 mg 250 mL/hr over 120 Minutes Intravenous  Once 07/01/18 1540 07/02/18 1820     Subjective: Seen and examined and still complaining of some Abdominal Pain that radiates across the abdomen. Also states she had "light nausea." No CP or SOB. Still has back pain.   Objective: Vitals:   07/05/18 0853 07/05/18 2028 07/06/18 0432 07/06/18 1036  BP: (!) 152/65 (!) 156/61 (!) 160/69 (!) 131/54  Pulse: 62 62 65 (!) 59  Resp:  15 15 17   Temp:  97.9 F (36.6 C) (!) 97.5 F (36.4 C) 97.8 F (36.6 C)  TempSrc:  Oral Oral Oral  SpO2:  99% 98% 97%  Weight:      Height:         Intake/Output Summary (Last 24 hours) at 07/06/2018 1239 Last data filed at 07/06/2018 0900 Gross per 24 hour  Intake 360.31 ml  Output -  Net 360.31 ml   Filed Weights   07/01/18 1800 07/01/18 2300  Weight: 84.8 kg 84 kg   Examination: Physical Exam:  Constitutional: Well-nourished, well-developed obese Caucasian female currently no acute distress appears calm but still appears uncomfortable Eyes: Lids and conjunctive are normal.  Sclera anicteric ENMT: The external ears nose appear normal.  Grossly normal hearing.  Mucous members are moist Neck: Appears supple with no JVD Respiratory: Diminished auscultation bilaterally no appreciable wheezing, rales, rhonchi.  Patient not tachypneic or using any accessory muscles to breathe Cardiovascular: Regular rate and rhythm.  No appreciable murmurs, rubs, gallops.  Has mild lower extremity edema Abdomen: Soft, tender to palpate in the right side of the upper and lower quadrants.  Distended secondary body habitus.  Bowel sounds present all 4 quadrants GU: Deferred Musculoskeletal: No contractures or cyanosis.  No joint deformities in the upper and lower extremities Skin: No appreciable rashes  or lesions on limited skin evaluation Neurologic: Cranial nerves II through XII grossly intact with no appreciable focal deficits.  Romberg sign and cerebellar reflexes were not assessed Psychiatric: Normal judgment and insight.  Patient is awake and alert and oriented x3.  Normal mood and affect  Data Reviewed: I have personally reviewed following labs and imaging studies  CBC: Recent Labs  Lab 07/01/18 0057 07/02/18 0438 07/05/18 0955 07/06/18 0627  WBC 5.6 4.4 5.0 4.3  NEUTROABS  --   --  3.7 2.7  HGB 12.0 11.7* 13.6 11.7*  HCT 40.3 37.0 42.7 38.5  MCV 87.8 85.5 85.7 86.9  PLT 145* 130* 161 130*   Basic Metabolic Panel: Recent Labs  Lab 07/01/18 0057 07/02/18 0438 07/05/18 0955 07/06/18 0627  NA 138 139 140 143  K 4.2 3.9 4.2 4.0   CL 104 106 107 111  CO2 24 23 26 25   GLUCOSE 120* 97 97 84  BUN 22 16 23  24*  CREATININE 1.27* 0.93 0.92 0.98  CALCIUM 9.2 8.7* 9.6 9.0  MG  --   --  2.0 1.9  PHOS  --   --  3.2 3.6   GFR: Estimated Creatinine Clearance: 53.1 mL/min (by C-G formula based on SCr of 0.98 mg/dL). Liver Function Tests: Recent Labs  Lab 07/01/18 0057 07/05/18 0955 07/06/18 0627  AST 16 18 23   ALT 5 <5 7  ALKPHOS 99 93 82  BILITOT 0.4 0.7 0.6  PROT 6.8 7.2 6.5  ALBUMIN 4.0 3.9 3.6   Recent Labs  Lab 07/01/18 0057  LIPASE 30   No results for input(s): AMMONIA in the last 168 hours. Coagulation Profile: Recent Labs  Lab 07/01/18 0830 07/02/18 0438 07/03/18 0645 07/05/18 0351 07/06/18 0627  INR 2.34 2.06 1.60 1.27 1.35   Cardiac Enzymes: No results for input(s): CKTOTAL, CKMB, CKMBINDEX, TROPONINI in the last 168 hours. BNP (last 3 results) No results for input(s): PROBNP in the last 8760 hours. HbA1C: No results for input(s): HGBA1C in the last 72 hours. CBG: No results for input(s): GLUCAP in the last 168 hours. Lipid Profile: No results for input(s): CHOL, HDL, LDLCALC, TRIG, CHOLHDL, LDLDIRECT in the last 72 hours. Thyroid Function Tests: No results for input(s): TSH, T4TOTAL, FREET4, T3FREE, THYROIDAB in the last 72 hours. Anemia Panel: No results for input(s): VITAMINB12, FOLATE, FERRITIN, TIBC, IRON, RETICCTPCT in the last 72 hours. Sepsis Labs: No results for input(s): PROCALCITON, LATICACIDVEN in the last 168 hours.  Recent Results (from the past 240 hour(s))  Blood culture (routine x 2)     Status: None   Collection Time: 07/01/18  8:15 AM  Result Value Ref Range Status   Specimen Description BLOOD LEFT WRIST  Final   Special Requests   Final    BOTTLES DRAWN AEROBIC AND ANAEROBIC Blood Culture results may not be optimal due to an inadequate volume of blood received in culture bottles   Culture   Final    NO GROWTH 5 DAYS Performed at [facility], [place] [address] [place], [place] [address]    Report Status 07/06/2018 FINAL  Final  Blood culture (routine x 2)     Status: None   Collection Time: 07/01/18  8:30 AM  Result Value Ref Range Status   Specimen Description BLOOD RIGHT ANTECUBITAL  Final   Special Requests   Final    BOTTLES DRAWN AEROBIC AND ANAEROBIC Blood Culture adequate volume   Culture   Final    NO GROWTH 5  DAYS Performed at [facility], [place] [address], [place], [place] [address]    Report Status 07/06/2018 FINAL  Final    Radiology Studies: Nm Hepato W/eject Fract  Result Date: 07/04/2018 CLINICAL DATA:  Right upper quadrant abdominal pain. EXAM: NUCLEAR MEDICINE HEPATOBILIARY IMAGING WITH GALLBLADDER EF TECHNIQUE: Sequential images of the abdomen were obtained out to 60 minutes following intravenous administration of radiopharmaceutical. After oral ingestion of Ensure, gallbladder ejection fraction was determined. At 60 min, normal ejection fraction is greater than 33%. RADIOPHARMACEUTICALS:  5.3 mCi Tc-71m  Choletec IV COMPARISON:  CT scan of July 01, 2018. FINDINGS: Prompt uptake and biliary excretion of activity by the liver is seen. Gallbladder activity is visualized, consistent with patency of cystic duct. Biliary activity passes into small bowel, consistent with patent common bile duct. Calculated gallbladder ejection fraction is 24%, which is below normal. Patient did report discomfort in her back after ingesting Ensure. (Normal gallbladder ejection fraction with Ensure is greater than 33%.) IMPRESSION: Gallbladder ejection fraction of 24% was noted after Ensure administration, which is below normal and concerning for biliary dyskinesis. Electronically Signed   By: Marijo Conception, M.D.   On: 07/04/2018 13:54   Scheduled Meds: . acetaminophen  650 mg Oral Q6H  . acidophilus  1 capsule Oral Daily  . carbidopa-levodopa  2 tablet Oral QID  . chlorzoxazone  500 mg Oral BID  . dicyclomine  20 mg Oral TID AC  .  flecainide  50 mg Oral BID  . gabapentin  300 mg Oral QHS  . latanoprost  1 drop Both Eyes QHS  . lidocaine  1 patch Transdermal Q24H  . metoprolol succinate  100 mg Oral Daily  . pantoprazole  40 mg Oral BID  . polyethylene glycol  17 g Oral Daily  . potassium chloride SA  40 mEq Oral Daily  . pramipexole  0.5 mg Oral QHS  . pravastatin  40 mg Oral QHS  . senna  1 tablet Oral BID   Continuous Infusions: . [START ON 07/07/2018]  ceFAZolin (ANCEF) IV    . heparin 1,000 Units/hr (07/06/18 0315)     LOS: 5 days    Kerney Elbe, DO [facility] PAGER is on AMION  If 7PM-7AM, please contact night-coverage [email or web address] Password [facility] 07/06/2018, 12:39 PM

## 2018-07-06 NOTE — Progress Notes (Signed)
[facility] Progress Note     Subjective: CC: abdominal pain Patient reports abdominal pain across lower abdomen and RUQ. Tolerating diet and denies nausea overnight. Having some loose stools today. Discussed plan for surgery tomorrow and will notify family when we have an idea of what time that will be around.   Objective: Vital signs in last 24 hours: Temp:  [97.5 F (36.4 C)-97.9 F (36.6 C)] 97.5 F (36.4 C) (01/30 0432) Pulse Rate:  [62-65] 65 (01/30 0432) Resp:  [15] 15 (01/30 0432) BP: (152-160)/(61-69) 160/69 (01/30 0432) SpO2:  [98 %-99 %] 98 % (01/30 0432) Last BM Date: 07/05/18  Intake/Output from previous day: 01/29 0701 - 01/30 0700 In: 120.3 [P.O.:120; I.V.:0.3] Out: -  Intake/Output this shift: No intake/output data recorded.  PE: Gen:  Alert, NAD, pleasant Card:  Regular rate and rhythm Pulm:  Normal effort, clear to auscultation bilaterally Abd: Soft, mildly TTP in RUQ and lower abdomen, non-distended, bowel sounds present, no HSM Skin: warm and dry, no rashes  Psych: A&Ox3   Lab Results:  Recent Labs    07/05/18 0955 07/06/18 0627  WBC 5.0 4.3  HGB 13.6 11.7*  HCT 42.7 38.5  PLT 161 141*   BMET Recent Labs    07/05/18 0955 07/06/18 0627  NA 140 143  K 4.2 4.0  CL 107 111  CO2 26 25  GLUCOSE 97 84  BUN 23 24*  CREATININE 0.92 0.98  CALCIUM 9.6 9.0   PT/INR Recent Labs    07/05/18 0351 07/06/18 0627  LABPROT 15.8* 16.5*  INR 1.27 1.35   CMP     Component Value Date/Time   NA 143 07/06/2018 0627   K 4.0 07/06/2018 0627   CL 111 07/06/2018 0627   CO2 25 07/06/2018 0627   GLUCOSE 84 07/06/2018 0627   BUN 24 (H) 07/06/2018 0627   CREATININE 0.98 07/06/2018 0627   CALCIUM 9.0 07/06/2018 0627   PROT 6.5 07/06/2018 0627   ALBUMIN 3.6 07/06/2018 0627   AST 23 07/06/2018 0627   ALT 7 07/06/2018 0627   ALKPHOS 82 07/06/2018 0627   BILITOT 0.6 07/06/2018 0627   GFRNONAA 56 (L) 07/06/2018 0627   GFRAA >60 07/06/2018  0627   Lipase     Component Value Date/Time   LIPASE 30 07/01/2018 0057       Studies/Results: Nm Hepato W/eject Fract  Result Date: 07/04/2018 CLINICAL DATA:  Right upper quadrant abdominal pain. EXAM: NUCLEAR MEDICINE HEPATOBILIARY IMAGING WITH GALLBLADDER EF TECHNIQUE: Sequential images of the abdomen were obtained out to 60 minutes following intravenous administration of radiopharmaceutical. After oral ingestion of Ensure, gallbladder ejection fraction was determined. At 60 min, normal ejection fraction is greater than 33%. RADIOPHARMACEUTICALS:  5.3 mCi Tc-63m  Choletec IV COMPARISON:  CT scan of July 01, 2018. FINDINGS: Prompt uptake and biliary excretion of activity by the liver is seen. Gallbladder activity is visualized, consistent with patency of cystic duct. Biliary activity passes into small bowel, consistent with patent common bile duct. Calculated gallbladder ejection fraction is 24%, which is below normal. Patient did report discomfort in her back after ingesting Ensure. (Normal gallbladder ejection fraction with Ensure is greater than 33%.) IMPRESSION: Gallbladder ejection fraction of 24% was noted after Ensure administration, which is below normal and concerning for biliary dyskinesis. Electronically Signed   By: Marijo Conception, M.D.   On: 07/04/2018 13:54    Anti-infectives: Anti-infectives (From admission, onward)   Start     Dose/Rate Route Frequency Ordered  Stop   07/03/18 0400  vancomycin (VANCOCIN) 1,500 mg in sodium chloride 0.9 % 500 mL IVPB  Status:  Discontinued     1,500 mg 250 mL/hr over 120 Minutes Intravenous Every 36 hours 07/01/18 1540 07/02/18 0912   07/01/18 1545  ceFEPIme (MAXIPIME) 1 g in sodium chloride 0.9 % 100 mL IVPB  Status:  Discontinued     1 g 200 mL/hr over 30 Minutes Intravenous Every 12 hours 07/01/18 1540 07/02/18 0912   07/01/18 1545  vancomycin (VANCOCIN) 1,750 mg in sodium chloride 0.9 % 500 mL IVPB     1,750 mg 250 mL/hr over 120  Minutes Intravenous  Once 07/01/18 1540 07/02/18 1820       Assessment/Plan PAF on Coumadin - INR 1.35 HTN HLD CHF  Abdominal Pain Biliary dyskinesia  - unclear whether all symptoms are related to gallbladder but some symptoms certainly seem biliary - this has been discussed with patient and family who understand this may not resolve symptoms but wish to proceed - No evidence of cholecystitis - cardiology cleared for surgery  - OR tomorrow for lap chole   FEN - Regular, NPO after MN VTE - heparin gtt - hold tomorrow AM ID - Vanc & Maxipime 1/25 - 1/26   LOS: 5 days    Brigid Re , [facility] 07/06/2018, 8:48 AM Pager: [phone] Consults: [phone] Mon-Fri 7:00 am-4:30 pm Sat-Sun 7:00 am-11:30 am

## 2018-07-06 NOTE — Progress Notes (Signed)
[facility] PROGRESS NOTE Subjective Patient has been seen by cardiology and surgery.  Plan is to proceed with laparoscopic cholecystectomy tomorrow.  She states that she is still having pain on the right side and on the left side as well as in the back.  Objective: Vital signs in last 24 hours: Temp:  [97.5 F (36.4 C)-97.9 F (36.6 C)] 97.5 F (36.4 C) (01/30 0432) Pulse Rate:  [62-65] 65 (01/30 0432) Resp:  [15] 15 (01/30 0432) BP: (152-160)/(61-69) 160/69 (01/30 0432) SpO2:  [98 %-99 %] 98 % (01/30 0432) Last BM Date: 07/05/18  Intake/Output from previous day: 01/29 0701 - 01/30 0700 In: 120.3 [P.O.:120; I.V.:0.3] Out: -  Intake/Output this shift: No intake/output data recorded.  PE: General--laying in bed in no distress  Abdomen--nondistended with minimal abdominal tenderness good bowel sounds  Lab Results: Recent Labs    07/05/18 0955 07/06/18 0627  WBC 5.0 4.3  HGB 13.6 11.7*  HCT 42.7 38.5  PLT 161 141*   BMET Recent Labs    07/05/18 0955 07/06/18 0627  NA 140 143  K 4.2 4.0  CL 107 111  CO2 26 25  CREATININE 0.92 0.98   LFT Recent Labs    07/05/18 0955 07/06/18 0627  PROT 7.2 6.5  AST 18 23  ALT <5 7  ALKPHOS 93 82  BILITOT 0.7 0.6   PT/INR Recent Labs    07/05/18 0351 07/06/18 0627  LABPROT 15.8* 16.5*  INR 1.27 1.35   PANCREAS No results for input(s): LIPASE in the last 72 hours.       Studies/Results: Nm Hepato W/eject Fract  Result Date: 07/04/2018 CLINICAL DATA:  Right upper quadrant abdominal pain. EXAM: NUCLEAR MEDICINE HEPATOBILIARY IMAGING WITH GALLBLADDER EF TECHNIQUE: Sequential images of the abdomen were obtained out to 60 minutes following intravenous administration of radiopharmaceutical. After oral ingestion of Ensure, gallbladder ejection fraction was determined. At 60 min, normal ejection fraction is greater than 33%. RADIOPHARMACEUTICALS:  5.3 mCi Tc-37m  Choletec IV COMPARISON:  CT scan of July 01, 2018. FINDINGS: Prompt uptake and biliary excretion of activity by the liver is seen. Gallbladder activity is visualized, consistent with patency of cystic duct. Biliary activity passes into small bowel, consistent with patent common bile duct. Calculated gallbladder ejection fraction is 24%, which is below normal. Patient did report discomfort in her back after ingesting Ensure. (Normal gallbladder ejection fraction with Ensure is greater than 33%.) IMPRESSION: Gallbladder ejection fraction of 24% was noted after Ensure administration, which is below normal and concerning for biliary dyskinesis. Electronically Signed   By: Marijo Conception, M.D.   On: 07/04/2018 13:54    Medications: I have reviewed the patient's current medications.  Assessment:   1.  Abdominal pain.  The patient actually has 2 types of abdominal pain as well as back pain probably chronic.  She has some left lower quadrant discomfort as well as right upper quadrant discomfort.  Some of this is worsened by eating and the gallbladder function is poor.  She is acceptable risk for cholecystectomy.  I again had a long and frank talk with the patient that her pain may not be due to the gallbladder at all and she is likely to continue with some pain following removal of the gallbladder.   Plan: We will not follow the patient on a regular basis.  She is for cholecystectomy tomorrow.  After discharge, please have her follow-up with Dr. Cristina Gong her regular gastroenterologist in 2 to 3 weeks.  Nancy Fetter 07/06/2018, 7:52 AM  This note was created using voice recognition software. Minor errors may Have occurred unintentionally.  Pager: [phone] If no answer or after hours call [phone]

## 2018-07-07 ENCOUNTER — Inpatient Hospital Stay (HOSPITAL_COMMUNITY): Payer: Medicare Other | Admitting: Anesthesiology

## 2018-07-07 ENCOUNTER — Encounter (HOSPITAL_COMMUNITY): Payer: Self-pay

## 2018-07-07 ENCOUNTER — Encounter (HOSPITAL_COMMUNITY)
Admission: EM | Disposition: A | Discharge status: Home Health | Payer: Self-pay | Source: Home / Self Care | Attending: Internal Medicine

## 2018-07-07 DIAGNOSIS — I10 Essential (primary) hypertension: Secondary | ICD-10-CM | POA: Diagnosis not present

## 2018-07-07 DIAGNOSIS — K828 Other specified diseases of gallbladder: Secondary | ICD-10-CM | POA: Diagnosis not present

## 2018-07-07 HISTORY — PX: CHOLECYSTECTOMY: SHX55

## 2018-07-07 LAB — COMPREHENSIVE METABOLIC PANEL
ALT: 6 U/L (ref 0–44)
AST: 19 U/L (ref 15–41)
Albumin: 3.8 g/dL (ref 3.5–5.0)
Alkaline Phosphatase: 87 U/L (ref 38–126)
Anion gap: 8 (ref 5–15)
BUN: 24 mg/dL — ABNORMAL HIGH (ref 8–23)
CO2: 24 mmol/L (ref 22–32)
Calcium: 9.4 mg/dL (ref 8.9–10.3)
Chloride: 107 mmol/L (ref 98–111)
Creatinine, Ser: 0.98 mg/dL (ref 0.44–1.00)
GFR calc Af Amer: >60 mL/min (ref >60)
GFR calc non Af Amer: 56 mL/min — ABNORMAL LOW (ref >60)
Glucose, Bld: 90 mg/dL (ref 70–99)
Potassium: 4.2 mmol/L (ref 3.5–5.1)
Sodium: 139 mmol/L (ref 135–145)
Total Bilirubin: 0.3 mg/dL (ref 0.3–1.2)
Total Protein: 6.7 g/dL (ref 6.5–8.1)

## 2018-07-07 LAB — CBC WITH DIFFERENTIAL/PLATELET
Abs Immature Granulocytes: 0.02 10*3/uL (ref 0.00–0.07)
Basophils Absolute: 0 10*3/uL (ref 0.0–0.1)
Basophils Relative: 1 %
Eosinophils Absolute: 0.2 10*3/uL (ref 0.0–0.5)
Eosinophils Relative: 3 %
HCT: 40 % (ref 36.0–46.0)
Hemoglobin: 12.5 g/dL (ref 12.0–15.0)
Immature Granulocytes: 0 %
Lymphocytes Relative: 17 %
Lymphs Abs: 0.9 10*3/uL (ref 0.7–4.0)
MCH: 26.9 pg (ref 26.0–34.0)
MCHC: 31.3 g/dL (ref 30.0–36.0)
MCV: 86 fL (ref 80.0–100.0)
Monocytes Absolute: 0.4 10*3/uL (ref 0.1–1.0)
Monocytes Relative: 8 %
Neutro Abs: 3.5 10*3/uL (ref 1.7–7.7)
Neutrophils Relative %: 71 %
Platelets: 141 10*3/uL — ABNORMAL LOW (ref 150–400)
RBC: 4.65 MIL/uL (ref 3.87–5.11)
RDW: 15.7 % — ABNORMAL HIGH (ref 11.5–15.5)
WBC: 4.9 10*3/uL (ref 4.0–10.5)
nRBC: 0 % (ref 0.0–0.2)

## 2018-07-07 LAB — PHOSPHORUS: Phosphorus: 3.6 mg/dL (ref 2.5–4.6)

## 2018-07-07 LAB — MAGNESIUM: Magnesium: 1.8 mg/dL (ref 1.7–2.4)

## 2018-07-07 LAB — PROTIME-INR
INR: 1.2
Prothrombin Time: 15.1 seconds (ref 11.4–15.2)

## 2018-07-07 SURGERY — LAPAROSCOPIC CHOLECYSTECTOMY WITH INTRAOPERATIVE CHOLANGIOGRAM
Anesthesia: General

## 2018-07-07 MED ORDER — PROPOFOL 10 MG/ML IV BOLUS
INTRAVENOUS | Status: AC
  Filled 2018-07-07: qty 20

## 2018-07-07 MED ORDER — FENTANYL CITRATE (PF) 250 MCG/5ML IJ SOLN
INTRAMUSCULAR | Status: AC
  Filled 2018-07-07: qty 5

## 2018-07-07 MED ORDER — PROPOFOL 10 MG/ML IV BOLUS
INTRAVENOUS | Status: DC | PRN
  Administered 2018-07-07: 50 mg via INTRAVENOUS
  Administered 2018-07-07: 140 mg via INTRAVENOUS

## 2018-07-07 MED ORDER — DEXAMETHASONE SODIUM PHOSPHATE 10 MG/ML IJ SOLN
INTRAMUSCULAR | Status: AC
  Filled 2018-07-07: qty 1

## 2018-07-07 MED ORDER — ONDANSETRON HCL 4 MG/2ML IJ SOLN
INTRAMUSCULAR | Status: DC | PRN
  Administered 2018-07-07: 4 mg via INTRAVENOUS

## 2018-07-07 MED ORDER — HEPARIN (PORCINE) 25000 UT/250ML-% IV SOLN
800.0000 [IU]/h | INTRAVENOUS | Status: DC
  Filled 2018-07-07: qty 250

## 2018-07-07 MED ORDER — SUGAMMADEX SODIUM 200 MG/2ML IV SOLN
INTRAVENOUS | Status: DC | PRN
  Administered 2018-07-07 (×2): 100 mg via INTRAVENOUS

## 2018-07-07 MED ORDER — FENTANYL CITRATE (PF) 100 MCG/2ML IJ SOLN
INTRAMUSCULAR | Status: DC | PRN
  Administered 2018-07-07: 100 ug via INTRAVENOUS
  Administered 2018-07-07 (×2): 50 ug via INTRAVENOUS

## 2018-07-07 MED ORDER — PHENYLEPHRINE 40 MCG/ML (10ML) SYRINGE FOR IV PUSH (FOR BLOOD PRESSURE SUPPORT)
PREFILLED_SYRINGE | INTRAVENOUS | Status: DC | PRN
  Administered 2018-07-07: 80 ug via INTRAVENOUS

## 2018-07-07 MED ORDER — OXYCODONE HCL 5 MG PO TABS: 5.0000 mg | ORAL_TABLET | Freq: Once | ORAL | Status: DC | PRN

## 2018-07-07 MED ORDER — LIDOCAINE 2% (20 MG/ML) 5 ML SYRINGE
INTRAMUSCULAR | Status: AC
  Filled 2018-07-07: qty 5

## 2018-07-07 MED ORDER — EPHEDRINE SULFATE-NACL 50-0.9 MG/10ML-% IV SOSY
PREFILLED_SYRINGE | INTRAVENOUS | Status: DC | PRN
  Administered 2018-07-07 (×2): 10 mg via INTRAVENOUS
  Administered 2018-07-07: 5 mg via INTRAVENOUS

## 2018-07-07 MED ORDER — LIDOCAINE 2% (20 MG/ML) 5 ML SYRINGE
INTRAMUSCULAR | Status: DC | PRN
  Administered 2018-07-07: 100 mg via INTRAVENOUS

## 2018-07-07 MED ORDER — ACETAMINOPHEN 10 MG/ML IV SOLN: 1000.0000 mg | Freq: Once | INTRAVENOUS | Status: DC | PRN

## 2018-07-07 MED ORDER — ROCURONIUM BROMIDE 50 MG/5ML IV SOSY
PREFILLED_SYRINGE | INTRAVENOUS | Status: DC | PRN
  Administered 2018-07-07: 50 mg via INTRAVENOUS

## 2018-07-07 MED ORDER — OXYCODONE HCL 5 MG/5ML PO SOLN: 5.0000 mg | Freq: Once | ORAL | Status: DC | PRN

## 2018-07-07 MED ORDER — ROCURONIUM BROMIDE 50 MG/5ML IV SOSY
PREFILLED_SYRINGE | INTRAVENOUS | Status: AC
  Filled 2018-07-07: qty 5

## 2018-07-07 MED ORDER — ONDANSETRON HCL 4 MG/2ML IJ SOLN
INTRAMUSCULAR | Status: AC
  Filled 2018-07-07: qty 2

## 2018-07-07 MED ORDER — HEPARIN (PORCINE) 25000 UT/250ML-% IV SOLN
750.0000 [IU]/h | INTRAVENOUS | Status: DC
  Administered 2018-07-07: 800 [IU]/h via INTRAVENOUS
  Filled 2018-07-07 (×2): qty 250

## 2018-07-07 MED ORDER — DEXAMETHASONE SODIUM PHOSPHATE 10 MG/ML IJ SOLN
INTRAMUSCULAR | Status: DC | PRN
  Administered 2018-07-07: 10 mg via INTRAVENOUS

## 2018-07-07 MED ORDER — SODIUM CHLORIDE 0.9 % IR SOLN
Status: DC | PRN
  Administered 2018-07-07: 1

## 2018-07-07 MED ORDER — EPHEDRINE 5 MG/ML INJ
INTRAVENOUS | Status: AC
  Filled 2018-07-07: qty 10

## 2018-07-07 MED ORDER — LACTATED RINGERS IV SOLN
INTRAVENOUS | Status: DC
  Administered 2018-07-07 (×2): via INTRAVENOUS

## 2018-07-07 MED ORDER — PROMETHAZINE HCL 25 MG/ML IJ SOLN: 6.2500 mg | INTRAMUSCULAR | Status: DC | PRN

## 2018-07-07 MED ORDER — BUPIVACAINE-EPINEPHRINE 0.25% -1:200000 IJ SOLN
INTRAMUSCULAR | Status: DC | PRN
  Administered 2018-07-07: 10 mL

## 2018-07-07 MED ORDER — FENTANYL CITRATE (PF) 100 MCG/2ML IJ SOLN: 25.0000 ug | INTRAMUSCULAR | Status: DC | PRN

## 2018-07-07 MED ORDER — 0.9 % SODIUM CHLORIDE (POUR BTL) OPTIME
TOPICAL | Status: DC | PRN
  Administered 2018-07-07: 1000 mL

## 2018-07-07 SURGICAL SUPPLY — 47 items
ADH SKN CLS APL DERMABOND .7 (GAUZE/BANDAGES/DRESSINGS) ×1
APPLIER CLIP ROT 10 11.4 M/L (STAPLE) ×2
APR CLP MED LRG 11.4X10 (STAPLE) ×1
BAG SPEC RTRVL 10 TROC 200 (ENDOMECHANICALS) ×1
BLADE CLIPPER SURG (BLADE) IMPLANT
CANISTER SUCT 3000ML PPV (MISCELLANEOUS) ×2 IMPLANT
CHLORAPREP W/TINT 26ML (MISCELLANEOUS) ×2 IMPLANT
CLIP APPLIE ROT 10 11.4 M/L (STAPLE) ×1 IMPLANT
COVER MAYO STAND STRL (DRAPES) ×1 IMPLANT
COVER SURGICAL LIGHT HANDLE (MISCELLANEOUS) ×2 IMPLANT
COVER WAND RF STERILE (DRAPES) ×1 IMPLANT
DERMABOND ADVANCED (GAUZE/BANDAGES/DRESSINGS) ×1
DERMABOND ADVANCED .7 DNX12 (GAUZE/BANDAGES/DRESSINGS) ×1 IMPLANT
DRAPE C-ARM 42X72 X-RAY (DRAPES) ×1 IMPLANT
DRAPE WARM FLUID 44X44 (DRAPE) ×2 IMPLANT
ELECT REM PT RETURN 9FT ADLT (ELECTROSURGICAL) ×2
ELECTRODE REM PT RTRN 9FT ADLT (ELECTROSURGICAL) ×1 IMPLANT
GLOVE BIO SURGEON STRL SZ7.5 (GLOVE) ×1 IMPLANT
GLOVE BIO SURGEON STRL SZ8 (GLOVE) ×2 IMPLANT
GLOVE BIOGEL PI IND STRL 8 (GLOVE) ×1 IMPLANT
GLOVE BIOGEL PI INDICATOR 8 (GLOVE) ×1
GLOVE INDICATOR 7.5 STRL GRN (GLOVE) ×1 IMPLANT
GLOVE SURG SS PI 7.0 STRL IVOR (GLOVE) ×1 IMPLANT
GOWN STRL REUS W/ TWL LRG LVL3 (GOWN DISPOSABLE) ×2 IMPLANT
GOWN STRL REUS W/ TWL XL LVL3 (GOWN DISPOSABLE) ×1 IMPLANT
GOWN STRL REUS W/TWL LRG LVL3 (GOWN DISPOSABLE) ×4
GOWN STRL REUS W/TWL XL LVL3 (GOWN DISPOSABLE) ×2
KIT BASIN OR (CUSTOM PROCEDURE TRAY) ×2 IMPLANT
KIT TURNOVER KIT B (KITS) ×2 IMPLANT
NS IRRIG 1000ML POUR BTL (IV SOLUTION) ×2 IMPLANT
PAD ARMBOARD 7.5X6 YLW CONV (MISCELLANEOUS) ×2 IMPLANT
POUCH RETRIEVAL ECOSAC 10 (ENDOMECHANICALS) ×1 IMPLANT
POUCH RETRIEVAL ECOSAC 10MM (ENDOMECHANICALS) ×1
SCISSORS LAP 5X35 DISP (ENDOMECHANICALS) ×2 IMPLANT
SET CHOLANGIOGRAPH 5 50 .035 (SET/KITS/TRAYS/PACK) ×1 IMPLANT
SET IRRIG TUBING LAPAROSCOPIC (IRRIGATION / IRRIGATOR) ×2 IMPLANT
SET TUBE SMOKE EVAC HIGH FLOW (TUBING) ×2 IMPLANT
SLEEVE ENDOPATH XCEL 5M (ENDOMECHANICALS) ×2 IMPLANT
SPECIMEN JAR SMALL (MISCELLANEOUS) ×2 IMPLANT
SUT MNCRL AB 4-0 PS2 18 (SUTURE) ×2 IMPLANT
TOWEL OR 17X24 6PK STRL BLUE (TOWEL DISPOSABLE) ×1 IMPLANT
TOWEL OR 17X26 10 PK STRL BLUE (TOWEL DISPOSABLE) ×2 IMPLANT
TRAY LAPAROSCOPIC MC (CUSTOM PROCEDURE TRAY) ×2 IMPLANT
TROCAR XCEL BLUNT TIP 100MML (ENDOMECHANICALS) ×2 IMPLANT
TROCAR XCEL NON-BLD 11X100MML (ENDOMECHANICALS) ×2 IMPLANT
TROCAR XCEL NON-BLD 5MMX100MML (ENDOMECHANICALS) ×2 IMPLANT
WATER STERILE IRR 1000ML POUR (IV SOLUTION) ×2 IMPLANT

## 2018-07-07 NOTE — Progress Notes (Signed)
ANTICOAGULATION CONSULT NOTE  Pharmacy Consult: Heparin Indication: atrial fibrillation  Allergies  Allergen Reactions  . Lyrica [Pregabalin] Other (See Comments)    Felt loopy  . Zocor [Simvastatin] Other (See Comments)    Myalgias  . Relafen [Nabumetone] Rash    Patient Measurements: Height: 5\' 5"  (165.1 cm) Weight: 185 lb 3 oz (84 kg) IBW/kg (Calculated) : 57  Heparin dosing weight = 84 kg  Vital Signs: Temp: 97.5 F (36.4 C) (01/30 2045) Temp Source: Oral (01/30 2045) BP: 135/61 (01/30 2045) Pulse Rate: 58 (01/30 2045)  Labs: Recent Labs    07/05/18 0351 07/05/18 0955 07/06/18 0157 07/06/18 0627 07/06/18 1104 07/06/18 2212  HGB  --  13.6  --  11.7*  --   --   HCT  --  42.7  --  38.5  --   --   PLT  --  161  --  141*  --   --   LABPROT 15.8*  --   --  16.5*  --   --   INR 1.27  --   --  1.35  --   --   HEPARINUNFRC  --   --  0.95*  --  0.91* 0.55  CREATININE  --  0.92  --  0.98  --   --     Estimated Creatinine Clearance: 53.1 mL/min (by C-G formula based on SCr of 0.98 mg/dL).   Assessment: 64 YOF with history of Afib on Coumadin PTA.  Patient presented with worsening low back pain and abdominal pain.  Pharmacy now consulted to dose IV heparin while Coumadin is on hold for surgery (last Coumadin dose on 07/03/18).   Heparin level is supra-therapeutic.  Lab was obtained on the same arm as the heparin infusion.  Although it was drawn distal to the IV site, there is still a possibility of falsely elevated heparin levels.  No bleeding per patient.  1/31 AM update: heparin level therapeutic x 1 after rate decrease  Goal of Therapy:  Heparin level 0.3-0.7 units/mL Monitor platelets by anticoagulation protocol: Yes   Plan:  Cont heparin at 800 units/hr Confirmatory heparin with AM labs  Narda Bonds, PharmD, [place] Pharmacist Phone: [phone]

## 2018-07-07 NOTE — Anesthesia Procedure Notes (Signed)
Procedure Name: Intubation Date/Time: 07/07/2018 10:05 AM Performed by: Trinna Post., CRNA Pre-anesthesia Checklist: Patient identified, Emergency Drugs available, Suction available, Patient being monitored and Timeout performed Patient Re-evaluated:Patient Re-evaluated prior to induction Oxygen Delivery Method: Circle system utilized Preoxygenation: Pre-oxygenation with 100% oxygen Induction Type: IV induction Ventilation: Mask ventilation without difficulty Laryngoscope Size: Mac and 3 Grade View: Grade II Tube type: Oral Tube size: 7.0 mm Number of attempts: 2 (Attempt x1 by paramedic student with esophageal intubation, pt ventilated upon removal and successful intubation by MDA on second attempt) Airway Equipment and Method: Stylet Placement Confirmation: ETT inserted through vocal cords under direct vision,  positive ETCO2 and breath sounds checked- equal and bilateral Secured at: 22 cm Tube secured with: Tape Dental Injury: Teeth and Oropharynx as per pre-operative assessment  Comments: Virginia Rochester, paramedic student attempted first intubation

## 2018-07-07 NOTE — Progress Notes (Signed)
0825 NPO maint, CHG completed. Report was given to Sam in short stay. Pt to short stay/preop. Family present.

## 2018-07-07 NOTE — Anesthesia Postprocedure Evaluation (Signed)
Anesthesia Post Note  Patient: [name]  Procedure(s) Performed: LAPAROSCOPIC CHOLECYSTECTOMY (N/A )     Patient location during evaluation: PACU Anesthesia Type: General Level of consciousness: awake and alert Pain management: pain level controlled Vital Signs Assessment: post-procedure vital signs reviewed and stable Respiratory status: spontaneous breathing, nonlabored ventilation and respiratory function stable Cardiovascular status: blood pressure returned to baseline and stable Postop Assessment: no apparent nausea or vomiting Anesthetic complications: no    Last Vitals:  Vitals:   07/07/18 1140 07/07/18 1206  BP: (!) 156/58 (!) 170/69  Pulse: 64 65  Resp: 13 16  Temp: 36.5 C   SpO2: 100% 98%    Last Pain:  Vitals:   07/07/18 1200  TempSrc:   PainSc: 0-No pain                 Brennan Bailey

## 2018-07-07 NOTE — Interval H&P Note (Signed)
History and Physical Interval Note:  07/07/2018 9:26 AM  [name]  has presented today for surgery, with the diagnosis of BILIARY DYSKENSIA  The various methods of treatment have been discussed with the patient and family. After consideration of risks, benefits and other options for treatment, the patient has consented to  Procedure(s): LAPAROSCOPIC CHOLECYSTECTOMY WITH POSSIBLE INTRAOPERATIVE CHOLANGIOGRAM (N/A) as a surgical intervention .  The patient's history has been reviewed, patient examined, no change in status, stable for surgery.  I have reviewed the patient's chart and labs.  Questions were answered to the patient's satisfaction.     [facility]

## 2018-07-07 NOTE — Progress Notes (Signed)
PROGRESS NOTE    [name]  YDX:[id number] DOB: 06-04-1941 DOA: 07/01/2018 PCP: [name], MD   Brief Narrative:  [name] is a 78 y.o.femalewith medical history significant ofParkinson's disease, congestive heart failure with preserved ejection fraction, chronic back pain, degenerative joint disease, gastroesophageal reflux disease, hyperlipidemia, hypertension and paroxysmal atrial fibrillation on warfarin who presents for evaluation of worsening onset of progressively worsening low back pain and abdominal pain which has been present for at least 3 months.   Thoracolumbar MRI> several indicators of discitis-osteomyelitis at the T12-L1 level. The patient was started on Vanc and Cefepime and admitted. Further workup deemed that she likely did not have a discitis/Osteomyelitis and Abx were D/C'd. Patient was going to be D/C'd but continued to complain of Abdominal Pain so GI was consulted and a HIDA Scan was done which showed a low EF. General Surgery was consulted and plan on taking the patient for a cholecystectomy Friday. Cardiology was consulted for preoperative Clearance and she has been cleared for Surgery.   She underwent Surgical intervention today and had a Laparoscopic Cholecystectomy.   Assessment & Plan:   Principal Problem:   Discitis of thoracolumbar region Active Problems:   Osteoarthritis   Obstructive sleep apnea   Atrial fibrillation with RVR (HCC)   Chronic anticoagulation -warfarin therapy   Parkinsonism (HCC)   Preoperative cardiovascular examination  Chronic Back Pain Discitis of thoracolumbar region? - s/p multiple back surgeries by neuro surgery- pain appears to be chronic to me ? Discitison MRI- patient is not tender in the area of supposed discitis- Dr. Wynelle Cleveland discontinued the antibiotics andasked for a Neurosurgeryand ID opinion - the conclusion is that it is not likely she has a discitis and at this time does not need further  work up  -in regards to her chronic back-she states she was prescribed PT which exacerbated this(thus no PT ordered again) - she states the pain moves around and there is no radiation down her legs and no other neurological symptoms - there is moderate thoracic stenosis on the MRIwhich Dr. Wynelle Cleveland discussed with NS-NS does not feel it needs surgery or further inpatient management  It appears to be muscular pain on my exam and she was started on Voltaren Gel -C/w Oxycodone IR 10 mg q4hprn Severe Pain and 5 mg po q4hprn  Abdominal Bloating and Pain when eating Biliary Dyskinesia  -GI consulted- her pain did not seem consistent with gallbladder disease but a HIDA scan was ordered- it shows a low EF; Some symptoms may be attributable to the Gallbladder but certainly not all -GI discussed with the patient that her pain may persist even with the removal of the gallbladder -General Surgery consulted and undewent Cholecystectomy today 07/07/2018 -C/w Pantoprazole 40 mg po BID  -Cardiology Consulted for Pre-operative Clearance    Obstructive Sleep Apnea -Does not use CPAP  Paroxysmal A Fib  -Cont Flecainide 50 mg po BID  -Coumadin per pharmacy stopped and will changed to IV Heparin for Surgery today -Cardiology consulted for Pre-operative Clearance  -Resuming Coumadin post-operatively when safe by Surgery  Thrombocytopenia -Appears to be chronic -Platelet Count went from 130 -> 161 -> 141 -Continue to Monitor for S/Sx of Bleeding -Repeat CBC in AM   Parkinsonism -Continue Sinemet IR 25-100 2 tab po 4 times daily   Obesity -Estimated body mass index is 30.82 kg/m as calculated from the following:   Height as of this encounter: 5\' 5"  (1.651 m).   Weight as of this encounter:  84 kg. -Weight Loss Counseling given   HLD -C/w Pravastatin 40 mg po qHS  HTN -C/w Metoprolol Succinate 100 mg po Daily  -Continue to Monitor Blood Pressures carefully  Normocytic Anemia -Patient's  Hb/Hct stable and now 12.5/40.0 -Continue to Monitor for S/Sx of Bleeding as patient is on Anticoagulation -Repeat CBC in AM   DVT prophylaxis: Anticoagulated with Heparin gtt Code Status: FULL CODE  Family Communication: No family present Disposition Plan: Pending Surgical Clearance as she just underwent Cholecystectomy  Consultants:   Cardiology  Gastroenterology  General Surgery  Neurosurgery  Infectious Diseases   Procedures: HIDA Scan   Antimicrobials:  Anti-infectives (From admission, onward)   Start     Dose/Rate Route Frequency Ordered Stop   07/07/18 0600  ceFAZolin (ANCEF) IVPB 2g/100 mL premix     2 g 200 mL/hr over 30 Minutes Intravenous  Once 07/06/18 0920 07/07/18 0532   07/03/18 0400  vancomycin (VANCOCIN) 1,500 mg in sodium chloride 0.9 % 500 mL IVPB  Status:  Discontinued     1,500 mg 250 mL/hr over 120 Minutes Intravenous Every 36 hours 07/01/18 1540 07/02/18 0912   07/01/18 1545  ceFEPIme (MAXIPIME) 1 g in sodium chloride 0.9 % 100 mL IVPB  Status:  Discontinued     1 g 200 mL/hr over 30 Minutes Intravenous Every 12 hours 07/01/18 1540 07/02/18 0912   07/01/18 1545  vancomycin (VANCOCIN) 1,750 mg in sodium chloride 0.9 % 500 mL IVPB     1,750 mg 250 mL/hr over 120 Minutes Intravenous  Once 07/01/18 1540 07/02/18 1820     Subjective: Seen and examined after surgery and was drowsy. Complained of a 7/10 Abdominal Pain. No Nausea today and wanted to sleep.   Objective: Vitals:   07/07/18 1124 07/07/18 1139 07/07/18 1140 07/07/18 1206  BP: (!) 154/60 (!) 147/59 (!) 156/58 (!) 170/69  Pulse: 68 65 64 65  Resp: 11 15 13 16   Temp:   97.7 F (36.5 C)   TempSrc:      SpO2: 100% 99% 100% 98%  Weight:      Height:        Intake/Output Summary (Last 24 hours) at 07/07/2018 1346 Last data filed at 07/07/2018 1140 Gross per 24 hour  Intake 1340 ml  Output 20 ml  Net 1320 ml   Filed Weights   07/01/18 1800 07/01/18 2300  Weight: 84.8 kg 84 kg    Examination: Physical Exam:  Constitutional: Well-nourished, well-developed obese Caucasian female currently no acute distress appears calm and drowsy after her surgery Eyes: Lids and conjunctive are normal.  Sclera anicteric ENMT: External ears and nose appear normal.  Grossly normal hearing Neck: Supple with no JVD Respiratory: Diminished auscultation bilaterally no appreciable wheezing, rales, rhonchi.  Patient not tachypneic using accessory muscles to breathe Cardiovascular: Regular rate and rhythm.  No appreciable murmurs, rubs, gallops Abdomen: Soft, mildly tender to palpate.  Distended second body habitus.  Bowel sounds present.  Surgical incisions appear clean dry intact GU: Deferred Musculoskeletal: No contractures or cyanosis.  No joint deformity noted Skin: Skin is warm and dry with no appreciable rashes or lesions.  Surgical incisions appear clean dry and intact Neurologic: Cranial nerves II through XII grossly intact no appreciable focal deficits. Psychiatric: Patient is drowsy but easily arousable.  Normal mood and affect.  Normal judgment and insight  Data Reviewed: I have personally reviewed following labs and imaging studies  CBC: Recent Labs  Lab 07/01/18 0057 07/02/18 2202 07/05/18 0955 07/06/18 5427  07/07/18 0343  WBC 5.6 4.4 5.0 4.3 4.9  NEUTROABS  --   --  3.7 2.7 3.5  HGB 12.0 11.7* 13.6 11.7* 12.5  HCT 40.3 37.0 42.7 38.5 40.0  MCV 87.8 85.5 85.7 86.9 86.0  PLT 145* 130* 161 141* 585*   Basic Metabolic Panel: Recent Labs  Lab 07/01/18 0057 07/02/18 0438 07/05/18 0955 07/06/18 0627 07/07/18 0343  NA 138 139 140 143 139  K 4.2 3.9 4.2 4.0 4.2  CL 104 106 107 111 107  CO2 24 23 26 25 24   GLUCOSE 120* 97 97 84 90  BUN 22 16 23  24* 24*  CREATININE 1.27* 0.93 0.92 0.98 0.98  CALCIUM 9.2 8.7* 9.6 9.0 9.4  MG  --   --  2.0 1.9 1.8  PHOS  --   --  3.2 3.6 3.6   GFR: Estimated Creatinine Clearance: 53.1 mL/min (by C-G formula based on SCr of 0.98  mg/dL). Liver Function Tests: Recent Labs  Lab 07/01/18 0057 07/05/18 0955 07/06/18 0627 07/07/18 0343  AST 16 18 23 19   ALT 5 5 7 6   ALKPHOS 99 93 82 87  BILITOT 0.4 0.7 0.6 0.3  PROT 6.8 7.2 6.5 6.7  ALBUMIN 4.0 3.9 3.6 3.8   Recent Labs  Lab 07/01/18 0057  LIPASE 30   No results for input(s): AMMONIA in the last 168 hours. Coagulation Profile: Recent Labs  Lab 07/02/18 0438 07/03/18 0645 07/05/18 0351 07/06/18 0627 07/07/18 0343  INR 2.06 1.60 1.27 1.35 1.20   Cardiac Enzymes: No results for input(s): CKTOTAL, CKMB, CKMBINDEX, TROPONINI in the last 168 hours. BNP (last 3 results) No results for input(s): PROBNP in the last 8760 hours. HbA1C: No results for input(s): HGBA1C in the last 72 hours. CBG: No results for input(s): GLUCAP in the last 168 hours. Lipid Profile: No results for input(s): CHOL, HDL, LDLCALC, TRIG, CHOLHDL, LDLDIRECT in the last 72 hours. Thyroid Function Tests: No results for input(s): TSH, T4TOTAL, FREET4, T3FREE, THYROIDAB in the last 72 hours. Anemia Panel: No results for input(s): VITAMINB12, FOLATE, FERRITIN, TIBC, IRON, RETICCTPCT in the last 72 hours. Sepsis Labs: No results for input(s): PROCALCITON, LATICACIDVEN in the last 168 hours.  Recent Results (from the past 240 hour(s))  Blood culture (routine x 2)     Status: None   Collection Time: 07/01/18  8:15 AM  Result Value Ref Range Status   Specimen Description BLOOD LEFT WRIST  Final   Special Requests   Final    BOTTLES DRAWN AEROBIC AND ANAEROBIC Blood Culture results may not be optimal due to an inadequate volume of blood received in culture bottles   Culture   Final    NO GROWTH 5 DAYS Performed at [facility], [place] [address], [place], [place] [address]    Report Status 07/06/2018 FINAL  Final  Blood culture (routine x 2)     Status: None   Collection Time: 07/01/18  8:30 AM  Result Value Ref Range Status   Specimen Description BLOOD RIGHT ANTECUBITAL  Final    Special Requests   Final    BOTTLES DRAWN AEROBIC AND ANAEROBIC Blood Culture adequate volume   Culture   Final    NO GROWTH 5 DAYS Performed at [facility], [place] [address] [place], [place] [address]    Report Status 07/06/2018 FINAL  Final  Surgical pcr screen     Status: None   Collection Time: 07/06/18 12:46 PM  Result Value Ref Range  Status   MRSA, PCR NEGATIVE NEGATIVE Final   Staphylococcus aureus NEGATIVE NEGATIVE Final    Comment: (NOTE) The Xpert SA Assay (FDA approved for NASAL specimens in patients 28 years of age and older), is one component of a comprehensive surveillance program. It is not intended to diagnose infection nor to guide or monitor treatment. Performed at [facility], [place] [address] [place], [place] [address]     Radiology Studies: No results found. Scheduled Meds: . acetaminophen  650 mg Oral Q6H  . acidophilus  1 capsule Oral Daily  . carbidopa-levodopa  2 tablet Oral QID  . chlorzoxazone  500 mg Oral BID  . dicyclomine  20 mg Oral TID AC  . flecainide  50 mg Oral BID  . gabapentin  300 mg Oral QHS  . latanoprost  1 drop Both Eyes QHS  . lidocaine  1 patch Transdermal Q24H  . metoprolol succinate  100 mg Oral Daily  . pantoprazole  40 mg Oral BID  . polyethylene glycol  17 g Oral Daily  . potassium chloride SA  40 mEq Oral Daily  . pramipexole  0.5 mg Oral QHS  . pravastatin  40 mg Oral QHS  . senna  1 tablet Oral BID   Continuous Infusions: . lactated ringers 10 mL/hr at 07/07/18 0850    LOS: 6 days    Kerney Elbe, DO [facility] PAGER is on [place]  If 7PM-7AM, please contact night-coverage [email or web address] Password [email or web address] 07/07/2018, 1:46 PM

## 2018-07-07 NOTE — Transfer of Care (Signed)
Immediate Anesthesia Transfer of Care Note  Patient: [name]  Procedure(s) Performed: LAPAROSCOPIC CHOLECYSTECTOMY (N/A )  Patient Location: PACU  Anesthesia Type:General  Level of Consciousness: drowsy  Airway & Oxygen Therapy: Patient Spontanous Breathing and Patient connected to nasal cannula oxygen  Post-op Assessment: Report given to RN and Post -op Vital signs reviewed and stable  Post vital signs: Reviewed and stable  Last Vitals:  Vitals Value Taken Time  BP 148/57 07/07/2018 11:08 AM  Temp    Pulse 66 07/07/2018 11:09 AM  Resp 12 07/07/2018 11:09 AM  SpO2 98 % 07/07/2018 11:09 AM  Vitals shown include unvalidated device data.  Last Pain:  Vitals:   07/07/18 0413  TempSrc: Oral  PainSc:       Patients Stated Pain Goal: 3 (19/91/44 4584)  Complications: No apparent anesthesia complications

## 2018-07-07 NOTE — Plan of Care (Signed)
  Problem: Activity: Goal: Risk for activity intolerance will decrease Outcome: Progressing   Problem: Coping: Goal: Level of anxiety will decrease Outcome: Progressing   Problem: Pain Managment: Goal: General experience of comfort will improve Outcome: Progressing   Problem: Skin Integrity: Goal: Risk for impaired skin integrity will decrease Outcome: Progressing   

## 2018-07-07 NOTE — Op Note (Signed)
Laparoscopic Cholecystectomy  Procedure Note  Indications: This patient presents with symptomatic gallbladder disease and will undergo laparoscopic cholecystectomy. The procedure has been discussed with the patient. Operative and non operative treatments have been discussed. Risks of surgery include bleeding, infection,  Common bile duct injury,  Injury to the stomach,liver, colon,small intestine, abdominal wall,  Diaphragm,  Major blood vessels,  And the need for an open procedure.  Other risks include worsening of medical problems, death,  DVT and pulmonary embolism, and cardiovascular events.   Medical options have also been discussed. The patient has been informed of long term expectations of surgery and non surgical options,  The patient agrees to proceed.    Pre-operative Diagnosis: biliary dyskinesia   Post-operative Diagnosis: Same  Surgeon: Joyice Faster Natalye Kott   Assistants: Deon Pilling RNFA    Anesthesia: General endotracheal anesthesia and Local anesthesia 0.25.% bupivacaine, with epinephrine  ASA Class: 3  Procedure Details  The patient was seen again in the Holding Room. The risks, benefits, complications, treatment options, and expected outcomes were discussed with the patient. The possibilities of reaction to medication, pulmonary aspiration, perforation of viscus, bleeding, recurrent infection, finding a normal gallbladder, the need for additional procedures, failure to diagnose a condition, the possible need to convert to an open procedure, and creating a complication requiring transfusion or operation were discussed with the patient. The patient and/or family concurred with the proposed plan, giving informed consent. The site of surgery properly noted/marked. The patient was taken to Operating Room, identified as [name] and the procedure verified as Laparoscopic Cholecystectomy with Intraoperative Cholangiograms. A Time Out was held and the above information confirmed.  Prior  to the induction of general anesthesia, antibiotic prophylaxis was administered. General endotracheal anesthesia was then administered and tolerated well. After the induction, the abdomen was prepped in the usual sterile fashion. The patient was positioned in the supine position with the left arm comfortably tucked, along with some reverse Trendelenburg.  Local anesthetic agent was injected into the skin near the umbilicus and an incision made. The midline fascia was incised and the Hasson technique was used to introduce a 12 mm port under direct vision. It was secured with a figure of eight Vicryl suture placed in the usual fashion. Pneumoperitoneum was then created with CO2 and tolerated well without any adverse changes in the patient's vital signs. Additional trocars were introduced under direct vision with an 11 mm trocar in the epigastrium and 2 5 mm trocars in the right upper quadrant. All skin incisions were infiltrated with a local anesthetic agent before making the incision and placing the trocars.   The gallbladder was identified, the fundus grasped and retracted cephalad. Adhesions were lysed bluntly and with the electrocautery where indicated, taking care not to injure any adjacent organs or viscus. The infundibulum was grasped and retracted laterally, exposing the peritoneum overlying the triangle of Calot. This was then divided and exposed in a blunt fashion. The cystic duct was clearly identified and bluntly dissected circumferentially. The junctions of the gallbladder, cystic duct and common bile duct were clearly identified prior to the division of any linear structure.   The critical view was obtained and the cystic duct was very small.   IOC not performed due to the above findings   The cystic duct was then  ligated with surgical clips  on the patient side and  clipped on the gallbladder side and divided. The cystic artery was identified, dissected free, ligated with clips and divided as  well. Posterior cystic artery clipped and divided.  The gallbladder was dissected from the liver bed in retrograde fashion with the electrocautery. The gallbladder was removed. The liver bed was irrigated and inspected. Hemostasis was achieved with the electrocautery. Copious irrigation was utilized and was repeatedly aspirated until clear all particulate matter. Hemostasis was achieved with no signs of bleeding or bile leakage.  Pneumoperitoneum was completely reduced after viewing removal of the trocars under direct vision. The wound was thoroughly irrigated and the fascia was then closed with a figure of eight suture; the skin was then closed with 4 O monocryl  and a sterile dressing of Dermabond  was applied.  Instrument, sponge, and needle counts were correct at closure and at the conclusion of the case.   Findings: Mild chronic inflammation   Estimated Blood Loss: less than 50 mL         Drains: none         Total IV Fluids: per record           Specimens: Gallbladder           Complications: None; patient tolerated the procedure well.         Disposition: PACU - hemodynamically stable.         Condition: stable

## 2018-07-07 NOTE — Progress Notes (Signed)
ANTICOAGULATION CONSULT NOTE  Pharmacy Consult: Heparin Indication: atrial fibrillation  Allergies  Allergen Reactions  . Lyrica [Pregabalin] Other (See Comments)    Felt loopy  . Zocor [Simvastatin] Other (See Comments)    Myalgias  . Relafen [Nabumetone] Rash    Patient Measurements: Height: 5\' 5"  (165.1 cm) Weight: 185 lb 3 oz (84 kg) IBW/kg (Calculated) : 57  Heparin dosing weight = 84 kg  Vital Signs: Temp: 97.7 F (36.5 C) (01/31 1140) Temp Source: Oral (01/31 0413) BP: 170/69 (01/31 1206) Pulse Rate: 65 (01/31 1206)  Labs: Recent Labs    07/05/18 0351  07/05/18 0955 07/06/18 0157 07/06/18 0627 07/06/18 1104 07/06/18 2212 07/07/18 0343  HGB  --    < > 13.6  --  11.7*  --   --  12.5  HCT  --   --  42.7  --  38.5  --   --  40.0  PLT  --   --  161  --  141*  --   --  141*  LABPROT 15.8*  --   --   --  16.5*  --   --  15.1  INR 1.27  --   --   --  1.35  --   --  1.20  HEPARINUNFRC  --   --   --  0.95*  --  0.91* 0.55  --   CREATININE  --   --  0.92  --  0.98  --   --  0.98   < > = values in this interval not displayed.    Estimated Creatinine Clearance: 53.1 mL/min (by C-G formula based on SCr of 0.98 mg/dL).   Assessment: 89 YOF with history of Afib on Coumadin PTA.  Patient presented with worsening low back pain and abdominal pain. Pharmacy now consulted to dose IV heparin while Coumadin is on hold for surgery (last Coumadin dose on 07/03/18).   Patient had lap chole this AM. Heparin was held starting at 0200 this morning and will be resumed later this evening at 1700 with no bolus. Will resume heparin at last therapeutic rate.   Goal of Therapy:  Heparin level 0.3-0.7 units/mL Monitor platelets by anticoagulation protocol: Yes   Plan:  Start heparin at 800 units/hr at 1700 tonight Check 8 hr heparin level and CBC with AM labs  Jackson Latino, PharmD PGY1 Pharmacy Resident Phone [phone] 07/07/2018     2:43 PM

## 2018-07-08 DIAGNOSIS — R1310 Dysphagia, unspecified: Secondary | ICD-10-CM

## 2018-07-08 DIAGNOSIS — T17308A Unspecified foreign body in larynx causing other injury, initial encounter: Secondary | ICD-10-CM

## 2018-07-08 LAB — CBC WITH DIFFERENTIAL/PLATELET
Abs Immature Granulocytes: 0.06 10*3/uL (ref 0.00–0.07)
Basophils Absolute: 0 10*3/uL (ref 0.0–0.1)
Basophils Relative: 0 %
Eosinophils Absolute: 0 10*3/uL (ref 0.0–0.5)
Eosinophils Relative: 0 %
HCT: 40.7 % (ref 36.0–46.0)
Hemoglobin: 12.6 g/dL (ref 12.0–15.0)
Immature Granulocytes: 1 %
Lymphocytes Relative: 5 %
Lymphs Abs: 0.4 10*3/uL — ABNORMAL LOW (ref 0.7–4.0)
MCH: 26.4 pg (ref 26.0–34.0)
MCHC: 31 g/dL (ref 30.0–36.0)
MCV: 85.1 fL (ref 80.0–100.0)
Monocytes Absolute: 0.4 10*3/uL (ref 0.1–1.0)
Monocytes Relative: 4 %
Neutro Abs: 8 10*3/uL — ABNORMAL HIGH (ref 1.7–7.7)
Neutrophils Relative %: 90 %
Platelets: 162 10*3/uL (ref 150–400)
RBC: 4.78 MIL/uL (ref 3.87–5.11)
RDW: 15.3 % (ref 11.5–15.5)
WBC: 8.9 10*3/uL (ref 4.0–10.5)
nRBC: 0 % (ref 0.0–0.2)

## 2018-07-08 LAB — HEPARIN LEVEL (UNFRACTIONATED)
Heparin Unfractionated: 0.55 IU/mL (ref 0.30–0.70)
Heparin Unfractionated: 0.52 IU/mL (ref 0.30–0.70)

## 2018-07-08 LAB — COMPREHENSIVE METABOLIC PANEL
ALT: 7 U/L (ref 0–44)
AST: 28 U/L (ref 15–41)
Albumin: 4 g/dL (ref 3.5–5.0)
Alkaline Phosphatase: 89 U/L (ref 38–126)
Anion gap: 12 (ref 5–15)
BUN: 14 mg/dL (ref 8–23)
CO2: 24 mmol/L (ref 22–32)
Calcium: 9.9 mg/dL (ref 8.9–10.3)
Chloride: 107 mmol/L (ref 98–111)
Creatinine, Ser: 0.89 mg/dL (ref 0.44–1.00)
GFR calc Af Amer: >60 mL/min (ref >60)
GFR calc non Af Amer: >60 mL/min (ref >60)
Glucose, Bld: 137 mg/dL — ABNORMAL HIGH (ref 70–99)
Potassium: 3.9 mmol/L (ref 3.5–5.1)
Sodium: 143 mmol/L (ref 135–145)
Total Bilirubin: 0.6 mg/dL (ref 0.3–1.2)
Total Protein: 7 g/dL (ref 6.5–8.1)

## 2018-07-08 LAB — BASIC METABOLIC PANEL
Anion gap: 12 (ref 5–15)
BUN: 20 mg/dL (ref 8–23)
CO2: 22 mmol/L (ref 22–32)
Calcium: 9.5 mg/dL (ref 8.9–10.3)
Chloride: 109 mmol/L (ref 98–111)
Creatinine, Ser: 1.07 mg/dL — ABNORMAL HIGH (ref 0.44–1.00)
GFR calc Af Amer: 59 mL/min — ABNORMAL LOW (ref >60)
GFR calc non Af Amer: 51 mL/min — ABNORMAL LOW (ref >60)
Glucose, Bld: 101 mg/dL — ABNORMAL HIGH (ref 70–99)
Potassium: 4.1 mmol/L (ref 3.5–5.1)
Sodium: 143 mmol/L (ref 135–145)

## 2018-07-08 LAB — MAGNESIUM: Magnesium: 1.8 mg/dL (ref 1.7–2.4)

## 2018-07-08 LAB — PROTIME-INR
INR: 1.16
Prothrombin Time: 14.7 seconds (ref 11.4–15.2)

## 2018-07-08 LAB — PHOSPHORUS: Phosphorus: 3.6 mg/dL (ref 2.5–4.6)

## 2018-07-08 MED ORDER — WARFARIN SODIUM 4 MG PO TABS
4.0000 mg | ORAL_TABLET | Freq: Once | ORAL | Status: AC
  Administered 2018-07-08: 4 mg via ORAL
  Filled 2018-07-08: qty 1

## 2018-07-08 MED ORDER — WARFARIN - PHARMACIST DOSING INPATIENT
Freq: Every day | Status: DC
  Administered 2018-07-08: 18:00:00

## 2018-07-08 NOTE — Evaluation (Signed)
Clinical/Bedside Swallow Evaluation Patient Details  Name: [name] MRN: [id number] Date of Birth: [birth year]  Today's Date: 07/08/2018 Time: SLP Start Time (ACUTE ONLY): 1325 SLP Stop Time (ACUTE ONLY): 1348 SLP Time Calculation (min) (ACUTE ONLY): 23 min  Past Medical History:  Past Medical History:  Diagnosis Date  . Allergic rhinitis   . Arthritis    "all over"  . CHF (congestive heart failure) ([facility])   . Chronic back pain   . DJD (degenerative joint disease)   . GERD (gastroesophageal reflux disease)   . Hyperlipidemia   . Hypertension   . Lumbar stenosis   . OSA (obstructive sleep apnea)    "couldn't handle CPAP; use mouth guard some; not all the time" (01/06/2014)  . PAF (paroxysmal atrial fibrillation) ([place])   . Scoliosis    with radiculopathy L2-S1 with prior surgery  . Small bowel obstruction ([facility])    versus ileus after last bck surgery  . Spondylosis    Past Surgical History:  Past Surgical History:  Procedure Laterality Date  . BACK SURGERY    . CATARACT EXTRACTION W/ INTRAOCULAR LENS  IMPLANT, BILATERAL  2011  . COLONOSCOPY  12/09/2004  . LIPOMA EXCISION  1980's   "fatty tumors"  . [place] SURGERY  02/2009   "ruptured disc"  . NASAL SEPTUM SURGERY  80's  . POSTERIOR LUMBAR FUSION  06/2010; 10/2011   "placed screws, rods, spacers both times"  . REPAIR DURAL / CSF LEAK  02/2009  . TOTAL ABDOMINAL HYSTERECTOMY  02/1993   HPI:  78 y.o. female with medical history significant of Parkinson's disease, congestive heart failure with preserved ejection fraction, chronic back pain, degenerative joint disease, gastroesophageal reflux disease, hyperlipidemia, hypertension and paroxysmal atrial fibrillation on warfarin who presents for evaluation of worsening onset of progressively worsening low back pain and abdominal pain which has been present for at least 3 months on 07/01/18; BSE generated d/t pt "choking on food and c/o difficulty swallowing pills" after diet  resumed post-op.  Assessment / Plan / Recommendation Clinical Impression   Pt presents with a normal oropharyngeal swallow; timely swallow without overt s/s of aspiration present; pt belched during consumption and c/o intermittent regurgitation at home and consistent GERD medications are not part of her medication regimen (only prn); discussed utilizing esophageal precautions during meals/snacks and f/u with PCP re: GERD with possible referral needed d/t recent symptoms.  Recommend Regular diet with thin liquids initiated at this time; ST will not f/u further within acute setting; thank you for this consult. SLP Visit Diagnosis: Dysphagia, unspecified (R13.10)    Aspiration Risk  No limitations    Diet Recommendation   Regular/thin liquids  Medication Administration: Whole meds with liquid    Other  Recommendations Oral Care Recommendations: Oral care BID   Follow up Recommendations Other (comment)(esophageal precautions; f/u with PCP for GERD)      Frequency and Duration   Evaluation only         Prognosis Prognosis for Safe Diet Advancement: Good      Swallow Study   General Date of Onset: 07/01/18 HPI: 78 y.o. female with medical history significant of Parkinson's disease, congestive heart failure with preserved ejection fraction, chronic back pain, degenerative joint disease, gastroesophageal reflux disease, hyperlipidemia, hypertension and paroxysmal atrial fibrillation on warfarin who presents for evaluation of worsening onset of progressively worsening low back pain and abdominal pain which has been present for at least 3 months.  Type of Study: Bedside Swallow Evaluation Previous Swallow  Assessment: (n/a) Diet Prior to this Study: NPO Temperature Spikes Noted: No Respiratory Status: Room air History of Recent Intubation: Yes Length of Intubations (days): (surgery only) Date extubated: (07/07/18) Behavior/Cognition: Alert;Cooperative;Pleasant mood Oral Cavity Assessment:  Within Functional Limits Oral Care Completed by SLP: No Oral Cavity - Dentition: Adequate natural dentition Vision: Functional for self-feeding Self-Feeding Abilities: Able to feed self Patient Positioning: Upright in bed Baseline Vocal Quality: Normal Volitional Cough: Strong Volitional Swallow: Able to elicit    Oral/Motor/Sensory Function Overall Oral Motor/Sensory Function: Within functional limits   Ice Chips Ice chips: Not tested   Thin Liquid Thin Liquid: Within functional limits Presentation: Cup;Straw    Nectar Thick Nectar Thick Liquid: Not tested   Honey Thick Honey Thick Liquid: Not tested   Puree Puree: Within functional limits Presentation: Self Fed   Solid     Solid: Within functional limits Presentation: Self Fed      Elvina Sidle, M.S., CCC-SLP 07/08/2018,5:05 PM

## 2018-07-08 NOTE — Progress Notes (Signed)
PROGRESS NOTE    [name]  WIO:[id number] DOB: 1940/09/20 DOA: 07/01/2018 PCP: [name], MD   Brief Narrative:  [name] is a 78 y.o.femalewith medical history significant ofParkinson's disease, congestive heart failure with preserved ejection fraction, chronic back pain, degenerative joint disease, gastroesophageal reflux disease, hyperlipidemia, hypertension and paroxysmal atrial fibrillation on warfarin who presents for evaluation of worsening onset of progressively worsening low back pain and abdominal pain which has been present for at least 3 months.   Thoracolumbar MRI> several indicators of discitis-osteomyelitis at the T12-L1 level. The patient was started on Vanc and Cefepime and admitted. Further workup deemed that she likely did not have a discitis/Osteomyelitis and Abx were D/C'd. Patient was going to be D/C'd but continued to complain of Abdominal Pain so GI was consulted and a HIDA Scan was done which showed a low EF. General Surgery was consulted and plan on taking the patient for a cholecystectomy Friday. Cardiology was consulted for preoperative Clearance and she has been cleared for Surgery.   She underwent Surgical intervention yesterday and had a Laparoscopic Cholecystectomy. She was doing well and Surgery recommended resuming Coumadin and advanced diet. Unfortunately she started choking when she started eating so she was made NPO and will get SLP involved. Coumadin resumed today.    Assessment & Plan:   Principal Problem:   Discitis of thoracolumbar region Active Problems:   Osteoarthritis   Obstructive sleep apnea   Atrial fibrillation with RVR (HCC)   Chronic anticoagulation -warfarin therapy   Parkinsonism (HCC)   Preoperative cardiovascular examination  Chronic Back Pain Discitis of thoracolumbar region? - s/p multiple back surgeries by neuro surgery- pain appears to be chronic to me ? Discitison MRI- patient is not tender in  the area of supposed discitis- Dr. Wynelle Cleveland discontinued the antibiotics andasked for a Neurosurgeryand ID opinion - the conclusion is that it is not likely she has a discitis and at this time does not need further work up  -in regards to her chronic back-she states she was prescribed PT which exacerbated this(thus no PT ordered again) - she states the pain moves around and there is no radiation down her legs and no other neurological symptoms - there is moderate thoracic stenosis on the MRIwhich Dr. Wynelle Cleveland discussed with NS-NS does not feel it needs surgery or further inpatient management  It appears to be muscular pain on my exam and she was started on Voltaren Gel -C/w Oxycodone IR 10 mg q4hprn Severe Pain and 5 mg po q4hprn  Abdominal Bloating and Pain when eating Biliary Dyskinesia s/o Laparscopic Cholecystectomy POD1 -GI consulted- her pain did not seem consistent with gallbladder disease but a HIDA scan was ordered- it shows a low EF; Some symptoms may be attributable to the Gallbladder but certainly not all -GI discussed with the patient that her pain may persist even with the removal of the gallbladder -General Surgery consulted and undewent Cholecystectomy today 07/07/2018 and was doing well -Diet was advanced and patient stated that her abdominal pain had improved and just felt "sore" from the surgery -Unfortunately she started choking on Diet she was put on so she was made NPO and SLP to evaluate  -C/w Pantoprazole 40 mg po BID  -Cardiology Consulted for Pre-operative Clearance    Obstructive Sleep Apnea -Does not use CPAP  Paroxysmal A Fib  -Cont Flecainide 50 mg po BID  -Coumadin per pharmacy was stopped and changed to IV Heparin for Surgery but per Surgery it is ok  to resume Coumadin so will do this today and continue Heparin bridge  -Cardiology consulted for Pre-operative Clearance  -Resuming Coumadin post-operatively today at Surgery recommendations -PT was 14.7 and  INR was 1.16 -Will likely send out on Lovenox injections in the AM   Thrombocytopenia -Appears to be chronic and fluctuating  -Platelet Count went from 130 -> 161 -> 141 -> 162 -Continue to Monitor for S/Sx of Bleeding now that patient is on Heparin gtt with resumption of Coumadin -Repeat CBC in AM   Parkinsonism -Continue Sinemet IR 25-100 2 tab po 4 times daily   Obesity -Estimated body mass index is 30.82 kg/m as calculated from the following:   Height as of this encounter: 5\' 5"  (1.651 m).   Weight as of this encounter: 84 kg. -Weight Loss Counseling given   HLD -C/w Pravastatin 40 mg po qHS  HTN -C/w Metoprolol Succinate 100 mg po Daily  -Continue to Monitor Blood Pressures carefully  Normocytic Anemia -Patient's Hb/Hct stable and now 12.6/40.7 -Continue to Monitor for S/Sx of Bleeding as patient is on Anticoagulation -Repeat CBC in AM   Dysphagia/Choking -Choked on the diet today after it was advanced by Surgery and had difficulty swallowing -Made NPO and SLP to Evaluate -Follow Diet Recommendations per SLP evaluation  DVT prophylaxis: Anticoagulated with Heparin gtt Code Status: FULL CODE  Family Communication: No family present Disposition Plan: Pending Surgical Clearance as she just underwent Cholecystectomy  Consultants:   Cardiology  Gastroenterology  General Surgery  Neurosurgery  Infectious Diseases   Procedures: HIDA Scan   Antimicrobials:  Anti-infectives (From admission, onward)   Start     Dose/Rate Route Frequency Ordered Stop   07/07/18 0600  ceFAZolin (ANCEF) IVPB 2g/100 mL premix     2 g 200 mL/hr over 30 Minutes Intravenous  Once 07/06/18 0920 07/07/18 0532   07/03/18 0400  vancomycin (VANCOCIN) 1,500 mg in sodium chloride 0.9 % 500 mL IVPB  Status:  Discontinued     1,500 mg 250 mL/hr over 120 Minutes Intravenous Every 36 hours 07/01/18 1540 07/02/18 0912   07/01/18 1545  ceFEPIme (MAXIPIME) 1 g in sodium chloride 0.9 % 100 mL  IVPB  Status:  Discontinued     1 g 200 mL/hr over 30 Minutes Intravenous Every 12 hours 07/01/18 1540 07/02/18 0912   07/01/18 1545  vancomycin (VANCOCIN) 1,750 mg in sodium chloride 0.9 % 500 mL IVPB     1,750 mg 250 mL/hr over 120 Minutes Intravenous  Once 07/01/18 1540 07/02/18 1820     Subjective: Seen and examined today and stated that her abdominal pain that she previously had was improved and that her abodmen was just sore. No nausea or vomiting. No CP or SOB.  Nursing reported that she was a little confused yesterday but improved today after she was reordered.  Started choking on food after diet was advanced so was made NPO and SLP to evaluate. Agreeable to go on Lovenox Injections to bridge Coumadin at D/C.   Objective: Vitals:   07/08/18 0004 07/08/18 0513 07/08/18 0946 07/08/18 1226  BP: (!) 176/62 (!) 181/79 (!) 126/47 (!) 125/45  Pulse: 67 74 62 66  Resp: 18 16    Temp: 97.9 F (36.6 C) 98 F (36.7 C) 98.1 F (36.7 C) (!) 97.5 F (36.4 C)  TempSrc: Oral Oral Oral Oral  SpO2: 94% 95% 97% 98%  Weight:      Height:        Intake/Output Summary (Last 24 hours) at  07/08/2018 1240 Last data filed at 07/08/2018 0800 Gross per 24 hour  Intake 600 ml  Output 350 ml  Net 250 ml   Filed Weights   07/01/18 1800 07/01/18 2300  Weight: 84.8 kg 84 kg   Examination: Physical Exam:  Constitutional: Well-nourished, well-developed obese Caucasian female currently no acute distress appears calm and comfortable sitting the chair bedside about to eat Eyes: Conjunctive are normal.  Sclera anicteric ENMT: External ears and nose appear normal.  Grossly normal hearing Neck: Appears supple with no JVD Respiratory: Slightly diminished auscultation bilaterally no appreciable wheezing, rales, cough.  Patient not tachypneic or using accessory muscles to breathe Cardiovascular: Regular rate and rhythm.  No appreciable murmurs, rubs, gallops. Abdomen: Soft, mildly tender to palpate.   Distended secondary body habitus.  Bowel sounds present GU: Deferred Musculoskeletal: No contractures or cyanosis.  No joint forms noted Skin: Skin is warm dry no appreciable rashes or lesions limited skin evaluation. Neurologic: Cranial nerves II through XII grossly intact no appreciable focal deficits Psychiatric: She is awake alert and oriented x3.  Mood and affect  Data Reviewed: I have personally reviewed following labs and imaging studies  CBC: Recent Labs  Lab 07/02/18 0438 07/05/18 0955 07/06/18 0627 07/07/18 0343 07/08/18 0556  WBC 4.4 5.0 4.3 4.9 8.9  NEUTROABS  --  3.7 2.7 3.5 8.0*  HGB 11.7* 13.6 11.7* 12.5 12.6  HCT 37.0 42.7 38.5 40.0 40.7  MCV 85.5 85.7 86.9 86.0 85.1  PLT 130* 161 141* 141* 009   Basic Metabolic Panel: Recent Labs  Lab 07/02/18 0438 07/05/18 0955 07/06/18 0627 07/07/18 0343 07/08/18 0556  NA 139 140 143 139 143  K 3.9 4.2 4.0 4.2 3.9  CL 106 107 111 107 107  CO2 23 26 25 24 24   GLUCOSE 97 97 84 90 137*  BUN 16 23 24* 24* 14  CREATININE 0.93 0.92 0.98 0.98 0.89  CALCIUM 8.7* 9.6 9.0 9.4 9.9  MG  --  2.0 1.9 1.8 1.8  PHOS  --  3.2 3.6 3.6 3.6   GFR: Estimated Creatinine Clearance: 58.5 mL/min (by C-G formula based on SCr of 0.89 mg/dL). Liver Function Tests: Recent Labs  Lab 07/05/18 0955 07/06/18 0627 07/07/18 0343 07/08/18 0556  AST 18 23 19 28   ALT 5 7 6 7   ALKPHOS 93 82 87 89  BILITOT 0.7 0.6 0.3 0.6  PROT 7.2 6.5 6.7 7.0  ALBUMIN 3.9 3.6 3.8 4.0   No results for input(s): LIPASE, AMYLASE in the last 168 hours. No results for input(s): AMMONIA in the last 168 hours. Coagulation Profile: Recent Labs  Lab 07/03/18 0645 07/05/18 0351 07/06/18 0627 07/07/18 0343 07/08/18 0556  INR 1.60 1.27 1.35 1.20 1.16   Cardiac Enzymes: No results for input(s): CKTOTAL, CKMB, CKMBINDEX, TROPONINI in the last 168 hours. BNP (last 3 results) No results for input(s): PROBNP in the last 8760 hours. HbA1C: No results for  input(s): HGBA1C in the last 72 hours. CBG: No results for input(s): GLUCAP in the last 168 hours. Lipid Profile: No results for input(s): CHOL, HDL, LDLCALC, TRIG, CHOLHDL, LDLDIRECT in the last 72 hours. Thyroid Function Tests: No results for input(s): TSH, T4TOTAL, FREET4, T3FREE, THYROIDAB in the last 72 hours. Anemia Panel: No results for input(s): VITAMINB12, FOLATE, FERRITIN, TIBC, IRON, RETICCTPCT in the last 72 hours. Sepsis Labs: No results for input(s): PROCALCITON, LATICACIDVEN in the last 168 hours.  Recent Results (from the past 240 hour(s))  Blood culture (routine x 2)  Status: None   Collection Time: 07/01/18  8:15 AM  Result Value Ref Range Status   Specimen Description BLOOD LEFT WRIST  Final   Special Requests   Final    BOTTLES DRAWN AEROBIC AND ANAEROBIC Blood Culture results may not be optimal due to an inadequate volume of blood received in culture bottles   Culture   Final    NO GROWTH 5 DAYS Performed at [facility], [place] [address], [place], [place] [address]    Report Status 07/06/2018 FINAL  Final  Blood culture (routine x 2)     Status: None   Collection Time: 07/01/18  8:30 AM  Result Value Ref Range Status   Specimen Description BLOOD RIGHT ANTECUBITAL  Final   Special Requests   Final    BOTTLES DRAWN AEROBIC AND ANAEROBIC Blood Culture adequate volume   Culture   Final    NO GROWTH 5 DAYS Performed at [facility], [place] [address], [place], [place] [address]    Report Status 07/06/2018 FINAL  Final  Surgical pcr screen     Status: None   Collection Time: 07/06/18 12:46 PM  Result Value Ref Range Status   MRSA, PCR NEGATIVE NEGATIVE Final   Staphylococcus aureus NEGATIVE NEGATIVE Final    Comment: (NOTE) The Xpert SA Assay (FDA approved for NASAL specimens in patients 76 years of age and older), is one component of a comprehensive surveillance program. It is not intended to diagnose infection nor to guide or monitor  treatment. Performed at [facility], [place] [address] [place], [place] [address]     Radiology Studies: No results found. Scheduled Meds: . acetaminophen  650 mg Oral Q6H  . acidophilus  1 capsule Oral Daily  . carbidopa-levodopa  2 tablet Oral QID  . chlorzoxazone  500 mg Oral BID  . dicyclomine  20 mg Oral TID AC  . flecainide  50 mg Oral BID  . gabapentin  300 mg Oral QHS  . latanoprost  1 drop Both Eyes QHS  . lidocaine  1 patch Transdermal Q24H  . metoprolol succinate  100 mg Oral Daily  . pantoprazole  40 mg Oral BID  . polyethylene glycol  17 g Oral Daily  . potassium chloride SA  40 mEq Oral Daily  . pramipexole  0.5 mg Oral QHS  . pravastatin  40 mg Oral QHS  . senna  1 tablet Oral BID   Continuous Infusions: . heparin 800 Units/hr (07/07/18 1734)  . lactated ringers 10 mL/hr at 07/07/18 0850    LOS: 7 days    Kerney Elbe, DO [facility] PAGER is on [place]  If 7PM-7AM, please contact night-coverage [email or web address] Password [email or web address] 07/08/2018, 12:40 PM

## 2018-07-08 NOTE — Plan of Care (Signed)
  Problem: Education: Goal: Knowledge of General Education information will improve Description: Including pain rating scale, medication(s)/side effects and non-pharmacologic comfort measures Outcome: Progressing   Problem: Clinical Measurements: Goal: Ability to maintain clinical measurements within normal limits will improve Outcome: Progressing   Problem: Elimination: Goal: Will not experience complications related to bowel motility Outcome: Progressing   Problem: Pain Managment: Goal: General experience of comfort will improve Outcome: Progressing   Problem: Skin Integrity: Goal: Risk for impaired skin integrity will decrease Outcome: Progressing   

## 2018-07-08 NOTE — Progress Notes (Signed)
[facility]/Trauma Progress Note  1 Day Post-Op   Assessment/Plan Abdominal Pain Biliary dyskinesia  - HIDA showed gallbladder ejection fraction of 24% - S/P lap chole, Dr. Brantley Stage, 01/31  FEN - heart healthy  VTE -heparin, okay to resume coumadin  ID -Vanc & Maxipime 1/25 - 1/26; Ancef 01/30-01/31 Follow up: [facility] 2 weeks  Plan: Pt is okay for discharge from surgical standpoint as long as she tolerates her diet. Follow up in [facility]. Please page [place] with any needs or questions.    LOS: 7 days    Subjective: CC: abdominal soreness  No issues overnight per pt. Nurse states pt was very confused overnight. Pt is alert and oriented this am. She denies nausea, vomiting, fever or chills. She has not had breakfast yet. Her pain is minimal. She is complaining more of back pain than abdominal pain.   Objective: Vital signs in last 24 hours: Temp:  [97.7 F (36.5 C)-98 F (36.7 C)] 98 F (36.7 C) (02/01 0513) Pulse Rate:  [64-74] 74 (02/01 0513) Resp:  [10-18] 16 (02/01 0513) BP: (142-181)/(57-79) 181/79 (02/01 0513) SpO2:  [94 %-100 %] 95 % (02/01 0513) Last BM Date: 07/07/18  Intake/Output from previous day: 01/31 0701 - 02/01 0700 In: 1580 [P.O.:480; I.V.:1100] Out: 370 [Urine:350; Blood:20] Intake/Output this shift: No intake/output data recorded.  PE: Gen:  Alert, NAD, pleasant, cooperative Pulm:  Rate and effort normal Abd: Soft, ND, +BS, incisions with glue intact are well appearing with mild ecchymosis around umbilical incision and they are without signs of infection. Mild RUQ tenderness, no peritonitis.  Skin: warm and dry   Anti-infectives: Anti-infectives (From admission, onward)   Start     Dose/Rate Route Frequency Ordered Stop   07/07/18 0600  ceFAZolin (ANCEF) IVPB 2g/100 mL premix     2 g 200 mL/hr over 30 Minutes Intravenous  Once 07/06/18 0920 07/07/18 0532   07/03/18 0400  vancomycin (VANCOCIN) 1,500 mg in sodium chloride 0.9 % 500 mL IVPB   Status:  Discontinued     1,500 mg 250 mL/hr over 120 Minutes Intravenous Every 36 hours 07/01/18 1540 07/02/18 0912   07/01/18 1545  ceFEPIme (MAXIPIME) 1 g in sodium chloride 0.9 % 100 mL IVPB  Status:  Discontinued     1 g 200 mL/hr over 30 Minutes Intravenous Every 12 hours 07/01/18 1540 07/02/18 0912   07/01/18 1545  vancomycin (VANCOCIN) 1,750 mg in sodium chloride 0.9 % 500 mL IVPB     1,750 mg 250 mL/hr over 120 Minutes Intravenous  Once 07/01/18 1540 07/02/18 1820      Lab Results:  Recent Labs    07/07/18 0343 07/08/18 0556  WBC 4.9 8.9  HGB 12.5 12.6  HCT 40.0 40.7  PLT 141* 162   BMET Recent Labs    07/07/18 0343 07/08/18 0556  NA 139 143  K 4.2 3.9  CL 107 107  CO2 24 24  GLUCOSE 90 137*  BUN 24* 14  CREATININE 0.98 0.89  CALCIUM 9.4 9.9   PT/INR Recent Labs    07/07/18 0343 07/08/18 0556  LABPROT 15.1 14.7  INR 1.20 1.16   CMP     Component Value Date/Time   NA 143 07/08/2018 0556   K 3.9 07/08/2018 0556   CL 107 07/08/2018 0556   CO2 24 07/08/2018 0556   GLUCOSE 137 (H) 07/08/2018 0556   BUN 14 07/08/2018 0556   CREATININE 0.89 07/08/2018 0556   CALCIUM 9.9 07/08/2018 0556   PROT 7.0  07/08/2018 0556   ALBUMIN 4.0 07/08/2018 0556   AST 28 07/08/2018 0556   ALT 7 07/08/2018 0556   ALKPHOS 89 07/08/2018 0556   BILITOT 0.6 07/08/2018 0556   GFRNONAA >60 07/08/2018 0556   GFRAA >60 07/08/2018 0556   Lipase     Component Value Date/Time   LIPASE 30 07/01/2018 0057    Studies/Results: No results found.    Kalman Drape , [facility] 07/08/2018, 9:14 AM  Pager: [phone] Mon-Wed, Friday 7:00am-4:30pm Thurs 7am-11:30am  Consults: [phone]

## 2018-07-08 NOTE — Progress Notes (Addendum)
ANTICOAGULATION CONSULT NOTE  Pharmacy Consult: Heparin and restart Warfarin Indication: atrial fibrillation  Allergies  Allergen Reactions  . Lyrica [Pregabalin] Other (See Comments)    Felt loopy  . Zocor [Simvastatin] Other (See Comments)    Myalgias  . Relafen [Nabumetone] Rash    Patient Measurements: Height: 5\' 5"  (165.1 cm) Weight: 185 lb 3 oz (84 kg) IBW/kg (Calculated) : 57  Heparin dosing weight = 84 kg  Vital Signs: Temp: 98 F (36.7 C) (02/01 0513) Temp Source: Oral (02/01 0513) BP: 181/79 (02/01 0513) Pulse Rate: 74 (02/01 0513)  Labs: Recent Labs    07/05/18 0955  07/06/18 0627 07/06/18 1104 07/06/18 2212 07/07/18 0343 07/08/18 0556  HGB 13.6  --  11.7*  --   --  12.5 12.6  HCT 42.7  --  38.5  --   --  40.0 40.7  PLT 161  --  141*  --   --  141* 162  LABPROT  --   --  16.5*  --   --  15.1 14.7  INR  --   --  1.35  --   --  1.20 1.16  HEPARINUNFRC  --    < >  --  0.91* 0.55  --  0.55  CREATININE 0.92  --  0.98  --   --  0.98  --    < > = values in this interval not displayed.    Estimated Creatinine Clearance: 53.1 mL/min (by C-G formula based on SCr of 0.98 mg/dL).   Assessment: 8 YOF with history of Afib on Coumadin PTA.  Patient presented with worsening low back pain and abdominal pain.  Pharmacy initially consulted to dose IV heparin while Coumadin is on hold for surgery (last Coumadin dose on 07/03/18).   POD #1 s/p lap cholecystectomy.  Heparin level = 0.52, level is therapeutic x 2 on heparin drip 800 units/hr.  Pharmacy consulted today to restart warfarin for h/o Afib.  INR is 1.16 .  Warfarin dose last given on 1/27 PM.  No bleeding reported.  CBC is within normal limits.   PTA warfarin dose:  *dose per pt: 2.5 mg daily except none on Tues/Thurs *dose per clinic: 2.5mg  daily except 1.25mg  on Tues/Thurs    Goal of Therapy:  Heparin level 0.3-0.7 units/mL INR 2-3 Monitor platelets by anticoagulation protocol: Yes   Plan:   Continue heparin at 800 units/hr Give Warfarin 4mg  po x1  Daily heparin level, INR, CBC.    Nicole Cella, RPh Clinical Pharmacist Please check AMION for all [place] phone numbers After 10:00 PM, call [place] [phone] 07/08/2018    2:47 PM

## 2018-07-09 DIAGNOSIS — R443 Hallucinations, unspecified: Secondary | ICD-10-CM

## 2018-07-09 DIAGNOSIS — K828 Other specified diseases of gallbladder: Principal | ICD-10-CM

## 2018-07-09 LAB — CBC WITH DIFFERENTIAL/PLATELET
Abs Immature Granulocytes: 0.06 10*3/uL (ref 0.00–0.07)
Basophils Absolute: 0 10*3/uL (ref 0.0–0.1)
Basophils Relative: 0 %
Eosinophils Absolute: 0 10*3/uL (ref 0.0–0.5)
Eosinophils Relative: 0 %
HCT: 41 % (ref 36.0–46.0)
Hemoglobin: 12.6 g/dL (ref 12.0–15.0)
Immature Granulocytes: 1 %
Lymphocytes Relative: 12 %
Lymphs Abs: 1 10*3/uL (ref 0.7–4.0)
MCH: 26.5 pg (ref 26.0–34.0)
MCHC: 30.7 g/dL (ref 30.0–36.0)
MCV: 86.1 fL (ref 80.0–100.0)
Monocytes Absolute: 0.5 10*3/uL (ref 0.1–1.0)
Monocytes Relative: 6 %
Neutro Abs: 6.7 10*3/uL (ref 1.7–7.7)
Neutrophils Relative %: 81 %
Platelets: 171 10*3/uL (ref 150–400)
RBC: 4.76 MIL/uL (ref 3.87–5.11)
RDW: 15.7 % — ABNORMAL HIGH (ref 11.5–15.5)
WBC: 8.3 10*3/uL (ref 4.0–10.5)
nRBC: 0 % (ref 0.0–0.2)

## 2018-07-09 LAB — PROTIME-INR
INR: 1.25
Prothrombin Time: 15.6 seconds — ABNORMAL HIGH (ref 11.4–15.2)

## 2018-07-09 LAB — MAGNESIUM: Magnesium: 1.9 mg/dL (ref 1.7–2.4)

## 2018-07-09 LAB — HEPARIN LEVEL (UNFRACTIONATED)
Heparin Unfractionated: 0.72 IU/mL — ABNORMAL HIGH (ref 0.30–0.70)
Heparin Unfractionated: 0.58 IU/mL (ref 0.30–0.70)

## 2018-07-09 LAB — PHOSPHORUS: Phosphorus: 3 mg/dL (ref 2.5–4.6)

## 2018-07-09 MED ORDER — ENOXAPARIN SODIUM 80 MG/0.8ML ~~LOC~~ SOLN: 1.0000 mg/kg | Freq: Two times a day (BID) | SUBCUTANEOUS | 0 refills | Status: DC

## 2018-07-09 MED ORDER — CARBIDOPA-LEVODOPA 25-100 MG PO TABS: 2.0000 | ORAL_TABLET | Freq: Three times a day (TID) | ORAL | 0 refills | Status: DC

## 2018-07-09 MED ORDER — POLYETHYLENE GLYCOL 3350 17 G PO PACK: 17.0000 g | PACK | Freq: Every day | ORAL | 0 refills | Status: DC

## 2018-07-09 MED ORDER — WARFARIN SODIUM 2.5 MG PO TABS
2.5000 mg | ORAL_TABLET | Freq: Once | ORAL | Status: DC
  Filled 2018-07-09: qty 1

## 2018-07-09 MED ORDER — ENOXAPARIN (LOVENOX) PATIENT EDUCATION KIT
PACK | Freq: Once | Status: AC
  Administered 2018-07-09: 19:00:00
  Filled 2018-07-09: qty 1

## 2018-07-09 MED ORDER — CARBIDOPA-LEVODOPA 25-100 MG PO TABS: 2.0000 | ORAL_TABLET | Freq: Three times a day (TID) | ORAL | Status: DC

## 2018-07-09 MED ORDER — ENOXAPARIN SODIUM 80 MG/0.8ML ~~LOC~~ SOLN
80.0000 mg | Freq: Two times a day (BID) | SUBCUTANEOUS | Status: DC
  Administered 2018-07-09: 80 mg via SUBCUTANEOUS
  Filled 2018-07-09 (×2): qty 0.8

## 2018-07-09 MED ORDER — LORAZEPAM 2 MG/ML IJ SOLN: 1.0000 mg | Freq: Once | INTRAMUSCULAR | Status: DC

## 2018-07-09 NOTE — Evaluation (Signed)
Physical Therapy Evaluation Patient Details Name: [name] MRN: [id number] DOB: 21-Jan-1941 Today's Date: 07/09/2018   History of Present Illness  Pt is a 78 y.o. F with significant PMH of Parkinson's disease, congestive heart failure, chronic back pain, hypertension and paroxysmal atrial fibrillation who presents for evaluation of worsening low back pain and abdominal pain. Now s/p cholecystectomy 1/31.  Clinical Impression  Pt admitted with above diagnosis. Pt currently with functional limitations due to the deficits listed below (see PT Problem List). Patient presenting with decreased functional mobility secondary to decreased gait speed, abnormal posture, gait abnormalities, and balance impairments. Pt ambulating 120 feet with walker and supervision. Gait speed indicative that pt is at high risk for falls. Educated pt on functional strengthening exercises for home per pt daughter request. Recommending HHPT to address deficits and maximize functional independence. Pt/pt family in agreement with recommendation.     Follow Up Recommendations Home health PT    Equipment Recommendations  None recommended by PT    Recommendations for Other Services       Precautions / Restrictions Precautions Precautions: Fall Restrictions Weight Bearing Restrictions: No      Mobility  Bed Mobility               General bed mobility comments: OOB in chair  Transfers Overall transfer level: Needs assistance Equipment used: Rolling walker (2 wheeled) Transfers: Sit to/from Stand Sit to Stand: Min guard         General transfer comment: increased time and cues to scoot forward  Ambulation/Gait Ambulation/Gait assistance: Supervision Gait Distance (Feet): 120 Feet Assistive device: Rolling walker (2 wheeled) Gait Pattern/deviations: Decreased stride length;Step-to pattern;Trunk flexed   Gait velocity interpretation: <1.31 ft/sec, indicative of household ambulator General  Gait Details: Slow cadence, festinating gait pattern, difficulty negotiating obstacles. cues for walker proximity to promote upright posture  Stairs            Wheelchair Mobility    Modified Rankin (Stroke Patients Only)       Balance Overall balance assessment: Needs assistance Sitting-balance support: Feet supported Sitting balance-Leahy Scale: Good     Standing balance support: Bilateral upper extremity supported Standing balance-Leahy Scale: Fair                               Pertinent Vitals/Pain Pain Assessment: Faces Faces Pain Scale: Hurts a little bit Pain Location: right hip, shoulder Pain Descriptors / Indicators: Aching Pain Intervention(s): Monitored during session;Premedicated before session    Home Living Family/patient expects to be discharged to:: Private residence Living Arrangements: Spouse/significant other Available Help at Discharge: Family Type of Home: House Home Access: Stairs to enter;Ramped entrance   Entrance Stairs-Number of Steps: 4   Home Equipment: Clinical cytogeneticist - 2 wheels;Walker - 4 wheels;Cane - single point Additional Comments: Husband does have mild dementia. Daughter lives 1 mile away    Prior Function Level of Independence: Independent with assistive device(s)         Comments: uses RW inside, Rollator outside     Hand Dominance        Extremity/Trunk Assessment   Upper Extremity Assessment Upper Extremity Assessment: Overall WFL for tasks assessed    Lower Extremity Assessment Lower Extremity Assessment: Overall WFL for tasks assessed    Cervical / Trunk Assessment Cervical / Trunk Assessment: Kyphotic  Communication   Communication: No difficulties  Cognition Arousal/Alertness: Awake/alert Behavior During Therapy: WFL for tasks assessed/performed Overall  Cognitive Status: Within Functional Limits for tasks assessed                                        General  Comments      Exercises     Assessment/Plan    PT Assessment Patient needs continued PT services  PT Problem List Decreased strength;Decreased activity tolerance;Decreased balance;Decreased mobility;Decreased coordination       PT Treatment Interventions DME instruction;Gait training;Stair training;Functional mobility training;Therapeutic activities;Therapeutic exercise;Balance training;Patient/family education    PT Goals (Current goals can be found in the Care Plan section)  Acute Rehab PT Goals Patient Stated Goal: "get stronger." PT Goal Formulation: With patient Time For Goal Achievement: 07/23/18 Potential to Achieve Goals: Good    Frequency Min 3X/week   Barriers to discharge        Co-evaluation               AM-PAC PT "6 Clicks" Mobility  Outcome Measure Help needed turning from your back to your side while in a flat bed without using bedrails?: None Help needed moving from lying on your back to sitting on the side of a flat bed without using bedrails?: A Little Help needed moving to and from a bed to a chair (including a wheelchair)?: A Little Help needed standing up from a chair using your arms (e.g., wheelchair or bedside chair)?: A Little Help needed to walk in hospital room?: A Little Help needed climbing 3-5 steps with a railing? : A Lot 6 Click Score: 18    End of Session Equipment Utilized During Treatment: Gait belt Activity Tolerance: Patient tolerated treatment well Patient left: in chair;with call bell/phone within reach;with family/visitor present Nurse Communication: Mobility status PT Visit Diagnosis: Other abnormalities of gait and mobility (R26.89);Difficulty in walking, not elsewhere classified (R26.2)    Time: 7209-4709 PT Time Calculation (min) (ACUTE ONLY): 26 min   Charges:   PT Evaluation $PT Eval Moderate Complexity: 1 Mod PT Treatments $Gait Training: 8-22 mins       Ellamae Sia, PT, DPT Acute Rehabilitation  Services Pager [phone] Office [phone]   Willy Eddy 07/09/2018, 3:30 PM

## 2018-07-09 NOTE — Progress Notes (Addendum)
ANTICOAGULATION CONSULT NOTE  Pharmacy Consult: Heparin and restart Warfarin Indication: atrial fibrillation  Allergies  Allergen Reactions  . Lyrica [Pregabalin] Other (See Comments)    Felt loopy  . Zocor [Simvastatin] Other (See Comments)    Myalgias  . Relafen [Nabumetone] Rash    Patient Measurements: Height: 5\' 5"  (165.1 cm) Weight: 185 lb 3 oz (84 kg) IBW/kg (Calculated) : 57  Heparin dosing weight = 84 kg  Vital Signs: Temp: 98.2 F (36.8 C) (02/02 0411) Temp Source: Oral (02/02 0411) BP: 165/73 (02/02 0411) Pulse Rate: 70 (02/02 0411)  Labs: Recent Labs    07/07/18 0343 07/08/18 0556 07/08/18 1125 07/08/18 1330 07/09/18 0408  HGB 12.5 12.6  --   --  12.6  HCT 40.0 40.7  --   --  41.0  PLT 141* 162  --   --  171  LABPROT 15.1 14.7  --   --  15.6*  INR 1.20 1.16  --   --  1.25  HEPARINUNFRC  --  0.55 0.52  --  0.72*  CREATININE 0.98 0.89  --  1.07*  --     Estimated Creatinine Clearance: 48.6 mL/min (A) (by C-G formula based on SCr of 1.07 mg/dL (H)).   Assessment: 27 YOF with history of Afib on Coumadin PTA.  Patient presented with worsening low back pain and abdominal pain.  Pharmacy initially consulted to dose IV heparin while Coumadin is on hold for surgery (last Coumadin dose on 07/03/18). Now POD #1 s/p lap cholecystectomy. Pharmacy consulted for heparin.  PTA warfarin dose:  *dose per pt: 2.5 mg daily except none on Tues/Thurs *dose per clinic: 2.5mg  daily except 1.25mg  on Tues/Thurs   2/2: Heparin level this morning slightly supratherapeutic at 0.72, on heparin drip at 800 units/hr. INR is 1.25 after 4mg  warfarin yesterday. Will return to PTA dosing schedule. No bleeding reported per RN.  CBC is within normal limits.  Goal of Therapy:  Heparin level 0.3-0.7 units/mL INR 2-3 Monitor platelets by anticoagulation protocol: Yes   Plan:  Decrease heparin to 750 units/hr Heparin level at 1400 today Give Warfarin 2.5mg  daily Daily heparin level,  INR, CBC.   Janae Bridgeman, PharmD PGY1 Pharmacy Resident Phone: [phone] 07/09/2018 7:25 AM

## 2018-07-09 NOTE — Progress Notes (Signed)
Pt discharged to home in stable condition. All discharge instructions reviewed with pt and pt's daughters x2.  Pt transported via wheelchair with transporters x 1 at chairside.  [id number]

## 2018-07-09 NOTE — Discharge Summary (Signed)
Physician Discharge Summary  [name] PNT:[id number] DOB: Apr 20, 1941 DOA: 07/01/2018  PCP: [name], MD  Admit date: 07/01/2018 Discharge date: 07/09/2018  Admitted From: Home Disposition: Home with [place] PT/OT/RN/Aide  Recommendations for Outpatient Follow-up:  1. Follow up with PCP in 1-2 weeks 2. Follow up with General Surgery in 1-2 weeks 3. Follow up with Dr. Carles Collet within 1 week  4. Follow up with Gastroenterology Dr. Cristina Gong in 1-2 weeks 5. Follow up with Cardiology [name] in 1-2 weeks 6. Please obtain CMP/CBC, Mag, Phos in one week 7. Continue to Monitor Daily PT-INR's while on Coumadin and Lovenox Bridge 8. Please follow up on the following pending results:  Home Health: Yes Equipment/Devices: None recommended by PT  Discharge Condition: Stable CODE STATUS: FULL CODE Diet recommendation: Heart Healthy Diet  Brief/Interim Summary: [name] a 78 y.o.femalewith medical history significant ofParkinson's disease, congestive heart failure with preserved ejection fraction, chronic back pain, degenerative joint disease, gastroesophageal reflux disease, hyperlipidemia, hypertension and paroxysmal atrial fibrillation on warfarin who presents for evaluation of worsening onset of progressively worsening low back pain and abdominal pain which has been present for at least 3 months.   Thoracolumbar MRI> several indicators of discitis-osteomyelitis at the T12-L1 level. The patient was started on Vanc and Cefepime and admitted. Further workup deemed that she likely did not have a discitis/Osteomyelitis and Abx were D/C'd. Patient was going to be D/C'd but continued to complain of Abdominal Pain so GI was consulted and a HIDA Scan was done which showed a low EF. General Surgery was consulted and plan on taking the patient for a cholecystectomy Friday. Cardiology was consulted for preoperative Clearance and she has been cleared for Surgery.    She underwent Surgical intervention on 07/07/2018 and had a Laparoscopic Cholecystectomy. She was doing well and Surgery recommended resuming Coumadin and advanced diet. Unfortunately she started choking when she started eating so she was made NPO and will get SLP involved. Coumadin resumed yesterday and will change to a Lovenox bridge instead of Heparin gtt.  Hospitalization has been complicated by Delirium/Confusion with patient hallucinating. Neurology consulted for further evaluation and recommendations and they have no further recommendations but decreasing the patient's Sinemet. I discussed the case with Dr. Rory Percy and he states this is progression of the disease and the patient will do better in a Home environment. She was deemed medically stable to D/C and will need to follow up with PCP, General Surgery, Cardiology, Gastroenterology and Neurology in the outpatient setting.   Discharge Diagnoses:  Principal Problem:   Discitis of thoracolumbar region Active Problems:   Osteoarthritis   Obstructive sleep apnea   Atrial fibrillation with RVR (HCC)   Chronic anticoagulation -warfarin therapy   Parkinsonism (HCC)   Preoperative cardiovascular examination  Chronic Back Pain Discitis of thoracolumbar region? -S/p multiple back surgeries by neuro surgery- pain appears to chronic ? Discitison MRI- patient is not tender in the area of supposed discitis- Dr. Wynelle Cleveland discontinued the antibiotics andasked for a Neurosurgeryand ID opinion - the conclusion is that it is not likely she has a discitis and at this time does not need further work up  -In regards to her chronic back-she states she was prescribed PT which exacerbated thisbut family concerned and requesting Home Health so PT re-consulted and recommending Home Health PT - She states the pain moves around and there is no radiation down her legs and no other neurological symptoms -There is moderate thoracic stenosis on the MRIwhich  Dr.  Wynelle Cleveland discussed with NS-NS does not feel it needs surgery or further inpatient management  -It appears to be muscular pain and she was started on Voltaren Gel -C/w Home Pain regimen at D/C -Follow up with PCP at D/C    Abdominal Bloating and Pain when eating, improving  Biliary Dyskinesia s/o Laparscopic Cholecystectomy POD2 -GI consulted- her pain did not seem consistent with gallbladder disease but a HIDA scan was ordered- it showed a low EF; Some symptoms may be attributable to the Gallbladder but certainly not all -GI discussed with the patient that her pain may persist even with the removal of the gallbladder -General Surgery consulted and undewent Cholecystectomy 07/07/2018 and is doing well -Diet was advanced and patient stated that her abdominal pain had improved and just felt "sore" from the surgery -Unfortunately she started choking on Diet she was put on so she was made NPO and SLP to evaluate but has passed SLP and now on Heart Healthy Diet  -C/w Pantoprazole 40 mg po BID  -Cardiology Consulted for Pre-operative Clearance and she was cleared   -Follow up with Gastroenterology Dr. Cristina Gong within 1-2 weeks -Follow up with General Surgery within 1 week   Obstructive Sleep Apnea -Does not use CPAP -Follow up with PCP at D/C   Paroxysmal A Fib  -Cont Flecainide 50 mg po BID  -Coumadinper pharmacy was stopped and changed to IV Heparin for Surgery; Remained on IV Heparin gtt for bridging but will change to Lovenox Bridge today in anticipation of D/C  -Cardiology consulted for Pre-operative Clearance and cleared patient  -Resumed Coumadin post-operatively -PT was 15.6 and INR was 1.25 -Will Send out on Lovenox injections 1 mg/kg x 6 days and recommending Daily INR checks at PCP office or Cardiology office within the coming week -Will need to follow up with Dr. 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 Carles Collet at D/C   Obesity -Estimated body mass index is 30.82 kg/m as calculated from the following:   Height as of this encounter: 5\' 5"  (1.651 m).   Weight as of this encounter: 84 kg. -Weight Loss Counseling given   HLD -C/w Pravastatin 40 mg po qHS  HTN -C/w Metoprolol Succinate 100 mg po Daily  -Continue to Monitor Blood Pressures carefully  Normocytic Anemia -Patient's Hb/Hct stable and now 12.6/41.0 -Continue to Monitor for S/Sx of Bleeding as patient is on Anticoagulation -Repeat CBC in AM   Dysphagia/Choking, improved  -Choked on the diet today after it was advanced by Surgery and had difficulty swallowing -Made NPO and SLP to Evaluate -Follow Diet Recommendations per SLP evaluation and they recommend Heart Healthy Diet   Hallucinations -Worsened in the hospital setting -? Worsening Parkinson's Dementia vs. Hospital Delirium -Placed on Delirium Precautions  -Was going to obtain MRI of the Brain w/o Contrast but cancelled because Neurology stated not necessary -Will decrease Sinemet Dose based on Neurology Recommendations -Neurology consulted and feel it is apart of Her Parkinsonism  disease process and recommending following up with Dr. Carles Collet within 1 week   Renal Insuffiencey -Mild. 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 -BUN/Cr went from 23/0.92 -> 24/0.98 -> 14/0.89 -> 20/1.07 -Avoid Nephrotoxic Medications and IV Contrast Dyes if possible -Continue to Monitor and Trend Renal Fxn -Repeat CMP as an outpatient    Discharge Instructions  Discharge Instructions    Call MD for:  difficulty breathing, headache or visual disturbances   Complete by:  As directed    Call MD for:  extreme fatigue   Complete by:  As directed    Call MD for:  hives   Complete by:  As directed    Call MD for:  persistant dizziness or light-headedness   Complete by:  As directed    Call MD for:  persistant nausea and vomiting   Complete by:  As directed    Call MD for:  redness, tenderness, or signs of infection (pain, swelling, redness, odor or green/yellow discharge around incision site)   Complete by:  As directed    Call MD for:  severe uncontrolled pain   Complete by:  As directed    Call MD for:  temperature >100.4   Complete by:  As directed    Diet - low sodium heart healthy   Complete by:  As directed    Diet - low sodium heart healthy   Complete by:  As directed    Discharge instructions   Complete by:  As directed    You were cared for by a hospitalist during your hospital stay. If you have any questions about your discharge medications or the care you received while you were in the hospital after you are discharged, you can call the unit and ask to speak with the hospitalist on call if the hospitalist that took care of you is not available. Once you are discharged, your primary care physician will handle any further medical issues. Please note that NO REFILLS for any discharge medications will be authorized once you are discharged, as it is imperative that you return to your primary care physician (or establish a relationship with a primary care physician if you do not have one) for your aftercare needs so that they can reassess your need for medications and monitor your lab values.  Follow up with PCP, General Surgery, Cardiology, Gastroenterology and Neurology as an outpatient. Take all medications as prescribed. If symptoms change or worsen please return to the ED for evaluation   Increase activity slowly    Complete by:  As directed    Increase activity slowly   Complete by:  As directed      Allergies as of 07/09/2018      Reactions   Lyrica [pregabalin] Other (See Comments)   Felt loopy   Zocor [simvastatin] Other (See Comments)   Myalgias   Relafen [nabumetone] Rash      Medication List    TAKE these medications   acetaminophen 325 MG tablet Commonly known as:  TYLENOL Take 325 mg by mouth daily as needed for mild pain (pain).   calcium carbonate 500 MG chewable tablet Commonly known as:  TUMS - dosed in mg elemental calcium Chew 1 tablet by mouth as needed for indigestion or heartburn.   carbidopa-levodopa 25-100 MG tablet Commonly known as:  SINEMET IR Take 2 tablets by mouth 3 (three) times daily. What changed:  when to take this   chlorzoxazone 500 MG tablet Commonly known as:  PARAFON Take 500 mg  by mouth 2 (two) times daily.   enoxaparin 80 MG/0.8ML injection Commonly known as:  LOVENOX Inject 0.85 mLs (85 mg total) into the skin every 12 (twelve) hours for 6 days. HAVE PCP or CARDIOLOGY FOLLOW INR DAILY UNTIL THERAPEUTIC   fexofenadine 180 MG tablet Commonly known as:  ALLEGRA Take 180 mg by mouth as needed. For allergies   flecainide 50 MG tablet Commonly known as:  TAMBOCOR TAKE 1 TABLET BY MOUTH 2 TIMES DAILY   furosemide 20 MG tablet Commonly known as:  LASIX Take 40 mg by mouth as needed.   gabapentin 300 MG capsule Commonly known as:  NEURONTIN Take 300 mg by mouth at bedtime.   lidocaine 5 % Commonly known as:  LIDODERM Place 1-2 patches onto the skin every 12 (twelve) hours for 30 days. Remove & Discard patch within 12 hours or as directed by MD   metoprolol succinate 100 MG 24 hr tablet Commonly known as:  TOPROL-XL TAKE 1 TABLET BY MOUTH DAILY   oxyCODONE-acetaminophen 5-325 MG tablet Commonly known as:  PERCOCET/ROXICET Take 1-2 tablets by mouth every 6 (six) hours as needed.   pantoprazole 40 MG tablet Commonly known as:   PROTONIX Take 40 mg by mouth 2 (two) times daily.   polyethylene glycol packet Commonly known as:  MIRALAX / GLYCOLAX Take 17 g by mouth daily. Start taking on:  July 10, 2018   potassium chloride SA 20 MEQ tablet Commonly known as:  K-DUR,KLOR-CON Take 40 mEq by mouth daily.   pramipexole 0.5 MG tablet Commonly known as:  MIRAPEX Take 0.5-1 mg by mouth at bedtime. For restless legs   pravastatin 40 MG tablet Commonly known as:  PRAVACHOL Take 40 mg by mouth at bedtime.   PROBIOTIC-10 PO Take by mouth daily.   TRAVATAN Z 0.004 % Soln ophthalmic solution Generic drug:  Travoprost (BAK Free) Place 1 drop into both eyes at bedtime.   warfarin 2.5 MG tablet Commonly known as:  COUMADIN Take as directed. If you are unsure how to take this medication, talk to your nurse or doctor. Original instructions:  Take 1 tablet (2.5 mg total) by mouth as directed. What changed:    when to take this  additional instructions      Follow-up Information    [facility] Follow up on 07/21/2019.   Specialty:  General Surgery Why:  02/13 at 2:45 pm. Please arrive 30 minutes early. Please bring a copy of your photo ID and insurance card.  Contact information: [place] [address] [phone]        [name], MD. Call.   Specialty:  Family Medicine Why:  Follow up within 1 week  Contact information: [address] [place] [address] [phone]        Josue Hector, MD .   Specialty:  Cardiology Contact information: [phone] [place] [address] [phone]        Tat, Eustace Quail, DO. Call.   Specialty:  Neurology Why:  Follow up with in 1 week Contact information: [place] [address] [phone]        Ronald Lobo, MD. Call.   Specialty:  Gastroenterology Why:  Follow up within 1 week  Contact information: [address] [place] [address] [phone]          Allergies  Allergen Reactions  . Lyrica [Pregabalin] Other (  See Comments)    Felt loopy  . Zocor [Simvastatin] Other (See Comments)    Myalgias  . Relafen [Nabumetone] Rash   Consultations:  Cardiology  Gastroenterology  General Surgery  Neurosurgery  Infectious Diseases  Neurology  Procedures/Studies: Mr Thoracic Spine W Wo Contrast  Result Date: 07/01/2018 CLINICAL DATA:  Back pain, possible discitis at T12-L1, for further assessment. EXAM: MRI THORACIC AND LUMBAR SPINE WITHOUT CONTRAST TECHNIQUE: Multiplanar and multiecho pulse sequences of the thoracic and lumbar spine were obtained without intravenous contrast. COMPARISON:  07/01/2018 CT scan, and prior lumbar MRI from 09/29/2011 FINDINGS: Despite efforts by the technologist and patient, severe motion artifact is present on today's exam and could not be eliminated. This reduces exam sensitivity and specificity. MRI THORACIC SPINE FINDINGS Alignment:  No vertebral subluxation is observed. Vertebrae: There is evidence of spondylosis and degenerative disc disease in the cervical spine which is not well characterized. Dedicated cervical spine MRI with and without contrast could be utilized if definitive characterization is warranted. Abnormal reduced T1 signal in the T12 vertebral body with fluid signal in the T12-L1 intervertebral disc, endplate irregularities, and paraspinal edema at this level, appearance raises high suspicion for discitis-osteomyelitis. No obvious epidural abscess. Cord:  Unremarkable Paraspinal and other soft tissues: As noted above, there is paraspinal edema at the T12 level. Left kidney upper pole parapelvic cyst. Disc levels: No significant thoracic spine findings above the T7-8 level. T7-8: No impingement.  Right paracentral disc protrusion. T8-9: No impingement.  Right paracentral disc protrusion. T9-10: No impingement. Left paracentral/lateral  recess disc protrusion. Right perineural cyst. T10-11: No impingement.  Left facet arthropathy. T11-12: No impingement.  Mild disc bulge. T12-L1: Deferred for the lumbar spine report below. MRI LUMBAR SPINE FINDINGS Segmentation: The lowest lumbar type non-rib-bearing vertebra is labeled as L5. Alignment: 3 mm of degenerative retrolisthesis at L1-2. 3 mm of fused posterior listhesis at L2-3. Vertebrae: Posterolateral rod and pedicle screw fixators with pedicle screws at all levels between L1 and S1 bilaterally. There is inferior endplate concavity at R42 and anterior wedging at L1 with the pedicle screws at L1 potentially capturing the superior cortical surface. Fluid signal at T12-L1 but with only a equivocal enhancement in the intervertebral disc space. Paraspinal edema at this level. No appreciable findings of epidural abscess subject to the limitations caused by motion artifact and metal artifact. Posterior decompression at L4 and L5 with 4.8 by 1.8 by 1.8 cm fluid collection in this vicinity without significant enhancement along its margins. No communication of this fluid collection with the thecal sac is observed. Conus medullaris and cauda equina: Conus extends to the L1 level. Conus and cauda equina appear normal. Paraspinal and other soft tissues: As noted above there is paraspinal edema at the T12-L1 level. Bilateral renal fluid signal intensity lesions favor cysts. Disc levels: T12-L1: Moderate central narrowing of the thecal sac at this level due to disc bulge as well as prominence of the posterior epidural adipose tissues. L1-2: No impingement. Fused level. Posterior intervertebral spurring. Posterior decompression at L1-2. L2-3: No impingement. Fused level. Posterior intervertebral spurring. L3-4: Borderline right foraminal stenosis due to intervertebral spurring. L4-5: Borderline left foraminal stenosis due to intervertebral spurring. L5-S1: No impingement.  Fused level. IMPRESSION: 1. There several  indicators of discitis-osteomyelitis at the T12-L1 level. There is fluid signal in the disc along with considerable edema signal and some enhancement in the vertebral bodies, along with endplate irregularities and compression. There is also paraspinal edema. The only factor unusual for discitis  is the relative lack of enhancement in the disc, which is mild if present at all. The a presumptive diagnosis of discitis- osteomyelitis is recommended and consider sampling the disc. No definite epidural abscess. 2. There are findings of degenerative disc disease and spondylosis in the thoracic and lumbar spine, but the only impingement is at the T12-L1 level. At this level, there is moderate central narrowing of the thecal sac due to disc bulge and prominence of the posterior epidural adipose tissues. 3. Posterolateral rod and pedicle screw fixation extending from L1 through S1. Fluid collection along the posterior decompression at L4-5 does not appear to communicate with the joint and is likely a chronic incidental postoperative collection. Electronically Signed   By: Van Clines M.D.   On: 07/01/2018 14:35   Mr Lumbar Spine W Wo Contrast (assess For Abscess, Cord Compression)  Result Date: 07/01/2018 CLINICAL DATA:  Back pain, possible discitis at T12-L1, for further assessment. EXAM: MRI THORACIC AND LUMBAR SPINE WITHOUT CONTRAST TECHNIQUE: Multiplanar and multiecho pulse sequences of the thoracic and lumbar spine were obtained without intravenous contrast. COMPARISON:  07/01/2018 CT scan, and prior lumbar MRI from 09/29/2011 FINDINGS: Despite efforts by the technologist and patient, severe motion artifact is present on today's exam and could not be eliminated. This reduces exam sensitivity and specificity. MRI THORACIC SPINE FINDINGS Alignment:  No vertebral subluxation is observed. Vertebrae: There is evidence of spondylosis and degenerative disc disease in the cervical spine which is not well characterized.  Dedicated cervical spine MRI with and without contrast could be utilized if definitive characterization is warranted. Abnormal reduced T1 signal in the T12 vertebral body with fluid signal in the T12-L1 intervertebral disc, endplate irregularities, and paraspinal edema at this level, appearance raises high suspicion for discitis-osteomyelitis. No obvious epidural abscess. Cord:  Unremarkable Paraspinal and other soft tissues: As noted above, there is paraspinal edema at the T12 level. Left kidney upper pole parapelvic cyst. Disc levels: No significant thoracic spine findings above the T7-8 level. T7-8: No impingement.  Right paracentral disc protrusion. T8-9: No impingement.  Right paracentral disc protrusion. T9-10: No impingement. Left paracentral/lateral recess disc protrusion. Right perineural cyst. T10-11: No impingement.  Left facet arthropathy. T11-12: No impingement.  Mild disc bulge. T12-L1: Deferred for the lumbar spine report below. MRI LUMBAR SPINE FINDINGS Segmentation: The lowest lumbar type non-rib-bearing vertebra is labeled as L5. Alignment: 3 mm of degenerative retrolisthesis at L1-2. 3 mm of fused posterior listhesis at L2-3. Vertebrae: Posterolateral rod and pedicle screw fixators with pedicle screws at all levels between L1 and S1 bilaterally. There is inferior endplate concavity at E33 and anterior wedging at L1 with the pedicle screws at L1 potentially capturing the superior cortical surface. Fluid signal at T12-L1 but with only a equivocal enhancement in the intervertebral disc space. Paraspinal edema at this level. No appreciable findings of epidural abscess subject to the limitations caused by motion artifact and metal artifact. Posterior decompression at L4 and L5 with 4.8 by 1.8 by 1.8 cm fluid collection in this vicinity without significant enhancement along its margins. No communication of this fluid collection with the thecal sac is observed. Conus medullaris and cauda equina: Conus  extends to the L1 level. Conus and cauda equina appear normal. Paraspinal and other soft tissues: As noted above there is paraspinal edema at the T12-L1 level. Bilateral renal fluid signal intensity lesions favor cysts. Disc levels: T12-L1: Moderate central narrowing of the thecal sac at this level due to disc bulge as well as  prominence of the posterior epidural adipose tissues. L1-2: No impingement. Fused level. Posterior intervertebral spurring. Posterior decompression at L1-2. L2-3: No impingement. Fused level. Posterior intervertebral spurring. L3-4: Borderline right foraminal stenosis due to intervertebral spurring. L4-5: Borderline left foraminal stenosis due to intervertebral spurring. L5-S1: No impingement.  Fused level. IMPRESSION: 1. There several indicators of discitis-osteomyelitis at the T12-L1 level. There is fluid signal in the disc along with considerable edema signal and some enhancement in the vertebral bodies, along with endplate irregularities and compression. There is also paraspinal edema. The only factor unusual for discitis is the relative lack of enhancement in the disc, which is mild if present at all. The a presumptive diagnosis of discitis- osteomyelitis is recommended and consider sampling the disc. No definite epidural abscess. 2. There are findings of degenerative disc disease and spondylosis in the thoracic and lumbar spine, but the only impingement is at the T12-L1 level. At this level, there is moderate central narrowing of the thecal sac due to disc bulge and prominence of the posterior epidural adipose tissues. 3. Posterolateral rod and pedicle screw fixation extending from L1 through S1. Fluid collection along the posterior decompression at L4-5 does not appear to communicate with the joint and is likely a chronic incidental postoperative collection. Electronically Signed   By: Van Clines M.D.   On: 07/01/2018 14:35   Ct Abdomen Pelvis W Contrast  Result Date:  07/01/2018 CLINICAL DATA:  Right upper quadrant abdominal pain. EXAM: CT ABDOMEN AND PELVIS WITH CONTRAST TECHNIQUE: Multidetector CT imaging of the abdomen and pelvis was performed using the standard protocol following bolus administration of intravenous contrast. CONTRAST:  131mL OMNIPAQUE IOHEXOL 300 MG/ML  SOLN COMPARISON:  MR 12/17/2017 . FINDINGS: Lower chest: No acute abnormality. Hepatobiliary: 6 mm cyst within the central liver as noted on previous MRI. No suspicious liver abnormality. Gallbladder sludge and tiny stone identified within the fundus. Stone measures approximately 4 mm. No gallbladder wall thickening or pericholecystic fluid. No biliary ductal dilatation. Pancreas: Unremarkable. No pancreatic ductal dilatation or surrounding inflammatory changes. Spleen: Normal in size without focal abnormality. Adrenals/Urinary Tract: Normal appearance of the adrenal glands. Left kidney cysts are again identified. No kidney mass or hydronephrosis identified bilaterally. Urinary bladder appears normal. Stomach/Bowel: Stomach is normal. The small bowel loops have a normal course and caliber. Extensive distal colonic diverticulosis without acute inflammation. Vascular/Lymphatic: Aortic atherosclerosis. No aneurysm. No abdominopelvic adenopathy identified. Reproductive: Status post hysterectomy. No adnexal masses. Other: No free fluid or fluid collections. Musculoskeletal: Extensive thoracolumbar spondylosis. Previous posterior hardware fixation of L1 through S1. Advanced spondylosis is identified at the T12-L1 level. IMPRESSION: 1. No acute findings within the abdomen or pelvis. 2. Gallbladder sludge and tiny stone noted within the fundus. No secondary signs of acute cholecystitis. No biliary ductal dilatation. 3. Kidney cysts 4.  Aortic Atherosclerosis (ICD10-I70.0). Electronically Signed   By: Kerby Moors M.D.   On: 07/01/2018 08:09   Nm Hepato W/eject Fract  Result Date: 07/04/2018 CLINICAL DATA:  Right  upper quadrant abdominal pain. EXAM: NUCLEAR MEDICINE HEPATOBILIARY IMAGING WITH GALLBLADDER EF TECHNIQUE: Sequential images of the abdomen were obtained out to 60 minutes following intravenous administration of radiopharmaceutical. After oral ingestion of Ensure, gallbladder ejection fraction was determined. At 60 min, normal ejection fraction is greater than 33%. RADIOPHARMACEUTICALS:  5.3 mCi Tc-48m  Choletec IV COMPARISON:  CT scan of July 01, 2018. FINDINGS: Prompt uptake and biliary excretion of activity by the liver is seen. Gallbladder activity is visualized, consistent with patency of  cystic duct. Biliary activity passes into small bowel, consistent with patent common bile duct. Calculated gallbladder ejection fraction is 24%, which is below normal. Patient did report discomfort in her back after ingesting Ensure. (Normal gallbladder ejection fraction with Ensure is greater than 33%.) IMPRESSION: Gallbladder ejection fraction of 24% was noted after Ensure administration, which is below normal and concerning for biliary dyskinesis. Electronically Signed   By: Marijo Conception, M.D.   On: 07/04/2018 13:54   Ct L-spine No Charge  Result Date: 07/01/2018 CLINICAL DATA:  Low back pain EXAM: CT lumbar spine with contrast TECHNIQUE: Multiplanar CT images of the lumbar spine were reconstructed from contemporary CT of the abdomen. CONTRAST:  None additional COMPARISON:  Lumbar radiography 05/16/2018 FINDINGS: Segmentation: 5 lumbar type vertebral bodies Alignment: Unremarkable Vertebrae: L1-S1 fusion. Interbody arthrodesis is solid from L2-S1. Interbody arthrodesis is not clearly demonstrated at L1-2 but there is posterior-lateral arthrodesis on the right at this level and no hardware failure. Endplate sclerosis and lucency at T12-L1, likely degenerative given vacuum phenomenon and appearance on comparison radiography. There is mild haziness of fat about this level needing correlation with labs. Paraspinal and  other soft tissues: As above. Abdomen and pelvis CT reported separately Disc levels: T12- L1: Endplate lucency and sclerosis with anterior disc space widening where there is vacuum phenomenon. Degenerative spurring. Mild posterior element hypertrophy. Patent canal and foramina L1-L2: ACDF with posterior-lateral solid arthrodesis. Patent canal after laminectomy L2-L3: Solid arthrodesis.  No evident impingement. L3-L4: Solid arthrodesis. No evident impingement. L4-L5: Solid arthrodesis. No evident impingement. L5-S1:Solid arthrodesis. No evident impingement. IMPRESSION: 1. Severe adjacent segment disc degeneration at T12-L1. There is mild inflammation of paravertebral fat, please ensure no clinical/laboratory signs of superimposed infection. 2. L1-S1 spinal fusion. Electronically Signed   By: Monte Fantasia M.D.   On: 07/01/2018 08:03     Subjective: Seen and examined today he was doing well postoperatively.  States her abdomen was still a little sore.  No chest pain, lightheadedness or dizziness.  Understands she needs to take small bites.  Daughter is concerned about her confusion and patient then started having visual hallucinations later this afternoon.  Likely in the setting of hospital delirium versus worsening Parkinson's dementia.  Neurology formally consulted and I discussed the case with Dr. Rory Percy who recommends decreasing thepatient Sinemet and D/C'ing home to a familiar environment. Lovenox started for Bridging with Coumadin. She was deemed medically stable to D/C and will follow up with PCP, Cardiology, Gastroenterology, Neurology and General Surgery in the outpatient setting.  Discharge Exam:  Vitals:   07/08/18 1743 07/08/18 1934 07/09/18 0411 07/09/18 0953  BP: (!) 129/53 (!) 116/44 (!) 165/73 108/77  Pulse: 75 62 70 72  Resp:  14  14  Temp: 98.1 F (36.7 C) 97.6 F (36.4 C) 98.2 F (36.8 C) 98.5 F (36.9 C)  TempSrc: Oral Oral Oral Oral  SpO2: 97% 97% 99% 96%  Weight:      Height:        Physical Exam:  Constitutional: Well-nourished, well-developed obese Caucasian female currently no acute distress and sitting in a chair about to eat breakfast appears calm and comfortable but she is a little confused and does not know where she is but she is alert and oriented to person, situation, and knows the president, the month and the year. Eyes: Conjunctive are normal.  Sclera anicteric ENMT: External ears and nose appear normal.  Grossly normal hearing Neck: Appears supple with no JVD Respiratory: Slightly diminished to  auscultation bilaterally no appreciable wheezing, rales, rhonchi.  Patient not tachypneic wheezing and accessory muscles to breathe Cardiovascular: Regular rate and rhythm.  No appreciable murmurs, rubs, gallops. Abdomen: Soft, mildly tender to palpate.  Distended secondary body habitus.  Bowel sounds present GU: Deferred Musculoskeletal: No contractures or cyanosis.  No joint formerly is noted Skin: Skin is warm and dry no appreciable rashes or lesions limited skin evaluation.  Abdominal incisions appear clean dry and intact Neurologic: Cranial nerves II through XII grossly intact no appreciable focal deficits Psychiatric: She is awake and alert and only oriented x2.  She does not know where she is.  Has a normal mood and affect.  Subsequently she became confused and started hallucinating later on this afternoon but Neurology consulted and recommended D/C Home to a familiar environment  The results of significant diagnostics from this hospitalization (including imaging, microbiology, ancillary and laboratory) are listed below for reference.    Microbiology: Recent Results (from the past 240 hour(s))  Blood culture (routine x 2)     Status: None   Collection Time: 07/01/18  8:15 AM  Result Value Ref Range Status   Specimen Description BLOOD LEFT WRIST  Final   Special Requests   Final    BOTTLES DRAWN AEROBIC AND ANAEROBIC Blood Culture results may not be  optimal due to an inadequate volume of blood received in culture bottles   Culture   Final    NO GROWTH 5 DAYS Performed at [facility], [place] [address], [place], [place] [address]    Report Status 07/06/2018 FINAL  Final  Blood culture (routine x 2)     Status: None   Collection Time: 07/01/18  8:30 AM  Result Value Ref Range Status   Specimen Description BLOOD RIGHT ANTECUBITAL  Final   Special Requests   Final    BOTTLES DRAWN AEROBIC AND ANAEROBIC Blood Culture adequate volume   Culture   Final    NO GROWTH 5 DAYS Performed at [facility], [place] [address], [place], [place] [address]    Report Status 07/06/2018 FINAL  Final  Surgical pcr screen     Status: None   Collection Time: 07/06/18 12:46 PM  Result Value Ref Range Status   MRSA, PCR NEGATIVE NEGATIVE Final   Staphylococcus aureus NEGATIVE NEGATIVE Final    Comment: (NOTE) The Xpert SA Assay (FDA approved for NASAL specimens in patients 48 years of age and older), is one component of a comprehensive surveillance program. It is not intended to diagnose infection nor to guide or monitor treatment. Performed at [facility], [place] [address], [place], [place] [address]     Labs: BNP (last 3 results) No results for input(s): BNP in the last 8760 hours. Basic Metabolic Panel: Recent Labs  Lab 07/05/18 0955 07/06/18 0627 07/07/18 0343 07/08/18 0556 07/08/18 1330 07/09/18 0408  NA 140 143 139 143 143  --   K 4.2 4.0 4.2 3.9 4.1  --   CL 107 111 107 107 109  --   CO2 26 25 24 24 22   --   GLUCOSE 97 84 90 137* 101*  --   BUN 23 24* 24* 14 20  --   CREATININE 0.92 0.98 0.98 0.89 1.07*  --   CALCIUM 9.6 9.0 9.4 9.9 9.5  --   MG 2.0 1.9 1.8 1.8  --  1.9  PHOS 3.2 3.6 3.6 3.6  --  3.0   Liver Function Tests: Recent Labs  Lab 07/05/18 0955 07/06/18 0627 07/07/18 0343 07/08/18 0556  AST 18 23 19 28   ALT 5 7 6 7   ALKPHOS 93 82 87 89  BILITOT 0.7 0.6 0.3 0.6  PROT 7.2 6.5 6.7 7.0  ALBUMIN 3.9  3.6 3.8 4.0   No results for input(s): LIPASE, AMYLASE in the last 168 hours. No results for input(s): AMMONIA in the last 168 hours. CBC: Recent Labs  Lab 07/05/18 0955 07/06/18 0627 07/07/18 0343 07/08/18 0556 07/09/18 0408  WBC 5.0 4.3 4.9 8.9 8.3  NEUTROABS 3.7 2.7 3.5 8.0* 6.7  HGB 13.6 11.7* 12.5 12.6 12.6  HCT 42.7 38.5 40.0 40.7 41.0  MCV 85.7 86.9 86.0 85.1 86.1  PLT 161 141* 141* 162 171   Cardiac Enzymes: No results for input(s): CKTOTAL, CKMB, CKMBINDEX, TROPONINI in the last 168 hours. BNP: Invalid input(s): POCBNP CBG: No results for input(s): GLUCAP in the last 168 hours. D-Dimer No results for input(s): DDIMER in the last 72 hours. Hgb A1c No results for input(s): HGBA1C in the last 72 hours. Lipid Profile No results for input(s): CHOL, HDL, LDLCALC, TRIG, CHOLHDL, LDLDIRECT in the last 72 hours. Thyroid function studies No results for input(s): TSH, T4TOTAL, T3FREE, THYROIDAB in the last 72 hours.  Invalid input(s): FREET3 Anemia work up No results for input(s): VITAMINB12, FOLATE, FERRITIN, TIBC, IRON, RETICCTPCT in the last 72 hours. Urinalysis    Component Value Date/Time   COLORURINE YELLOW 07/01/2018 0628   APPEARANCEUR CLEAR 07/01/2018 0628   LABSPEC 1.023 07/01/2018 0628   PHURINE 5.0 07/01/2018 0628   GLUCOSEU NEGATIVE 07/01/2018 0628   HGBUR MODERATE (A) 07/01/2018 0628   BILIRUBINUR NEGATIVE 07/01/2018 0628   KETONESUR 5 (A) 07/01/2018 0628   PROTEINUR NEGATIVE 07/01/2018 0628   UROBILINOGEN 1.0 06/26/2010 1009   NITRITE NEGATIVE 07/01/2018 0628   LEUKOCYTESUR TRACE (A) 07/01/2018 0628   Sepsis Labs Invalid input(s): PROCALCITONIN,  WBC,  LACTICIDVEN Microbiology Recent Results (from the past 240 hour(s))  Blood culture (routine x 2)     Status: None   Collection Time: 07/01/18  8:15 AM  Result Value Ref Range Status   Specimen Description BLOOD LEFT WRIST  Final   Special Requests   Final    BOTTLES DRAWN AEROBIC AND ANAEROBIC  Blood Culture results may not be optimal due to an inadequate volume of blood received in culture bottles   Culture   Final    NO GROWTH 5 DAYS Performed at [facility], [place] [address], [place], [place] [address]    Report Status 07/06/2018 FINAL  Final  Blood culture (routine x 2)     Status: None   Collection Time: 07/01/18  8:30 AM  Result Value Ref Range Status   Specimen Description BLOOD RIGHT ANTECUBITAL  Final   Special Requests   Final    BOTTLES DRAWN AEROBIC AND ANAEROBIC Blood Culture adequate volume   Culture   Final    NO GROWTH 5 DAYS Performed at [facility], [place] [address], [place], [place] [address]    Report Status 07/06/2018 FINAL  Final  Surgical pcr screen     Status: None   Collection Time: 07/06/18 12:46 PM  Result Value Ref Range Status   MRSA, PCR NEGATIVE NEGATIVE Final   Staphylococcus aureus NEGATIVE NEGATIVE Final    Comment: (NOTE) The Xpert SA Assay (FDA approved for NASAL specimens in patients 2 years of age and older), is one component of a comprehensive surveillance program. It is not  intended to diagnose infection nor to guide or monitor treatment. Performed at [facility], [place] [address], [place], [place] [address]    Time coordinating discharge: 35 minutes  SIGNED:  Kerney Elbe, DO [facility] 07/09/2018, 4:56 PM Pager is on [place]  If 7PM-7AM, please contact night-coverage [email or web address] Password [email or web address]

## 2018-07-09 NOTE — Consult Note (Addendum)
NEURO HOSPITALIST CONSULT NOTE   Requestig physician: Dr. Alfredia Ferguson  Reason for Consult: hallucinations  History obtained from:  Patient  / family   HPI:                                                                                                                                          [name] is an 78 y.o. female  PMH of parkinson's, paroxysmal a. Fib (coumadin), HTN, HLD, CHF, GERD who presented to [facility] ED on 07/01/2018 for severe back pain. Neurology consulted for hallucinations.  Patient has been having hallucinations. She is seeing people at the window and other places in the room. Patient endorses that she has also not slept well for the past two days, and she has been in the hospital for a week.endorses that memory has been declining over the past year.    Patient is a patient of Wells Guiles Tat ( neurologist): last visit on 02/09/2018: Patient on carbidopa/levodopa ( sinemet) 25/100, 2 tablests 4 times per day and pramipexole 0.5mg  at night for RLS.    Past Medical History:  Diagnosis Date  . Allergic rhinitis   . Arthritis    "all over"  . CHF (congestive heart failure) ([place])   . Chronic back pain   . DJD (degenerative joint disease)   . GERD (gastroesophageal reflux disease)   . Hyperlipidemia   . Hypertension   . Lumbar stenosis   . OSA (obstructive sleep apnea)    "couldn't handle CPAP; use mouth guard some; not all the time" (01/06/2014)  . PAF (paroxysmal atrial fibrillation) ([place])   . Scoliosis    with radiculopathy L2-S1 with prior surgery  . Small bowel obstruction ([facility])    versus ileus after last bck surgery  . Spondylosis     Past Surgical History:  Procedure Laterality Date  . BACK SURGERY    . CATARACT EXTRACTION W/ INTRAOCULAR LENS  IMPLANT, BILATERAL  2011  . COLONOSCOPY  12/09/2004  . LIPOMA EXCISION  1980's   "fatty tumors"  . [place] SURGERY  02/2009   "ruptured disc"  . NASAL SEPTUM SURGERY  80's  . POSTERIOR  LUMBAR FUSION  06/2010; 10/2011   "placed screws, rods, spacers both times"  . REPAIR DURAL / CSF LEAK  02/2009  . TOTAL ABDOMINAL HYSTERECTOMY  02/1993    Family History  Problem Relation Age of Onset  . Arthritis Mother   . Heart attack Father   . Hypertension Sister   . Lung cancer Sister   . Asthma Sister   . Allergies Sister           Social History:  reports that she has never smoked. She has never used smokeless tobacco. She reports that she does not drink alcohol or use  drugs.  Allergies  Allergen Reactions  . Lyrica [Pregabalin] Other (See Comments)    Felt loopy  . Zocor [Simvastatin] Other (See Comments)    Myalgias  . Relafen [Nabumetone] Rash    MEDICATIONS:                                                                                                                     Scheduled: . acetaminophen  650 mg Oral Q6H  . acidophilus  1 capsule Oral Daily  . carbidopa-levodopa  2 tablet Oral TID  . chlorzoxazone  500 mg Oral BID  . dicyclomine  20 mg Oral TID AC  . enoxaparin (LOVENOX) injection  80 mg Subcutaneous Q12H  . flecainide  50 mg Oral BID  . gabapentin  300 mg Oral QHS  . latanoprost  1 drop Both Eyes QHS  . lidocaine  1 patch Transdermal Q24H  . LORazepam  1 mg Intravenous Once  . metoprolol succinate  100 mg Oral Daily  . pantoprazole  40 mg Oral BID  . polyethylene glycol  17 g Oral Daily  . potassium chloride SA  40 mEq Oral Daily  . pramipexole  0.5 mg Oral QHS  . pravastatin  40 mg Oral QHS  . senna  1 tablet Oral BID  . warfarin  2.5 mg Oral ONCE-1800  . Warfarin - Pharmacist Dosing Inpatient   Does not apply q1800   Continuous: . lactated ringers 10 mL/hr at 07/07/18 0850   JAS:NKNLZJQ carbonate, morphine injection, ondansetron **OR** ondansetron (ZOFRAN) IV, oxyCODONE, oxyCODONE, traZODone   ROS:                                                                                                                                       ROS was  performed and is negative except as noted in HPI    Blood pressure 108/77, pulse 72, temperature 98.5 F (36.9 C), temperature source Oral, resp. rate 14, height 5\' 5"  (1.651 m), weight 84 kg, SpO2 96 %.   General Examination:  Physical Exam  HEENT-  Normocephalic, no lesions, without obvious abnormality.  Normal external eye and conjunctiva.   Cardiovascular- S1-S2 audible, pulses palpable throughout   Lungs-no rhonchi or wheezing noted, no excessive working breathing.  Saturations within normal limits on RA Abdomen- All 4 quadrants palpated and nontender Extremities- Warm, dry and intact Musculoskeletal-no joint tenderness, deformity or swelling Skin-warm and dry, no hyperpigmentation, vitiligo, or suspicious lesions  Neurological Examination Mental Status: Alert, oriented, thought content appropriate.  Speech fluent without evidence of aphasia.  Able to follow  commands without difficulty. Cranial Nerves: II: ; Visual fields grossly normal,  III,IV, VI: ptosis not present, extra-ocular motions intact bilaterally pupils equal, round, reactive to light and accommodation V,VII: smile symmetric, facial light touch sensation normal bilaterally VIII: hearing normal bilaterally IX,X: uvula rises symmetrically XI: bilateral shoulder shrug XII: midline tongue extension Motor: Right : Upper extremity   5/5  Left:     Upper extremity   5/5  Lower extremity   5/5   Lower extremity   5/5 Slight Cog wheel rigidity noted in left arm, but none in right.  Tone and bulk:normal tone throughout; no atrophy noted Sensory:  light touch intact throughout, bilaterally Gait: deferred   Lab Results: Basic Metabolic Panel: Recent Labs  Lab 07/05/18 0955 07/06/18 0627 07/07/18 0343 07/08/18 0556 07/08/18 1330 07/09/18 0408  NA 140 143 139 143 143  --   K 4.2 4.0 4.2 3.9 4.1  --   CL 107 111  107 107 109  --   CO2 26 25 24 24 22   --   GLUCOSE 97 84 90 137* 101*  --   BUN 23 24* 24* 14 20  --   CREATININE 0.92 0.98 0.98 0.89 1.07*  --   CALCIUM 9.6 9.0 9.4 9.9 9.5  --   MG 2.0 1.9 1.8 1.8  --  1.9  PHOS 3.2 3.6 3.6 3.6  --  3.0    CBC: Recent Labs  Lab 07/05/18 0955 07/06/18 0627 07/07/18 0343 07/08/18 0556 07/09/18 0408  WBC 5.0 4.3 4.9 8.9 8.3  NEUTROABS 3.7 2.7 3.5 8.0* 6.7  HGB 13.6 11.7* 12.5 12.6 12.6  HCT 42.7 38.5 40.0 40.7 41.0  MCV 85.7 86.9 86.0 85.1 86.1  PLT 161 141* 141* 162 171   Assessment: PMH of parkinson's, paroxysmal a. Fib (coumadin), HTN, HLD, CHF, GERD who presented to [facility] ED on 07/01/2018 for severe back pain. Neurology consulted for hallucinations. Hallucinations are most likely a side effect of sinemet. Will decrease sinemet from 4 times daily to 3 times daily. Also discussed with family that remaining in the hospital is not good for hallucinations or the parkinson's. Family in agreement with plan of care and will f/u with outpatient neurology.   Impression: Sinemet induced hallucinations  Recommendations: - decrease sinemet to 25/100 3 times per day. --f/u with Dr. Carles Collet 1 week after discharge. -- neurology to sign off at this time. Please call with any further concerns.  Laurey Morale, MSN, NP-C [facility] [phone]   Attending neurologist's note to follow  Attending Neurohospitalist Addendum Patient seen and examined with APP/Resident. Agree with the history and physical as documented above. Agree with the plan as documented, which I helped formulate. I have independently reviewed the chart, obtained history, review of systems and examined the patient.I have personally reviewed pertinent head/neck/spine imaging (CT/MRI). Please feel free to call with any questions. --- Amie Portland, MD [facility] Pager: [phone]  If 7pm to 7am, please call  on call as listed on AMION.    07/09/2018, 4:07  PM

## 2018-07-09 NOTE — Progress Notes (Signed)
Pt noted to be speaking to a towel like it is a human at this time.  Pt's family concerned about today's discharge and if pt would benefit from staying another day to "clear up mentally" before they attempt to take her home.  Per pt's daughter, pt's husband has dementia and all other caregivers work during the day.  Call placed to notify MD.  Lavone Nian

## 2018-07-09 NOTE — Progress Notes (Addendum)
PROGRESS NOTE    [name]  WLN:[id number] DOB: August 09, 1940 DOA: 07/01/2018 PCP: [name], MD   Brief Narrative:  [name] is a 78 y.o.femalewith medical history significant ofParkinson's disease, congestive heart failure with preserved ejection fraction, chronic back pain, degenerative joint disease, gastroesophageal reflux disease, hyperlipidemia, hypertension and paroxysmal atrial fibrillation on warfarin who presents for evaluation of worsening onset of progressively worsening low back pain and abdominal pain which has been present for at least 3 months.   Thoracolumbar MRI> several indicators of discitis-osteomyelitis at the T12-L1 level. The patient was started on Vanc and Cefepime and admitted. Further workup deemed that she likely did not have a discitis/Osteomyelitis and Abx were D/C'd. Patient was going to be D/C'd but continued to complain of Abdominal Pain so GI was consulted and a HIDA Scan was done which showed a low EF. General Surgery was consulted and plan on taking the patient for a cholecystectomy Friday. Cardiology was consulted for preoperative Clearance and she has been cleared for Surgery.   She underwent Surgical intervention yesterday and had a Laparoscopic Cholecystectomy. She was doing well and Surgery recommended resuming Coumadin and advanced diet. Unfortunately she started choking when she started eating so she was made NPO and will get SLP involved. Coumadin resumed yesterday and will change to a Lovenox bridge instead of Heparin gtt.  Hospitalization has been complicated by Delirium/Confusion with patient hallucinating. Neurology consulted for further evaluation and recommendations.   Assessment & Plan:   Principal Problem:   Discitis of thoracolumbar region Active Problems:   Osteoarthritis   Obstructive sleep apnea   Atrial fibrillation with RVR (HCC)   Chronic anticoagulation -warfarin therapy   Parkinsonism (HCC)  Preoperative cardiovascular examination  Chronic Back Pain Discitis of thoracolumbar region? -S/p multiple back surgeries by neuro surgery- pain appears to chronic ? Discitison MRI- patient is not tender in the area of supposed discitis- Dr. Wynelle Cleveland discontinued the antibiotics andasked for a Neurosurgeryand ID opinion - the conclusion is that it is not likely she has a discitis and at this time does not need further work up  -In regards to her chronic back-she states she was prescribed PT which exacerbated thisbut family concerned and requesting Home Health so PT re-consulted and recommending Home Health PT - She states the pain moves around and there is no radiation down her legs and no other neurological symptoms -There is moderate thoracic stenosis on the MRIwhich Dr. Wynelle Cleveland discussed with NS-NS does not feel it needs surgery or further inpatient management  -It appears to be muscular pain and she was started on Voltaren Gel -C/w Oxycodone IR 10 mg q4hprn Severe Pain and 5 mg po q4hprn for now  Abdominal Bloating and Pain when eating, improving  Biliary Dyskinesia s/o Laparscopic Cholecystectomy POD2 -GI consulted- her pain did not seem consistent with gallbladder disease but a HIDA scan was ordered- it showed a low EF; Some symptoms may be attributable to the Gallbladder but certainly not all -GI discussed with the patient that her pain may persist even with the removal of the gallbladder -General Surgery consulted and undewent Cholecystectomy 07/07/2018 and is doing well -Diet was advanced and patient stated that her abdominal pain had improved and just felt "sore" from the surgery -Unfortunately she started choking on Diet she was put on so she was made NPO and SLP to evaluate but has passed SLP and now on Heart Healthy Diet  -C/w Pantoprazole 40 mg po BID  -Cardiology Consulted for Pre-operative Clearance  and she was cleared     Obstructive Sleep Apnea -Does not use  CPAP  Paroxysmal A Fib  -Cont Flecainide 50 mg po BID  -Coumadin per pharmacy was stopped and changed to IV Heparin for Surgery; Remained on IV Heparin gtt for bridging but will change to Lovenox Bridge today in anticipation of D/C  -Cardiology consulted for Pre-operative Clearance and cleared patient  -Resumed Coumadin post-operatively -PT was 15.6 and INR was 1.25 -Will likely send out on Lovenox injections in the AM  -Will need to follow up with Dr. 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 Carles Collet at D/C   Obesity -Estimated body mass index is 30.82 kg/m as calculated from the following:   Height as of this encounter: 5\' 5"  (1.651 m).   Weight as of this encounter: 84 kg. -Weight Loss Counseling given   HLD -C/w Pravastatin 40 mg po qHS  HTN -C/w Metoprolol Succinate 100 mg po Daily  -Continue to Monitor Blood Pressures carefully  Normocytic Anemia -Patient's Hb/Hct stable and now 12.6/41.0 -Continue to Monitor for S/Sx of Bleeding as patient is on Anticoagulation -Repeat CBC in AM   Dysphagia/Choking, improved  -Choked on the diet today after it was advanced by Surgery and had difficulty swallowing -Made NPO and SLP to Evaluate -Follow Diet Recommendations per SLP evaluation and they recommend Heart Healthy Diet   Hallucinations -Worsened in the hospital setting -? Worsening Parkinson's Dementia vs. Hospital Delirium -Will place on  Delirium Precautions  -Will obtain MRI of the Brain w/o Contrast -Will decrease Sinemet Dose based on Neurology Recommendations -Neurology will consult formally and evaluate sometime today   Renal Insuffiencey -Mild. 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 -BUN/Cr went from 23/0.92 -> 24/0.98 -> 14/0.89 -> 20/1.07 -Avoid Nephrotoxic Medications and IV Contrast Dyes if possible -Continue to Monitor and Trend Renal Fxn -Repeat CMP in AM   DVT prophylaxis: Anticoagulated with Coumadin with Lovenox Bridging  Code Status: FULL CODE  Family Communication: No family present Disposition Plan: Home Health PT when medically stable and evaluated by Neurology   Consultants:   Cardiology  Gastroenterology  General Surgery  Neurosurgery  Infectious Diseases  Neurology   Procedures: HIDA Scan; MRI brain w/o Contrast    Antimicrobials:  Anti-infectives (From admission, onward)   Start     Dose/Rate Route Frequency Ordered Stop   07/07/18 0600  ceFAZolin (ANCEF) IVPB 2g/100 mL premix     2 g 200 mL/hr over 30 Minutes Intravenous  Once 07/06/18 0920 07/07/18 0532   07/03/18 0400  vancomycin (VANCOCIN) 1,500 mg in sodium chloride 0.9 % 500 mL IVPB  Status:  Discontinued     1,500 mg 250 mL/hr over 120 Minutes Intravenous Every 36 hours 07/01/18 1540 07/02/18 0912   07/01/18 1545  ceFEPIme (MAXIPIME) 1 g in sodium chloride 0.9 % 100 mL IVPB  Status:  Discontinued     1 g 200 mL/hr over 30 Minutes Intravenous Every 12 hours 07/01/18 1540  07/02/18 0912   07/01/18 1545  vancomycin (VANCOCIN) 1,750 mg in sodium chloride 0.9 % 500 mL IVPB     1,750 mg 250 mL/hr over 120 Minutes Intravenous  Once 07/01/18 1540 07/02/18 1820     Subjective: Seen and examined today he was doing well postoperatively.  States her abdomen was still a little sore.  No chest pain, lightheadedness or dizziness.  Understands she needs to take small bites.  Daughter is concerned about her confusion and patient then started  having visual hallucinations later this afternoon.  Likely in the setting of hospital delirium versus worsening Parkinson's dementia.  Neurology will formally be consulted and I discussed the case with Dr. Rory Percy who recommends seeing the patient Sinemet and obtain an MRI of the brain without contrast. Lovenox will be started for Bridging.  Objective: Vitals:   07/08/18 1743 07/08/18 1934 07/09/18 0411 07/09/18 0953  BP: (!) 129/53 (!) 116/44 (!) 165/73 108/77  Pulse: 75 62 70 72  Resp:  14  14  Temp: 98.1 F (36.7 C) 97.6 F (36.4 C) 98.2 F (36.8 C) 98.5 F (36.9 C)  TempSrc: Oral Oral Oral Oral  SpO2: 97% 97% 99% 96%  Weight:      Height:        Intake/Output Summary (Last 24 hours) at 07/09/2018 1556 Last data filed at 07/09/2018 0725 Gross per 24 hour  Intake 1050.07 ml  Output 1175 ml  Net -124.93 ml   Filed Weights   07/01/18 1800 07/01/18 2300  Weight: 84.8 kg 84 kg   Examination: Physical Exam:  Constitutional: Well-nourished, well-developed obese Caucasian female currently no acute distress and sitting in a chair about to eat breakfast appears calm and comfortable but she is a little confused and does not know where she is but she is alert and oriented to person, situation, and knows the president, the month and the year. Eyes: Conjunctive are normal.  Sclera anicteric ENMT: External ears and nose appear normal.  Grossly normal hearing Neck: Appears supple with no JVD Respiratory: Slightly diminished to auscultation bilaterally no appreciable wheezing, rales, rhonchi.  Patient not tachypneic wheezing and accessory muscles to breathe Cardiovascular: Regular rate and rhythm.  No appreciable murmurs, rubs, gallops. Abdomen: Soft, mildly tender to palpate.  Distended secondary body habitus.  Bowel sounds present GU: Deferred Musculoskeletal: No contractures or cyanosis.  No joint formerly is noted Skin: Skin is warm and dry no appreciable rashes or lesions limited skin  evaluation.  Abdominal incisions appear clean dry and intact Neurologic: Cranial nerves II through XII grossly intact no appreciable focal deficits Psychiatric: She is awake and alert and only oriented x2.  She does not know where she is.  Has a normal mood and affect.  Subsequently she became confused and started hallucinating later on this afternoon  Data Reviewed: I have personally reviewed following labs and imaging studies  CBC: Recent Labs  Lab 07/05/18 0955 07/06/18 0627 07/07/18 0343 07/08/18 0556 07/09/18 0408  WBC 5.0 4.3 4.9 8.9 8.3  NEUTROABS 3.7 2.7 3.5 8.0* 6.7  HGB 13.6 11.7* 12.5 12.6 12.6  HCT 42.7 38.5 40.0 40.7 41.0  MCV 85.7 86.9 86.0 85.1 86.1  PLT 161 141* 141* 162 175   Basic Metabolic Panel: Recent Labs  Lab 07/05/18 0955 07/06/18 0627 07/07/18 0343 07/08/18 0556 07/08/18 1330 07/09/18 0408  NA 140 143 139 143 143  --   K 4.2 4.0 4.2 3.9 4.1  --   CL 107 111 107 107 109  --  CO2 26 25 24 24 22   --   GLUCOSE 97 84 90 137* 101*  --   BUN 23 24* 24* 14 20  --   CREATININE 0.92 0.98 0.98 0.89 1.07*  --   CALCIUM 9.6 9.0 9.4 9.9 9.5  --   MG 2.0 1.9 1.8 1.8  --  1.9  PHOS 3.2 3.6 3.6 3.6  --  3.0   GFR: Estimated Creatinine Clearance: 48.6 mL/min (A) (by C-G formula based on SCr of 1.07 mg/dL (H)). Liver Function Tests: Recent Labs  Lab 07/05/18 0955 07/06/18 0627 07/07/18 0343 07/08/18 0556  AST 18 23 19 28   ALT 5 7 6 7   ALKPHOS 93 82 87 89  BILITOT 0.7 0.6 0.3 0.6  PROT 7.2 6.5 6.7 7.0  ALBUMIN 3.9 3.6 3.8 4.0   No results for input(s): LIPASE, AMYLASE in the last 168 hours. No results for input(s): AMMONIA in the last 168 hours. Coagulation Profile: Recent Labs  Lab 07/05/18 0351 07/06/18 0627 07/07/18 0343 07/08/18 0556 07/09/18 0408  INR 1.27 1.35 1.20 1.16 1.25   Cardiac Enzymes: No results for input(s): CKTOTAL, CKMB, CKMBINDEX, TROPONINI in the last 168 hours. BNP (last 3 results) No results for input(s): PROBNP in  the last 8760 hours. HbA1C: No results for input(s): HGBA1C in the last 72 hours. CBG: No results for input(s): GLUCAP in the last 168 hours. Lipid Profile: No results for input(s): CHOL, HDL, LDLCALC, TRIG, CHOLHDL, LDLDIRECT in the last 72 hours. Thyroid Function Tests: No results for input(s): TSH, T4TOTAL, FREET4, T3FREE, THYROIDAB in the last 72 hours. Anemia Panel: No results for input(s): VITAMINB12, FOLATE, FERRITIN, TIBC, IRON, RETICCTPCT in the last 72 hours. Sepsis Labs: No results for input(s): PROCALCITON, LATICACIDVEN in the last 168 hours.  Recent Results (from the past 240 hour(s))  Blood culture (routine x 2)     Status: None   Collection Time: 07/01/18  8:15 AM  Result Value Ref Range Status   Specimen Description BLOOD LEFT WRIST  Final   Special Requests   Final    BOTTLES DRAWN AEROBIC AND ANAEROBIC Blood Culture results may not be optimal due to an inadequate volume of blood received in culture bottles   Culture   Final    NO GROWTH 5 DAYS Performed at [facility], [place] [address], [place], [place] [address]    Report Status 07/06/2018 FINAL  Final  Blood culture (routine x 2)     Status: None   Collection Time: 07/01/18  8:30 AM  Result Value Ref Range Status   Specimen Description BLOOD RIGHT ANTECUBITAL  Final   Special Requests   Final    BOTTLES DRAWN AEROBIC AND ANAEROBIC Blood Culture adequate volume   Culture   Final    NO GROWTH 5 DAYS Performed at [facility], [place] [address] [place], [place] [address]    Report Status 07/06/2018 FINAL  Final  Surgical pcr screen     Status: None   Collection Time: 07/06/18 12:46 PM  Result Value Ref Range Status   MRSA, PCR NEGATIVE NEGATIVE Final   Staphylococcus aureus NEGATIVE NEGATIVE Final    Comment: (NOTE) The Xpert SA Assay (FDA approved for NASAL specimens in patients 56 years of age and older), is one component of a comprehensive surveillance program. It is not intended to diagnose  infection nor to guide or monitor treatment. Performed at [facility], [place] [address] [place], [place] [address]  Radiology Studies: No results found. Scheduled Meds: . acetaminophen  650 mg Oral Q6H  . acidophilus  1 capsule Oral Daily  . carbidopa-levodopa  2 tablet Oral QID  . chlorzoxazone  500 mg Oral BID  . dicyclomine  20 mg Oral TID AC  . flecainide  50 mg Oral BID  . gabapentin  300 mg Oral QHS  . latanoprost  1 drop Both Eyes QHS  . lidocaine  1 patch Transdermal Q24H  . LORazepam  1 mg Intravenous Once  . metoprolol succinate  100 mg Oral Daily  . pantoprazole  40 mg Oral BID  . polyethylene glycol  17 g Oral Daily  . potassium chloride SA  40 mEq Oral Daily  . pramipexole  0.5 mg Oral QHS  . pravastatin  40 mg Oral QHS  . senna  1 tablet Oral BID  . warfarin  2.5 mg Oral ONCE-1800  . Warfarin - Pharmacist Dosing Inpatient   Does not apply q1800   Continuous Infusions: . heparin 750 Units/hr (07/09/18 0757)  . lactated ringers 10 mL/hr at 07/07/18 0850    LOS: 8 days    Kerney Elbe, DO [facility] PAGER is on [place]  If 7PM-7AM, please contact night-coverage [email or web address] Password [facility] 07/09/2018, 3:56 PM

## 2018-07-09 NOTE — Progress Notes (Signed)
ANTICOAGULATION CONSULT NOTE  Pharmacy Consult: Heparin switch to SQ Lovenox bridge to Warfarin Indication: atrial fibrillation  Allergies  Allergen Reactions  . Lyrica [Pregabalin] Other (See Comments)    Felt loopy  . Zocor [Simvastatin] Other (See Comments)    Myalgias  . Relafen [Nabumetone] Rash    Patient Measurements: Height: 5\' 5"  (165.1 cm) Weight: 185 lb 3 oz (84 kg) IBW/kg (Calculated) : 57  Heparin dosing weight = 84 kg  Vital Signs: Temp: 98.5 F (36.9 C) (02/02 0953) Temp Source: Oral (02/02 0953) BP: 108/77 (02/02 0953) Pulse Rate: 72 (02/02 0953)  Labs: Recent Labs    07/07/18 0343 07/08/18 0556 07/08/18 1125 07/08/18 1330 07/09/18 0408 07/09/18 1316  HGB 12.5 12.6  --   --  12.6  --   HCT 40.0 40.7  --   --  41.0  --   PLT 141* 162  --   --  171  --   LABPROT 15.1 14.7  --   --  15.6*  --   INR 1.20 1.16  --   --  1.25  --   HEPARINUNFRC  --  0.55 0.52  --  0.72* 0.58  CREATININE 0.98 0.89  --  1.07*  --   --     Estimated Creatinine Clearance: 48.6 mL/min (A) (by C-G formula based on SCr of 1.07 mg/dL (H)).   Assessment: 12 YOF with history of Afib on Coumadin PTA.  Patient presented with worsening low back pain and abdominal pain.  Pharmacy initially consulted to dose IV heparin while Coumadin is on hold for surgery. Now POD #2 s/p lap cholecystectomy.  Coumadin resumed post op.   PTA warfarin dose:  *dose per pt: 2.5 mg daily except none on Tues/Thurs *dose per clinic: 2.5mg  daily except 1.25mg  on Tues/Thurs   MD called pharmacist and wants to switch from Heparin IV to SQ Lovenox bridge to Warfarin , with plan  to discharge home today.  Plan for INR check tomorrow at PCP/coumadin clinic.  Current heparin level was therapeutic on heparin drip rate 750 units/hr.  Will stop heparin now, and begin Lovenox SQ.    No bleeding reported  Goal of Therapy:  Heparin level 0.3-0.7 units/mL INR 2-3 Monitor platelets by anticoagulation protocol:  Yes   Plan:  DC heparin gtt now, then 1 hr later give Lovenox 80 mg SQ q12h.  MD plans to resume Warfarin pta dose- notified MD that clinic note indicates warfarin dose is  2.5mg  daily except 1.25mg  on Tues/Thurs INR at clinic/PCP tomorrow 2/3. I have corrected the PTA medication list to reflect [facility] clinic dose recommendation.  MD has spoken to case mngr yesterday and pt has insurance coverage for lovenox, to fill rx at her pharmacy as [facility] is closed on weekends.  Nicole Cella, RPh Clinical Pharmacist [phone] Please check AMION for all [place] phone numbers After 10:00 PM, call [place] [phone] 07/09/2018 4:11 PM

## 2018-07-09 NOTE — Care Management Note (Signed)
Case Management Note  Patient Details  Name: [name] MRN: [id number] Date of Birth: Feb 24, 1941  Subjective/Objective:       Pt presented for back pain and had lap chole 1/31. Pt to d/c home with [facility] services including PT, OT, RN, and NA.  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  Adult daughters at bedside.  They are concerned because they both work full-time and feel the patient and husband need more help at home for the immediate future.  Discussed home health vs. Personal care assistance.   Pt has RW, 3n1, and all other necessary DME at home.   They have used [facility] for HH needs in the past.       Action/Plan: Medicare list presented to patient and daughters. [facility].  Referral called to [facility] with [facility].  He will also assist family with personal care services.    Expected Discharge Date:  07/09/18               Expected Discharge Plan:  [place]  In-House Referral:  NA  Discharge planning Services  CM Consult  Post Acute Care Choice:  Home Health Choice offered to:  Patient, Adult Children  DME Arranged:  N/A DME Agency:     HH Arranged:  RN, PT, OT, Nurse's Aide [place] Agency:  [place]  Status of Service:  Completed, signed off  If discussed at [place] of Stay Meetings, dates discussed:    Additional Comments:  Claudie Leach, RN 07/09/2018, 5:13 PM

## 2018-07-11 ENCOUNTER — Encounter (HOSPITAL_COMMUNITY): Payer: Self-pay | Admitting: Surgery

## 2018-07-11 ENCOUNTER — Ambulatory Visit: Payer: Medicare Other | Admitting: Rehabilitation

## 2018-07-11 DIAGNOSIS — R1310 Dysphagia, unspecified: Secondary | ICD-10-CM | POA: Diagnosis not present

## 2018-07-11 DIAGNOSIS — Z48815 Encounter for surgical aftercare following surgery on the digestive system: Secondary | ICD-10-CM | POA: Diagnosis not present

## 2018-07-11 DIAGNOSIS — I5030 Unspecified diastolic (congestive) heart failure: Secondary | ICD-10-CM | POA: Diagnosis not present

## 2018-07-11 DIAGNOSIS — G20 Parkinson's disease: Secondary | ICD-10-CM | POA: Diagnosis not present

## 2018-07-11 DIAGNOSIS — I130 Hypertensive heart and chronic kidney disease with heart failure and stage 1 through stage 4 chronic kidney disease, or unspecified chronic kidney disease: Secondary | ICD-10-CM | POA: Diagnosis not present

## 2018-07-11 DIAGNOSIS — G2581 Restless legs syndrome: Secondary | ICD-10-CM | POA: Diagnosis not present

## 2018-07-11 DIAGNOSIS — M1990 Unspecified osteoarthritis, unspecified site: Secondary | ICD-10-CM | POA: Diagnosis not present

## 2018-07-11 DIAGNOSIS — R7309 Other abnormal glucose: Secondary | ICD-10-CM | POA: Diagnosis not present

## 2018-07-11 DIAGNOSIS — G8929 Other chronic pain: Secondary | ICD-10-CM | POA: Diagnosis not present

## 2018-07-11 DIAGNOSIS — M4804 Spinal stenosis, thoracic region: Secondary | ICD-10-CM | POA: Diagnosis not present

## 2018-07-11 DIAGNOSIS — G629 Polyneuropathy, unspecified: Secondary | ICD-10-CM | POA: Diagnosis not present

## 2018-07-11 DIAGNOSIS — G4733 Obstructive sleep apnea (adult) (pediatric): Secondary | ICD-10-CM | POA: Diagnosis not present

## 2018-07-11 DIAGNOSIS — K5790 Diverticulosis of intestine, part unspecified, without perforation or abscess without bleeding: Secondary | ICD-10-CM | POA: Diagnosis not present

## 2018-07-11 DIAGNOSIS — D696 Thrombocytopenia, unspecified: Secondary | ICD-10-CM | POA: Diagnosis not present

## 2018-07-11 DIAGNOSIS — I480 Paroxysmal atrial fibrillation: Secondary | ICD-10-CM | POA: Diagnosis not present

## 2018-07-11 DIAGNOSIS — N183 Chronic kidney disease, stage 3 (moderate): Secondary | ICD-10-CM | POA: Diagnosis not present

## 2018-07-12 ENCOUNTER — Ambulatory Visit: Payer: Medicare Other | Admitting: *Deleted

## 2018-07-12 DIAGNOSIS — Z5181 Encounter for therapeutic drug level monitoring: Secondary | ICD-10-CM | POA: Diagnosis not present

## 2018-07-12 DIAGNOSIS — I4891 Unspecified atrial fibrillation: Secondary | ICD-10-CM

## 2018-07-12 LAB — POCT INR: INR: 1.3 — AB (ref 2.0–3.0)

## 2018-07-12 NOTE — Progress Notes (Signed)
[name] was seen today in the movement disorders clinic for neurologic consultation at the request of [name].  Her PCP is [name], MD.  The consultation is for the evaluation of PD.  This patient is accompanied in the office by her spouse who supplements the history.  I have reviewed prior records made available to me.  Extensive record review was done on 04/15/2016 and took 45 minutes.  The patient has seen multiple physicians at [facility] neurology, including Dr. [name] (only for EMG), Dr. [name], Dr. [name] and Dr. [name].  The patient was first seen by Dr. [name] in 2014 and subsequently had an EMG by Dr. [name].  The primary complaint at that point was knee pain and she was diagnosed with a painful peripheral neuropathy.  In June, 2015 she followed up with Dr. [name] and was complaining about dizziness and gait instability.  Dr. [name] felt that perhaps this was due to cervical degenerative changes.  When she came back for follow-up, Dr. [name] had left the practice and Dr. [name] had resumed her care.  Dr. [name] felt that her symptoms were consistent with parkinsonism and she was started on levodopa in December, 2015.  She was started on half a tablet 3 times per day.  Initial follow-up notes more month later indicate that the patient thought that the medication was helpful.  In February, 2016 she was started on Azilect but it was expensive and she thought that it caused swelling and so pt d/c it.  However, once she d/c she thought that she got worse so she restarted it.  In July, 2016 it was attempted to discontinue the levodopa and try her on Neupro patch.  The patient felt that she did worse with this and she stopped patch.  The patient saw Dr. [name] in May, 2017 for a second opinion.  Dr. [name] stated "she does not give any sinister atypical history for atypical parkinsonism but does not have a very strong history or telltale exam for idiopathic Parkinson's disease either.  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   Specific Symptoms:  Tremor: No. Family hx of similar:  No. Voice: patients husband thinks that voice is same; pt thinks that it is deeper Sleep: patient states that she doesn't sleep well (but states that it is because of husbands electric blanket makes her hot)  Vivid Dreams:  Yes.    Acting out dreams:  Yes.  , screams out x several years but no falling out of the bed Wet Pillows: Yes.   Postural symptoms:  Yes.   x 3-4 years and thinks that perhaps due to back surgeries.  Used walker x several years  Falls? Only one time in march 2017 - trying to put something on bed and fell; few other times bent down and "eased down" to floor (for example at file cabinet) Bradykinesia symptoms: slow movements, difficulty getting out of a chair and small steps Loss of smell:  No. Loss of taste:  No. Urinary Incontinence:  Yes.   x 1 year (cannot get to bathroom fast enough) Difficulty Swallowing:  :not really" Handwriting, micrographia: Yes.   Trouble with ADL's:  Yes.   but better than she could (trouble with socks)  Trouble buttoning clothing: No. Depression:  No. Memory changes:  No. Hallucinations:  No.  visual distortions:  No., but does have some floaters and sees Dr. [name] N/V:  No. Lightheaded:  No.  Syncope: No. Diplopia:  Yes.   x 3-4 months (horizontal, goes away if closes eye, comes and goes, doesn't know if time of day, doesn't know if better if after nap; sometimes gets told to open L eye wider but not sure if has ptosis) Dyskinesia:  No.  Neuroimaging has previously been performed.  It is available for my review today.  I reviewed the patient's MRI of the brain dated 11/28/2013.  There was mild atrophy and a few T2 hyperintensities.  05/11/16 update:  Pt presents for levodopa challenge test.   She has been off of levodopa since 4:30 pm.  She has also held her pramipexole, 0.5 mg q hs that she takes for RLS.  Her legs are burning.  Her legs feel restless.  She doesn't feel more stiff off of medication.  Her main issues off of drug were at night (RLS).    09/17/16 update:  Patient presents today.  She is on carbidopa/levodopa 25/100, 2 tablets 4 times per day.  She is also on pramipexole 0.5 mg at night.  She did go to physical therapy since our last visit.  Those notes were reviewed.  She had a modified barium swallow since our last visit that was normal.  Pt denies falls.  Pt denies lightheadedness, near syncope.  Husband states that she talks in her sleep.  One day she thought she had a hallucination and "something" ran in front of her but never recurred.  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  This patient is accompanied in the office by her her husband who supplements the history.  She is on carbidopa/levodopa 25/100, 2 po qid. She is also on pramipexole 0.5 mg at night, primarily for restless leg syndrome.  States that a few times a week, she gets up in the middle of the night and takes an extra.  The records that were made available to me were reviewed.  Pt denies falls.  Pt denies lightheadedness, near syncope.  No hallucinations.  Mood is "fair" until her husband "gets upset."  States that her husband was dx with mild dementia and she is having to take care of him.  admts she is not using her walker/cane faithfully.  No diplopia.    05/24/17 update: Patient seen in follow-up for parkinsonism.  She is accompanied by her husband who supplements the history.  She is on carbidopa/levodopa 25/100, 2 tablets 4 times per day.  She is also on pramipexole 0.5 mg at night.  She has a rare hallucination of something running across the floor but it doesn't happen daily.  No lightheadedness  or near syncope.  Her mood has been fair (stable).  She has had no falls since last visit.  She has been back to see Dr. Annamaria Boots since our last visit and I reviewed his records.  States that her PCP ordered her to start gabapentin 300 mg q day and she is waiting for it from the mail order pharmacy.  It is for feet paresthesias.  States that her husband had C.diff following diverticulitis and she wonders if she didn't have c.diff but she is better now.  She states that she occasionally will forget her cane places but "that's how I know I am getting better."  She also recently  got out of the bathtub by herself which is an improvement.  09/06/17 update: The patient is seen today in follow-up.  She is accompanied by her husband who supplements the history.  The patient is on carbidopa/levodopa 25/100, 2 tablets 4 times per day (last taken at 8am and seen at 1pm.  Forgot her noon dosage)   and pramipexole 0.5 mg at night, which is primarily for restless leg.  She has had one "tumble" where she was leaning to pick up something and fell on the floor.  Didn't get hurt.  No lightheadedness or near syncope.  No hallucinations.  Mood is usually good.   Records have been reviewed since last visit.  Appears that she did call to her ID physician about diarrhea and was given a stool kit.  Her husband had c.diff and she thinks that she got it from him.  She got vancomycin.  She is planning on going to [place] in a few weeks.    02/09/18 update: Patient seen today in follow-up for parkinsonism.  She is accompanied by her husband who supplements the history.  She is on carbidopa/levodopa 25/100, 2 tablets 4 times per day and pramipexole 0.5 mg at night for restless leg.  She denies lightheadedness or near syncope.  She has rare intermittent diplopia. She did hurt her back cleaning windows.  This has limited her exercise.   Records are reviewed since last visit.  She saw [name] on Nov 02, 2017.  She is on Coumadin for paroxysmal A. fib.   No changes were made last visit.  Going to see Dr. Cristina Gong next week for bloating and abdominal pain.  She doesn't think that she is constipated as on a probiotic that helps with constipation.    07/14/18 update:  Pt seen in f/u for parkinsonism.  This patient is accompanied in the office by her spouse who supplements the history.  On carbidopa/levodopa 25/100, 2 po tid (was qid when I last saw her) and pramipexole 0.5 mg q hs, for RLS. Pt denies falls.  Pt denies lightheadedness, near syncope.    The records that were made available to me were reviewed.  Recently in the hospital for back pain that was initially felt due to discitis at the T12-L1 region (suggested by MRI) but later felt that was not the case and abx were d/c by ID.  She did have abdominal pain during the hospitalization and ultimately had a cholecystectomy during her stay.  During that stay, she developed hallucinations and they decreased her levodopa from 2 po qid to 2 po tid as they felt she had "sinemet induced hallucinations."  Note that the patient was being given oxycodone IR for the back pain.  No hallucinations.  She has only has been home for 4 days.  She is having some home PT next week.  She reports that she is still having abdominal pain.  She is still having back pain.  Back pain location fluctuates.  It is sometimes in the low back.  It is sometimes in the gluteal region.  It sometimes is in the left and sometimes is in the right.  PREVIOUS MEDICATIONS: Sinemet, Mirapex and neupro  ALLERGIES:   Allergies  Allergen Reactions  . Lyrica [Pregabalin] Other (See Comments)    Felt loopy  . Zocor [Simvastatin] Other (See Comments)    Myalgias  . Relafen [Nabumetone] Rash    CURRENT MEDICATIONS:  Outpatient Encounter Medications as of 07/14/2018  Medication Sig  . acetaminophen (TYLENOL)  325 MG tablet Take 325 mg by mouth daily as needed for mild pain (pain).   . calcium carbonate (TUMS - DOSED IN MG ELEMENTAL CALCIUM) 500 MG  chewable tablet Chew 1 tablet by mouth as needed for indigestion or heartburn.   . carbidopa-levodopa (SINEMET IR) 25-100 MG tablet Take 2 tablets by mouth 3 (three) times daily.  . flecainide (TAMBOCOR) 50 MG tablet TAKE 1 TABLET BY MOUTH 2 TIMES DAILY (Patient taking differently: Take 50 mg by mouth 2 (two) times daily. )  . furosemide (LASIX) 20 MG tablet Take 40 mg by mouth as needed.   . gabapentin (NEURONTIN) 300 MG capsule Take 300 mg by mouth at bedtime.  . lidocaine (LIDODERM) 5 % Place 1-2 patches onto the skin every 12 (twelve) hours for 30 days. Remove & Discard patch within 12 hours or as directed by MD  . metoprolol succinate (TOPROL-XL) 100 MG 24 hr tablet TAKE 1 TABLET BY MOUTH DAILY (Patient taking differently: Take 100 mg by mouth daily. )  . oxyCODONE-acetaminophen (PERCOCET/ROXICET) 5-325 MG tablet Take 1-2 tablets by mouth every 6 (six) hours as needed.  . pantoprazole (PROTONIX) 40 MG tablet Take 40 mg by mouth 2 (two) times daily.  . polyethylene glycol (MIRALAX / GLYCOLAX) packet Take 17 g by mouth daily.  . potassium chloride SA (K-DUR,KLOR-CON) 20 MEQ tablet Take 40 mEq by mouth daily.   . pramipexole (MIRAPEX) 0.5 MG tablet Take 0.5-1 mg by mouth at bedtime. For restless legs  . pravastatin (PRAVACHOL) 40 MG tablet Take 40 mg by mouth at bedtime.   . Probiotic Product (PROBIOTIC-10 PO) Take by mouth daily.  . TRAVATAN Z 0.004 % SOLN ophthalmic solution Place 1 drop into both eyes at bedtime.   Marland Kitchen warfarin (COUMADIN) 2.5 MG tablet Take 1 tablet (2.5 mg total) by mouth as directed. (Patient taking differently: Take 2.5 mg by mouth See admin instructions. per anticoag clinic visit 06/28/18 : to take 2.'5mg'$  daily except 1.'25mg'$  on Tues/Thur)  . [DISCONTINUED] chlorzoxazone (PARAFON) 500 MG tablet Take 500 mg by mouth 2 (two) times daily.   . [DISCONTINUED] enoxaparin (LOVENOX) 80 MG/0.8ML injection Inject 0.85 mLs (85 mg total) into the skin every 12 (twelve) hours for 6 days.  HAVE PCP or CARDIOLOGY FOLLOW INR DAILY UNTIL THERAPEUTIC  . [DISCONTINUED] fexofenadine (ALLEGRA) 180 MG tablet Take 180 mg by mouth as needed. For allergies   No facility-administered encounter medications on file as of 07/14/2018.     PAST MEDICAL HISTORY:   Past Medical History:  Diagnosis Date  . Allergic rhinitis   . Arthritis    "all over"  . CHF (congestive heart failure) ([place])   . Chronic back pain   . DJD (degenerative joint disease)   . GERD (gastroesophageal reflux disease)   . Hyperlipidemia   . Hypertension   . Lumbar stenosis   . OSA (obstructive sleep apnea)    "couldn't handle CPAP; use mouth guard some; not all the time" (01/06/2014)  . PAF (paroxysmal atrial fibrillation) ([place])   . Scoliosis    with radiculopathy L2-S1 with prior surgery  . Small bowel obstruction ([facility])    versus ileus after last bck surgery  . Spondylosis     PAST SURGICAL HISTORY:   Past Surgical History:  Procedure Laterality Date  . BACK SURGERY    . CATARACT EXTRACTION W/ INTRAOCULAR LENS  IMPLANT, BILATERAL  2011  . CHOLECYSTECTOMY N/A 07/07/2018   Procedure: LAPAROSCOPIC CHOLECYSTECTOMY;  Surgeon: Erroll Luna, MD;  Location:  [facility];  Service: General;  Laterality: N/A;  . COLONOSCOPY  12/09/2004  . LIPOMA EXCISION  1980's   "fatty tumors"  . [place] SURGERY  02/2009   "ruptured disc"  . NASAL SEPTUM SURGERY  80's  . POSTERIOR LUMBAR FUSION  06/2010; 10/2011   "placed screws, rods, spacers both times"  . REPAIR DURAL / CSF LEAK  02/2009  . TOTAL ABDOMINAL HYSTERECTOMY  02/1993    SOCIAL HISTORY:   Social History   Socioeconomic History  . Marital status: Married    Spouse name: [name]  . Number of children: 2  . Years of education: 12th   . Highest education level: Not on file  Occupational History  . Occupation: retired    Fish farm manager: RETIRED    Comment: clerical office work  Social Needs  . Financial resource strain: Not on file  . Food insecurity:    Worry: Not on  file    Inability: Not on file  . Transportation needs:    Medical: Not on file    Non-medical: Not on file  Tobacco Use  . Smoking status: Never Smoker  . Smokeless tobacco: Never Used  Substance and Sexual Activity  . Alcohol use: No    Alcohol/week: 0.0 standard drinks    Comment: 02/01/2014 " glasseof wine once in a blue moon; < once/year"  . Drug use: No  . Sexual activity: Never    Comment: hysterectomy  Lifestyle  . Physical activity:    Days per week: Not on file    Minutes per session: Not on file  . Stress: Not on file  Relationships  . Social connections:    Talks on phone: Not on file    Gets together: Not on file    Attends religious service: Not on file    Active member of club or organization: Not on file    Attends meetings of clubs or organizations: Not on file    Relationship status: Not on file  . Intimate partner violence:    Fear of current or ex partner: Not on file    Emotionally abused: Not on file    Physically abused: Not on file    Forced sexual activity: Not on file  Other Topics Concern  . Not on file  Social History Narrative   Patient lives at home with husband [name].   Patient is retired.   Patient has a high school education.    Patient has 2 children     FAMILY HISTORY:   Family Status  Relation Name Status  . Mother  Deceased at age 71       natural causes  . Father  Deceased at age 12       MI  . MGM  Deceased  . MGF  Deceased  . PGM  Deceased  . PGF  Deceased  . Sister  Deceased  . Sister  (Not Specified)  . Sister  (Not Specified)  . Daughter 2 Alive    ROS:  Review of Systems  Constitutional: Negative.   HENT: Negative.   Eyes: Negative.   Respiratory: Negative.   Cardiovascular: Negative.   Gastrointestinal: Positive for abdominal pain and constipation.  Musculoskeletal: Positive for back pain.  Skin: Negative.   Endo/Heme/Allergies: Negative.     PHYSICAL EXAMINATION:    VITALS:   Vitals:   07/14/18 1305   BP: 120/70  Pulse: 82  SpO2: 98%  Weight: 179 lb (81.2 kg)  Height: '5\' 5"'$  (1.651 m)  GEN:  The patient appears stated age and is in NAD. HEENT:  Normocephalic, atraumatic.  The mucous membranes are moist. The superficial temporal arteries are without ropiness or tenderness. CV:  RRR Lungs:  CTAB Neck/HEME:  There are no carotid bruits bilaterally. Skin: Abdominal incisions are healing well.  There is no drainage.  There is no erythema.  Neurological examination:  Orientation: The patient is alert and oriented x3. Cranial nerves: There is good facial symmetry. The speech is fluent and clear with slight pseudobulbar quality. Soft palate rises symmetrically and there is no tongue deviation. Hearing is intact to conversational tone. Sensation: Sensation is intact to light touch throughout Motor: Strength is 5/5 in the bilateral upper and lower extremities.   Shoulder shrug is equal and symmetric.  There is no pronator drift.  Movement examination: Tone: There is normal tone in the upper and lower extremities today. Abnormal movements: None Coordination:  There is  decremation with RAM's, only with toe taps on the left Gait and Station: The patient pushes off of a chair to arise and she is slow to stand up.  Once she is up, she actually walks well with her walker.    LABS:    Chemistry      Component Value Date/Time   NA 143 07/08/2018 1330   K 4.1 07/08/2018 1330   CL 109 07/08/2018 1330   CO2 22 07/08/2018 1330   BUN 20 07/08/2018 1330   CREATININE 1.07 (H) 07/08/2018 1330      Component Value Date/Time   CALCIUM 9.5 07/08/2018 1330   ALKPHOS 89 07/08/2018 0556   AST 28 07/08/2018 0556   ALT 7 07/08/2018 0556   BILITOT 0.6 07/08/2018 0556     Lab Results  Component Value Date   TSH 2.350 02/01/2014     ASSESSMENT/PLAN:  1.  Parkinsonism  -While the patient may have idiopathic Parkinson's disease, I am keeping PSP on the list of differential diagnoses..  She  has some square wave jerks, diplopia (within the first 3 years of onset), and mild downgaze paresis.  We talked about nature, etiology and pathophysiology.   -Levodopa challenge test was done and levodopa was only of mild benefit.  However, the patient does think that it is helpful and it did help the rigidity in the left arm.  In addition, it helped her restless leg as does the pramipexole.    - continue carbidopa/levodopa 25/100 2 po tid  -She will continue her pramipexole, 0.5 mg at night that she takes for restless leg.  -told patient she needed to use walker at all times  -told her she needs to add in safe, CV exercise  -Modified barium swallow on 05/21/2016 was normal.   2.  feet parasthesias  -she is now on gabapentin.  Not sure it is helping. PCP managing  3. Recent hallucinations  -was during hospitalization for LBP and following sx for cholecystectomy.  While neuro felt it was levodopa induced, patient was on oxycodone at the time (and still is), and this was more likely source, in addition to being post surgical and in unfamiliar environment.  Encouraged her to try and wean off of the oxycodone and use Tylenol alone for pain.  She has greatly decreased the dosage and is trying to get off of it.    4.  LBP  -MRI with evidence discitis but ID didn't think that she had it and stopped abx.  Will send to neurosx given continued back pain.  5.  Status post recent cholecystectomy, with continued abdominal pain  -Likely just postsurgical pain.  Patient did not realize she had a follow-up appointment with general surgery.  I gave her that information and gave her her discharge instructions.  Also told her that the oxycodone could be contributing to that constipation, which could be exacerbating abdominal pain.  She is going to try to get off of that pain medication.  6.  Follow-up with me in the next few months, sooner should new neurologic issues arise.      Cc:  [name], MD

## 2018-07-12 NOTE — Patient Instructions (Addendum)
Description   Continue Lovenox injections twice a day. Today take 1.5 tablets and tomorrow take 1 tablets then continue taking 1 tablet daily except 1/2 tablet on Tuesday and Thursday. Recheck INR on Monday.  Call if placed on any new medications  [phone]

## 2018-07-13 ENCOUNTER — Ambulatory Visit: Payer: Medicare Other | Admitting: Neurology

## 2018-07-14 ENCOUNTER — Ambulatory Visit: Payer: Medicare Other | Admitting: Neurology

## 2018-07-14 ENCOUNTER — Encounter: Payer: Self-pay | Admitting: Neurology

## 2018-07-14 VITALS — BP 120/70 | HR 82 | Ht 65.0 in | Wt 179.0 lb

## 2018-07-14 DIAGNOSIS — K5900 Constipation, unspecified: Secondary | ICD-10-CM

## 2018-07-14 DIAGNOSIS — M5442 Lumbago with sciatica, left side: Secondary | ICD-10-CM | POA: Diagnosis not present

## 2018-07-14 DIAGNOSIS — R443 Hallucinations, unspecified: Secondary | ICD-10-CM | POA: Diagnosis not present

## 2018-07-14 DIAGNOSIS — M4640 Discitis, unspecified, site unspecified: Secondary | ICD-10-CM

## 2018-07-14 DIAGNOSIS — M5441 Lumbago with sciatica, right side: Secondary | ICD-10-CM

## 2018-07-14 NOTE — Patient Instructions (Signed)
We are referring you to Dr Vertell Limber at [facility]. They will call you directly to set up an appointment date and time. If you do not hear from them they can be contacted directly at [phone].

## 2018-07-17 ENCOUNTER — Telehealth: Payer: Self-pay | Admitting: Neurology

## 2018-07-17 ENCOUNTER — Ambulatory Visit: Payer: Medicare Other

## 2018-07-17 DIAGNOSIS — Z5181 Encounter for therapeutic drug level monitoring: Secondary | ICD-10-CM

## 2018-07-17 DIAGNOSIS — I4891 Unspecified atrial fibrillation: Secondary | ICD-10-CM

## 2018-07-17 LAB — POCT INR: INR: 2.2 (ref 2.0–3.0)

## 2018-07-17 NOTE — Patient Instructions (Signed)
Description   Discontinue Lovenox injections.  Continue on same dosage 1 tablet daily except 1/2 tablet on Tuesdays and Thursdays. Recheck in 2 weeks.  Call if placed on any new medications  [phone]

## 2018-07-17 NOTE — Telephone Encounter (Signed)
Referral faxed to [facility] at [phone] with confirmation received. They will contact the patient to schedule.  

## 2018-07-19 DIAGNOSIS — M549 Dorsalgia, unspecified: Secondary | ICD-10-CM | POA: Diagnosis not present

## 2018-07-19 DIAGNOSIS — R140 Abdominal distension (gaseous): Secondary | ICD-10-CM | POA: Diagnosis not present

## 2018-07-20 DIAGNOSIS — R1084 Generalized abdominal pain: Secondary | ICD-10-CM | POA: Diagnosis not present

## 2018-07-24 DIAGNOSIS — H00011 Hordeolum externum right upper eyelid: Secondary | ICD-10-CM | POA: Diagnosis not present

## 2018-07-24 DIAGNOSIS — H401131 Primary open-angle glaucoma, bilateral, mild stage: Secondary | ICD-10-CM | POA: Diagnosis not present

## 2018-07-24 DIAGNOSIS — H04123 Dry eye syndrome of bilateral lacrimal glands: Secondary | ICD-10-CM | POA: Diagnosis not present

## 2018-07-24 DIAGNOSIS — H532 Diplopia: Secondary | ICD-10-CM | POA: Diagnosis not present

## 2018-07-26 ENCOUNTER — Telehealth: Payer: Self-pay | Admitting: Neurology

## 2018-07-26 DIAGNOSIS — Z6830 Body mass index (BMI) 30.0-30.9, adult: Secondary | ICD-10-CM | POA: Diagnosis not present

## 2018-07-26 DIAGNOSIS — M4646 Discitis, unspecified, lumbar region: Secondary | ICD-10-CM | POA: Diagnosis not present

## 2018-07-26 NOTE — Telephone Encounter (Signed)
Ref to Dr.Stern at [facility] neurosurgery- the location set her up with Dr. Marcelyn Ditty. She wanted you to know they didn't set her up with the doctor yall preferred. Thanks!

## 2018-07-26 NOTE — Telephone Encounter (Signed)
Dr. Vertell Limber was checked on the referral form. Dr. Carles Collet Juluis Rainier.

## 2018-07-31 ENCOUNTER — Ambulatory Visit: Payer: Medicare Other | Admitting: *Deleted

## 2018-07-31 DIAGNOSIS — Z5181 Encounter for therapeutic drug level monitoring: Secondary | ICD-10-CM

## 2018-07-31 DIAGNOSIS — I4891 Unspecified atrial fibrillation: Secondary | ICD-10-CM

## 2018-07-31 LAB — POCT INR: INR: 2.8 (ref 2.0–3.0)

## 2018-07-31 NOTE — Patient Instructions (Signed)
Description   Continue on same dosage 1 tablet daily except 1/2 tablet on Tuesdays and Thursdays. Recheck in 3 weeks.  Call if placed on any new medications  [phone]

## 2018-08-16 DIAGNOSIS — G20 Parkinson's disease: Secondary | ICD-10-CM | POA: Diagnosis not present

## 2018-08-16 DIAGNOSIS — R109 Unspecified abdominal pain: Secondary | ICD-10-CM | POA: Diagnosis not present

## 2018-08-16 DIAGNOSIS — I4891 Unspecified atrial fibrillation: Secondary | ICD-10-CM | POA: Diagnosis not present

## 2018-08-16 DIAGNOSIS — M549 Dorsalgia, unspecified: Secondary | ICD-10-CM | POA: Diagnosis not present

## 2018-08-17 ENCOUNTER — Telehealth: Payer: Self-pay | Admitting: *Deleted

## 2018-08-17 NOTE — Telephone Encounter (Signed)
Patient called to inform us that she started taking Cipro 500mg  BID for 10 days for diverticulitis. Pt is aware that the med interacts with Coumadin. Pt instructed to take 1/2 tab of Coumadin tomorrow, keep 1 tab on Saturday, take 1/2 tab Sunday, pt is aware to have her green leafy veggies today and another day as she will get some on Saturday and maybe Sunday. Pt has an appt already scheduled on Monday so will check INR at that time.

## 2018-08-21 ENCOUNTER — Other Ambulatory Visit: Payer: Self-pay

## 2018-08-21 ENCOUNTER — Ambulatory Visit: Payer: Medicare Other | Admitting: Pharmacist

## 2018-08-21 DIAGNOSIS — Z5181 Encounter for therapeutic drug level monitoring: Secondary | ICD-10-CM | POA: Diagnosis not present

## 2018-08-21 DIAGNOSIS — I4891 Unspecified atrial fibrillation: Secondary | ICD-10-CM

## 2018-08-21 LAB — POCT INR: INR: 2.5 (ref 2.0–3.0)

## 2018-08-21 NOTE — Patient Instructions (Signed)
Description   This week only take 1 tablet on Monday and Friday, 1/2 tablet all other days. Eat an extra serving of greens toward the end of the week. Then continue on same dosage 1 tablet daily except 1/2 tablet on Tuesdays and Thursdays. Recheck in 2 weeks.  Call if placed on any new medications  [phone]

## 2018-08-22 ENCOUNTER — Telehealth: Payer: Self-pay | Admitting: *Deleted

## 2018-08-22 NOTE — Telephone Encounter (Signed)
   [facility] Pre-operative Risk Assessment    Request for surgical clearance:  1. What type of surgery is being performed? B/L S1 JOINT INJECTION   2. When is this surgery scheduled? 09/14/18   3. What type of clearance is required (medical clearance vs. Pharmacy clearance to hold med vs. Both)? BOTH  4. Are there any medications that need to be held prior to surgery and how long? COUMADIN 5 DAYS PRIOR  5. Practice name and name of physician performing surgery? [facility]; DR. HARKINS   6. What is your office phone number [phone]    7.   What is your office fax number [phone]  8.   Anesthesia type (None, local, MAC, general) ? NONE LISTED   Julaine Hua 08/22/2018, 9:06 AM  _________________________________________________________________   (provider comments below)

## 2018-08-22 NOTE — Telephone Encounter (Signed)
   Primary Cardiologist: Jenkins Rouge, MD  Chart reviewed as part of pre-operative protocol coverage. As below, given past medical history and time since last visit, based on ACC/AHA guidelines, JOHNNI WUNSCHEL would be at acceptable risk for the planned procedure without further cardiovascular testing.   Per pharmacist, "Per office protocol, patient can hold warfarin for 5 days prior to procedure. Patient will not need bridging with Lovenox (enoxaparin) around procedure." Will defer to surgeon's office to inform patient of anticoagulation recommendation in case there are any changes to plan with ongoing Covid situation.  I will route this recommendation to the requesting party via Epic fax function and remove from pre-op pool.  Please call with questions.  [name], PA-C 08/22/2018, 12:53 PM

## 2018-08-22 NOTE — Telephone Encounter (Signed)
Patient with diagnosis of afib on warfarin for anticoagulation.    Procedure: B/L S1 JOINT INJECTION Date of procedure: 09/14/2018  CHADS2-VASc score of  4 (CHF, HTN, AGE, DM2, stroke/tia x 2, CAD, AGE, female)  Per office protocol, patient can hold warfarin for 5 days prior to procedure.   Patient will not need bridging with Lovenox (enoxaparin) around procedure.

## 2018-08-22 NOTE — Telephone Encounter (Signed)
   Primary Cardiologist: Jenkins Rouge, MD  Chart reviewed as part of pre-operative protocol coverage.[name] was last seen on 10/2017 by [name]. He has hx of normal coronares in 2002, normal nuc 2015, HTN, PAF, Parkinson's vs supranuclear palsy, RLS, scoliosis, HLD, GERD, chronic back pain, SBO. CHF listed in PMH but not clearly an actual dx as EF is normal and not mentioned in cardiology notes.  Since that day, [name] has done well from cardiac standpoint per our phone discussion with no new concerns for breakthrough AF or other new cardiac complaints. Therefore, based on ACC/AHA guidelines, the patient would be at acceptable risk for the planned procedure without further cardiovascular testing.   I will route this to pharmD for input on anticoagulation and help coordinate timing of appts.  [name], PA-C 08/22/2018, 9:58 AM

## 2018-08-23 DIAGNOSIS — H409 Unspecified glaucoma: Secondary | ICD-10-CM | POA: Diagnosis not present

## 2018-08-23 DIAGNOSIS — N183 Chronic kidney disease, stage 3 (moderate): Secondary | ICD-10-CM | POA: Diagnosis not present

## 2018-08-23 DIAGNOSIS — G20 Parkinson's disease: Secondary | ICD-10-CM | POA: Diagnosis not present

## 2018-08-23 DIAGNOSIS — I4891 Unspecified atrial fibrillation: Secondary | ICD-10-CM | POA: Diagnosis not present

## 2018-09-01 ENCOUNTER — Telehealth: Payer: Self-pay

## 2018-09-01 NOTE — Telephone Encounter (Signed)

## 2018-09-04 ENCOUNTER — Other Ambulatory Visit: Payer: Self-pay

## 2018-09-04 ENCOUNTER — Ambulatory Visit (INDEPENDENT_AMBULATORY_CARE_PROVIDER_SITE_OTHER): Payer: Medicare Other | Admitting: Pharmacist

## 2018-09-04 DIAGNOSIS — Z5181 Encounter for therapeutic drug level monitoring: Secondary | ICD-10-CM

## 2018-09-04 DIAGNOSIS — I4891 Unspecified atrial fibrillation: Secondary | ICD-10-CM | POA: Diagnosis not present

## 2018-09-04 LAB — POCT INR: INR: 2.7 (ref 2.0–3.0)

## 2018-09-14 DIAGNOSIS — M533 Sacrococcygeal disorders, not elsewhere classified: Secondary | ICD-10-CM | POA: Diagnosis not present

## 2018-09-20 DIAGNOSIS — M5416 Radiculopathy, lumbar region: Secondary | ICD-10-CM | POA: Diagnosis not present

## 2018-09-26 ENCOUNTER — Telehealth: Payer: Self-pay

## 2018-09-26 NOTE — Telephone Encounter (Signed)

## 2018-09-27 ENCOUNTER — Other Ambulatory Visit: Payer: Self-pay

## 2018-09-27 ENCOUNTER — Inpatient Hospital Stay (HOSPITAL_COMMUNITY)
Admission: EM | Admit: 2018-09-27 | Discharge: 2018-10-04 | DRG: 028 | Disposition: A | Discharge status: Skilled Nursing Facility | Payer: Medicare Other | Attending: Internal Medicine | Admitting: Internal Medicine

## 2018-09-27 ENCOUNTER — Encounter (HOSPITAL_COMMUNITY): Payer: Self-pay | Admitting: Emergency Medicine

## 2018-09-27 ENCOUNTER — Emergency Department (HOSPITAL_COMMUNITY): Payer: Medicare Other

## 2018-09-27 DIAGNOSIS — K6812 Psoas muscle abscess: Secondary | ICD-10-CM | POA: Diagnosis present

## 2018-09-27 DIAGNOSIS — G20C Parkinsonism, unspecified: Secondary | ICD-10-CM | POA: Diagnosis present

## 2018-09-27 DIAGNOSIS — R52 Pain, unspecified: Secondary | ICD-10-CM | POA: Diagnosis not present

## 2018-09-27 DIAGNOSIS — I480 Paroxysmal atrial fibrillation: Secondary | ICD-10-CM | POA: Diagnosis not present

## 2018-09-27 DIAGNOSIS — M479 Spondylosis, unspecified: Secondary | ICD-10-CM | POA: Diagnosis present

## 2018-09-27 DIAGNOSIS — R262 Difficulty in walking, not elsewhere classified: Secondary | ICD-10-CM | POA: Diagnosis not present

## 2018-09-27 DIAGNOSIS — G20 Parkinson's disease: Secondary | ICD-10-CM | POA: Diagnosis not present

## 2018-09-27 DIAGNOSIS — I509 Heart failure, unspecified: Secondary | ICD-10-CM | POA: Diagnosis not present

## 2018-09-27 DIAGNOSIS — Z8261 Family history of arthritis: Secondary | ICD-10-CM

## 2018-09-27 DIAGNOSIS — Z4789 Encounter for other orthopedic aftercare: Secondary | ICD-10-CM | POA: Diagnosis not present

## 2018-09-27 DIAGNOSIS — Z9049 Acquired absence of other specified parts of digestive tract: Secondary | ICD-10-CM | POA: Diagnosis not present

## 2018-09-27 DIAGNOSIS — G629 Polyneuropathy, unspecified: Secondary | ICD-10-CM | POA: Diagnosis not present

## 2018-09-27 DIAGNOSIS — I4891 Unspecified atrial fibrillation: Secondary | ICD-10-CM | POA: Diagnosis not present

## 2018-09-27 DIAGNOSIS — G061 Intraspinal abscess and granuloma: Secondary | ICD-10-CM | POA: Diagnosis not present

## 2018-09-27 DIAGNOSIS — G92 Toxic encephalopathy: Secondary | ICD-10-CM | POA: Diagnosis not present

## 2018-09-27 DIAGNOSIS — M48061 Spinal stenosis, lumbar region without neurogenic claudication: Secondary | ICD-10-CM | POA: Diagnosis present

## 2018-09-27 DIAGNOSIS — E875 Hyperkalemia: Secondary | ICD-10-CM | POA: Diagnosis present

## 2018-09-27 DIAGNOSIS — M1990 Unspecified osteoarthritis, unspecified site: Secondary | ICD-10-CM | POA: Diagnosis not present

## 2018-09-27 DIAGNOSIS — I129 Hypertensive chronic kidney disease with stage 1 through stage 4 chronic kidney disease, or unspecified chronic kidney disease: Secondary | ICD-10-CM | POA: Diagnosis not present

## 2018-09-27 DIAGNOSIS — N183 Chronic kidney disease, stage 3 (moderate): Secondary | ICD-10-CM | POA: Diagnosis not present

## 2018-09-27 DIAGNOSIS — M4656 Other infective spondylopathies, lumbar region: Secondary | ICD-10-CM | POA: Diagnosis not present

## 2018-09-27 DIAGNOSIS — M419 Scoliosis, unspecified: Secondary | ICD-10-CM | POA: Diagnosis present

## 2018-09-27 DIAGNOSIS — K819 Cholecystitis, unspecified: Secondary | ICD-10-CM | POA: Diagnosis present

## 2018-09-27 DIAGNOSIS — M4645 Discitis, unspecified, thoracolumbar region: Secondary | ICD-10-CM

## 2018-09-27 DIAGNOSIS — Z9842 Cataract extraction status, left eye: Secondary | ICD-10-CM

## 2018-09-27 DIAGNOSIS — M4625 Osteomyelitis of vertebra, thoracolumbar region: Secondary | ICD-10-CM | POA: Diagnosis not present

## 2018-09-27 DIAGNOSIS — E785 Hyperlipidemia, unspecified: Secondary | ICD-10-CM | POA: Diagnosis not present

## 2018-09-27 DIAGNOSIS — N1831 Chronic kidney disease, stage 3a: Secondary | ICD-10-CM | POA: Diagnosis present

## 2018-09-27 DIAGNOSIS — Z79891 Long term (current) use of opiate analgesic: Secondary | ICD-10-CM

## 2018-09-27 DIAGNOSIS — G4733 Obstructive sleep apnea (adult) (pediatric): Secondary | ICD-10-CM | POA: Diagnosis present

## 2018-09-27 DIAGNOSIS — Z7901 Long term (current) use of anticoagulants: Secondary | ICD-10-CM

## 2018-09-27 DIAGNOSIS — I5032 Chronic diastolic (congestive) heart failure: Secondary | ICD-10-CM | POA: Diagnosis present

## 2018-09-27 DIAGNOSIS — Z7401 Bed confinement status: Secondary | ICD-10-CM | POA: Diagnosis not present

## 2018-09-27 DIAGNOSIS — M4649 Discitis, unspecified, multiple sites in spine: Secondary | ICD-10-CM | POA: Diagnosis not present

## 2018-09-27 DIAGNOSIS — M6281 Muscle weakness (generalized): Secondary | ICD-10-CM | POA: Diagnosis not present

## 2018-09-27 DIAGNOSIS — G8929 Other chronic pain: Secondary | ICD-10-CM | POA: Diagnosis not present

## 2018-09-27 DIAGNOSIS — Z801 Family history of malignant neoplasm of trachea, bronchus and lung: Secondary | ICD-10-CM

## 2018-09-27 DIAGNOSIS — Z5181 Encounter for therapeutic drug level monitoring: Secondary | ICD-10-CM | POA: Diagnosis not present

## 2018-09-27 DIAGNOSIS — G062 Extradural and subdural abscess, unspecified: Secondary | ICD-10-CM

## 2018-09-27 DIAGNOSIS — R2681 Unsteadiness on feet: Secondary | ICD-10-CM | POA: Diagnosis not present

## 2018-09-27 DIAGNOSIS — Z961 Presence of intraocular lens: Secondary | ICD-10-CM | POA: Diagnosis present

## 2018-09-27 DIAGNOSIS — B372 Candidiasis of skin and nail: Secondary | ICD-10-CM | POA: Diagnosis not present

## 2018-09-27 DIAGNOSIS — I130 Hypertensive heart and chronic kidney disease with heart failure and stage 1 through stage 4 chronic kidney disease, or unspecified chronic kidney disease: Secondary | ICD-10-CM | POA: Diagnosis not present

## 2018-09-27 DIAGNOSIS — M5489 Other dorsalgia: Secondary | ICD-10-CM | POA: Diagnosis not present

## 2018-09-27 DIAGNOSIS — M21372 Foot drop, left foot: Secondary | ICD-10-CM | POA: Diagnosis present

## 2018-09-27 DIAGNOSIS — Z8249 Family history of ischemic heart disease and other diseases of the circulatory system: Secondary | ICD-10-CM

## 2018-09-27 DIAGNOSIS — K219 Gastro-esophageal reflux disease without esophagitis: Secondary | ICD-10-CM | POA: Diagnosis present

## 2018-09-27 DIAGNOSIS — R339 Retention of urine, unspecified: Secondary | ICD-10-CM | POA: Diagnosis present

## 2018-09-27 DIAGNOSIS — R41841 Cognitive communication deficit: Secondary | ICD-10-CM | POA: Diagnosis not present

## 2018-09-27 DIAGNOSIS — Z9841 Cataract extraction status, right eye: Secondary | ICD-10-CM

## 2018-09-27 DIAGNOSIS — M545 Low back pain: Secondary | ICD-10-CM | POA: Diagnosis not present

## 2018-09-27 DIAGNOSIS — M5416 Radiculopathy, lumbar region: Secondary | ICD-10-CM | POA: Diagnosis not present

## 2018-09-27 DIAGNOSIS — Z825 Family history of asthma and other chronic lower respiratory diseases: Secondary | ICD-10-CM

## 2018-09-27 DIAGNOSIS — M2550 Pain in unspecified joint: Secondary | ICD-10-CM | POA: Diagnosis not present

## 2018-09-27 DIAGNOSIS — Z1159 Encounter for screening for other viral diseases: Secondary | ICD-10-CM | POA: Diagnosis not present

## 2018-09-27 DIAGNOSIS — Z79899 Other long term (current) drug therapy: Secondary | ICD-10-CM

## 2018-09-27 DIAGNOSIS — Z419 Encounter for procedure for purposes other than remedying health state, unspecified: Secondary | ICD-10-CM

## 2018-09-27 HISTORY — DX: Extradural and subdural abscess, unspecified: G06.2

## 2018-09-27 LAB — CBC WITH DIFFERENTIAL/PLATELET
Abs Immature Granulocytes: 0.07 10*3/uL (ref 0.00–0.07)
Basophils Absolute: 0 10*3/uL (ref 0.0–0.1)
Basophils Relative: 0 %
Eosinophils Absolute: 0 10*3/uL (ref 0.0–0.5)
Eosinophils Relative: 0 %
HCT: 41.5 % (ref 36.0–46.0)
Hemoglobin: 13 g/dL (ref 12.0–15.0)
Immature Granulocytes: 1 %
Lymphocytes Relative: 4 %
Lymphs Abs: 0.4 10*3/uL — ABNORMAL LOW (ref 0.7–4.0)
MCH: 27.1 pg (ref 26.0–34.0)
MCHC: 31.3 g/dL (ref 30.0–36.0)
MCV: 86.5 fL (ref 80.0–100.0)
Monocytes Absolute: 0.9 10*3/uL (ref 0.1–1.0)
Monocytes Relative: 7 %
Neutro Abs: 10.6 10*3/uL — ABNORMAL HIGH (ref 1.7–7.7)
Neutrophils Relative %: 88 %
Platelets: 171 10*3/uL (ref 150–400)
RBC: 4.8 MIL/uL (ref 3.87–5.11)
RDW: 15.1 % (ref 11.5–15.5)
WBC: 12 10*3/uL — ABNORMAL HIGH (ref 4.0–10.5)
nRBC: 0 % (ref 0.0–0.2)

## 2018-09-27 LAB — COMPREHENSIVE METABOLIC PANEL
ALT: 14 U/L (ref 0–44)
AST: 23 U/L (ref 15–41)
Albumin: 3.5 g/dL (ref 3.5–5.0)
Alkaline Phosphatase: 89 U/L (ref 38–126)
Anion gap: 10 (ref 5–15)
BUN: 30 mg/dL — ABNORMAL HIGH (ref 8–23)
CO2: 25 mmol/L (ref 22–32)
Calcium: 9.3 mg/dL (ref 8.9–10.3)
Chloride: 106 mmol/L (ref 98–111)
Creatinine, Ser: 1.22 mg/dL — ABNORMAL HIGH (ref 0.44–1.00)
GFR calc Af Amer: 50 mL/min — ABNORMAL LOW (ref >60)
GFR calc non Af Amer: 43 mL/min — ABNORMAL LOW (ref >60)
Glucose, Bld: 128 mg/dL — ABNORMAL HIGH (ref 70–99)
Potassium: 5.2 mmol/L — ABNORMAL HIGH (ref 3.5–5.1)
Sodium: 141 mmol/L (ref 135–145)
Total Bilirubin: 1.1 mg/dL (ref 0.3–1.2)
Total Protein: 5.8 g/dL — ABNORMAL LOW (ref 6.5–8.1)

## 2018-09-27 LAB — BASIC METABOLIC PANEL
Anion gap: 10 (ref 5–15)
BUN: 25 mg/dL — ABNORMAL HIGH (ref 8–23)
CO2: 26 mmol/L (ref 22–32)
Calcium: 9.3 mg/dL (ref 8.9–10.3)
Chloride: 103 mmol/L (ref 98–111)
Creatinine, Ser: 1.03 mg/dL — ABNORMAL HIGH (ref 0.44–1.00)
GFR calc Af Amer: >60 mL/min (ref >60)
GFR calc non Af Amer: 53 mL/min — ABNORMAL LOW (ref >60)
Glucose, Bld: 113 mg/dL — ABNORMAL HIGH (ref 70–99)
Potassium: 4.5 mmol/L (ref 3.5–5.1)
Sodium: 139 mmol/L (ref 135–145)

## 2018-09-27 LAB — SEDIMENTATION RATE: Sed Rate: 2 mm/hr (ref 0–22)

## 2018-09-27 LAB — PROTIME-INR
INR: 3.4 — ABNORMAL HIGH (ref 0.8–1.2)
Prothrombin Time: 33.7 seconds — ABNORMAL HIGH (ref 11.4–15.2)

## 2018-09-27 LAB — C-REACTIVE PROTEIN: CRP: 1 mg/dL — ABNORMAL HIGH (ref <1.0)

## 2018-09-27 MED ORDER — PANTOPRAZOLE SODIUM 40 MG PO TBEC
40.0000 mg | DELAYED_RELEASE_TABLET | Freq: Two times a day (BID) | ORAL | Status: DC
  Administered 2018-09-27 – 2018-10-04 (×13): 40 mg via ORAL
  Filled 2018-09-27 (×13): qty 1

## 2018-09-27 MED ORDER — SODIUM CHLORIDE 0.9% FLUSH: 3.0000 mL | INTRAVENOUS | Status: DC | PRN

## 2018-09-27 MED ORDER — ACETAMINOPHEN 650 MG RE SUPP: 650.0000 mg | Freq: Four times a day (QID) | RECTAL | Status: DC | PRN

## 2018-09-27 MED ORDER — GADOBUTROL 1 MMOL/ML IV SOLN
8.0000 mL | Freq: Once | INTRAVENOUS | Status: AC | PRN
  Administered 2018-09-27: 8 mL via INTRAVENOUS

## 2018-09-27 MED ORDER — ACETAMINOPHEN 325 MG PO TABS: 650.0000 mg | ORAL_TABLET | Freq: Four times a day (QID) | ORAL | Status: DC | PRN

## 2018-09-27 MED ORDER — NYSTATIN 100000 UNIT/GM EX POWD
Freq: Three times a day (TID) | CUTANEOUS | Status: DC
  Administered 2018-09-27 – 2018-10-01 (×8): via TOPICAL
  Administered 2018-10-01: 16:00:00 1 g via TOPICAL
  Administered 2018-10-02 – 2018-10-04 (×8): via TOPICAL
  Filled 2018-09-27: qty 15

## 2018-09-27 MED ORDER — SODIUM CHLORIDE 0.9% FLUSH
3.0000 mL | Freq: Two times a day (BID) | INTRAVENOUS | Status: DC
  Administered 2018-09-27 – 2018-10-04 (×8): 3 mL via INTRAVENOUS

## 2018-09-27 MED ORDER — LATANOPROST 0.005 % OP SOLN
1.0000 [drp] | Freq: Every day | OPHTHALMIC | Status: DC
  Administered 2018-09-27 – 2018-10-03 (×5): 1 [drp] via OPHTHALMIC
  Filled 2018-09-27: qty 2.5

## 2018-09-27 MED ORDER — GABAPENTIN 300 MG PO CAPS
300.0000 mg | ORAL_CAPSULE | Freq: Two times a day (BID) | ORAL | Status: DC
  Administered 2018-09-27 – 2018-10-04 (×13): 300 mg via ORAL
  Filled 2018-09-27 (×13): qty 1

## 2018-09-27 MED ORDER — OXYCODONE-ACETAMINOPHEN 5-325 MG PO TABS
1.0000 | ORAL_TABLET | Freq: Four times a day (QID) | ORAL | Status: DC | PRN
  Administered 2018-09-27: 2 via ORAL
  Filled 2018-09-27: qty 2

## 2018-09-27 MED ORDER — ONDANSETRON HCL 4 MG/2ML IJ SOLN: 4.0000 mg | Freq: Four times a day (QID) | INTRAMUSCULAR | Status: DC | PRN

## 2018-09-27 MED ORDER — FENTANYL CITRATE (PF) 100 MCG/2ML IJ SOLN
25.0000 ug | Freq: Once | INTRAMUSCULAR | Status: AC
  Administered 2018-09-27: 25 ug via INTRAVENOUS
  Filled 2018-09-27: qty 2

## 2018-09-27 MED ORDER — SODIUM CHLORIDE 0.9% IV SOLUTION
Freq: Once | INTRAVENOUS | Status: AC
  Administered 2018-09-27: 23:00:00 via INTRAVENOUS

## 2018-09-27 MED ORDER — CARBIDOPA-LEVODOPA 25-100 MG PO TABS
2.0000 | ORAL_TABLET | Freq: Three times a day (TID) | ORAL | Status: DC
  Administered 2018-09-27 – 2018-10-04 (×18): 2 via ORAL
  Filled 2018-09-27 (×19): qty 2

## 2018-09-27 MED ORDER — DOCUSATE SODIUM 100 MG PO CAPS
100.0000 mg | ORAL_CAPSULE | Freq: Two times a day (BID) | ORAL | Status: DC
  Administered 2018-09-27 – 2018-10-04 (×13): 100 mg via ORAL
  Filled 2018-09-27 (×13): qty 1

## 2018-09-27 MED ORDER — HYDRALAZINE HCL 20 MG/ML IJ SOLN
10.0000 mg | Freq: Three times a day (TID) | INTRAMUSCULAR | Status: DC | PRN
  Administered 2018-09-27: 10 mg via INTRAVENOUS
  Filled 2018-09-27: qty 1

## 2018-09-27 MED ORDER — ONDANSETRON HCL 4 MG PO TABS: 4.0000 mg | ORAL_TABLET | Freq: Four times a day (QID) | ORAL | Status: DC | PRN

## 2018-09-27 MED ORDER — SACCHAROMYCES BOULARDII 250 MG PO CAPS
250.0000 mg | ORAL_CAPSULE | Freq: Every day | ORAL | Status: DC
  Administered 2018-09-27 – 2018-10-04 (×7): 250 mg via ORAL
  Filled 2018-09-27 (×7): qty 1

## 2018-09-27 MED ORDER — METOPROLOL SUCCINATE ER 100 MG PO TB24
100.0000 mg | ORAL_TABLET | Freq: Every day | ORAL | Status: DC
  Administered 2018-09-27 – 2018-10-04 (×7): 100 mg via ORAL
  Filled 2018-09-27 (×7): qty 1

## 2018-09-27 MED ORDER — FLECAINIDE ACETATE 50 MG PO TABS
50.0000 mg | ORAL_TABLET | Freq: Two times a day (BID) | ORAL | Status: DC
  Administered 2018-09-27 – 2018-10-04 (×13): 50 mg via ORAL
  Filled 2018-09-27 (×13): qty 1

## 2018-09-27 MED ORDER — FENTANYL CITRATE (PF) 100 MCG/2ML IJ SOLN
12.5000 ug | INTRAMUSCULAR | Status: DC | PRN
  Administered 2018-09-27: 12.5 ug via INTRAVENOUS
  Filled 2018-09-27: qty 2

## 2018-09-27 MED ORDER — PRAMIPEXOLE DIHYDROCHLORIDE 1 MG PO TABS
1.0000 mg | ORAL_TABLET | Freq: Every day | ORAL | Status: DC
  Administered 2018-09-27 – 2018-10-04 (×7): 1 mg via ORAL
  Filled 2018-09-27 (×8): qty 1

## 2018-09-27 MED ORDER — PRAVASTATIN SODIUM 40 MG PO TABS
40.0000 mg | ORAL_TABLET | Freq: Every day | ORAL | Status: DC
  Administered 2018-09-27 – 2018-10-03 (×7): 40 mg via ORAL
  Filled 2018-09-27 (×7): qty 1

## 2018-09-27 MED ORDER — SODIUM CHLORIDE 0.9 % IV SOLN
250.0000 mL | INTRAVENOUS | Status: DC | PRN
  Administered 2018-10-02 – 2018-10-03 (×2): 250 mL via INTRAVENOUS

## 2018-09-27 NOTE — ED Triage Notes (Signed)
To ED from MRI outpatient xray- with daughter c/o pt "just is not right" pt states "I have been confused- don't know how to use the telephone right, things are just not right" for 2 weeks. States that since cholecystectomy at end of January things have been "just not right" . MRI on back due to pain-- ordered by pain management dr.

## 2018-09-27 NOTE — H&P (Addendum)
History and Physical  [name] CWU:[id number] DOB: 10/09/1940 DOA: 09/27/2018  PCP: [name] Arabian, MD Patient coming from: Home   I have personally briefly reviewed patient's old medical records in [place]   Chief Complaint: Back pain, confusion.   HPI: [name] is a 78 y.o. female CHF, DJD, lumbar stenosis, paroxysmal A. Fib, Parkinson disease, who was recently discharged from the hospital on February and treated for cholecystitis, underwent laparoscopic cholecystectomy.  During that admission she had a thoracolumbar MRI with several indicators of discitis osteomyelitis at the level T8 12 L1 level. It  was determine that she  did not have discitis and she was not discharged on antibiotics for the same.  During that admission patient was complaining of abdominal pain HIDA scan showed low EF.  She underwent cholecystectomy on 1/31/ 2020.  Patient continued to have back pain, she follow-up with her  neurosurgeon and they order an MRI of the lumbar spine, which showed epidural abscess, worsening diskitis T 12--L 1,  per Dr [name] report.  MRI of the lumbar spine was done outpatient cenetr.  Patient also presented to the ED with worsening confusion. MRI thoracic spine ordered. Neurosurgery Dr [name] has been consulted.   Patient report back pain, radiates to her leg. She report pain 9/10. Constant, worse since 1 week ago. Per daughter patient has been more confused.   Was recently started on prednisone for back pain.   Review of Systems: All systems reviewed and apart from history of presenting illness, are negative.  Past Medical History:  Diagnosis Date  . Allergic rhinitis   . Arthritis    "all over"  . CHF (congestive heart failure) ([place])   . Chronic back pain   . DJD (degenerative joint disease)   . GERD (gastroesophageal reflux disease)   . Hyperlipidemia   . Hypertension   . Lumbar stenosis   . OSA (obstructive sleep apnea)    "couldn't  handle CPAP; use mouth guard some; not all the time" (01/06/2014)  . PAF (paroxysmal atrial fibrillation) ([place])   . Scoliosis    with radiculopathy L2-S1 with prior surgery  . Small bowel obstruction (HCC)    versus ileus after last bck surgery  . Spondylosis    Past Surgical History:  Procedure Laterality Date  . BACK SURGERY    . CATARACT EXTRACTION W/ INTRAOCULAR LENS  IMPLANT, BILATERAL  2011  . CHOLECYSTECTOMY N/A 07/07/2018   Procedure: LAPAROSCOPIC CHOLECYSTECTOMY;  Surgeon: [name] Luna, MD;  Location: [facility];  Service: General;  Laterality: N/A;  . COLONOSCOPY  12/09/2004  . LIPOMA EXCISION  1980's   "fatty tumors"  . [place] SURGERY  02/2009   "ruptured disc"  . NASAL SEPTUM SURGERY  80's  . POSTERIOR LUMBAR FUSION  06/2010; 10/2011   "placed screws, rods, spacers both times"  . REPAIR DURAL / CSF LEAK  02/2009  . TOTAL ABDOMINAL HYSTERECTOMY  02/1993   Social History:  reports that she has never smoked. She has never used smokeless tobacco. She reports that she does not drink alcohol or use drugs.   Allergies  Allergen Reactions  . Lyrica [Pregabalin] Other (See Comments)    Felt loopy  . Zocor [Simvastatin] Other (See Comments)    Myalgias  . Relafen [Nabumetone] Rash    Family History  Problem Relation Age of Onset  . Arthritis Mother   . Heart attack Father   . Hypertension Sister   . Lung cancer Sister   .  Asthma Sister   . Allergies Sister     Prior to Admission medications   Medication Sig Start Date End Date Taking? Authorizing Provider  acetaminophen (TYLENOL) 325 MG tablet Take 325 mg by mouth daily as needed for mild pain.    Yes [provider]  calcium carbonate (TUMS - DOSED IN MG ELEMENTAL CALCIUM) 500 MG chewable tablet Chew 1 tablet by mouth as needed for indigestion or heartburn.    Yes [provider]  carbidopa-levodopa (SINEMET IR) 25-100 MG tablet Take 2 tablets by mouth 3 (three) times daily. 07/09/18  Yes [name], Omair  Latif, DO  flecainide (TAMBOCOR) 50 MG tablet TAKE 1 TABLET BY MOUTH 2 TIMES DAILY Patient taking differently: Take 50 mg by mouth 2 (two) times daily.  02/27/18  Yes [name] Hector, MD  furosemide (LASIX) 20 MG tablet Take 40 mg by mouth as needed for fluid.    Yes [provider]  gabapentin (NEURONTIN) 300 MG capsule Take 300 mg by mouth 2 (two) times daily.    Yes [provider]  metoprolol succinate (TOPROL-XL) 100 MG 24 hr tablet TAKE 1 TABLET BY MOUTH DAILY Patient taking differently: Take 100 mg by mouth daily.  01/30/18  Yes [name] Hector, MD  oxyCODONE-acetaminophen (PERCOCET/ROXICET) 5-325 MG tablet Take 1-2 tablets by mouth every 6 (six) hours as needed for moderate pain.  06/29/18  Yes [provider]  pantoprazole (PROTONIX) 40 MG tablet Take 40 mg by mouth 2 (two) times daily.   Yes [provider]  polyethylene glycol (MIRALAX / GLYCOLAX) packet Take 17 g by mouth daily. 07/10/18  Yes [name], Omair Latif, DO  potassium chloride SA (K-DUR,KLOR-CON) 20 MEQ tablet Take 40 mEq by mouth daily.    Yes [provider]  pramipexole (MIRAPEX) 0.5 MG tablet Take 1 mg by mouth at bedtime. For restless legs   Yes [provider]  pravastatin (PRAVACHOL) 40 MG tablet Take 40 mg by mouth at bedtime.    Yes [provider]  predniSONE (DELTASONE) 10 MG tablet Take 10 mg by mouth See admin instructions. Follow package directions for 12 day dose pack. Take 6 tablets for 4 days, 4 tablets  for 4 days, 2 tablets for 4 days then stop. 09/21/18 10/03/18 Yes [provider]  Probiotic Product (PROBIOTIC-10 PO) Take 1 capsule by mouth daily.    Yes [provider]  TRAVATAN Z 0.004 % SOLN ophthalmic solution Place 1 drop into both eyes at bedtime.  01/21/15  Yes [provider]  warfarin (COUMADIN) 2.5 MG tablet Take 1 tablet (2.5 mg total) by mouth as directed. Patient taking differently: Take 1.25-2.5 mg by mouth See  admin instructions. per anticoag clinic visit 09/04/18 : to take 2.37m daily except 1.258mon Tues/Thur. 06/14/18 06/14/19 Yes NiJosue HectorMD   Physical Exam: Vitals:   09/27/18 1407 09/27/18 1409 09/27/18 1430 09/27/18 1530  BP: (!) 174/69  (!) 167/67 (!) 169/58  Pulse: 69  65 67  Resp: _0 Temp: 98 F (36.7 C)     TempSrc: Oral     SpO2: 97%  99% 100%  Weight:  83 kg    Height:  _1  (1.651 m)       General exam: Moderately built and nourished patient, lying comfortably supine on the gurney in no obvious distress.  Head, eyes and ENT: Nontraumatic and normocephalic. Pupils equally reacting to light and accommodation. Oral mucosa moist.  Neck: Supple. No  JVD, carotid bruit or thyromegaly.  Lymphatics: No lymphadenopathy.  Respiratory system: Clear to auscultation. No increased work of breathing.  Cardiovascular system: S1 and S2 heard, RRR. No JVD, murmurs, gallops, clicks or pedal edema.  Gastrointestinal system: Abdomen is nondistended, soft and nontender. Normal bowel sounds heard. No organomegaly or masses appreciated.  Central nervous system: Alert and oriented. No focal neurological deficits.   Extremities: Symmetric 5 x 5 power. Peripheral pulses symmetrically felt.   Skin: rash, redness and moistness below breast area,   Musculoskeletal system: Negative exam.  Psychiatry: Pleasant and cooperative.   Labs on Admission:  Basic Metabolic Panel: Recent Labs  Lab 09/27/18 1425  NA 141  K 5.2*  CL 106  CO2 25  GLUCOSE 128*  BUN 30*  CREATININE 1.22*  CALCIUM 9.3   Liver Function Tests: Recent Labs  Lab 09/27/18 1425  AST 23  ALT 14  ALKPHOS 89  BILITOT 1.1  PROT 5.8*  ALBUMIN 3.5   No results for input(s): LIPASE, AMYLASE in the last 168 hours. No results for input(s): AMMONIA in the last 168 hours. CBC: Recent Labs  Lab 09/27/18 1425  WBC 12.0*  NEUTROABS 10.6*  HGB 13.0  HCT 41.5  MCV 86.5  PLT 171   Cardiac Enzymes: No  results for input(s): CKTOTAL, CKMB, CKMBINDEX, TROPONINI in the last 168 hours.  BNP (last 3 results) No results for input(s): PROBNP in the last 8760 hours. CBG: No results for input(s): GLUCAP in the last 168 hours.  Radiological Exams on Admission: No results found.  EKG: Independently reviewed.ordered.   Assessment/Plan Active Problems:   ATRIAL FIBRILLATION   Chronic anticoagulation -warfarin therapy   Parkinsonism (HCC)   Chronic kidney disease (CKD), stage III (moderate) (HCC)   Discitis of thoracolumbar region   Epidural abscess  1-Thoracic and Lumbar Back pain; Discitis , epidural abscess  Presents with worsening pain. Had Prior MRI that showed discitis, she was evaluated in the past by ID and Nsx and was consider not to have Discitis.  Presents today with worsening back pain. MRI lumbar spine out patient cent showed worsening Discitis and epidural abscess.  MRI thoracic spine showed; Findings consistent with discitis and osteomyelitis at T12-L1. Large epidural abscess extends from the T12-L1 disc space to the T9-10 disc space. There is cord compression at T11 and T12 due to the large abscess. Paraspinous soft tissue infection and early psoas abscess bilaterally at T12-L1. -will check CRP, ESR. Blood culture ordered.  -Patient was taking coumadin prior to admission, will check INR level. Hold coumadin.  -Discussed with Dr. Zada [name], hold on starting IV antibiotics. He is planing to take patient to OR tomorrow am. He was also recommending anticoagulation reversal for INR goal less than 1.6.  -IV fentanyl and oxycodone for pain management.   2-Parkinson Diseases; continue with Sinemet  3-A fib; continue with metoprolol, fleainide. Hold coumadin.  Might need FFP overnight if INR elevated.   4-Chronic diastolic HF; on PRN lasix.   5-mild hyperkalemia. ? Hemolysis. Repeat B-met.   6-CKD stage III; prior cr at 1.0 Monitor.   7-Acute Metabolic encephalopathy;  Likely  related to infection.  check UA.   8-Skin yeast infection;  Start nystatin powder.  DVT Prophylaxis: SCD, might required Sx.  Code Status: full code Family Communication: Daughter over phone.   Disposition Plan: Admit under inpatient status, patient with epidural abscess, she will required neurosurgery evaluation, might required surgery. She will also required IV antibiotics.    Time spent: 57  minutes.   Elmarie Shiley MD [facility]   09/27/2018, 5:56 PM

## 2018-09-27 NOTE — ED Notes (Signed)
ED TO INPATIENT HANDOFF REPORT  ED Nurse Name and Phone #:  Athanasios Heldman [phone]  S Name/Age/Gender [name] 78 y.o. female Room/Bed: 026C/026C  Code Status   Code Status: Prior  Home/SNF/Other Home Patient oriented to: self, place, time and situation Is this baseline? Yes   Triage Complete: Triage complete  Chief Complaint ams  Triage Note To ED from MRI outpatient xray- with daughter c/o pt "just is not right" pt states "I have been confused- don't know how to use the telephone right, things are just not right" for 2 weeks. States that since cholecystectomy at end of January things have been "just not right" . MRI on back due to pain-- ordered by pain management dr.    Allergies Allergies  Allergen Reactions  . Lyrica [Pregabalin] Other (See Comments)    Felt loopy  . Zocor [Simvastatin] Other (See Comments)    Myalgias  . Relafen [Nabumetone] Rash    Level of Care/Admitting Diagnosis ED Disposition    ED Disposition Condition [facility] Area: [place] [[id number]]  Level of Care: Med-Surg [16]  Covid Evaluation: N/A  Diagnosis: Epidural abscess [[id number]]  Admitting Physician: Elmarie Shiley [phone]  Attending Physician: Elmarie Shiley [phone]  Estimated length of stay: 3 - 4 days  Certification:: I certify this patient will need inpatient services for at least 2 midnights  PT Class (Do Not Modify): Inpatient [101]  PT Acc Code (Do Not Modify): Private [1]       B Medical/Surgery History Past Medical History:  Diagnosis Date  . Allergic rhinitis   . Arthritis    "all over"  . CHF (congestive heart failure) ([place])   . Chronic back pain   . DJD (degenerative joint disease)   . GERD (gastroesophageal reflux disease)   . Hyperlipidemia   . Hypertension   . Lumbar stenosis   . OSA (obstructive sleep apnea)    "couldn't handle CPAP; use mouth guard some; not all the time" (01/06/2014)  . PAF (paroxysmal atrial  fibrillation) ([place])   . Scoliosis    with radiculopathy L2-S1 with prior surgery  . Small bowel obstruction (HCC)    versus ileus after last bck surgery  . Spondylosis    Past Surgical History:  Procedure Laterality Date  . BACK SURGERY    . CATARACT EXTRACTION W/ INTRAOCULAR LENS  IMPLANT, BILATERAL  2011  . CHOLECYSTECTOMY N/A 07/07/2018   Procedure: LAPAROSCOPIC CHOLECYSTECTOMY;  Surgeon: Erroll Luna, MD;  Location: [facility];  Service: General;  Laterality: N/A;  . COLONOSCOPY  12/09/2004  . LIPOMA EXCISION  1980's   "fatty tumors"  . [place] SURGERY  02/2009   "ruptured disc"  . NASAL SEPTUM SURGERY  80's  . POSTERIOR LUMBAR FUSION  06/2010; 10/2011   "placed screws, rods, spacers both times"  . REPAIR DURAL / CSF LEAK  02/2009  . TOTAL ABDOMINAL HYSTERECTOMY  02/1993     A IV Location/Drains/Wounds Patient Lines/Drains/Airways Status   Active Line/Drains/Airways    Name:   Placement date:   Placement time:   Site:   Days:   Peripheral IV 09/27/18 Left Antecubital   09/27/18    1446    Antecubital   less than 1   Incision 10/12/11 Back Bilateral   10/12/11    0857     2542   Incision (Closed) 07/07/18 Abdomen Other (Comment)   07/07/18    1021     82   Incision -  4 Ports Abdomen Umbilicus Mid;Upper Right;Upper;Lateral Right;Lower;Lateral   07/07/18    1019     82          Intake/Output Last 24 hours No intake or output data in the 24 hours ending 09/27/18 1656  Labs/Imaging Results for orders placed or performed during the hospital encounter of 09/27/18 (from the past 48 hour(s))  CBC with Differential/Platelet     Status: Abnormal   Collection Time: 09/27/18  2:25 PM  Result Value Ref Range   WBC 12.0 (H) 4.0 - 10.5 K/uL   RBC 4.80 3.87 - 5.11 MIL/uL   Hemoglobin 13.0 12.0 - 15.0 g/dL   HCT 41.5 36.0 - 46.0 %   MCV 86.5 80.0 - 100.0 fL   MCH 27.1 26.0 - 34.0 pg   MCHC 31.3 30.0 - 36.0 g/dL   RDW 15.1 11.5 - 15.5 %   Platelets 171 150 - 400 K/uL   nRBC 0.0 0.0  - 0.2 %   Neutrophils Relative % 88 %   Neutro Abs 10.6 (H) 1.7 - 7.7 K/uL   Lymphocytes Relative 4 %   Lymphs Abs 0.4 (L) 0.7 - 4.0 K/uL   Monocytes Relative 7 %   Monocytes Absolute 0.9 0.1 - 1.0 K/uL   Eosinophils Relative 0 %   Eosinophils Absolute 0.0 0.0 - 0.5 K/uL   Basophils Relative 0 %   Basophils Absolute 0.0 0.0 - 0.1 K/uL   Immature Granulocytes 1 %   Abs Immature Granulocytes 0.07 0.00 - 0.07 K/uL    Comment: Performed at [facility], [address], [place], [place] [address]  Comprehensive metabolic panel     Status: Abnormal   Collection Time: 09/27/18  2:25 PM  Result Value Ref Range   Sodium 141 135 - 145 mmol/L   Potassium 5.2 (H) 3.5 - 5.1 mmol/L    Comment: HEMOLYSIS AT THIS LEVEL MAY AFFECT RESULT   Chloride 106 98 - 111 mmol/L   CO2 25 22 - 32 mmol/L   Glucose, Bld 128 (H) 70 - 99 mg/dL   BUN 30 (H) 8 - 23 mg/dL   Creatinine, Ser 1.22 (H) 0.44 - 1.00 mg/dL   Calcium 9.3 8.9 - 10.3 mg/dL   Total Protein 5.8 (L) 6.5 - 8.1 g/dL   Albumin 3.5 3.5 - 5.0 g/dL   AST 23 15 - 41 U/L   ALT 14 0 - 44 U/L   Alkaline Phosphatase 89 38 - 126 U/L   Total Bilirubin 1.1 0.3 - 1.2 mg/dL   GFR calc non Af Amer 43 (L) >60 mL/min   GFR calc Af Amer 50 (L) >60 mL/min   Anion gap 10 5 - 15    Comment: Performed at [facility], [place] [address], [place],  [address]   No results found.  Pending Labs Unresulted Labs (From admission, onward)    Start     Ordered   09/27/18 1648  C-reactive protein  Once,   R     09/27/18 1647   09/27/18 0814  Basic metabolic panel  Once,   R     09/27/18 1647   09/27/18 1647  Sedimentation rate  Once,   R     09/27/18 1647   09/27/18 1429  Blood culture (routine x 2)  BLOOD CULTURE X 2,   STAT     09/27/18 1428          Vitals/Pain Today's Vitals   09/27/18 1407 09/27/18 1409  09/27/18 1430 09/27/18 1530  BP: (!) 174/69  (!) 167/67 (!) 169/58  Pulse: 69  65 67  Resp: 16  17 18   Temp: 98 F (36.7 C)      TempSrc: Oral     SpO2: 97%  99% 100%  Weight:  83 kg    Height:  5\' 5"  (1.651 m)    PainSc:  6       Isolation Precautions No active isolations  Medications Medications - No data to display  Mobility walks with device High fall risk   Focused Assessments Neuro Assessment Handoff:  Swallow screen pass?          Neuro Assessment: Exceptions to WDL Neuro Checks:      Last Documented NIHSS Modified Score:   Has TPA been given? No If patient is a Neuro Trauma and patient is going to OR before floor call report to [place] nurse: [phone] or [phone]     R Recommendations: See Admitting Provider Note  Report given to:   Additional Notes:

## 2018-09-27 NOTE — ED Provider Notes (Addendum)
[facility] EMERGENCY DEPARTMENT Provider Note   CSN: [id number] Arrival date & time: 09/27/18  1350    History   Chief Complaint Chief Complaint  Patient presents with  . Back Pain  . Altered Mental Status    HPI [name] is a 78 y.o. female.     HPI Patient is a 78 year old female who presents the emergency department with some generalized confusion over the past 4 to 5 days without documented fever at home.  At the end of January she had a possible discitis of T12-L1 noted on MRI but after ID consultation and hospitalization it was felt as though she had acute cholecystitis and underwent cholecystectomy.  This was felt not to represent discitis.  She has had progressive worsening of her low back pain and new urinary incontinence.  She states she can still walk but feels slightly weaker.  She denies bowel complaints.  She underwent MRI of her lumbar spine through the neurosurgery office today and was found to have progressive discitis and osteomyelitis with progressive disc collapse and endplate erosion of I50-Y7 with a partially visualized large epidural fluid collection concerning for epidural abscess extending superiorly.  She underwent MRI lumbar spine and did not undergo MRI thoracic spine.  She has a developing small right psoas abscess as well.   Past Medical History:  Diagnosis Date  . Allergic rhinitis   . Arthritis    "all over"  . CHF (congestive heart failure) ([place])   . Chronic back pain   . DJD (degenerative joint disease)   . GERD (gastroesophageal reflux disease)   . Hyperlipidemia   . Hypertension   . Lumbar stenosis   . OSA (obstructive sleep apnea)    "couldn't handle CPAP; use mouth guard some; not all the time" (01/06/2014)  . PAF (paroxysmal atrial fibrillation) ([place])   . Scoliosis    with radiculopathy L2-S1 with prior surgery  . Small bowel obstruction (HCC)    versus ileus after last bck surgery  . Spondylosis      Patient Active Problem List   Diagnosis Date Noted  . Abnormal glucose tolerance test 07/01/2018  . Colon, diverticulosis 07/01/2018  . 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 Glaucoma 07/01/2018  . Chronic kidney disease (CKD), stage III (moderate) ([place]) 07/01/2018  . Neuropathy 07/01/2018  . Pure hypercholesterolemia 07/01/2018  . Restless leg 07/01/2018  . Discitis of thoracolumbar region 07/01/2018  . Acute bronchitis due to infection 05/04/2017  . Cough 04/21/2016  . Preoperative cardiovascular examination 04/18/2015  . Parkinsonism ([place]) 05/15/2014  . Chronic anticoagulation -warfarin therapy 02/03/2014  . Atrial fibrillation with RVR ([place]) 01/31/2014  . Gait instability 11/16/2013  . Encounter for therapeutic drug monitoring 07/20/2013  . Edema 03/03/2010  . ATRIAL FIBRILLATION 04/23/2008  . Elevated lipids 01/31/2008  . Obstructive sleep apnea 01/31/2008  . GLAUCOMA 01/30/2008  . Seasonal and perennial allergic rhinitis 01/30/2008  . Osteoarthritis 01/30/2008    Past Surgical History:  Procedure Laterality Date  . BACK SURGERY    . CATARACT EXTRACTION W/ INTRAOCULAR LENS  IMPLANT, BILATERAL  2011  . CHOLECYSTECTOMY N/A 07/07/2018   Procedure: LAPAROSCOPIC CHOLECYSTECTOMY;  Surgeon: Erroll Luna, MD;  Location: [facility];  Service: General;  Laterality: N/A;  . COLONOSCOPY  12/09/2004  . LIPOMA EXCISION  1980's   "fatty tumors"  . [place] SURGERY  02/2009   "ruptured disc"  . NASAL SEPTUM SURGERY  80's  . POSTERIOR LUMBAR FUSION  06/2010; 10/2011   "placed screws, rods,  spacers both times"  . REPAIR DURAL / CSF LEAK  02/2009  . TOTAL ABDOMINAL HYSTERECTOMY  02/1993     OB History   No obstetric history on file.      Home Medications    Prior to Admission medications   Medication Sig Start Date End Date Taking? Authorizing Provider  acetaminophen (TYLENOL) 325 MG tablet Take 325 mg by mouth daily as needed for mild pain (pain).     [provider]  calcium carbonate  (TUMS - DOSED IN MG ELEMENTAL CALCIUM) 500 MG chewable tablet Chew 1 tablet by mouth as needed for indigestion or heartburn.     [provider]  carbidopa-levodopa (SINEMET IR) 25-100 MG tablet Take 2 tablets by mouth 3 (three) times daily. 07/09/18   Raiford Noble Latif, DO  flecainide (TAMBOCOR) 50 MG tablet TAKE 1 TABLET BY MOUTH 2 TIMES DAILY Patient taking differently: Take 50 mg by mouth 2 (two) times daily.  02/27/18   Josue Hector, MD  furosemide (LASIX) 20 MG tablet Take 40 mg by mouth as needed.     [provider]  gabapentin (NEURONTIN) 300 MG capsule Take 300 mg by mouth at bedtime.    [provider]  metoprolol succinate (TOPROL-XL) 100 MG 24 hr tablet TAKE 1 TABLET BY MOUTH DAILY Patient taking differently: Take 100 mg by mouth daily.  01/30/18   Josue Hector, MD  oxyCODONE-acetaminophen (PERCOCET/ROXICET) 5-325 MG tablet Take 1-2 tablets by mouth every 6 (six) hours as needed. 06/29/18   [provider]  pantoprazole (PROTONIX) 40 MG tablet Take 40 mg by mouth 2 (two) times daily.    [provider]  polyethylene glycol (MIRALAX / GLYCOLAX) packet Take 17 g by mouth daily. 07/10/18   Raiford Noble Latif, DO  potassium chloride SA (K-DUR,KLOR-CON) 20 MEQ tablet Take 40 mEq by mouth daily.     [provider]  pramipexole (MIRAPEX) 0.5 MG tablet Take 0.5-1 mg by mouth at bedtime. For restless legs    [provider]  pravastatin (PRAVACHOL) 40 MG tablet Take 40 mg by mouth at bedtime.     [provider]  Probiotic Product (PROBIOTIC-10 PO) Take by mouth daily.    [provider]  TRAVATAN Z 0.004 % SOLN ophthalmic solution Place 1 drop into both eyes at bedtime.  01/21/15   [provider]  warfarin (COUMADIN) 2.5 MG tablet Take 1 tablet (2.5 mg total) by mouth as directed. Patient taking differently: Take 2.5 mg by mouth See admin instructions. per anticoag clinic visit 06/28/18 : to take 2.5mg   daily except 1.25mg  on Tues/Thur 06/14/18 06/14/19  Josue Hector, MD    Family History Family History  Problem Relation Age of Onset  . Arthritis Mother   . Heart attack Father   . Hypertension Sister   . Lung cancer Sister   . Asthma Sister   . Allergies Sister     Social History Social History   Tobacco Use  . Smoking status: Never Smoker  . Smokeless tobacco: Never Used  Substance Use Topics  . Alcohol use: No    Alcohol/week: 0.0 standard drinks    Comment: 02/01/2014 " glasseof wine once in a blue moon; < once/year"  . Drug use: No     Allergies   Lyrica [pregabalin]; Zocor [simvastatin]; and Relafen [nabumetone]   Review of Systems Review of Systems  All other systems reviewed and are negative.    Physical Exam Updated Vital Signs BP Marland Kitchen)  174/69 (BP Location: Right Arm)   Pulse 69   Temp 98 F (36.7 C) (Oral)   Resp 16   Ht 5\' 5"  (1.651 m)   Wt 83 kg   SpO2 97%   BMI 30.45 kg/m   Physical Exam Vitals signs and nursing note reviewed.  Constitutional:      General: She is not in acute distress.    Appearance: She is well-developed.  HENT:     Head: Normocephalic and atraumatic.  Neck:     Musculoskeletal: Normal range of motion.  Cardiovascular:     Rate and Rhythm: Normal rate and regular rhythm.     Heart sounds: Normal heart sounds.  Pulmonary:     Effort: Pulmonary effort is normal.     Breath sounds: Normal breath sounds.  Abdominal:     General: There is no distension.     Palpations: Abdomen is soft.     Tenderness: There is no abdominal tenderness.  Musculoskeletal: Normal range of motion.        General: No tenderness.     Comments: Mild thoracic and lumbar tenderness without specific point tenderness overlying skin changes  Skin:    General: Skin is warm and dry.  Neurological:     General: No focal deficit present.     Mental Status: She is alert and oriented to person, place, and time.     Comments: Able to left each leg off  the bed and plantar and dorsiflex feet equal bilaterally  Psychiatric:        Judgment: Judgment normal.      ED Treatments / Results  Labs (all labs ordered are listed, but only abnormal results are displayed) Labs Reviewed  CBC WITH DIFFERENTIAL/PLATELET - Abnormal; Notable for the following components:      Result Value   WBC 12.0 (*)    Neutro Abs 10.6 (*)    Lymphs Abs 0.4 (*)    All other components within normal limits  COMPREHENSIVE METABOLIC PANEL - Abnormal; Notable for the following components:   Potassium 5.2 (*)    Glucose, Bld 128 (*)    BUN 30 (*)    Creatinine, Ser 1.22 (*)    Total Protein 5.8 (*)    GFR calc non Af Amer 43 (*)    GFR calc Af Amer 50 (*)    All other components within normal limits  CULTURE, BLOOD (ROUTINE X 2)  CULTURE, BLOOD (ROUTINE X 2)    EKG None  Radiology No results found.  Procedures .Critical Care Performed by: Jola Schmidt, MD Authorized by: Jola Schmidt, MD   Critical care provider statement:    Critical care time (minutes):  35   Critical care was time spent personally by me on the following activities:  Discussions with consultants, evaluation of patient's response to treatment, examination of patient, ordering and performing treatments and interventions, ordering and review of laboratory studies, ordering and review of radiographic studies, pulse oximetry, re-evaluation of patient's condition, obtaining history from patient or surrogate and review of old charts   (including critical care time)  Medications Ordered in ED Medications - No data to display   Initial Impression / Assessment and Plan / ED Course  I have reviewed the triage vital signs and the nursing notes.  Pertinent labs & imaging results that were available during my care of the patient were reviewed by me and considered in my medical decision making (see chart for details).  Lumbar MRI completed as an outpatient today demonstrates  evidence of worsening T12-L1 discitis/osteomyelitis with evidence of ventral epidural abscess with flattening of the cord resulting in moderate to severe spinal stenosis.  Patient will need admission to the hospital and neurosurgery consultation.  Given the altered mental status and age of the patient she will benefit from hospitalist admission, ID consultation, neurosurgical consultation.  She will kept n.p.o. today in case neurosurgery wants to intervene however she is still can move her legs and may not require emergent surgical decompression.  Call placed to Dr. Zada Finders  Incomplete examination of the thoracic spine that she will go back for MRI thoracic spine at this time  Will withhold abx until further discussion with NSU.    Final Clinical Impressions(s) / ED Diagnoses   Final diagnoses:  Epidural abscess  Discitis of thoracolumbar region    ED Discharge Orders    None       Jola Schmidt, MD 09/27/18 [place], Dallen Bunte, MD 09/27/18 [phone]

## 2018-09-27 NOTE — ED Notes (Signed)
Pt going to MRI at this time.

## 2018-09-27 NOTE — ED Provider Notes (Signed)
I discussed the patient's radiographic findings with Dr. Carlis Abbott, our radiologist. Dr. Venetia Constable the neurosurgeon, and Dr. Tyrell Antonio, our internal medicine colleague are both aware of the patient.   Carmin Muskrat, MD 09/27/18 [phone]

## 2018-09-27 NOTE — Progress Notes (Signed)
[name] [id number] Admission Data: 09/27/2018 7:32 PM Attending Provider: Elmarie Shiley, MD  DJS:[id number], [name] [place], MD Consults/ Treatment Team: Treatment Team:  Judith Part, MD  [name] is a 78 y.o. female patient admitted from ED awake, alert  & orientated  X 3,  Full Code, VSS - Blood pressure (!) 220/94, pulse 70, temperature 98.1 F (36.7 C), temperature source Oral, resp. rate 20, height 5\' 5"  (1.651 m), weight 81.4 kg, SpO2 97 %., O2  Room Air, no c/o shortness of breath, no c/o chest pain, no distress noted.    IV site WDL:  antecubital left, condition patent and no redness with a transparent dsg that's clean dry and intact.  Allergies:   Allergies  Allergen Reactions  . Lyrica [Pregabalin] Other (See Comments)    Felt loopy  . Zocor [Simvastatin] Other (See Comments)    Myalgias  . Relafen [Nabumetone] Rash     Past Medical History:  Diagnosis Date  . Allergic rhinitis   . Arthritis    "all over"  . CHF (congestive heart failure) ([place])   . Chronic back pain   . DJD (degenerative joint disease)   . GERD (gastroesophageal reflux disease)   . Hyperlipidemia   . Hypertension   . Lumbar stenosis   . OSA (obstructive sleep apnea)    "couldn't handle CPAP; use mouth guard some; not all the time" (01/06/2014)  . PAF (paroxysmal atrial fibrillation) ([place])   . Scoliosis    with radiculopathy L2-S1 with prior surgery  . Small bowel obstruction (HCC)    versus ileus after last bck surgery  . Spondylosis       Pt orientation to unit, room and routine. Information packet given to patient/family and safety video watched.  Admission INP armband ID verified with patient/family, and in place. SR up x 2, fall risk assessment complete with Patient and family verbalizing understanding of risks associated with falls. Pt verbalizes an understanding of how to use the call bell and to call for help before getting out of bed.     Will cont to monitor  and assist as needed.  Tresa Endo, RN 09/27/2018 7:32 PM

## 2018-09-28 ENCOUNTER — Other Ambulatory Visit: Payer: Self-pay

## 2018-09-28 ENCOUNTER — Inpatient Hospital Stay (HOSPITAL_COMMUNITY): Payer: Medicare Other

## 2018-09-28 ENCOUNTER — Encounter (HOSPITAL_COMMUNITY)
Admission: EM | Disposition: A | Discharge status: Skilled Nursing Facility | Payer: Self-pay | Source: Home / Self Care | Attending: Family Medicine

## 2018-09-28 ENCOUNTER — Inpatient Hospital Stay (HOSPITAL_COMMUNITY): Payer: Medicare Other | Admitting: Anesthesiology

## 2018-09-28 DIAGNOSIS — N183 Chronic kidney disease, stage 3 (moderate): Secondary | ICD-10-CM

## 2018-09-28 DIAGNOSIS — I4891 Unspecified atrial fibrillation: Secondary | ICD-10-CM

## 2018-09-28 DIAGNOSIS — G20 Parkinson's disease: Secondary | ICD-10-CM

## 2018-09-28 HISTORY — PX: THORACIC LAMINECTOMY FOR EPIDURAL ABSCESS: SHX6115

## 2018-09-28 LAB — PREPARE FRESH FROZEN PLASMA: Unit division: 0

## 2018-09-28 LAB — CBC
HCT: 38.9 % (ref 36.0–46.0)
Hemoglobin: 12.4 g/dL (ref 12.0–15.0)
MCH: 27.1 pg (ref 26.0–34.0)
MCHC: 31.9 g/dL (ref 30.0–36.0)
MCV: 85.1 fL (ref 80.0–100.0)
Platelets: 148 10*3/uL — ABNORMAL LOW (ref 150–400)
RBC: 4.57 MIL/uL (ref 3.87–5.11)
RDW: 15.2 % (ref 11.5–15.5)
WBC: 8.2 10*3/uL (ref 4.0–10.5)
nRBC: 0 % (ref 0.0–0.2)

## 2018-09-28 LAB — BPAM FFP
Blood Product Expiration Date: 202004252359
ISSUE DATE / TIME: 202004222226
Unit Type and Rh: 6200

## 2018-09-28 LAB — COMPREHENSIVE METABOLIC PANEL
ALT: 7 U/L (ref 0–44)
AST: 20 U/L (ref 15–41)
Albumin: 3.5 g/dL (ref 3.5–5.0)
Alkaline Phosphatase: 88 U/L (ref 38–126)
Anion gap: 9 (ref 5–15)
BUN: 27 mg/dL — ABNORMAL HIGH (ref 8–23)
CO2: 27 mmol/L (ref 22–32)
Calcium: 9.2 mg/dL (ref 8.9–10.3)
Chloride: 104 mmol/L (ref 98–111)
Creatinine, Ser: 1.07 mg/dL — ABNORMAL HIGH (ref 0.44–1.00)
GFR calc Af Amer: 59 mL/min — ABNORMAL LOW (ref >60)
GFR calc non Af Amer: 51 mL/min — ABNORMAL LOW (ref >60)
Glucose, Bld: 92 mg/dL (ref 70–99)
Potassium: 4 mmol/L (ref 3.5–5.1)
Sodium: 140 mmol/L (ref 135–145)
Total Bilirubin: 1.1 mg/dL (ref 0.3–1.2)
Total Protein: 5.9 g/dL — ABNORMAL LOW (ref 6.5–8.1)

## 2018-09-28 LAB — PROTIME-INR
INR: 2.6 — ABNORMAL HIGH (ref 0.8–1.2)
Prothrombin Time: 27.6 seconds — ABNORMAL HIGH (ref 11.4–15.2)

## 2018-09-28 LAB — SURGICAL PCR SCREEN
MRSA, PCR: NEGATIVE
Staphylococcus aureus: NEGATIVE

## 2018-09-28 SURGERY — THORACIC LAMINECTOMY FOR EPIDURAL ABSCESS
Anesthesia: General | Site: Back

## 2018-09-28 MED ORDER — HYDROMORPHONE HCL 1 MG/ML IJ SOLN
1.0000 mg | INTRAMUSCULAR | Status: DC | PRN
  Administered 2018-09-28 – 2018-10-01 (×9): 1 mg via INTRAVENOUS
  Filled 2018-09-28 (×9): qty 1

## 2018-09-28 MED ORDER — VANCOMYCIN HCL 10 G IV SOLR
1500.0000 mg | Freq: Once | INTRAVENOUS | Status: AC
  Administered 2018-09-28: 1500 mg via INTRAVENOUS
  Filled 2018-09-28: qty 1500

## 2018-09-28 MED ORDER — FENTANYL CITRATE (PF) 100 MCG/2ML IJ SOLN
INTRAMUSCULAR | Status: AC
  Filled 2018-09-28: qty 2

## 2018-09-28 MED ORDER — METHYLPREDNISOLONE ACETATE 80 MG/ML IJ SUSP
INTRAMUSCULAR | Status: AC
  Filled 2018-09-28: qty 1

## 2018-09-28 MED ORDER — SODIUM CHLORIDE 0.9 % IV SOLN: INTRAVENOUS | Status: DC | PRN

## 2018-09-28 MED ORDER — ACETAMINOPHEN 500 MG PO TABS
1000.0000 mg | ORAL_TABLET | Freq: Three times a day (TID) | ORAL | Status: DC
  Administered 2018-09-28 – 2018-10-04 (×18): 1000 mg via ORAL
  Filled 2018-09-28 (×19): qty 2

## 2018-09-28 MED ORDER — HYDRALAZINE HCL 20 MG/ML IJ SOLN
10.0000 mg | Freq: Four times a day (QID) | INTRAMUSCULAR | Status: DC | PRN
  Administered 2018-09-28: 10 mg via INTRAVENOUS

## 2018-09-28 MED ORDER — LIDOCAINE-EPINEPHRINE 1 %-1:100000 IJ SOLN
INTRAMUSCULAR | Status: AC
  Filled 2018-09-28: qty 1

## 2018-09-28 MED ORDER — MIDAZOLAM HCL 2 MG/2ML IJ SOLN
INTRAMUSCULAR | Status: AC
  Filled 2018-09-28: qty 2

## 2018-09-28 MED ORDER — FENTANYL CITRATE (PF) 250 MCG/5ML IJ SOLN
INTRAMUSCULAR | Status: AC
  Filled 2018-09-28: qty 5

## 2018-09-28 MED ORDER — VITAMIN K1 10 MG/ML IJ SOLN
10.0000 mg | Freq: Once | INTRAVENOUS | Status: AC
  Administered 2018-09-28: 10 mg via INTRAVENOUS
  Filled 2018-09-28: qty 1

## 2018-09-28 MED ORDER — FENTANYL CITRATE (PF) 100 MCG/2ML IJ SOLN
50.0000 ug | Freq: Once | INTRAMUSCULAR | Status: AC
  Administered 2018-09-28: 50 ug via INTRAVENOUS

## 2018-09-28 MED ORDER — OXYCODONE HCL 5 MG/5ML PO SOLN: 5.0000 mg | Freq: Once | ORAL | Status: AC | PRN

## 2018-09-28 MED ORDER — ONDANSETRON HCL 4 MG/2ML IJ SOLN
INTRAMUSCULAR | Status: AC
  Filled 2018-09-28: qty 2

## 2018-09-28 MED ORDER — HYDRALAZINE HCL 20 MG/ML IJ SOLN
INTRAMUSCULAR | Status: AC
  Filled 2018-09-28: qty 1

## 2018-09-28 MED ORDER — OXYCODONE HCL 5 MG PO TABS
5.0000 mg | ORAL_TABLET | ORAL | Status: DC | PRN
  Administered 2018-09-28 – 2018-10-04 (×22): 5 mg via ORAL
  Filled 2018-09-28 (×22): qty 1

## 2018-09-28 MED ORDER — OXYCODONE HCL 5 MG PO TABS
5.0000 mg | ORAL_TABLET | Freq: Once | ORAL | Status: AC | PRN
  Administered 2018-09-28: 15:00:00 5 mg via ORAL

## 2018-09-28 MED ORDER — HYDRALAZINE HCL 20 MG/ML IJ SOLN
10.0000 mg | Freq: Three times a day (TID) | INTRAMUSCULAR | Status: DC | PRN
  Administered 2018-10-03 – 2018-10-04 (×2): 10 mg via INTRAVENOUS
  Filled 2018-09-28 (×2): qty 1

## 2018-09-28 MED ORDER — KETAMINE HCL 50 MG/5ML IJ SOSY
PREFILLED_SYRINGE | INTRAMUSCULAR | Status: AC
  Filled 2018-09-28: qty 5

## 2018-09-28 MED ORDER — PROPOFOL 10 MG/ML IV BOLUS
INTRAVENOUS | Status: AC
  Filled 2018-09-28: qty 40

## 2018-09-28 MED ORDER — 0.9 % SODIUM CHLORIDE (POUR BTL) OPTIME
TOPICAL | Status: DC | PRN
  Administered 2018-09-28: 1000 mL

## 2018-09-28 MED ORDER — SUGAMMADEX SODIUM 200 MG/2ML IV SOLN
INTRAVENOUS | Status: DC | PRN
  Administered 2018-09-28: 162.8 mg via INTRAVENOUS

## 2018-09-28 MED ORDER — FENTANYL CITRATE (PF) 100 MCG/2ML IJ SOLN
25.0000 ug | INTRAMUSCULAR | Status: DC | PRN
  Administered 2018-09-28: 50 ug via INTRAVENOUS
  Administered 2018-09-28 (×2): 25 ug via INTRAVENOUS
  Administered 2018-09-28: 15:00:00 50 ug via INTRAVENOUS

## 2018-09-28 MED ORDER — METHOCARBAMOL 1000 MG/10ML IJ SOLN
500.0000 mg | Freq: Three times a day (TID) | INTRAVENOUS | Status: DC | PRN
  Administered 2018-09-29: 500 mg via INTRAVENOUS
  Filled 2018-09-28: qty 5
  Filled 2018-09-28: qty 500

## 2018-09-28 MED ORDER — CEFAZOLIN SODIUM-DEXTROSE 2-3 GM-%(50ML) IV SOLR
INTRAVENOUS | Status: DC | PRN
  Administered 2018-09-28: 2 g via INTRAVENOUS

## 2018-09-28 MED ORDER — LIDOCAINE 2% (20 MG/ML) 5 ML SYRINGE
INTRAMUSCULAR | Status: AC
  Filled 2018-09-28: qty 5

## 2018-09-28 MED ORDER — SODIUM CHLORIDE 0.9 % IV SOLN
INTRAVENOUS | Status: DC | PRN
  Administered 2018-09-28: 5 ug/min via INTRAVENOUS

## 2018-09-28 MED ORDER — KETAMINE HCL 10 MG/ML IJ SOLN
INTRAMUSCULAR | Status: DC | PRN
  Administered 2018-09-28 (×2): 25 mg via INTRAVENOUS

## 2018-09-28 MED ORDER — PROPOFOL 10 MG/ML IV BOLUS
INTRAVENOUS | Status: AC
  Filled 2018-09-28: qty 20

## 2018-09-28 MED ORDER — HYDROMORPHONE HCL 1 MG/ML IJ SOLN: 0.5000 mg | INTRAMUSCULAR | Status: DC | PRN

## 2018-09-28 MED ORDER — FENTANYL CITRATE (PF) 100 MCG/2ML IJ SOLN
INTRAMUSCULAR | Status: AC
  Administered 2018-09-28: 50 ug via INTRAVENOUS
  Filled 2018-09-28: qty 2

## 2018-09-28 MED ORDER — SODIUM CHLORIDE 0.9 % IV SOLN
2.0000 g | INTRAVENOUS | Status: DC
  Administered 2018-09-28 – 2018-10-04 (×6): 2 g via INTRAVENOUS
  Filled 2018-09-28 (×8): qty 20

## 2018-09-28 MED ORDER — PROTHROMBIN COMPLEX CONC HUMAN 500 UNITS IV KIT
1605.0000 [IU] | PACK | Freq: Once | Status: AC
  Administered 2018-09-28: 1605 [IU] via INTRAVENOUS
  Filled 2018-09-28: qty 1105

## 2018-09-28 MED ORDER — ONDANSETRON HCL 4 MG/2ML IJ SOLN: 4.0000 mg | Freq: Once | INTRAMUSCULAR | Status: DC | PRN

## 2018-09-28 MED ORDER — LIDOCAINE-EPINEPHRINE 1 %-1:100000 IJ SOLN
INTRAMUSCULAR | Status: DC | PRN
  Administered 2018-09-28: 10 mL

## 2018-09-28 MED ORDER — THROMBIN 5000 UNITS EX SOLR
CUTANEOUS | Status: AC
  Filled 2018-09-28: qty 5000

## 2018-09-28 MED ORDER — HYDROMORPHONE HCL 1 MG/ML IJ SOLN
INTRAMUSCULAR | Status: AC
  Administered 2018-09-28: 0.25 mg via INTRAVENOUS
  Filled 2018-09-28: qty 1

## 2018-09-28 MED ORDER — OXYCODONE HCL 5 MG PO TABS
ORAL_TABLET | ORAL | Status: AC
  Filled 2018-09-28: qty 1

## 2018-09-28 MED ORDER — SODIUM CHLORIDE 0.9 % IV SOLN
INTRAVENOUS | Status: DC | PRN
  Administered 2018-09-28: 500 mL

## 2018-09-28 MED ORDER — HYDRALAZINE HCL 20 MG/ML IJ SOLN
INTRAMUSCULAR | Status: DC | PRN
  Administered 2018-09-28: 5 mg via INTRAVENOUS

## 2018-09-28 MED ORDER — ROCURONIUM BROMIDE 50 MG/5ML IV SOSY
PREFILLED_SYRINGE | INTRAVENOUS | Status: DC | PRN
  Administered 2018-09-28: 100 mg via INTRAVENOUS

## 2018-09-28 MED ORDER — DEXAMETHASONE SODIUM PHOSPHATE 10 MG/ML IJ SOLN
INTRAMUSCULAR | Status: DC | PRN
  Administered 2018-09-28: 5 mg via INTRAVENOUS

## 2018-09-28 MED ORDER — LIDOCAINE 2% (20 MG/ML) 5 ML SYRINGE
INTRAMUSCULAR | Status: DC | PRN
  Administered 2018-09-28: 100 mg via INTRAVENOUS

## 2018-09-28 MED ORDER — ROCURONIUM BROMIDE 50 MG/5ML IV SOSY
PREFILLED_SYRINGE | INTRAVENOUS | Status: AC
  Filled 2018-09-28: qty 10

## 2018-09-28 MED ORDER — HYDROMORPHONE HCL 1 MG/ML IJ SOLN
INTRAMUSCULAR | Status: AC
  Filled 2018-09-28: qty 0.5

## 2018-09-28 MED ORDER — ONDANSETRON HCL 4 MG/2ML IJ SOLN
INTRAMUSCULAR | Status: DC | PRN
  Administered 2018-09-28: 4 mg via INTRAVENOUS

## 2018-09-28 MED ORDER — FENTANYL CITRATE (PF) 250 MCG/5ML IJ SOLN
INTRAMUSCULAR | Status: DC | PRN
  Administered 2018-09-28 (×2): 50 ug via INTRAVENOUS
  Administered 2018-09-28: 100 ug via INTRAVENOUS
  Administered 2018-09-28: 50 ug via INTRAVENOUS

## 2018-09-28 MED ORDER — LACTATED RINGERS IV SOLN
INTRAVENOUS | Status: DC | PRN
  Administered 2018-09-28: 11:00:00 via INTRAVENOUS

## 2018-09-28 MED ORDER — VANCOMYCIN HCL 1000 MG IV SOLR
1000.0000 mg | INTRAVENOUS | Status: DC
  Administered 2018-09-29 – 2018-09-30 (×2): 1000 mg via INTRAVENOUS
  Filled 2018-09-28 (×3): qty 1000

## 2018-09-28 MED ORDER — PROPOFOL 10 MG/ML IV BOLUS
INTRAVENOUS | Status: DC | PRN
  Administered 2018-09-28: 30 mg via INTRAVENOUS
  Administered 2018-09-28: 150 mg via INTRAVENOUS

## 2018-09-28 MED ORDER — HYDROMORPHONE HCL 1 MG/ML IJ SOLN
0.2500 mg | Freq: Once | INTRAMUSCULAR | Status: AC
  Administered 2018-09-28: 0.25 mg via INTRAVENOUS

## 2018-09-28 MED ORDER — DEXAMETHASONE SODIUM PHOSPHATE 10 MG/ML IJ SOLN
INTRAMUSCULAR | Status: AC
  Filled 2018-09-28: qty 1

## 2018-09-28 MED ORDER — THROMBIN 5000 UNITS EX SOLR
OROMUCOSAL | Status: DC | PRN
  Administered 2018-09-28: 5 mL via TOPICAL

## 2018-09-28 MED ORDER — CEFAZOLIN SODIUM 1 G IJ SOLR
INTRAMUSCULAR | Status: AC
  Filled 2018-09-28: qty 20

## 2018-09-28 MED ORDER — PHENYLEPHRINE HCL (PRESSORS) 10 MG/ML IV SOLN
INTRAVENOUS | Status: DC | PRN
  Administered 2018-09-28: 40 ug via INTRAVENOUS

## 2018-09-28 SURGICAL SUPPLY — 61 items
ADH SKN CLS APL DERMABOND .7 (GAUZE/BANDAGES/DRESSINGS) ×1
BAG DECANTER FOR FLEXI CONT (MISCELLANEOUS) ×2 IMPLANT
BLADE CLIPPER SURG (BLADE) IMPLANT
BLADE SURG 11 STRL SS (BLADE) ×2 IMPLANT
BUR MATCHSTICK NEURO 3.0 LAGG (BURR) ×1 IMPLANT
BUR PRECISION FLUTE 5.0 (BURR) ×1 IMPLANT
CANISTER SUCT 3000ML PPV (MISCELLANEOUS) ×2 IMPLANT
COVER WAND RF STERILE (DRAPES) ×2 IMPLANT
DECANTER SPIKE VIAL GLASS SM (MISCELLANEOUS) ×2 IMPLANT
DERMABOND ADVANCED (GAUZE/BANDAGES/DRESSINGS) ×1
DERMABOND ADVANCED .7 DNX12 (GAUZE/BANDAGES/DRESSINGS) ×1 IMPLANT
DRAPE C-ARM 42X72 X-RAY (DRAPES) ×4 IMPLANT
DRAPE LAPAROTOMY 100X72X124 (DRAPES) ×2 IMPLANT
DRAPE MICROSCOPE LEICA (MISCELLANEOUS) ×2 IMPLANT
DRAPE SURG 17X23 STRL (DRAPES) ×2 IMPLANT
DRSG OPSITE POSTOP 4X8 (GAUZE/BANDAGES/DRESSINGS) ×1 IMPLANT
DURAPREP 26ML APPLICATOR (WOUND CARE) ×2 IMPLANT
ELECT REM PT RETURN 9FT ADLT (ELECTROSURGICAL) ×2
ELECTRODE REM PT RTRN 9FT ADLT (ELECTROSURGICAL) ×1 IMPLANT
GAUZE 4X4 16PLY RFD (DISPOSABLE) IMPLANT
GAUZE SPONGE 4X4 12PLY STRL (GAUZE/BANDAGES/DRESSINGS) IMPLANT
GLOVE BIO SURGEON STRL SZ 6.5 (GLOVE) ×3 IMPLANT
GLOVE BIO SURGEON STRL SZ7.5 (GLOVE) ×2 IMPLANT
GLOVE BIOGEL PI IND STRL 6.5 (GLOVE) IMPLANT
GLOVE BIOGEL PI IND STRL 7.5 (GLOVE) ×1 IMPLANT
GLOVE BIOGEL PI INDICATOR 6.5 (GLOVE) ×1
GLOVE BIOGEL PI INDICATOR 7.5 (GLOVE) ×4
GLOVE EXAM NITRILE LRG STRL (GLOVE) IMPLANT
GLOVE EXAM NITRILE XL STR (GLOVE) IMPLANT
GLOVE EXAM NITRILE XS STR PU (GLOVE) IMPLANT
GLOVE SURG SS PI 7.5 STRL IVOR (GLOVE) ×3 IMPLANT
GOWN STRL REUS W/ TWL LRG LVL3 (GOWN DISPOSABLE) ×2 IMPLANT
GOWN STRL REUS W/ TWL XL LVL3 (GOWN DISPOSABLE) IMPLANT
GOWN STRL REUS W/TWL 2XL LVL3 (GOWN DISPOSABLE) IMPLANT
GOWN STRL REUS W/TWL LRG LVL3 (GOWN DISPOSABLE) ×6
GOWN STRL REUS W/TWL XL LVL3 (GOWN DISPOSABLE)
HEMOSTAT POWDER KIT SURGIFOAM (HEMOSTASIS) ×2 IMPLANT
KIT BASIN OR (CUSTOM PROCEDURE TRAY) ×2 IMPLANT
KIT TURNOVER KIT B (KITS) ×2 IMPLANT
NDL HYPO 18GX1.5 BLUNT FILL (NEEDLE) IMPLANT
NDL SPNL 18GX3.5 QUINCKE PK (NEEDLE) ×1 IMPLANT
NEEDLE HYPO 18GX1.5 BLUNT FILL (NEEDLE) IMPLANT
NEEDLE HYPO 22GX1.5 SAFETY (NEEDLE) ×2 IMPLANT
NEEDLE SPNL 18GX3.5 QUINCKE PK (NEEDLE) ×2 IMPLANT
NS IRRIG 1000ML POUR BTL (IV SOLUTION) ×2 IMPLANT
PACK LAMINECTOMY NEURO (CUSTOM PROCEDURE TRAY) ×2 IMPLANT
PAD ARMBOARD 7.5X6 YLW CONV (MISCELLANEOUS) ×6 IMPLANT
RUBBERBAND STERILE (MISCELLANEOUS) ×4 IMPLANT
SPONGE LAP 4X18 RFD (DISPOSABLE) IMPLANT
STAPLER SKIN PROX WIDE 3.9 (STAPLE) ×1 IMPLANT
SUT MNCRL AB 3-0 PS2 18 (SUTURE) ×2 IMPLANT
SUT VIC AB 0 CT1 18XCR BRD8 (SUTURE) IMPLANT
SUT VIC AB 0 CT1 8-18 (SUTURE)
SUT VIC AB 1 CT1 18XBRD ANBCTR (SUTURE) IMPLANT
SUT VIC AB 1 CT1 8-18 (SUTURE) ×2
SUT VIC AB 2-0 CP2 18 (SUTURE) ×2 IMPLANT
SUT VIC AB 2-0 CT2 18 VCP726D (SUTURE) ×1 IMPLANT
SYR 3ML LL SCALE MARK (SYRINGE) IMPLANT
TOWEL GREEN STERILE (TOWEL DISPOSABLE) ×2 IMPLANT
TOWEL GREEN STERILE FF (TOWEL DISPOSABLE) ×2 IMPLANT
WATER STERILE IRR 1000ML POUR (IV SOLUTION) ×2 IMPLANT

## 2018-09-28 NOTE — Anesthesia Procedure Notes (Signed)
Procedure Name: Intubation Date/Time: 09/28/2018 11:56 AM Performed by: Kathryne Hitch, CRNA Pre-anesthesia Checklist: Patient identified, Emergency Drugs available, Suction available, Patient being monitored and Timeout performed Patient Re-evaluated:Patient Re-evaluated prior to induction Oxygen Delivery Method: Circle system utilized Preoxygenation: Pre-oxygenation with 100% oxygen Induction Type: IV induction, Rapid sequence and Cricoid Pressure applied Laryngoscope Size: Miller and 2 Grade View: Grade I Tube type: Oral Tube size: 7.0 mm Number of attempts: 1 Airway Equipment and Method: Stylet Secured at: 22 cm Tube secured with: Tape Dental Injury: Teeth and Oropharynx as per pre-operative assessment

## 2018-09-28 NOTE — Anesthesia Postprocedure Evaluation (Signed)
Anesthesia Post Note  Patient: [name]  Procedure(s) Performed: THORACIC NINE-THORACIC TEN, THORACIC TEN-THORACIC ELEVEN, THORACIC ELEVEN-THORACIC TWELVE LAMINECTOMIES FOR EPIDURAL ABSCESS (N/A Back)     Patient location during evaluation: PACU Anesthesia Type: General Level of consciousness: awake and alert Pain management: pain level controlled Vital Signs Assessment: post-procedure vital signs reviewed and stable Respiratory status: spontaneous breathing, nonlabored ventilation and respiratory function stable Cardiovascular status: blood pressure returned to baseline and stable Postop Assessment: no apparent nausea or vomiting Anesthetic complications: no    Last Vitals:  Vitals:   09/28/18 1530 09/28/18 1537  BP: (!) 147/68 138/60  Pulse: 63 67  Resp: 14 17  Temp:    SpO2: 97% 95%    Last Pain:  Vitals:   09/28/18 1538  TempSrc:   PainSc: Asleep                 Lidia Collum

## 2018-09-28 NOTE — Transfer of Care (Signed)
Immediate Anesthesia Transfer of Care Note  Patient: [name]  Procedure(s) Performed: THORACIC NINE-THORACIC TEN, THORACIC TEN-THORACIC ELEVEN, THORACIC ELEVEN-THORACIC TWELVE LAMINECTOMIES FOR EPIDURAL ABSCESS (N/A Back)  Patient Location: PACU  Anesthesia Type:General  Level of Consciousness: drowsy and patient cooperative  Airway & Oxygen Therapy: Patient Spontanous Breathing and Patient connected to face mask oxygen  Post-op Assessment: Report given to RN and Post -op Vital signs reviewed and stable  Post vital signs: Reviewed and stable  Last Vitals:  Vitals Value Taken Time  BP 171/77 09/28/2018  2:00 PM  Temp 36.2 C 09/28/2018  2:00 PM  Pulse 69 09/28/2018  2:06 PM  Resp 16 09/28/2018  2:06 PM  SpO2 100 % 09/28/2018  2:06 PM  Vitals shown include unvalidated device data.  Last Pain:  Vitals:   09/28/18 1028  TempSrc:   PainSc: Asleep      Patients Stated Pain Goal: 3 (84/72/07 2182)  Complications: No apparent anesthesia complications

## 2018-09-28 NOTE — Progress Notes (Signed)
PROGRESS NOTE    [name]  OYW:[id number] DOB: 1941/03/04 DOA: 09/27/2018 PCP: [name], MD      Brief Narrative:  [name] is a 78 y.o. 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  Patient had developed acute on chronic back pain around beginning of January.  Was admitted 3 months ago, had MRI that showed discitis, but this was not biopsied and clinically did not appear to be discitis, and so she was discharged.  In the interim, her symptoms did not improve, and so she was referred back to Neurosurgery, who obtained an MRI that showed what appeared to be worsening discitis with epidural abscess, so she was sent to the ER.  In the ER, she was afebrile, HR and RR normal.  ANtibiotics were held in discussion with Neurosurgery, and she was admitted to the hospitalist service for laminectomy.       Assessment & Plan:  Epidural abscess S/p T9-12 laminectomy for epidural abscess by Dr. Zada Finders on 1/00, uncomplicated Interestingly, CRP and ESR are still normal and at no point has she had fever.  To the OR 4/23.  Cultures sent, gram stain negative.  Unfortunately, no biopsy sent.  BCx NGTD. -Start vancomycin and ceftriaxone -Daily BMP -FOllow culture data -Acetaminophen, scheduled -Continue oxycodone, IV Dilaudid for breakthrough     Acute metabolic encephaloapthy THis is reported to be subacute in last week or two.  Reportedly is global "confusion" and "can't use phone right" but without focal neurological deficits (other than those related to ?cord compression above).   -Treat underlying infection Delirium precautions:   -Lights and TV off, minimize interruptions at night  -Blinds open and lights on during day  -Glasses/hearing aid with patient  -Frequent reorientation  -PT/OT when able  -Avoid sedation medications/Beers list medications    Parkinson's disease  -Continue Sinemet, Mirapex  Atrial fibrillation, paroxysmal INR reversed for surgery -Continue metoporlol, flecainide -Hold warfarin    Chronic diastolic CHF Appears euvolemic, not on baseline diuretics. -Continue metoprolol, statin  Other medicaitons -Continue PPI -Continue gabapentin      MDM and disposition: The below labs and imaging reports were reviewed and summarized above.  Medication management as above including anticoagulation management.  The patient was admitted with MRI findings of epidural abscess with possible cord compression, which is an acute illness with threat to bodily function.  THe hospitalist service were asked to admit for Neurosurgery, who took the patient to the OR today for laminectomy, which appears to have been uncomplicated.          DVT prophylaxis: SCDs Code Status: FULL Family Communication: None present    Consultants:   Neurosurgery  Procedures:   T9-12 laminectomy with cultures  Antimicrobials:   Vancomycin 4/23 >>  Ceftriaxone 4/23 >>    Subjective: Has a lot of back pain post-operatively, still quite sedated.  Incontinent in pacu.  No fever.  Mentation reportedly improved this AM, no bleeding.  No numbnes of feet, legs.  Chronic left foot drop, no new leg weakness.  Objective: Vitals:   09/28/18 1452 09/28/18 1516 09/28/18 1530 09/28/18 1537  BP: (!) 187/73 (!) 185/77 (!) 147/68 138/60  Pulse: 66 64 63 67  Resp: (!) _0 Temp:      TempSrc:      SpO2: 99% 97% 97% 95%  Weight:      Height:  Intake/Output Summary (Last 24 hours) at 09/28/2018 1614 Last data filed at 09/28/2018 1344 Gross per 24 hour  Intake 1708 ml  Output 50 ml  Net 1658 ml   Filed Weights   09/27/18 1409 09/27/18 1852  Weight: 83 kg 81.4 kg    Examination: General appearance: elderly adult female, awake but still sedated from ketamine and in no acute distress.   HEENT: Anicteric, conjunctiva pink, lids and lashes  normal. No nasal deformity, discharge, epistaxis.  Lips dry, dentition normal, OP moikst, no oral lesions, hearing normal.   Skin: Warm and dry.  no jaundice.  No suspicious rashes or lesions. Cardiac: RRR, nl S1-S2, no murmurs appreciated.  Capillary refill is brisk.  JVP not visible.  No LE edema.  Radia pulses 2+ and symmetric. Respiratory: Normal respiratory rate and rhythm.  CTAB without rales or wheezes. Abdomen: Abdomen soft.  No TTP. No ascites, distension, hepatosplenomegaly.   MSK: No deformities or effusions. Neuro: Awake but still sedated from anesthesia.  EOMI, moves all extremities. Speech fluent.    Psych: Sedated, attention diminished. Affect blutne.  Judgment and insight appear impaired from anesthesia.    Data Reviewed: I have personally reviewed following labs and imaging studies:  CBC: Recent Labs  Lab 09/27/18 1425 09/28/18 0323  WBC 12.0* 8.2  NEUTROABS 10.6*  --   HGB 13.0 12.4  HCT 41.5 38.9  MCV 86.5 85.1  PLT 171 621*   Basic Metabolic Panel: Recent Labs  Lab 09/27/18 1425 09/27/18 1907 09/28/18 0323  NA 141 139 140  K 5.2* 4.5 4.0  CL 106 103 104  CO2 _0 GLUCOSE 128* 113* 92  BUN 30* 25* 27*  CREATININE 1.22* 1.03* 1.07*  CALCIUM 9.3 9.3 9.2   GFR: Estimated Creatinine Clearance: 47.9 mL/min (A) (by C-G formula based on SCr of 1.07 mg/dL (H)). Liver Function Tests: Recent Labs  Lab 09/27/18 1425 09/28/18 0323  AST 23 20  ALT 14 7  ALKPHOS 89 88  BILITOT 1.1 1.1  PROT 5.8* 5.9*  ALBUMIN 3.5 3.5   No results for input(s): LIPASE, AMYLASE in the last 168 hours. No results for input(s): AMMONIA in the last 168 hours. Coagulation Profile: Recent Labs  Lab 09/27/18 1907 09/28/18 0704  INR 3.4* 2.6*   Cardiac Enzymes: No results for input(s): CKTOTAL, CKMB, CKMBINDEX, TROPONINI in the last 168 hours. BNP (last 3 results) No results for input(s): PROBNP in the last 8760 hours. HbA1C: No results for input(s): HGBA1C in the  last 72 hours. CBG: No results for input(s): GLUCAP in the last 168 hours. Lipid Profile: No results for input(s): CHOL, HDL, LDLCALC, TRIG, CHOLHDL, LDLDIRECT in the last 72 hours. Thyroid Function Tests: No results for input(s): TSH, T4TOTAL, FREET4, T3FREE, THYROIDAB in the last 72 hours. Anemia Panel: No results for input(s): VITAMINB12, FOLATE, FERRITIN, TIBC, IRON, RETICCTPCT in the last 72 hours. Urine analysis:    Component Value Date/Time   COLORURINE YELLOW 07/01/2018 0628   APPEARANCEUR CLEAR 07/01/2018 0628   LABSPEC 1.023 07/01/2018 0628   PHURINE 5.0 07/01/2018 0628   GLUCOSEU NEGATIVE 07/01/2018 0628   HGBUR MODERATE (A) 07/01/2018 0628   BILIRUBINUR NEGATIVE 07/01/2018 0628   KETONESUR 5 (A) 07/01/2018 0628   PROTEINUR NEGATIVE 07/01/2018 0628   UROBILINOGEN 1.0 06/26/2010 1009   NITRITE NEGATIVE 07/01/2018 0628   LEUKOCYTESUR TRACE (A) 07/01/2018 0628   Sepsis Labs: _1 (procalcitonin:4,lacticacidven:4)  ) Recent Results (from the past 240 hour(s))  Blood culture (routine x 2)  Status: None (Preliminary result)   Collection Time: 09/27/18  2:15 PM  Result Value Ref Range Status   Specimen Description BLOOD LEFT ANTECUBITAL  Final   Special Requests   Final    BOTTLES DRAWN AEROBIC AND ANAEROBIC Blood Culture adequate volume   Culture   Final    NO GROWTH < 24 HOURS Performed at [facility], [address], [place], [place] [address]    Report Status PENDING  Incomplete  Blood culture (routine x 2)     Status: None (Preliminary result)   Collection Time: 09/27/18  3:00 PM  Result Value Ref Range Status   Specimen Description BLOOD RIGHT ANTECUBITAL  Final   Special Requests   Final    BOTTLES DRAWN AEROBIC AND ANAEROBIC Blood Culture adequate volume   Culture   Final    NO GROWTH < 24 HOURS Performed at [facility], [place] [address], [place], [place] [address]    Report Status PENDING  Incomplete  Surgical pcr screen     Status:  None   Collection Time: 09/28/18  3:27 AM  Result Value Ref Range Status   MRSA, PCR NEGATIVE NEGATIVE Final   Staphylococcus aureus NEGATIVE NEGATIVE Final    Comment: (NOTE) The Xpert SA Assay (FDA approved for NASAL specimens in patients 18 years of age and older), is one component of a comprehensive surveillance program. It is not intended to diagnose infection nor to guide or monitor treatment. Performed at [facility], [place] [address], [place], [place] [address]   Aerobic/Anaerobic Culture (surgical/deep wound)     Status: None (Preliminary result)   Collection Time: 09/28/18 12:13 PM  Result Value Ref Range Status   Specimen Description ABSCESS DORSAL EPIDURAL  Final   Special Requests NONE  Final   Gram Stain   Final    RARE WBC PRESENT, PREDOMINANTLY PMN NO ORGANISMS SEEN Performed at [facility], [place] [address], [place], [place] [address]    Culture PENDING  Incomplete   Report Status PENDING  Incomplete         Radiology Studies: Dg Thoracolumabar Spine  Result Date: 09/28/2018 CLINICAL DATA:  Localization image. Limited ectomy for epidural abscess. EXAM: THORACOLUMBAR SPINE 1V COMPARISON:  None. FINDINGS: Single lateral intraoperative fluoroscopic images provided. Based on the lumbar spine MRI of 09/27/2018, surgical instruments overlie the posterior elements of the T12 vertebral body. Fluoroscopy provided for 7 seconds. IMPRESSION: Surgical instruments overlying the posterior elements of the T12 vertebral body. Electronically Signed   By: Franki Cabot M.D.   On: 09/28/2018 14:17   Mr Thoracic Spine W Wo Contrast  Result Date: 09/27/2018 CLINICAL DATA:  Low back pain. Lumbar MRI demonstrated epidural abscess extending into the thoracic spine. EXAM: MRI THORACIC WITHOUT AND WITH CONTRAST TECHNIQUE: Multiplanar and multiecho pulse sequences of the thoracic spine were obtained without and with intravenous contrast. CONTRAST:  8 mL Gadovist IV COMPARISON:   Lumbar MRI 09/27/2018. Thoracic and lumbar MRI 07/01/2018 FINDINGS: MRI THORACIC SPINE FINDINGS Alignment:  Normal Vertebrae: Changes of discitis and osteomyelitis at T12-L1. There is increased fluid within the disc space with considerable endplate destruction which has progressed since 07/01/2018. Irregular enhancement of the endplates postcontrast administration. Pedicle screw fusion bilaterally extending from L1 through S1. No other areas of infection or fracture in the thoracic spine Cord: Large epidural abscess in the spinal canal arising from T12-L1 and extending superiorly. Multilocular fluid collection is present in the ventral epidural space extending  cranially to the T9-10 disc space. There is cord compression at T11-12 and T12-L1. Possible gas within the epidural abscess at T10. Paraspinal and other soft tissues: Paraspinous soft tissue thickening at T12-L1 extending into the psoas muscle bilaterally compatible with developing small abscess. Disc levels: Thoracic disc degeneration as below. T7-8: Small right-sided spurring with slight cord flattening. No significant stenosis T8-9: Small central disc protrusion T9-10: Small left-sided disc protrusion T10-11: Large ventral epidural abscess with cord compression T11-12: Large ventral epidural abscess extending to the left of midline with cord compression T12-L1: Discitis and osteomyelitis with endplate destruction and bilateral facet degeneration. Moderate spinal stenosis. IMPRESSION: Findings consistent with discitis and osteomyelitis at T12-L1. Large epidural abscess extends from the T12-L1 disc space to the T9-10 disc space. There is cord compression at T11 and T12 due to the large abscess. Paraspinous soft tissue infection and early psoas abscess bilaterally at T12-L1. These results were called by telephone at the time of interpretation on 09/27/2018 at 6:09 pm to Dr. Vanita Panda , who verbally acknowledged these results. Electronically Signed   By: Franchot Gallo M.D.   On: 09/27/2018 18:10   Dg C-arm 1-60 Min  Result Date: 09/28/2018 CLINICAL DATA:  Localization image. Limited ectomy for epidural abscess. EXAM: THORACOLUMBAR SPINE 1V COMPARISON:  None. FINDINGS: Single lateral intraoperative fluoroscopic images provided. Based on the lumbar spine MRI of 09/27/2018, surgical instruments overlie the posterior elements of the T12 vertebral body. Fluoroscopy provided for 7 seconds. IMPRESSION: Surgical instruments overlying the posterior elements of the T12 vertebral body. Electronically Signed   By: Franki Cabot M.D.   On: 09/28/2018 14:17        Scheduled Meds: . carbidopa-levodopa  2 tablet Oral TID  . docusate sodium  100 mg Oral BID  . fentaNYL      . fentaNYL      . flecainide  50 mg Oral BID  . gabapentin  300 mg Oral BID  . hydrALAZINE      . latanoprost  1 drop Both Eyes QHS  . metoprolol succinate  100 mg Oral Daily  . nystatin   Topical TID  . oxyCODONE      . pantoprazole  40 mg Oral BID  . pramipexole  1 mg Oral QHS  . pravastatin  40 mg Oral QHS  . saccharomyces boulardii  250 mg Oral Daily  . sodium chloride flush  3 mL Intravenous Q12H   Continuous Infusions: . sodium chloride       LOS: 1 day    Time spent: 25 minutes    Edwin Dada, MD [facility] 09/28/2018, 4:14 PM     Please page through [place]:  [email or web address] Password [email or web address] If 7PM-7AM, please contact night-coverage

## 2018-09-28 NOTE — Brief Op Note (Signed)
09/27/2018 - 09/28/2018  1:50 PM  PATIENT:  [name]  78 y.o. female  PRE-OPERATIVE DIAGNOSIS:  Spinal epidural abscess and phlegmon  POST-OPERATIVE DIAGNOSIS:  Spinal epidural abscess and phlegmon  PROCEDURE:  Procedure(s): THORACIC NINE-THORACIC TEN, THORACIC TEN-THORACIC ELEVEN, THORACIC ELEVEN-THORACIC TWELVE LAMINECTOMIES FOR EPIDURAL ABSCESS (N/A)  SURGEON:  Surgeon(s) and Role:    * Stephana Morell, Joyice Faster, MD - Primary  ASSISTANTS: none   ANESTHESIA:   general  EBL:  50 mL   BLOOD ADMINISTERED:none  DRAINS: none   LOCAL MEDICATIONS USED:  LIDOCAINE   SPECIMEN:  Epidural aerobic and anaerobic cultures taken  DISPOSITION OF SPECIMEN:  Microbiology  COUNTS:  YES  TOURNIQUET:  * No tourniquets in log *  DICTATION: .Note written in EPIC  PLAN OF CARE: Admit to inpatient   PATIENT DISPOSITION:  PACU - hemodynamically stable.   Delay start of Pharmacological VTE agent (>24hrs) due to surgical blood loss or risk of bleeding: yes

## 2018-09-28 NOTE — Progress Notes (Signed)
Dr. Zada Finders made aware of INR.

## 2018-09-28 NOTE — Anesthesia Preprocedure Evaluation (Signed)
Anesthesia Evaluation  Patient identified by MRN, date of birth, ID band Patient awake    Reviewed: Allergy & Precautions, NPO status , Patient's Chart, lab work & pertinent test results  History of Anesthesia Complications Negative for: history of anesthetic complications  Airway Mallampati: II  TM Distance: >3 FB Neck ROM: Full    Dental  (+) Teeth Intact, Dental Advisory Given   Pulmonary sleep apnea ,    Pulmonary exam normal breath sounds clear to auscultation       Cardiovascular hypertension, Pt. on medications and Pt. on home beta blockers Normal cardiovascular exam+ dysrhythmias (on Coumadin and flecainide) Atrial Fibrillation  Rhythm:Regular Rate:Normal  TTE 2015: EF 60-65%    Neuro/Psych negative neurological ROS     GI/Hepatic Neg liver ROS, GERD  Medicated and Controlled,  Endo/Other  negative endocrine ROS  Renal/GU Renal InsufficiencyRenal disease     Musculoskeletal  (+) Arthritis ,   Abdominal   Peds  Hematology negative hematology ROS (+)   Anesthesia Other Findings Epidural abscess at T12-L1 with lower extremity neuro deficits  Reproductive/Obstetrics                             Anesthesia Physical  Anesthesia Plan  ASA: III  Anesthesia Plan: General   Post-op Pain Management:    Induction: Intravenous  PONV Risk Score and Plan: 3 and Treatment may vary due to age or medical condition, Ondansetron and Dexamethasone  Airway Management Planned: Oral ETT  Additional Equipment: None  Intra-op Plan:   Post-operative Plan: Extubation in OR  Informed Consent: I have reviewed the patients History and Physical, chart, labs and discussed the procedure including the risks, benefits and alternatives for the proposed anesthesia with the patient or authorized representative who has indicated his/her understanding and acceptance.     Dental advisory given  Plan  Discussed with: CRNA  Anesthesia Plan Comments:         Anesthesia Quick Evaluation

## 2018-09-28 NOTE — Consult Note (Addendum)
Neurosurgery Consultation  Reason for Consult: Spinal epidural abscess Referring Physician: Loleta Books  CC: back pain, leg weakness  HPI: This is a 78 y.o. woman that presents with severe back pain, leg weakness, and increased difficulty with urination. She is somewhat of a poor historian, by combining her explanation with review of her chart, it sounds like she has known possible discitis that was clinically thought to be non-infectious. Her pain worsened and she may have had an ESI, but is unable to describe when or what type of injection it was, but thinks it was in her back. Her main complaint is an increase in her low back pain, but she began having difficulty ambulating and with what sounds like urinary retention in the past week or two. The increase in pain prompted her to go to the ED. She is on warfarin for PAF, no known anti-platelet agent use.    ROS: A 14 point ROS was performed and is negative except as noted in the HPI.   PMHx:  Past Medical History:  Diagnosis Date  . Allergic rhinitis   . Arthritis    "all over"  . CHF (congestive heart failure) ([place])   . Chronic back pain   . DJD (degenerative joint disease)   . GERD (gastroesophageal reflux disease)   . Hyperlipidemia   . Hypertension   . Lumbar stenosis   . OSA (obstructive sleep apnea)    "couldn't handle CPAP; use mouth guard some; not all the time" (01/06/2014)  . PAF (paroxysmal atrial fibrillation) ([place])   . Scoliosis    with radiculopathy L2-S1 with prior surgery  . Small bowel obstruction ([facility])    versus ileus after last bck surgery  . Spondylosis    FamHx:  Family History  Problem Relation Age of Onset  . Arthritis Mother   . Heart attack Father   . Hypertension Sister   . Lung cancer Sister   . Asthma Sister   . Allergies Sister    SocHx:  reports that she has never smoked. She has never used smokeless tobacco. She reports that she does not drink alcohol or use drugs.  Exam: Vital signs in last 24  hours: Temp:  [97.6 F (36.4 C)-98.1 F (36.7 C)] 97.6 F (36.4 C) (04/23 0530) Pulse Rate:  [60-71] 60 (04/23 0530) Resp:  [14-20] 18 (04/23 0530) BP: (105-221)/(48-94) 174/69 (04/23 0530) SpO2:  [97 %-100 %] 98 % (04/23 0530) Weight:  [81.4 kg-83 kg] 81.4 kg (04/22 1852) General: Awake, alert, cooperative, lying in bed in NAD Head: normocephalic and atruamatic HEENT: neck supple Pulmonary: breathing room air comfortably, no evidence of increased work of breathing Abdomen: S NT ND Psych: affect somewhat flat but reactive Extremities: warm and well perfused x4 Neuro: AOx3 Strength 5/5 in BUE, 4+/5 in BLE except left DF/EHL is 0/5, SILT except for diffuse distal left lower extremity numbness   Assessment and Plan: 78 y.o. woman with progressive back pain, difficulty ambulating, and difficulty with urination. MRI T-spine personally reviewed, which shows discitis at T12-L1 with large left paracentral epidural abscess that causes cord compression.  -hold ABx, OR now for laminectomy and evacuation of epidural abscess -patient reportedly confused last night, oriented this morning. I also discussed the situation with her daughter and got verbal consent via telephone given her confusion last night. Discussed r/b/a and expected post-op course with both. -rpt INR still elevated, will give 1500U [facility] for reversal   Judith Part, MD 09/28/18 7:23 AM [facility]

## 2018-09-28 NOTE — Progress Notes (Signed)
Post-op note  Pt seen and evaluated. Some right superficial swelling around her wound, but strength is 4+5 in BLE with return of strength in her left DF/EHL, improved from preop. Given intact fascial closure and improved exam, no need for intervention for possible superficial hematoma. Can provide ice packs prn, keep gauze underneath wound when laying supine.

## 2018-09-28 NOTE — Progress Notes (Signed)
Pt deemed able to consent for blood products by myself and Cristal Deer. Risks and benefits explained, questions/ concerns answered. Consent signed and placed in chart.

## 2018-09-28 NOTE — Op Note (Signed)
PATIENT: [name]  DAY OF SURGERY: 09/28/18   PRE-OPERATIVE DIAGNOSIS:  Spinal thoracolumbar epidural abscess   POST-OPERATIVE DIAGNOSIS:  Spinal thoracolumbar epidural abscess   PROCEDURE:  T10-T12 laminectomies for evacuation of epidural abscess   SURGEON:  Surgeon(s) and Role:    Judith Part, MD - Primary   ANESTHESIA: ETGA   BRIEF HISTORY: This is a 78 year old woman who presented with progressive severe back pain, difficulty with urination, and difficulty ambulating. The patient was found to have T12-L1 discitis with a large epidural abscess and cord compression. This was discussed with the patient and her family as well as risks, benefits, and alternatives and wished to proceed with surgical evacuation.   OPERATIVE DETAIL: The patient was taken to the operating room and anesthesia was induced by the anesthesia team. They were placed on the OR table in the prone position with padding of all pressure points. A formal time out was performed with two patient identifiers and confirmed the operative site. The operative site was marked, hair was clipped with surgical clippers, the area was then prepped and draped in a sterile fashion. Fluoro was used to localize the operative level and a midline incision was placed to expose from T10 to T12. Subperiosteal dissection was performed bilaterally and fluoroscopy was again used to confirm the surgical level.   Decompression was performed, which consisted of bilateral hemilaminotomies at T10-T12 with preservation of the spinous processes and midline ligamentous structures, given the patient's deformity and large construct inferiorly. The dorsal epidural space had what appeared to be either fat necrosis or early abscess / phlegmon that was cultured. The ventral epidural space appeared to have some phlegmon. This was probed with a ball-tipped probe and no purulent drainage was obtained during palpation of the ventral canal and vertebral  body. There was no clear plane between this material and the spinal cord. Given the inability to safely manipulate the cord, the decompression was completed with resection of ligamentum flavum. Antibiotics were then administered. The thecal sac appeared well decompressed. All instrument and sponge counts were correct, the incision was then copiously irrigated and closed in layers. The patient was then returned to anesthesia for emergence. No apparent complications at the completion of the procedure.   EBL:  169mL   DRAINS: none   SPECIMENS: Epidural cultures x2   Judith Part, MD 09/28/18 10:59 AM

## 2018-09-28 NOTE — Progress Notes (Signed)
Pharmacy Antibiotic Note  [name] is a 78 y.o. female admitted on 09/27/2018 with spinal epidural abscess.   Pharmacy has been consulted for vancomycin dosing (also noted on ceftriaxone) -SCr= 1.07, CrCl ~ 50  Plan: -Vancomycin 1500mg  IV x1 followed by 1000mg  IV q24h (calculated AUC= 447; goal 400-550) -Will follow renal function, cultures and clinical progress   Height: 5\' 5"  (165.1 cm) Weight: 179 lb 7.3 oz (81.4 kg) IBW/kg (Calculated) : 57  Temp (24hrs), Avg:97.7 F (36.5 C), Min:97.2 F (36.2 C), Max:98.1 F (36.7 C)  Recent Labs  Lab 09/27/18 1425 09/27/18 1907 09/28/18 0323  WBC 12.0*  --  8.2  CREATININE 1.22* 1.03* 1.07*    Estimated Creatinine Clearance: 47.9 mL/min (A) (by C-G formula based on SCr of 1.07 mg/dL (H)).    Allergies  Allergen Reactions  . Lyrica [Pregabalin] Other (See Comments)    Felt loopy  . Zocor [Simvastatin] Other (See Comments)    Myalgias  . Relafen [Nabumetone] Rash    Antimicrobials this admission: 4/23 vanc 4/23 rocephin  Dose adjustments this admission:   Microbiology results: 4/23 abscess from  wound 4/22 blood x2- ngtd  Thank you for allowing pharmacy to be a part of this patient's care.  Hildred Laser, PharmD Clinical Pharmacist **Pharmacist phone directory can now be found on [email or web address] ([email or web address]).  Listed under [place].

## 2018-09-29 ENCOUNTER — Encounter (HOSPITAL_COMMUNITY): Payer: Self-pay | Admitting: Neurological Surgery

## 2018-09-29 LAB — CBC
HCT: 39.1 % (ref 36.0–46.0)
Hemoglobin: 12.6 g/dL (ref 12.0–15.0)
MCH: 27.5 pg (ref 26.0–34.0)
MCHC: 32.2 g/dL (ref 30.0–36.0)
MCV: 85.4 fL (ref 80.0–100.0)
Platelets: 154 10*3/uL (ref 150–400)
RBC: 4.58 MIL/uL (ref 3.87–5.11)
RDW: 15.2 % (ref 11.5–15.5)
WBC: 12.3 10*3/uL — ABNORMAL HIGH (ref 4.0–10.5)
nRBC: 0 % (ref 0.0–0.2)

## 2018-09-29 LAB — BASIC METABOLIC PANEL
Anion gap: 10 (ref 5–15)
BUN: 25 mg/dL — ABNORMAL HIGH (ref 8–23)
CO2: 25 mmol/L (ref 22–32)
Calcium: 8.8 mg/dL — ABNORMAL LOW (ref 8.9–10.3)
Chloride: 104 mmol/L (ref 98–111)
Creatinine, Ser: 0.89 mg/dL (ref 0.44–1.00)
GFR calc Af Amer: >60 mL/min (ref >60)
GFR calc non Af Amer: >60 mL/min (ref >60)
Glucose, Bld: 114 mg/dL — ABNORMAL HIGH (ref 70–99)
Potassium: 4.2 mmol/L (ref 3.5–5.1)
Sodium: 139 mmol/L (ref 135–145)

## 2018-09-29 LAB — PROTIME-INR
INR: 1.2 (ref 0.8–1.2)
Prothrombin Time: 15.2 seconds (ref 11.4–15.2)

## 2018-09-29 NOTE — Progress Notes (Signed)
Neurosurgery Service Progress Note  Subjective: No acute events overnight, some confusion.    Objective: Vitals:   09/28/18 1627 09/28/18 2137 09/29/18 0306 09/29/18 0509  BP: (!) 133/51 130/68 (!) 133/54 (!) 134/54  Pulse: 69 86 65 61  Resp: (!) 22 18 20 20   Temp: (!) 97.5 F (36.4 C) 97.6 F (36.4 C) 97.6 F (36.4 C) 98.2 F (36.8 C)  TempSrc: Oral Oral Oral Oral  SpO2: 98% 99% 99% 99%  Weight:      Height:       Temp (24hrs), Avg:97.6 F (36.4 C), Min:97.2 F (36.2 C), Max:98.2 F (36.8 C)  CBC Latest Ref Rng & Units 09/29/2018 09/28/2018 09/27/2018  WBC 4.0 - 10.5 K/uL 12.3(H) 8.2 12.0(H)  Hemoglobin 12.0 - 15.0 g/dL 12.6 12.4 13.0  Hematocrit 36.0 - 46.0 % 39.1 38.9 41.5  Platelets 150 - 400 K/uL 154 148(L) 171   BMP Latest Ref Rng & Units 09/29/2018 09/28/2018 09/27/2018  Glucose 70 - 99 mg/dL 114(H) 92 113(H)  BUN 8 - 23 mg/dL 25(H) 27(H) 25(H)  Creatinine 0.44 - 1.00 mg/dL 0.89 1.07(H) 1.03(H)  Sodium 135 - 145 mmol/L 139 140 139  Potassium 3.5 - 5.1 mmol/L 4.2 4.0 4.5  Chloride 98 - 111 mmol/L 104 104 103  CO2 22 - 32 mmol/L 25 27 26   Calcium 8.9 - 10.3 mg/dL 8.8(L) 9.2 9.3    Intake/Output Summary (Last 24 hours) at 09/29/2018 0735 Last data filed at 09/28/2018 1344 Gross per 24 hour  Intake 1400 ml  Output 50 ml  Net 1350 ml    Current Facility-Administered Medications:  .  0.9 %  sodium chloride infusion, 250 mL, Intravenous, PRN, Judith Part, MD .  acetaminophen (TYLENOL) tablet 1,000 mg, 1,000 mg, Oral, Q8H, Danford, Suann Larry, MD, 1,000 mg at 09/29/18 0534 .  carbidopa-levodopa (SINEMET IR) 25-100 MG per tablet immediate release 2 tablet, 2 tablet, Oral, TID, Judith Part, MD, 2 tablet at 09/28/18 2213 .  cefTRIAXone (ROCEPHIN) 2 g in sodium chloride 0.9 % 100 mL IVPB, 2 g, Intravenous, Q24H, Danford, Suann Larry, MD, Last Rate: 200 mL/hr at 09/28/18 2209, 2 g at 09/28/18 2209 .  docusate sodium (COLACE) capsule 100 mg, 100 mg, Oral,  BID, Judith Part, MD, 100 mg at 09/28/18 2213 .  flecainide (TAMBOCOR) tablet 50 mg, 50 mg, Oral, BID, Judith Part, MD, 50 mg at 09/28/18 2213 .  gabapentin (NEURONTIN) capsule 300 mg, 300 mg, Oral, BID, Jyquan Kenley A, MD, 300 mg at 09/28/18 2212 .  hydrALAZINE (APRESOLINE) injection 10 mg, 10 mg, Intravenous, Q8H PRN, Danford, Christopher P, MD .  HYDROmorphone (DILAUDID) injection 1 mg, 1 mg, Intravenous, Q2H PRN, Danford, Suann Larry, MD, 1 mg at 09/28/18 2228 .  latanoprost (XALATAN) 0.005 % ophthalmic solution 1 drop, 1 drop, Both Eyes, QHS, Blessings Inglett A, MD, 1 drop at 09/28/18 2213 .  methocarbamol (ROBAXIN) 500 mg in dextrose 5 % 50 mL IVPB, 500 mg, Intravenous, Q8H PRN, Danford, Suann Larry, MD .  metoprolol succinate (TOPROL-XL) 24 hr tablet 100 mg, 100 mg, Oral, Daily, Judith Part, MD, 100 mg at 09/27/18 2103 .  nystatin (MYCOSTATIN/NYSTOP) topical powder, , Topical, TID, Cuauhtemoc Huegel A, MD .  ondansetron (ZOFRAN) tablet 4 mg, 4 mg, Oral, Q6H PRN **OR** ondansetron (ZOFRAN) injection 4 mg, 4 mg, Intravenous, Q6H PRN, Posie Lillibridge A, MD .  oxyCODONE (Oxy IR/ROXICODONE) immediate release tablet 5 mg, 5 mg, Oral, Q4H PRN, Danford, Suann Larry, MD,  5 mg at 09/28/18 2117 .  pantoprazole (PROTONIX) EC tablet 40 mg, 40 mg, Oral, BID, Judith Part, MD, 40 mg at 09/28/18 2213 .  pramipexole (MIRAPEX) tablet 1 mg, 1 mg, Oral, QHS, Kella Splinter, Joyice Faster, MD, 1 mg at 09/28/18 2214 .  pravastatin (PRAVACHOL) tablet 40 mg, 40 mg, Oral, QHS, Mette Southgate, Joyice Faster, MD, 40 mg at 09/28/18 2213 .  saccharomyces boulardii (FLORASTOR) capsule 250 mg, 250 mg, Oral, Daily, Judith Part, MD, 250 mg at 09/27/18 2103 .  sodium chloride flush (NS) 0.9 % injection 3 mL, 3 mL, Intravenous, Q12H, Kweli Grassel, Joyice Faster, MD, 3 mL at 09/28/18 2214 .  sodium chloride flush (NS) 0.9 % injection 3 mL, 3 mL, Intravenous, PRN, Jaidalyn Schillo, Joyice Faster, MD .  vancomycin  (VANCOCIN) 1,000 mg in sodium chloride 0.9 % 250 mL IVPB, 1,000 mg, Intravenous, Q24H, Kris Mouton, [facility]   Physical Exam: Strength 5/5 in BUE, 4+/5 in BLE except left DF/EHL is 0/5, SILT except for diffuse distal left lower extremity numbness Incision c/d/i  Assessment & Plan: 78 y.o. woman with progressive severe back pain, urinary retention, and worsening ambulation 2/2 discitis / epidural abscess. 4/23 s/p T10-T12 decompressive laminectomies.   -strength improved post-op -intra-op Cx NGTD from epidural phlegmon. Pt on ABx but if organism still needed, could discuss with IR regarding aspiration of R psoas muscle or disc space for culture. No clear margin intra-operatively between phlegmon and dura/cord, could not safely be resected. -okay for DVT chemoprophylaxis on POD2 (09/30/18), can resume therapeutic anticoagulation on POD7 (10/05/18) unless she acquires a different indication for anticoagulation than PAF, at which time we can re-evaluate  Judith Part  09/29/18 7:35 AM

## 2018-09-29 NOTE — Evaluation (Signed)
Physical Therapy Evaluation Patient Details Name: [name] [name] MRN: [id number] DOB: 04-10-1941 Today's Date: 09/29/2018   History of Present Illness  Patient is a 78 y/o female who presents with back pain and confusion. Found to have discitis and epidural abscess s/p T9-T12 laminectomy for epidural abscess. PMH includes PD, CHF, chronic back pain, HTN, paroxysmal A-fib, recent cholecystectomy 07/07/18.  Clinical Impression  Patient presents with pain, generalized weakness, chronic left foot drop, lethargy, confusion, impaired attention and impaired problem solving s/p above. Pt reports being Mod I PTA for ADLs and ambulation and lives with spouse. Also reports having a supportive daughter who lives 1.5 mile away. Pt tolerated getting to EOB with Max A and step by step cues for sequencing. Also of note, pt with hx of PD. Attempted to stand however pt unable even with Max A. Education re: back precautions and log roll technique for comfort. Would benefit from SNF to maximize independence and mobility prior to return home.    Follow Up Recommendations SNF;Supervision for mobility/OOB;Supervision/Assistance - 24 hour    Equipment Recommendations  None recommended by PT    Recommendations for Other Services       Precautions / Restrictions Precautions Precautions: Fall;Back Precaution Booklet Issued: [name] Precaution Comments: Attempted to review back precautions Restrictions Weight Bearing Restrictions: [name]      Mobility  Bed Mobility Overal bed mobility: Needs Assistance Bed Mobility: Rolling;Sidelying to Sit;Sit to Sidelying Rolling: Mod assist Sidelying to sit: HOB elevated;Max assist     Sit to sidelying: Mod assist;HOB elevated General bed mobility comments: Step by step cues for log roll technique; able to reach for rai, assist with LEs, trunk and to scoot bottom to EOB. Difficulty with initiation and sequencing. Assist to bring LEs into bed to return to supine.    Transfers Overall transfer level: Needs assistance Equipment used: Rolling walker (2 wheeled) Transfers: Sit to/from Stand Sit to Stand: Max assist;From elevated surface         General transfer comment: Attempted to stand fromelevated bed height x2 however pt unable to Max A. Question full effort and attention.   Ambulation/Gait             General Gait Details: unable  Stairs            Wheelchair Mobility    Modified Rankin (Stroke Patients Only)       Balance Overall balance assessment: Needs assistance Sitting-balance support: Feet supported;Bilateral upper extremity supported Sitting balance-Leahy Scale: Poor Sitting balance - Comments: Requires BUe support in sitting, prefers right lateral lean on bed, abnle to get to midline with increased time and cues for positioning and holding onto RW.  Postural control: Right lateral lean     Standing balance comment: Unable                             Pertinent Vitals/Pain Pain Assessment: 0-10 Pain Score: 9  Pain Location: back Pain Descriptors / Indicators: Operative site guarding;Sore;Grimacing;Guarding Pain Intervention(s): Monitored during session;Repositioned;Limited activity within patient's tolerance    Home Living Family/patient expects to be discharged to:: Private residence Living Arrangements: Spouse/significant other Available Help at Discharge: Family Type of Home: House Home Access: Stairs to enter;Ramped entrance Entrance Stairs-Rails: Right Entrance Stairs-Number of Steps: 4 Home Layout: One level Home Equipment: Clinical cytogeneticist - 2 wheels;Walker - 4 wheels;Cane - single point Additional Comments: Husband does have mild dementia. Daughter lives 1 mile away    Prior  Function Level of Independence: Independent with assistive device(s)         Comments: uses RW inside, Rollator outside. Does some cooking.     Hand Dominance   Dominant Hand: Right     Extremity/Trunk Assessment   Upper Extremity Assessment Upper Extremity Assessment: Defer to OT evaluation    Lower Extremity Assessment Lower Extremity Assessment: LLE deficits/detail LLE Deficits / Details: chronic left foot drop per chart, 1/5 DF LLE Sensation: decreased light touch    Cervical / Trunk Assessment Cervical / Trunk Assessment: Other exceptions;Kyphotic Cervical / Trunk Exceptions: s/p spine surgery  Communication   Communication: [name] difficulties  Cognition Arousal/Alertness: Lethargic;Suspect due to medications Behavior During Therapy: Flat affect Overall Cognitive Status: Impaired/Different from baseline Area of Impairment: Orientation;Problem solving;Following commands;Attention                 Orientation Level: Disoriented to;Situation Current Attention Level: Sustained   Following Commands: Follows one step commands with increased time     Problem Solving: Slow processing;Decreased initiation;Difficulty sequencing;Requires verbal cues;Requires tactile cues General Comments: increased time to follow commands with repetition; slow to respond. perseverating on people lying to her, "everyone lies to me" "are you going to lie too?" Flat affect but jokes. not sure why she is here. Knows it is April 2020.      General Comments      Exercises     Assessment/Plan    PT Assessment Patient needs continued PT services  PT Problem List Decreased strength;Decreased balance;Decreased cognition;Decreased knowledge of precautions;Pain;Decreased mobility;Decreased activity tolerance;Decreased skin integrity;Impaired sensation       PT Treatment Interventions Functional mobility training;Patient/family education;Balance training;DME instruction;Gait training;Therapeutic activities;Therapeutic exercise;Cognitive remediation    PT Goals (Current goals can be found in the Care Plan section)  Acute Rehab PT Goals Patient Stated Goal: none stated PT Goal  Formulation: With patient Time For Goal Achievement: 10/13/18 Potential to Achieve Goals: Fair    Frequency Min 3X/week   Barriers to discharge Decreased caregiver support not sure level of support at home    Co-evaluation               AM-PAC PT "6 Clicks" Mobility  Outcome Measure Help needed turning from your back to your side while in a flat bed without using bedrails?: A Lot Help needed moving from lying on your back to sitting on the side of a flat bed without using bedrails?: A Lot Help needed moving to and from a bed to a chair (including a wheelchair)?: Total Help needed standing up from a chair using your arms (e.g., wheelchair or bedside chair)?: Total Help needed to walk in hospital room?: Total Help needed climbing 3-5 steps with a railing? : Total 6 Click Score: 8    End of Session Equipment Utilized During Treatment: Gait belt Activity Tolerance: Patient limited by pain;Patient limited by lethargy;Other (comment)(cognition) Patient left: in bed;with call bell/phone within reach;with bed alarm set;with SCD's reapplied Nurse Communication: Mobility status PT Visit Diagnosis: Pain;Muscle weakness (generalized) (M62.81);Difficulty in walking, not elsewhere classified (R26.2);Unsteadiness on feet (R26.81) Pain - part of body: (back)    Time: 7829-5621 PT Time Calculation (min) (ACUTE ONLY): 35 min   Charges:   PT Evaluation $PT Eval Moderate Complexity: 1 Mod PT Treatments $Therapeutic Activity: 8-22 mins        Wray Kearns, PT, DPT Acute Rehabilitation Services Pager [phone] Office [phone]      [name] 09/29/2018, 12:36 PM

## 2018-09-29 NOTE — Progress Notes (Signed)
PROGRESS NOTE    [name]  VPX:[id number] DOB: 1941/02/21 DOA: 09/27/2018 PCP: [name] Arabian, MD      Brief Narrative:  [name] is a 78 y.o. 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  Patient had developed acute on chronic back pain around beginning of January.  Was admitted 3 months ago, had MRI that showed discitis, but this was not biopsied and clinically did not appear to be discitis, and so she was discharged.  In the interim, her symptoms did not improve, and so she was referred back to Neurosurgery, who obtained an MRI that showed what appeared to be worsening discitis with epidural abscess, so she was sent to the ER.  In the ER, she was afebrile, HR and RR normal.  ANtibiotics were held in discussion with Neurosurgery, and she was admitted to the hospitalist service for laminectomy.       Assessment & Plan:  Epidural abscess S/p T9-12 laminectomy for epidural abscess by Dr. Zada Finders on 4/62, uncomplicated Interestingly, CRP and ESR are still normal and at no point has she had fever.  To the OR 4/23.  Cultures sent, gram stain negative.  Unfortunately, no biopsy sent.  BCx NGTD.  Intraop culture negative. -Continue vancomycin and ceftriaxone -Daily BMP -Follow culture data -Continue acetaminophen, oxycodone, IV hydromrophoen for pain   Possible superficial hematoma Per Dr. Colleen Can note, she may have a right sided swelling superficially, possibly a hematoma.  Her Hgb is stable, and she has no new neuropathy, so we will continue to monitor this.  Acute metabolic encephaloapthy This was reported to be subacute in last week or two.  Reportedly is global "confusion" and "can't use phone right" but without focal neurological deficits (other than those related to ?cord compression above).    Here, she remains somewhat sedated from medicines, but oriented to [name], recalls surgery yesterday, oriented to her nurse and me. -Treat underlying infection -Minimize opiates Delirium precautions:   -Lights and TV off, minimize interruptions at night  -Blinds open and lights on during day  -Glasses/hearing aid with patient  -Frequent reorientation  -PT/OT when able  -Avoid sedation medications/Beers list medications    Parkinson's disease -Continue Sinemet, Mirapex  Atrial fibrillation, paroxysmal INR reversed for surgery.  CHA2DS2-Vasc 4 only. -Continue metoporlol, flecainide -Hold warfarin 24 more hours, then will discuss with NSG and restart without bridge  Chronic diastolic CHF Appears euvolemic, not on baseline diuretics. -Continue metoprolol, statin  Other medicaitons -Continue PPI -Continue gabapentin      MDM and disposition: The below labs and imaging reports were reviewed and summarized above.  Medication management as above including anticoagulation management.  The patient was admitted with MRI findings of epidural abscess with possible cord compression, which is an acute illness with threat to bodily function.  The hospitalist service were asked to admit for neurosurgery who took the patient to the OR 4/23 for laminectomy, which appears to be uncomplicated.         DVT prophylaxis: SCDs Code Status: FULL Family Communication: Atempted to reach daughter by phone, left VM    Consultants:   Neurosurgery  Procedures:   T9-12 laminectomy with cultures  Antimicrobials:   Vancomycin 4/23 >>  Ceftriaxone 4/23 >>    Subjective: Back pain persists, no leg swelling, no leg weakness, no numbness.  No change, chronic left foot drop.  No fever, vomiting, diarrhea.  Objective: Vitals:  09/28/18 2137 09/29/18 0306 09/29/18 0509 09/29/18 1415  BP: 130/68 (!) 133/54 (!) 134/54 (!) 146/54  Pulse: 86 65 61 69  Resp: 18 20 20 20  Temp: 97.6 F (36.4 C) 97.6 F (36.4 C) 98.2 F (36.8 C) 98 F (36.7 C)   TempSrc: Oral Oral Oral Oral  SpO2: 99% 99% 99% 100%  Weight:      Height:       No intake or output data in the 24 hours ending 09/29/18 1700 Filed Weights   09/27/18 1409 09/27/18 1852  Weight: 83 kg 81.4 kg    Examination: General appearance: Older adult female, lying in bed, no acute distress, appears sedated. HEENT: Anicteric, conjunctival pink, lids and lashes normal.  No nasal deformity, discharge, or epistaxis.  Lips dry, dentition normal, oropharynx moist, no oral lesions. Skin: Skin warm and dry, no jaundice.  The surgical site is covered, no  oozing, drainage. Cardiac: Regular rate and rhythm, no murmurs, no lower extremity edema. Respiratory: Normal respiratory rate and rhythm, lungs clear without rales or wheezes. Abdomen: Abdomen soft, no tenderness palpation, no ascites or distention. Neuro: Awake, somewhat sleepy from his pain medicine.  Moves upper extremities with global weakness, slow coordination.  Speech fluent.   Leg strength appears symmetric, 4/5 bilaterally.  No leg numbness.  Chronic left foot drop. Psych: Sleepy from pain medicine.  Affect blunted.  Judgment and insight appear moderately impaired.  Oriented to person, place, time, and situation.    Data Reviewed: I have personally reviewed following labs and imaging studies:  CBC: Recent Labs  Lab 09/27/18 1425 09/28/18 0323 09/29/18 0242  WBC 12.0* 8.2 12.3*  NEUTROABS 10.6*  --   --   HGB 13.0 12.4 12.6  HCT 41.5 38.9 39.1  MCV 86.5 85.1 85.4  PLT 171 148* 154   Basic Metabolic Panel: Recent Labs  Lab 09/27/18 1425 09/27/18 1907 09/28/18 0323 09/29/18 0242  NA 141 139 140 139  K 5.2* 4.5 4.0 4.2  CL 106 103 104 104  CO2 25 26 27 25  GLUCOSE 128* 113* 92 114*  BUN 30* 25* 27* 25*  CREATININE 1.22* 1.03* 1.07* 0.89  CALCIUM 9.3 9.3 9.2 8.8*   GFR: Estimated Creatinine Clearance: 57.6 mL/min (by C-G formula based on SCr of 0.89 mg/dL). Liver Function Tests: Recent Labs  Lab 09/27/18  1425 09/28/18 0323  AST 23 20  ALT 14 7  ALKPHOS 89 88  BILITOT 1.1 1.1  PROT 5.8* 5.9*  ALBUMIN 3.5 3.5   No results for input(s): LIPASE, AMYLASE in the last 168 hours. No results for input(s): AMMONIA in the last 168 hours. Coagulation Profile: Recent Labs  Lab 09/27/18 1907 09/28/18 0704 09/29/18 0242  INR 3.4* 2.6* 1.2   Cardiac Enzymes: No results for input(s): CKTOTAL, CKMB, CKMBINDEX, TROPONINI in the last 168 hours. BNP (last 3 results) No results for input(s): PROBNP in the last 8760 hours. HbA1C: No results for input(s): HGBA1C in the last 72 hours. CBG: No results for input(s): GLUCAP in the last 168 hours. Lipid Profile: No results for input(s): CHOL, HDL, LDLCALC, TRIG, CHOLHDL, LDLDIRECT in the last 72 hours. Thyroid Function Tests: No results for input(s): TSH, T4TOTAL, FREET4, T3FREE, THYROIDAB in the last 72 hours. Anemia Panel: No results for input(s): VITAMINB12, FOLATE, FERRITIN, TIBC, IRON, RETICCTPCT in the last 72 hours. Urine analysis:    Component Value Date/Time   COLORURINE YELLOW 07/01/2018 0628   APPEARANCEUR CLEAR 07/01/2018 0628   LABSPEC 1.023 07/01/2018 0628     PHURINE 5.0 07/01/2018 0628   GLUCOSEU NEGATIVE 07/01/2018 0628   HGBUR MODERATE (A) 07/01/2018 0628   BILIRUBINUR NEGATIVE 07/01/2018 0628   KETONESUR 5 (A) 07/01/2018 0628   PROTEINUR NEGATIVE 07/01/2018 0628   UROBILINOGEN 1.0 06/26/2010 1009   NITRITE NEGATIVE 07/01/2018 0628   LEUKOCYTESUR TRACE (A) 07/01/2018 0628   Sepsis Labs: @LABRCNTIP(procalcitonin:4,lacticacidven:4)  ) Recent Results (from the past 240 hour(s))  Blood culture (routine x 2)     Status: None (Preliminary result)   Collection Time: 09/27/18  2:15 PM  Result Value Ref Range Status   Specimen Description BLOOD LEFT ANTECUBITAL  Final   Special Requests   Final    BOTTLES DRAWN AEROBIC AND ANAEROBIC Blood Culture adequate volume   Culture   Final    NO GROWTH 2 DAYS Performed at [name], [address] [place], [place] [address]    Report Status PENDING  Incomplete  Blood culture (routine x 2)     Status: None (Preliminary result)   Collection Time: 09/27/18  3:00 PM  Result Value Ref Range Status   Specimen Description BLOOD RIGHT ANTECUBITAL  Final   Special Requests   Final    BOTTLES DRAWN AEROBIC AND ANAEROBIC Blood Culture adequate volume   Culture   Final    NO GROWTH 2 DAYS Performed at [name], [address] [place], [place] [address]    Report Status PENDING  Incomplete  Surgical pcr screen     Status: None   Collection Time: 09/28/18  3:27 AM  Result Value Ref Range Status   MRSA, PCR NEGATIVE NEGATIVE Final   Staphylococcus aureus NEGATIVE NEGATIVE Final    Comment: (NOTE) The Xpert SA Assay (FDA approved for NASAL specimens in patients 22 years of age and older), is one component of a comprehensive surveillance program. It is not intended to diagnose infection nor to guide or monitor treatment. Performed at [name], [address] [place], [place] [address]   Aerobic/Anaerobic Culture (surgical/deep wound)     Status: None (Preliminary result)   Collection Time: 09/28/18 12:13 PM  Result Value Ref Range Status   Specimen Description ABSCESS DORSAL EPIDURAL  Final   Special Requests NONE  Final   Gram Stain   Final    RARE WBC PRESENT, PREDOMINANTLY PMN NO ORGANISMS SEEN    Culture   Final    NO GROWTH < 24 HOURS Performed at [name], [address] [place],  [address]    Report Status PENDING  Incomplete         Radiology Studies: Dg Thoracolumabar Spine  Result Date: 09/28/2018 CLINICAL DATA:  Localization image. Limited ectomy for epidural abscess. EXAM: THORACOLUMBAR SPINE 1V COMPARISON:  None. FINDINGS: Single lateral intraoperative fluoroscopic images provided. Based on the lumbar spine MRI of 09/27/2018, surgical instruments overlie the posterior elements of the T12 vertebral  body. Fluoroscopy provided for 7 seconds. IMPRESSION: Surgical instruments overlying the posterior elements of the T12 vertebral body. Electronically Signed   By: Stan  Maynard M.D.   On: 09/28/2018 14:17   Dg C-arm 1-60 Min  Result Date: 09/28/2018 CLINICAL DATA:  Localization image. Limited ectomy for epidural abscess. EXAM: THORACOLUMBAR SPINE 1V COMPARISON:  None. FINDINGS: Single lateral intraoperative fluoroscopic images provided. Based on the lumbar spine MRI of 09/27/2018, surgical instruments overlie the posterior elements of the T12 vertebral body. Fluoroscopy provided for 7 seconds. IMPRESSION: Surgical instruments overlying the posterior elements of the   T12 vertebral body. Electronically Signed   By: Franki Cabot M.D.   On: 09/28/2018 14:17        Scheduled Meds: . acetaminophen  1,000 mg Oral Q8H  . carbidopa-levodopa  2 tablet Oral TID  . docusate sodium  100 mg Oral BID  . flecainide  50 mg Oral BID  . gabapentin  300 mg Oral BID  . latanoprost  1 drop Both Eyes QHS  . metoprolol succinate  100 mg Oral Daily  . nystatin   Topical TID  . pantoprazole  40 mg Oral BID  . pramipexole  1 mg Oral QHS  . pravastatin  40 mg Oral QHS  . saccharomyces boulardii  250 mg Oral Daily  . sodium chloride flush  3 mL Intravenous Q12H   Continuous Infusions: . sodium chloride    . cefTRIAXone (ROCEPHIN)  IV 2 g (09/28/18 2209)  . vancomycin       LOS: 2 days    Time spent: 25 minutes    Edwin Dada, MD [facility] 09/29/2018, 5:00 PM     Please page through [place]:  [email or web address] Password [email or web address] If 7PM-7AM, please contact night-coverage

## 2018-09-29 NOTE — TOC Initial Note (Signed)
Transition of Care [facility]) - Initial/Assessment Note    Patient Details  Name: [name] MRN: [id number] Date of Birth: 1940/09/23  Transition of Care [facility]) CM/SW Contact:    Sharin Mons, RN Phone Number: 09/29/2018, 11:27 AM  Clinical Narrative:   Admitted with epidural abscess. Hx of  CHF, DJD, lumbar stenosis, paroxysmal A. Fib, Parkinson disease,  s/p  laparoscopic cholecystectomy, 07/07/2018.  From home with husband. Husband states prior to admit independent with ADL's. DME : cane , walker. Supportive family.           Pandora Leiter (Spouse) Suzi Roots (Daughter)     [phone]     PT evaluation pending ....NCM following for [facility] NEEDS  Expected Discharge Plan: [place] Barriers to Discharge: Continued Medical Work up   Patient Goals and CMS Choice Patient states their goals for this hospitalization and ongoing recovery are:: to go home CMS Medicare.gov Compare Post Acute Care list provided to:: Patient    Expected Discharge Plan and Services Expected Discharge Plan: [place] In-house Referral: Clinical Social Work Discharge Planning Services: CM Consult   Living arrangements for the past 2 months: [place]                 DME Arranged: N/A DME Agency: NA                  Prior Living Arrangements/Services Living arrangements for the past 2 months: Single Family Home Lives with:: Spouse Patient language and need for interpreter reviewed:: Yes Do you feel safe going back to the place where you live?: Yes      Need for Family Participation in Patient Care: No (Comment) Care giver support system in place?: Yes (comment) Current home services: DME(walker,cane) Criminal Activity/Legal Involvement Pertinent to Current Situation/Hospitalization: No - Comment as needed  Activities of Daily Living      Permission Sought/Granted Permission sought to share information with : Case  Manager, Family Supports Permission granted to share information with : Yes, Verbal Permission Granted  Share Information with NAME: RAYNETTA OSTERLOH (Spouse)Kimberly Owens Shark (Daughter)           Emotional Assessment Appearance:: Appears stated age Attitude/Demeanor/Rapport: Engaged Affect (typically observed): Accepting Orientation: : Oriented to Place, Oriented to Self, Oriented to  Time, Oriented to Situation Alcohol / Substance Use: Not Applicable Psych Involvement: No (comment)  Admission diagnosis:  Epidural abscess [G06.2] Discitis of thoracolumbar region [M46.45] Patient Active Problem List   Diagnosis Date Noted  . 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 Abnormal glucose tolerance test 07/01/2018  . Colon, diverticulosis 07/01/2018  . 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 Glaucoma 07/01/2018  . Chronic kidney disease (CKD), stage III (moderate) ([place]) 07/01/2018  . Neuropathy 07/01/2018  . Pure hypercholesterolemia 07/01/2018  . Restless leg 07/01/2018  . Discitis of thoracolumbar region 07/01/2018  . Acute bronchitis due to infection 05/04/2017  . Cough 04/21/2016  . Preoperative cardiovascular examination 04/18/2015  . Parkinsonism ([place]) 05/15/2014  . Chronic anticoagulation -warfarin therapy 02/03/2014  . Atrial fibrillation with RVR ([place]) 01/31/2014  . Gait instability 11/16/2013  . Encounter for therapeutic drug monitoring 07/20/2013  . Edema 03/03/2010  . ATRIAL FIBRILLATION 04/23/2008  . Elevated lipids 01/31/2008  . Obstructive sleep apnea 01/31/2008  . GLAUCOMA 01/30/2008  . Seasonal and perennial allergic rhinitis 01/30/2008  . Osteoarthritis 01/30/2008   PCP:  [name], MD Pharmacy:   [facility] [place], [place]  [place] [address] Phone: [phone] Fax: [phone]  [facility] [place], [place] [facility] AT [address] [place]  [address] Phone: [phone] Fax: [phone]  [facility] [place], [place] [address] Phone: [phone] Fax: [phone]  [facility] [phone] - [place], [place] - [address] AT [place] [address] Phone: [phone] Fax: [phone]     Social Determinants of Health ([place]) Interventions    Readmission Risk Interventions No flowsheet data found.

## 2018-09-30 DIAGNOSIS — Z9049 Acquired absence of other specified parts of digestive tract: Secondary | ICD-10-CM

## 2018-09-30 DIAGNOSIS — M1990 Unspecified osteoarthritis, unspecified site: Secondary | ICD-10-CM

## 2018-09-30 DIAGNOSIS — M4645 Discitis, unspecified, thoracolumbar region: Secondary | ICD-10-CM

## 2018-09-30 DIAGNOSIS — G062 Extradural and subdural abscess, unspecified: Secondary | ICD-10-CM

## 2018-09-30 DIAGNOSIS — I509 Heart failure, unspecified: Secondary | ICD-10-CM

## 2018-09-30 DIAGNOSIS — Z7901 Long term (current) use of anticoagulants: Secondary | ICD-10-CM

## 2018-09-30 DIAGNOSIS — I480 Paroxysmal atrial fibrillation: Secondary | ICD-10-CM

## 2018-09-30 LAB — BASIC METABOLIC PANEL
Anion gap: 8 (ref 5–15)
BUN: 23 mg/dL (ref 8–23)
CO2: 24 mmol/L (ref 22–32)
Calcium: 8.4 mg/dL — ABNORMAL LOW (ref 8.9–10.3)
Chloride: 105 mmol/L (ref 98–111)
Creatinine, Ser: 0.88 mg/dL (ref 0.44–1.00)
GFR calc Af Amer: >60 mL/min (ref >60)
GFR calc non Af Amer: >60 mL/min (ref >60)
Glucose, Bld: 100 mg/dL — ABNORMAL HIGH (ref 70–99)
Potassium: 3.6 mmol/L (ref 3.5–5.1)
Sodium: 137 mmol/L (ref 135–145)

## 2018-09-30 LAB — CBC
HCT: 37.2 % (ref 36.0–46.0)
Hemoglobin: 11.7 g/dL — ABNORMAL LOW (ref 12.0–15.0)
MCH: 27.4 pg (ref 26.0–34.0)
MCHC: 31.5 g/dL (ref 30.0–36.0)
MCV: 87.1 fL (ref 80.0–100.0)
Platelets: 119 10*3/uL — ABNORMAL LOW (ref 150–400)
RBC: 4.27 MIL/uL (ref 3.87–5.11)
RDW: 15.4 % (ref 11.5–15.5)
WBC: 10 10*3/uL (ref 4.0–10.5)
nRBC: 0 % (ref 0.0–0.2)

## 2018-09-30 MED ORDER — ENOXAPARIN SODIUM 40 MG/0.4ML ~~LOC~~ SOLN
40.0000 mg | SUBCUTANEOUS | Status: DC
  Administered 2018-10-01 – 2018-10-03 (×3): 40 mg via SUBCUTANEOUS
  Filled 2018-09-30 (×4): qty 0.4

## 2018-09-30 NOTE — Progress Notes (Signed)
PROGRESS NOTE    [name]  WVP:[id number] DOB: 01/23/1941 DOA: 09/27/2018 PCP: [name], MD      Brief Narrative:  [name] is a 78 y.o. 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  Patient had developed acute on chronic back pain around beginning of January.  Was admitted 3 months ago, had MRI that showed discitis, but this was not biopsied and clinically did not appear to be discitis, and so she was discharged.  In the interim, her symptoms did not improve, and so she was referred back to Neurosurgery, who obtained an MRI that showed what appeared to be worsening discitis with epidural abscess, so she was sent to the ER.  In the ER, she was afebrile, HR and RR normal.  ANtibiotics were held in discussion with Neurosurgery, and she was admitted to the hospitalist service for laminectomy.       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 Zada Finders on 5/46, uncomplicated.  CRP and ESR are still normal and at no point has she had fever.  To the OR 4/23.  Intraop and BCx NGTD.   Pain persists, but tolerable and up to chair today. -Continue vancomycin and ceftriaxone -Daily BMP -Follow culture data -Continue acetaminophen, oxycodone, IV hydromrophoen for pain -Consult infectious disease, appreciate cares -Plan for PICC in 6 weeks IV antibiotics   Acute metabolic encephaloapthy Suspect slow metabolizer of anesthesia.  Strength good in bilateral UEs, bilatearl LEs.  Reported urinary retention to surgery. -Minimize opiates Delirium precautions:   -Lights and TV off, minimize interruptions at night  -Blinds open and lights on during day  -Glasses/hearing aid with patient  -Frequent reorientation  -PT/OT when able  -Avoid sedation medications/[place] list medications    Parkinson's disease -Continue Sinemet,  Mirapex  Atrial fibrillation, paroxysmal INR reversed for surgery.  CHA2DS2-Vasc 4 only. -Continue metoporlol, flecainide -Restart warfarin on 2/70  Chronic diastolic CHF Euvolemic -Continue metoprolol, statin  Other medicaitons -Continue PPI -Continue gabapentin      MDM and disposition: The below labs and imaging reports reviewed and summarized above.  Medication management as above.  Including anticoagulation.  Case discussed with infectious disease.  The patient was admitted with MRI findings of epidural abscess.  She underwent laminectomy on 4/23 without event.  Postoperatively she is doing well, without complications.          DVT prophylaxis: Lovenox Code Status: FULL Family Communication: Daughter by phone    Consultants:  Neurosurgery ID   Procedures:   T9-12 laminectomy with cultures  Antimicrobials:   Vancomycin 4/23 >>  Ceftriaxone 4/23 >>    Subjective: No fever overnight, no new focal weakness.  Some anterior left leg pain/numbness.  No vomiting, diarrhea, swelling, headache, neck pain, confusion, agitation, dyspnea.  Objective: Vitals:   09/30/18 0300 09/30/18 0639 09/30/18 0904 09/30/18 1519  BP:  (!) 145/55 (!) 122/53 131/74  Pulse:  66 70 79  Resp:  '18 18 20  ' Temp:  97.7 F (36.5 C) 97.6 F (36.4 C) 98.1 F (36.7 C)  TempSrc:  Oral Oral Oral  SpO2:  98% 100% 96%  Weight: 84.5 kg     Height:        Intake/Output Summary (Last 24 hours) at 09/30/2018 1537 Last data filed at 09/30/2018 0555 Gross per 24 hour  Intake 577.5 ml  Output 755 ml  Net -177.5  ml   Filed Weights   09/27/18 1409 09/27/18 1852 09/30/18 0300  Weight: 83 kg 81.4 kg 84.5 kg    Examination: General appearance: Older adult female, sitting in recliner, psychomotor slowing noted. HEENT: Anicteric, conjunctival pink, lids and lashes normal.  No nasal deformity, discharge, or epistaxis.  Lips dry, dentition normal, oropharynx moist, no oral lesions.  Skin: Skin is warm and dry, no jaundice.  No bruising or drainage in her surgical dressing. Cardiac: Regular rate and rhythm, no murmurs, no lower extremity edema. Respiratory: Normal respiratory rate and rhythm, lungs clear without rales or wheezes.  Abdomen: Abdomen soft without tenderness to palpation, ascites or distention. Neuro: Awake, psychomotor slowing noted.  Extraocular movements intact, interactive and oriented to person, place, time, and situation.  Strength 5/5 in upper and lower extremities bilaterally.  Chronic left foot drop, speech fluent. Psych: Psychomotor slowing noted, affect blunted, judgment and insight appear slightly impaired.   Data Reviewed: I have personally reviewed following labs and imaging studies:  CBC: Recent Labs  Lab 09/27/18 1425 09/28/18 0323 09/29/18 0242 09/30/18 0327  WBC 12.0* 8.2 12.3* 10.0  NEUTROABS 10.6*  --   --   --   HGB 13.0 12.4 12.6 11.7*  HCT 41.5 38.9 39.1 37.2  MCV 86.5 85.1 85.4 87.1  PLT 171 148* 154 062*   Basic Metabolic Panel: Recent Labs  Lab 09/27/18 1425 09/27/18 1907 09/28/18 0323 09/29/18 0242 09/30/18 0327  NA 141 139 140 139 137  K 5.2* 4.5 4.0 4.2 3.6  CL 106 103 104 104 105  CO2 '25 26 27 25 24  ' GLUCOSE 128* 113* 92 114* 100*  BUN 30* 25* 27* 25* 23  CREATININE 1.22* 1.03* 1.07* 0.89 0.88  CALCIUM 9.3 9.3 9.2 8.8* 8.4*   GFR: Estimated Creatinine Clearance: 59.3 mL/min (by C-G formula based on SCr of 0.88 mg/dL). Liver Function Tests: Recent Labs  Lab 09/27/18 1425 09/28/18 0323  AST 23 20  ALT 14 7  ALKPHOS 89 88  BILITOT 1.1 1.1  PROT 5.8* 5.9*  ALBUMIN 3.5 3.5   No results for input(s): LIPASE, AMYLASE in the last 168 hours. No results for input(s): AMMONIA in the last 168 hours. Coagulation Profile: Recent Labs  Lab 09/27/18 1907 09/28/18 0704 09/29/18 0242  INR 3.4* 2.6* 1.2   Cardiac Enzymes: No results for input(s): CKTOTAL, CKMB, CKMBINDEX, TROPONINI in the last 168 hours.  BNP (last 3 results) No results for input(s): PROBNP in the last 8760 hours. HbA1C: No results for input(s): HGBA1C in the last 72 hours. CBG: No results for input(s): GLUCAP in the last 168 hours. Lipid Profile: No results for input(s): CHOL, HDL, LDLCALC, TRIG, CHOLHDL, LDLDIRECT in the last 72 hours. Thyroid Function Tests: No results for input(s): TSH, T4TOTAL, FREET4, T3FREE, THYROIDAB in the last 72 hours. Anemia Panel: No results for input(s): VITAMINB12, FOLATE, FERRITIN, TIBC, IRON, RETICCTPCT in the last 72 hours. Urine analysis:    Component Value Date/Time   COLORURINE YELLOW 07/01/2018 0628   APPEARANCEUR CLEAR 07/01/2018 0628   LABSPEC 1.023 07/01/2018 0628   PHURINE 5.0 07/01/2018 0628   GLUCOSEU NEGATIVE 07/01/2018 0628   HGBUR MODERATE (A) 07/01/2018 0628   BILIRUBINUR NEGATIVE 07/01/2018 0628   KETONESUR 5 (A) 07/01/2018 0628   PROTEINUR NEGATIVE 07/01/2018 0628   UROBILINOGEN 1.0 06/26/2010 1009   NITRITE NEGATIVE 07/01/2018 0628   LEUKOCYTESUR TRACE (A) 07/01/2018 0628   Sepsis Labs: '@LABRCNTIP' (procalcitonin:4,lacticacidven:4)  ) Recent Results (from the past 240 hour(s))  Blood culture (  routine x 2)     Status: None (Preliminary result)   Collection Time: 09/27/18  2:15 PM  Result Value Ref Range Status   Specimen Description BLOOD LEFT ANTECUBITAL  Final   Special Requests   Final    BOTTLES DRAWN AEROBIC AND ANAEROBIC Blood Culture adequate volume   Culture   Final    NO GROWTH 3 DAYS Performed at [facility], [address], [place], [place] [address]    Report Status PENDING  Incomplete  Blood culture (routine x 2)     Status: None (Preliminary result)   Collection Time: 09/27/18  3:00 PM  Result Value Ref Range Status   Specimen Description BLOOD RIGHT ANTECUBITAL  Final   Special Requests   Final    BOTTLES DRAWN AEROBIC AND ANAEROBIC Blood Culture adequate volume   Culture   Final    NO GROWTH 3 DAYS Performed at [facility], [place] [address], [place], [place] [address]    Report Status PENDING  Incomplete  Surgical pcr screen     Status: None   Collection Time: 09/28/18  3:27 AM  Result Value Ref Range Status   MRSA, PCR NEGATIVE NEGATIVE Final   Staphylococcus aureus NEGATIVE NEGATIVE Final    Comment: (NOTE) The Xpert SA Assay (FDA approved for NASAL specimens in patients 75 years of age and older), is one component of a comprehensive surveillance program. It is not intended to diagnose infection nor to guide or monitor treatment. Performed at [facility], [place] [address] [place], [place] [address]   Aerobic/Anaerobic Culture (surgical/deep wound)     Status: None (Preliminary result)   Collection Time: 09/28/18 12:13 PM  Result Value Ref Range Status   Specimen Description ABSCESS DORSAL EPIDURAL  Final   Special Requests NONE  Final   Gram Stain   Final    RARE WBC PRESENT, PREDOMINANTLY PMN NO ORGANISMS SEEN    Culture   Final    NO GROWTH 2 DAYS NO ANAEROBES ISOLATED; CULTURE IN PROGRESS FOR 5 DAYS Performed at [facility] [address], [place], [place] [address]    Report Status PENDING  Incomplete         Radiology Studies: No results found.      Scheduled Meds: . acetaminophen  1,000 mg Oral Q8H  . carbidopa-levodopa  2 tablet Oral TID  . docusate sodium  100 mg Oral BID  . flecainide  50 mg Oral BID  . gabapentin  300 mg Oral BID  . latanoprost  1 drop Both Eyes QHS  . metoprolol succinate  100 mg Oral Daily  . nystatin   Topical TID  . pantoprazole  40 mg Oral BID  . pramipexole  1 mg Oral QHS  . pravastatin  40 mg Oral QHS  . saccharomyces boulardii  250 mg Oral Daily  . sodium chloride flush  3 mL Intravenous Q12H   Continuous Infusions: . sodium chloride    . cefTRIAXone (ROCEPHIN)  IV Stopped (09/29/18 2209)  . vancomycin Stopped (09/29/18 1941)     LOS: 3 days    Time spent: 35 minutes    Edwin Dada, MD [facility] 09/30/2018, 3:37 PM     Please page through Gem:  [email or web address] Password [email or web address] If 7PM-7AM, please contact night-coverage

## 2018-09-30 NOTE — Consult Note (Signed)
[facility]    Date of Admission:  09/27/2018   Total days of antibiotics: 2 ceftriaxone/1 vancomycin               Reason for Consult: Disicitis    Referring Provider: Danford   Assessment: Discitis, abscess  Plan: 1. Continue her current nabx 2. Await her Op Cx, ngtd at 28h 3. Plan for pic when she is amenable 4. Plan for 6 weeks of IV anbx 5. Appreciate pharm assistance with mgmt of vanco levels 6. Watch Cr on vanco   Thank you so much for this interesting consult,  Active Problems:   ATRIAL FIBRILLATION   Chronic anticoagulation -warfarin therapy   Parkinsonism (HCC)   Chronic kidney disease (CKD), stage III (moderate) (HCC)   Discitis of thoracolumbar region   Epidural abscess   . acetaminophen  1,000 mg Oral Q8H  . carbidopa-levodopa  2 tablet Oral TID  . docusate sodium  100 mg Oral BID  . flecainide  50 mg Oral BID  . gabapentin  300 mg Oral BID  . latanoprost  1 drop Both Eyes QHS  . metoprolol succinate  100 mg Oral Daily  . nystatin   Topical TID  . pantoprazole  40 mg Oral BID  . pramipexole  1 mg Oral QHS  . pravastatin  40 mg Oral QHS  . saccharomyces boulardii  250 mg Oral Daily  . sodium chloride flush  3 mL Intravenous Q12H    HPI: [name] is a 78 y.o. female with hx of CHF, DJD, parox afib, recent lap-chole 06-2018.  She had back pain at that time showing discitis at T12-L1 (07-01-18).  Her inflammatory markers were (-) and she was not treated with anbx. She continued to have back pain and was eval by neurosurgery. She had repeat MRI which showed abscess, diskitis.  She returned to hospital on 4-22 with worsening back pain and underwent MRI: Findings consistent with discitis and osteomyelitis at T12-L1. Large epidural abscess extends from the T12-L1 disc space to the T9-10 disc space. There is cord compression at T11 and T12 due to the large abscess. Paraspinous soft tissue infection and early  psoas abscess bilaterally at T12-L1.  She went to OR on 4-23 and underwent T10-12 laminectomy and drainage of abscess.  She has been afebrile, WBC max 12.3, CRP 1, ESR 2  States her prev abd wound has healed.  She c/o continued back pain.   Review of Systems: Review of Systems  Constitutional: Negative for chills and fever.  Gastrointestinal: Negative for abdominal pain, constipation and diarrhea.  Genitourinary: Negative for dysuria.  Neurological: Negative for sensory change.  Please see HPI. All other systems reviewed and negative.   Past Medical History:  Diagnosis Date  . Allergic rhinitis   . Arthritis    "all over"  . CHF (congestive heart failure) ([place])   . Chronic back pain   . DJD (degenerative joint disease)   . GERD (gastroesophageal reflux disease)   . Hyperlipidemia   . Hypertension   . Lumbar stenosis   . OSA (obstructive sleep apnea)    "couldn't handle CPAP; use mouth guard some; not all the time" (01/06/2014)  . PAF (paroxysmal atrial fibrillation) ([place])   . Scoliosis    with radiculopathy L2-S1 with prior surgery  . Small bowel obstruction (HCC)    versus ileus after last bck surgery  . Spondylosis     Social  History   Tobacco Use  . Smoking status: Never Smoker  . Smokeless tobacco: Never Used  Substance Use Topics  . Alcohol use: No    Alcohol/week: 0.0 standard drinks    Comment: 02/01/2014 " glasseof wine once in a blue moon; < once/year"  . Drug use: No    Family History  Problem Relation Age of Onset  . Arthritis Mother   . Heart attack Father   . Hypertension Sister   . Lung cancer Sister   . Asthma Sister   . Allergies Sister      Medications:  Scheduled: . acetaminophen  1,000 mg Oral Q8H  . carbidopa-levodopa  2 tablet Oral TID  . docusate sodium  100 mg Oral BID  . flecainide  50 mg Oral BID  . gabapentin  300 mg Oral BID  . latanoprost  1 drop Both Eyes QHS  . metoprolol succinate  100 mg Oral Daily  . nystatin    Topical TID  . pantoprazole  40 mg Oral BID  . pramipexole  1 mg Oral QHS  . pravastatin  40 mg Oral QHS  . saccharomyces boulardii  250 mg Oral Daily  . sodium chloride flush  3 mL Intravenous Q12H    Abtx:  Anti-infectives (From admission, onward)   Start     Dose/Rate Route Frequency Ordered Stop   09/29/18 1800  vancomycin (VANCOCIN) 1,000 mg in sodium chloride 0.9 % 250 mL IVPB     1,000 mg 250 mL/hr over 60 Minutes Intravenous Every 24 hours 09/28/18 1632     09/28/18 1730  vancomycin (VANCOCIN) 1,500 mg in sodium chloride 0.9 % 500 mL IVPB     1,500 mg 250 mL/hr over 120 Minutes Intravenous  Once 09/28/18 1632 09/28/18 1930   09/28/18 1700  cefTRIAXone (ROCEPHIN) 2 g in sodium chloride 0.9 % 100 mL IVPB     2 g 200 mL/hr over 30 Minutes Intravenous Every 24 hours 09/28/18 1619     09/28/18 1210  bacitracin 50,000 Units in sodium chloride 0.9 % 500 mL irrigation  Status:  Discontinued       As needed 09/28/18 1211 09/28/18 1354        OBJECTIVE: Blood pressure (!) 122/53, pulse 70, temperature 97.6 F (36.4 C), temperature source Oral, resp. rate 18, height '5\' 5"'  (1.651 m), weight 84.5 kg, SpO2 100 %.  Physical Exam Constitutional:      General: She is in acute distress.  HENT:     Mouth/Throat:     Mouth: Mucous membranes are moist.     Pharynx: No oropharyngeal exudate.  Eyes:     Extraocular Movements: Extraocular movements intact.     Pupils: Pupils are equal, round, and reactive to light.  Neck:     Musculoskeletal: Normal range of motion and neck supple.  Cardiovascular:     Rate and Rhythm: Normal rate and regular rhythm.  Pulmonary:     Effort: Pulmonary effort is normal.     Breath sounds: Normal breath sounds.  Abdominal:     General: Bowel sounds are normal. There is no distension.     Palpations: Abdomen is soft.     Tenderness: There is no abdominal tenderness.  Musculoskeletal:        General: No swelling.       Arms:     Right lower leg: No  edema.     Left lower leg: No edema.  Neurological:     General: No focal deficit  present.     Mental Status: She is alert.     Lab Results Results for orders placed or performed during the hospital encounter of 09/27/18 (from the past 48 hour(s))  CBC     Status: Abnormal   Collection Time: 09/29/18  2:42 AM  Result Value Ref Range   WBC 12.3 (H) 4.0 - 10.5 K/uL   RBC 4.58 3.87 - 5.11 MIL/uL   Hemoglobin 12.6 12.0 - 15.0 g/dL   HCT 39.1 36.0 - 46.0 %   MCV 85.4 80.0 - 100.0 fL   MCH 27.5 26.0 - 34.0 pg   MCHC 32.2 30.0 - 36.0 g/dL   RDW 15.2 11.5 - 15.5 %   Platelets 154 150 - 400 K/uL   nRBC 0.0 0.0 - 0.2 %    Comment: Performed at [facility], [place] [address] [place], [place] [address]  Basic metabolic panel     Status: Abnormal   Collection Time: 09/29/18  2:42 AM  Result Value Ref Range   Sodium 139 135 - 145 mmol/L   Potassium 4.2 3.5 - 5.1 mmol/L   Chloride 104 98 - 111 mmol/L   CO2 25 22 - 32 mmol/L   Glucose, Bld 114 (H) 70 - 99 mg/dL   BUN 25 (H) 8 - 23 mg/dL   Creatinine, Ser 0.89 0.44 - 1.00 mg/dL   Calcium 8.8 (L) 8.9 - 10.3 mg/dL   GFR calc non Af Amer >60 >60 mL/min   GFR calc Af Amer >60 >60 mL/min   Anion gap 10 5 - 15    Comment: Performed at [facility], [place] [address], [place], [place] [address]  Protime-INR     Status: None   Collection Time: 09/29/18  2:42 AM  Result Value Ref Range   Prothrombin Time 15.2 11.4 - 15.2 seconds   INR 1.2 0.8 - 1.2    Comment: (NOTE) INR goal varies based on device and disease states. Performed at [facility], [place] [address], [place], [place] [address]   CBC     Status: Abnormal   Collection Time: 09/30/18  3:27 AM  Result Value Ref Range   WBC 10.0 4.0 - 10.5 K/uL   RBC 4.27 3.87 - 5.11 MIL/uL   Hemoglobin 11.7 (L) 12.0 - 15.0 g/dL   HCT 37.2 36.0 - 46.0 %   MCV 87.1 80.0 - 100.0 fL   MCH 27.4 26.0 - 34.0 pg   MCHC 31.5 30.0 - 36.0 g/dL   RDW 15.4 11.5 - 15.5 %   Platelets 119 (L)  150 - 400 K/uL    Comment: REPEATED TO VERIFY PLATELET COUNT CONFIRMED BY SMEAR Immature Platelet Fraction may be clinically indicated, consider ordering this additional test [id number]    nRBC 0.0 0.0 - 0.2 %    Comment: Performed at [facility], [place] [address], [place], [place] [address]  Basic metabolic panel     Status: Abnormal   Collection Time: 09/30/18  3:27 AM  Result Value Ref Range   Sodium 137 135 - 145 mmol/L   Potassium 3.6 3.5 - 5.1 mmol/L   Chloride 105 98 - 111 mmol/L   CO2 24 22 - 32 mmol/L   Glucose, Bld 100 (H) 70 - 99 mg/dL   BUN 23 8 - 23 mg/dL   Creatinine, Ser 0.88 0.44 - 1.00 mg/dL   Calcium 8.4 (L) 8.9 - 10.3 mg/dL   GFR calc non Af Amer >60 >60  mL/min   GFR calc Af Amer >60 >60 mL/min   Anion gap 8 5 - 15    Comment: Performed at [facility] [address] [place], [place] [address]      Component Value Date/Time   SDES ABSCESS DORSAL EPIDURAL 09/28/2018 1213   SPECREQUEST NONE 09/28/2018 1213   CULT  09/28/2018 1213    NO GROWTH 2 DAYS NO ANAEROBES ISOLATED; CULTURE IN PROGRESS FOR 5 DAYS Performed at [facility], [place] [address], [place], [place] [address]    REPTSTATUS PENDING 09/28/2018 1213   Dg Thoracolumabar Spine  Result Date: 09/28/2018 CLINICAL DATA:  Localization image. Limited ectomy for epidural abscess. EXAM: THORACOLUMBAR SPINE 1V COMPARISON:  None. FINDINGS: Single lateral intraoperative fluoroscopic images provided. Based on the lumbar spine MRI of 09/27/2018, surgical instruments overlie the posterior elements of the T12 vertebral body. Fluoroscopy provided for 7 seconds. IMPRESSION: Surgical instruments overlying the posterior elements of the T12 vertebral body. Electronically Signed   By: Franki Cabot M.D.   On: 09/28/2018 14:17   Dg C-arm 1-60 Min  Result Date: 09/28/2018 CLINICAL DATA:  Localization image. Limited ectomy for epidural abscess. EXAM: THORACOLUMBAR SPINE 1V COMPARISON:  None. FINDINGS: Single  lateral intraoperative fluoroscopic images provided. Based on the lumbar spine MRI of 09/27/2018, surgical instruments overlie the posterior elements of the T12 vertebral body. Fluoroscopy provided for 7 seconds. IMPRESSION: Surgical instruments overlying the posterior elements of the T12 vertebral body. Electronically Signed   By: Franki Cabot M.D.   On: 09/28/2018 14:17   Recent Results (from the past 240 hour(s))  Blood culture (routine x 2)     Status: None (Preliminary result)   Collection Time: 09/27/18  2:15 PM  Result Value Ref Range Status   Specimen Description BLOOD LEFT ANTECUBITAL  Final   Special Requests   Final    BOTTLES DRAWN AEROBIC AND ANAEROBIC Blood Culture adequate volume   Culture   Final    NO GROWTH 3 DAYS Performed at [facility], [address], [place], [place] [address]    Report Status PENDING  Incomplete  Blood culture (routine x 2)     Status: None (Preliminary result)   Collection Time: 09/27/18  3:00 PM  Result Value Ref Range Status   Specimen Description BLOOD RIGHT ANTECUBITAL  Final   Special Requests   Final    BOTTLES DRAWN AEROBIC AND ANAEROBIC Blood Culture adequate volume   Culture   Final    NO GROWTH 3 DAYS Performed at [facility], [place] [address] [place], [place] [address]    Report Status PENDING  Incomplete  Surgical pcr screen     Status: None   Collection Time: 09/28/18  3:27 AM  Result Value Ref Range Status   MRSA, PCR NEGATIVE NEGATIVE Final   Staphylococcus aureus NEGATIVE NEGATIVE Final    Comment: (NOTE) The Xpert SA Assay (FDA approved for NASAL specimens in patients 52 years of age and older), is one component of a comprehensive surveillance program. It is not intended to diagnose infection nor to guide or monitor treatment. Performed at [facility], [place] [address], [place], [place] [address]   Aerobic/Anaerobic Culture (surgical/deep wound)     Status: None (Preliminary result)   Collection Time:  09/28/18 12:13 PM  Result Value Ref Range Status   Specimen Description ABSCESS DORSAL EPIDURAL  Final   Special Requests NONE  Final   Gram Stain  Final    RARE WBC PRESENT, PREDOMINANTLY PMN NO ORGANISMS SEEN    Culture   Final    NO GROWTH 2 DAYS NO ANAEROBES ISOLATED; CULTURE IN PROGRESS FOR 5 DAYS Performed at [facility] [address] [place], [place] [address]    Report Status PENDING  Incomplete    Microbiology: Recent Results (from the past 240 hour(s))  Blood culture (routine x 2)     Status: None (Preliminary result)   Collection Time: 09/27/18  2:15 PM  Result Value Ref Range Status   Specimen Description BLOOD LEFT ANTECUBITAL  Final   Special Requests   Final    BOTTLES DRAWN AEROBIC AND ANAEROBIC Blood Culture adequate volume   Culture   Final    NO GROWTH 3 DAYS Performed at [facility], [address] [place], [place] [address]    Report Status PENDING  Incomplete  Blood culture (routine x 2)     Status: None (Preliminary result)   Collection Time: 09/27/18  3:00 PM  Result Value Ref Range Status   Specimen Description BLOOD RIGHT ANTECUBITAL  Final   Special Requests   Final    BOTTLES DRAWN AEROBIC AND ANAEROBIC Blood Culture adequate volume   Culture   Final    NO GROWTH 3 DAYS Performed at [facility], [place] [address] [place], [place] [address]    Report Status PENDING  Incomplete  Surgical pcr screen     Status: None   Collection Time: 09/28/18  3:27 AM  Result Value Ref Range Status   MRSA, PCR NEGATIVE NEGATIVE Final   Staphylococcus aureus NEGATIVE NEGATIVE Final    Comment: (NOTE) The Xpert SA Assay (FDA approved for NASAL specimens in patients 24 years of age and older), is one component of a comprehensive surveillance program. It is not intended to diagnose infection nor to guide or monitor treatment. Performed at [facility], [place] [address] [place], [place] [address]   Aerobic/Anaerobic Culture  (surgical/deep wound)     Status: None (Preliminary result)   Collection Time: 09/28/18 12:13 PM  Result Value Ref Range Status   Specimen Description ABSCESS DORSAL EPIDURAL  Final   Special Requests NONE  Final   Gram Stain   Final    RARE WBC PRESENT, PREDOMINANTLY PMN NO ORGANISMS SEEN    Culture   Final    NO GROWTH 2 DAYS NO ANAEROBES ISOLATED; CULTURE IN PROGRESS FOR 5 DAYS Performed at [facility] [address], [place], [place] [address]    Report Status PENDING  Incomplete    Radiographs and labs were personally reviewed by me.   Bobby Rumpf, MD [facility] [phone] 09/30/2018, 12:56 PM

## 2018-09-30 NOTE — Progress Notes (Signed)
Pt noted to have >499 ml UOP on bladder scan; on call provider notified, order to I/O cath obtained. I/O cath completed, 500 ml clear yellow urine obtained. Pt screamed & called staff names, stated it was too much pain. Pt repositioned (pain medicine administered prior to I/O, pt initially spit it out stating it tastes bad) and is resting with eyes closed, RR even & unlabored.

## 2018-09-30 NOTE — Progress Notes (Signed)
Neurosurgery Service Progress Note  Subjective: No acute events overnight, I&O required for retention, still having delirium / confusion  Objective: Vitals:   09/29/18 1415 09/29/18 2121 09/30/18 0300 09/30/18 0639  BP: (!) 146/54 118/63  (!) 145/55  Pulse: 69 77  66  Resp: 20 16  18   Temp: 98 F (36.7 C) 98.4 F (36.9 C)  97.7 F (36.5 C)  TempSrc: Oral Oral  Oral  SpO2: 100% 98%  98%  Weight:   84.5 kg   Height:       Temp (24hrs), Avg:98 F (36.7 C), Min:97.7 F (36.5 C), Max:98.4 F (36.9 C)  CBC Latest Ref Rng & Units 09/30/2018 09/29/2018 09/28/2018  WBC 4.0 - 10.5 K/uL 10.0 12.3(H) 8.2  Hemoglobin 12.0 - 15.0 g/dL 11.7(L) 12.6 12.4  Hematocrit 36.0 - 46.0 % 37.2 39.1 38.9  Platelets 150 - 400 K/uL 119(L) 154 148(L)   BMP Latest Ref Rng & Units 09/30/2018 09/29/2018 09/28/2018  Glucose 70 - 99 mg/dL 100(H) 114(H) 92  BUN 8 - 23 mg/dL 23 25(H) 27(H)  Creatinine 0.44 - 1.00 mg/dL 0.88 0.89 1.07(H)  Sodium 135 - 145 mmol/L 137 139 140  Potassium 3.5 - 5.1 mmol/L 3.6 4.2 4.0  Chloride 98 - 111 mmol/L 105 104 104  CO2 22 - 32 mmol/L 24 25 27   Calcium 8.9 - 10.3 mg/dL 8.4(L) 8.8(L) 9.2    Intake/Output Summary (Last 24 hours) at 09/30/2018 0843 Last data filed at 09/30/2018 0555 Gross per 24 hour  Intake 577.5 ml  Output 755 ml  Net -177.5 ml    Current Facility-Administered Medications:  .  0.9 %  sodium chloride infusion, 250 mL, Intravenous, PRN, Judith Part, MD .  acetaminophen (TYLENOL) tablet 1,000 mg, 1,000 mg, Oral, Q8H, Danford, Suann Larry, MD, 1,000 mg at 09/30/18 0527 .  carbidopa-levodopa (SINEMET IR) 25-100 MG per tablet immediate release 2 tablet, 2 tablet, Oral, TID, Judith Part, MD, 2 tablet at 09/29/18 2141 .  cefTRIAXone (ROCEPHIN) 2 g in sodium chloride 0.9 % 100 mL IVPB, 2 g, Intravenous, Q24H, Danford, Suann Larry, MD, Stopped at 09/29/18 2209 .  docusate sodium (COLACE) capsule 100 mg, 100 mg, Oral, BID, Judith Part,  MD, 100 mg at 09/29/18 2141 .  flecainide (TAMBOCOR) tablet 50 mg, 50 mg, Oral, BID, Judith Part, MD, 50 mg at 09/29/18 2141 .  gabapentin (NEURONTIN) capsule 300 mg, 300 mg, Oral, BID, Judith Part, MD, 300 mg at 09/29/18 2140 .  hydrALAZINE (APRESOLINE) injection 10 mg, 10 mg, Intravenous, Q8H PRN, Danford, Christopher P, MD .  HYDROmorphone (DILAUDID) injection 1 mg, 1 mg, Intravenous, Q2H PRN, Danford, Suann Larry, MD, 1 mg at 09/30/18 0835 .  latanoprost (XALATAN) 0.005 % ophthalmic solution 1 drop, 1 drop, Both Eyes, QHS, Ayce Pietrzyk A, MD, 1 drop at 09/29/18 2140 .  metoprolol succinate (TOPROL-XL) 24 hr tablet 100 mg, 100 mg, Oral, Daily, Sim Choquette A, MD, 100 mg at 09/29/18 1150 .  nystatin (MYCOSTATIN/NYSTOP) topical powder, , Topical, TID, Devarious Pavek A, MD .  ondansetron (ZOFRAN) tablet 4 mg, 4 mg, Oral, Q6H PRN **OR** ondansetron (ZOFRAN) injection 4 mg, 4 mg, Intravenous, Q6H PRN, Saulo Anthis A, MD .  oxyCODONE (Oxy IR/ROXICODONE) immediate release tablet 5 mg, 5 mg, Oral, Q4H PRN, Danford, Suann Larry, MD, 5 mg at 09/30/18 0527 .  pantoprazole (PROTONIX) EC tablet 40 mg, 40 mg, Oral, BID, Judith Part, MD, 40 mg at 09/29/18 2141 .  pramipexole (MIRAPEX)  tablet 1 mg, 1 mg, Oral, QHS, Hendrix Yurkovich, Joyice Faster, MD, 1 mg at 09/29/18 2140 .  pravastatin (PRAVACHOL) tablet 40 mg, 40 mg, Oral, QHS, Mariadelcarmen Corella, Joyice Faster, MD, 40 mg at 09/29/18 2141 .  saccharomyces boulardii (FLORASTOR) capsule 250 mg, 250 mg, Oral, Daily, Malaia Buchta A, MD, 250 mg at 09/29/18 1150 .  sodium chloride flush (NS) 0.9 % injection 3 mL, 3 mL, Intravenous, Q12H, Bethanee Redondo, Joyice Faster, MD, 3 mL at 09/29/18 2143 .  sodium chloride flush (NS) 0.9 % injection 3 mL, 3 mL, Intravenous, PRN, Judith Part, MD .  vancomycin (VANCOCIN) 1,000 mg in sodium chloride 0.9 % 250 mL IVPB, 1,000 mg, Intravenous, Q24H, Kris Mouton, [facility], Stopped at 09/29/18 1941   Physical  Exam: Strength 5/5 in BUE, 4+/5 in BLE except left DF/EHL both 3/5, difficult to accurately assess sensation today due to confusion Incision c/d/i  Assessment & Plan: 78 y.o. woman with progressive severe back pain, urinary retention, and worsening ambulation 2/2 discitis / epidural abscess. 4/23 s/p T10-T12 decompressive laminectomies.   -strength improved post-op - some movement in her EHL/DF, which is encouraging. Not surprising or concerning that she is having urinary retention. I discussed with that patient and her daughter pre-op that sacral dysfunction >24h has a low likelihood of complete recovery.  -intra-op Cx NGTD -okay for DVT chemoprophylaxis on POD2 (09/30/18), can resume therapeutic anticoagulation on POD7 (10/05/18) unless she acquires a different indication for anticoagulation than PAF, at which time we can re-evaluate  Judith Part  09/30/18 8:43 AM

## 2018-10-01 ENCOUNTER — Inpatient Hospital Stay: Payer: Self-pay

## 2018-10-01 LAB — BASIC METABOLIC PANEL
Anion gap: 12 (ref 5–15)
BUN: 18 mg/dL (ref 8–23)
CO2: 22 mmol/L (ref 22–32)
Calcium: 8.5 mg/dL — ABNORMAL LOW (ref 8.9–10.3)
Chloride: 102 mmol/L (ref 98–111)
Creatinine, Ser: 0.72 mg/dL (ref 0.44–1.00)
GFR calc Af Amer: >60 mL/min (ref >60)
GFR calc non Af Amer: >60 mL/min (ref >60)
Glucose, Bld: 84 mg/dL (ref 70–99)
Potassium: 3.6 mmol/L (ref 3.5–5.1)
Sodium: 136 mmol/L (ref 135–145)

## 2018-10-01 LAB — CBC
HCT: 37 % (ref 36.0–46.0)
Hemoglobin: 11.7 g/dL — ABNORMAL LOW (ref 12.0–15.0)
MCH: 27.5 pg (ref 26.0–34.0)
MCHC: 31.6 g/dL (ref 30.0–36.0)
MCV: 86.9 fL (ref 80.0–100.0)
Platelets: 124 10*3/uL — ABNORMAL LOW (ref 150–400)
RBC: 4.26 MIL/uL (ref 3.87–5.11)
RDW: 15.3 % (ref 11.5–15.5)
WBC: 10.9 10*3/uL — ABNORMAL HIGH (ref 4.0–10.5)
nRBC: 0 % (ref 0.0–0.2)

## 2018-10-01 LAB — VANCOMYCIN, PEAK: Vancomycin Pk: 26 ug/mL — ABNORMAL LOW (ref 30–40)

## 2018-10-01 MED ORDER — VANCOMYCIN HCL IN DEXTROSE 1-5 GM/200ML-% IV SOLN
1000.0000 mg | INTRAVENOUS | Status: DC
  Administered 2018-10-01 – 2018-10-02 (×2): 1000 mg via INTRAVENOUS
  Filled 2018-10-01 (×2): qty 200

## 2018-10-01 MED ORDER — DULOXETINE HCL 20 MG PO CPEP
20.0000 mg | ORAL_CAPSULE | Freq: Every day | ORAL | Status: DC
  Administered 2018-10-01 – 2018-10-04 (×4): 20 mg via ORAL
  Filled 2018-10-01 (×4): qty 1

## 2018-10-01 MED ORDER — DEXAMETHASONE SODIUM PHOSPHATE 4 MG/ML IJ SOLN
8.0000 mg | Freq: Once | INTRAMUSCULAR | Status: AC
  Administered 2018-10-01: 8 mg via INTRAVENOUS
  Filled 2018-10-01: qty 2

## 2018-10-01 NOTE — Progress Notes (Signed)
Assisted patient with calling daughter Katharine Look at 8. Assisted patient with calling daughter Joelene Millin at 2029.  During shift assessment patient able to state year, birthday, aware in hospital in [place]. Suggested patient get OOB and in chair tomorrow 4/27 and that PT would likely assist with movement. Patient indicated she would like to get in chair 4/27.   Returned daughter Kimberly's call at 2151 while in room with patient. Informed daughter of patient's intermittent orientation and that patient was oriented to self, place and time during initial shift assessment. Indicated would assist with making her comfortable so that she could sleep. Patient spoke with daughter and became upset and seemed disoriented. (TV off, SCD's removed).  0000 completed patient care. Patient given PRN Oxycodone and scheduled Tylenol prior to patient care. Patient in pain with turning and repositioning during patient care. Patient indicated legs (upper thigh and hip area) were hurting. Also described abdominal pain. Patient repositioned and decreased stimulation in room after patient care to facilitate rest. EtCO2 in place.   Patient repeatedly removed EtCO2. During rounds on patient at 0120, EtCO2 beeping and CO2 removed by patient. Patient stated was trying to turn monitor off. Comforted patient and encouraged her to return to sleep. Patient able to read clock and asked if it was 0120; confirmed time was correct. Patient calm and did not indicate nor complain of pain. Turned off continuous pulse ox, EtCO2, lights off, tv off, SCDs off to aid rest/relaxation.   Patient slept approximately 0200-0530  Lost left upper arm IV access; IV team consult placed for possible midline. Spoke with IV team and patient to receive PICC as scheduled 4/27 AM and will attempt to place PICC before 1000 when IV ABX due. No new periph IV started.   Upon reassessment patient calm and resting, however, patient stated pain barely tolerable at the  moment.  administered pain medication at 0539.

## 2018-10-01 NOTE — Progress Notes (Signed)
Received call from patient's daughter Maudie Mercury at 2140 asking if patient was more relaxed, comfortable. Informed daughter in process of administering pain medications to ensure patient more comfortable.   During shift assessment, patient restless, irritable, disoriented (place, time and situation) and indicating pain however, difficulty describing pain and location. During neuro assessment patient stated tired of having to squeeze hands; in pain and uncomfortable therefore difficulty following commands. Patient kept repeating she was at a "ballgame" all day and to removed SCD's from legs. Removed SCDs to aid with comfort. Eventually patient identified pain in hip, lower abdomen and back. 2nd RN assisted with repositioning patient after administering pain medication.  Patient able to be calmed by providing reassurance and decreasing stimulation in room (lights dimmed/off, TV off and essential oil).   Patient restless, expressed discomfort and disoriented to situation and place when aroused from sleep for lab draw and new IV placement.   Flushed right hand IV to confirm patency. Pain with flushing and observed leaking. Stopped KVO infusion via left AC; IV leaking at 2150.  378 mL during bladder scan at 0500. Inquired if patient could try to urinate; purwick placed. Informed day RN to complete second bladder scan.

## 2018-10-01 NOTE — Progress Notes (Signed)
Patient ID: [name], female   DOB: 1940-07-19, 78 y.o.   MRN: [id number] complains of significant soreness, moves legs well, dressing dry, on IV abx per ID. No new recs

## 2018-10-01 NOTE — Progress Notes (Signed)
Pharmacy Antibiotic Note  [name] MCDUFFIE is a 78 y.o. female admitted on 09/27/2018 with spinal epidural abscess. Pharmacy has been consulted for vancomycin dosing (also noted on ceftriaxone).  Patient is s/p laminectomy on 4/23. She is afebrile and her WBC remains within normal limits. Per ID, will continue on vancomycin and ceftriaxone, with plans for 6 weeks of IV antibiotics. Will plan to get vancomycin levels today.  Plan: -Continue Vancomycin 1000mg  IV q24h -Will check vancomycin peak and trough -Monitor renal function, cultures and clinical progress   Height: 5\' 5"  (165.1 cm) Weight: 186 lb 4.6 oz (84.5 kg) IBW/kg (Calculated) : 57  Temp (24hrs), Avg:98 F (36.7 C), Min:97.9 F (36.6 C), Max:98.1 F (36.7 C)  Recent Labs  Lab 09/27/18 1425 09/27/18 1907 09/28/18 0323 09/29/18 0242 09/30/18 0327 10/01/18 0515 10/01/18 0649  WBC 12.0*  --  8.2 12.3* 10.0  --  10.9*  CREATININE 1.22* 1.03* 1.07* 0.89 0.88 0.72  --     Estimated Creatinine Clearance: 65.2 mL/min (by C-G formula based on SCr of 0.72 mg/dL).    Allergies  Allergen Reactions  . Lyrica [Pregabalin] Other (See Comments)    Felt loopy  . Zocor [Simvastatin] Other (See Comments)    Myalgias  . Relafen [Nabumetone] Rash    Antimicrobials this admission: Vanc 4/23 >> Rocephin 4/23 >>  Dose adjustments this admission: n/a  Microbiology results: 4/23 wound abscess: NGTD 4/22 blood x2- ngtd  Thank you for allowing pharmacy to be a part of this patient's care.  Jackson Latino, PharmD PGY1 Pharmacy Resident Phone [phone] 10/01/2018     1:12 PM

## 2018-10-01 NOTE — Progress Notes (Signed)
Portable was called and End-tidal CO2 was ordered.

## 2018-10-01 NOTE — Progress Notes (Signed)
INFECTIOUS DISEASE PROGRESS NOTE  ID: [name] is a 78 y.o. female with  Active Problems:   ATRIAL FIBRILLATION   Chronic anticoagulation -warfarin therapy   Parkinsonism (HCC)   Chronic kidney disease (CKD), stage III (moderate) (HCC)   Discitis of thoracolumbar region   Epidural abscess  Subjective: Resting quietly  Abtx:  Anti-infectives (From admission, onward)   Start     Dose/Rate Route Frequency Ordered Stop   10/01/18 1800  vancomycin (VANCOCIN) IVPB 1000 mg/200 mL premix     1,000 mg 200 mL/hr over 60 Minutes Intravenous Every 24 hours 10/01/18 0804     09/29/18 1800  vancomycin (VANCOCIN) 1,000 mg in sodium chloride 0.9 % 250 mL IVPB  Status:  Discontinued     1,000 mg 250 mL/hr over 60 Minutes Intravenous Every 24 hours 09/28/18 1632 10/01/18 0807   09/28/18 1730  vancomycin (VANCOCIN) 1,500 mg in sodium chloride 0.9 % 500 mL IVPB     1,500 mg 250 mL/hr over 120 Minutes Intravenous  Once 09/28/18 1632 09/28/18 1930   09/28/18 1700  cefTRIAXone (ROCEPHIN) 2 g in sodium chloride 0.9 % 100 mL IVPB     2 g 200 mL/hr over 30 Minutes Intravenous Every 24 hours 09/28/18 1619     09/28/18 1210  bacitracin 50,000 Units in sodium chloride 0.9 % 500 mL irrigation  Status:  Discontinued       As needed 09/28/18 1211 09/28/18 1354      Medications:  Scheduled: . acetaminophen  1,000 mg Oral Q8H  . carbidopa-levodopa  2 tablet Oral TID  . docusate sodium  100 mg Oral BID  . enoxaparin (LOVENOX) injection  40 mg Subcutaneous Q24H  . flecainide  50 mg Oral BID  . gabapentin  300 mg Oral BID  . latanoprost  1 drop Both Eyes QHS  . metoprolol succinate  100 mg Oral Daily  . nystatin   Topical TID  . pantoprazole  40 mg Oral BID  . pramipexole  1 mg Oral QHS  . pravastatin  40 mg Oral QHS  . saccharomyces boulardii  250 mg Oral Daily  . sodium chloride flush  3 mL Intravenous Q12H    Objective: Vital signs in last 24 hours: Temp:  [97.6 F (36.4 C)-98.1  F (36.7 C)] 98 F (36.7 C) (04/25 2142) Pulse Rate:  [70-82] 82 (04/25 2142) Resp:  [18-20] 20 (04/25 1519) BP: (122-150)/(53-74) 150/69 (04/25 2142) SpO2:  [96 %-100 %] 98 % (04/25 2142)   General appearance: no distress Resp: clear to auscultation bilaterally Cardio: regular rate and rhythm GI: normal findings: bowel sounds normal and soft, non-tender  Lab Results Recent Labs    09/30/18 0327 10/01/18 0515 10/01/18 0649  WBC 10.0  --  10.9*  HGB 11.7*  --  11.7*  HCT 37.2  --  37.0  NA 137 136  --   K 3.6 3.6  --   CL 105 102  --   CO2 24 22  --   BUN 23 18  --   CREATININE 0.88 0.72  --    Liver Panel No results for input(s): PROT, ALBUMIN, AST, ALT, ALKPHOS, BILITOT, BILIDIR, IBILI in the last 72 hours. Sedimentation Rate No results for input(s): ESRSEDRATE in the last 72 hours. C-Reactive Protein No results for input(s): CRP in the last 72 hours.  Microbiology: Recent Results (from the past 240 hour(s))  Blood culture (routine x 2)     Status: None (Preliminary result)  Collection Time: 09/27/18  2:15 PM  Result Value Ref Range Status   Specimen Description BLOOD LEFT ANTECUBITAL  Final   Special Requests   Final    BOTTLES DRAWN AEROBIC AND ANAEROBIC Blood Culture adequate volume   Culture   Final    NO GROWTH 4 DAYS Performed at [facility], [address] [place], [place] [address]    Report Status PENDING  Incomplete  Blood culture (routine x 2)     Status: None (Preliminary result)   Collection Time: 09/27/18  3:00 PM  Result Value Ref Range Status   Specimen Description BLOOD RIGHT ANTECUBITAL  Final   Special Requests   Final    BOTTLES DRAWN AEROBIC AND ANAEROBIC Blood Culture adequate volume   Culture   Final    NO GROWTH 4 DAYS Performed at [facility], [place] [address] [place], [place] [address]    Report Status PENDING  Incomplete  Surgical pcr screen     Status: None   Collection Time: 09/28/18  3:27 AM  Result Value Ref  Range Status   MRSA, PCR NEGATIVE NEGATIVE Final   Staphylococcus aureus NEGATIVE NEGATIVE Final    Comment: (NOTE) The Xpert SA Assay (FDA approved for NASAL specimens in patients 69 years of age and older), is one component of a comprehensive surveillance program. It is not intended to diagnose infection nor to guide or monitor treatment. Performed at [facility], [place] [address], [place], [place] [address]   Aerobic/Anaerobic Culture (surgical/deep wound)     Status: None (Preliminary result)   Collection Time: 09/28/18 12:13 PM  Result Value Ref Range Status   Specimen Description ABSCESS DORSAL EPIDURAL  Final   Special Requests NONE  Final   Gram Stain   Final    RARE WBC PRESENT, PREDOMINANTLY PMN NO ORGANISMS SEEN    Culture   Final    NO GROWTH 2 DAYS NO ANAEROBES ISOLATED; CULTURE IN PROGRESS FOR 5 DAYS Performed at [facility] [address], [place], [place] [address]    Report Status PENDING  Incomplete    Studies/Results: No results found.   Assessment/Plan: Diskitis, abscess  Total days of antibiotics: 2 vanco/ceftriaxone  Her BCx and op Cx are ngtd.  Will continue to watch on her dual, empiric anbx Central vs pic line given her CKD on prob list? She has nl Cr here         Bobby Rumpf MD, FACP Infectious Diseases (pager) [phone] [email or web address] 10/01/2018, 8:29 AM  LOS: 4 days

## 2018-10-01 NOTE — Progress Notes (Signed)
PROGRESS NOTE    [name]  PJA:[id number] DOB: 11-19-1940 DOA: 09/27/2018 PCP: [name], MD      Brief Narrative:  [name] is a 78 y.o. 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  Patient had developed acute on chronic back pain around beginning of January.  Was admitted 3 months ago, had MRI that showed discitis, but this was not biopsied and clinically did not appear to be discitis, and so she was discharged.  In the interim, her symptoms did not improve, and so she was referred back to Neurosurgery, who obtained an MRI that showed what appeared to be worsening discitis with epidural abscess, so she was sent to the ER.  In the ER, she was afebrile, HR and RR normal.  ANtibiotics were held in discussion with Neurosurgery, and she was admitted to the hospitalist service for laminectomy.       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 Zada Finders on 3/41, uncomplicated.  CRP and ESR are still normal and at no point has she had fever.  To the OR 4/23.  Intraop and BCx NG.     Severe pain in back.  She becomes severely altered with hydromorphone 1 mg.  She has severe pain in her back and her legs, although she is unable to say exactly where. -Continue empiric vancomycin and ceftriaxone -Daily BMP  -Continue acetaminophen, oxycodone -Stop hydromorphone as this appears to be worsening her mental status significantly -We will attempt to titrate up her oxycodone dosage.  If this is ineffective, I will start a morphine PCA tonight  -Consult infectious disease, appreciate cares -Plan for PICC in 6 weeks IV antibiotics   Acute metabolic encephaloapthy Suspect slow metabolizer of anesthesia.  Strength good in bilateral UEs, bilatearl LEs.  Reported urinary retention to surgery. -Minimize opiates Delirium  precautions:   -Lights and TV off, minimize interruptions at night  -Blinds open and lights on during day  -Glasses/hearing aid with patient  -Frequent reorientation  -PT/OT when able  -Avoid sedation medications/Beers list medications    Parkinson's disease She is somewhat bradykinetic, but on home doses of meds, suspect some of this is from pain. -Continue Sinemet, Mirapex  Atrial fibrillation, paroxysmal INR reversed for surgery.  CHA2DS2-Vasc 4 only. -Continue metoporlol, flecainide -Restart warfarin on 4/30 at 7 days from surgery, no plan for bridging  Chronic diastolic CHF Euvolemic -Continue metoprolol, statin  Other medicaitons -Continue PPI -Continue gabapentin      MDM and disposition: The below labs and imaging reports reviewed and summarized above.  Medication management as above, including every 2 hour dosing of IV Dilaudid.     The patient was admitted with MRI findings of epidural abscess.  She underwent laminectomy on 4/23 without event.   Postoperative course complicated by severe pain.  Plan for PICC line, 6 weeks IV antibiotics, transfer to SNF when pain stabilized.          DVT prophylaxis: Lovenox, patient refusing Code Status: FULL Family Communication: None    Consultants:  Neurosurgery ID   Procedures:   T9-12 laminectomy with cultures  Antimicrobials:   Vancomycin 4/23 >>  Ceftriaxone 4/23 >>    Subjective: No new fever, focal weakness, numbness.  No respiratory distress, vomiting, diarrhea, abdominal pain, headache, neck pain.  Her mentation is persistently diminished, this appears to be mostly as a side effect  of pain medications   Objective: Vitals:   09/30/18 0904 09/30/18 1519 09/30/18 2142 10/01/18 1202  BP: (!) 122/53 131/74 (!) 150/69 (!) 156/70  Pulse: 70 79 82 88  Resp: '18 20  18  ' Temp: 97.6 F (36.4 C) 98.1 F (36.7 C) 98 F (36.7 C) 97.9 F (36.6 C)  TempSrc: Oral Oral Oral Oral  SpO2: 100% 96% 98%  96%  Weight:      Height:        Intake/Output Summary (Last 24 hours) at 10/01/2018 1500 Last data filed at 10/01/2018 1429 Gross per 24 hour  Intake 180 ml  Output -  Net 180 ml   Filed Weights   09/27/18 1409 09/27/18 1852 09/30/18 0300  Weight: 83 kg 81.4 kg 84.5 kg    Examination: General appearance: Elderly adult female, lying in bed, psychomotor slowing noted HEENT: Anicteric, conjunctival pink, lids and lashes normal.  No nasal deformity, discharge, or epistaxis.  Lips dry, dentition normal, oropharynx moist, no oral lesions. Skin: Skin warm and dry, no jaundice,  Cardiac: Normal and rhythm, no murmurs, no lower extremity edema Respiratory: Normal respiratory rate and rhythm, lungs clear without rales or wheezes abdomen: Abdomen soft without tenderness to palpation or ascites. Neuro: Awake but somnolent from medication.  Psychomotor slowing noted.  She requires repeated questioning, mostly crying in pain.  Upper extremity strength equal, does not cooperate with lower extremity strength testing. Psych: Unable to assess   Data Reviewed: I have personally reviewed following labs and imaging studies:  CBC: Recent Labs  Lab 09/27/18 1425 09/28/18 0323 09/29/18 0242 09/30/18 0327 10/01/18 0649  WBC 12.0* 8.2 12.3* 10.0 10.9*  NEUTROABS 10.6*  --   --   --   --   HGB 13.0 12.4 12.6 11.7* 11.7*  HCT 41.5 38.9 39.1 37.2 37.0  MCV 86.5 85.1 85.4 87.1 86.9  PLT 171 148* 154 119* 759*   Basic Metabolic Panel: Recent Labs  Lab 09/27/18 1907 09/28/18 0323 09/29/18 0242 09/30/18 0327 10/01/18 0515  NA 139 140 139 137 136  K 4.5 4.0 4.2 3.6 3.6  CL 103 104 104 105 102  CO2 '26 27 25 24 22  ' GLUCOSE 113* 92 114* 100* 84  BUN 25* 27* 25* 23 18  CREATININE 1.03* 1.07* 0.89 0.88 0.72  CALCIUM 9.3 9.2 8.8* 8.4* 8.5*   GFR: Estimated Creatinine Clearance: 65.2 mL/min (by C-G formula based on SCr of 0.72 mg/dL). Liver Function Tests: Recent Labs  Lab 09/27/18 1425  09/28/18 0323  AST 23 20  ALT 14 7  ALKPHOS 89 88  BILITOT 1.1 1.1  PROT 5.8* 5.9*  ALBUMIN 3.5 3.5   No results for input(s): LIPASE, AMYLASE in the last 168 hours. No results for input(s): AMMONIA in the last 168 hours. Coagulation Profile: Recent Labs  Lab 09/27/18 1907 09/28/18 0704 09/29/18 0242  INR 3.4* 2.6* 1.2   Cardiac Enzymes: No results for input(s): CKTOTAL, CKMB, CKMBINDEX, TROPONINI in the last 168 hours. BNP (last 3 results) No results for input(s): PROBNP in the last 8760 hours. HbA1C: No results for input(s): HGBA1C in the last 72 hours. CBG: No results for input(s): GLUCAP in the last 168 hours. Lipid Profile: No results for input(s): CHOL, HDL, LDLCALC, TRIG, CHOLHDL, LDLDIRECT in the last 72 hours. Thyroid Function Tests: No results for input(s): TSH, T4TOTAL, FREET4, T3FREE, THYROIDAB in the last 72 hours. Anemia Panel: No results for input(s): VITAMINB12, FOLATE, FERRITIN, TIBC, IRON, RETICCTPCT in the last 72 hours. Urine  analysis:    Component Value Date/Time   COLORURINE YELLOW 07/01/2018 0628   APPEARANCEUR CLEAR 07/01/2018 0628   LABSPEC 1.023 07/01/2018 0628   PHURINE 5.0 07/01/2018 0628   GLUCOSEU NEGATIVE 07/01/2018 0628   HGBUR MODERATE (A) 07/01/2018 0628   BILIRUBINUR NEGATIVE 07/01/2018 0628   KETONESUR 5 (A) 07/01/2018 0628   PROTEINUR NEGATIVE 07/01/2018 0628   UROBILINOGEN 1.0 06/26/2010 1009   NITRITE NEGATIVE 07/01/2018 0628   LEUKOCYTESUR TRACE (A) 07/01/2018 0628   Sepsis Labs: '@LABRCNTIP' (procalcitonin:4,lacticacidven:4)  ) Recent Results (from the past 240 hour(s))  Blood culture (routine x 2)     Status: None (Preliminary result)   Collection Time: 09/27/18  2:15 PM  Result Value Ref Range Status   Specimen Description BLOOD LEFT ANTECUBITAL  Final   Special Requests   Final    BOTTLES DRAWN AEROBIC AND ANAEROBIC Blood Culture adequate volume   Culture   Final    NO GROWTH 4 DAYS Performed at [place], [place] [address], [place], [place] [address]    Report Status PENDING  Incomplete  Blood culture (routine x 2)     Status: None (Preliminary result)   Collection Time: 09/27/18  3:00 PM  Result Value Ref Range Status   Specimen Description BLOOD RIGHT ANTECUBITAL  Final   Special Requests   Final    BOTTLES DRAWN AEROBIC AND ANAEROBIC Blood Culture adequate volume   Culture   Final    NO GROWTH 4 DAYS Performed at [facility], [place] [address] [place], [place] [address]    Report Status PENDING  Incomplete  Surgical pcr screen     Status: None   Collection Time: 09/28/18  3:27 AM  Result Value Ref Range Status   MRSA, PCR NEGATIVE NEGATIVE Final   Staphylococcus aureus NEGATIVE NEGATIVE Final    Comment: (NOTE) The Xpert SA Assay (FDA approved for NASAL specimens in patients 29 years of age and older), is one component of a comprehensive surveillance program. It is not intended to diagnose infection nor to guide or monitor treatment. Performed at [facility], [place] [address] [place], [place] [address]   Aerobic/Anaerobic Culture (surgical/deep wound)     Status: None (Preliminary result)   Collection Time: 09/28/18 12:13 PM  Result Value Ref Range Status   Specimen Description ABSCESS DORSAL EPIDURAL  Final   Special Requests NONE  Final   Gram Stain   Final    RARE WBC PRESENT, PREDOMINANTLY PMN NO ORGANISMS SEEN    Culture   Final    NO GROWTH 3 DAYS NO ANAEROBES ISOLATED; CULTURE IN PROGRESS FOR 5 DAYS Performed at [facility] [address] [place],  [address]    Report Status PENDING  Incomplete         Radiology Studies: [place] Ekg Site Rite  Result Date: 10/01/2018 If Site Rite image not attached, placement could not be confirmed due to current cardiac rhythm.       Scheduled Meds: . acetaminophen  1,000 mg Oral Q8H  . carbidopa-levodopa  2 tablet Oral TID  . docusate sodium  100 mg Oral BID  . enoxaparin (LOVENOX) injection   40 mg Subcutaneous Q24H  . flecainide  50 mg Oral BID  . gabapentin  300 mg Oral BID  . latanoprost  1 drop Both Eyes QHS  . metoprolol succinate  100 mg Oral Daily  . nystatin   Topical TID  . pantoprazole  40 mg  Oral BID  . pramipexole  1 mg Oral QHS  . pravastatin  40 mg Oral QHS  . saccharomyces boulardii  250 mg Oral Daily  . sodium chloride flush  3 mL Intravenous Q12H   Continuous Infusions: . sodium chloride    . cefTRIAXone (ROCEPHIN)  IV 2 g (10/01/18 0507)  . vancomycin       LOS: 4 days    Time spent: 35 minutes    Edwin Dada, MD [facility] 10/01/2018, 3:00 PM     Please page through [place]:  [email or web address] Password [email or web address] If 7PM-7AM, please contact night-coverage

## 2018-10-02 LAB — CBC
HCT: 36.5 % (ref 36.0–46.0)
Hemoglobin: 11.9 g/dL — ABNORMAL LOW (ref 12.0–15.0)
MCH: 27.8 pg (ref 26.0–34.0)
MCHC: 32.6 g/dL (ref 30.0–36.0)
MCV: 85.3 fL (ref 80.0–100.0)
Platelets: 129 10*3/uL — ABNORMAL LOW (ref 150–400)
RBC: 4.28 MIL/uL (ref 3.87–5.11)
RDW: 14.9 % (ref 11.5–15.5)
WBC: 10.3 10*3/uL (ref 4.0–10.5)
nRBC: 0 % (ref 0.0–0.2)

## 2018-10-02 LAB — BASIC METABOLIC PANEL
Anion gap: 10 (ref 5–15)
BUN: 15 mg/dL (ref 8–23)
CO2: 24 mmol/L (ref 22–32)
Calcium: 8.7 mg/dL — ABNORMAL LOW (ref 8.9–10.3)
Chloride: 105 mmol/L (ref 98–111)
Creatinine, Ser: 0.68 mg/dL (ref 0.44–1.00)
GFR calc Af Amer: >60 mL/min (ref >60)
GFR calc non Af Amer: >60 mL/min (ref >60)
Glucose, Bld: 127 mg/dL — ABNORMAL HIGH (ref 70–99)
Potassium: 3.5 mmol/L (ref 3.5–5.1)
Sodium: 139 mmol/L (ref 135–145)

## 2018-10-02 LAB — CULTURE, BLOOD (ROUTINE X 2)
Culture: NO GROWTH
Culture: NO GROWTH
Special Requests: ADEQUATE
Special Requests: ADEQUATE

## 2018-10-02 LAB — VANCOMYCIN, TROUGH: Vancomycin Tr: 7 ug/mL — ABNORMAL LOW (ref 15–20)

## 2018-10-02 LAB — SARS CORONAVIRUS 2 BY RT PCR (HOSPITAL ORDER, PERFORMED IN ~~LOC~~ HOSPITAL LAB): SARS Coronavirus 2: NEGATIVE

## 2018-10-02 MED ORDER — SODIUM CHLORIDE 0.9% FLUSH
10.0000 mL | Freq: Two times a day (BID) | INTRAVENOUS | Status: DC
  Administered 2018-10-02 – 2018-10-03 (×2): 10 mL

## 2018-10-02 MED ORDER — VANCOMYCIN HCL 10 G IV SOLR
1250.0000 mg | INTRAVENOUS | Status: DC
  Administered 2018-10-03: 20:00:00 1250 mg via INTRAVENOUS
  Filled 2018-10-02 (×2): qty 1250

## 2018-10-02 MED ORDER — SODIUM CHLORIDE 0.9% FLUSH
10.0000 mL | INTRAVENOUS | Status: DC | PRN
  Administered 2018-10-04: 10 mL
  Filled 2018-10-02: qty 40

## 2018-10-02 MED ORDER — KETOROLAC TROMETHAMINE 15 MG/ML IJ SOLN
15.0000 mg | Freq: Once | INTRAMUSCULAR | Status: AC
  Administered 2018-10-02: 15 mg via INTRAVENOUS
  Filled 2018-10-02: qty 1

## 2018-10-02 NOTE — NC FL2 (Addendum)
[place] LEVEL OF CARE SCREENING TOOL     IDENTIFICATION  Patient Name: [name] Birthdate: Oct 07, 1940 Sex: female Admission Date (Current Location): 09/27/2018  [facility] and [place] Number:  Herbalist and Address:  [facility] [place], [place] [address] [place], [place] [address]      Provider Number: [id number]  Attending Physician Name and Address:  Edwin Dada, *  Relative Name and Phone Number:  [name] spouse [phone]/[id number], daughter    Current Level of Care: Hospital Recommended Level of Care: [place] Prior Approval Number:    Date Approved/Denied:   PASRR Number: [id number]   Discharge Plan: SNF    Current Diagnoses: Patient Active Problem List   Diagnosis Date Noted  . 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 Abnormal glucose tolerance test 07/01/2018  . Colon, diverticulosis 07/01/2018  . 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 Glaucoma 07/01/2018  . Chronic kidney disease (CKD), stage III (moderate) ([place]) 07/01/2018  . Neuropathy 07/01/2018  . Pure hypercholesterolemia 07/01/2018  . Restless leg 07/01/2018  . Discitis of thoracolumbar region 07/01/2018  . Acute bronchitis due to infection 05/04/2017  . Cough 04/21/2016  . Preoperative cardiovascular examination 04/18/2015  . Parkinsonism ([place]) 05/15/2014  . Chronic anticoagulation -warfarin therapy 02/03/2014  . Atrial fibrillation with RVR ([place]) 01/31/2014  . Gait instability 11/16/2013  . Encounter for therapeutic drug monitoring 07/20/2013  . Edema 03/03/2010  . ATRIAL FIBRILLATION 04/23/2008  . Elevated lipids 01/31/2008  . Obstructive sleep apnea 01/31/2008  . GLAUCOMA 01/30/2008  . Seasonal and perennial allergic rhinitis 01/30/2008  . Osteoarthritis 01/30/2008    Orientation RESPIRATION BLADDER Height & Weight     Self  Normal Incontinent, External catheter Weight: 186 lb 4.6 oz (84.5 kg) Height:  5\' 5"  (165.1 cm)  BEHAVIORAL  SYMPTOMS/MOOD NEUROLOGICAL BOWEL NUTRITION STATUS      Continent Diet(Please see DC Summary)  AMBULATORY STATUS COMMUNICATION OF NEEDS Skin   Extensive Assist Verbally Surgical wounds(Closed incision on back;)                       Personal Care Assistance Level of Assistance  Bathing, Feeding, Dressing Bathing Assistance: Maximum assistance Feeding assistance: Maximum assistance Dressing Assistance: Maximum assistance     Functional Limitations Info  Sight, Hearing, Speech Sight Info: Impaired Hearing Info: Adequate Speech Info: Adequate    SPECIAL CARE FACTORS FREQUENCY  PT (By licensed PT), OT (By licensed OT)     PT Frequency: 5x OT Frequency: 3x            Contractures Contractures Info: Not present    Additional Factors Info  Code Status, Allergies, Psychotropic Code Status Info: Full Allergies Info: Lyrica Pregabalin, Zocor Simvastatin, Relafen Nabumetone Psychotropic Info: Sinemet; Cymbalta         Current Medications (10/02/2018):  This is the current hospital active medication list Current Facility-Administered Medications  Medication Dose Route Frequency Provider Last Rate Last Dose  . 0.9 %  sodium chloride infusion  250 mL Intravenous PRN Judith Part, MD 10 mL/hr at 10/02/18 1000 250 mL at 10/02/18 1000  . acetaminophen (TYLENOL) tablet 1,000 mg  1,000 mg Oral Q8H Danford, Suann Larry, MD   1,000 mg at 10/02/18 0824  . carbidopa-levodopa (SINEMET IR) 25-100 MG per tablet immediate release 2 tablet  2 tablet Oral TID Judith Part, MD   2 tablet at 10/02/18 [phone]  . cefTRIAXone (ROCEPHIN) 2 g in sodium chloride 0.9 % 100  mL IVPB  2 g Intravenous Q24H Edwin Dada, MD 200 mL/hr at 10/02/18 0953 2 g at 10/02/18 0953  . docusate sodium (COLACE) capsule 100 mg  100 mg Oral BID Judith Part, MD   100 mg at 10/02/18 7793  . DULoxetine (CYMBALTA) DR capsule 20 mg  20 mg Oral Daily Edwin Dada, MD   20 mg at 10/02/18  9030  . enoxaparin (LOVENOX) injection 40 mg  40 mg Subcutaneous Q24H Edwin Dada, MD   40 mg at 10/01/18 2313  . flecainide (TAMBOCOR) tablet 50 mg  50 mg Oral BID Judith Part, MD   50 mg at 10/02/18 0923  . gabapentin (NEURONTIN) capsule 300 mg  300 mg Oral BID Judith Part, MD   300 mg at 10/02/18 3007  . hydrALAZINE (APRESOLINE) injection 10 mg  10 mg Intravenous Q8H PRN Danford, Christopher P, MD      . latanoprost (XALATAN) 0.005 % ophthalmic solution 1 drop  1 drop Both Eyes QHS Judith Part, MD   1 drop at 09/29/18 2140  . metoprolol succinate (TOPROL-XL) 24 hr tablet 100 mg  100 mg Oral Daily Judith Part, MD   100 mg at 10/02/18 0937  . nystatin (MYCOSTATIN/NYSTOP) topical powder   Topical TID Judith Part, MD      . ondansetron [facility]) tablet 4 mg  4 mg Oral Q6H PRN Judith Part, MD       Or  . ondansetron (ZOFRAN) injection 4 mg  4 mg Intravenous Q6H PRN Judith Part, MD      . oxyCODONE (Oxy IR/ROXICODONE) immediate release tablet 5 mg  5 mg Oral Q4H PRN Edwin Dada, MD   5 mg at 10/02/18 0937  . pantoprazole (PROTONIX) EC tablet 40 mg  40 mg Oral BID Judith Part, MD   40 mg at 10/02/18 6226  . pramipexole (MIRAPEX) tablet 1 mg  1 mg Oral QHS Judith Part, MD   1 mg at 10/01/18 2311  . pravastatin (PRAVACHOL) tablet 40 mg  40 mg Oral QHS Judith Part, MD   40 mg at 10/01/18 2303  . saccharomyces boulardii (FLORASTOR) capsule 250 mg  250 mg Oral Daily Judith Part, MD   250 mg at 10/02/18 3335  . sodium chloride flush (NS) 0.9 % injection 10-40 mL  10-40 mL Intracatheter PRN Danford, Suann Larry, MD      . sodium chloride flush (NS) 0.9 % injection 10-40 mL  10-40 mL Intracatheter Q12H Danford, Suann Larry, MD   10 mL at 10/02/18 0938  . sodium chloride flush (NS) 0.9 % injection 3 mL  3 mL Intravenous Q12H Judith Part, MD   3 mL at 10/01/18 0034  . sodium chloride flush  (NS) 0.9 % injection 3 mL  3 mL Intravenous PRN Judith Part, MD      . vancomycin (VANCOCIN) IVPB 1000 mg/200 mL premix  1,000 mg Intravenous Q24H Edwin Dada, MD 200 mL/hr at 10/01/18 1711 1,000 mg at 10/01/18 1711     Discharge Medications: Please see discharge summary for a list of discharge medications.  Relevant Imaging Results:  Relevant Lab Results:   Additional Information SSN: [id number]    Has a PICC for 6weeks IV Rocephin and Vancomycin  Benard Halsted, LCSW

## 2018-10-02 NOTE — Progress Notes (Signed)
Pt found with blood on sheets by NT. Pt pulled out PICC. Pressure applied to site. Minimal blood loss. Night coverage NP, Baltazar Najjar, made aware. IV team consult placed to assess pt. Gauze dressing applied to site. Will continue to monitor.

## 2018-10-02 NOTE — Progress Notes (Signed)
Pharmacy Antibiotic Note  [name] is a 78 y.o. female admitted on 09/27/2018 with spinal epidural abscess. Pharmacy has been consulted for vancomycin dosing (also noted on ceftriaxone).  Patient is s/p laminectomy on 4/23. She is afebrile and her WBC remains within normal limits. Per ID, will continue on vancomycin and ceftriaxone, with plans for 6 weeks of IV antibiotics.   Vancomycin levels subtherapeutic on 1000mg  every 24 hours per pharmacokinetic calculations using Vanc peak= 26 at 19:37 and trough=7 at 16:48 giving a subtherapeutic AUC of 366.1.   New Vancomycin dose calculated:  Vancomycin 1250 mg IV Q 24 hrs. Goal AUC 400-550. Expected AUC: 457.3 Current SCr: 0.68  Plan: -Increase Vancomycin 1250mg  IV q24h -Monitor renal function, cultures and clinical progress   Height: 5\' 5"  (165.1 cm) Weight: 186 lb 4.6 oz (84.5 kg) IBW/kg (Calculated) : 57  Temp (24hrs), Avg:98 F (36.7 C), Min:97.8 F (36.6 C), Max:98.3 F (36.8 C)  Recent Labs  Lab 09/28/18 0323 09/29/18 0242 09/30/18 0327 10/01/18 0515 10/01/18 0649 10/01/18 1937 10/02/18 0526 10/02/18 1648  WBC 8.2 12.3* 10.0  --  10.9*  --  10.3  --   CREATININE 1.07* 0.89 0.88 0.72  --   --  0.68  --   VANCOTROUGH  --   --   --   --   --   --   --  7*  VANCOPEAK  --   --   --   --   --  26*  --   --     Estimated Creatinine Clearance: 65.2 mL/min (by C-G formula based on SCr of 0.68 mg/dL).    Allergies  Allergen Reactions  . Lyrica [Pregabalin] Other (See Comments)    Felt loopy  . Zocor [Simvastatin] Other (See Comments)    Myalgias  . Relafen [Nabumetone] Rash    Antimicrobials this admission: Vanc 4/23 >> Rocephin 4/23 >>  Dose adjustments this admission: n/a  Microbiology results: 4/23 wound abscess: NGTD 4/22 blood x2- ngtd  Thank you for allowing pharmacy to be a part of this patient's care.  Jackson Latino, PharmD PGY1 Pharmacy Resident Phone [phone] 10/02/2018     6:15 PM

## 2018-10-02 NOTE — Progress Notes (Signed)
PHARMACY CONSULT NOTE FOR:  OUTPATIENT  PARENTERAL ANTIBIOTIC THERAPY (OPAT)  Indication: Epidural abscess, empiric treatment  Regimen:  Vancomycin 1250mg  every 24 hours Ceftriaxone 2g every 24 hours End date: 11/08/2018  IV antibiotic discharge orders are pended. To discharging provider:  please sign these orders via discharge navigator,  Select New Orders & click on the button choice - Manage This Unsigned Work.    Thank you for allowing pharmacy to be a part of this patient's care.  Gwenlyn Found, Sherian Rein D PGY1 Pharmacy Resident  Phone [phone] 10/02/2018   6:29 PM

## 2018-10-02 NOTE — Progress Notes (Signed)
Peripherally Inserted Central Catheter/Midline Placement  The IV Nurse has discussed with the patient and/or persons authorized to consent for the patient, the purpose of this procedure and the potential benefits and risks involved with this procedure.  The benefits include less needle sticks, lab draws from the catheter, and the patient may be discharged home with the catheter. Risks include, but not limited to, infection, bleeding, blood clot (thrombus formation), and puncture of an artery; nerve damage and irregular heartbeat and possibility to perform a PICC exchange if needed/ordered by physician.  Alternatives to this procedure were also discussed.  Bard Power PICC patient education guide, fact sheet on infection prevention and patient information card has been provided to patient /or left at bedside.    PICC/Midline Placement Documentation  PICC Single Lumen 50/03/70 PICC Right Basilic 38 cm 0 cm (Active)  Indication for Insertion or Continuance of Line Home intravenous therapies (PICC only) 10/02/2018  8:49 AM  Exposed Catheter (cm) 0 cm 10/02/2018  8:49 AM  Site Assessment Clean;Dry;Intact 10/02/2018  8:49 AM  Line Status Flushed;Blood return noted;Saline locked 10/02/2018  8:49 AM  Dressing Type Transparent;Securing device 10/02/2018  8:49 AM  Dressing Status Clean;Dry;Intact;Antimicrobial disc in place 10/02/2018  8:49 AM  Dressing Change Due 10/09/18 10/02/2018  8:49 AM       Frances Maywood 10/02/2018, 8:55 AM

## 2018-10-02 NOTE — Progress Notes (Addendum)
PROGRESS NOTE    [name]  ZOX:[id number] DOB: Dec 11, 1940 DOA: 09/27/2018 PCP: [name], MD      Brief Narrative:  [name] is a 78 y.o. 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  Patient had developed acute on chronic back pain around beginning of January.  Was admitted 3 months ago, had MRI that showed discitis, but this was not biopsied and clinically did not appear to be discitis, and so she was discharged.  In the interim, her symptoms did not improve, and so she was referred back to Neurosurgery, who obtained an MRI that showed what appeared to be worsening discitis with epidural abscess, so she was sent to the ER.  In the ER, she was afebrile, HR and RR normal.  ANtibiotics were held in discussion with Neurosurgery, and she was admitted to the hospitalist service for laminectomy.       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 Zada Finders on 8/11, uncomplicated.  CRP and ESR are still normal and at no point has she had fever.  To the OR 4/23.  Intraop and BCx NG.     Severe pain in back.  She becomes severely altered with hydromorphone 1 mg.  She has severe pain in her back and her legs, although she is unable to say exactly where. -Continue empiric vancomycin and ceftriaxone -Daily BMP  -We will need 4 week follow-up with infectious disease -We will need neurosurgery follow-up at discharge   -Continue acetaminophen, oxycodone -Continue Toradol -Continue Cymbalta, plan for 6 weeks, then taper off  -Consult infectious disease, appreciate cares -Plan for PICC in 6 weeks IV antibiotics   Acute metabolic encephaloapthy Improved significantly today after stopping IV Dilaudid.   -Minimize opiates Delirium precautions:   -Lights and TV off, minimize interruptions at night  -Blinds open and  lights on during day  -Glasses/hearing aid with patient  -Frequent reorientation  -PT/OT when able  -Avoid sedation medications/Beers list medications    Parkinson's disease She is somewhat bradykinetic, but on home doses of meds, suspect some of this is from pain. -Continue Sinemet, Mirapex  Atrial fibrillation, paroxysmal INR reversed for surgery.  CHA2DS2-Vasc 4 only. -Continue metoprolol, flecainide -Restart warfarin on 4/30 at 7 days from surgery, no plan for bridging  Chronic diastolic CHF Euvolemic -Continue metoprolol, statin  Other medicaitons -Continue PPI, gabapentin      MDM and disposition: The below labs and imaging reports reviewed and summarized above.  Medication management as above.  The patient was admitted with MRI findings of epidural abscess.  She underwent laminectomy on 4/23 without event.   Postoperative course complicated by severe pain and toxic metabolic encephalopathy, now resolving.  Plan for PICC line, 6 weeks IV antibiotics, transfer to SNF when pain stabilized, likely in 24 to 48 hours, as soon as approved by insurance.    Patient will likely require SNF level care at the time of discharge.  COVID-19 testing is ordered in order to facilitate the discharge process.  It is my clinical opinion that this patient does not have any symptoms of fever, cough, shortness of breath, diarrhea, or altered mental status related COVID-19 disease.  The patient does not require any specific isolation while COVID-19 test results pending.          DVT prophylaxis: Lovenox  Code Status: FULL Family Communication: Daughter by phone  Consultants:  Neurosurgery ID   Procedures:   T9-12 laminectomy with cultures  Antimicrobials:   Vancomycin 4/23 >>  Ceftriaxone 4/23 >>    Subjective: Mentation significantly better.  She has no focal weakness, numbness.  No new fever, respiratory distress, vomiting, diarrhea, abdominal pain, neck  pain, headache.     Objective: Vitals:   10/01/18 2252 10/02/18 0528 10/02/18 0900 10/02/18 1443  BP: (!) 159/60 (!) 168/72 135/61 125/61  Pulse: 75 66 62 66  Resp: 18 18    Temp: 98 F (36.7 C) 97.8 F (36.6 C)  98.3 F (36.8 C)  TempSrc: Oral   Oral  SpO2: 97% 96%  98%  Weight:      Height:        Intake/Output Summary (Last 24 hours) at 10/02/2018 1448 Last data filed at 10/02/2018 1447 Gross per 24 hour  Intake 1132 ml  Output -  Net 1132 ml   Filed Weights   09/27/18 1409 09/27/18 1852 09/30/18 0300  Weight: 83 kg 81.4 kg 84.5 kg    Examination: General appearance: Elderly adult female, sitting in chair, no acute distress, glasses are on down, psychomotor slowing is improved. HEENT: Anicteric, conjunctival pink, lids and lashes normal.  No nasal deformity, discharge, or epistaxis.  Lips dry, dentition normal, oropharynx moist, no oral lesions. Skin: Skin warm and dry, no jaundice  Cardiac: Normal rate and rhythm, no murmurs, no lower extremity edema.   Respiratory: Normal respiratory rate and rhythm without rales or wheezes abdomen: Abdomen soft without tenderness to palpation or as backslash 5 strength in upper and lower extremities bilaterally, no decreased sensation in the legs, speech fluent.  Cites  neuro: Awake, bradykinetic, but extraocular movements intact, . Psych: Attention normal, affect blunted by bradykinesia, oriented to person, place, and time   Data Reviewed: I have personally reviewed following labs and imaging studies:  CBC: Recent Labs  Lab 09/27/18 1425 09/28/18 0323 09/29/18 0242 09/30/18 0327 10/01/18 0649 10/02/18 0526  WBC 12.0* 8.2 12.3* 10.0 10.9* 10.3  NEUTROABS 10.6*  --   --   --   --   --   HGB 13.0 12.4 12.6 11.7* 11.7* 11.9*  HCT 41.5 38.9 39.1 37.2 37.0 36.5  MCV 86.5 85.1 85.4 87.1 86.9 85.3  PLT 171 148* 154 119* 124* 741*   Basic Metabolic Panel: Recent Labs  Lab 09/28/18 0323 09/29/18 0242 09/30/18 0327 10/01/18  0515 10/02/18 0526  NA 140 139 137 136 139  K 4.0 4.2 3.6 3.6 3.5  CL 104 104 105 102 105  CO2 _0 GLUCOSE 92 114* 100* 84 127*  BUN 27* 25* _1 CREATININE 1.07* 0.89 0.88 0.72 0.68  CALCIUM 9.2 8.8* 8.4* 8.5* 8.7*   GFR: Estimated Creatinine Clearance: 65.2 mL/min (by C-G formula based on SCr of 0.68 mg/dL). Liver Function Tests: Recent Labs  Lab 09/27/18 1425 09/28/18 0323  AST 23 20  ALT 14 7  ALKPHOS 89 88  BILITOT 1.1 1.1  PROT 5.8* 5.9*  ALBUMIN 3.5 3.5   No results for input(s): LIPASE, AMYLASE in the last 168 hours. No results for input(s): AMMONIA in the last 168 hours. Coagulation Profile: Recent Labs  Lab 09/27/18 1907 09/28/18 0704 09/29/18 0242  INR 3.4* 2.6* 1.2   Cardiac Enzymes: No results for input(s): CKTOTAL, CKMB, CKMBINDEX, TROPONINI in the last 168 hours. BNP (last 3 results) No results for input(s): PROBNP in the last 8760 hours. HbA1C: No results for  input(s): HGBA1C in the last 72 hours. CBG: No results for input(s): GLUCAP in the last 168 hours. Lipid Profile: No results for input(s): CHOL, HDL, LDLCALC, TRIG, CHOLHDL, LDLDIRECT in the last 72 hours. Thyroid Function Tests: No results for input(s): TSH, T4TOTAL, FREET4, T3FREE, THYROIDAB in the last 72 hours. Anemia Panel: No results for input(s): VITAMINB12, FOLATE, FERRITIN, TIBC, IRON, RETICCTPCT in the last 72 hours. Urine analysis:    Component Value Date/Time   COLORURINE YELLOW 07/01/2018 0628   APPEARANCEUR CLEAR 07/01/2018 0628   LABSPEC 1.023 07/01/2018 0628   PHURINE 5.0 07/01/2018 0628   GLUCOSEU NEGATIVE 07/01/2018 0628   HGBUR MODERATE (A) 07/01/2018 0628   BILIRUBINUR NEGATIVE 07/01/2018 0628   KETONESUR 5 (A) 07/01/2018 0628   PROTEINUR NEGATIVE 07/01/2018 0628   UROBILINOGEN 1.0 06/26/2010 1009   NITRITE NEGATIVE 07/01/2018 0628   LEUKOCYTESUR TRACE (A) 07/01/2018 0628   Sepsis Labs: _0 (procalcitonin:4,lacticacidven:4)  ) Recent  Results (from the past 240 hour(s))  Blood culture (routine x 2)     Status: None   Collection Time: 09/27/18  2:15 PM  Result Value Ref Range Status   Specimen Description BLOOD LEFT ANTECUBITAL  Final   Special Requests   Final    BOTTLES DRAWN AEROBIC AND ANAEROBIC Blood Culture adequate volume   Culture   Final    NO GROWTH 5 DAYS Performed at [facility], [place] [address] [place], [place] [address]    Report Status 10/02/2018 FINAL  Final  Blood culture (routine x 2)     Status: None   Collection Time: 09/27/18  3:00 PM  Result Value Ref Range Status   Specimen Description BLOOD RIGHT ANTECUBITAL  Final   Special Requests   Final    BOTTLES DRAWN AEROBIC AND ANAEROBIC Blood Culture adequate volume   Culture   Final    NO GROWTH 5 DAYS Performed at [facility], [place] [address] [place], [place] [address]    Report Status 10/02/2018 FINAL  Final  Surgical pcr screen     Status: None   Collection Time: 09/28/18  3:27 AM  Result Value Ref Range Status   MRSA, PCR NEGATIVE NEGATIVE Final   Staphylococcus aureus NEGATIVE NEGATIVE Final    Comment: (NOTE) The Xpert SA Assay (FDA approved for NASAL specimens in patients 81 years of age and older), is one component of a comprehensive surveillance program. It is not intended to diagnose infection nor to guide or monitor treatment. Performed at [facility], [place] [address], [place], [place] [address]   Aerobic/Anaerobic Culture (surgical/deep wound)     Status: None (Preliminary result)   Collection Time: 09/28/18 12:13 PM  Result Value Ref Range Status   Specimen Description ABSCESS DORSAL EPIDURAL  Final   Special Requests NONE  Final   Gram Stain   Final    RARE WBC PRESENT, PREDOMINANTLY PMN NO ORGANISMS SEEN    Culture   Final    NO GROWTH 4 DAYS NO ANAEROBES ISOLATED; CULTURE IN PROGRESS FOR 5 DAYS Performed at [place] [address], [place], [place] [address]    Report Status PENDING   Incomplete         Radiology Studies: [place] Ekg Site Rite  Result Date: 10/01/2018 If Site Rite image not attached, placement could not be confirmed due to current cardiac rhythm.       Scheduled Meds: . acetaminophen  1,000 mg Oral Q8H  . carbidopa-levodopa  2 tablet  Oral TID  . docusate sodium  100 mg Oral BID  . DULoxetine  20 mg Oral Daily  . enoxaparin (LOVENOX) injection  40 mg Subcutaneous Q24H  . flecainide  50 mg Oral BID  . gabapentin  300 mg Oral BID  . ketorolac  15 mg Intravenous Once  . latanoprost  1 drop Both Eyes QHS  . metoprolol succinate  100 mg Oral Daily  . nystatin   Topical TID  . pantoprazole  40 mg Oral BID  . pramipexole  1 mg Oral QHS  . pravastatin  40 mg Oral QHS  . saccharomyces boulardii  250 mg Oral Daily  . sodium chloride flush  10-40 mL Intracatheter Q12H  . sodium chloride flush  3 mL Intravenous Q12H   Continuous Infusions: . sodium chloride 250 mL (10/02/18 1000)  . cefTRIAXone (ROCEPHIN)  IV 2 g (10/02/18 0953)  . vancomycin 1,000 mg (10/01/18 1711)     LOS: 5 days    Time spent: 25 minutes    Edwin Dada, MD [facility] 10/02/2018, 2:48 PM     Please page through [place]:  [email or web address] Password [email or web address] If 7PM-7AM, please contact night-coverage

## 2018-10-02 NOTE — TOC Progression Note (Signed)
Transition of Care [facility]) - Progression Note    Patient Details  Name: DAYLA GASCA MRN: [id number] Date of Birth: Jan 15, 1941  Transition of [facility]) CM/SW [place], LCSW Phone Number: 10/02/2018, 10:15 AM  Clinical Narrative:    CSW spoke with patient's spouse and daughter, Joelene Millin regarding consult for SNF placement. They reported that they would prefer for patient to go to SNF. CSW discussed insurance authorization process and provided Medicare SNF ratings list. Joelene Millin reports that she has a friend who is going to help her choose a SNF. Patient's family expressed being hopeful for rehab and for patient to feel better soon. No further questions reported at this time. CSW will provide bed offers once available and then begin insurance approval process. CSW to continue to follow and assist with discharge planning needs.     Expected Discharge Plan: [place] Barriers to Discharge: Continued Medical Work up  Expected Discharge Plan and Services Expected Discharge Plan: [place] In-house Referral: Clinical Social Work Discharge Planning Services: CM Consult   Living arrangements for the past 2 months: Single Family Home                 DME Arranged: N/A DME Agency: NA                   Social Determinants of Health (SDOH) Interventions    Readmission Risk Interventions Readmission Risk Prevention Plan 10/02/2018  Transportation Screening Complete  PCP or Specialist Appt within 5-7 Days Complete  Home Care Screening Complete  Medication Review (RN CM) Complete  Some recent data might be hidden

## 2018-10-02 NOTE — Progress Notes (Signed)
Physical Therapy Treatment Patient Details Name: [name] MRN: [id number] DOB: [birth year] Today's Date: 10/02/2018    History of Present Illness Patient is a 78 y/o female who presents with back pain and confusion. Found to have discitis and epidural abscess s/p T9-T12 laminectomy for epidural abscess. PMH includes PD, CHF, chronic back pain, HTN, paroxysmal A-fib, recent cholecystectomy 07/07/18.    PT Comments    Continuing work on functional mobility and activity tolerance;  Session focused on transfers, getting to safe[facility] dn reliable ways to help Ms. Peters OOB; Excellent use of the stedy for transfers, and getting to [facility]; Overall excellent improvement -- will plan for ambulation next session;   I anticipate she will continue to recover well with post-acute rehabilitation; Will likely update PT goals next session    Follow Up Recommendations  SNF;Supervision for mobility/OOB;Supervision/Assistance - 24 hour     Equipment Recommendations  Rolling walker with 5" wheels;3in1 (PT)    Recommendations for Other Services       Precautions / Restrictions Precautions Precautions: Fall;Back Precaution Comments: Attempted to review back precautions    Mobility  Bed Mobility Overal bed mobility: Needs Assistance Bed Mobility: Rolling;Sidelying to Sit Rolling: Min assist Sidelying to sit: Mod assist       General bed mobility comments: Step by step cues for log roll technique; able to reach for rai, assist with LEs, trunk and to scoot bottom to EOB. Difficulty with initiation and sequencing.   Transfers Overall transfer level: Needs assistance   Transfers: Sit to/from Stand Sit to Stand: Min assist         General transfer comment: Cues for hand placement and safety; employed Stedy to give mroe stability with these early tries at transfers; Very nice power up; performed sit to stand x3; from bed, then from recliner, then from [facility]  Ambulation/Gait              General Gait Details: Plan for it next session   Stairs             Wheelchair Mobility    Modified Rankin (Stroke Patients Only)       Balance     Sitting balance-Leahy Scale: Fair       Standing balance-Leahy Scale: Poor                              Cognition Arousal/Alertness: Awake/alert Behavior During Therapy: WFL for tasks assessed/performed Overall Cognitive Status: Impaired/Different from baseline Area of Impairment: Orientation;Safety/judgement;Awareness                 Orientation Level: Disoriented to;Situation Current Attention Level: Sustained     Safety/Judgement: Decreased awareness of safety;Decreased awareness of deficits Awareness: Emergent          Exercises      General Comments General comments (skin integrity, edema, etc.): Large BM; RN notified      Pertinent Vitals/Pain Pain Assessment: Faces Faces Pain Scale: Hurts little more Pain Location: back Pain Descriptors / Indicators: Operative site guarding;Sore;Grimacing;Guarding Pain Intervention(s): Monitored during session;Premedicated before session;Repositioned    Home Living                      Prior Function            PT Goals (current goals can now be found in the care plan section) Acute Rehab PT Goals Patient Stated Goal: Agrees to getting OOB today; indicated  she needed to move her bowels PT Goal Formulation: With patient Time For Goal Achievement: 10/13/18 Potential to Achieve Goals: Fair Progress towards PT goals: Progressing toward goals    Frequency    Min 3X/week      PT Plan Current plan remains appropriate    Co-evaluation              AM-PAC PT "6 Clicks" Mobility   Outcome Measure  Help needed turning from your back to your side while in a flat bed without using bedrails?: A Little Help needed moving from lying on your back to sitting on the side of a flat bed without using bedrails?: A Lot Help needed  moving to and from a bed to a chair (including a wheelchair)?: A Lot Help needed standing up from a chair using your arms (e.g., wheelchair or bedside chair)?: A Little Help needed to walk in hospital room?: A Lot Help needed climbing 3-5 steps with a railing? : Total 6 Click Score: 13    End of Session Equipment Utilized During Treatment: Gait belt Activity Tolerance: Patient tolerated treatment well Patient left: in chair;with call bell/phone within reach;with chair alarm set Nurse Communication: Mobility status PT Visit Diagnosis: Pain;Muscle weakness (generalized) (M62.81);Difficulty in walking, not elsewhere classified (R26.2);Unsteadiness on feet (R26.81) Pain - part of body: (Back)     Time: 1364-3837 PT Time Calculation (min) (ACUTE ONLY): 40 min  Charges:  $Therapeutic Activity: 38-52 mins                     Roney Marion, PT  Acute Rehabilitation Services Pager [phone] Office [place] 10/02/2018, 4:30 PM

## 2018-10-02 NOTE — Progress Notes (Signed)
Neurosurgery Service Progress Note  Subjective: No acute events overnight, I&O required for retention, still having delirium / confusion  Objective: Vitals:   10/01/18 1202 10/01/18 2252 10/02/18 0528 10/02/18 0900  BP: (!) 156/70 (!) 159/60 (!) 168/72 135/61  Pulse: 88 75 66 62  Resp: 18 18 18    Temp: 97.9 F (36.6 C) 98 F (36.7 C) 97.8 F (36.6 C)   TempSrc: Oral Oral    SpO2: 96% 97% 96%   Weight:      Height:       Temp (24hrs), Avg:97.9 F (36.6 C), Min:97.8 F (36.6 C), Max:98 F (36.7 C)  CBC Latest Ref Rng & Units 10/02/2018 10/01/2018 09/30/2018  WBC 4.0 - 10.5 K/uL 10.3 10.9(H) 10.0  Hemoglobin 12.0 - 15.0 g/dL 11.9(L) 11.7(L) 11.7(L)  Hematocrit 36.0 - 46.0 % 36.5 37.0 37.2  Platelets 150 - 400 K/uL 129(L) 124(L) 119(L)   BMP Latest Ref Rng & Units 10/02/2018 10/01/2018 09/30/2018  Glucose 70 - 99 mg/dL 127(H) 84 100(H)  BUN 8 - 23 mg/dL 15 18 23   Creatinine 0.44 - 1.00 mg/dL 0.68 0.72 0.88  Sodium 135 - 145 mmol/L 139 136 137  Potassium 3.5 - 5.1 mmol/L 3.5 3.6 3.6  Chloride 98 - 111 mmol/L 105 102 105  CO2 22 - 32 mmol/L 24 22 24   Calcium 8.9 - 10.3 mg/dL 8.7(L) 8.5(L) 8.4(L)    Intake/Output Summary (Last 24 hours) at 10/02/2018 1052 Last data filed at 10/02/2018 0956 Gross per 24 hour  Intake 1092 ml  Output -  Net 1092 ml    Current Facility-Administered Medications:  .  0.9 %  sodium chloride infusion, 250 mL, Intravenous, PRN, Judith Part, MD, Last Rate: 10 mL/hr at 10/02/18 1000, 250 mL at 10/02/18 1000 .  acetaminophen (TYLENOL) tablet 1,000 mg, 1,000 mg, Oral, Q8H, Danford, Suann Larry, MD, 1,000 mg at 10/02/18 0824 .  carbidopa-levodopa (SINEMET IR) 25-100 MG per tablet immediate release 2 tablet, 2 tablet, Oral, TID, Judith Part, MD, 2 tablet at 10/02/18 [id number] .  cefTRIAXone (ROCEPHIN) 2 g in sodium chloride 0.9 % 100 mL IVPB, 2 g, Intravenous, Q24H, Danford, Suann Larry, MD, Last Rate: 200 mL/hr at 10/02/18 0953, 2 g at  10/02/18 0953 .  docusate sodium (COLACE) capsule 100 mg, 100 mg, Oral, BID, Judith Part, MD, 100 mg at 10/02/18 0937 .  DULoxetine (CYMBALTA) DR capsule 20 mg, 20 mg, Oral, Daily, Danford, Suann Larry, MD, 20 mg at 10/02/18 0938 .  enoxaparin (LOVENOX) injection 40 mg, 40 mg, Subcutaneous, Q24H, Danford, Suann Larry, MD, 40 mg at 10/01/18 2313 .  flecainide (TAMBOCOR) tablet 50 mg, 50 mg, Oral, BID, Judith Part, MD, 50 mg at 10/02/18 6384 .  gabapentin (NEURONTIN) capsule 300 mg, 300 mg, Oral, BID, Corianne Buccellato A, MD, 300 mg at 10/02/18 6659 .  hydrALAZINE (APRESOLINE) injection 10 mg, 10 mg, Intravenous, Q8H PRN, Danford, Christopher P, MD .  latanoprost (XALATAN) 0.005 % ophthalmic solution 1 drop, 1 drop, Both Eyes, QHS, Chandler Swiderski A, MD, 1 drop at 09/29/18 2140 .  metoprolol succinate (TOPROL-XL) 24 hr tablet 100 mg, 100 mg, Oral, Daily, Judith Part, MD, 100 mg at 10/02/18 0937 .  nystatin (MYCOSTATIN/NYSTOP) topical powder, , Topical, TID, Kayleena Eke A, MD .  ondansetron (ZOFRAN) tablet 4 mg, 4 mg, Oral, Q6H PRN **OR** ondansetron (ZOFRAN) injection 4 mg, 4 mg, Intravenous, Q6H PRN, Roddy Bellamy A, MD .  oxyCODONE (Oxy IR/ROXICODONE) immediate release tablet 5 mg,  5 mg, Oral, Q4H PRN, Edwin Dada, MD, 5 mg at 10/02/18 0937 .  pantoprazole (PROTONIX) EC tablet 40 mg, 40 mg, Oral, BID, Judith Part, MD, 40 mg at 10/02/18 0937 .  pramipexole (MIRAPEX) tablet 1 mg, 1 mg, Oral, QHS, Marzetta Lanza, Joyice Faster, MD, 1 mg at 10/01/18 2311 .  pravastatin (PRAVACHOL) tablet 40 mg, 40 mg, Oral, QHS, Stephaie Dardis, Joyice Faster, MD, 40 mg at 10/01/18 2303 .  saccharomyces boulardii (FLORASTOR) capsule 250 mg, 250 mg, Oral, Daily, Judith Part, MD, 250 mg at 10/02/18 0937 .  sodium chloride flush (NS) 0.9 % injection 10-40 mL, 10-40 mL, Intracatheter, PRN, Danford, Christopher P, MD .  sodium chloride flush (NS) 0.9 % injection 10-40 mL, 10-40  mL, Intracatheter, Q12H, Danford, Suann Larry, MD, 10 mL at 10/02/18 6301 .  sodium chloride flush (NS) 0.9 % injection 3 mL, 3 mL, Intravenous, Q12H, Terris Germano, Joyice Faster, MD, 3 mL at 10/01/18 0034 .  sodium chloride flush (NS) 0.9 % injection 3 mL, 3 mL, Intravenous, PRN, Judith Part, MD .  vancomycin (VANCOCIN) IVPB 1000 mg/200 mL premix, 1,000 mg, Intravenous, Q24H, Danford, Suann Larry, MD, Last Rate: 200 mL/hr at 10/01/18 1711, 1,000 mg at 10/01/18 1711   Physical Exam: Ox3 but some mild visual hallucinations during conversation regarding a pencil and pad on a dresser that doesn't exist, strength 5/5 in BUE, 4+/5 in BLE except left DF/EHL both 3/5, decreased effort diffusely Incision c/d/i  Assessment & Plan: 78 y.o. woman with progressive severe back pain, urinary retention, and worsening ambulation 2/2 discitis / epidural abscess. 4/23 s/p T10-T12 decompressive laminectomies.   -no change in neurosurgical plan of care -can resume therapeutic anticoagulation on POD7 (10/05/18)   Marcello Moores A Niklaus Mamaril  10/02/18 10:52 AM

## 2018-10-02 NOTE — Progress Notes (Signed)
Per Dr Loleta Books, " go ahead and place PICC".

## 2018-10-02 NOTE — TOC Progression Note (Signed)
Transition of Care [facility]) - Progression Note    Patient Details  Name: [name] MRN: [id number] Date of Birth: [birth year]  Transition of [facility]) CM/SW Craig, LCSW Phone Number: 10/02/2018, 1:48 PM  Clinical Narrative:    CSW provided patient's daughter Joelene Millin with SNF bed offers. She will call CSW back with a choice. Pasrr under review, requested documents submitted.    Expected Discharge Plan: College Barriers to Discharge: Continued Medical Work up  Expected Discharge Plan and Services Expected Discharge Plan: [place] In-house Referral: Clinical Social Work Discharge Planning Services: CM Consult   Living arrangements for the past 2 months: Single Family Home                 DME Arranged: N/A DME Agency: NA                   Social Determinants of Health (SDOH) Interventions    Readmission Risk Interventions Readmission Risk Prevention Plan 10/02/2018  Transportation Screening Complete  PCP or Specialist Appt within 5-7 Days Complete  Home Care Screening Complete  Medication Review (RN CM) Complete  Some recent data might be hidden

## 2018-10-03 ENCOUNTER — Inpatient Hospital Stay: Payer: Self-pay

## 2018-10-03 LAB — BASIC METABOLIC PANEL
Anion gap: 9 (ref 5–15)
BUN: 16 mg/dL (ref 8–23)
CO2: 24 mmol/L (ref 22–32)
Calcium: 8.7 mg/dL — ABNORMAL LOW (ref 8.9–10.3)
Chloride: 105 mmol/L (ref 98–111)
Creatinine, Ser: 0.81 mg/dL (ref 0.44–1.00)
GFR calc Af Amer: >60 mL/min (ref >60)
GFR calc non Af Amer: >60 mL/min (ref >60)
Glucose, Bld: 98 mg/dL (ref 70–99)
Potassium: 3.2 mmol/L — ABNORMAL LOW (ref 3.5–5.1)
Sodium: 138 mmol/L (ref 135–145)

## 2018-10-03 LAB — CBC
HCT: 35.8 % — ABNORMAL LOW (ref 36.0–46.0)
Hemoglobin: 11.6 g/dL — ABNORMAL LOW (ref 12.0–15.0)
MCH: 27.4 pg (ref 26.0–34.0)
MCHC: 32.4 g/dL (ref 30.0–36.0)
MCV: 84.6 fL (ref 80.0–100.0)
Platelets: 147 10*3/uL — ABNORMAL LOW (ref 150–400)
RBC: 4.23 MIL/uL (ref 3.87–5.11)
RDW: 14.9 % (ref 11.5–15.5)
WBC: 7.8 10*3/uL (ref 4.0–10.5)
nRBC: 0 % (ref 0.0–0.2)

## 2018-10-03 LAB — AEROBIC/ANAEROBIC CULTURE W GRAM STAIN (SURGICAL/DEEP WOUND): Culture: NO GROWTH

## 2018-10-03 MED ORDER — DEXAMETHASONE SODIUM PHOSPHATE 4 MG/ML IJ SOLN: 4.0000 mg | INTRAMUSCULAR | Status: DC

## 2018-10-03 MED ORDER — DEXAMETHASONE SODIUM PHOSPHATE 4 MG/ML IJ SOLN
4.0000 mg | Freq: Two times a day (BID) | INTRAMUSCULAR | Status: DC
  Administered 2018-10-03 – 2018-10-04 (×3): 4 mg via INTRAVENOUS
  Filled 2018-10-03 (×3): qty 1

## 2018-10-03 NOTE — Progress Notes (Signed)
Peripherally Inserted Central Catheter/Midline Placement  The IV Nurse has discussed with the patient and/or persons authorized to consent for the patient, the purpose of this procedure and the potential benefits and risks involved with this procedure.  The benefits include less needle sticks, lab draws from the catheter, and the patient may be discharged home with the catheter. Risks include, but not limited to, infection, bleeding, blood clot (thrombus formation), and puncture of an artery; nerve damage and irregular heartbeat and possibility to perform a PICC exchange if needed/ordered by physician.  Alternatives to this procedure were also discussed.  Bard Power PICC patient education guide, fact sheet on infection prevention and patient information card has been provided to patient /or left at bedside.    PICC/Midline Placement Documentation  PICC Single Lumen 67/61/95 PICC Right Basilic 38 cm 1 cm (Active)  Indication for Insertion or Continuance of Line Home intravenous therapies (PICC only) 10/03/2018  7:00 PM  Exposed Catheter (cm) 1 cm 10/03/2018  7:00 PM  Site Assessment Clean;Intact;Dry 10/03/2018  7:00 PM  Line Status Flushed;Blood return noted;Saline locked 10/03/2018  7:00 PM  Dressing Type Transparent 10/03/2018  7:00 PM  Dressing Status Dry;Clean;Antimicrobial disc in place;Intact 10/03/2018  7:00 PM  Dressing Change Due 10/10/18 10/03/2018  7:00 PM       Scotty Court 10/03/2018, 7:02 PM

## 2018-10-03 NOTE — Progress Notes (Signed)
PHARMACY CONSULT NOTE FOR:  OUTPATIENT  PARENTERAL ANTIBIOTIC THERAPY (OPAT)  Indication: Epidural abscess, empiric treatment  Regimen:  Vancomycin 1250mg  every 24 hours Ceftriaxone 2g every 24 hours End date: 11/23/2018  IV antibiotic discharge orders are pended. To discharging provider:  please sign these orders via discharge navigator,  Select New Orders & click on the button choice - Manage This Unsigned Work.    Thank you for allowing pharmacy to be a part of this patient's care.  Jimmy Footman, PharmD, BCPS, BCIDP Infectious Diseases Clinical Pharmacist Phone: [phone] 10/03/2018   2:59 PM

## 2018-10-03 NOTE — Progress Notes (Signed)
PROGRESS NOTE    [name]  IHW:[id number] DOB: [birth year] DOA: 09/27/2018 PCP: [name] Arabian, MD      Brief Narrative:  [name] is a 78 y.o. 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  Patient had developed acute on chronic back pain around beginning of January.  Was admitted 3 months ago, had MRI that showed discitis, but this was not biopsied and clinically did not appear to be discitis, and so she was discharged.  In the interim, her symptoms did not improve, and so she was referred back to Neurosurgery, who obtained an MRI that showed what appeared to be worsening discitis with epidural abscess, so she was sent to the ER.  In the ER, she was afebrile, HR and RR normal.  ANtibiotics were held in discussion with Neurosurgery, and she was admitted to the hospitalist service for laminectomy.       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  CRP and ESR are still normal and at no point has she had fever.  To the OR 4/23.  Intraop and BCx NG.     Back pain waxes and wanes.  She has "pops" in back,not associated with changes in back or leg pain, nor in numbness.    Overnight, patient pulled out PICC -Replace PICC -Continue empiric vancomycin and ceftriaxone -OPAT consult done  -Will need 4 week follow-up with infectious disease -Will need neurosurgery follow-up at discharge   -Continue acetaminophen, oxycodone -Continue dexamethasone BID for 2 days then daily for 2 days then stop -Continue Cymbalta, new start, for back pain, taper off at SNF  -Consult infectious disease, appreciate cares      Acute metabolic encephaloapthy Improved yesterday after dexamethasone, worse this morning.  Worse with opiates.  Delirious again overnight. -Minimize opiates Delirium  precautions:   -Lights and TV off, minimize interruptions at night  -Blinds open and lights on during day  -Glasses/hearing aid with patient  -Frequent reorientation  -PT/OT when able  -Avoid sedation medications/Beers list medications    Parkinson's disease She is somewhat bradykinetic, but on home doses of meds, suspect some of this is from pain. -Continue Sinemet, Mirapex  Atrial fibrillation, paroxysmal INR reversed for surgery.  CHA2DS2-Vasc 4 only. -Continue metoprolol, flecainide -Restart warfarin on 4/30 at 7 days from surgery, no plan for bridging  Chronic diastolic CHF Euvolemic -Continue metoprolol, statin  Other medicaitons -Continue PPI, gabapentin      MDM and disposition: The below labs and imaging reports were reviewed and summarized above.  Medication management as above.  The patient was admitted with MRI findings of epidural abscess.  She underwent laminectomy on 4/23 without event.   Postoperative course complicated by severe pain and toxic metabolic encephalopathy.  Plan for PICC line, 6 weeks IV antibiotics.  Stable for discharge now, would have discharged today, but patient was delirious and pulled out PICC overnight.    Replace PICC, to SNF in AM.       DVT prophylaxis: Lovenox  Code Status: FULL Family Communication: Daughter by phone    Consultants:  Neurosurgery ID   Procedures:   T9-12 laminectomy with cultures  Antimicrobials:   Vancomycin 4/23 >>  Ceftriaxone 4/23 >>    Subjective: No new fever, respiratory distress, vomiting, diarrhea, focal weakness, numbness, abdominal pain, neck pain, headache, leg pain.  Confused overnight, oriented  this morning.     Objective: Vitals:   10/03/18 0520 10/03/18 0620 10/03/18 0849 10/03/18 1330  BP: (!) 157/69  (!) 152/60 (!) 182/72  Pulse: 63  64 66  Resp: 16   18  Temp: 98.7 F (37.1 C)   97.6 F (36.4 C)  TempSrc:    Oral  SpO2: 97%   98%  Weight:  81.3 kg     Height:        Intake/Output Summary (Last 24 hours) at 10/03/2018 1607 Last data filed at 10/03/2018 0845 Gross per 24 hour  Intake 360 ml  Output -  Net 360 ml   Filed Weights   09/27/18 1852 09/30/18 0300 10/03/18 0620  Weight: 81.4 kg 84.5 kg 81.3 kg    Examination: General appearance: Elderly adult female, lying in bed, no acute distress. HEENT: Anicteric, conjunctival pink, lids and lashes normal.  Lips dry, dentition normal, oropharynx moist, no oral lesions Skin: Skin warm and dry, no jaundice  Cardiac: Normal rate and rhythm, no murmurs, no lower extremity edema Respiratory: Normal respiratory rate and rhythm, without rales or wheezes. abdomen: Abdomen soft without tenderness to palpation or rigidity.  Distention.   neuro: Awake and interactive, oriented to person, place, and time.  Extraocular movements intact, strength 5/5 in upper and lower extremities bilaterally, sensation intact bilateral lower extremities.  Speech fluent. Psych: Attention diminished, affect blunted, bradykinesia, psychomotor slowing.  Judgment and insight appear mildly impaired.   Data Reviewed: I have personally reviewed following labs and imaging studies:  CBC: Recent Labs  Lab 09/27/18 1425  09/29/18 0242 09/30/18 0327 10/01/18 0649 10/02/18 0526 10/03/18 0329  WBC 12.0*   < > 12.3* 10.0 10.9* 10.3 7.8  NEUTROABS 10.6*  --   --   --   --   --   --   HGB 13.0   < > 12.6 11.7* 11.7* 11.9* 11.6*  HCT 41.5   < > 39.1 37.2 37.0 36.5 35.8*  MCV 86.5   < > 85.4 87.1 86.9 85.3 84.6  PLT 171   < > 154 119* 124* 129* 147*   < > = values in this interval not displayed.   Basic Metabolic Panel: Recent Labs  Lab 09/29/18 0242 09/30/18 0327 10/01/18 0515 10/02/18 0526 10/03/18 0329  NA 139 137 136 139 138  K 4.2 3.6 3.6 3.5 3.2*  CL 104 105 102 105 105  CO2 _0 GLUCOSE 114* 100* 84 127* 98  BUN 25* _1 CREATININE 0.89 0.88 0.72 0.68 0.81  CALCIUM 8.8* 8.4* 8.5* 8.7*  8.7*   GFR: Estimated Creatinine Clearance: 63.2 mL/min (by C-G formula based on SCr of 0.81 mg/dL). Liver Function Tests: Recent Labs  Lab 09/27/18 1425 09/28/18 0323  AST 23 20  ALT 14 7  ALKPHOS 89 88  BILITOT 1.1 1.1  PROT 5.8* 5.9*  ALBUMIN 3.5 3.5   No results for input(s): LIPASE, AMYLASE in the last 168 hours. No results for input(s): AMMONIA in the last 168 hours. Coagulation Profile: Recent Labs  Lab 09/27/18 1907 09/28/18 0704 09/29/18 0242  INR 3.4* 2.6* 1.2   Cardiac Enzymes: No results for input(s): CKTOTAL, CKMB, CKMBINDEX, TROPONINI in the last 168 hours. BNP (last 3 results) No results for input(s): PROBNP in the last 8760 hours. HbA1C: No results for input(s): HGBA1C in the last 72 hours. CBG: No results for input(s): GLUCAP in the last 168 hours. Lipid Profile: No results for input(s):  CHOL, HDL, LDLCALC, TRIG, CHOLHDL, LDLDIRECT in the last 72 hours. Thyroid Function Tests: No results for input(s): TSH, T4TOTAL, FREET4, T3FREE, THYROIDAB in the last 72 hours. Anemia Panel: No results for input(s): VITAMINB12, FOLATE, FERRITIN, TIBC, IRON, RETICCTPCT in the last 72 hours. Urine analysis:    Component Value Date/Time   COLORURINE YELLOW 07/01/2018 0628   APPEARANCEUR CLEAR 07/01/2018 0628   LABSPEC 1.023 07/01/2018 0628   PHURINE 5.0 07/01/2018 0628   GLUCOSEU NEGATIVE 07/01/2018 0628   HGBUR MODERATE (A) 07/01/2018 0628   BILIRUBINUR NEGATIVE 07/01/2018 0628   KETONESUR 5 (A) 07/01/2018 0628   PROTEINUR NEGATIVE 07/01/2018 0628   UROBILINOGEN 1.0 06/26/2010 1009   NITRITE NEGATIVE 07/01/2018 0628   LEUKOCYTESUR TRACE (A) 07/01/2018 0628   Sepsis Labs: _0 (procalcitonin:4,lacticacidven:4)  ) Recent Results (from the past 240 hour(s))  Blood culture (routine x 2)     Status: None   Collection Time: 09/27/18  2:15 PM  Result Value Ref Range Status   Specimen Description BLOOD LEFT ANTECUBITAL  Final   Special Requests   Final     BOTTLES DRAWN AEROBIC AND ANAEROBIC Blood Culture adequate volume   Culture   Final    NO GROWTH 5 DAYS Performed at [facility], [place] [address], [place], [place] [address]    Report Status 10/02/2018 FINAL  Final  Blood culture (routine x 2)     Status: None   Collection Time: 09/27/18  3:00 PM  Result Value Ref Range Status   Specimen Description BLOOD RIGHT ANTECUBITAL  Final   Special Requests   Final    BOTTLES DRAWN AEROBIC AND ANAEROBIC Blood Culture adequate volume   Culture   Final    NO GROWTH 5 DAYS Performed at [facility], [place] [address], [place], [place] [address]    Report Status 10/02/2018 FINAL  Final  Surgical pcr screen     Status: None   Collection Time: 09/28/18  3:27 AM  Result Value Ref Range Status   MRSA, PCR NEGATIVE NEGATIVE Final   Staphylococcus aureus NEGATIVE NEGATIVE Final    Comment: (NOTE) The Xpert SA Assay (FDA approved for NASAL specimens in patients 58 years of age and older), is one component of a comprehensive surveillance program. It is not intended to diagnose infection nor to guide or monitor treatment. Performed at [facility], [place] [address], [place], [place] [address]   Aerobic/Anaerobic Culture (surgical/deep wound)     Status: None   Collection Time: 09/28/18 12:13 PM  Result Value Ref Range Status   Specimen Description ABSCESS DORSAL EPIDURAL  Final   Special Requests NONE  Final   Gram Stain   Final    RARE WBC PRESENT, PREDOMINANTLY PMN NO ORGANISMS SEEN    Culture   Final    No growth aerobically or anaerobically. Performed at [facility], [place] [address], [place], [place] [address]    Report Status 10/03/2018 FINAL  Final  SARS Coronavirus 2 [facility] order, Performed in [place] hospital lab)     Status: None   Collection Time: 10/02/18  2:32 PM  Result Value Ref Range Status   SARS Coronavirus 2 NEGATIVE NEGATIVE Final    Comment: (NOTE) If result is NEGATIVE SARS-CoV-2 target  nucleic acids are NOT DETECTED. The SARS-CoV-2 RNA is generally detectable in upper and lower  respiratory specimens during the acute phase of infection. The lowest  concentration of SARS-CoV-2 viral copies this assay can detect is  250  copies / mL. A negative result does not preclude SARS-CoV-2 infection  and should not be used as the sole basis for treatment or other  patient management decisions.  A negative result may occur with  improper specimen collection / handling, submission of specimen other  than nasopharyngeal swab, presence of viral mutation(s) within the  areas targeted by this assay, and inadequate number of viral copies  (<250 copies / mL). A negative result must be combined with clinical  observations, patient history, and epidemiological information. If result is POSITIVE SARS-CoV-2 target nucleic acids are DETECTED. The SARS-CoV-2 RNA is generally detectable in upper and lower  respiratory specimens dur ing the acute phase of infection.  Positive  results are indicative of active infection with SARS-CoV-2.  Clinical  correlation with patient history and other diagnostic information is  necessary to determine patient infection status.  Positive results do  not rule out bacterial infection or co-infection with other viruses. If result is PRESUMPTIVE POSTIVE SARS-CoV-2 nucleic acids MAY BE PRESENT.   A presumptive positive result was obtained on the submitted specimen  and confirmed on repeat testing.  While 2019 novel coronavirus  (SARS-CoV-2) nucleic acids may be present in the submitted sample  additional confirmatory testing may be necessary for epidemiological  and / or clinical management purposes  to differentiate between  SARS-CoV-2 and other Sarbecovirus currently known to infect humans.  If clinically indicated additional testing with an alternate test  methodology [phone]) is advised. The SARS-CoV-2 RNA is generally  detectable in upper and lower  respiratory sp ecimens during the acute  phase of infection. The expected result is Negative. Fact Sheet for Patients:  StrictlyIdeas.no Fact Sheet for Healthcare Providers: BankingDealers.co.za This test is not yet approved or cleared by the [place] FDA and has been authorized for detection and/or diagnosis of SARS-CoV-2 by FDA under an Emergency Use Authorization (EUA).  This EUA will remain in effect (meaning this test can be used) for the duration of the COVID-19 declaration under Section 564(b)(1) of the Act, 21 U.S.C. section 360bbb-3(b)(1), unless the authorization is terminated or revoked sooner. Performed at [facility], [place] [address] [place], [place] [address]          Radiology Studies: [place] Ekg Site Rite  Result Date: 10/03/2018 If [facility] image not attached, placement could not be confirmed due to current cardiac rhythm.       Scheduled Meds: . acetaminophen  1,000 mg Oral Q8H  . carbidopa-levodopa  2 tablet Oral TID  . dexamethasone  4 mg Intravenous Q12H   Followed by  . [START ON 10/05/2018] dexamethasone  4 mg Intravenous Q24H  . docusate sodium  100 mg Oral BID  . DULoxetine  20 mg Oral Daily  . enoxaparin (LOVENOX) injection  40 mg Subcutaneous Q24H  . flecainide  50 mg Oral BID  . gabapentin  300 mg Oral BID  . latanoprost  1 drop Both Eyes QHS  . metoprolol succinate  100 mg Oral Daily  . nystatin   Topical TID  . pantoprazole  40 mg Oral BID  . pramipexole  1 mg Oral QHS  . pravastatin  40 mg Oral QHS  . saccharomyces boulardii  250 mg Oral Daily  . sodium chloride flush  10-40 mL Intracatheter Q12H  . sodium chloride flush  3 mL Intravenous Q12H   Continuous Infusions: . sodium chloride 250 mL (10/03/18 0904)  . cefTRIAXone (ROCEPHIN)  IV 2 g (10/03/18 0906)  .  vancomycin       LOS: 6 days    Time spent: 55mnutes    CEdwin Dada MD [facility]  10/03/2018, 4:07 PM     Please page through [facility] [place]  [email or web address] Password [email or web address] If 7PM-7AM, please contact night-coverage

## 2018-10-03 NOTE — Care Management Important Message (Signed)
Important Message  Patient Details  Name: [name] MRN: [id number] Date of Birth: [birth year]   Medicare Important Message Given:  Yes    Orbie Pyo 10/03/2018, 2:41 PM

## 2018-10-04 ENCOUNTER — Other Ambulatory Visit: Payer: Self-pay | Admitting: Internal Medicine

## 2018-10-04 DIAGNOSIS — G062 Extradural and subdural abscess, unspecified: Secondary | ICD-10-CM | POA: Diagnosis not present

## 2018-10-04 DIAGNOSIS — R2681 Unsteadiness on feet: Secondary | ICD-10-CM | POA: Diagnosis not present

## 2018-10-04 DIAGNOSIS — Z4789 Encounter for other orthopedic aftercare: Secondary | ICD-10-CM | POA: Diagnosis not present

## 2018-10-04 DIAGNOSIS — M48061 Spinal stenosis, lumbar region without neurogenic claudication: Secondary | ICD-10-CM | POA: Diagnosis not present

## 2018-10-04 DIAGNOSIS — M2550 Pain in unspecified joint: Secondary | ICD-10-CM | POA: Diagnosis not present

## 2018-10-04 DIAGNOSIS — M6281 Muscle weakness (generalized): Secondary | ICD-10-CM | POA: Diagnosis not present

## 2018-10-04 DIAGNOSIS — I4891 Unspecified atrial fibrillation: Secondary | ICD-10-CM | POA: Diagnosis not present

## 2018-10-04 DIAGNOSIS — G629 Polyneuropathy, unspecified: Secondary | ICD-10-CM | POA: Diagnosis not present

## 2018-10-04 DIAGNOSIS — M5489 Other dorsalgia: Secondary | ICD-10-CM | POA: Diagnosis not present

## 2018-10-04 DIAGNOSIS — N183 Chronic kidney disease, stage 3 (moderate): Secondary | ICD-10-CM | POA: Diagnosis not present

## 2018-10-04 DIAGNOSIS — R52 Pain, unspecified: Secondary | ICD-10-CM | POA: Diagnosis not present

## 2018-10-04 DIAGNOSIS — Z7401 Bed confinement status: Secondary | ICD-10-CM | POA: Diagnosis not present

## 2018-10-04 DIAGNOSIS — I129 Hypertensive chronic kidney disease with stage 1 through stage 4 chronic kidney disease, or unspecified chronic kidney disease: Secondary | ICD-10-CM | POA: Diagnosis not present

## 2018-10-04 DIAGNOSIS — M4645 Discitis, unspecified, thoracolumbar region: Secondary | ICD-10-CM | POA: Diagnosis not present

## 2018-10-04 DIAGNOSIS — R41841 Cognitive communication deficit: Secondary | ICD-10-CM | POA: Diagnosis not present

## 2018-10-04 DIAGNOSIS — G20 Parkinson's disease: Secondary | ICD-10-CM | POA: Diagnosis not present

## 2018-10-04 DIAGNOSIS — Z7901 Long term (current) use of anticoagulants: Secondary | ICD-10-CM | POA: Diagnosis not present

## 2018-10-04 DIAGNOSIS — I480 Paroxysmal atrial fibrillation: Secondary | ICD-10-CM | POA: Diagnosis not present

## 2018-10-04 DIAGNOSIS — E785 Hyperlipidemia, unspecified: Secondary | ICD-10-CM | POA: Diagnosis not present

## 2018-10-04 DIAGNOSIS — G9341 Metabolic encephalopathy: Secondary | ICD-10-CM | POA: Diagnosis not present

## 2018-10-04 DIAGNOSIS — Z5181 Encounter for therapeutic drug level monitoring: Secondary | ICD-10-CM | POA: Diagnosis not present

## 2018-10-04 DIAGNOSIS — I509 Heart failure, unspecified: Secondary | ICD-10-CM | POA: Diagnosis not present

## 2018-10-04 LAB — BASIC METABOLIC PANEL
Anion gap: 13 (ref 5–15)
BUN: 11 mg/dL (ref 8–23)
CO2: 20 mmol/L — ABNORMAL LOW (ref 22–32)
Calcium: 8.8 mg/dL — ABNORMAL LOW (ref 8.9–10.3)
Chloride: 107 mmol/L (ref 98–111)
Creatinine, Ser: 0.71 mg/dL (ref 0.44–1.00)
GFR calc Af Amer: >60 mL/min (ref >60)
GFR calc non Af Amer: >60 mL/min (ref >60)
Glucose, Bld: 106 mg/dL — ABNORMAL HIGH (ref 70–99)
Potassium: 3.5 mmol/L (ref 3.5–5.1)
Sodium: 140 mmol/L (ref 135–145)

## 2018-10-04 LAB — CBC
HCT: 36.9 % (ref 36.0–46.0)
Hemoglobin: 12.2 g/dL (ref 12.0–15.0)
MCH: 27.8 pg (ref 26.0–34.0)
MCHC: 33.1 g/dL (ref 30.0–36.0)
MCV: 84.1 fL (ref 80.0–100.0)
Platelets: 164 10*3/uL (ref 150–400)
RBC: 4.39 MIL/uL (ref 3.87–5.11)
RDW: 14.9 % (ref 11.5–15.5)
WBC: 9 10*3/uL (ref 4.0–10.5)
nRBC: 0 % (ref 0.0–0.2)

## 2018-10-04 MED ORDER — VANCOMYCIN IV (FOR PTA / DISCHARGE USE ONLY): 1250.0000 mg | INTRAVENOUS | 0 refills | Status: DC

## 2018-10-04 MED ORDER — OXYCODONE HCL 5 MG PO TABS: 5.0000 mg | ORAL_TABLET | ORAL | 0 refills | Status: DC | PRN

## 2018-10-04 MED ORDER — OXYCODONE HCL 5 MG PO TABS: 5.0000 mg | ORAL_TABLET | Freq: Four times a day (QID) | ORAL | 0 refills | Status: DC | PRN

## 2018-10-04 MED ORDER — CEFTRIAXONE IV (FOR PTA / DISCHARGE USE ONLY): 2.0000 g | INTRAVENOUS | 0 refills | Status: AC

## 2018-10-04 MED ORDER — HEPARIN SOD (PORK) LOCK FLUSH 100 UNIT/ML IV SOLN
250.0000 [IU] | INTRAVENOUS | Status: AC | PRN
  Administered 2018-10-04: 250 [IU]

## 2018-10-04 MED ORDER — DEXAMETHASONE 4 MG PO TABS: 4.0000 mg | ORAL_TABLET | Freq: Every day | ORAL | 0 refills | Status: AC

## 2018-10-04 MED FILL — DEXAMETHASONE 4 MG TABLET: 4 | 3 days supply | Qty: 3 | Fill #0

## 2018-10-04 NOTE — Discharge Summary (Signed)
Physician Discharge Summary  [name]:[id number] DOB: Dec 23, 1940 DOA: 09/27/2018  PCP: [name], MD  Admit date: 09/27/2018 Discharge date: 10/04/2018  Admitted From: home Disposition:  SNF, [facility]  Recommendations for Outpatient Follow-up:  1. Follow up with PCP in 1-2 weeks 2. Continue Decadron for back pain for 3 more days 3. Resume Coumadin on 10/05/2018 at 7 days after her surgery without bridging   Discharge Condition: Stable CODE STATUS: Full code Diet recommendation: Heart healthy diet  HPI: Per admitting MD, [name] is a 78 y.o. female CHF, DJD, lumbar stenosis, paroxysmal A. Fib, Parkinson disease, who was recently discharged from the hospital on February and treated for cholecystitis, underwent laparoscopic cholecystectomy.  During that admission she had a thoracolumbar MRI with several indicators of discitis osteomyelitis at the level T8 12 L1 level. It  was determine that she  did not have discitis and she was not discharged on antibiotics for the same.  During that admission patient was complaining of abdominal pain HIDA scan showed low EF.  She underwent cholecystectomy on 1/31/ 2020. Patient continued to have back pain, she follow-up with her  neurosurgeon and they order an MRI of the lumbar spine, which showed epidural abscess, worsening diskitis T 12--L 1,  per Dr Venora Maples report.  MRI of the lumbar spine was done outpatient cenetr.  Patient also presented to the ED with worsening confusion. MRI thoracic spine ordered. Neurosurgery Dr Zada Finders has been consulted.  Patient report back pain, radiates to her leg. She report pain 9/10. Constant, worse since 1 week ago. Per daughter patient has been more confused.  Was recently started on prednisone for back pain.   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 Zada Finders on 6/72, uncomplicated.  CRP and ESR are still normal and  at no point has she had fever.  Intraoperative and all other cultures have remained negative.  Infectious disease was consulted, she was placed on vancomycin and ceftriaxone which are to be continued on 11/23/2018 (for full instructions see below on medication list).  PICC line was placed, she will need follow-up with neurosurgery and ID as an outpatient prior to antibiotics being discontinued.  She was placed on oxycodone as well as Decadron for back pain.  Acute metabolic encephaloapthy -intermittent, likely in the setting of opiates, minimize use.  Currently alert and oriented x4.  Parkinson's disease -Continue home medications   Atrial fibrillation, paroxysmal -Continue metoprolol, flecainide, restart warfarin on 4/30 at 7 days from surgery, no plan for bridging  Chronic diastolic CHF- euvolemic  Discharge Diagnoses:  Active Problems:   ATRIAL FIBRILLATION   Chronic anticoagulation -warfarin therapy   Parkinsonism (HCC)   Chronic kidney disease (CKD), stage III (moderate) (HCC)   Discitis of thoracolumbar region   Epidural abscess     Discharge Instructions  Discharge Instructions    Home infusion instructions [facility] May follow [place] Dosing Protocol; May administer Cathflo as needed to maintain patency of vascular access device.; Flushing of vascular access device: per [facility] Protocol: 0.9% NaCl pre/post medica...   Complete by:  As directed    Instructions:  May follow [place] Dosing Protocol   Instructions:  May administer Cathflo as needed to maintain patency of vascular access device.   Instructions:  Flushing of vascular access device: per [facility] Protocol: 0.9% NaCl pre/post medication administration and prn patency; Heparin 100 u/ml, 42m for implanted ports and Heparin 10u/ml,  46m for all other central venous catheters.   Instructions:  May follow [facility] Anaphylaxis Protocol for First Dose Administration in the home: 0.9% NaCl at 25-50 ml/hr to maintain IV  access for protocol meds. Epinephrine 0.3 ml IV/IM PRN and Benadryl 25-50 IV/IM PRN s/s of anaphylaxis.   Instructions:  [place] Coord[place] (RN) to assist per patient IV care needs in the home PRN.     Allergies as of 10/04/2018      Reactions   Lyrica [pregabalin] Other (See Comments)   Felt loopy   Zocor [simvastatin] Other (See Comments)   Myalgias   Relafen [nabumetone] Rash      Medication List    STOP taking these medications   oxyCODONE-acetaminophen 5-325 MG tablet Commonly known as:  PERCOCET/ROXICET   predniSONE 10 MG tablet Commonly known as:  DELTASONE     TAKE these medications   acetaminophen 325 MG tablet Commonly known as:  TYLENOL Take 325 mg by mouth daily as needed for mild pain.   calcium carbonate 500 MG chewable tablet Commonly known as:  TUMS - dosed in mg elemental calcium Chew 1 tablet by mouth as needed for indigestion or heartburn.   carbidopa-levodopa 25-100 MG tablet Commonly known as:  SINEMET IR Take 2 tablets by mouth 3 (three) times daily.   cefTRIAXone  IVPB Commonly known as:  ROCEPHIN Inject 2 g into the vein daily. Indication: Subdural abscess, empiric treatment  Last Day of Therapy: 11/23/2018 Labs - Once weekly:  CBC/D and BMP, Labs - Every other week:  ESR and CRP   dexamethasone 4 MG tablet Commonly known as:  DECADRON Take 1 tablet (4 mg total) by mouth daily for 3 doses.   flecainide 50 MG tablet Commonly known as:  TAMBOCOR TAKE 1 TABLET BY MOUTH 2 TIMES DAILY   furosemide 20 MG tablet Commonly known as:  LASIX Take 40 mg by mouth as needed for fluid.   gabapentin 300 MG capsule Commonly known as:  NEURONTIN Take 300 mg by mouth 2 (two) times daily.   metoprolol succinate 100 MG 24 hr tablet Commonly known as:  TOPROL-XL TAKE 1 TABLET BY MOUTH DAILY   oxyCODONE 5 MG immediate release tablet Commonly known as:  Oxy IR/ROXICODONE Take 1 tablet (5 mg total) by mouth every 4 (four) hours as  needed for severe pain.   pantoprazole 40 MG tablet Commonly known as:  PROTONIX Take 40 mg by mouth 2 (two) times daily.   polyethylene glycol 17 g packet Commonly known as:  MIRALAX / GLYCOLAX Take 17 g by mouth daily.   potassium chloride SA 20 MEQ tablet Commonly known as:  K-DUR Take 40 mEq by mouth daily.   pramipexole 0.5 MG tablet Commonly known as:  MIRAPEX Take 1 mg by mouth at bedtime. For restless legs   pravastatin 40 MG tablet Commonly known as:  PRAVACHOL Take 40 mg by mouth at bedtime.   PROBIOTIC-10 PO Take 1 capsule by mouth daily.   Travatan Z 0.004 % Soln ophthalmic solution Generic drug:  Travoprost (BAK Free) Place 1 drop into both eyes at bedtime.   vancomycin  IVPB Inject 1,250 mg into the vein daily. Indication: subdural abscess  Last Day of Therapy: 11/23/2018 Labs - Sunday/Monday:  CBC/D, BMP, and vancomycin trough. Labs - Thursday:  BMP and vancomycin trough Labs - Every other week:  ESR and CRP   warfarin 2.5 MG tablet Commonly known as:  Coumadin Take as directed. If you are  unsure how to take this medication, talk to your nurse or doctor. Original instructions:  Take 1 tablet (2.5 mg total) by mouth as directed. What changed:    how much to take  when to take this  additional instructions            Home Infusion Instuctions  (From admission, onward)         Start     Ordered   10/04/18 0000  Home infusion instructions [facility] May follow [place] Dosing Protocol; May administer Cathflo as needed to maintain patency of vascular access device.; Flushing of vascular access device: per [facility] Protocol: 0.9% NaCl pre/post medica...    Question Answer Comment  Instructions May follow [place] Dosing Protocol   Instructions May administer Cathflo as needed to maintain patency of vascular access device.   Instructions Flushing of vascular access device: per [facility] Protocol: 0.9% NaCl pre/post medication administration  and prn patency; Heparin 100 u/ml, 31m for implanted ports and Heparin 10u/ml, 548mfor all other central venous catheters.   Instructions May follow [facility] Anaphylaxis Protocol for First Dose Administration in the home: 0.9% NaCl at 25-50 ml/hr to maintain IV access for protocol meds. Epinephrine 0.3 ml IV/IM PRN and Benadryl 25-50 IV/IM PRN s/s of anaphylaxis.   Instructions [facility] Infusion Coordinator (RN) to assist per patient IV care needs in the home PRN.      10/04/18 1136         Contact information for after-discharge care    Destination    [facility] Preferred SNF .   Service:  Skilled Nursing Contact information: [address] [phone]            Consultations:  Neurosurgery   Infectious disease  Procedures/Studies:  T9-12 laminectomy  Dg Thoracolumabar Spine  Result Date: 09/28/2018 CLINICAL DATA:  Localization image. Limited ectomy for epidural abscess. EXAM: THORACOLUMBAR SPINE 1V COMPARISON:  None. FINDINGS: Single lateral intraoperative fluoroscopic images provided. Based on the lumbar spine MRI of 09/27/2018, surgical instruments overlie the posterior elements of the T12 vertebral body. Fluoroscopy provided for 7 seconds. IMPRESSION: Surgical instruments overlying the posterior elements of the T12 vertebral body. Electronically Signed   By: StFranki Cabot.D.   On: 09/28/2018 14:17   Mr Thoracic Spine W Wo Contrast  Result Date: 09/27/2018 CLINICAL DATA:  Low back pain. Lumbar MRI demonstrated epidural abscess extending into the thoracic spine. EXAM: MRI THORACIC WITHOUT AND WITH CONTRAST TECHNIQUE: Multiplanar and multiecho pulse sequences of the thoracic spine were obtained without and with intravenous contrast. CONTRAST:  8 mL Gadovist IV COMPARISON:  Lumbar MRI 09/27/2018. Thoracic and lumbar MRI 07/01/2018 FINDINGS: MRI THORACIC SPINE FINDINGS Alignment:  Normal Vertebrae: Changes of  discitis and osteomyelitis at T12-L1. There is increased fluid within the disc space with considerable endplate destruction which has progressed since 07/01/2018. Irregular enhancement of the endplates postcontrast administration. Pedicle screw fusion bilaterally extending from L1 through S1. No other areas of infection or fracture in the thoracic spine Cord: Large epidural abscess in the spinal canal arising from T12-L1 and extending superiorly. Multilocular fluid collection is present in the ventral epidural space extending cranially to the T9-10 disc space. There is cord compression at T11-12 and T12-L1. Possible gas within the epidural abscess at T10. Paraspinal and other soft tissues: Paraspinous soft tissue thickening at T12-L1 extending into the psoas muscle bilaterally compatible with developing small abscess. Disc levels: Thoracic  disc degeneration as below. T7-8: Small right-sided spurring with slight cord flattening. No significant stenosis T8-9: Small central disc protrusion T9-10: Small left-sided disc protrusion T10-11: Large ventral epidural abscess with cord compression T11-12: Large ventral epidural abscess extending to the left of midline with cord compression T12-L1: Discitis and osteomyelitis with endplate destruction and bilateral facet degeneration. Moderate spinal stenosis. IMPRESSION: Findings consistent with discitis and osteomyelitis at T12-L1. Large epidural abscess extends from the T12-L1 disc space to the T9-10 disc space. There is cord compression at T11 and T12 due to the large abscess. Paraspinous soft tissue infection and early psoas abscess bilaterally at T12-L1. These results were called by telephone at the time of interpretation on 09/27/2018 at 6:09 pm to Dr. Vanita Panda , who verbally acknowledged these results. Electronically Signed   By: Franchot Gallo M.D.   On: 09/27/2018 18:10   Dg C-arm 1-60 Min  Result Date: 09/28/2018 CLINICAL DATA:  Localization image. Limited ectomy  for epidural abscess. EXAM: THORACOLUMBAR SPINE 1V COMPARISON:  None. FINDINGS: Single lateral intraoperative fluoroscopic images provided. Based on the lumbar spine MRI of 09/27/2018, surgical instruments overlie the posterior elements of the T12 vertebral body. Fluoroscopy provided for 7 seconds. IMPRESSION: Surgical instruments overlying the posterior elements of the T12 vertebral body. Electronically Signed   By: Franki Cabot M.D.   On: 09/28/2018 14:17   [place] Ekg Site Rite  Result Date: 10/03/2018 If Site Rite image not attached, placement could not be confirmed due to current cardiac rhythm.  [place] Ekg Site Rite  Result Date: 10/01/2018 If Site Rite image not attached, placement could not be confirmed due to current cardiac rhythm.     Subjective: - no chest pain, shortness of breath, no abdominal pain, nausea or vomiting.  Has back pain, controlled  Discharge Exam: BP (!) 142/52    Pulse 63    Temp (!) 97.5 F (36.4 C) (Oral)    Resp 16    Ht '5\' 5"'  (1.651 m)    Wt 79.7 kg    SpO2 100%    BMI 29.24 kg/m   General: Pt is alert, awake, not in acute distress Cardiovascular: RRR, S1/S2 +, no rubs, no gallops Respiratory: CTA bilaterally, no wheezing, no rhonchi Abdominal: Soft, NT, ND, bowel sounds + Extremities: no edema, no cyanosis    The results of significant diagnostics from this hospitalization (including imaging, microbiology, ancillary and laboratory) are listed below for reference.     Microbiology: Recent Results (from the past 240 hour(s))  Blood culture (routine x 2)     Status: None   Collection Time: 09/27/18  2:15 PM  Result Value Ref Range Status   Specimen Description BLOOD LEFT ANTECUBITAL  Final   Special Requests   Final    BOTTLES DRAWN AEROBIC AND ANAEROBIC Blood Culture adequate volume   Culture   Final    NO GROWTH 5 DAYS Performed at [facility], [address] [place], [place] [address]    Report Status 10/02/2018 FINAL  Final  Blood  culture (routine x 2)     Status: None   Collection Time: 09/27/18  3:00 PM  Result Value Ref Range Status   Specimen Description BLOOD RIGHT ANTECUBITAL  Final   Special Requests   Final    BOTTLES DRAWN AEROBIC AND ANAEROBIC Blood Culture adequate volume   Culture   Final    NO GROWTH 5 DAYS Performed at [facility], [place] [address], [place], [place] [address]  Report Status 10/02/2018 FINAL  Final  Surgical pcr screen     Status: None   Collection Time: 09/28/18  3:27 AM  Result Value Ref Range Status   MRSA, PCR NEGATIVE NEGATIVE Final   Staphylococcus aureus NEGATIVE NEGATIVE Final    Comment: (NOTE) The Xpert SA Assay (FDA approved for NASAL specimens in patients 35 years of age and older), is one component of a comprehensive surveillance program. It is not intended to diagnose infection nor to guide or monitor treatment. Performed at [facility], [place] [address] [place], [place] [address]   Aerobic/Anaerobic Culture (surgical/deep wound)     Status: None   Collection Time: 09/28/18 12:13 PM  Result Value Ref Range Status   Specimen Description ABSCESS DORSAL EPIDURAL  Final   Special Requests NONE  Final   Gram Stain   Final    RARE WBC PRESENT, PREDOMINANTLY PMN NO ORGANISMS SEEN    Culture   Final    No growth aerobically or anaerobically. Performed at [facility], [place] [address] [place], [place] [address]    Report Status 10/03/2018 FINAL  Final  SARS Coronavirus 2 [facility] order, Performed in [facility] hospital lab)     Status: None   Collection Time: 10/02/18  2:32 PM  Result Value Ref Range Status   SARS Coronavirus 2 NEGATIVE NEGATIVE Final    Comment: (NOTE) If result is NEGATIVE SARS-CoV-2 target nucleic acids are NOT DETECTED. The SARS-CoV-2 RNA is generally detectable in upper and lower  respiratory specimens during the acute phase of infection. The lowest  concentration of SARS-CoV-2 viral copies this assay can detect is 250    copies / mL. A negative result does not preclude SARS-CoV-2 infection  and should not be used as the sole basis for treatment or other  patient management decisions.  A negative result may occur with  improper specimen collection / handling, submission of specimen other  than nasopharyngeal swab, presence of viral mutation(s) within the  areas targeted by this assay, and inadequate number of viral copies  (<250 copies / mL). A negative result must be combined with clinical  observations, patient history, and epidemiological information. If result is POSITIVE SARS-CoV-2 target nucleic acids are DETECTED. The SARS-CoV-2 RNA is generally detectable in upper and lower  respiratory specimens dur ing the acute phase of infection.  Positive  results are indicative of active infection with SARS-CoV-2.  Clinical  correlation with patient history and other diagnostic information is  necessary to determine patient infection status.  Positive results do  not rule out bacterial infection or co-infection with other viruses. If result is PRESUMPTIVE POSTIVE SARS-CoV-2 nucleic acids MAY BE PRESENT.   A presumptive positive result was obtained on the submitted specimen  and confirmed on repeat testing.  While 2019 novel coronavirus  (SARS-CoV-2) nucleic acids may be present in the submitted sample  additional confirmatory testing may be necessary for epidemiological  and / or clinical management purposes  to differentiate between  SARS-CoV-2 and other Sarbecovirus currently known to infect humans.  If clinically indicated additional testing with an alternate test  methodology [phone]) is advised. The SARS-CoV-2 RNA is generally  detectable in upper and lower respiratory sp ecimens during the acute  phase of infection. The expected result is Negative. Fact Sheet for Patients:  StrictlyIdeas.no Fact Sheet for Healthcare  Providers: BankingDealers.co.za This test is not yet approved or cleared by the [place] FDA and has been authorized for detection and/or  diagnosis of SARS-CoV-2 by FDA under an Emergency Use Authorization (EUA).  This EUA will remain in effect (meaning this test can be used) for the duration of the COVID-19 declaration under Section 564(b)(1) of the Act, 21 U.S.C. section 360bbb-3(b)(1), unless the authorization is terminated or revoked sooner. Performed at [facility], [place] [address], [place], [place] [address]      Labs: BNP (last 3 results) No results for input(s): BNP in the last 8760 hours. Basic Metabolic Panel: Recent Labs  Lab 09/30/18 0327 10/01/18 0515 10/02/18 0526 10/03/18 0329 10/04/18 0317  NA 137 136 139 138 140  K 3.6 3.6 3.5 3.2* 3.5  CL 105 102 105 105 107  CO2 '24 22 24 24 ' 20*  GLUCOSE 100* 84 127* 98 106*  BUN '23 18 15 16 11  ' CREATININE 0.88 0.72 0.68 0.81 0.71  CALCIUM 8.4* 8.5* 8.7* 8.7* 8.8*   Liver Function Tests: Recent Labs  Lab 09/27/18 1425 09/28/18 0323  AST 23 20  ALT 14 7  ALKPHOS 89 88  BILITOT 1.1 1.1  PROT 5.8* 5.9*  ALBUMIN 3.5 3.5   No results for input(s): LIPASE, AMYLASE in the last 168 hours. No results for input(s): AMMONIA in the last 168 hours. CBC: Recent Labs  Lab 09/27/18 1425  09/30/18 0327 10/01/18 0649 10/02/18 0526 10/03/18 0329 10/04/18 0317  WBC 12.0*   < > 10.0 10.9* 10.3 7.8 9.0  NEUTROABS 10.6*  --   --   --   --   --   --   HGB 13.0   < > 11.7* 11.7* 11.9* 11.6* 12.2  HCT 41.5   < > 37.2 37.0 36.5 35.8* 36.9  MCV 86.5   < > 87.1 86.9 85.3 84.6 84.1  PLT 171   < > 119* 124* 129* 147* 164   < > = values in this interval not displayed.   Cardiac Enzymes: No results for input(s): CKTOTAL, CKMB, CKMBINDEX, TROPONINI in the last 168 hours. BNP: Invalid input(s): POCBNP CBG: No results for input(s): GLUCAP in the last 168 hours. D-Dimer No results for input(s):  DDIMER in the last 72 hours. Hgb A1c No results for input(s): HGBA1C in the last 72 hours. Lipid Profile No results for input(s): CHOL, HDL, LDLCALC, TRIG, CHOLHDL, LDLDIRECT in the last 72 hours. Thyroid function studies No results for input(s): TSH, T4TOTAL, T3FREE, THYROIDAB in the last 72 hours.  Invalid input(s): FREET3 Anemia work up No results for input(s): VITAMINB12, FOLATE, FERRITIN, TIBC, IRON, RETICCTPCT in the last 72 hours. Urinalysis    Component Value Date/Time   COLORURINE YELLOW 07/01/2018 0628   APPEARANCEUR CLEAR 07/01/2018 0628   LABSPEC 1.023 07/01/2018 0628   PHURINE 5.0 07/01/2018 0628   GLUCOSEU NEGATIVE 07/01/2018 0628   HGBUR MODERATE (A) 07/01/2018 0628   BILIRUBINUR NEGATIVE 07/01/2018 0628   KETONESUR 5 (A) 07/01/2018 0628   PROTEINUR NEGATIVE 07/01/2018 0628   UROBILINOGEN 1.0 06/26/2010 1009   NITRITE NEGATIVE 07/01/2018 0628   LEUKOCYTESUR TRACE (A) 07/01/2018 0628   Sepsis Labs Invalid input(s): PROCALCITONIN,  WBC,  LACTICIDVEN  FURTHER DISCHARGE INSTRUCTIONS:   Get Medicines reviewed and adjusted: Please take all your medications with you for your next visit with your Primary MD   Laboratory/radiological data: Please request your Primary MD to go over all hospital tests and procedure/radiological results at the follow up, please ask your Primary MD to get all Hospital records sent to his/her office.   In some cases, they will be blood  work, cultures and biopsy results pending at the time of your discharge. Please request that your primary care M.D. goes through all the records of your hospital data and follows up on these results.   Also Note the following: If you experience worsening of your admission symptoms, develop shortness of breath, life threatening emergency, suicidal or homicidal thoughts you must seek medical attention immediately by calling 911 or calling your MD immediately  if symptoms less severe.   You must read complete  instructions/literature along with all the possible adverse reactions/side effects for all the Medicines you take and that have been prescribed to you. Take any new Medicines after you have completely understood and accpet all the possible adverse reactions/side effects.    Do not drive when taking Pain medications or sleeping medications (Benzodaizepines)   Do not take more than prescribed Pain, Sleep and Anxiety Medications. It is not advisable to combine anxiety,sleep and pain medications without talking with your primary care practitioner   Special Instructions: If you have smoked or chewed Tobacco  in the last 2 yrs please stop smoking, stop any regular Alcohol  and or any Recreational drug use.   Wear Seat belts while driving.   Please note: You were cared for by a hospitalist during your hospital stay. Once you are discharged, your primary care physician will handle any further medical issues. Please note that NO REFILLS for any discharge medications will be authorized once you are discharged, as it is imperative that you return to your primary care physician (or establish a relationship with a primary care physician if you do not have one) for your post hospital discharge needs so that they can reassess your need for medications and monitor your lab values.  Time coordinating discharge: 35 minutes  SIGNED:  Marzetta Board, MD, PhD 10/04/2018, 11:37 AM

## 2018-10-04 NOTE — Progress Notes (Addendum)
[name] to be D/C'd Skilled nursing facility per MD order.  Discussed with the patient and all questions fully answered.  VSS, Skin clean, dry and intact without evidence of skin break down, no evidence of skin tears noted. IV catheter discontinued intact. Site without signs and symptoms of complications. Dressing and pressure applied.  An After Visit Summary was printed and given to the patient. Patient received prescription.  D/c education completed with patient/family including follow up instructions, medication list, d/c activities limitations if indicated, with other d/c instructions as indicated by MD - patient able to verbalize understanding, all questions fully answered.   Patient instructed to return to ED, call 911, or call MD for any changes in condition.   Patient escorted via [place], and D/C home via private auto.  [name] 10/04/2018 2:59 PM

## 2018-10-04 NOTE — TOC Transition Note (Addendum)
Transition of Care [facility]) - CM/SW Discharge Note   Patient Details  Name: [name] MRN: [id number] Date of Birth: 23-Sep-1940  Transition of Care [facility]) CM/SW Contact:  Benard Halsted, LCSW Phone Number: 10/04/2018, 12:57 PM   Clinical Narrative:    Patient will DC to: [facility] Anticipated DC date: 10/04/18 Family notified: Maudie Mercury, daughter Transport by: PTAR 1:30pm   Per MD patient ready for DC to [facility]. RN, patient, patient's family, and facility notified of DC. Discharge Summary and FL2 sent to facility. RN to call report prior to discharge [phone] Room 503). DC packet on chart. Ambulance transport requested for patient.   CSW will sign off for now as social work intervention is no longer needed. Please consult [place] again if new needs arise.  Cedric Fishman, LCSW Clinical Social Worker [phone]    Final next level of care: Skilled Nursing Facility Barriers to Discharge: No Barriers Identified   Patient Goals and CMS Choice Patient states their goals for this hospitalization and ongoing recovery are:: Rehab CMS Medicare.gov Compare Post Acute Care list provided to:: Patient Choice offered to / list presented to : Adult Children  Discharge Placement PASRR number recieved: 10/03/18            Patient chooses bed at: [facility] Patient to be transferred to facility by: Page Name of family member notified: Daughter, Maudie Mercury Patient and family notified of of transfer: 10/04/18  Discharge Plan and Services In-house Referral: Clinical Social Work Discharge Planning Services: [employer] Consult            DME Arranged: N/A DME Agency: NA         HH Agency: NA        Social Determinants of Health ([place]) Interventions     Readmission Risk Interventions Readmission Risk Prevention Plan 10/02/2018  Transportation Screening Complete  PCP or Specialist Appt within 5-7 Days Complete  Home Care Screening Complete  Medication Review (RN CM)  Complete  Some recent data might be hidden

## 2018-10-05 ENCOUNTER — Encounter: Payer: Self-pay | Admitting: Internal Medicine

## 2018-10-05 ENCOUNTER — Non-Acute Institutional Stay (SKILLED_NURSING_FACILITY): Payer: Medicare Other | Admitting: Internal Medicine

## 2018-10-05 DIAGNOSIS — G062 Extradural and subdural abscess, unspecified: Secondary | ICD-10-CM | POA: Diagnosis not present

## 2018-10-05 DIAGNOSIS — G9341 Metabolic encephalopathy: Secondary | ICD-10-CM

## 2018-10-05 DIAGNOSIS — G629 Polyneuropathy, unspecified: Secondary | ICD-10-CM

## 2018-10-05 DIAGNOSIS — G20 Parkinson's disease: Secondary | ICD-10-CM

## 2018-10-05 DIAGNOSIS — M4645 Discitis, unspecified, thoracolumbar region: Secondary | ICD-10-CM | POA: Diagnosis not present

## 2018-10-05 DIAGNOSIS — E785 Hyperlipidemia, unspecified: Secondary | ICD-10-CM

## 2018-10-05 DIAGNOSIS — I5032 Chronic diastolic (congestive) heart failure: Secondary | ICD-10-CM

## 2018-10-05 DIAGNOSIS — I480 Paroxysmal atrial fibrillation: Secondary | ICD-10-CM

## 2018-10-05 DIAGNOSIS — K219 Gastro-esophageal reflux disease without esophagitis: Secondary | ICD-10-CM

## 2018-10-05 NOTE — Progress Notes (Signed)
: Provider:  Noah Delaine. Sheppard Coil, MD Location:  [place] Room Number: [place]:  SNF ([phone])  PCP: [name], MD Patient Care Team: [name], MD as PCP - General (Family Medicine) Josue Hector, MD as PCP - Cardiology (Cardiology) Hazle Coca, MD (Inactive) as Consulting Physician (Neurosurgery) Tat, Eustace Quail, DO as Consulting Physician (Neurology)  Extended Emergency Contact Information Primary Emergency Contact: Pandora Leiter Address: [place], [place] [address] Johnnette Litter of [place] Phone: [phone] Mobile Phone: [phone] Relation: Spouse Secondary Emergency Contact: Nadeen Landau [place] of [place] Phone: [phone] Mobile Phone: [phone] Relation: Daughter     Allergies: Lyrica [pregabalin]; Zocor [simvastatin]; and Relafen [nabumetone]  Chief Complaint  Patient presents with   New Admit To SNF    Admit to Facility     HPI: Patient is 78 y.o. female with CHF, DJD, lumbar stenosis, paroxysmal atrial fib, and Parkinson's disease who was recently discharged from the hospital in February and treated for cholecystitis, and underwent laparoscopic cholecystectomy.  During that admission she had thoracolumbar MRI with several indicators of discitis and osteomyelitis at the level of T8 12 and L1.  It was determined that she did not have discitis and she was not discharged on antibiotics for the same.  After discharge patient continued to have back pain and she was followed up by neurosurgery as an outpatient and they ordered an MRI of the lumbar spine which showed an epidural abscess, worsening discitis T12-11 1.  Patient presented to the ED with worsening confusion, back pain that radiated to her leg, and 9/10, constant worse for 1 week.  Patient was admitted to [facility] from 4/20 2-29 where she underwent a T9-12 laminectomy for epidural abscess per Dr.  Venetia Constable on 5/36 uncomplicated.  CRP and ESR were still normal and at no point did patient have a fever.  Intraoperative and of both cultures have remained negative.  Infectious disease was sinus salted and she was placed on vancomycin and Rocephin which are to be continued until 11/23/2018.  A PICC line was placed for said antibiotics.  She was also placed on Decadron and oxycodone for pain.  Patient is admitted to skilled nursing facility for OT/PT and for 8 weeks of antibiotics ending on 11/23/2018.  While at skilled nursing facility patient will be followed for paroxysmal atrial fibrillation treated with metoprolol flecainide and warfarin, Parkinson's disease treated with Sinemet, and chronic diastolic congestive heart failure treated with metoprolol and Lasix.  Past Medical History:  Diagnosis Date   Allergic rhinitis    Arthritis    "all over"   CHF (congestive heart failure) (HCC)    Chronic back pain    DJD (degenerative joint disease)    GERD (gastroesophageal reflux disease)    Hyperlipidemia    Hypertension    Lumbar stenosis    OSA (obstructive sleep apnea)    "couldn't handle CPAP; use mouth guard some; not all the time" (01/06/2014)   PAF (paroxysmal atrial fibrillation) (HCC)    Scoliosis    with radiculopathy L2-S1 with prior surgery   Small bowel obstruction ([place])    versus ileus after last bck surgery   Spondylosis     Past Surgical History:  Procedure Laterality Date   BACK SURGERY     CATARACT EXTRACTION W/ INTRAOCULAR LENS  IMPLANT, BILATERAL  2011   CHOLECYSTECTOMY N/A 07/07/2018   Procedure:  LAPAROSCOPIC CHOLECYSTECTOMY;  Surgeon: Erroll Luna, MD;  Location: [facility];  Service: General;  Laterality: N/A;   COLONOSCOPY  12/09/2004   LIPOMA EXCISION  1980's   "fatty tumors"   [place] SURGERY  02/2009   "ruptured disc"   NASAL SEPTUM SURGERY  80's   POSTERIOR LUMBAR FUSION  06/2010; 10/2011   "placed screws, rods, spacers both times"    REPAIR DURAL / CSF LEAK  02/2009   THORACIC LAMINECTOMY FOR EPIDURAL ABSCESS N/A 09/28/2018   Procedure: THORACIC NINE-THORACIC TEN, THORACIC TEN-THORACIC ELEVEN, THORACIC ELEVEN-THORACIC TWELVE LAMINECTOMIES FOR EPIDURAL ABSCESS;  Surgeon: Judith Part, MD;  Location: [facility];  Service: Neurosurgery;  Laterality: N/A;   TOTAL ABDOMINAL HYSTERECTOMY  02/1993    Allergies as of 10/05/2018      Reactions   Lyrica [pregabalin] Other (See Comments)   Felt loopy   Zocor [simvastatin] Other (See Comments)   Myalgias   Relafen [nabumetone] Rash      Medication List       Accurate as of October 05, 2018  9:50 AM. Always use your most recent med list.        acetaminophen 325 MG tablet Commonly known as:  TYLENOL Take 325 mg by mouth daily as needed for mild pain.   calcium carbonate 500 MG chewable tablet Commonly known as:  TUMS - dosed in mg elemental calcium Chew 1 tablet by mouth as needed for indigestion or heartburn.   carbidopa-levodopa 25-100 MG tablet Commonly known as:  SINEMET IR Take 2 tablets by mouth 3 (three) times daily.   cefTRIAXone  IVPB Commonly known as:  ROCEPHIN Inject 2 g into the vein daily. Indication: Subdural abscess, empiric treatment  Last Day of Therapy: 11/23/2018 Labs - Once weekly:  CBC/D and BMP, Labs - Every other week:  ESR and CRP   dexamethasone 4 MG tablet Commonly known as:  DECADRON Take 1 tablet (4 mg total) by mouth daily for 3 doses.   flecainide 50 MG tablet Commonly known as:  TAMBOCOR TAKE 1 TABLET BY MOUTH 2 TIMES DAILY   furosemide 20 MG tablet Commonly known as:  LASIX Take 40 mg by mouth as needed for fluid.   gabapentin 300 MG capsule Commonly known as:  NEURONTIN Take 300 mg by mouth 2 (two) times daily.   metoprolol succinate 100 MG 24 hr tablet Commonly known as:  TOPROL-XL TAKE 1 TABLET BY MOUTH DAILY   oxyCODONE 5 MG immediate release tablet Commonly known as:  Oxy IR/ROXICODONE Take 1 tablet (5 mg total)  by mouth every 6 (six) hours as needed for severe pain. For 14 days   pantoprazole 40 MG tablet Commonly known as:  PROTONIX Take 40 mg by mouth 2 (two) times daily.   polyethylene glycol 17 g packet Commonly known as:  MIRALAX / GLYCOLAX Take 17 g by mouth daily.   potassium chloride SA 20 MEQ tablet Commonly known as:  K-DUR Take 40 mEq by mouth daily.   pramipexole 0.5 MG tablet Commonly known as:  MIRAPEX Take 1 mg by mouth at bedtime. For restless legs   pravastatin 40 MG tablet Commonly known as:  PRAVACHOL Take 40 mg by mouth at bedtime.   PROBIOTIC-10 PO Take 1 capsule by mouth daily.   Travatan Z 0.004 % Soln ophthalmic solution Generic drug:  Travoprost (BAK Free) Place 1 drop into both eyes at bedtime.   vancomycin  IVPB Inject 1,250 mg into the vein daily. Indication: subdural abscess  Last  Day of Therapy: 11/23/2018 Labs - Sunday/Monday:  CBC/D, BMP, and vancomycin trough. Labs - Thursday:  BMP and vancomycin trough Labs - Every other week:  ESR and CRP   warfarin 2.5 MG tablet Commonly known as:  Coumadin Take as directed by the anticoagulation clinic. If you are unsure how to take this medication, talk to your nurse or doctor. Original instructions:  Take 1 tablet (2.5 mg total) by mouth as directed.       No orders of the defined types were placed in this encounter.   Immunization History  Administered Date(s) Administered   DTaP 02/24/2005   H1N1 03/27/2008   Influenza Split 03/14/2009, 03/20/2010, 04/14/2011, 03/02/2012, 03/26/2013, 02/27/2015, 03/07/2016   Influenza Whole 03/20/2010   Influenza, High Dose Seasonal PF 03/01/2014, 02/27/2015, 03/30/2017, 02/08/2018   Influenza-Unspecified 03/07/2014   Pneumococcal Conjugate-13 04/17/2013, 07/11/2015   Pneumococcal Polysaccharide-23 05/12/2009   Tdap 02/24/2005, 05/21/2011   Zoster 05/18/2010, 03/22/2017   Zoster Recombinat (Shingrix) 09/07/2017    Social History   Tobacco Use     Smoking status: Never Smoker   Smokeless tobacco: Never Used  Substance Use Topics   Alcohol use: No    Alcohol/week: 0.0 standard drinks    Comment: 02/01/2014 " glasseof wine once in a blue moon; < once/year"    Family history is   Family History  Problem Relation Age of Onset   Arthritis Mother    Heart attack Father    Hypertension Sister    Lung cancer Sister    Asthma Sister    Allergies Sister       Review of Systems  DATA OBTAINED: from patient, nurse GENERAL:  no fevers, fatigue, appetite changes SKIN: No itching, or rash EYES: No eye pain, redness, discharge EARS: No earache, tinnitus, change in hearing NOSE: No congestion, drainage or bleeding  MOUTH/THROAT: No mouth or tooth pain, No sore throat RESPIRATORY: No cough, wheezing, SOB CARDIAC: No chest pain, palpitations, lower extremity edema  GI: No abdominal pain, No N/V/D or constipation, No heartburn or reflux  GU: No dysuria, frequency or urgency, or incontinence  MUSCULOSKELETAL: Continue leg pain NEUROLOGIC: No headache, dizziness or focal weakness PSYCHIATRIC: No c/o anxiety or sadness   Vitals:   10/05/18 0939  BP: (!) 174/70  Pulse: 80  Temp: (!) 97.4 F (36.3 C)  SpO2: 97%    SpO2 Readings from Last 1 Encounters:  10/05/18 97%   Body mass index is 31 kg/m.     Physical Exam  GENERAL APPEARANCE: Alert, conversant,  No acute distress; patient seems to have no discomfort sitting up.  SKIN: No diaphoresis rash HEAD: Normocephalic, atraumatic  EYES: Conjunctiva/lids clear. Pupils round, reactive. EOMs intact.  EARS: External exam WNL, canals clear. Hearing grossly normal.  NOSE: No deformity or discharge.  MOUTH/THROAT: Lips w/o lesions  RESPIRATORY: Breathing is even, unlabored. Lung sounds are clear   CARDIOVASCULAR: Heart RRR no murmurs, rubs or gallops. No peripheral edema.   GASTROINTESTINAL: Abdomen is soft, non-tender, not distended w/ normal bowel  sounds. GENITOURINARY: Bladder non tender, not distended  MUSCULOSKELETAL: No abnormal joints or musculature NEUROLOGIC:  Cranial nerves 2-12 grossly intact. 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   Glaucoma 07/01/2018   Chronic kidney disease (CKD), stage III (moderate) ([place]) 07/01/2018  Neuropathy 07/01/2018   Pure hypercholesterolemia 07/01/2018   Restless leg 07/01/2018   Discitis of thoracolumbar region 07/01/2018   Acute bronchitis due to infection 05/04/2017   Cough 04/21/2016   Preoperative cardiovascular examination 04/18/2015   Parkinsonism ([place]) 05/15/2014   Chronic anticoagulation -warfarin therapy 02/03/2014   Atrial fibrillation with RVR ([place]) 01/31/2014   Gait instability 11/16/2013   Encounter for therapeutic drug monitoring 07/20/2013   Edema 03/03/2010   ATRIAL FIBRILLATION 04/23/2008   Elevated lipids 01/31/2008   Obstructive sleep apnea 01/31/2008   GLAUCOMA 01/30/2008   Seasonal and perennial allergic rhinitis 01/30/2008   Osteoarthritis 01/30/2008      Labs reviewed: Basic Metabolic Panel:    Component Value Date/Time   NA 140 10/04/2018 0317   K 3.5 10/04/2018 0317   CL 107 10/04/2018 0317   CO2 20 (L) 10/04/2018 0317   GLUCOSE 106 (H) 10/04/2018 0317   BUN 11 10/04/2018 0317   CREATININE 0.71 10/04/2018 0317   CALCIUM 8.8 (L) 10/04/2018 0317   PROT 5.9 (L) 09/28/2018 0323   ALBUMIN 3.5 09/28/2018 0323   AST 20 09/28/2018 0323   ALT 7 09/28/2018 0323   ALKPHOS 88 09/28/2018 0323   BILITOT 1.1 09/28/2018 0323   GFRNONAA >60 10/04/2018 0317   GFRAA >60 10/04/2018 0317    Recent Labs    07/07/18 0343 07/08/18 0556  07/09/18 0408  10/02/18 0526 10/03/18 0329  10/04/18 0317  NA 139 143   < >  --    < > 139 138 140  K 4.2 3.9   < >  --    < > 3.5 3.2* 3.5  CL 107 107   < >  --    < > 105 105 107  CO2 24 24   < >  --    < > 24 24 20*  GLUCOSE 90 137*   < >  --    < > 127* 98 106*  BUN 24* 14   < >  --    < > _0 CREATININE 0.98 0.89   < >  --    < > 0.68 0.81 0.71  CALCIUM 9.4 9.9   < >  --    < > 8.7* 8.7* 8.8*  MG 1.8 1.8  --  1.9  --   --   --   --   PHOS 3.6 3.6  --  3.0  --   --   --   --    < > = values in this interval not displayed.   Liver Function Tests: Recent Labs    07/08/18 0556 09/27/18 1425 09/28/18 0323  AST _1 ALT _2 ALKPHOS 89 89 88  BILITOT 0.6 1.1 1.1  PROT 7.0 5.8* 5.9*  ALBUMIN 4.0 3.5 3.5   Recent Labs    07/01/18 0057  LIPASE 30   No results for input(s): AMMONIA in the last 8760 hours. CBC: Recent Labs    07/08/18 0556 07/09/18 0408 09/27/18 1425  10/02/18 0526 10/03/18 0329 10/04/18 0317  WBC 8.9 8.3 12.0*   < > 10.3 7.8 9.0  NEUTROABS 8.0* 6.7 10.6*  --   --   --   --   HGB 12.6 12.6 13.0   < > 11.9* 11.6* 12.2  HCT 40.7 41.0 41.5   < > 36.5 35.8* 36.9  MCV 85.1 86.1 86.5   < > 85.3 84.6 84.1  PLT 162 171 171   < >  129* 147* 164   < > = values in this interval not displayed.   Lipid No results for input(s): CHOL, HDL, LDLCALC, TRIG in the last 8760 hours.  Cardiac Enzymes: No results for input(s): CKTOTAL, CKMB, CKMBINDEX, TROPONINI in the last 8760 hours. BNP: No results for input(s): BNP in the last 8760 hours. No results found for: [facility] Lab Results  Component Value Date   HGBA1C 6.0 (H) 02/27/2013   Lab Results  Component Value Date   TSH 2.350 02/01/2014   Lab Results  Component Value Date   VITAMINB12 439 02/27/2013   Lab Results  Component Value Date   FOLATE  07/11/2010    >20.0 (NOTE)  Reference Ranges        Deficient:       0.4 - 3.3 ng/mL        Indeterminate:   3.4 - 5.4 ng/mL        Normal:              > 5.4 ng/mL   Lab Results   Component Value Date   IRON 26 (L) 07/11/2010   TIBC 214 (L) 07/11/2010   FERRITIN 217 07/11/2010    Imaging and Procedures obtained prior to SNF admission: Dg Thoracolumabar Spine  Result Date: 09/28/2018 CLINICAL DATA:  Localization image. Limited ectomy for epidural abscess. EXAM: THORACOLUMBAR SPINE 1V COMPARISON:  None. FINDINGS: Single lateral intraoperative fluoroscopic images provided. Based on the lumbar spine MRI of 09/27/2018, surgical instruments overlie the posterior elements of the T12 vertebral body. Fluoroscopy provided for 7 seconds. IMPRESSION: Surgical instruments overlying the posterior elements of the T12 vertebral body. Electronically Signed   By: Franki Cabot M.D.   On: 09/28/2018 14:17   Mr Thoracic Spine W Wo Contrast  Result Date: 09/27/2018 CLINICAL DATA:  Low back pain. Lumbar MRI demonstrated epidural abscess extending into the thoracic spine. EXAM: MRI THORACIC WITHOUT AND WITH CONTRAST TECHNIQUE: Multiplanar and multiecho pulse sequences of the thoracic spine were obtained without and with intravenous contrast. CONTRAST:  8 mL Gadovist IV COMPARISON:  Lumbar MRI 09/27/2018. Thoracic and lumbar MRI 07/01/2018 FINDINGS: MRI THORACIC SPINE FINDINGS Alignment:  Normal Vertebrae: Changes of discitis and osteomyelitis at T12-L1. There is increased fluid within the disc space with considerable endplate destruction which has progressed since 07/01/2018. Irregular enhancement of the endplates postcontrast administration. Pedicle screw fusion bilaterally extending from L1 through S1. No other areas of infection or fracture in the thoracic spine Cord: Large epidural abscess in the spinal canal arising from T12-L1 and extending superiorly. Multilocular fluid collection is present in the ventral epidural space extending cranially to the T9-10 disc space. There is cord compression at T11-12 and T12-L1. Possible gas within the epidural abscess at T10. Paraspinal and other soft  tissues: Paraspinous soft tissue thickening at T12-L1 extending into the psoas muscle bilaterally compatible with developing small abscess. Disc levels: Thoracic disc degeneration as below. T7-8: Small right-sided spurring with slight cord flattening. No significant stenosis T8-9: Small central disc protrusion T9-10: Small left-sided disc protrusion T10-11: Large ventral epidural abscess with cord compression T11-12: Large ventral epidural abscess extending to the left of midline with cord compression T12-L1: Discitis and osteomyelitis with endplate destruction and bilateral facet degeneration. Moderate spinal stenosis. IMPRESSION: Findings consistent with discitis and osteomyelitis at T12-L1. Large epidural abscess extends from the T12-L1 disc space to the T9-10 disc space. There is cord compression at T11 and T12 due to the large abscess. Paraspinous soft tissue infection and early psoas  abscess bilaterally at T12-L1. These results were called by telephone at the time of interpretation on 09/27/2018 at 6:09 pm to Dr. Vanita Panda , who verbally acknowledged these results. Electronically Signed   By: Franchot Gallo M.D.   On: 09/27/2018 18:10   Dg C-arm 1-60 Min  Result Date: 09/28/2018 CLINICAL DATA:  Localization image. Limited ectomy for epidural abscess. EXAM: THORACOLUMBAR SPINE 1V COMPARISON:  None. FINDINGS: Single lateral intraoperative fluoroscopic images provided. Based on the lumbar spine MRI of 09/27/2018, surgical instruments overlie the posterior elements of the T12 vertebral body. Fluoroscopy provided for 7 seconds. IMPRESSION: Surgical instruments overlying the posterior elements of the T12 vertebral body. Electronically Signed   By: Franki Cabot M.D.   On: 09/28/2018 14:17     Not all labs, radiology exams or other studies done during hospitalization come through on my EPIC note; however they are reviewed by me.    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 3 times daily  Paroxysmal atrial fibrillation SNF- chronic and stable; continue metoprolol XL 100 mg daily, flecainide 50 mg twice daily and warfarin for prophylaxis 2.5 mg daily to restart on 5/68  Chronic diastolic congestive heart failure-euvolemic SNF-continue metoprolol 100 mg daily and Lasix 20 mg daily- note this is a change from 40 mg as needed  GERD SNF- not stated as uncontrolled; continue Protonix 40 mg twice daily  Hyperlipidemia SNF-not stated as uncontrolled; continue Pravachol 40 mg nightly  Neuropathy SNF- patient's creatinine clearance is 34, therefore can increase her Neurontin from 300 twice daily to 600 twice daily to address her leg pain  Time spent greater than 45 minutes;> 50% of time with patient was spent reviewing records, labs, tests and studies, counseling and developing plan of care  Webb Silversmith D. Sheppard Coil, MD

## 2018-10-06 DIAGNOSIS — M4645 Discitis, unspecified, thoracolumbar region: Secondary | ICD-10-CM | POA: Diagnosis not present

## 2018-10-07 ENCOUNTER — Encounter: Payer: Self-pay | Admitting: Internal Medicine

## 2018-10-07 DIAGNOSIS — G9341 Metabolic encephalopathy: Secondary | ICD-10-CM | POA: Insufficient documentation

## 2018-10-07 DIAGNOSIS — K219 Gastro-esophageal reflux disease without esophagitis: Secondary | ICD-10-CM | POA: Insufficient documentation

## 2018-10-07 DIAGNOSIS — I5032 Chronic diastolic (congestive) heart failure: Secondary | ICD-10-CM | POA: Insufficient documentation

## 2018-10-08 DIAGNOSIS — I10 Essential (primary) hypertension: Secondary | ICD-10-CM | POA: Diagnosis not present

## 2018-10-08 DIAGNOSIS — D649 Anemia, unspecified: Secondary | ICD-10-CM | POA: Diagnosis not present

## 2018-10-09 DIAGNOSIS — E7800 Pure hypercholesterolemia, unspecified: Secondary | ICD-10-CM | POA: Diagnosis not present

## 2018-10-09 DIAGNOSIS — I10 Essential (primary) hypertension: Secondary | ICD-10-CM | POA: Diagnosis not present

## 2018-10-09 DIAGNOSIS — D649 Anemia, unspecified: Secondary | ICD-10-CM | POA: Diagnosis not present

## 2018-10-09 DIAGNOSIS — M2550 Pain in unspecified joint: Secondary | ICD-10-CM | POA: Diagnosis not present

## 2018-10-10 ENCOUNTER — Encounter: Payer: Self-pay | Admitting: Internal Medicine

## 2018-10-10 ENCOUNTER — Non-Acute Institutional Stay (SKILLED_NURSING_FACILITY): Payer: Medicare Other | Admitting: Internal Medicine

## 2018-10-10 DIAGNOSIS — M4645 Discitis, unspecified, thoracolumbar region: Secondary | ICD-10-CM

## 2018-10-10 DIAGNOSIS — G062 Extradural and subdural abscess, unspecified: Secondary | ICD-10-CM | POA: Diagnosis not present

## 2018-10-10 NOTE — Progress Notes (Signed)
Location:  [facility] Room Number: 643-P Place of Service:  SNF [phone])  Provider: Hennie Duos MD  [name], MD  Patient Care Team: [name], MD as PCP - General (Family Medicine) Josue Hector, MD as PCP - Cardiology (Cardiology) Hazle Coca, MD (Inactive) as Consulting Physician (Neurosurgery) Tat, Eustace Quail, DO as Consulting Physician (Neurology)  Extended Emergency Contact Information Primary Emergency Contact: Pandora Leiter Address: [address] [place], [place] [address] Johnnette Litter of [name] Phone: [phone] Mobile Phone: [phone] Relation: Spouse Secondary Emergency Contact: Nadeen Landau [place] of [place] Phone: [phone] Mobile Phone: [phone] Relation: Daughter    Allergies: Lyrica [pregabalin]; Zocor [simvastatin]; and Relafen [nabumetone]  Chief Complaint  Patient presents with  . Acute Visit    Patient is seen for a wound check.    HPI: Patient is 78 y.o. female who Wound care nurse is asked me to see.  There is an area of swelling around the proximal wound incision that she is not sure could be an abscess or infection.  Patient has not had any fever chills nausea vomiting or any sign of infection.  Patient denies any increased pain into the area.  Past Medical History:  Diagnosis Date  . Allergic rhinitis   . Arthritis    "all over"  . CHF (congestive heart failure) ([place])   . Chronic back pain   . DJD (degenerative joint disease)   . GERD (gastroesophageal reflux disease)   . Hyperlipidemia   . Hypertension   . Lumbar stenosis   . OSA (obstructive sleep apnea)    "couldn't handle CPAP; use mouth guard some; not all the time" (01/06/2014)  . PAF (paroxysmal atrial fibrillation) ([place])   . Scoliosis    with radiculopathy L2-S1 with prior surgery  . Small bowel obstruction ([facility])    versus ileus after last bck surgery  . Spondylosis     Past  Surgical History:  Procedure Laterality Date  . BACK SURGERY    . CATARACT EXTRACTION W/ INTRAOCULAR LENS  IMPLANT, BILATERAL  2011  . CHOLECYSTECTOMY N/A 07/07/2018   Procedure: LAPAROSCOPIC CHOLECYSTECTOMY;  Surgeon: Erroll Luna, MD;  Location: [facility];  Service: General;  Laterality: N/A;  . COLONOSCOPY  12/09/2004  . LIPOMA EXCISION  1980's   "fatty tumors"  . [place] SURGERY  02/2009   "ruptured disc"  . NASAL SEPTUM SURGERY  80's  . POSTERIOR LUMBAR FUSION  06/2010; 10/2011   "placed screws, rods, spacers both times"  . REPAIR DURAL / CSF LEAK  02/2009  . THORACIC LAMINECTOMY FOR EPIDURAL ABSCESS N/A 09/28/2018   Procedure: THORACIC NINE-THORACIC TEN, THORACIC TEN-THORACIC ELEVEN, THORACIC ELEVEN-THORACIC TWELVE LAMINECTOMIES FOR EPIDURAL ABSCESS;  Surgeon: Judith Part, MD;  Location: [facility];  Service: Neurosurgery;  Laterality: N/A;  . TOTAL ABDOMINAL HYSTERECTOMY  02/1993    Allergies as of 10/10/2018      Reactions   Lyrica [pregabalin] Other (See Comments)   Felt loopy   Zocor [simvastatin] Other (See Comments)   Myalgias   Relafen [nabumetone] Rash      Medication List       Accurate as of Oct 10, 2018  2:52 PM. Always use your most recent med list.        acetaminophen 325 MG tablet Commonly known as:  TYLENOL Take 325 mg by mouth daily as needed for mild pain.   calcium carbonate 500  MG chewable tablet Commonly known as:  TUMS - dosed in mg elemental calcium Chew 1 tablet by mouth 2 (two) times daily as needed for indigestion or heartburn.   carbidopa-levodopa 25-100 MG tablet Commonly known as:  SINEMET IR Take 2 tablets by mouth 3 (three) times daily.   cefTRIAXone  IVPB Commonly known as:  ROCEPHIN Inject 2 g into the vein daily. Indication: Subdural abscess, empiric treatment  Last Day of Therapy: 11/23/2018 Labs - Once weekly:  CBC/D and BMP, Labs - Every other week:  ESR and CRP   flecainide 50 MG tablet Commonly known as:  TAMBOCOR TAKE 1  TABLET BY MOUTH 2 TIMES DAILY   furosemide 20 MG tablet Commonly known as:  LASIX Take 20 mg by mouth daily.   gabapentin 600 MG tablet Commonly known as:  NEURONTIN Take 600 mg by mouth 2 (two) times daily.   metoprolol succinate 100 MG 24 hr tablet Commonly known as:  TOPROL-XL TAKE 1 TABLET BY MOUTH DAILY   oxyCODONE 5 MG immediate release tablet Commonly known as:  Oxy IR/ROXICODONE Take 1 tablet (5 mg total) by mouth every 6 (six) hours as needed for severe pain. For 14 days   pantoprazole 40 MG tablet Commonly known as:  PROTONIX Take 40 mg by mouth 2 (two) times daily.   polyethylene glycol 17 g packet Commonly known as:  MIRALAX / GLYCOLAX Take 17 g by mouth daily.   potassium chloride SA 20 MEQ tablet Commonly known as:  K-DUR Take 40 mEq by mouth daily.   pramipexole 1 MG tablet Commonly known as:  MIRAPEX Take 1 mg by mouth at bedtime.   pravastatin 40 MG tablet Commonly known as:  PRAVACHOL Take 40 mg by mouth at bedtime.   saccharomyces boulardii 250 MG capsule Commonly known as:  FLORASTOR Take 250 mg by mouth daily.   Travatan Z 0.004 % Soln ophthalmic solution Generic drug:  Travoprost (BAK Free) Place 1 drop into both eyes at bedtime.   Vancomycin HCl in NaCl 750-0.9 MG/150ML-% Soln Inject 750 mg into the vein every 12 (twelve) hours.   warfarin 2.5 MG tablet Commonly known as:  Coumadin Take as directed by the anticoagulation clinic. If you are unsure how to take this medication, talk to your nurse or doctor. Original instructions:  Take 1 tablet (2.5 mg total) by mouth as directed.       No orders of the defined types were placed in this encounter.   Immunization History  Administered Date(s) Administered  . DTaP 02/24/2005  . H1N1 03/27/2008  . Influenza Split 03/14/2009, 03/20/2010, 04/14/2011, 03/02/2012, 03/26/2013, 02/27/2015, 03/07/2016  . Influenza Whole 03/20/2010  . Influenza, High Dose Seasonal PF 03/01/2014, 02/27/2015,  03/30/2017, 02/08/2018  . Influenza-Unspecified 03/07/2014  . Pneumococcal Conjugate-13 04/17/2013, 07/11/2015  . Pneumococcal Polysaccharide-23 05/12/2009  . Tdap 02/24/2005, 05/21/2011  . Zoster 05/18/2010, 03/22/2017  . Zoster Recombinat (Shingrix) 09/07/2017    Social History   Tobacco Use  . Smoking status: Never Smoker  . Smokeless tobacco: Never Used  Substance Use Topics  . Alcohol use: No    Alcohol/week: 0.0 standard drinks    Comment: 02/01/2014 " glasseof wine once in a blue moon; < once/year"    Review of Systems  DATA OBTAINED: from patient, nurse GENERAL:  no fevers, fatigue, appetite changes SKIN: No itching, rash HEENT: No complaint RESPIRATORY: No cough, wheezing, SOB CARDIAC: No chest pain, palpitations, lower extremity edema  GI: No abdominal pain, No N/V/D or constipation, No  heartburn or reflux  GU: No dysuria, frequency or urgency, or incontinence  MUSCULOSKELETAL: No unrelieved bone/joint pain NEUROLOGIC: No headache, dizziness  PSYCHIATRIC: No overt anxiety or sadness  Vitals:   10/10/18 1422  BP: (!) 148/77  Pulse: 78  Resp: 18  Temp: 98 F (36.7 C)  SpO2: 97%   Body mass index is 28.89 kg/m. Physical Exam  GENERAL APPEARANCE: Alert, conversant, No acute distress  SKIN: There is a J shaped area of mild swelling across the proximal and around the proximal incision site; incision area is firm, it is not red is not tender it is not hot HEENT: Unremarkable RESPIRATORY: Breathing is even, unlabored. Lung sounds are clear   CARDIOVASCULAR: Heart RRR no murmurs, rubs or gallops. No peripheral edema  GASTROINTESTINAL: Abdomen is soft, non-tender, not distended w/ normal bowel sounds.  GENITOURINARY: Bladder non tender, not distended  MUSCULOSKELETAL: No abnormal joints or musculature NEUROLOGIC: Cranial nerves 2-12 grossly intact. Moves all extremities PSYCHIATRIC: Mood and affect appropriate to situation, no behavioral issues  Patient Active  Problem List   Diagnosis Date Noted  . Acute metabolic encephalopathy 62/37/6283  . Chronic diastolic (congestive) heart failure ([facility]) 10/07/2018  . GERD (gastroesophageal reflux disease) 10/07/2018  . 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 Abnormal glucose tolerance test 07/01/2018  . Colon, diverticulosis 07/01/2018  . 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 Glaucoma 07/01/2018  . Chronic kidney disease (CKD), stage III (moderate) ([facility]) 07/01/2018  . Neuropathy 07/01/2018  . Pure hypercholesterolemia 07/01/2018  . Restless leg 07/01/2018  . Discitis of thoracolumbar region 07/01/2018  . Acute bronchitis due to infection 05/04/2017  . Cough 04/21/2016  . Preoperative cardiovascular examination 04/18/2015  . Parkinsonism ([facility] [place]) 05/15/2014  . Chronic anticoagulation -warfarin therapy 02/03/2014  . Atrial fibrillation with RVR ([place] [facility]) 01/31/2014  . Gait instability 11/16/2013  . Encounter for therapeutic drug monitoring 07/20/2013  . Edema 03/03/2010  . ATRIAL FIBRILLATION 04/23/2008  . Hyperlipidemia 01/31/2008  . Obstructive sleep apnea 01/31/2008  . GLAUCOMA 01/30/2008  . Seasonal and perennial allergic rhinitis 01/30/2008  . Osteoarthritis 01/30/2008    CMP     Component Value Date/Time   NA 140 10/04/2018 0317   K 3.5 10/04/2018 0317   CL 107 10/04/2018 0317   CO2 20 (L) 10/04/2018 0317   GLUCOSE 106 (H) 10/04/2018 0317   BUN 11 10/04/2018 0317   CREATININE 0.71 10/04/2018 0317   CALCIUM 8.8 (L) 10/04/2018 0317   PROT 5.9 (L) 09/28/2018 0323   ALBUMIN 3.5 09/28/2018 0323   AST 20 09/28/2018 0323   ALT 7 09/28/2018 0323   ALKPHOS 88 09/28/2018 0323   BILITOT 1.1 09/28/2018 0323   GFRNONAA >60 10/04/2018 0317   GFRAA >60 10/04/2018 0317   Recent Labs    07/07/18 0343 07/08/18 0556  07/09/18 0408  10/02/18 0526 10/03/18 0329 10/04/18 0317  NA 139 143   < >  --    < > 139 138 140  K 4.2 3.9   < >  --    < > 3.5 3.2* 3.5  CL 107 107   < >  --    < > 105 105 107  CO2 24 24   < >  --     < > 24 24 20*  GLUCOSE 90 137*   < >  --    < > 127* 98 106*  BUN 24* 14   < >  --    < > '15 16 11  ' CREATININE 0.98 0.89   < >  --    < >  0.68 0.81 0.71  CALCIUM 9.4 9.9   < >  --    < > 8.7* 8.7* 8.8*  MG 1.8 1.8  --  1.9  --   --   --   --   PHOS 3.6 3.6  --  3.0  --   --   --   --    < > = values in this interval not displayed.   Recent Labs    07/08/18 0556 09/27/18 1425 09/28/18 0323  AST '28 23 20  ' ALT '7 14 7  ' ALKPHOS 89 89 88  BILITOT 0.6 1.1 1.1  PROT 7.0 5.8* 5.9*  ALBUMIN 4.0 3.5 3.5   Recent Labs    07/08/18 0556 07/09/18 0408 09/27/18 1425  10/02/18 0526 10/03/18 0329 10/04/18 0317  WBC 8.9 8.3 12.0*   < > 10.3 7.8 9.0  NEUTROABS 8.0* 6.7 10.6*  --   --   --   --   HGB 12.6 12.6 13.0   < > 11.9* 11.6* 12.2  HCT 40.7 41.0 41.5   < > 36.5 35.8* 36.9  MCV 85.1 86.1 86.5   < > 85.3 84.6 84.1  PLT 162 171 171   < > 129* 147* 164   < > = values in this interval not displayed.   No results for input(s): CHOL, LDLCALC, TRIG in the last 8760 hours.  Invalid input(s): HCL No results found for: [facility] Lab Results  Component Value Date   TSH 2.350 02/01/2014   Lab Results  Component Value Date   HGBA1C 6.0 (H) 02/27/2013   Lab Results  Component Value Date   CHOL  12/22/2006    183        ATP III CLASSIFICATION:  <200     mg/dL   Desirable  200-239  mg/dL   Borderline High  >=240    mg/dL   High   HDL 47 12/22/2006   LDLCALC (H) 12/22/2006    101        Total Cholesterol/HDL:CHD Risk Coronary Heart Disease Risk Table                     Men   Women  1/2 Average Risk   3.4   3.3   TRIG 174 (H) 12/22/2006   CHOLHDL 3.9 12/22/2006    Significant Diagnostic Results in last 30 days:  Dg Thoracolumabar Spine  Result Date: 09/28/2018 CLINICAL DATA:  Localization image. Limited ectomy for epidural abscess. EXAM: THORACOLUMBAR SPINE 1V COMPARISON:  None. FINDINGS: Single lateral intraoperative fluoroscopic images provided. Based on the lumbar spine  MRI of 09/27/2018, surgical instruments overlie the posterior elements of the T12 vertebral body. Fluoroscopy provided for 7 seconds. IMPRESSION: Surgical instruments overlying the posterior elements of the T12 vertebral body. Electronically Signed   By: Franki Cabot M.D.   On: 09/28/2018 14:17   Mr Thoracic Spine W Wo Contrast  Result Date: 09/27/2018 CLINICAL DATA:  Low back pain. Lumbar MRI demonstrated epidural abscess extending into the thoracic spine. EXAM: MRI THORACIC WITHOUT AND WITH CONTRAST TECHNIQUE: Multiplanar and multiecho pulse sequences of the thoracic spine were obtained without and with intravenous contrast. CONTRAST:  8 mL Gadovist IV COMPARISON:  Lumbar MRI 09/27/2018. Thoracic and lumbar MRI 07/01/2018 FINDINGS: MRI THORACIC SPINE FINDINGS Alignment:  Normal Vertebrae: Changes of discitis and osteomyelitis at T12-L1. There is increased fluid within the disc space with considerable endplate destruction which has progressed since 07/01/2018. Irregular enhancement of the endplates postcontrast administration.  Pedicle screw fusion bilaterally extending from L1 through S1. No other areas of infection or fracture in the thoracic spine Cord: Large epidural abscess in the spinal canal arising from T12-L1 and extending superiorly. Multilocular fluid collection is present in the ventral epidural space extending cranially to the T9-10 disc space. There is cord compression at T11-12 and T12-L1. Possible gas within the epidural abscess at T10. Paraspinal and other soft tissues: Paraspinous soft tissue thickening at T12-L1 extending into the psoas muscle bilaterally compatible with developing small abscess. Disc levels: Thoracic disc degeneration as below. T7-8: Small right-sided spurring with slight cord flattening. No significant stenosis T8-9: Small central disc protrusion T9-10: Small left-sided disc protrusion T10-11: Large ventral epidural abscess with cord compression T11-12: Large ventral  epidural abscess extending to the left of midline with cord compression T12-L1: Discitis and osteomyelitis with endplate destruction and bilateral facet degeneration. Moderate spinal stenosis. IMPRESSION: Findings consistent with discitis and osteomyelitis at T12-L1. Large epidural abscess extends from the T12-L1 disc space to the T9-10 disc space. There is cord compression at T11 and T12 due to the large abscess. Paraspinous soft tissue infection and early psoas abscess bilaterally at T12-L1. These results were called by telephone at the time of interpretation on 09/27/2018 at 6:09 pm to Dr. Vanita Panda , who verbally acknowledged these results. Electronically Signed   By: Franchot Gallo M.D.   On: 09/27/2018 18:10   Dg C-arm 1-60 Min  Result Date: 09/28/2018 CLINICAL DATA:  Localization image. Limited ectomy for epidural abscess. EXAM: THORACOLUMBAR SPINE 1V COMPARISON:  None. FINDINGS: Single lateral intraoperative fluoroscopic images provided. Based on the lumbar spine MRI of 09/27/2018, surgical instruments overlie the posterior elements of the T12 vertebral body. Fluoroscopy provided for 7 seconds. IMPRESSION: Surgical instruments overlying the posterior elements of the T12 vertebral body. Electronically Signed   By: Franki Cabot M.D.   On: 09/28/2018 14:17   [place] Ekg Site Rite  Result Date: 10/03/2018 If Site Rite image not attached, placement could not be confirmed due to current cardiac rhythm.  [place] Ekg Site Rite  Result Date: 10/01/2018 If Site Rite image not attached, placement could not be confirmed due to current cardiac rhythm.   Assessment and Plan  Epidural abscess/discitis of thoraco-lumbar region- the incisional area and surrounding tissue to look okay to me.  I think the swelling is just a healing reaction and not an infection; will continue to monitor    No problem-specific Assessment & Plan notes found for this encounter.   Labs/tests ordered:    Noah Delaine. Sheppard Coil, MD

## 2018-10-12 ENCOUNTER — Other Ambulatory Visit: Payer: Self-pay | Admitting: Internal Medicine

## 2018-10-12 DIAGNOSIS — G062 Extradural and subdural abscess, unspecified: Secondary | ICD-10-CM | POA: Diagnosis not present

## 2018-10-12 DIAGNOSIS — D649 Anemia, unspecified: Secondary | ICD-10-CM | POA: Diagnosis not present

## 2018-10-12 DIAGNOSIS — I10 Essential (primary) hypertension: Secondary | ICD-10-CM | POA: Diagnosis not present

## 2018-10-12 MED ORDER — OXYCODONE HCL 5 MG PO TABS: 5.0000 mg | ORAL_TABLET | Freq: Three times a day (TID) | ORAL | 0 refills | Status: DC

## 2018-10-13 ENCOUNTER — Encounter: Payer: Self-pay | Admitting: Internal Medicine

## 2018-10-13 ENCOUNTER — Non-Acute Institutional Stay (SKILLED_NURSING_FACILITY): Payer: Medicare Other | Admitting: Internal Medicine

## 2018-10-13 DIAGNOSIS — G062 Extradural and subdural abscess, unspecified: Secondary | ICD-10-CM | POA: Diagnosis not present

## 2018-10-13 DIAGNOSIS — Z710 Person encountering health services to consult on behalf of another person: Secondary | ICD-10-CM

## 2018-10-13 DIAGNOSIS — M4645 Discitis, unspecified, thoracolumbar region: Secondary | ICD-10-CM | POA: Diagnosis not present

## 2018-10-13 NOTE — Progress Notes (Signed)
:  Location:  [facility] Room Number: [phone] Place of Service:  SNF (31)  [name]. Sheppard Coil, MD  Patient Care Team: [name], MD as PCP - General (Family Medicine) Josue Hector, MD as PCP - Cardiology (Cardiology) Hazle Coca, MD (Inactive) as Consulting Physician (Neurosurgery) Tat, Eustace Quail, DO as Consulting Physician (Neurology)  Extended Emergency Contact Information Primary Emergency Contact: Pandora Leiter Address: [place], [place] [address] Johnnette Litter of [place] Phone: [phone] Mobile Phone: [phone] Relation: Spouse Secondary Emergency Contact: Nadeen Landau [place] of [place] Phone: [phone] Mobile Phone: [phone] Relation: Daughter     Allergies: Lyrica [pregabalin]; Zocor [simvastatin]; and Relafen [nabumetone]  Chief Complaint  Patient presents with  . Acute Visit    Family meeting    HPI: Patient is 78 y.o. female whose whose family wished to speak to me about patient's pain medications.  I requested that [facility] will call as she is the patient's nurse and knows the most about the patient's pain and what she responds to what she has not responded to them she is sleepy or not sleepy.  Per family patient has been calling them crying with pain.  Per [facility] patient patient does not appear in pain, and when she does say she is in pain as soon as Apolonio Schneiders tells her she has had her pain medicine she immediately is fine.  Past Medical History:  Diagnosis Date  . Allergic rhinitis   . Arthritis    "all over"  . CHF (congestive heart failure) ([place])   . Chronic back pain   . DJD (degenerative joint disease)   . GERD (gastroesophageal reflux disease)   . Hyperlipidemia   . Hypertension   . Lumbar stenosis   . OSA (obstructive sleep apnea)    "couldn't handle CPAP; use mouth guard some; not all the time" (01/06/2014)  . PAF (paroxysmal atrial  fibrillation) ([place])   . Scoliosis    with radiculopathy L2-S1 with prior surgery  . Small bowel obstruction (HCC)    versus ileus after last bck surgery  . Spondylosis     Past Surgical History:  Procedure Laterality Date  . BACK SURGERY    . CATARACT EXTRACTION W/ INTRAOCULAR LENS  IMPLANT, BILATERAL  2011  . CHOLECYSTECTOMY N/A 07/07/2018   Procedure: LAPAROSCOPIC CHOLECYSTECTOMY;  Surgeon: Erroll Luna, MD;  Location: [facility];  Service: General;  Laterality: N/A;  . COLONOSCOPY  12/09/2004  . LIPOMA EXCISION  1980's   "fatty tumors"  . [place] SURGERY  02/2009   "ruptured disc"  . NASAL SEPTUM SURGERY  80's  . POSTERIOR LUMBAR FUSION  06/2010; 10/2011   "placed screws, rods, spacers both times"  . REPAIR DURAL / CSF LEAK  02/2009  . THORACIC LAMINECTOMY FOR EPIDURAL ABSCESS N/A 09/28/2018   Procedure: THORACIC NINE-THORACIC TEN, THORACIC TEN-THORACIC ELEVEN, THORACIC ELEVEN-THORACIC TWELVE LAMINECTOMIES FOR EPIDURAL ABSCESS;  Surgeon: Judith Part, MD;  Location: [facility];  Service: Neurosurgery;  Laterality: N/A;  . TOTAL ABDOMINAL HYSTERECTOMY  02/1993    Allergies as of 10/13/2018      Reactions   Lyrica [pregabalin] Other (See Comments)   Felt loopy   Zocor [simvastatin] Other (See Comments)   Myalgias   Relafen [nabumetone] Rash      Medication List       Accurate as of Oct 13, 2018  3:29 PM. If you have any  questions, ask your nurse or doctor.        acetaminophen 325 MG tablet Commonly known as:  TYLENOL Take 325 mg by mouth daily as needed for mild pain.   calcium carbonate 500 MG chewable tablet Commonly known as:  TUMS - dosed in mg elemental calcium Chew 1 tablet by mouth 2 (two) times daily as needed for indigestion or heartburn.   carbidopa-levodopa 25-100 MG tablet Commonly known as:  SINEMET IR Take 2 tablets by mouth 3 (three) times daily.   cefTRIAXone  IVPB Commonly known as:  ROCEPHIN Inject 2 g into the vein daily. Indication: Subdural  abscess, empiric treatment  Last Day of Therapy: 11/23/2018 Labs - Once weekly:  CBC/D and BMP, Labs - Every other week:  ESR and CRP   flecainide 50 MG tablet Commonly known as:  TAMBOCOR TAKE 1 TABLET BY MOUTH 2 TIMES DAILY   furosemide 20 MG tablet Commonly known as:  LASIX Take 20 mg by mouth daily.   gabapentin 600 MG tablet Commonly known as:  NEURONTIN Take 600 mg by mouth 2 (two) times daily.   methocarbamol 500 MG tablet Commonly known as:  ROBAXIN Take 500 mg by mouth. take 1/2 tab ( 250 mg) po three times daily for breakfast, lunch and dinner   metoprolol succinate 100 MG 24 hr tablet Commonly known as:  TOPROL-XL TAKE 1 TABLET BY MOUTH DAILY   oxyCODONE 5 MG immediate release tablet Commonly known as:  Oxy IR/ROXICODONE Take 1 tablet (5 mg total) by mouth every 8 (eight) hours. At [place], 2p and 10p   pantoprazole 40 MG tablet Commonly known as:  PROTONIX Take 40 mg by mouth 2 (two) times daily.   polyethylene glycol 17 g packet Commonly known as:  MIRALAX / GLYCOLAX Take 17 g by mouth daily.   potassium chloride SA 20 MEQ tablet Commonly known as:  K-DUR Take 40 mEq by mouth daily.   pramipexole 1 MG tablet Commonly known as:  MIRAPEX Take 1 mg by mouth at bedtime.   pravastatin 40 MG tablet Commonly known as:  PRAVACHOL Take 40 mg by mouth at bedtime.   saccharomyces boulardii 250 MG capsule Commonly known as:  FLORASTOR Take 250 mg by mouth daily.   Travatan Z 0.004 % Soln ophthalmic solution Generic drug:  Travoprost (BAK Free) Place 1 drop into both eyes at bedtime.   Vancomycin HCl in NaCl 750-0.9 MG/150ML-% Soln Inject 750 mg into the vein every 12 (twelve) hours. On Saturdays   warfarin 2.5 MG tablet Commonly known as:  Coumadin Take as directed by the anticoagulation clinic. If you are unsure how to take this medication, talk to your nurse or doctor. Original instructions:  Take 1 tablet (2.5 mg total) by mouth as directed.       No  orders of the defined types were placed in this encounter.   Immunization History  Administered Date(s) Administered  . DTaP 02/24/2005  . H1N1 03/27/2008  . Influenza Split 03/14/2009, 03/20/2010, 04/14/2011, 03/02/2012, 03/26/2013, 02/27/2015, 03/07/2016  . Influenza Whole 03/20/2010  . Influenza, High Dose Seasonal PF 03/01/2014, 02/27/2015, 03/30/2017, 02/08/2018  . Influenza-Unspecified 03/07/2014  . Pneumococcal Conjugate-13 04/17/2013, 07/11/2015  . Pneumococcal Polysaccharide-23 05/12/2009  . Tdap 02/24/2005, 05/21/2011  . Zoster 05/18/2010, 03/22/2017  . Zoster Recombinat (Shingrix) 09/07/2017    Social History   Tobacco Use  . Smoking status: Never Smoker  . Smokeless tobacco: Never Used  Substance Use Topics  . Alcohol use: No  Alcohol/week: 0.0 standard drinks    Comment: 02/01/2014 " glasseof wine once in a blue moon; < once/year"    Family history is   Family History  Problem Relation Age of Onset  . Arthritis Mother   . Heart attack Father   . Hypertension Sister   . Lung cancer Sister   . Asthma Sister   . Allergies Sister       Review of Systems  DATA OBTAINED: from patient, nurse GENERAL:  no fevers, fatigue, appetite changes SKIN: No itching, or rash EYES: No eye pain, redness, discharge EARS: No earache, tinnitus, change in hearing NOSE: No congestion, drainage or bleeding  MOUTH/THROAT: No mouth or tooth pain, No sore throat RESPIRATORY: No cough, wheezing, SOB CARDIAC: No chest pain, palpitations, lower extremity edema  GI: No abdominal pain, No N/V/D or constipation, No heartburn or reflux  GU: No dysuria, frequency or urgency, or incontinence  MUSCULOSKELETAL: No unrelieved bone/joint pain NEUROLOGIC: No headache, dizziness or focal weakness PSYCHIATRIC: No c/o anxiety or sadness   Vitals:   10/13/18 1520  BP: 123/65  Pulse: 60  Temp: (!) 97 F (36.1 C)    SpO2 Readings from Last 1 Encounters:  10/10/18 97%   Body mass  index is 29.25 kg/m.     Physical Exam  GENERAL APPEARANCE: Alert, conversant,  No acute distress.  SKIN: No diaphoresis rash HEAD: Normocephalic, atraumatic  EYES: Conjunctiva/lids clear. Pupils round, reactive. EOMs intact.  EARS: External exam WNL, canals clear. Hearing grossly normal.  NOSE: No deformity or discharge.  MOUTH/THROAT: Lips w/o lesions  RESPIRATORY: Breathing is even, unlabored. Lung sounds are clear   CARDIOVASCULAR: Heart RRR no murmurs, rubs or gallops. No peripheral edema.   GASTROINTESTINAL: Abdomen is soft, non-tender, not distended w/ normal bowel sounds. GENITOURINARY: Bladder non tender, not distended  MUSCULOSKELETAL: No abnormal joints or musculature NEUROLOGIC:  Cranial nerves 2-12 grossly intact. Moves all extremities  PSYCHIATRIC: Mood and affect appropriate to situation, no behavioral issues  Patient Active Problem List   Diagnosis Date Noted  . Acute metabolic encephalopathy 81/85/6314  . Chronic diastolic (congestive) heart failure ([place]) 10/07/2018  . GERD (gastroesophageal reflux disease) 10/07/2018  . 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 Abnormal glucose tolerance test 07/01/2018  . Colon, diverticulosis 07/01/2018  . 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 Glaucoma 07/01/2018  . Chronic kidney disease (CKD), stage III (moderate) ([place]) 07/01/2018  . Neuropathy 07/01/2018  . Pure hypercholesterolemia 07/01/2018  . Restless leg 07/01/2018  . Discitis of thoracolumbar region 07/01/2018  . Acute bronchitis due to infection 05/04/2017  . Cough 04/21/2016  . Preoperative cardiovascular examination 04/18/2015  . Parkinsonism ([place]) 05/15/2014  . Chronic anticoagulation -warfarin therapy 02/03/2014  . Atrial fibrillation with RVR ([place]) 01/31/2014  . Gait instability 11/16/2013  . Encounter for therapeutic drug monitoring 07/20/2013  . Edema 03/03/2010  . ATRIAL FIBRILLATION 04/23/2008  . Hyperlipidemia 01/31/2008  . Obstructive sleep apnea 01/31/2008  . GLAUCOMA  01/30/2008  . Seasonal and perennial allergic rhinitis 01/30/2008  . Osteoarthritis 01/30/2008      Labs reviewed: Basic Metabolic Panel:    Component Value Date/Time   NA 140 10/04/2018 0317   K 3.5 10/04/2018 0317   CL 107 10/04/2018 0317   CO2 20 (L) 10/04/2018 0317   GLUCOSE 106 (H) 10/04/2018 0317   BUN 11 10/04/2018 0317   CREATININE 0.71 10/04/2018 0317   CALCIUM 8.8 (L) 10/04/2018 0317   PROT 5.9 (L) 09/28/2018 0323   ALBUMIN 3.5 09/28/2018 0323   AST 20  09/28/2018 0323   ALT 7 09/28/2018 0323   ALKPHOS 88 09/28/2018 0323   BILITOT 1.1 09/28/2018 0323   GFRNONAA >60 10/04/2018 0317   GFRAA >60 10/04/2018 0317    Recent Labs    07/07/18 0343 07/08/18 0556  07/09/18 0408  10/02/18 0526 10/03/18 0329 10/04/18 0317  NA 139 143   < >  --    < > 139 138 140  K 4.2 3.9   < >  --    < > 3.5 3.2* 3.5  CL 107 107   < >  --    < > 105 105 107  CO2 24 24   < >  --    < > 24 24 20*  GLUCOSE 90 137*   < >  --    < > 127* 98 106*  BUN 24* 14   < >  --    < > _0 CREATININE 0.98 0.89   < >  --    < > 0.68 0.81 0.71  CALCIUM 9.4 9.9   < >  --    < > 8.7* 8.7* 8.8*  MG 1.8 1.8  --  1.9  --   --   --   --   PHOS 3.6 3.6  --  3.0  --   --   --   --    < > = values in this interval not displayed.   Liver Function Tests: Recent Labs    07/08/18 0556 09/27/18 1425 09/28/18 0323  AST _1 ALT _2 ALKPHOS 89 89 88  BILITOT 0.6 1.1 1.1  PROT 7.0 5.8* 5.9*  ALBUMIN 4.0 3.5 3.5   Recent Labs    07/01/18 0057  LIPASE 30   No results for input(s): AMMONIA in the last 8760 hours. CBC: Recent Labs    07/08/18 0556 07/09/18 0408 09/27/18 1425  10/02/18 0526 10/03/18 0329 10/04/18 0317  WBC 8.9 8.3 12.0*   < > 10.3 7.8 9.0  NEUTROABS 8.0* 6.7 10.6*  --   --   --   --   HGB 12.6 12.6 13.0   < > 11.9* 11.6* 12.2  HCT 40.7 41.0 41.5   < > 36.5 35.8* 36.9  MCV 85.1 86.1 86.5   < > 85.3 84.6 84.1  PLT 162 171 171   < > 129* 147* 164   < > = values in  this interval not displayed.   Lipid No results for input(s): CHOL, HDL, LDLCALC, TRIG in the last 8760 hours.  Cardiac Enzymes: No results for input(s): CKTOTAL, CKMB, CKMBINDEX, TROPONINI in the last 8760 hours. BNP: No results for input(s): BNP in the last 8760 hours. No results found for: [facility] Lab Results  Component Value Date   HGBA1C 6.0 (H) 02/27/2013   Lab Results  Component Value Date   TSH 2.350 02/01/2014   Lab Results  Component Value Date   VITAMINB12 439 02/27/2013   Lab Results  Component Value Date   FOLATE  07/11/2010    >20.0 (NOTE)  Reference Ranges        Deficient:       0.4 - 3.3 ng/mL        Indeterminate:   3.4 - 5.4 ng/mL        Normal:              > 5.4 ng/mL   Lab Results  Component Value Date  IRON 26 (L) 07/11/2010   TIBC 214 (L) 07/11/2010   FERRITIN 217 07/11/2010    Imaging and Procedures obtained prior to SNF admission: Dg Thoracolumabar Spine  Result Date: 09/28/2018 CLINICAL DATA:  Localization image. Limited ectomy for epidural abscess. EXAM: THORACOLUMBAR SPINE 1V COMPARISON:  None. FINDINGS: Single lateral intraoperative fluoroscopic images provided. Based on the lumbar spine MRI of 09/27/2018, surgical instruments overlie the posterior elements of the T12 vertebral body. Fluoroscopy provided for 7 seconds. IMPRESSION: Surgical instruments overlying the posterior elements of the T12 vertebral body. Electronically Signed   By: Franki Cabot M.D.   On: 09/28/2018 14:17   Mr Thoracic Spine W Wo Contrast  Result Date: 09/27/2018 CLINICAL DATA:  Low back pain. Lumbar MRI demonstrated epidural abscess extending into the thoracic spine. EXAM: MRI THORACIC WITHOUT AND WITH CONTRAST TECHNIQUE: Multiplanar and multiecho pulse sequences of the thoracic spine were obtained without and with intravenous contrast. CONTRAST:  8 mL Gadovist IV COMPARISON:  Lumbar MRI 09/27/2018. Thoracic and lumbar MRI 07/01/2018 FINDINGS: MRI THORACIC SPINE  FINDINGS Alignment:  Normal Vertebrae: Changes of discitis and osteomyelitis at T12-L1. There is increased fluid within the disc space with considerable endplate destruction which has progressed since 07/01/2018. Irregular enhancement of the endplates postcontrast administration. Pedicle screw fusion bilaterally extending from L1 through S1. No other areas of infection or fracture in the thoracic spine Cord: Large epidural abscess in the spinal canal arising from T12-L1 and extending superiorly. Multilocular fluid collection is present in the ventral epidural space extending cranially to the T9-10 disc space. There is cord compression at T11-12 and T12-L1. Possible gas within the epidural abscess at T10. Paraspinal and other soft tissues: Paraspinous soft tissue thickening at T12-L1 extending into the psoas muscle bilaterally compatible with developing small abscess. Disc levels: Thoracic disc degeneration as below. T7-8: Small right-sided spurring with slight cord flattening. No significant stenosis T8-9: Small central disc protrusion T9-10: Small left-sided disc protrusion T10-11: Large ventral epidural abscess with cord compression T11-12: Large ventral epidural abscess extending to the left of midline with cord compression T12-L1: Discitis and osteomyelitis with endplate destruction and bilateral facet degeneration. Moderate spinal stenosis. IMPRESSION: Findings consistent with discitis and osteomyelitis at T12-L1. Large epidural abscess extends from the T12-L1 disc space to the T9-10 disc space. There is cord compression at T11 and T12 due to the large abscess. Paraspinous soft tissue infection and early psoas abscess bilaterally at T12-L1. These results were called by telephone at the time of interpretation on 09/27/2018 at 6:09 pm to Dr. Vanita Panda , who verbally acknowledged these results. Electronically Signed   By: Franchot Gallo M.D.   On: 09/27/2018 18:10   Dg C-arm 1-60 Min  Result Date: 09/28/2018  CLINICAL DATA:  Localization image. Limited ectomy for epidural abscess. EXAM: THORACOLUMBAR SPINE 1V COMPARISON:  None. FINDINGS: Single lateral intraoperative fluoroscopic images provided. Based on the lumbar spine MRI of 09/27/2018, surgical instruments overlie the posterior elements of the T12 vertebral body. Fluoroscopy provided for 7 seconds. IMPRESSION: Surgical instruments overlying the posterior elements of the T12 vertebral body. Electronically Signed   By: Franki Cabot M.D.   On: 09/28/2018 14:17     Not all labs, radiology exams or other studies done during hospitalization come through on my EPIC note; however they are reviewed by me.    Assessment and Plan  Epidural abscess/thoracolumbar discitis/encounter for family conference without patient presents secondary to COVID-19 precautions- Monnette had spoken to the family earlier in the day and was aware  of their concerns which was helpful to me.  After much talking back-and-forth about pain relief versus sleepiness and the dance that always occurs to try to get pain relief without side effects, we decided on scheduling her oxycodone 5 mg at 6 AM 2 PM and to 10 PM, and reduce her Robaxin to from 500 mg pain medication to 250 mg with pain medication.  The family seemed to understand and were on board with this plan and the conference was considered however.   Time spent greater than 35 minutes;> 50% of time with patient was spent reviewing records, labs, tests and studies, counseling and developing plan of care [name]. Sheppard Coil, MD

## 2018-10-14 ENCOUNTER — Encounter: Payer: Self-pay | Admitting: Internal Medicine

## 2018-10-15 DIAGNOSIS — D649 Anemia, unspecified: Secondary | ICD-10-CM | POA: Diagnosis not present

## 2018-10-15 DIAGNOSIS — I10 Essential (primary) hypertension: Secondary | ICD-10-CM | POA: Diagnosis not present

## 2018-10-16 ENCOUNTER — Other Ambulatory Visit: Payer: Self-pay | Admitting: Internal Medicine

## 2018-10-16 ENCOUNTER — Encounter: Payer: Self-pay | Admitting: Internal Medicine

## 2018-10-16 DIAGNOSIS — I10 Essential (primary) hypertension: Secondary | ICD-10-CM | POA: Diagnosis not present

## 2018-10-16 DIAGNOSIS — D649 Anemia, unspecified: Secondary | ICD-10-CM | POA: Diagnosis not present

## 2018-10-16 DIAGNOSIS — G062 Extradural and subdural abscess, unspecified: Secondary | ICD-10-CM | POA: Diagnosis not present

## 2018-10-16 MED ORDER — TRAMADOL HCL 50 MG PO TABS: 100.0000 mg | ORAL_TABLET | Freq: Three times a day (TID) | ORAL | 0 refills | Status: DC

## 2018-10-17 ENCOUNTER — Emergency Department (HOSPITAL_COMMUNITY)
Admission: EM | Admit: 2018-10-17 | Discharge: 2018-10-18 | Disposition: A | Discharge status: Home / Self Care | Payer: Medicare Other | Attending: Emergency Medicine | Admitting: Emergency Medicine

## 2018-10-17 ENCOUNTER — Encounter: Payer: Self-pay | Admitting: Internal Medicine

## 2018-10-17 ENCOUNTER — Emergency Department (HOSPITAL_COMMUNITY): Payer: Medicare Other

## 2018-10-17 ENCOUNTER — Encounter (HOSPITAL_COMMUNITY): Payer: Self-pay

## 2018-10-17 ENCOUNTER — Non-Acute Institutional Stay (SKILLED_NURSING_FACILITY): Payer: Medicare Other | Admitting: Internal Medicine

## 2018-10-17 DIAGNOSIS — G20 Parkinson's disease: Secondary | ICD-10-CM | POA: Insufficient documentation

## 2018-10-17 DIAGNOSIS — R0902 Hypoxemia: Secondary | ICD-10-CM | POA: Diagnosis not present

## 2018-10-17 DIAGNOSIS — G062 Extradural and subdural abscess, unspecified: Secondary | ICD-10-CM

## 2018-10-17 DIAGNOSIS — G061 Intraspinal abscess and granuloma: Secondary | ICD-10-CM | POA: Diagnosis not present

## 2018-10-17 DIAGNOSIS — R2681 Unsteadiness on feet: Secondary | ICD-10-CM | POA: Diagnosis not present

## 2018-10-17 DIAGNOSIS — I5032 Chronic diastolic (congestive) heart failure: Secondary | ICD-10-CM | POA: Diagnosis not present

## 2018-10-17 DIAGNOSIS — M549 Dorsalgia, unspecified: Secondary | ICD-10-CM | POA: Diagnosis not present

## 2018-10-17 DIAGNOSIS — M4646 Discitis, unspecified, lumbar region: Secondary | ICD-10-CM | POA: Diagnosis not present

## 2018-10-17 DIAGNOSIS — Y92129 Unspecified place in nursing home as the place of occurrence of the external cause: Secondary | ICD-10-CM | POA: Insufficient documentation

## 2018-10-17 DIAGNOSIS — W1830XA Fall on same level, unspecified, initial encounter: Secondary | ICD-10-CM | POA: Insufficient documentation

## 2018-10-17 DIAGNOSIS — Y999 Unspecified external cause status: Secondary | ICD-10-CM | POA: Insufficient documentation

## 2018-10-17 DIAGNOSIS — Y929 Unspecified place or not applicable: Secondary | ICD-10-CM | POA: Diagnosis not present

## 2018-10-17 DIAGNOSIS — Z79899 Other long term (current) drug therapy: Secondary | ICD-10-CM | POA: Insufficient documentation

## 2018-10-17 DIAGNOSIS — R5383 Other fatigue: Secondary | ICD-10-CM | POA: Diagnosis present

## 2018-10-17 DIAGNOSIS — S79911A Unspecified injury of right hip, initial encounter: Secondary | ICD-10-CM | POA: Diagnosis not present

## 2018-10-17 DIAGNOSIS — Z710 Person encountering health services to consult on behalf of another person: Secondary | ICD-10-CM

## 2018-10-17 DIAGNOSIS — Y939 Activity, unspecified: Secondary | ICD-10-CM | POA: Insufficient documentation

## 2018-10-17 DIAGNOSIS — S99911A Unspecified injury of right ankle, initial encounter: Secondary | ICD-10-CM | POA: Diagnosis not present

## 2018-10-17 DIAGNOSIS — M4620 Osteomyelitis of vertebra, site unspecified: Secondary | ICD-10-CM

## 2018-10-17 DIAGNOSIS — M4625 Osteomyelitis of vertebra, thoracolumbar region: Secondary | ICD-10-CM | POA: Insufficient documentation

## 2018-10-17 DIAGNOSIS — Z7901 Long term (current) use of anticoagulants: Secondary | ICD-10-CM | POA: Insufficient documentation

## 2018-10-17 DIAGNOSIS — W19XXXA Unspecified fall, initial encounter: Secondary | ICD-10-CM

## 2018-10-17 DIAGNOSIS — I959 Hypotension, unspecified: Secondary | ICD-10-CM | POA: Diagnosis not present

## 2018-10-17 DIAGNOSIS — I10 Essential (primary) hypertension: Secondary | ICD-10-CM | POA: Insufficient documentation

## 2018-10-17 DIAGNOSIS — M48061 Spinal stenosis, lumbar region without neurogenic claudication: Secondary | ICD-10-CM | POA: Diagnosis not present

## 2018-10-17 DIAGNOSIS — I480 Paroxysmal atrial fibrillation: Secondary | ICD-10-CM | POA: Insufficient documentation

## 2018-10-17 DIAGNOSIS — R531 Weakness: Secondary | ICD-10-CM | POA: Diagnosis not present

## 2018-10-17 DIAGNOSIS — Z981 Arthrodesis status: Secondary | ICD-10-CM | POA: Diagnosis not present

## 2018-10-17 DIAGNOSIS — R5381 Other malaise: Secondary | ICD-10-CM

## 2018-10-17 DIAGNOSIS — Z209 Contact with and (suspected) exposure to unspecified communicable disease: Secondary | ICD-10-CM | POA: Diagnosis not present

## 2018-10-17 DIAGNOSIS — R52 Pain, unspecified: Secondary | ICD-10-CM | POA: Diagnosis not present

## 2018-10-17 LAB — COMPREHENSIVE METABOLIC PANEL
ALT: 6 U/L (ref 0–44)
AST: 14 U/L — ABNORMAL LOW (ref 15–41)
Albumin: 3.1 g/dL — ABNORMAL LOW (ref 3.5–5.0)
Alkaline Phosphatase: 102 U/L (ref 38–126)
Anion gap: 9 (ref 5–15)
BUN: 16 mg/dL (ref 8–23)
CO2: 26 mmol/L (ref 22–32)
Calcium: 9 mg/dL (ref 8.9–10.3)
Chloride: 102 mmol/L (ref 98–111)
Creatinine, Ser: 1.08 mg/dL — ABNORMAL HIGH (ref 0.44–1.00)
GFR calc Af Amer: 58 mL/min — ABNORMAL LOW (ref >60)
GFR calc non Af Amer: 50 mL/min — ABNORMAL LOW (ref >60)
Glucose, Bld: 116 mg/dL — ABNORMAL HIGH (ref 70–99)
Potassium: 4 mmol/L (ref 3.5–5.1)
Sodium: 137 mmol/L (ref 135–145)
Total Bilirubin: 0.7 mg/dL (ref 0.3–1.2)
Total Protein: 6.1 g/dL — ABNORMAL LOW (ref 6.5–8.1)

## 2018-10-17 LAB — CBC WITH DIFFERENTIAL/PLATELET
Abs Immature Granulocytes: 0.05 10*3/uL (ref 0.00–0.07)
Basophils Absolute: 0 10*3/uL (ref 0.0–0.1)
Basophils Relative: 0 %
Eosinophils Absolute: 0.2 10*3/uL (ref 0.0–0.5)
Eosinophils Relative: 3 %
HCT: 37.2 % (ref 36.0–46.0)
Hemoglobin: 11.7 g/dL — ABNORMAL LOW (ref 12.0–15.0)
Immature Granulocytes: 1 %
Lymphocytes Relative: 7 %
Lymphs Abs: 0.6 10*3/uL — ABNORMAL LOW (ref 0.7–4.0)
MCH: 27.3 pg (ref 26.0–34.0)
MCHC: 31.5 g/dL (ref 30.0–36.0)
MCV: 86.9 fL (ref 80.0–100.0)
Monocytes Absolute: 0.8 10*3/uL (ref 0.1–1.0)
Monocytes Relative: 10 %
Neutro Abs: 7 10*3/uL (ref 1.7–7.7)
Neutrophils Relative %: 79 %
Platelets: 202 10*3/uL (ref 150–400)
RBC: 4.28 MIL/uL (ref 3.87–5.11)
RDW: 15.1 % (ref 11.5–15.5)
WBC: 8.7 10*3/uL (ref 4.0–10.5)
nRBC: 0 % (ref 0.0–0.2)

## 2018-10-17 LAB — LACTIC ACID, PLASMA: Lactic Acid, Venous: 1.4 mmol/L (ref 0.5–1.9)

## 2018-10-17 MED ORDER — SODIUM CHLORIDE 0.9 % IV BOLUS
1000.0000 mL | Freq: Once | INTRAVENOUS | Status: AC
  Administered 2018-10-17: 1000 mL via INTRAVENOUS

## 2018-10-17 MED ORDER — GADOBUTROL 1 MMOL/ML IV SOLN
8.0000 mL | Freq: Once | INTRAVENOUS | Status: AC | PRN
  Administered 2018-10-17: 17:00:00 8 mL via INTRAVENOUS

## 2018-10-17 NOTE — ED Notes (Signed)
CALLED PTAR--[name]  

## 2018-10-17 NOTE — ED Provider Notes (Signed)
[facility] EMERGENCY DEPARTMENT Provider Note   CSN: [id number] Arrival date & time: 10/17/18  1423    History   Chief Complaint Chief Complaint  Patient presents with  . Fatigue    HPI [name] is a 78 y.o. female.     HPI Patient presents from nursing facility with concern of anorexia, fatigue, weakness and ongoing pain in her back. Patient transferred to that facility about 2 weeks ago from our hospital after hospitalization for discitis, osteitis. Patient illnes began several months ago, with initial consideration of back pain, which eventually was diagnosed as epidural abscess. She is currently receiving PICC line antibiotic therapy at her nursing facility She is awake and alert, states that she feels weak, anorexic, with ongoing pain in her back, as well as legs, but denies focal new weakness anywhere. She also seemingly denies any fever. No report of vomiting, diarrhea, falls.  At the family's request given her worsening weakness she was brought here for evaluation.  Participated in the patient's care during her most recent emergency department visit last month, during which she was found to have MRI evidence of epidural abscess.   The patient has also had a changes in her pain medication regimen, provided for lower extremity pain.  Past Medical History:  Diagnosis Date  . Allergic rhinitis   . Arthritis    "all over"  . CHF (congestive heart failure) ([place])   . Chronic back pain   . DJD (degenerative joint disease)   . GERD (gastroesophageal reflux disease)   . Hyperlipidemia   . Hypertension   . Lumbar stenosis   . OSA (obstructive sleep apnea)    "couldn't handle CPAP; use mouth guard some; not all the time" (01/06/2014)  . PAF (paroxysmal atrial fibrillation) ([place])   . Scoliosis    with radiculopathy L2-S1 with prior surgery  . Small bowel obstruction ([facility])    versus ileus after last bck surgery  . Spondylosis     Patient  Active Problem List   Diagnosis Date Noted  . Acute metabolic encephalopathy 42/35/3614  . Chronic diastolic (congestive) heart failure ([place]) 10/07/2018  . GERD (gastroesophageal reflux disease) 10/07/2018  . 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 Abnormal glucose tolerance test 07/01/2018  . Colon, diverticulosis 07/01/2018  . 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 Glaucoma 07/01/2018  . Chronic kidney disease (CKD), stage III (moderate) ([place]) 07/01/2018  . Neuropathy 07/01/2018  . Pure hypercholesterolemia 07/01/2018  . Restless leg 07/01/2018  . Discitis of thoracolumbar region 07/01/2018  . Acute bronchitis due to infection 05/04/2017  . Cough 04/21/2016  . Preoperative cardiovascular examination 04/18/2015  . Parkinsonism ([place]) 05/15/2014  . Chronic anticoagulation -warfarin therapy 02/03/2014  . Atrial fibrillation with RVR ([place]) 01/31/2014  . Gait instability 11/16/2013  . Encounter for therapeutic drug monitoring 07/20/2013  . Edema 03/03/2010  . ATRIAL FIBRILLATION 04/23/2008  . Hyperlipidemia 01/31/2008  . Obstructive sleep apnea 01/31/2008  . GLAUCOMA 01/30/2008  . Seasonal and perennial allergic rhinitis 01/30/2008  . Osteoarthritis 01/30/2008    Past Surgical History:  Procedure Laterality Date  . BACK SURGERY    . CATARACT EXTRACTION W/ INTRAOCULAR LENS  IMPLANT, BILATERAL  2011  . CHOLECYSTECTOMY N/A 07/07/2018   Procedure: LAPAROSCOPIC CHOLECYSTECTOMY;  Surgeon: Erroll Luna, MD;  Location: [facility];  Service: General;  Laterality: N/A;  . COLONOSCOPY  12/09/2004  . LIPOMA EXCISION  1980's   "fatty tumors"  . [place] SURGERY  02/2009   "ruptured disc"  . NASAL SEPTUM  SURGERY  80's  . POSTERIOR LUMBAR FUSION  06/2010; 10/2011   "placed screws, rods, spacers both times"  . REPAIR DURAL / CSF LEAK  02/2009  . THORACIC LAMINECTOMY FOR EPIDURAL ABSCESS N/A 09/28/2018   Procedure: THORACIC NINE-THORACIC TEN, THORACIC TEN-THORACIC ELEVEN, THORACIC ELEVEN-THORACIC TWELVE LAMINECTOMIES FOR  EPIDURAL ABSCESS;  Surgeon: Judith Part, MD;  Location: [facility];  Service: Neurosurgery;  Laterality: N/A;  . TOTAL ABDOMINAL HYSTERECTOMY  02/1993     OB History   No obstetric history on file.      Home Medications    Prior to Admission medications   Medication Sig Start Date End Date Taking? Authorizing Provider  acetaminophen (TYLENOL) 325 MG tablet Take 325 mg by mouth daily as needed for mild pain.     [provider]  calcium carbonate (TUMS - DOSED IN MG ELEMENTAL CALCIUM) 500 MG chewable tablet Chew 1 tablet by mouth 2 (two) times daily as needed for indigestion or heartburn.     [provider]  carbidopa-levodopa (SINEMET IR) 25-100 MG tablet Take 2 tablets by mouth 3 (three) times daily. 07/09/18   Raiford Noble Latif, DO  cefTRIAXone (ROCEPHIN) IVPB Inject 2 g into the vein daily. Indication: Subdural abscess, empiric treatment  Last Day of Therapy: 11/23/2018 Labs - Once weekly:  CBC/D and BMP, Labs - Every other week:  ESR and CRP 10/04/18 11/24/18  Caren Griffins, MD  flecainide (TAMBOCOR) 50 MG tablet TAKE 1 TABLET BY MOUTH 2 TIMES DAILY 02/27/18   Josue Hector, MD  furosemide (LASIX) 20 MG tablet Take 20 mg by mouth daily.     [provider]  gabapentin (NEURONTIN) 600 MG tablet Take 600 mg by mouth 2 (two) times daily.    [provider]  metoprolol succinate (TOPROL-XL) 100 MG 24 hr tablet TAKE 1 TABLET BY MOUTH DAILY 01/30/18   Josue Hector, MD  pantoprazole (PROTONIX) 40 MG tablet Take 40 mg by mouth 2 (two) times daily.     [provider]  polyethylene glycol (MIRALAX / GLYCOLAX) packet Take 17 g by mouth daily. 07/10/18   Raiford Noble Latif, DO  potassium chloride SA (K-DUR,KLOR-CON) 20 MEQ tablet Take 40 mEq by mouth daily.     [provider]  pramipexole (MIRAPEX) 1 MG tablet Take 1 mg by mouth at bedtime.    [provider]  pravastatin (PRAVACHOL) 40 MG tablet Take 40 mg by mouth at  bedtime.     [provider]  saccharomyces boulardii (FLORASTOR) 250 MG capsule Take 250 mg by mouth daily.    [provider]  traMADol (ULTRAM) 50 MG tablet Take 2 tablets (100 mg total) by mouth 3 (three) times daily. At [facility], 2p, and 10p 10/16/18   Hennie Duos, MD  TRAVATAN Z 0.004 % SOLN ophthalmic solution Place 1 drop into both eyes at bedtime.  01/21/15   [provider]  Vancomycin HCl in NaCl 750-0.9 MG/150ML-% SOLN Inject 750 mg into the vein every 12 (twelve) hours. On Saturdays 10/07/18 11/23/18  [provider]  warfarin (COUMADIN) 2.5 MG tablet Take 1 tablet (2.5 mg total) by mouth as directed. 06/14/18 06/14/19  Josue Hector, MD    Family History Family History  Problem Relation Age of Onset  . Arthritis Mother   . Heart attack Father   . Hypertension Sister   . Lung cancer Sister   . Asthma Sister   . Allergies Sister     Social  History Social History   Tobacco Use  . Smoking status: Never Smoker  . Smokeless tobacco: Never Used  Substance Use Topics  . Alcohol use: No    Alcohol/week: 0.0 standard drinks    Comment: 02/01/2014 " glasseof wine once in a blue moon; < once/year"  . Drug use: No     Allergies   Lyrica [pregabalin]; Zocor [simvastatin]; and Relafen [nabumetone]   Review of Systems Review of Systems  Constitutional:       Per HPI, otherwise negative  HENT:       Per HPI, otherwise negative  Respiratory:       Per HPI, otherwise negative  Cardiovascular:       Per HPI, otherwise negative  Gastrointestinal: Negative for vomiting.  Endocrine:       Negative aside from HPI  Genitourinary:       Neg aside from HPI   Musculoskeletal:       Per HPI, otherwise negative  Skin: Negative.   Neurological: Positive for weakness. Negative for syncope.     Physical Exam Updated Vital Signs BP 131/60 (BP Location: Left Arm)   Pulse 84   Temp 99.5 F (37.5 C) (Oral)   Resp 18   SpO2 100%   Physical Exam  Vitals signs and nursing note reviewed.  Constitutional:      General: She is not in acute distress.    Appearance: She is well-developed.  HENT:     Head: Normocephalic and atraumatic.  Eyes:     Conjunctiva/sclera: Conjunctivae normal.  Cardiovascular:     Rate and Rhythm: Normal rate and regular rhythm.  Pulmonary:     Effort: Pulmonary effort is normal. No respiratory distress.     Breath sounds: Normal breath sounds. No stridor.  Abdominal:     General: There is no distension.  Skin:    General: Skin is warm and dry.     Comments: PICC line unremarkable right arm  Neurological:     Mental Status: She is alert and oriented to person, place, and time.     Cranial Nerves: No cranial nerve deficit.     Motor: Tremor and atrophy present.     Coordination: Coordination abnormal.     Comments: Patient is slow to move all extremities, and slow of speech, but does move everything spontaneously, speaks clearly, though briefly.      ED Treatments / Results  Labs (all labs ordered are listed, but only abnormal results are displayed) Labs Reviewed  COMPREHENSIVE METABOLIC PANEL  CBC WITH DIFFERENTIAL/PLATELET  URINALYSIS, ROUTINE W REFLEX MICROSCOPIC  LACTIC ACID, PLASMA  LACTIC ACID, PLASMA    EKG None  Radiology No results found.  Procedures Procedures (including critical care time)  Medications Ordered in ED Medications - No data to display   Initial Impression / Assessment and Plan / ED Course  I have reviewed the triage vital signs and the nursing notes.  Pertinent labs & imaging results that were available during my care of the patient were reviewed by me and considered in my medical decision making (see chart for details).    Reviewed the patient's chart after our initial patient had multiple studies performed of her epidural abscesses, T12/L1, including aspiration.    Elderly female presents after recent evaluation here, now from nursing facility with  concern for anorexia, weakness, ongoing pain. Patient's recent history is notable for osteitis, discitis of the mid back, and with concern for progression of disease, versus other acute, the patient  has MRI planned, as well as labs.  With deconditioned state, weakness, anorexia, patient may require admission, but will require repeat evaluation, Dr. Wilson Singer is aware of the patient's presentation.  Final Clinical Impressions(s) / ED Diagnoses  Weakness   Carmin Muskrat, MD 10/17/18 1559

## 2018-10-17 NOTE — ED Notes (Signed)
Pt going to mri 

## 2018-10-17 NOTE — ED Notes (Signed)
Pt back from MRI 

## 2018-10-17 NOTE — ED Notes (Signed)
This RN acting as Art therapist and took call from pt's dtr, Maudie Mercury. Pt's dtr asking how her mother is doing. At this point we are awaiting MRI results. Pt's dtr does not want her mother started on oxycodone as this medication has caused her to become to sedated in the past. Pt's dtr also mentioned that when pt fell last night, she complained of right ankle and right hip pain. Will notify pt's nurse. I will communicate with pt's dtr once we have more information on the pt.

## 2018-10-17 NOTE — ED Notes (Signed)
Dr. Wilson Singer aware of daughter calling and wanting [place] to evaluate right hip and right ankle , per md will come and evaluate patient

## 2018-10-17 NOTE — Progress Notes (Signed)
Location:  [facility] Room Number: 161-W Place of Service:  SNF ([phone])  [name], MD  Patient Care Team: [name], MD as PCP - General (Family Medicine) Josue Hector, MD as PCP - Cardiology (Cardiology) Hazle Coca, MD (Inactive) as Consulting Physician (Neurosurgery) Tat, Eustace Quail, DO as Consulting Physician (Neurology)  Extended Emergency Contact Information Primary Emergency Contact: Pandora Leiter Address: [address] [place], [place] [address] Johnnette Litter of [place] Phone: [phone] Mobile Phone: [phone] Relation: Spouse Secondary Emergency Contact: Nadeen Landau [place] Phone: [phone] Mobile Phone: [phone] Relation: Daughter    Allergies: Lyrica [pregabalin]; Zocor [simvastatin]; and Relafen [nabumetone]  Chief Complaint  Patient presents with   Acute Visit    HPI: Patient is 78 y.o. female who is being seen to assess whether patient needs to go to the hospital.  Family stated that they would like patient to go to the hospital because she is "not progressing as well as she should".  On Monday patient was quite sedated on a pain medication regimen that had been okayed by the family the prior Friday and was unable to participate in rehab but today is the first day that any of staff has been told that patient is a pain clinic patient. I am assessing patient as to mental status and ability to work with physical therapy to see if she is appropriate to be sent to the emergency department.  Past Medical History:  Diagnosis Date   Allergic rhinitis    Arthritis    "all over"   CHF (congestive heart failure) (HCC)    Chronic back pain    DJD (degenerative joint disease)    GERD (gastroesophageal reflux disease)    Hyperlipidemia    Hypertension    Lumbar stenosis    OSA (obstructive sleep apnea)    "couldn't handle CPAP; use mouth guard some; not  all the time" (01/06/2014)   PAF (paroxysmal atrial fibrillation) ([facility])    Scoliosis    with radiculopathy L2-S1 with prior surgery   Small bowel obstruction ([facility])    versus ileus after last bck surgery   Spondylosis     Past Surgical History:  Procedure Laterality Date   BACK SURGERY     CATARACT EXTRACTION W/ INTRAOCULAR LENS  IMPLANT, BILATERAL  2011   CHOLECYSTECTOMY N/A 07/07/2018   Procedure: LAPAROSCOPIC CHOLECYSTECTOMY;  Surgeon: Erroll Luna, MD;  Location: [facility];  Service: General;  Laterality: N/A;   COLONOSCOPY  12/09/2004   LIPOMA EXCISION  1980's   "fatty tumors"   [place]  02/2009   "ruptured disc"   NASAL SEPTUM SURGERY  80's   POSTERIOR LUMBAR FUSION  06/2010; 10/2011   "placed screws, rods, spacers both times"   REPAIR DURAL / CSF LEAK  02/2009   THORACIC LAMINECTOMY FOR EPIDURAL ABSCESS N/A 09/28/2018   Procedure: THORACIC NINE-THORACIC TEN, THORACIC TEN-THORACIC ELEVEN, THORACIC ELEVEN-THORACIC TWELVE LAMINECTOMIES FOR EPIDURAL ABSCESS;  Surgeon: Judith Part, MD;  Location: [facility];  Service: Neurosurgery;  Laterality: N/A;   TOTAL ABDOMINAL HYSTERECTOMY  02/1993    Allergies as of 10/17/2018      Reactions   Lyrica [pregabalin] Other (See Comments)   Felt loopy   Zocor [simvastatin] Other (See Comments)   Myalgias   Relafen [nabumetone] Rash      Medication List       Accurate as of Oct 17, 2018 11:59 PM. If you have any questions, ask your nurse or doctor.        STOP taking these medications   Vancomycin HCl in NaCl 750-0.9 MG/150ML-% Soln Stopped by:  Inocencio Homes, MD     TAKE these medications   acetaminophen 325 MG tablet Commonly known as:  TYLENOL Take 325 mg by mouth daily as needed for mild pain.   calcium carbonate 500 MG chewable tablet Commonly known as:  TUMS - dosed in mg elemental calcium Chew 1 tablet by mouth 2 (two) times daily as needed for indigestion or heartburn.   carbidopa-levodopa 25-100  MG tablet Commonly known as:  SINEMET IR Take 2 tablets by mouth 3 (three) times daily.   cefTRIAXone  IVPB Commonly known as:  ROCEPHIN Inject 2 g into the vein daily. Indication: Subdural abscess, empiric treatment  Last Day of Therapy: 11/23/2018 Labs - Once weekly:  CBC/D and BMP, Labs - Every other week:  ESR and CRP   flecainide 50 MG tablet Commonly known as:  TAMBOCOR TAKE 1 TABLET BY MOUTH 2 TIMES DAILY   furosemide 20 MG tablet Commonly known as:  LASIX Take 20 mg by mouth daily.   gabapentin 600 MG tablet Commonly known as:  NEURONTIN Take 600 mg by mouth 2 (two) times daily.   methocarbamol 500 MG tablet Commonly known as:  ROBAXIN Take 250 mg by mouth 2 (two) times a day. 0.5 tablet to = 250 mg for muscle spasms   metoprolol succinate 100 MG 24 hr tablet Commonly known as:  TOPROL-XL TAKE 1 TABLET BY MOUTH DAILY   pantoprazole 40 MG tablet Commonly known as:  PROTONIX Take 40 mg by mouth 2 (two) times daily.   polyethylene glycol 17 g packet Commonly known as:  MIRALAX / GLYCOLAX Take 17 g by mouth daily.   potassium chloride SA 20 MEQ tablet Commonly known as:  K-DUR Take 40 mEq by mouth daily.   pramipexole 1 MG tablet Commonly known as:  MIRAPEX Take 1 mg by mouth at bedtime.   pravastatin 40 MG tablet Commonly known as:  PRAVACHOL Take 40 mg by mouth at bedtime.   saccharomyces boulardii 250 MG capsule Commonly known as:  FLORASTOR Take 250 mg by mouth daily.   traMADol 50 MG tablet Commonly known as:  ULTRAM Take 2 tablets (100 mg total) by mouth 3 (three) times daily. At 6a, 2p, and 10p   Travatan Z 0.004 % Soln ophthalmic solution Generic drug:  Travoprost (BAK Free) Place 1 drop into both eyes at bedtime.   warfarin 2 MG tablet Commonly known as:  COUMADIN Take as directed by the anticoagulation clinic. If you are unsure how to take this medication, talk to your nurse or doctor. Original instructions:  Take 2 mg by mouth  daily. What changed:  Another medication with the same name was removed. Continue taking this medication, and follow the directions you see here. Changed by:  Inocencio Homes, MD       No orders of the defined types were placed in this encounter.   Immunization History  Administered Date(s) Administered   DTaP 02/24/2005   H1N1 03/27/2008   Influenza Split 03/14/2009, 03/20/2010, 04/14/2011, 03/02/2012, 03/26/2013, 02/27/2015, 03/07/2016   Influenza Whole 03/20/2010   Influenza, High Dose Seasonal PF 03/01/2014, 02/27/2015, 03/30/2017, 02/08/2018   Influenza-Unspecified 03/07/2014   Pneumococcal Conjugate-13 04/17/2013, 07/11/2015   Pneumococcal Polysaccharide-23 05/12/2009   Tdap 02/24/2005, 05/21/2011   Zoster 05/18/2010, 03/22/2017   Zoster Recombinat (Shingrix)  09/07/2017    Social History   Tobacco Use   Smoking status: Never Smoker   Smokeless tobacco: Never Used  Substance Use Topics   Alcohol use: No    Alcohol/week: 0.0 standard drinks    Comment: 02/01/2014 " glasseof wine once in a blue moon; < once/year"    Review of Systems  DATA OBTAINED: from patient, nurse GENERAL:  no fevers, fatigue, appetite changes SKIN: No itching, rash HEENT: No complaint RESPIRATORY: No cough, wheezing, SOB CARDIAC: No chest pain, palpitations, lower extremity edema  GI: No abdominal pain, No N/V/D or constipation, No heartburn or reflux  GU: No dysuria, frequency or urgency, or incontinence  MUSCULOSKELETAL: Complains of pain in back only when I ask otherwise patient does not complain of pain NEUROLOGIC: No headache, dizziness  PSYCHIATRIC: No overt anxiety or sadness  Vitals:   10/17/18 1630  BP: 138/70  Pulse: 74  Resp: 18  Temp: 98 F (36.7 C)  SpO2: 97%   Body mass index is 28.69 kg/m. Physical Exam  GENERAL APPEARANCE: Alert, conversant, No acute distress  SKIN: No diaphoresis rash HEENT: Unremarkable RESPIRATORY: Breathing is even, unlabored.  Lung sounds are clear   CARDIOVASCULAR: Heart RRR no murmurs, rubs or gallops. No peripheral edema  GASTROINTESTINAL: Abdomen is soft, non-tender, not distended w/ normal bowel sounds.  GENITOURINARY: Bladder non tender, not distended  MUSCULOSKELETAL: No abnormal joints or musculature NEUROLOGIC: Cranial nerves 2-12 grossly intact. 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   Glaucoma 07/01/2018   Chronic kidney disease (CKD), stage III (moderate) ([facility]) 07/01/2018   Neuropathy 07/01/2018   Pure hypercholesterolemia 07/01/2018   Restless leg 07/01/2018   Discitis of thoracolumbar region 07/01/2018   Acute bronchitis due to infection 05/04/2017   Cough 04/21/2016   Preoperative cardiovascular examination 04/18/2015   Parkinsonism ([facility]) 05/15/2014   Chronic anticoagulation -warfarin therapy 02/03/2014   Atrial fibrillation with RVR ([facility]) 01/31/2014   Gait instability 11/16/2013   Encounter for therapeutic drug monitoring 07/20/2013   Edema 03/03/2010   ATRIAL FIBRILLATION 04/23/2008   Hyperlipidemia 01/31/2008   Obstructive sleep apnea 01/31/2008   GLAUCOMA 01/30/2008   Seasonal and perennial allergic rhinitis 01/30/2008   Osteoarthritis 01/30/2008    CMP     Component Value Date/Time   NA 137 10/17/2018 1526   K 4.0 10/17/2018 1526   CL 102 10/17/2018 1526   CO2 26 10/17/2018 1526   GLUCOSE 116 (H) 10/17/2018 1526   BUN 16 10/17/2018 1526   CREATININE 1.08 (H) 10/17/2018 1526   CALCIUM 9.0 10/17/2018 1526   PROT 6.1 (L) 10/17/2018  1526   ALBUMIN 3.1 (L) 10/17/2018 1526   AST 14 (L) 10/17/2018 1526   ALT 6 10/17/2018 1526   ALKPHOS 102 10/17/2018 1526   BILITOT 0.7 10/17/2018 1526   GFRNONAA 50 (L) 10/17/2018 1526   GFRAA 58 (L) 10/17/2018 1526   Recent Labs    07/07/18 0343 07/08/18 0556  07/09/18 0408  10/03/18 0329 10/04/18 0317 10/17/18 1526  NA 139 143   < >  --    < > 138 140 137  K 4.2 3.9   < >  --    < >  3.2* 3.5 4.0  CL 107 107   < >  --    < > 105 107 102  CO2 24 24   < >  --    < > 24 20* 26  GLUCOSE 90 137*   < >  --    < > 98 106* 116*  BUN 24* 14   < >  --    < > _0 CREATININE 0.98 0.89   < >  --    < > 0.81 0.71 1.08*  CALCIUM 9.4 9.9   < >  --    < > 8.7* 8.8* 9.0  MG 1.8 1.8  --  1.9  --   --   --   --   PHOS 3.6 3.6  --  3.0  --   --   --   --    < > = values in this interval not displayed.   Recent Labs    09/27/18 1425 09/28/18 0323 10/17/18 1526  AST 23 20 14*  ALT _1 ALKPHOS 89 88 102  BILITOT 1.1 1.1 0.7  PROT 5.8* 5.9* 6.1*  ALBUMIN 3.5 3.5 3.1*   Recent Labs    07/09/18 0408 09/27/18 1425  10/03/18 0329 10/04/18 0317 10/17/18 1526  WBC 8.3 12.0*   < > 7.8 9.0 8.7  NEUTROABS 6.7 10.6*  --   --   --  7.0  HGB 12.6 13.0   < > 11.6* 12.2 11.7*  HCT 41.0 41.5   < > 35.8* 36.9 37.2  MCV 86.1 86.5   < > 84.6 84.1 86.9  PLT 171 171   < > 147* 164 202   < > = values in this interval not displayed.   No results for input(s): CHOL, LDLCALC, TRIG in the last 8760 hours.  Invalid input(s): HCL No results found for: [facility] Lab Results  Component Value Date   TSH 2.350 02/01/2014   Lab Results  Component Value Date   HGBA1C 6.0 (H) 02/27/2013   Lab Results  Component Value Date   CHOL  12/22/2006    183        ATP III CLASSIFICATION:  <200     mg/dL   Desirable  200-239  mg/dL   Borderline High  >=240    mg/dL   High   HDL 47 12/22/2006   LDLCALC (H) 12/22/2006    101        Total Cholesterol/HDL:CHD Risk Coronary Heart Disease Risk  Table                     Men   Women  1/2 Average Risk   3.4   3.3   TRIG 174 (H) 12/22/2006   CHOLHDL 3.9 12/22/2006    Significant Diagnostic Results in last 30 days:  Dg Thoracolumabar Spine  Result Date: 09/28/2018 CLINICAL DATA:  Localization image. Limited ectomy for epidural abscess. EXAM: THORACOLUMBAR SPINE 1V COMPARISON:  None. FINDINGS: Single lateral intraoperative fluoroscopic images provided. Based on the lumbar spine MRI of 09/27/2018, surgical instruments overlie the posterior elements of the T12 vertebral body. Fluoroscopy provided for 7 seconds. IMPRESSION: Surgical instruments overlying the posterior elements of the T12 vertebral body. Electronically Signed   By: Franki Cabot M.D.   On: 09/28/2018 14:17   Dg Ankle Complete Right  Result Date: 10/17/2018 CLINICAL DATA:  Fall EXAM: RIGHT ANKLE - COMPLETE 3+ VIEW COMPARISON:  None. FINDINGS: There is no  evidence of fracture, dislocation, or joint effusion. There is no evidence of arthropathy or other focal bone abnormality. Soft tissues are unremarkable. IMPRESSION: Negative. Electronically Signed   By: Ulyses Jarred M.D.   On: 10/17/2018 20:43   Mr Thoracic Spine W Wo Contrast  Result Date: 10/17/2018 CLINICAL DATA:  Initial evaluation for persistent and worsening back pain, recently identified epidural abscess. Or arm a are EXAM: MRI THORACIC AND LUMBAR SPINE WITHOUT AND WITH CONTRAST TECHNIQUE: Multiplanar and multiecho pulse sequences of the thoracic and lumbar spine were obtained without and with intravenous contrast. CONTRAST:  80 cc of Gadavist. COMPARISON:  Prior MRI from 09/27/2018. FINDINGS: MRI THORACIC SPINE FINDINGS Alignment: Stable alignment with preservation of the normal thoracic kyphosis. No interval listhesis or subluxation. Vertebrae: Changes compatible with osteomyelitis discitis again seen about the T12-L1 interspace. Associated fluid within the T12-L1 interspace has increased. Persistent adjacent endplate  edema and enhancement with progressive endplate osseous erosion. Mild height loss at the anterior aspect of the L1 superior endplate is similar. Susceptibility artifact from posterior fusion at L1 through the sacrum again noted. There has been interval performance of posterior decompressive laminectomies at T10-T12 for epidural abscess of activation. No other sites of infection within the thoracic spine. Underlying bone marrow signal intensity within normal limits. 8 mm benign hemangioma noted within the T2 vertebral body. No other discrete or worrisome osseous lesions. Cord: Persistent epidural abscess seen tracking superiorly from the T12-L1 interspace to approximately the level of T10. Overall, size is markedly decreased as compared to previous exam, measuring up to 7 mm in AP diameter within the left ventral space at the level of T12. Superimposed T2 hypointense nonenhancing material within the right ventral epidural space at the level of T12 measures 9 x 15 x 25 mm (series 22, image 37), suspected to reflect superimposed extruded disc material. Epidural enhancement seen within the dorsal epidural space extending from T10-11 through T12-L1, which could be related to infection and/or postoperative changes. Small amount of Peri discal enhancement at T8-9 noted within the ventral epidural space related to adjacent small disc protrusion. No other abnormal enhancement. Signal intensity within the underlying thoracic spinal cord grossly within normal limits, with no convincing underlying cord signal abnormality. Paraspinal and other soft tissues: Persistent paraspinous edema and enhancement within the bilateral psoas musculature adjacent to T12-L1. No definite discrete or drainable soft tissue abscess. Postoperative changes from interval laminectomy at T10-T12. Trace layering bilateral pleural effusions. T2 hyperintense simple left renal cyst partially visualized. Disc levels: T7-8: Shallow broad base right central  disc protrusion flattens the right ventral thecal sac. No significant stenosis or cord deformity. Appearance is stable. T8-9: Central disc protrusion indents the ventral thecal sac with mild spinal stenosis. No cord deformity. Associated mild peridiscal enhancement. Appearance is stable. T9-10: Small left-sided disc protrusion without stenosis or cord deformity. Appearance is unchanged. T10-11: Mild epidural phlegmon/abscess, predominantly involving the left ventral epidural space, improved from previous. No associated cord compression now seen at this level. Residual mild spinal stenosis. T11-12: Persistent ventral epidural abscess, greater on the left, but decreased in size from previous. Interval decompressive laminectomy. Thecal sac remains patent with residual mild spinal stenosis, improved from previous. T12-L1: Persistent osteomyelitis discitis with endplate destruction and bilateral facet hypertrophy. Probable superimposed right subarticular disc extrusion with superior migration as above. Epidural phlegmon/abscess within the ventral epidural space as well. Associated severe spinal stenosis, slightly worsened as compared to previous exam. Thecal sac measures 6 mm in AP diameter (series 22, image  40), previously 8 mm in AP diameter on prior study (series 20, image 41 on MRI from 09/27/2018). Severe left with moderate right foraminal stenosis. MRI LUMBAR SPINE FINDINGS Segmentation: Standard. Lowest well-formed disc labeled the L5-S1 level. Alignment: Straightening of the normal lumbar lordosis with underlying mild levoscoliosis. Trace chronic retrolisthesis of L2 on L3. 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 Otherwise vertebral body heights are maintained without evidence for acute or chronic fracture. Susceptibility artifact from prior posterior and interbody fusion at L1 through S1. Underlying bone marrow signal  intensity grossly within normal limits. No discrete or worrisome osseous lesions. No other abnormal marrow edema or enhancement elsewhere within the lumbar spine. Conus medullaris: Extends to the L1 level and appears normal. Paraspinal and other soft tissues: Paraspinous edema adjacent to the T12-L1 interspace related to acute infection. No definite discrete abscess or drainable fluid collection. Chronic postsurgical changes present within the posterior paraspinous soft tissues. Superimposed small chronic postoperative seroma noted at the laminectomy site. Multiple T2 hyperintense renal cysts noted bilaterally, visualized visceral structures otherwise unremarkable. Greater than right. Disc levels: L1-2: Chronic intervertebral disc space narrowing with diffuse disc bulge and disc desiccation. Prior posterior decompression with fusion. No residual stenosis. L2-3: Chronic intervertebral disc space narrowing with disc desiccation. Prior posterior decompression with fusion. No residual stenosis. L3-4: Prior posterior decompression with fusion. No residual stenosis. L4-5: Prior posterior and interbody fusion with posterior decompression. No residual stenosis. L5-S1: Prior posterior and interbody fusion with decompression. No residual stenosis. IMPRESSION: MRI THORACIC SPINE IMPRESSION: 1. 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 Persistent but decreased size of epidural phlegmon/abscess extending from T10 through T12-L1. Overall, previously seen spinal stenosis has improved at the T10-11 and T11-12 levels status post laminectomy, although appears slightly worsened at the level of T12-L1 related to degenerative changes, epidural abscess, and probable superimposed right subarticular disc extrusion. Associated cord compression at T12-L1 without frank cord signal changes. 2. Associated mild paraspinous soft  tissue infection adjacent to the T12-L1 interspace without visible abscess or drainable fluid collection. 3. Additional multifocal disc protrusions as detailed above, otherwise unchanged from prior. MRI LUMBAR SPINE IMPRESSION: 1. No evidence for acute infection elsewhere within the lumbar spine. No other acute abnormality identified. 2. Postoperative changes from prior posterior decompression with fusion at L1 through S1. No significant residual stenosis. Electronically Signed   By: Jeannine Boga M.D.   On: 10/17/2018 18:37   Mr Thoracic Spine W Wo Contrast  Result Date: 09/27/2018 CLINICAL DATA:  Low back pain. Lumbar MRI demonstrated epidural abscess extending into the thoracic spine. EXAM: MRI THORACIC WITHOUT AND WITH CONTRAST TECHNIQUE: Multiplanar and multiecho pulse sequences of the thoracic spine were obtained without and with intravenous contrast. CONTRAST:  8 mL Gadovist IV COMPARISON:  Lumbar MRI 09/27/2018. Thoracic and lumbar MRI 07/01/2018 FINDINGS: MRI THORACIC SPINE FINDINGS Alignment:  Normal Vertebrae: Changes of discitis and osteomyelitis at T12-L1. There is increased fluid within the disc space with considerable endplate destruction which has progressed since 07/01/2018. Irregular enhancement of the endplates postcontrast administration. Pedicle screw fusion bilaterally extending from L1 through S1. No other areas of infection or fracture in the thoracic spine Cord: Large epidural abscess in the spinal canal arising from T12-L1 and extending superiorly. Multilocular fluid collection is present in the ventral epidural space extending cranially to the T9-10 disc space. There is cord compression at T11-12  and T12-L1. Possible gas within the epidural abscess at T10. Paraspinal and other soft tissues: Paraspinous soft tissue thickening at T12-L1 extending into the psoas muscle bilaterally compatible with developing small abscess. Disc levels: Thoracic disc degeneration as below. T7-8:  Small right-sided spurring with slight cord flattening. No significant stenosis T8-9: Small central disc protrusion T9-10: Small left-sided disc protrusion T10-11: Large ventral epidural abscess with cord compression T11-12: Large ventral epidural abscess extending to the left of midline with cord compression T12-L1: Discitis and osteomyelitis with endplate destruction and bilateral facet degeneration. Moderate spinal stenosis. IMPRESSION: Findings consistent with discitis and osteomyelitis at T12-L1. Large epidural abscess extends from the T12-L1 disc space to the T9-10 disc space. There is cord compression at T11 and T12 due to the large abscess. Paraspinous soft tissue infection and early psoas abscess bilaterally at T12-L1. These results were called by telephone at the time of interpretation on 09/27/2018 at 6:09 pm to Dr. Vanita Panda , who verbally acknowledged these results. Electronically Signed   By: Franchot Gallo M.D.   On: 09/27/2018 18:10   Mr Lumbar Spine W Wo Contrast (assess For Abscess, Cord Compression)  Result Date: 10/17/2018 CLINICAL DATA:  Initial evaluation for persistent and worsening back pain, recently identified epidural abscess. Or arm a are EXAM: MRI THORACIC AND LUMBAR SPINE WITHOUT AND WITH CONTRAST TECHNIQUE: Multiplanar and multiecho pulse sequences of the thoracic and lumbar spine were obtained without and with intravenous contrast. CONTRAST:  80 cc of Gadavist. COMPARISON:  Prior MRI from 09/27/2018. FINDINGS: MRI THORACIC SPINE FINDINGS Alignment: Stable alignment with preservation of the normal thoracic kyphosis. No interval listhesis or subluxation. Vertebrae: Changes compatible with osteomyelitis discitis again seen about the T12-L1 interspace. Associated fluid within the T12-L1 interspace has increased. Persistent adjacent endplate edema and enhancement with progressive endplate osseous erosion. Mild height loss at the anterior aspect of the L1 superior endplate is similar.  Susceptibility artifact from posterior fusion at L1 through the sacrum again noted. There has been interval performance of posterior decompressive laminectomies at T10-T12 for epidural abscess of activation. No other sites of infection within the thoracic spine. Underlying bone marrow signal intensity within normal limits. 8 mm benign hemangioma noted within the T2 vertebral body. No other discrete or worrisome osseous lesions. Cord: Persistent epidural abscess seen tracking superiorly from the T12-L1 interspace to approximately the level of T10. Overall, size is markedly decreased as compared to previous exam, measuring up to 7 mm in AP diameter within the left ventral space at the level of T12. Superimposed T2 hypointense nonenhancing material within the right ventral epidural space at the level of T12 measures 9 x 15 x 25 mm (series 22, image 37), suspected to reflect superimposed extruded disc material. Epidural enhancement seen within the dorsal epidural space extending from T10-11 through T12-L1, which could be related to infection and/or postoperative changes. Small amount of Peri discal enhancement at T8-9 noted within the ventral epidural space related to adjacent small disc protrusion. No other abnormal enhancement. Signal intensity within the underlying thoracic spinal cord grossly within normal limits, with no convincing underlying cord signal abnormality. Paraspinal and other soft tissues: Persistent paraspinous edema and enhancement within the bilateral psoas musculature adjacent to T12-L1. No definite discrete or drainable soft tissue abscess. Postoperative changes from interval laminectomy at T10-T12. Trace layering bilateral pleural effusions. T2 hyperintense simple left renal cyst partially visualized. Disc levels: T7-8: Shallow broad base right central disc protrusion flattens the right ventral thecal sac. No significant stenosis or cord deformity.  Appearance is stable. T8-9: Central disc  protrusion indents the ventral thecal sac with mild spinal stenosis. No cord deformity. Associated mild peridiscal enhancement. Appearance is stable. T9-10: Small left-sided disc protrusion without stenosis or cord deformity. Appearance is unchanged. T10-11: Mild epidural phlegmon/abscess, predominantly involving the left ventral epidural space, improved from previous. No associated cord compression now seen at this level. Residual mild spinal stenosis. T11-12: Persistent ventral epidural abscess, greater on the left, but decreased in size from previous. Interval decompressive laminectomy. Thecal sac remains patent with residual mild spinal stenosis, improved from previous. T12-L1: Persistent osteomyelitis discitis with endplate destruction and bilateral facet hypertrophy. Probable superimposed right subarticular disc extrusion with superior migration as above. Epidural phlegmon/abscess within the ventral epidural space as well. Associated severe spinal stenosis, slightly worsened as compared to previous exam. Thecal sac measures 6 mm in AP diameter (series 22, image 40), previously 8 mm in AP diameter on prior study (series 20, image 41 on MRI from 09/27/2018). Severe left with moderate right foraminal stenosis. MRI LUMBAR SPINE FINDINGS Segmentation: Standard. Lowest well-formed disc labeled the L5-S1 level. Alignment: Straightening of the normal lumbar lordosis with underlying mild levoscoliosis. Trace chronic retrolisthesis of L2 on L3. 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 Otherwise vertebral body heights are maintained without evidence for acute or chronic fracture. Susceptibility artifact from prior posterior and interbody fusion at L1 through S1. Underlying bone marrow signal intensity grossly within normal limits. No discrete or worrisome osseous lesions. No other abnormal marrow edema or enhancement elsewhere  within the lumbar spine. Conus medullaris: Extends to the L1 level and appears normal. Paraspinal and other soft tissues: Paraspinous edema adjacent to the T12-L1 interspace related to acute infection. No definite discrete abscess or drainable fluid collection. Chronic postsurgical changes present within the posterior paraspinous soft tissues. Superimposed small chronic postoperative seroma noted at the laminectomy site. Multiple T2 hyperintense renal cysts noted bilaterally, visualized visceral structures otherwise unremarkable. Greater than right. Disc levels: L1-2: Chronic intervertebral disc space narrowing with diffuse disc bulge and disc desiccation. Prior posterior decompression with fusion. No residual stenosis. L2-3: Chronic intervertebral disc space narrowing with disc desiccation. Prior posterior decompression with fusion. No residual stenosis. L3-4: Prior posterior decompression with fusion. No residual stenosis. L4-5: Prior posterior and interbody fusion with posterior decompression. No residual stenosis. L5-S1: Prior posterior and interbody fusion with decompression. No residual stenosis. IMPRESSION: MRI THORACIC SPINE IMPRESSION: 1. 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 Persistent but decreased size of epidural phlegmon/abscess extending from T10 through T12-L1. Overall, previously seen spinal stenosis has improved at the T10-11 and T11-12 levels status post laminectomy, although appears slightly worsened at the level of T12-L1 related to degenerative changes, epidural abscess, and probable superimposed right subarticular disc extrusion. Associated cord compression at T12-L1 without frank cord signal changes. 2. Associated mild paraspinous soft tissue infection adjacent to the T12-L1 interspace without visible abscess or drainable fluid collection. 3. Additional multifocal disc  protrusions as detailed above, otherwise unchanged from prior. MRI LUMBAR SPINE IMPRESSION: 1. No evidence for acute infection elsewhere within the lumbar spine. No other acute abnormality identified. 2. Postoperative changes from prior posterior decompression with fusion at L1 through S1. No significant residual stenosis. Electronically Signed   By: Jeannine Boga M.D.   On: 10/17/2018 18:37   Dg C-arm 1-60 Min  Result Date: 09/28/2018 CLINICAL DATA:  Localization image.  Limited ectomy for epidural abscess. EXAM: THORACOLUMBAR SPINE 1V COMPARISON:  None. FINDINGS: Single lateral intraoperative fluoroscopic images provided. Based on the lumbar spine MRI of 09/27/2018, surgical instruments overlie the posterior elements of the T12 vertebral body. Fluoroscopy provided for 7 seconds. IMPRESSION: Surgical instruments overlying the posterior elements of the T12 vertebral body. Electronically Signed   By: Franki Cabot M.D.   On: 09/28/2018 14:17   Dg Hip Unilat W Or Wo Pelvis 2-3 Views Right  Result Date: 10/17/2018 CLINICAL DATA:  Fall 3 days ago EXAM: DG HIP (WITH OR WITHOUT PELVIS) 2-3V RIGHT COMPARISON:  None. FINDINGS: There is no evidence of hip fracture or dislocation. There is no evidence of arthropathy or other focal bone abnormality. Lumbosacral fusion. IMPRESSION: No fracture or dislocation of the right hip or pelvis. Electronically Signed   By: Ulyses Jarred M.D.   On: 10/17/2018 20:41   [place] Ekg Site Rite  Result Date: 10/03/2018 If Site Rite image not attached, placement could not be confirmed due to current cardiac rhythm.  [place] Ekg Site Rite  Result Date: 10/01/2018 If Site Rite image not attached, placement could not be confirmed due to current cardiac rhythm.   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  There were no problems with mental status.  On Friday patient had been scheduled with oxycodone 5 mg 3 times daily with the knowledge of the foot and  approval of the family; that was clearly too much for her because on Monday she slept all day, but was reported as soon as she came out of her stupor that she asked for oxycodone.  Today we have discovered that patient is a pain medication patient and that she has had some problems with oxycodone use in the past, to the point that per family to our nursing director, family had to go to her house and take away her bottle of oxycodone.  This morning patient is on tramadol 100 mg with a plan for her to be on tramadol 100 mg 3 times daily.  Family is at the window watching her be able to do all of these maneuvers.  After the hospital administrator spoke with the family and after patient's nurse spoke with the family and after I spoke with the family the family still wanted patient to go to the emergency department to be evaluated for patient was sent to the ED only at the insistence of the family.     Time spent greater than 35 minutes Vonita Calloway D. Sheppard Coil, MD

## 2018-10-17 NOTE — ED Provider Notes (Signed)
MRI as expected. No worsening or significantly new findings. Mild increase in Cr. Given IVF. XR hip and ankle after fall look ok. I spoke with her daughter Steva Ready [phone] and one of her other daughters as well. They are concerned about their mother's lack of progress at her current facility. I acknowledged their concerns. Unfortunately she has a condition where the recovery is going to be measured in weeks to months. There does not appear to be any acute medical need for hospital admission. They want to get their mother into [facility] in [place]. I explained that this is a process that can be done while she is still in her current facility and there is no medical need to keep her in the ED until this is done.    Virgel Manifold, MD 10/18/18 [id number]

## 2018-10-17 NOTE — ED Notes (Signed)
Patient transported to X-ray 

## 2018-10-17 NOTE — ED Triage Notes (Signed)
Pt arrives via [facility] from [facility]. Pt was sent there 2 weeks ago after having spinal abscess removed. Pt has picc line in R upper arm and on ABX. Pt endorses lost balance sat. Night and fell, denies hitting head, but on thinners. Pt has had bilateral lower leg pain x3 weeks. Pt started on oxycodone yesterday r/t pain, was lethargic, switched to tramadol. Pt sent bc family reports overall decline in health. Pt alert and oriented. Family wanted pt sent for evaluation.

## 2018-10-18 DIAGNOSIS — R52 Pain, unspecified: Secondary | ICD-10-CM | POA: Diagnosis not present

## 2018-10-18 DIAGNOSIS — Z7401 Bed confinement status: Secondary | ICD-10-CM | POA: Diagnosis not present

## 2018-10-18 DIAGNOSIS — F29 Unspecified psychosis not due to a substance or known physiological condition: Secondary | ICD-10-CM | POA: Diagnosis not present

## 2018-10-18 DIAGNOSIS — M2550 Pain in unspecified joint: Secondary | ICD-10-CM | POA: Diagnosis not present

## 2018-10-18 NOTE — ED Notes (Signed)
All belongings with pt.

## 2018-10-18 NOTE — ED Notes (Signed)
PICC to right arm uncapped and open to air. Hub cleaned and sterile cap placed on hub. Transport requested this RN remove the "IV",told team it was a PICC and we are not to remove them. She came with the PICC in place and needs to return with it also.

## 2018-10-19 ENCOUNTER — Encounter: Payer: Self-pay | Admitting: Internal Medicine

## 2018-10-19 DIAGNOSIS — I10 Essential (primary) hypertension: Secondary | ICD-10-CM | POA: Diagnosis not present

## 2018-10-19 DIAGNOSIS — D649 Anemia, unspecified: Secondary | ICD-10-CM | POA: Diagnosis not present

## 2018-10-20 ENCOUNTER — Non-Acute Institutional Stay (SKILLED_NURSING_FACILITY): Payer: Medicare Other | Admitting: Internal Medicine

## 2018-10-20 ENCOUNTER — Encounter: Payer: Self-pay | Admitting: Internal Medicine

## 2018-10-20 DIAGNOSIS — G9341 Metabolic encephalopathy: Secondary | ICD-10-CM | POA: Diagnosis not present

## 2018-10-20 DIAGNOSIS — G4733 Obstructive sleep apnea (adult) (pediatric): Secondary | ICD-10-CM | POA: Diagnosis not present

## 2018-10-20 DIAGNOSIS — I5032 Chronic diastolic (congestive) heart failure: Secondary | ICD-10-CM

## 2018-10-20 DIAGNOSIS — M4645 Discitis, unspecified, thoracolumbar region: Secondary | ICD-10-CM

## 2018-10-20 DIAGNOSIS — R441 Visual hallucinations: Secondary | ICD-10-CM | POA: Diagnosis not present

## 2018-10-20 DIAGNOSIS — E7800 Pure hypercholesterolemia, unspecified: Secondary | ICD-10-CM | POA: Diagnosis not present

## 2018-10-20 DIAGNOSIS — M48061 Spinal stenosis, lumbar region without neurogenic claudication: Secondary | ICD-10-CM | POA: Diagnosis not present

## 2018-10-20 DIAGNOSIS — R410 Disorientation, unspecified: Secondary | ICD-10-CM | POA: Diagnosis not present

## 2018-10-20 DIAGNOSIS — I480 Paroxysmal atrial fibrillation: Secondary | ICD-10-CM | POA: Diagnosis not present

## 2018-10-20 DIAGNOSIS — I129 Hypertensive chronic kidney disease with stage 1 through stage 4 chronic kidney disease, or unspecified chronic kidney disease: Secondary | ICD-10-CM | POA: Diagnosis not present

## 2018-10-20 DIAGNOSIS — G062 Extradural and subdural abscess, unspecified: Secondary | ICD-10-CM | POA: Diagnosis not present

## 2018-10-20 DIAGNOSIS — D649 Anemia, unspecified: Secondary | ICD-10-CM | POA: Diagnosis not present

## 2018-10-20 DIAGNOSIS — R2681 Unsteadiness on feet: Secondary | ICD-10-CM | POA: Diagnosis not present

## 2018-10-20 DIAGNOSIS — M4620 Osteomyelitis of vertebra, site unspecified: Secondary | ICD-10-CM | POA: Diagnosis not present

## 2018-10-20 DIAGNOSIS — G629 Polyneuropathy, unspecified: Secondary | ICD-10-CM

## 2018-10-20 DIAGNOSIS — M6281 Muscle weakness (generalized): Secondary | ICD-10-CM | POA: Diagnosis not present

## 2018-10-20 DIAGNOSIS — E785 Hyperlipidemia, unspecified: Secondary | ICD-10-CM

## 2018-10-20 DIAGNOSIS — N183 Chronic kidney disease, stage 3 (moderate): Secondary | ICD-10-CM | POA: Diagnosis not present

## 2018-10-20 DIAGNOSIS — K219 Gastro-esophageal reflux disease without esophagitis: Secondary | ICD-10-CM

## 2018-10-20 DIAGNOSIS — I509 Heart failure, unspecified: Secondary | ICD-10-CM | POA: Diagnosis not present

## 2018-10-20 DIAGNOSIS — R41841 Cognitive communication deficit: Secondary | ICD-10-CM | POA: Diagnosis not present

## 2018-10-20 DIAGNOSIS — G20 Parkinson's disease: Secondary | ICD-10-CM

## 2018-10-20 DIAGNOSIS — E039 Hypothyroidism, unspecified: Secondary | ICD-10-CM | POA: Diagnosis not present

## 2018-10-20 DIAGNOSIS — Z20828 Contact with and (suspected) exposure to other viral communicable diseases: Secondary | ICD-10-CM | POA: Diagnosis not present

## 2018-10-20 DIAGNOSIS — R443 Hallucinations, unspecified: Secondary | ICD-10-CM | POA: Diagnosis not present

## 2018-10-20 DIAGNOSIS — Z79899 Other long term (current) drug therapy: Secondary | ICD-10-CM | POA: Diagnosis not present

## 2018-10-20 NOTE — Progress Notes (Signed)
Location:   [facility] Service:   SNF  Provider: Hennie Duos MD  PCP: [name], MD Patient Care Team: [name], MD as PCP - General (Family Medicine) Josue Hector, MD as PCP - Cardiology (Cardiology) Hazle Coca, MD (Inactive) as Consulting Physician (Neurosurgery) Tat, Eustace Quail, DO as Consulting Physician (Neurology)  Extended Emergency Contact Information Primary Emergency Contact: Pandora Leiter Address: [place], [place] [address] Johnnette Litter of [place] Phone: [phone] Mobile Phone: [phone] Relation: Spouse Secondary Emergency Contact: Nadeen Landau [place] of [place] Phone: [phone] Mobile Phone: [phone] Relation: Daughter  Allergies  Allergen Reactions   Lyrica [Pregabalin] Other (See Comments)    Felt loopy   Zocor [Simvastatin] Other (See Comments)    Myalgias   Relafen [Nabumetone] Rash    Chief Complaint  Patient presents with   Discharge Note    HPI:  78 y.o. female with CHF, DJD, discitis.  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  Course was uncomplicated.  CRP and ESR were still normal and at no point did patient have fever.  Intraoperative cultures have remained negative.  Infectious disease was consulted and patient was placed on vancomycin and Rocephin which is to be continued until 11/23/2018.  Patient was also placed on Decadron and oxycodone for pain.  Patient was admitted to skilled nursing facility for OT/PT for 8 weeks of antibiotics ending on 11/23/2018.  Patient was seen in [facility] emergency department on 5/12 with complaints of fatigue which yielded no new information or problems.  Patient is being transferred to [facility] on 10/21/2018 at family request.    Past Medical History:   Diagnosis Date   Allergic rhinitis    Arthritis    "all over"   CHF (congestive heart failure) (HCC)    Chronic back pain    DJD (degenerative joint disease)    GERD (gastroesophageal reflux disease)    Hyperlipidemia    Hypertension    Lumbar stenosis    OSA (obstructive sleep apnea)    "couldn't handle CPAP; use mouth guard some; not all the time" (01/06/2014)   PAF (paroxysmal atrial fibrillation) ([place])    Scoliosis    with radiculopathy L2-S1 with prior surgery   Small bowel obstruction ([place])    versus ileus after last bck surgery   Spondylosis     Past Surgical History:  Procedure Laterality Date   BACK SURGERY     CATARACT EXTRACTION W/ INTRAOCULAR LENS  IMPLANT, BILATERAL  2011   CHOLECYSTECTOMY N/A 07/07/2018   Procedure: LAPAROSCOPIC CHOLECYSTECTOMY;  Surgeon: Erroll Luna, MD;  Location: [facility];  Service: General;  Laterality: N/A;   COLONOSCOPY  12/09/2004   LIPOMA EXCISION  1980's   "fatty tumors"   [place]  02/2009   "ruptured disc"   NASAL SEPTUM SURGERY  80's   POSTERIOR LUMBAR FUSION  06/2010; 10/2011   "placed screws, rods, spacers both times"   REPAIR DURAL / CSF LEAK  02/2009   THORACIC LAMINECTOMY FOR EPIDURAL ABSCESS N/A 09/28/2018   Procedure: THORACIC NINE-THORACIC TEN, THORACIC TEN-THORACIC ELEVEN, THORACIC ELEVEN-THORACIC TWELVE LAMINECTOMIES FOR EPIDURAL ABSCESS;  Surgeon: Judith Part, MD;  Location: [facility];  Service: Neurosurgery;  Laterality: N/A;   TOTAL ABDOMINAL HYSTERECTOMY  02/1993  reports that she has never smoked. She has never used smokeless tobacco. She reports that she does not drink alcohol or use drugs. Social History   Socioeconomic History   Marital status: Married    Spouse name: [name]   Number of children: 2   Years of education: 12th    Highest education level: Not on file  Occupational History   Occupation: retired    Fish farm manager: RETIRED    Comment: clerical office work   Scientist, product/process development strain: Not on file   Food insecurity:    Worry: Not on file    Inability: Not on file   Transportation needs:    Medical: Not on file    Non-medical: Not on file  Tobacco Use   Smoking status: Never Smoker   Smokeless tobacco: Never Used  Substance and Sexual Activity   Alcohol use: No    Alcohol/week: 0.0 standard drinks    Comment: 02/01/2014 " glasseof wine once in a blue moon; < once/year"   Drug use: No   Sexual activity: Never    Comment: hysterectomy  Lifestyle   Physical activity:    Days per week: Not on file    Minutes per session: Not on file   Stress: Not on file  Relationships   Social connections:    Talks on phone: Not on file    Gets together: Not on file    Attends religious service: Not on file    Active member of club or organization: Not on file    Attends meetings of clubs or organizations: Not on file    Relationship status: Not on file   Intimate partner violence:    Fear of current or ex partner: Not on file    Emotionally abused: Not on file    Physically abused: Not on file    Forced sexual activity: Not on file  Other Topics Concern   Not on file  Social History Narrative   Patient lives at home with husband [name].   Patient is retired.   Patient has a high school education.    Patient has 2 children     Pertinent  Health Maintenance Due  Topic Date Due   DEXA SCAN  11/13/2018 (Originally 12/16/2007)   COLONOSCOPY  11/13/2018 (Originally 12/15/1992)   INFLUENZA VACCINE  01/06/2019   PNA vac Low Risk Adult  Completed    Medications: Allergies as of 10/20/2018      Reactions   Lyrica [pregabalin] Other (See Comments)   Felt loopy   Zocor [simvastatin] Other (See Comments)   Myalgias   Relafen [nabumetone] Rash      Medication List       Accurate as of Oct 20, 2018 11:59 PM. If you have any questions, ask your nurse or doctor.        acetaminophen 325 MG tablet Commonly  known as:  TYLENOL Take 325 mg by mouth daily as needed for mild pain.   calcium carbonate 500 MG chewable tablet Commonly known as:  TUMS - dosed in mg elemental calcium Chew 1 tablet by mouth 2 (two) times daily as needed for indigestion or heartburn.   carbidopa-levodopa 25-100 MG tablet Commonly known as:  SINEMET IR Take 2 tablets by mouth 3 (three) times daily.   cefTRIAXone  IVPB Commonly known as:  ROCEPHIN Inject 2 g into the vein daily. Indication: Subdural abscess, empiric treatment  Last Day of Therapy: 11/23/2018 Labs - Once weekly:  CBC/D and  BMP, Labs - Every other week:  ESR and CRP   flecainide 50 MG tablet Commonly known as:  TAMBOCOR TAKE 1 TABLET BY MOUTH 2 TIMES DAILY   furosemide 20 MG tablet Commonly known as:  LASIX Take 20 mg by mouth daily.   gabapentin 600 MG tablet Commonly known as:  NEURONTIN Take 600 mg by mouth 2 (two) times daily.   methocarbamol 500 MG tablet Commonly known as:  ROBAXIN Take 250 mg by mouth 2 (two) times a day. 0.5 tablet to = 250 mg for muscle spasms   metoprolol succinate 100 MG 24 hr tablet Commonly known as:  TOPROL-XL TAKE 1 TABLET BY MOUTH DAILY   pantoprazole 40 MG tablet Commonly known as:  PROTONIX Take 40 mg by mouth 2 (two) times daily.   polyethylene glycol 17 g packet Commonly known as:  MIRALAX / GLYCOLAX Take 17 g by mouth daily.   potassium chloride SA 20 MEQ tablet Commonly known as:  K-DUR Take 40 mEq by mouth daily.   pramipexole 1 MG tablet Commonly known as:  MIRAPEX Take 1 mg by mouth at bedtime.   pravastatin 40 MG tablet Commonly known as:  PRAVACHOL Take 40 mg by mouth at bedtime.   saccharomyces boulardii 250 MG capsule Commonly known as:  FLORASTOR Take 250 mg by mouth daily.   traMADol 50 MG tablet Commonly known as:  ULTRAM Take 2 tablets (100 mg total) by mouth 3 (three) times daily. At 6a, 2p, and 10p   Travatan Z 0.004 % Soln ophthalmic solution Generic drug:   Travoprost (BAK Free) Place 1 drop into both eyes at bedtime.   vancomycin 750 MG Solr injection Commonly known as:  VANCOCIN Inject 750 mg into the vein every 12 (twelve) hours. In 250 mg NS until 11/23/2018 - have pharmacy to dose   warfarin 2 MG tablet Commonly known as:  COUMADIN Take as directed by the anticoagulation clinic. If you are unsure how to take this medication, talk to your nurse or doctor. Original instructions:  Take 2 mg by mouth daily.        Vitals:   10/20/18 1430  BP: 130/64  Pulse: 70  Resp: 16  Temp: (!) 97 F (36.1 C)   There is no height or weight on file to calculate BMI.  Physical Exam  GENERAL APPEARANCE: Alert, conversant. No acute distress.  HEENT: Unremarkable. RESPIRATORY: Breathing is even, unlabored. Lung sounds are clear   CARDIOVASCULAR: Heart RRR no murmurs, rubs or gallops. No peripheral edema.  GASTROINTESTINAL: Abdomen is soft, non-tender, not distended w/ normal bowel sounds.  NEUROLOGIC: Cranial nerves 2-12 grossly intact. Moves all extremities   Labs reviewed: Basic Metabolic Panel: Recent Labs    07/07/18 0343 07/08/18 0556  07/09/18 0408  10/03/18 0329 10/04/18 0317 10/17/18 1526  NA 139 143   < >  --    < > 138 140 137  K 4.2 3.9   < >  --    < > 3.2* 3.5 4.0  CL 107 107   < >  --    < > 105 107 102  CO2 24 24   < >  --    < > 24 20* 26  GLUCOSE 90 137*   < >  --    < > 98 106* 116*  BUN 24* 14   < >  --    < > _0 CREATININE 0.98 0.89   < >  --    < >  0.81 0.71 1.08*  CALCIUM 9.4 9.9   < >  --    < > 8.7* 8.8* 9.0  MG 1.8 1.8  --  1.9  --   --   --   --   PHOS 3.6 3.6  --  3.0  --   --   --   --    < > = values in this interval not displayed.   No results found for: [facility] Liver Function Tests: Recent Labs    09/27/18 1425 09/28/18 0323 10/17/18 1526  AST 23 20 14*  ALT _0 ALKPHOS 89 88 102  BILITOT 1.1 1.1 0.7  PROT 5.8* 5.9* 6.1*  ALBUMIN 3.5 3.5 3.1*   Recent Labs     07/01/18 0057  LIPASE 30   No results for input(s): AMMONIA in the last 8760 hours. CBC: Recent Labs    07/09/18 0408 09/27/18 1425  10/03/18 0329 10/04/18 0317 10/17/18 1526  WBC 8.3 12.0*   < > 7.8 9.0 8.7  NEUTROABS 6.7 10.6*  --   --   --  7.0  HGB 12.6 13.0   < > 11.6* 12.2 11.7*  HCT 41.0 41.5   < > 35.8* 36.9 37.2  MCV 86.1 86.5   < > 84.6 84.1 86.9  PLT 171 171   < > 147* 164 202   < > = values in this interval not displayed.   Lipid No results for input(s): CHOL, HDL, LDLCALC, TRIG in the last 8760 hours. Cardiac Enzymes: No results for input(s): CKTOTAL, CKMB, CKMBINDEX, TROPONINI in the last 8760 hours. BNP: No results for input(s): BNP in the last 8760 hours. CBG: No results for input(s): GLUCAP in the last 8760 hours.  Procedures and Imaging Studies During Stay: Dg Thoracolumabar Spine  Result Date: 09/28/2018 CLINICAL DATA:  Localization image. Limited ectomy for epidural abscess. EXAM: THORACOLUMBAR SPINE 1V COMPARISON:  None. FINDINGS: Single lateral intraoperative fluoroscopic images provided. Based on the lumbar spine MRI of 09/27/2018, surgical instruments overlie the posterior elements of the T12 vertebral body. Fluoroscopy provided for 7 seconds. IMPRESSION: Surgical instruments overlying the posterior elements of the T12 vertebral body. Electronically Signed   By: Franki Cabot M.D.   On: 09/28/2018 14:17   Dg Ankle Complete Right  Result Date: 10/17/2018 CLINICAL DATA:  Fall EXAM: RIGHT ANKLE - COMPLETE 3+ VIEW COMPARISON:  None. FINDINGS: There is no evidence of fracture, dislocation, or joint effusion. There is no evidence of arthropathy or other focal bone abnormality. Soft tissues are unremarkable. IMPRESSION: Negative. Electronically Signed   By: Ulyses Jarred M.D.   On: 10/17/2018 20:43   Mr Thoracic Spine W Wo Contrast  Result Date: 10/17/2018 CLINICAL DATA:  Initial evaluation for persistent and worsening back pain, recently identified epidural  abscess. Or arm a are EXAM: MRI THORACIC AND LUMBAR SPINE WITHOUT AND WITH CONTRAST TECHNIQUE: Multiplanar and multiecho pulse sequences of the thoracic and lumbar spine were obtained without and with intravenous contrast. CONTRAST:  80 cc of Gadavist. COMPARISON:  Prior MRI from 09/27/2018. FINDINGS: MRI THORACIC SPINE FINDINGS Alignment: Stable alignment with preservation of the normal thoracic kyphosis. No interval listhesis or subluxation. Vertebrae: Changes compatible with osteomyelitis discitis again seen about the T12-L1 interspace. Associated fluid within the T12-L1 interspace has increased. Persistent adjacent endplate edema and enhancement with progressive endplate osseous erosion. Mild height loss at the anterior aspect of the L1 superior endplate is similar. Susceptibility artifact from posterior fusion at L1 through the sacrum  again noted. There has been interval performance of posterior decompressive laminectomies at T10-T12 for epidural abscess of activation. No other sites of infection within the thoracic spine. Underlying bone marrow signal intensity within normal limits. 8 mm benign hemangioma noted within the T2 vertebral body. No other discrete or worrisome osseous lesions. Cord: Persistent epidural abscess seen tracking superiorly from the T12-L1 interspace to approximately the level of T10. Overall, size is markedly decreased as compared to previous exam, measuring up to 7 mm in AP diameter within the left ventral space at the level of T12. Superimposed T2 hypointense nonenhancing material within the right ventral epidural space at the level of T12 measures 9 x 15 x 25 mm (series 22, image 37), suspected to reflect superimposed extruded disc material. Epidural enhancement seen within the dorsal epidural space extending from T10-11 through T12-L1, which could be related to infection and/or postoperative changes. Small amount of Peri discal enhancement at T8-9 noted within the ventral epidural  space related to adjacent small disc protrusion. No other abnormal enhancement. Signal intensity within the underlying thoracic spinal cord grossly within normal limits, with no convincing underlying cord signal abnormality. Paraspinal and other soft tissues: Persistent paraspinous edema and enhancement within the bilateral psoas musculature adjacent to T12-L1. No definite discrete or drainable soft tissue abscess. Postoperative changes from interval laminectomy at T10-T12. Trace layering bilateral pleural effusions. T2 hyperintense simple left renal cyst partially visualized. Disc levels: T7-8: Shallow broad base right central disc protrusion flattens the right ventral thecal sac. No significant stenosis or cord deformity. Appearance is stable. T8-9: Central disc protrusion indents the ventral thecal sac with mild spinal stenosis. No cord deformity. Associated mild peridiscal enhancement. Appearance is stable. T9-10: Small left-sided disc protrusion without stenosis or cord deformity. Appearance is unchanged. T10-11: Mild epidural phlegmon/abscess, predominantly involving the left ventral epidural space, improved from previous. No associated cord compression now seen at this level. Residual mild spinal stenosis. T11-12: Persistent ventral epidural abscess, greater on the left, but decreased in size from previous. Interval decompressive laminectomy. Thecal sac remains patent with residual mild spinal stenosis, improved from previous. T12-L1: Persistent osteomyelitis discitis with endplate destruction and bilateral facet hypertrophy. Probable superimposed right subarticular disc extrusion with superior migration as above. Epidural phlegmon/abscess within the ventral epidural space as well. Associated severe spinal stenosis, slightly worsened as compared to previous exam. Thecal sac measures 6 mm in AP diameter (series 22, image 40), previously 8 mm in AP diameter on prior study (series 20, image 41 on MRI from  09/27/2018). Severe left with moderate right foraminal stenosis. MRI LUMBAR SPINE FINDINGS Segmentation: Standard. Lowest well-formed disc labeled the L5-S1 level. Alignment: Straightening of the normal lumbar lordosis with underlying mild levoscoliosis. Trace chronic retrolisthesis of L2 on L3. 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 Otherwise vertebral body heights are maintained without evidence for acute or chronic fracture. Susceptibility artifact from prior posterior and interbody fusion at L1 through S1. Underlying bone marrow signal intensity grossly within normal limits. No discrete or worrisome osseous lesions. No other abnormal marrow edema or enhancement elsewhere within the lumbar spine. Conus medullaris: Extends to the L1 level and appears normal. Paraspinal and other soft tissues: Paraspinous edema adjacent to the T12-L1 interspace related to acute infection. No definite discrete abscess or drainable fluid collection. Chronic postsurgical changes present within the posterior paraspinous soft tissues. Superimposed small chronic postoperative seroma noted at the laminectomy site. Multiple T2 hyperintense renal cysts noted  bilaterally, visualized visceral structures otherwise unremarkable. Greater than right. Disc levels: L1-2: Chronic intervertebral disc space narrowing with diffuse disc bulge and disc desiccation. Prior posterior decompression with fusion. No residual stenosis. L2-3: Chronic intervertebral disc space narrowing with disc desiccation. Prior posterior decompression with fusion. No residual stenosis. L3-4: Prior posterior decompression with fusion. No residual stenosis. L4-5: Prior posterior and interbody fusion with posterior decompression. No residual stenosis. L5-S1: Prior posterior and interbody fusion with decompression. No residual stenosis. IMPRESSION: MRI THORACIC SPINE IMPRESSION: 1.  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 Persistent but decreased size of epidural phlegmon/abscess extending from T10 through T12-L1. Overall, previously seen spinal stenosis has improved at the T10-11 and T11-12 levels status post laminectomy, although appears slightly worsened at the level of T12-L1 related to degenerative changes, epidural abscess, and probable superimposed right subarticular disc extrusion. Associated cord compression at T12-L1 without frank cord signal changes. 2. Associated mild paraspinous soft tissue infection adjacent to the T12-L1 interspace without visible abscess or drainable fluid collection. 3. Additional multifocal disc protrusions as detailed above, otherwise unchanged from prior. MRI LUMBAR SPINE IMPRESSION: 1. No evidence for acute infection elsewhere within the lumbar spine. No other acute abnormality identified. 2. Postoperative changes from prior posterior decompression with fusion at L1 through S1. No significant residual stenosis. Electronically Signed   By: Jeannine Boga M.D.   On: 10/17/2018 18:37   Mr Thoracic Spine W Wo Contrast  Result Date: 09/27/2018 CLINICAL DATA:  Low back pain. Lumbar MRI demonstrated epidural abscess extending into the thoracic spine. EXAM: MRI THORACIC WITHOUT AND WITH CONTRAST TECHNIQUE: Multiplanar and multiecho pulse sequences of the thoracic spine were obtained without and with intravenous contrast. CONTRAST:  8 mL Gadovist IV COMPARISON:  Lumbar MRI 09/27/2018. Thoracic and lumbar MRI 07/01/2018 FINDINGS: MRI THORACIC SPINE FINDINGS Alignment:  Normal Vertebrae: Changes of discitis and osteomyelitis at T12-L1. There is increased fluid within the disc space with considerable endplate destruction which has progressed since 07/01/2018. Irregular enhancement of the endplates postcontrast administration. Pedicle screw  fusion bilaterally extending from L1 through S1. No other areas of infection or fracture in the thoracic spine Cord: Large epidural abscess in the spinal canal arising from T12-L1 and extending superiorly. Multilocular fluid collection is present in the ventral epidural space extending cranially to the T9-10 disc space. There is cord compression at T11-12 and T12-L1. Possible gas within the epidural abscess at T10. Paraspinal and other soft tissues: Paraspinous soft tissue thickening at T12-L1 extending into the psoas muscle bilaterally compatible with developing small abscess. Disc levels: Thoracic disc degeneration as below. T7-8: Small right-sided spurring with slight cord flattening. No significant stenosis T8-9: Small central disc protrusion T9-10: Small left-sided disc protrusion T10-11: Large ventral epidural abscess with cord compression T11-12: Large ventral epidural abscess extending to the left of midline with cord compression T12-L1: Discitis and osteomyelitis with endplate destruction and bilateral facet degeneration. Moderate spinal stenosis. IMPRESSION: Findings consistent with discitis and osteomyelitis at T12-L1. Large epidural abscess extends from the T12-L1 disc space to the T9-10 disc space. There is cord compression at T11 and T12 due to the large abscess. Paraspinous soft tissue infection and early psoas abscess bilaterally at T12-L1. These results were called by telephone at the time of interpretation on 09/27/2018 at 6:09 pm to Dr. Vanita Panda , who verbally acknowledged these results. Electronically Signed   By: Franchot Gallo M.D.   On: 09/27/2018 18:10   Mr  Lumbar Spine W Wo Contrast (assess For Abscess, Cord Compression)  Result Date: 10/17/2018 CLINICAL DATA:  Initial evaluation for persistent and worsening back pain, recently identified epidural abscess. Or arm a are EXAM: MRI THORACIC AND LUMBAR SPINE WITHOUT AND WITH CONTRAST TECHNIQUE: Multiplanar and multiecho pulse sequences of the  thoracic and lumbar spine were obtained without and with intravenous contrast. CONTRAST:  80 cc of Gadavist. COMPARISON:  Prior MRI from 09/27/2018. FINDINGS: MRI THORACIC SPINE FINDINGS Alignment: Stable alignment with preservation of the normal thoracic kyphosis. No interval listhesis or subluxation. Vertebrae: Changes compatible with osteomyelitis discitis again seen about the T12-L1 interspace. Associated fluid within the T12-L1 interspace has increased. Persistent adjacent endplate edema and enhancement with progressive endplate osseous erosion. Mild height loss at the anterior aspect of the L1 superior endplate is similar. Susceptibility artifact from posterior fusion at L1 through the sacrum again noted. There has been interval performance of posterior decompressive laminectomies at T10-T12 for epidural abscess of activation. No other sites of infection within the thoracic spine. Underlying bone marrow signal intensity within normal limits. 8 mm benign hemangioma noted within the T2 vertebral body. No other discrete or worrisome osseous lesions. Cord: Persistent epidural abscess seen tracking superiorly from the T12-L1 interspace to approximately the level of T10. Overall, size is markedly decreased as compared to previous exam, measuring up to 7 mm in AP diameter within the left ventral space at the level of T12. Superimposed T2 hypointense nonenhancing material within the right ventral epidural space at the level of T12 measures 9 x 15 x 25 mm (series 22, image 37), suspected to reflect superimposed extruded disc material. Epidural enhancement seen within the dorsal epidural space extending from T10-11 through T12-L1, which could be related to infection and/or postoperative changes. Small amount of Peri discal enhancement at T8-9 noted within the ventral epidural space related to adjacent small disc protrusion. No other abnormal enhancement. Signal intensity within the underlying thoracic spinal cord  grossly within normal limits, with no convincing underlying cord signal abnormality. Paraspinal and other soft tissues: Persistent paraspinous edema and enhancement within the bilateral psoas musculature adjacent to T12-L1. No definite discrete or drainable soft tissue abscess. Postoperative changes from interval laminectomy at T10-T12. Trace layering bilateral pleural effusions. T2 hyperintense simple left renal cyst partially visualized. Disc levels: T7-8: Shallow broad base right central disc protrusion flattens the right ventral thecal sac. No significant stenosis or cord deformity. Appearance is stable. T8-9: Central disc protrusion indents the ventral thecal sac with mild spinal stenosis. No cord deformity. Associated mild peridiscal enhancement. Appearance is stable. T9-10: Small left-sided disc protrusion without stenosis or cord deformity. Appearance is unchanged. T10-11: Mild epidural phlegmon/abscess, predominantly involving the left ventral epidural space, improved from previous. No associated cord compression now seen at this level. Residual mild spinal stenosis. T11-12: Persistent ventral epidural abscess, greater on the left, but decreased in size from previous. Interval decompressive laminectomy. Thecal sac remains patent with residual mild spinal stenosis, improved from previous. T12-L1: Persistent osteomyelitis discitis with endplate destruction and bilateral facet hypertrophy. Probable superimposed right subarticular disc extrusion with superior migration as above. Epidural phlegmon/abscess within the ventral epidural space as well. Associated severe spinal stenosis, slightly worsened as compared to previous exam. Thecal sac measures 6 mm in AP diameter (series 22, image 40), previously 8 mm in AP diameter on prior study (series 20, image 41 on MRI from 09/27/2018). Severe left with moderate right foraminal stenosis. MRI LUMBAR SPINE FINDINGS Segmentation: Standard. Lowest well-formed disc labeled  the L5-S1 level. Alignment: Straightening of the normal lumbar lordosis with underlying mild levoscoliosis. Trace chronic retrolisthesis of L2 on L3. 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 Otherwise vertebral body heights are maintained without evidence for acute or chronic fracture. Susceptibility artifact from prior posterior and interbody fusion at L1 through S1. Underlying bone marrow signal intensity grossly within normal limits. No discrete or worrisome osseous lesions. No other abnormal marrow edema or enhancement elsewhere within the lumbar spine. Conus medullaris: Extends to the L1 level and appears normal. Paraspinal and other soft tissues: Paraspinous edema adjacent to the T12-L1 interspace related to acute infection. No definite discrete abscess or drainable fluid collection. Chronic postsurgical changes present within the posterior paraspinous soft tissues. Superimposed small chronic postoperative seroma noted at the laminectomy site. Multiple T2 hyperintense renal cysts noted bilaterally, visualized visceral structures otherwise unremarkable. Greater than right. Disc levels: L1-2: Chronic intervertebral disc space narrowing with diffuse disc bulge and disc desiccation. Prior posterior decompression with fusion. No residual stenosis. L2-3: Chronic intervertebral disc space narrowing with disc desiccation. Prior posterior decompression with fusion. No residual stenosis. L3-4: Prior posterior decompression with fusion. No residual stenosis. L4-5: Prior posterior and interbody fusion with posterior decompression. No residual stenosis. L5-S1: Prior posterior and interbody fusion with decompression. No residual stenosis. IMPRESSION: MRI THORACIC SPINE IMPRESSION: 1. Persistent findings consistent with osteomyelitis discitis at T12-L1, with sequelae of interval decompressive laminectomy at T10-T12 for epidural  evacuation. Persistent but decreased size of epidural phlegmon/abscess extending from T10 through T12-L1. Overall, previously seen spinal stenosis has improved at the T10-11 and T11-12 levels status post laminectomy, although appears slightly worsened at the level of T12-L1 related to degenerative changes, epidural abscess, and probable superimposed right subarticular disc extrusion. Associated cord compression at T12-L1 without frank cord signal changes. 2. Associated mild paraspinous soft tissue infection adjacent to the T12-L1 interspace without visible abscess or drainable fluid collection. 3. Additional multifocal disc protrusions as detailed above, otherwise unchanged from prior. MRI LUMBAR SPINE IMPRESSION: 1. No evidence for acute infection elsewhere within the lumbar spine. No other acute abnormality identified. 2. Postoperative changes from prior posterior decompression with fusion at L1 through S1. No significant residual stenosis. Electronically Signed   By: Jeannine Boga M.D.   On: 10/17/2018 18:37   Dg C-arm 1-60 Min  Result Date: 09/28/2018 CLINICAL DATA:  Localization image. Limited ectomy for epidural abscess. EXAM: THORACOLUMBAR SPINE 1V COMPARISON:  None. FINDINGS: Single lateral intraoperative fluoroscopic images provided. Based on the lumbar spine MRI of 09/27/2018, surgical instruments overlie the posterior elements of the T12 vertebral body. Fluoroscopy provided for 7 seconds. IMPRESSION: Surgical instruments overlying the posterior elements of the T12 vertebral body. Electronically Signed   By: Franki Cabot M.D.   On: 09/28/2018 14:17   Dg Hip Unilat W Or Wo Pelvis 2-3 Views Right  Result Date: 10/17/2018 CLINICAL DATA:  Fall 3 days ago EXAM: DG HIP (WITH OR WITHOUT PELVIS) 2-3V RIGHT COMPARISON:  None. FINDINGS: There is no evidence of hip fracture or dislocation. There is no evidence of arthropathy or other focal bone abnormality. Lumbosacral fusion. IMPRESSION: No fracture  or dislocation of the right hip or pelvis. Electronically Signed   By: Ulyses Jarred M.D.   On: 10/17/2018 20:41   [place] E[employer] Site Rite  Result Date: 10/03/2018 If Site Rite image not attached, placement could not be confirmed due to current cardiac rhythm.  [place] [employer]  Result  Date: 10/01/2018 If Site Rite image not attached, placement could not be confirmed due to current cardiac rhythm.   Assessment/Plan:   Epidural abscess  Discitis of thoracolumbar region  Acute metabolic encephalopathy  Parkinsonism, unspecified Parkinsonism type (HCC)  Paroxysmal atrial fibrillation (HCC)  Chronic diastolic (congestive) heart failure (HCC)  Gastroesophageal reflux disease without esophagitis  Neuropathy  Hyperlipidemia, unspecified hyperlipidemia type   Patient is being discharged with the following home health services: OT/PT  Patient is being discharged with the following durable medical equipment: None  Patient has been advised to f/u with their PCP in 1-2 weeks to bring them up to date on their rehab stay.  Social services at facility was responsible for arranging this appointment.  Pt was provided with a 30 day supply of prescriptions for medications and refills must be obtained from their PCP.  For controlled substances, a more limited supply may be provided adequate until PCP appointment only.  Medications have been reconciled.   I spent greater than 30 minutes;> 50% of time with patient was spent reviewing records, labs, tests and studies, counseling and developing plan of care  Inocencio Homes, MD

## 2018-10-24 ENCOUNTER — Inpatient Hospital Stay: Payer: Medicare Other | Admitting: Infectious Diseases

## 2018-10-25 ENCOUNTER — Inpatient Hospital Stay: Payer: Medicare Other | Admitting: Infectious Diseases

## 2018-10-25 ENCOUNTER — Other Ambulatory Visit: Payer: Self-pay

## 2018-10-25 ENCOUNTER — Ambulatory Visit (INDEPENDENT_AMBULATORY_CARE_PROVIDER_SITE_OTHER): Payer: Medicare Other | Admitting: Infectious Diseases

## 2018-10-25 ENCOUNTER — Telehealth: Payer: Self-pay | Admitting: Pharmacist

## 2018-10-25 DIAGNOSIS — M4620 Osteomyelitis of vertebra, site unspecified: Secondary | ICD-10-CM | POA: Diagnosis not present

## 2018-10-25 DIAGNOSIS — G20 Parkinson's disease: Secondary | ICD-10-CM | POA: Diagnosis not present

## 2018-10-25 DIAGNOSIS — R441 Visual hallucinations: Secondary | ICD-10-CM

## 2018-10-25 DIAGNOSIS — G062 Extradural and subdural abscess, unspecified: Secondary | ICD-10-CM | POA: Diagnosis not present

## 2018-10-25 NOTE — Patient Instructions (Signed)
See order sheet sent to [facility] in [place], [place].

## 2018-10-25 NOTE — Progress Notes (Signed)
Subjective:    Patient ID: [name], female    DOB: 1940-09-25, 78 y.o.   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  She was hospitalized at [facility] in late April 2020 at which time she underwent debridement and drainage of her epidural abscess with operative cultures that were negative but followed several doses of antibiotics preoperatively.  She is due to complete 8 weeks of IV Rocephin and vancomycin tomorrow on November 23, 2018.  Following her hospital discharge, she was transferred from 1 skilled nursing facility but her family was unhappy with to [facility] in [facility].  She has remained an patient at this latter facility for the last 5 weeks.  Review of her weekly labs shows that her white blood cell count has remained within normal limits as has her creatinine.  Her vancomycin level was subtherapeutic 2 weeks ago at 12.6 but has now was risen to within the preferred range of 15-20.  Her CRP has also now improved from 1.43 to less than 0.60.  The patient and her nurse both state that she has been doing well with only expected back pain.  The stitches from her spinal wound were removed several weeks ago per her skilled nursing facility nurse.  She is not allowed to be present in our office today as her skilled nursing facility is currently quarantining patient due to the recent coronavirus pandemic.  Patient has had difficulty participating in rehab efforts due to participation and issues with her Parkinson medications.  Her nurse states that the hallucination she was having previously have improved since her neurologist decreased her Sinemet dosing.  Nursing reports that the patient has an anticipated discharge date on  Friday following completion of her antibiotic therapy.  Per nursing report the patient will be discharged to her daughter's home under her care.   Past Medical History:  Diagnosis Date   Allergic rhinitis    Arthritis    "all over"   CHF (congestive heart failure) (HCC)    Chronic back pain    DJD (degenerative joint disease)    GERD (gastroesophageal reflux disease)    Hyperlipidemia    Hypertension    Lumbar stenosis    OSA (obstructive sleep apnea)    "couldn't handle CPAP; use mouth guard some; not all the time" (01/06/2014)   PAF (paroxysmal atrial fibrillation) ([place])    Scoliosis    with radiculopathy L2-S1 with prior surgery   Small bowel obstruction ([place])    versus ileus after last bck surgery   Spondylosis     Past Surgical History:  Procedure Laterality Date   BACK SURGERY     CATARACT EXTRACTION W/ INTRAOCULAR LENS  IMPLANT, BILATERAL  2011   CHOLECYSTECTOMY N/A 07/07/2018   Procedure: LAPAROSCOPIC CHOLECYSTECTOMY;  Surgeon: Erroll Luna, MD;  Location: [place] [facility];  Service: General;  Laterality: N/A;   COLONOSCOPY  12/09/2004   LIPOMA EXCISION  1980's   "fatty tumors"   [place]  02/2009   "ruptured disc"   NASAL SEPTUM SURGERY  80's   POSTERIOR LUMBAR FUSION  06/2010; 10/2011   "placed screws, rods, spacers both times"   REPAIR DURAL / CSF LEAK  02/2009   THORACIC LAMINECTOMY FOR EPIDURAL ABSCESS N/A 09/28/2018   Procedure: THORACIC NINE-THORACIC TEN, THORACIC  TEN-THORACIC ELEVEN, THORACIC ELEVEN-THORACIC TWELVE LAMINECTOMIES FOR EPIDURAL ABSCESS;  Surgeon: Judith Part, MD;  Location: [facility];  Service: Neurosurgery;  Laterality: N/A;   TOTAL ABDOMINAL HYSTERECTOMY  02/1993     Family History  Problem Relation Age of Onset   Arthritis Mother    Heart attack Father    Hypertension Sister    Lung cancer Sister    Asthma Sister    Allergies Sister      Social History   Tobacco Use   Smoking status: Never Smoker     Smokeless tobacco: Never Used  Substance Use Topics   Alcohol use: No    Alcohol/week: 0.0 standard drinks    Comment: 02/01/2014 " glasseof wine once in a blue moon; < once/year"   Drug use: No      reports never being sexually active.   Outpatient Medications Prior to Visit  Medication Sig Dispense Refill   acetaminophen (TYLENOL) 325 MG tablet Take 325 mg by mouth daily as needed for mild pain.      calcium carbonate (TUMS - DOSED IN MG ELEMENTAL CALCIUM) 500 MG chewable tablet Chew 1 tablet by mouth 2 (two) times daily as needed for indigestion or heartburn.      carbidopa-levodopa (SINEMET IR) 25-100 MG tablet Take 2 tablets by mouth 3 (three) times daily. 90 tablet 0   cefTRIAXone (ROCEPHIN) IVPB Inject 2 g into the vein daily. Indication: Subdural abscess, empiric treatment  Last Day of Therapy: 11/23/2018 Labs - Once weekly:  CBC/D and BMP, Labs - Every other week:  ESR and CRP 51 Units 0   flecainide (TAMBOCOR) 50 MG tablet TAKE 1 TABLET BY MOUTH 2 TIMES DAILY 180 tablet 2   furosemide (LASIX) 20 MG tablet Take 20 mg by mouth daily.      gabapentin (NEURONTIN) 600 MG tablet Take 600 mg by mouth 2 (two) times daily.     methocarbamol (ROBAXIN) 500 MG tablet Take 250 mg by mouth 2 (two) times a day. 0.5 tablet to = 250 mg for muscle spasms     metoprolol succinate (TOPROL-XL) 100 MG 24 hr tablet TAKE 1 TABLET BY MOUTH DAILY 90 tablet 2   pantoprazole (PROTONIX) 40 MG tablet Take 40 mg by mouth 2 (two) times daily.      polyethylene glycol (MIRALAX / GLYCOLAX) packet Take 17 g by mouth daily. 14 each 0   potassium chloride SA (K-DUR,KLOR-CON) 20 MEQ tablet Take 40 mEq by mouth daily.      pramipexole (MIRAPEX) 1 MG tablet Take 1 mg by mouth at bedtime.     pravastatin (PRAVACHOL) 40 MG tablet Take 40 mg by mouth at bedtime.      saccharomyces boulardii (FLORASTOR) 250 MG capsule Take 250 mg by mouth daily.     traMADol (ULTRAM) 50 MG tablet Take 2 tablets (100  mg total) by mouth 3 (three) times daily. At 6a, 2p, and 10p 90 tablet 0   TRAVATAN Z 0.004 % SOLN ophthalmic solution Place 1 drop into both eyes at bedtime.   0   vancomycin (VANCOCIN) 750 MG SOLR injection Inject 750 mg into the vein every 12 (twelve) hours. In 250 mg NS until 11/23/2018 - have pharmacy to dose     warfarin (COUMADIN) 2 MG tablet Take 2 mg by mouth daily.     No facility-administered medications prior to visit.      Allergies  Allergen Reactions   Lyrica [Pregabalin] Other (See Comments)    Felt loopy  Zocor [Simvastatin] Other (See Comments)    Myalgias   Relafen [Nabumetone] Rash      Review of Systems  Constitutional: Positive for fatigue. Negative for chills and fever.  HENT: Negative for congestion, hearing loss and sinus pressure.   Eyes: Negative for photophobia, discharge, redness and visual disturbance.  Respiratory: Negative for apnea, cough, shortness of breath and wheezing.   Cardiovascular: Negative for chest pain and leg swelling.  Gastrointestinal: Negative for abdominal distention, abdominal pain, constipation, diarrhea, nausea and vomiting.  Endocrine: Negative for cold intolerance, heat intolerance, polydipsia and polyuria.  Genitourinary: Negative for dysuria, flank pain, frequency, urgency, vaginal bleeding and vaginal discharge.  Musculoskeletal: Positive for back pain. Negative for arthralgias, joint swelling and neck pain.  Skin: Negative for pallor and rash.  Allergic/Immunologic: Negative for immunocompromised state.  Neurological: Positive for speech difficulty and weakness. Negative for dizziness, seizures and headaches.  Hematological: Does not bruise/bleed easily.  Psychiatric/Behavioral: Negative for agitation, confusion, hallucinations and sleep disturbance. The patient is not nervous/anxious.        Objective:    There were no vitals filed for this visit. Physical Exam Gen: pleasant, NAD, A&Ox 3 Head: NCAT, no temporal  wasting evident EENT: PERRL, EOMI, MMM, adequate dentition Neck: supple, no JVD CV: NRRR, no murmurs evident Pulm: CTA bilaterally, no wheeze or retractions Abd: soft, NTND, +BS Extrems: trace LE edema, 2+ pulses Skin: no rashes, adequate skin turgor Neuro: CN II-XII grossly intact, +resting tremor and cogwheel rigity evident, gait was not assessed, A&Ox 3   Labs: Lab Results  Component Value Date   WBC 8.7 10/17/2018   HGB 11.7 (L) 10/17/2018   HCT 37.2 10/17/2018   MCV 86.9 10/17/2018   PLT 202 10/17/2018   Lab Results  Component Value Date   NA 137 10/17/2018   K 4.0 10/17/2018   CL 102 10/17/2018   CO2 26 10/17/2018   GLUCOSE 116 (H) 10/17/2018   BUN 16 10/17/2018   CREATININE 1.08 (H) 10/17/2018   CALCIUM 9.0 10/17/2018   MG 1.9 07/09/2018   PHOS 3.0 07/09/2018   Lab Results  Component Value Date   CRP 1.0 (H) 09/27/2018      Assessment & Plan:  Patient is a 78 year old white female with Parkinson's disease and recent hallucinations who underwent spinal fusion earlier this year complicated by postoperative epidural abscess and vertebral osteomyelitis.  1.  Vertebral osteomyelitis/epidural abscess- as stated above, the patient underwent debridement of an epidural abscess in late April 2020.  Cultures obtained at that time were negative yet purulence was seen intraoperatively the tract to the patient's bone and her hardware.  She will complete 8 weeks of IV Rocephin and vancomycin as of tomorrow November 23, 2018.  Both her white blood cell count and CRP have returned to normal limits, so her PICC line can be discontinued tomorrow after her last doses of antibiotics.  Due to the patient's remnant hardware and therefore risk for recurrence of infection of approximately 10%, the plan will be to transition the patient to doxycycline 100 mg p.o. twice daily beginning on November 24, 2018.  A prescription was sent to the patient's outpatient pharmacy of choice and a fax order for the  prescription was also sent to [facility] skilled nursing facility in [place], [place].  The patient will require oral consolidative antibiotics for minimum of 6 months but could be as long as 12 months depending on how she tolerates this medication.  Patient was advised to take her antibiotic  with some small amount of food in order to reduce her risk of nausea.  Patient will follow-up in my clinic in 3 weeks for compliance check of her medication.  2.  Parkinson's disease- the patient has had many issues related to medications for treatment of this disease.  While hospitalized and for the last several months per her daughter's report patient has had continued auditory and visual hallucinations.  The side effect of her Sinemet and her underlying Parkinson's disease has interfered with her care and her ability to ambulate in recent weeks.  She has followed with her neurologist who has lowered her Sinemet dosing which has improved many of her visual hallucinations per nursing report.  Unfortunately, the patient has been poorly participatory in efforts to rehab over the same period of time.  I will continue encourage the patient to follow-up with her neurologist for reassessment of her medications and her Parkinson's disease in general.

## 2018-10-25 NOTE — Telephone Encounter (Signed)
Will coordinate with [facility] to help manage patient's vancomycin.  Number for pharmacy department is [phone] and it is the [place] location.

## 2018-10-26 ENCOUNTER — Telehealth: Payer: Self-pay | Admitting: *Deleted

## 2018-10-26 NOTE — Telephone Encounter (Signed)
Relayed order to have [employer] manage iv antibiotic dosing and labs, asked that they send labs to [phone], attention Dr Prince Rome. Order repeated and verified with pharmacist Ronalee Belts. Landis Gandy, RN

## 2018-10-26 NOTE — Telephone Encounter (Addendum)
[facility] called to report that the patient labs drawn this morning Vanc trough 21.9 CRP 6.84 Creat 1.42 Fax number [phone] Advised will let the provider know the values.   Advised that their pharmacy will be dosing the IV antibiotics and to make sure they get the lab results as well.

## 2018-10-31 ENCOUNTER — Encounter: Payer: Self-pay | Admitting: Infectious Diseases

## 2018-10-31 NOTE — Progress Notes (Signed)
Virtual Visit via Video Note The purpose of this virtual visit is to provide medical care while limiting exposure to the novel coronavirus.    Consent was obtained for video visit:  Yes.   Answered questions that patient had about telehealth interaction:  Yes.   I discussed the limitations, risks, security and privacy concerns of performing an evaluation and management service by telemedicine. I also discussed with the patient that there may be a patient responsible charge related to this service. The patient expressed understanding and agreed to proceed.  Pt location: Home Physician Location: office Name of referring provider:  [name], MD I connected with [name] at patients initiation/request on 11/02/2018 at 11:00 AM EDT by video enabled telemedicine application and verified that I am speaking with the correct person using two identifiers. Pt MRN:  [id number] Pt DOB:  [birth year] Video Participants:  [name];  Janett Billow (nurse)   History of Present Illness:  Patient seen today for follow-up of parkinsonism.  She is currently on carbidopa/levodopa 25/100, 2 tablets 3 times per day (currently receiving them at 8 AM/2 PM/8 PM per nurse at rehab facility) and she is supposed to be on pramipexole, 0.5 mg at night for restless leg, but the nurse reports that she is on pramipexole, 1 mg at night.  Last visit, I was concerned about the patient's low back pain and her history of discitis and I sent her to neurosurgery for evaluation.  I did not get any records after that, so I just called for them and I received a copy of those notes yesterday.  She was seen by Dr. Maryjean Ka on April 15.  He reordered her MRI of the spine, which was completed on April 22.  I only have the report.  This demonstrated progressive collapse of the disc at T12-L1 consistent with discitis/osteomyelitis.  There was a large ventral epidural fluid collection, consistent with abscess, that extended into  the thoracic spine.  Patient ultimately underwent another surgery/laminectomy for the abscess.  Surgery resulted in left foot drop.  Records indicate that her nursing facility stay postoperatively was complicated by oversedation and therefore decreased activity.  Nurse at the facility reports confusion and agitation.  She has had hallucinations.  She is on tramadol, but has not received that since Monday.  The infectious disease doctor told them that it was from her carbidopa/levodopa and ordered a "carbidopa/levodopa level" but the nurse states it is not yet back.  The patient is on a higher dose of gabapentin than she was.  She was on 300 mg at bedtime and is now on 600 mg twice per day.  She is walking some with therapy.  She walked 35 feet today with therapy.   Current Outpatient Medications on File Prior to Visit  Medication Sig Dispense Refill   acetaminophen (TYLENOL) 325 MG tablet Take 325 mg by mouth daily as needed for mild pain.      calcium carbonate (TUMS - DOSED IN MG ELEMENTAL CALCIUM) 500 MG chewable tablet Chew 1 tablet by mouth 2 (two) times daily as needed for indigestion or heartburn.      carbidopa-levodopa (SINEMET IR) 25-100 MG tablet Take 2 tablets by mouth 3 (three) times daily. 90 tablet 0   cefTRIAXone (ROCEPHIN) IVPB Inject 2 g into the vein daily. Indication: Subdural abscess, empiric treatment  Last Day of Therapy: 11/23/2018 Labs - Once weekly:  CBC/D and BMP, Labs - Every other week:  ESR and CRP 51 Units 0  flecainide (TAMBOCOR) 50 MG tablet TAKE 1 TABLET BY MOUTH 2 TIMES DAILY 180 tablet 2   furosemide (LASIX) 20 MG tablet Take 20 mg by mouth daily.      gabapentin (NEURONTIN) 600 MG tablet Take 600 mg by mouth 2 (two) times daily.     methocarbamol (ROBAXIN) 500 MG tablet Take 250 mg by mouth 2 (two) times a day. 0.5 tablet to = 250 mg for muscle spasms     metoprolol succinate (TOPROL-XL) 100 MG 24 hr tablet TAKE 1 TABLET BY MOUTH DAILY 90 tablet 2    pantoprazole (PROTONIX) 40 MG tablet Take 40 mg by mouth 2 (two) times daily.      polyethylene glycol (MIRALAX / GLYCOLAX) packet Take 17 g by mouth daily. 14 each 0   potassium chloride SA (K-DUR,KLOR-CON) 20 MEQ tablet Take 40 mEq by mouth daily.      pramipexole (MIRAPEX) 1 MG tablet Take 1 mg by mouth at bedtime.     pravastatin (PRAVACHOL) 40 MG tablet Take 40 mg by mouth at bedtime.      saccharomyces boulardii (FLORASTOR) 250 MG capsule Take 250 mg by mouth daily.     traMADol (ULTRAM) 50 MG tablet Take 2 tablets (100 mg total) by mouth 3 (three) times daily. At 6a, 2p, and 10p 90 tablet 0   TRAVATAN Z 0.004 % SOLN ophthalmic solution Place 1 drop into both eyes at bedtime.   0   vancomycin (VANCOCIN) 750 MG SOLR injection Inject 750 mg into the vein every 12 (twelve) hours. In 250 mg NS until 11/23/2018 - have pharmacy to dose     warfarin (COUMADIN) 2 MG tablet Take 2 mg by mouth daily.     No current facility-administered medications on file prior to visit.      Observations/Objective:   Vitals:   11/02/18 1035  Weight: 197 lb (89.4 kg)  Height: '5\' 5"'$  (1.651 m)   GEN:  The patient appears stated age and is in NAD.  Neurological examination:  Orientation: The patient is alert and oriented to person and place.  She is clearly confused.  She does state that she would like to leave the facility. Cranial nerves: There is good facial symmetry. There is mild facial hypomimia.  The speech is fluent and clear. Soft palate rises symmetrically and there is no tongue deviation. Hearing is intact to conversational tone. Motor: Strength is at least antigravity x 4.   Shoulder shrug is equal and symmetric.  There is no pronator drift.  Movement examination: Tone: unable Abnormal movements: None Coordination:  There is  decremation with RAM's, with finger taps and hand opening and closing bilaterally Gait and Station: Not tested.  The nurse states that she really has not walked,  with the exception of with therapy and even that has been limited.    Assessment and Plan:   1.  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  Patient has had a square wave jerks, diplopia within the first 3 years of onset and mild downgaze paresis.  -Levodopa challenge test was completed and levodopa was only of mild benefit.  However, the patient thinks it is helpful  -I am going to just slightly decrease carbidopa/levodopa 25/100, from 2 tablets 3 times per day to 1.5 tablets 3 times per day and move it from 8 AM/2 PM/8 PM (when the nursing home is giving it) to 7 AM/11 AM/4 PM.  Nurse Janett Billow took the order and repeated it back verbally over  the video chat.  I reassured the nurse at the home that with the infectious disease doctor told them (that a levodopa level was required because she was getting too much) was certainly not the case.  However, I also do not think it is providing much benefit.  The reason that she was on it, was because the patient thought that it was beneficial.  -Somewhere along the way since her surgery, her pramipexole got increased from 0.5 mg at night to 1 mg at night for restless leg.  I am going to decrease that back to 0.5 mg at night.  This was given verbally to nurse Janett Billow over the video chat  2.  Low back pain with history of discitis  -Following with neurosurgery.  -She is now in rehab.  3.  Confusion, agitation, hallucinations  -Suspect due to an abnormal environment, with multiple medication changes.  She is getting Ultram as needed.  I am adjusting my medications as I can.  She also has had a great increase in her gabapentin, which I think is also contributing.  She used to be on 300 mg at bedtime and is currently on 600 mg twice per day.  I did not change this, as I am not prescribing it, nor am I controlling her pain, but I do think that this medication needs to be looked at as a source for confusion and hallucinations.   Follow Up  Instructions:  Follow-up in 4 to 6 weeks through the E- visit  -I discussed the assessment and treatment plan with the patient. The patient was provided an opportunity to ask questions and all were answered. The patient agreed with the plan and demonstrated an understanding of the instructions.   The patient was advised to call back or seek an in-person evaluation if the symptoms worsen or if the condition fails to improve as anticipated.    Total Time spent in visit with the patient was:  25 min, of which more than 50% of the time was spent in  coordinating care.   Pt understands and agrees with the plan of care outlined.     Alonza Bogus, DO

## 2018-11-02 ENCOUNTER — Other Ambulatory Visit: Payer: Self-pay

## 2018-11-02 ENCOUNTER — Telehealth (INDEPENDENT_AMBULATORY_CARE_PROVIDER_SITE_OTHER): Payer: Medicare Other | Admitting: Neurology

## 2018-11-02 ENCOUNTER — Encounter: Payer: Self-pay | Admitting: Neurology

## 2018-11-02 DIAGNOSIS — R410 Disorientation, unspecified: Secondary | ICD-10-CM | POA: Diagnosis not present

## 2018-11-02 DIAGNOSIS — G20 Parkinson's disease: Secondary | ICD-10-CM | POA: Diagnosis not present

## 2018-11-02 DIAGNOSIS — R443 Hallucinations, unspecified: Secondary | ICD-10-CM | POA: Diagnosis not present

## 2018-11-21 ENCOUNTER — Telehealth: Payer: Self-pay | Admitting: *Deleted

## 2018-11-21 NOTE — Telephone Encounter (Signed)
Per Dr Prince Rome called [facility] to set up follow up visit prior to stopping the PICC medication.   Spoke with Nira Conn and was advised due to Covid cases they are not bringing residents to office visits but they will help with evisits.  Set up evisit for 11/22/18 at 1200. Provider is aware and number is in notes call and dial extension [phone] and nurse will get patient for call.

## 2018-11-22 ENCOUNTER — Ambulatory Visit (INDEPENDENT_AMBULATORY_CARE_PROVIDER_SITE_OTHER): Payer: Medicare Other | Admitting: Infectious Diseases

## 2018-11-22 ENCOUNTER — Other Ambulatory Visit: Payer: Self-pay

## 2018-11-22 ENCOUNTER — Encounter: Payer: Self-pay | Admitting: Infectious Diseases

## 2018-11-22 VITALS — BP 113/59 | HR 57 | Temp 96.7°F | Resp 16 | Wt 167.0 lb

## 2018-11-22 DIAGNOSIS — G062 Extradural and subdural abscess, unspecified: Secondary | ICD-10-CM | POA: Diagnosis not present

## 2018-11-22 DIAGNOSIS — M4620 Osteomyelitis of vertebra, site unspecified: Secondary | ICD-10-CM

## 2018-11-22 MED ORDER — DOXYCYCLINE HYCLATE 100 MG PO TABS: 100.0000 mg | ORAL_TABLET | Freq: Two times a day (BID) | ORAL | 3 refills | Status: DC

## 2018-11-22 NOTE — Progress Notes (Signed)
Virtual Visit via Telephone Note  I connected with [name] on 11/22/18 at 11:30 AM EDT by telephone and verified that I am speaking with the correct person using two identifiers.  Location: Patient: [facility] in [place], [place] Provider: Evern Core. Rogen Porte, MD   I discussed the limitations, risks, security and privacy concerns of performing an evaluation and management service by telephone and the availability of in person appointments. I also discussed with the patient that there may be a patient responsible charge related to this service. The patient expressed understanding and agreed to proceed.   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  She was hospitalized at [facility] in late April 2020 at which time she underwent debridement and drainage of her epidural abscess with operative cultures that were negative but followed several doses of antibiotics preoperatively.  She is due to complete 8 weeks of IV Rocephin and vancomycin tomorrow on November 23, 2018.  Following her hospital discharge, she was transferred from 1 skilled nursing facility but her family was unhappy with to [facility] in [facility].  She has remained an patient at this latter facility for the last 5 weeks.  Review of her weekly labs shows that her white blood cell count has remained within normal limits as has her creatinine.  Her vancomycin level was subtherapeutic 2 weeks ago at 12.6 but has now was risen to within the preferred range of 15-20.  Her CRP has also now improved from 1.43 to less than 0.60.  The patient and her nurse both state that she has been doing well with only expected back pain.  The stitches from her spinal wound were removed several  weeks ago per her skilled nursing facility nurse.  She is not allowed to be present in our office today as her skilled nursing facility is currently quarantining patient due to the recent coronavirus pandemic.  Patient has had difficulty participating in rehab efforts due to participation and issues with her Parkinson medications.  Her nurse states that the hallucination she was having previously have improved since her neurologist decreased her Sinemet dosing.  Nursing reports that the patient has an anticipated discharge date on Friday following completion of her antibiotic therapy.  Per nursing report the patient will be discharged to her daughter's home under her care.    Observations/Objective: Vitals:   11/22/18 1142  BP: (!) 113/59  Pulse: (!) 57  Resp: 16  Temp: (!) 96.7 F (35.9 C)  SpO2: 97%   Patient is generally pleasant and alert and oriented x3 with adequate verbal skills per our phone conversation today.  She denies any respiratory distress or palpitations.  A view of her spinal wound sent from her nurse at the skilled nursing facility is included in today's visual exam (see below).    Assessment and Plan: Patient is a 78 year old white female with Parkinson's disease and recent hallucinations who underwent spinal fusion earlier this year complicated by postoperative epidural abscess and vertebral osteomyelitis.  1.  Vertebral osteomyelitis/epidural abscess- as stated above, the patient underwent debridement of an epidural abscess in late April 2020.  Cultures obtained at that time were negative yet purulence was seen intraoperatively the tract to the patient's bone and her hardware.  She will complete 8 weeks of IV Rocephin and  vancomycin as of tomorrow November 23, 2018.  Both her white blood cell count and CRP have returned to normal limits, so her PICC line can be discontinued tomorrow after her last doses of antibiotics.  Due to the patient's remnant hardware and therefore risk  for recurrence of infection of approximately 10%, the plan will be to transition the patient to doxycycline 100 mg p.o. twice daily beginning on November 24, 2018.  A prescription was sent to the patient's outpatient pharmacy of choice and a fax order for the prescription was also sent to [facility] skilled nursing facility in [place], [place].  The patient will require oral consolidative antibiotics for minimum of 6 months but could be as long as 12 months depending on how she tolerates this medication.  Patient was advised to take her antibiotic with some small amount of food in order to reduce her risk of nausea.  Patient will follow-up in my clinic in 3 weeks for compliance check of her medication.  2.  Parkinson's disease- the patient has had many issues related to medications for treatment of this disease.  While hospitalized and for the last several months per her daughter's report patient has had continued auditory and visual hallucinations.  The side effect of her Sinemet and her underlying Parkinson's disease has interfered with her care and her ability to ambulate in recent weeks.  She has followed with her neurologist who has lowered her Sinemet dosing which has improved many of her visual hallucinations per nursing report.  Unfortunately, the patient has been poorly participatory in efforts to rehab over the same period of time.  I will continue encourage the patient to follow-up with her neurologist for reassessment of her medications and her Parkinson's disease in general.  Follow Up Instructions: Return to clinic in 3 weeks for reevaluation with Dr. Prince Rome.    I discussed the assessment and treatment plan with the patient. The patient was provided an opportunity to ask questions and all were answered. The patient agreed with the plan and demonstrated an understanding of the instructions.   The patient was advised to call back or seek an in-person evaluation if the symptoms worsen or if the  condition fails to improve as anticipated.  I provided 25 minutes of non-face-to-face time during this encounter.   Janine Ores, MD

## 2018-11-27 DIAGNOSIS — G20 Parkinson's disease: Secondary | ICD-10-CM | POA: Diagnosis not present

## 2018-11-27 DIAGNOSIS — E7800 Pure hypercholesterolemia, unspecified: Secondary | ICD-10-CM | POA: Diagnosis not present

## 2018-11-27 DIAGNOSIS — M48061 Spinal stenosis, lumbar region without neurogenic claudication: Secondary | ICD-10-CM | POA: Diagnosis not present

## 2018-11-27 DIAGNOSIS — I130 Hypertensive heart and chronic kidney disease with heart failure and stage 1 through stage 4 chronic kidney disease, or unspecified chronic kidney disease: Secondary | ICD-10-CM | POA: Diagnosis not present

## 2018-11-27 DIAGNOSIS — G2581 Restless legs syndrome: Secondary | ICD-10-CM | POA: Diagnosis not present

## 2018-11-27 DIAGNOSIS — G062 Extradural and subdural abscess, unspecified: Secondary | ICD-10-CM | POA: Diagnosis not present

## 2018-11-27 DIAGNOSIS — I480 Paroxysmal atrial fibrillation: Secondary | ICD-10-CM | POA: Diagnosis not present

## 2018-11-27 DIAGNOSIS — M519 Unspecified thoracic, thoracolumbar and lumbosacral intervertebral disc disorder: Secondary | ICD-10-CM | POA: Diagnosis not present

## 2018-11-27 DIAGNOSIS — G629 Polyneuropathy, unspecified: Secondary | ICD-10-CM | POA: Diagnosis not present

## 2018-11-27 DIAGNOSIS — M4645 Discitis, unspecified, thoracolumbar region: Secondary | ICD-10-CM | POA: Diagnosis not present

## 2018-11-27 DIAGNOSIS — E46 Unspecified protein-calorie malnutrition: Secondary | ICD-10-CM | POA: Diagnosis not present

## 2018-11-27 DIAGNOSIS — I5032 Chronic diastolic (congestive) heart failure: Secondary | ICD-10-CM | POA: Diagnosis not present

## 2018-11-27 DIAGNOSIS — K219 Gastro-esophageal reflux disease without esophagitis: Secondary | ICD-10-CM | POA: Diagnosis not present

## 2018-11-27 DIAGNOSIS — N183 Chronic kidney disease, stage 3 (moderate): Secondary | ICD-10-CM | POA: Diagnosis not present

## 2018-11-27 DIAGNOSIS — M4726 Other spondylosis with radiculopathy, lumbar region: Secondary | ICD-10-CM | POA: Diagnosis not present

## 2018-11-27 DIAGNOSIS — G4733 Obstructive sleep apnea (adult) (pediatric): Secondary | ICD-10-CM | POA: Diagnosis not present

## 2018-12-01 ENCOUNTER — Other Ambulatory Visit (HOSPITAL_COMMUNITY): Payer: Self-pay | Admitting: Neurological Surgery

## 2018-12-01 ENCOUNTER — Other Ambulatory Visit: Payer: Self-pay | Admitting: Neurological Surgery

## 2018-12-01 DIAGNOSIS — M47816 Spondylosis without myelopathy or radiculopathy, lumbar region: Secondary | ICD-10-CM

## 2018-12-01 DIAGNOSIS — N390 Urinary tract infection, site not specified: Secondary | ICD-10-CM | POA: Diagnosis not present

## 2018-12-02 ENCOUNTER — Other Ambulatory Visit: Payer: Self-pay

## 2018-12-02 ENCOUNTER — Ambulatory Visit (HOSPITAL_COMMUNITY)
Admission: RE | Admit: 2018-12-02 | Discharge: 2018-12-02 | Disposition: A | Discharge status: Home / Self Care | Payer: Medicare Other | Source: Ambulatory Visit | Attending: Neurological Surgery | Admitting: Neurological Surgery

## 2018-12-02 DIAGNOSIS — R12 Heartburn: Secondary | ICD-10-CM | POA: Diagnosis not present

## 2018-12-02 DIAGNOSIS — I639 Cerebral infarction, unspecified: Secondary | ICD-10-CM | POA: Diagnosis not present

## 2018-12-02 DIAGNOSIS — G9340 Encephalopathy, unspecified: Secondary | ICD-10-CM | POA: Diagnosis not present

## 2018-12-02 DIAGNOSIS — I959 Hypotension, unspecified: Secondary | ICD-10-CM | POA: Diagnosis not present

## 2018-12-02 DIAGNOSIS — M532X4 Spinal instabilities, thoracic region: Secondary | ICD-10-CM | POA: Diagnosis not present

## 2018-12-02 DIAGNOSIS — K859 Acute pancreatitis without necrosis or infection, unspecified: Secondary | ICD-10-CM | POA: Diagnosis not present

## 2018-12-02 DIAGNOSIS — N390 Urinary tract infection, site not specified: Secondary | ICD-10-CM | POA: Diagnosis not present

## 2018-12-02 DIAGNOSIS — E162 Hypoglycemia, unspecified: Secondary | ICD-10-CM | POA: Diagnosis not present

## 2018-12-02 DIAGNOSIS — I10 Essential (primary) hypertension: Secondary | ICD-10-CM | POA: Diagnosis not present

## 2018-12-02 DIAGNOSIS — M47816 Spondylosis without myelopathy or radiculopathy, lumbar region: Secondary | ICD-10-CM | POA: Insufficient documentation

## 2018-12-02 DIAGNOSIS — I110 Hypertensive heart disease with heart failure: Secondary | ICD-10-CM | POA: Diagnosis not present

## 2018-12-02 DIAGNOSIS — R279 Unspecified lack of coordination: Secondary | ICD-10-CM | POA: Diagnosis not present

## 2018-12-02 DIAGNOSIS — F05 Delirium due to known physiological condition: Secondary | ICD-10-CM | POA: Diagnosis not present

## 2018-12-02 DIAGNOSIS — G4733 Obstructive sleep apnea (adult) (pediatric): Secondary | ICD-10-CM | POA: Diagnosis present

## 2018-12-02 DIAGNOSIS — G062 Extradural and subdural abscess, unspecified: Secondary | ICD-10-CM | POA: Diagnosis not present

## 2018-12-02 DIAGNOSIS — Z743 Need for continuous supervision: Secondary | ICD-10-CM | POA: Diagnosis not present

## 2018-12-02 DIAGNOSIS — R0902 Hypoxemia: Secondary | ICD-10-CM | POA: Diagnosis not present

## 2018-12-02 DIAGNOSIS — M1711 Unilateral primary osteoarthritis, right knee: Secondary | ICD-10-CM | POA: Diagnosis not present

## 2018-12-02 DIAGNOSIS — Z4789 Encounter for other orthopedic aftercare: Secondary | ICD-10-CM | POA: Diagnosis not present

## 2018-12-02 DIAGNOSIS — R079 Chest pain, unspecified: Secondary | ICD-10-CM | POA: Diagnosis not present

## 2018-12-02 DIAGNOSIS — K5730 Diverticulosis of large intestine without perforation or abscess without bleeding: Secondary | ICD-10-CM | POA: Diagnosis not present

## 2018-12-02 DIAGNOSIS — N183 Chronic kidney disease, stage 3 (moderate): Secondary | ICD-10-CM | POA: Diagnosis not present

## 2018-12-02 DIAGNOSIS — K219 Gastro-esophageal reflux disease without esophagitis: Secondary | ICD-10-CM | POA: Diagnosis present

## 2018-12-02 DIAGNOSIS — M549 Dorsalgia, unspecified: Secondary | ICD-10-CM | POA: Diagnosis not present

## 2018-12-02 DIAGNOSIS — R410 Disorientation, unspecified: Secondary | ICD-10-CM | POA: Diagnosis not present

## 2018-12-02 DIAGNOSIS — N3000 Acute cystitis without hematuria: Secondary | ICD-10-CM | POA: Diagnosis not present

## 2018-12-02 DIAGNOSIS — Z20828 Contact with and (suspected) exposure to other viral communicable diseases: Secondary | ICD-10-CM | POA: Diagnosis not present

## 2018-12-02 DIAGNOSIS — G8929 Other chronic pain: Secondary | ICD-10-CM | POA: Diagnosis present

## 2018-12-02 DIAGNOSIS — I4891 Unspecified atrial fibrillation: Secondary | ICD-10-CM | POA: Diagnosis not present

## 2018-12-02 DIAGNOSIS — Z792 Long term (current) use of antibiotics: Secondary | ICD-10-CM | POA: Diagnosis not present

## 2018-12-02 DIAGNOSIS — S0990XA Unspecified injury of head, initial encounter: Secondary | ICD-10-CM | POA: Diagnosis not present

## 2018-12-02 DIAGNOSIS — Z7901 Long term (current) use of anticoagulants: Secondary | ICD-10-CM | POA: Diagnosis not present

## 2018-12-02 DIAGNOSIS — E161 Other hypoglycemia: Secondary | ICD-10-CM | POA: Diagnosis not present

## 2018-12-02 DIAGNOSIS — M4804 Spinal stenosis, thoracic region: Secondary | ICD-10-CM | POA: Diagnosis not present

## 2018-12-02 DIAGNOSIS — R52 Pain, unspecified: Secondary | ICD-10-CM | POA: Diagnosis not present

## 2018-12-02 DIAGNOSIS — D62 Acute posthemorrhagic anemia: Secondary | ICD-10-CM | POA: Diagnosis not present

## 2018-12-02 DIAGNOSIS — E785 Hyperlipidemia, unspecified: Secondary | ICD-10-CM | POA: Diagnosis present

## 2018-12-02 DIAGNOSIS — Z888 Allergy status to other drugs, medicaments and biological substances status: Secondary | ICD-10-CM | POA: Diagnosis not present

## 2018-12-02 DIAGNOSIS — G20 Parkinson's disease: Secondary | ICD-10-CM | POA: Diagnosis present

## 2018-12-02 DIAGNOSIS — M4324 Fusion of spine, thoracic region: Secondary | ICD-10-CM | POA: Diagnosis not present

## 2018-12-02 DIAGNOSIS — R933 Abnormal findings on diagnostic imaging of other parts of digestive tract: Secondary | ICD-10-CM | POA: Diagnosis not present

## 2018-12-02 DIAGNOSIS — G061 Intraspinal abscess and granuloma: Secondary | ICD-10-CM | POA: Diagnosis not present

## 2018-12-02 DIAGNOSIS — Z8739 Personal history of other diseases of the musculoskeletal system and connective tissue: Secondary | ICD-10-CM | POA: Diagnosis not present

## 2018-12-02 DIAGNOSIS — M79652 Pain in left thigh: Secondary | ICD-10-CM | POA: Diagnosis not present

## 2018-12-02 DIAGNOSIS — M4620 Osteomyelitis of vertebra, site unspecified: Secondary | ICD-10-CM | POA: Diagnosis not present

## 2018-12-02 DIAGNOSIS — G2581 Restless legs syndrome: Secondary | ICD-10-CM | POA: Diagnosis present

## 2018-12-02 DIAGNOSIS — E7800 Pure hypercholesterolemia, unspecified: Secondary | ICD-10-CM | POA: Diagnosis present

## 2018-12-02 DIAGNOSIS — K861 Other chronic pancreatitis: Secondary | ICD-10-CM | POA: Diagnosis not present

## 2018-12-02 DIAGNOSIS — M1990 Unspecified osteoarthritis, unspecified site: Secondary | ICD-10-CM | POA: Diagnosis not present

## 2018-12-02 DIAGNOSIS — R531 Weakness: Secondary | ICD-10-CM | POA: Diagnosis not present

## 2018-12-02 DIAGNOSIS — E861 Hypovolemia: Secondary | ICD-10-CM | POA: Diagnosis not present

## 2018-12-02 DIAGNOSIS — M48061 Spinal stenosis, lumbar region without neurogenic claudication: Secondary | ICD-10-CM | POA: Diagnosis not present

## 2018-12-02 DIAGNOSIS — I509 Heart failure, unspecified: Secondary | ICD-10-CM | POA: Diagnosis not present

## 2018-12-02 DIAGNOSIS — I5032 Chronic diastolic (congestive) heart failure: Secondary | ICD-10-CM | POA: Diagnosis not present

## 2018-12-02 DIAGNOSIS — M5126 Other intervertebral disc displacement, lumbar region: Secondary | ICD-10-CM | POA: Diagnosis not present

## 2018-12-02 DIAGNOSIS — M4805 Spinal stenosis, thoracolumbar region: Secondary | ICD-10-CM | POA: Diagnosis not present

## 2018-12-02 DIAGNOSIS — I9589 Other hypotension: Secondary | ICD-10-CM | POA: Diagnosis not present

## 2018-12-02 DIAGNOSIS — M4716 Other spondylosis with myelopathy, lumbar region: Secondary | ICD-10-CM | POA: Diagnosis not present

## 2018-12-02 DIAGNOSIS — I130 Hypertensive heart and chronic kidney disease with heart failure and stage 1 through stage 4 chronic kidney disease, or unspecified chronic kidney disease: Secondary | ICD-10-CM | POA: Diagnosis not present

## 2018-12-02 DIAGNOSIS — N10 Acute tubulo-interstitial nephritis: Secondary | ICD-10-CM | POA: Diagnosis not present

## 2018-12-02 DIAGNOSIS — R4182 Altered mental status, unspecified: Secondary | ICD-10-CM | POA: Diagnosis not present

## 2018-12-02 DIAGNOSIS — G960 Cerebrospinal fluid leak: Secondary | ICD-10-CM | POA: Diagnosis not present

## 2018-12-02 DIAGNOSIS — G9341 Metabolic encephalopathy: Secondary | ICD-10-CM | POA: Diagnosis not present

## 2018-12-02 DIAGNOSIS — Z1159 Encounter for screening for other viral diseases: Secondary | ICD-10-CM | POA: Diagnosis not present

## 2018-12-02 DIAGNOSIS — M160 Bilateral primary osteoarthritis of hip: Secondary | ICD-10-CM | POA: Diagnosis not present

## 2018-12-02 DIAGNOSIS — N179 Acute kidney failure, unspecified: Secondary | ICD-10-CM | POA: Diagnosis not present

## 2018-12-02 DIAGNOSIS — G959 Disease of spinal cord, unspecified: Secondary | ICD-10-CM | POA: Diagnosis not present

## 2018-12-02 DIAGNOSIS — M79651 Pain in right thigh: Secondary | ICD-10-CM | POA: Diagnosis not present

## 2018-12-02 DIAGNOSIS — M4800 Spinal stenosis, site unspecified: Secondary | ICD-10-CM | POA: Diagnosis not present

## 2018-12-02 DIAGNOSIS — I480 Paroxysmal atrial fibrillation: Secondary | ICD-10-CM | POA: Diagnosis present

## 2018-12-02 DIAGNOSIS — R102 Pelvic and perineal pain: Secondary | ICD-10-CM | POA: Diagnosis not present

## 2018-12-02 DIAGNOSIS — G992 Myelopathy in diseases classified elsewhere: Secondary | ICD-10-CM | POA: Diagnosis not present

## 2018-12-02 DIAGNOSIS — M4625 Osteomyelitis of vertebra, thoracolumbar region: Secondary | ICD-10-CM | POA: Diagnosis not present

## 2018-12-02 DIAGNOSIS — M4326 Fusion of spine, lumbar region: Secondary | ICD-10-CM | POA: Diagnosis not present

## 2018-12-02 DIAGNOSIS — M545 Low back pain: Secondary | ICD-10-CM | POA: Diagnosis not present

## 2018-12-04 ENCOUNTER — Other Ambulatory Visit: Payer: Self-pay

## 2018-12-04 ENCOUNTER — Encounter (HOSPITAL_COMMUNITY): Payer: Self-pay | Admitting: Emergency Medicine

## 2018-12-04 ENCOUNTER — Inpatient Hospital Stay (HOSPITAL_COMMUNITY)
Admission: EM | Admit: 2018-12-04 | Discharge: 2018-12-19 | DRG: 456 | Disposition: A | Discharge status: Skilled Nursing Facility | Payer: Medicare Other | Attending: Internal Medicine | Admitting: Internal Medicine

## 2018-12-04 ENCOUNTER — Emergency Department (HOSPITAL_COMMUNITY): Payer: Medicare Other

## 2018-12-04 DIAGNOSIS — M4716 Other spondylosis with myelopathy, lumbar region: Secondary | ICD-10-CM | POA: Diagnosis present

## 2018-12-04 DIAGNOSIS — M4625 Osteomyelitis of vertebra, thoracolumbar region: Principal | ICD-10-CM | POA: Diagnosis present

## 2018-12-04 DIAGNOSIS — H409 Unspecified glaucoma: Secondary | ICD-10-CM | POA: Diagnosis present

## 2018-12-04 DIAGNOSIS — J302 Other seasonal allergic rhinitis: Secondary | ICD-10-CM | POA: Diagnosis present

## 2018-12-04 DIAGNOSIS — G2581 Restless legs syndrome: Secondary | ICD-10-CM | POA: Diagnosis present

## 2018-12-04 DIAGNOSIS — M532X4 Spinal instabilities, thoracic region: Secondary | ICD-10-CM | POA: Diagnosis present

## 2018-12-04 DIAGNOSIS — G062 Extradural and subdural abscess, unspecified: Secondary | ICD-10-CM | POA: Diagnosis present

## 2018-12-04 DIAGNOSIS — I480 Paroxysmal atrial fibrillation: Secondary | ICD-10-CM | POA: Diagnosis present

## 2018-12-04 DIAGNOSIS — E860 Dehydration: Secondary | ICD-10-CM | POA: Diagnosis present

## 2018-12-04 DIAGNOSIS — K5289 Other specified noninfective gastroenteritis and colitis: Secondary | ICD-10-CM | POA: Diagnosis present

## 2018-12-04 DIAGNOSIS — M48061 Spinal stenosis, lumbar region without neurogenic claudication: Secondary | ICD-10-CM | POA: Diagnosis present

## 2018-12-04 DIAGNOSIS — D62 Acute posthemorrhagic anemia: Secondary | ICD-10-CM | POA: Diagnosis not present

## 2018-12-04 DIAGNOSIS — Z7901 Long term (current) use of anticoagulants: Secondary | ICD-10-CM

## 2018-12-04 DIAGNOSIS — Z8249 Family history of ischemic heart disease and other diseases of the circulatory system: Secondary | ICD-10-CM

## 2018-12-04 DIAGNOSIS — M4620 Osteomyelitis of vertebra, site unspecified: Secondary | ICD-10-CM | POA: Diagnosis present

## 2018-12-04 DIAGNOSIS — K219 Gastro-esophageal reflux disease without esophagitis: Secondary | ICD-10-CM | POA: Diagnosis present

## 2018-12-04 DIAGNOSIS — I5032 Chronic diastolic (congestive) heart failure: Secondary | ICD-10-CM | POA: Diagnosis present

## 2018-12-04 DIAGNOSIS — M160 Bilateral primary osteoarthritis of hip: Secondary | ICD-10-CM | POA: Diagnosis present

## 2018-12-04 DIAGNOSIS — E7800 Pure hypercholesterolemia, unspecified: Secondary | ICD-10-CM | POA: Diagnosis present

## 2018-12-04 DIAGNOSIS — M4800 Spinal stenosis, site unspecified: Secondary | ICD-10-CM

## 2018-12-04 DIAGNOSIS — R52 Pain, unspecified: Secondary | ICD-10-CM

## 2018-12-04 DIAGNOSIS — Z8261 Family history of arthritis: Secondary | ICD-10-CM

## 2018-12-04 DIAGNOSIS — G061 Intraspinal abscess and granuloma: Secondary | ICD-10-CM | POA: Diagnosis present

## 2018-12-04 DIAGNOSIS — Z888 Allergy status to other drugs, medicaments and biological substances status: Secondary | ICD-10-CM

## 2018-12-04 DIAGNOSIS — I959 Hypotension, unspecified: Secondary | ICD-10-CM | POA: Diagnosis present

## 2018-12-04 DIAGNOSIS — N3090 Cystitis, unspecified without hematuria: Secondary | ICD-10-CM | POA: Diagnosis present

## 2018-12-04 DIAGNOSIS — Z419 Encounter for procedure for purposes other than remedying health state, unspecified: Secondary | ICD-10-CM

## 2018-12-04 DIAGNOSIS — F05 Delirium due to known physiological condition: Secondary | ICD-10-CM | POA: Diagnosis present

## 2018-12-04 DIAGNOSIS — M549 Dorsalgia, unspecified: Secondary | ICD-10-CM

## 2018-12-04 DIAGNOSIS — G960 Cerebrospinal fluid leak: Secondary | ICD-10-CM | POA: Diagnosis not present

## 2018-12-04 DIAGNOSIS — Z90710 Acquired absence of both cervix and uterus: Secondary | ICD-10-CM

## 2018-12-04 DIAGNOSIS — I130 Hypertensive heart and chronic kidney disease with heart failure and stage 1 through stage 4 chronic kidney disease, or unspecified chronic kidney disease: Secondary | ICD-10-CM | POA: Diagnosis present

## 2018-12-04 DIAGNOSIS — N183 Chronic kidney disease, stage 3 (moderate): Secondary | ICD-10-CM | POA: Diagnosis present

## 2018-12-04 DIAGNOSIS — B965 Pseudomonas (aeruginosa) (mallei) (pseudomallei) as the cause of diseases classified elsewhere: Secondary | ICD-10-CM | POA: Diagnosis present

## 2018-12-04 DIAGNOSIS — G8929 Other chronic pain: Secondary | ICD-10-CM | POA: Diagnosis present

## 2018-12-04 DIAGNOSIS — M4805 Spinal stenosis, thoracolumbar region: Secondary | ICD-10-CM | POA: Diagnosis present

## 2018-12-04 DIAGNOSIS — N390 Urinary tract infection, site not specified: Secondary | ICD-10-CM

## 2018-12-04 DIAGNOSIS — M1711 Unilateral primary osteoarthritis, right knee: Secondary | ICD-10-CM | POA: Diagnosis present

## 2018-12-04 DIAGNOSIS — E785 Hyperlipidemia, unspecified: Secondary | ICD-10-CM | POA: Diagnosis present

## 2018-12-04 DIAGNOSIS — G9341 Metabolic encephalopathy: Secondary | ICD-10-CM | POA: Diagnosis present

## 2018-12-04 DIAGNOSIS — Z9049 Acquired absence of other specified parts of digestive tract: Secondary | ICD-10-CM

## 2018-12-04 DIAGNOSIS — G20 Parkinson's disease: Secondary | ICD-10-CM | POA: Diagnosis present

## 2018-12-04 DIAGNOSIS — M4804 Spinal stenosis, thoracic region: Secondary | ICD-10-CM | POA: Diagnosis present

## 2018-12-04 DIAGNOSIS — R5381 Other malaise: Secondary | ICD-10-CM | POA: Diagnosis present

## 2018-12-04 DIAGNOSIS — Z20828 Contact with and (suspected) exposure to other viral communicable diseases: Secondary | ICD-10-CM | POA: Diagnosis present

## 2018-12-04 DIAGNOSIS — N179 Acute kidney failure, unspecified: Secondary | ICD-10-CM

## 2018-12-04 DIAGNOSIS — G4733 Obstructive sleep apnea (adult) (pediatric): Secondary | ICD-10-CM | POA: Diagnosis present

## 2018-12-04 DIAGNOSIS — Z79899 Other long term (current) drug therapy: Secondary | ICD-10-CM

## 2018-12-04 DIAGNOSIS — Z1159 Encounter for screening for other viral diseases: Secondary | ICD-10-CM

## 2018-12-04 DIAGNOSIS — Z79891 Long term (current) use of opiate analgesic: Secondary | ICD-10-CM

## 2018-12-04 DIAGNOSIS — Z981 Arthrodesis status: Secondary | ICD-10-CM

## 2018-12-04 DIAGNOSIS — K859 Acute pancreatitis without necrosis or infection, unspecified: Secondary | ICD-10-CM

## 2018-12-04 LAB — CBC WITH DIFFERENTIAL/PLATELET
Abs Immature Granulocytes: 0.05 10*3/uL (ref 0.00–0.07)
Basophils Absolute: 0 10*3/uL (ref 0.0–0.1)
Basophils Relative: 0 %
Eosinophils Absolute: 0.2 10*3/uL (ref 0.0–0.5)
Eosinophils Relative: 2 %
HCT: 39.1 % (ref 36.0–46.0)
Hemoglobin: 12.1 g/dL (ref 12.0–15.0)
Immature Granulocytes: 1 %
Lymphocytes Relative: 6 %
Lymphs Abs: 0.6 10*3/uL — ABNORMAL LOW (ref 0.7–4.0)
MCH: 26.7 pg (ref 26.0–34.0)
MCHC: 30.9 g/dL (ref 30.0–36.0)
MCV: 86.3 fL (ref 80.0–100.0)
Monocytes Absolute: 0.6 10*3/uL (ref 0.1–1.0)
Monocytes Relative: 6 %
Neutro Abs: 8.1 10*3/uL — ABNORMAL HIGH (ref 1.7–7.7)
Neutrophils Relative %: 85 %
Platelets: 181 10*3/uL (ref 150–400)
RBC: 4.53 MIL/uL (ref 3.87–5.11)
RDW: 16.2 % — ABNORMAL HIGH (ref 11.5–15.5)
WBC: 9.5 10*3/uL (ref 4.0–10.5)
nRBC: 0 % (ref 0.0–0.2)

## 2018-12-04 LAB — PROTIME-INR
INR: 1.8 — ABNORMAL HIGH (ref 0.8–1.2)
Prothrombin Time: 20.4 seconds — ABNORMAL HIGH (ref 11.4–15.2)

## 2018-12-04 LAB — URINALYSIS, ROUTINE W REFLEX MICROSCOPIC
Bilirubin Urine: NEGATIVE
Glucose, UA: NEGATIVE mg/dL
Ketones, ur: NEGATIVE mg/dL
Nitrite: POSITIVE — AB
Protein, ur: NEGATIVE mg/dL
Specific Gravity, Urine: 1.018 (ref 1.005–1.030)
pH: 5 (ref 5.0–8.0)

## 2018-12-04 LAB — COMPREHENSIVE METABOLIC PANEL
ALT: 10 U/L (ref 0–44)
AST: 16 U/L (ref 15–41)
Albumin: 3.4 g/dL — ABNORMAL LOW (ref 3.5–5.0)
Alkaline Phosphatase: 97 U/L (ref 38–126)
Anion gap: 11 (ref 5–15)
BUN: 32 mg/dL — ABNORMAL HIGH (ref 8–23)
CO2: 26 mmol/L (ref 22–32)
Calcium: 9.5 mg/dL (ref 8.9–10.3)
Chloride: 96 mmol/L — ABNORMAL LOW (ref 98–111)
Creatinine, Ser: 1.33 mg/dL — ABNORMAL HIGH (ref 0.44–1.00)
GFR calc Af Amer: 45 mL/min — ABNORMAL LOW (ref >60)
GFR calc non Af Amer: 39 mL/min — ABNORMAL LOW (ref >60)
Glucose, Bld: 94 mg/dL (ref 70–99)
Potassium: 3.8 mmol/L (ref 3.5–5.1)
Sodium: 133 mmol/L — ABNORMAL LOW (ref 135–145)
Total Bilirubin: 0.8 mg/dL (ref 0.3–1.2)
Total Protein: 6.2 g/dL — ABNORMAL LOW (ref 6.5–8.1)

## 2018-12-04 LAB — CK: Total CK: 56 U/L (ref 38–234)

## 2018-12-04 LAB — LIPASE, BLOOD: Lipase: 196 U/L — ABNORMAL HIGH (ref 11–51)

## 2018-12-04 MED ORDER — SODIUM CHLORIDE 0.9 % IV BOLUS
1000.0000 mL | Freq: Once | INTRAVENOUS | Status: AC
  Administered 2018-12-04: 1000 mL via INTRAVENOUS

## 2018-12-04 MED ORDER — FENTANYL CITRATE (PF) 100 MCG/2ML IJ SOLN
50.0000 ug | Freq: Once | INTRAMUSCULAR | Status: AC
  Administered 2018-12-04: 50 ug via INTRAVENOUS
  Filled 2018-12-04: qty 2

## 2018-12-04 MED ORDER — SODIUM CHLORIDE 0.9 % IV SOLN
Freq: Once | INTRAVENOUS | Status: AC
  Administered 2018-12-04: via INTRAVENOUS

## 2018-12-04 MED ORDER — FENTANYL CITRATE (PF) 100 MCG/2ML IJ SOLN
50.0000 ug | Freq: Once | INTRAMUSCULAR | Status: AC
  Administered 2018-12-05: 50 ug via INTRAVENOUS

## 2018-12-04 NOTE — ED Provider Notes (Addendum)
[facility] EMERGENCY DEPARTMENT Provider Note   CSN: [id number] Arrival date & time: 12/04/18  1836    History   Chief Complaint Chief Complaint  Patient presents with   Weakness    HPI [name] is a 78 y.o. female.     Patient is a 78 year old female with history of hypertension, high cholesterol, heart failure, severe arthritis, lumbar fusion in the past complicated by recent abscesses of the spine, had surgery back in April with neurosurgery, currently on doxycycline.  Follows with infectious disease.  Has chronic left lower leg weakness at baseline per family.  Patient now at home with her husband.  Patient's home aide states that she not had anything to eat or drink today, concern as her pain is getting worse but overall patient has pain everywhere.  She has pain in her back, pain in her thighs.  Unknown if she has fallen.  Patient overall is unable to give me much history due to her pain.  The history is provided by the patient.  Weakness Severity:  Moderate Onset quality:  Gradual Timing:  Constant Progression:  Worsening Chronicity:  Recurrent Relieved by:  Nothing Worsened by:  Pain Associated symptoms: arthralgias, difficulty walking and myalgias   Associated symptoms: no abdominal pain, no chest pain, no cough, no dizziness, no dysuria, no fever, no nausea, no seizures, no shortness of breath and no vomiting     Past Medical History:  Diagnosis Date   Allergic rhinitis    Arthritis    "all over"   CHF (congestive heart failure) (HCC)    Chronic back pain    DJD (degenerative joint disease)    GERD (gastroesophageal reflux disease)    Hyperlipidemia    Hypertension    Lumbar stenosis    OSA (obstructive sleep apnea)    "couldn't handle CPAP; use mouth guard some; not all the time" (01/06/2014)   PAF (paroxysmal atrial fibrillation) ([place])    Scoliosis    with radiculopathy L2-S1 with prior surgery   Small bowel  obstruction ([place])    versus ileus after last bck surgery   Spondylosis     Patient Active Problem List   Diagnosis Date Noted   Vertebral osteomyelitis ([place]) 92/04/9416   Acute metabolic encephalopathy 40/81/4481   Chronic diastolic (congestive) heart failure ([place]) 10/07/2018   GERD (gastroesophageal reflux disease) 10/07/2018   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   Abnormal glucose tolerance test 07/01/2018   Colon, diverticulosis 07/01/2018   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   Glaucoma 07/01/2018   Chronic kidney disease (CKD), stage III (moderate) ([place]) 07/01/2018   Neuropathy 07/01/2018   Pure hypercholesterolemia 07/01/2018   Restless leg 07/01/2018   Discitis of thoracolumbar region 07/01/2018   Acute bronchitis due to infection 05/04/2017   Cough 04/21/2016   Preoperative cardiovascular examination 04/18/2015   Parkinsonism ([place]) 05/15/2014   Chronic anticoagulation -warfarin therapy 02/03/2014   Atrial fibrillation with RVR ([place]) 01/31/2014   Gait instability 11/16/2013   Encounter for therapeutic drug monitoring 07/20/2013   Edema 03/03/2010   ATRIAL FIBRILLATION 04/23/2008   Hyperlipidemia 01/31/2008   Obstructive sleep apnea 01/31/2008   GLAUCOMA 01/30/2008   Seasonal and perennial allergic rhinitis 01/30/2008   Osteoarthritis 01/30/2008    Past Surgical History:  Procedure Laterality Date   BACK SURGERY     CATARACT EXTRACTION W/ INTRAOCULAR LENS  IMPLANT, BILATERAL  2011   CHOLECYSTECTOMY N/A 07/07/2018   Procedure: LAPAROSCOPIC CHOLECYSTECTOMY;  Surgeon: Erroll Luna, MD;  Location: [facility];  Service: General;  Laterality: N/A;   COLONOSCOPY  12/09/2004   LIPOMA EXCISION  1980's   "fatty tumors"   [place] SURGERY  02/2009   "ruptured disc"   NASAL SEPTUM SURGERY  80's   POSTERIOR LUMBAR FUSION  06/2010; 10/2011   "placed screws, rods, spacers both times"   REPAIR DURAL / CSF LEAK  02/2009   THORACIC LAMINECTOMY FOR EPIDURAL ABSCESS  N/A 09/28/2018   Procedure: THORACIC NINE-THORACIC TEN, THORACIC TEN-THORACIC ELEVEN, THORACIC ELEVEN-THORACIC TWELVE LAMINECTOMIES FOR EPIDURAL ABSCESS;  Surgeon: Judith Part, MD;  Location: [facility];  Service: Neurosurgery;  Laterality: N/A;   TOTAL ABDOMINAL HYSTERECTOMY  02/1993     OB History   No obstetric history on file.      Home Medications    Prior to Admission medications   Medication Sig Start Date End Date Taking? Authorizing Provider  acetaminophen (TYLENOL) 325 MG tablet Take 325 mg by mouth daily as needed for mild pain.     [provider]  calcium carbonate (TUMS - DOSED IN MG ELEMENTAL CALCIUM) 500 MG chewable tablet Chew 1 tablet by mouth 2 (two) times daily as needed for indigestion or heartburn.     [provider]  carbidopa-levodopa (SINEMET IR) 25-100 MG tablet Take 2 tablets by mouth 3 (three) times daily. 07/09/18   Raiford Noble Latif, DO  doxycycline (VIBRA-TABS) 100 MG tablet Take 1 tablet (100 mg total) by mouth 2 (two) times daily. 11/22/18   Powers, Evern Core, MD  flecainide (TAMBOCOR) 50 MG tablet TAKE 1 TABLET BY MOUTH 2 TIMES DAILY 02/27/18   Josue Hector, MD  furosemide (LASIX) 20 MG tablet Take 20 mg by mouth daily.     [provider]  gabapentin (NEURONTIN) 600 MG tablet Take 600 mg by mouth 2 (two) times daily.    [provider]  methocarbamol (ROBAXIN) 500 MG tablet Take 250 mg by mouth 2 (two) times a day. 0.5 tablet to = 250 mg for muscle spasms    [provider]  metoprolol succinate (TOPROL-XL) 100 MG 24 hr tablet TAKE 1 TABLET BY MOUTH DAILY 01/30/18   Josue Hector, MD  pantoprazole (PROTONIX) 40 MG tablet Take 40 mg by mouth 2 (two) times daily.     [provider]  polyethylene glycol (MIRALAX / GLYCOLAX) packet Take 17 g by mouth daily. 07/10/18   Raiford Noble Latif, DO  potassium chloride SA (K-DUR,KLOR-CON) 20 MEQ tablet Take 40 mEq by mouth daily.     [provider]    pramipexole (MIRAPEX) 1 MG tablet Take 1 mg by mouth at bedtime.    [provider]  pravastatin (PRAVACHOL) 40 MG tablet Take 40 mg by mouth at bedtime.     [provider]  saccharomyces boulardii (FLORASTOR) 250 MG capsule Take 250 mg by mouth daily.    [provider]  traMADol (ULTRAM) 50 MG tablet Take 2 tablets (100 mg total) by mouth 3 (three) times daily. At [place], 2p, and 10p 10/16/18   Hennie Duos, MD  TRAVATAN Z 0.004 % SOLN ophthalmic solution Place 1 drop into both eyes at bedtime.  01/21/15   [provider]  vancomycin (VANCOCIN) 750 MG SOLR injection Inject 750 mg into the vein every 12 (twelve) hours. In 250 mg NS until 11/23/2018 - have pharmacy to dose    [provider]  warfarin (COUMADIN) 2 MG tablet Take 2 mg by mouth daily.    [provider]  Family History Family History  Problem Relation Age of Onset   Arthritis Mother    Heart attack Father    Hypertension Sister    Lung cancer Sister    Asthma Sister    Allergies Sister     Social History Social History   Tobacco Use   Smoking status: Never Smoker   Smokeless tobacco: Never Used  Substance Use Topics   Alcohol use: No    Alcohol/week: 0.0 standard drinks    Comment: 02/01/2014 " glasseof wine once in a blue moon; < once/year"   Drug use: No     Allergies   Lyrica [pregabalin], Zocor [simvastatin], and Relafen [nabumetone]   Review of Systems Review of Systems  Constitutional: Positive for fatigue. Negative for chills and fever.  HENT: Negative for ear pain and sore throat.   Eyes: Negative for pain and visual disturbance.  Respiratory: Negative for cough and shortness of breath.   Cardiovascular: Negative for chest pain and palpitations.  Gastrointestinal: Negative for abdominal pain, nausea and vomiting.  Genitourinary: Positive for decreased urine volume. Negative for dysuria and hematuria.  Musculoskeletal: Positive for  arthralgias, back pain, gait problem and myalgias. Negative for neck pain and neck stiffness.  Skin: Positive for wound. Negative for color change and rash.  Neurological: Positive for weakness. Negative for dizziness, tremors, seizures, syncope, speech difficulty and numbness.  All other systems reviewed and are negative.    Physical Exam Updated Vital Signs  ED Triage Vitals  Enc Vitals Group     BP 12/04/18 1838 (!) 117/54     Pulse Rate 12/04/18 1838 72     Resp 12/04/18 1838 14     Temp 12/04/18 1838 98.1 F (36.7 C)     Temp Source 12/04/18 1838 Oral     SpO2 12/04/18 1838 100 %     Weight --      Height --      Head Circumference --      Peak Flow --      Pain Score 12/04/18 1849 8     Pain Loc --      Pain Edu? --      Excl. in [place]? --     Physical Exam Vitals signs and nursing note reviewed.  Constitutional:      General: She is in acute distress.     Appearance: She is well-developed. She is ill-appearing.  HENT:     Head: Normocephalic and atraumatic.     Nose: Nose normal.     Mouth/Throat:     Mouth: Mucous membranes are moist.     Comments: Mouth stained blue Eyes:     Extraocular Movements: Extraocular movements intact.     Conjunctiva/sclera: Conjunctivae normal.     Pupils: Pupils are equal, round, and reactive to light.  Neck:     Musculoskeletal: Normal range of motion and neck supple.  Cardiovascular:     Rate and Rhythm: Normal rate and regular rhythm.     Pulses: Normal pulses.     Heart sounds: Normal heart sounds. No murmur.  Pulmonary:     Effort: Pulmonary effort is normal. No respiratory distress.     Breath sounds: Normal breath sounds.  Abdominal:     Palpations: Abdomen is soft.     Tenderness: There is abdominal tenderness (epigastric ).  Musculoskeletal:        General: Tenderness (TTP to low spine) present.  Skin:    General: Skin is warm and dry.  Capillary Refill: Capillary refill takes less than 2 seconds.      Comments: Surgical site in lumbar spine is red but no drainage  Neurological:     Mental Status: She is alert.     Comments: Patient with overall normal cranial nerves, normal strength and sensation in her upper extremities, normal sensation in the lower extremities.  Patient is able to move her toes on the right lower leg but otherwise does not move her leg.  Patient appears to have foot drop on the left but does not move her left leg at all, hard to tell that if this is effort dependent from pain  Psychiatric:        Mood and Affect: Mood normal.      ED Treatments / Results  Labs (all labs ordered are listed, but only abnormal results are displayed) Labs Reviewed  CBC WITH DIFFERENTIAL/PLATELET - Abnormal; Notable for the following components:      Result Value   RDW 16.2 (*)    Neutro Abs 8.1 (*)    Lymphs Abs 0.6 (*)    All other components within normal limits  COMPREHENSIVE METABOLIC PANEL - Abnormal; Notable for the following components:   Sodium 133 (*)    Chloride 96 (*)    BUN 32 (*)    Creatinine, Ser 1.33 (*)    Total Protein 6.2 (*)    Albumin 3.4 (*)    GFR calc non Af Amer 39 (*)    GFR calc Af Amer 45 (*)    All other components within normal limits  LIPASE, BLOOD - Abnormal; Notable for the following components:   Lipase 196 (*)    All other components within normal limits  PROTIME-INR - Abnormal; Notable for the following components:   Prothrombin Time 20.4 (*)    INR 1.8 (*)    All other components within normal limits  URINALYSIS, ROUTINE W REFLEX MICROSCOPIC - Abnormal; Notable for the following components:   Hgb urine dipstick SMALL (*)    Nitrite POSITIVE (*)    Leukocytes,Ua LARGE (*)    Bacteria, UA MANY (*)    All other components within normal limits  URINE CULTURE  CULTURE, BLOOD (ROUTINE X 2)  CULTURE, BLOOD (ROUTINE X 2)  SARS CORONAVIRUS 2 (HOSPITAL ORDER, [facility])  CK  LACTIC ACID, PLASMA  LACTIC ACID,  PLASMA    EKG None  Radiology Dg Chest 2 View  Result Date: 12/04/2018 CLINICAL DATA:  Initial evaluation for acute chest pain. EXAM: CHEST - 2 VIEW COMPARISON:  Prior radiograph from 04/21/2016. FINDINGS: Transverse heart size at the upper limits of normal. Mediastinal silhouette within normal limits. Aortic atherosclerosis. Lungs normally inflated. No focal infiltrates. No pulmonary edema or pleural effusion. No pneumothorax. No acute osseous finding. IMPRESSION: 1. No active cardiopulmonary disease. 2. Aortic atherosclerosis. Electronically Signed   By: Jeannine Boga M.D.   On: 12/04/2018 21:27   Dg Pelvis 1-2 Views  Result Date: 12/04/2018 CLINICAL DATA:  Initial evaluation for acute pain. EXAM: PELVIS - 1-2 VIEW COMPARISON:  None. FINDINGS: No acute fracture dislocation. No pubic diastasis. SI joints approximated. Femoral heads in normal alignment within the acetabula. Femoral head heights maintained. Fusion hardware partially visualize within the lumbar spine. Mild-to-moderate osteoarthritic changes about the hips bilaterally. Large amount of retained stool seen within the rectal vault. Soft tissues demonstrate no acute finding. IMPRESSION: 1. No acute osseous abnormality about the pelvis. 2. Large amount of retained stool within  the rectal vault, suggesting constipation. 3. Mild to moderate degenerative osteoarthrosis about the hips bilaterally. Electronically Signed   By: Jeannine Boga M.D.   On: 12/04/2018 21:29   Ct Head Wo Contrast  Result Date: 12/04/2018 CLINICAL DATA:  Head trauma, altered mental status EXAM: CT HEAD WITHOUT CONTRAST CT CERVICAL SPINE WITHOUT CONTRAST TECHNIQUE: Multidetector CT imaging of the head and cervical spine was performed following the standard protocol without intravenous contrast. Multiplanar CT image reconstructions of the cervical spine were also generated. COMPARISON:  10/12/2013 FINDINGS: CT HEAD FINDINGS Brain: No evidence of acute  infarction, hemorrhage, hydrocephalus, extra-axial collection or mass lesion/mass effect. Periventricular white matter hypodensity. Vascular: No hyperdense vessel or unexpected calcification. Skull: Normal. Negative for fracture or focal lesion. Sinuses/Orbits: No acute finding. Other: None. CT CERVICAL SPINE FINDINGS Alignment: Normal. Skull base and vertebrae: No acute fracture. No primary bone lesion or focal pathologic process. Soft tissues and spinal canal: No prevertebral fluid or swelling. No visible canal hematoma. Disc levels: Moderate multilevel disc space height loss and osteophytosis. Upper chest: Negative. Other: None. IMPRESSION: 1. No acute intracranial pathology. Small-vessel white matter disease. 2.  No fracture or static subluxation of the cervical spine. Electronically Signed   By: Eddie Candle M.D.   On: 12/04/2018 20:56   Ct Cervical Spine Wo Contrast  Result Date: 12/04/2018 CLINICAL DATA:  Head trauma, altered mental status EXAM: CT HEAD WITHOUT CONTRAST CT CERVICAL SPINE WITHOUT CONTRAST TECHNIQUE: Multidetector CT imaging of the head and cervical spine was performed following the standard protocol without intravenous contrast. Multiplanar CT image reconstructions of the cervical spine were also generated. COMPARISON:  10/12/2013 FINDINGS: CT HEAD FINDINGS Brain: No evidence of acute infarction, hemorrhage, hydrocephalus, extra-axial collection or mass lesion/mass effect. Periventricular white matter hypodensity. Vascular: No hyperdense vessel or unexpected calcification. Skull: Normal. Negative for fracture or focal lesion. Sinuses/Orbits: No acute finding. Other: None. CT CERVICAL SPINE FINDINGS Alignment: Normal. Skull base and vertebrae: No acute fracture. No primary bone lesion or focal pathologic process. Soft tissues and spinal canal: No prevertebral fluid or swelling. No visible canal hematoma. Disc levels: Moderate multilevel disc space height loss and osteophytosis. Upper chest:  Negative. Other: None. IMPRESSION: 1. No acute intracranial pathology. Small-vessel white matter disease. 2.  No fracture or static subluxation of the cervical spine. Electronically Signed   By: Eddie Candle M.D.   On: 12/04/2018 20:56   Dg Femur Min 2 Views Left  Result Date: 12/04/2018 CLINICAL DATA:  Initial evaluation for acute pain. EXAM: LEFT FEMUR 2 VIEWS COMPARISON:  None. FINDINGS: No acute fracture or dislocation. Osseous mineralization within normal limits. Mild osteoarthritic changes noted about the left hip and knee. Visualized bony pelvis intact. No acute soft tissue abnormality. IMPRESSION: No acute osseous abnormality about the left femur. Electronically Signed   By: Jeannine Boga M.D.   On: 12/04/2018 21:18   Dg Femur Min 2 Views Right  Result Date: 12/04/2018 CLINICAL DATA:  Initial evaluation for acute pain. EXAM: RIGHT FEMUR 2 VIEWS COMPARISON:  None. FINDINGS: No acute fracture or dislocation. Visualized bony pelvis intact. Moderate osteoarthritic changes noted about the right hip and knee. Osseous mineralization normal. No acute soft tissue abnormality. IMPRESSION: 1. No acute osseous abnormality about the right femur. 2. Moderate degenerative osteoarthrosis about the right hip and knee. Electronically Signed   By: Jeannine Boga M.D.   On: 12/04/2018 21:23    Procedures Procedures (including critical care time)  Medications Ordered in ED Medications  fentaNYL (SUBLIMAZE) injection  50 mcg (has no administration in time range)  cefTRIAXone (ROCEPHIN) 1 g in sodium chloride 0.9 % 100 mL IVPB (has no administration in time range)  sodium chloride 0.9 % bolus 1,000 mL (0 mLs Intravenous Stopped 12/04/18 2144)  fentaNYL (SUBLIMAZE) injection 50 mcg (50 mcg Intravenous Given 12/04/18 2338)  0.9 %  sodium chloride infusion ( Intravenous New Bag/Given 12/04/18 2338)     Initial Impression / Assessment and Plan / ED Course  I have reviewed the triage vital signs and  the nursing notes.  Pertinent labs & imaging results that were available during my care of the patient were reviewed by me and considered in my medical decision making (see chart for details).     [name] is a 78 year old female history of Parkinson's disease, status post recent surgery for lumbar epidural abscesses currently on doxycycline who presents to the ED with generalized weakness and pain.  Patient with normal vitals.  No fever.  Overall patient is a poor historian.  She mostly complains of severe pain in both of her thighs.  Her family states that she lives at home with her husband.  Has 24 hours a day home health.  Has not had much to eat or drink over the last day or so.  Has had increased back pain.  Has left lower leg weakness at baseline.  Patient possibly with some abdominal pain today as well.  Has some tenderness in her epigastric region.  Overall her exam is difficult to assess because she is in such severe pain.  She does not move her left lower extremity at all but has normal sensation.  Family does state that she has weakness in the left leg at baseline and on known if this is new or not.  She has good pulses.  Right leg she can move her toes but also unable to move it unknown if this is secondary to pain or true weakness.  She has wet herself on my examination.  Does not appear to have urinary retention.  She did have MRI done 2 days ago that daughter made me aware of that showed worsening osteomyelitis in the T12, L1 region.  Resolved an abscess in that area.  Lower down however there appears to be possibly a new abscess versus an old hematoma with severe stenosis.  Suspect this is the cause for most of her pain.  However patient does not appear septic.  No fever, no white count, no tachycardia.  She has been on doxycycline oral.    Contacted neurosurgery and they will evaluate the patient and review imaging and come up with a plan.  Patient was also found to have a UTI.   Elevation in her lipase.  However gallbladder and liver enzymes normal.  Possibly idiopathic pancreatitis.  Talked with hospitalist and they would like a CT scan of her abdomen and pelvis.  They will follow-up that scan.  We will give IV Rocephin for UTI.  Patient was given normal saline bolus, IV fentanyl.  Blood cultures were collected.  CT scan of head and neck were unremarkable.  Extremity x-rays were normal.  Overall patient admitted for UTI, pancreatitis. INR 1.8, subtherapeutic. Neurosurgery to evaluated the patient given recent MRI findings.  Hospitalist will follow-up neurosurgery recommendations. Will await neurosurgery recommendations before considering re-expanding antibiotics.  This chart was dictated using voice recognition software.  Despite best efforts to proofread,  errors can occur which can change the documentation meaning.    Final Clinical  Impressions(s) / ED Diagnoses   Final diagnoses:  Urinary tract infection without hematuria, site unspecified  Back pain, unspecified back location, unspecified back pain laterality, unspecified chronicity  Acute pancreatitis, unspecified complication status, unspecified pancreatitis type    ED Discharge Orders    None       Lennice Sites, DO 12/05/18 0017    Lennice Sites, DO 12/05/18 [id number]

## 2018-12-04 NOTE — ED Notes (Signed)
Please call daughter Steva Ready for status update @  [phone]

## 2018-12-04 NOTE — ED Triage Notes (Signed)
Pt arrives via EMS with decreased mental status per the family for the last 2 weeks. Pt co decreased appetite, increased pain in her legs and abd. Also generalized weakness all over. Pt had back surgery 6 weeks ago. A7O x4 for EMS.

## 2018-12-05 ENCOUNTER — Ambulatory Visit: Payer: Medicare Other | Admitting: Infectious Diseases

## 2018-12-05 ENCOUNTER — Inpatient Hospital Stay (HOSPITAL_COMMUNITY): Payer: Medicare Other

## 2018-12-05 DIAGNOSIS — M4620 Osteomyelitis of vertebra, site unspecified: Secondary | ICD-10-CM | POA: Diagnosis present

## 2018-12-05 DIAGNOSIS — M4805 Spinal stenosis, thoracolumbar region: Secondary | ICD-10-CM | POA: Diagnosis not present

## 2018-12-05 DIAGNOSIS — G061 Intraspinal abscess and granuloma: Secondary | ICD-10-CM | POA: Diagnosis present

## 2018-12-05 DIAGNOSIS — M1990 Unspecified osteoarthritis, unspecified site: Secondary | ICD-10-CM

## 2018-12-05 DIAGNOSIS — N183 Chronic kidney disease, stage 3 (moderate): Secondary | ICD-10-CM | POA: Diagnosis present

## 2018-12-05 DIAGNOSIS — M1711 Unilateral primary osteoarthritis, right knee: Secondary | ICD-10-CM | POA: Diagnosis present

## 2018-12-05 DIAGNOSIS — G9340 Encephalopathy, unspecified: Secondary | ICD-10-CM | POA: Diagnosis not present

## 2018-12-05 DIAGNOSIS — Z8739 Personal history of other diseases of the musculoskeletal system and connective tissue: Secondary | ICD-10-CM

## 2018-12-05 DIAGNOSIS — R531 Weakness: Secondary | ICD-10-CM | POA: Diagnosis not present

## 2018-12-05 DIAGNOSIS — M160 Bilateral primary osteoarthritis of hip: Secondary | ICD-10-CM | POA: Diagnosis present

## 2018-12-05 DIAGNOSIS — M4625 Osteomyelitis of vertebra, thoracolumbar region: Principal | ICD-10-CM

## 2018-12-05 DIAGNOSIS — I5032 Chronic diastolic (congestive) heart failure: Secondary | ICD-10-CM | POA: Diagnosis present

## 2018-12-05 DIAGNOSIS — M549 Dorsalgia, unspecified: Secondary | ICD-10-CM | POA: Diagnosis not present

## 2018-12-05 DIAGNOSIS — D62 Acute posthemorrhagic anemia: Secondary | ICD-10-CM | POA: Diagnosis not present

## 2018-12-05 DIAGNOSIS — G960 Cerebrospinal fluid leak: Secondary | ICD-10-CM | POA: Diagnosis not present

## 2018-12-05 DIAGNOSIS — I10 Essential (primary) hypertension: Secondary | ICD-10-CM

## 2018-12-05 DIAGNOSIS — E7800 Pure hypercholesterolemia, unspecified: Secondary | ICD-10-CM | POA: Diagnosis present

## 2018-12-05 DIAGNOSIS — R933 Abnormal findings on diagnostic imaging of other parts of digestive tract: Secondary | ICD-10-CM

## 2018-12-05 DIAGNOSIS — F05 Delirium due to known physiological condition: Secondary | ICD-10-CM | POA: Diagnosis present

## 2018-12-05 DIAGNOSIS — N3000 Acute cystitis without hematuria: Secondary | ICD-10-CM | POA: Diagnosis not present

## 2018-12-05 DIAGNOSIS — K219 Gastro-esophageal reflux disease without esophagitis: Secondary | ICD-10-CM | POA: Diagnosis present

## 2018-12-05 DIAGNOSIS — G062 Extradural and subdural abscess, unspecified: Secondary | ICD-10-CM | POA: Diagnosis not present

## 2018-12-05 DIAGNOSIS — I639 Cerebral infarction, unspecified: Secondary | ICD-10-CM | POA: Diagnosis not present

## 2018-12-05 DIAGNOSIS — I480 Paroxysmal atrial fibrillation: Secondary | ICD-10-CM

## 2018-12-05 DIAGNOSIS — Z888 Allergy status to other drugs, medicaments and biological substances status: Secondary | ICD-10-CM

## 2018-12-05 DIAGNOSIS — N179 Acute kidney failure, unspecified: Secondary | ICD-10-CM | POA: Diagnosis present

## 2018-12-05 DIAGNOSIS — M545 Low back pain: Secondary | ICD-10-CM | POA: Diagnosis not present

## 2018-12-05 DIAGNOSIS — G20 Parkinson's disease: Secondary | ICD-10-CM | POA: Diagnosis present

## 2018-12-05 DIAGNOSIS — R410 Disorientation, unspecified: Secondary | ICD-10-CM | POA: Diagnosis not present

## 2018-12-05 DIAGNOSIS — Z20828 Contact with and (suspected) exposure to other viral communicable diseases: Secondary | ICD-10-CM | POA: Diagnosis present

## 2018-12-05 DIAGNOSIS — M4800 Spinal stenosis, site unspecified: Secondary | ICD-10-CM

## 2018-12-05 DIAGNOSIS — K861 Other chronic pancreatitis: Secondary | ICD-10-CM | POA: Diagnosis not present

## 2018-12-05 DIAGNOSIS — E785 Hyperlipidemia, unspecified: Secondary | ICD-10-CM

## 2018-12-05 DIAGNOSIS — K859 Acute pancreatitis without necrosis or infection, unspecified: Secondary | ICD-10-CM

## 2018-12-05 DIAGNOSIS — G8929 Other chronic pain: Secondary | ICD-10-CM | POA: Diagnosis present

## 2018-12-05 DIAGNOSIS — G2581 Restless legs syndrome: Secondary | ICD-10-CM | POA: Diagnosis present

## 2018-12-05 DIAGNOSIS — M4804 Spinal stenosis, thoracic region: Secondary | ICD-10-CM | POA: Diagnosis not present

## 2018-12-05 DIAGNOSIS — G4733 Obstructive sleep apnea (adult) (pediatric): Secondary | ICD-10-CM | POA: Diagnosis present

## 2018-12-05 DIAGNOSIS — Z1159 Encounter for screening for other viral diseases: Secondary | ICD-10-CM | POA: Diagnosis not present

## 2018-12-05 DIAGNOSIS — N10 Acute tubulo-interstitial nephritis: Secondary | ICD-10-CM | POA: Diagnosis not present

## 2018-12-05 DIAGNOSIS — Z7901 Long term (current) use of anticoagulants: Secondary | ICD-10-CM

## 2018-12-05 DIAGNOSIS — I130 Hypertensive heart and chronic kidney disease with heart failure and stage 1 through stage 4 chronic kidney disease, or unspecified chronic kidney disease: Secondary | ICD-10-CM | POA: Diagnosis present

## 2018-12-05 DIAGNOSIS — G9341 Metabolic encephalopathy: Secondary | ICD-10-CM | POA: Diagnosis present

## 2018-12-05 DIAGNOSIS — M4716 Other spondylosis with myelopathy, lumbar region: Secondary | ICD-10-CM | POA: Diagnosis present

## 2018-12-05 LAB — BASIC METABOLIC PANEL
Anion gap: 12 (ref 5–15)
BUN: 29 mg/dL — ABNORMAL HIGH (ref 8–23)
CO2: 20 mmol/L — ABNORMAL LOW (ref 22–32)
Calcium: 9.1 mg/dL (ref 8.9–10.3)
Chloride: 105 mmol/L (ref 98–111)
Creatinine, Ser: 1 mg/dL (ref 0.44–1.00)
GFR calc Af Amer: >60 mL/min (ref >60)
GFR calc non Af Amer: 55 mL/min — ABNORMAL LOW (ref >60)
Glucose, Bld: 74 mg/dL (ref 70–99)
Potassium: 3.9 mmol/L (ref 3.5–5.1)
Sodium: 137 mmol/L (ref 135–145)

## 2018-12-05 LAB — SARS CORONAVIRUS 2 BY RT PCR (HOSPITAL ORDER, PERFORMED IN ~~LOC~~ HOSPITAL LAB): SARS Coronavirus 2: NEGATIVE

## 2018-12-05 LAB — LACTIC ACID, PLASMA: Lactic Acid, Venous: 1.6 mmol/L (ref 0.5–1.9)

## 2018-12-05 MED ORDER — SODIUM CHLORIDE 0.9 % IV SOLN
1.0000 g | INTRAVENOUS | Status: DC
  Filled 2018-12-05: qty 10

## 2018-12-05 MED ORDER — PRAMIPEXOLE DIHYDROCHLORIDE 1 MG PO TABS
1.0000 mg | ORAL_TABLET | Freq: Every day | ORAL | Status: DC
  Administered 2018-12-05 – 2018-12-18 (×13): 1 mg via ORAL
  Filled 2018-12-05 (×15): qty 1

## 2018-12-05 MED ORDER — FLECAINIDE ACETATE 50 MG PO TABS
50.0000 mg | ORAL_TABLET | Freq: Two times a day (BID) | ORAL | Status: DC
  Administered 2018-12-05 – 2018-12-19 (×27): 50 mg via ORAL
  Filled 2018-12-05 (×30): qty 1

## 2018-12-05 MED ORDER — ORAL CARE MOUTH RINSE
15.0000 mL | Freq: Two times a day (BID) | OROMUCOSAL | Status: DC
  Administered 2018-12-05 – 2018-12-19 (×27): 15 mL via OROMUCOSAL

## 2018-12-05 MED ORDER — ACETAMINOPHEN 650 MG RE SUPP: 650.0000 mg | Freq: Four times a day (QID) | RECTAL | Status: DC | PRN

## 2018-12-05 MED ORDER — SODIUM CHLORIDE 0.9 % IV SOLN
1.0000 g | INTRAVENOUS | Status: DC
  Administered 2018-12-06 – 2018-12-07 (×2): 1 g via INTRAVENOUS
  Filled 2018-12-05 (×2): qty 1
  Filled 2018-12-05: qty 10

## 2018-12-05 MED ORDER — POLYETHYLENE GLYCOL 3350 17 G PO PACK
17.0000 g | PACK | Freq: Every day | ORAL | Status: DC
  Administered 2018-12-05 – 2018-12-17 (×6): 17 g via ORAL
  Filled 2018-12-05 (×9): qty 1

## 2018-12-05 MED ORDER — METOPROLOL SUCCINATE ER 100 MG PO TB24
100.0000 mg | ORAL_TABLET | Freq: Every day | ORAL | Status: DC
  Administered 2018-12-05 – 2018-12-19 (×12): 100 mg via ORAL
  Filled 2018-12-05 (×14): qty 1

## 2018-12-05 MED ORDER — PRAVASTATIN SODIUM 40 MG PO TABS
40.0000 mg | ORAL_TABLET | Freq: Every day | ORAL | Status: DC
  Administered 2018-12-05 – 2018-12-18 (×13): 40 mg via ORAL
  Filled 2018-12-05 (×13): qty 1

## 2018-12-05 MED ORDER — PANTOPRAZOLE SODIUM 40 MG PO TBEC
40.0000 mg | DELAYED_RELEASE_TABLET | Freq: Two times a day (BID) | ORAL | Status: DC
  Administered 2018-12-05 – 2018-12-19 (×27): 40 mg via ORAL
  Filled 2018-12-05 (×28): qty 1

## 2018-12-05 MED ORDER — BISACODYL 10 MG RE SUPP
10.0000 mg | Freq: Every day | RECTAL | Status: DC
  Administered 2018-12-06 – 2018-12-07 (×2): 10 mg via RECTAL
  Filled 2018-12-05 (×5): qty 1

## 2018-12-05 MED ORDER — SODIUM CHLORIDE 0.9 % IV SOLN
INTRAVENOUS | Status: AC
  Administered 2018-12-05: 02:00:00 via INTRAVENOUS

## 2018-12-05 MED ORDER — HYDROCODONE-ACETAMINOPHEN 5-325 MG PO TABS
1.0000 | ORAL_TABLET | ORAL | Status: DC | PRN
  Administered 2018-12-05 (×3): 2 via ORAL
  Administered 2018-12-05: 1 via ORAL
  Administered 2018-12-06 – 2018-12-07 (×4): 2 via ORAL
  Administered 2018-12-07: 23:00:00 1 via ORAL
  Administered 2018-12-08: 2 via ORAL
  Administered 2018-12-08: 1 via ORAL
  Administered 2018-12-08 – 2018-12-12 (×14): 2 via ORAL
  Administered 2018-12-12: 1 via ORAL
  Administered 2018-12-12: 2 via ORAL
  Administered 2018-12-12: 1 via ORAL
  Administered 2018-12-13: 2 via ORAL
  Administered 2018-12-14 – 2018-12-16 (×2): 1 via ORAL
  Filled 2018-12-05 (×6): qty 2
  Filled 2018-12-05: qty 1
  Filled 2018-12-05 (×3): qty 2
  Filled 2018-12-05: qty 1
  Filled 2018-12-05 (×3): qty 2
  Filled 2018-12-05 (×2): qty 1
  Filled 2018-12-05 (×8): qty 2
  Filled 2018-12-05: qty 1
  Filled 2018-12-05 (×6): qty 2

## 2018-12-05 MED ORDER — GABAPENTIN 600 MG PO TABS
600.0000 mg | ORAL_TABLET | Freq: Two times a day (BID) | ORAL | Status: DC
  Administered 2018-12-05 – 2018-12-19 (×28): 600 mg via ORAL
  Filled 2018-12-05 (×28): qty 1

## 2018-12-05 MED ORDER — METHOCARBAMOL 500 MG PO TABS
250.0000 mg | ORAL_TABLET | Freq: Two times a day (BID) | ORAL | Status: DC
  Administered 2018-12-05 – 2018-12-08 (×8): 250 mg via ORAL
  Filled 2018-12-05 (×8): qty 1

## 2018-12-05 MED ORDER — FUROSEMIDE 20 MG PO TABS: 20.0000 mg | ORAL_TABLET | Freq: Every day | ORAL | Status: DC

## 2018-12-05 MED ORDER — CARBIDOPA-LEVODOPA 25-100 MG PO TABS
2.0000 | ORAL_TABLET | Freq: Three times a day (TID) | ORAL | Status: DC
  Administered 2018-12-05 – 2018-12-14 (×26): 2 via ORAL
  Filled 2018-12-05 (×33): qty 2

## 2018-12-05 MED ORDER — ACETAMINOPHEN 325 MG PO TABS
650.0000 mg | ORAL_TABLET | Freq: Four times a day (QID) | ORAL | Status: DC | PRN
  Administered 2018-12-08 – 2018-12-11 (×5): 650 mg via ORAL
  Filled 2018-12-05 (×5): qty 2

## 2018-12-05 MED ORDER — SODIUM CHLORIDE 0.9 % IV SOLN
1.0000 g | Freq: Once | INTRAVENOUS | Status: AC
  Administered 2018-12-05: 1 g via INTRAVENOUS
  Filled 2018-12-05: qty 10

## 2018-12-05 NOTE — ED Notes (Addendum)
ED TO INPATIENT HANDOFF REPORT  ED Nurse Name and Phone #:  [phone]  S Name/Age/Gender [name] 78 y.o. female Room/Bed: 021C/021C  Code Status   Code Status: Prior  Home/SNF/Other Home Patient oriented to: self, place, time  Is this baseline? Yes   Triage Complete: Triage complete  Chief Complaint Post Op problem,Fever    Triage Note Pt arrives via EMS with decreased mental status per the family for the last 2 weeks. Pt co decreased appetite, increased pain in her legs and abd. Also generalized weakness all over. Pt had back surgery 6 weeks ago. A7O x4 for EMS.   Allergies Allergies  Allergen Reactions  . Lyrica [Pregabalin] Other (See Comments)    Felt loopy  . Zocor [Simvastatin] Other (See Comments)    Myalgias  . Relafen [Nabumetone] Rash    Level of Care/Admitting Diagnosis ED Disposition    ED Disposition Condition [facility]: [place] [[id number]]  Level of Care: Med-Surg [16]  Covid Evaluation: Screening Protocol (No Symptoms)  Diagnosis: Vertebral osteomyelitis [facility]) [[id number]]  Admitting Physician: Shela Leff [[id number]]  Attending Physician: Shela Leff [[id number]]  Estimated length of stay: past midnight tomorrow  Certification:: I certify this patient will need inpatient services for at least 2 midnights  PT Class (Do Not Modify): Inpatient [101]  PT Acc Code (Do Not Modify): Private [1]       B Medical/Surgery History Past Medical History:  Diagnosis Date  . Allergic rhinitis   . Arthritis    "all over"  . CHF (congestive heart failure) ([place])   . Chronic back pain   . DJD (degenerative joint disease)   . GERD (gastroesophageal reflux disease)   . Hyperlipidemia   . Hypertension   . Lumbar stenosis   . OSA (obstructive sleep apnea)    "couldn't handle CPAP; use mouth guard some; not all the time" (01/06/2014)  . PAF (paroxysmal atrial fibrillation) ([place])   . Scoliosis    with  radiculopathy L2-S1 with prior surgery  . Small bowel obstruction ([facility])    versus ileus after last bck surgery  . Spondylosis    Past Surgical History:  Procedure Laterality Date  . BACK SURGERY    . CATARACT EXTRACTION W/ INTRAOCULAR LENS  IMPLANT, BILATERAL  2011  . CHOLECYSTECTOMY N/A 07/07/2018   Procedure: LAPAROSCOPIC CHOLECYSTECTOMY;  Surgeon: Erroll Luna, MD;  Location: [place];  Service: General;  Laterality: N/A;  . COLONOSCOPY  12/09/2004  . LIPOMA EXCISION  1980's   "fatty tumors"  . [place] SURGERY  02/2009   "ruptured disc"  . NASAL SEPTUM SURGERY  80's  . POSTERIOR LUMBAR FUSION  06/2010; 10/2011   "placed screws, rods, spacers both times"  . REPAIR DURAL / CSF LEAK  02/2009  . THORACIC LAMINECTOMY FOR EPIDURAL ABSCESS N/A 09/28/2018   Procedure: THORACIC NINE-THORACIC TEN, THORACIC TEN-THORACIC ELEVEN, THORACIC ELEVEN-THORACIC TWELVE LAMINECTOMIES FOR EPIDURAL ABSCESS;  Surgeon: Judith Part, MD;  Location: [facility];  Service: Neurosurgery;  Laterality: N/A;  . TOTAL ABDOMINAL HYSTERECTOMY  02/1993     A IV Location/Drains/Wounds Patient Lines/Drains/Airways Status   Active Line/Drains/Airways    Name:   Placement date:   Placement time:   Site:   Days:   Peripheral IV 12/04/18 Left Antecubital   12/04/18    2012    Antecubital   1   PICC Single Lumen 62/95/28 PICC Right Basilic 38 cm 1 cm   41/32/44  0102    Basilic   63   External Urinary Catheter   09/30/18    0857    -   66   Incision 10/12/11 Back Bilateral   10/12/11    0857     2611   Incision (Closed) 07/07/18 Abdomen Other (Comment)   07/07/18    1021     151   Incision (Closed) 09/28/18 Back   09/28/18    1322     68   Incision - 4 Ports Abdomen Umbilicus Mid;Upper Right;Upper;Lateral Right;Lower;Lateral   07/07/18    1019     151          Intake/Output Last 24 hours No intake or output data in the 24 hours ending 12/05/18 0036  Labs/Imaging Results for orders placed or performed during the  hospital encounter of 12/04/18 (from the past 48 hour(s))  CBC with Differential     Status: Abnormal   Collection Time: 12/04/18  7:40 PM  Result Value Ref Range   WBC 9.5 4.0 - 10.5 K/uL   RBC 4.53 3.87 - 5.11 MIL/uL   Hemoglobin 12.1 12.0 - 15.0 g/dL   HCT 39.1 36.0 - 46.0 %   MCV 86.3 80.0 - 100.0 fL   MCH 26.7 26.0 - 34.0 pg   MCHC 30.9 30.0 - 36.0 g/dL   RDW 16.2 (H) 11.5 - 15.5 %   Platelets 181 150 - 400 K/uL   nRBC 0.0 0.0 - 0.2 %   Neutrophils Relative % 85 %   Neutro Abs 8.1 (H) 1.7 - 7.7 K/uL   Lymphocytes Relative 6 %   Lymphs Abs 0.6 (L) 0.7 - 4.0 K/uL   Monocytes Relative 6 %   Monocytes Absolute 0.6 0.1 - 1.0 K/uL   Eosinophils Relative 2 %   Eosinophils Absolute 0.2 0.0 - 0.5 K/uL   Basophils Relative 0 %   Basophils Absolute 0.0 0.0 - 0.1 K/uL   Immature Granulocytes 1 %   Abs Immature Granulocytes 0.05 0.00 - 0.07 K/uL    Comment: Performed at [facility], [address], [place], [place] [address]  Comprehensive metabolic panel     Status: Abnormal   Collection Time: 12/04/18  7:40 PM  Result Value Ref Range   Sodium 133 (L) 135 - 145 mmol/L   Potassium 3.8 3.5 - 5.1 mmol/L   Chloride 96 (L) 98 - 111 mmol/L   CO2 26 22 - 32 mmol/L   Glucose, Bld 94 70 - 99 mg/dL   BUN 32 (H) 8 - 23 mg/dL   Creatinine, Ser 1.33 (H) 0.44 - 1.00 mg/dL   Calcium 9.5 8.9 - 10.3 mg/dL   Total Protein 6.2 (L) 6.5 - 8.1 g/dL   Albumin 3.4 (L) 3.5 - 5.0 g/dL   AST 16 15 - 41 U/L   ALT 10 0 - 44 U/L   Alkaline Phosphatase 97 38 - 126 U/L   Total Bilirubin 0.8 0.3 - 1.2 mg/dL   GFR calc non Af Amer 39 (L) >60 mL/min   GFR calc Af Amer 45 (L) >60 mL/min   Anion gap 11 5 - 15    Comment: Performed at [facility], [place] [address] [place], [place] [address]  Lipase, blood     Status: Abnormal   Collection Time: 12/04/18  7:40 PM  Result Value Ref Range   Lipase 196 (H) 11 - 51 U/L    Comment: Performed at [facility], [address], [place], [place]  [address]  Protime-INR     Status: Abnormal   Collection Time: 12/04/18  7:40 PM  Result Value Ref Range   Prothrombin Time 20.4 (H) 11.4 - 15.2 seconds   INR 1.8 (H) 0.8 - 1.2    Comment: (NOTE) INR goal varies based on device and disease states. Performed at [facility], [place] [address], [place], [place] [address]   CK     Status: None   Collection Time: 12/04/18  7:40 PM  Result Value Ref Range   Total CK 56 38 - 234 U/L    Comment: Performed at [facility], [place] [address], [place], [place] [address]  Urinalysis, Routine w reflex microscopic     Status: Abnormal   Collection Time: 12/04/18 11:41 PM  Result Value Ref Range   Color, Urine YELLOW YELLOW   APPearance CLEAR CLEAR   Specific Gravity, Urine 1.018 1.005 - 1.030   pH 5.0 5.0 - 8.0   Glucose, UA NEGATIVE NEGATIVE mg/dL   Hgb urine dipstick SMALL (A) NEGATIVE   Bilirubin Urine NEGATIVE NEGATIVE   Ketones, ur NEGATIVE NEGATIVE mg/dL   Protein, ur NEGATIVE NEGATIVE mg/dL   Nitrite POSITIVE (A) NEGATIVE   Leukocytes,Ua LARGE (A) NEGATIVE   RBC / HPF 0-5 0 - 5 RBC/hpf   WBC, UA 21-50 0 - 5 WBC/hpf   Bacteria, UA MANY (A) NONE SEEN   Squamous Epithelial / LPF 0-5 0 - 5   Budding Yeast PRESENT    Hyaline Casts, UA PRESENT    Ca Oxalate Crys, UA PRESENT     Comment: Performed at [facility], [address] [place], [place] [address]   Dg Chest 2 View  Result Date: 12/04/2018 CLINICAL DATA:  Initial evaluation for acute chest pain. EXAM: CHEST - 2 VIEW COMPARISON:  Prior radiograph from 04/21/2016. FINDINGS: Transverse heart size at the upper limits of normal. Mediastinal silhouette within normal limits. Aortic atherosclerosis. Lungs normally inflated. No focal infiltrates. No pulmonary edema or pleural effusion. No pneumothorax. No acute osseous finding. IMPRESSION: 1. No active cardiopulmonary disease. 2. Aortic atherosclerosis. Electronically Signed   By: Jeannine Boga M.D.   On: 12/04/2018 21:27    Dg Pelvis 1-2 Views  Result Date: 12/04/2018 CLINICAL DATA:  Initial evaluation for acute pain. EXAM: PELVIS - 1-2 VIEW COMPARISON:  None. FINDINGS: No acute fracture dislocation. No pubic diastasis. SI joints approximated. Femoral heads in normal alignment within the acetabula. Femoral head heights maintained. Fusion hardware partially visualize within the lumbar spine. Mild-to-moderate osteoarthritic changes about the hips bilaterally. Large amount of retained stool seen within the rectal vault. Soft tissues demonstrate no acute finding. IMPRESSION: 1. No acute osseous abnormality about the pelvis. 2. Large amount of retained stool within the rectal vault, suggesting constipation. 3. Mild to moderate degenerative osteoarthrosis about the hips bilaterally. Electronically Signed   By: Jeannine Boga M.D.   On: 12/04/2018 21:29   Ct Head Wo Contrast  Result Date: 12/04/2018 CLINICAL DATA:  Head trauma, altered mental status EXAM: CT HEAD WITHOUT CONTRAST CT CERVICAL SPINE WITHOUT CONTRAST TECHNIQUE: Multidetector CT imaging of the head and cervical spine was performed following the standard protocol without intravenous contrast. Multiplanar CT image reconstructions of the cervical spine were also generated. COMPARISON:  10/12/2013 FINDINGS: CT HEAD FINDINGS Brain: No evidence of acute infarction, hemorrhage, hydrocephalus, extra-axial collection or mass lesion/mass effect. Periventricular white matter hypodensity. Vascular: No hyperdense vessel or unexpected calcification. Skull: Normal.  Negative for fracture or focal lesion. Sinuses/Orbits: No acute finding. Other: None. CT CERVICAL SPINE FINDINGS Alignment: Normal. Skull base and vertebrae: No acute fracture. No primary bone lesion or focal pathologic process. Soft tissues and spinal canal: No prevertebral fluid or swelling. No visible canal hematoma. Disc levels: Moderate multilevel disc space height loss and osteophytosis. Upper chest: Negative.  Other: None. IMPRESSION: 1. No acute intracranial pathology. Small-vessel white matter disease. 2.  No fracture or static subluxation of the cervical spine. Electronically Signed   By: Eddie Candle M.D.   On: 12/04/2018 20:56   Ct Cervical Spine Wo Contrast  Result Date: 12/04/2018 CLINICAL DATA:  Head trauma, altered mental status EXAM: CT HEAD WITHOUT CONTRAST CT CERVICAL SPINE WITHOUT CONTRAST TECHNIQUE: Multidetector CT imaging of the head and cervical spine was performed following the standard protocol without intravenous contrast. Multiplanar CT image reconstructions of the cervical spine were also generated. COMPARISON:  10/12/2013 FINDINGS: CT HEAD FINDINGS Brain: No evidence of acute infarction, hemorrhage, hydrocephalus, extra-axial collection or mass lesion/mass effect. Periventricular white matter hypodensity. Vascular: No hyperdense vessel or unexpected calcification. Skull: Normal. Negative for fracture or focal lesion. Sinuses/Orbits: No acute finding. Other: None. CT CERVICAL SPINE FINDINGS Alignment: Normal. Skull base and vertebrae: No acute fracture. No primary bone lesion or focal pathologic process. Soft tissues and spinal canal: No prevertebral fluid or swelling. No visible canal hematoma. Disc levels: Moderate multilevel disc space height loss and osteophytosis. Upper chest: Negative. Other: None. IMPRESSION: 1. No acute intracranial pathology. Small-vessel white matter disease. 2.  No fracture or static subluxation of the cervical spine. Electronically Signed   By: Eddie Candle M.D.   On: 12/04/2018 20:56   Dg Femur Min 2 Views Left  Result Date: 12/04/2018 CLINICAL DATA:  Initial evaluation for acute pain. EXAM: LEFT FEMUR 2 VIEWS COMPARISON:  None. FINDINGS: No acute fracture or dislocation. Osseous mineralization within normal limits. Mild osteoarthritic changes noted about the left hip and knee. Visualized bony pelvis intact. No acute soft tissue abnormality. IMPRESSION: No acute  osseous abnormality about the left femur. Electronically Signed   By: Jeannine Boga M.D.   On: 12/04/2018 21:18   Dg Femur Min 2 Views Right  Result Date: 12/04/2018 CLINICAL DATA:  Initial evaluation for acute pain. EXAM: RIGHT FEMUR 2 VIEWS COMPARISON:  None. FINDINGS: No acute fracture or dislocation. Visualized bony pelvis intact. Moderate osteoarthritic changes noted about the right hip and knee. Osseous mineralization normal. No acute soft tissue abnormality. IMPRESSION: 1. No acute osseous abnormality about the right femur. 2. Moderate degenerative osteoarthrosis about the right hip and knee. Electronically Signed   By: Jeannine Boga M.D.   On: 12/04/2018 21:23    Pending Labs Unresulted Labs (From admission, onward)    Start     Ordered   12/04/18 2350  SARS Coronavirus 2 (CEPHEID - Performed in [place] hospital lab), [place]  (Asymptomatic Patients Labs)  Once,   STAT    Question:  Rule Out  Answer:  Yes   12/04/18 2349   12/04/18 2236  Lactic acid, plasma  Now then every 2 hours,   STAT     12/04/18 2237   12/04/18 1940  Blood culture (routine x 2)  BLOOD CULTURE X 2,   STAT     12/04/18 1939   12/04/18 1920  Urine culture  ONCE - STAT,   STAT     12/04/18 1919          Vitals/Pain Today's Vitals  12/04/18 2215 12/04/18 2219 12/04/18 2310 12/04/18 2315  BP: (!) 147/117 (!) 147/117 (!) 120/110 111/77  Pulse: 93 76 71 71  Resp: 15 (!) 27 12 16   Temp:      TempSrc:      SpO2: 100%   98%  PainSc:        Isolation Precautions No active isolations  Medications Medications  fentaNYL (SUBLIMAZE) injection 50 mcg (has no administration in time range)  cefTRIAXone (ROCEPHIN) 1 g in sodium chloride 0.9 % 100 mL IVPB (has no administration in time range)  sodium chloride 0.9 % bolus 1,000 mL (0 mLs Intravenous Stopped 12/04/18 2144)  fentaNYL (SUBLIMAZE) injection 50 mcg (50 mcg Intravenous Given 12/04/18 2338)  0.9 %  sodium chloride infusion (  Intravenous New Bag/Given 12/04/18 2338)    Mobility non-ambulatory Low fall risk   Focused Assessments Neuro Assessment Handoff:  Swallow screen pass? N/a Cardiac Rhythm: Normal sinus rhythm       Neuro Assessment: Exceptions to WDL Neuro Checks:      Last Documented NIHSS Modified Score:   Has TPA been given? No If patient is a Neuro Trauma and patient is going to OR before floor call report to [facility] nurse: [phone] or [phone]     R Recommendations: See Admitting Provider Note  Report given to:   Additional Notes:

## 2018-12-05 NOTE — H&P (Signed)
History and Physical    NATALIYAH PACKHAM OVF:[id number] DOB: 09-02-1940 DOA: 12/04/2018  PCP: [name], MD Patient coming from: Home  Chief Complaint: Pain all over  HPI: LAYZA SUMMA is a 78 y.o. female with medical history significant of arthritis, DJD, CHF, hypertension, hyperlipidemia, OSA, paroxysmal atrial fibrillation, Patient is a very poor historian and it was difficult to obtain a thorough history from her.  Patient reports having pain all over her body.  She is not sure for how long.  Does state that she has pain in her lower back and bilateral thighs.  She is not sure if she is having any saddle anesthesia or urinary/fecal incontinence.  States she is ambulating at home on her own with the help of a walker.  Denies any fevers, chills, chest pain, shortness of breath, cough, abdominal pain, nausea, or vomiting.  States sometimes in the past she had diarrhea and sometimes constipation, she is not sure if she is having diarrhea or constipation at present.  She is not able to tell me if she is currently taking doxycycline.  No additional history could be obtained from her.  ED Course: Afebrile.  Not tachycardic, tachypneic, or hypotensive.  Not hypoxic.  No leukocytosis.  Lactic acid normal.  INR 1.8.  BUN 32.  Creatinine 1.3, baseline 0.7-0.8.  Lipase 196.  LFTs normal.  CK normal.  UA with positive nitrite, large amount of leukocytes, 21-50 WBCs, and many bacteria.  Urine culture pending.  Blood culture x2 pending.  COVID-19 rapid test pending. Chest x-ray showing no active cardiopulmonary disease. CT head negative for acute finding. CT C-spine negative for acute finding. X-ray of left femur showing no acute osseous abnormality. X-ray of right femur showing no acute osseous abnormality.  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 X-ray of pelvis showing no acute osseous abnormality.  Mild to moderate degenerative osteoarthritis of the hips  bilaterally. CT abdomen pelvis showing mild peripancreatic fat stranding concerning for developing pancreatitis.  Large amount of stool in the rectum with presacral free fluid and rectal wall thickening concerning for developing stercoral colitis. MRI of lumbar spine done December 02, 2018 showing T12-L1 discitis-osteomyelitis with progressive disc space collapse and mildly progressive endplate erosion.  Ventral epidural phlegmon/abscess in the lower thoracic spine appears to have largely resolved, though incompletely evaluated on lumbar MRI.  Increased size of a large right-sided disc extrusion behind the T12 vertebral body with increased severe spinal stenosis.  21 x 6 mm dorsal epidural fluid collection at T12/L1 contributes and may represent abscess or old hematoma. Nursing staff reported that patient has been voiding normally in the ED. No acute urinary retention. Patient received 1 L IV fluid bolus, ceftriaxone, and fentanyl in the ED.  Review of Systems:  All systems reviewed and apart from history of presenting illness, are negative.  Past Medical History:  Diagnosis Date   Allergic rhinitis    Arthritis    "all over"   CHF (congestive heart failure) (HCC)    Chronic back pain    DJD (degenerative joint disease)    GERD (gastroesophageal reflux disease)    Hyperlipidemia    Hypertension    Lumbar stenosis    OSA (obstructive sleep apnea)    "couldn't handle CPAP; use mouth guard some; not all the time" (01/06/2014)   PAF (paroxysmal atrial fibrillation) (HCC)    Scoliosis    with radiculopathy L2-S1 with prior surgery   Small bowel obstruction ([place])    versus  ileus after last bck surgery   Spondylosis     Past Surgical History:  Procedure Laterality Date   BACK SURGERY     CATARACT EXTRACTION W/ INTRAOCULAR LENS  IMPLANT, BILATERAL  2011   CHOLECYSTECTOMY N/A 07/07/2018   Procedure: LAPAROSCOPIC CHOLECYSTECTOMY;  Surgeon: Erroll Luna, MD;  Location: [facility];   Service: General;  Laterality: N/A;   COLONOSCOPY  12/09/2004   LIPOMA EXCISION  1980's   "fatty tumors"   [place]  02/2009   "ruptured disc"   NASAL SEPTUM SURGERY  80's   POSTERIOR LUMBAR FUSION  06/2010; 10/2011   "placed screws, rods, spacers both times"   REPAIR DURAL / CSF LEAK  02/2009   THORACIC LAMINECTOMY FOR EPIDURAL ABSCESS N/A 09/28/2018   Procedure: THORACIC NINE-THORACIC TEN, THORACIC TEN-THORACIC ELEVEN, THORACIC ELEVEN-THORACIC TWELVE LAMINECTOMIES FOR EPIDURAL ABSCESS;  Surgeon: Judith Part, MD;  Location: [facility];  Service: Neurosurgery;  Laterality: N/A;   TOTAL ABDOMINAL HYSTERECTOMY  02/1993     reports that she has never smoked. She has never used smokeless tobacco. She reports that she does not drink alcohol or use drugs.  Allergies  Allergen Reactions   Lyrica [Pregabalin] Other (See Comments)    Felt loopy   Zocor [Simvastatin] Other (See Comments)    Myalgias   Relafen [Nabumetone] Rash    Family History  Problem Relation Age of Onset   Arthritis Mother    Heart attack Father    Hypertension Sister    Lung cancer Sister    Asthma Sister    Allergies Sister     Prior to Admission medications   Medication Sig Start Date End Date Taking? Authorizing Provider  acetaminophen (TYLENOL) 325 MG tablet Take 325 mg by mouth daily as needed for mild pain.     [provider]  calcium carbonate (TUMS - DOSED IN MG ELEMENTAL CALCIUM) 500 MG chewable tablet Chew 1 tablet by mouth 2 (two) times daily as needed for indigestion or heartburn.     [provider]  carbidopa-levodopa (SINEMET IR) 25-100 MG tablet Take 2 tablets by mouth 3 (three) times daily. 07/09/18   Raiford Noble Latif, DO  doxycycline (VIBRA-TABS) 100 MG tablet Take 1 tablet (100 mg total) by mouth 2 (two) times daily. 11/22/18   Powers, Evern Core, MD  flecainide (TAMBOCOR) 50 MG tablet TAKE 1 TABLET BY MOUTH 2 TIMES DAILY 02/27/18   Josue Hector, MD    furosemide (LASIX) 20 MG tablet Take 20 mg by mouth daily.     [provider]  gabapentin (NEURONTIN) 600 MG tablet Take 600 mg by mouth 2 (two) times daily.    [provider]  methocarbamol (ROBAXIN) 500 MG tablet Take 250 mg by mouth 2 (two) times a day. 0.5 tablet to = 250 mg for muscle spasms    [provider]  metoprolol succinate (TOPROL-XL) 100 MG 24 hr tablet TAKE 1 TABLET BY MOUTH DAILY 01/30/18   Josue Hector, MD  pantoprazole (PROTONIX) 40 MG tablet Take 40 mg by mouth 2 (two) times daily.     [provider]  polyethylene glycol (MIRALAX / GLYCOLAX) packet Take 17 g by mouth daily. 07/10/18   Raiford Noble Latif, DO  potassium chloride SA (K-DUR,KLOR-CON) 20 MEQ tablet Take 40 mEq by mouth daily.     [provider]  pramipexole (MIRAPEX) 1 MG tablet Take 1 mg by mouth at bedtime.    [provider]  pravastatin (PRAVACHOL) 40  MG tablet Take 40 mg by mouth at bedtime.     [provider]  saccharomyces boulardii (FLORASTOR) 250 MG capsule Take 250 mg by mouth daily.    [provider]  traMADol (ULTRAM) 50 MG tablet Take 2 tablets (100 mg total) by mouth 3 (three) times daily. At [facility], 2p, and 10p 10/16/18   Hennie Duos, MD  TRAVATAN Z 0.004 % SOLN ophthalmic solution Place 1 drop into both eyes at bedtime.  01/21/15   [provider]  vancomycin (VANCOCIN) 750 MG SOLR injection Inject 750 mg into the vein every 12 (twelve) hours. In 250 mg NS until 11/23/2018 - have pharmacy to dose    [provider]  warfarin (COUMADIN) 2 MG tablet Take 2 mg by mouth daily.    [provider]    Physical Exam: Vitals:   12/04/18 2315 12/05/18 0136 12/05/18 0154 12/05/18 0759  BP: 111/77  (!) 154/65 (!) 97/59  Pulse: 71 71 71 86  Resp: 16 (!) 9 11 (!) 22  Temp:  97.9 F (36.6 C) 97.9 F (36.6 C) (!) 97.4 F (36.3 C)  TempSrc:  Oral Oral Oral  SpO2: 98% 100% 100% 98%    Physical Exam   Constitutional: She appears well-developed and well-nourished. No distress.  HENT:  Head: Normocephalic.  Dry mucous membranes  Eyes: Right eye exhibits no discharge. Left eye exhibits no discharge.  Neck: Neck supple.  Cardiovascular: Normal rate, regular rhythm and intact distal pulses.  Pulmonary/Chest: Effort normal and breath sounds normal. No respiratory distress. She has no wheezes. She has no rales.  Abdominal: Soft. Bowel sounds are normal. She exhibits no distension. There is no abdominal tenderness. There is no guarding.  Musculoskeletal:        General: Tenderness present. No edema.     Comments: Lumbar spine tender to palpation  Neurological:  Awake and alert Oriented x3 Neurologic exam limited due to lack of patient cooperation.  Unable to assess strength and sensation in lower extremities.  Skin: Skin is warm and dry. She is not diaphoretic.     Labs on Admission: I have personally reviewed following labs and imaging studies  CBC: Recent Labs  Lab 12/04/18 1940  WBC 9.5  NEUTROABS 8.1*  HGB 12.1  HCT 39.1  MCV 86.3  PLT 778   Basic Metabolic Panel: Recent Labs  Lab 12/04/18 1940  NA 133*  K 3.8  CL 96*  CO2 26  GLUCOSE 94  BUN 32*  CREATININE 1.33*  CALCIUM 9.5   GFR: Estimated Creatinine Clearance: 37.2 mL/min (A) (by C-G formula based on SCr of 1.33 mg/dL (H)). Liver Function Tests: Recent Labs  Lab 12/04/18 1940  AST 16  ALT 10  ALKPHOS 97  BILITOT 0.8  PROT 6.2*  ALBUMIN 3.4*   Recent Labs  Lab 12/04/18 1940  LIPASE 196*   No results for input(s): AMMONIA in the last 168 hours. Coagulation Profile: Recent Labs  Lab 12/04/18 1940  INR 1.8*   Cardiac Enzymes: Recent Labs  Lab 12/04/18 1940  CKTOTAL 56   BNP (last 3 results) No results for input(s): PROBNP in the last 8760 hours. HbA1C: No results for input(s): HGBA1C in the last 72 hours. CBG: No results for input(s): GLUCAP in the last 168 hours. Lipid Profile: No  results for input(s): CHOL, HDL, LDLCALC, TRIG, CHOLHDL, LDLDIRECT in the last 72 hours. Thyroid Function Tests: No results for input(s): TSH, T4TOTAL, FREET4, T3FREE, THYROIDAB in the last 72 hours.  Anemia Panel: No results for input(s): VITAMINB12, FOLATE, FERRITIN, TIBC, IRON, RETICCTPCT in the last 72 hours. Urine analysis:    Component Value Date/Time   COLORURINE YELLOW 12/04/2018 [place] 12/04/2018 2341   LABSPEC 1.018 12/04/2018 2341   PHURINE 5.0 12/04/2018 2341   GLUCOSEU NEGATIVE 12/04/2018 2341   HGBUR SMALL (A) 12/04/2018 2341   BILIRUBINUR NEGATIVE 12/04/2018 2341   KETONESUR NEGATIVE 12/04/2018 2341   PROTEINUR NEGATIVE 12/04/2018 2341   UROBILINOGEN 1.0 06/26/2010 1009   NITRITE POSITIVE (A) 12/04/2018 2341   LEUKOCYTESUR LARGE (A) 12/04/2018 2341    Radiological Exams on Admission: Ct Abdomen Pelvis Wo Contrast  Result Date: 12/05/2018 CLINICAL DATA:  Decreased appetite in generalized weakness. Back surgery 8 weeks ago. EXAM: CT ABDOMEN AND PELVIS WITHOUT CONTRAST TECHNIQUE: Multidetector CT imaging of the abdomen and pelvis was performed following the standard protocol without IV contrast. COMPARISON:  CT dated July 01, 2018 FINDINGS: Lower chest: There is atelectasis at the left lung base.The heart size is normal. Hepatobiliary: The liver is normal. Status post cholecystectomy.There is no biliary ductal dilation. Pancreas: There is mild haziness about the pancreas. There is no significant peripancreatic drainable fluid collection. Spleen: No splenic laceration or hematoma. Adrenals/Urinary Tract: --Adrenal glands: No adrenal hemorrhage. --Right kidney/ureter: The right kidney is unremarkable without evidence of hydronephrosis. --Left kidney/ureter: Again identified is a large cyst involving the lower pole the left kidney. There are either large peripelvic cysts versus is left-sided hydronephrosis with a UPJ obstruction. There does appear to be some  delayed enhancement of the left kidney with respect to the right on the patient's prior CT from January 25th. --Urinary bladder: The urinary bladder is moderately distended. Stomach/Bowel: --Stomach/Duodenum: No hiatal hernia or other gastric abnormality. Normal duodenal course and caliber. --Small bowel: No dilatation or inflammation. --Colon: There is a large amount of stool in the rectum with some fat stranding and free fluid in the presacral space. There are scattered colonic diverticula without CT evidence of diverticulitis. --Appendix: Normal. Vascular/Lymphatic: Atherosclerotic calcification is present within the non-aneurysmal abdominal aorta, without hemodynamically significant stenosis. --No retroperitoneal lymphadenopathy. --No mesenteric lymphadenopathy. --No pelvic or inguinal lymphadenopathy. Reproductive: Status post hysterectomy. No adnexal mass. Other: No ascites or free air. The abdominal wall is normal. Musculoskeletal. Again identified are findings of diskitis osteomyelitis at the T12-L1 level. There appears to be an extradural hyperdense collection measuring at least 1.2 by 0.8 by 3.6 cm extending from the inferior endplate of the Q22 vertebral body superiorly to terminate near the inferior endplate of the L79 vertebral body. This appears similar to prior MRI given differences in modality and likely represents the large known disc extrusion. IMPRESSION: 1. Mild peripancreatic fat stranding concerning for developing pancreatitis. Correlation with lipase is recommended. 2. Large amount of stool in the rectum with presacral free fluid and rectal wall thickening is concerning for developing stercoral colitis. 3. Again identified are findings of T12-L1 discitis osteomyelitis. This is better visualized on the prior MRI. 4. Sigmoid diverticulosis without CT evidence of diverticulitis. Electronically Signed   By: Constance Holster M.D.   On: 12/05/2018 00:53   Dg Chest 2 View  Result Date:  12/04/2018 CLINICAL DATA:  Initial evaluation for acute chest pain. EXAM: CHEST - 2 VIEW COMPARISON:  Prior radiograph from 04/21/2016. FINDINGS: Transverse heart size at the upper limits of normal. Mediastinal silhouette within normal limits. Aortic atherosclerosis. Lungs normally inflated. No focal infiltrates. No pulmonary edema or pleural effusion. No pneumothorax. No acute osseous finding.  IMPRESSION: 1. No active cardiopulmonary disease. 2. Aortic atherosclerosis. Electronically Signed   By: Jeannine Boga M.D.   On: 12/04/2018 21:27   Dg Pelvis 1-2 Views  Result Date: 12/04/2018 CLINICAL DATA:  Initial evaluation for acute pain. EXAM: PELVIS - 1-2 VIEW COMPARISON:  None. FINDINGS: No acute fracture dislocation. No pubic diastasis. SI joints approximated. Femoral heads in normal alignment within the acetabula. Femoral head heights maintained. Fusion hardware partially visualize within the lumbar spine. Mild-to-moderate osteoarthritic changes about the hips bilaterally. Large amount of retained stool seen within the rectal vault. Soft tissues demonstrate no acute finding. IMPRESSION: 1. No acute osseous abnormality about the pelvis. 2. Large amount of retained stool within the rectal vault, suggesting constipation. 3. Mild to moderate degenerative osteoarthrosis about the hips bilaterally. Electronically Signed   By: Jeannine Boga M.D.   On: 12/04/2018 21:29   Ct Head Wo Contrast  Result Date: 12/04/2018 CLINICAL DATA:  Head trauma, altered mental status EXAM: CT HEAD WITHOUT CONTRAST CT CERVICAL SPINE WITHOUT CONTRAST TECHNIQUE: Multidetector CT imaging of the head and cervical spine was performed following the standard protocol without intravenous contrast. Multiplanar CT image reconstructions of the cervical spine were also generated. COMPARISON:  10/12/2013 FINDINGS: CT HEAD FINDINGS Brain: No evidence of acute infarction, hemorrhage, hydrocephalus, extra-axial collection or mass  lesion/mass effect. Periventricular white matter hypodensity. Vascular: No hyperdense vessel or unexpected calcification. Skull: Normal. Negative for fracture or focal lesion. Sinuses/Orbits: No acute finding. Other: None. CT CERVICAL SPINE FINDINGS Alignment: Normal. Skull base and vertebrae: No acute fracture. No primary bone lesion or focal pathologic process. Soft tissues and spinal canal: No prevertebral fluid or swelling. No visible canal hematoma. Disc levels: Moderate multilevel disc space height loss and osteophytosis. Upper chest: Negative. Other: None. IMPRESSION: 1. No acute intracranial pathology. Small-vessel white matter disease. 2.  No fracture or static subluxation of the cervical spine. Electronically Signed   By: Eddie Candle M.D.   On: 12/04/2018 20:56   Ct Cervical Spine Wo Contrast  Result Date: 12/04/2018 CLINICAL DATA:  Head trauma, altered mental status EXAM: CT HEAD WITHOUT CONTRAST CT CERVICAL SPINE WITHOUT CONTRAST TECHNIQUE: Multidetector CT imaging of the head and cervical spine was performed following the standard protocol without intravenous contrast. Multiplanar CT image reconstructions of the cervical spine were also generated. COMPARISON:  10/12/2013 FINDINGS: CT HEAD FINDINGS Brain: No evidence of acute infarction, hemorrhage, hydrocephalus, extra-axial collection or mass lesion/mass effect. Periventricular white matter hypodensity. Vascular: No hyperdense vessel or unexpected calcification. Skull: Normal. Negative for fracture or focal lesion. Sinuses/Orbits: No acute finding. Other: None. CT CERVICAL SPINE FINDINGS Alignment: Normal. Skull base and vertebrae: No acute fracture. No primary bone lesion or focal pathologic process. Soft tissues and spinal canal: No prevertebral fluid or swelling. No visible canal hematoma. Disc levels: Moderate multilevel disc space height loss and osteophytosis. Upper chest: Negative. Other: None. IMPRESSION: 1. No acute intracranial  pathology. Small-vessel white matter disease. 2.  No fracture or static subluxation of the cervical spine. Electronically Signed   By: Eddie Candle M.D.   On: 12/04/2018 20:56   Dg Femur Min 2 Views Left  Result Date: 12/04/2018 CLINICAL DATA:  Initial evaluation for acute pain. EXAM: LEFT FEMUR 2 VIEWS COMPARISON:  None. FINDINGS: No acute fracture or dislocation. Osseous mineralization within normal limits. Mild osteoarthritic changes noted about the left hip and knee. Visualized bony pelvis intact. No acute soft tissue abnormality. IMPRESSION: No acute osseous abnormality about the left femur. Electronically Signed  By: Jeannine Boga M.D.   On: 12/04/2018 21:18   Dg Femur Min 2 Views Right  Result Date: 12/04/2018 CLINICAL DATA:  Initial evaluation for acute pain. EXAM: RIGHT FEMUR 2 VIEWS COMPARISON:  None. FINDINGS: No acute fracture or dislocation. Visualized bony pelvis intact. Moderate osteoarthritic changes noted about the right hip and knee. Osseous mineralization normal. No acute soft tissue abnormality. IMPRESSION: 1. No acute osseous abnormality about the right femur. 2. Moderate degenerative osteoarthrosis about the right hip and knee. Electronically Signed   By: Jeannine Boga M.D.   On: 12/04/2018 21:23    Assessment/Plan Principal Problem:   Vertebral osteomyelitis [facility]) Active Problems:   Epidural abscess   Spinal stenosis   Pancreatitis   AKI (acute kidney injury) ([place])  Vertebral osteomyelitis, epidural abscess, severe spinal stenosis secondary to disc extrusion at the level of T12 vertebra MRI of lumbar spine done December 02, 2018 showing T12-L1 discitis-osteomyelitis with progressive disc space collapse and mildly progressive endplate erosion.  Ventral epidural phlegmon/abscess in the lower thoracic spine appears to have largely resolved, though incompletely evaluated on lumbar MRI.  Increased size of a large right-sided disc extrusion behind the T12 vertebral  body with increased severe spinal stenosis.  21 x 6 mm dorsal epidural fluid collection at T12/L1 contributes and may represent abscess or old hematoma.  Afebrile and no leukocytosis.  Lactic acid normal.  No signs of sepsis.  No acute urinary retention.  Not able to assess strength and sensation in the lower extremities on exam due to lack of patient cooperation.  Unclear whether she has lower extremity weakness, however, patient does state she has been ambulating at home with a walker. -Neurosurgery was consulted by ED provider.  I have also called and spoken to neurosurgery PA who informed me that the MRI has been reviewed by Dr. Trenton Gammon and there seems to be no need for urgent surgical intervention.  Neurosurgery will consult. -Received ceftriaxone for UTI.  Will hold off additional antibiotics at this time given no fever, leukocytosis, or signs of sepsis. -Please consult ID in the morning -Frequent neurochecks -N.p.o.  Neurosurgery evaluation pending.  Give IV fluid hydration. -Although would like to avoid opiates given severe constipation, patient continued to complain of back pain despite receiving fentanyl in the ED. Norco PRN ordered and seems to be controlling her pain.  Avoid NSAIDs given AKI.  Mild pancreatitis Lipase 196.  CT showing mild peripancreatic fat stranding concerning for developing pancreatitis.  Patient is not complaining of any abdominal pain and exam benign. -IV fluid hydration -Continue to monitor  Stercoral colitis CT showing large amount of stool in the rectum with presacral free fluid and rectal wall thickening concerning for developing stercoral colitis. -Dulcolax suppository scheduled.  Will need enema next if patient continues to be constipated.  AKI BUN 32.  Creatinine 1.3, baseline 0.7-0.8.   -IV fluid hydration -Continue to monitor renal function  UTI UA with positive nitrite, large amount of leukocytes, 21-50 WBCs, and many bacteria.  Afebrile and no  leukocytosis.  No signs of sepsis. -Received a dose of ceftriaxone. Will hold of additional antibiotic until ID evaluation for vertebral osteomyelitis and epidural abscess. -Urine culture pending  Paroxysmal atrial fibrillation Rate currently normal.  INR 1.8.  MRI with epidural fluid collection and concern for possible hematoma. -Hold Coumadin at this time, pending neurosurgery evaluation  DVT prophylaxis: SCDs Code Status: Patient wishes to be full code. Family Communication: No family available. Disposition Plan: Anticipate discharge after clinical  improvement. Consults called: Neurosurgery Admission status: It is my clinical opinion that admission to INPATIENT is reasonable and necessary in this 78 y.o. female  presenting with Vertebral osteomyelitis, epidural abscess, severe spinal stenosis secondary to disc extrusion at the level of T12 vertebra, mild pancreatitis, stercoral colitis, AKI, and UTI.   Given the aforementioned, the predictability of an adverse outcome is felt to be significant. I expect that the patient will require at least 2 midnights in the hospital to treat this condition.   The medical decision making on this patient was of high complexity and the patient is at high risk for clinical deterioration, therefore this is a level 3 visit.  Shela Leff MD [facility] Pager [phone]  If 7PM-7AM, please contact night-coverage [email or web address] Password [email or web address]  12/05/2018, 8:01 AM

## 2018-12-05 NOTE — Plan of Care (Signed)
  Problem: Clinical Measurements: Goal: Ability to maintain clinical measurements within normal limits will improve Outcome: Progressing Goal: Will remain free from infection Outcome: Progressing Goal: Respiratory complications will improve Outcome: Progressing Goal: Cardiovascular complication will be avoided Outcome: Progressing   Problem: Coping: Goal: Level of anxiety will decrease Outcome: Progressing   Problem: Pain Managment: Goal: General experience of comfort will improve Outcome: Progressing   Problem: Safety: Goal: Ability to remain free from injury will improve Outcome: Progressing   Problem: Skin Integrity: Goal: Risk for impaired skin integrity will decrease Outcome: Progressing   

## 2018-12-05 NOTE — Consult Note (Signed)
Neurosurgery Consultation  Reason for Consult: Low back pain Referring Physician: Adhikari  CC: Low back pain  HPI: This is a 78 y.o. woman for whom I recently did a laminectomy for an epidural abscess. She cannot tell me why she is currently in the hospital, her main complaint is low back pain. On her admission workup, she had a CT CAP that showed possible pancreatitis with some fat stranding as well as a mild AKI. She is unsure if she has any weakness, she says she has some pain in her legs but she is unable to localize it. She looks uncomfortable when moving around but unfortunately she is not able to describe to me how she feels besides her low back hurting. She denies incontinence or a change in strength in her lower extremities, states she has been walking with a walker. Given her continued pain and h/o abscess, I ordered a new MRI as an outpatient and she was supposed to see me in clinic tomorrow to go over the results.    ROS: A 14 point ROS was performed and is negative except as noted in the HPI.   PMHx:  Past Medical History:  Diagnosis Date  . Allergic rhinitis   . Arthritis    "all over"  . CHF (congestive heart failure) ([place])   . Chronic back pain   . DJD (degenerative joint disease)   . GERD (gastroesophageal reflux disease)   . Hyperlipidemia   . Hypertension   . Lumbar stenosis   . OSA (obstructive sleep apnea)    "couldn't handle CPAP; use mouth guard some; not all the time" (01/06/2014)  . PAF (paroxysmal atrial fibrillation) ([place])   . Scoliosis    with radiculopathy L2-S1 with prior surgery  . Small bowel obstruction ([facility])    versus ileus after last bck surgery  . Spondylosis    FamHx:  Family History  Problem Relation Age of Onset  . Arthritis Mother   . Heart attack Father   . Hypertension Sister   . Lung cancer Sister   . Asthma Sister   . Allergies Sister    SocHx:  reports that she has never smoked. She has never used smokeless tobacco. She reports  that she does not drink alcohol or use drugs.  Exam: Vital signs in last 24 hours: Temp:  [97.4 F (36.3 C)-98.1 F (36.7 C)] 97.7 F (36.5 C) (06/30 1230) Pulse Rate:  [48-93] 75 (06/30 1230) Resp:  [9-27] 22 (06/30 1230) BP: (97-199)/(51-122) 135/65 (06/30 1230) SpO2:  [94 %-100 %] 99 % (06/30 1230) General: Awake, alert, cooperative, lying in bed in NAD Head: normocephalic and atruamatic HEENT: neck supple Pulmonary: breathing room air comfortably, no evidence of increased work of breathing Cardiac: irregularly irregular Abdomen: S NT ND Extremities: warm and well perfused x4 Neuro: AOx3 but occasional hallucinations of people not actually in the room and occasional non-sensical statements, PERRL, EOMI, FS Strength 5/5 in BUE, 3/5 in BLE, she is unsure but thinks she has normal sensation in the lower extremities   Assessment and Plan: 78 y.o. woman with prior epidural abscess, now with continued back pain, admission workup notable for UTI, possible pancreatitis but no abdominal pain on my exam. 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    -will d/w the patient's family tomorrow to see if I can get a better idea of what has been progressing or worse. I'd also like  to see if the patient becomes more lucent with treatment of her UTI, as it's difficult to assess what her symptoms are. She does have significant stenosis at T12-L1, but her abscess is improved, which is a more acute pathology that I would worry about. She also has done so incredibly poorly since her last surgery, especially with respect to mental status, I am hesitant that further surgery would be of benefit, but need more information.  -please call with any concerns or questions  Judith Part, MD 12/05/18 2:10 PM [facility]

## 2018-12-05 NOTE — Progress Notes (Signed)
Patient is a 55 old female with history of arthritis, degenerative disc disease, diastolic CHF, hypertension, hyperlipidemia, OSA, proximal A. fib on Coumadin, Parkinson's disease who presented from home with complaints of back pain.  Patient is a very poor historian. MRI of the lumbar spine that was done on December 02, 2018 shows T12-L1discitis-osteomyelitis with progressive disc space collapse and mildly progressive endplate erosion.  Other findings: Ventral epidural phlegmon/abscess in the lower thoracic spine appears to have largely resolved, though incompletely evaluated on lumbar MRI.  Increased size of a large right-sided disc extrusion behind the T12 vertebral body with increased severe spinal stenosis.  CT abdomen/pelvis showed mild peripancreatic fatty stranding concerning for developing pancreatitis.  But patient does not complain of any abdominal pain today.  Also showed large amount of stool in the rectum, but patient already had a bowel movement this morning.  I will start her on clear liquid diet.  She had been following with orthopedics, ID after her back surgery( T9-12 laminectomy) for spinal epidural abscess on April 23.  Neurosurgery, ID already consulted.  We have held antibiotics for now in case she might need disc aspiration for identification of organism.  Cultures have been sent.  Patient seen and examined the bedside this morning.  Currently she is hemodynamically stable. She is currently alert but confused.  I talked to her daughters on phone.  They said that she is  confused on and off due to her pain medications and pain itself. She was on Coumadin for A. fib at home.  Coumadin has been held currently due to possible surgery as per neurosurgery.  No need to bridge with heparin for now.  Will restart warfarin as soon as it is appropriate.  Currently her heart rate is controlled.  She is on flecainide and metoprolol for rate control  Patient seen by Dr. Marlowe Sax this  morning.

## 2018-12-05 NOTE — Consult Note (Signed)
[facility]  Total days of antibiotics1       Reason for Consult: vertebral osteo    Referring Physician: Tawanna Solo  Principal Problem:   Vertebral osteomyelitis ([place]) Active Problems:   Epidural abscess   Spinal stenosis   Pancreatitis   AKI (acute kidney injury) ([place])    HPI: [name] is a 78 y.o. female with DDD, HTN, HLD, afib on anticoagulation and parkinson's disease, she was treated in April for T12-L1 discitis, osteomyelitis with large epidural abscess with associated cord compression-which was evacuated on 4/23 and treated with 8 wk,through June 18, of vancomycin plus ceftriaxone (OR cx were negative). She followed up with dr powers in ID clinic for which she was transitioned to doxycycline. She was readmitted on 6/27 for feeling poorly, disoriented, with back pain. Repeat mri hsows improvement of ventral epidural abscess but has some more endplate erosion at F64-P3 plus large right sided disc extrusion behind T12 with severe spinal stenosis. She appears very deconditioned. She also underwent CT abdomen/pelvis showed mild peripancreatic fatty stranding concerning for developing pancreatitis. Lipase is elevated but patient reports diffuse pain to back and abdomen. She is looking for "johnny". HPI limited due to delirium. Past Medical History:  Diagnosis Date   Allergic rhinitis    Arthritis    "all over"   CHF (congestive heart failure) (HCC)    Chronic back pain    DJD (degenerative joint disease)    GERD (gastroesophageal reflux disease)    Hyperlipidemia    Hypertension    Lumbar stenosis    OSA (obstructive sleep apnea)    "couldn't handle CPAP; use mouth guard some; not all the time" (01/06/2014)   PAF (paroxysmal atrial fibrillation) (HCC)    Scoliosis    with radiculopathy L2-S1 with prior surgery   Small bowel obstruction ([place])    versus ileus after last bck surgery   Spondylosis     Allergies:  Allergies    Allergen Reactions   Lyrica [Pregabalin] Other (See Comments)    Felt loopy   Zocor [Simvastatin] Other (See Comments)    Myalgias   Relafen [Nabumetone] Rash     MEDICATIONS:  bisacodyl  10 mg Rectal Daily   carbidopa-levodopa  2 tablet Oral TID   flecainide  50 mg Oral BID   gabapentin  600 mg Oral BID   mouth rinse  15 mL Mouth Rinse BID   methocarbamol  250 mg Oral BID   metoprolol succinate  100 mg Oral Daily   pantoprazole  40 mg Oral BID   polyethylene glycol  17 g Oral Daily   pramipexole  1 mg Oral QHS   pravastatin  40 mg Oral QHS    Social History   Tobacco Use   Smoking status: Never Smoker   Smokeless tobacco: Never Used  Substance Use Topics   Alcohol use: No    Alcohol/week: 0.0 standard drinks    Comment: 02/01/2014 " glasseof wine once in a blue moon; < once/year"   Drug use: No    Family History  Problem Relation Age of Onset   Arthritis Mother    Heart attack Father    Hypertension Sister    Lung cancer Sister    Asthma Sister    Allergies Sister     Review of Systems - back pain, mild abdominal pain. 12 point ros otherwise difficult to obtain due to delirium   OBJECTIVE: Temp:  [97.4 F (36.3 C)-98 F (36.7 C)]  97.7 F (36.5 C) (06/30 1230) Pulse Rate:  [68-93] 75 (06/30 1230) Resp:  [9-27] 22 (06/30 1230) BP: (97-199)/(59-117) 135/65 (06/30 1230) SpO2:  [97 %-100 %] 99 % (06/30 1230) Physical Exam  Constitutional:  oriented to person, appears chronically ill and well-nourished. mild distress.  HENT: [place]/AT, PERRLA, no scleral icterus Mouth/Throat: Oropharynx is clear and moist. No oropharyngeal exudate.  Cardiovascular: Normal rate, regular rhythm and normal heart sounds. Exam reveals no gallop and no friction rub.  No murmur heard.  Pulmonary/Chest: Effort normal and breath sounds normal. No respiratory distress.  has no wheezes.  Neck = supple, no nuchal rigidity Abdominal: Soft. Bowel sounds are normal.   exhibits no distension.  Lymphadenopathy: no cervical adenopathy. No axillary adenopathy Neurological: alert and oriented to person, only Weakness limited exam due to pain. Moves toes on right foot but not on left foot Skin: Skin is warm and dry. No rash noted. No erythema.  Psychiatric: appears distraught   LABS: Results for orders placed or performed during the hospital encounter of 12/04/18 (from the past 48 hour(s))  CBC with Differential     Status: Abnormal   Collection Time: 12/04/18  7:40 PM  Result Value Ref Range   WBC 9.5 4.0 - 10.5 K/uL   RBC 4.53 3.87 - 5.11 MIL/uL   Hemoglobin 12.1 12.0 - 15.0 g/dL   HCT 39.1 36.0 - 46.0 %   MCV 86.3 80.0 - 100.0 fL   MCH 26.7 26.0 - 34.0 pg   MCHC 30.9 30.0 - 36.0 g/dL   RDW 16.2 (H) 11.5 - 15.5 %   Platelets 181 150 - 400 K/uL   nRBC 0.0 0.0 - 0.2 %   Neutrophils Relative % 85 %   Neutro Abs 8.1 (H) 1.7 - 7.7 K/uL   Lymphocytes Relative 6 %   Lymphs Abs 0.6 (L) 0.7 - 4.0 K/uL   Monocytes Relative 6 %   Monocytes Absolute 0.6 0.1 - 1.0 K/uL   Eosinophils Relative 2 %   Eosinophils Absolute 0.2 0.0 - 0.5 K/uL   Basophils Relative 0 %   Basophils Absolute 0.0 0.0 - 0.1 K/uL   Immature Granulocytes 1 %   Abs Immature Granulocytes 0.05 0.00 - 0.07 K/uL    Comment: Performed at [facility], [address], [place], [place] [address]  Comprehensive metabolic panel     Status: Abnormal   Collection Time: 12/04/18  7:40 PM  Result Value Ref Range   Sodium 133 (L) 135 - 145 mmol/L   Potassium 3.8 3.5 - 5.1 mmol/L   Chloride 96 (L) 98 - 111 mmol/L   CO2 26 22 - 32 mmol/L   Glucose, Bld 94 70 - 99 mg/dL   BUN 32 (H) 8 - 23 mg/dL   Creatinine, Ser 1.33 (H) 0.44 - 1.00 mg/dL   Calcium 9.5 8.9 - 10.3 mg/dL   Total Protein 6.2 (L) 6.5 - 8.1 g/dL   Albumin 3.4 (L) 3.5 - 5.0 g/dL   AST 16 15 - 41 U/L   ALT 10 0 - 44 U/L   Alkaline Phosphatase 97 38 - 126 U/L   Total Bilirubin 0.8 0.3 - 1.2 mg/dL   GFR calc non Af Amer 39 (L)  >60 mL/min   GFR calc Af Amer 45 (L) >60 mL/min   Anion gap 11 5 - 15    Comment: Performed at [facility], [place] [address], [place], [place] [address]  Lipase, blood  Status: Abnormal   Collection Time: 12/04/18  7:40 PM  Result Value Ref Range   Lipase 196 (H) 11 - 51 U/L    Comment: Performed at [facility], [place] [address] [place], [place] [address]  Protime-INR     Status: Abnormal   Collection Time: 12/04/18  7:40 PM  Result Value Ref Range   Prothrombin Time 20.4 (H) 11.4 - 15.2 seconds   INR 1.8 (H) 0.8 - 1.2    Comment: (NOTE) INR goal varies based on device and disease states. Performed at [facility], [place] [address], [place], [place] [address]   CK     Status: None   Collection Time: 12/04/18  7:40 PM  Result Value Ref Range   Total CK 56 38 - 234 U/L    Comment: Performed at [facility], [place] [address], [place], [place] [address]  Blood culture (routine x 2)     Status: None (Preliminary result)   Collection Time: 12/04/18  8:05 PM   Specimen: BLOOD  Result Value Ref Range   Specimen Description BLOOD LEFT ANTECUBITAL    Special Requests      BOTTLES DRAWN AEROBIC AND ANAEROBIC Blood Culture results may not be optimal due to an excessive volume of blood received in culture bottles   Culture      NO GROWTH < 24 HOURS Performed at [facility] [address], [place], [place] [address]    Report Status PENDING   Blood culture (routine x 2)     Status: None (Preliminary result)   Collection Time: 12/04/18  8:07 PM   Specimen: BLOOD  Result Value Ref Range   Specimen Description BLOOD RIGHT ANTECUBITAL    Special Requests      BOTTLES DRAWN AEROBIC AND ANAEROBIC Blood Culture results may not be optimal due to an inadequate volume of blood received in culture bottles   Culture      NO GROWTH < 24 HOURS Performed at [facility] [address] [place], [place] [address]    Report Status PENDING   Lactic acid, plasma      Status: None   Collection Time: 12/04/18 11:33 PM  Result Value Ref Range   Lactic Acid, Venous 1.6 0.5 - 1.9 mmol/L    Comment: Performed at [facility], [place] [address] [place], [place] [address]  Urinalysis, Routine w reflex microscopic     Status: Abnormal   Collection Time: 12/04/18 11:41 PM  Result Value Ref Range   Color, Urine YELLOW YELLOW   APPearance CLEAR CLEAR   Specific Gravity, Urine 1.018 1.005 - 1.030   pH 5.0 5.0 - 8.0   Glucose, UA NEGATIVE NEGATIVE mg/dL   Hgb urine dipstick SMALL (A) NEGATIVE   Bilirubin Urine NEGATIVE NEGATIVE   Ketones, ur NEGATIVE NEGATIVE mg/dL   Protein, ur NEGATIVE NEGATIVE mg/dL   Nitrite POSITIVE (A) NEGATIVE   Leukocytes,Ua LARGE (A) NEGATIVE   RBC / HPF 0-5 0 - 5 RBC/hpf   WBC, UA 21-50 0 - 5 WBC/hpf   Bacteria, UA MANY (A) NONE SEEN   Squamous Epithelial / LPF 0-5 0 - 5   Budding Yeast PRESENT    Hyaline Casts, UA PRESENT    Ca Oxalate Crys, UA PRESENT     Comment: Performed at [facility], [address], [place], [place] [address]  SARS Coronavirus 2 (CEPHEID - Performed in [facility] hospital lab), [place]  Status: None   Collection Time: 12/05/18 12:30 AM   Specimen: Nasopharyngeal Swab  Result Value Ref Range   SARS Coronavirus 2 NEGATIVE NEGATIVE    Comment: (NOTE) If result is NEGATIVE SARS-CoV-2 target nucleic acids are NOT DETECTED. The SARS-CoV-2 RNA is generally detectable in upper and lower  respiratory specimens during the acute phase of infection. The lowest  concentration of SARS-CoV-2 viral copies this assay can detect is 250  copies / mL. A negative result does not preclude SARS-CoV-2 infection  and should not be used as the sole basis for treatment or other  patient management decisions.  A negative result may occur with  improper specimen collection / handling, submission of specimen other  than nasopharyngeal swab, presence of viral mutation(s) within the  areas targeted by this  assay, and inadequate number of viral copies  (<250 copies / mL). A negative result must be combined with clinical  observations, patient history, and epidemiological information. If result is POSITIVE SARS-CoV-2 target nucleic acids are DETECTED. The SARS-CoV-2 RNA is generally detectable in upper and lower  respiratory specimens dur ing the acute phase of infection.  Positive  results are indicative of active infection with SARS-CoV-2.  Clinical  correlation with patient history and other diagnostic information is  necessary to determine patient infection status.  Positive results do  not rule out bacterial infection or co-infection with other viruses. If result is PRESUMPTIVE POSTIVE SARS-CoV-2 nucleic acids MAY BE PRESENT.   A presumptive positive result was obtained on the submitted specimen  and confirmed on repeat testing.  While 2019 novel coronavirus  (SARS-CoV-2) nucleic acids may be present in the submitted sample  additional confirmatory testing may be necessary for epidemiological  and / or clinical management purposes  to differentiate between  SARS-CoV-2 and other Sarbecovirus currently known to infect humans.  If clinically indicated additional testing with an alternate test  methodology [phone]) is advised. The SARS-CoV-2 RNA is generally  detectable in upper and lower respiratory sp ecimens during the acute  phase of infection. The expected result is Negative. Fact Sheet for Patients:  StrictlyIdeas.no Fact Sheet for Healthcare Providers: BankingDealers.co.za This test is not yet approved or cleared by the [place] FDA and has been authorized for detection and/or diagnosis of SARS-CoV-2 by FDA under an Emergency Use Authorization (EUA).  This EUA will remain in effect (meaning this test can be used) for the duration of the COVID-19 declaration under Section 564(b)(1) of the Act, 21 U.S.C. section 360bbb-3(b)(1),  unless the authorization is terminated or revoked sooner. Performed at [facility], [place] [address] [place], [place] [address]   Basic metabolic panel     Status: Abnormal   Collection Time: 12/05/18 11:25 AM  Result Value Ref Range   Sodium 137 135 - 145 mmol/L   Potassium 3.9 3.5 - 5.1 mmol/L   Chloride 105 98 - 111 mmol/L   CO2 20 (L) 22 - 32 mmol/L   Glucose, Bld 74 70 - 99 mg/dL   BUN 29 (H) 8 - 23 mg/dL   Creatinine, Ser 1.00 0.44 - 1.00 mg/dL   Calcium 9.1 8.9 - 10.3 mg/dL   GFR calc non Af Amer 55 (L) >60 mL/min   GFR calc Af Amer >60 >60 mL/min   Anion gap 12 5 - 15    Comment: Performed at [facility], [place] [address] [place], [place] [address]    MICRO: reviewed IMAGING:Increased size of a large right-sided disc extrusion  behind the T12 vertebral body with increased severe spinal stenosis. A 21 x 6 mm dorsal epidural fluid collection at T12-L1 contributes and may represent abscess or old hematoma. Ct Abdomen Pelvis Wo Contrast  Result Date: 12/05/2018 CLINICAL DATA:  Decreased appetite in generalized weakness. Back surgery 8 weeks ago. EXAM: CT ABDOMEN AND PELVIS WITHOUT CONTRAST TECHNIQUE: Multidetector CT imaging of the abdomen and pelvis was performed following the standard protocol without IV contrast. COMPARISON:  CT dated July 01, 2018 FINDINGS: Lower chest: There is atelectasis at the left lung base.The heart size is normal. Hepatobiliary: The liver is normal. Status post cholecystectomy.There is no biliary ductal dilation. Pancreas: There is mild haziness about the pancreas. There is no significant peripancreatic drainable fluid collection. Spleen: No splenic laceration or hematoma. Adrenals/Urinary Tract: --Adrenal glands: No adrenal hemorrhage. --Right kidney/ureter: The right kidney is unremarkable without evidence of hydronephrosis. --Left kidney/ureter: Again identified is a large cyst involving the lower pole the left kidney. There are either  large peripelvic cysts versus is left-sided hydronephrosis with a UPJ obstruction. There does appear to be some delayed enhancement of the left kidney with respect to the right on the patient's prior CT from January 25th. --Urinary bladder: The urinary bladder is moderately distended. Stomach/Bowel: --Stomach/Duodenum: No hiatal hernia or other gastric abnormality. Normal duodenal course and caliber. --Small bowel: No dilatation or inflammation. --Colon: There is a large amount of stool in the rectum with some fat stranding and free fluid in the presacral space. There are scattered colonic diverticula without CT evidence of diverticulitis. --Appendix: Normal. Vascular/Lymphatic: Atherosclerotic calcification is present within the non-aneurysmal abdominal aorta, without hemodynamically significant stenosis. --No retroperitoneal lymphadenopathy. --No mesenteric lymphadenopathy. --No pelvic or inguinal lymphadenopathy. Reproductive: Status post hysterectomy. No adnexal mass. Other: No ascites or free air. The abdominal wall is normal. Musculoskeletal. Again identified are findings of diskitis osteomyelitis at the T12-L1 level. There appears to be an extradural hyperdense collection measuring at least 1.2 by 0.8 by 3.6 cm extending from the inferior endplate of the Z61 vertebral body superiorly to terminate near the inferior endplate of the W96 vertebral body. This appears similar to prior MRI given differences in modality and likely represents the large known disc extrusion. IMPRESSION: 1. Mild peripancreatic fat stranding concerning for developing pancreatitis. Correlation with lipase is recommended. 2. Large amount of stool in the rectum with presacral free fluid and rectal wall thickening is concerning for developing stercoral colitis. 3. Again identified are findings of T12-L1 discitis osteomyelitis. This is better visualized on the prior MRI. 4. Sigmoid diverticulosis without CT evidence of diverticulitis.  Electronically Signed   By: Constance Holster M.D.   On: 12/05/2018 00:53   Dg Chest 2 View  Result Date: 12/04/2018 CLINICAL DATA:  Initial evaluation for acute chest pain. EXAM: CHEST - 2 VIEW COMPARISON:  Prior radiograph from 04/21/2016. FINDINGS: Transverse heart size at the upper limits of normal. Mediastinal silhouette within normal limits. Aortic atherosclerosis. Lungs normally inflated. No focal infiltrates. No pulmonary edema or pleural effusion. No pneumothorax. No acute osseous finding. IMPRESSION: 1. No active cardiopulmonary disease. 2. Aortic atherosclerosis. Electronically Signed   By: Jeannine Boga M.D.   On: 12/04/2018 21:27   Dg Pelvis 1-2 Views  Result Date: 12/04/2018 CLINICAL DATA:  Initial evaluation for acute pain. EXAM: PELVIS - 1-2 VIEW COMPARISON:  None. FINDINGS: No acute fracture dislocation. No pubic diastasis. SI joints approximated. Femoral heads in normal alignment within the acetabula. Femoral head heights maintained. Fusion hardware partially visualize within the  lumbar spine. Mild-to-moderate osteoarthritic changes about the hips bilaterally. Large amount of retained stool seen within the rectal vault. Soft tissues demonstrate no acute finding. IMPRESSION: 1. No acute osseous abnormality about the pelvis. 2. Large amount of retained stool within the rectal vault, suggesting constipation. 3. Mild to moderate degenerative osteoarthrosis about the hips bilaterally. Electronically Signed   By: Jeannine Boga M.D.   On: 12/04/2018 21:29   Ct Head Wo Contrast  Result Date: 12/04/2018 CLINICAL DATA:  Head trauma, altered mental status EXAM: CT HEAD WITHOUT CONTRAST CT CERVICAL SPINE WITHOUT CONTRAST TECHNIQUE: Multidetector CT imaging of the head and cervical spine was performed following the standard protocol without intravenous contrast. Multiplanar CT image reconstructions of the cervical spine were also generated. COMPARISON:  10/12/2013 FINDINGS: CT HEAD  FINDINGS Brain: No evidence of acute infarction, hemorrhage, hydrocephalus, extra-axial collection or mass lesion/mass effect. Periventricular white matter hypodensity. Vascular: No hyperdense vessel or unexpected calcification. Skull: Normal. Negative for fracture or focal lesion. Sinuses/Orbits: No acute finding. Other: None. CT CERVICAL SPINE FINDINGS Alignment: Normal. Skull base and vertebrae: No acute fracture. No primary bone lesion or focal pathologic process. Soft tissues and spinal canal: No prevertebral fluid or swelling. No visible canal hematoma. Disc levels: Moderate multilevel disc space height loss and osteophytosis. Upper chest: Negative. Other: None. IMPRESSION: 1. No acute intracranial pathology. Small-vessel white matter disease. 2.  No fracture or static subluxation of the cervical spine. Electronically Signed   By: Eddie Candle M.D.   On: 12/04/2018 20:56   Ct Cervical Spine Wo Contrast  Result Date: 12/04/2018 CLINICAL DATA:  Head trauma, altered mental status EXAM: CT HEAD WITHOUT CONTRAST CT CERVICAL SPINE WITHOUT CONTRAST TECHNIQUE: Multidetector CT imaging of the head and cervical spine was performed following the standard protocol without intravenous contrast. Multiplanar CT image reconstructions of the cervical spine were also generated. COMPARISON:  10/12/2013 FINDINGS: CT HEAD FINDINGS Brain: No evidence of acute infarction, hemorrhage, hydrocephalus, extra-axial collection or mass lesion/mass effect. Periventricular white matter hypodensity. Vascular: No hyperdense vessel or unexpected calcification. Skull: Normal. Negative for fracture or focal lesion. Sinuses/Orbits: No acute finding. Other: None. CT CERVICAL SPINE FINDINGS Alignment: Normal. Skull base and vertebrae: No acute fracture. No primary bone lesion or focal pathologic process. Soft tissues and spinal canal: No prevertebral fluid or swelling. No visible canal hematoma. Disc levels: Moderate multilevel disc space height  loss and osteophytosis. Upper chest: Negative. Other: None. IMPRESSION: 1. No acute intracranial pathology. Small-vessel white matter disease. 2.  No fracture or static subluxation of the cervical spine. Electronically Signed   By: Eddie Candle M.D.   On: 12/04/2018 20:56   Dg Femur Min 2 Views Left  Result Date: 12/04/2018 CLINICAL DATA:  Initial evaluation for acute pain. EXAM: LEFT FEMUR 2 VIEWS COMPARISON:  None. FINDINGS: No acute fracture or dislocation. Osseous mineralization within normal limits. Mild osteoarthritic changes noted about the left hip and knee. Visualized bony pelvis intact. No acute soft tissue abnormality. IMPRESSION: No acute osseous abnormality about the left femur. Electronically Signed   By: Jeannine Boga M.D.   On: 12/04/2018 21:18   Dg Femur Min 2 Views Right  Result Date: 12/04/2018 CLINICAL DATA:  Initial evaluation for acute pain. EXAM: RIGHT FEMUR 2 VIEWS COMPARISON:  None. FINDINGS: No acute fracture or dislocation. Visualized bony pelvis intact. Moderate osteoarthritic changes noted about the right hip and knee. Osseous mineralization normal. No acute soft tissue abnormality. IMPRESSION: 1. No acute osseous abnormality about the right femur. 2. Moderate  degenerative osteoarthrosis about the right hip and knee. Electronically Signed   By: Jeannine Boga M.D.   On: 12/04/2018 21:23    Assessment/Plan:  78yo F with multiple co-morbidities treated recently for culture negative epidural abscess thoraco-lumbar osteomyelitis with 8 wk of vancomycin and ceftriaxone now supposed to be on chronic suppression with doxycycline. She is admitted for delirium, worsening myalgias, back pain. Labs show abnormal UA, imaging possible pancreatitis plus MRI imaging showing some progression of endplate erosion from osteomyelitis with severe central canal stenosis at T12 with possible residual hematoma or epidural abscess near that region.  Delirium = maybe due to acute  infection. Agree with ceftriaxone for uti. Await culture results. Would not treat yeast noted on ua  pancreaitits = consider having patient npo and iv fluids to see if any improvement in her symptoms  Mri findings for severe central canal stenosis at T12 and residual adjacent fluid collection = I suspect that the fluid maybe sterile given 8wk of IV therapy now on orals not unusual to see progression my concern is if severe central canal stenosis at T12 fits with neuro findings decrease strength on left leg though she is difficult to exam given her delirium. She would be high risk for further surgery given her deconditioning. Recommend to check sed rate and crp.  Dr Megan Salon to see tomorrow for further recs

## 2018-12-06 DIAGNOSIS — M545 Low back pain: Secondary | ICD-10-CM

## 2018-12-06 DIAGNOSIS — I4891 Unspecified atrial fibrillation: Secondary | ICD-10-CM

## 2018-12-06 DIAGNOSIS — Z792 Long term (current) use of antibiotics: Secondary | ICD-10-CM

## 2018-12-06 DIAGNOSIS — N390 Urinary tract infection, site not specified: Secondary | ICD-10-CM

## 2018-12-06 DIAGNOSIS — G9340 Encephalopathy, unspecified: Secondary | ICD-10-CM

## 2018-12-06 DIAGNOSIS — R531 Weakness: Secondary | ICD-10-CM

## 2018-12-06 LAB — CBC WITH DIFFERENTIAL/PLATELET
Abs Immature Granulocytes: 0.03 10*3/uL (ref 0.00–0.07)
Basophils Absolute: 0 10*3/uL (ref 0.0–0.1)
Basophils Relative: 0 %
Eosinophils Absolute: 0.1 10*3/uL (ref 0.0–0.5)
Eosinophils Relative: 1 %
HCT: 32.7 % — ABNORMAL LOW (ref 36.0–46.0)
Hemoglobin: 10.5 g/dL — ABNORMAL LOW (ref 12.0–15.0)
Immature Granulocytes: 1 %
Lymphocytes Relative: 11 %
Lymphs Abs: 0.7 10*3/uL (ref 0.7–4.0)
MCH: 27.5 pg (ref 26.0–34.0)
MCHC: 32.1 g/dL (ref 30.0–36.0)
MCV: 85.6 fL (ref 80.0–100.0)
Monocytes Absolute: 0.6 10*3/uL (ref 0.1–1.0)
Monocytes Relative: 10 %
Neutro Abs: 4.6 10*3/uL (ref 1.7–7.7)
Neutrophils Relative %: 77 %
Platelets: 147 10*3/uL — ABNORMAL LOW (ref 150–400)
RBC: 3.82 MIL/uL — ABNORMAL LOW (ref 3.87–5.11)
RDW: 16.4 % — ABNORMAL HIGH (ref 11.5–15.5)
WBC: 6 10*3/uL (ref 4.0–10.5)
nRBC: 0 % (ref 0.0–0.2)

## 2018-12-06 LAB — C-REACTIVE PROTEIN: CRP: 7.1 mg/dL — ABNORMAL HIGH (ref <1.0)

## 2018-12-06 LAB — PROTIME-INR
INR: 2 — ABNORMAL HIGH (ref 0.8–1.2)
Prothrombin Time: 22.1 seconds — ABNORMAL HIGH (ref 11.4–15.2)

## 2018-12-06 LAB — SEDIMENTATION RATE: Sed Rate: 32 mm/hr — ABNORMAL HIGH (ref 0–22)

## 2018-12-06 MED ORDER — DOXYCYCLINE HYCLATE 100 MG PO TABS
100.0000 mg | ORAL_TABLET | Freq: Two times a day (BID) | ORAL | Status: DC
  Administered 2018-12-06 – 2018-12-19 (×25): 100 mg via ORAL
  Filled 2018-12-06 (×25): qty 1

## 2018-12-06 NOTE — Progress Notes (Signed)
PROGRESS NOTE  [name] ZGY:[id number] DOB: 03-17-1941 DOA: 12/04/2018 PCP: [name], MD  HPI/Recap of past 24 hours: [name] is a 78 y.o. female with medical history significant of arthritis, DJD, CHF, hypertension, hyperlipidemia, OSA, paroxysmal atrial fibrillation, Patient is a very poor historian and it was difficult to obtain a thorough history from her.  Patient reports having pain all over her body.  She is not sure for how long.  Does state that she has pain in her lower back and bilateral thighs.  She is not sure if she is having any saddle anesthesia or urinary/fecal incontinence.  States she is ambulating at home on her own with the help of a walker.  Denies any fevers, chills, chest pain, shortness of breath, cough, abdominal pain, nausea, or vomiting.  States sometimes in the past she had diarrhea and sometimes constipation, she is not sure if she is having diarrhea or constipation at present.  She is not able to tell me if she is currently taking doxycycline.  No additional history could be obtained from her.  ED Course: Afebrile.  Not tachycardic, tachypneic, or hypotensive.  Not hypoxic.  No leukocytosis.  Lactic acid normal.  INR 1.8.  BUN 32.  Creatinine 1.3, baseline 0.7-0.8.  Lipase 196.  LFTs normal.  CK normal.  UA with positive nitrite, large amount of leukocytes, 21-50 WBCs, and many bacteria.  Urine culture pending.  Blood culture x2 pending.  COVID-19 rapid test pending. Chest x-ray showing no active cardiopulmonary disease. CT head negative for acute finding. CT C-spine negative for acute finding. X-ray of left femur showing no acute osseous abnormality. X-ray of right femur showing no acute osseous abnormality.  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 X-ray of pelvis showing no acute osseous abnormality.  Mild to moderate degenerative osteoarthritis of the hips bilaterally. CT abdomen pelvis showing mild  peripancreatic fat stranding concerning for developing pancreatitis.  Large amount of stool in the rectum with presacral free fluid and rectal wall thickening concerning for developing stercoral colitis. MRI of lumbar spine done December 02, 2018 showing T12-L1 discitis-osteomyelitis with progressive disc space collapse and mildly progressive endplate erosion.  Ventral epidural phlegmon/abscess in the lower thoracic spine appears to have largely resolved, though incompletely evaluated on lumbar MRI.  Increased size of a large right-sided disc extrusion behind the T12 vertebral body with increased severe spinal stenosis.  21 x 6 mm dorsal epidural fluid collection at T12/L1 contributes and may represent abscess or old hematoma. Nursing staff reported that patient has been voiding normally in the ED. No acute urinary retention. Patient received 1 L IV fluid bolus, ceftriaxone, and fentanyl in the ED.  12/06/18: Patient seen and examined at her bedside.  She is alert and oriented x3.  She reports tingling in her lower extremities bilaterally.  She has no other complaints.   Assessment/Plan: Principal Problem:   Spinal stenosis Active Problems:   Epidural abscess   Vertebral osteomyelitis (HCC)   AKI (acute kidney injury) ([place])   UTI (urinary tract infection)  Thoracolumbar vertebral osteomyelitis/epidural abscess/severe spinal stenosis secondary to disc extrusion at the level of T12 vertebrae Recently treated for culture-negative epidural abscess MRI of lumbar spine done December 02, 2018 showing T12-L1 discitis-osteomyelitis with progressive disc space collapse and mildly progressive endplate erosion.  Ventral epidural phlegmon/abscess in the lower thoracic spine appears to have largely resolved, though incompletely evaluated on lumbar MRI.  Increased size of a large right-sided disc extrusion behind  the T12 vertebral body with increased severe spinal stenosis.  21 x 6 mm dorsal epidural fluid collection at  T12/L1 contributes and may represent abscess or old hematoma.   She is afebrile with no leukocytosis CRP 7.1 and sed rate 32 elevated ID and neurosurgery following  Mild pancreatitis Lipase 196 CT showing mild peripancreatic fat stranding concerning for developing pancreatitis Asymptomatic on exam Repeat lipase level in the morning  AKI, resolved Presented with creatinine of 1.33 Creatinine today 1.0 Avoid nephrotoxins Monitor urine output Repeat BMP in the morning  UTI UA positive for pyuria Continue Rocephin for UTI Urine culture pending  Hypertension Continue Toprol-XL 100 mg daily Monitor vital signs  Stercoral colitis CT showing large amount of stool in the rectum with presacral free fluid and rectal wall thickening concerning for developing stercoral colitis. -Dulcolax suppository scheduled.  Will need enema next if patient continues to be constipated.  Paroxysmal atrial fibrillation Rate currently normal.  INR 1.8.  MRI with epidural fluid collection and concern for possible hematoma. -Hold Coumadin at this time, pending neurosurgery evaluation  Physical debility/ambulatory dysfunction PT OT to assess Fall precautions  DVT prophylaxis: SCDs Code Status: Patient wishes to be full code. Family Communication: No family available. Disposition Plan:  Possible discharge in 2 to 3 days when infectious disease and neurosurgery signed off. Consults called: Neurosurgery, infectious disease.    Objective: Vitals:   12/06/18 0027 12/06/18 0400 12/06/18 0513 12/06/18 0800  BP:  (!) 163/77  (!) 156/74  Pulse:  77  77  Resp:  19  14  Temp: 98 F (36.7 C)  97.9 F (36.6 C) 97.9 F (36.6 C)  TempSrc: Oral  Oral Oral  SpO2:  100%  100%    Intake/Output Summary (Last 24 hours) at 12/06/2018 1048 Last data filed at 12/06/2018 0600 Gross per 24 hour  Intake 494.38 ml  Output 300 ml  Net 194.38 ml   There were no vitals filed for this visit.  Exam:   General:  78 y.o. year-old female  Weak appearingwell developed well nourished in no acute distress.  Alert and oriented x3.  Cardiovascular: Regular rate and rhythm with no rubs or gallops.  No thyromegaly or JVD noted.    Respiratory: Clear to auscultation with no wheezes or rales. Good inspiratory effort.  Abdomen: Soft nontender nondistended with normal bowel sounds x4 quadrants.  Musculoskeletal: Trace lower extremity edema. 2/4 pulses in all 4 extremities.  Psychiatry: Mood is appropriate for condition and setting   Data Reviewed: CBC: Recent Labs  Lab 12/04/18 1940 12/06/18 0451  WBC 9.5 6.0  NEUTROABS 8.1* 4.6  HGB 12.1 10.5*  HCT 39.1 32.7*  MCV 86.3 85.6  PLT 181 694*   Basic Metabolic Panel: Recent Labs  Lab 12/04/18 1940 12/05/18 1125  NA 133* 137  K 3.8 3.9  CL 96* 105  CO2 26 20*  GLUCOSE 94 74  BUN 32* 29*  CREATININE 1.33* 1.00  CALCIUM 9.5 9.1   GFR: Estimated Creatinine Clearance: 49.5 mL/min (by C-G formula based on SCr of 1 mg/dL). Liver Function Tests: Recent Labs  Lab 12/04/18 1940  AST 16  ALT 10  ALKPHOS 97  BILITOT 0.8  PROT 6.2*  ALBUMIN 3.4*   Recent Labs  Lab 12/04/18 1940  LIPASE 196*   No results for input(s): AMMONIA in the last 168 hours. Coagulation Profile: Recent Labs  Lab 12/04/18 1940 12/06/18 0451  INR 1.8* 2.0*   Cardiac Enzymes: Recent Labs  Lab 12/04/18 1940  CKTOTAL 56   BNP (last 3 results) No results for input(s): PROBNP in the last 8760 hours. HbA1C: No results for input(s): HGBA1C in the last 72 hours. CBG: No results for input(s): GLUCAP in the last 168 hours. Lipid Profile: No results for input(s): CHOL, HDL, LDLCALC, TRIG, CHOLHDL, LDLDIRECT in the last 72 hours. Thyroid Function Tests: No results for input(s): TSH, T4TOTAL, FREET4, T3FREE, THYROIDAB in the last 72 hours. Anemia Panel: No results for input(s): VITAMINB12, FOLATE, FERRITIN, TIBC, IRON, RETICCTPCT in the last 72 hours. Urine  analysis:    Component Value Date/Time   COLORURINE YELLOW 12/04/2018 [place] 12/04/2018 2341   LABSPEC 1.018 12/04/2018 2341   PHURINE 5.0 12/04/2018 2341   GLUCOSEU NEGATIVE 12/04/2018 2341   HGBUR SMALL (A) 12/04/2018 2341   BILIRUBINUR NEGATIVE 12/04/2018 2341   KETONESUR NEGATIVE 12/04/2018 2341   PROTEINUR NEGATIVE 12/04/2018 2341   UROBILINOGEN 1.0 06/26/2010 1009   NITRITE POSITIVE (A) 12/04/2018 2341   LEUKOCYTESUR LARGE (A) 12/04/2018 2341   Sepsis Labs: @LABRCNTIP (procalcitonin:4,lacticidven:4)  ) Recent Results (from the past 240 hour(s))  Blood culture (routine x 2)     Status: None (Preliminary result)   Collection Time: 12/04/18  8:05 PM   Specimen: BLOOD  Result Value Ref Range Status   Specimen Description BLOOD LEFT ANTECUBITAL  Final   Special Requests   Final    BOTTLES DRAWN AEROBIC AND ANAEROBIC Blood Culture results may not be optimal due to an excessive volume of blood received in culture bottles   Culture   Final    NO GROWTH 2 DAYS Performed at [facility], [place] [address], [place], [place] [address]    Report Status PENDING  Incomplete  Blood culture (routine x 2)     Status: None (Preliminary result)   Collection Time: 12/04/18  8:07 PM   Specimen: BLOOD  Result Value Ref Range Status   Specimen Description BLOOD RIGHT ANTECUBITAL  Final   Special Requests   Final    BOTTLES DRAWN AEROBIC AND ANAEROBIC Blood Culture results may not be optimal due to an inadequate volume of blood received in culture bottles   Culture   Final    NO GROWTH 2 DAYS Performed at [facility], [place] [address], [place], [place] [address]    Report Status PENDING  Incomplete  SARS Coronavirus 2 (CEPHEID - Performed in [place] hospital lab), Hosp Order     Status: None   Collection Time: 12/05/18 12:30 AM   Specimen: Nasopharyngeal Swab  Result Value Ref Range Status   SARS Coronavirus 2 NEGATIVE NEGATIVE Final    Comment: (NOTE) If  result is NEGATIVE SARS-CoV-2 target nucleic acids are NOT DETECTED. The SARS-CoV-2 RNA is generally detectable in upper and lower  respiratory specimens during the acute phase of infection. The lowest  concentration of SARS-CoV-2 viral copies this assay can detect is 250  copies / mL. A negative result does not preclude SARS-CoV-2 infection  and should not be used as the sole basis for treatment or other  patient management decisions.  A negative result may occur with  improper specimen collection / handling, submission of specimen other  than nasopharyngeal swab, presence of viral mutation(s) within the  areas targeted by this assay, and inadequate number of viral copies  (<250 copies / mL). A negative result must be combined with clinical  observations, patient history, and epidemiological information. If result is POSITIVE SARS-CoV-2 target nucleic acids are DETECTED.  The SARS-CoV-2 RNA is generally detectable in upper and lower  respiratory specimens dur ing the acute phase of infection.  Positive  results are indicative of active infection with SARS-CoV-2.  Clinical  correlation with patient history and other diagnostic information is  necessary to determine patient infection status.  Positive results do  not rule out bacterial infection or co-infection with other viruses. If result is PRESUMPTIVE POSTIVE SARS-CoV-2 nucleic acids MAY BE PRESENT.   A presumptive positive result was obtained on the submitted specimen  and confirmed on repeat testing.  While 2019 novel coronavirus  (SARS-CoV-2) nucleic acids may be present in the submitted sample  additional confirmatory testing may be necessary for epidemiological  and / or clinical management purposes  to differentiate between  SARS-CoV-2 and other Sarbecovirus currently known to infect humans.  If clinically indicated additional testing with an alternate test  methodology [phone]) is advised. The SARS-CoV-2 RNA is generally    detectable in upper and lower respiratory sp ecimens during the acute  phase of infection. The expected result is Negative. Fact Sheet for Patients:  StrictlyIdeas.no Fact Sheet for Healthcare Providers: BankingDealers.co.za This test is not yet approved or cleared by the [place] FDA and has been authorized for detection and/or diagnosis of SARS-CoV-2 by FDA under an Emergency Use Authorization (EUA).  This EUA will remain in effect (meaning this test can be used) for the duration of the COVID-19 declaration under Section 564(b)(1) of the Act, 21 U.S.C. section 360bbb-3(b)(1), unless the authorization is terminated or revoked sooner. Performed at [facility], [place] [address], [place], [place] [address]       Studies: No results found.  Scheduled Meds:  bisacodyl  10 mg Rectal Daily   carbidopa-levodopa  2 tablet Oral TID   flecainide  50 mg Oral BID   gabapentin  600 mg Oral BID   mouth rinse  15 mL Mouth Rinse BID   methocarbamol  250 mg Oral BID   metoprolol succinate  100 mg Oral Daily   pantoprazole  40 mg Oral BID   polyethylene glycol  17 g Oral Daily   pramipexole  1 mg Oral QHS   pravastatin  40 mg Oral QHS    Continuous Infusions:  cefTRIAXone (ROCEPHIN)  IV 1 g (12/06/18 0040)     LOS: 1 day     Kayleen Memos, MD [facility] Pager [phone]  If 7PM-7AM, please contact night-coverage [email or web address] Password [facility] 12/06/2018, 10:48 AM

## 2018-12-06 NOTE — Progress Notes (Signed)
I spoke with the patient's daughter, who was able to provide some better history on what happened prior to admission. Her back and leg pain, roughly 50/50 has gotten progressively worse, especially with any movement like repositioning or putting her on a bedpan. Her daughter thinks that much of her confusion has been from the pain medications she has been receiving, which they have not been able to decrease due to the severity of her pain.  From a neurosurgical perspective, it is a mixed picture. Her leg pain sounds dysesthetic and, combined with her severe back pain, likely localize to her T12-L1 deformity and severe stenosis. Her abdominal exam is remarkably benign, so it is tough to say if any or how much of her back pain is from her mild pancreatitis. Given how poorly she did with just a decompression before and her high clinical frailty, I think that reoperation carries significant risk. Surgery would consist of decompressing that region, but would also require revision of her hardware construct due to her deformity and likely instability at that level. Given that it is a junctional level, it would require a few extra levels of instrumented fusion.   I discussed this with her daughter at length. Prior to her infection and surgery, the patient was living independently and functioning well. If the pain medications are the only cause of this, the only viable way I see to improve her function is to surgically decompress that area of stenosis.   I would prefer to see how she feels after the UTI and pancreatitis improve, then discuss with the family to see if they feel the patient would be willing to undergo surgery with a high risk of post-operative complications or not. I also told them that there is certainly no guarantee this will resolve her pain issues, but from a neurosurgical perspective it's the only option that I see.

## 2018-12-06 NOTE — Progress Notes (Addendum)
[facility] for Infectious Disease  Date of Admission:  12/04/2018   Total days of antibiotics 1        Day 1 - ceftriaxone         Principal Problem:   Spinal stenosis Active Problems:   Epidural abscess   Vertebral osteomyelitis (HCC)   AKI (acute kidney injury) ([place])   UTI (urinary tract infection)  . bisacodyl  10 mg Rectal Daily  . carbidopa-levodopa  2 tablet Oral TID  . flecainide  50 mg Oral BID  . gabapentin  600 mg Oral BID  . mouth rinse  15 mL Mouth Rinse BID  . methocarbamol  250 mg Oral BID  . metoprolol succinate  100 mg Oral Daily  . pantoprazole  40 mg Oral BID  . polyethylene glycol  17 g Oral Daily  . pramipexole  1 mg Oral QHS  . pravastatin  40 mg Oral QHS   ASSESSMENT: [name] is a 78 yo F w/ PMH of DDD, HTN, HLD, afib and parkinson's disease admit for acute encephalopathy on hospital day 2.  Delirium 2/2 UTI: AAOx3 this am and able to express her needs and follow directions without difficulty. Rapid improvement after 1 day of ceftriaxone likely indicated UTI as cause of altered mental status.  - C/w ceftriaxone - F/u urine culture  Hx of Epidural abscess / osteomyelitis: With mentation improved, able to perform full neuro exam with concerning lower extremity weakness bilaterally. Unclear if due to deconditioning vs consequence of spinal stenosis. Likely due to consequence of prior infection. Low suspicion for re-infection with stable vitals and no leukocytosis. ESR and CRP elevated but could be explained by UTI. Family members confirm she was taking her doxycycline as prescribed. - Will resume home doxycycline - F/u blood culture - Appreciate neurosurgery recs  SUBJECTIVE:  She was examined and evaluated at bedside this AM. She is AAOx3 to name, place and date and able to answer complex questions. She states she believes she has been admitted for back surgery but is unable to recall her hospital course. She mentions endorsing 6/10  lower back pain which has not significantly changed. She denies any fevers, chills, nausea, vomiting or diarrhea. Denies any dysuria, frequency or urgency. She states she has been unable to move both her legs and mentions some tingling sensation of her lower extremities. When asked about her recent visit with Dr.Powell and recommendation to take doxycycline at home, she mentions she is on so many medications and have seen so many doctors that she has difficulty keeping track and is unsure of taking the doxycycline as prescribed.  Review of Systems: Review of Systems  Constitutional: Negative for chills, fever and malaise/fatigue.  Respiratory: Negative for shortness of breath.   Cardiovascular: Negative for leg swelling.  Gastrointestinal: Negative for constipation, diarrhea, nausea and vomiting.  Neurological: Positive for weakness. Negative for sensory change.   Past Medical History:  Diagnosis Date  . Allergic rhinitis   . Arthritis    "all over"  . CHF (congestive heart failure) ([place])   . Chronic back pain   . DJD (degenerative joint disease)   . GERD (gastroesophageal reflux disease)   . Hyperlipidemia   . Hypertension   . Lumbar stenosis   . OSA (obstructive sleep apnea)    "couldn't handle CPAP; use mouth guard some; not all the time" (01/06/2014)  . PAF (paroxysmal atrial fibrillation) ([place])   . Scoliosis    with radiculopathy  L2-S1 with prior surgery  . Small bowel obstruction (HCC)    versus ileus after last bck surgery  . Spondylosis     Social History   Tobacco Use  . Smoking status: Never Smoker  . Smokeless tobacco: Never Used  Substance Use Topics  . Alcohol use: No    Alcohol/week: 0.0 standard drinks    Comment: 02/01/2014 " glasseof wine once in a blue moon; < once/year"  . Drug use: No    Family History  Problem Relation Age of Onset  . Arthritis Mother   . Heart attack Father   . Hypertension Sister   . Lung cancer Sister   . Asthma Sister   .  Allergies Sister    Allergies  Allergen Reactions  . Lyrica [Pregabalin] Other (See Comments)    Felt loopy  . Zocor [Simvastatin] Other (See Comments)    Myalgias  . Relafen [Nabumetone] Rash    OBJECTIVE: Vitals:   12/06/18 0027 12/06/18 0400 12/06/18 0513 12/06/18 0800  BP:  (!) 163/77  (!) 156/74  Pulse:  77  77  Resp:  19  14  Temp: 98 F (36.7 C)  97.9 F (36.6 C) 97.9 F (36.6 C)  TempSrc: Oral  Oral Oral  SpO2:  100%  100%   There is no height or weight on file to calculate BMI.  Physical Exam Constitutional:      Appearance: She is ill-appearing (chronically ill-appearing).  Cardiovascular:     Rate and Rhythm: Normal rate and regular rhythm.  Pulmonary:     Effort: Pulmonary effort is normal.     Breath sounds: Normal breath sounds. No rales.  Abdominal:     General: Abdomen is flat. Bowel sounds are normal.     Palpations: Abdomen is soft.     Tenderness: There is no abdominal tenderness. There is no guarding.  Genitourinary:    Comments: Dark, cloudy colored urine Neurological:     Mental Status: She is alert and oriented to person, place, and time.     Comments: Neurologic exam: Mental status: A&Ox3 Cranial Nerves: II: PERRL III, IV, VI: Extra-occular motions intact bilaterally V, VII: Face symmetric, sensation intact in all 3 divisions  VIII: hearing normal to rubbing fingers bilaterally  IX, X: palate rises symmetrically XI: Head turn and shoulder shrug normal bilaterally  XII: tongue midline  Motor: Strength 0/5 on LLE, 1/5 on RLE,  5/5 on RUE, 5/5 on LUE, Normal tone Sensory: Light touch intact and symmetric bilaterally  Rapid alternating movement test normal. Heel to shin test normal.    Lab Results Lab Results  Component Value Date   WBC 6.0 12/06/2018   HGB 10.5 (L) 12/06/2018   HCT 32.7 (L) 12/06/2018   MCV 85.6 12/06/2018   PLT 147 (L)  12/06/2018    Lab Results  Component Value Date   CREATININE 1.00 12/05/2018   BUN 29 (H) 12/05/2018   NA 137 12/05/2018   K 3.9 12/05/2018   CL 105 12/05/2018   CO2 20 (L) 12/05/2018    Lab Results  Component Value Date   ALT 10 12/04/2018   AST 16 12/04/2018   ALKPHOS 97 12/04/2018   BILITOT 0.8 12/04/2018     Microbiology: Recent Results (from the past 240 hour(s))  Blood culture (routine x 2)     Status: None (Preliminary result)   Collection Time: 12/04/18  8:05 PM   Specimen: BLOOD  Result Value Ref Range Status   Specimen Description BLOOD LEFT  ANTECUBITAL  Final   Special Requests   Final    BOTTLES DRAWN AEROBIC AND ANAEROBIC Blood Culture results may not be optimal due to an excessive volume of blood received in culture bottles   Culture   Final    NO GROWTH 2 DAYS Performed at [facility], [place] [address], [place], [place] [address]    Report Status PENDING  Incomplete  Blood culture (routine x 2)     Status: None (Preliminary result)   Collection Time: 12/04/18  8:07 PM   Specimen: BLOOD  Result Value Ref Range Status   Specimen Description BLOOD RIGHT ANTECUBITAL  Final   Special Requests   Final    BOTTLES DRAWN AEROBIC AND ANAEROBIC Blood Culture results may not be optimal due to an inadequate volume of blood received in culture bottles   Culture   Final    NO GROWTH 2 DAYS Performed at [facility], [place] [address] [place], [place] [address]    Report Status PENDING  Incomplete  SARS Coronavirus 2 (CEPHEID - Performed in [facility] hospital lab), Hosp Order     Status: None   Collection Time: 12/05/18 12:30 AM   Specimen: Nasopharyngeal Swab  Result Value Ref Range Status   SARS Coronavirus 2 NEGATIVE NEGATIVE Final    Comment: (NOTE) If result is NEGATIVE SARS-CoV-2 target nucleic acids are NOT DETECTED. The SARS-CoV-2 RNA is generally detectable in upper and lower  respiratory specimens during the acute phase of infection. The  lowest  concentration of SARS-CoV-2 viral copies this assay can detect is 250  copies / mL. A negative result does not preclude SARS-CoV-2 infection  and should not be used as the sole basis for treatment or other  patient management decisions.  A negative result may occur with  improper specimen collection / handling, submission of specimen other  than nasopharyngeal swab, presence of viral mutation(s) within the  areas targeted by this assay, and inadequate number of viral copies  (<250 copies / mL). A negative result must be combined with clinical  observations, patient history, and epidemiological information. If result is POSITIVE SARS-CoV-2 target nucleic acids are DETECTED. The SARS-CoV-2 RNA is generally detectable in upper and lower  respiratory specimens dur ing the acute phase of infection.  Positive  results are indicative of active infection with SARS-CoV-2.  Clinical  correlation with patient history and other diagnostic information is  necessary to determine patient infection status.  Positive results do  not rule out bacterial infection or co-infection with other viruses. If result is PRESUMPTIVE POSTIVE SARS-CoV-2 nucleic acids MAY BE PRESENT.   A presumptive positive result was obtained on the submitted specimen  and confirmed on repeat testing.  While 2019 novel coronavirus  (SARS-CoV-2) nucleic acids may be present in the submitted sample  additional confirmatory testing may be necessary for epidemiological  and / or clinical management purposes  to differentiate between  SARS-CoV-2 and other Sarbecovirus currently known to infect humans.  If clinically indicated additional testing with an alternate test  methodology [phone]) is advised. The SARS-CoV-2 RNA is generally  detectable in upper and lower respiratory sp ecimens during the acute  phase of infection. The expected result is Negative. Fact Sheet for Patients:  StrictlyIdeas.no  Fact Sheet for Healthcare Providers: BankingDealers.co.za This test is not yet approved or cleared by the [place] FDA and has been authorized for detection and/or diagnosis of SARS-CoV-2 by FDA under an Emergency Use Authorization (EUA).  This  EUA will remain in effect (meaning this test can be used) for the duration of the COVID-19 declaration under Section 564(b)(1) of the Act, 21 U.S.C. section 360bbb-3(b)(1), unless the authorization is terminated or revoked sooner. Performed at [facility], [place] [address], [place], [place] [address]    Mosetta Anis, [place] [facility] [phone] pager   [phone] cell 12/06/2018, 10:20 AM

## 2018-12-07 ENCOUNTER — Encounter (HOSPITAL_COMMUNITY): Payer: Self-pay | Admitting: Internal Medicine

## 2018-12-07 DIAGNOSIS — N3000 Acute cystitis without hematuria: Secondary | ICD-10-CM

## 2018-12-07 DIAGNOSIS — M549 Dorsalgia, unspecified: Secondary | ICD-10-CM

## 2018-12-07 LAB — LIPASE, BLOOD: Lipase: 59 U/L — ABNORMAL HIGH (ref 11–51)

## 2018-12-07 LAB — PROTIME-INR
INR: 1.8 — ABNORMAL HIGH (ref 0.8–1.2)
Prothrombin Time: 20.5 seconds — ABNORMAL HIGH (ref 11.4–15.2)

## 2018-12-07 MED ORDER — ZINC OXIDE 40 % EX OINT
TOPICAL_OINTMENT | CUTANEOUS | Status: DC | PRN
  Administered 2018-12-07: 1 via TOPICAL
  Filled 2018-12-07 (×2): qty 57

## 2018-12-07 NOTE — Progress Notes (Addendum)
[facility] for Infectious Disease  Date of Admission:  12/04/2018   Total days of antibiotics 2        Day 2 ceftriaxone, doxycycline        Principal Problem:   Spinal stenosis Active Problems:   Epidural abscess   Vertebral osteomyelitis (HCC)   AKI (acute kidney injury) (HCC)   UTI (urinary tract infection)   bisacodyl  10 mg Rectal Daily   carbidopa-levodopa  2 tablet Oral TID   doxycycline  100 mg Oral BID   flecainide  50 mg Oral BID   gabapentin  600 mg Oral BID   mouth rinse  15 mL Mouth Rinse BID   methocarbamol  250 mg Oral BID   metoprolol succinate  100 mg Oral Daily   pantoprazole  40 mg Oral BID   polyethylene glycol  17 g Oral Daily   pramipexole  1 mg Oral QHS   pravastatin  40 mg Oral QHS   ASSESSMENT/Plan [name] is a 78 yo F w/ PMH of DDD, HTN, HLD, a.fib and parkinson's disease admit for acute encephalopathy on hospital day 2.  PLAN: 1. Acute Simple Cystitis: Mentation appears back to baseline. Urine appears more clear. On day 2 of ceftriaxone. Blood culture no growth after 72 hrs. Urine culture collected yesterday but suspect may be negative as it was after antibiotic treatment. - Recommend stopping ceftriaxone after today as she would have received 3 doses. - F/u urine culture  2. Hx of epidural abscess / osteomyelitis: Continuing to endorse lower extremity weakness but can feel some strength today. Neurosurgery would like to avoid surgery if possible due to frailty. The issue for her back appears to be with pain control rather than worsening infection. - C/w doxycycline 100mg  BID  SUBJECTIVE: [name] was examined and evaluated at bedside this AM. She is AAOx3 to name, place and date and able to follow directions. She reports no complaints overnight. She states she wants to drink some coffee but otherwise feels well. Mentions having worsening back pain but alleviated with Norco. Denies any fevers, chills, nausea,  vomiting, dysuria or frequency.   Review of Systems: Review of Systems  Constitutional: Negative for chills, fever and malaise/fatigue.  Respiratory: Negative for shortness of breath.   Cardiovascular: Negative for chest pain.  Gastrointestinal: Negative for nausea and vomiting.  Genitourinary: Negative for dysuria, frequency and urgency.  Musculoskeletal: Positive for back pain.    Past Medical History:  Diagnosis Date   Allergic rhinitis    Arthritis    "all over"   CHF (congestive heart failure) (HCC)    Chronic back pain    DJD (degenerative joint disease)    GERD (gastroesophageal reflux disease)    Hyperlipidemia    Hypertension    Lumbar stenosis    OSA (obstructive sleep apnea)    "couldn't handle CPAP; use mouth guard some; not all the time" (01/06/2014)   PAF (paroxysmal atrial fibrillation) (HCC)    Scoliosis    with radiculopathy L2-S1 with prior surgery   Small bowel obstruction ([place])    versus ileus after last bck surgery   Spondylosis     Social History   Tobacco Use   Smoking status: Never Smoker   Smokeless tobacco: Never Used  Substance Use Topics   Alcohol use: No    Alcohol/week: 0.0 standard drinks    Comment: 02/01/2014 " glasseof wine once in a blue moon; < once/year"   Drug  use: No    Family History  Problem Relation Age of Onset   Arthritis Mother    Heart attack Father    Hypertension Sister    Lung cancer Sister    Asthma Sister    Allergies Sister    Allergies  Allergen Reactions   Lyrica [Pregabalin] Other (See Comments)    Felt loopy   Zocor [Simvastatin] Other (See Comments)    Myalgias   Relafen [Nabumetone] Rash    OBJECTIVE: Vitals:   12/06/18 1625 12/06/18 2000 12/07/18 0000 12/07/18 0744  BP: (!) 140/55  106/76 (!) 176/63  Pulse: 70  69 75  Resp: 15   18  Temp: 97.9 F (36.6 C) 98 F (36.7 C) 98.1 F (36.7 C) 97.7 F (36.5 C)  TempSrc: Oral Oral Oral Oral  SpO2: 100%   100%    There is no height or weight on file to calculate BMI.  Physical Exam Constitutional:      Appearance: She is obese.  Cardiovascular:     Rate and Rhythm: Normal rate and regular rhythm.     Heart sounds: Normal heart sounds.  Pulmonary:     Effort: Pulmonary effort is normal.     Breath sounds: Normal breath sounds. No rales.  Abdominal:     General: Abdomen is flat. Bowel sounds are normal.     Palpations: Abdomen is soft.     Tenderness: There is no abdominal tenderness.  Skin:    General: Skin is warm and dry.  Neurological:     Mental Status: She is alert and oriented to person, place, and time.     Sensory: No sensory deficit.     Motor: Weakness (Bilateral LE 1/5 strength) present.     Coordination: Coordination normal.    Lab Results Lab Results  Component Value Date   WBC 6.0 12/06/2018   HGB 10.5 (L) 12/06/2018   HCT 32.7 (L) 12/06/2018   MCV 85.6 12/06/2018   PLT 147 (L) 12/06/2018    Lab Results  Component Value Date   CREATININE 1.00 12/05/2018   BUN 29 (H) 12/05/2018   NA 137 12/05/2018   K 3.9 12/05/2018   CL 105 12/05/2018   CO2 20 (L) 12/05/2018    Lab Results  Component Value Date   ALT 10 12/04/2018   AST 16 12/04/2018   ALKPHOS 97 12/04/2018   BILITOT 0.8 12/04/2018     Microbiology: Recent Results (from the past 240 hour(s))  Blood culture (routine x 2)     Status: None (Preliminary result)   Collection Time: 12/04/18  8:05 PM   Specimen: BLOOD  Result Value Ref Range Status   Specimen Description BLOOD LEFT ANTECUBITAL  Final   Special Requests   Final    BOTTLES DRAWN AEROBIC AND ANAEROBIC Blood Culture results may not be optimal due to an excessive volume of blood received in culture bottles   Culture   Final    NO GROWTH 3 DAYS Performed at [facility], [place] [address] [place], [place] [address]    Report Status PENDING  Incomplete  Blood culture (routine x 2)     Status: None (Preliminary result)   Collection Time:  12/04/18  8:07 PM   Specimen: BLOOD  Result Value Ref Range Status   Specimen Description BLOOD RIGHT ANTECUBITAL  Final   Special Requests   Final    BOTTLES DRAWN AEROBIC AND ANAEROBIC Blood Culture results may not be optimal due to an inadequate  volume of blood received in culture bottles   Culture   Final    NO GROWTH 3 DAYS Performed at [facility], [place] [address], [place], [place] [address]    Report Status PENDING  Incomplete  SARS Coronavirus 2 (CEPHEID - Performed in [place] hospital lab), Hosp Order     Status: None   Collection Time: 12/05/18 12:30 AM   Specimen: Nasopharyngeal Swab  Result Value Ref Range Status   SARS Coronavirus 2 NEGATIVE NEGATIVE Final    Comment: (NOTE) If result is NEGATIVE SARS-CoV-2 target nucleic acids are NOT DETECTED. The SARS-CoV-2 RNA is generally detectable in upper and lower  respiratory specimens during the acute phase of infection. The lowest  concentration of SARS-CoV-2 viral copies this assay can detect is 250  copies / mL. A negative result does not preclude SARS-CoV-2 infection  and should not be used as the sole basis for treatment or other  patient management decisions.  A negative result may occur with  improper specimen collection / handling, submission of specimen other  than nasopharyngeal swab, presence of viral mutation(s) within the  areas targeted by this assay, and inadequate number of viral copies  (<250 copies / mL). A negative result must be combined with clinical  observations, patient history, and epidemiological information. If result is POSITIVE SARS-CoV-2 target nucleic acids are DETECTED. The SARS-CoV-2 RNA is generally detectable in upper and lower  respiratory specimens dur ing the acute phase of infection.  Positive  results are indicative of active infection with SARS-CoV-2.  Clinical  correlation with patient history and other diagnostic information is  necessary to determine patient infection  status.  Positive results do  not rule out bacterial infection or co-infection with other viruses. If result is PRESUMPTIVE POSTIVE SARS-CoV-2 nucleic acids MAY BE PRESENT.   A presumptive positive result was obtained on the submitted specimen  and confirmed on repeat testing.  While 2019 novel coronavirus  (SARS-CoV-2) nucleic acids may be present in the submitted sample  additional confirmatory testing may be necessary for epidemiological  and / or clinical management purposes  to differentiate between  SARS-CoV-2 and other Sarbecovirus currently known to infect humans.  If clinically indicated additional testing with an alternate test  methodology [phone]) is advised. The SARS-CoV-2 RNA is generally  detectable in upper and lower respiratory sp ecimens during the acute  phase of infection. The expected result is Negative. Fact Sheet for Patients:  StrictlyIdeas.no Fact Sheet for Healthcare Providers: BankingDealers.co.za This test is not yet approved or cleared by the [place] FDA and has been authorized for detection and/or diagnosis of SARS-CoV-2 by FDA under an Emergency Use Authorization (EUA).  This EUA will remain in effect (meaning this test can be used) for the duration of the COVID-19 declaration under Section 564(b)(1) of the Act, 21 U.S.C. section 360bbb-3(b)(1), unless the authorization is terminated or revoked sooner. Performed at [facility], [place] [address] [place], [place] [address]    Mosetta Anis, [place] [facility] [phone] pager   [phone] cell 12/07/2018, 7:55 AM

## 2018-12-07 NOTE — Progress Notes (Signed)
PROGRESS NOTE  SINDHU NGUYEN PZW:[id number] DOB: April 14, 1941 DOA: 12/04/2018 PCP: [name], MD  HPI/Recap of past 24 hours: [name] is a 78 y.o. female with medical history significant of arthritis, DJD, CHF, hypertension, hyperlipidemia, OSA, paroxysmal atrial fibrillation, Patient is a very poor historian and it was difficult to obtain a thorough history from her.  Patient reports having pain all over her body.  She is not sure for how long.  Does state that she has pain in her lower back and bilateral thighs.  She is not sure if she is having any saddle anesthesia or urinary/fecal incontinence.  States she is ambulating at home on her own with the help of a walker.  Denies any fevers, chills, chest pain, shortness of breath, cough, abdominal pain, nausea, or vomiting.  States sometimes in the past she had diarrhea and sometimes constipation, she is not sure if she is having diarrhea or constipation at present.  She is not able to tell me if she is currently taking doxycycline.  No additional history could be obtained from her.  ED Course: Afebrile.  Not tachycardic, tachypneic, or hypotensive.  Not hypoxic.  No leukocytosis.  Lactic acid normal.  INR 1.8.  BUN 32.  Creatinine 1.3, baseline 0.7-0.8.  Lipase 196.  LFTs normal.  CK normal.  UA with positive nitrite, large amount of leukocytes, 21-50 WBCs, and many bacteria.  Urine culture pending.  Blood culture x2 pending.  COVID-19 rapid test pending. Chest x-ray showing no active cardiopulmonary disease. CT head negative for acute finding. CT C-spine negative for acute finding. X-ray of left femur showing no acute osseous abnormality. X-ray of right femur showing no acute osseous abnormality.  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 X-ray of pelvis showing no acute osseous abnormality.  Mild to moderate degenerative osteoarthritis of the hips bilaterally. CT abdomen pelvis showing mild  peripancreatic fat stranding concerning for developing pancreatitis.  Large amount of stool in the rectum with presacral free fluid and rectal wall thickening concerning for developing stercoral colitis. MRI of lumbar spine done December 02, 2018 showing T12-L1 discitis-osteomyelitis with progressive disc space collapse and mildly progressive endplate erosion.  Ventral epidural phlegmon/abscess in the lower thoracic spine appears to have largely resolved, though incompletely evaluated on lumbar MRI.  Increased size of a large right-sided disc extrusion behind the T12 vertebral body with increased severe spinal stenosis.  21 x 6 mm dorsal epidural fluid collection at T12/L1 contributes and may represent abscess or old hematoma. Nursing staff reported that patient has been voiding normally in the ED. No acute urinary retention. Patient received 1 L IV fluid bolus, ceftriaxone, and fentanyl in the ED.  12/07/18: Patient seen and examined at her bedside.  No acute events overnight.  She is alert and oriented x4.  Reports pain in her lower extremities bilaterally worse on the left lower extremity where she is lying.  She has no other complaints.   Assessment/Plan: Principal Problem:   Spinal stenosis Active Problems:   Epidural abscess   Vertebral osteomyelitis (HCC)   AKI (acute kidney injury) ([employer])   UTI (urinary tract infection)  Thoracolumbar vertebral osteomyelitis/epidural abscess/severe spinal stenosis secondary to disc extrusion at the level of T12 vertebrae Recently treated for culture-negative epidural abscess MRI of lumbar spine done December 02, 2018 showing T12-L1 discitis-osteomyelitis with progressive disc space collapse and mildly progressive endplate erosion.  Ventral epidural phlegmon/abscess in the lower thoracic spine appears to have largely resolved, though incompletely  evaluated on lumbar MRI.  Increased size of a large right-sided disc extrusion behind the T12 vertebral body with  increased severe spinal stenosis.  21 x 6 mm dorsal epidural fluid collection at T12/L1 contributes and may represent abscess or old hematoma.   She is afebrile with no leukocytosis CRP 7.1 and sed rate 32 elevated ID and neurosurgery following Continue home doxycycline resumed on 12/06/2018 per ID  Suspected neuropathic pain mostly affecting lower extremities bilaterally likely secondary to severe spinal stenosis. Start gabapentin 300 mg 3 times daily  Resolved acute metabolic encephalopathy likely secondary to UTI Back to her baseline Currently on Rocephin Urine culture in process Blood cultures x2 peripherally negative to date  Mild pancreatitis Lipase 196 on presentation Asymptomatic CT showing mild peripancreatic fat stranding concerning for developing pancreatitis Repeat lipase is pending  AKI, resolved Presented with creatinine of 1.33 Creatinine today 1.0 Avoid nephrotoxins Monitor urine output Repeat BMP in the morning  UTI UA positive for pyuria Continue Rocephin for UTI Urine culture in process  Hypertension Continue Toprol-XL 100 mg daily Monitor vital signs  Stercoral colitis CT showing large amount of stool in the rectum with presacral free fluid and rectal wall thickening concerning for developing stercoral colitis. -Dulcolax suppository scheduled.  Will need enema next if patient continues to be constipated.  Paroxysmal atrial fibrillation Rate currently normal.  INR 1.8.  MRI with epidural fluid collection and concern for possible hematoma. -Hold Coumadin at this time, pending neurosurgery evaluation  Physical debility/ambulatory dysfunction PT OT to assess Fall precautions  DVT prophylaxis: SCDs Code Status: Patient wishes to be full code. Family Communication: No family available. Disposition Plan:  Possible discharge in 2 to 3 days when infectious disease and neurosurgery signed off. Consults called: Neurosurgery, infectious disease.     Objective: Vitals:   12/06/18 1625 12/06/18 2000 12/07/18 0000 12/07/18 0744  BP: (!) 140/55  106/76 (!) 176/63  Pulse: 70  69 75  Resp: 15   18  Temp: 97.9 F (36.6 C) 98 F (36.7 C) 98.1 F (36.7 C) 97.7 F (36.5 C)  TempSrc: Oral Oral Oral Oral  SpO2: 100%   100%    Intake/Output Summary (Last 24 hours) at 12/07/2018 1025 Last data filed at 12/07/2018 0600 Gross per 24 hour  Intake 100 ml  Output 850 ml  Net -750 ml   There were no vitals filed for this visit.  Exam:  . General: 78 y.o. year-old female chronically weak appearing in no acute distress.  Alert and oriented x4.   . Cardiovascular: The rate and rhythm with no rubs or gallops no JVD or thyromegaly.   Marland Kitchen Respiratory: Clear to auscultation with no wheezes or rales.  Good inspiratory effort. . Abdomen: Soft nontender nondistended with normal bowel sounds.. . Musculoskeletal: Trace lower extremity edema bilaterally.  2 out of 4 pulses in all 4 extremities. Marland Kitchen Psychiatry: Mood is appropriate for condition and setting.   Data Reviewed: CBC: Recent Labs  Lab 12/04/18 1940 12/06/18 0451  WBC 9.5 6.0  NEUTROABS 8.1* 4.6  HGB 12.1 10.5*  HCT 39.1 32.7*  MCV 86.3 85.6  PLT 181 741*   Basic Metabolic Panel: Recent Labs  Lab 12/04/18 1940 12/05/18 1125  NA 133* 137  K 3.8 3.9  CL 96* 105  CO2 26 20*  GLUCOSE 94 74  BUN 32* 29*  CREATININE 1.33* 1.00  CALCIUM 9.5 9.1   GFR: CrCl cannot be calculated (Unknown ideal weight.). Liver Function Tests: Recent Labs  Lab  12/04/18 1940  AST 16  ALT 10  ALKPHOS 97  BILITOT 0.8  PROT 6.2*  ALBUMIN 3.4*   Recent Labs  Lab 12/04/18 1940  LIPASE 196*   No results for input(s): AMMONIA in the last 168 hours. Coagulation Profile: Recent Labs  Lab 12/04/18 1940 12/06/18 0451 12/07/18 0604  INR 1.8* 2.0* 1.8*   Cardiac Enzymes: Recent Labs  Lab 12/04/18 1940  CKTOTAL 56   BNP (last 3 results) No results for input(s): PROBNP in the last 8760  hours. HbA1C: No results for input(s): HGBA1C in the last 72 hours. CBG: No results for input(s): GLUCAP in the last 168 hours. Lipid Profile: No results for input(s): CHOL, HDL, LDLCALC, TRIG, CHOLHDL, LDLDIRECT in the last 72 hours. Thyroid Function Tests: No results for input(s): TSH, T4TOTAL, FREET4, T3FREE, THYROIDAB in the last 72 hours. Anemia Panel: No results for input(s): VITAMINB12, FOLATE, FERRITIN, TIBC, IRON, RETICCTPCT in the last 72 hours. Urine analysis:    Component Value Date/Time   COLORURINE YELLOW 12/04/2018 [place] 12/04/2018 2341   LABSPEC 1.018 12/04/2018 2341   PHURINE 5.0 12/04/2018 2341   GLUCOSEU NEGATIVE 12/04/2018 2341   HGBUR SMALL (A) 12/04/2018 2341   BILIRUBINUR NEGATIVE 12/04/2018 2341   KETONESUR NEGATIVE 12/04/2018 2341   PROTEINUR NEGATIVE 12/04/2018 2341   UROBILINOGEN 1.0 06/26/2010 1009   NITRITE POSITIVE (A) 12/04/2018 2341   LEUKOCYTESUR LARGE (A) 12/04/2018 2341   Sepsis Labs: @LABRCNTIP (procalcitonin:4,lacticidven:4)  ) Recent Results (from the past 240 hour(s))  Blood culture (routine x 2)     Status: None (Preliminary result)   Collection Time: 12/04/18  8:05 PM   Specimen: BLOOD  Result Value Ref Range Status   Specimen Description BLOOD LEFT ANTECUBITAL  Final   Special Requests   Final    BOTTLES DRAWN AEROBIC AND ANAEROBIC Blood Culture results may not be optimal due to an excessive volume of blood received in culture bottles   Culture   Final    NO GROWTH 3 DAYS Performed at [facility], [place] [address] [place], [place] [address]    Report Status PENDING  Incomplete  Blood culture (routine x 2)     Status: None (Preliminary result)   Collection Time: 12/04/18  8:07 PM   Specimen: BLOOD  Result Value Ref Range Status   Specimen Description BLOOD RIGHT ANTECUBITAL  Final   Special Requests   Final    BOTTLES DRAWN AEROBIC AND ANAEROBIC Blood Culture results may not be optimal due to an  inadequate volume of blood received in culture bottles   Culture   Final    NO GROWTH 3 DAYS Performed at [facility], [place] [address] [place], [place] [address]    Report Status PENDING  Incomplete  SARS Coronavirus 2 (CEPHEID - Performed in [place] hospital lab), Hosp Order     Status: None   Collection Time: 12/05/18 12:30 AM   Specimen: Nasopharyngeal Swab  Result Value Ref Range Status   SARS Coronavirus 2 NEGATIVE NEGATIVE Final    Comment: (NOTE) If result is NEGATIVE SARS-CoV-2 target nucleic acids are NOT DETECTED. The SARS-CoV-2 RNA is generally detectable in upper and lower  respiratory specimens during the acute phase of infection. The lowest  concentration of SARS-CoV-2 viral copies this assay can detect is 250  copies / mL. A negative result does not preclude SARS-CoV-2 infection  and should not be used as the sole basis for treatment or other  patient  management decisions.  A negative result may occur with  improper specimen collection / handling, submission of specimen other  than nasopharyngeal swab, presence of viral mutation(s) within the  areas targeted by this assay, and inadequate number of viral copies  (<250 copies / mL). A negative result must be combined with clinical  observations, patient history, and epidemiological information. If result is POSITIVE SARS-CoV-2 target nucleic acids are DETECTED. The SARS-CoV-2 RNA is generally detectable in upper and lower  respiratory specimens dur ing the acute phase of infection.  Positive  results are indicative of active infection with SARS-CoV-2.  Clinical  correlation with patient history and other diagnostic information is  necessary to determine patient infection status.  Positive results do  not rule out bacterial infection or co-infection with other viruses. If result is PRESUMPTIVE POSTIVE SARS-CoV-2 nucleic acids MAY BE PRESENT.   A presumptive positive result was obtained on the submitted  specimen  and confirmed on repeat testing.  While 2019 novel coronavirus  (SARS-CoV-2) nucleic acids may be present in the submitted sample  additional confirmatory testing may be necessary for epidemiological  and / or clinical management purposes  to differentiate between  SARS-CoV-2 and other Sarbecovirus currently known to infect humans.  If clinically indicated additional testing with an alternate test  methodology [phone]) is advised. The SARS-CoV-2 RNA is generally  detectable in upper and lower respiratory sp ecimens during the acute  phase of infection. The expected result is Negative. Fact Sheet for Patients:  StrictlyIdeas.no Fact Sheet for Healthcare Providers: BankingDealers.co.za This test is not yet approved or cleared by the [place] FDA and has been authorized for detection and/or diagnosis of SARS-CoV-2 by FDA under an Emergency Use Authorization (EUA).  This EUA will remain in effect (meaning this test can be used) for the duration of the COVID-19 declaration under Section 564(b)(1) of the Act, 21 U.S.C. section 360bbb-3(b)(1), unless the authorization is terminated or revoked sooner. Performed at [facility], [place] [address], [place], [place] [address]       Studies: No results found.  Scheduled Meds: . bisacodyl  10 mg Rectal Daily  . carbidopa-levodopa  2 tablet Oral TID  . doxycycline  100 mg Oral BID  . flecainide  50 mg Oral BID  . gabapentin  600 mg Oral BID  . mouth rinse  15 mL Mouth Rinse BID  . methocarbamol  250 mg Oral BID  . metoprolol succinate  100 mg Oral Daily  . pantoprazole  40 mg Oral BID  . polyethylene glycol  17 g Oral Daily  . pramipexole  1 mg Oral QHS  . pravastatin  40 mg Oral QHS    Continuous Infusions: . cefTRIAXone (ROCEPHIN)  IV Stopped (12/07/18 0151)     LOS: 2 days     Kayleen Memos, MD [facility] Pager [phone]  If 7PM-7AM, please  contact night-coverage [email or web address] Password [facility] 12/07/2018, 10:25 AM

## 2018-12-08 ENCOUNTER — Inpatient Hospital Stay (HOSPITAL_COMMUNITY): Payer: Medicare Other

## 2018-12-08 DIAGNOSIS — I639 Cerebral infarction, unspecified: Secondary | ICD-10-CM

## 2018-12-08 LAB — CBC WITH DIFFERENTIAL/PLATELET
Abs Immature Granulocytes: 0.02 10*3/uL (ref 0.00–0.07)
Basophils Absolute: 0 10*3/uL (ref 0.0–0.1)
Basophils Relative: 0 %
Eosinophils Absolute: 0.1 10*3/uL (ref 0.0–0.5)
Eosinophils Relative: 2 %
HCT: 32.7 % — ABNORMAL LOW (ref 36.0–46.0)
Hemoglobin: 10.3 g/dL — ABNORMAL LOW (ref 12.0–15.0)
Immature Granulocytes: 0 %
Lymphocytes Relative: 9 %
Lymphs Abs: 0.5 10*3/uL — ABNORMAL LOW (ref 0.7–4.0)
MCH: 27.1 pg (ref 26.0–34.0)
MCHC: 31.5 g/dL (ref 30.0–36.0)
MCV: 86.1 fL (ref 80.0–100.0)
Monocytes Absolute: 0.4 10*3/uL (ref 0.1–1.0)
Monocytes Relative: 8 %
Neutro Abs: 4.1 10*3/uL (ref 1.7–7.7)
Neutrophils Relative %: 81 %
Platelets: 151 10*3/uL (ref 150–400)
RBC: 3.8 MIL/uL — ABNORMAL LOW (ref 3.87–5.11)
RDW: 15.9 % — ABNORMAL HIGH (ref 11.5–15.5)
WBC: 5.1 10*3/uL (ref 4.0–10.5)
nRBC: 0 % (ref 0.0–0.2)

## 2018-12-08 LAB — URINE CULTURE: Culture: >=100000 — AB

## 2018-12-08 LAB — PROTIME-INR
INR: 1.7 — ABNORMAL HIGH (ref 0.8–1.2)
Prothrombin Time: 20.1 seconds — ABNORMAL HIGH (ref 11.4–15.2)

## 2018-12-08 LAB — COMPREHENSIVE METABOLIC PANEL
ALT: 6 U/L (ref 0–44)
AST: 14 U/L — ABNORMAL LOW (ref 15–41)
Albumin: 2.8 g/dL — ABNORMAL LOW (ref 3.5–5.0)
Alkaline Phosphatase: 82 U/L (ref 38–126)
Anion gap: 8 (ref 5–15)
BUN: 14 mg/dL (ref 8–23)
CO2: 25 mmol/L (ref 22–32)
Calcium: 8.9 mg/dL (ref 8.9–10.3)
Chloride: 106 mmol/L (ref 98–111)
Creatinine, Ser: 0.81 mg/dL (ref 0.44–1.00)
GFR calc Af Amer: >60 mL/min (ref >60)
GFR calc non Af Amer: >60 mL/min (ref >60)
Glucose, Bld: 95 mg/dL (ref 70–99)
Potassium: 3.7 mmol/L (ref 3.5–5.1)
Sodium: 139 mmol/L (ref 135–145)
Total Bilirubin: 0.8 mg/dL (ref 0.3–1.2)
Total Protein: 5.6 g/dL — ABNORMAL LOW (ref 6.5–8.1)

## 2018-12-08 LAB — LIPASE, BLOOD: Lipase: 84 U/L — ABNORMAL HIGH (ref 11–51)

## 2018-12-08 LAB — MRSA PCR SCREENING: MRSA by PCR: NEGATIVE

## 2018-12-08 NOTE — Progress Notes (Signed)
Bilateral lower extremity venous duplex completed. Preliminary results in Chart review CV proc, [place] Freddy Spadafora,RVS 12/08/2018, 3:38 PM

## 2018-12-08 NOTE — Progress Notes (Signed)
Patient confused, yelling out and crying stating that she doesn't know where she is at.  Daughter called and she spoke with patient to help reorient her and patient was able to calm down after speaking on phone with daughter for almost an hour.

## 2018-12-08 NOTE — Progress Notes (Signed)
PROGRESS NOTE  [name]:[id number] DOB: 14-Sep-1940 DOA: 12/04/2018 PCP: [name], MD  HPI/Recap of past 24 hours: [name] is a 78 y.o. female with medical history significant of arthritis, DJD, CHF, hypertension, hyperlipidemia, OSA, paroxysmal atrial fibrillation, Patient is a very poor historian and it was difficult to obtain a thorough history from her.  Patient reports having pain all over her body.  She is not sure for how long.  Does state that she has pain in her lower back and bilateral thighs.  She is not sure if she is having any saddle anesthesia or urinary/fecal incontinence.  States she is ambulating at home on her own with the help of a walker.  Denies any fevers, chills, chest pain, shortness of breath, cough, abdominal pain, nausea, or vomiting.  States sometimes in the past she had diarrhea and sometimes constipation, she is not sure if she is having diarrhea or constipation at present.  She is not able to tell me if she is currently taking doxycycline.  No additional history could be obtained from her.  ED Course: Afebrile.  Not tachycardic, tachypneic, or hypotensive.  Not hypoxic.  No leukocytosis.  Lactic acid normal.  INR 1.8.  BUN 32.  Creatinine 1.3, baseline 0.7-0.8.  Lipase 196.  LFTs normal.  CK normal.  UA with positive nitrite, large amount of leukocytes, 21-50 WBCs, and many bacteria.  Urine culture pending.  Blood culture x2 pending.  COVID-19 rapid test pending. Chest x-ray showing no active cardiopulmonary disease. CT head negative for acute finding. CT C-spine negative for acute finding. X-ray of left femur showing no acute osseous abnormality. X-ray of right femur showing no acute osseous abnormality.  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 X-ray of pelvis showing no acute osseous abnormality.  Mild to moderate degenerative osteoarthritis of the hips bilaterally. CT abdomen pelvis showing mild  peripancreatic fat stranding concerning for developing pancreatitis.  Large amount of stool in the rectum with presacral free fluid and rectal wall thickening concerning for developing stercoral colitis. MRI of lumbar spine done December 02, 2018 showing T12-L1 discitis-osteomyelitis with progressive disc space collapse and mildly progressive endplate erosion.  Ventral epidural phlegmon/abscess in the lower thoracic spine appears to have largely resolved, though incompletely evaluated on lumbar MRI.  Increased size of a large right-sided disc extrusion behind the T12 vertebral body with increased severe spinal stenosis.  21 x 6 mm dorsal epidural fluid collection at T12/L1 contributes and may represent abscess or old hematoma. Nursing staff reported that patient has been voiding normally in the ED. No acute urinary retention. Patient received 1 L IV fluid bolus, ceftriaxone, and fentanyl in the ED.  12/08/18: Patient seen and examined at her bedside.  She is alert and answering questions appropriately.  She reports pain in her lower extremities bilaterally.  Worse on the left with palpation of her calves.  Will obtain ultrasound to rule out DVT.  She denies chest pain or dyspnea.    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  Ventral epidural phlegmon/abscess in the lower thoracic spine appears to have largely  resolved, though incompletely evaluated on lumbar MRI.  Increased size of a large right-sided disc extrusion behind the T12  vertebral body with increased severe spinal stenosis.  21 x 6 mm dorsal epidural fluid collection at T12/L1 contributes and may represent abscess or old hematoma.   She is afebrile with no leukocytosis CRP 7.1 and sed rate 32 elevated ID and neurosurgery following Continue home doxycycline resumed on 12/06/2018 per ID  Suspected neuropathic pain mostly affecting lower extremities bilaterally likely secondary to severe spinal stenosis. Continue gabapentin 600 mg twice daily.  Persistent bilateral lower extremity pain Tenderness on palpation worse this morning left greater than right Rule out DVT Obtain Doppler ultrasound of lower extremities bilaterally  Pseudomonas UTI, poa Suspected contaminant or colonization per ID Urine culture grew greater than 100,000 colonies of Pseudomonas aeruginosa, awaiting sensitivities Stop Rocephin per ID Continue doxycycline Blood cultures drawn on 12/04/2018 no growth x4 days.  Resolved acute metabolic encephalopathy likely secondary to Pseudomonas UTI Back to her baseline Currently on Rocephin Urine culture in process Blood cultures x2 peripherally negative to date  Mild pancreatitis Lipase 196 on presentation Asymptomatic CT showing mild peripancreatic fat stranding concerning for developing pancreatitis Repeat lipase is pending  AKI, resolved Presented with creatinine of 1.33 Creatinine today 1.0 Avoid nephrotoxins Monitor urine output Repeat BMP in the morning  Hypertension Blood pressure is normotensive Continue Toprol-XL 100 mg daily Continue to monitor vital signs   Stercoral colitis CT showing large amount of stool in the rectum with presacral free fluid and rectal wall thickening concerning for developing stercoral colitis. -Dulcolax suppository scheduled.  Will need enema next if patient continues to be constipated.  Paroxysmal atrial fibrillation Rate controlled on Toprol-XL INR subtherapeutic at 1.7.   Coumadin on hold  pending neurosurgery evaluation  Physical debility/ambulatory dysfunction PT OT to assess Fall precautions  DVT prophylaxis: SCDs Code Status: Patient wishes to be full code. Family Communication: No family available. Disposition Plan:  Possible discharge in 1 to 2 days when infectious disease and neurosurgery signed off.  Consults called: Neurosurgery, infectious disease.    Objective: Vitals:   12/07/18 2015 12/08/18 0010 12/08/18 0400 12/08/18 0800  BP: (!) 104/53 (!) 99/54 (!) 118/51 140/65  Pulse:  (!) 54 66 66  Resp: 15 12 17 16   Temp:  98 F (36.7 C) 98.5 F (36.9 C) 97.9 F (36.6 C)  TempSrc:  Axillary Axillary Oral  SpO2: 100% 97% 100% 100%    Intake/Output Summary (Last 24 hours) at 12/08/2018 0851 Last data filed at 12/08/2018 0400 Gross per 24 hour  Intake 1920 ml  Output 900 ml  Net 1020 ml   There were no vitals filed for this visit.  Exam:  . General: 78 y.o. year-old female well-developed well-nourished she appears uncomfortable due to pain in her legs bilaterally.  She is alert and oriented x3.  Marland Kitchen Respiratory: Clear to auscultation with no wheezes or rales.  Poor inspiratory effort.. . Abdomen: Soft nontender nondistended with normal bowel sounds.  Musculoskeletal: Trace lower extremity edema bilaterally.  Patient is tender on palpation of the calves bilaterally worse on the left.  2 out of 4 pulses in all 4 extremities. Marland Kitchen Psychiatry: Mood is appropriate for condition and setting.   Data Reviewed: CBC: Recent Labs  Lab 12/04/18 1940 12/06/18 0451 12/08/18 0537  WBC 9.5 6.0 5.1  NEUTROABS 8.1* 4.6 4.1  HGB 12.1 10.5* 10.3*  HCT 39.1 32.7* 32.7*  MCV 86.3 85.6 86.1  PLT 181 147* 151  Basic Metabolic Panel: Recent Labs  Lab 12/04/18 1940 12/05/18 1125 12/08/18 0537  NA 133* 137 139  K 3.8 3.9 3.7  CL 96* 105 106  CO2 26 20* 25  GLUCOSE 94 74 95  BUN 32* 29* 14  CREATININE 1.33* 1.00 0.81  CALCIUM 9.5 9.1 8.9   GFR: CrCl cannot  be calculated (Unknown ideal weight.). Liver Function Tests: Recent Labs  Lab 12/04/18 1940 12/08/18 0537  AST 16 14*  ALT 10 6  ALKPHOS 97 82  BILITOT 0.8 0.8  PROT 6.2* 5.6*  ALBUMIN 3.4* 2.8*   Recent Labs  Lab 12/04/18 1940 12/08/18 0537  LIPASE 196* 84*   No results for input(s): AMMONIA in the last 168 hours. Coagulation Profile: Recent Labs  Lab 12/04/18 1940 12/06/18 0451 12/07/18 0604 12/08/18 0537  INR 1.8* 2.0* 1.8* 1.7*   Cardiac Enzymes: Recent Labs  Lab 12/04/18 1940  CKTOTAL 56   BNP (last 3 results) No results for input(s): PROBNP in the last 8760 hours. HbA1C: No results for input(s): HGBA1C in the last 72 hours. CBG: No results for input(s): GLUCAP in the last 168 hours. Lipid Profile: No results for input(s): CHOL, HDL, LDLCALC, TRIG, CHOLHDL, LDLDIRECT in the last 72 hours. Thyroid Function Tests: No results for input(s): TSH, T4TOTAL, FREET4, T3FREE, THYROIDAB in the last 72 hours. Anemia Panel: No results for input(s): VITAMINB12, FOLATE, FERRITIN, TIBC, IRON, RETICCTPCT in the last 72 hours. Urine analysis:    Component Value Date/Time   COLORURINE YELLOW 12/04/2018 [place] 12/04/2018 2341   LABSPEC 1.018 12/04/2018 2341   PHURINE 5.0 12/04/2018 2341   GLUCOSEU NEGATIVE 12/04/2018 2341   HGBUR SMALL (A) 12/04/2018 2341   BILIRUBINUR NEGATIVE 12/04/2018 2341   KETONESUR NEGATIVE 12/04/2018 2341   PROTEINUR NEGATIVE 12/04/2018 2341   UROBILINOGEN 1.0 06/26/2010 1009   NITRITE POSITIVE (A) 12/04/2018 2341   LEUKOCYTESUR LARGE (A) 12/04/2018 2341   Sepsis Labs: @LABRCNTIP (procalcitonin:4,lacticidven:4)  ) Recent Results (from the past 240 hour(s))  Blood culture (routine x 2)     Status: None (Preliminary result)   Collection Time: 12/04/18  8:05 PM   Specimen: BLOOD  Result Value Ref Range Status   Specimen Description BLOOD LEFT ANTECUBITAL  Final   Special Requests   Final    BOTTLES DRAWN AEROBIC AND  ANAEROBIC Blood Culture results may not be optimal due to an excessive volume of blood received in culture bottles   Culture   Final    NO GROWTH 4 DAYS Performed at [facility], [place] [address], [place], [place] [address]    Report Status PENDING  Incomplete  Blood culture (routine x 2)     Status: None (Preliminary result)   Collection Time: 12/04/18  8:07 PM   Specimen: BLOOD  Result Value Ref Range Status   Specimen Description BLOOD RIGHT ANTECUBITAL  Final   Special Requests   Final    BOTTLES DRAWN AEROBIC AND ANAEROBIC Blood Culture results may not be optimal due to an inadequate volume of blood received in culture bottles   Culture   Final    NO GROWTH 4 DAYS Performed at [facility], [place] [address] [place], [place] [address]    Report Status PENDING  Incomplete  SARS Coronavirus 2 (CEPHEID - Performed in [place] hospital lab), Hosp Order     Status: None   Collection Time: 12/05/18 12:30 AM   Specimen: Nasopharyngeal Swab  Result Value Ref Range Status  SARS Coronavirus 2 NEGATIVE NEGATIVE Final    Comment: (NOTE) If result is NEGATIVE SARS-CoV-2 target nucleic acids are NOT DETECTED. The SARS-CoV-2 RNA is generally detectable in upper and lower  respiratory specimens during the acute phase of infection. The lowest  concentration of SARS-CoV-2 viral copies this assay can detect is 250  copies / mL. A negative result does not preclude SARS-CoV-2 infection  and should not be used as the sole basis for treatment or other  patient management decisions.  A negative result may occur with  improper specimen collection / handling, submission of specimen other  than nasopharyngeal swab, presence of viral mutation(s) within the  areas targeted by this assay, and inadequate number of viral copies  (<250 copies / mL). A negative result must be combined with clinical  observations, patient history, and epidemiological information. If result is POSITIVE SARS-CoV-2  target nucleic acids are DETECTED. The SARS-CoV-2 RNA is generally detectable in upper and lower  respiratory specimens dur ing the acute phase of infection.  Positive  results are indicative of active infection with SARS-CoV-2.  Clinical  correlation with patient history and other diagnostic information is  necessary to determine patient infection status.  Positive results do  not rule out bacterial infection or co-infection with other viruses. If result is PRESUMPTIVE POSTIVE SARS-CoV-2 nucleic acids MAY BE PRESENT.   A presumptive positive result was obtained on the submitted specimen  and confirmed on repeat testing.  While 2019 novel coronavirus  (SARS-CoV-2) nucleic acids may be present in the submitted sample  additional confirmatory testing may be necessary for epidemiological  and / or clinical management purposes  to differentiate between  SARS-CoV-2 and other Sarbecovirus currently known to infect humans.  If clinically indicated additional testing with an alternate test  methodology [phone]) is advised. The SARS-CoV-2 RNA is generally  detectable in upper and lower respiratory sp ecimens during the acute  phase of infection. The expected result is Negative. Fact Sheet for Patients:  StrictlyIdeas.no Fact Sheet for Healthcare Providers: BankingDealers.co.za This test is not yet approved or cleared by the [place] FDA and has been authorized for detection and/or diagnosis of SARS-CoV-2 by FDA under an Emergency Use Authorization (EUA).  This EUA will remain in effect (meaning this test can be used) for the duration of the COVID-19 declaration under Section 564(b)(1) of the Act, 21 U.S.C. section 360bbb-3(b)(1), unless the authorization is terminated or revoked sooner. Performed at [facility], [place] [address] [place], [place] [address]   Culture, Urine     Status: Abnormal   Collection Time: 12/06/18  7:09 PM    Specimen: Urine, Catheterized  Result Value Ref Range Status   Specimen Description URINE, CATHETERIZED  Final   Special Requests   Final    NONE Performed at [facility], [place] [address], [place], [place] [address]    Culture >=100,000 COLONIES/mL PSEUDOMONAS AERUGINOSA (A)  Final   Report Status 12/08/2018 FINAL  Final   Organism ID, Bacteria PSEUDOMONAS AERUGINOSA (A)  Final      Susceptibility   Pseudomonas aeruginosa - MIC*    CEFTAZIDIME 16 INTERMEDIATE Intermediate     CIPROFLOXACIN 1 SENSITIVE Sensitive     GENTAMICIN <=1 SENSITIVE Sensitive     IMIPENEM <=0.25 SENSITIVE Sensitive     PIP/TAZO 32 SENSITIVE Sensitive     CEFEPIME INTERMEDIATE Intermediate     * >=100,000 COLONIES/mL PSEUDOMONAS AERUGINOSA      Studies: No results found.  Scheduled Meds: .  bisacodyl  10 mg Rectal Daily  . carbidopa-levodopa  2 tablet Oral TID  . doxycycline  100 mg Oral BID  . flecainide  50 mg Oral BID  . gabapentin  600 mg Oral BID  . mouth rinse  15 mL Mouth Rinse BID  . methocarbamol  250 mg Oral BID  . metoprolol succinate  100 mg Oral Daily  . pantoprazole  40 mg Oral BID  . polyethylene glycol  17 g Oral Daily  . pramipexole  1 mg Oral QHS  . pravastatin  40 mg Oral QHS    Continuous Infusions:    LOS: 3 days     Kayleen Memos, MD [facility] Pager [phone]  If 7PM-7AM, please contact night-coverage [email or web address] Password [email or web address] 12/08/2018, 8:51 AM

## 2018-12-08 NOTE — Progress Notes (Signed)
[facility]ious [facility]  Date of Admission:  12/04/2018   Total days of antibiotics 3        Day 2 doxycycline          Principal Problem:   Spinal stenosis Active Problems:   Vertebral osteomyelitis (HCC)   AKI (acute kidney injury) ([place])   UTI (urinary tract infection)   . bisacodyl  10 mg Rectal Daily  . carbidopa-levodopa  2 tablet Oral TID  . doxycycline  100 mg Oral BID  . flecainide  50 mg Oral BID  . gabapentin  600 mg Oral BID  . mouth rinse  15 mL Mouth Rinse BID  . methocarbamol  250 mg Oral BID  . metoprolol succinate  100 mg Oral Daily  . pantoprazole  40 mg Oral BID  . polyethylene glycol  17 g Oral Daily  . pramipexole  1 mg Oral QHS  . pravastatin  40 mg Oral QHS   ASSESSMENT: Mrs.Thad Ranger is a 78 yo F w/ PMH of DDD, HTN, HLD, a.fib and parkinson's disease admit for acute encephalopathy on hospital day 3.  PLAN: 1. Acute Simple Cystitis: Ceftriaxone d/c after 3 day treatment without worsening symptoms. Urine culture is growing pseudomonas but likely chronic colonizer. Blood culture NGTD. Afebrile overnight.  - Monitor vitals  2. Hx of epidural abscess / osteomyelitis: Right sided left lower extremity weakness appear to have improved but left side is still weak.  - Recommend PT - C/w doxycycline 100mg  BID  ID will sign off, please re-consult if needed  SUBJECTIVE: Mrs.Bonifield was examined and evaluated at bedside this AM. She was AAOx4 and states at this point she has the orientation questions memorized. She believed she was in the hospital for 'weeks.' Despite knowing the date. She mentions episode overnight of 'bad dreams' involving back surgery and being left home alone which caused her to be upset. She denies any fevers, chills, nausea, vomiting diarrhea. She mentions some abdominal discomfort but no dysuria, urgency or frequency. She states she would like to try to work with physical therapy to see if she can get out of bed  because she feels she has been in the bed for too long.  Review of Systems: Review of Systems  Constitutional: Negative for chills, fever and malaise/fatigue.  Eyes: Negative for blurred vision.  Respiratory: Negative for shortness of breath.   Cardiovascular: Negative for chest pain.  Gastrointestinal: Negative for diarrhea, nausea and vomiting.  Genitourinary: Negative for dysuria, frequency and urgency.  Neurological: Positive for weakness. Negative for headaches.    Past Medical History:  Diagnosis Date  . Allergic rhinitis   . Arthritis    "all over"  . CHF (congestive heart failure) ([place])   . Chronic back pain   . DJD (degenerative joint disease)   . 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 GERD (gastroesophageal reflux disease)   . Hyperlipidemia   . Hypertension   . Lumbar stenosis   . OSA (obstructive sleep apnea)    "couldn't handle CPAP; use mouth guard some; not all the time" (01/06/2014)  . PAF (paroxysmal atrial fibrillation) ([place])   . Scoliosis    with radiculopathy L2-S1 with prior surgery  . Small bowel obstruction (HCC)    versus ileus after last bck surgery  . Spondylosis     Social History   Tobacco Use  . Smoking status: Never Smoker  . Smokeless tobacco: Never Used  Substance Use Topics  . Alcohol  use: No    Alcohol/week: 0.0 standard drinks    Comment: 02/01/2014 " glasseof wine once in a blue moon; < once/year"  . Drug use: No    Family History  Problem Relation Age of Onset  . Arthritis Mother   . Heart attack Father   . Hypertension Sister   . Lung cancer Sister   . Asthma Sister   . Allergies Sister    Allergies  Allergen Reactions  . Lyrica [Pregabalin] Other (See Comments)    Felt loopy  . Zocor [Simvastatin] Other (See Comments)    Myalgias  . Relafen [Nabumetone] Rash    OBJECTIVE: Vitals:   12/07/18 2014 12/07/18 2015 12/08/18 0010 12/08/18 0400  BP:  (!) 104/53 (!) 99/54 (!) 118/51  Pulse: 72  (!) 54 66  Resp:  15 12 17   Temp:  98.3 F (36.8 C)  98 F (36.7 C) 98.5 F (36.9 C)  TempSrc: Axillary  Axillary Axillary  SpO2: 100% 100% 97% 100%   There is no height or weight on file to calculate BMI.  Physical Exam Constitutional:      General: She is not in acute distress. Cardiovascular:     Rate and Rhythm: Normal rate and regular rhythm.     Heart sounds: Normal heart sounds.  Pulmonary:     Effort: Pulmonary effort is normal.     Breath sounds: Normal breath sounds. No rales.  Abdominal:     General: Abdomen is flat. Bowel sounds are normal.     Palpations: Abdomen is soft.     Tenderness: There is no abdominal tenderness.  Genitourinary:    Comments: Purewick contained with dark urine Musculoskeletal: Normal range of motion.        General: No swelling.  Neurological:     Mental Status: She is alert and oriented to person, place, and time.     Motor: Weakness (3/5 strenght RLE, 1/5 strength LLE) present.    Lab Results Lab Results  Component Value Date   WBC 5.1 12/08/2018   HGB 10.3 (L) 12/08/2018   HCT 32.7 (L) 12/08/2018   MCV 86.1 12/08/2018   PLT 151 12/08/2018    Lab Results  Component Value Date   CREATININE 0.81 12/08/2018   BUN 14 12/08/2018   NA 139 12/08/2018   K 3.7 12/08/2018   CL 106 12/08/2018   CO2 25 12/08/2018    Lab Results  Component Value Date   ALT 6 12/08/2018   AST 14 (L) 12/08/2018   ALKPHOS 82 12/08/2018   BILITOT 0.8 12/08/2018    Microbiology: Recent Results (from the past 240 hour(s))  Blood culture (routine x 2)     Status: None (Preliminary result)   Collection Time: 12/04/18  8:05 PM   Specimen: BLOOD  Result Value Ref Range Status   Specimen Description BLOOD LEFT ANTECUBITAL  Final   Special Requests   Final    BOTTLES DRAWN AEROBIC AND ANAEROBIC Blood Culture results may not be optimal due to an excessive volume of blood received in culture bottles   Culture   Final    NO GROWTH 3 DAYS Performed at [facility], [place] [address] [place], [place] [address]    Report Status PENDING  Incomplete  Blood culture (routine x 2)     Status: None (Preliminary result)   Collection Time: 12/04/18  8:07 PM   Specimen: BLOOD  Result Value Ref Range Status   Specimen Description BLOOD RIGHT ANTECUBITAL  Final   Special Requests   Final    BOTTLES DRAWN AEROBIC AND ANAEROBIC Blood Culture results may not be optimal due to an inadequate volume of blood received in culture bottles   Culture   Final    NO GROWTH 3 DAYS Performed at [facility], [place] [address] [place], [place] [address]    Report Status PENDING  Incomplete  SARS Coronavirus 2 (CEPHEID - Performed in [place] hospital lab), Hosp Order     Status: None   Collection Time: 12/05/18 12:30 AM   Specimen: Nasopharyngeal Swab  Result Value Ref Range Status   SARS Coronavirus 2 NEGATIVE NEGATIVE Final    Comment: (NOTE) If result is NEGATIVE SARS-CoV-2 target nucleic acids are NOT DETECTED. The SARS-CoV-2 RNA is generally detectable in upper and lower  respiratory specimens during the acute phase of infection. The lowest  concentration of SARS-CoV-2 viral copies this assay can detect is 250  copies / mL. A negative result does not preclude SARS-CoV-2 infection  and should not be used as the sole basis for treatment or other  patient management decisions.  A negative result may occur with  improper specimen collection / handling, submission of specimen other  than nasopharyngeal swab, presence of viral mutation(s) within the  areas targeted by this assay, and inadequate number of viral copies  (<250 copies / mL). A negative result must be combined with clinical  observations, patient history, and epidemiological information. If result is POSITIVE SARS-CoV-2 target nucleic acids are DETECTED. The SARS-CoV-2 RNA is generally detectable in upper and lower  respiratory specimens dur ing the acute phase of infection.  Positive  results are indicative of active  infection with SARS-CoV-2.  Clinical  correlation with patient history and other diagnostic information is  necessary to determine patient infection status.  Positive results do  not rule out bacterial infection or co-infection with other viruses. If result is PRESUMPTIVE POSTIVE SARS-CoV-2 nucleic acids MAY BE PRESENT.   A presumptive positive result was obtained on the submitted specimen  and confirmed on repeat testing.  While 2019 novel coronavirus  (SARS-CoV-2) nucleic acids may be present in the submitted sample  additional confirmatory testing may be necessary for epidemiological  and / or clinical management purposes  to differentiate between  SARS-CoV-2 and other Sarbecovirus currently known to infect humans.  If clinically indicated additional testing with an alternate test  methodology [phone]) is advised. The SARS-CoV-2 RNA is generally  detectable in upper and lower respiratory sp ecimens during the acute  phase of infection. The expected result is Negative. Fact Sheet for Patients:  StrictlyIdeas.no Fact Sheet for Healthcare Providers: BankingDealers.co.za This test is not yet approved or cleared by the [place] FDA and has been authorized for detection and/or diagnosis of SARS-CoV-2 by FDA under an Emergency Use Authorization (EUA).  This EUA will remain in effect (meaning this test can be used) for the duration of the COVID-19 declaration under Section 564(b)(1) of the Act, 21 U.S.C. section 360bbb-3(b)(1), unless the authorization is terminated or revoked sooner. Performed at [facility], [place] [address], [place], [place] [address]   Culture, Urine     Status: Abnormal   Collection Time: 12/06/18  7:09 PM   Specimen: Urine, Catheterized  Result Value Ref Range Status   Specimen Description URINE, CATHETERIZED  Final   Special Requests   Final    NONE Performed at [facility], [place] [address]  [place],  [id number]  Culture >=100,000 COLONIES/mL PSEUDOMONAS AERUGINOSA (A)  Final   Report Status 12/08/2018 FINAL  Final   Organism ID, Bacteria PSEUDOMONAS AERUGINOSA (A)  Final      Susceptibility   Pseudomonas aeruginosa - MIC*    CEFTAZIDIME 16 INTERMEDIATE Intermediate     CIPROFLOXACIN 1 SENSITIVE Sensitive     GENTAMICIN <=1 SENSITIVE Sensitive     IMIPENEM <=0.25 SENSITIVE Sensitive     PIP/TAZO 32 SENSITIVE Sensitive     CEFEPIME INTERMEDIATE Intermediate     * >=100,000 COLONIES/mL PSEUDOMONAS AERUGINOSA   Mosetta Anis, [facility] [phone] pager   [phone] cell 12/08/2018, 7:42 AM

## 2018-12-08 NOTE — Progress Notes (Signed)
PT Cancellation Note  Patient Details Name: [name] MRN: [id number] DOB: Nov 19, 1940   Cancelled Treatment:    Reason Eval/Treat Not Completed: Medical issues which prohibited therapy(Waiting on Dopplers to be done at 3 pm per nurse. HOLD today.)   [name] 12/08/2018, 11:15 AM [name],PT Acute Rehabilitation Services Pager:  [phone]  Office:  [phone]

## 2018-12-09 LAB — CULTURE, BLOOD (ROUTINE X 2)
Culture: NO GROWTH
Culture: NO GROWTH

## 2018-12-09 LAB — PROTIME-INR
INR: 1.7 — ABNORMAL HIGH (ref 0.8–1.2)
Prothrombin Time: 19.7 seconds — ABNORMAL HIGH (ref 11.4–15.2)

## 2018-12-09 MED ORDER — METHOCARBAMOL 500 MG PO TABS
500.0000 mg | ORAL_TABLET | Freq: Two times a day (BID) | ORAL | Status: DC
  Administered 2018-12-09 – 2018-12-19 (×20): 500 mg via ORAL
  Filled 2018-12-09 (×20): qty 1

## 2018-12-09 NOTE — Progress Notes (Addendum)
Cognition significantly improved today, some confusion but much closer to her remote baseline compared to more recently. She unfortunately is still having significant radicular and back pain. I explained to her what I think is the likely reason and said I would also discuss it with her family. The only way I see to fix this is to remove that large area of calcified disc and extend her fusion, which is not a small surgery. I explained this to her and that she is very high risk of having a poor outcome. She would like surgery if it has the potential for regaining strength and decreasing pain, which I said is not a guarantee. I will discuss with her family and update accordingly.  Addendum: I talked to both of her daughters (the patient's husband has dementia). They are going to think about it and decide regarding surgery. I can potentially do her surgery Tuesday afternoon or Wednesday afternoon, will discuss with family tomorrow and update plan of care accordingly.

## 2018-12-09 NOTE — Evaluation (Signed)
Physical Therapy Evaluation Patient Details Name: [name] MRN: [id number] DOB: [birth year] Today's Date: 12/09/2018   History of Present Illness  78 y.o. female with prior epidural abscess, now with continued back pain, admission workup notable for UTI and pancreatitis. MRI of lumbar spine done December 02, 2018 showing T12-L1 discitis-osteomyelitis with progressive disc space collapse and mildly progressive endplate erosion. Admitted with vertebral osteo, epidural abscess and spinal stenosis secondary to disc extrusion at T12. PMH including arthritis, DJD, CHF, HTN, hyperlipidemia, OSA, Parkinson's disease, and paroxysmal atrial fibrillation.  Clinical Impression  Patient presents with generalized weakness, confusion, distractibility, impaired balance and impaired mobility s/p above. Pt poor historian and not able to provide PLOF/history. Very distracted by pain and needed max cues to sustain attention. Today, pt requires Max A for bed mobility and total A to attempt standing transfer but unable to get upright despite assist of 2. Used stedy to transfer pt to chair with Max A of 2. Would benefit from SNF to maximize independence and mobility prior to return home. Will follow acutely.    Follow Up Recommendations SNF;Supervision for mobility/OOB;Supervision/Assistance - 24 hour    Equipment Recommendations  Other (comment)(defer)    Recommendations for Other Services       Precautions / Restrictions Precautions Precautions: Fall;Back Precaution Booklet Issued: No Precaution Comments: Reviewed back precautions Restrictions Weight Bearing Restrictions: No      Mobility  Bed Mobility Overal bed mobility: Needs Assistance Bed Mobility: Supine to Sit     Supine to sit: Max assist;HOB elevated     General bed mobility comments: Max A to bringing hips towards EOB and then elevating trunk into upright position  Transfers Overall transfer level: Needs assistance Equipment used:  Rolling walker (2 wheeled) Transfers: Sit to/from Stand Sit to Stand: Total assist;+2 physical assistance;From elevated surface         General transfer comment: Attempted to stand from EOB with use of RW but unable to get fully upright with knees buckling. Total A +2 power up into standing with use of stedy to block bilateral knees. Stood from [employer] with total A.  Ambulation/Gait             General Gait Details: Unable  Stairs            Wheelchair Mobility    Modified Rankin (Stroke Patients Only)       Balance Overall balance assessment: Needs assistance Sitting-balance support: Bilateral upper extremity supported;Feet supported Sitting balance-Leahy Scale: Poor Sitting balance - Comments: Requires UE support sitting EOB, holding on tightly with UEs to anything.   Standing balance support: Bilateral upper extremity supported;During functional activity Standing balance-Leahy Scale: Zero Standing balance comment: Reliant on external support                             Pertinent Vitals/Pain Pain Assessment: Faces Faces Pain Scale: Hurts whole lot Pain Location: Back Pain Descriptors / Indicators: Constant;Discomfort;Grimacing Pain Intervention(s): Monitored during session;Repositioned;Limited activity within patient's tolerance    Home Living Family/patient expects to be discharged to:: Skilled nursing facility Living Arrangements: Spouse/significant other Available Help at Discharge: Family Type of Home: House Home Access: Stairs to enter;Ramped entrance Entrance Stairs-Rails: Right Entrance Stairs-Number of Steps: 4 Home Layout: One level Home Equipment: Clinical cytogeneticist - 2 wheels;Walker - 4 wheels;Cane - single point Additional Comments: Husband does have mild dementia. Daughter lives 1 mile away    Prior Function Level of Independence:  Needs assistance         Comments: Unsure of PLOF to this admission. Pt distracted by pain  and providing inconsistant reports of PLOF     Hand Dominance   Dominant Hand: Right    Extremity/Trunk Assessment   Upper Extremity Assessment Upper Extremity Assessment: Defer to OT evaluation    Lower Extremity Assessment Lower Extremity Assessment: Generalized weakness;Difficult to assess due to impaired cognition(Less movement noted in LLE; no active ankle AROM present LLE. Difficult to assess due to cognition.)    Cervical / Trunk Assessment Cervical / Trunk Assessment: Other exceptions;Kyphotic Cervical / Trunk Exceptions:  T12-L1 discitis-osteomyelitis   Communication   Communication: No difficulties  Cognition Arousal/Alertness: Awake/alert Behavior During Therapy: WFL for tasks assessed/performed Overall Cognitive Status: Impaired/Different from baseline Area of Impairment: Orientation;Attention;Memory;Following commands;Safety/judgement;Awareness;Problem solving                 Orientation Level: Disoriented to;Time Current Attention Level: Focused Memory: Decreased short-term memory Following Commands: Follows one step commands inconsistently;Follows one step commands with increased time Safety/Judgement: Decreased awareness of safety Awareness: Intellectual Problem Solving: Slow processing;Requires verbal cues;Difficulty sequencing General Comments: Pt highly focused on pain. Requiring increased cues for attention to tasks and participation. Increased time and cues required      General Comments General comments (skin integrity, edema, etc.): VSS throughout.    Exercises     Assessment/Plan    PT Assessment Patient needs continued PT services  PT Problem List Decreased strength;Decreased mobility;Decreased safety awareness;Decreased range of motion;Decreased activity tolerance;Decreased cognition;Pain;Decreased balance       PT Treatment Interventions Therapeutic activities;Gait training;Cognitive remediation;Therapeutic exercise;Patient/family  education;Balance training;Functional mobility training;Wheelchair mobility training    PT Goals (Current goals can be found in the Care Plan section)  Acute Rehab PT Goals Patient Stated Goal: "Stop pain" PT Goal Formulation: With patient Time For Goal Achievement: 12/23/18 Potential to Achieve Goals: Fair    Frequency Min 3X/week   Barriers to discharge Decreased caregiver support      Co-evaluation PT/OT/SLP Co-Evaluation/Treatment: Yes Reason for Co-Treatment: Necessary to address cognition/behavior during functional activity;For patient/therapist safety;To address functional/ADL transfers PT goals addressed during session: Mobility/safety with mobility;Balance;Strengthening/ROM OT goals addressed during session: ADL's and self-care       AM-PAC PT "6 Clicks" Mobility  Outcome Measure Help needed turning from your back to your side while in a flat bed without using bedrails?: A Lot Help needed moving from lying on your back to sitting on the side of a flat bed without using bedrails?: A Lot Help needed moving to and from a bed to a chair (including a wheelchair)?: Total Help needed standing up from a chair using your arms (e.g., wheelchair or bedside chair)?: Total Help needed to walk in hospital room?: Total Help needed climbing 3-5 steps with a railing? : Total 6 Click Score: 8    End of Session Equipment Utilized During Treatment: Gait belt Activity Tolerance: Patient limited by pain Patient left: in chair;with call bell/phone within reach;with chair alarm set Nurse Communication: Mobility status(use of stedy to return to bed) PT Visit Diagnosis: Pain;Unsteadiness on feet (R26.81);Muscle weakness (generalized) (M62.81);Difficulty in walking, not elsewhere classified (R26.2) Pain - part of body: (BLEs and back)    Time: 4259-5638 PT Time Calculation (min) (ACUTE ONLY): 27 min   Charges:   PT Evaluation $PT Eval Moderate Complexity: 1 Mod          Wray Kearns, PT, DPT Acute Rehabilitation Services Pager [phone] Office [phone]  Marguarite Arbour A Rashonda Warrior 12/09/2018, 10:09 AM

## 2018-12-09 NOTE — Progress Notes (Signed)
PROGRESS NOTE  [name] NOB:[id number] DOB: 1940/08/18 DOA: 12/04/2018 PCP: [name], MD  HPI/Recap of past 24 hours: [name] is a 78 y.o. female with medical history significant of arthritis, DJD, CHF, hypertension, hyperlipidemia, OSA, paroxysmal atrial fibrillation, Patient is a very poor historian and it was difficult to obtain a thorough history from her.  Patient reports having pain all over her body.  She is not sure for how long.  Does state that she has pain in her lower back and bilateral thighs.  She is not sure if she is having any saddle anesthesia or urinary/fecal incontinence.  States she is ambulating at home on her own with the help of a walker.  Denies any fevers, chills, chest pain, shortness of breath, cough, abdominal pain, nausea, or vomiting.  States sometimes in the past she had diarrhea and sometimes constipation, she is not sure if she is having diarrhea or constipation at present.  She is not able to tell me if she is currently taking doxycycline.  No additional history could be obtained from her.  ED Course: Afebrile.  Not tachycardic, tachypneic, or hypotensive.  Not hypoxic.  No leukocytosis.  Lactic acid normal.  INR 1.8.  BUN 32.  Creatinine 1.3, baseline 0.7-0.8.  Lipase 196.  LFTs normal.  CK normal.  UA with positive nitrite, large amount of leukocytes, 21-50 WBCs, and many bacteria.  Urine culture pending.  Blood culture x2 pending.  COVID-19 rapid test pending. Chest x-ray showing no active cardiopulmonary disease. CT head negative for acute finding. CT C-spine negative for acute finding. X-ray of left femur showing no acute osseous abnormality. X-ray of right femur showing no acute osseous abnormality.  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 X-ray of pelvis showing no acute osseous abnormality.  Mild to moderate degenerative osteoarthritis of the hips bilaterally. CT abdomen pelvis showing mild  peripancreatic fat stranding concerning for developing pancreatitis.  Large amount of stool in the rectum with presacral free fluid and rectal wall thickening concerning for developing stercoral colitis. MRI of lumbar spine done December 02, 2018 showing T12-L1 discitis-osteomyelitis with progressive disc space collapse and mildly progressive endplate erosion.  Ventral epidural phlegmon/abscess in the lower thoracic spine appears to have largely resolved, though incompletely evaluated on lumbar MRI.  Increased size of a large right-sided disc extrusion behind the T12 vertebral body with increased severe spinal stenosis.  21 x 6 mm dorsal epidural fluid collection at T12/L1 contributes and may represent abscess or old hematoma. Nursing staff reported that patient has been voiding normally in the ED. No acute urinary retention. Patient received 1 L IV fluid bolus, ceftriaxone, and fentanyl in the ED.  Severe bilateral lower extremity pain reported on 12/08/2018 for which a Doppler ultrasound of her lower extremity was completed and negative for DVT.   12/08/08: Patient seen and examined at her bedside.  She is alert and oriented x3.  She is concerned about weakness in her lower extremities bilaterally.  PT OT has been ordered to work with her.  Neurosurgery has been contacted to discuss plan of care.  She denies chest pain, palpitations, dyspnea at rest, abdominal pain or nausea.     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  Ventral  epidural phlegmon/abscess in the lower thoracic spine appears to have largely resolved, though incompletely evaluated on lumbar MRI.  Increased size of a large right-sided disc extrusion behind the T12 vertebral body with increased severe spinal stenosis.  21 x 6 mm dorsal epidural fluid collection at T12/L1 contributes and may represent abscess or old hematoma.   She is afebrile with no leukocytosis CRP 7.1 and sed rate 32 elevated Infectious disease signed off on 12/25/2018.  Recommended to continue doxycycline as tail coverage for her recent culture-negative vertebral infection.  Follow-up with Dr. Catalina Antigua on 12/25/2018. Contacting neurosurgery on 12/09/18 to discuss plan of care.  Continue home doxycycline resumed on 12/06/2018 per ID  Suspected neuropathic pain mostly affecting lower extremities bilaterally likely secondary to severe spinal stenosis. Continue gabapentin 600 mg twice daily.  Persistent bilateral lower extremity pain Bilateral lower extremity Doppler ultrasound negative for DVT on 12/08/2018 Robaxin increased to 500 mg twice daily.  Pseudomonas UTI, poa Suspected contaminant or colonization per ID Urine culture grew greater than 100,000 colonies of Pseudomonas aeruginosa, awaiting sensitivities Stop Rocephin per ID Continue doxycycline Blood cultures drawn on 12/04/2018 no growth x5 days.  Resolved acute metabolic encephalopathy likely secondary to Pseudomonas UTI Back to her baseline Urine culture as stated above Blood cultures x2 peripherally negative to date  Mild pancreatitis, asymptomatic Lipase 196 on presentation >> 84 on 12/08/2018 Denies abdominal pain or nausea. CT showing mild peripancreatic fat stranding concerning for developing pancreatitis  AKI, resolved Presented with creatinine of 1.33 Creatinine 0.8 on 12/08/2018 Continue to avoid nephrotoxins Monitor urine output  Hypertension Blood pressure is normotensive Continue Toprol-XL 100 mg daily Continue  to monitor vital signs   Stercoral colitis CT showing large amount of stool in the rectum with presacral free fluid and rectal wall thickening concerning for developing stercoral colitis. -Dulcolax suppository scheduled.  Will need enema next if patient continues to be constipated.  Paroxysmal atrial fibrillation Rate controlled on Toprol-XL INR subtherapeutic 1.7 on 12/09/2018 Defer to neurosurgery to restart Coumadin Contacted neurosurgery on 12/09/2018 to discuss plan of care. Coumadin on hold pending neurosurgery evaluation.  Subtherapeutic INR INR 1 7 on 12/09/2018 Management as stated above  Physical debility/ambulatory dysfunction PT OT to assess, pending recommendations. Fall precautions  DVT prophylaxis: SCDs Code Status: Patient wishes to be full code. Family Communication: No family available. Disposition Plan:  Pending PT OT recommendations.  Plan to discharge when neurosurgery signs off. Consults called: Neurosurgery, infectious disease.    Objective: Vitals:   12/08/18 1208 12/08/18 2011 12/09/18 0018 12/09/18 0412  BP: (!) 132/97 (!) 139/55 (!) 105/51 (!) 125/51  Pulse: 74 67 (!) 59 (!) 58  Resp: 20  13 18   Temp: 98.7 F (37.1 C) 97.7 F (36.5 C) 97.8 F (36.6 C) 97.8 F (36.6 C)  TempSrc: Oral Oral Oral Oral  SpO2: 100% 100% 95% 100%    Intake/Output Summary (Last 24 hours) at 12/09/2018 0859 Last data filed at 12/09/2018 0600 Gross per 24 hour  Intake 240 ml  Output 275 ml  Net -35 ml   There were no vitals filed for this visit.  Exam:   General: 78 y.o. year-old female well-developed well-nourished in no acute distress.  Alert and oriented x4.    Respiratory: Clear to auscultation with no wheezes or rales.  Poor inspiratory effort.  Abdomen: Soft nontender nondistended  normal bowel sounds present.  Musculoskeletal: Trace lower extremity edema.  2 out of 4 pulses in all 4 extremities.    Psychiatry: Mood is anxious.   Data  Reviewed: CBC: Recent Labs  Lab 12/04/18 1940 12/06/18 0451 12/08/18 0537  WBC 9.5 6.0 5.1  NEUTROABS 8.1* 4.6 4.1  HGB 12.1 10.5* 10.3*  HCT 39.1 32.7* 32.7*  MCV 86.3 85.6 86.1  PLT 181 147* 254   Basic Metabolic Panel: Recent Labs  Lab 12/04/18 1940 12/05/18 1125 12/08/18 0537  NA 133* 137 139  K 3.8 3.9 3.7  CL 96* 105 106  CO2 26 20* 25  GLUCOSE 94 74 95  BUN 32* 29* 14  CREATININE 1.33* 1.00 0.81  CALCIUM 9.5 9.1 8.9   GFR: CrCl cannot be calculated (Unknown ideal weight.). Liver Function Tests: Recent Labs  Lab 12/04/18 1940 12/08/18 0537  AST 16 14*  ALT 10 6  ALKPHOS 97 82  BILITOT 0.8 0.8  PROT 6.2* 5.6*  ALBUMIN 3.4* 2.8*   Recent Labs  Lab 12/04/18 1940 12/08/18 0537  LIPASE 196* 84*   No results for input(s): AMMONIA in the last 168 hours. Coagulation Profile: Recent Labs  Lab 12/04/18 1940 12/06/18 0451 12/07/18 0604 12/08/18 0537 12/09/18 0145  INR 1.8* 2.0* 1.8* 1.7* 1.7*   Cardiac Enzymes: Recent Labs  Lab 12/04/18 1940  CKTOTAL 56   BNP (last 3 results) No results for input(s): PROBNP in the last 8760 hours. HbA1C: No results for input(s): HGBA1C in the last 72 hours. CBG: No results for input(s): GLUCAP in the last 168 hours. Lipid Profile: No results for input(s): CHOL, HDL, LDLCALC, TRIG, CHOLHDL, LDLDIRECT in the last 72 hours. Thyroid Function Tests: No results for input(s): TSH, T4TOTAL, FREET4, T3FREE, THYROIDAB in the last 72 hours. Anemia Panel: No results for input(s): VITAMINB12, FOLATE, FERRITIN, TIBC, IRON, RETICCTPCT in the last 72 hours. Urine analysis:    Component Value Date/Time   COLORURINE YELLOW 12/04/2018 [place] 12/04/2018 2341   LABSPEC 1.018 12/04/2018 2341   PHURINE 5.0 12/04/2018 2341   GLUCOSEU NEGATIVE 12/04/2018 2341   HGBUR SMALL (A) 12/04/2018 2341   BILIRUBINUR NEGATIVE 12/04/2018 2341   KETONESUR NEGATIVE 12/04/2018 2341   PROTEINUR NEGATIVE 12/04/2018 2341    UROBILINOGEN 1.0 06/26/2010 1009   NITRITE POSITIVE (A) 12/04/2018 2341   LEUKOCYTESUR LARGE (A) 12/04/2018 2341   Sepsis Labs: @LABRCNTIP (procalcitonin:4,lacticidven:4)  ) Recent Results (from the past 240 hour(s))  Blood culture (routine x 2)     Status: None (Preliminary result)   Collection Time: 12/04/18  8:05 PM   Specimen: BLOOD  Result Value Ref Range Status   Specimen Description BLOOD LEFT ANTECUBITAL  Final   Special Requests   Final    BOTTLES DRAWN AEROBIC AND ANAEROBIC Blood Culture results may not be optimal due to an excessive volume of blood received in culture bottles   Culture   Final    NO GROWTH 4 DAYS Performed at [facility], [place] [address], [place], [place] [address]    Report Status PENDING  Incomplete  Blood culture (routine x 2)     Status: None (Preliminary result)   Collection Time: 12/04/18  8:07 PM   Specimen: BLOOD  Result Value Ref Range Status   Specimen Description BLOOD RIGHT ANTECUBITAL  Final   Special Requests   Final    BOTTLES DRAWN AEROBIC AND ANAEROBIC Blood Culture results may not be optimal due to an inadequate volume of blood  received in culture bottles   Culture   Final    NO GROWTH 4 DAYS Performed at [facility], [place] [address] [place], [place] [address]    Report Status PENDING  Incomplete  SARS Coronavirus 2 (CEPHEID - Performed in [place] hospital lab), Hosp Order     Status: None   Collection Time: 12/05/18 12:30 AM   Specimen: Nasopharyngeal Swab  Result Value Ref Range Status   SARS Coronavirus 2 NEGATIVE NEGATIVE Final    Comment: (NOTE) If result is NEGATIVE SARS-CoV-2 target nucleic acids are NOT DETECTED. The SARS-CoV-2 RNA is generally detectable in upper and lower  respiratory specimens during the acute phase of infection. The lowest  concentration of SARS-CoV-2 viral copies this assay can detect is 250  copies / mL. A negative result does not preclude SARS-CoV-2 infection  and should not be  used as the sole basis for treatment or other  patient management decisions.  A negative result may occur with  improper specimen collection / handling, submission of specimen other  than nasopharyngeal swab, presence of viral mutation(s) within the  areas targeted by this assay, and inadequate number of viral copies  (<250 copies / mL). A negative result must be combined with clinical  observations, patient history, and epidemiological information. If result is POSITIVE SARS-CoV-2 target nucleic acids are DETECTED. The SARS-CoV-2 RNA is generally detectable in upper and lower  respiratory specimens dur ing the acute phase of infection.  Positive  results are indicative of active infection with SARS-CoV-2.  Clinical  correlation with patient history and other diagnostic information is  necessary to determine patient infection status.  Positive results do  not rule out bacterial infection or co-infection with other viruses. If result is PRESUMPTIVE POSTIVE SARS-CoV-2 nucleic acids MAY BE PRESENT.   A presumptive positive result was obtained on the submitted specimen  and confirmed on repeat testing.  While 2019 novel coronavirus  (SARS-CoV-2) nucleic acids may be present in the submitted sample  additional confirmatory testing may be necessary for epidemiological  and / or clinical management purposes  to differentiate between  SARS-CoV-2 and other Sarbecovirus currently known to infect humans.  If clinically indicated additional testing with an alternate test  methodology [phone]) is advised. The SARS-CoV-2 RNA is generally  detectable in upper and lower respiratory sp ecimens during the acute  phase of infection. The expected result is Negative. Fact Sheet for Patients:  StrictlyIdeas.no Fact Sheet for Healthcare Providers: BankingDealers.co.za This test is not yet approved or cleared by the [place] FDA and has been authorized  for detection and/or diagnosis of SARS-CoV-2 by FDA under an Emergency Use Authorization (EUA).  This EUA will remain in effect (meaning this test can be used) for the duration of the COVID-19 declaration under Section 564(b)(1) of the Act, 21 U.S.C. section 360bbb-3(b)(1), unless the authorization is terminated or revoked sooner. Performed at [facility], [place] [address] [place], [place] [address]   Culture, Urine     Status: Abnormal   Collection Time: 12/06/18  7:09 PM   Specimen: Urine, Catheterized  Result Value Ref Range Status   Specimen Description URINE, CATHETERIZED  Final   Special Requests   Final    NONE Performed at [facility], [place] [address] [place], [place] [address]    Culture >=100,000 COLONIES/mL PSEUDOMONAS AERUGINOSA (A)  Final   Report Status 12/08/2018 FINAL  Final   Organism ID, Bacteria PSEUDOMONAS AERUGINOSA (A)  Final  Susceptibility   Pseudomonas aeruginosa - MIC*    CEFTAZIDIME 16 INTERMEDIATE Intermediate     CIPROFLOXACIN 1 SENSITIVE Sensitive     GENTAMICIN <=1 SENSITIVE Sensitive     IMIPENEM <=0.25 SENSITIVE Sensitive     PIP/TAZO 32 SENSITIVE Sensitive     CEFEPIME INTERMEDIATE Intermediate     * >=100,000 COLONIES/mL PSEUDOMONAS AERUGINOSA  MRSA PCR Screening     Status: None   Collection Time: 12/08/18 12:13 PM   Specimen: Nasal Mucosa; Nasopharyngeal  Result Value Ref Range Status   MRSA by PCR NEGATIVE NEGATIVE Final    Comment:        The GeneXpert MRSA Assay (FDA approved for NASAL specimens only), is one component of a comprehensive MRSA colonization surveillance program. It is not intended to diagnose MRSA infection nor to guide or monitor treatment for MRSA infections. Performed at [facility], [place] [address], [place], [place] [address]       Studies: Vas [place] Lower Extremity Venous (dvt)  Result Date: 12/08/2018  Lower Venous Study Indications: Stroke.  Risk Factors: Paroxysmal A-Fib, HTN, HLD,  CHF. Performing Technologist: Toma Copier RVS  Examination Guidelines: A complete evaluation includes B-mode imaging, spectral Doppler, color Doppler, and power Doppler as needed of all accessible portions of each vessel. Bilateral testing is considered an integral part of a complete examination. Limited examinations for reoccurring indications may be performed as noted.  +---------+---------------+---------+-----------+----------+-------+  RIGHT     Compressibility Phasicity Spontaneity Properties Summary  +---------+---------------+---------+-----------+----------+-------+  CFV       Full            Yes       Yes                             +---------+---------------+---------+-----------+----------+-------+  SFJ       Full                                                      +---------+---------------+---------+-----------+----------+-------+  FV Prox   Full            Yes       Yes                             +---------+---------------+---------+-----------+----------+-------+  FV Mid    Full                                                      +---------+---------------+---------+-----------+----------+-------+  FV Distal Full            Yes       Yes                             +---------+---------------+---------+-----------+----------+-------+  PFV       Full            Yes       Yes                             +---------+---------------+---------+-----------+----------+-------+  POP       Full            Yes       Yes                             +---------+---------------+---------+-----------+----------+-------+  PTV       Full                                                      +---------+---------------+---------+-----------+----------+-------+  PERO      Full                                                      +---------+---------------+---------+-----------+----------+-------+   Right Technical Findings: Not visualized segments include Peroneal.   +---------+---------------+---------+-----------+----------+------------------+  LEFT      Compressibility Phasicity Spontaneity Properties Summary             +---------+---------------+---------+-----------+----------+------------------+  CFV       Full            Yes       Yes                                        +---------+---------------+---------+-----------+----------+------------------+  SFJ       Full                                                                 +---------+---------------+---------+-----------+----------+------------------+  FV Prox   Full            Yes       Yes                                        +---------+---------------+---------+-----------+----------+------------------+  FV Mid    Full                                                                 +---------+---------------+---------+-----------+----------+------------------+  FV Distal Full            Yes       Yes                                        +---------+---------------+---------+-----------+----------+------------------+  PFV       Full            Yes       Yes                                        +---------+---------------+---------+-----------+----------+------------------+  POP       Full            Yes       Yes                                        +---------+---------------+---------+-----------+----------+------------------+  PTV       Full                                                                 +---------+---------------+---------+-----------+----------+------------------+  PERO                                                       Difficult to image  +---------+---------------+---------+-----------+----------+------------------+   Left Technical Findings: Not visualized segments include Unable to image all segments of the peroneal.   Summary: Right: There is no evidence of deep vein thrombosis in the lower extremity. No cystic structure found in the popliteal fossa. Left: There is no evidence  of deep vein thrombosis in the lower extremity. However, portions of this examination were limited- see technologist comments above. No cystic structure found in the popliteal fossa. See technical findings listed above  *See table(s) above for measurements and observations.    Preliminary     Scheduled Meds:  bisacodyl  10 mg Rectal Daily   carbidopa-levodopa  2 tablet Oral TID   doxycycline  100 mg Oral BID   flecainide  50 mg Oral BID   gabapentin  600 mg Oral BID   mouth rinse  15 mL Mouth Rinse BID   methocarbamol  500 mg Oral BID   metoprolol succinate  100 mg Oral Daily   pantoprazole  40 mg Oral BID   polyethylene glycol  17 g Oral Daily   pramipexole  1 mg Oral QHS   pravastatin  40 mg Oral QHS    Continuous Infusions:    LOS: 4 days     Kayleen Memos, MD [facility] Pager [phone]  If 7PM-7AM, please contact night-coverage [email or web address] Password [email or web address] 12/09/2018, 8:59 AM

## 2018-12-09 NOTE — Plan of Care (Signed)
  Problem: Clinical Measurements: Goal: Ability to maintain clinical measurements within normal limits will improve Outcome: Progressing   Problem: Pain Managment: Goal: General experience of comfort will improve Outcome: Progressing   

## 2018-12-09 NOTE — Evaluation (Signed)
Occupational Therapy Evaluation Patient Details Name: [name] MRN: [id number] DOB: Mar 02, 1941 Today's Date: 12/09/2018    History of Present Illness 78 y.o. female with prior epidural abscess, now with continued back pain, admission workup notable for UTI. MRI of lumbar spine done December 02, 2018 showing T12-L1 discitis-osteomyelitis with progressive disc space collapse and mildly progressive endplate erosion. PMH including arthritis, DJD, CHF, HTN, hyperlipidemia, OSA, Parkinson's disease, and paroxysmal atrial fibrillation.    Clinical Impression   Upon arrival, pt was supine in bed leaning again bedrail and awake. Pt poor historian and providing inconsistent information about PLOF and home support. Pt currently requiring Mod A for UB ADLs, Total A for LB ADLs, and Total A +2 for functional transfers with use of stedy. Pt presenting with significant pain, poor balance, decreased strength, and cognitive deficits. Pt would benefit from further acute OT to facilitate safe dc. Recommend dc to SNF for further OT to optimize safety, independence with ADLs, and return to PLOF.      Follow Up Recommendations  SNF;Supervision/Assistance - 24 hour    Equipment Recommendations  Other (comment)(Defer to next venue)    Recommendations for Other Services       Precautions / Restrictions Precautions Precautions: Fall;Back Precaution Comments: Reviewed back preceautions Restrictions Weight Bearing Restrictions: No      Mobility Bed Mobility Overal bed mobility: Needs Assistance Bed Mobility: Supine to Sit     Supine to sit: Max assist;HOB elevated     General bed mobility comments: Max A to bringing hips towards EOB and then elevating trunk into upright position  Transfers Overall transfer level: Needs assistance   Transfers: Sit to/from Stand Sit to Stand: Total assist;+2 physical assistance;From elevated surface         General transfer comment: Total A +2 power up into  standing with use od stedy to block bilateral knees.     Balance Overall balance assessment: Needs assistance Sitting-balance support: Bilateral upper extremity supported;Feet supported Sitting balance-Leahy Scale: Poor     Standing balance support: Bilateral upper extremity supported;During functional activity Standing balance-Leahy Scale: Zero Standing balance comment: Reliant on external support                           ADL either performed or assessed with clinical judgement   ADL Overall ADL's : Needs assistance/impaired Eating/Feeding: Set up;Supervision/ safety;Bed level;Sitting   Grooming: Set up;Supervision/safety;Sitting;Bed level   Upper Body Bathing: Moderate assistance;Sitting;Bed level   Lower Body Bathing: Maximal assistance;Bed level;Total assistance   Upper Body Dressing : Moderate assistance;Sitting;Bed level   Lower Body Dressing: Total assistance;Bed level Lower Body Dressing Details (indicate cue type and reason): Total A for donning socks while sitting at EOB Toilet Transfer: +2 for safety/equipment;+2 for physical assistance;Total assistance(sit<>Stand with stedy) Toilet Transfer Details (indicate cue type and reason): Max A +2 for power up into standing and max cues for participation         Functional mobility during ADLs: Maximal assistance;+2 for physical assistance;+2 for safety/equipment(stedy) General ADL Comments: Pt presenting with pain, decreased balance, and poor strength     Vision         Perception     Praxis      Pertinent Vitals/Pain Pain Assessment: Faces Faces Pain Scale: Hurts whole lot Pain Location: Back Pain Descriptors / Indicators: Constant;Discomfort;Grimacing Pain Intervention(s): Monitored during session;Limited activity within patient's tolerance;Repositioned     Hand Dominance Right   Extremity/Trunk Assessment Upper Extremity  Assessment Upper Extremity Assessment: Generalized weakness   Lower  Extremity Assessment Lower Extremity Assessment: Defer to PT evaluation   Cervical / Trunk Assessment Cervical / Trunk Assessment: Other exceptions;Kyphotic Cervical / Trunk Exceptions:  T12-L1 discitis-osteomyelitis    Communication Communication Communication: No difficulties   Cognition Arousal/Alertness: Awake/alert Behavior During Therapy: WFL for tasks assessed/performed Overall Cognitive Status: Impaired/Different from baseline Area of Impairment: Orientation;Attention;Memory;Following commands;Safety/judgement;Awareness;Problem solving                 Orientation Level: Disoriented to;Time Current Attention Level: Focused Memory: Decreased short-term memory Following Commands: Follows one step commands inconsistently;Follows one step commands with increased time Safety/Judgement: Decreased awareness of safety Awareness: Intellectual Problem Solving: Slow processing;Requires verbal cues;Difficulty sequencing General Comments: Pt highly focused on pain. Requiring increased cues for attention to tasks and participation. Increased time and cues required   General Comments  VSS thorughout    Exercises     Shoulder Instructions      Home Living Family/patient expects to be discharged to:: Private residence Living Arrangements: Spouse/significant other Available Help at Discharge: Family Type of Home: House Home Access: Stairs to enter;Ramped entrance Entrance Stairs-Number of Steps: 4 Entrance Stairs-Rails: Right Home Layout: One level     Bathroom Shower/Tub: Teacher, early years/pre: Handicapped height     Home Equipment: Clinical cytogeneticist - 2 wheels;Walker - 4 wheels;Cane - single point   Additional Comments: Husband does have mild dementia. Daughter lives 1 mile away      Prior Functioning/Environment Level of Independence: Needs assistance        Comments: Unsure of PLOF to this admission. Pt distracted by pain and providing  inconsistant reports of PLOF        OT Problem List: Decreased strength;Decreased range of motion;Decreased activity tolerance;Impaired balance (sitting and/or standing);Decreased cognition;Decreased safety awareness;Decreased knowledge of use of DME or AE;Decreased knowledge of precautions;Pain      OT Treatment/Interventions: Self-care/ADL training;Therapeutic exercise;Energy conservation;DME and/or AE instruction;Therapeutic activities;Patient/family education    OT Goals(Current goals can be found in the care plan section) Acute Rehab OT Goals Patient Stated Goal: "Stop pain" OT Goal Formulation: With patient Time For Goal Achievement: 12/23/18 Potential to Achieve Goals: Good  OT Frequency: Min 2X/week   Barriers to D/C:            Co-evaluation PT/OT/SLP Co-Evaluation/Treatment: Yes Reason for Co-Treatment: For patient/therapist safety;To address functional/ADL transfers   OT goals addressed during session: ADL's and self-care      AM-PAC OT "6 Clicks" Daily Activity     Outcome Measure Help from another person eating meals?: A Little Help from another person taking care of personal grooming?: A Little Help from another person toileting, which includes using toliet, bedpan, or urinal?: Total Help from another person bathing (including washing, rinsing, drying)?: A Lot Help from another person to put on and taking off regular upper body clothing?: A Lot Help from another person to put on and taking off regular lower body clothing?: Total 6 Click Score: 12   End of Session Equipment Utilized During Treatment: Gait belt Nurse Communication: Mobility status;Need for lift equipment  Activity Tolerance: Patient limited by pain;Patient limited by fatigue Patient left: in chair;with call bell/phone within reach;with chair alarm set  OT Visit Diagnosis: Other abnormalities of gait and mobility (R26.89);Unsteadiness on feet (R26.81);Muscle weakness (generalized)  (M62.81);Pain Pain - part of body: (BAck)                Time: 0762-2633 OT Time  Calculation (min): 27 min Charges:  OT General Charges $OT Visit: 1 Visit OT Evaluation $OT Eval Moderate Complexity: [place], OTR/L Acute Rehab Pager: [phone] Office: [place] 12/09/2018, 9:00 AM

## 2018-12-09 NOTE — Progress Notes (Signed)
Physical Therapy Treatment Patient Details Name: [name] MRN: [id number] DOB: 07-29-1940 Today's Date: 12/09/2018    History of Present Illness 78 y.o. female with prior epidural abscess, now with continued back pain, admission workup notable for UTI and pancreatitis. MRI of lumbar spine done December 02, 2018 showing T12-L1 discitis-osteomyelitis with progressive disc space collapse and mildly progressive endplate erosion. Admitted with vertebral osteo, epidural abscess and spinal stenosis secondary to disc extrusion at T12. PMH including arthritis, DJD, CHF, HTN, hyperlipidemia, OSA, Parkinson's disease, and paroxysmal atrial fibrillation.    PT Comments    Additional session to assist RN with pt. Pt requiring three person totalA for standing up with use of Denna Haggard for transfer from recliner to bed. Pt with bowel incontinence in chair. Pt not with significant attention or awareness for participation in tasks. Please see earlier evaluation note for more thorough assessment.    Follow Up Recommendations  SNF;Supervision for mobility/OOB;Supervision/Assistance - 24 hour     Equipment Recommendations  Other (comment)(defer)    Recommendations for Other Services       Precautions / Restrictions Precautions Precautions: Fall;Back Precaution Booklet Issued: No Restrictions Weight Bearing Restrictions: No    Mobility  Bed Mobility Overal bed mobility: Needs Assistance Bed Mobility: Sit to Supine     Supine to sit: +2 for physical assistance;Total assist     General bed mobility comments: TotalA + 2 to return to bed, no initiation by pt noted  Transfers Overall transfer level: Needs assistance Equipment used: Ambulation equipment used Transfers: Sit to/from Stand Sit to Stand: Total assist(+3)         General transfer comment: TotalA + 3 for standing up to Stedy x 2, pt not able to sequence or participate  Ambulation/Gait                 Stairs             Wheelchair Mobility    Modified Rankin (Stroke Patients Only)       Balance Overall balance assessment: Needs assistance Sitting-balance support: Bilateral upper extremity supported;Feet supported Sitting balance-Leahy Scale: Poor       Standing balance-Leahy Scale: Zero                              Cognition Arousal/Alertness: Awake/alert Behavior During Therapy: WFL for tasks assessed/performed Overall Cognitive Status: Impaired/Different from baseline Area of Impairment: Attention;Memory;Following commands;Safety/judgement;Awareness;Problem solving                   Current Attention Level: Focused Memory: Decreased short-term memory Following Commands: Follows one step commands inconsistently;Follows one step commands with increased time Safety/Judgement: Decreased awareness of safety Awareness: Intellectual Problem Solving: Slow processing;Requires verbal cues;Difficulty sequencing General Comments: Requiring max cues for attention to tasks      Exercises      General Comments        Pertinent Vitals/Pain Pain Assessment: Faces Faces Pain Scale: Hurts whole lot Pain Location: Back Pain Descriptors / Indicators: Constant;Discomfort;Grimacing;Moaning Pain Intervention(s): Monitored during session;Limited activity within patient's tolerance    Home Living                      Prior Function            PT Goals (current goals can now be found in the care plan section) Acute Rehab PT Goals Potential to Achieve Goals: Fair    Frequency  Min 3X/week      PT Plan Current plan remains appropriate    Co-evaluation              AM-PAC PT "6 Clicks" Mobility   Outcome Measure  Help needed turning from your back to your side while in a flat bed without using bedrails?: Total Help needed moving from lying on your back to sitting on the side of a flat bed without using bedrails?: Total Help needed moving  to and from a bed to a chair (including a wheelchair)?: Total Help needed standing up from a chair using your arms (e.g., wheelchair or bedside chair)?: Total Help needed to walk in hospital room?: Total Help needed climbing 3-5 steps with a railing? : Total 6 Click Score: 6    End of Session Equipment Utilized During Treatment: Gait belt Activity Tolerance: Patient limited by pain Patient left: in bed;with call bell/phone within reach;with nursing/sitter in room Nurse Communication: Mobility status PT Visit Diagnosis: Pain;Unsteadiness on feet (R26.81);Muscle weakness (generalized) (M62.81);Difficulty in walking, not elsewhere classified (R26.2)     Time: 0712-1975 PT Time Calculation (min) (ACUTE ONLY): 8 min  Charges:  $Therapeutic Activity: 8-22 mins                     Ellamae Sia, PT, DPT Acute Rehabilitation Services Pager [phone] Office [phone]    Willy Eddy 12/09/2018, 5:03 PM

## 2018-12-09 NOTE — Plan of Care (Signed)

## 2018-12-10 LAB — PROTIME-INR
INR: 1.5 — ABNORMAL HIGH (ref 0.8–1.2)
Prothrombin Time: 17.7 seconds — ABNORMAL HIGH (ref 11.4–15.2)

## 2018-12-10 NOTE — TOC Initial Note (Signed)
Transition of Care [facility]) - Initial/Assessment Note    Patient Details  Name: [name] MRN: [id number] Date of Birth: 05-Jul-1940  [facility]) CM/SW Contact:    Oretha Milch, LCSW Phone Number: 12/10/2018, 1:41 PM  Clinical Narrative: CSW met with patient to discuss SNF referral. CSW noted patient requested RN to remain in room and call her daughter. CSW facilitated conversation with RN, CSW, patient, and family regarding started the process to refer to a SNF. CSW noted family wanted to make sure patient received the care she needed and noted they were amenable to SNF. CSW noted patient was amenable however concerned regarding a follow-up surgery. CSW informed family he cannot make a prediction of how surgery would impact disposition. CSW noted family and patient agreed to a SNF referral once they resolve their questions about surgery. CSW will continue to follow for disposition support.                 Expected Discharge Plan: Skilled Nursing Facility(Or home health, no agreed upon disposition at this time.) Barriers to Discharge: Continued Medical Work up, Ship broker   Patient Goals and CMS Choice Patient states their goals for this hospitalization and ongoing recovery are:: "I just don't want to feel as much pain."   Choice offered to / list presented to : Patient, Adult Children(Declined, wanted to wait until after decision on surgery is made.)  Expected Discharge Plan and Services Expected Discharge Plan: Skilled Nursing Facility(Or home health, no agreed upon disposition at this time.)       Living arrangements for the past 2 months: Single Family Home                                      Prior Living Arrangements/Services Living arrangements for the past 2 months: Single Family Home Lives with:: Adult Children, Significant Other Patient language and need for interpreter reviewed:: No Do you feel safe going back to the place where  you live?: Yes      Need for Family Participation in Patient Care: Yes (Comment) Care giver support system in place?: Yes (comment) Current home services: Home PT Criminal Activity/Legal Involvement Pertinent to Current Situation/Hospitalization: No - Comment as needed  Activities of Daily Living   ADL Screening (condition at time of admission) Patient's cognitive ability adequate to safely complete daily activities?: No Is the patient deaf or have difficulty hearing?: No Does the patient have difficulty seeing, even when wearing glasses/contacts?: No Does the patient have difficulty concentrating, remembering, or making decisions?: Yes Patient able to express need for assistance with ADLs?: No Does the patient have difficulty dressing or bathing?: Yes Independently performs ADLs?: No Communication: Independent Grooming: Dependent Is this a change from baseline?: Pre-admission baseline Feeding: Dependent Is this a change from baseline?: Pre-admission baseline Bathing: Dependent Is this a change from baseline?: Pre-admission baseline Toileting: Needs assistance, Dependent Is this a change from baseline?: Pre-admission baseline In/Out Bed: Dependent Is this a change from baseline?: Pre-admission baseline Walks in Home: Dependent Is this a change from baseline?: Pre-admission baseline Does the patient have difficulty walking or climbing stairs?: Yes Weakness of Legs: Both Weakness of Arms/Hands: Both  Permission Sought/Granted Permission sought to share information with : Facility Sport and exercise psychologist, Family Supports Permission granted to share information with : Yes, Verbal Permission Granted  Share Information with NAME: Permission given to contact family  Permission granted  to share info w AGENCY: At this time no permission to contact agency contacts and refer patient out        Emotional Assessment Appearance:: Appears older than stated age Attitude/Demeanor/Rapport:  Apprehensive, Inconsistent, Charismatic Affect (typically observed): Afraid/Fearful, Apprehensive Orientation: : Oriented to Self, Oriented to Place Alcohol / Substance Use: Not Applicable Psych Involvement: No (comment)  Admission diagnosis:  Pain [R52] Urinary tract infection without hematuria, site unspecified [N39.0] Back pain, unspecified back location, unspecified back pain laterality, unspecified chronicity [M54.9] Acute pancreatitis, unspecified complication status, unspecified pancreatitis type [K85.90] Patient Active Problem List   Diagnosis Date Noted  . UTI (urinary tract infection) 12/06/2018  . Vertebral osteomyelitis ([place]) 12/05/2018  . Spinal stenosis 12/05/2018  . AKI (acute kidney injury) ([place]) 12/05/2018  . Acute metabolic encephalopathy 58/31/6742  . Chronic diastolic (congestive) heart failure ([place]) 10/07/2018  . GERD (gastroesophageal reflux disease) 10/07/2018  . Abnormal glucose tolerance test 07/01/2018  . Colon, diverticulosis 07/01/2018  . 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 Glaucoma 07/01/2018  . Chronic kidney disease (CKD), stage III (moderate) ([place]) 07/01/2018  . Neuropathy 07/01/2018  . Pure hypercholesterolemia 07/01/2018  . Restless leg 07/01/2018  . Discitis of thoracolumbar region 07/01/2018  . Acute bronchitis due to infection 05/04/2017  . Cough 04/21/2016  . Preoperative cardiovascular examination 04/18/2015  . Parkinsonism ([place]) 05/15/2014  . Chronic anticoagulation -warfarin therapy 02/03/2014  . Atrial fibrillation with RVR ([place]) 01/31/2014  . Gait instability 11/16/2013  . Encounter for therapeutic drug monitoring 07/20/2013  . Edema 03/03/2010  . ATRIAL FIBRILLATION 04/23/2008  . Hyperlipidemia 01/31/2008  . Obstructive sleep apnea 01/31/2008  . GLAUCOMA 01/30/2008  . Seasonal and perennial allergic rhinitis 01/30/2008  . Osteoarthritis 01/30/2008   PCP:  [name], MD Pharmacy:   [facility] [place], [place] [address] Phone: [phone] Fax: [phone]  [facility] [address] [place], [place] [facility] AT [facility] [address] [place] [address] Phone: [phone] Fax: [phone]  [facility] [phone] [place], [place] - [address] [place] [address] [place] [address] Phone: [phone] Fax: [phone]     Social Determinants of Health ([place]) Interventions    Readmission Risk Interventions Readmission Risk Prevention Plan 10/02/2018  Transportation Screening Complete  PCP or Specialist Appt within 5-7 Days Complete  Home Care Screening Complete  Medication Review (RN CM) Complete  Some recent data might be hidden

## 2018-12-10 NOTE — Progress Notes (Signed)
PROGRESS NOTE  [name] TIR:[id number] DOB: 08-11-1940 DOA: 12/04/2018 PCP: [name], MD  HPI/Recap of past 24 hours: [name] is a 78 y.o. female with medical history significant of arthritis, DJD, CHF, hypertension, hyperlipidemia, OSA, paroxysmal atrial fibrillation, Patient is a very poor historian and it was difficult to obtain a thorough history from her.  Patient reports having pain all over her body.  She is not sure for how long.  Does state that she has pain in her lower back and bilateral thighs.  She is not sure if she is having any saddle anesthesia or urinary/fecal incontinence.  States she is ambulating at home on her own with the help of a walker.  Denies any fevers, chills, chest pain, shortness of breath, cough, abdominal pain, nausea, or vomiting.  States sometimes in the past she had diarrhea and sometimes constipation, she is not sure if she is having diarrhea or constipation at present.  She is not able to tell me if she is currently taking doxycycline.  No additional history could be obtained from her.  ED Course: Afebrile.  Not tachycardic, tachypneic, or hypotensive.  Not hypoxic.  No leukocytosis.  Lactic acid normal.  INR 1.8.  BUN 32.  Creatinine 1.3, baseline 0.7-0.8.  Lipase 196.  LFTs normal.  CK normal.  UA with positive nitrite, large amount of leukocytes, 21-50 WBCs, and many bacteria.  Urine culture pending.  Blood culture x2 pending.  COVID-19 rapid test pending. Chest x-ray showing no active cardiopulmonary disease. CT head negative for acute finding. CT C-spine negative for acute finding. X-ray of left femur showing no acute osseous abnormality. X-ray of right femur showing no acute osseous abnormality.  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 X-ray of pelvis showing no acute osseous abnormality.  Mild to moderate degenerative osteoarthritis of the hips bilaterally. CT abdomen pelvis showing mild  peripancreatic fat stranding concerning for developing pancreatitis.  Large amount of stool in the rectum with presacral free fluid and rectal wall thickening concerning for developing stercoral colitis. MRI of lumbar spine done December 02, 2018 showing T12-L1 discitis-osteomyelitis with progressive disc space collapse and mildly progressive endplate erosion.  Ventral epidural phlegmon/abscess in the lower thoracic spine appears to have largely resolved, though incompletely evaluated on lumbar MRI.  Increased size of a large right-sided disc extrusion behind the T12 vertebral body with increased severe spinal stenosis.  21 x 6 mm dorsal epidural fluid collection at T12/L1 contributes and may represent abscess or old hematoma. Nursing staff reported that patient has been voiding normally in the ED. No acute urinary retention. Patient received 1 L IV fluid bolus, ceftriaxone, and fentanyl in the ED.  Severe bilateral lower extremity pain reported on 12/08/2018 for which a Doppler ultrasound of her lower extremity was completed and negative for DVT.   12/09/08: Patient seen and examined.  She was alert and oriented.  The only complaint she had was tingling in the right lower extremity.  No other complaint.   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  Ventral epidural phlegmon/abscess in the lower thoracic spine appears to  have largely resolved, though incompletely evaluated on lumbar MRI.  Increased size of a large right-sided disc  extrusion behind the T12 vertebral body with increased severe spinal stenosis.  21 x 6 mm dorsal epidural fluid collection at T12/L1 contributes and may represent abscess or old hematoma.   She is afebrile with no leukocytosis CRP 7.1 and sed rate 32 elevated Infectious disease signed off on 12/08/2018.  Recommended to continue doxycycline as tail coverage for her recent culture-negative vertebral infection.  Follow-up with Dr. Catalina Antigua on 12/25/2018. Patient discussed possible surgical plan with the neurosurgery yesterday.  Per neurosurgery's note, the surgery could have grave complications and they do not recommend surgery however patient was very adamant on doing that so neurosurgery had called patient's daughters and they are still talking about making a final decision.  Suspected neuropathic pain mostly affecting lower extremities bilaterally likely secondary to severe spinal stenosis. Continue gabapentin 600 mg twice daily.  Persistent bilateral lower extremity pain Bilateral lower extremity Doppler ultrasound negative for DVT on 12/08/2018 Robaxin increased to 500 mg twice daily.  Pseudomonas UTI, poa Suspected contaminant or colonization per ID Urine culture grew greater than 100,000 colonies of Pseudomonas aeruginosa, awaiting sensitivities Rocephin was stopped per ID recommendation. Continue doxycycline Blood cultures drawn on 12/04/2018 no growth x5 days.  Resolved acute metabolic encephalopathy likely secondary to Pseudomonas UTI Back to her baseline Urine culture as stated above Blood cultures x2 peripherally negative to date  Mild pancreatitis, asymptomatic Lipase 196 on presentation >> 84 on 12/08/2018 CT showing mild peripancreatic fat stranding concerning for developing pancreatitis.  This has resolved.  AKI, resolved Presented with creatinine of 1.33 Creatinine 0.8 on 12/08/2018 Continue to avoid nephrotoxins Monitor urine output  Hypertension Blood pressure is  normotensive Continue Toprol-XL 100 mg daily Continue to monitor vital signs   Stercoral colitis CT showing large amount of stool in the rectum with presacral free fluid and rectal wall thickening concerning for developing stercoral colitis however patient has no signs or symptoms suggestive infection. -Dulcolax suppository scheduled.  Paroxysmal atrial fibrillation Rate controlled on Toprol-XL INR subtherapeutic 1.5 today. Defer to neurosurgery to restart Coumadin based on what the plan is going to be about her stenosis which will be surgery versus no surgery. Coumadin on hold pending neurosurgery evaluation.  We will start her on DVT prophylaxis Lovenox in the meantime.  Subtherapeutic INR INR 1.5 today. Management as stated above  Physical debility/ambulatory dysfunction PT OT to assess, pending recommendations. Fall precautions  DVT prophylaxis: SCDs/starting on Lovenox. Code Status: Patient wishes to be full code. Family Communication: No family available. Disposition Plan:  Pending PT OT recommendations.  Plan to discharge when neurosurgery signs off. Consults called: Neurosurgery, infectious disease (signed off on 12/08/2018).    Objective: Vitals:   12/10/18 0122 12/10/18 0427 12/10/18 0913 12/10/18 1233  BP: (!) 115/40 (!) 141/59 (!) 112/46 (!) 104/46  Pulse: 61 66 66 72  Resp: 18 16 18  (!) 50  Temp: 98 F (36.7 C) 97.8 F (36.6 C) 97.6 F (36.4 C) 98.2 F (36.8 C)  TempSrc: Oral Oral Oral Oral  SpO2: 100% 100% 100% 99%    Intake/Output Summary (Last 24 hours) at 12/10/2018 1330 Last data filed at 12/10/2018 0424 Gross per 24 hour  Intake -  Output 650 ml  Net -650 ml   There were no vitals filed for this visit.  Exam:  General exam: Appears calm and comfortable  Respiratory system: Clear to auscultation. Respiratory effort normal. Cardiovascular system: S1 &  S2 heard, RRR. No JVD, murmurs, rubs, gallops or clicks. No pedal edema. Gastrointestinal  system: Abdomen is nondistended, soft and nontender. No organomegaly or masses felt. Normal bowel sounds heard. Central nervous system: Alert and oriented.  Very minimal toe wiggling in both lower extremities.  She is unable to lift her lower extremities Extremities: Symmetric 5 x 5 power in bilateral upper extremities but perhaps 1/5 in both lower extremities Skin: No rashes, lesions or ulcers Psychiatry: Judgement and insight appear poor. Mood & affect appropriate.    Data Reviewed: CBC: Recent Labs  Lab 12/04/18 1940 12/06/18 0451 12/08/18 0537  WBC 9.5 6.0 5.1  NEUTROABS 8.1* 4.6 4.1  HGB 12.1 10.5* 10.3*  HCT 39.1 32.7* 32.7*  MCV 86.3 85.6 86.1  PLT 181 147* 809   Basic Metabolic Panel: Recent Labs  Lab 12/04/18 1940 12/05/18 1125 12/08/18 0537  NA 133* 137 139  K 3.8 3.9 3.7  CL 96* 105 106  CO2 26 20* 25  GLUCOSE 94 74 95  BUN 32* 29* 14  CREATININE 1.33* 1.00 0.81  CALCIUM 9.5 9.1 8.9   GFR: CrCl cannot be calculated (Unknown ideal weight.). Liver Function Tests: Recent Labs  Lab 12/04/18 1940 12/08/18 0537  AST 16 14*  ALT 10 6  ALKPHOS 97 82  BILITOT 0.8 0.8  PROT 6.2* 5.6*  ALBUMIN 3.4* 2.8*   Recent Labs  Lab 12/04/18 1940 12/08/18 0537  LIPASE 196* 84*   No results for input(s): AMMONIA in the last 168 hours. Coagulation Profile: Recent Labs  Lab 12/06/18 0451 12/07/18 0604 12/08/18 0537 12/09/18 0145 12/10/18 0641  INR 2.0* 1.8* 1.7* 1.7* 1.5*   Cardiac Enzymes: Recent Labs  Lab 12/04/18 1940  CKTOTAL 56   BNP (last 3 results) No results for input(s): PROBNP in the last 8760 hours. HbA1C: No results for input(s): HGBA1C in the last 72 hours. CBG: No results for input(s): GLUCAP in the last 168 hours. Lipid Profile: No results for input(s): CHOL, HDL, LDLCALC, TRIG, CHOLHDL, LDLDIRECT in the last 72 hours. Thyroid Function Tests: No results for input(s): TSH, T4TOTAL, FREET4, T3FREE, THYROIDAB in the last 72 hours.  Anemia Panel: No results for input(s): VITAMINB12, FOLATE, FERRITIN, TIBC, IRON, RETICCTPCT in the last 72 hours. Urine analysis:    Component Value Date/Time   COLORURINE YELLOW 12/04/2018 [place] 12/04/2018 2341   LABSPEC 1.018 12/04/2018 2341   PHURINE 5.0 12/04/2018 2341   GLUCOSEU NEGATIVE 12/04/2018 2341   HGBUR SMALL (A) 12/04/2018 2341   BILIRUBINUR NEGATIVE 12/04/2018 2341   KETONESUR NEGATIVE 12/04/2018 2341   PROTEINUR NEGATIVE 12/04/2018 2341   UROBILINOGEN 1.0 06/26/2010 1009   NITRITE POSITIVE (A) 12/04/2018 2341   LEUKOCYTESUR LARGE (A) 12/04/2018 2341   Sepsis Labs: @LABRCNTIP (procalcitonin:4,lacticidven:4)  ) Recent Results (from the past 240 hour(s))  Blood culture (routine x 2)     Status: None   Collection Time: 12/04/18  8:05 PM   Specimen: BLOOD  Result Value Ref Range Status   Specimen Description BLOOD LEFT ANTECUBITAL  Final   Special Requests   Final    BOTTLES DRAWN AEROBIC AND ANAEROBIC Blood Culture results may not be optimal due to an excessive volume of blood received in culture bottles   Culture   Final    NO GROWTH 5 DAYS Performed at [facility], [place] [address] [place], [place] [address]    Report Status 12/09/2018 FINAL  Final  Blood culture (routine x 2)  Status: None   Collection Time: 12/04/18  8:07 PM   Specimen: BLOOD  Result Value Ref Range Status   Specimen Description BLOOD RIGHT ANTECUBITAL  Final   Special Requests   Final    BOTTLES DRAWN AEROBIC AND ANAEROBIC Blood Culture results may not be optimal due to an inadequate volume of blood received in culture bottles   Culture   Final    NO GROWTH 5 DAYS Performed at [facility], [place] [address], [place], [place] [address]    Report Status 12/09/2018 FINAL  Final  SARS Coronavirus 2 (CEPHEID - Performed in [place] hospital lab), Hosp Order     Status: None   Collection Time: 12/05/18 12:30 AM   Specimen: Nasopharyngeal Swab  Result  Value Ref Range Status   SARS Coronavirus 2 NEGATIVE NEGATIVE Final    Comment: (NOTE) If result is NEGATIVE SARS-CoV-2 target nucleic acids are NOT DETECTED. The SARS-CoV-2 RNA is generally detectable in upper and lower  respiratory specimens during the acute phase of infection. The lowest  concentration of SARS-CoV-2 viral copies this assay can detect is 250  copies / mL. A negative result does not preclude SARS-CoV-2 infection  and should not be used as the sole basis for treatment or other  patient management decisions.  A negative result may occur with  improper specimen collection / handling, submission of specimen other  than nasopharyngeal swab, presence of viral mutation(s) within the  areas targeted by this assay, and inadequate number of viral copies  (<250 copies / mL). A negative result must be combined with clinical  observations, patient history, and epidemiological information. If result is POSITIVE SARS-CoV-2 target nucleic acids are DETECTED. The SARS-CoV-2 RNA is generally detectable in upper and lower  respiratory specimens dur ing the acute phase of infection.  Positive  results are indicative of active infection with SARS-CoV-2.  Clinical  correlation with patient history and other diagnostic information is  necessary to determine patient infection status.  Positive results do  not rule out bacterial infection or co-infection with other viruses. If result is PRESUMPTIVE POSTIVE SARS-CoV-2 nucleic acids MAY BE PRESENT.   A presumptive positive result was obtained on the submitted specimen  and confirmed on repeat testing.  While 2019 novel coronavirus  (SARS-CoV-2) nucleic acids may be present in the submitted sample  additional confirmatory testing may be necessary for epidemiological  and / or clinical management purposes  to differentiate between  SARS-CoV-2 and other Sarbecovirus currently known to infect humans.  If clinically indicated additional testing  with an alternate test  methodology [phone]) is advised. The SARS-CoV-2 RNA is generally  detectable in upper and lower respiratory sp ecimens during the acute  phase of infection. The expected result is Negative. Fact Sheet for Patients:  StrictlyIdeas.no Fact Sheet for Healthcare Providers: BankingDealers.co.za This test is not yet approved or cleared by the [place] FDA and has been authorized for detection and/or diagnosis of SARS-CoV-2 by FDA under an Emergency Use Authorization (EUA).  This EUA will remain in effect (meaning this test can be used) for the duration of the COVID-19 declaration under Section 564(b)(1) of the Act, 21 U.S.C. section 360bbb-3(b)(1), unless the authorization is terminated or revoked sooner. Performed at [name], [place] [address] [place], [place] [address]   Culture, Urine     Status: Abnormal   Collection Time: 12/06/18  7:09 PM   Specimen: Urine, Catheterized  Result Value Ref Range Status   Specimen  Description URINE, CATHETERIZED  Final   Special Requests   Final    NONE Performed at [facility], [place] [address], [place], [place] [address]    Culture >=100,000 COLONIES/mL PSEUDOMONAS AERUGINOSA (A)  Final   Report Status 12/08/2018 FINAL  Final   Organism ID, Bacteria PSEUDOMONAS AERUGINOSA (A)  Final      Susceptibility   Pseudomonas aeruginosa - MIC*    CEFTAZIDIME 16 INTERMEDIATE Intermediate     CIPROFLOXACIN 1 SENSITIVE Sensitive     GENTAMICIN <=1 SENSITIVE Sensitive     IMIPENEM <=0.25 SENSITIVE Sensitive     PIP/TAZO 32 SENSITIVE Sensitive     CEFEPIME INTERMEDIATE Intermediate     * >=100,000 COLONIES/mL PSEUDOMONAS AERUGINOSA  MRSA PCR Screening     Status: None   Collection Time: 12/08/18 12:13 PM   Specimen: Nasal Mucosa; Nasopharyngeal  Result Value Ref Range Status   MRSA by PCR NEGATIVE NEGATIVE Final    Comment:        The GeneXpert MRSA Assay (FDA  approved for NASAL specimens only), is one component of a comprehensive MRSA colonization surveillance program. It is not intended to diagnose MRSA infection nor to guide or monitor treatment for MRSA infections. Performed at [facility], [place] [address] [place], [place] [address]       Studies: No results found.  Scheduled Meds: . bisacodyl  10 mg Rectal Daily  . carbidopa-levodopa  2 tablet Oral TID  . doxycycline  100 mg Oral BID  . flecainide  50 mg Oral BID  . gabapentin  600 mg Oral BID  . mouth rinse  15 mL Mouth Rinse BID  . methocarbamol  500 mg Oral BID  . metoprolol succinate  100 mg Oral Daily  . pantoprazole  40 mg Oral BID  . polyethylene glycol  17 g Oral Daily  . pramipexole  1 mg Oral QHS  . pravastatin  40 mg Oral QHS    Continuous Infusions:    LOS: 5 days     Darliss Cheney, MD [facility] Pager [phone]  If 7PM-7AM, please contact night-coverage [email or web address] Password [email or web address] 12/10/2018, 1:30 PM

## 2018-12-10 NOTE — Plan of Care (Signed)
Patient is awaiting if surgery to be performed. Family and patient deciding regarding IP rehab or home with home health. Patient having trouble with  memory and some confusion. Family worried that as before in rehab, she will not get the physical as she gets more confused in SNF. Eating regular diet today.  Skin integrity an issue with excoriated groin perineum. Purwick in place.  Having some loose stools.

## 2018-12-11 LAB — GLUCOSE, CAPILLARY
Glucose-Capillary: 102 mg/dL — ABNORMAL HIGH (ref 70–99)
Glucose-Capillary: 95 mg/dL (ref 70–99)
Glucose-Capillary: 106 mg/dL — ABNORMAL HIGH (ref 70–99)
Glucose-Capillary: 87 mg/dL (ref 70–99)

## 2018-12-11 LAB — PROTIME-INR
INR: 1.4 — ABNORMAL HIGH (ref 0.8–1.2)
Prothrombin Time: 17.2 seconds — ABNORMAL HIGH (ref 11.4–15.2)

## 2018-12-11 MED ORDER — ENOXAPARIN SODIUM 40 MG/0.4ML ~~LOC~~ SOLN
40.0000 mg | SUBCUTANEOUS | Status: DC
  Administered 2018-12-11 – 2018-12-18 (×7): 40 mg via SUBCUTANEOUS
  Filled 2018-12-11 (×7): qty 0.4

## 2018-12-11 NOTE — Progress Notes (Signed)
PROGRESS NOTE  [name]:[id number] DOB: [birth year] DOA: 12/04/2018 PCP: [name], MD  HPI/Recap of past 24 hours: [name] is a 78 y.o. female with medical history significant of arthritis, DJD, CHF, hypertension, hyperlipidemia, OSA, paroxysmal atrial fibrillation, Patient is a very poor historian and it was difficult to obtain a thorough history from her.  Patient reports having pain all over her body.  She is not sure for how long.  Does state that she has pain in her lower back and bilateral thighs.  She is not sure if she is having any saddle anesthesia or urinary/fecal incontinence.  States she is ambulating at home on her own with the help of a walker.  Denies any fevers, chills, chest pain, shortness of breath, cough, abdominal pain, nausea, or vomiting.  States sometimes in the past she had diarrhea and sometimes constipation, she is not sure if she is having diarrhea or constipation at present.  She is not able to tell me if she is currently taking doxycycline.  No additional history could be obtained from her.  ED Course: Afebrile.  Not tachycardic, tachypneic, or hypotensive.  Not hypoxic.  No leukocytosis.  Lactic acid normal.  INR 1.8.  BUN 32.  Creatinine 1.3, baseline 0.7-0.8.  Lipase 196.  LFTs normal.  CK normal.  UA with positive nitrite, large amount of leukocytes, 21-50 WBCs, and many bacteria.  Urine culture pending.  Blood culture x2 pending.  COVID-19 rapid test pending. Chest x-ray showing no active cardiopulmonary disease. CT head negative for acute finding. CT C-spine negative for acute finding. X-ray of left femur showing no acute osseous abnormality. X-ray of right femur showing no acute osseous abnormality.  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 X-ray of pelvis showing no acute osseous abnormality.  Mild to moderate degenerative osteoarthritis of the hips bilaterally. CT abdomen pelvis showing mild  peripancreatic fat stranding concerning for developing pancreatitis.  Large amount of stool in the rectum with presacral free fluid and rectal wall thickening concerning for developing stercoral colitis. MRI of lumbar spine done December 02, 2018 showing T12-L1 discitis-osteomyelitis with progressive disc space collapse and mildly progressive endplate erosion.  Ventral epidural phlegmon/abscess in the lower thoracic spine appears to have largely resolved, though incompletely evaluated on lumbar MRI.  Increased size of a large right-sided disc extrusion behind the T12 vertebral body with increased severe spinal stenosis.  21 x 6 mm dorsal epidural fluid collection at T12/L1 contributes and may represent abscess or old hematoma. Nursing staff reported that patient has been voiding normally in the ED. No acute urinary retention. Patient received 1 L IV fluid bolus, ceftriaxone, and fentanyl in the ED.  Severe bilateral lower extremity pain reported on 12/08/2018 for which a Doppler ultrasound of her lower extremity was completed and negative for DVT.   12/10/08: Patient seen and examined.  She was sitting in the chair.  She was much more alert and oriented today.  She complained of back pain which will radiate into her right leg.  No other complaint.  She tells me that she did talk to her daughter's yesterday but she has not come up with a final decision whether she would want to pursue surgery or not.   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  Ventral epidural phlegmon/abscess in the lower thoracic spine appears to have largely resolved, though incompletely evaluated on lumbar MRI.  Increased size of a large right-sided disc extrusion behind the T12 vertebral body with increased severe spinal stenosis.  21 x 6 mm dorsal epidural fluid collection at T12/L1 contributes and may represent abscess or old hematoma.   She is afebrile with no leukocytosis CRP 7.1 and sed rate 32 elevated Infectious disease signed off on 12/08/2018.  Recommended to continue doxycycline as tail coverage for her recent culture-negative vertebral infection.  Follow-up with Dr. Catalina Antigua on 12/25/2018. Patient discussed possible surgical plan with the neurosurgery yesterday.  Per neurosurgery's note from last week, the surgery could have grave complications and they do not recommend surgery however patient was very adamant on doing that so neurosurgery had called patient's daughters.  No decision has been made by neurosurgery and by family as of yet.  Suspected neuropathic pain mostly affecting lower extremities bilaterally likely secondary to severe spinal stenosis. Continue gabapentin 600 mg twice daily.  Persistent bilateral lower extremity pain Bilateral lower extremity Doppler ultrasound negative for DVT on 12/08/2018 Robaxin increased to 500 mg twice daily.  Pseudomonas UTI, poa Suspected contaminant or colonization per ID Urine culture grew greater than 100,000 colonies of Pseudomonas aeruginosa, awaiting sensitivities Rocephin was stopped per ID recommendation. Continue doxycycline Blood cultures drawn on 12/04/2018 no growth x5 days.  Resolved acute metabolic encephalopathy likely secondary to Pseudomonas UTI Back to her baseline Urine culture as stated above Blood cultures x2 peripherally negative to date  Mild pancreatitis, asymptomatic Lipase 196 on presentation >> 84 on 12/08/2018 CT showing mild peripancreatic fat stranding concerning for  developing pancreatitis.  This has resolved.  AKI, resolved Presented with creatinine of 1.33 Creatinine 0.8 on 12/08/2018 Continue to avoid nephrotoxins Monitor urine output  Hypertension Blood pressure is normotensive Continue Toprol-XL 100 mg daily Continue to monitor vital signs   Stercoral colitis CT showing large amount of stool in the rectum with presacral free fluid and rectal wall thickening concerning for developing stercoral colitis however patient has no signs or symptoms suggestive infection. -Dulcolax suppository scheduled.  Paroxysmal atrial fibrillation Rate controlled on Toprol-XL INR subtherapeutic 1.5 today. Defer to neurosurgery to restart Coumadin based on what the plan is going to be about her stenosis which will be surgery versus no surgery. Coumadin on hold pending neurosurgery evaluation.  We will start her on DVT prophylaxis Lovenox in the meantime.  Should resume Coumadin once a final decision by neurosurgery is done about her potential surgery.  Subtherapeutic INR INR subtherapeutic today. Management as stated above  Physical debility/ambulatory dysfunction PT OT to assess, pending recommendations. Fall precautions  DVT prophylaxis: SCDs/starting on Lovenox. Code Status: Patient wishes to be full code. Family Communication: No family available at bedside. Disposition Plan:  Pending PT OT recommendations.  Plan to discharge when neurosurgery signs off. Consults called: Neurosurgery, infectious disease (signed off on 12/08/2018).    Objective: Vitals:   12/11/18 0352 12/11/18 0732 12/11/18 0800 12/11/18 1119  BP: (!) 151/67 (!) 156/63 (!) 124/106 130/66  Pulse: 69 72 75   Resp: 11 18 12 20   Temp: 97.9 F (36.6 C) 98 F (36.7 C)  98.2 F (36.8 C)  TempSrc: Oral Oral  Oral  SpO2: 99% 99% 100% 100%  Intake/Output Summary (Last 24 hours) at 12/11/2018 1421 Last data filed at 12/11/2018 0800 Gross per 24 hour  Intake 360 ml  Output 350 ml   Net 10 ml   There were no vitals filed for this visit.  Exam: General exam: Appears calm and comfortable  Respiratory system: Clear to auscultation. Respiratory effort normal. Cardiovascular system: S1 & S2 heard, RRR. No JVD, murmurs, rubs, gallops or clicks. No pedal edema. Gastrointestinal system: Abdomen is nondistended, soft and nontender. No organomegaly or masses felt. Normal bowel sounds heard. Central nervous system: Alert and oriented. No focal neurological deficits. Extremities: She has good strength perhaps 4/ 5 in right lower extremity however her power is perhaps 3/5 in the left lower extremity.  5 x 5 in upper extremities. Skin: No rashes, lesions or ulcers Psychiatry: Judgement and insight appear poor. Mood & affect appropriate.   Data Reviewed: CBC: Recent Labs  Lab 12/04/18 1940 12/06/18 0451 12/08/18 0537  WBC 9.5 6.0 5.1  NEUTROABS 8.1* 4.6 4.1  HGB 12.1 10.5* 10.3*  HCT 39.1 32.7* 32.7*  MCV 86.3 85.6 86.1  PLT 181 147* 532   Basic Metabolic Panel: Recent Labs  Lab 12/04/18 1940 12/05/18 1125 12/08/18 0537  NA 133* 137 139  K 3.8 3.9 3.7  CL 96* 105 106  CO2 26 20* 25  GLUCOSE 94 74 95  BUN 32* 29* 14  CREATININE 1.33* 1.00 0.81  CALCIUM 9.5 9.1 8.9   GFR: CrCl cannot be calculated (Unknown ideal weight.). Liver Function Tests: Recent Labs  Lab 12/04/18 1940 12/08/18 0537  AST 16 14*  ALT 10 6  ALKPHOS 97 82  BILITOT 0.8 0.8  PROT 6.2* 5.6*  ALBUMIN 3.4* 2.8*   Recent Labs  Lab 12/04/18 1940 12/08/18 0537  LIPASE 196* 84*   No results for input(s): AMMONIA in the last 168 hours. Coagulation Profile: Recent Labs  Lab 12/07/18 0604 12/08/18 0537 12/09/18 0145 12/10/18 0641 12/11/18 0810  INR 1.8* 1.7* 1.7* 1.5* 1.4*   Cardiac Enzymes: Recent Labs  Lab 12/04/18 1940  CKTOTAL 56   BNP (last 3 results) No results for input(s): PROBNP in the last 8760 hours. HbA1C: No results for input(s): HGBA1C in the last 72 hours.  CBG: Recent Labs  Lab 12/11/18 0736 12/11/18 1147  GLUCAP 102* 95   Lipid Profile: No results for input(s): CHOL, HDL, LDLCALC, TRIG, CHOLHDL, LDLDIRECT in the last 72 hours. Thyroid Function Tests: No results for input(s): TSH, T4TOTAL, FREET4, T3FREE, THYROIDAB in the last 72 hours. Anemia Panel: No results for input(s): VITAMINB12, FOLATE, FERRITIN, TIBC, IRON, RETICCTPCT in the last 72 hours. Urine analysis:    Component Value Date/Time   COLORURINE YELLOW 12/04/2018 [place] 12/04/2018 2341   LABSPEC 1.018 12/04/2018 2341   PHURINE 5.0 12/04/2018 2341   GLUCOSEU NEGATIVE 12/04/2018 2341   HGBUR SMALL (A) 12/04/2018 2341   BILIRUBINUR NEGATIVE 12/04/2018 2341   KETONESUR NEGATIVE 12/04/2018 2341   PROTEINUR NEGATIVE 12/04/2018 2341   UROBILINOGEN 1.0 06/26/2010 1009   NITRITE POSITIVE (A) 12/04/2018 2341   LEUKOCYTESUR LARGE (A) 12/04/2018 2341   Sepsis Labs: @LABRCNTIP (procalcitonin:4,lacticidven:4)  ) Recent Results (from the past 240 hour(s))  Blood culture (routine x 2)     Status: None   Collection Time: 12/04/18  8:05 PM   Specimen: BLOOD  Result Value Ref Range Status   Specimen Description BLOOD LEFT ANTECUBITAL  Final   Special Requests   Final    BOTTLES DRAWN AEROBIC AND ANAEROBIC  Blood Culture results may not be optimal due to an excessive volume of blood received in culture bottles   Culture   Final    NO GROWTH 5 DAYS Performed at [facility] [address], [place], [place] [address]    Report Status 12/09/2018 FINAL  Final  Blood culture (routine x 2)     Status: None   Collection Time: 12/04/18  8:07 PM   Specimen: BLOOD  Result Value Ref Range Status   Specimen Description BLOOD RIGHT ANTECUBITAL  Final   Special Requests   Final    BOTTLES DRAWN AEROBIC AND ANAEROBIC Blood Culture results may not be optimal due to an inadequate volume of blood received in culture bottles   Culture   Final    NO GROWTH 5 DAYS  Performed at [facility], [place] [address], [place], [place] [address]    Report Status 12/09/2018 FINAL  Final  SARS Coronavirus 2 (CEPHEID - Performed in [facility] hospital lab), Hosp Order     Status: None   Collection Time: 12/05/18 12:30 AM   Specimen: Nasopharyngeal Swab  Result Value Ref Range Status   SARS Coronavirus 2 NEGATIVE NEGATIVE Final    Comment: (NOTE) If result is NEGATIVE SARS-CoV-2 target nucleic acids are NOT DETECTED. The SARS-CoV-2 RNA is generally detectable in upper and lower  respiratory specimens during the acute phase of infection. The lowest  concentration of SARS-CoV-2 viral copies this assay can detect is 250  copies / mL. A negative result does not preclude SARS-CoV-2 infection  and should not be used as the sole basis for treatment or other  patient management decisions.  A negative result may occur with  improper specimen collection / handling, submission of specimen other  than nasopharyngeal swab, presence of viral mutation(s) within the  areas targeted by this assay, and inadequate number of viral copies  (<250 copies / mL). A negative result must be combined with clinical  observations, patient history, and epidemiological information. If result is POSITIVE SARS-CoV-2 target nucleic acids are DETECTED. The SARS-CoV-2 RNA is generally detectable in upper and lower  respiratory specimens dur ing the acute phase of infection.  Positive  results are indicative of active infection with SARS-CoV-2.  Clinical  correlation with patient history and other diagnostic information is  necessary to determine patient infection status.  Positive results do  not rule out bacterial infection or co-infection with other viruses. If result is PRESUMPTIVE POSTIVE SARS-CoV-2 nucleic acids MAY BE PRESENT.   A presumptive positive result was obtained on the submitted specimen  and confirmed on repeat testing.  While 2019 novel coronavirus  (SARS-CoV-2) nucleic  acids may be present in the submitted sample  additional confirmatory testing may be necessary for epidemiological  and / or clinical management purposes  to differentiate between  SARS-CoV-2 and other Sarbecovirus currently known to infect humans.  If clinically indicated additional testing with an alternate test  methodology [phone]) is advised. The SARS-CoV-2 RNA is generally  detectable in upper and lower respiratory sp ecimens during the acute  phase of infection. The expected result is Negative. Fact Sheet for Patients:  StrictlyIdeas.no Fact Sheet for Healthcare Providers: BankingDealers.co.za This test is not yet approved or cleared by the [place] FDA and has been authorized for detection and/or diagnosis of SARS-CoV-2 by FDA under an Emergency Use Authorization (EUA).  This EUA will remain in effect (meaning this test can be used) for the duration of the COVID-19 declaration  under Section 564(b)(1) of the Act, 21 U.S.C. section 360bbb-3(b)(1), unless the authorization is terminated or revoked sooner. Performed at [facility], [place] [address], [place], [place] [address]   Culture, Urine     Status: Abnormal   Collection Time: 12/06/18  7:09 PM   Specimen: Urine, Catheterized  Result Value Ref Range Status   Specimen Description URINE, CATHETERIZED  Final   Special Requests   Final    NONE Performed at [facility], [place] [address], [place], [place] [address]    Culture >=100,000 COLONIES/mL PSEUDOMONAS AERUGINOSA (A)  Final   Report Status 12/08/2018 FINAL  Final   Organism ID, Bacteria PSEUDOMONAS AERUGINOSA (A)  Final      Susceptibility   Pseudomonas aeruginosa - MIC*    CEFTAZIDIME 16 INTERMEDIATE Intermediate     CIPROFLOXACIN 1 SENSITIVE Sensitive     GENTAMICIN <=1 SENSITIVE Sensitive     IMIPENEM <=0.25 SENSITIVE Sensitive     PIP/TAZO 32 SENSITIVE Sensitive     CEFEPIME INTERMEDIATE Intermediate      * >=100,000 COLONIES/mL PSEUDOMONAS AERUGINOSA  MRSA PCR Screening     Status: None   Collection Time: 12/08/18 12:13 PM   Specimen: Nasal Mucosa; Nasopharyngeal  Result Value Ref Range Status   MRSA by PCR NEGATIVE NEGATIVE Final    Comment:        The GeneXpert MRSA Assay (FDA approved for NASAL specimens only), is one component of a comprehensive MRSA colonization surveillance program. It is not intended to diagnose MRSA infection nor to guide or monitor treatment for MRSA infections. Performed at [facility], [place] [address] [place], [place] [address]       Studies: No results found.  Scheduled Meds: . bisacodyl  10 mg Rectal Daily  . carbidopa-levodopa  2 tablet Oral TID  . doxycycline  100 mg Oral BID  . flecainide  50 mg Oral BID  . gabapentin  600 mg Oral BID  . mouth rinse  15 mL Mouth Rinse BID  . methocarbamol  500 mg Oral BID  . metoprolol succinate  100 mg Oral Daily  . pantoprazole  40 mg Oral BID  . polyethylene glycol  17 g Oral Daily  . pramipexole  1 mg Oral QHS  . pravastatin  40 mg Oral QHS      LOS: 6 days     Darliss Cheney, MD [facility] Pager [phone]  If 7PM-7AM, please contact night-coverage [email or web address] Password [email or web address] 12/11/2018, 2:21 PM

## 2018-12-11 NOTE — Care Management Important Message (Signed)
Important Message  Patient Details  Name: [name] MRN: [id number] Date of Birth: Nov 07, 1940   Medicare Important Message Given:  Yes     [name] 12/11/2018, 2:09 PM

## 2018-12-11 NOTE — TOC Progression Note (Signed)
Transition of Care [facility]) - Progression Note    Patient Details  Name: [name] MRN: [id number] Date of Birth: 15-Aug-1940  Transition of Care [facility]) CM/SW [place], [place] Phone Number: 12/11/2018, 11:11 AM  Clinical Narrative:    CSW following for clarification of further surgical intervention prior to f/u with family and pt.   Expected Discharge Plan: Skilled Nursing Facility(Or home health, no agreed upon disposition at this time.) Barriers to Discharge: Continued Medical Work up, Orthoptist and Services Expected Discharge Plan: Skilled Nursing Facility(Or home health, no agreed upon disposition at this time.)  Living arrangements for the past 2 months: Single Family Home  Social Determinants of Health (SDOH) Interventions    Readmission Risk Interventions Readmission Risk Prevention Plan 10/02/2018  Transportation Screening Complete  PCP or Specialist Appt within 5-7 Days Complete  Home Care Screening Complete  Medication Review (RN CM) Complete  Some recent data might be hidden

## 2018-12-11 NOTE — Progress Notes (Signed)
Physical Therapy Treatment Patient Details Name: [name] MRN: [id number] DOB: 1941/05/15 Today's Date: 12/11/2018    History of Present Illness 78 y.o. female with prior epidural abscess, now with continued back pain, admission workup notable for UTI and pancreatitis. MRI of lumbar spine done December 02, 2018 showing T12-L1 discitis-osteomyelitis with progressive disc space collapse and mildly progressive endplate erosion. Admitted with vertebral osteo, epidural abscess and spinal stenosis secondary to disc extrusion at T12. PMH including arthritis, DJD, CHF, HTN, hyperlipidemia, OSA, Parkinson's disease, and paroxysmal atrial fibrillation.    PT Comments    Pt required +2 max assist bed mobility and +2 max/total assist sit to stand in bariatric stedy. Stedy utilized for bed to recliner transfer. Mobility limited by pain. Pt in recliner with feet elevated at end of session.   Follow Up Recommendations  SNF;Supervision for mobility/OOB;Supervision/Assistance - 24 hour     Equipment Recommendations  Other (comment)(defer to next venue)    Recommendations for Other Services       Precautions / Restrictions Precautions Precautions: Fall;Back Precaution Comments: Reviewed back precautions Restrictions Weight Bearing Restrictions: No    Mobility  Bed Mobility Overal bed mobility: Needs Assistance Bed Mobility: Rolling;Sidelying to Sit Rolling: Mod assist Sidelying to sit: +2 for physical assistance;Max assist       General bed mobility comments: cues for sequencing, assist with BLE off EOB and to elevate trunk  Transfers Overall transfer level: Needs assistance   Transfers: Sit to/from Stand Sit to Stand: +2 physical assistance;Max assist;Total assist         General transfer comment: +2 max/total assist to power up in bariatric stedy. Multi attempts due to pt pushing with BUE instead of pulling. This appears to be in response to pain. Difficulty following  cues/commands due to cognitive status.  Ambulation/Gait             General Gait Details: Unable   Marine scientist Rankin (Stroke Patients Only)       Balance Overall balance assessment: Needs assistance Sitting-balance support: Bilateral upper extremity supported;Feet supported Sitting balance-Leahy Scale: Poor Sitting balance - Comments: BUE assist and therapist assist to maintain balance EOB Postural control: Posterior lean Standing balance support: Bilateral upper extremity supported;During functional activity Standing balance-Leahy Scale: Zero Standing balance comment: Reliant on external support                            Cognition Arousal/Alertness: Awake/alert Behavior During Therapy: Flat affect Overall Cognitive Status: Impaired/Different from baseline Area of Impairment: Attention;Memory;Following commands;Safety/judgement;Awareness;Problem solving                 Orientation Level: Disoriented to;Time;Situation Current Attention Level: Focused Memory: Decreased short-term memory;Decreased recall of precautions Following Commands: Follows one step commands inconsistently;Follows one step commands with increased time Safety/Judgement: Decreased awareness of safety Awareness: Intellectual Problem Solving: Slow processing;Requires verbal cues;Difficulty sequencing General Comments: cues to stay on task, distracted by pain      Exercises      General Comments General comments (skin integrity, edema, etc.): VSS      Pertinent Vitals/Pain Pain Assessment: Faces Faces Pain Scale: Hurts whole lot Pain Location: back Pain Descriptors / Indicators: Grimacing;Guarding;Discomfort Pain Intervention(s): Monitored during session;Repositioned    Home Living  Prior Function            PT Goals (current goals can now be found in the care plan section) Acute Rehab PT  Goals Patient Stated Goal: not stated PT Goal Formulation: With patient Time For Goal Achievement: 12/23/18 Potential to Achieve Goals: Fair Progress towards PT goals: Progressing toward goals    Frequency    Min 3X/week      PT Plan Current plan remains appropriate    Co-evaluation              AM-PAC PT "6 Clicks" Mobility   Outcome Measure  Help needed turning from your back to your side while in a flat bed without using bedrails?: A Lot Help needed moving from lying on your back to sitting on the side of a flat bed without using bedrails?: Total Help needed moving to and from a bed to a chair (including a wheelchair)?: Total Help needed standing up from a chair using your arms (e.g., wheelchair or bedside chair)?: Total Help needed to walk in hospital room?: Total Help needed climbing 3-5 steps with a railing? : Total 6 Click Score: 7    End of Session Equipment Utilized During Treatment: Gait belt Activity Tolerance: Patient limited by pain Patient left: in chair;with call bell/phone within reach Nurse Communication: Mobility status PT Visit Diagnosis: Pain;Unsteadiness on feet (R26.81);Muscle weakness (generalized) (M62.81);Difficulty in walking, not elsewhere classified (R26.2)     Time: 1572-6203 PT Time Calculation (min) (ACUTE ONLY): 17 min  Charges:  $Therapeutic Activity: 8-22 mins                     Lorrin Goodell, PT  Office # [phone] Pager [phone]    [name] 12/11/2018, 10:56 AM

## 2018-12-12 DIAGNOSIS — M4804 Spinal stenosis, thoracic region: Secondary | ICD-10-CM

## 2018-12-12 DIAGNOSIS — N10 Acute tubulo-interstitial nephritis: Secondary | ICD-10-CM

## 2018-12-12 DIAGNOSIS — N179 Acute kidney failure, unspecified: Secondary | ICD-10-CM

## 2018-12-12 DIAGNOSIS — M4620 Osteomyelitis of vertebra, site unspecified: Secondary | ICD-10-CM

## 2018-12-12 DIAGNOSIS — K861 Other chronic pancreatitis: Secondary | ICD-10-CM

## 2018-12-12 LAB — GLUCOSE, CAPILLARY: Glucose-Capillary: 91 mg/dL (ref 70–99)

## 2018-12-12 LAB — PROTIME-INR
INR: 1.3 — ABNORMAL HIGH (ref 0.8–1.2)
Prothrombin Time: 15.9 seconds — ABNORMAL HIGH (ref 11.4–15.2)

## 2018-12-12 MED ORDER — SALINE SPRAY 0.65 % NA SOLN
1.0000 | NASAL | Status: DC | PRN
  Filled 2018-12-12: qty 44

## 2018-12-12 MED ORDER — LIP MEDEX EX OINT
1.0000 "application " | TOPICAL_OINTMENT | CUTANEOUS | Status: DC | PRN
  Filled 2018-12-12: qty 7

## 2018-12-12 MED ORDER — POLYETHYLENE GLYCOL 3350 17 G PO PACK: 17.0000 g | PACK | Freq: Every day | ORAL | Status: DC | PRN

## 2018-12-12 MED ORDER — SENNOSIDES-DOCUSATE SODIUM 8.6-50 MG PO TABS: 2.0000 | ORAL_TABLET | Freq: Every evening | ORAL | Status: DC | PRN

## 2018-12-12 MED ORDER — GUAIFENESIN-DM 100-10 MG/5ML PO SYRP: 5.0000 mL | ORAL_SOLUTION | ORAL | Status: DC | PRN

## 2018-12-12 MED ORDER — HYDROCORTISONE 1 % EX CREA
1.0000 "application " | TOPICAL_CREAM | Freq: Three times a day (TID) | CUTANEOUS | Status: DC | PRN
  Filled 2018-12-12: qty 28

## 2018-12-12 MED ORDER — HYDROCORTISONE (PERIANAL) 2.5 % EX CREA
1.0000 "application " | TOPICAL_CREAM | Freq: Four times a day (QID) | CUTANEOUS | Status: DC | PRN
  Filled 2018-12-12: qty 28.35

## 2018-12-12 MED ORDER — MUSCLE RUB 10-15 % EX CREA
1.0000 "application " | TOPICAL_CREAM | CUTANEOUS | Status: DC | PRN
  Filled 2018-12-12: qty 85

## 2018-12-12 MED ORDER — POLYVINYL ALCOHOL 1.4 % OP SOLN
1.0000 [drp] | OPHTHALMIC | Status: DC | PRN
  Filled 2018-12-12: qty 15

## 2018-12-12 MED ORDER — PHENOL 1.4 % MT LIQD: 1.0000 | OROMUCOSAL | Status: DC | PRN

## 2018-12-12 MED ORDER — ALUM & MAG HYDROXIDE-SIMETH 200-200-20 MG/5ML PO SUSP: 30.0000 mL | ORAL | Status: DC | PRN

## 2018-12-12 MED ORDER — HYDRALAZINE HCL 20 MG/ML IJ SOLN
10.0000 mg | INTRAMUSCULAR | Status: DC | PRN
  Administered 2018-12-12: 10 mg via INTRAVENOUS
  Filled 2018-12-12: qty 1

## 2018-12-12 MED ORDER — LORATADINE 10 MG PO TABS: 10.0000 mg | ORAL_TABLET | Freq: Every day | ORAL | Status: DC | PRN

## 2018-12-12 MED ORDER — IPRATROPIUM-ALBUTEROL 0.5-2.5 (3) MG/3ML IN SOLN: 3.0000 mL | RESPIRATORY_TRACT | Status: DC | PRN

## 2018-12-12 NOTE — Progress Notes (Signed)
PROGRESS NOTE    [name]  WNU:[id number] DOB: January 31, 1941 DOA: 12/04/2018 PCP: [name], MD   Brief Narrative:  78 year old with history of osteoarthritis, degenerative disc disease, CHF, hypertension, hyperlipidemia, paroxysmal atrial fibrillation was brought to the hospital for generalized weakness but also lower backache.  She had extensive work-up done including CT of the head which was negative.  CT of the cervical spine which was negative.  X-ray of the left femur, right femur, pelvis which was all negative.  CT of the abdomen pelvis showed concerns for possible developing acute pancreatitis, large amount of stool in the colon with stercoral colitis.  MRI of the lumbar spine showed discitis/osteomyelitis with concerns of abscess versus old hematoma.  Neurosurgery team was consulted.   Assessment & Plan:   Principal Problem:   Spinal stenosis Active Problems:   Vertebral osteomyelitis (HCC)   AKI (acute kidney injury) ([facility])   UTI (urinary tract infection)  Thoracolumbar vertebral osteomyelitis with fluid collection concerning for abscess versus old hematoma - At this time would continue IV antibiotics.  Neurosurgery to make decision with the family in terms of surgical intervention. -Infectious disease recommending oral doxycycline until seen outpatient by Dr. Prince Rome on 12/25/2018.Infectious disease has signed off  Radicular/neuropathic pain in bilateral lower extremities secondary to severe spinal stenosis - On gabapentin 600 mg twice daily. - Dopplers negative for DVT -On Robaxin 500 mg twice daily.  Pseudomonas UTI, POA - Contamination?.  On doxycycline per infectious disease  Essential hypertension -Continue Toprol-XL 100 mg daily.  Paroxysmal atrial fibrillation -On Toprol-XL.  Coumadin is currently on hold due to concerns for possibly going for surgery.    Stercoral colitis -Secondary to large amount of stool in the rectum.  Aggressive bowel regimen.   No evidence of infection.  Physical therapy is recommending skilled nursing facility  DVT prophylaxis: SCDs Code Status: Full code Family Communication: None at bedside Disposition Plan: Pending surgical plans per neurosurgery.  Consultants:   Neurosurgery  Infectious disease-signed off  Procedures:   None  Antimicrobials:   Doxycycline   Subjective: Still reports of lower back pain with some tingling in her lower extremities.  No other complaints.  Review of Systems Otherwise negative except as per HPI, including: General: Denies fever, chills, night sweats or unintended weight loss. Resp: Denies cough, wheezing, shortness of breath. Cardiac: Denies chest pain, palpitations, orthopnea, paroxysmal nocturnal dyspnea. GI: Denies abdominal pain, nausea, vomiting, diarrhea or constipation GU: Denies dysuria, frequency, hesitancy or incontinence MS: Denies muscle aches, joint pain or swelling Neuro: Denies headache, neurologic deficits (focal weakness, numbness, tingling), abnormal gait Psych: Denies anxiety, depression, SI/HI/AVH Skin: Denies new rashes or lesions ID: Denies sick contacts, exotic exposures, travel  Objective: Vitals:   12/11/18 2350 12/12/18 0345 12/12/18 0815 12/12/18 1235  BP:   (!) 135/51 117/61  Pulse:   67 77  Resp:   (!) 22 (!) 22  Temp: 97.6 F (36.4 C) 97.8 F (36.6 C) (!) 97.5 F (36.4 C) (!) 97.4 F (36.3 C)  TempSrc: Oral Oral  Oral  SpO2:   100% 99%   No intake or output data in the 24 hours ending 12/12/18 1240 There were no vitals filed for this visit.  Examination:  General exam: Appears calm and comfortable  Respiratory system: Clear to auscultation. Respiratory effort normal. Cardiovascular system: S1 & S2 heard, RRR. No JVD, murmurs, rubs, gallops or clicks. No pedal edema. Gastrointestinal system: Abdomen is nondistended, soft and nontender. No organomegaly or masses felt. Normal  bowel sounds heard. Central nervous system:  Alert and oriented. No focal neurological deficits. Extremities: Symmetric 4 x 5 power. Skin: No rashes, lesions or ulcers Psychiatry: Overall poor judgment.  Alert awake and oriented to name and place but not to today's date.    Data Reviewed:   CBC: Recent Labs  Lab 12/06/18 0451 12/08/18 0537  WBC 6.0 5.1  NEUTROABS 4.6 4.1  HGB 10.5* 10.3*  HCT 32.7* 32.7*  MCV 85.6 86.1  PLT 147* 585   Basic Metabolic Panel: Recent Labs  Lab 12/08/18 0537  NA 139  K 3.7  CL 106  CO2 25  GLUCOSE 95  BUN 14  CREATININE 0.81  CALCIUM 8.9   GFR: CrCl cannot be calculated (Unknown ideal weight.). Liver Function Tests: Recent Labs  Lab 12/08/18 0537  AST 14*  ALT 6  ALKPHOS 82  BILITOT 0.8  PROT 5.6*  ALBUMIN 2.8*   Recent Labs  Lab 12/08/18 0537  LIPASE 84*   No results for input(s): AMMONIA in the last 168 hours. Coagulation Profile: Recent Labs  Lab 12/08/18 0537 12/09/18 0145 12/10/18 0641 12/11/18 0810 12/12/18 0521  INR 1.7* 1.7* 1.5* 1.4* 1.3*   Cardiac Enzymes: No results for input(s): CKTOTAL, CKMB, CKMBINDEX, TROPONINI in the last 168 hours. BNP (last 3 results) No results for input(s): PROBNP in the last 8760 hours. HbA1C: No results for input(s): HGBA1C in the last 72 hours. CBG: Recent Labs  Lab 12/11/18 0736 12/11/18 1147 12/11/18 1619 12/11/18 2141 12/12/18 0818  GLUCAP 102* 95 106* 87 91   Lipid Profile: No results for input(s): CHOL, HDL, LDLCALC, TRIG, CHOLHDL, LDLDIRECT in the last 72 hours. Thyroid Function Tests: No results for input(s): TSH, T4TOTAL, FREET4, T3FREE, THYROIDAB in the last 72 hours. Anemia Panel: No results for input(s): VITAMINB12, FOLATE, FERRITIN, TIBC, IRON, RETICCTPCT in the last 72 hours. Sepsis Labs: No results for input(s): PROCALCITON, LATICACIDVEN in the last 168 hours.  Recent Results (from the past 240 hour(s))  Blood culture (routine x 2)     Status: None   Collection Time: 12/04/18  8:05 PM    Specimen: BLOOD  Result Value Ref Range Status   Specimen Description BLOOD LEFT ANTECUBITAL  Final   Special Requests   Final    BOTTLES DRAWN AEROBIC AND ANAEROBIC Blood Culture results may not be optimal due to an excessive volume of blood received in culture bottles   Culture   Final    NO GROWTH 5 DAYS Performed at [facility], [place] [address] [place], [place] [address]    Report Status 12/09/2018 FINAL  Final  Blood culture (routine x 2)     Status: None   Collection Time: 12/04/18  8:07 PM   Specimen: BLOOD  Result Value Ref Range Status   Specimen Description BLOOD RIGHT ANTECUBITAL  Final   Special Requests   Final    BOTTLES DRAWN AEROBIC AND ANAEROBIC Blood Culture results may not be optimal due to an inadequate volume of blood received in culture bottles   Culture   Final    NO GROWTH 5 DAYS Performed at [facility], [place] [address] [place], [place] [address]    Report Status 12/09/2018 FINAL  Final  SARS Coronavirus 2 (CEPHEID - Performed in [place] hospital lab), Hosp Order     Status: None   Collection Time: 12/05/18 12:30 AM   Specimen: Nasopharyngeal Swab  Result Value Ref Range Status   SARS Coronavirus 2 NEGATIVE NEGATIVE  Final    Comment: (NOTE) If result is NEGATIVE SARS-CoV-2 target nucleic acids are NOT DETECTED. The SARS-CoV-2 RNA is generally detectable in upper and lower  respiratory specimens during the acute phase of infection. The lowest  concentration of SARS-CoV-2 viral copies this assay can detect is 250  copies / mL. A negative result does not preclude SARS-CoV-2 infection  and should not be used as the sole basis for treatment or other  patient management decisions.  A negative result may occur with  improper specimen collection / handling, submission of specimen other  than nasopharyngeal swab, presence of viral mutation(s) within the  areas targeted by this assay, and inadequate number of viral copies  (<250 copies / mL). A  negative result must be combined with clinical  observations, patient history, and epidemiological information. If result is POSITIVE SARS-CoV-2 target nucleic acids are DETECTED. The SARS-CoV-2 RNA is generally detectable in upper and lower  respiratory specimens dur ing the acute phase of infection.  Positive  results are indicative of active infection with SARS-CoV-2.  Clinical  correlation with patient history and other diagnostic information is  necessary to determine patient infection status.  Positive results do  not rule out bacterial infection or co-infection with other viruses. If result is PRESUMPTIVE POSTIVE SARS-CoV-2 nucleic acids MAY BE PRESENT.   A presumptive positive result was obtained on the submitted specimen  and confirmed on repeat testing.  While 2019 novel coronavirus  (SARS-CoV-2) nucleic acids may be present in the submitted sample  additional confirmatory testing may be necessary for epidemiological  and / or clinical management purposes  to differentiate between  SARS-CoV-2 and other Sarbecovirus currently known to infect humans.  If clinically indicated additional testing with an alternate test  methodology [phone]) is advised. The SARS-CoV-2 RNA is generally  detectable in upper and lower respiratory sp ecimens during the acute  phase of infection. The expected result is Negative. Fact Sheet for Patients:  StrictlyIdeas.no Fact Sheet for Healthcare Providers: BankingDealers.co.za This test is not yet approved or cleared by the [place] FDA and has been authorized for detection and/or diagnosis of SARS-CoV-2 by FDA under an Emergency Use Authorization (EUA).  This EUA will remain in effect (meaning this test can be used) for the duration of the COVID-19 declaration under Section 564(b)(1) of the Act, 21 U.S.C. section 360bbb-3(b)(1), unless the authorization is terminated or revoked sooner. Performed  at [facility], [place] [address] [place], [place] [address]   Culture, Urine     Status: Abnormal   Collection Time: 12/06/18  7:09 PM   Specimen: Urine, Catheterized  Result Value Ref Range Status   Specimen Description URINE, CATHETERIZED  Final   Special Requests   Final    NONE Performed at [facility], [place] [address] [place], [place] [address]    Culture >=100,000 COLONIES/mL PSEUDOMONAS AERUGINOSA (A)  Final   Report Status 12/08/2018 FINAL  Final   Organism ID, Bacteria PSEUDOMONAS AERUGINOSA (A)  Final      Susceptibility   Pseudomonas aeruginosa - MIC*    CEFTAZIDIME 16 INTERMEDIATE Intermediate     CIPROFLOXACIN 1 SENSITIVE Sensitive     GENTAMICIN <=1 SENSITIVE Sensitive     IMIPENEM <=0.25 SENSITIVE Sensitive     PIP/TAZO 32 SENSITIVE Sensitive     CEFEPIME INTERMEDIATE Intermediate     * >=100,000 COLONIES/mL PSEUDOMONAS AERUGINOSA  MRSA PCR Screening     Status: None   Collection Time: 12/08/18 12:13 PM  Specimen: Nasal Mucosa; Nasopharyngeal  Result Value Ref Range Status   MRSA by PCR NEGATIVE NEGATIVE Final    Comment:        The GeneXpert MRSA Assay (FDA approved for NASAL specimens only), is one component of a comprehensive MRSA colonization surveillance program. It is not intended to diagnose MRSA infection nor to guide or monitor treatment for MRSA infections. Performed at [facility], [place] [address] [place], [place] [address]          Radiology Studies: No results found.      Scheduled Meds: . bisacodyl  10 mg Rectal Daily  . carbidopa-levodopa  2 tablet Oral TID  . doxycycline  100 mg Oral BID  . enoxaparin (LOVENOX) injection  40 mg Subcutaneous Q24H  . flecainide  50 mg Oral BID  . gabapentin  600 mg Oral BID  . mouth rinse  15 mL Mouth Rinse BID  . methocarbamol  500 mg Oral BID  . metoprolol succinate  100 mg Oral Daily  . pantoprazole  40 mg Oral BID  . polyethylene glycol  17 g Oral Daily  .  pramipexole  1 mg Oral QHS  . pravastatin  40 mg Oral QHS   Continuous Infusions:   LOS: 7 days   Time spent= 40 mins    Masako Overall Arsenio Loader, MD [facility]  If 7PM-7AM, please contact night-coverage [email or web address] 12/12/2018, 12:40 PM

## 2018-12-12 NOTE — Progress Notes (Signed)
Spoke with both of patient's daughters extensively the past week and just now to confirm their decision. We discussed risks and benefits of surgery at length and they would like to proceed. Will plan on surgery tomorrow afternoon with decompression of her stenotic area and extension of her hardware. I explained to them that it would be a rough post-operative course, but that it is the best chance she has for regaining function in her legs and regaining the ability to ambulate.

## 2018-12-13 ENCOUNTER — Inpatient Hospital Stay (HOSPITAL_COMMUNITY): Payer: Medicare Other | Admitting: Anesthesiology

## 2018-12-13 ENCOUNTER — Encounter (HOSPITAL_COMMUNITY)
Admission: EM | Disposition: A | Discharge status: Skilled Nursing Facility | Payer: Self-pay | Source: Home / Self Care | Attending: Internal Medicine

## 2018-12-13 ENCOUNTER — Inpatient Hospital Stay (HOSPITAL_COMMUNITY): Payer: Medicare Other

## 2018-12-13 ENCOUNTER — Encounter (HOSPITAL_COMMUNITY): Payer: Self-pay | Admitting: Anesthesiology

## 2018-12-13 DIAGNOSIS — K859 Acute pancreatitis without necrosis or infection, unspecified: Secondary | ICD-10-CM

## 2018-12-13 HISTORY — PX: POSTERIOR LUMBAR FUSION 4 LEVEL: SHX6037

## 2018-12-13 LAB — CBC
HCT: 32.7 % — ABNORMAL LOW (ref 36.0–46.0)
Hemoglobin: 10.4 g/dL — ABNORMAL LOW (ref 12.0–15.0)
MCH: 27.4 pg (ref 26.0–34.0)
MCHC: 31.8 g/dL (ref 30.0–36.0)
MCV: 86.1 fL (ref 80.0–100.0)
Platelets: 190 10*3/uL (ref 150–400)
RBC: 3.8 MIL/uL — ABNORMAL LOW (ref 3.87–5.11)
RDW: 15.8 % — ABNORMAL HIGH (ref 11.5–15.5)
WBC: 4.9 10*3/uL (ref 4.0–10.5)
nRBC: 0 % (ref 0.0–0.2)

## 2018-12-13 LAB — COMPREHENSIVE METABOLIC PANEL
ALT: 6 U/L (ref 0–44)
AST: 12 U/L — ABNORMAL LOW (ref 15–41)
Albumin: 2.8 g/dL — ABNORMAL LOW (ref 3.5–5.0)
Alkaline Phosphatase: 75 U/L (ref 38–126)
Anion gap: 9 (ref 5–15)
BUN: 18 mg/dL (ref 8–23)
CO2: 22 mmol/L (ref 22–32)
Calcium: 9.1 mg/dL (ref 8.9–10.3)
Chloride: 106 mmol/L (ref 98–111)
Creatinine, Ser: 0.74 mg/dL (ref 0.44–1.00)
GFR calc Af Amer: >60 mL/min (ref >60)
GFR calc non Af Amer: >60 mL/min (ref >60)
Glucose, Bld: 91 mg/dL (ref 70–99)
Potassium: 3.1 mmol/L — ABNORMAL LOW (ref 3.5–5.1)
Sodium: 137 mmol/L (ref 135–145)
Total Bilirubin: 0.7 mg/dL (ref 0.3–1.2)
Total Protein: 5.1 g/dL — ABNORMAL LOW (ref 6.5–8.1)

## 2018-12-13 LAB — TYPE AND SCREEN
ABO/RH(D): A POS
Antibody Screen: NEGATIVE

## 2018-12-13 LAB — PROTIME-INR
INR: 1.3 — ABNORMAL HIGH (ref 0.8–1.2)
Prothrombin Time: 16.2 seconds — ABNORMAL HIGH (ref 11.4–15.2)

## 2018-12-13 LAB — MAGNESIUM: Magnesium: 1.5 mg/dL — ABNORMAL LOW (ref 1.7–2.4)

## 2018-12-13 SURGERY — POSTERIOR LUMBAR FUSION 4 LEVEL
Anesthesia: General | Site: Spine Thoracic

## 2018-12-13 MED ORDER — FENTANYL CITRATE (PF) 250 MCG/5ML IJ SOLN
INTRAMUSCULAR | Status: AC
  Filled 2018-12-13: qty 5

## 2018-12-13 MED ORDER — FENTANYL CITRATE (PF) 100 MCG/2ML IJ SOLN
INTRAMUSCULAR | Status: DC | PRN
  Administered 2018-12-13 (×3): 50 ug via INTRAVENOUS
  Administered 2018-12-13: 100 ug via INTRAVENOUS

## 2018-12-13 MED ORDER — LIDOCAINE 2% (20 MG/ML) 5 ML SYRINGE
INTRAMUSCULAR | Status: DC | PRN
  Administered 2018-12-13: 60 mg via INTRAVENOUS

## 2018-12-13 MED ORDER — OXYCODONE HCL 5 MG PO TABS: 5.0000 mg | ORAL_TABLET | Freq: Once | ORAL | Status: DC | PRN

## 2018-12-13 MED ORDER — THROMBIN 5000 UNITS EX SOLR
OROMUCOSAL | Status: DC | PRN
  Administered 2018-12-13: 5 mL via TOPICAL

## 2018-12-13 MED ORDER — OXYCODONE HCL 5 MG/5ML PO SOLN: 5.0000 mg | Freq: Once | ORAL | Status: DC | PRN

## 2018-12-13 MED ORDER — SUGAMMADEX SODIUM 200 MG/2ML IV SOLN
INTRAVENOUS | Status: DC | PRN
  Administered 2018-12-13: 200 mg via INTRAVENOUS

## 2018-12-13 MED ORDER — ONDANSETRON HCL 4 MG/2ML IJ SOLN
INTRAMUSCULAR | Status: DC | PRN
  Administered 2018-12-13: 4 mg via INTRAVENOUS

## 2018-12-13 MED ORDER — ARTIFICIAL TEARS OPHTHALMIC OINT
TOPICAL_OINTMENT | OPHTHALMIC | Status: AC
  Filled 2018-12-13: qty 3.5

## 2018-12-13 MED ORDER — PROPOFOL 10 MG/ML IV BOLUS
INTRAVENOUS | Status: DC | PRN
  Administered 2018-12-13: 120 mg via INTRAVENOUS

## 2018-12-13 MED ORDER — LIDOCAINE 2% (20 MG/ML) 5 ML SYRINGE
INTRAMUSCULAR | Status: AC
  Filled 2018-12-13: qty 5

## 2018-12-13 MED ORDER — ROCURONIUM BROMIDE 10 MG/ML (PF) SYRINGE
PREFILLED_SYRINGE | INTRAVENOUS | Status: DC | PRN
  Administered 2018-12-13: 60 mg via INTRAVENOUS

## 2018-12-13 MED ORDER — BACITRACIN ZINC 500 UNIT/GM EX OINT
TOPICAL_OINTMENT | CUTANEOUS | Status: DC | PRN
  Administered 2018-12-13: 1 via TOPICAL

## 2018-12-13 MED ORDER — LIDOCAINE-EPINEPHRINE 1 %-1:100000 IJ SOLN
INTRAMUSCULAR | Status: AC
  Filled 2018-12-13: qty 1

## 2018-12-13 MED ORDER — 0.9 % SODIUM CHLORIDE (POUR BTL) OPTIME
TOPICAL | Status: DC | PRN
  Administered 2018-12-13: 1000 mL

## 2018-12-13 MED ORDER — SODIUM CHLORIDE 0.9 % IV SOLN
INTRAVENOUS | Status: DC | PRN
  Administered 2018-12-13: 50 ug/min via INTRAVENOUS

## 2018-12-13 MED ORDER — POTASSIUM CHLORIDE CRYS ER 20 MEQ PO TBCR
40.0000 meq | EXTENDED_RELEASE_TABLET | Freq: Once | ORAL | Status: DC
  Filled 2018-12-13: qty 2

## 2018-12-13 MED ORDER — CEFAZOLIN SODIUM-DEXTROSE 2-3 GM-%(50ML) IV SOLR
INTRAVENOUS | Status: DC | PRN
  Administered 2018-12-13 (×2): 2 g via INTRAVENOUS

## 2018-12-13 MED ORDER — BACITRACIN ZINC 500 UNIT/GM EX OINT
TOPICAL_OINTMENT | CUTANEOUS | Status: AC
  Filled 2018-12-13: qty 28.35

## 2018-12-13 MED ORDER — FENTANYL CITRATE (PF) 100 MCG/2ML IJ SOLN: 25.0000 ug | INTRAMUSCULAR | Status: DC | PRN

## 2018-12-13 MED ORDER — POTASSIUM CHLORIDE 10 MEQ/100ML IV SOLN
10.0000 meq | INTRAVENOUS | Status: AC
  Administered 2018-12-13 (×2): 10 meq via INTRAVENOUS
  Filled 2018-12-13 (×2): qty 100

## 2018-12-13 MED ORDER — LACTATED RINGERS IV SOLN
INTRAVENOUS | Status: DC | PRN
  Administered 2018-12-13 (×3): via INTRAVENOUS

## 2018-12-13 MED ORDER — THROMBIN 5000 UNITS EX SOLR
CUTANEOUS | Status: AC
  Filled 2018-12-13: qty 5000

## 2018-12-13 MED ORDER — BUPIVACAINE HCL (PF) 0.5 % IJ SOLN
INTRAMUSCULAR | Status: AC
  Filled 2018-12-13: qty 30

## 2018-12-13 MED ORDER — MAGNESIUM SULFATE 2 GM/50ML IV SOLN
2.0000 g | Freq: Once | INTRAVENOUS | Status: AC
  Administered 2018-12-13: 2 g via INTRAVENOUS
  Filled 2018-12-13: qty 50

## 2018-12-13 MED ORDER — ROCURONIUM BROMIDE 10 MG/ML (PF) SYRINGE
PREFILLED_SYRINGE | INTRAVENOUS | Status: AC
  Filled 2018-12-13: qty 10

## 2018-12-13 MED ORDER — DEXAMETHASONE SODIUM PHOSPHATE 10 MG/ML IJ SOLN
INTRAMUSCULAR | Status: DC | PRN
  Administered 2018-12-13: 5 mg via INTRAVENOUS

## 2018-12-13 MED ORDER — PROPOFOL 500 MG/50ML IV EMUL
INTRAVENOUS | Status: DC | PRN
  Administered 2018-12-13: 25 ug/kg/min via INTRAVENOUS

## 2018-12-13 MED ORDER — LIDOCAINE-EPINEPHRINE 1 %-1:100000 IJ SOLN
INTRAMUSCULAR | Status: DC | PRN
  Administered 2018-12-13: 5 mL

## 2018-12-13 MED ORDER — PROPOFOL 10 MG/ML IV BOLUS
INTRAVENOUS | Status: AC
  Filled 2018-12-13: qty 20

## 2018-12-13 MED ORDER — CEFAZOLIN SODIUM-DEXTROSE 2-4 GM/100ML-% IV SOLN
INTRAVENOUS | Status: AC
  Filled 2018-12-13: qty 100

## 2018-12-13 MED ORDER — BUPIVACAINE HCL (PF) 0.5 % IJ SOLN
INTRAMUSCULAR | Status: DC | PRN
  Administered 2018-12-13: 5 mL

## 2018-12-13 MED ORDER — ONDANSETRON HCL 4 MG/2ML IJ SOLN: 4.0000 mg | Freq: Once | INTRAMUSCULAR | Status: DC | PRN

## 2018-12-13 MED ORDER — SODIUM CHLORIDE 0.9 % IV SOLN
INTRAVENOUS | Status: DC | PRN
  Administered 2018-12-13: 500 mL

## 2018-12-13 SURGICAL SUPPLY — 58 items
ADH SKN CLS APL DERMABOND .7 (GAUZE/BANDAGES/DRESSINGS) ×1
BAG DECANTER FOR FLEXI CONT (MISCELLANEOUS) ×2 IMPLANT
BLADE SURG 11 STRL SS (BLADE) ×2 IMPLANT
BUR MATCHSTICK NEURO 3.0 LAGG (BURR) ×2 IMPLANT
BUR PRECISION FLUTE 5.0 (BURR) ×2 IMPLANT
CANISTER SUCT 3000ML PPV (MISCELLANEOUS) ×2 IMPLANT
CONT SPEC 4OZ CLIKSEAL STRL BL (MISCELLANEOUS) ×2 IMPLANT
COVER BACK TABLE 60X90IN (DRAPES) ×2 IMPLANT
DERMABOND ADVANCED (GAUZE/BANDAGES/DRESSINGS) ×1
DERMABOND ADVANCED .7 DNX12 (GAUZE/BANDAGES/DRESSINGS) ×1 IMPLANT
DRAPE C-ARM 42X72 X-RAY (DRAPES) ×1 IMPLANT
DRAPE C-ARMOR (DRAPES) ×1 IMPLANT
DRAPE LAPAROTOMY 100X72X124 (DRAPES) ×2 IMPLANT
DRAPE SURG 17X23 STRL (DRAPES) ×2 IMPLANT
DRSG OPSITE POSTOP 4X10 (GAUZE/BANDAGES/DRESSINGS) ×1 IMPLANT
DURAPREP 26ML APPLICATOR (WOUND CARE) ×2 IMPLANT
ELECT REM PT RETURN 9FT ADLT (ELECTROSURGICAL) ×2
ELECTRODE REM PT RTRN 9FT ADLT (ELECTROSURGICAL) ×1 IMPLANT
GAUZE 4X4 16PLY RFD (DISPOSABLE) IMPLANT
GAUZE SPONGE 4X4 12PLY STRL (GAUZE/BANDAGES/DRESSINGS) IMPLANT
GLOVE BIO SURGEON STRL SZ7 (GLOVE) ×1 IMPLANT
GLOVE BIO SURGEON STRL SZ7.5 (GLOVE) ×4 IMPLANT
GLOVE BIOGEL PI IND STRL 7.5 (GLOVE) ×2 IMPLANT
GLOVE BIOGEL PI INDICATOR 7.5 (GLOVE) ×3
GOWN STRL REUS W/ TWL LRG LVL3 (GOWN DISPOSABLE) ×4 IMPLANT
GOWN STRL REUS W/ TWL XL LVL3 (GOWN DISPOSABLE) IMPLANT
GOWN STRL REUS W/TWL 2XL LVL3 (GOWN DISPOSABLE) IMPLANT
GOWN STRL REUS W/TWL LRG LVL3 (GOWN DISPOSABLE) ×10
GOWN STRL REUS W/TWL XL LVL3 (GOWN DISPOSABLE)
GRAFT BN 10X1XDBM MAGNIFUSE (Bone Implant) IMPLANT
GRAFT BONE MAGNIFUSE 1X10CM (Bone Implant) ×2 IMPLANT
HEMOSTAT POWDER KIT SURGIFOAM (HEMOSTASIS) ×2 IMPLANT
KIT BASIN OR (CUSTOM PROCEDURE TRAY) ×2 IMPLANT
KIT POSITION SURG JACKSON T1 (MISCELLANEOUS) ×2 IMPLANT
KIT TURNOVER KIT B (KITS) ×2 IMPLANT
MILL MEDIUM DISP (BLADE) ×1 IMPLANT
NEEDLE HYPO 22GX1.5 SAFETY (NEEDLE) ×2 IMPLANT
NS IRRIG 1000ML POUR BTL (IV SOLUTION) ×2 IMPLANT
PACK LAMINECTOMY NEURO (CUSTOM PROCEDURE TRAY) ×2 IMPLANT
PAD ARMBOARD 7.5X6 YLW CONV (MISCELLANEOUS) ×6 IMPLANT
ROD 5.5MM SPINAL SOLERA (Rod) ×1 IMPLANT
SCREW LUM MA SOLERA 5.5X35 (Screw) ×3 IMPLANT
SCREW SET SOLERA (Screw) ×14 IMPLANT
SCREW SET SOLERA TI5.5 (Screw) IMPLANT
SCREW SPINAL 4.5X35MM COBALT (Screw) ×4 IMPLANT
SEALANT ADHERUS EXTEND TIP (MISCELLANEOUS) ×1 IMPLANT
SPONGE LAP 4X18 RFD (DISPOSABLE) IMPLANT
SPONGE SURGIFOAM ABS GEL 100 (HEMOSTASIS) IMPLANT
STRIP CLOSURE SKIN 1/2X4 (GAUZE/BANDAGES/DRESSINGS) IMPLANT
SUT MNCRL AB 3-0 PS2 18 (SUTURE) ×2 IMPLANT
SUT PROLENE 6 0 BV (SUTURE) ×1 IMPLANT
SUT VIC AB 0 CT1 18XCR BRD8 (SUTURE) ×1 IMPLANT
SUT VIC AB 0 CT1 8-18 (SUTURE) ×6
SUT VIC AB 2-0 CP2 18 (SUTURE) ×4 IMPLANT
TOWEL GREEN STERILE (TOWEL DISPOSABLE) ×2 IMPLANT
TOWEL GREEN STERILE FF (TOWEL DISPOSABLE) ×2 IMPLANT
TRAY FOLEY MTR SLVR 16FR STAT (SET/KITS/TRAYS/PACK) ×2 IMPLANT
WATER STERILE IRR 1000ML POUR (IV SOLUTION) ×2 IMPLANT

## 2018-12-13 NOTE — Progress Notes (Signed)
PT Cancellation Note  Patient Details Name: [name] MRN: [id number] DOB: 14-Mar-1941   Cancelled Treatment:    Reason Eval/Treat Not Completed: Patient at procedure or test/unavailable, will attempt another day.   Reginia Naas 12/13/2018, 1:58 PM  Magda Kiel, [place] [phone] 12/13/2018

## 2018-12-13 NOTE — Progress Notes (Signed)
PROGRESS NOTE    [name]  EXB:[id number] DOB: May 10, 1941 DOA: 12/04/2018 PCP: [name], MD   Brief Narrative:  77 year old with history of osteoarthritis, degenerative disc disease, CHF, hypertension, hyperlipidemia, paroxysmal atrial fibrillation was brought to the hospital for generalized weakness but also lower backache.  She had extensive work-up done including CT of the head which was negative.  CT of the cervical spine which was negative.  X-ray of the left femur, right femur, pelvis which was all negative.  CT of the abdomen pelvis showed concerns for possible developing acute pancreatitis, large amount of stool in the colon with stercoral colitis.  MRI of the lumbar spine showed discitis/osteomyelitis with concerns of abscess versus old hematoma.  Neurosurgery team was consulted. Plans for OR 7/9.   Assessment & Plan:   Principal Problem:   Spinal stenosis Active Problems:   Vertebral osteomyelitis (HCC)   AKI (acute kidney injury) ([place])   UTI (urinary tract infection)  Thoracolumbar vertebral osteomyelitis with fluid collection concerning for abscess versus old hematoma - At this time would continue IV antibiotics.  Dr Zada Finders from [facility] - plans for OR today. Spoke with her daugther Maudie Mercury as well.  -Infectious disease recommending oral doxycycline until seen outpatient by Dr. Prince Rome on 12/25/2018.Infectious disease has signed off  Radicular/neuropathic pain in bilateral lower extremities secondary to severe spinal stenosis - On gabapentin 600 mg twice daily. - Dopplers negative for DVT -On Robaxin 500 mg twice daily.  Pseudomonas UTI, POA - Contamination?.  On doxycycline per infectious disease  Essential hypertension -Continue Toprol-XL 100 mg daily.  Paroxysmal atrial fibrillation -On Toprol-XL.  Coumadin is currently on hold due to concerns for possibly going for surgery.  Will need to determine when will it be ok to resume.   Stercoral colitis  -Secondary to large amount of stool in the rectum.  Aggressive bowel regimen.  No evidence of infection.  Physical therapy is recommending skilled nursing facility  DVT prophylaxis: SCDs Code Status: Full code Family Communication: None at bedside Disposition Plan: Surgical intervention today.   Consultants:   Neurosurgery  Infectious disease-signed off  Procedures:   None  Antimicrobials:   Doxycycline   Subjective: Feels better, but continues to have same complaints overall. Spoke with her daughter as well.   Review of Systems Otherwise negative except as per HPI, including: General = no fevers, chills, dizziness, malaise, fatigue HEENT/EYES = negative for pain, redness, loss of vision, double vision, blurred vision, loss of hearing, sore throat, hoarseness, dysphagia Cardiovascular= negative for chest pain, palpitation, murmurs, lower extremity swelling Respiratory/lungs= negative for shortness of breath, cough, hemoptysis, wheezing, mucus production Gastrointestinal= negative for nausea, vomiting,, abdominal pain, melena, hematemesis Genitourinary= negative for Dysuria, Hematuria, Change in Urinary Frequency MSK = Negative for arthralgia, myalgias, , Joint swelling  Neurology= Negative for headache, seizures, numbness, tingling  Psychiatry= Negative for anxiety, depression, suicidal and homocidal ideation Allergy/Immunology= Medication/Food allergy as listed  Skin= Negative for Rash, lesions, ulcers, itching    Objective: Vitals:   12/12/18 2351 12/13/18 0343 12/13/18 0741 12/13/18 1152  BP: (!) 127/54 (!) 128/54 (!) 182/85 (!) 156/74  Pulse: 61 63 77 73  Resp:   14 16  Temp: 98.4 F (36.9 C) 98 F (36.7 C) 97.8 F (36.6 C) 97.6 F (36.4 C)  TempSrc: Oral Oral Oral Oral  SpO2:  100% 100% 100%    Intake/Output Summary (Last 24 hours) at 12/13/2018 1200 Last data filed at 12/12/2018 1718 Gross per 24 hour  Intake 240  ml  Output -  Net 240 ml   There  were no vitals filed for this visit.  Examination:  Constitutional: NAD, calm, comfortable, elderly frail Eyes: PERRL, lids and conjunctivae normal ENMT: Mucous membranes are moist. Posterior pharynx clear of any exudate or lesions.Normal dentition.  Neck: normal, supple, no masses, no thyromegaly Respiratory: clear to auscultation bilaterally, no wheezing, no crackles. Normal respiratory effort. No accessory muscle use.  Cardiovascular: Regular rate and rhythm, no murmurs / rubs / gallops. No extremity edema. 2+ pedal pulses. No carotid bruits.  Abdomen: no tenderness, no masses palpated. No hepatosplenomegaly. Bowel sounds positive.  Musculoskeletal: no clubbing / cyanosis. No joint deformity upper and lower extremities. Good ROM, no contractures. Normal muscle tone.  Skin: no rashes, lesions, ulcers. No induration Neurologic: CN 2-12 grossly intact. Sensation intact, DTR normal. Strength 4/5 in all 4 especially lower extremities Psychiatric: Alert awake oriented X2.  Somewhat poor judgment and insight.    Data Reviewed:   CBC: Recent Labs  Lab 12/08/18 0537 12/13/18 0142  WBC 5.1 4.9  NEUTROABS 4.1  --   HGB 10.3* 10.4*  HCT 32.7* 32.7*  MCV 86.1 86.1  PLT 151 856   Basic Metabolic Panel: Recent Labs  Lab 12/08/18 0537 12/13/18 0142  NA 139 137  K 3.7 3.1*  CL 106 106  CO2 25 22  GLUCOSE 95 91  BUN 14 18  CREATININE 0.81 0.74  CALCIUM 8.9 9.1  MG  --  1.5*   GFR: CrCl cannot be calculated (Unknown ideal weight.). Liver Function Tests: Recent Labs  Lab 12/08/18 0537 12/13/18 0142  AST 14* 12*  ALT 6 6  ALKPHOS 82 75  BILITOT 0.8 0.7  PROT 5.6* 5.1*  ALBUMIN 2.8* 2.8*   Recent Labs  Lab 12/08/18 0537  LIPASE 84*   No results for input(s): AMMONIA in the last 168 hours. Coagulation Profile: Recent Labs  Lab 12/09/18 0145 12/10/18 0641 12/11/18 0810 12/12/18 0521 12/13/18 0142  INR 1.7* 1.5* 1.4* 1.3* 1.3*   Cardiac Enzymes: No results for  input(s): CKTOTAL, CKMB, CKMBINDEX, TROPONINI in the last 168 hours. BNP (last 3 results) No results for input(s): PROBNP in the last 8760 hours. HbA1C: No results for input(s): HGBA1C in the last 72 hours. CBG: Recent Labs  Lab 12/11/18 0736 12/11/18 1147 12/11/18 1619 12/11/18 2141 12/12/18 0818  GLUCAP 102* 95 106* 87 91   Lipid Profile: No results for input(s): CHOL, HDL, LDLCALC, TRIG, CHOLHDL, LDLDIRECT in the last 72 hours. Thyroid Function Tests: No results for input(s): TSH, T4TOTAL, FREET4, T3FREE, THYROIDAB in the last 72 hours. Anemia Panel: No results for input(s): VITAMINB12, FOLATE, FERRITIN, TIBC, IRON, RETICCTPCT in the last 72 hours. Sepsis Labs: No results for input(s): PROCALCITON, LATICACIDVEN in the last 168 hours.  Recent Results (from the past 240 hour(s))  Blood culture (routine x 2)     Status: None   Collection Time: 12/04/18  8:05 PM   Specimen: BLOOD  Result Value Ref Range Status   Specimen Description BLOOD LEFT ANTECUBITAL  Final   Special Requests   Final    BOTTLES DRAWN AEROBIC AND ANAEROBIC Blood Culture results may not be optimal due to an excessive volume of blood received in culture bottles   Culture   Final    NO GROWTH 5 DAYS Performed at [facility], [place] [address], [place], [place] [address]    Report Status 12/09/2018 FINAL  Final  Blood culture (routine x 2)  Status: None   Collection Time: 12/04/18  8:07 PM   Specimen: BLOOD  Result Value Ref Range Status   Specimen Description BLOOD RIGHT ANTECUBITAL  Final   Special Requests   Final    BOTTLES DRAWN AEROBIC AND ANAEROBIC Blood Culture results may not be optimal due to an inadequate volume of blood received in culture bottles   Culture   Final    NO GROWTH 5 DAYS Performed at [facility], [place] [address], [place], [place] [address]    Report Status 12/09/2018 FINAL  Final  SARS Coronavirus 2 (CEPHEID - Performed in [name] hospital lab), Hosp Order      Status: None   Collection Time: 12/05/18 12:30 AM   Specimen: Nasopharyngeal Swab  Result Value Ref Range Status   SARS Coronavirus 2 NEGATIVE NEGATIVE Final    Comment: (NOTE) If result is NEGATIVE SARS-CoV-2 target nucleic acids are NOT DETECTED. The SARS-CoV-2 RNA is generally detectable in upper and lower  respiratory specimens during the acute phase of infection. The lowest  concentration of SARS-CoV-2 viral copies this assay can detect is 250  copies / mL. A negative result does not preclude SARS-CoV-2 infection  and should not be used as the sole basis for treatment or other  patient management decisions.  A negative result may occur with  improper specimen collection / handling, submission of specimen other  than nasopharyngeal swab, presence of viral mutation(s) within the  areas targeted by this assay, and inadequate number of viral copies  (<250 copies / mL). A negative result must be combined with clinical  observations, patient history, and epidemiological information. If result is POSITIVE SARS-CoV-2 target nucleic acids are DETECTED. The SARS-CoV-2 RNA is generally detectable in upper and lower  respiratory specimens dur ing the acute phase of infection.  Positive  results are indicative of active infection with SARS-CoV-2.  Clinical  correlation with patient history and other diagnostic information is  necessary to determine patient infection status.  Positive results do  not rule out bacterial infection or co-infection with other viruses. If result is PRESUMPTIVE POSTIVE SARS-CoV-2 nucleic acids MAY BE PRESENT.   A presumptive positive result was obtained on the submitted specimen  and confirmed on repeat testing.  While 2019 novel coronavirus  (SARS-CoV-2) nucleic acids may be present in the submitted sample  additional confirmatory testing may be necessary for epidemiological  and / or clinical management purposes  to differentiate between  SARS-CoV-2 and other  Sarbecovirus currently known to infect humans.  If clinically indicated additional testing with an alternate test  methodology [phone]) is advised. The SARS-CoV-2 RNA is generally  detectable in upper and lower respiratory sp ecimens during the acute  phase of infection. The expected result is Negative. Fact Sheet for Patients:  StrictlyIdeas.no Fact Sheet for Healthcare Providers: BankingDealers.co.za This test is not yet approved or cleared by the [place] FDA and has been authorized for detection and/or diagnosis of SARS-CoV-2 by FDA under an Emergency Use Authorization (EUA).  This EUA will remain in effect (meaning this test can be used) for the duration of the COVID-19 declaration under Section 564(b)(1) of the Act, 21 U.S.C. section 360bbb-3(b)(1), unless the authorization is terminated or revoked sooner. Performed at [facility], [place] [address], [place], [place] [address]   Culture, Urine     Status: Abnormal   Collection Time: 12/06/18  7:09 PM   Specimen: Urine, Catheterized  Result Value Ref Range Status   Specimen  Description URINE, CATHETERIZED  Final   Special Requests   Final    NONE Performed at [facility], [place] [address], [place], [place] [address]    Culture >=100,000 COLONIES/mL PSEUDOMONAS AERUGINOSA (A)  Final   Report Status 12/08/2018 FINAL  Final   Organism ID, Bacteria PSEUDOMONAS AERUGINOSA (A)  Final      Susceptibility   Pseudomonas aeruginosa - MIC*    CEFTAZIDIME 16 INTERMEDIATE Intermediate     CIPROFLOXACIN 1 SENSITIVE Sensitive     GENTAMICIN <=1 SENSITIVE Sensitive     IMIPENEM <=0.25 SENSITIVE Sensitive     PIP/TAZO 32 SENSITIVE Sensitive     CEFEPIME INTERMEDIATE Intermediate     * >=100,000 COLONIES/mL PSEUDOMONAS AERUGINOSA  MRSA PCR Screening     Status: None   Collection Time: 12/08/18 12:13 PM   Specimen: Nasal Mucosa; Nasopharyngeal  Result Value Ref Range Status    MRSA by PCR NEGATIVE NEGATIVE Final    Comment:        The GeneXpert MRSA Assay (FDA approved for NASAL specimens only), is one component of a comprehensive MRSA colonization surveillance program. It is not intended to diagnose MRSA infection nor to guide or monitor treatment for MRSA infections. Performed at [facility], [place] [address] [place], [place] [address]          Radiology Studies: No results found.      Scheduled Meds: . bisacodyl  10 mg Rectal Daily  . carbidopa-levodopa  2 tablet Oral TID  . doxycycline  100 mg Oral BID  . enoxaparin (LOVENOX) injection  40 mg Subcutaneous Q24H  . flecainide  50 mg Oral BID  . gabapentin  600 mg Oral BID  . mouth rinse  15 mL Mouth Rinse BID  . methocarbamol  500 mg Oral BID  . metoprolol succinate  100 mg Oral Daily  . pantoprazole  40 mg Oral BID  . polyethylene glycol  17 g Oral Daily  . potassium chloride  40 mEq Oral Once  . pramipexole  1 mg Oral QHS  . pravastatin  40 mg Oral QHS   Continuous Infusions: . potassium chloride 10 mEq (12/13/18 1110)     LOS: 8 days   Time spent= 35 mins    Ankit Arsenio Loader, MD [facility]  If 7PM-7AM, please contact night-coverage [email or web address] 12/13/2018, 12:00 PM

## 2018-12-13 NOTE — NC FL2 (Signed)
[place] LEVEL OF CARE SCREENING TOOL     IDENTIFICATION  Patient Name: [name] Birthdate: [birth year] Sex: female Admission Date (Current Location): 12/04/2018  [facility] and [place] Number:  Herbalist and Address:  [facility]. [facility], [place] [address] [place], [place] [address]      Provider Number: [id number]  Attending Physician Name and Address:  Damita Lack, MD  Relative Name and Phone Number:       Current Level of Care: Hospital Recommended Level of Care: [place] Prior Approval Number:    Date Approved/Denied:   PASRR Number:   [id number]  Discharge Plan: SNF    Current Diagnoses: Patient Active Problem List   Diagnosis Date Noted  . UTI (urinary tract infection) 12/06/2018  . Vertebral osteomyelitis ([place]) 12/05/2018  . Spinal stenosis 12/05/2018  . AKI (acute kidney injury) ([place]) 12/05/2018  . Acute metabolic encephalopathy 56/21/3086  . Chronic diastolic (congestive) heart failure ([place]) 10/07/2018  . GERD (gastroesophageal reflux disease) 10/07/2018  . Abnormal glucose tolerance test 07/01/2018  . Colon, diverticulosis 07/01/2018  . 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 Glaucoma 07/01/2018  . Chronic kidney disease (CKD), stage III (moderate) ([place]) 07/01/2018  . Neuropathy 07/01/2018  . Pure hypercholesterolemia 07/01/2018  . Restless leg 07/01/2018  . Discitis of thoracolumbar region 07/01/2018  . Acute bronchitis due to infection 05/04/2017  . Cough 04/21/2016  . Preoperative cardiovascular examination 04/18/2015  . Parkinsonism ([place]) 05/15/2014  . Chronic anticoagulation -warfarin therapy 02/03/2014  . Atrial fibrillation with RVR ([place]) 01/31/2014  . Gait instability 11/16/2013  . Encounter for therapeutic drug monitoring 07/20/2013  . Edema 03/03/2010  . ATRIAL FIBRILLATION 04/23/2008  . Hyperlipidemia 01/31/2008  . Obstructive sleep apnea 01/31/2008  . GLAUCOMA 01/30/2008  .  Seasonal and perennial allergic rhinitis 01/30/2008  . Osteoarthritis 01/30/2008    Orientation RESPIRATION BLADDER Height & Weight     Self, Time, Situation, Place  Normal Incontinent, External catheter Weight:   Height:     BEHAVIORAL SYMPTOMS/MOOD NEUROLOGICAL BOWEL NUTRITION STATUS        Diet(please see DC summary)  AMBULATORY STATUS COMMUNICATION OF NEEDS Skin   Extensive Assist Verbally Normal                       Personal Care Assistance Level of Assistance  Feeding, Bathing, Dressing Bathing Assistance: Maximum assistance Feeding assistance: Limited assistance Dressing Assistance: Maximum assistance     Functional Limitations Info             SPECIAL CARE FACTORS FREQUENCY  PT (By licensed PT), OT (By licensed OT)     PT Frequency: 5 OT Frequency: 5            Contractures      Additional Factors Info  Code Status, Allergies Code Status Info: Full code Allergies Info: Lyrica (Pregabalin) Zocor (Simvastatin) Relafen (Nabumetone)           Current Medications (12/13/2018):  This is the current hospital active medication list Current Facility-Administered Medications  Medication Dose Route Frequency Provider Last Rate Last Dose  . acetaminophen (TYLENOL) tablet 650 mg  650 mg Oral Q6H PRN Shela Leff, MD   650 mg at 12/11/18 0827   Or  . acetaminophen (TYLENOL) suppository 650 mg  650 mg Rectal Q6H PRN Shela Leff, MD      . alum & mag hydroxide-simeth (MAALOX/MYLANTA) 200-200-20 MG/5ML suspension 30 mL  30 mL Oral Q4H PRN  Amin, Ankit Chirag, MD      . bisacodyl (DULCOLAX) suppository 10 mg  10 mg Rectal Daily Shela Leff, MD   10 mg at 12/07/18 1154  . carbidopa-levodopa (SINEMET IR) 25-100 MG per tablet immediate release 2 tablet  2 tablet Oral TID Shelly Coss, MD   2 tablet at 12/13/18 2706  . doxycycline (VIBRA-TABS) tablet 100 mg  100 mg Oral BID Mosetta Anis, MD   100 mg at 12/13/18 0831  . enoxaparin (LOVENOX)  injection 40 mg  40 mg Subcutaneous Q24H Darliss Cheney, MD   40 mg at 12/12/18 1600  . flecainide (TAMBOCOR) tablet 50 mg  50 mg Oral BID Shelly Coss, MD   50 mg at 12/13/18 0834  . gabapentin (NEURONTIN) tablet 600 mg  600 mg Oral BID Shelly Coss, MD   600 mg at 12/13/18 0832  . guaiFENesin-dextromethorphan (ROBITUSSIN DM) 100-10 MG/5ML syrup 5 mL  5 mL Oral Q4H PRN Amin, Ankit Chirag, MD      . hydrALAZINE (APRESOLINE) injection 10 mg  10 mg Intravenous Q4H PRN Amin, Ankit Chirag, MD   10 mg at 12/12/18 2000  . HYDROcodone-acetaminophen (NORCO/VICODIN) 5-325 MG per tablet 1-2 tablet  1-2 tablet Oral Q4H PRN Shela Leff, MD   2 tablet at 12/13/18 2376  . hydrocortisone (ANUSOL-HC) 2.5 % rectal cream 1 application  1 application Topical QID PRN Amin, Ankit Chirag, MD      . hydrocortisone cream 1 % 1 application  1 application Topical TID PRN Amin, Ankit Chirag, MD      . ipratropium-albuterol (DUONEB) 0.5-2.5 (3) MG/3ML nebulizer solution 3 mL  3 mL Nebulization Q4H PRN Amin, Ankit Chirag, MD      . lip balm (CARMEX) ointment 1 application  1 application Topical PRN Amin, Ankit Chirag, MD      . liver oil-zinc oxide (DESITIN) 40 % ointment   Topical Q2H PRN Irene Pap N, DO   1 application at 28/31/51 2221  . loratadine (CLARITIN) tablet 10 mg  10 mg Oral Daily PRN Amin, Ankit Chirag, MD      . MEDLINE mouth rinse  15 mL Mouth Rinse BID Shela Leff, MD   15 mL at 12/13/18 0837  . methocarbamol (ROBAXIN) tablet 500 mg  500 mg Oral BID Irene Pap N, DO   500 mg at 12/13/18 7616  . metoprolol succinate (TOPROL-XL) 24 hr tablet 100 mg  100 mg Oral Daily Shelly Coss, MD   100 mg at 12/13/18 0832  . Muscle Rub CREA 1 application  1 application Topical PRN Amin, Ankit Chirag, MD      . pantoprazole (PROTONIX) EC tablet 40 mg  40 mg Oral BID Shelly Coss, MD   40 mg at 12/13/18 0833  . phenol (CHLORASEPTIC) mouth spray 1 spray  1 spray Mouth/Throat PRN Amin, Ankit Chirag,  MD      . polyethylene glycol (MIRALAX / GLYCOLAX) packet 17 g  17 g Oral Daily Shelly Coss, MD   17 g at 12/12/18 0847  . polyethylene glycol (MIRALAX / GLYCOLAX) packet 17 g  17 g Oral Daily PRN Amin, Ankit Chirag, MD      . polyvinyl alcohol (LIQUIFILM TEARS) 1.4 % ophthalmic solution 1 drop  1 drop Both Eyes PRN Amin, Ankit Chirag, MD      . potassium chloride 10 mEq in 100 mL IVPB  10 mEq Intravenous Q1 Hr x 4 Amin, Ankit Chirag, MD 100 mL/hr at 12/13/18 1110 10 mEq at 12/13/18  1110  . potassium chloride SA (K-DUR) CR tablet 40 mEq  40 mEq Oral Once Amin, Ankit Chirag, MD      . pramipexole (MIRAPEX) tablet 1 mg  1 mg Oral QHS Shelly Coss, MD   1 mg at 12/12/18 2117  . pravastatin (PRAVACHOL) tablet 40 mg  40 mg Oral QHS Shelly Coss, MD   40 mg at 12/12/18 2118  . senna-docusate (Senokot-S) tablet 2 tablet  2 tablet Oral QHS PRN Amin, Ankit Chirag, MD      . sodium chloride (OCEAN) 0.65 % nasal spray 1 spray  1 spray Each Nare PRN Amin, Jeanella Flattery, MD         Discharge Medications: Please see discharge summary for a list of discharge medications.  Relevant Imaging Results:  Relevant Lab Results:   Additional [place], LCSW

## 2018-12-13 NOTE — Anesthesia Postprocedure Evaluation (Signed)
Anesthesia Post Note  Patient: [name]  Procedure(s) Performed: THORACIC NINE -LUMBAR THREE POSTERIOR INSTRUMENTATION FUSION (N/A Spine Thoracic)     Patient location during evaluation: PACU Anesthesia Type: General Level of consciousness: sedated Pain management: pain level controlled Vital Signs Assessment: post-procedure vital signs reviewed and stable Respiratory status: spontaneous breathing and respiratory function stable Cardiovascular status: stable Postop Assessment: no apparent nausea or vomiting Anesthetic complications: no    Last Vitals:  Vitals:   12/13/18 1945 12/13/18 2000  BP: 136/69 (!) 142/64  Pulse: 63 63  Resp: (!) 21 20  Temp:    SpO2: 98% 97%    Last Pain:  Vitals:   12/13/18 1945  TempSrc:   PainSc: Asleep                 Anitra Doxtater DANIEL

## 2018-12-13 NOTE — Brief Op Note (Signed)
12/13/2018  7:39 PM  PATIENT:  [name]  78 y.o. female  PRE-OPERATIVE DIAGNOSIS:  lumbar spinal stenosis  POST-OPERATIVE DIAGNOSIS:  lumbar spinal stenosis  PROCEDURE:  Procedure(s): THORACIC NINE -LUMBAR THREE POSTERIOR INSTRUMENTATION FUSION (N/A)  SURGEON:  Surgeon(s) and Role:    * Kawehi Hostetter, Joyice Faster, MD - Primary    * Eustace Moore, MD - Assisting  PHYSICIAN ASSISTANT:   ANESTHESIA:   general  EBL:  425 mL   BLOOD ADMINISTERED:none  DRAINS: Subfascial #7 flat drain with JP bulb   LOCAL MEDICATIONS USED:  LIDOCAINE   SPECIMEN:  No Specimen  DISPOSITION OF SPECIMEN:  N/A  COUNTS:  YES  TOURNIQUET:  * No tourniquets in log *  DICTATION: .Note written in EPIC  PLAN OF CARE: Admit to inpatient   PATIENT DISPOSITION:  PACU - hemodynamically stable.   Delay start of Pharmacological VTE agent (>24hrs) due to surgical blood loss or risk of bleeding: yes

## 2018-12-13 NOTE — Op Note (Signed)
PATIENT: [name]  DAY OF SURGERY: 12/13/18   PRE-OPERATIVE DIAGNOSIS:  Spinal stenosis with instability, thoracic myelopathy   POST-OPERATIVE DIAGNOSIS:  Spinal stenosis with instability, thoracic myelopathy   PROCEDURE:  T11-T12 laminectomies, right T12-L1 complete facetectomy, right T12 transpedicular decompression of calcified ventral mass, T9-L3 posterior spinal instrumented fusion   SURGEON:  Surgeon(s) and Role:    Judith Part, MD - Primary    Sherley Bounds, MD - Assisting   ANESTHESIA: ETGA   BRIEF HISTORY: This is a 78 year old woman in whom I previously performed decompression of an epidural abscess. At the level of discitis, she had progression of calcified hypertrophic ligament and/or disc that led to worsening spinal stenosis and significant leg weakness. Her course has been complicated by new altered mental status that is thought to be related to her pain and the pain medications she is requiring. Given her severe leg weakness, I discussed with the patient and her family that decompression and fusion is the only viable option that I see to attempt to prevent worsening of her leg function and giving her an opportunity to improve. We discussed at length that she will likely have a difficult post-op course and recovery and they wished to proceed with surgery.    OPERATIVE DETAIL: The patient was taken to the operating room and anesthesia was induced by the anesthesia team. They were placed on the OR table in the prone position with padding of all pressure points. A formal time out was performed with two patient identifiers and confirmed the operative site. The operative site was marked, hair was clipped with surgical clippers, the area was then prepped and draped in a sterile fashion. Fluoro was used to localize the operative level and a midline incision was placed to expose from T9 to L3. Subperiosteal dissection was performed bilaterally and fluoroscopy was again used  to confirm the surgical level. Given her multiple prior surgeries, there were bony defects at multiple levels that made this dissection more difficult, along with the expected dense scar tissue.   Decompression was performed, which consisted of completion of her prior laminectomy at T11 and T12. T12-L1 was grossly unstable and I was able to remove the bilateral IAPs without any dissection. A complete facetectomy was performed on the right at T12-L1 and the right T12 pedicle was removed to allow maximal space during decompression without manipulating the thecal sac. The large calcified discoligamentous mass was identified and visualized compressing the thecal sac. This was as difficult to remove as I expected, given the inability to manipulate the thecal sac.The widened corridor allowed me a greater degree of freedom to remove this mass without manipulating the thecal sac, but required a concerted effort to safely remove. It appeared most consistent with calcified disc material. The thecal sac was completely decompressed along the entire length of the posterior body of T12 down past the T12-L1 disc space. I then dissected on the contralateral side to place a right angle probe into the ventral epidural space to confirm no remaining tissue was present. There did appear to be a CSF leak from the inferior nerve root sleeve of the right T12 nerve root. This stopped spontaneously, but at the end of the case I covered the area with Adheris (Stryker) fibrin glue in case it began to leak again.   Instrumentation was then performed. Fluoroscopy was used to guide placement of bilateral pedicle screws (Medtronic) at T9, T10, T11, and on the left at T12. These were placed  by localizing the pedicle with standard landmarks, using fluoroscopy to confirm the correct starting point and trajectory, drilling a pilot hole, cannulating the pedicle with an awl, palpating for pedicle wall breaches, tapping, palpating, and then placing  the screw. The screws were checked with fluoroscopy to confirm position. The prior side connector was removed along with the short-segment rod going to the L1 screws. The cross-link was removed and the rod was then cut below L3 and removed from the L2 and L3 screw heads. A new rod was then bent and placed from the T9 to L3 screws and final tightened according to manufacturer torque specifications. The bone was thoroughly decorticated over the fusion surface from T9 to L3 and the previously resected bone fragments were morselized and used as autograft. Magnafuse (Medtronic) allograft bone baskets were used along the posterolateral fusion surface from T11 to L1 due to the large span of that area.    A drain was placed subfascially, tunneled, and secured. All instrument and sponge counts were correct, hemostasis was obtained and the incision was then closed in layers. The patient was then returned to anesthesia for emergence. No apparent complications at the completion of the procedure.   EBL:  441mL   DRAINS: none   SPECIMENS: none   Judith Part, MD 12/13/18 2:06 PM

## 2018-12-13 NOTE — Anesthesia Preprocedure Evaluation (Addendum)
Anesthesia Evaluation  Patient identified by MRN, date of birth, ID band Patient awake    Reviewed: Allergy & Precautions, NPO status , Patient's Chart, lab work & pertinent test results  History of Anesthesia Complications Negative for: history of anesthetic complications  Airway Mallampati: II  TM Distance: >3 FB Neck ROM: Full    Dental  (+) Dental Advisory Given, Teeth Intact   Pulmonary sleep apnea ,    breath sounds clear to auscultation       Cardiovascular hypertension, Pt. on medications and Pt. on home beta blockers (-) angina+CHF  + dysrhythmias Atrial Fibrillation  Rhythm:Regular Rate:Normal     Neuro/Psych  Neuromuscular disease (radiculopathy) negative psych ROS   GI/Hepatic Neg liver ROS, GERD  Medicated and Controlled,  Endo/Other  negative endocrine ROS  Renal/GU CRFRenal disease     Musculoskeletal  (+) Arthritis ,  Scoliosis    Abdominal   Peds  Hematology negative hematology ROS (+)   Anesthesia Other Findings Hx epidural abscess 09/2018 s/p laminectomy    Reproductive/Obstetrics                            Anesthesia Physical Anesthesia Plan  ASA: III  Anesthesia Plan: General   Post-op Pain Management:    Induction: Intravenous  PONV Risk Score and Plan: 4 or greater and Treatment may vary due to age or medical condition, Ondansetron, Propofol infusion and Dexamethasone  Airway Management Planned: Oral ETT  Additional Equipment: None  Intra-op Plan:   Post-operative Plan: Extubation in OR  Informed Consent: I have reviewed the patients History and Physical, chart, labs and discussed the procedure including the risks, benefits and alternatives for the proposed anesthesia with the patient or authorized representative who has indicated his/her understanding and acceptance.     Dental advisory given  Plan Discussed with: CRNA and  Anesthesiologist  Anesthesia Plan Comments:        Anesthesia Quick Evaluation

## 2018-12-13 NOTE — Transfer of Care (Signed)
Immediate Anesthesia Transfer of Care Note  Patient: [name]  Procedure(s) Performed: THORACIC NINE -LUMBAR THREE POSTERIOR INSTRUMENTATION FUSION (N/A Spine Thoracic)  Patient Location: PACU  Anesthesia Type:General  Level of Consciousness: drowsy, patient cooperative and responds to stimulation  Airway & Oxygen Therapy: Patient Spontanous Breathing  Post-op Assessment: Report given to RN and Post -op Vital signs reviewed and stable  Post vital signs: Reviewed and stable  Last Vitals:  Vitals Value Taken Time  BP    Temp    Pulse    Resp    SpO2      Last Pain:  Vitals:   12/13/18 1152  TempSrc: Oral  PainSc:       Patients Stated Pain Goal: 2 (26/37/85 8850)  Complications: No apparent anesthesia complications

## 2018-12-13 NOTE — Anesthesia Procedure Notes (Signed)
Procedure Name: Intubation Date/Time: 12/13/2018 2:01 PM Performed by: Kyung Rudd, CRNA Pre-anesthesia Checklist: Patient identified, Emergency Drugs available, Suction available, Patient being monitored and Timeout performed Patient Re-evaluated:Patient Re-evaluated prior to induction Oxygen Delivery Method: Circle system utilized Preoxygenation: Pre-oxygenation with 100% oxygen Induction Type: IV induction Ventilation: Mask ventilation without difficulty Laryngoscope Size: Mac and 3 Grade View: Grade I Tube type: Oral Tube size: 7.0 mm Number of attempts: 1 Airway Equipment and Method: Stylet Placement Confirmation: ETT inserted through vocal cords under direct vision,  positive ETCO2 and breath sounds checked- equal and bilateral Secured at: 21 cm Tube secured with: Tape Dental Injury: Teeth and Oropharynx as per pre-operative assessment

## 2018-12-14 ENCOUNTER — Encounter (HOSPITAL_COMMUNITY): Payer: Self-pay | Admitting: Neurological Surgery

## 2018-12-14 LAB — PROTIME-INR
INR: 1.2 (ref 0.8–1.2)
Prothrombin Time: 15.4 seconds — ABNORMAL HIGH (ref 11.4–15.2)

## 2018-12-14 LAB — COMPREHENSIVE METABOLIC PANEL
ALT: 8 U/L (ref 0–44)
AST: 15 U/L (ref 15–41)
Albumin: 2.5 g/dL — ABNORMAL LOW (ref 3.5–5.0)
Alkaline Phosphatase: 75 U/L (ref 38–126)
Anion gap: 7 (ref 5–15)
BUN: 15 mg/dL (ref 8–23)
CO2: 24 mmol/L (ref 22–32)
Calcium: 8.7 mg/dL — ABNORMAL LOW (ref 8.9–10.3)
Chloride: 107 mmol/L (ref 98–111)
Creatinine, Ser: 0.78 mg/dL (ref 0.44–1.00)
GFR calc Af Amer: >60 mL/min (ref >60)
GFR calc non Af Amer: >60 mL/min (ref >60)
Glucose, Bld: 126 mg/dL — ABNORMAL HIGH (ref 70–99)
Potassium: 4 mmol/L (ref 3.5–5.1)
Sodium: 138 mmol/L (ref 135–145)
Total Bilirubin: 0.4 mg/dL (ref 0.3–1.2)
Total Protein: 4.8 g/dL — ABNORMAL LOW (ref 6.5–8.1)

## 2018-12-14 LAB — CBC
HCT: 30.2 % — ABNORMAL LOW (ref 36.0–46.0)
Hemoglobin: 9.5 g/dL — ABNORMAL LOW (ref 12.0–15.0)
MCH: 26.9 pg (ref 26.0–34.0)
MCHC: 31.5 g/dL (ref 30.0–36.0)
MCV: 85.6 fL (ref 80.0–100.0)
Platelets: 198 10*3/uL (ref 150–400)
RBC: 3.53 MIL/uL — ABNORMAL LOW (ref 3.87–5.11)
RDW: 15.6 % — ABNORMAL HIGH (ref 11.5–15.5)
WBC: 7.9 10*3/uL (ref 4.0–10.5)
nRBC: 0 % (ref 0.0–0.2)

## 2018-12-14 LAB — MAGNESIUM: Magnesium: 1.8 mg/dL (ref 1.7–2.4)

## 2018-12-14 MED ORDER — SODIUM CHLORIDE 0.9 % IV BOLUS
1000.0000 mL | Freq: Once | INTRAVENOUS | Status: AC
  Administered 2018-12-14: 1000 mL via INTRAVENOUS

## 2018-12-14 NOTE — Progress Notes (Signed)
Occupational Therapy Treatment Patient Details Name: [name] MRN: [id number] DOB: 1941-05-16 Today's Date: 12/14/2018    History of present illness 78 y.o. female with prior epidural abscess, now with continued back pain, admission workup notable for UTI and pancreatitis. MRI of lumbar spine done December 02, 2018 showing T12-L1 discitis-osteomyelitis with progressive disc space collapse and mildly progressive endplate erosion. Admitted with vertebral osteo, epidural abscess and spinal stenosis secondary to disc extrusion at T12. PMH including arthritis, DJD, CHF, HTN, hyperlipidemia, OSA, Parkinson's disease, and paroxysmal atrial fibrillation. 7/8, underwent total facetectomy, revision of all construct and new fusion of T9-L3.   OT comments  Pt progressing towards OT goals this session. Pt is total A for peri care at bed level (Pt found in BM), mod A +2 for safety with bed mobility for lift pad placement. Therapy team utilized maximove to lift Pt from bed to recliner. In seated position, Pt was able to self-feed after having food cut up for her with regular utensils. Pt less impacted by pain from previous sessions. Cognition remains impaired. POC remains appropriate - Pt will continue to benefit from skilled acute care OT and SNF afterwards.    Follow Up Recommendations  SNF;Supervision/Assistance - 24 hour    Equipment Recommendations  Other (comment)(defer to next venue of care)    Recommendations for Other Services      Precautions / Restrictions Precautions Precautions: Fall;Back Precaution Booklet Issued: No Precaution Comments: Reviewed back precautions Restrictions Weight Bearing Restrictions: No       Mobility Bed Mobility Overal bed mobility: Needs Assistance Bed Mobility: Rolling Rolling: Mod assist;+2 for safety/equipment         General bed mobility comments: cues for sequencing with log roll, use of bed to assist  Transfers Overall transfer level: Needs  assistance Equipment used: Ambulation equipment used             General transfer comment: +2 to maximove over from bed to recliner - Pt tolerated well    Balance Overall balance assessment: Needs assistance Sitting-balance support: Bilateral upper extremity supported;Feet supported Sitting balance-Leahy Scale: Poor                                     ADL either performed or assessed with clinical judgement   ADL Overall ADL's : Needs assistance/impaired Eating/Feeding: Sitting;Minimal assistance Eating/Feeding Details (indicate cue type and reason): once food is cut up, Pt able to self-feed Grooming: Wash/dry hands;Wash/dry face;Minimal assistance;Sitting Grooming Details (indicate cue type and reason): in recliner             Lower Body Dressing: Total assistance;Bed level Lower Body Dressing Details (indicate cue type and reason): Total A for donning socks bed level   Toilet Transfer Details (indicate cue type and reason): requires lift at this time Toileting- Water quality scientist and Hygiene: Total assistance;Bed level;+2 for physical assistance Toileting - Clothing Manipulation Details (indicate cue type and reason): bed level             Vision       Perception     Praxis      Cognition Arousal/Alertness: Awake/alert Behavior During Therapy: Flat affect Overall Cognitive Status: Impaired/Different from baseline Area of Impairment: Attention;Memory;Following commands;Safety/judgement;Awareness;Problem solving;Orientation                 Orientation Level: Disoriented to;Situation Current Attention Level: Focused Memory: Decreased short-term memory;Decreased recall of precautions Following  Commands: Follows one step commands with increased time;Follows one step commands inconsistently Safety/Judgement: Decreased awareness of safety;Decreased awareness of deficits Awareness: Intellectual Problem Solving: Slow processing;Requires  verbal cues;Difficulty sequencing General Comments: pain not as limiting today. seems to have memorized orientation questions - because functionally cannot retain back precaution education or safety awareness        Exercises     Shoulder Instructions       General Comments VSS    Pertinent Vitals/ Pain       Pain Assessment: Faces Faces Pain Scale: Hurts even more Pain Location: back Pain Descriptors / Indicators: Grimacing;Guarding;Discomfort Pain Intervention(s): Monitored during session;Repositioned  Home Living                                          Prior Functioning/Environment              Frequency  Min 2X/week        Progress Toward Goals  OT Goals(current goals can now be found in the care plan section)  Progress towards OT goals: Progressing toward goals  Acute Rehab OT Goals Patient Stated Goal: not stated OT Goal Formulation: With patient Time For Goal Achievement: 12/23/18 Potential to Achieve Goals: Good  Plan Discharge plan remains appropriate;Frequency remains appropriate    Co-evaluation    PT/OT/SLP Co-Evaluation/Treatment: Yes Reason for Co-Treatment: Necessary to address cognition/behavior during functional activity;For patient/therapist safety;To address functional/ADL transfers PT goals addressed during session: Balance OT goals addressed during session: ADL's and self-care      AM-PAC OT "6 Clicks" Daily Activity     Outcome Measure   Help from another person eating meals?: A Little Help from another person taking care of personal grooming?: A Little Help from another person toileting, which includes using toliet, bedpan, or urinal?: Total Help from another person bathing (including washing, rinsing, drying)?: A Lot Help from another person to put on and taking off regular upper body clothing?: A Lot Help from another person to put on and taking off regular lower body clothing?: Total 6 Click Score: 12     End of Session Equipment Utilized During Treatment: Gait belt  OT Visit Diagnosis: Other abnormalities of gait and mobility (R26.89);Unsteadiness on feet (R26.81);Muscle weakness (generalized) (M62.81);Pain;Other symptoms and signs involving cognitive function Pain - part of body: (back)   Activity Tolerance Patient tolerated treatment well   Patient Left in chair;with call bell/phone within reach;with chair alarm set   Nurse Communication Mobility status;Need for lift equipment        Time: 5621-3086 OT Time Calculation (min): 36 min  Charges: OT General Charges $OT Visit: 1 Visit OT Treatments $Self Care/Home Management : 8-22 mins  Hulda Humphrey OTR/L Acute Rehabilitation Services Pager: [phone] Office: [place] 12/14/2018, 2:28 PM

## 2018-12-14 NOTE — Evaluation (Addendum)
Physical Therapy Re-Evaluation Patient Details Name: [name] MRN: [id number] DOB: May 21, 1941 Today's Date: 12/14/2018   History of Present Illness  78 y.o. female with prior epidural abscess, now with continued back pain, admission workup notable for UTI and pancreatitis. MRI of lumbar spine done December 02, 2018 showing T12-L1 discitis-osteomyelitis with progressive disc space collapse and mildly progressive endplate erosion. Admitted with vertebral osteo, epidural abscess and spinal stenosis secondary to disc extrusion at T12. PMH including arthritis, DJD, CHF, HTN, hyperlipidemia, OSA, Parkinson's disease, and paroxysmal atrial fibrillation. 7/8, underwent total facetectomy, revision of all construct and new fusion of T9-L3.  Clinical Impression  Pt re-evaluated s/p fusion T9-L3 on 7/8. Pt with seemingly improved pain control, reports none at rest, although still grimacing with certain movements. Continues with confusion, found with bowel incontinence at beginning of session. Requiring totalA for peri care. Utilized maxi move lift to transfer from bed to chair. Able to self feed after having food cut up into small bites. Goals updated. Continue to recommend SNF for ongoing Physical Therapy.       Follow Up Recommendations SNF;Supervision/Assistance - 24 hour    Equipment Recommendations  Other (comment)(defer)    Recommendations for Other Services       Precautions / Restrictions Precautions Precautions: Fall;Back Precaution Booklet Issued: No Precaution Comments: Reviewed back precautions, no brace needed Restrictions Weight Bearing Restrictions: No      Mobility  Bed Mobility Overal bed mobility: Needs Assistance Bed Mobility: Rolling Rolling: +2 for safety/equipment;Max assist         General bed mobility comments: cues for sequencing with log roll, use of bed to assist  Transfers Overall transfer level: Needs assistance Equipment used: Ambulation equipment  used             General transfer comment: +2 to maximove over from bed to recliner - Pt tolerated well  Ambulation/Gait                Stairs            Wheelchair Mobility    Modified Rankin (Stroke Patients Only)       Balance Overall balance assessment: Needs assistance Sitting-balance support: Bilateral upper extremity supported;Feet supported Sitting balance-Leahy Scale: Poor                                       Pertinent Vitals/Pain Pain Assessment: Faces Faces Pain Scale: Hurts even more Pain Location: back Pain Descriptors / Indicators: Grimacing;Guarding;Discomfort Pain Intervention(s): Monitored during session    Home Living Family/patient expects to be discharged to:: Skilled nursing facility                      Prior Function Level of Independence: Needs assistance               Hand Dominance   Dominant Hand: Right    Extremity/Trunk Assessment   Upper Extremity Assessment Upper Extremity Assessment: Generalized weakness    Lower Extremity Assessment Lower Extremity Assessment: Generalized weakness    Cervical / Trunk Assessment Cervical / Trunk Assessment: Other exceptions;Kyphotic Cervical / Trunk Exceptions: s/p T9-L3 fusion  Communication   Communication: No difficulties  Cognition Arousal/Alertness: Awake/alert Behavior During Therapy: Flat affect Overall Cognitive Status: Impaired/Different from baseline Area of Impairment: Attention;Memory;Following commands;Safety/judgement;Awareness;Problem solving;Orientation                 Orientation  Level: Disoriented to;Situation Current Attention Level: Focused Memory: Decreased short-term memory;Decreased recall of precautions Following Commands: Follows one step commands with increased time;Follows one step commands inconsistently Safety/Judgement: Decreased awareness of safety;Decreased awareness of deficits Awareness:  Intellectual Problem Solving: Slow processing;Requires verbal cues;Difficulty sequencing General Comments: pain not as limiting today. seems to have memorized orientation questions - because functionally cannot retain back precaution education or safety awareness      General Comments General comments (skin integrity, edema, etc.): VSS    Exercises     Assessment/Plan    PT Assessment Patient needs continued PT services  PT Problem List Decreased strength;Decreased mobility;Decreased safety awareness;Decreased range of motion;Decreased activity tolerance;Decreased cognition;Pain;Decreased balance       PT Treatment Interventions Therapeutic activities;Gait training;Cognitive remediation;Therapeutic exercise;Patient/family education;Balance training;Functional mobility training;Wheelchair mobility training    PT Goals (Current goals can be found in the Care Plan section)  Acute Rehab PT Goals Patient Stated Goal: not stated PT Goal Formulation: Patient unable to participate in goal setting Time For Goal Achievement: 12/28/18 Potential to Achieve Goals: Fair    Frequency Min 3X/week   Barriers to discharge        Co-evaluation PT/OT/SLP Co-Evaluation/Treatment: Yes Reason for Co-Treatment: Complexity of the patient's impairments (multi-system involvement);Necessary to address cognition/behavior during functional activity;For patient/therapist safety;To address functional/ADL transfers PT goals addressed during session: Mobility/safety with mobility OT goals addressed during session: ADL's and self-care       AM-PAC PT "6 Clicks" Mobility  Outcome Measure Help needed turning from your back to your side while in a flat bed without using bedrails?: Total Help needed moving from lying on your back to sitting on the side of a flat bed without using bedrails?: Total Help needed moving to and from a bed to a chair (including a wheelchair)?: Total Help needed standing up from a  chair using your arms (e.g., wheelchair or bedside chair)?: Total Help needed to walk in hospital room?: Total Help needed climbing 3-5 steps with a railing? : Total 6 Click Score: 6    End of Session   Activity Tolerance: Patient tolerated treatment well Patient left: in chair;with call bell/phone within reach;with chair alarm set Nurse Communication: Mobility status PT Visit Diagnosis: Pain;Unsteadiness on feet (R26.81);Muscle weakness (generalized) (M62.81);Difficulty in walking, not elsewhere classified (R26.2)    Time: 4163-8453 PT Time Calculation (min) (ACUTE ONLY): 35 min   Charges:   PT Evaluation $PT Re-evaluation: 1 Re-eval          Ellamae Sia, PT, DPT Acute Rehabilitation Services Pager [phone] Office [phone]   Willy Eddy 12/14/2018, 4:31 PM

## 2018-12-14 NOTE — Progress Notes (Addendum)
PROGRESS NOTE    [name]  GEX:[id number] DOB: [birth year] DOA: 12/04/2018 PCP: [name], MD   Brief Narrative:  77 year old with history of osteoarthritis, degenerative disc disease, CHF, hypertension, hyperlipidemia, paroxysmal atrial fibrillation was brought to the hospital for generalized weakness but also lower backache.  She had extensive work-up done including CT of the head which was negative.  CT of the cervical spine which was negative.  X-ray of the left femur, right femur, pelvis which was all negative.  CT of the abdomen pelvis showed concerns for possible developing acute pancreatitis, large amount of stool in the colon with stercoral colitis.  MRI of the lumbar spine showed discitis/osteomyelitis with concerns of abscess versus old hematoma.  Neurosurgery team was consulted.  Tolerated her surgery well on 7/8, underwent total facetectomy, revision of all construct and new fusion of T9-L3.   Assessment & Plan:   Principal Problem:   Spinal stenosis Active Problems:   Vertebral osteomyelitis (HCC)   AKI (acute kidney injury) ([place])   UTI (urinary tract infection)  Thoracolumbar vertebral osteomyelitis with fluid collection concerning for abscess versus old hematoma status post surgical intervention 7/9 - Continue antibiotics per infectious disease team recommendations -Status post total facetectomy, revision of all construct and new fusion construct of T9-L3 per neurosurgery 7/8. -Plan to leave drain in place for at least 1-2 days -Infectious disease recommending oral doxycycline until seen outpatient by Dr. Prince Rome on 12/25/2018.Infectious disease has signed off -Advised nursing staff to remove Foley  Radicular/neuropathic pain in bilateral lower extremities secondary to severe spinal stenosis, seems to have improved after surgery. - On gabapentin 600 mg twice daily. - Dopplers negative for DVT -On Robaxin 500 mg twice daily.  Pseudomonas UTI, POA -  Contamination?.  Continue doxycycline per infectious disease team.  Essential hypertension -Continue Toprol-XL 100 mg daily.  Paroxysmal atrial fibrillation -On Toprol-XL.  Coumadin is currently on hold due to concerns for possibly going for surgery.  Will need to determine when will it be ok to resume.   Stercoral colitis -Secondary to large amount of stool in the rectum.  Aggressive bowel regimen.  No evidence of infection.  Physical therapy is recommending skilled nursing facility  DVT prophylaxis: SCDs, should be able to start subcu heparin tomorrow Code Status: Full code Family Communication: Spoke with patient's daughter over the phone, she is updated as well. Disposition Plan: Maintain hospital stay, she will need physical therapy and close monitoring.  Consultants:   Neurosurgery  Infectious disease-signed off  Procedures:   None  Antimicrobials:   Doxycycline   Subjective: Patient states she feels much better and surprisingly tolerated anesthesia well.  Overnight had some confusion but was able to rest.  Her mentation overall is at baseline.  Soft blood pressure this morning systolic in 01U therefore given slow 1 L bolus. Spoke with the patient's daughter came over the phone and she is updated as well.  Review of Systems Otherwise negative except as per HPI, including: General = no fevers, chills, dizziness, malaise, fatigue HEENT/EYES = negative for pain, redness, loss of vision, double vision, blurred vision, loss of hearing, sore throat, hoarseness, dysphagia Cardiovascular= negative for chest pain, palpitation, murmurs, lower extremity swelling Respiratory/lungs= negative for shortness of breath, cough, hemoptysis, wheezing, mucus production Gastrointestinal= negative for nausea, vomiting,, abdominal pain, melena, hematemesis Genitourinary= negative for Dysuria, Hematuria, Change in Urinary Frequency MSK = Negative for arthralgia, myalgias, Back Pain, Joint  swelling  Neurology= Negative for headache, seizures, numbness, tingling  Psychiatry= Negative  for anxiety, depression, suicidal and homocidal ideation Allergy/Immunology= Medication/Food allergy as listed  Skin= Negative for Rash, lesions, ulcers, itching     Objective: Vitals:   12/14/18 0000 12/14/18 0338 12/14/18 0750 12/14/18 0800  BP: (!) 121/55  (!) 99/47 (!) 104/45  Pulse: 64  70 67  Resp:   17 (!) 21  Temp:  98.3 F (36.8 C) 97.8 F (36.6 C)   TempSrc:  Oral Oral   SpO2: 100%  100% 100%    Intake/Output Summary (Last 24 hours) at 12/14/2018 1131 Last data filed at 12/14/2018 0945 Gross per 24 hour  Intake 2170 ml  Output 2125 ml  Net 45 ml   There were no vitals filed for this visit.  Examination:  Constitutional: NAD, calm, comfortable, Elderly frail Eyes: PERRL, lids and conjunctivae normal ENMT: Mucous membranes are moist. Posterior pharynx clear of any exudate or lesions.Normal dentition.  Neck: normal, supple, no masses, no thyromegaly Respiratory: clear to auscultation bilaterally, no wheezing, no crackles. Normal respiratory effort. No accessory muscle use.  Cardiovascular: Regular rate and rhythm, no murmurs / rubs / gallops. No extremity edema. 2+ pedal pulses. No carotid bruits.  Abdomen: no tenderness, no masses palpated. No hepatosplenomegaly. Bowel sounds positive.  Musculoskeletal: no clubbing / cyanosis. No joint deformity upper and lower extremities. Good ROM, no contractures. Normal muscle tone.  Skin: no rashes, lesions, ulcers. No induration Neurologic: All cranial nerves are intact, decreased sensation in strength in lower extremities but improved. Psychiatric: Poor judgment and insight. Alert and oriented x 2-more or less this is her baseline. Normal mood.    Data Reviewed:   CBC: Recent Labs  Lab 12/08/18 0537 12/13/18 0142 12/14/18 0153  WBC 5.1 4.9 7.9  NEUTROABS 4.1  --   --   HGB 10.3* 10.4* 9.5*  HCT 32.7* 32.7* 30.2*  MCV 86.1  86.1 85.6  PLT 151 190 329   Basic Metabolic Panel: Recent Labs  Lab 12/08/18 0537 12/13/18 0142 12/14/18 0153  NA 139 137 138  K 3.7 3.1* 4.0  CL 106 106 107  CO2 25 22 24   GLUCOSE 95 91 126*  BUN 14 18 15   CREATININE 0.81 0.74 0.78  CALCIUM 8.9 9.1 8.7*  MG  --  1.5* 1.8   GFR: CrCl cannot be calculated (Unknown ideal weight.). Liver Function Tests: Recent Labs  Lab 12/08/18 0537 12/13/18 0142 12/14/18 0153  AST 14* 12* 15  ALT 6 6 8   ALKPHOS 82 75 75  BILITOT 0.8 0.7 0.4  PROT 5.6* 5.1* 4.8*  ALBUMIN 2.8* 2.8* 2.5*   Recent Labs  Lab 12/08/18 0537  LIPASE 84*   No results for input(s): AMMONIA in the last 168 hours. Coagulation Profile: Recent Labs  Lab 12/10/18 0641 12/11/18 0810 12/12/18 0521 12/13/18 0142 12/14/18 0153  INR 1.5* 1.4* 1.3* 1.3* 1.2   Cardiac Enzymes: No results for input(s): CKTOTAL, CKMB, CKMBINDEX, TROPONINI in the last 168 hours. BNP (last 3 results) No results for input(s): PROBNP in the last 8760 hours. HbA1C: No results for input(s): HGBA1C in the last 72 hours. CBG: Recent Labs  Lab 12/11/18 0736 12/11/18 1147 12/11/18 1619 12/11/18 2141 12/12/18 0818  GLUCAP 102* 95 106* 87 91   Lipid Profile: No results for input(s): CHOL, HDL, LDLCALC, TRIG, CHOLHDL, LDLDIRECT in the last 72 hours. Thyroid Function Tests: No results for input(s): TSH, T4TOTAL, FREET4, T3FREE, THYROIDAB in the last 72 hours. Anemia Panel: No results for input(s): VITAMINB12, FOLATE, FERRITIN, TIBC, IRON, RETICCTPCT in the  last 72 hours. Sepsis Labs: No results for input(s): PROCALCITON, LATICACIDVEN in the last 168 hours.  Recent Results (from the past 240 hour(s))  Blood culture (routine x 2)     Status: None   Collection Time: 12/04/18  8:05 PM   Specimen: BLOOD  Result Value Ref Range Status   Specimen Description BLOOD LEFT ANTECUBITAL  Final   Special Requests   Final    BOTTLES DRAWN AEROBIC AND ANAEROBIC Blood Culture results may  not be optimal due to an excessive volume of blood received in culture bottles   Culture   Final    NO GROWTH 5 DAYS Performed at [facility], [place] [address], [place], [place] [address]    Report Status 12/09/2018 FINAL  Final  Blood culture (routine x 2)     Status: None   Collection Time: 12/04/18  8:07 PM   Specimen: BLOOD  Result Value Ref Range Status   Specimen Description BLOOD RIGHT ANTECUBITAL  Final   Special Requests   Final    BOTTLES DRAWN AEROBIC AND ANAEROBIC Blood Culture results may not be optimal due to an inadequate volume of blood received in culture bottles   Culture   Final    NO GROWTH 5 DAYS Performed at [facility], [place] [address], [place], [place] [address]    Report Status 12/09/2018 FINAL  Final  SARS Coronavirus 2 (CEPHEID - Performed in [facility] hospital lab), Hosp Order     Status: None   Collection Time: 12/05/18 12:30 AM   Specimen: Nasopharyngeal Swab  Result Value Ref Range Status   SARS Coronavirus 2 NEGATIVE NEGATIVE Final    Comment: (NOTE) If result is NEGATIVE SARS-CoV-2 target nucleic acids are NOT DETECTED. The SARS-CoV-2 RNA is generally detectable in upper and lower  respiratory specimens during the acute phase of infection. The lowest  concentration of SARS-CoV-2 viral copies this assay can detect is 250  copies / mL. A negative result does not preclude SARS-CoV-2 infection  and should not be used as the sole basis for treatment or other  patient management decisions.  A negative result may occur with  improper specimen collection / handling, submission of specimen other  than nasopharyngeal swab, presence of viral mutation(s) within the  areas targeted by this assay, and inadequate number of viral copies  (<250 copies / mL). A negative result must be combined with clinical  observations, patient history, and epidemiological information. If result is POSITIVE SARS-CoV-2 target nucleic acids are DETECTED. The  SARS-CoV-2 RNA is generally detectable in upper and lower  respiratory specimens dur ing the acute phase of infection.  Positive  results are indicative of active infection with SARS-CoV-2.  Clinical  correlation with patient history and other diagnostic information is  necessary to determine patient infection status.  Positive results do  not rule out bacterial infection or co-infection with other viruses. If result is PRESUMPTIVE POSTIVE SARS-CoV-2 nucleic acids MAY BE PRESENT.   A presumptive positive result was obtained on the submitted specimen  and confirmed on repeat testing.  While 2019 novel coronavirus  (SARS-CoV-2) nucleic acids may be present in the submitted sample  additional confirmatory testing may be necessary for epidemiological  and / or clinical management purposes  to differentiate between  SARS-CoV-2 and other Sarbecovirus currently known to infect humans.  If clinically indicated additional testing with an alternate test  methodology [phone]) is advised. The SARS-CoV-2 RNA is generally  detectable in upper and lower  respiratory sp ecimens during the acute  phase of infection. The expected result is Negative. Fact Sheet for Patients:  StrictlyIdeas.no Fact Sheet for Healthcare Providers: BankingDealers.co.za This test is not yet approved or cleared by the [place] FDA and has been authorized for detection and/or diagnosis of SARS-CoV-2 by FDA under an Emergency Use Authorization (EUA).  This EUA will remain in effect (meaning this test can be used) for the duration of the COVID-19 declaration under Section 564(b)(1) of the Act, 21 U.S.C. section 360bbb-3(b)(1), unless the authorization is terminated or revoked sooner. Performed at [facility], [place] [address] [place], [place] [address]   Culture, Urine     Status: Abnormal   Collection Time: 12/06/18  7:09 PM   Specimen: Urine, Catheterized  Result  Value Ref Range Status   Specimen Description URINE, CATHETERIZED  Final   Special Requests   Final    NONE Performed at [facility], [place] [address] [place], [place] [address]    Culture >=100,000 COLONIES/mL PSEUDOMONAS AERUGINOSA (A)  Final   Report Status 12/08/2018 FINAL  Final   Organism ID, Bacteria PSEUDOMONAS AERUGINOSA (A)  Final      Susceptibility   Pseudomonas aeruginosa - MIC*    CEFTAZIDIME 16 INTERMEDIATE Intermediate     CIPROFLOXACIN 1 SENSITIVE Sensitive     GENTAMICIN <=1 SENSITIVE Sensitive     IMIPENEM <=0.25 SENSITIVE Sensitive     PIP/TAZO 32 SENSITIVE Sensitive     CEFEPIME INTERMEDIATE Intermediate     * >=100,000 COLONIES/mL PSEUDOMONAS AERUGINOSA  MRSA PCR Screening     Status: None   Collection Time: 12/08/18 12:13 PM   Specimen: Nasal Mucosa; Nasopharyngeal  Result Value Ref Range Status   MRSA by PCR NEGATIVE NEGATIVE Final    Comment:        The GeneXpert MRSA Assay (FDA approved for NASAL specimens only), is one component of a comprehensive MRSA colonization surveillance program. It is not intended to diagnose MRSA infection nor to guide or monitor treatment for MRSA infections. Performed at [facility], [place] [address] [place], [place] [address]          Radiology Studies: Dg Thoracolumabar Spine  Result Date: 12/13/2018 CLINICAL DATA:  Portable imaging provided for thoracic spine surgery. EXAM: THORACOLUMBAR SPINE - 2 VIEW; DG C-ARM 61-120 MIN COMPARISON:  None. FLUOROSCOPY TIME:  Fluoroscopy Time:  48 seconds. Number of Acquired Spot Images: 2 FINDINGS: Two submitted images show pedicle screws interconnecting rods extending across the visualized thoracic spine. Posterior skin retractors are in place. IMPRESSION: Intraoperative imaging for thoracic spine surgery. Electronically Signed   By: Lajean Manes M.D.   On: 12/13/2018 19:31   Dg C-arm 1-60 Min  Result Date: 12/13/2018 CLINICAL DATA:  Portable imaging provided for  thoracic spine surgery. EXAM: THORACOLUMBAR SPINE - 2 VIEW; DG C-ARM 61-120 MIN COMPARISON:  None. FLUOROSCOPY TIME:  Fluoroscopy Time:  48 seconds. Number of Acquired Spot Images: 2 FINDINGS: Two submitted images show pedicle screws interconnecting rods extending across the visualized thoracic spine. Posterior skin retractors are in place. IMPRESSION: Intraoperative imaging for thoracic spine surgery. Electronically Signed   By: Lajean Manes M.D.   On: 12/13/2018 19:31        Scheduled Meds: . bisacodyl  10 mg Rectal Daily  . carbidopa-levodopa  2 tablet Oral TID  . doxycycline  100 mg Oral BID  . enoxaparin (LOVENOX) injection  40 mg Subcutaneous Q24H  . flecainide  50 mg  Oral BID  . gabapentin  600 mg Oral BID  . mouth rinse  15 mL Mouth Rinse BID  . methocarbamol  500 mg Oral BID  . metoprolol succinate  100 mg Oral Daily  . pantoprazole  40 mg Oral BID  . polyethylene glycol  17 g Oral Daily  . potassium chloride  40 mEq Oral Once  . pramipexole  1 mg Oral QHS  . pravastatin  40 mg Oral QHS   Continuous Infusions:    LOS: 9 days   Time spent= 25 mins    Ankit Arsenio Loader, MD [facility]  If 7PM-7AM, please contact night-coverage [email or web address] 12/14/2018, 11:31 AM

## 2018-12-14 NOTE — Progress Notes (Signed)
Neurosurgery Service Progress Note  Subjective: No acute events overnight. Per nursing, pt has not asked for pain medication so far today. Pt says her back is much better, no more anterior thigh dysesthetic pain   Objective: Vitals:   12/13/18 2015 12/13/18 2339 12/14/18 0000 12/14/18 0338  BP: 133/72  (!) 121/55   Pulse: 63  64   Resp: 17     Temp: (!) 97 F (36.1 C) 98.2 F (36.8 C)  98.3 F (36.8 C)  TempSrc:  Oral  Oral  SpO2: 98%  100%    Temp (24hrs), Avg:97.6 F (36.4 C), Min:97 F (36.1 C), Max:98.3 F (36.8 C)  CBC Latest Ref Rng & Units 12/14/2018 12/13/2018 12/08/2018  WBC 4.0 - 10.5 K/uL 7.9 4.9 5.1  Hemoglobin 12.0 - 15.0 g/dL 9.5(L) 10.4(L) 10.3(L)  Hematocrit 36.0 - 46.0 % 30.2(L) 32.7(L) 32.7(L)  Platelets 150 - 400 K/uL 198 190 151   BMP Latest Ref Rng & Units 12/14/2018 12/13/2018 12/08/2018  Glucose 70 - 99 mg/dL 126(H) 91 95  BUN 8 - 23 mg/dL 15 18 14   Creatinine 0.44 - 1.00 mg/dL 0.78 0.74 0.81  Sodium 135 - 145 mmol/L 138 137 139  Potassium 3.5 - 5.1 mmol/L 4.0 3.1(L) 3.7  Chloride 98 - 111 mmol/L 107 106 106  CO2 22 - 32 mmol/L 24 22 25   Calcium 8.9 - 10.3 mg/dL 8.7(L) 9.1 8.9    Intake/Output Summary (Last 24 hours) at 12/14/2018 0749 Last data filed at 12/14/2018 0000 Gross per 24 hour  Intake 2050 ml  Output 1765 ml  Net 285 ml    Current Facility-Administered Medications:  .  acetaminophen (TYLENOL) tablet 650 mg, 650 mg, Oral, Q6H PRN, 650 mg at 12/11/18 0827 **OR** acetaminophen (TYLENOL) suppository 650 mg, 650 mg, Rectal, Q6H PRN, Judith Part, MD .  alum & mag hydroxide-simeth (MAALOX/MYLANTA) 200-200-20 MG/5ML suspension 30 mL, 30 mL, Oral, Q4H PRN, Judith Part, MD .  bisacodyl (DULCOLAX) suppository 10 mg, 10 mg, Rectal, Daily, Azyriah Nevins, Joyice Faster, MD, 10 mg at 12/07/18 1154 .  carbidopa-levodopa (SINEMET IR) 25-100 MG per tablet immediate release 2 tablet, 2 tablet, Oral, TID, Judith Part, MD, 2 tablet at 12/14/18 [id number] .   doxycycline (VIBRA-TABS) tablet 100 mg, 100 mg, Oral, BID, Judith Part, MD, 100 mg at 12/13/18 0831 .  enoxaparin (LOVENOX) injection 40 mg, 40 mg, Subcutaneous, Q24H, Johnedward Brodrick, Joyice Faster, MD, 40 mg at 12/12/18 1600 .  flecainide (TAMBOCOR) tablet 50 mg, 50 mg, Oral, BID, Judith Part, MD, 50 mg at 12/13/18 0834 .  gabapentin (NEURONTIN) tablet 600 mg, 600 mg, Oral, BID, Judith Part, MD, 600 mg at 12/13/18 5053 .  guaiFENesin-dextromethorphan (ROBITUSSIN DM) 100-10 MG/5ML syrup 5 mL, 5 mL, Oral, Q4H PRN, Lorella Gomez A, MD .  hydrALAZINE (APRESOLINE) injection 10 mg, 10 mg, Intravenous, Q4H PRN, Judith Part, MD, 10 mg at 12/12/18 2000 .  HYDROcodone-acetaminophen (NORCO/VICODIN) 5-325 MG per tablet 1-2 tablet, 1-2 tablet, Oral, Q4H PRN, Judith Part, MD, 2 tablet at 12/13/18 9767 .  hydrocortisone (ANUSOL-HC) 2.5 % rectal cream 1 application, 1 application, Topical, QID PRN, Judith Part, MD .  hydrocortisone cream 1 % 1 application, 1 application, Topical, TID PRN, Judith Part, MD .  ipratropium-albuterol (DUONEB) 0.5-2.5 (3) MG/3ML nebulizer solution 3 mL, 3 mL, Nebulization, Q4H PRN, Tokiko Diefenderfer, Joyice Faster, MD .  lip balm (CARMEX) ointment 1 application, 1 application, Topical, PRN, Judith Part, MD .  liver  oil-zinc oxide (DESITIN) 40 % ointment, , Topical, Q2H PRN, Judith Part, MD, 1 application at 10/93/23 2221 .  loratadine (CLARITIN) tablet 10 mg, 10 mg, Oral, Daily PRN, Judith Part, MD .  MEDLINE mouth rinse, 15 mL, Mouth Rinse, BID, Tayler Lassen, Joyice Faster, MD, 15 mL at 12/13/18 0837 .  methocarbamol (ROBAXIN) tablet 500 mg, 500 mg, Oral, BID, Judith Part, MD, 500 mg at 12/13/18 5573 .  metoprolol succinate (TOPROL-XL) 24 hr tablet 100 mg, 100 mg, Oral, Daily, Judith Part, MD, 100 mg at 12/13/18 2202 .  Muscle Rub CREA 1 application, 1 application, Topical, PRN, Judas Mohammad A, MD .  pantoprazole  (PROTONIX) EC tablet 40 mg, 40 mg, Oral, BID, Judith Part, MD, 40 mg at 12/13/18 5427 .  phenol (CHLORASEPTIC) mouth spray 1 spray, 1 spray, Mouth/Throat, PRN, Makenzie Weisner A, MD .  polyethylene glycol (MIRALAX / GLYCOLAX) packet 17 g, 17 g, Oral, Daily, Judith Part, MD, 17 g at 12/12/18 0847 .  polyethylene glycol (MIRALAX / GLYCOLAX) packet 17 g, 17 g, Oral, Daily PRN, Jari Carollo A, MD .  polyvinyl alcohol (LIQUIFILM TEARS) 1.4 % ophthalmic solution 1 drop, 1 drop, Both Eyes, PRN, Amery Vandenbos A, MD .  potassium chloride SA (K-DUR) CR tablet 40 mEq, 40 mEq, Oral, Once, Tiaira Arambula A, MD .  pramipexole (MIRAPEX) tablet 1 mg, 1 mg, Oral, QHS, Cooper Moroney, Joyice Faster, MD, 1 mg at 12/12/18 2117 .  pravastatin (PRAVACHOL) tablet 40 mg, 40 mg, Oral, QHS, Kadelyn Dimascio, Joyice Faster, MD, 40 mg at 12/12/18 2118 .  senna-docusate (Senokot-S) tablet 2 tablet, 2 tablet, Oral, QHS PRN, Judith Part, MD .  sodium chloride (OCEAN) 0.65 % nasal spray 1 spray, 1 spray, Each Nare, PRN, Judith Part, MD   Physical Exam: Awake/alert, moderately confused, thinks she's at [place] 5/5 in BUE, 3/5 in BLE, pt unable to give a good sensory exam Incision c/d/i Drain w/ serosang output  Assessment & Plan: 78 y.o. 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 7/8 s/p total facetectomy & transpedic rsxn of calcified disc then revision of old construct and new fusion construct from T9-L3.  -back pain and dysesthetic pain appears significantly improved (pt states they're resolved) today, I think that we made the right decision -continue drain - likely 1-2 more days -PT/OT if able, activity as tolerated - no brace needed -okay for SQH POD2 (12/15/2018) -still early days post-op, but my hope is  that in a few weeks she can return to ambulation, will take some time for her BLE strength to improve -will update family this afternoon / tonight when able  Judith Part  12/14/18 7:49 AM

## 2018-12-15 ENCOUNTER — Telehealth: Payer: Medicare Other | Admitting: Neurology

## 2018-12-15 ENCOUNTER — Ambulatory Visit: Payer: Medicare Other | Admitting: Neurology

## 2018-12-15 DIAGNOSIS — I9589 Other hypotension: Secondary | ICD-10-CM

## 2018-12-15 DIAGNOSIS — E861 Hypovolemia: Secondary | ICD-10-CM

## 2018-12-15 LAB — COMPREHENSIVE METABOLIC PANEL
ALT: 7 U/L (ref 0–44)
AST: 15 U/L (ref 15–41)
Albumin: 2.4 g/dL — ABNORMAL LOW (ref 3.5–5.0)
Alkaline Phosphatase: 68 U/L (ref 38–126)
Anion gap: 9 (ref 5–15)
BUN: 26 mg/dL — ABNORMAL HIGH (ref 8–23)
CO2: 21 mmol/L — ABNORMAL LOW (ref 22–32)
Calcium: 8.5 mg/dL — ABNORMAL LOW (ref 8.9–10.3)
Chloride: 110 mmol/L (ref 98–111)
Creatinine, Ser: 0.83 mg/dL (ref 0.44–1.00)
GFR calc Af Amer: >60 mL/min (ref >60)
GFR calc non Af Amer: >60 mL/min (ref >60)
Glucose, Bld: 91 mg/dL (ref 70–99)
Potassium: 3.7 mmol/L (ref 3.5–5.1)
Sodium: 140 mmol/L (ref 135–145)
Total Bilirubin: 0.6 mg/dL (ref 0.3–1.2)
Total Protein: 4.3 g/dL — ABNORMAL LOW (ref 6.5–8.1)

## 2018-12-15 LAB — CBC
HCT: 25.4 % — ABNORMAL LOW (ref 36.0–46.0)
Hemoglobin: 8 g/dL — ABNORMAL LOW (ref 12.0–15.0)
MCH: 27.4 pg (ref 26.0–34.0)
MCHC: 31.5 g/dL (ref 30.0–36.0)
MCV: 87 fL (ref 80.0–100.0)
Platelets: 153 10*3/uL (ref 150–400)
RBC: 2.92 MIL/uL — ABNORMAL LOW (ref 3.87–5.11)
RDW: 16.1 % — ABNORMAL HIGH (ref 11.5–15.5)
WBC: 6 10*3/uL (ref 4.0–10.5)
nRBC: 0 % (ref 0.0–0.2)

## 2018-12-15 LAB — MAGNESIUM: Magnesium: 1.7 mg/dL (ref 1.7–2.4)

## 2018-12-15 LAB — PROTIME-INR
INR: 1.4 — ABNORMAL HIGH (ref 0.8–1.2)
Prothrombin Time: 17.1 seconds — ABNORMAL HIGH (ref 11.4–15.2)

## 2018-12-15 MED ORDER — SODIUM CHLORIDE 0.9 % IV BOLUS
500.0000 mL | Freq: Once | INTRAVENOUS | Status: AC
  Administered 2018-12-15: 500 mL via INTRAVENOUS

## 2018-12-15 MED ORDER — SODIUM CHLORIDE 0.9% FLUSH: 10.0000 mL | INTRAVENOUS | Status: DC | PRN

## 2018-12-15 MED ORDER — CARBIDOPA-LEVODOPA 25-100 MG PO TABS
2.0000 | ORAL_TABLET | Freq: Three times a day (TID) | ORAL | Status: DC
  Administered 2018-12-15 – 2018-12-19 (×12): 2 via ORAL
  Filled 2018-12-15 (×15): qty 2

## 2018-12-15 MED ORDER — ONDANSETRON HCL 4 MG/2ML IJ SOLN: 4.0000 mg | Freq: Four times a day (QID) | INTRAMUSCULAR | Status: DC | PRN

## 2018-12-15 MED ORDER — HEPARIN SODIUM (PORCINE) 5000 UNIT/ML IJ SOLN: 5000.0000 [IU] | Freq: Three times a day (TID) | INTRAMUSCULAR | Status: DC

## 2018-12-15 MED ORDER — SODIUM CHLORIDE 0.9 % IV SOLN
INTRAVENOUS | Status: AC
  Administered 2018-12-15 (×2): via INTRAVENOUS

## 2018-12-15 NOTE — Progress Notes (Signed)
PROGRESS NOTE    [name]  IRC:[id number] DOB: 05/08/1941 DOA: 12/04/2018 PCP: [name], MD   Brief Narrative:  78 year old with history of osteoarthritis, degenerative disc disease, CHF, hypertension, hyperlipidemia, paroxysmal atrial fibrillation was brought to the hospital for generalized weakness but also lower backache.  She had extensive work-up done including CT of the head which was negative.  CT of the cervical spine which was negative.  X-ray of the left femur, right femur, pelvis which was all negative.  CT of the abdomen pelvis showed concerns for possible developing acute pancreatitis, large amount of stool in the colon with stercoral colitis.  MRI of the lumbar spine showed discitis/osteomyelitis with concerns of abscess versus old hematoma.  Neurosurgery team was consulted.  Tolerated her surgery well on 7/8, underwent total facetectomy, revision of all construct and new fusion of T9-L3.   Assessment & Plan:   Principal Problem:   Spinal stenosis Active Problems:   Vertebral osteomyelitis (HCC)   AKI (acute kidney injury) ([place])   UTI (urinary tract infection)  Hypotension secondary to moderate dehydration -Likely from poor oral intake.  100 cc normal saline bolus ordered followed by normal saline 75 cc/h for next 24 hours.  Monitor urine output.  Thoracolumbar vertebral osteomyelitis with fluid collection concerning for abscess versus old hematoma status post surgical intervention 7/9 - Continue antibiotics per infectious disease team recommendations -Status post total facetectomy, revision of all construct and new fusion construct of T9-L3 per neurosurgery 7/8. -Maintain drain in place, managed by neurosurgery. -Infectious disease recommending oral doxycycline until seen outpatient by Dr. Prince Rome on 12/25/2018.Infectious disease has signed off -Advised nursing staff to remove Foley  Radicular/neuropathic pain in bilateral lower extremities secondary to  severe spinal stenosis, seems to have improved after surgery. - On gabapentin 600 mg twice daily. - Dopplers negative for DVT -On Robaxin 500 mg twice daily.  Pseudomonas UTI, POA - Contamination?.  Continue doxycycline per infectious disease team.  Essential hypertension -Continue Toprol-XL 100 mg daily.  Paroxysmal atrial fibrillation -On Toprol-XL.  Coumadin is currently on hold due to concerns for possibly going for surgery.  Will need to determine when will it be ok to resume.   Stercoral colitis -Aggressive bowel regimen.  Physical therapy is recommending skilled nursing facility  DVT prophylaxis: Lovenox SQ Code Status: Full code Family Communication: Left voicemail for Kim.  Disposition Plan: Maintain hospital stay for at least next 24-48 hours, eventual transition to SNF.  Consultants:   Neurosurgery  Infectious disease-signed off  Procedures:   None  Antimicrobials:   Doxycycline   Subjective: Low blood pressure this morning therefore IV fluids given.  She tells me she feels slightly nauseous likely secondary to pain meds and anesthesia.  Also tells me her mouth is dry.  Urine output decreased overnight.  Review of Systems Otherwise negative except as per HPI, including: General = no fevers, chills, dizziness, malaise, fatigue HEENT/EYES = negative for pain, redness, loss of vision, double vision, blurred vision, loss of hearing, sore throat, hoarseness, dysphagia Cardiovascular= negative for chest pain, palpitation, murmurs, lower extremity swelling Respiratory/lungs= negative for shortness of breath, cough, hemoptysis, wheezing, mucus production Gastrointestinal= negative for nausea, vomiting,, abdominal pain, melena, hematemesis Genitourinary= negative for Dysuria, Hematuria, Change in Urinary Frequency MSK = Negative for arthralgia, myalgias, Back Pain, Joint swelling  Neurology= Negative for headache, seizures, numbness, tingling  Psychiatry=  Negative for anxiety, depression, suicidal and homocidal ideation Allergy/Immunology= Medication/Food allergy as listed  Skin= Negative for Rash, lesions, ulcers, itching  Objective: Vitals:   12/14/18 2150 12/14/18 2315 12/15/18 0400 12/15/18 0741  BP: (!) 116/51 (!) 108/45 (!) 123/54 (!) 136/58  Pulse: 75 74  74  Resp:    16  Temp: 98.5 F (36.9 C) 97.8 F (36.6 C) 97.6 F (36.4 C) 98.2 F (36.8 C)  TempSrc: Oral Oral Oral Oral  SpO2: 94% 99% 98% 100%    Intake/Output Summary (Last 24 hours) at 12/15/2018 1006 Last data filed at 12/15/2018 0600 Gross per 24 hour  Intake 1480 ml  Output 430 ml  Net 1050 ml   There were no vitals filed for this visit.  Examination:  Constitutional: NAD, calm, comfortable, elderly frail. Eyes: PERRL, lids and conjunctivae normal ENMT: Mucous membranes are moist. Posterior pharynx clear of any exudate or lesions.Normal dentition.  Neck: normal, supple, no masses, no thyromegaly Respiratory: clear to auscultation bilaterally, no wheezing, no crackles. Normal respiratory effort. No accessory muscle use.  Cardiovascular: Regular rate and rhythm, no murmurs / rubs / gallops. No extremity edema. 2+ pedal pulses. No carotid bruits.  Abdomen: no tenderness, no masses palpated. No hepatosplenomegaly. Bowel sounds positive.  Musculoskeletal: no clubbing / cyanosis. No joint deformity upper and lower extremities. Good ROM, no contractures. Normal muscle tone.  Skin: no rashes, lesions, ulcers. No induration Neurologic: CN 2-12 grossly intact. Sensation intact, DTR normal. Strength 5/5 in all 4.  Psychiatric: Normal judgment and insight. Alert and oriented x 2-3, this is her baseline. Normal mood.     Data Reviewed:   CBC: Recent Labs  Lab 12/13/18 0142 12/14/18 0153 12/15/18 0546  WBC 4.9 7.9 6.0  HGB 10.4* 9.5* 8.0*  HCT 32.7* 30.2* 25.4*  MCV 86.1 85.6 87.0  PLT 190 198 169   Basic Metabolic Panel: Recent Labs  Lab 12/13/18 0142  12/14/18 0153 12/15/18 0546  NA 137 138 140  K 3.1* 4.0 3.7  CL 106 107 110  CO2 22 24 21*  GLUCOSE 91 126* 91  BUN 18 15 26*  CREATININE 0.74 0.78 0.83  CALCIUM 9.1 8.7* 8.5*  MG 1.5* 1.8 1.7   GFR: CrCl cannot be calculated (Unknown ideal weight.). Liver Function Tests: Recent Labs  Lab 12/13/18 0142 12/14/18 0153 12/15/18 0546  AST 12* 15 15  ALT 6 8 7   ALKPHOS 75 75 68  BILITOT 0.7 0.4 0.6  PROT 5.1* 4.8* 4.3*  ALBUMIN 2.8* 2.5* 2.4*   No results for input(s): LIPASE, AMYLASE in the last 168 hours. No results for input(s): AMMONIA in the last 168 hours. Coagulation Profile: Recent Labs  Lab 12/11/18 0810 12/12/18 0521 12/13/18 0142 12/14/18 0153 12/15/18 0546  INR 1.4* 1.3* 1.3* 1.2 1.4*   Cardiac Enzymes: No results for input(s): CKTOTAL, CKMB, CKMBINDEX, TROPONINI in the last 168 hours. BNP (last 3 results) No results for input(s): PROBNP in the last 8760 hours. HbA1C: No results for input(s): HGBA1C in the last 72 hours. CBG: Recent Labs  Lab 12/11/18 0736 12/11/18 1147 12/11/18 1619 12/11/18 2141 12/12/18 0818  GLUCAP 102* 95 106* 87 91   Lipid Profile: No results for input(s): CHOL, HDL, LDLCALC, TRIG, CHOLHDL, LDLDIRECT in the last 72 hours. Thyroid Function Tests: No results for input(s): TSH, T4TOTAL, FREET4, T3FREE, THYROIDAB in the last 72 hours. Anemia Panel: No results for input(s): VITAMINB12, FOLATE, FERRITIN, TIBC, IRON, RETICCTPCT in the last 72 hours. Sepsis Labs: No results for input(s): PROCALCITON, LATICACIDVEN in the last 168 hours.  Recent Results (from the past 240 hour(s))  Culture, Urine  Status: Abnormal   Collection Time: 12/06/18  7:09 PM   Specimen: Urine, Catheterized  Result Value Ref Range Status   Specimen Description URINE, CATHETERIZED  Final   Special Requests   Final    NONE Performed at [facility], [address] [place], [place] [address]    Culture >=100,000 COLONIES/mL PSEUDOMONAS  AERUGINOSA (A)  Final   Report Status 12/08/2018 FINAL  Final   Organism ID, Bacteria PSEUDOMONAS AERUGINOSA (A)  Final      Susceptibility   Pseudomonas aeruginosa - MIC*    CEFTAZIDIME 16 INTERMEDIATE Intermediate     CIPROFLOXACIN 1 SENSITIVE Sensitive     GENTAMICIN <=1 SENSITIVE Sensitive     IMIPENEM <=0.25 SENSITIVE Sensitive     PIP/TAZO 32 SENSITIVE Sensitive     CEFEPIME INTERMEDIATE Intermediate     * >=100,000 COLONIES/mL PSEUDOMONAS AERUGINOSA  MRSA PCR Screening     Status: None   Collection Time: 12/08/18 12:13 PM   Specimen: Nasal Mucosa; Nasopharyngeal  Result Value Ref Range Status   MRSA by PCR NEGATIVE NEGATIVE Final    Comment:        The GeneXpert MRSA Assay (FDA approved for NASAL specimens only), is one component of a comprehensive MRSA colonization surveillance program. It is not intended to diagnose MRSA infection nor to guide or monitor treatment for MRSA infections. Performed at [facility], [address], [place], [place] [address]          Radiology Studies: Dg Thoracolumabar Spine  Result Date: 12/13/2018 CLINICAL DATA:  Portable imaging provided for thoracic spine surgery. EXAM: THORACOLUMBAR SPINE - 2 VIEW; DG C-ARM 61-120 MIN COMPARISON:  None. FLUOROSCOPY TIME:  Fluoroscopy Time:  48 seconds. Number of Acquired Spot Images: 2 FINDINGS: Two submitted images show pedicle screws interconnecting rods extending across the visualized thoracic spine. Posterior skin retractors are in place. IMPRESSION: Intraoperative imaging for thoracic spine surgery. Electronically Signed   By: Lajean Manes M.D.   On: 12/13/2018 19:31   Dg C-arm 1-60 Min  Result Date: 12/13/2018 CLINICAL DATA:  Portable imaging provided for thoracic spine surgery. EXAM: THORACOLUMBAR SPINE - 2 VIEW; DG C-ARM 61-120 MIN COMPARISON:  None. FLUOROSCOPY TIME:  Fluoroscopy Time:  48 seconds. Number of Acquired Spot Images: 2 FINDINGS: Two submitted images show pedicle screws  interconnecting rods extending across the visualized thoracic spine. Posterior skin retractors are in place. IMPRESSION: Intraoperative imaging for thoracic spine surgery. Electronically Signed   By: Lajean Manes M.D.   On: 12/13/2018 19:31        Scheduled Meds: . bisacodyl  10 mg Rectal Daily  . carbidopa-levodopa  2 tablet Oral TID  . doxycycline  100 mg Oral BID  . enoxaparin (LOVENOX) injection  40 mg Subcutaneous Q24H  . flecainide  50 mg Oral BID  . gabapentin  600 mg Oral BID  . mouth rinse  15 mL Mouth Rinse BID  . methocarbamol  500 mg Oral BID  . metoprolol succinate  100 mg Oral Daily  . pantoprazole  40 mg Oral BID  . polyethylene glycol  17 g Oral Daily  . potassium chloride  40 mEq Oral Once  . pramipexole  1 mg Oral QHS  . pravastatin  40 mg Oral QHS   Continuous Infusions: . sodium chloride       LOS: 10 days   Time spent= 35 mins    Kynadi Dragos Arsenio Loader, MD [facility]  If 7PM-7AM, please contact night-coverage [email or web address]  12/15/2018, 10:06 AM

## 2018-12-15 NOTE — Consult Note (Signed)
   [facility] CM Inpatient Consult   12/15/2018  BRENDAN GRUWELL 1941/02/20 [id number]    Patient screened for potential needs of [facility] care management services under her [facility] plan, with a 28% high risk score for unplanned readmission and hospitalization. Noted 3 hospitalizations and 1 ED visit in the past 6 months.    Chart review and MD note on 12/14/18 show as follows: 78 year old with history of osteoarthritis, degenerative disc disease, CHF, hypertension, hyperlipidemia, paroxysmal atrial fibrillation was brought to the hospital for generalized weakness but also lower backache.  She had extensive work-up done including CT of the head which was negative.  CT of the abdomen pelvis showed concerns for possible developing acute pancreatitis, large amount of stool in the colon with stercoral colitis.  MRI of the lumbar spine showed discitis/osteomyelitis with concerns of abscess versus old hematoma.  Neurosurgery team was consulted.  Tolerated her surgery well on 7/8, underwent total facetectomy, revision of all construct and new fusion of T9-L3.  Primary care provider is Dr. [name] with [facility], listed to provide transition of care follow-up.   PT/ OT evaluation reviewed andis being recommended for skilled level therapies (SNF).  Pleaserefer to [facility] care managementif there are changes in current disposition needs forfollow-up as appropriate.  Of note, [facility] Care Management services does not replace or interfere with any services that are arranged by transition of care case management or social work.   For questions and additional information, please call:  Tupac Jeffus A. Orestes Geiman, BSN, RN-BC [facility] Liaison Cell: [phone]

## 2018-12-15 NOTE — Care Management Important Message (Signed)
Important Message  Patient Details  Name: [name] MRN: [id number] Date of Birth: [birth year]   Medicare Important Message Given:  Yes     Orbie Pyo 12/15/2018, 3:26 PM

## 2018-12-15 NOTE — Progress Notes (Addendum)
Physical Therapy Treatment Patient Details Name: [name] MRN: [id number] DOB: 1940/07/11 Today's Date: 12/15/2018    History of Present Illness 78 y.o. female with prior epidural abscess, now with continued back pain, admission workup notable for UTI and pancreatitis. MRI of lumbar spine done December 02, 2018 showing T12-L1 discitis-osteomyelitis with progressive disc space collapse and mildly progressive endplate erosion. Admitted with vertebral osteo, epidural abscess and spinal stenosis secondary to disc extrusion at T12. PMH including arthritis, DJD, CHF, HTN, hyperlipidemia, OSA, Parkinson's disease, and paroxysmal atrial fibrillation. 7/8, underwent total facetectomy, revision of all construct and new fusion of T9-L3.    PT Comments    Patient progressing slowly towards PT goals. Continues to demonstrate marked weakness in BLEs as well as cognitive deficits. Attempted to stand using stedy however pt unable despite assist of 2 and multimodal cues. Pt with difficulty understanding needing to pull up on stedy bar, instead pushing and resisting. Noted to have BM upon standing which pt had no awareness of. Reviewed back precautions. Able to recall need to log roll. Recommend nursing staff using maximove to transfer pt to chair. Will follow.    Follow Up Recommendations  SNF;Supervision/Assistance - 24 hour     Equipment Recommendations  None recommended by PT    Recommendations for Other Services       Precautions / Restrictions Precautions Precautions: Fall;Back Precaution Booklet Issued: No Precaution Comments: Reviewed back precautions, no brace needed Restrictions Weight Bearing Restrictions: No    Mobility  Bed Mobility Overal bed mobility: Needs Assistance Bed Mobility: Rolling;Sidelying to Sit;Sit to Sidelying Rolling: Mod assist Sidelying to sit: +2 for physical assistance;Max assist     Sit to sidelying: Max assist;+2 for physical assistance General bed  mobility comments: cues for sequencing with log roll, use of bed to assist. Able to initiate reaching for rail with cues. Rolling to right/left for pericare post session.  Transfers Overall transfer level: Needs assistance Equipment used: Ambulation equipment used Transfers: Sit to/from Stand Sit to Stand: +2 physical assistance;Max assist;Total assist         General transfer comment: Assist of 2 to attempt standing from EOB with stedy x2, only able to clear bottom minimally. Pt seems to be pushed against stedy bar instead of assisting with pulling up despite demonstration or verbal cues. Noticed to have BM, so returned to supine for pericare.  Ambulation/Gait             General Gait Details: Unable   Marine scientist Rankin (Stroke Patients Only)       Balance Overall balance assessment: Needs assistance Sitting-balance support: Bilateral upper extremity supported;Feet supported Sitting balance-Leahy Scale: Poor Sitting balance - Comments: BUE assist and therapist assist to maintain balance EOB Postural control: Posterior lean Standing balance support: Bilateral upper extremity supported;During functional activity Standing balance-Leahy Scale: Zero Standing balance comment: Not able to get upright despite total A of 2 in stedy                            Cognition Arousal/Alertness: Awake/alert Behavior During Therapy: Flat affect Overall Cognitive Status: Impaired/Different from baseline Area of Impairment: Attention;Memory;Following commands;Safety/judgement;Awareness                   Current Attention Level: Sustained Memory: Decreased short-term memory;Decreased recall of precautions Following Commands: Follows one step commands with  increased time;Follows one step commands inconsistently Safety/Judgement: Decreased awareness of safety;Decreased awareness of deficits Awareness: Intellectual Problem  Solving: Slow processing;Requires verbal cues;Difficulty sequencing General Comments: Pain not limiting today. Thinks her bday has passed even though she knows it is July 10th (bday [birth year]). Likely memorizing orientation questions. No awareness she is sitting in stool.      Exercises      General Comments General comments (skin integrity, edema, etc.): VSS. Noted to have redness along bottom and a few small open areas.      Pertinent Vitals/Pain Pain Assessment: Faces Faces Pain Scale: No hurt    Home Living                      Prior Function            PT Goals (current goals can now be found in the care plan section) Progress towards PT goals: Progressing toward goals(slowly)    Frequency    Min 3X/week      PT Plan Current plan remains appropriate    Co-evaluation              AM-PAC PT "6 Clicks" Mobility   Outcome Measure  Help needed turning from your back to your side while in a flat bed without using bedrails?: A Lot Help needed moving from lying on your back to sitting on the side of a flat bed without using bedrails?: Total Help needed moving to and from a bed to a chair (including a wheelchair)?: Total Help needed standing up from a chair using your arms (e.g., wheelchair or bedside chair)?: Total Help needed to walk in hospital room?: Total Help needed climbing 3-5 steps with a railing? : Total 6 Click Score: 7    End of Session Equipment Utilized During Treatment: Gait belt Activity Tolerance: Patient tolerated treatment well Patient left: in bed;with call bell/phone within reach;with bed alarm set;with SCD's reapplied Nurse Communication: Mobility status;Need for lift equipment PT Visit Diagnosis: Unsteadiness on feet (R26.81);Muscle weakness (generalized) (M62.81);Difficulty in walking, not elsewhere classified (R26.2)     Time: 1610-9604 PT Time Calculation (min) (ACUTE ONLY): 25 min  Charges:  $Therapeutic Activity: 23-37  mins                     Wray Kearns, [place], DPT Acute Rehabilitation Services Pager [phone] Office [phone]       [name] 12/15/2018, 10:15 AM

## 2018-12-15 NOTE — Progress Notes (Signed)
Neurosurgery Service Progress Note  Subjective: No acute events overnight. Continues to have no back pain or dysesthetic leg pain post-op, strength exam grossly stable  Objective: Vitals:   12/14/18 2150 12/14/18 2315 12/15/18 0400 12/15/18 0741  BP: (!) 116/51 (!) 108/45 (!) 123/54 (!) 136/58  Pulse: 75 74  74  Resp:    16  Temp: 98.5 F (36.9 C) 97.8 F (36.6 C) 97.6 F (36.4 C) 98.2 F (36.8 C)  TempSrc: Oral Oral Oral Oral  SpO2: 94% 99% 98% 100%   Temp (24hrs), Avg:97.9 F (36.6 C), Min:97.6 F (36.4 C), Max:98.5 F (36.9 C)  CBC Latest Ref Rng & Units 12/15/2018 12/14/2018 12/13/2018  WBC 4.0 - 10.5 K/uL 6.0 7.9 4.9  Hemoglobin 12.0 - 15.0 g/dL 8.0(L) 9.5(L) 10.4(L)  Hematocrit 36.0 - 46.0 % 25.4(L) 30.2(L) 32.7(L)  Platelets 150 - 400 K/uL 153 198 190   BMP Latest Ref Rng & Units 12/15/2018 12/14/2018 12/13/2018  Glucose 70 - 99 mg/dL 91 126(H) 91  BUN 8 - 23 mg/dL 26(H) 15 18  Creatinine 0.44 - 1.00 mg/dL 0.83 0.78 0.74  Sodium 135 - 145 mmol/L 140 138 137  Potassium 3.5 - 5.1 mmol/L 3.7 4.0 3.1(L)  Chloride 98 - 111 mmol/L 110 107 106  CO2 22 - 32 mmol/L 21(L) 24 22  Calcium 8.9 - 10.3 mg/dL 8.5(L) 8.7(L) 9.1    Intake/Output Summary (Last 24 hours) at 12/15/2018 0850 Last data filed at 12/15/2018 0600 Gross per 24 hour  Intake 1600 ml  Output 730 ml  Net 870 ml    Current Facility-Administered Medications:  .  sodium chloride 0.9 % bolus 500 mL, 500 mL, Intravenous, Once **FOLLOWED BY** 0.9 %  sodium chloride infusion, , Intravenous, Continuous, Amin, Ankit Chirag, MD .  acetaminophen (TYLENOL) tablet 650 mg, 650 mg, Oral, Q6H PRN, 650 mg at 12/11/18 0827 **OR** acetaminophen (TYLENOL) suppository 650 mg, 650 mg, Rectal, Q6H PRN, Judith Part, MD .  alum & mag hydroxide-simeth (MAALOX/MYLANTA) 200-200-20 MG/5ML suspension 30 mL, 30 mL, Oral, Q4H PRN, Judith Part, MD .  bisacodyl (DULCOLAX) suppository 10 mg, 10 mg, Rectal, Daily, Mareta Chesnut, Joyice Faster,  MD, 10 mg at 12/07/18 1154 .  carbidopa-levodopa (SINEMET IR) 25-100 MG per tablet immediate release 2 tablet, 2 tablet, Oral, TID, Amin, Ankit Chirag, MD .  doxycycline (VIBRA-TABS) tablet 100 mg, 100 mg, Oral, BID, Judith Part, MD, 100 mg at 12/14/18 2154 .  enoxaparin (LOVENOX) injection 40 mg, 40 mg, Subcutaneous, Q24H, Xylia Scherger, Joyice Faster, MD, 40 mg at 12/14/18 1553 .  flecainide (TAMBOCOR) tablet 50 mg, 50 mg, Oral, BID, Judith Part, MD, 50 mg at 12/14/18 2156 .  gabapentin (NEURONTIN) tablet 600 mg, 600 mg, Oral, BID, Judith Part, MD, 600 mg at 12/14/18 2155 .  guaiFENesin-dextromethorphan (ROBITUSSIN DM) 100-10 MG/5ML syrup 5 mL, 5 mL, Oral, Q4H PRN, Emersen Carroll A, MD .  hydrALAZINE (APRESOLINE) injection 10 mg, 10 mg, Intravenous, Q4H PRN, Judith Part, MD, 10 mg at 12/12/18 2000 .  HYDROcodone-acetaminophen (NORCO/VICODIN) 5-325 MG per tablet 1-2 tablet, 1-2 tablet, Oral, Q4H PRN, Judith Part, MD, 1 tablet at 12/14/18 2328 .  hydrocortisone (ANUSOL-HC) 2.5 % rectal cream 1 application, 1 application, Topical, QID PRN, Judith Part, MD .  hydrocortisone cream 1 % 1 application, 1 application, Topical, TID PRN, Judith Part, MD .  ipratropium-albuterol (DUONEB) 0.5-2.5 (3) MG/3ML nebulizer solution 3 mL, 3 mL, Nebulization, Q4H PRN, Judith Part, MD .  lip balm (CARMEX) ointment 1 application, 1 application, Topical, PRN, Othel Hoogendoorn, Joyice Faster, MD .  liver oil-zinc oxide (DESITIN) 40 % ointment, , Topical, Q2H PRN, Judith Part, MD, 1 application at 50/53/97 2221 .  loratadine (CLARITIN) tablet 10 mg, 10 mg, Oral, Daily PRN, Judith Part, MD .  MEDLINE mouth rinse, 15 mL, Mouth Rinse, BID, Keidra Withers, Joyice Faster, MD, 15 mL at 12/15/18 0038 .  methocarbamol (ROBAXIN) tablet 500 mg, 500 mg, Oral, BID, Judith Part, MD, 500 mg at 12/14/18 2154 .  metoprolol succinate (TOPROL-XL) 24 hr tablet 100 mg, 100 mg, Oral,  Daily, Judith Part, MD, 100 mg at 12/13/18 6734 .  Muscle Rub CREA 1 application, 1 application, Topical, PRN, Taro Hidrogo A, MD .  ondansetron (ZOFRAN) injection 4 mg, 4 mg, Intravenous, Q6H PRN, Amin, Ankit Chirag, MD .  pantoprazole (PROTONIX) EC tablet 40 mg, 40 mg, Oral, BID, Judith Part, MD, 40 mg at 12/14/18 2155 .  phenol (CHLORASEPTIC) mouth spray 1 spray, 1 spray, Mouth/Throat, PRN, Delona Clasby A, MD .  polyethylene glycol (MIRALAX / GLYCOLAX) packet 17 g, 17 g, Oral, Daily, Judith Part, MD, 17 g at 12/12/18 0847 .  polyethylene glycol (MIRALAX / GLYCOLAX) packet 17 g, 17 g, Oral, Daily PRN, Neyda Durango A, MD .  polyvinyl alcohol (LIQUIFILM TEARS) 1.4 % ophthalmic solution 1 drop, 1 drop, Both Eyes, PRN, Reade Trefz A, MD .  potassium chloride SA (K-DUR) CR tablet 40 mEq, 40 mEq, Oral, Once, Sharmarke Cicio A, MD .  pramipexole (MIRAPEX) tablet 1 mg, 1 mg, Oral, QHS, Marlea Gambill, Joyice Faster, MD, 1 mg at 12/14/18 2155 .  pravastatin (PRAVACHOL) tablet 40 mg, 40 mg, Oral, QHS, Ovie Cornelio, Joyice Faster, MD, 40 mg at 12/14/18 2154 .  senna-docusate (Senokot-S) tablet 2 tablet, 2 tablet, Oral, QHS PRN, Judith Part, MD .  sodium chloride (OCEAN) 0.65 % nasal spray 1 spray, 1 spray, Each Nare, PRN, Judith Part, MD   Physical Exam: Awake/alert, moderately confused, thinks she's at [place] 5/5 in BUE,  Strength: RLE: HF 3/5, KE 3/5, DF 3/5, EHL 3/5, PF 4-/5 LLE: HF 3/5, KE 3/5, DF 1/5, EHL 1/5, PF 3/5 SILTx4 Incision c/d/i Drain w/ serosang output  Assessment & Plan: 78 y.o. 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 7/8 s/p total facetectomy & transpedic rsxn of calcified disc then revision of old construct and new fusion construct from  T9-L3.  -pain continues to be minimal -continue drain, output 60 and 80 in last 2 shifts, d/c when <50cc per shift, likely tomorrow -activity as tolerated - no brace needed -okay for DVT chemoprophylaxis  -will update family today when able  Judith Part  12/15/18 8:50 AM

## 2018-12-16 LAB — CBC
HCT: 23.5 % — ABNORMAL LOW (ref 36.0–46.0)
Hemoglobin: 7.4 g/dL — ABNORMAL LOW (ref 12.0–15.0)
MCH: 27 pg (ref 26.0–34.0)
MCHC: 31.5 g/dL (ref 30.0–36.0)
MCV: 85.8 fL (ref 80.0–100.0)
Platelets: 144 10*3/uL — ABNORMAL LOW (ref 150–400)
RBC: 2.74 MIL/uL — ABNORMAL LOW (ref 3.87–5.11)
RDW: 15.9 % — ABNORMAL HIGH (ref 11.5–15.5)
WBC: 5.2 10*3/uL (ref 4.0–10.5)
nRBC: 0 % (ref 0.0–0.2)

## 2018-12-16 LAB — BASIC METABOLIC PANEL
Anion gap: 9 (ref 5–15)
BUN: 18 mg/dL (ref 8–23)
CO2: 21 mmol/L — ABNORMAL LOW (ref 22–32)
Calcium: 8.1 mg/dL — ABNORMAL LOW (ref 8.9–10.3)
Chloride: 105 mmol/L (ref 98–111)
Creatinine, Ser: 0.6 mg/dL (ref 0.44–1.00)
GFR calc Af Amer: >60 mL/min (ref >60)
GFR calc non Af Amer: >60 mL/min (ref >60)
Glucose, Bld: 89 mg/dL (ref 70–99)
Potassium: 3.6 mmol/L (ref 3.5–5.1)
Sodium: 135 mmol/L (ref 135–145)

## 2018-12-16 LAB — PROTIME-INR
INR: 1.3 — ABNORMAL HIGH (ref 0.8–1.2)
Prothrombin Time: 16.4 seconds — ABNORMAL HIGH (ref 11.4–15.2)

## 2018-12-16 LAB — MAGNESIUM: Magnesium: 1.4 mg/dL — ABNORMAL LOW (ref 1.7–2.4)

## 2018-12-16 MED ORDER — MAGNESIUM SULFATE 2 GM/50ML IV SOLN
2.0000 g | Freq: Once | INTRAVENOUS | Status: AC
  Administered 2018-12-16: 08:00:00 2 g via INTRAVENOUS
  Filled 2018-12-16: qty 50

## 2018-12-16 MED ORDER — POTASSIUM CHLORIDE 10 MEQ/100ML IV SOLN
10.0000 meq | INTRAVENOUS | Status: AC
  Administered 2018-12-16 (×4): 10 meq via INTRAVENOUS
  Filled 2018-12-16 (×4): qty 100

## 2018-12-16 NOTE — Progress Notes (Signed)
PROGRESS NOTE    [name]  EXN:[id number] DOB: 12/18/1940 DOA: 12/04/2018 PCP: [name], MD   Brief Narrative:  78 year old with history of osteoarthritis, degenerative disc disease, CHF, hypertension, hyperlipidemia, paroxysmal atrial fibrillation was brought to the hospital for generalized weakness but also lower backache.  She had extensive work-up done including CT of the head which was negative.  CT of the cervical spine which was negative.  X-ray of the left femur, right femur, pelvis which was all negative.  CT of the abdomen pelvis showed concerns for possible developing acute pancreatitis, large amount of stool in the colon with stercoral colitis.  MRI of the lumbar spine showed discitis/osteomyelitis with concerns of abscess versus old hematoma.  Neurosurgery team was consulted.  Tolerated her surgery well on 7/8, underwent total facetectomy, revision of all construct and new fusion of T9-L3.   Assessment & Plan:   Principal Problem:   Spinal stenosis Active Problems:   Vertebral osteomyelitis (HCC)   AKI (acute kidney injury) ([place])   UTI (urinary tract infection)  Hypotension secondary to moderate dehydration -This is improved with IV fluids.  Monitor urine output.  Thoracolumbar vertebral osteomyelitis with fluid collection concerning for abscess versus old hematoma status post surgical intervention 7/9 - Continue antibiotics, overall making good progress. -Status post total facetectomy, revision of all construct and new fusion construct of T9-L3 per neurosurgery 7/8. -Maintain drain in place, managed by neurosurgery. -Infectious disease recommending oral doxycycline until seen outpatient by Dr. Prince Rome on 12/25/2018.Infectious disease has signed off -Advised nursing staff to remove Foley  Radicular/neuropathic pain in bilateral lower extremities secondary to severe spinal stenosis, seems to have improved after surgery. - On gabapentin 600 mg twice daily. -  Dopplers negative for DVT -On Robaxin 500 mg twice daily.  Pseudomonas UTI, POA - Contamination?.  Continue doxycycline per infectious disease team.  Essential hypertension -Continue Toprol-XL 100 mg daily.  Paroxysmal atrial fibrillation -On Toprol-XL.  Coumadin is currently on hold due to concerns for possibly going for surgery.  Will need to determine when will it be ok to resume.   Stercoral colitis -Aggressive bowel regimen.  Continue working with physical therapy while here.  But she will need to go to skilled nursing facility.  DVT prophylaxis: Lovenox SQ Code Status: Full code Family Communication: Spoke with patient's daughter came over the phone this morning. Disposition Plan: Maintain hospital stay, transition to skilled nursing facility when bed available  Consultants:   Neurosurgery  Infectious disease-signed off  Procedures:   Total facetectomy and revision of construct with new fusion construct-7/8.  Antimicrobials:   Doxycycline   Subjective: Tells me she feels okay today, no complaints.  Review of Systems Otherwise negative except as per HPI, including: General = no fevers, chills, dizziness, malaise, fatigue HEENT/EYES = negative for pain, redness, loss of vision, double vision, blurred vision, loss of hearing, sore throat, hoarseness, dysphagia Cardiovascular= negative for chest pain, palpitation, murmurs, lower extremity swelling Respiratory/lungs= negative for shortness of breath, cough, hemoptysis, wheezing, mucus production Gastrointestinal= negative for nausea, vomiting,, abdominal pain, melena, hematemesis Genitourinary= negative for Dysuria, Hematuria, Change in Urinary Frequency MSK = Negative for arthralgia, myalgias, Back Pain, Joint swelling  Neurology= Negative for headache, seizures, numbness, tingling  Psychiatry= Negative for anxiety, depression, suicidal and homocidal ideation Allergy/Immunology= Medication/Food allergy as listed   Skin= Negative for Rash, lesions, ulcers, itching .    Objective: Vitals:   12/15/18 2046 12/16/18 0035 12/16/18 0544 12/16/18 0829  BP: (!) 130/98 (!) 117/49 Marland Kitchen)  122/52 (!) 117/49  Pulse: 71 70 68 76  Resp: 18 20 18    Temp: 97.7 F (36.5 C) (!) 97.5 F (36.4 C) 97.8 F (36.6 C) 98.2 F (36.8 C)  TempSrc: Oral Oral Oral Oral  SpO2: 100% 100% 98% 100%    Intake/Output Summary (Last 24 hours) at 12/16/2018 1129 Last data filed at 12/16/2018 0545 Gross per 24 hour  Intake 2051.63 ml  Output 270 ml  Net 1781.63 ml   There were no vitals filed for this visit.  Examination:  Constitutional: NAD, calm, comfortable Eyes: PERRL, lids and conjunctivae normal ENMT: Mucous membranes are moist. Posterior pharynx clear of any exudate or lesions.Normal dentition.  Neck: normal, supple, no masses, no thyromegaly Respiratory: clear to auscultation bilaterally, no wheezing, no crackles. Normal respiratory effort. No accessory muscle use.  Cardiovascular: Regular rate and rhythm, no murmurs / rubs / gallops. No extremity edema. 2+ pedal pulses. No carotid bruits.  Abdomen: no tenderness, no masses palpated. No hepatosplenomegaly. Bowel sounds positive.  Musculoskeletal: no clubbing / cyanosis. No joint deformity upper and lower extremities. Good ROM, no contractures. Normal muscle tone.  Skin: no rashes, lesions, ulcers. No induration Neurologic: CN 2-12 grossly intact. Sensation intact, DTR normal. Strength 5/5 in all 4.  Psychiatric: Normal judgment and insight. Alert and oriented x 3. Normal mood.  External female catheter in place  Data Reviewed:   CBC: Recent Labs  Lab 12/13/18 0142 12/14/18 0153 12/15/18 0546 12/16/18 0406  WBC 4.9 7.9 6.0 5.2  HGB 10.4* 9.5* 8.0* 7.4*  HCT 32.7* 30.2* 25.4* 23.5*  MCV 86.1 85.6 87.0 85.8  PLT 190 198 153 096*   Basic Metabolic Panel: Recent Labs  Lab 12/13/18 0142 12/14/18 0153 12/15/18 0546 12/16/18 0406  NA 137 138 140 135  K  3.1* 4.0 3.7 3.6  CL 106 107 110 105  CO2 22 24 21* 21*  GLUCOSE 91 126* 91 89  BUN 18 15 26* 18  CREATININE 0.74 0.78 0.83 0.60  CALCIUM 9.1 8.7* 8.5* 8.1*  MG 1.5* 1.8 1.7 1.4*   GFR: CrCl cannot be calculated (Unknown ideal weight.). Liver Function Tests: Recent Labs  Lab 12/13/18 0142 12/14/18 0153 12/15/18 0546  AST 12* 15 15  ALT 6 8 7   ALKPHOS 75 75 68  BILITOT 0.7 0.4 0.6  PROT 5.1* 4.8* 4.3*  ALBUMIN 2.8* 2.5* 2.4*   No results for input(s): LIPASE, AMYLASE in the last 168 hours. No results for input(s): AMMONIA in the last 168 hours. Coagulation Profile: Recent Labs  Lab 12/12/18 0521 12/13/18 0142 12/14/18 0153 12/15/18 0546 12/16/18 0406  INR 1.3* 1.3* 1.2 1.4* 1.3*   Cardiac Enzymes: No results for input(s): CKTOTAL, CKMB, CKMBINDEX, TROPONINI in the last 168 hours. BNP (last 3 results) No results for input(s): PROBNP in the last 8760 hours. HbA1C: No results for input(s): HGBA1C in the last 72 hours. CBG: Recent Labs  Lab 12/11/18 0736 12/11/18 1147 12/11/18 1619 12/11/18 2141 12/12/18 0818  GLUCAP 102* 95 106* 87 91   Lipid Profile: No results for input(s): CHOL, HDL, LDLCALC, TRIG, CHOLHDL, LDLDIRECT in the last 72 hours. Thyroid Function Tests: No results for input(s): TSH, T4TOTAL, FREET4, T3FREE, THYROIDAB in the last 72 hours. Anemia Panel: No results for input(s): VITAMINB12, FOLATE, FERRITIN, TIBC, IRON, RETICCTPCT in the last 72 hours. Sepsis Labs: No results for input(s): PROCALCITON, LATICACIDVEN in the last 168 hours.  Recent Results (from the past 240 hour(s))  Culture, Urine     Status: Abnormal  Collection Time: 12/06/18  7:09 PM   Specimen: Urine, Catheterized  Result Value Ref Range Status   Specimen Description URINE, CATHETERIZED  Final   Special Requests   Final    NONE Performed at [facility], [address], [place], [place] [address]    Culture >=100,000 COLONIES/mL PSEUDOMONAS AERUGINOSA (A)  Final    Report Status 12/08/2018 FINAL  Final   Organism ID, Bacteria PSEUDOMONAS AERUGINOSA (A)  Final      Susceptibility   Pseudomonas aeruginosa - MIC*    CEFTAZIDIME 16 INTERMEDIATE Intermediate     CIPROFLOXACIN 1 SENSITIVE Sensitive     GENTAMICIN <=1 SENSITIVE Sensitive     IMIPENEM <=0.25 SENSITIVE Sensitive     PIP/TAZO 32 SENSITIVE Sensitive     CEFEPIME INTERMEDIATE Intermediate     * >=100,000 COLONIES/mL PSEUDOMONAS AERUGINOSA  MRSA PCR Screening     Status: None   Collection Time: 12/08/18 12:13 PM   Specimen: Nasal Mucosa; Nasopharyngeal  Result Value Ref Range Status   MRSA by PCR NEGATIVE NEGATIVE Final    Comment:        The GeneXpert MRSA Assay (FDA approved for NASAL specimens only), is one component of a comprehensive MRSA colonization surveillance program. It is not intended to diagnose MRSA infection nor to guide or monitor treatment for MRSA infections. Performed at [facility], [place] [address] [place], [place] [address]          Radiology Studies: No results found.      Scheduled Meds: . bisacodyl  10 mg Rectal Daily  . carbidopa-levodopa  2 tablet Oral TID  . doxycycline  100 mg Oral BID  . enoxaparin (LOVENOX) injection  40 mg Subcutaneous Q24H  . flecainide  50 mg Oral BID  . gabapentin  600 mg Oral BID  . mouth rinse  15 mL Mouth Rinse BID  . methocarbamol  500 mg Oral BID  . metoprolol succinate  100 mg Oral Daily  . pantoprazole  40 mg Oral BID  . polyethylene glycol  17 g Oral Daily  . potassium chloride  40 mEq Oral Once  . pramipexole  1 mg Oral QHS  . pravastatin  40 mg Oral QHS   Continuous Infusions: . potassium chloride 10 mEq (12/16/18 1040)     LOS: 11 days   Time spent= 25 mins    Ankit Arsenio Loader, MD [facility]  If 7PM-7AM, please contact night-coverage [email or web address] 12/16/2018, 11:29 AM

## 2018-12-16 NOTE — Progress Notes (Signed)
Patient ID: [name], female   DOB: Mar 13, 1941, 78 y.o.   MRN: [id number] Deems to be stable.  Neurologic exam appears stable with some movement in the lower extremities though not strong.  She has mild back pain only.  Continue to mobilize.  Overall making the expected recovery.

## 2018-12-16 NOTE — Progress Notes (Signed)
CSW called and spoke with [facility] admission staff. They can accept the patient most likely Monday as they are awaiting insurance authorization.  Lamonte Richer, LCSW, [facility] [phone]

## 2018-12-16 NOTE — Progress Notes (Signed)
RN spoke with Pt's daughters 49. They were concerned that their mother is not moving around and being active enough. They asked if PT could come and work with the Pt today. I explained that PT does not come work with every Pt everyday. Pt's daughters continued to request of PT to come. I attempted to reach out to PT however they had already left for the day.

## 2018-12-17 LAB — BASIC METABOLIC PANEL
Anion gap: 6 (ref 5–15)
BUN: 11 mg/dL (ref 8–23)
CO2: 23 mmol/L (ref 22–32)
Calcium: 8.3 mg/dL — ABNORMAL LOW (ref 8.9–10.3)
Chloride: 108 mmol/L (ref 98–111)
Creatinine, Ser: 0.66 mg/dL (ref 0.44–1.00)
GFR calc Af Amer: >60 mL/min (ref >60)
GFR calc non Af Amer: >60 mL/min (ref >60)
Glucose, Bld: 90 mg/dL (ref 70–99)
Potassium: 4.6 mmol/L (ref 3.5–5.1)
Sodium: 137 mmol/L (ref 135–145)

## 2018-12-17 LAB — PROTIME-INR
INR: 1.2 (ref 0.8–1.2)
Prothrombin Time: 15.4 seconds — ABNORMAL HIGH (ref 11.4–15.2)

## 2018-12-17 LAB — CBC
HCT: 24.3 % — ABNORMAL LOW (ref 36.0–46.0)
Hemoglobin: 7.7 g/dL — ABNORMAL LOW (ref 12.0–15.0)
MCH: 26.9 pg (ref 26.0–34.0)
MCHC: 31.7 g/dL (ref 30.0–36.0)
MCV: 85 fL (ref 80.0–100.0)
Platelets: 151 10*3/uL (ref 150–400)
RBC: 2.86 MIL/uL — ABNORMAL LOW (ref 3.87–5.11)
RDW: 15.7 % — ABNORMAL HIGH (ref 11.5–15.5)
WBC: 4.7 10*3/uL (ref 4.0–10.5)
nRBC: 0 % (ref 0.0–0.2)

## 2018-12-17 LAB — MAGNESIUM: Magnesium: 1.6 mg/dL — ABNORMAL LOW (ref 1.7–2.4)

## 2018-12-17 LAB — SARS CORONAVIRUS 2 BY RT PCR (HOSPITAL ORDER, PERFORMED IN ~~LOC~~ HOSPITAL LAB): SARS Coronavirus 2: NEGATIVE

## 2018-12-17 MED ORDER — POTASSIUM CHLORIDE CRYS ER 20 MEQ PO TBCR
40.0000 meq | EXTENDED_RELEASE_TABLET | Freq: Once | ORAL | Status: DC
  Filled 2018-12-17: qty 2

## 2018-12-17 MED ORDER — MAGNESIUM SULFATE 2 GM/50ML IV SOLN
2.0000 g | Freq: Once | INTRAVENOUS | Status: AC
  Administered 2018-12-17: 2 g via INTRAVENOUS
  Filled 2018-12-17: qty 50

## 2018-12-17 NOTE — Progress Notes (Signed)
  NEUROSURGERY PROGRESS NOTE   No issues overnight.  Denies any pain  EXAM:  BP (!) 139/54 (BP Location: Right Arm)   Pulse 73   Temp (!) 97.4 F (36.3 C) (Oral)   Resp 18   SpO2 100%   Awake, alert, oriented  Speech fluent, appropriate  CN grossly intact  Stable LE strength  PLAN Stable neurologically Continue supportive care CSW working on SNF

## 2018-12-17 NOTE — Progress Notes (Signed)
PROGRESS NOTE    [name]  NOB:[id number] DOB: Oct 10, 1940 DOA: 12/04/2018 PCP: [name], MD   Brief Narrative:  78 year old with history of osteoarthritis, degenerative disc disease, CHF, hypertension, hyperlipidemia, paroxysmal atrial fibrillation was brought to the hospital for generalized weakness but also lower backache.  She had extensive work-up done including CT of the head which was negative.  CT of the cervical spine which was negative.  X-ray of the left femur, right femur, pelvis which was all negative.  CT of the abdomen pelvis showed concerns for possible developing acute pancreatitis, large amount of stool in the colon with stercoral colitis.  MRI of the lumbar spine showed discitis/osteomyelitis with concerns of abscess versus old hematoma.  Neurosurgery team was consulted.  Tolerated her surgery well on 7/8, underwent total facetectomy, revision of all construct and new fusion of T9-L3.   Assessment & Plan:   Principal Problem:   Spinal stenosis Active Problems:   Vertebral osteomyelitis (HCC)   AKI (acute kidney injury) ([place])   UTI (urinary tract infection)  Hypotension secondary to moderate dehydration -Stable now. PO intake adequate.   Thoracolumbar vertebral osteomyelitis with fluid collection concerning for abscess versus old hematoma status post surgical intervention 7/9 - Continue antibiotics, overall making good progress. -Status post total facetectomy, revision of all construct and new fusion construct of T9-L3 per neurosurgery 7/8. -Maintain drain in place, managed by neurosurgery. -Infectious disease recommending oral doxycycline until seen outpatient by Dr. Prince Rome on 12/25/2018.Infectious disease has signed off -Advised nursing staff to remove Foley  Radicular/neuropathic pain in bilateral lower extremities secondary to severe spinal stenosis, seems to have improved after surgery. - On gabapentin 600 mg twice daily. - Dopplers negative for  DVT -On Robaxin 500 mg twice daily.  Pseudomonas UTI, POA - Contamination?.  Continue doxycycline per infectious disease team.  Essential hypertension -Continue Toprol-XL 100 mg daily.  Paroxysmal atrial fibrillation -On Toprol-XL.  Coumadin is currently on hold due to concerns for possibly going for surgery.  Will need to determine when will it be ok to resume.   Stercoral colitis -Aggressive bowel regimen.  Continue working with physical therapy while here.  But she will need to go to skilled nursing facility.  DVT prophylaxis: Lovenox SQ Code Status: Full code Family Communication: None at bedside Disposition Plan: Awaiting placement at [place].   Consultants:   Neurosurgery  Infectious disease-signed off  Procedures:   Total facetectomy and revision of construct with new fusion construct-7/8.  Antimicrobials:   Doxycycline   Subjective: Doing ok, no complaints today.   Review of Systems Otherwise negative except as per HPI, including: General = no fevers, chills, dizziness, malaise, fatigue HEENT/EYES = negative for pain, redness, loss of vision, double vision, blurred vision, loss of hearing, sore throat, hoarseness, dysphagia Cardiovascular= negative for chest pain, palpitation, murmurs, lower extremity swelling Respiratory/lungs= negative for shortness of breath, cough, hemoptysis, wheezing, mucus production Gastrointestinal= negative for nausea, vomiting,, abdominal pain, melena, hematemesis Genitourinary= negative for Dysuria, Hematuria, Change in Urinary Frequency MSK = Negative for arthralgia, myalgias, Back Pain, Joint swelling  Neurology= Negative for headache, seizures, numbness, tingling  Psychiatry= Negative for anxiety, depression, suicidal and homocidal ideation Allergy/Immunology= Medication/Food allergy as listed  Skin= Negative for Rash, lesions, ulcers, itching   Objective: Vitals:   12/16/18 2036 12/17/18 0028 12/17/18 0447 12/17/18 0747   BP: (!) 116/45 (!) 118/56 (!) 139/54 (!) 129/47  Pulse: 74 73 73 71  Resp: 18 19 18    Temp: 97.6 F (36.4 C)  97.6 F (36.4 C) (!) 97.4 F (36.3 C) 97.8 F (36.6 C)  TempSrc: Oral Oral Oral Oral  SpO2: (!) 88% 97% 100% 100%    Intake/Output Summary (Last 24 hours) at 12/17/2018 0918 Last data filed at 12/17/2018 1610 Gross per 24 hour  Intake 745 ml  Output 650 ml  Net 95 ml   There were no vitals filed for this visit.  Examination:  Constitutional: NAD, calm, comfortable Eyes: PERRL, lids and conjunctivae normal ENMT: Mucous membranes are moist. Posterior pharynx clear of any exudate or lesions.Normal dentition.  Neck: normal, supple, no masses, no thyromegaly Respiratory: clear to auscultation bilaterally, no wheezing, no crackles. Normal respiratory effort. No accessory muscle use.  Cardiovascular: Regular rate and rhythm, no murmurs / rubs / gallops. No extremity edema. 2+ pedal pulses. No carotid bruits.  Abdomen: no tenderness, no masses palpated. No hepatosplenomegaly. Bowel sounds positive.  Musculoskeletal: no clubbing / cyanosis. No joint deformity upper and lower extremities. Good ROM, no contractures. Normal muscle tone.  Skin: no rashes, lesions, ulcers. No induration Neurologic: CN 2-12 grossly intact. Sensation intact, DTR normal. Strength 5/5 in all 4.  Psychiatric: Normal judgment and insight. Alert and oriented x 3. Normal mood.    External female catheter in place  Data Reviewed:   CBC: Recent Labs  Lab 12/13/18 0142 12/14/18 0153 12/15/18 0546 12/16/18 0406 12/17/18 0759  WBC 4.9 7.9 6.0 5.2 4.7  HGB 10.4* 9.5* 8.0* 7.4* 7.7*  HCT 32.7* 30.2* 25.4* 23.5* 24.3*  MCV 86.1 85.6 87.0 85.8 85.0  PLT 190 198 153 144* 960   Basic Metabolic Panel: Recent Labs  Lab 12/13/18 0142 12/14/18 0153 12/15/18 0546 12/16/18 0406 12/17/18 0759  NA 137 138 140 135 137  K 3.1* 4.0 3.7 3.6 4.6  CL 106 107 110 105 108  CO2 22 24 21* 21* 23  GLUCOSE 91 126*  91 89 90  BUN 18 15 26* 18 11  CREATININE 0.74 0.78 0.83 0.60 0.66  CALCIUM 9.1 8.7* 8.5* 8.1* 8.3*  MG 1.5* 1.8 1.7 1.4* 1.6*   GFR: CrCl cannot be calculated (Unknown ideal weight.). Liver Function Tests: Recent Labs  Lab 12/13/18 0142 12/14/18 0153 12/15/18 0546  AST 12* 15 15  ALT 6 8 7   ALKPHOS 75 75 68  BILITOT 0.7 0.4 0.6  PROT 5.1* 4.8* 4.3*  ALBUMIN 2.8* 2.5* 2.4*   No results for input(s): LIPASE, AMYLASE in the last 168 hours. No results for input(s): AMMONIA in the last 168 hours. Coagulation Profile: Recent Labs  Lab 12/13/18 0142 12/14/18 0153 12/15/18 0546 12/16/18 0406 12/17/18 0759  INR 1.3* 1.2 1.4* 1.3* 1.2   Cardiac Enzymes: No results for input(s): CKTOTAL, CKMB, CKMBINDEX, TROPONINI in the last 168 hours. BNP (last 3 results) No results for input(s): PROBNP in the last 8760 hours. HbA1C: No results for input(s): HGBA1C in the last 72 hours. CBG: Recent Labs  Lab 12/11/18 0736 12/11/18 1147 12/11/18 1619 12/11/18 2141 12/12/18 0818  GLUCAP 102* 95 106* 87 91   Lipid Profile: No results for input(s): CHOL, HDL, LDLCALC, TRIG, CHOLHDL, LDLDIRECT in the last 72 hours. Thyroid Function Tests: No results for input(s): TSH, T4TOTAL, FREET4, T3FREE, THYROIDAB in the last 72 hours. Anemia Panel: No results for input(s): VITAMINB12, FOLATE, FERRITIN, TIBC, IRON, RETICCTPCT in the last 72 hours. Sepsis Labs: No results for input(s): PROCALCITON, LATICACIDVEN in the last 168 hours.  Recent Results (from the past 240 hour(s))  MRSA PCR Screening     Status:  None   Collection Time: 12/08/18 12:13 PM   Specimen: Nasal Mucosa; Nasopharyngeal  Result Value Ref Range Status   MRSA by PCR NEGATIVE NEGATIVE Final    Comment:        The GeneXpert MRSA Assay (FDA approved for NASAL specimens only), is one component of a comprehensive MRSA colonization surveillance program. It is not intended to diagnose MRSA infection nor to guide or monitor  treatment for MRSA infections. Performed at [facility], [place] [address], [place], [place] [address]          Radiology Studies: No results found.      Scheduled Meds: . bisacodyl  10 mg Rectal Daily  . carbidopa-levodopa  2 tablet Oral TID  . doxycycline  100 mg Oral BID  . enoxaparin (LOVENOX) injection  40 mg Subcutaneous Q24H  . flecainide  50 mg Oral BID  . gabapentin  600 mg Oral BID  . mouth rinse  15 mL Mouth Rinse BID  . methocarbamol  500 mg Oral BID  . metoprolol succinate  100 mg Oral Daily  . pantoprazole  40 mg Oral BID  . polyethylene glycol  17 g Oral Daily  . potassium chloride  40 mEq Oral Once  . pramipexole  1 mg Oral QHS  . pravastatin  40 mg Oral QHS   Continuous Infusions: . magnesium sulfate bolus IVPB       LOS: 12 days   Time spent= 15 mins    Donnell Beauchamp Arsenio Loader, MD [facility]  If 7PM-7AM, please contact night-coverage [email or web address] 12/17/2018, 9:18 AM

## 2018-12-18 LAB — CBC
HCT: 24.5 % — ABNORMAL LOW (ref 36.0–46.0)
Hemoglobin: 7.9 g/dL — ABNORMAL LOW (ref 12.0–15.0)
MCH: 27.4 pg (ref 26.0–34.0)
MCHC: 32.2 g/dL (ref 30.0–36.0)
MCV: 85.1 fL (ref 80.0–100.0)
Platelets: 145 10*3/uL — ABNORMAL LOW (ref 150–400)
RBC: 2.88 MIL/uL — ABNORMAL LOW (ref 3.87–5.11)
RDW: 15.9 % — ABNORMAL HIGH (ref 11.5–15.5)
WBC: 5.2 10*3/uL (ref 4.0–10.5)
nRBC: 0 % (ref 0.0–0.2)

## 2018-12-18 LAB — BASIC METABOLIC PANEL
Anion gap: 7 (ref 5–15)
BUN: 11 mg/dL (ref 8–23)
CO2: 23 mmol/L (ref 22–32)
Calcium: 8 mg/dL — ABNORMAL LOW (ref 8.9–10.3)
Chloride: 106 mmol/L (ref 98–111)
Creatinine, Ser: 0.66 mg/dL (ref 0.44–1.00)
GFR calc Af Amer: >60 mL/min (ref >60)
GFR calc non Af Amer: >60 mL/min (ref >60)
Glucose, Bld: 91 mg/dL (ref 70–99)
Potassium: 3.9 mmol/L (ref 3.5–5.1)
Sodium: 136 mmol/L (ref 135–145)

## 2018-12-18 LAB — PROTIME-INR
INR: 1.2 (ref 0.8–1.2)
Prothrombin Time: 15 seconds (ref 11.4–15.2)

## 2018-12-18 LAB — MAGNESIUM: Magnesium: 1.7 mg/dL (ref 1.7–2.4)

## 2018-12-18 MED ORDER — HYDROCODONE-ACETAMINOPHEN 5-325 MG PO TABS: 1.0000 | ORAL_TABLET | Freq: Four times a day (QID) | ORAL | 0 refills | Status: AC | PRN

## 2018-12-18 MED ORDER — METHOCARBAMOL 500 MG PO TABS: 250.0000 mg | ORAL_TABLET | Freq: Two times a day (BID) | ORAL | 0 refills | Status: AC

## 2018-12-18 MED ORDER — TRAMADOL HCL 50 MG PO TABS: 50.0000 mg | ORAL_TABLET | Freq: Two times a day (BID) | ORAL | 0 refills | Status: AC | PRN

## 2018-12-18 MED ORDER — SENNOSIDES-DOCUSATE SODIUM 8.6-50 MG PO TABS: 2.0000 | ORAL_TABLET | Freq: Every evening | ORAL | 0 refills | Status: AC | PRN

## 2018-12-18 MED ORDER — FLECAINIDE ACETATE 50 MG PO TABS: 50.0000 mg | ORAL_TABLET | Freq: Two times a day (BID) | ORAL | 0 refills | Status: DC

## 2018-12-18 NOTE — TOC Progression Note (Signed)
Transition of Care [facility]) - Progression Note    Patient Details  Name: [name] MRN: [id number] Date of Birth: Jun 11, 1940  Transition of Care [facility]) CM/SW [place], [place] Phone Number: 12/18/2018, 1:20 PM  Clinical Narrative:     CSW spoke with the patient and the patient's spouse, [name], to  confirmed  SNF preference is [facility].  CSW contacted [facility] and was advised they will review clinicals and call CSW back.   CSW faxed clinicals for insurance authorization today. CSW awaiting insurance approval before discharge to SNF.  Thurmond Butts, MSW, [facility] Clinical Social Worker [phone]    Expected Discharge Plan: Skilled Nursing Facility(Or home health, no agreed upon disposition at this time.) Barriers to Discharge: Continued Medical Work up, Ship broker  Expected Discharge Plan and Services Expected Discharge Plan: Skilled Nursing Facility(Or home health, no agreed upon disposition at this time.)       Living arrangements for the past 2 months: Single Family Home Expected Discharge Date: 12/18/18                                     Social Determinants of Health (SDOH) Interventions    Readmission Risk Interventions Readmission Risk Prevention Plan 10/02/2018  Transportation Screening Complete  PCP or Specialist Appt within 5-7 Days Complete  Home Care Screening Complete  Medication Review (RN CM) Complete  Some recent data might be hidden

## 2018-12-18 NOTE — Discharge Instructions (Signed)
Discharge Instructions ° °No restriction in activities, slowly increase your activity back to normal.  ° °Your incision is closed with staples. We will remove these in clinic in 2 weeks at your follow up appointment.  ° °Okay to shower on the day of discharge. Use regular soap and water and try to be gentle when cleaning your incision. No need for a dressing on your incision. In the first few days after surgery, there may be some bloody drainage from your wound. If this happens, you can cover your incision with a gauze dressing to prevent it from staining your clothes or bed linens. If your incision begins to itch, rub some bacitracin or neosporin ointment on it instead of scratching it. ° °Follow up with Dr. Franciszek Platten in 2 weeks after discharge. If you do not already have a discharge appointment, please call his office at [phone] to schedule a follow up appointment. If you have any concerns or questions, please call the office and let us know. °

## 2018-12-18 NOTE — Progress Notes (Signed)
Received notification from [place] with [facility] that pt was active with them for HHRN/PT/OT prior to admission- alerted [facility] that pt would be transitioning to STSNF once auth received. Tiffany with [facility] will f/u at SNF for any further [facility] needs.

## 2018-12-18 NOTE — Progress Notes (Signed)
Physical Therapy Treatment Patient Details Name: [name] MRN: [id number] DOB: Jan 28, 1941 Today's Date: 12/18/2018    History of Present Illness 78 y.o. female with prior epidural abscess, now with continued back pain, admission workup notable for UTI and pancreatitis. MRI of lumbar spine done December 02, 2018 showing T12-L1 discitis-osteomyelitis with progressive disc space collapse and mildly progressive endplate erosion. Admitted with vertebral osteo, epidural abscess and spinal stenosis secondary to disc extrusion at T12. PMH including arthritis, DJD, CHF, HTN, hyperlipidemia, OSA, Parkinson's disease, and paroxysmal atrial fibrillation. 7/8, underwent total facetectomy, revision of all construct and new fusion of T9-L3.    PT Comments    Patient progressing slowly towards PT goals. Continues to demonstrate marked weakness in BLEs and trunk requiring Max A to maintain sitting balance. Total A using maximove to transfer to chair, which pt tolerated well. Worked on there ex of LEs. Reviewed back precautions as pt not able to recall. Pt A&Ox4 however she seems to have memorized orientation questions but is aware that she is supposed to d/c to SNF today. Will continue to follow.   Follow Up Recommendations  SNF;Supervision/Assistance - 24 hour     Equipment Recommendations  None recommended by PT    Recommendations for Other Services       Precautions / Restrictions Precautions Precautions: Fall;Back Precaution Booklet Issued: No Precaution Comments: Reviewed back precautions, no brace needed Restrictions Weight Bearing Restrictions: No    Mobility  Bed Mobility Overal bed mobility: Needs Assistance Bed Mobility: Rolling;Sidelying to Sit Rolling: Mod assist Sidelying to sit: Max assist;HOB elevated;+2 for physical assistance       General bed mobility comments: cues for sequencing with log roll, use of bed pad to assist. Able to initiate reaching for rail with cues but  needs assist wtih LEs and trunk to get to EOB.  Posterior lean.  Transfers Overall transfer level: Needs assistance Equipment used: Ambulation equipment used   Sit to Stand: Total assist         General transfer comment: donned lift pad sitting EOB with pt assisting with weight shifting to right/left; total A to transfer to chair with maximove- tolerated well.  Ambulation/Gait                 Stairs             Wheelchair Mobility    Modified Rankin (Stroke Patients Only)       Balance Overall balance assessment: Needs assistance Sitting-balance support: Bilateral upper extremity supported;Feet supported Sitting balance-Leahy Scale: Zero Sitting balance - Comments: BUE assist and therapist assist to maintain balance EOB; LOB posteriorly without Max A. Postural control: Posterior lean                                  Cognition Arousal/Alertness: Awake/alert Behavior During Therapy: Flat affect Overall Cognitive Status: Impaired/Different from baseline Area of Impairment: Attention;Memory;Following commands                   Current Attention Level: Sustained Memory: Decreased short-term memory;Decreased recall of precautions Following Commands: Follows one step commands with increased time     Problem Solving: Slow processing;Requires verbal cues General Comments: Pt aware she is being discharged today to SNF. Seems to have orientation questions memorized.      Exercises General Exercises - Lower Extremity Ankle Circles/Pumps: AAROM;Both;10 reps;Seated Quad Sets: Both;Supine;Strengthening;10 reps Long Arc Quad: AROM;Both;10 reps;Seated  General Comments General comments (skin integrity, edema, etc.): VSS.      Pertinent Vitals/Pain Pain Assessment: Faces Faces Pain Scale: No hurt    Home Living                      Prior Function            PT Goals (current goals can now be found in the care plan  section) Progress towards PT goals: Progressing toward goals    Frequency    Min 3X/week      PT Plan Current plan remains appropriate    Co-evaluation              AM-PAC PT "6 Clicks" Mobility   Outcome Measure  Help needed turning from your back to your side while in a flat bed without using bedrails?: A Lot Help needed moving from lying on your back to sitting on the side of a flat bed without using bedrails?: Total Help needed moving to and from a bed to a chair (including a wheelchair)?: Total Help needed standing up from a chair using your arms (e.g., wheelchair or bedside chair)?: Total Help needed to walk in hospital room?: Total Help needed climbing 3-5 steps with a railing? : Total 6 Click Score: 7    End of Session   Activity Tolerance: Patient tolerated treatment well Patient left: in bed;with call bell/phone within reach;with bed alarm set;with chair alarm set Nurse Communication: Mobility status;Need for lift equipment PT Visit Diagnosis: Unsteadiness on feet (R26.81);Muscle weakness (generalized) (M62.81);Difficulty in walking, not elsewhere classified (R26.2)     Time: 0932-3557 PT Time Calculation (min) (ACUTE ONLY): 19 min  Charges:  $Therapeutic Activity: 8-22 mins                     Wray Kearns, PT, DPT Acute Rehabilitation Services Pager [phone] Office [phone]       [place] 12/18/2018, 11:53 AM

## 2018-12-18 NOTE — Care Management Important Message (Signed)
Important Message  Patient Details  Name: [name] MRN: [id number] Date of Birth: August 30, 1940   Medicare Important Message Given:  Yes     [name] 12/18/2018, 3:41 PM

## 2018-12-18 NOTE — Progress Notes (Addendum)
Neurosurgery Service Progress Note  Subjective: No acute events overnight  Objective: Vitals:   12/17/18 1646 12/17/18 1924 12/18/18 0022 12/18/18 0340  BP: (!) 131/52 (!) 120/44 (!) 132/50   Pulse: 70  69   Resp:      Temp: 97.7 F (36.5 C) 98.2 F (36.8 C) 97.7 F (36.5 C) 98.1 F (36.7 C)  TempSrc: Oral Axillary Oral   SpO2: 99%  100%    Temp (24hrs), Avg:98.1 F (36.7 C), Min:97.7 F (36.5 C), Max:98.8 F (37.1 C)  CBC Latest Ref Rng & Units 12/17/2018 12/16/2018 12/15/2018  WBC 4.0 - 10.5 K/uL 4.7 5.2 6.0  Hemoglobin 12.0 - 15.0 g/dL 7.7(L) 7.4(L) 8.0(L)  Hematocrit 36.0 - 46.0 % 24.3(L) 23.5(L) 25.4(L)  Platelets 150 - 400 K/uL 151 144(L) 153   BMP Latest Ref Rng & Units 12/17/2018 12/16/2018 12/15/2018  Glucose 70 - 99 mg/dL 90 89 91  BUN 8 - 23 mg/dL 11 18 26(H)  Creatinine 0.44 - 1.00 mg/dL 0.66 0.60 0.83  Sodium 135 - 145 mmol/L 137 135 140  Potassium 3.5 - 5.1 mmol/L 4.6 3.6 3.7  Chloride 98 - 111 mmol/L 108 105 110  CO2 22 - 32 mmol/L 23 21(L) 21(L)  Calcium 8.9 - 10.3 mg/dL 8.3(L) 8.1(L) 8.5(L)    Intake/Output Summary (Last 24 hours) at 12/18/2018 0713 Last data filed at 12/18/2018 0600 Gross per 24 hour  Intake 360 ml  Output 1300 ml  Net -940 ml    Current Facility-Administered Medications:  .  acetaminophen (TYLENOL) tablet 650 mg, 650 mg, Oral, Q6H PRN, 650 mg at 12/11/18 0827 **OR** acetaminophen (TYLENOL) suppository 650 mg, 650 mg, Rectal, Q6H PRN, Judith Part, MD .  alum & mag hydroxide-simeth (MAALOX/MYLANTA) 200-200-20 MG/5ML suspension 30 mL, 30 mL, Oral, Q4H PRN, Judith Part, MD .  bisacodyl (DULCOLAX) suppository 10 mg, 10 mg, Rectal, Daily, Areta Terwilliger, Joyice Faster, MD, 10 mg at 12/07/18 1154 .  carbidopa-levodopa (SINEMET IR) 25-100 MG per tablet immediate release 2 tablet, 2 tablet, Oral, TID, Amin, Ankit Chirag, MD, 2 tablet at 12/17/18 1704 .  doxycycline (VIBRA-TABS) tablet 100 mg, 100 mg, Oral, BID, Judith Part, MD,  100 mg at 12/17/18 2140 .  enoxaparin (LOVENOX) injection 40 mg, 40 mg, Subcutaneous, Q24H, Dottie Vaquerano, Joyice Faster, MD, 40 mg at 12/17/18 1703 .  flecainide (TAMBOCOR) tablet 50 mg, 50 mg, Oral, BID, Judith Part, MD, 50 mg at 12/17/18 2140 .  gabapentin (NEURONTIN) tablet 600 mg, 600 mg, Oral, BID, Judith Part, MD, 600 mg at 12/17/18 2140 .  guaiFENesin-dextromethorphan (ROBITUSSIN DM) 100-10 MG/5ML syrup 5 mL, 5 mL, Oral, Q4H PRN, Lenee Franze A, MD .  hydrALAZINE (APRESOLINE) injection 10 mg, 10 mg, Intravenous, Q4H PRN, Judith Part, MD, 10 mg at 12/12/18 2000 .  HYDROcodone-acetaminophen (NORCO/VICODIN) 5-325 MG per tablet 1-2 tablet, 1-2 tablet, Oral, Q4H PRN, Judith Part, MD, 1 tablet at 12/16/18 0844 .  hydrocortisone (ANUSOL-HC) 2.5 % rectal cream 1 application, 1 application, Topical, QID PRN, Roy Snuffer A, MD .  hydrocortisone cream 1 % 1 application, 1 application, Topical, TID PRN, Clema Skousen A, MD .  ipratropium-albuterol (DUONEB) 0.5-2.5 (3) MG/3ML nebulizer solution 3 mL, 3 mL, Nebulization, Q4H PRN, Brodin Gelpi, Joyice Faster, MD .  lip balm (CARMEX) ointment 1 application, 1 application, Topical, PRN, Judith Part, MD .  liver oil-zinc oxide (DESITIN) 40 % ointment, , Topical, Q2H PRN, Judith Part, MD, 1 application at 92/01/00 2221 .  loratadine (  CLARITIN) tablet 10 mg, 10 mg, Oral, Daily PRN, Judith Part, MD .  MEDLINE mouth rinse, 15 mL, Mouth Rinse, BID, Angla Delahunt, Joyice Faster, MD, 15 mL at 12/17/18 2141 .  methocarbamol (ROBAXIN) tablet 500 mg, 500 mg, Oral, BID, Judith Part, MD, 500 mg at 12/17/18 2140 .  metoprolol succinate (TOPROL-XL) 24 hr tablet 100 mg, 100 mg, Oral, Daily, Indy Kuck A, MD, 100 mg at 12/17/18 1133 .  Muscle Rub CREA 1 application, 1 application, Topical, PRN, Amri Lien A, MD .  ondansetron (ZOFRAN) injection 4 mg, 4 mg, Intravenous, Q6H PRN, Amin, Ankit Chirag, MD .   pantoprazole (PROTONIX) EC tablet 40 mg, 40 mg, Oral, BID, Judith Part, MD, 40 mg at 12/17/18 2140 .  phenol (CHLORASEPTIC) mouth spray 1 spray, 1 spray, Mouth/Throat, PRN, Mayuri Staples A, MD .  polyethylene glycol (MIRALAX / GLYCOLAX) packet 17 g, 17 g, Oral, Daily, Elika Godar, Joyice Faster, MD, 17 g at 12/17/18 1134 .  polyethylene glycol (MIRALAX / GLYCOLAX) packet 17 g, 17 g, Oral, Daily PRN, Alvie Speltz A, MD .  polyvinyl alcohol (LIQUIFILM TEARS) 1.4 % ophthalmic solution 1 drop, 1 drop, Both Eyes, PRN, Nona Gracey A, MD .  potassium chloride SA (K-DUR) CR tablet 40 mEq, 40 mEq, Oral, Once, Amin, Ankit Chirag, MD .  pramipexole (MIRAPEX) tablet 1 mg, 1 mg, Oral, QHS, Girtie Wiersma, Joyice Faster, MD, 1 mg at 12/17/18 2142 .  pravastatin (PRAVACHOL) tablet 40 mg, 40 mg, Oral, QHS, Torie Towle, Joyice Faster, MD, 40 mg at 12/17/18 2140 .  senna-docusate (Senokot-S) tablet 2 tablet, 2 tablet, Oral, QHS PRN, Judith Part, MD .  sodium chloride (OCEAN) 0.65 % nasal spray 1 spray, 1 spray, Each Nare, PRN, Journiee Feldkamp A, MD .  sodium chloride flush (NS) 0.9 % injection 10-40 mL, 10-40 mL, Intracatheter, PRN, Judith Part, MD   Physical Exam: Awake/alert Strength 5/5 in BUE,  Strength: RLE: HF 3/5, KE 3/5, DF 3/5, EHL 3/5, PF 4-/5 LLE: HF 3/5, KE 3/5, DF 1/5, EHL 1/5, PF 3/5 SILTx4 Incision c/d/i  Assessment & Plan: 78 y.o. 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 7/8 s/p total facetectomy & transpedic rsxn of calcified disc then revision of old construct and new fusion construct from T9-L3.  -activity as tolerated - no brace needed -okay for DVT chemoprophylaxis  -wound care and follow up instructions placed in patient's discharge instructions in [place]   12/18/18 7:13 AM

## 2018-12-18 NOTE — Discharge Summary (Addendum)
Physician Discharge Summary  [name] WUJ:[id number] DOB: Oct 16, 1940 DOA: 12/04/2018  PCP: [name], MD  Admit date: 12/04/2018 Discharge date: 12/19/18  Admitted From: Home Disposition:  SNF  Recommendations for Outpatient Follow-up:  1. Follow up with PCP in 1-2 weeks 2. Please obtain BMP/CBC in one week your next doctors visit.  3. Pain medication scripts for Tramadol and Vicodin given with bowel regimen 4. Follow up with Dr Prince Rome from [facility] on 12/25/18. Take Oral doxycycline 100 mg twice daily until then released. 5. Follow-up outpatient cardiology, [name] next 3-4 weeks.  She gets flecainide from their service, for now I have given her 1 month prescription while in the rehab, further prescriptions needs to come from their service. 6. Coumadin is to be restarted on 12/21/2018 for atrial fibrillation. 7. Follow-up outpatient neurosurgery  Discharge Condition: Stable CODE STATUS: Full code Diet recommendation: Heart healthy  Brief/Interim Summary: 78 year old with history of osteoarthritis, degenerative disc disease, CHF, hypertension, hyperlipidemia, paroxysmal atrial fibrillation was brought to the hospital for generalized weakness but also lower backache.  She had extensive work-up done including CT of the head which was negative.  CT of the cervical spine which was negative.  X-ray of the left femur, right femur, pelvis which was all negative.  CT of the abdomen pelvis showed concerns for possible developing acute pancreatitis, large amount of stool in the colon with stercoral colitis.  MRI of the lumbar spine showed discitis/osteomyelitis with concerns of abscess versus old hematoma.  Neurosurgery team was consulted.  Tolerated her surgery well on 7/8, underwent total facetectomy, revision of all construct and new fusion of T9-L3.  Overall patient did postoperatively did very well.  It was determined to continue DVT prophylaxis postoperatively and resume her home Coumadin  1 week from the time of surgery.  Her Coumadin can be resumed on 12/21/2018.  She also needs to follow-up outpatient infectious disease Dr. Prince Rome on 12/25/2018.  In the meantime continue doxycycline 100 mg twice daily. At this time otherwise patient is medically stable for discharge with outpatient follow-up recommendations as stated above.  ADDENDUM 12/19/18 Doing well No acute events overnight. Stable for discharge.    Discharge Diagnoses:  Principal Problem:   Spinal stenosis Active Problems:   Vertebral osteomyelitis (HCC)   AKI (acute kidney injury) ([facility])   UTI (urinary tract infection)    Thoracolumbar vertebral osteomyelitis with fluid collection concerning for abscess versus old hematoma status post surgical intervention 7/9 - Continue antibiotics, overall making good progress. -Status post total facetectomy, revision of all construct and new fusion construct of T9-L3 per neurosurgery 7/8. -Drain is being managed by neurosurgery. -Infectious disease recommending oral doxycycline until seen outpatient by Dr. Prince Rome on 12/25/2018.Infectious disease has signed off -Foley catheter was removed postoperatively -Follow-up patient neurosurgery in about 2 weeks.  Radicular/neuropathic pain in bilateral lower extremities secondary to severe spinal stenosis, seems to have improved after surgery. - On gabapentin 600 mg twice daily. - Dopplers negative for DVT -On Robaxin 500 mg twice daily.  Hypotension secondary to moderate dehydration -Resolved  Pseudomonas UTI, POA - Contamination?.  Continue doxycycline per infectious disease team.  Essential hypertension -Continue Toprol-XL 100 mg daily.  Paroxysmal atrial fibrillation -On Toprol-XL.    For now on DVT prophylaxis, home regimen of Coumadin to be restarted on 12/21/2018.   Stercoral colitis -Aggressive bowel regimen.  Consultations:  Neurosurgery  Infectious disease  Subjective: Feels better, no complaints.  Doing  well overall.  Discharge Exam: Vitals:   12/18/18  0340 12/18/18 0730  BP:  133/60  Pulse:  64  Resp:  18  Temp: 98.1 F (36.7 C) 98.2 F (36.8 C)  SpO2:  100%   Vitals:   12/17/18 1924 12/18/18 0022 12/18/18 0340 12/18/18 0730  BP: (!) 120/44 (!) 132/50  133/60  Pulse:  69  64  Resp:    18  Temp: 98.2 F (36.8 C) 97.7 F (36.5 C) 98.1 F (36.7 C) 98.2 F (36.8 C)  TempSrc: Axillary Oral  Oral  SpO2:  100%  100%    General: Pt is alert, awake, not in acute distress Cardiovascular: RRR, S1/S2 +, no rubs, no gallops Respiratory: CTA bilaterally, no wheezing, no rhonchi Abdominal: Soft, NT, ND, bowel sounds + Extremities: no edema, no cyanosis  Discharge Instructions  Discharge Instructions    Call MD for:  severe uncontrolled pain   Complete by: As directed    Call MD for:  temperature >100.4   Complete by: As directed    Diet - low sodium heart healthy   Complete by: As directed    Discharge instructions   Complete by: As directed    You were cared for by a hospitalist during your hospital stay. If you have any questions about your discharge medications or the care you received while you were in the hospital after you are discharged, you can call the unit and asked to speak with the hospitalist on call if the hospitalist that took care of you is not available. Once you are discharged, your primary care physician will handle any further medical issues. Please note that NO REFILLS for any discharge medications will be authorized once you are discharged, as it is imperative that you return to your primary care physician (or establish a relationship with a primary care physician if you do not have one) for your aftercare needs so that they can reassess your need for medications and monitor your lab values.  Please request your Prim.MD to go over all Hospital Tests and Procedure/Radiological results at the follow up, please get all Hospital records sent to your Prim MD by  signing hospital release before you go home.  Get CBC, CMP, 2 view Chest X ray checked  by Primary MD during your next visit or SNF MD in 5-7 days ( we routinely change or add medications that can affect your baseline labs and fluid status, therefore we recommend that you get the mentioned basic workup next visit with your PCP, your PCP may decide not to get them or add new tests based on their clinical decision)  On your next visit with your primary care physician please Get Medicines reviewed and adjusted.  If you experience worsening of your admission symptoms, develop shortness of breath, life threatening emergency, suicidal or homicidal thoughts you must seek medical attention immediately by calling 911 or calling your MD immediately  if symptoms less severe.  You Must read complete instructions/literature along with all the possible adverse reactions/side effects for all the Medicines you take and that have been prescribed to you. Take any new Medicines after you have completely understood and accpet all the possible adverse reactions/side effects.   Do not drive, operate heavy machinery, perform activities at heights, swimming or participation in water activities or provide baby sitting services if your were admitted for syncope or siezures until you have seen by Primary MD or a Neurologist and advised to do so again.  Do not drive when taking Pain medications.   Increase activity slowly  Complete by: As directed      Allergies as of 12/18/2018      Reactions   Lyrica [pregabalin] Other (See Comments)   Felt loopy   Zocor [simvastatin] Other (See Comments)   Myalgias   Relafen [nabumetone] Rash      Medication List    STOP taking these medications   acetaminophen 325 MG tablet Commonly known as: TYLENOL     TAKE these medications   calcium carbonate 500 MG chewable tablet Commonly known as: TUMS - dosed in mg elemental calcium Chew 1 tablet by mouth 2 (two) times daily.    carbidopa-levodopa 25-100 MG tablet Commonly known as: SINEMET IR Take 2 tablets by mouth 3 (three) times daily. What changed: how much to take   doxycycline 100 MG tablet Commonly known as: VIBRA-TABS Take 1 tablet (100 mg total) by mouth 2 (two) times daily.   flecainide 50 MG tablet Commonly known as: TAMBOCOR Take 1 tablet (50 mg total) by mouth 2 (two) times daily.   furosemide 20 MG tablet Commonly known as: LASIX Take 40 mg by mouth daily.   gabapentin 600 MG tablet Commonly known as: NEURONTIN Take 600 mg by mouth 2 (two) times daily.   HM Vitamin D3 100 MCG (4000 UT) Caps Generic drug: Cholecalciferol Take 8,000 Units by mouth daily.   HYDROcodone-acetaminophen 5-325 MG tablet Commonly known as: NORCO/VICODIN Take 1-2 tablets by mouth every 6 (six) hours as needed for up to 3 days for moderate pain.   Melatonin 10 MG Tabs Take 10 mg by mouth at bedtime.   methocarbamol 500 MG tablet Commonly known as: ROBAXIN Take 0.5 tablets (250 mg total) by mouth 2 (two) times a day for 20 days.   metoprolol succinate 100 MG 24 hr tablet Commonly known as: TOPROL-XL TAKE 1 TABLET BY MOUTH DAILY   nystatin powder Commonly known as: MYCOSTATIN/NYSTOP Apply 1 g topically 3 (three) times daily.   pantoprazole 40 MG tablet Commonly known as: PROTONIX Take 40 mg by mouth 2 (two) times daily.   polyethylene glycol 17 g packet Commonly known as: MIRALAX / GLYCOLAX Take 17 g by mouth daily. What changed:   when to take this  reasons to take this   potassium chloride 10 MEQ tablet Commonly known as: K-DUR Take 40 mEq by mouth daily.   pramipexole 1 MG tablet Commonly known as: MIRAPEX Take 0.5 mg by mouth at bedtime.   pravastatin 40 MG tablet Commonly known as: PRAVACHOL Take 40 mg by mouth at bedtime.   senna-docusate 8.6-50 MG tablet Commonly known as: Senokot-S Take 2 tablets by mouth at bedtime as needed for mild constipation or moderate constipation.    traMADol 50 MG tablet Commonly known as: ULTRAM Take 1 tablet (50 mg total) by mouth every 12 (twelve) hours as needed for up to 3 days for moderate pain. What changed:   how much to take  when to take this  reasons to take this  additional instructions   Travatan Z 0.004 % Soln ophthalmic solution Generic drug: Travoprost (BAK Free) Place 1 drop into both eyes at bedtime.   warfarin 3 MG tablet Commonly known as: COUMADIN Take as directed. If you are unsure how to take this medication, talk to your nurse or doctor. Original instructions: Take 1.5 mg by mouth daily.      Contact information for after-discharge care    Destination    [facility] .   Service: Skilled Nursing Contact information:  [address] [place] [address] [phone]             Allergies  Allergen Reactions  . Lyrica [Pregabalin] Other (See Comments)    Felt loopy  . Zocor [Simvastatin] Other (See Comments)    Myalgias  . Relafen [Nabumetone] Rash    You were cared for by a hospitalist during your hospital stay. If you have any questions about your discharge medications or the care you received while you were in the hospital after you are discharged, you can call the unit and asked to speak with the hospitalist on call if the hospitalist that took care of you is not available. Once you are discharged, your primary care physician will handle any further medical issues. Please note that no refills for any discharge medications will be authorized once you are discharged, as it is imperative that you return to your primary care physician (or establish a relationship with a primary care physician if you do not have one) for your aftercare needs so that they can reassess your need for medications and monitor your lab values.   Procedures/Studies: Ct Abdomen Pelvis Wo Contrast  Result Date: 12/05/2018 CLINICAL DATA:  Decreased appetite in generalized  weakness. Back surgery 8 weeks ago. EXAM: CT ABDOMEN AND PELVIS WITHOUT CONTRAST TECHNIQUE: Multidetector CT imaging of the abdomen and pelvis was performed following the standard protocol without IV contrast. COMPARISON:  CT dated July 01, 2018 FINDINGS: Lower chest: There is atelectasis at the left lung base.The heart size is normal. Hepatobiliary: The liver is normal. Status post cholecystectomy.There is no biliary ductal dilation. Pancreas: There is mild haziness about the pancreas. There is no significant peripancreatic drainable fluid collection. Spleen: No splenic laceration or hematoma. Adrenals/Urinary Tract: --Adrenal glands: No adrenal hemorrhage. --Right kidney/ureter: The right kidney is unremarkable without evidence of hydronephrosis. --Left kidney/ureter: Again identified is a large cyst involving the lower pole the left kidney. There are either large peripelvic cysts versus is left-sided hydronephrosis with a UPJ obstruction. There does appear to be some delayed enhancement of the left kidney with respect to the right on the patient's prior CT from January 25th. --Urinary bladder: The urinary bladder is moderately distended. Stomach/Bowel: --Stomach/Duodenum: No hiatal hernia or other gastric abnormality. Normal duodenal course and caliber. --Small bowel: No dilatation or inflammation. --Colon: There is a large amount of stool in the rectum with some fat stranding and free fluid in the presacral space. There are scattered colonic diverticula without CT evidence of diverticulitis. --Appendix: Normal. Vascular/Lymphatic: Atherosclerotic calcification is present within the non-aneurysmal abdominal aorta, without hemodynamically significant stenosis. --No retroperitoneal lymphadenopathy. --No mesenteric lymphadenopathy. --No pelvic or inguinal lymphadenopathy. Reproductive: Status post hysterectomy. No adnexal mass. Other: No ascites or free air. The abdominal wall is normal. Musculoskeletal. Again  identified are findings of diskitis osteomyelitis at the T12-L1 level. There appears to be an extradural hyperdense collection measuring at least 1.2 by 0.8 by 3.6 cm extending from the inferior endplate of the E70 vertebral body superiorly to terminate near the inferior endplate of the J50 vertebral body. This appears similar to prior MRI given differences in modality and likely represents the large known disc extrusion. IMPRESSION: 1. Mild peripancreatic fat stranding concerning for developing pancreatitis. Correlation with lipase is recommended. 2. Large amount of stool in the rectum with presacral free fluid and rectal wall thickening is concerning for developing stercoral colitis. 3. Again identified are findings of T12-L1 discitis osteomyelitis. This is better visualized on the prior MRI.  4. Sigmoid diverticulosis without CT evidence of diverticulitis. Electronically Signed   By: Constance Holster M.D.   On: 12/05/2018 00:53   Dg Chest 2 View  Result Date: 12/04/2018 CLINICAL DATA:  Initial evaluation for acute chest pain. EXAM: CHEST - 2 VIEW COMPARISON:  Prior radiograph from 04/21/2016. FINDINGS: Transverse heart size at the upper limits of normal. Mediastinal silhouette within normal limits. Aortic atherosclerosis. Lungs normally inflated. No focal infiltrates. No pulmonary edema or pleural effusion. No pneumothorax. No acute osseous finding. IMPRESSION: 1. No active cardiopulmonary disease. 2. Aortic atherosclerosis. Electronically Signed   By: Jeannine Boga M.D.   On: 12/04/2018 21:27   Dg Thoracolumabar Spine  Result Date: 12/13/2018 CLINICAL DATA:  Portable imaging provided for thoracic spine surgery. EXAM: THORACOLUMBAR SPINE - 2 VIEW; DG C-ARM 61-120 MIN COMPARISON:  None. FLUOROSCOPY TIME:  Fluoroscopy Time:  48 seconds. Number of Acquired Spot Images: 2 FINDINGS: Two submitted images show pedicle screws interconnecting rods extending across the visualized thoracic spine. Posterior  skin retractors are in place. IMPRESSION: Intraoperative imaging for thoracic spine surgery. Electronically Signed   By: Lajean Manes M.D.   On: 12/13/2018 19:31   Dg Pelvis 1-2 Views  Result Date: 12/04/2018 CLINICAL DATA:  Initial evaluation for acute pain. EXAM: PELVIS - 1-2 VIEW COMPARISON:  None. FINDINGS: No acute fracture dislocation. No pubic diastasis. SI joints approximated. Femoral heads in normal alignment within the acetabula. Femoral head heights maintained. Fusion hardware partially visualize within the lumbar spine. Mild-to-moderate osteoarthritic changes about the hips bilaterally. Large amount of retained stool seen within the rectal vault. Soft tissues demonstrate no acute finding. IMPRESSION: 1. No acute osseous abnormality about the pelvis. 2. Large amount of retained stool within the rectal vault, suggesting constipation. 3. Mild to moderate degenerative osteoarthrosis about the hips bilaterally. Electronically Signed   By: Jeannine Boga M.D.   On: 12/04/2018 21:29   Ct Head Wo Contrast  Result Date: 12/04/2018 CLINICAL DATA:  Head trauma, altered mental status EXAM: CT HEAD WITHOUT CONTRAST CT CERVICAL SPINE WITHOUT CONTRAST TECHNIQUE: Multidetector CT imaging of the head and cervical spine was performed following the standard protocol without intravenous contrast. Multiplanar CT image reconstructions of the cervical spine were also generated. COMPARISON:  10/12/2013 FINDINGS: CT HEAD FINDINGS Brain: No evidence of acute infarction, hemorrhage, hydrocephalus, extra-axial collection or mass lesion/mass effect. Periventricular white matter hypodensity. Vascular: No hyperdense vessel or unexpected calcification. Skull: Normal. Negative for fracture or focal lesion. Sinuses/Orbits: No acute finding. Other: None. CT CERVICAL SPINE FINDINGS Alignment: Normal. Skull base and vertebrae: No acute fracture. No primary bone lesion or focal pathologic process. Soft tissues and spinal  canal: No prevertebral fluid or swelling. No visible canal hematoma. Disc levels: Moderate multilevel disc space height loss and osteophytosis. Upper chest: Negative. Other: None. IMPRESSION: 1. No acute intracranial pathology. Small-vessel white matter disease. 2.  No fracture or static subluxation of the cervical spine. Electronically Signed   By: Eddie Candle M.D.   On: 12/04/2018 20:56   Ct Cervical Spine Wo Contrast  Result Date: 12/04/2018 CLINICAL DATA:  Head trauma, altered mental status EXAM: CT HEAD WITHOUT CONTRAST CT CERVICAL SPINE WITHOUT CONTRAST TECHNIQUE: Multidetector CT imaging of the head and cervical spine was performed following the standard protocol without intravenous contrast. Multiplanar CT image reconstructions of the cervical spine were also generated. COMPARISON:  10/12/2013 FINDINGS: CT HEAD FINDINGS Brain: No evidence of acute infarction, hemorrhage, hydrocephalus, extra-axial collection or mass lesion/mass effect. Periventricular white matter hypodensity. Vascular: No  hyperdense vessel or unexpected calcification. Skull: Normal. Negative for fracture or focal lesion. Sinuses/Orbits: No acute finding. Other: None. CT CERVICAL SPINE FINDINGS Alignment: Normal. Skull base and vertebrae: No acute fracture. No primary bone lesion or focal pathologic process. Soft tissues and spinal canal: No prevertebral fluid or swelling. No visible canal hematoma. Disc levels: Moderate multilevel disc space height loss and osteophytosis. Upper chest: Negative. Other: None. IMPRESSION: 1. No acute intracranial pathology. Small-vessel white matter disease. 2.  No fracture or static subluxation of the cervical spine. Electronically Signed   By: Eddie Candle M.D.   On: 12/04/2018 20:56   Mr Lumbar Spine Wo Contrast  Result Date: 12/02/2018 CLINICAL DATA:  Lumbar spondylosis. History of lumbar surgery, discitis-osteomyelitis, and epidural abscess. EXAM: MRI LUMBAR SPINE WITHOUT CONTRAST TECHNIQUE:  Multiplanar, multisequence MR imaging of the lumbar spine was performed. No intravenous contrast was administered. COMPARISON:  Thoracic and lumbar spine MRI 10/17/2018 FINDINGS: Segmentation:  Standard. Alignment: Mild lumbar levoscoliosis. Minimal fused anterolisthesis of L5 on S1 and trace retrolisthesis of L2 on L3. Vertebrae: L1-S1 pedicle screw and rod fusion with interbody implants at L4-5 and L5-S1. Progressive severe T12-L1 disc space collapse with persistent though decreased fluid in the residual T12-L1 disc space and mildly progressive erosive changes involving the L1 superior greater than T12 inferior endplates. Similar degree of edema in the T12 and L1 vertebrae. 21 x 6 mm dorsal epidural fluid collection at T12-L1 which is hyperintense on T1, T2, and STIR sequences, only imaged sagittally. There is diffuse epidural enhancement in this location on the prior study which extended superiorly in the thoracic spine. Ventral epidural phlegmon/abscess in the lower thoracic spine on the prior study appears largely resolved although today's study does not include dedicated thoracic spine imaging, axial imaging through these levels, or contrast. Conus medullaris and cauda equina: Conus extends to the L1 level and demonstrates normal signal. Unremarkable cauda equina. Paraspinal and other soft tissues: Similar appearance of postoperative fluid collection in the laminectomy bed at L4-5. Persistent mild bilateral paraspinal soft tissue edema near the thoracolumbar junction. Incompletely evaluated cystic renal lesions. Disc levels: T12-L1: Only imaged sagittally. Discitis-osteomyelitis as described above large volume intermediate T1 and low T2 signal epidural material in the right lateral and ventral epidural space behind the T12 vertebral body contiguous with both the T12-L1 and T11-12 disc spaces with appearance most suggestive of a large disc extrusion. Together with the above described dorsal epidural fluid  collection, this results in worsening severe spinal stenosis with compression of the conus. Disc bulging and facet hypertrophy result in persistent moderate right and severe left neural foraminal stenosis. L1-2 through L5-S1. Previous posterior decompression and fusion without significant residual stenosis. IMPRESSION: 1. T12-L1 discitis-osteomyelitis with progressive disc space collapse and mildly progressive endplate erosion. Ventral epidural phlegmon/abscess in the lower thoracic spine appears to have largely resolved though was incompletely evaluated on this lumbar MRI. 2. Increased size of a large right-sided disc extrusion behind the T12 vertebral body with increased severe spinal stenosis. A 21 x 6 mm dorsal epidural fluid collection at T12-L1 contributes and may represent abscess or old hematoma. Electronically Signed   By: Logan Bores M.D.   On: 12/02/2018 16:32   Dg C-arm 1-60 Min  Result Date: 12/13/2018 CLINICAL DATA:  Portable imaging provided for thoracic spine surgery. EXAM: THORACOLUMBAR SPINE - 2 VIEW; DG C-ARM 61-120 MIN COMPARISON:  None. FLUOROSCOPY TIME:  Fluoroscopy Time:  48 seconds. Number of Acquired Spot Images: 2 FINDINGS: Two submitted images show  pedicle screws interconnecting rods extending across the visualized thoracic spine. Posterior skin retractors are in place. IMPRESSION: Intraoperative imaging for thoracic spine surgery. Electronically Signed   By: Lajean Manes M.D.   On: 12/13/2018 19:31   Dg Femur Min 2 Views Left  Result Date: 12/04/2018 CLINICAL DATA:  Initial evaluation for acute pain. EXAM: LEFT FEMUR 2 VIEWS COMPARISON:  None. FINDINGS: No acute fracture or dislocation. Osseous mineralization within normal limits. Mild osteoarthritic changes noted about the left hip and knee. Visualized bony pelvis intact. No acute soft tissue abnormality. IMPRESSION: No acute osseous abnormality about the left femur. Electronically Signed   By: Jeannine Boga M.D.   On:  12/04/2018 21:18   Dg Femur Min 2 Views Right  Result Date: 12/04/2018 CLINICAL DATA:  Initial evaluation for acute pain. EXAM: RIGHT FEMUR 2 VIEWS COMPARISON:  None. FINDINGS: No acute fracture or dislocation. Visualized bony pelvis intact. Moderate osteoarthritic changes noted about the right hip and knee. Osseous mineralization normal. No acute soft tissue abnormality. IMPRESSION: 1. No acute osseous abnormality about the right femur. 2. Moderate degenerative osteoarthrosis about the right hip and knee. Electronically Signed   By: Jeannine Boga M.D.   On: 12/04/2018 21:23   Vas [place] Lower Extremity Venous (dvt)  Result Date: 12/09/2018  Lower Venous Study Indications: Stroke.  Risk Factors: Paroxysmal A-Fib, HTN, HLD, CHF. Performing Technologist: Toma Copier RVS  Examination Guidelines: A complete evaluation includes B-mode imaging, spectral Doppler, color Doppler, and power Doppler as needed of all accessible portions of each vessel. Bilateral testing is considered an integral part of a complete examination. Limited examinations for reoccurring indications may be performed as noted.  +---------+---------------+---------+-----------+----------+-------+ RIGHT    CompressibilityPhasicitySpontaneityPropertiesSummary +---------+---------------+---------+-----------+----------+-------+ CFV      Full           Yes      Yes                          +---------+---------------+---------+-----------+----------+-------+ SFJ      Full                                                 +---------+---------------+---------+-----------+----------+-------+ FV Prox  Full           Yes      Yes                          +---------+---------------+---------+-----------+----------+-------+ FV Mid   Full                                                 +---------+---------------+---------+-----------+----------+-------+ FV DistalFull           Yes      Yes                           +---------+---------------+---------+-----------+----------+-------+ PFV      Full           Yes      Yes                          +---------+---------------+---------+-----------+----------+-------+ POP  Full           Yes      Yes                          +---------+---------------+---------+-----------+----------+-------+ PTV      Full                                                 +---------+---------------+---------+-----------+----------+-------+ PERO     Full                                                 +---------+---------------+---------+-----------+----------+-------+   Right Technical Findings: Not visualized segments include Peroneal.  +---------+---------------+---------+-----------+----------+------------------+ LEFT     CompressibilityPhasicitySpontaneityPropertiesSummary            +---------+---------------+---------+-----------+----------+------------------+ CFV      Full           Yes      Yes                                     +---------+---------------+---------+-----------+----------+------------------+ SFJ      Full                                                            +---------+---------------+---------+-----------+----------+------------------+ FV Prox  Full           Yes      Yes                                     +---------+---------------+---------+-----------+----------+------------------+ FV Mid   Full                                                            +---------+---------------+---------+-----------+----------+------------------+ FV DistalFull           Yes      Yes                                     +---------+---------------+---------+-----------+----------+------------------+ PFV      Full           Yes      Yes                                     +---------+---------------+---------+-----------+----------+------------------+ POP      Full           Yes      Yes                                      +---------+---------------+---------+-----------+----------+------------------+  PTV      Full                                                            +---------+---------------+---------+-----------+----------+------------------+ PERO                                                  Difficult to image +---------+---------------+---------+-----------+----------+------------------+   Left Technical Findings: Not visualized segments include Unable to image all segments of the peroneal.   Summary: Right: There is no evidence of deep vein thrombosis in the lower extremity. No cystic structure found in the popliteal fossa. Left: There is no evidence of deep vein thrombosis in the lower extremity. However, portions of this examination were limited- see technologist comments above. No cystic structure found in the popliteal fossa. See technical findings listed above  *See table(s) above for measurements and observations. Electronically signed by Servando Snare MD on 12/09/2018 at 12:36:13 PM.    Final       The results of significant diagnostics from this hospitalization (including imaging, microbiology, ancillary and laboratory) are listed below for reference.     Microbiology: Recent Results (from the past 240 hour(s))  MRSA PCR Screening     Status: None   Collection Time: 12/08/18 12:13 PM   Specimen: Nasal Mucosa; Nasopharyngeal  Result Value Ref Range Status   MRSA by PCR NEGATIVE NEGATIVE Final    Comment:        The GeneXpert MRSA Assay (FDA approved for NASAL specimens only), is one component of a comprehensive MRSA colonization surveillance program. It is not intended to diagnose MRSA infection nor to guide or monitor treatment for MRSA infections. Performed at [facility], [place] [address], [place], [place] [address]   SARS Coronavirus 2 (CEPHEID - Performed in [place] hospital lab), Hosp Order     Status: None   Collection Time: 12/17/18   9:43 AM   Specimen: Nasopharyngeal Swab  Result Value Ref Range Status   SARS Coronavirus 2 NEGATIVE NEGATIVE Final    Comment: (NOTE) If result is NEGATIVE SARS-CoV-2 target nucleic acids are NOT DETECTED. The SARS-CoV-2 RNA is generally detectable in upper and lower  respiratory specimens during the acute phase of infection. The lowest  concentration of SARS-CoV-2 viral copies this assay can detect is 250  copies / mL. A negative result does not preclude SARS-CoV-2 infection  and should not be used as the sole basis for treatment or other  patient management decisions.  A negative result may occur with  improper specimen collection / handling, submission of specimen other  than nasopharyngeal swab, presence of viral mutation(s) within the  areas targeted by this assay, and inadequate number of viral copies  (<250 copies / mL). A negative result must be combined with clinical  observations, patient history, and epidemiological information. If result is POSITIVE SARS-CoV-2 target nucleic acids are DETECTED. The SARS-CoV-2 RNA is generally detectable in upper and lower  respiratory specimens dur ing the acute phase of infection.  Positive  results are indicative of active infection with SARS-CoV-2.  Clinical  correlation with patient history and other diagnostic information is  necessary to  determine patient infection status.  Positive results do  not rule out bacterial infection or co-infection with other viruses. If result is PRESUMPTIVE POSTIVE SARS-CoV-2 nucleic acids MAY BE PRESENT.   A presumptive positive result was obtained on the submitted specimen  and confirmed on repeat testing.  While 2019 novel coronavirus  (SARS-CoV-2) nucleic acids may be present in the submitted sample  additional confirmatory testing may be necessary for epidemiological  and / or clinical management purposes  to differentiate between  SARS-CoV-2 and other Sarbecovirus currently known to infect  humans.  If clinically indicated additional testing with an alternate test  methodology [phone]) is advised. The SARS-CoV-2 RNA is generally  detectable in upper and lower respiratory sp ecimens during the acute  phase of infection. The expected result is Negative. Fact Sheet for Patients:  StrictlyIdeas.no Fact Sheet for Healthcare Providers: BankingDealers.co.za This test is not yet approved or cleared by the [place] FDA and has been authorized for detection and/or diagnosis of SARS-CoV-2 by FDA under an Emergency Use Authorization (EUA).  This EUA will remain in effect (meaning this test can be used) for the duration of the COVID-19 declaration under Section 564(b)(1) of the Act, 21 U.S.C. section 360bbb-3(b)(1), unless the authorization is terminated or revoked sooner. Performed at [facility], [place] [address], [place], [place] [address]      Labs: BNP (last 3 results) No results for input(s): BNP in the last 8760 hours. Basic Metabolic Panel: Recent Labs  Lab 12/13/18 0142 12/14/18 0153 12/15/18 0546 12/16/18 0406 12/17/18 0759  NA 137 138 140 135 137  K 3.1* 4.0 3.7 3.6 4.6  CL 106 107 110 105 108  CO2 22 24 21* 21* 23  GLUCOSE 91 126* 91 89 90  BUN 18 15 26* 18 11  CREATININE 0.74 0.78 0.83 0.60 0.66  CALCIUM 9.1 8.7* 8.5* 8.1* 8.3*  MG 1.5* 1.8 1.7 1.4* 1.6*   Liver Function Tests: Recent Labs  Lab 12/13/18 0142 12/14/18 0153 12/15/18 0546  AST 12* 15 15  ALT 6 8 7   ALKPHOS 75 75 68  BILITOT 0.7 0.4 0.6  PROT 5.1* 4.8* 4.3*  ALBUMIN 2.8* 2.5* 2.4*   No results for input(s): LIPASE, AMYLASE in the last 168 hours. No results for input(s): AMMONIA in the last 168 hours. CBC: Recent Labs  Lab 12/13/18 0142 12/14/18 0153 12/15/18 0546 12/16/18 0406 12/17/18 0759  WBC 4.9 7.9 6.0 5.2 4.7  HGB 10.4* 9.5* 8.0* 7.4* 7.7*  HCT 32.7* 30.2* 25.4* 23.5* 24.3*  MCV 86.1 85.6 87.0 85.8 85.0  PLT  190 198 153 144* 151   Cardiac Enzymes: No results for input(s): CKTOTAL, CKMB, CKMBINDEX, TROPONINI in the last 168 hours. BNP: Invalid input(s): POCBNP CBG: Recent Labs  Lab 12/11/18 1147 12/11/18 1619 12/11/18 2141 12/12/18 0818  GLUCAP 95 106* 87 91   D-Dimer No results for input(s): DDIMER in the last 72 hours. Hgb A1c No results for input(s): HGBA1C in the last 72 hours. Lipid Profile No results for input(s): CHOL, HDL, LDLCALC, TRIG, CHOLHDL, LDLDIRECT in the last 72 hours. Thyroid function studies No results for input(s): TSH, T4TOTAL, T3FREE, THYROIDAB in the last 72 hours.  Invalid input(s): FREET3 Anemia work up No results for input(s): VITAMINB12, FOLATE, FERRITIN, TIBC, IRON, RETICCTPCT in the last 72 hours. Urinalysis    Component Value Date/Time   COLORURINE YELLOW 12/04/2018 2341   APPEARANCEUR CLEAR 12/04/2018 2341   LABSPEC 1.018 12/04/2018 2341   PHURINE 5.0 12/04/2018 2341  GLUCOSEU NEGATIVE 12/04/2018 2341   HGBUR SMALL (A) 12/04/2018 2341   BILIRUBINUR NEGATIVE 12/04/2018 2341   KETONESUR NEGATIVE 12/04/2018 2341   PROTEINUR NEGATIVE 12/04/2018 2341   UROBILINOGEN 1.0 06/26/2010 1009   NITRITE POSITIVE (A) 12/04/2018 2341   LEUKOCYTESUR LARGE (A) 12/04/2018 2341   Sepsis Labs Invalid input(s): PROCALCITONIN,  WBC,  LACTICIDVEN Microbiology Recent Results (from the past 240 hour(s))  MRSA PCR Screening     Status: None   Collection Time: 12/08/18 12:13 PM   Specimen: Nasal Mucosa; Nasopharyngeal  Result Value Ref Range Status   MRSA by PCR NEGATIVE NEGATIVE Final    Comment:        The GeneXpert MRSA Assay (FDA approved for NASAL specimens only), is one component of a comprehensive MRSA colonization surveillance program. It is not intended to diagnose MRSA infection nor to guide or monitor treatment for MRSA infections. Performed at [facility], [place] [address] [place], [place] [address]   SARS Coronavirus 2 (CEPHEID -  Performed in [place] hospital lab), Hosp Order     Status: None   Collection Time: 12/17/18  9:43 AM   Specimen: Nasopharyngeal Swab  Result Value Ref Range Status   SARS Coronavirus 2 NEGATIVE NEGATIVE Final    Comment: (NOTE) If result is NEGATIVE SARS-CoV-2 target nucleic acids are NOT DETECTED. The SARS-CoV-2 RNA is generally detectable in upper and lower  respiratory specimens during the acute phase of infection. The lowest  concentration of SARS-CoV-2 viral copies this assay can detect is 250  copies / mL. A negative result does not preclude SARS-CoV-2 infection  and should not be used as the sole basis for treatment or other  patient management decisions.  A negative result may occur with  improper specimen collection / handling, submission of specimen other  than nasopharyngeal swab, presence of viral mutation(s) within the  areas targeted by this assay, and inadequate number of viral copies  (<250 copies / mL). A negative result must be combined with clinical  observations, patient history, and epidemiological information. If result is POSITIVE SARS-CoV-2 target nucleic acids are DETECTED. The SARS-CoV-2 RNA is generally detectable in upper and lower  respiratory specimens dur ing the acute phase of infection.  Positive  results are indicative of active infection with SARS-CoV-2.  Clinical  correlation with patient history and other diagnostic information is  necessary to determine patient infection status.  Positive results do  not rule out bacterial infection or co-infection with other viruses. If result is PRESUMPTIVE POSTIVE SARS-CoV-2 nucleic acids MAY BE PRESENT.   A presumptive positive result was obtained on the submitted specimen  and confirmed on repeat testing.  While 2019 novel coronavirus  (SARS-CoV-2) nucleic acids may be present in the submitted sample  additional confirmatory testing may be necessary for epidemiological  and / or clinical management  purposes  to differentiate between  SARS-CoV-2 and other Sarbecovirus currently known to infect humans.  If clinically indicated additional testing with an alternate test  methodology [phone]) is advised. The SARS-CoV-2 RNA is generally  detectable in upper and lower respiratory sp ecimens during the acute  phase of infection. The expected result is Negative. Fact Sheet for Patients:  StrictlyIdeas.no Fact Sheet for Healthcare Providers: BankingDealers.co.za This test is not yet approved or cleared by the [place] FDA and has been authorized for detection and/or diagnosis of SARS-CoV-2 by FDA under an Emergency Use Authorization (EUA).  This EUA will remain in effect (meaning this test can be  used) for the duration of the COVID-19 declaration under Section 564(b)(1) of the Act, 21 U.S.C. section 360bbb-3(b)(1), unless the authorization is terminated or revoked sooner. Performed at [facility], [place] [address] [place], [place] [address]      Time coordinating discharge:  I have spent 35 minutes face to face with the patient and on the ward discussing the patients care, assessment, plan and disposition with other care givers. >50% of the time was devoted counseling the patient about the risks and benefits of treatment/Discharge disposition and coordinating care.   SIGNED:   Damita Lack, MD  [facility] 12/18/2018, 7:55 AM   If 7PM-7AM, please contact night-coverage [email or web address]

## 2018-12-19 DIAGNOSIS — M4324 Fusion of spine, thoracic region: Secondary | ICD-10-CM | POA: Diagnosis not present

## 2018-12-19 DIAGNOSIS — M4620 Osteomyelitis of vertebra, site unspecified: Secondary | ICD-10-CM | POA: Diagnosis not present

## 2018-12-19 DIAGNOSIS — R0902 Hypoxemia: Secondary | ICD-10-CM | POA: Diagnosis not present

## 2018-12-19 DIAGNOSIS — Z743 Need for continuous supervision: Secondary | ICD-10-CM | POA: Diagnosis not present

## 2018-12-19 DIAGNOSIS — B356 Tinea cruris: Secondary | ICD-10-CM | POA: Diagnosis not present

## 2018-12-19 DIAGNOSIS — N179 Acute kidney failure, unspecified: Secondary | ICD-10-CM | POA: Diagnosis not present

## 2018-12-19 DIAGNOSIS — R279 Unspecified lack of coordination: Secondary | ICD-10-CM | POA: Diagnosis not present

## 2018-12-19 DIAGNOSIS — R12 Heartburn: Secondary | ICD-10-CM | POA: Diagnosis not present

## 2018-12-19 DIAGNOSIS — M5416 Radiculopathy, lumbar region: Secondary | ICD-10-CM | POA: Diagnosis not present

## 2018-12-19 DIAGNOSIS — M4800 Spinal stenosis, site unspecified: Secondary | ICD-10-CM | POA: Diagnosis not present

## 2018-12-19 DIAGNOSIS — R159 Full incontinence of feces: Secondary | ICD-10-CM | POA: Diagnosis not present

## 2018-12-19 DIAGNOSIS — R6889 Other general symptoms and signs: Secondary | ICD-10-CM | POA: Diagnosis not present

## 2018-12-19 DIAGNOSIS — Z79899 Other long term (current) drug therapy: Secondary | ICD-10-CM | POA: Diagnosis not present

## 2018-12-19 DIAGNOSIS — Z4789 Encounter for other orthopedic aftercare: Secondary | ICD-10-CM | POA: Diagnosis not present

## 2018-12-19 DIAGNOSIS — M545 Low back pain: Secondary | ICD-10-CM | POA: Diagnosis not present

## 2018-12-19 DIAGNOSIS — R410 Disorientation, unspecified: Secondary | ICD-10-CM | POA: Diagnosis not present

## 2018-12-19 DIAGNOSIS — R319 Hematuria, unspecified: Secondary | ICD-10-CM | POA: Diagnosis not present

## 2018-12-19 DIAGNOSIS — R2681 Unsteadiness on feet: Secondary | ICD-10-CM | POA: Diagnosis not present

## 2018-12-19 DIAGNOSIS — I5032 Chronic diastolic (congestive) heart failure: Secondary | ICD-10-CM | POA: Diagnosis not present

## 2018-12-19 DIAGNOSIS — G20 Parkinson's disease: Secondary | ICD-10-CM | POA: Diagnosis not present

## 2018-12-19 DIAGNOSIS — Z981 Arthrodesis status: Secondary | ICD-10-CM | POA: Diagnosis not present

## 2018-12-19 DIAGNOSIS — M4625 Osteomyelitis of vertebra, thoracolumbar region: Secondary | ICD-10-CM | POA: Diagnosis not present

## 2018-12-19 DIAGNOSIS — M6281 Muscle weakness (generalized): Secondary | ICD-10-CM | POA: Diagnosis not present

## 2018-12-19 DIAGNOSIS — I4891 Unspecified atrial fibrillation: Secondary | ICD-10-CM | POA: Diagnosis not present

## 2018-12-19 DIAGNOSIS — I951 Orthostatic hypotension: Secondary | ICD-10-CM | POA: Diagnosis not present

## 2018-12-19 DIAGNOSIS — D649 Anemia, unspecified: Secondary | ICD-10-CM | POA: Diagnosis not present

## 2018-12-19 DIAGNOSIS — I5040 Unspecified combined systolic (congestive) and diastolic (congestive) heart failure: Secondary | ICD-10-CM | POA: Diagnosis not present

## 2018-12-19 DIAGNOSIS — M4326 Fusion of spine, lumbar region: Secondary | ICD-10-CM | POA: Diagnosis not present

## 2018-12-19 DIAGNOSIS — N390 Urinary tract infection, site not specified: Secondary | ICD-10-CM | POA: Diagnosis not present

## 2018-12-19 LAB — BASIC METABOLIC PANEL
Anion gap: 7 (ref 5–15)
BUN: 14 mg/dL (ref 8–23)
CO2: 24 mmol/L (ref 22–32)
Calcium: 8.2 mg/dL — ABNORMAL LOW (ref 8.9–10.3)
Chloride: 104 mmol/L (ref 98–111)
Creatinine, Ser: 0.62 mg/dL (ref 0.44–1.00)
GFR calc Af Amer: >60 mL/min (ref >60)
GFR calc non Af Amer: >60 mL/min (ref >60)
Glucose, Bld: 90 mg/dL (ref 70–99)
Potassium: 3.5 mmol/L (ref 3.5–5.1)
Sodium: 135 mmol/L (ref 135–145)

## 2018-12-19 LAB — CBC
HCT: 24 % — ABNORMAL LOW (ref 36.0–46.0)
Hemoglobin: 7.5 g/dL — ABNORMAL LOW (ref 12.0–15.0)
MCH: 27.3 pg (ref 26.0–34.0)
MCHC: 31.3 g/dL (ref 30.0–36.0)
MCV: 87.3 fL (ref 80.0–100.0)
Platelets: 173 10*3/uL (ref 150–400)
RBC: 2.75 MIL/uL — ABNORMAL LOW (ref 3.87–5.11)
RDW: 15.9 % — ABNORMAL HIGH (ref 11.5–15.5)
WBC: 5.1 10*3/uL (ref 4.0–10.5)
nRBC: 0 % (ref 0.0–0.2)

## 2018-12-19 LAB — MAGNESIUM: Magnesium: 1.6 mg/dL — ABNORMAL LOW (ref 1.7–2.4)

## 2018-12-19 LAB — PROTIME-INR
INR: 1 (ref 0.8–1.2)
Prothrombin Time: 13.4 seconds (ref 11.4–15.2)

## 2018-12-19 NOTE — TOC Progression Note (Signed)
Transition of Care [facility]) - Progression Note    Patient Details  Name: [name] MRN: [id number] Date of Birth: 04-14-1941  Transition of Care [facility]) CM/SW [place], [place] Phone Number: 12/19/2018, 8:45 AM  Clinical Narrative:     CSW has received insurance approval # [id number].  Thurmond Butts, MSW, [facility] Clinical Social Worker [phone]   Expected Discharge Plan: Skilled Nursing Facility(Or home health, no agreed upon disposition at this time.) Barriers to Discharge: Continued Medical Work up, Ship broker  Expected Discharge Plan and Services Expected Discharge Plan: Skilled Nursing Facility(Or home health, no agreed upon disposition at this time.)       Living arrangements for the past 2 months: Single Family Home Expected Discharge Date: 12/18/18                                     Social Determinants of Health (SDOH) Interventions    Readmission Risk Interventions Readmission Risk Prevention Plan 10/02/2018  Transportation Screening Complete  PCP or Specialist Appt within 5-7 Days Complete  Home Care Screening Complete  Medication Review (RN CM) Complete  Some recent data might be hidden

## 2018-12-19 NOTE — TOC Transition Note (Signed)
Transition of Care [facility]) - CM/SW Discharge Note   Patient Details  Name: [name] MRN: [id number] Date of Birth: 08/22/1940  Transition of Care [facility] [place] [place]) CM/SW Contact:  Vinie Sill, [place] Phone Number: 12/19/2018, 10:54 AM   Clinical Narrative:     Patient will DC to: [place] Date: 70/14/2020 Family Notified: [name], spouse  Transport By: Corey Harold  RN, patient, and facility notified of DC. Discharge Summary sent to facility. RN given number for report [phone], Room 207. Ambulance transport requested for patient.   Clinical Social Worker signing off. Thurmond Butts, MSW, [facility] Clinical Social Worker [phone]     Final next level of care: Skilled Nursing Facility Barriers to Discharge: Barriers Resolved   Patient Goals and CMS Choice Patient states their goals for this hospitalization and ongoing recovery are:: "I just don't want to feel as much pain."   Choice offered to / list presented to : Patient, Adult Children(Declined, wanted to wait until after decision on surgery is made.)  Discharge Placement PASRR number recieved: 12/13/18            Patient chooses bed at: [place], [place] Patient to be transferred to facility by: [place] Name of family member notified: [name], spouse Patient and family notified of of transfer: 12/19/18  Discharge Plan and Services                                     Social Determinants of Health (SDOH) Interventions     Readmission Risk Interventions Readmission Risk Prevention Plan 10/02/2018  Transportation Screening Complete  PCP or Specialist Appt within 5-7 Days Complete  Home Care Screening Complete  Medication Review (RN CM) Complete  Some recent data might be hidden

## 2018-12-19 NOTE — Progress Notes (Signed)
Patient discharged yesterday, awaiting insurance approval.  Shortness improved this morning.  Discharge summary updated, no complaints. Patient seen and examined at bedside, she does not have any complaints.  She is tolerating oral diet without any issues.  Vital signs are stable. Rest of the plan as discussed on discharge summary from 12/18/2018.  Call with any questions as needed. Dispo planning discussed with Education officer, museum.  Gerlean Ren MD [facility]

## 2018-12-24 DIAGNOSIS — B356 Tinea cruris: Secondary | ICD-10-CM | POA: Diagnosis not present

## 2018-12-24 DIAGNOSIS — M6281 Muscle weakness (generalized): Secondary | ICD-10-CM | POA: Diagnosis not present

## 2018-12-24 DIAGNOSIS — R159 Full incontinence of feces: Secondary | ICD-10-CM | POA: Diagnosis not present

## 2018-12-24 DIAGNOSIS — M4625 Osteomyelitis of vertebra, thoracolumbar region: Secondary | ICD-10-CM | POA: Diagnosis not present

## 2018-12-25 ENCOUNTER — Ambulatory Visit: Payer: Medicare Other | Admitting: Infectious Diseases

## 2018-12-25 ENCOUNTER — Telehealth: Payer: Self-pay | Admitting: *Deleted

## 2018-12-25 ENCOUNTER — Telehealth: Payer: Self-pay

## 2018-12-25 NOTE — Telephone Encounter (Signed)
Patient has been discharged from [facility] in [place]. Patient needs to continue Doxycyline. And possibly have daughter come to next follow up appointment if available.  Eugenia Mcalpine, LPN

## 2018-12-25 NOTE — Telephone Encounter (Signed)
Nurse from [facility] called to rescheduled the patient missed appt.  She also wanted to get an extension on the oral Doxy she has been taking until the next appt date. After speaking with Dr Prince Rome advised ok to continue the medication at current dose until she sees provider 01/11/19.

## 2018-12-27 ENCOUNTER — Telehealth: Payer: Self-pay

## 2018-12-27 NOTE — Telephone Encounter (Signed)
Katie from [facility] called office to confirm we received labs/x ray via fax. Also to see if there has been any order changes.  Was able to locate labs for Dr. Prince Rome to review. No order changes at this moment. Informed Katie this before ending call. [place]

## 2019-01-01 ENCOUNTER — Encounter: Payer: Self-pay | Admitting: Physician Assistant

## 2019-01-01 ENCOUNTER — Other Ambulatory Visit: Payer: Self-pay

## 2019-01-01 ENCOUNTER — Ambulatory Visit (INDEPENDENT_AMBULATORY_CARE_PROVIDER_SITE_OTHER): Payer: Medicare Other | Admitting: Physician Assistant

## 2019-01-01 VITALS — BP 90/52 | HR 79 | Ht 65.0 in

## 2019-01-01 DIAGNOSIS — I951 Orthostatic hypotension: Secondary | ICD-10-CM

## 2019-01-01 DIAGNOSIS — I5032 Chronic diastolic (congestive) heart failure: Secondary | ICD-10-CM

## 2019-01-01 DIAGNOSIS — I4891 Unspecified atrial fibrillation: Secondary | ICD-10-CM

## 2019-01-01 MED ORDER — POTASSIUM CHLORIDE CRYS ER 20 MEQ PO TBCR: 20.0000 meq | EXTENDED_RELEASE_TABLET | Freq: Every day | ORAL | 3 refills | Status: AC

## 2019-01-01 MED ORDER — FUROSEMIDE 20 MG PO TABS: 20.0000 mg | ORAL_TABLET | Freq: Every day | ORAL | 3 refills | Status: DC

## 2019-01-01 NOTE — Progress Notes (Signed)
Cardiology Office Note    Date:  01/01/2019   ID:  TWINKLE SOCKWELL, DOB [birth year], MRN [id number]  PCP:  [name], MD  Cardiologist:  [name]   Chief Complaint: 12 Months follow up  History of Present Illness:   [name] is a 78 y.o. female HTN, OSA intolerance to CPAP, HLD and PAF seen for follow up.   Cath 07/2000 showed normal coronaries. Echocardiogram in 2015 showed normal LV function with EF 60-65% and normal wall motion.  Myoview in 02/2014 showed normal EF and no ischemia. On Flecainide and warfarin for atrial fibrillation. Last seen by [name] 10/2017.  Admitted 6/29-7/14 for discitis/osteomyelitis with concerns of abscess versus old hematoma. S/p total facetectomy, revision of all construct and new fusion construct of T9-L3 by neurosurgery 7/8. ID recommended abx until 07/2018.  Here today for follow up from SNF.  At facility patient had syncope episode last week secondary to orthostatic hypotension.  Reduce Toprol-XL from 100 mg to 25 mg daily.  No recurrent syncope since then.  Patient denies chest pain, shortness of breath, dizziness, orthopnea, PND, lower extremity edema or melena.  Past Medical History:  Diagnosis Date  . Allergic rhinitis   . Arthritis    "all over"  . CHF (congestive heart failure) ([place])   . Chronic back pain   . DJD (degenerative joint disease)   . 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 GERD (gastroesophageal reflux disease)   . Hyperlipidemia   . Hypertension   . Lumbar stenosis   . OSA (obstructive sleep apnea)    "couldn't handle CPAP; use mouth guard some; not all the time" (01/06/2014)  . PAF (paroxysmal atrial fibrillation) ([place])   . Scoliosis    with radiculopathy L2-S1 with prior surgery  . Small bowel obstruction ([facility])    versus ileus after last bck surgery  . Spondylosis     Past Surgical History:  Procedure Laterality Date  . BACK SURGERY    . CATARACT EXTRACTION W/ INTRAOCULAR LENS  IMPLANT, BILATERAL  2011   . CHOLECYSTECTOMY N/A 07/07/2018   Procedure: LAPAROSCOPIC CHOLECYSTECTOMY;  Surgeon: Erroll Luna, MD;  Location: [facility];  Service: General;  Laterality: N/A;  . COLONOSCOPY  12/09/2004  . LIPOMA EXCISION  1980's   "fatty tumors"  . [place] SURGERY  02/2009   "ruptured disc"  . NASAL SEPTUM SURGERY  80's  . POSTERIOR LUMBAR FUSION  06/2010; 10/2011   "placed screws, rods, spacers both times"  . POSTERIOR LUMBAR FUSION 4 LEVEL N/A 12/13/2018   Procedure: THORACIC NINE -LUMBAR THREE POSTERIOR INSTRUMENTATION FUSION;  Surgeon: Judith Part, MD;  Location: [facility];  Service: Neurosurgery;  Laterality: N/A;  . REPAIR DURAL / CSF LEAK  02/2009  . THORACIC LAMINECTOMY FOR EPIDURAL ABSCESS N/A 09/28/2018   Procedure: THORACIC NINE-THORACIC TEN, THORACIC TEN-THORACIC ELEVEN, THORACIC ELEVEN-THORACIC TWELVE LAMINECTOMIES FOR EPIDURAL ABSCESS;  Surgeon: Judith Part, MD;  Location: [facility];  Service: Neurosurgery;  Laterality: N/A;  . TOTAL ABDOMINAL HYSTERECTOMY  02/1993    Current Medications:  Prior to Admission medications   Medication Sig Start Date End Date Taking? Authorizing Provider  acetaminophen (TYLENOL) 325 MG tablet Take 650 mg by mouth every 6 (six) hours as needed.   Yes [provider]  calcium carbonate (TUMS - DOSED IN MG ELEMENTAL CALCIUM) 500 MG chewable tablet Chew 1 tablet by mouth 2 (two) times daily.    Yes [provider]  carbidopa-levodopa (SINEMET IR) 25-100 MG tablet Take  2 tablets by mouth 3 (three) times daily. 07/09/18  Yes Sheikh, Omair Latif, DO  Cholecalciferol (HM VITAMIN D3) 100 MCG (4000 UT) CAPS Take 8,000 Units by mouth daily.   Yes [provider]  doxycycline (VIBRA-TABS) 100 MG tablet Take 1 tablet (100 mg total) by mouth 2 (two) times daily. 11/22/18  Yes Powers, Evern Core, MD  flecainide (TAMBOCOR) 50 MG tablet Take 1 tablet (50 mg total) by mouth 2 (two) times daily. 12/18/18 01/17/19 Yes Amin, Ankit Chirag, MD  furosemide  (LASIX) 40 MG tablet Take 40 mg by mouth daily.   Yes [provider]  gabapentin (NEURONTIN) 600 MG tablet Take 600 mg by mouth 2 (two) times daily.   Yes [provider]  HYDROcodone-acetaminophen (NORCO/VICODIN) 5-325 MG tablet Take 1 tablet by mouth every 6 (six) hours as needed for moderate pain.   Yes [provider]  liver oil-zinc oxide (DESITIN) 40 % ointment Apply 1 application topically as needed for irritation.   Yes [provider]  Melatonin 10 MG TABS Take 10 mg by mouth at bedtime.   Yes [provider]  methocarbamol (ROBAXIN) 500 MG tablet Take 0.5 tablets (250 mg total) by mouth 2 (two) times a day for 20 days. 12/18/18 01/07/19 Yes Amin, Jeanella Flattery, MD  metoprolol succinate (TOPROL-XL) 50 MG 24 hr tablet Take 25 mg by mouth daily. Take with or immediately following a meal.   Yes [provider]  nystatin (MYCOSTATIN/NYSTOP) powder Apply 1 g topically 3 (three) times daily.   Yes [provider]  pantoprazole (PROTONIX) 40 MG tablet Take 40 mg by mouth 2 (two) times daily.    Yes [provider]  polyethylene glycol (MIRALAX / GLYCOLAX) packet Take 17 g by mouth daily. 07/10/18  Yes Sheikh, Omair Latif, DO  potassium chloride SA (K-DUR) 20 MEQ tablet Take 40 mEq by mouth daily.   Yes [provider]  pramipexole (MIRAPEX) 1 MG tablet Take 0.5 mg by mouth at bedtime.    Yes [provider]  pravastatin (PRAVACHOL) 40 MG tablet Take 40 mg by mouth at bedtime.    Yes [provider]  senna-docusate (SENOKOT-S) 8.6-50 MG tablet Take 2 tablets by mouth at bedtime as needed for mild constipation or moderate constipation. 12/18/18 01/17/19 Yes Amin, Ankit Chirag, MD  TRAVATAN Z 0.004 % SOLN ophthalmic solution Place 1 drop into both eyes at bedtime.  01/21/15  Yes [provider]  Triamcinolone Acetonide (TRIAMCINOLONE 0.1 % CREAM : EUCERIN) CREA Apply 1 application topically 2 (two) times  daily.   Yes [provider]  warfarin (COUMADIN) 3 MG tablet Take 1.5 mg by mouth daily.    Yes [provider]    Allergies:   Lyrica [pregabalin], Zocor [simvastatin], and Relafen [nabumetone]   Social History   Socioeconomic History  . Marital status: Married    Spouse name: [name]  . Number of children: 2  . Years of education: 12th   . Highest education level: Not on file  Occupational History  . Occupation: retired    Fish farm manager: RETIRED    Comment: clerical office work  Social Needs  . Financial resource strain: Not on file  . Food insecurity    Worry: Not on file    Inability: Not on file  . Transportation needs    Medical: Not on file    Non-medical: Not on file  Tobacco Use  . Smoking status: Never Smoker  . Smokeless tobacco: Never Used  Substance and Sexual Activity  . Alcohol use: No    Alcohol/week: 0.0 standard drinks    Comment: 02/01/2014 " glasseof wine once in a blue moon; < once/year"  . Drug use: No  . Sexual activity: Never    Comment: hysterectomy  Lifestyle  . Physical activity    Days per week: Not on file    Minutes per session: Not on file  . Stress: Not on file  Relationships  . Social Herbalist on phone: Not on file    Gets together: Not on file    Attends religious service: Not on file    Active member of club or organization: Not on file    Attends meetings of clubs or organizations: Not on file    Relationship status: Not on file  Other Topics Concern  . Not on file  Social History Narrative   Patient lives at home with husband [name].   Patient is retired.   Patient has a high school education.    Patient has 2 children      Family History:  The patient's family history includes Allergies in her sister; Arthritis in her mother; Asthma in her sister; Heart attack in her father; Hypertension in her sister; Lung cancer in her sister.   ROS:   Please see the history of present illness.    ROS All other  systems reviewed and are negative.   PHYSICAL EXAM:   VS:  BP (!) 90/52   Pulse 79   Ht 5\' 5"  (1.651 m)   SpO2 98%   BMI 27.79 kg/m    GEN: Well nourished, well developed, in no acute distress  HEENT: normal  Neck: no JVD, carotid bruits, or masses Cardiac: RRR; no murmurs, rubs, or gallops, trace bilateral lower extremity edema with compression stocking Respiratory:  clear to auscultation bilaterally, normal work of breathing GI: soft, nontender, nondistended, + BS MS: no deformity or atrophy  Skin: warm and dry, no rash Neuro:  Alert and Oriented x 3, Strength and sensation are intact Psych: euthymic mood, full affect  Wt Readings from Last 3 Encounters:  11/22/18 167 lb (75.8 kg)  11/02/18 197 lb (89.4 kg)  10/17/18 172 lb 6.4 oz (78.2 kg)      Studies/Labs Reviewed:   EKG:  EKG is ordered today.  The ekg ordered today demonstrates sinus rhythm at rate of 79 bpm, nonspecific T wave abnormality  Recent Labs: 12/15/2018: ALT 7 12/19/2018: BUN 14; Creatinine, Ser 0.62; Hemoglobin 7.5; Magnesium 1.6; Platelets 173; Potassium 3.5; Sodium 135   Lipid Panel    Component Value Date/Time   CHOL  12/22/2006 0550    183        ATP III CLASSIFICATION:  <200     mg/dL   Desirable  200-239  mg/dL   Borderline High  >=240    mg/dL   High   TRIG 174 (H) 12/22/2006 0550   HDL 47 12/22/2006 0550   CHOLHDL 3.9 12/22/2006 0550   VLDL 35 12/22/2006 0550   LDLCALC (H) 12/22/2006 0550    101        Total Cholesterol/HDL:CHD Risk Coronary Heart Disease Risk Table                     Men   Women  1/2 Average Risk   3.4   3.3    Additional studies/ records that were reviewed today include:   As summarized above  ASSESSMENT & PLAN:    1. PAF -Sinus rhythm.  Continue flecainide, Toprol and warfarin.  No bleeding issue.   2.  Orthostatic hypotension with history of hypertension -No recurrent orthostasis since reducing Toprol-XL to 25 mg daily.  Will continue current dose.   Patient looks on the drier side.  I have encouraged to increase fluid intake.  Will reduce Lasix to prior dose of 20 mg daily with supplemental potassium 20 MDQ daily.  She will need BMP checked in a week.  3. OSA - Intolerance to CPAP  4. Osteomyelitis and recent back surgery - Undergoing rehab [facility]    Medication Adjustments/Labs and Tests Ordered: Current medicines are reviewed at length with the patient today.  Concerns regarding medicines are outlined above.  Medication changes, Labs and Tests ordered today are listed in the Patient Instructions below. Patient Instructions  Medication Instructions:  Your physician has recommended you make the following change in your medication:  1.  REDUCE the Lasix to 20 mg taking 1 tablet daily 2.  REDUCE the Potassium to 20 meq taking 1 daily   If you need a refill on your cardiac medications before your next appointment, please call your pharmacy.   Lab work: 1 WEEK AT FACILITY:  [facility]  If you have labs (blood work) drawn today and your tests are completely normal, you will receive your results only by: Marland Kitchen MyChart Message (if you have MyChart) OR . A paper copy in the mail If you have any lab test that is abnormal or we need to change your treatment, we will call you to review the results.  Testing/Procedures: None ordered  Follow-Up: At [facility], you and your health needs are our priority.  As part of our continuing mission to provide you with exceptional heart care, we have created designated Provider Care Teams.  These Care Teams include your primary Cardiologist (physician) and Advanced Practice Providers (APPs -  Physician Assistants and Nurse Practitioners) who all work together to provide you with the care you need, when you need it. You will need a follow up appointment in 6 months.  Please call our office 2 months in advance to schedule this appointment.  You may see Jenkins Rouge, MD or one of the following Advanced Practice  Providers on your designated Care Team:   Truitt Merle, NP Cecilie Kicks, NP . Kathyrn Drown, NP  Any Other Special Instructions Will Be Listed Below (If Applicable).       Jarrett Soho, [place]  01/01/2019 10:16 AM    [address], [place], [place]  [address] Phone: [phone]; Fax: [phone]

## 2019-01-01 NOTE — Patient Instructions (Addendum)
Medication Instructions:  Your physician has recommended you make the following change in your medication:  1.  REDUCE the Lasix to 20 mg taking 1 tablet daily 2.  REDUCE the Potassium to 20 meq taking 1 daily   If you need a refill on your cardiac medications before your next appointment, please call your pharmacy.   Lab work: 1 WEEK AT FACILITY:  [facility]  If you have labs (blood work) drawn today and your tests are completely normal, you will receive your results only by: Marland Kitchen MyChart Message (if you have MyChart) OR . A paper copy in the mail If you have any lab test that is abnormal or we need to change your treatment, we will call you to review the results.  Testing/Procedures: None ordered  Follow-Up: At [facility], you and your health needs are our priority.  As part of our continuing mission to provide you with exceptional heart care, we have created designated Provider Care Teams.  These Care Teams include your primary Cardiologist (physician) and Advanced Practice Providers (APPs -  Physician Assistants and Nurse Practitioners) who all work together to provide you with the care you need, when you need it. You will need a follow up appointment in 6 months.  Please call our office 2 months in advance to schedule this appointment.  You may see Jenkins Rouge, MD or one of the following Advanced Practice Providers on your designated Care Team:   Truitt Merle, NP Cecilie Kicks, NP . Kathyrn Drown, NP  Any Other Special Instructions Will Be Listed Below (If Applicable).

## 2019-01-05 ENCOUNTER — Encounter (HOSPITAL_COMMUNITY): Payer: Self-pay | Admitting: Neurological Surgery

## 2019-01-06 DIAGNOSIS — R410 Disorientation, unspecified: Secondary | ICD-10-CM | POA: Diagnosis not present

## 2019-01-06 DIAGNOSIS — M5416 Radiculopathy, lumbar region: Secondary | ICD-10-CM | POA: Diagnosis not present

## 2019-01-06 DIAGNOSIS — Z981 Arthrodesis status: Secondary | ICD-10-CM | POA: Diagnosis not present

## 2019-01-06 DIAGNOSIS — B356 Tinea cruris: Secondary | ICD-10-CM | POA: Diagnosis not present

## 2019-01-11 ENCOUNTER — Ambulatory Visit (INDEPENDENT_AMBULATORY_CARE_PROVIDER_SITE_OTHER): Payer: Medicare Other | Admitting: Infectious Diseases

## 2019-01-11 ENCOUNTER — Other Ambulatory Visit: Payer: Self-pay

## 2019-01-11 ENCOUNTER — Encounter: Payer: Self-pay | Admitting: Infectious Diseases

## 2019-01-11 VITALS — BP 92/64 | HR 90 | Temp 97.9°F

## 2019-01-11 DIAGNOSIS — M4620 Osteomyelitis of vertebra, site unspecified: Secondary | ICD-10-CM | POA: Diagnosis not present

## 2019-01-11 DIAGNOSIS — G20 Parkinson's disease: Secondary | ICD-10-CM

## 2019-01-11 MED ORDER — DOXYCYCLINE HYCLATE 100 MG PO TABS: 100.0000 mg | ORAL_TABLET | Freq: Two times a day (BID) | ORAL | 1 refills | Status: DC

## 2019-01-11 NOTE — Patient Instructions (Signed)
Continue doxycycline 100 mg PO BID until next visit with Dr. Prince Rome in 2 months.

## 2019-01-11 NOTE — Progress Notes (Signed)
Subjective:    Patient ID: [name], female    DOB: May 02, 1941, 78 y.o.   MRN: [id number]  HPI The patient is a 78 year old white female with advanced Parkinson's disease who underwent lumbar laminectomy and fusion earlier this year and developed a subsequent postoperative epidural abscess and vertebral osteomyelitis with retained hardware.  She was hospitalized at [facility] in late April 2020 at which time she underwent debridement and drainage of her epidural abscess with operative cultures that were negative but followed several doses of antibiotics preoperatively.  She is due to complete 8 weeks of IV Rocephin and vancomycin tomorrow on November 23, 2018.  Following her hospital discharge, she was transferred from 1 skilled nursing facility but her family was unhappy with to [facility] skilled nursing facility in [facility].  She has remained an patient at this latter facility for the last 5 weeks.  Review of her weekly labs shows that her white blood cell count has remained within normal limits as has her creatinine.  Her vancomycin level was subtherapeutic 2 weeks ago at 12.6 but has now was risen to within the preferred range of 15-20.  Her CRP has also now improved from 1.43 to less than 0.60.  The patient and her nurse both state that she has been doing well with only expected back pain.  The stitches from her spinal wound were removed several weeks ago per her skilled nursing facility nurse.  She is not allowed to be present in our office today as her skilled nursing facility is currently quarantining patient due to the recent coronavirus pandemic.  Patient has had difficulty participating in rehab efforts due to participation and issues with her Parkinson medications.  Her nurse states that the hallucination she was having previously have improved since her neurologist decreased her Sinemet dosing.  Nursing reports that the patient has an anticipated discharge date on  Friday following completion of her antibiotic therapy.  Per nursing report the patient will be discharged to her daughter's home under her care.   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   GERD (gastroesophageal reflux disease)    Hyperlipidemia    Hypertension    Lumbar stenosis    OSA (obstructive sleep apnea)    "couldn't handle CPAP; use mouth guard some; not all the time" (01/06/2014)   PAF (paroxysmal atrial fibrillation) ([place])    Scoliosis    with radiculopathy L2-S1 with prior surgery   Small bowel obstruction ([place])    versus ileus after last bck surgery   Spondylosis     Past Surgical History:  Procedure Laterality Date   BACK SURGERY     CATARACT EXTRACTION W/ INTRAOCULAR LENS  IMPLANT, BILATERAL  2011   CHOLECYSTECTOMY N/A 07/07/2018   Procedure: LAPAROSCOPIC CHOLECYSTECTOMY;  Surgeon: Erroll Luna, MD;  Location: [facility];  Service: General;  Laterality: N/A;   COLONOSCOPY  12/09/2004   LIPOMA EXCISION  1980's   "fatty tumors"   [place]  02/2009   "ruptured disc"   NASAL SEPTUM SURGERY  80's   POSTERIOR LUMBAR FUSION  06/2010; 10/2011   "placed screws, rods, spacers both times"   POSTERIOR LUMBAR FUSION 4 LEVEL N/A 12/13/2018   Procedure: THORACIC NINE -LUMBAR THREE POSTERIOR INSTRUMENTATION FUSION;  Surgeon: Judith Part, MD;  Location: [facility];  Service: Neurosurgery;  Laterality: N/A;   REPAIR DURAL / CSF LEAK  02/2009   THORACIC LAMINECTOMY FOR EPIDURAL ABSCESS N/A 09/28/2018   Procedure: THORACIC NINE-THORACIC TEN, THORACIC TEN-THORACIC ELEVEN, THORACIC ELEVEN-THORACIC TWELVE LAMINECTOMIES FOR EPIDURAL ABSCESS;  Surgeon: Judith Part, MD;  Location: [facility];  Service: Neurosurgery;  Laterality: N/A;   TOTAL ABDOMINAL HYSTERECTOMY  02/1993     Family  History  Problem Relation Age of Onset   Arthritis Mother    Heart attack Father    Hypertension Sister    Lung cancer Sister    Asthma Sister    Allergies Sister      Social History   Tobacco Use   Smoking status: Never Smoker   Smokeless tobacco: Never Used  Substance Use Topics   Alcohol use: No    Alcohol/week: 0.0 standard drinks    Comment: 02/01/2014 " glasseof wine once in a blue moon; < once/year"   Drug use: No      reports never being sexually active.   Outpatient Medications Prior to Visit  Medication Sig Dispense Refill   acetaminophen (TYLENOL) 325 MG tablet Take 650 mg by mouth every 6 (six) hours as needed.     calcium carbonate (TUMS - DOSED IN MG ELEMENTAL CALCIUM) 500 MG chewable tablet Chew 1 tablet by mouth 2 (two) times daily.      carbidopa-levodopa (SINEMET IR) 25-100 MG tablet Take 2 tablets by mouth 3 (three) times daily. 90 tablet 0   Cholecalciferol (HM VITAMIN D3) 100 MCG (4000 UT) CAPS Take 8,000 Units by mouth daily.     doxycycline (VIBRA-TABS) 100 MG tablet Take 1 tablet (100 mg total) by mouth 2 (two) times daily. 60 tablet 3   flecainide (TAMBOCOR) 50 MG tablet Take 1 tablet (50 mg total) by mouth 2 (two) times daily. 60 tablet 0   furosemide (LASIX) 20 MG tablet Take 1 tablet (20 mg total) by mouth daily. 90 tablet 3   gabapentin (NEURONTIN) 600 MG tablet Take 600 mg by mouth 2 (two) times daily.     HYDROcodone-acetaminophen (NORCO/VICODIN) 5-325 MG tablet Take 1 tablet by mouth every 6 (six) hours as needed for moderate pain.     liver oil-zinc oxide (DESITIN) 40 % ointment Apply 1 application topically as needed for irritation.     Melatonin 10 MG TABS Take 10 mg by mouth at bedtime.     metoprolol succinate (TOPROL-XL) 50 MG 24 hr tablet Take 25 mg by mouth daily. Take with or immediately following a meal.     pantoprazole (PROTONIX) 40 MG tablet Take 40 mg by mouth 2 (two) times daily.      polyethylene glycol  (MIRALAX / GLYCOLAX) packet Take 17 g by mouth daily. 14 each 0   potassium chloride SA (K-DUR) 20 MEQ tablet Take 1 tablet (20 mEq total) by mouth daily. 90 tablet 3   pramipexole (MIRAPEX) 1 MG tablet Take 0.5 mg by mouth at bedtime.      pravastatin (PRAVACHOL) 40 MG tablet Take 40 mg by mouth at bedtime.      senna-docusate (SENOKOT-S) 8.6-50 MG tablet Take 2 tablets by mouth at bedtime as needed for mild constipation or moderate constipation. 30 tablet 0   TRAVATAN Z 0.004 % SOLN ophthalmic solution Place 1 drop into both eyes at bedtime.   0   Triamcinolone Acetonide (TRIAMCINOLONE 0.1 % CREAM : EUCERIN) CREA Apply 1 application topically 2 (two)  times daily.     warfarin (COUMADIN) 3 MG tablet Take 1.5 mg by mouth daily.      nystatin (MYCOSTATIN/NYSTOP) powder Apply 1 g topically 3 (three) times daily.     No facility-administered medications prior to visit.      Allergies  Allergen Reactions   Lyrica [Pregabalin] Other (See Comments)    Felt loopy   Zocor [Simvastatin] Other (See Comments)    Myalgias   Relafen [Nabumetone] Rash      Review of Systems  Constitutional: Positive for fatigue. Negative for chills and fever.  HENT: Negative for congestion, hearing loss and sinus pressure.   Eyes: Negative for photophobia, discharge, redness and visual disturbance.  Respiratory: Negative for apnea, cough, shortness of breath and wheezing.   Cardiovascular: Negative for chest pain and leg swelling.  Gastrointestinal: Negative for abdominal distention, abdominal pain, constipation, diarrhea, nausea and vomiting.  Endocrine: Negative for cold intolerance, heat intolerance, polydipsia and polyuria.  Genitourinary: Negative for dysuria, flank pain, frequency, urgency, vaginal bleeding and vaginal discharge.  Musculoskeletal: Negative for arthralgias, back pain, joint swelling and neck pain.  Skin: Negative for pallor and rash.  Allergic/Immunologic: Negative for  immunocompromised state.  Neurological: Positive for tremors and weakness. Negative for dizziness, seizures, speech difficulty and headaches.  Hematological: Does not bruise/bleed easily.  Psychiatric/Behavioral: Negative for agitation, confusion, hallucinations and sleep disturbance. The patient is not nervous/anxious.        Objective:    Vitals:   01/11/19 1409  BP: 92/64  Pulse: 90  Temp: 97.9 F (36.6 C)   Physical Exam Gen: pleasant, +cogwheel rigidity, NAD, A&Ox 3 Head: NCAT, no temporal wasting evident EENT: PERRL, EOMI, MMM, adequate dentition Neck: supple, no JVD CV: NRRR, no murmurs evident Pulm: CTA bilaterally, no wheeze or retractions Abd: soft, NTND, +BS Extrems: no LE edema, 2+ pulses Skin: no rashes, adequate skin turgor Neuro: CN II-XII grossly intact, no focal neurologic deficits appreciated aside from resting tremor/cogwheel rigity, gait was not assessed, A&Ox 3   Labs: Lab Results  Component Value Date   WBC 5.1 12/19/2018   HGB 7.5 (L) 12/19/2018   HCT 24.0 (L) 12/19/2018   MCV 87.3 12/19/2018   PLT 173 12/19/2018   Lab Results  Component Value Date   NA 135 12/19/2018   K 3.5 12/19/2018   CL 104 12/19/2018   CO2 24 12/19/2018   GLUCOSE 90 12/19/2018   BUN 14 12/19/2018   CREATININE 0.62 12/19/2018   CALCIUM 8.2 (L) 12/19/2018   MG 1.6 (L) 12/19/2018   PHOS 3.0 07/09/2018   Lab Results  Component Value Date   CRP 7.1 (H) 12/06/2018          Assessment & Plan:  Patient is a 78 year old white female with Parkinson's disease and recent hallucinations who underwent spinal fusion earlier this year complicated by postoperative epidural abscess and vertebral osteomyelitis.  1.  Vertebral osteomyelitis/epidural abscess- as stated above, the patient underwent debridement of an epidural abscess in late April 2020.  Cultures obtained at that time were negative yet purulence was seen intraoperatively the tract to the patient's bone and her  hardware.  She will complete 8 weeks of IV Rocephin and vancomycin as of tomorrow November 23, 2018.  Both her white blood cell count and CRP have returned to normal limits, so her PICC line can be discontinued tomorrow after her last doses of antibiotics.  Due to the patient's remnant hardware and therefore risk for recurrence of infection of approximately 10%,  the plan will be to transition the patient to doxycycline 100 mg p.o. twice daily beginning on November 24, 2018.  A prescription was sent to the patient's outpatient pharmacy of choice and a fax order for the prescription was also sent to [facility] skilled nursing facility in [place], [place].  The patient will require oral consolidative antibiotics for minimum of 6 months but could be as long as 12 months depending on how she tolerates this medication.  Patient was advised to take her antibiotic with some small amount of food in order to reduce her risk of nausea.  Patient will follow-up in my clinic in 3 months for compliance check of her medication.  2.  Parkinson's disease- the patient has had many issues related to medications for treatment of this disease.  While hospitalized and for the last several months per her daughter's report patient has had continued auditory and visual hallucinations.  The side effect of her Sinemet and her underlying Parkinson's disease has interfered with her care and her ability to ambulate in recent weeks.  She has followed with her neurologist who has lowered her Sinemet dosing which has improved many of her visual hallucinations per nursing report.  Unfortunately, the patient has been poorly participatory in efforts to rehab over the same period of time.  I will continue encourage the patient to follow-up with her neurologist for reassessment of her medications and her Parkinson's disease in general.

## 2019-01-12 LAB — CBC WITH DIFFERENTIAL/PLATELET
Absolute Monocytes: 535 cells/uL (ref 200–950)
Basophils Absolute: 21 cells/uL (ref 0–200)
Basophils Relative: 0.4 %
Eosinophils Absolute: 345 cells/uL (ref 15–500)
Eosinophils Relative: 6.5 %
HCT: 32 % — ABNORMAL LOW (ref 35.0–45.0)
Hemoglobin: 10.1 g/dL — ABNORMAL LOW (ref 11.7–15.5)
Lymphs Abs: 906 cells/uL (ref 850–3900)
MCH: 26.2 pg — ABNORMAL LOW (ref 27.0–33.0)
MCHC: 31.6 g/dL — ABNORMAL LOW (ref 32.0–36.0)
MCV: 83.1 fL (ref 80.0–100.0)
MPV: 11.7 fL (ref 7.5–12.5)
Monocytes Relative: 10.1 %
Neutro Abs: 3493 cells/uL (ref 1500–7800)
Neutrophils Relative %: 65.9 %
Platelets: 251 10*3/uL (ref 140–400)
RBC: 3.85 10*6/uL (ref 3.80–5.10)
RDW: 15.3 % — ABNORMAL HIGH (ref 11.0–15.0)
Total Lymphocyte: 17.1 %
WBC: 5.3 10*3/uL (ref 3.8–10.8)

## 2019-01-12 LAB — COMPREHENSIVE METABOLIC PANEL
AG Ratio: 1.3 (calc) (ref 1.0–2.5)
ALT: 16 U/L (ref 6–29)
AST: 35 U/L (ref 10–35)
Albumin: 3.4 g/dL — ABNORMAL LOW (ref 3.6–5.1)
Alkaline phosphatase (APISO): 121 U/L (ref 37–153)
BUN: 18 mg/dL (ref 7–25)
CO2: 25 mmol/L (ref 20–32)
Calcium: 9.2 mg/dL (ref 8.6–10.4)
Chloride: 102 mmol/L (ref 98–110)
Creat: 0.93 mg/dL (ref 0.60–0.93)
Globulin: 2.6 g/dL (calc) (ref 1.9–3.7)
Glucose, Bld: 124 mg/dL — ABNORMAL HIGH (ref 65–99)
Potassium: 4.9 mmol/L (ref 3.5–5.3)
Sodium: 139 mmol/L (ref 135–146)
Total Bilirubin: 0.4 mg/dL (ref 0.2–1.2)
Total Protein: 6 g/dL — ABNORMAL LOW (ref 6.1–8.1)

## 2019-01-12 LAB — C-REACTIVE PROTEIN: CRP: 18.9 mg/L — ABNORMAL HIGH (ref <8.0)

## 2019-01-16 DIAGNOSIS — R2681 Unsteadiness on feet: Secondary | ICD-10-CM | POA: Diagnosis not present

## 2019-01-16 DIAGNOSIS — M4324 Fusion of spine, thoracic region: Secondary | ICD-10-CM | POA: Diagnosis not present

## 2019-01-16 DIAGNOSIS — M4326 Fusion of spine, lumbar region: Secondary | ICD-10-CM | POA: Diagnosis not present

## 2019-01-16 DIAGNOSIS — G20 Parkinson's disease: Secondary | ICD-10-CM | POA: Diagnosis not present

## 2019-01-16 DIAGNOSIS — M4625 Osteomyelitis of vertebra, thoracolumbar region: Secondary | ICD-10-CM | POA: Diagnosis not present

## 2019-01-16 DIAGNOSIS — R278 Other lack of coordination: Secondary | ICD-10-CM | POA: Diagnosis not present

## 2019-01-16 DIAGNOSIS — M6281 Muscle weakness (generalized): Secondary | ICD-10-CM | POA: Diagnosis not present

## 2019-01-22 DIAGNOSIS — Z20828 Contact with and (suspected) exposure to other viral communicable diseases: Secondary | ICD-10-CM | POA: Diagnosis not present

## 2019-01-22 DIAGNOSIS — Z79899 Other long term (current) drug therapy: Secondary | ICD-10-CM | POA: Diagnosis not present

## 2019-01-22 DIAGNOSIS — I4891 Unspecified atrial fibrillation: Secondary | ICD-10-CM | POA: Diagnosis not present

## 2019-01-23 DIAGNOSIS — R2681 Unsteadiness on feet: Secondary | ICD-10-CM | POA: Diagnosis not present

## 2019-01-25 ENCOUNTER — Ambulatory Visit: Payer: Medicare Other | Admitting: Infectious Diseases

## 2019-02-05 DIAGNOSIS — I4891 Unspecified atrial fibrillation: Secondary | ICD-10-CM | POA: Diagnosis not present

## 2019-02-05 DIAGNOSIS — Z79899 Other long term (current) drug therapy: Secondary | ICD-10-CM | POA: Diagnosis not present

## 2019-02-06 DIAGNOSIS — Z20828 Contact with and (suspected) exposure to other viral communicable diseases: Secondary | ICD-10-CM | POA: Diagnosis not present

## 2019-02-07 DIAGNOSIS — M6281 Muscle weakness (generalized): Secondary | ICD-10-CM | POA: Diagnosis not present

## 2019-02-07 DIAGNOSIS — R278 Other lack of coordination: Secondary | ICD-10-CM | POA: Diagnosis not present

## 2019-02-07 DIAGNOSIS — R2681 Unsteadiness on feet: Secondary | ICD-10-CM | POA: Diagnosis not present

## 2019-02-07 DIAGNOSIS — M4625 Osteomyelitis of vertebra, thoracolumbar region: Secondary | ICD-10-CM | POA: Diagnosis not present

## 2019-02-07 DIAGNOSIS — G20 Parkinson's disease: Secondary | ICD-10-CM | POA: Diagnosis not present

## 2019-02-07 DIAGNOSIS — I5033 Acute on chronic diastolic (congestive) heart failure: Secondary | ICD-10-CM | POA: Diagnosis not present

## 2019-02-12 DIAGNOSIS — Z20828 Contact with and (suspected) exposure to other viral communicable diseases: Secondary | ICD-10-CM | POA: Diagnosis not present

## 2019-02-13 DIAGNOSIS — I4891 Unspecified atrial fibrillation: Secondary | ICD-10-CM | POA: Diagnosis not present

## 2019-02-13 DIAGNOSIS — Z79899 Other long term (current) drug therapy: Secondary | ICD-10-CM | POA: Diagnosis not present

## 2019-02-13 DIAGNOSIS — R6889 Other general symptoms and signs: Secondary | ICD-10-CM | POA: Diagnosis not present

## 2019-02-19 DIAGNOSIS — Z20828 Contact with and (suspected) exposure to other viral communicable diseases: Secondary | ICD-10-CM | POA: Diagnosis not present

## 2019-02-20 DIAGNOSIS — R6889 Other general symptoms and signs: Secondary | ICD-10-CM | POA: Diagnosis not present

## 2019-02-20 DIAGNOSIS — I4891 Unspecified atrial fibrillation: Secondary | ICD-10-CM | POA: Diagnosis not present

## 2019-02-20 DIAGNOSIS — Z79899 Other long term (current) drug therapy: Secondary | ICD-10-CM | POA: Diagnosis not present

## 2019-02-21 DIAGNOSIS — R300 Dysuria: Secondary | ICD-10-CM | POA: Diagnosis not present

## 2019-02-21 DIAGNOSIS — I4891 Unspecified atrial fibrillation: Secondary | ICD-10-CM | POA: Diagnosis not present

## 2019-02-21 DIAGNOSIS — R6889 Other general symptoms and signs: Secondary | ICD-10-CM | POA: Diagnosis not present

## 2019-02-21 DIAGNOSIS — N390 Urinary tract infection, site not specified: Secondary | ICD-10-CM | POA: Diagnosis not present

## 2019-02-21 DIAGNOSIS — Z79899 Other long term (current) drug therapy: Secondary | ICD-10-CM | POA: Diagnosis not present

## 2019-02-23 DIAGNOSIS — R2681 Unsteadiness on feet: Secondary | ICD-10-CM | POA: Diagnosis not present

## 2019-03-05 DIAGNOSIS — I4891 Unspecified atrial fibrillation: Secondary | ICD-10-CM | POA: Diagnosis not present

## 2019-03-05 DIAGNOSIS — R319 Hematuria, unspecified: Secondary | ICD-10-CM | POA: Diagnosis not present

## 2019-03-05 DIAGNOSIS — N390 Urinary tract infection, site not specified: Secondary | ICD-10-CM | POA: Diagnosis not present

## 2019-03-05 DIAGNOSIS — R300 Dysuria: Secondary | ICD-10-CM | POA: Diagnosis not present

## 2019-03-05 DIAGNOSIS — R6889 Other general symptoms and signs: Secondary | ICD-10-CM | POA: Diagnosis not present

## 2019-03-09 DIAGNOSIS — I130 Hypertensive heart and chronic kidney disease with heart failure and stage 1 through stage 4 chronic kidney disease, or unspecified chronic kidney disease: Secondary | ICD-10-CM | POA: Diagnosis not present

## 2019-03-09 DIAGNOSIS — M792 Neuralgia and neuritis, unspecified: Secondary | ICD-10-CM | POA: Diagnosis not present

## 2019-03-09 DIAGNOSIS — M4324 Fusion of spine, thoracic region: Secondary | ICD-10-CM | POA: Diagnosis not present

## 2019-03-09 DIAGNOSIS — M4326 Fusion of spine, lumbar region: Secondary | ICD-10-CM | POA: Diagnosis not present

## 2019-03-09 DIAGNOSIS — N183 Chronic kidney disease, stage 3 unspecified: Secondary | ICD-10-CM | POA: Diagnosis not present

## 2019-03-09 DIAGNOSIS — M519 Unspecified thoracic, thoracolumbar and lumbosacral intervertebral disc disorder: Secondary | ICD-10-CM | POA: Diagnosis not present

## 2019-03-09 DIAGNOSIS — M4645 Discitis, unspecified, thoracolumbar region: Secondary | ICD-10-CM | POA: Diagnosis not present

## 2019-03-09 DIAGNOSIS — M48061 Spinal stenosis, lumbar region without neurogenic claudication: Secondary | ICD-10-CM | POA: Diagnosis not present

## 2019-03-09 DIAGNOSIS — M4625 Osteomyelitis of vertebra, thoracolumbar region: Secondary | ICD-10-CM | POA: Diagnosis not present

## 2019-03-09 DIAGNOSIS — G20 Parkinson's disease: Secondary | ICD-10-CM | POA: Diagnosis not present

## 2019-03-09 DIAGNOSIS — F0280 Dementia in other diseases classified elsewhere without behavioral disturbance: Secondary | ICD-10-CM | POA: Diagnosis not present

## 2019-03-09 DIAGNOSIS — B965 Pseudomonas (aeruginosa) (mallei) (pseudomallei) as the cause of diseases classified elsewhere: Secondary | ICD-10-CM | POA: Diagnosis not present

## 2019-03-09 DIAGNOSIS — M4726 Other spondylosis with radiculopathy, lumbar region: Secondary | ICD-10-CM | POA: Diagnosis not present

## 2019-03-09 DIAGNOSIS — I480 Paroxysmal atrial fibrillation: Secondary | ICD-10-CM | POA: Diagnosis not present

## 2019-03-09 DIAGNOSIS — I5033 Acute on chronic diastolic (congestive) heart failure: Secondary | ICD-10-CM | POA: Diagnosis not present

## 2019-03-09 DIAGNOSIS — D631 Anemia in chronic kidney disease: Secondary | ICD-10-CM | POA: Diagnosis not present

## 2019-03-11 ENCOUNTER — Encounter: Payer: Self-pay | Admitting: Infectious Diseases

## 2019-03-13 DIAGNOSIS — M47816 Spondylosis without myelopathy or radiculopathy, lumbar region: Secondary | ICD-10-CM | POA: Diagnosis not present

## 2019-03-13 DIAGNOSIS — G20 Parkinson's disease: Secondary | ICD-10-CM | POA: Diagnosis not present

## 2019-03-13 DIAGNOSIS — I4891 Unspecified atrial fibrillation: Secondary | ICD-10-CM | POA: Diagnosis not present

## 2019-03-13 DIAGNOSIS — I129 Hypertensive chronic kidney disease with stage 1 through stage 4 chronic kidney disease, or unspecified chronic kidney disease: Secondary | ICD-10-CM | POA: Diagnosis not present

## 2019-03-16 ENCOUNTER — Other Ambulatory Visit: Payer: Self-pay

## 2019-03-16 ENCOUNTER — Ambulatory Visit: Payer: Medicare Other | Admitting: *Deleted

## 2019-03-16 DIAGNOSIS — Z5181 Encounter for therapeutic drug level monitoring: Secondary | ICD-10-CM | POA: Diagnosis not present

## 2019-03-16 DIAGNOSIS — I4891 Unspecified atrial fibrillation: Secondary | ICD-10-CM

## 2019-03-16 LAB — POCT INR: INR: 4.4 — AB (ref 2.0–3.0)

## 2019-03-16 MED ORDER — WARFARIN SODIUM 2.5 MG PO TABS: ORAL_TABLET | ORAL | 3 refills | Status: DC

## 2019-03-16 NOTE — Patient Instructions (Addendum)
Description   Go back to your old tablet strength (2.5mg  GREEN tablets). Skip your Coumadin for 2 days, then resume old dose of 1 tablet daily except 1/2 tablet on Tuesdays and Thursdays. Recheck INR in 1 weeks.  Call Coumadin Clinic for medication changes. [phone].

## 2019-03-17 ENCOUNTER — Other Ambulatory Visit: Payer: Self-pay | Admitting: Cardiovascular Disease

## 2019-03-19 DIAGNOSIS — H5010 Unspecified exotropia: Secondary | ICD-10-CM | POA: Diagnosis not present

## 2019-03-19 DIAGNOSIS — H401131 Primary open-angle glaucoma, bilateral, mild stage: Secondary | ICD-10-CM | POA: Diagnosis not present

## 2019-03-19 DIAGNOSIS — H04123 Dry eye syndrome of bilateral lacrimal glands: Secondary | ICD-10-CM | POA: Diagnosis not present

## 2019-03-19 DIAGNOSIS — H532 Diplopia: Secondary | ICD-10-CM | POA: Diagnosis not present

## 2019-03-20 MED ORDER — METOPROLOL SUCCINATE ER 50 MG PO TB24: ORAL_TABLET | ORAL | 2 refills | Status: DC

## 2019-03-23 ENCOUNTER — Ambulatory Visit: Payer: Medicare Other | Admitting: *Deleted

## 2019-03-23 ENCOUNTER — Other Ambulatory Visit: Payer: Self-pay

## 2019-03-23 DIAGNOSIS — Z5181 Encounter for therapeutic drug level monitoring: Secondary | ICD-10-CM

## 2019-03-23 DIAGNOSIS — I4891 Unspecified atrial fibrillation: Secondary | ICD-10-CM | POA: Diagnosis not present

## 2019-03-23 LAB — POCT INR: INR: 2.4 (ref 2.0–3.0)

## 2019-03-23 NOTE — Patient Instructions (Addendum)
Description   Go back to your old tablet strength (2.5mg  GREEN tablets). Continue to take 1 tablet daily except 1/2 tablet on Tuesdays and Thursdays. Recheck INR in 2 weeks.  Call Coumadin Clinic for medication changes. [phone].

## 2019-03-25 DIAGNOSIS — R2681 Unsteadiness on feet: Secondary | ICD-10-CM | POA: Diagnosis not present

## 2019-03-26 NOTE — Progress Notes (Signed)
[name] was seen today in follow up for atypical parkinsonism.  She was worked in today for an appointment.  She is accompanied by her daughter who supplements the history.  Patient's levodopa was decreased last visit just a little bit so that she was taking carbidopa/levodopa 25/100, 1.5 tablets at 7 AM/11 AM/4 PM.  It turns out the nursing facility never did this and she is still on 2 tablet 3 times per day, but her family is now administering this and are giving the last dosage at bedtime.  They did not know otherwise.  I decreased her pramipexole from 1 mg at night for restless leg to 0.5 mg at bedtime.  When I last saw her, she was in rehab for discitis and subsequently was having some confusion, agitation and hallucinations.  I thought this was a combination of abnormal environment along with her multiple medication changes.  She was also getting Ultram as needed.  She also had a great increase in her gabapentin which I thought was contributing.  She went from 300 mg at bedtime to 600 mg twice per day.  I recommended that this medication be looked at it is a source of confusion and hallucinations.  daughter states that it was decreased to 300 mg bid. I have reviewed records since our last visit.  She is back in the hospital at the end of June.  She ended up having more pain and had to go back to the OR on December 13, 2018 for T11/T12 laminectomies, right T12/L1 facetectomy, fusion of the T9-L3 levels.  She last followed up with infectious disease on January 11, 2019.  Those records have been reviewed.  She has been out of rehab now for 3 weeks.  Daughter states that she has a little bit of confusion last week but she has had a URI but tx with mucinex has helped.  She has had no hallucinations since home for the last 3 weeks.  Getting around with walker and WC.  Has a little diplopia.  Saw eye doctor last week.  Caregivers are at the home 24 hours per day.  meds are given by daughters.  Driving is  done by daughters.      ALLERGIES:   Allergies  Allergen Reactions   Lyrica [Pregabalin] Other (See Comments)    Felt loopy   Zocor [Simvastatin] Other (See Comments)    Myalgias   Relafen [Nabumetone] Rash    CURRENT MEDICATIONS:  Outpatient Encounter Medications as of 03/27/2019  Medication Sig   acetaminophen (TYLENOL) 325 MG tablet Take 650 mg by mouth every 6 (six) hours as needed.   calcium carbonate (TUMS - DOSED IN MG ELEMENTAL CALCIUM) 500 MG chewable tablet Chew 1 tablet by mouth 2 (two) times daily.    carbidopa-levodopa (SINEMET IR) 25-100 MG tablet Take 2 tablets by mouth 3 (three) times daily.   Cholecalciferol (HM VITAMIN D3) 100 MCG (4000 UT) CAPS Take 8,000 Units by mouth daily.   doxycycline (VIBRA-TABS) 100 MG tablet Take 1 tablet (100 mg total) by mouth 2 (two) times daily.   furosemide (LASIX) 20 MG tablet Take 1 tablet (20 mg total) by mouth daily.   gabapentin (NEURONTIN) 600 MG tablet Take 600 mg by mouth 2 (two) times daily. Thinks 300mg  twice a day now   liver oil-zinc oxide (DESITIN) 40 % ointment Apply 1 application topically as needed for irritation.   Melatonin 10 MG TABS Take 10 mg by mouth at bedtime.  metoprolol succinate (TOPROL-XL) 50 MG 24 hr tablet Take 1/2 tablet by mouth daily.Take with or immediately following a meal. (Patient taking differently: Take 1/2 tablet by mouth daily.Take with or immediately following a meal. Dose lowered now taking 12.5 mg daily)   nystatin (MYCOSTATIN/NYSTOP) powder Apply 1 g topically 3 (three) times daily.   pantoprazole (PROTONIX) 40 MG tablet Take 40 mg by mouth 2 (two) times daily.    polyethylene glycol (MIRALAX / GLYCOLAX) packet Take 17 g by mouth daily.   potassium chloride SA (K-DUR) 20 MEQ tablet Take 1 tablet (20 mEq total) by mouth daily.   pramipexole (MIRAPEX) 1 MG tablet Take 0.5 mg by mouth at bedtime.    pravastatin (PRAVACHOL) 40 MG tablet Take 40 mg by mouth at bedtime.     TRAVATAN Z 0.004 % SOLN ophthalmic solution Place 1 drop into both eyes at bedtime.    Triamcinolone Acetonide (TRIAMCINOLONE 0.1 % CREAM : EUCERIN) CREA Apply 1 application topically 2 (two) times daily.   warfarin (COUMADIN) 2.5 MG tablet Take 1/2 to 1 tablet daily as directed by Coumadin clinic   flecainide (TAMBOCOR) 50 MG tablet Take 1 tablet (50 mg total) by mouth 2 (two) times daily.   HYDROcodone-acetaminophen (NORCO/VICODIN) 5-325 MG tablet Take 1 tablet by mouth every 6 (six) hours as needed for moderate pain.   No facility-administered encounter medications on file as of 03/27/2019.     PAST MEDICAL HISTORY:   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   GERD (gastroesophageal reflux disease)    Hyperlipidemia    Hypertension    Lumbar stenosis    OSA (obstructive sleep apnea)    "couldn't handle CPAP; use mouth guard some; not all the time" (01/06/2014)   PAF (paroxysmal atrial fibrillation) ([place])    Scoliosis    with radiculopathy L2-S1 with prior surgery   Small bowel obstruction ([place])    versus ileus after last bck surgery   Spondylosis     PAST SURGICAL HISTORY:   Past Surgical History:  Procedure Laterality Date   BACK SURGERY     CATARACT EXTRACTION W/ INTRAOCULAR LENS  IMPLANT, BILATERAL  2011   CHOLECYSTECTOMY N/A 07/07/2018   Procedure: LAPAROSCOPIC CHOLECYSTECTOMY;  Surgeon: Erroll Luna, MD;  Location: [facility];  Service: General;  Laterality: N/A;   COLONOSCOPY  12/09/2004   LIPOMA EXCISION  1980's   "fatty tumors"   [place]  02/2009   "ruptured disc"   NASAL SEPTUM SURGERY  80's   POSTERIOR LUMBAR FUSION  06/2010; 10/2011   "placed screws, rods, spacers both times"   POSTERIOR LUMBAR FUSION 4 LEVEL N/A 12/13/2018   Procedure: THORACIC NINE -LUMBAR  THREE POSTERIOR INSTRUMENTATION FUSION;  Surgeon: Judith Part, MD;  Location: [facility];  Service: Neurosurgery;  Laterality: N/A;   REPAIR DURAL / CSF LEAK  02/2009   THORACIC LAMINECTOMY FOR EPIDURAL ABSCESS N/A 09/28/2018   Procedure: THORACIC NINE-THORACIC TEN, THORACIC TEN-THORACIC ELEVEN, THORACIC ELEVEN-THORACIC TWELVE LAMINECTOMIES FOR EPIDURAL ABSCESS;  Surgeon: Judith Part, MD;  Location: [facility];  Service: Neurosurgery;  Laterality: N/A;   TOTAL ABDOMINAL HYSTERECTOMY  02/1993    SOCIAL HISTORY:   Social History   Socioeconomic History   Marital status: Married    Spouse name: [name]  Number of children: 2   Years of education: 12th    Highest education level: Not on file  Occupational History   Occupation: retired    Fish farm manager: RETIRED    Comment: clerical office work  Scientist, product/process development strain: Not on file   Food insecurity    Worry: Not on file    Inability: Not on Lexicographer needs    Medical: Not on file    Non-medical: Not on file  Tobacco Use   Smoking status: Never Smoker   Smokeless tobacco: Never Used  Substance and Sexual Activity   Alcohol use: No    Alcohol/week: 0.0 standard drinks    Comment: 02/01/2014 " glasseof wine once in a blue moon; < once/year"   Drug use: No   Sexual activity: Never    Comment: hysterectomy  Lifestyle   Physical activity    Days per week: Not on file    Minutes per session: Not on file   Stress: Not on file  Relationships   Social connections    Talks on phone: Not on file    Gets together: Not on file    Attends religious service: Not on file    Active member of club or organization: Not on file    Attends meetings of clubs or organizations: Not on file    Relationship status: Not on file   Intimate partner violence    Fear of current or ex partner: Not on file    Emotionally abused: Not on file    Physically abused: Not on file    Forced sexual activity: Not on  file  Other Topics Concern   Not on file  Social History Narrative   Patient lives at home with husband [name].   Patient is retired.   Patient has a high school education.    Patient has 2 children     FAMILY HISTORY:   Family Status  Relation Name Status   Mother  Deceased at age 78       natural causes   Father  Deceased at age 7       MI   MGM  Deceased   MGF  Deceased   PGM  Deceased   PGF  Deceased   Sister  Deceased   Sister  (Not Specified)   Sister  (Not Specified)   Daughter 2 Alive    ROS:  ROS  PHYSICAL EXAMINATION:    VITALS:   Vitals:   03/27/19 1045  BP: (!) 101/56  Pulse: 83  Temp: (!) 97.5 F (36.4 C)  SpO2: 99%  Weight: 164 lb (74.4 kg)  Height: 5\' 5"  (1.651 m)    GEN:  The patient appears stated age and is in NAD. HEENT:  Normocephalic, atraumatic.  The mucous membranes are moist. The superficial temporal arteries are without ropiness or tenderness. CV:  RRR Lungs:  CTAB Neck/HEME:  There are no carotid bruits bilaterally.  Neurological examination:  Orientation: The patient is alert and oriented x3. Cranial nerves: There is good facial symmetry with facial hypomimia. The speech is fluent and clear. Soft palate rises symmetrically and there is no tongue deviation. Hearing is intact to conversational tone. Sensation: Sensation is intact to light touch throughout Motor: Strength is at least antigravity x4.  There is foot drop on the L  Movement examination: Tone: There is mod increased tone on the LUE Abnormal movements: none Coordination:  There is decremation with RAM's, with any  form of RAMS, including alternating supination and pronation of the forearm, hand opening and closing, finger taps, heel taps and toe taps bilaterally.  There is foot drop on the L so unable to do toe taps on the L Gait and Station: The patient has  difficulty arising out of a deep-seated chair without the use of the hands. She pushes off of the chair  and just needs a little bit of help.  She has a stooped posture.  The L foot drop impedes the ambulation.     ASSESSMENT/PLAN:  1.  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  Patient has had a square wave jerks, diplopia within the first 3 years of onset and mild downgaze paresis.             -Levodopa challenge test was completed and levodopa was only of mild benefit.  However, the patient thinks it is helpful             -She will continue carbidopa/levodopa 25/100, 2 tablets 3 times per day, but talked about proper dosing, at 8 AM/noon/4 PM.  Family is now administering it to her.  She has 24/day caregivers in the home as well.  Talked about interactions with protein.  If she has more confusion/hallucinations in the future, we may just decrease it.  Urinary is in the patient is still on it is she thinks it is beneficial.              -She will continue pramipexole 0.5 mg at night for restless leg.  2.  Low back pain with history of discitis             -Has had multiple surgeries due to infection.  She is actually doing much better and was clear mentally today.  Discussed the importance of regular daily schedule.  Discussed that she needs to stay awake in the day and be asleep at night.  She is no longer on any pain medications.  Her gabapentin has been decreased to 300 mg twice per day.  Discussed that if she is doing well, she may want to talk to her doctors about tapering this as well.  3. Follow up is anticipated in the next 4-6 months, sooner should new neurologic issues arise.  Much greater than 50% of this visit was spent in counseling and coordinating care.  Total face to face time:  30 min.   This did not include the 30 min of record review which was detailed above, which was non face to face time.  Cc:  [name], MD

## 2019-03-27 ENCOUNTER — Encounter: Payer: Self-pay | Admitting: Neurology

## 2019-03-27 ENCOUNTER — Other Ambulatory Visit: Payer: Self-pay

## 2019-03-27 ENCOUNTER — Ambulatory Visit: Payer: Medicare Other | Admitting: Neurology

## 2019-03-27 VITALS — BP 101/56 | HR 83 | Temp 97.5°F | Ht 65.0 in | Wt 164.0 lb

## 2019-03-27 DIAGNOSIS — G20 Parkinson's disease: Secondary | ICD-10-CM

## 2019-03-27 DIAGNOSIS — M4620 Osteomyelitis of vertebra, site unspecified: Secondary | ICD-10-CM | POA: Diagnosis not present

## 2019-03-27 DIAGNOSIS — M4640 Discitis, unspecified, site unspecified: Secondary | ICD-10-CM

## 2019-03-27 NOTE — Patient Instructions (Signed)
1.  Create a schedule 2.  Challenge your brain! 3.  Exercise daily 4.  Your carbidopa/levodopa 25/100 should be taken 2 tablets at 8am/noon/4pm and you can take it on an empty stomach or with carbohydrate.  Keep it away from protein.  The physicians and staff at [facility] are committed to providing excellent care. You may receive a survey requesting feedback about your experience at our office. We strive to receive "very good" responses to the survey questions. If you feel that your experience would prevent you from giving the office a "very good " response, please contact our office to try to remedy the situation. We may be reached at [phone]. Thank you for taking the time out of your busy day to complete the survey.

## 2019-03-29 ENCOUNTER — Other Ambulatory Visit: Payer: Self-pay

## 2019-03-29 MED ORDER — CARBIDOPA-LEVODOPA 25-100 MG PO TABS: 2.0000 | ORAL_TABLET | Freq: Three times a day (TID) | ORAL | 0 refills | Status: DC

## 2019-03-30 ENCOUNTER — Telehealth: Payer: Self-pay | Admitting: Neurology

## 2019-03-30 NOTE — Telephone Encounter (Signed)
Daughter left msg with after hours about patient being in office other day and asked about needing another prescription. She does. She contacted the pharm and they have sent fax to office but no one has contacted them. Medication: Carbidopa levodopa to [facility]. Few symptoms of her head moving, mouth opening, and arms and legs drawing up.

## 2019-03-30 NOTE — Telephone Encounter (Signed)
Pt notified to contact pharmacy

## 2019-03-30 NOTE — Telephone Encounter (Signed)
Looks like that med was filled by [place] yesterday.  Have them check back with the pharmacy

## 2019-04-02 ENCOUNTER — Other Ambulatory Visit: Payer: Self-pay | Admitting: Neurology

## 2019-04-02 MED ORDER — CARBIDOPA-LEVODOPA 25-100 MG PO TABS: 2.0000 | ORAL_TABLET | Freq: Three times a day (TID) | ORAL | 1 refills | Status: DC

## 2019-04-05 ENCOUNTER — Ambulatory Visit (INDEPENDENT_AMBULATORY_CARE_PROVIDER_SITE_OTHER): Payer: Medicare Other | Admitting: Cardiology

## 2019-04-05 DIAGNOSIS — I4891 Unspecified atrial fibrillation: Secondary | ICD-10-CM

## 2019-04-05 DIAGNOSIS — Z5181 Encounter for therapeutic drug level monitoring: Secondary | ICD-10-CM | POA: Diagnosis not present

## 2019-04-05 LAB — POCT INR: INR: 1.4 — AB (ref 2.0–3.0)

## 2019-04-06 DIAGNOSIS — M21372 Foot drop, left foot: Secondary | ICD-10-CM | POA: Diagnosis not present

## 2019-04-09 DIAGNOSIS — D631 Anemia in chronic kidney disease: Secondary | ICD-10-CM | POA: Diagnosis not present

## 2019-04-09 DIAGNOSIS — M792 Neuralgia and neuritis, unspecified: Secondary | ICD-10-CM | POA: Diagnosis not present

## 2019-04-09 DIAGNOSIS — M4625 Osteomyelitis of vertebra, thoracolumbar region: Secondary | ICD-10-CM | POA: Diagnosis not present

## 2019-04-09 DIAGNOSIS — I480 Paroxysmal atrial fibrillation: Secondary | ICD-10-CM | POA: Diagnosis not present

## 2019-04-09 DIAGNOSIS — M4326 Fusion of spine, lumbar region: Secondary | ICD-10-CM | POA: Diagnosis not present

## 2019-04-09 DIAGNOSIS — M519 Unspecified thoracic, thoracolumbar and lumbosacral intervertebral disc disorder: Secondary | ICD-10-CM | POA: Diagnosis not present

## 2019-04-09 DIAGNOSIS — I130 Hypertensive heart and chronic kidney disease with heart failure and stage 1 through stage 4 chronic kidney disease, or unspecified chronic kidney disease: Secondary | ICD-10-CM | POA: Diagnosis not present

## 2019-04-09 DIAGNOSIS — M4645 Discitis, unspecified, thoracolumbar region: Secondary | ICD-10-CM | POA: Diagnosis not present

## 2019-04-09 DIAGNOSIS — G20 Parkinson's disease: Secondary | ICD-10-CM | POA: Diagnosis not present

## 2019-04-09 DIAGNOSIS — I5033 Acute on chronic diastolic (congestive) heart failure: Secondary | ICD-10-CM | POA: Diagnosis not present

## 2019-04-09 DIAGNOSIS — M4324 Fusion of spine, thoracic region: Secondary | ICD-10-CM | POA: Diagnosis not present

## 2019-04-09 DIAGNOSIS — N183 Chronic kidney disease, stage 3 unspecified: Secondary | ICD-10-CM | POA: Diagnosis not present

## 2019-04-09 DIAGNOSIS — F0280 Dementia in other diseases classified elsewhere without behavioral disturbance: Secondary | ICD-10-CM | POA: Diagnosis not present

## 2019-04-09 DIAGNOSIS — B965 Pseudomonas (aeruginosa) (mallei) (pseudomallei) as the cause of diseases classified elsewhere: Secondary | ICD-10-CM | POA: Diagnosis not present

## 2019-04-09 DIAGNOSIS — M48061 Spinal stenosis, lumbar region without neurogenic claudication: Secondary | ICD-10-CM | POA: Diagnosis not present

## 2019-04-09 DIAGNOSIS — M4726 Other spondylosis with radiculopathy, lumbar region: Secondary | ICD-10-CM | POA: Diagnosis not present

## 2019-04-11 ENCOUNTER — Telehealth: Payer: Self-pay | Admitting: Pharmacist

## 2019-04-11 NOTE — Telephone Encounter (Signed)
Pt in the black book for INR follow up.

## 2019-04-11 NOTE — Telephone Encounter (Signed)
INR done last week (October/29). RN never received order for dosing or repeat INR.  Noted call placed to wrong phone number. RN call back number is [phone].  I gave order to repeat INR tomorrow and call results to [phone]

## 2019-04-12 ENCOUNTER — Ambulatory Visit (INDEPENDENT_AMBULATORY_CARE_PROVIDER_SITE_OTHER): Payer: Medicare Other | Admitting: Internal Medicine

## 2019-04-12 DIAGNOSIS — I4891 Unspecified atrial fibrillation: Secondary | ICD-10-CM | POA: Diagnosis not present

## 2019-04-12 DIAGNOSIS — Z5181 Encounter for therapeutic drug level monitoring: Secondary | ICD-10-CM | POA: Diagnosis not present

## 2019-04-12 LAB — POCT INR: INR: 2.2 (ref 2.0–3.0)

## 2019-04-12 NOTE — Patient Instructions (Signed)
Description    Called and spoke to [place] from [facility] while she was with pt. Instructed for pt to continue to take 1 tablet daily except 1/2 tablet on Tuesdays and Thursdays. Recheck INR in 2 weeks.  Call [facility] for medication changes. [phone].

## 2019-04-16 ENCOUNTER — Other Ambulatory Visit: Payer: Self-pay | Admitting: Family Medicine

## 2019-04-16 ENCOUNTER — Ambulatory Visit
Admission: RE | Admit: 2019-04-16 | Discharge: 2019-04-16 | Disposition: A | Discharge status: Home / Self Care | Payer: Medicare Other | Source: Ambulatory Visit | Attending: Family Medicine | Admitting: Family Medicine

## 2019-04-16 DIAGNOSIS — M25559 Pain in unspecified hip: Secondary | ICD-10-CM

## 2019-04-16 DIAGNOSIS — M25551 Pain in right hip: Secondary | ICD-10-CM | POA: Diagnosis not present

## 2019-04-16 DIAGNOSIS — M79604 Pain in right leg: Secondary | ICD-10-CM | POA: Diagnosis not present

## 2019-04-16 DIAGNOSIS — G2581 Restless legs syndrome: Secondary | ICD-10-CM | POA: Diagnosis not present

## 2019-04-20 DIAGNOSIS — Z1231 Encounter for screening mammogram for malignant neoplasm of breast: Secondary | ICD-10-CM | POA: Diagnosis not present

## 2019-04-23 ENCOUNTER — Other Ambulatory Visit: Payer: Self-pay

## 2019-04-23 ENCOUNTER — Encounter: Payer: Self-pay | Admitting: Internal Medicine

## 2019-04-23 ENCOUNTER — Ambulatory Visit: Payer: Medicare Other | Admitting: Internal Medicine

## 2019-04-23 DIAGNOSIS — I4891 Unspecified atrial fibrillation: Secondary | ICD-10-CM | POA: Diagnosis not present

## 2019-04-23 DIAGNOSIS — G4733 Obstructive sleep apnea (adult) (pediatric): Secondary | ICD-10-CM | POA: Diagnosis not present

## 2019-04-23 NOTE — Assessment & Plan Note (Signed)
Rhythm is very regular on this exam and may be NSR. Followed by cardiology

## 2019-04-23 NOTE — Assessment & Plan Note (Signed)
Insignificant symptoms now by report. Appropriate to help her again in the future if needed.

## 2019-04-23 NOTE — Progress Notes (Signed)
Patient ID: [name], female    DOB: [birth year], 78 y.o.   MRN: [id number]  HPI F never smoker followed for allergic rhinitis, hx sleep apnea/ failed oral appliance, complicated by AtrialFib, parkinsonism, glaucoma, NPSG 01/25/08- AHI 6/ hr Oral appliance from Dr. Ron Parker- Quit  -----------------------------------------------------------------------------------  05/04/17- 78 year old female never smoker followed for Allergic rhinitis, History OSA/Oral Appliance, complicated by Atrial fib/warfarin, Parkinsonism, Glaucoma Cough; Pt states she had been doing well until Sunday-cough-worse at night, itchy throat. Cough is productive-yellow in color. Denies any fever or chills.  Acute illness times 3 days-significant fatigue, sputum getting darker.  Denies blood, high fever, hard chills, adenopathy or GI upset. She quit oral appliance for OSA because it was uncomfortable.  Previously had failed to tolerate CPAP. CXR 04/21/16-NAD  04/23/2019- 78 year old female never smoker followed for Allergic rhinitis, History OSA/ Quit Oral Appliance, complicated by Atrial fib/warfarin, Parkinsonism, Glaucoma, Cough; Hosp and ER this year for degen disc disease/ back pain, complicated by Thoracic epidural abscess/ laminectomy. She had been uncomforrtable with oral appliance and since quitting it husband told her not snoring much and denies daytime sleepiness.  Body weight today 170 lbs Here today with daughter and another dtr on speaker phone. Daytime breathing is fine. Mild morning cough. Has had flu vax CXR 12/04/2018-  1. No active cardiopulmonary disease. 2. Aortic atherosclerosis.  Review of Systems- see HPI + = positive Constitutional:   No-   weight loss, night sweats, fevers, chills, fatigue, lassitude. HEENT:   No-  headaches, difficulty swallowing, tooth/dental problems, sore throat,       No-  sneezing, itching, ear ache, nasal congestion, post nasal drip,  CV:  No-   chest pain,  orthopnea, PND, swelling in lower extremities, anasarca, dizziness, palpitations Resp: No-   shortness of breath with exertion or at rest.            +  productive cough,  No non-productive cough,  No- coughing up of blood.              No-   change in color of mucus.  No- wheezing.   Skin: No-   rash or lesions. GI:  No-   heartburn, indigestion, abdominal pain, nausea, vomiting,  GU:  MS:  No-   joint pain or swelling.   Neuro-     +Parkinson's Psych:  No- change in mood or affect. No depression or anxiety.  No memory loss.  Objective:   Physical Exam General- Alert, Oriented, Affect-appropriate, Distress- none acute.  + walker Skin- rash-none, lesions- none, excoriation- none Lymphadenopathy- none Head- atraumatic            Eyes- Gross vision intact, PERRLA, conjunctivae clear secretions            Ears- Hearing, canals-normal            Nose- Clear, no-Septal dev, mucus, polyps, erosion, perforation             Throat- Mallampati II-III , mucosa clear , drainage- none, tonsils- atrophic Neck- flexible , trachea midline, no stridor , thyroid nl, carotid no bruit Chest - symmetrical excursion , unlabored           Heart/CV- RR or nearly so/ no pacer , no murmur , no gallop  , no rub, nl s1 s2                           - JVD- none , edema-  none, stasis changes- none, varices- none           Lung-  wheeze- none, cough- none , dullness-none, rub- none           Chest wall-  Abd-  Br/ Gen/ Rectal- Not done, not indicated Extrem- cyanosis- none, clubbing, none, atrophy- none, strength- nl. + Rolling walker Neuro- +flat facies, speech a little slow, rolling walker/ wheelchair           Is

## 2019-04-23 NOTE — Patient Instructions (Signed)
I hope this next year is better for you.  Please call if we can help.

## 2019-04-25 DIAGNOSIS — R2681 Unsteadiness on feet: Secondary | ICD-10-CM | POA: Diagnosis not present

## 2019-04-25 DIAGNOSIS — R350 Frequency of micturition: Secondary | ICD-10-CM | POA: Diagnosis not present

## 2019-04-26 ENCOUNTER — Ambulatory Visit (INDEPENDENT_AMBULATORY_CARE_PROVIDER_SITE_OTHER): Payer: Medicare Other | Admitting: *Deleted

## 2019-04-26 DIAGNOSIS — I4891 Unspecified atrial fibrillation: Secondary | ICD-10-CM

## 2019-04-26 DIAGNOSIS — Z5181 Encounter for therapeutic drug level monitoring: Secondary | ICD-10-CM

## 2019-04-26 LAB — POCT INR: INR: 1.8 — AB (ref 2.0–3.0)

## 2019-05-07 ENCOUNTER — Ambulatory Visit: Payer: Medicare Other | Admitting: Internal Medicine

## 2019-05-09 DIAGNOSIS — I480 Paroxysmal atrial fibrillation: Secondary | ICD-10-CM | POA: Diagnosis not present

## 2019-05-09 DIAGNOSIS — I130 Hypertensive heart and chronic kidney disease with heart failure and stage 1 through stage 4 chronic kidney disease, or unspecified chronic kidney disease: Secondary | ICD-10-CM | POA: Diagnosis not present

## 2019-05-09 DIAGNOSIS — F0280 Dementia in other diseases classified elsewhere without behavioral disturbance: Secondary | ICD-10-CM | POA: Diagnosis not present

## 2019-05-09 DIAGNOSIS — M48061 Spinal stenosis, lumbar region without neurogenic claudication: Secondary | ICD-10-CM | POA: Diagnosis not present

## 2019-05-09 DIAGNOSIS — B965 Pseudomonas (aeruginosa) (mallei) (pseudomallei) as the cause of diseases classified elsewhere: Secondary | ICD-10-CM | POA: Diagnosis not present

## 2019-05-09 DIAGNOSIS — M4726 Other spondylosis with radiculopathy, lumbar region: Secondary | ICD-10-CM | POA: Diagnosis not present

## 2019-05-09 DIAGNOSIS — I5033 Acute on chronic diastolic (congestive) heart failure: Secondary | ICD-10-CM | POA: Diagnosis not present

## 2019-05-09 DIAGNOSIS — D631 Anemia in chronic kidney disease: Secondary | ICD-10-CM | POA: Diagnosis not present

## 2019-05-09 DIAGNOSIS — M4645 Discitis, unspecified, thoracolumbar region: Secondary | ICD-10-CM | POA: Diagnosis not present

## 2019-05-09 DIAGNOSIS — M4324 Fusion of spine, thoracic region: Secondary | ICD-10-CM | POA: Diagnosis not present

## 2019-05-09 DIAGNOSIS — M4625 Osteomyelitis of vertebra, thoracolumbar region: Secondary | ICD-10-CM | POA: Diagnosis not present

## 2019-05-09 DIAGNOSIS — N183 Chronic kidney disease, stage 3 unspecified: Secondary | ICD-10-CM | POA: Diagnosis not present

## 2019-05-09 DIAGNOSIS — M4326 Fusion of spine, lumbar region: Secondary | ICD-10-CM | POA: Diagnosis not present

## 2019-05-09 DIAGNOSIS — G20 Parkinson's disease: Secondary | ICD-10-CM | POA: Diagnosis not present

## 2019-05-09 DIAGNOSIS — M792 Neuralgia and neuritis, unspecified: Secondary | ICD-10-CM | POA: Diagnosis not present

## 2019-05-09 DIAGNOSIS — M519 Unspecified thoracic, thoracolumbar and lumbosacral intervertebral disc disorder: Secondary | ICD-10-CM | POA: Diagnosis not present

## 2019-05-11 ENCOUNTER — Ambulatory Visit (INDEPENDENT_AMBULATORY_CARE_PROVIDER_SITE_OTHER): Payer: Medicare Other | Admitting: *Deleted

## 2019-05-11 DIAGNOSIS — I4891 Unspecified atrial fibrillation: Secondary | ICD-10-CM | POA: Diagnosis not present

## 2019-05-11 DIAGNOSIS — Z5181 Encounter for therapeutic drug level monitoring: Secondary | ICD-10-CM

## 2019-05-11 LAB — POCT INR: INR: 2 (ref 2.0–3.0)

## 2019-05-11 NOTE — Patient Instructions (Signed)
Description   Spoke to [facility] RN from [facility] while she was with pt and instructed for pt to take 1.5 tablets today then continue taking 1 tablet today then continue taking 1 tablet daily except 1/2 tablet on Tuesdays and Thursdays. Recheck INR in 3 weeks.  Call [facility]madin Clinic for medication changes. [phone].

## 2019-05-14 DIAGNOSIS — H00011 Hordeolum externum right upper eyelid: Secondary | ICD-10-CM | POA: Diagnosis not present

## 2019-05-14 DIAGNOSIS — H401131 Primary open-angle glaucoma, bilateral, mild stage: Secondary | ICD-10-CM | POA: Diagnosis not present

## 2019-05-14 DIAGNOSIS — H532 Diplopia: Secondary | ICD-10-CM | POA: Diagnosis not present

## 2019-05-14 DIAGNOSIS — H04123 Dry eye syndrome of bilateral lacrimal glands: Secondary | ICD-10-CM | POA: Diagnosis not present

## 2019-05-25 DIAGNOSIS — R2681 Unsteadiness on feet: Secondary | ICD-10-CM | POA: Diagnosis not present

## 2019-05-29 ENCOUNTER — Other Ambulatory Visit: Payer: Self-pay | Admitting: *Deleted

## 2019-05-29 ENCOUNTER — Other Ambulatory Visit: Payer: Self-pay

## 2019-05-29 ENCOUNTER — Ambulatory Visit (INDEPENDENT_AMBULATORY_CARE_PROVIDER_SITE_OTHER): Payer: Medicare Other | Admitting: *Deleted

## 2019-05-29 DIAGNOSIS — I4891 Unspecified atrial fibrillation: Secondary | ICD-10-CM

## 2019-05-29 DIAGNOSIS — Z5181 Encounter for therapeutic drug level monitoring: Secondary | ICD-10-CM

## 2019-05-29 LAB — POCT INR: INR: 2 (ref 2.0–3.0)

## 2019-05-29 MED ORDER — FLECAINIDE ACETATE 50 MG PO TABS: 50.0000 mg | ORAL_TABLET | Freq: Two times a day (BID) | ORAL | 0 refills | Status: DC

## 2019-05-29 NOTE — Patient Instructions (Addendum)
Description   Continue taking 1 tablet daily except 1/2 tablet on Tuesdays and Thursdays. Recheck INR in 4 weeks. Call [facility] Clinic for medication changes. [phone].

## 2019-06-07 DIAGNOSIS — M48061 Spinal stenosis, lumbar region without neurogenic claudication: Secondary | ICD-10-CM | POA: Diagnosis not present

## 2019-06-07 DIAGNOSIS — B965 Pseudomonas (aeruginosa) (mallei) (pseudomallei) as the cause of diseases classified elsewhere: Secondary | ICD-10-CM | POA: Diagnosis not present

## 2019-06-07 DIAGNOSIS — G20 Parkinson's disease: Secondary | ICD-10-CM | POA: Diagnosis not present

## 2019-06-07 DIAGNOSIS — M519 Unspecified thoracic, thoracolumbar and lumbosacral intervertebral disc disorder: Secondary | ICD-10-CM | POA: Diagnosis not present

## 2019-06-07 DIAGNOSIS — M4324 Fusion of spine, thoracic region: Secondary | ICD-10-CM | POA: Diagnosis not present

## 2019-06-07 DIAGNOSIS — N183 Chronic kidney disease, stage 3 unspecified: Secondary | ICD-10-CM | POA: Diagnosis not present

## 2019-06-07 DIAGNOSIS — I480 Paroxysmal atrial fibrillation: Secondary | ICD-10-CM | POA: Diagnosis not present

## 2019-06-07 DIAGNOSIS — I130 Hypertensive heart and chronic kidney disease with heart failure and stage 1 through stage 4 chronic kidney disease, or unspecified chronic kidney disease: Secondary | ICD-10-CM | POA: Diagnosis not present

## 2019-06-07 DIAGNOSIS — M4726 Other spondylosis with radiculopathy, lumbar region: Secondary | ICD-10-CM | POA: Diagnosis not present

## 2019-06-07 DIAGNOSIS — M4326 Fusion of spine, lumbar region: Secondary | ICD-10-CM | POA: Diagnosis not present

## 2019-06-07 DIAGNOSIS — D631 Anemia in chronic kidney disease: Secondary | ICD-10-CM | POA: Diagnosis not present

## 2019-06-07 DIAGNOSIS — I5033 Acute on chronic diastolic (congestive) heart failure: Secondary | ICD-10-CM | POA: Diagnosis not present

## 2019-06-07 DIAGNOSIS — M4645 Discitis, unspecified, thoracolumbar region: Secondary | ICD-10-CM | POA: Diagnosis not present

## 2019-06-07 DIAGNOSIS — M4625 Osteomyelitis of vertebra, thoracolumbar region: Secondary | ICD-10-CM | POA: Diagnosis not present

## 2019-06-07 DIAGNOSIS — F0280 Dementia in other diseases classified elsewhere without behavioral disturbance: Secondary | ICD-10-CM | POA: Diagnosis not present

## 2019-06-07 DIAGNOSIS — M792 Neuralgia and neuritis, unspecified: Secondary | ICD-10-CM | POA: Diagnosis not present

## 2019-06-21 DIAGNOSIS — R8271 Bacteriuria: Secondary | ICD-10-CM | POA: Diagnosis not present

## 2019-06-21 DIAGNOSIS — M4714 Other spondylosis with myelopathy, thoracic region: Secondary | ICD-10-CM | POA: Diagnosis not present

## 2019-06-21 DIAGNOSIS — N3946 Mixed incontinence: Secondary | ICD-10-CM | POA: Diagnosis not present

## 2019-06-21 DIAGNOSIS — R350 Frequency of micturition: Secondary | ICD-10-CM | POA: Diagnosis not present

## 2019-06-25 DIAGNOSIS — R2681 Unsteadiness on feet: Secondary | ICD-10-CM | POA: Diagnosis not present

## 2019-06-26 ENCOUNTER — Other Ambulatory Visit: Payer: Self-pay

## 2019-06-26 ENCOUNTER — Ambulatory Visit (INDEPENDENT_AMBULATORY_CARE_PROVIDER_SITE_OTHER): Payer: Medicare Other | Admitting: *Deleted

## 2019-06-26 DIAGNOSIS — Z5181 Encounter for therapeutic drug level monitoring: Secondary | ICD-10-CM | POA: Diagnosis not present

## 2019-06-26 DIAGNOSIS — I4891 Unspecified atrial fibrillation: Secondary | ICD-10-CM | POA: Diagnosis not present

## 2019-06-26 LAB — POCT INR: INR: 1.7 — AB (ref 2.0–3.0)

## 2019-06-26 NOTE — Patient Instructions (Signed)
Description    Take 1 tablet today and then continue taking 1 tablet daily except 1/2 tablet on Tuesdays and Thursdays. Recheck INR in 2 weeks. Call [facility] Clinic for medication changes. [phone].

## 2019-06-28 ENCOUNTER — Other Ambulatory Visit: Payer: Self-pay | Admitting: Neurological Surgery

## 2019-06-28 ENCOUNTER — Other Ambulatory Visit (HOSPITAL_COMMUNITY): Payer: Self-pay | Admitting: Neurological Surgery

## 2019-06-28 DIAGNOSIS — M4714 Other spondylosis with myelopathy, thoracic region: Secondary | ICD-10-CM

## 2019-07-02 NOTE — Progress Notes (Signed)
Cardiology Office Note    Date:  07/06/2019   ID:  [name], DOB [birth year], MRN [id number]  PCP:  [name], MD  Cardiologist:  [name]   Chief Complaint: 12 Months follow up  History of Present Illness:   [name] is a 79 y.o. female HTN, OSA intolerance to CPAP and oral appliance , HLD and PAF seen for follow up.   Cath 07/2000 showed normal coronaries. Echocardiogram in 2015 showed normal LV function with EF 60-65% and normal wall motion.  Myoview in 02/2014 showed normal EF and no ischemia. On Flecainide and warfarin for atrial fibrillation.   Admitted 6/29-7/14/19 for discitis/osteomyelitis with concerns of abscess versus old hematoma. S/p total facetectomy, revision of all construct and new fusion construct of T9-L3 by neurosurgery 7/8. ID recommended abx until 07/2018.  Seen by PA 01/01/19 and Toprol / Lasix decreased due to ortho stasis   Still with lots of leg weakness and back pain. Seems less expressive and stiffer with her Parkinson's  Discussed changing from coumadin to xarelto for her anticoagulation   Past Medical History:  Diagnosis Date  . Allergic rhinitis   . Arthritis    "all over"  . CHF (congestive heart failure) ([place])   . Chronic back pain   . DJD (degenerative joint disease)   . 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 GERD (gastroesophageal reflux disease)   . Hyperlipidemia   . Hypertension   . Lumbar stenosis   . OSA (obstructive sleep apnea)    "couldn't handle CPAP; use mouth guard some; not all the time" (01/06/2014)  . PAF (paroxysmal atrial fibrillation) ([place])   . Scoliosis    with radiculopathy L2-S1 with prior surgery  . Small bowel obstruction ([facility])    versus ileus after last bck surgery  . Spondylosis     Past Surgical History:  Procedure Laterality Date  . BACK SURGERY    . CATARACT EXTRACTION W/ INTRAOCULAR LENS  IMPLANT, BILATERAL  2011  . CHOLECYSTECTOMY N/A 07/07/2018   Procedure: LAPAROSCOPIC  CHOLECYSTECTOMY;  Surgeon: Erroll Luna, MD;  Location: [place];  Service: General;  Laterality: N/A;  . COLONOSCOPY  12/09/2004  . LIPOMA EXCISION  1980's   "fatty tumors"  . [place] SURGERY  02/2009   "ruptured disc"  . NASAL SEPTUM SURGERY  80's  . POSTERIOR LUMBAR FUSION  06/2010; 10/2011   "placed screws, rods, spacers both times"  . POSTERIOR LUMBAR FUSION 4 LEVEL N/A 12/13/2018   Procedure: THORACIC NINE -LUMBAR THREE POSTERIOR INSTRUMENTATION FUSION;  Surgeon: Judith Part, MD;  Location: [place];  Service: Neurosurgery;  Laterality: N/A;  . REPAIR DURAL / CSF LEAK  02/2009  . THORACIC LAMINECTOMY FOR EPIDURAL ABSCESS N/A 09/28/2018   Procedure: THORACIC NINE-THORACIC TEN, THORACIC TEN-THORACIC ELEVEN, THORACIC ELEVEN-THORACIC TWELVE LAMINECTOMIES FOR EPIDURAL ABSCESS;  Surgeon: Judith Part, MD;  Location: [place];  Service: Neurosurgery;  Laterality: N/A;  . TOTAL ABDOMINAL HYSTERECTOMY  02/1993    Current Medications:  Prior to Admission medications   Medication Sig Start Date End Date Taking? Authorizing Provider  acetaminophen (TYLENOL) 325 MG tablet Take 650 mg by mouth every 6 (six) hours as needed.   Yes [provider]  calcium carbonate (TUMS - DOSED IN MG ELEMENTAL CALCIUM) 500 MG chewable tablet Chew 1 tablet by mouth 2 (two) times daily.    Yes [provider]  carbidopa-levodopa (SINEMET IR) 25-100 MG tablet Take 2 tablets by mouth 3 (three) times daily. 07/09/18  Yes Sheikh, Omair Latif, DO  Cholecalciferol (HM VITAMIN D3) 100 MCG (4000 UT) CAPS Take 8,000 Units by mouth daily.   Yes [provider]  doxycycline (VIBRA-TABS) 100 MG tablet Take 1 tablet (100 mg total) by mouth 2 (two) times daily. 11/22/18  Yes Powers, Evern Core, MD  flecainide (TAMBOCOR) 50 MG tablet Take 1 tablet (50 mg total) by mouth 2 (two) times daily. 12/18/18 01/17/19 Yes Amin, Ankit Chirag, MD  furosemide (LASIX) 40 MG tablet Take 40 mg by mouth daily.   Yes [provider]  gabapentin (NEURONTIN) 600 MG tablet Take 600 mg by mouth 2 (two) times daily.   Yes [provider]  HYDROcodone-acetaminophen (NORCO/VICODIN) 5-325 MG tablet Take 1 tablet by mouth every 6 (six) hours as needed for moderate pain.   Yes [provider]  liver oil-zinc oxide (DESITIN) 40 % ointment Apply 1 application topically as needed for irritation.   Yes [provider]  Melatonin 10 MG TABS Take 10 mg by mouth at bedtime.   Yes [provider]  methocarbamol (ROBAXIN) 500 MG tablet Take 0.5 tablets (250 mg total) by mouth 2 (two) times a day for 20 days. 12/18/18 01/07/19 Yes Amin, Jeanella Flattery, MD  metoprolol succinate (TOPROL-XL) 50 MG 24 hr tablet Take 25 mg by mouth daily. Take with or immediately following a meal.   Yes [provider]  nystatin (MYCOSTATIN/NYSTOP) powder Apply 1 g topically 3 (three) times daily.   Yes [provider]  pantoprazole (PROTONIX) 40 MG tablet Take 40 mg by mouth 2 (two) times daily.    Yes [provider]  polyethylene glycol (MIRALAX / GLYCOLAX) packet Take 17 g by mouth daily. 07/10/18  Yes Sheikh, Omair Latif, DO  potassium chloride SA (K-DUR) 20 MEQ tablet Take 40 mEq by mouth daily.   Yes [provider]  pramipexole (MIRAPEX) 1 MG tablet Take 0.5 mg by mouth at bedtime.    Yes [provider]  pravastatin (PRAVACHOL) 40 MG tablet Take 40 mg by mouth at bedtime.    Yes [provider]  senna-docusate (SENOKOT-S) 8.6-50 MG tablet Take 2 tablets by mouth at bedtime as needed for mild constipation or moderate constipation. 12/18/18 01/17/19 Yes Amin, Ankit Chirag, MD  TRAVATAN Z 0.004 % SOLN ophthalmic solution Place 1 drop into both eyes at bedtime.  01/21/15  Yes [provider]  Triamcinolone Acetonide (TRIAMCINOLONE 0.1 % CREAM : EUCERIN) CREA Apply 1 application topically 2 (two) times daily.   Yes [provider]  warfarin (COUMADIN) 3  MG tablet Take 1.5 mg by mouth daily.    Yes [provider]    Allergies:   Lyrica [pregabalin], Zocor [simvastatin], and Relafen [nabumetone]   Social History   Socioeconomic History  . Marital status: Married    Spouse name: [name]  . Number of children: 2  . Years of education: 12th   . Highest education level: Not on file  Occupational History  . Occupation: retired    Fish farm manager: RETIRED    Comment: clerical office work  Tobacco Use  . Smoking status: Never Smoker  . Smokeless tobacco: Never Used  Substance and Sexual Activity  . Alcohol use: No    Alcohol/week: 0.0 standard drinks    Comment: 02/01/2014 " glasseof wine once in a blue moon; < once/year"  . Drug use: No  . Sexual activity: Never    Comment: hysterectomy  Other Topics Concern  . Not on file  Social  History Narrative   Patient lives at home with husband [name].   Patient is retired.   Patient has a high school education.    Patient has 2 children    Social Determinants of Health   Financial Resource Strain:   . Difficulty of Paying Living Expenses: Not on file  Food Insecurity:   . Worried About Charity fundraiser in the Last Year: Not on file  . Ran Out of Food in the Last Year: Not on file  Transportation Needs:   . Lack of Transportation (Medical): Not on file  . Lack of Transportation (Non-Medical): Not on file  Physical Activity:   . Days of Exercise per Week: Not on file  . Minutes of Exercise per Session: Not on file  Stress:   . Feeling of Stress : Not on file  Social Connections:   . Frequency of Communication with Friends and Family: Not on file  . Frequency of Social Gatherings with Friends and Family: Not on file  . Attends Religious Services: Not on file  . Active Member of Clubs or Organizations: Not on file  . Attends Archivist Meetings: Not on file  . Marital Status: Not on file     Family History:  The patient's family history includes Allergies in her  sister; Arthritis in her mother; Asthma in her sister; Heart attack in her father; Hypertension in her sister; Lung cancer in her sister.   ROS:   Please see the history of present illness.    ROS All other systems reviewed and are negative.   PHYSICAL EXAM:   VS:  BP (!) 142/62   Pulse 91   Ht 5\' 5"  (1.651 m)   Wt 182 lb 12.8 oz (82.9 kg)   SpO2 98%   BMI 30.42 kg/m     Affect appropriate Healthy:  appears stated age 65: normal Neck supple with no adenopathy JVP normal no bruits no thyromegaly Lungs clear with no wheezing and good diaphragmatic motion Heart:  S1/S2 no murmur, no rub, gallop or click PMI normal Abdomen: benighn, BS positve, no tenderness, no AAA no bruit.  No HSM or HJR Distal pulses intact with no bruits No edema Neuro non-focal Skin warm and dry No muscular weakness   Wt Readings from Last 3 Encounters:  07/06/19 182 lb 12.8 oz (82.9 kg)  04/23/19 170 lb (77.1 kg)  03/27/19 164 lb (74.4 kg)      Studies/Labs Reviewed:   EKG: 01/01/19  sinus rhythm at rate of 79 bpm, nonspecific T wave abnormality  Recent Labs: 12/19/2018: Magnesium 1.6 01/11/2019: ALT 16; BUN 18; Creat 0.93; Hemoglobin 10.1; Platelets 251; Potassium 4.9; Sodium 139   Lipid Panel    Component Value Date/Time   CHOL  12/22/2006 0550    183        ATP III CLASSIFICATION:  <200     mg/dL   Desirable  200-239  mg/dL   Borderline High  >=240    mg/dL   High   TRIG 174 (H) 12/22/2006 0550   HDL 47 12/22/2006 0550   CHOLHDL 3.9 12/22/2006 0550   VLDL 35 12/22/2006 0550   LDLCALC (H) 12/22/2006 0550    101        Total Cholesterol/HDL:CHD Risk Coronary Heart Disease Risk Table                     Men   Women  1/2 Average Risk   3.4  3.3    Additional studies/ records that were reviewed today include:   As summarized above    ASSESSMENT & PLAN:    1. PAF -Sinus rhythm.  Continue flecainide, Toprol Discussed changing to DOAC.  INR 1.9 today and normal renal  function  Start Xarelto 20 mg daily 30 day free card given Discussed with coumadin clinic and pharm D  2.  Orthostatic hypotension with history of hypertension -No recurrent orthostasis Toprol and lasix doses reduced .likely related to age and Parkinson's   3. OSA - Intolerance to CPAP  4. Osteomyelitis and  back surgery - f/u PT and primary   5. Parkinsons :  Seems worse on sinemet f/u with Dr Tat Discussed importance of home PT     Medication Adjustments/Labs and Tests Ordered: Dc coumadin start xarelto 20 mg daily   Signed, Jenkins Rouge, MD  07/06/2019 4:11 PM    [facility] [address], [place], [place]  [address] Phone: [phone]; Fax: [phone]

## 2019-07-06 ENCOUNTER — Ambulatory Visit: Payer: Medicare Other | Admitting: Cardiovascular Disease

## 2019-07-06 ENCOUNTER — Ambulatory Visit (INDEPENDENT_AMBULATORY_CARE_PROVIDER_SITE_OTHER): Payer: Medicare Other | Admitting: *Deleted

## 2019-07-06 ENCOUNTER — Other Ambulatory Visit: Payer: Self-pay

## 2019-07-06 VITALS — BP 142/62 | HR 91 | Ht 65.0 in | Wt 182.8 lb

## 2019-07-06 DIAGNOSIS — I4891 Unspecified atrial fibrillation: Secondary | ICD-10-CM

## 2019-07-06 DIAGNOSIS — Z5181 Encounter for therapeutic drug level monitoring: Secondary | ICD-10-CM | POA: Diagnosis not present

## 2019-07-06 DIAGNOSIS — I480 Paroxysmal atrial fibrillation: Secondary | ICD-10-CM | POA: Diagnosis not present

## 2019-07-06 LAB — POCT INR: INR: 1.9 — AB (ref 2.0–3.0)

## 2019-07-06 MED ORDER — RIVAROXABAN 20 MG PO TABS: 20.0000 mg | ORAL_TABLET | Freq: Every day | ORAL | 11 refills | Status: DC

## 2019-07-06 NOTE — Patient Instructions (Addendum)
Description   [name] switched pt to Xarelto today after INR checked.  Call [facility]in Clinic for medication changes. [phone].

## 2019-07-06 NOTE — Patient Instructions (Addendum)
Medication Instructions:  Your physician has recommended you make the following change in your medication:  1-Xarelto 20 mg by mouth daily.   *If you need a refill on your cardiac medications before your next appointment, please call your pharmacy*  Lab Work: If you have labs (blood work) drawn today and your tests are completely normal, you will receive your results only by: Marland Kitchen MyChart Message (if you have MyChart) OR . A paper copy in the mail If you have any lab test that is abnormal or we need to change your treatment, we will call you to review the results.  Testing/Procedures: None ordered today.  Follow-Up: At [facility], you and your health needs are our priority.  As part of our continuing mission to provide you with exceptional heart care, we have created designated Provider Care Teams.  These Care Teams include your primary Cardiologist (physician) and Advanced Practice Providers (APPs -  Physician Assistants and Nurse Practitioners) who all work together to provide you with the care you need, when you need it.  Your next appointment:   6 month(s)  The format for your next appointment:   In Person  Provider:   You may see [name] or one of the following Advanced Practice Providers on your designated Care Team:    Truitt Merle, NP  Cecilie Kicks, NP  Kathyrn Drown, NP

## 2019-07-09 DIAGNOSIS — N3946 Mixed incontinence: Secondary | ICD-10-CM | POA: Diagnosis not present

## 2019-07-09 DIAGNOSIS — R300 Dysuria: Secondary | ICD-10-CM | POA: Diagnosis not present

## 2019-07-09 DIAGNOSIS — R8279 Other abnormal findings on microbiological examination of urine: Secondary | ICD-10-CM | POA: Diagnosis not present

## 2019-07-09 DIAGNOSIS — R351 Nocturia: Secondary | ICD-10-CM | POA: Diagnosis not present

## 2019-07-10 ENCOUNTER — Telehealth: Payer: Self-pay

## 2019-07-10 ENCOUNTER — Other Ambulatory Visit: Payer: Self-pay

## 2019-07-10 MED ORDER — DOXYCYCLINE HYCLATE 100 MG PO TABS: 100.0000 mg | ORAL_TABLET | Freq: Two times a day (BID) | ORAL | 0 refills | Status: DC

## 2019-07-10 NOTE — Telephone Encounter (Signed)
Received refill request from [name] for Doxy. Per last note patient was supposed to continue on antibiotics for two months and follow up. Patient missed follow up on 8/20; will forward message to MD to advise if refill is okay. IS scheduled to follow up with Dr. Megan Salon on 2/23. [name]

## 2019-07-10 NOTE — Telephone Encounter (Signed)
Please give her 1 refill of doxycycline and schedule her to see me in the next few weeks.

## 2019-07-20 ENCOUNTER — Ambulatory Visit (HOSPITAL_COMMUNITY): Payer: Medicare Other

## 2019-07-20 ENCOUNTER — Ambulatory Visit (HOSPITAL_COMMUNITY)
Admission: RE | Admit: 2019-07-20 | Discharge: 2019-07-20 | Disposition: A | Discharge status: Home / Self Care | Payer: Medicare Other | Source: Ambulatory Visit | Attending: Neurological Surgery | Admitting: Neurological Surgery

## 2019-07-20 ENCOUNTER — Other Ambulatory Visit: Payer: Self-pay

## 2019-07-20 DIAGNOSIS — M4714 Other spondylosis with myelopathy, thoracic region: Secondary | ICD-10-CM | POA: Diagnosis not present

## 2019-07-20 DIAGNOSIS — M5124 Other intervertebral disc displacement, thoracic region: Secondary | ICD-10-CM | POA: Diagnosis not present

## 2019-07-20 DIAGNOSIS — M48061 Spinal stenosis, lumbar region without neurogenic claudication: Secondary | ICD-10-CM | POA: Diagnosis not present

## 2019-07-26 DIAGNOSIS — R2681 Unsteadiness on feet: Secondary | ICD-10-CM | POA: Diagnosis not present

## 2019-07-27 DIAGNOSIS — R350 Frequency of micturition: Secondary | ICD-10-CM | POA: Diagnosis not present

## 2019-07-27 DIAGNOSIS — N3946 Mixed incontinence: Secondary | ICD-10-CM | POA: Diagnosis not present

## 2019-07-31 ENCOUNTER — Other Ambulatory Visit: Payer: Self-pay

## 2019-07-31 ENCOUNTER — Ambulatory Visit (INDEPENDENT_AMBULATORY_CARE_PROVIDER_SITE_OTHER): Payer: Medicare Other | Admitting: Internal Medicine

## 2019-07-31 DIAGNOSIS — M4620 Osteomyelitis of vertebra, site unspecified: Secondary | ICD-10-CM

## 2019-07-31 NOTE — Progress Notes (Signed)
[facility]  Patient Active Problem List   Diagnosis Date Noted  . UTI (urinary tract infection) 12/06/2018  . Vertebral osteomyelitis ([place]) 12/05/2018  . Spinal stenosis 12/05/2018  . AKI (acute kidney injury) ([place]) 12/05/2018  . Acute metabolic encephalopathy [id number]  . Chronic diastolic (congestive) heart failure ([place]) 10/07/2018  . GERD (gastroesophageal reflux disease) 10/07/2018  . Abnormal glucose tolerance test 07/01/2018  . Colon, diverticulosis 07/01/2018  . 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 Glaucoma 07/01/2018  . Chronic kidney disease (CKD), stage III (moderate) 07/01/2018  . Neuropathy 07/01/2018  . Pure hypercholesterolemia 07/01/2018  . Restless leg 07/01/2018  . Discitis of thoracolumbar region 07/01/2018  . Acute bronchitis due to infection 05/04/2017  . Cough 04/21/2016  . Preoperative cardiovascular examination 04/18/2015  . Parkinsonism ([place]) 05/15/2014  . Chronic anticoagulation -warfarin therapy 02/03/2014  . Atrial fibrillation with RVR ([place]) 01/31/2014  . Gait instability 11/16/2013  . Edema 03/03/2010  . ATRIAL FIBRILLATION 04/23/2008  . Hyperlipidemia 01/31/2008  . Obstructive sleep apnea 01/31/2008  . GLAUCOMA 01/30/2008  . Seasonal and perennial allergic rhinitis 01/30/2008  . Osteoarthritis 01/30/2008    Patient's Medications  New Prescriptions   No medications on file  Previous Medications   ACETAMINOPHEN (TYLENOL) 325 MG TABLET    Take 650 mg by mouth every 6 (six) hours as needed.   CALCIUM CARBONATE (TUMS - DOSED IN MG ELEMENTAL CALCIUM) 500 MG CHEWABLE TABLET    Chew 1 tablet by mouth 2 (two) times daily.    CARBIDOPA-LEVODOPA (SINEMET IR) 25-100 MG TABLET    Take 2 tablets by mouth 3 (three) times daily.   CHOLECALCIFEROL (HM VITAMIN D3) 100 MCG (4000 UT) CAPS    Take 8,000 Units by mouth daily.   DOXYCYCLINE (VIBRA-TABS) 100 MG TABLET    Take 1 tablet (100 mg total) by mouth 2 (two) times daily.   FLECAINIDE (TAMBOCOR) 50 MG TABLET    Take 1 tablet (50 mg total) by mouth 2 (two) times daily. PT NEEDS AN APPOINTMENT PLEASE HAVE HER CALL   FUROSEMIDE (LASIX) 20 MG TABLET    Take 1 tablet (20 mg total) by mouth daily.   GABAPENTIN (NEURONTIN) 600 MG TABLET    Take 600 mg by mouth 2 (two) times daily. Thinks 300mg  twice a day now   LIVER OIL-ZINC OXIDE (DESITIN) 40 % OINTMENT    Apply 1 application topically as needed for irritation.   MELATONIN 10 MG TABS    Take 10 mg by mouth at bedtime.   METOPROLOL SUCCINATE (TOPROL-XL) 50 MG 24 HR TABLET    Take 1/2 tablet by mouth daily.Take with or immediately following a meal.   NITROFURANTOIN, MACROCRYSTAL-MONOHYDRATE, (MACROBID) 100 MG CAPSULE    Take 100 mg by mouth 2 (two) times daily.   NYSTATIN (MYCOSTATIN/NYSTOP) POWDER    Apply 1 g topically 3 (three) times daily.   PANTOPRAZOLE (PROTONIX) 40 MG TABLET    Take 40 mg by mouth 2 (two) times daily.    POLYETHYLENE GLYCOL (MIRALAX / GLYCOLAX) PACKET    Take 17 g by mouth daily.   POTASSIUM CHLORIDE SA (K-DUR) 20 MEQ TABLET    Take 1 tablet (20 mEq total) by mouth daily.   PRAMIPEXOLE (MIRAPEX) 1 MG TABLET    Take 1 mg by mouth at bedtime.    PRAVASTATIN (PRAVACHOL) 40 MG TABLET    Take 40 mg by mouth at bedtime.    RIVAROXABAN (XARELTO) 20 MG TABS TABLET  Take 1 tablet (20 mg total) by mouth daily with supper.   TRAVATAN Z 0.004 % SOLN OPHTHALMIC SOLUTION    Place 1 drop into both eyes at bedtime.    TRIAMCINOLONE ACETONIDE (TRIAMCINOLONE 0.1 % CREAM : EUCERIN) CREA    Apply 1 application topically 2 (two) times daily.  Modified Medications   No medications on file  Discontinued Medications   No medications on file    Subjective: Ms. Werking is in for her routine follow-up visit.  She is accompanied by her caregiver.  Her daughter Suzi Roots participated in the visit by phone.  Ms. 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   She underwent surgical drainage.  No organisms were seen on Gram stain and cultures were negative.  She received 6 weeks of IV vancomycin and ceftriaxone, completing therapy on 11/23/2018 before converting to oral doxycycline.  She has been on doxycycline ever since.  She has not had any problems tolerating the doxycycline.  She had persistent leg pain and weakness and underwent T9-L3 fusion in July of last year.  Her daughter tells me that she has had increasing pain in her legs and feet recently.  She saw her neurosurgeon, Dr. Venetia Constable, recently who ordered repeat MRI scans which showed:  Lumbar MRI 07/20/2019  IMPRESSION: 1. Sequelae of osteomyelitis discitis at T12-L1 without evidence of active infection. 2. Interval posterior decompression and fusion at T12-L1 with resolution of spinal canal stenosis. Persistent severe bilateral neuroforaminal stenosis.  Thoracic MRI 07/20/2019  IMPRESSION: 1. No evidence of active osteomyelitis or discitis. 2. Resolution of the epidural abscess previously extending from T10-11 through T12-L1 with no residual abnormality in the spinal canal. 3. Slight progression of small central subligamentous disc protrusion at T8-9 without neural impingement.   Review of Systems: Review of Systems  Constitutional: Negative for chills and fever.  Respiratory: Negative for cough and shortness of breath.   Cardiovascular: Negative for chest pain.  Gastrointestinal: Negative for abdominal pain, diarrhea, nausea and vomiting.  Musculoskeletal: Negative for back pain.       Recent worsening of leg and foot pain.  Neurological: Positive for focal weakness.    Past Medical History:  Diagnosis Date  . Allergic rhinitis   . Arthritis    "all over"  . CHF (congestive heart failure) ([place])   . Chronic back pain   . DJD (degenerative joint disease)   . 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 GERD (gastroesophageal reflux disease)   . Hyperlipidemia   . Hypertension   .  Lumbar stenosis   . OSA (obstructive sleep apnea)    "couldn't handle CPAP; use mouth guard some; not all the time" (01/06/2014)  . PAF (paroxysmal atrial fibrillation) ([place])   . Scoliosis    with radiculopathy L2-S1 with prior surgery  . Small bowel obstruction ([facility])    versus ileus after last bck surgery  . Spondylosis     Social History   Tobacco Use  . Smoking status: Never Smoker  . Smokeless tobacco: Never Used  Substance Use Topics  . Alcohol use: No    Alcohol/week: 0.0 standard drinks    Comment: 02/01/2014 " glasseof wine once in a blue moon; < once/year"  . Drug use: No    Family History  Problem Relation Age of Onset  . Arthritis Mother   . Heart attack Father   . Hypertension Sister   . Lung cancer Sister   . Asthma Sister   .  Allergies Sister     Allergies  Allergen Reactions  . Lyrica [Pregabalin] Other (See Comments)    Felt loopy  . Zocor [Simvastatin] Other (See Comments)    Myalgias  . Relafen [Nabumetone] Rash    Objective: Vitals:   07/31/19 1039  BP: 103/61  Pulse: 86   There is no height or weight on file to calculate BMI.  Physical Exam Constitutional:      Comments: She is seated in a wheelchair.  She tells me that she has no recall of her hospitalizations last year.  Most of her history is provided by her daughter.  Cardiovascular:     Rate and Rhythm: Normal rate and regular rhythm.     Heart sounds: No murmur.  Pulmonary:     Effort: Pulmonary effort is normal.     Breath sounds: Normal breath sounds.  Abdominal:     Palpations: Abdomen is soft.     Tenderness: There is no abdominal tenderness.  Musculoskeletal:     Comments: Her extensive thoracolumbar incisions have healed without evidence of active infection.  Psychiatric:        Mood and Affect: Mood normal.     Lab Results Sed Rate (mm/hr)  Date Value  12/06/2018 32 (H)  09/27/2018 2  07/01/2018 9   CRP  Date Value  01/11/2019 18.9 mg/L (H)  12/06/2018 7.1  mg/dL (H)  09/27/2018 1.0 mg/dL (H)   Sed Rate (mm/hr)  Date Value  12/06/2018 32 (H)  09/27/2018 2  07/01/2018 9   CRP  Date Value  01/11/2019 18.9 mg/L (H)  12/06/2018 7.1 mg/dL (H)  09/27/2018 1.0 mg/dL (H)     Problem List Items Addressed This Visit      Unprioritized   Vertebral osteomyelitis ([place])    I doubt that her recent, worsening calf and foot pain is due to relapsing spine infection.  I told them that it is unclear if she needs to be on chronic doxycycline therapy.  I told them that the only way to know for certain that her infection has been cured is to eventually stop doxycycline and wait and see what happens.  I will repeat her inflammatory markers today.  She is scheduled to follow-up with Dr. Zada Finders on 08/02/2019.  I asked her daughter to contact me after that visit to review Dr. Barbra Sarks recommendations and her lab results.      Relevant Orders   Sedimentation rate   C-reactive protein       Michel Bickers, MD [facility] for Infectious [place] [phone] pager   [phone] cell 07/31/2019, 11:39 AM

## 2019-07-31 NOTE — Assessment & Plan Note (Signed)
I doubt that her recent, worsening calf and foot pain is due to relapsing spine infection.  I told them that it is unclear if she needs to be on chronic doxycycline therapy.  I told them that the only way to know for certain that her infection has been cured is to eventually stop doxycycline and wait and see what happens.  I will repeat her inflammatory markers today.  She is scheduled to follow-up with Dr. Zada Finders on 08/02/2019.  I asked her daughter to contact me after that visit to review Dr. Barbra Sarks recommendations and her lab results.

## 2019-08-01 DIAGNOSIS — M5416 Radiculopathy, lumbar region: Secondary | ICD-10-CM | POA: Diagnosis not present

## 2019-08-01 DIAGNOSIS — I10 Essential (primary) hypertension: Secondary | ICD-10-CM | POA: Diagnosis not present

## 2019-08-01 DIAGNOSIS — Z6831 Body mass index (BMI) 31.0-31.9, adult: Secondary | ICD-10-CM | POA: Diagnosis not present

## 2019-08-01 LAB — C-REACTIVE PROTEIN: CRP: 3 mg/L (ref <8.0)

## 2019-08-01 LAB — SEDIMENTATION RATE: Sed Rate: 11 mm/h (ref 0–30)

## 2019-08-08 ENCOUNTER — Telehealth: Payer: Self-pay

## 2019-08-08 NOTE — Telephone Encounter (Signed)
Patient called to notify RN that she has enough doxycycline to get her through Friday am and then she will stop taking it, per Dr. Hale Bogus recommendation. No other questions or concerns noted at this time. Instructed to call office with any questions or concerns.   Maeson Purohit Lorita Officer, RN

## 2019-08-09 DIAGNOSIS — M79604 Pain in right leg: Secondary | ICD-10-CM | POA: Diagnosis not present

## 2019-08-09 NOTE — Progress Notes (Signed)
[name] was seen today in follow up for PSP.  Daughters are present on the phone and supplement the history.  My previous records were reviewed prior to todays visit as well as outside records available to me. Pt last fall was 3 months ago - she didn't have her AFO on and fell.  She has 24/7 caregivers.  Pt denies lightheadedness, near syncope.  No hallucinations.   She does have a history of discitis and vertebral osteomyelitis.  Last saw infectious disease on February 23.  They stopped her doxycycline and daughters state that they just stopped it today.  She has paresthesias of the feet that are painful.  It has gotten increasingly worse.  She is going to [facility] soon.  They tried to increase the gabapentin but it made her cognitively dull.  She has an appt with dr. Maryjean Ka as well on 3/24.  Primary care started her on Ultram yesterday.  She did sleep well.  She did not seem to have cognitive change with them, although she has had cognitive change with that in the past.  She is acting out of the dreams - screaming and acting out at night.  They have to keep a body pillow if she is in a bed.  She generally sleeps in a recliner chair.    Current prescribed movement disorder medications: Carbidopa/levodopa 25/100, 2 tablets 3 times per day Pramipexole 0.5 mg at night for restless leg   PREVIOUS MEDICATIONS: Sinemet and Mirapex  ALLERGIES:   Allergies  Allergen Reactions  . Lyrica [Pregabalin] Other (See Comments)    Felt loopy  . Zocor [Simvastatin] Other (See Comments)    Myalgias  . Relafen [Nabumetone] Rash    CURRENT MEDICATIONS:  Outpatient Encounter Medications as of 08/10/2019  Medication Sig  . acetaminophen (TYLENOL) 325 MG tablet Take 650 mg by mouth every 6 (six) hours as needed.  . calcium carbonate (TUMS - DOSED IN MG ELEMENTAL CALCIUM) 500 MG chewable tablet Chew 1 tablet by mouth 2 (two) times daily. As needed  . carbidopa-levodopa (SINEMET IR) 25-100 MG  tablet Take 2 tablets by mouth 3 (three) times daily.  . Cholecalciferol (HM VITAMIN D3) 100 MCG (4000 UT) CAPS Take 8,000 Units by mouth daily.  . flecainide (TAMBOCOR) 50 MG tablet Take 1 tablet (50 mg total) by mouth 2 (two) times daily. PT NEEDS AN APPOINTMENT PLEASE HAVE HER CALL  . furosemide (LASIX) 20 MG tablet Take 1 tablet (20 mg total) by mouth daily.  Marland Kitchen gabapentin (NEURONTIN) 300 MG capsule Take 600 mg by mouth at bedtime. Thinks 300mg  twice a day now  . liver oil-zinc oxide (DESITIN) 40 % ointment Apply 1 application topically as needed for irritation.  . Melatonin 10 MG TABS Take 10 mg by mouth at bedtime.  . metoprolol succinate (TOPROL-XL) 50 MG 24 hr tablet Take 1/2 tablet by mouth daily.Take with or immediately following a meal. (Patient taking differently: Take 1/2 tablet by mouth daily.Take with or immediately following a meal. Dose lowered now taking 12.5 mg daily)  . mirabegron ER (MYRBETRIQ) 50 MG TB24 tablet Take 50 mg by mouth daily.  . pantoprazole (PROTONIX) 40 MG tablet Take 40 mg by mouth 2 (two) times daily.   . polyethylene glycol (MIRALAX / GLYCOLAX) packet Take 17 g by mouth daily.  . potassium chloride SA (K-DUR) 20 MEQ tablet Take 1 tablet (20 mEq total) by mouth daily.  . pravastatin (PRAVACHOL) 40 MG tablet Take 40 mg by  mouth at bedtime.   . rivaroxaban (XARELTO) 20 MG TABS tablet Take 1 tablet (20 mg total) by mouth daily with supper.  . TRAVATAN Z 0.004 % SOLN ophthalmic solution Place 1 drop into both eyes at bedtime.   . [DISCONTINUED] pramipexole (MIRAPEX) 1 MG tablet Take 1 mg by mouth at bedtime. Half to a whole tablet at bedtime  . [DISCONTINUED] doxycycline (VIBRA-TABS) 100 MG tablet Take 1 tablet (100 mg total) by mouth 2 (two) times daily. (Patient not taking: Reported on 08/10/2019)  . [DISCONTINUED] nitrofurantoin, macrocrystal-monohydrate, (MACROBID) 100 MG capsule Take 100 mg by mouth 2 (two) times daily.  . [DISCONTINUED] nystatin  (MYCOSTATIN/NYSTOP) powder Apply 1 g topically 3 (three) times daily.  . [DISCONTINUED] Triamcinolone Acetonide (TRIAMCINOLONE 0.1 % CREAM : EUCERIN) CREA Apply 1 application topically 2 (two) times daily.   No facility-administered encounter medications on file as of 08/10/2019.    PHYSICAL EXAMINATION:    VITALS:   Vitals:   08/10/19 1459  BP: 127/74  Pulse: 75  SpO2: 95%  Weight: 184 lb (83.5 kg)  Height: 5\' 5"  (1.651 m)    GEN:  The patient appears stated age and is in NAD. HEENT:  Normocephalic, atraumatic.  The mucous membranes are moist. The superficial temporal arteries are without ropiness or tenderness. CV:  RRR Lungs:  CTAB Neck/HEME:  There are no carotid bruits bilaterally. Exts:  There is mild LE edema bilaterally  Neurological examination:  Orientation: The patient is alert and oriented x3. Cranial nerves: There is good facial symmetry with mild facial hypomimia. The speech is fluent and clear. Soft palate rises symmetrically and there is no tongue deviation. Hearing is intact to conversational tone. Sensation: Sensation is intact to light touch throughout Motor: Strength is at least antigravity x4.  Movement examination: Tone: There is mild increased tone in the LUE/LLE Abnormal movements: none Coordination:  There is decremation with RAM's, R more than L.      ASSESSMENT/PLAN:  1.  PSP  -Continue carbidopa/levodopa 25/100, 2 tablets 3 times per day  -d/c pramipexixole (on for RLS) -may be contributing to some of the lower extremity edema.  -Levodopa challenge test has been done in the past and levodopa was only mildly beneficial, but the patient thinks it is helpful, so we have continued it.  2.  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  She has been following with infectious disease.  I just stopped her chronic doxycycline.  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 Maryjean Ka upcoming.  4.  RLS and RBD  -start klonopin - 0.5 mg - 1/2 po q hs.  Discussed risks of this in that population.  R/B/SE were discussed.  The opportunity to ask questions was given and they were answered to the best of my ability.  The patient expressed understanding and willingness to follow the outlined treatment protocols.  Total time spent on today's visit was 35 minutes, including both face-to-face time and nonface-to-face time.  Time included that spent on review of records (prior notes available to me/labs/imaging if pertinent), discussing treatment and goals, answering patient's questions and coordinating care.  Cc:  [name], MD

## 2019-08-10 ENCOUNTER — Ambulatory Visit (INDEPENDENT_AMBULATORY_CARE_PROVIDER_SITE_OTHER): Payer: Medicare Other | Admitting: Neurology

## 2019-08-10 ENCOUNTER — Other Ambulatory Visit: Payer: Self-pay

## 2019-08-10 ENCOUNTER — Encounter: Payer: Self-pay | Admitting: Neurology

## 2019-08-10 VITALS — BP 127/74 | HR 75 | Ht 65.0 in | Wt 184.0 lb

## 2019-08-10 DIAGNOSIS — G2581 Restless legs syndrome: Secondary | ICD-10-CM

## 2019-08-10 DIAGNOSIS — G231 Progressive supranuclear ophthalmoplegia [Steele-Richardson-Olszewski]: Secondary | ICD-10-CM | POA: Diagnosis not present

## 2019-08-10 DIAGNOSIS — G4752 REM sleep behavior disorder: Secondary | ICD-10-CM | POA: Diagnosis not present

## 2019-08-10 DIAGNOSIS — G609 Hereditary and idiopathic neuropathy, unspecified: Secondary | ICD-10-CM

## 2019-08-10 MED ORDER — CLONAZEPAM 0.5 MG PO TABS: 0.2500 mg | ORAL_TABLET | Freq: Every day | ORAL | 1 refills | Status: DC

## 2019-08-10 NOTE — Patient Instructions (Addendum)
1.  Stop pramipexole 2.  Hold ultram 3.  Start klonopin (clonazepam) - 0.5 mg - 1/2 tablet at bedtime 4.  Let me know the outcome of your appts at [facility] and with Dr. Maryjean Ka  The physicians and staff at [facility] are committed to providing excellent care. You may receive a survey requesting feedback about your experience at our office. We strive to receive "very good" responses to the survey questions. If you feel that your experience would prevent you from giving the office a "very good " response, please contact our office to try to remedy the situation. We may be reached at [phone]. Thank you for taking the time out of your busy day to complete the survey.

## 2019-08-13 ENCOUNTER — Telehealth: Payer: Self-pay | Admitting: Neurology

## 2019-08-13 NOTE — Telephone Encounter (Signed)
Left message for patient to contact office.

## 2019-08-13 NOTE — Telephone Encounter (Signed)
Patient called to report her feet and legs hurt so bad she has not slept all weekend.   Patient stated she had a recent medication change that may be related to the pain but did not remember the name of the medication.  [facility] on [address] in [place]

## 2019-08-13 NOTE — Telephone Encounter (Signed)
I think that it is probably b/c I asked her to hold her ultram.  She can restart that but continue the klonopin for now and see how she does

## 2019-08-13 NOTE — Telephone Encounter (Signed)
Patient has been notified directly of Dr Doristine Devoid recommendations and voiced understanding.

## 2019-08-13 NOTE — Telephone Encounter (Signed)
Spoke with patient and she stated the new medication is not working. She has not be able to get any rest. And her legs and feet hurt real bad and there too sore to walk on. She wants to go back to her old regimen.

## 2019-08-16 DIAGNOSIS — G20 Parkinson's disease: Secondary | ICD-10-CM | POA: Diagnosis not present

## 2019-08-16 DIAGNOSIS — R634 Abnormal weight loss: Secondary | ICD-10-CM | POA: Diagnosis not present

## 2019-08-16 DIAGNOSIS — R202 Paresthesia of skin: Secondary | ICD-10-CM | POA: Diagnosis not present

## 2019-08-16 DIAGNOSIS — M79605 Pain in left leg: Secondary | ICD-10-CM | POA: Diagnosis not present

## 2019-08-16 DIAGNOSIS — E559 Vitamin D deficiency, unspecified: Secondary | ICD-10-CM | POA: Diagnosis not present

## 2019-08-16 DIAGNOSIS — G609 Hereditary and idiopathic neuropathy, unspecified: Secondary | ICD-10-CM | POA: Diagnosis not present

## 2019-08-16 DIAGNOSIS — M79604 Pain in right leg: Secondary | ICD-10-CM | POA: Diagnosis not present

## 2019-08-16 DIAGNOSIS — G2581 Restless legs syndrome: Secondary | ICD-10-CM | POA: Diagnosis not present

## 2019-08-23 DIAGNOSIS — R2681 Unsteadiness on feet: Secondary | ICD-10-CM | POA: Diagnosis not present

## 2019-08-30 DIAGNOSIS — G20 Parkinson's disease: Secondary | ICD-10-CM | POA: Diagnosis not present

## 2019-08-30 DIAGNOSIS — G2581 Restless legs syndrome: Secondary | ICD-10-CM | POA: Diagnosis not present

## 2019-08-30 DIAGNOSIS — G5601 Carpal tunnel syndrome, right upper limb: Secondary | ICD-10-CM | POA: Diagnosis not present

## 2019-08-30 DIAGNOSIS — M545 Low back pain: Secondary | ICD-10-CM | POA: Diagnosis not present

## 2019-09-11 ENCOUNTER — Other Ambulatory Visit: Payer: Self-pay

## 2019-09-11 DIAGNOSIS — N183 Chronic kidney disease, stage 3 unspecified: Secondary | ICD-10-CM | POA: Diagnosis not present

## 2019-09-11 DIAGNOSIS — I129 Hypertensive chronic kidney disease with stage 1 through stage 4 chronic kidney disease, or unspecified chronic kidney disease: Secondary | ICD-10-CM | POA: Diagnosis not present

## 2019-09-11 DIAGNOSIS — Z0000 Encounter for general adult medical examination without abnormal findings: Secondary | ICD-10-CM | POA: Diagnosis not present

## 2019-09-11 DIAGNOSIS — E7800 Pure hypercholesterolemia, unspecified: Secondary | ICD-10-CM | POA: Diagnosis not present

## 2019-09-11 MED ORDER — FLECAINIDE ACETATE 50 MG PO TABS: 50.0000 mg | ORAL_TABLET | Freq: Two times a day (BID) | ORAL | 2 refills | Status: DC

## 2019-09-11 NOTE — Telephone Encounter (Signed)
Pt's medication was sent to pt's pharmacy as requested. Confirmation received.  °

## 2019-09-13 DIAGNOSIS — N3946 Mixed incontinence: Secondary | ICD-10-CM | POA: Diagnosis not present

## 2019-09-18 DIAGNOSIS — R531 Weakness: Secondary | ICD-10-CM | POA: Diagnosis not present

## 2019-09-18 DIAGNOSIS — S0093XA Contusion of unspecified part of head, initial encounter: Secondary | ICD-10-CM | POA: Diagnosis not present

## 2019-09-18 DIAGNOSIS — S0990XA Unspecified injury of head, initial encounter: Secondary | ICD-10-CM | POA: Diagnosis not present

## 2019-09-18 DIAGNOSIS — R52 Pain, unspecified: Secondary | ICD-10-CM | POA: Diagnosis not present

## 2019-09-18 DIAGNOSIS — R630 Anorexia: Secondary | ICD-10-CM | POA: Diagnosis not present

## 2019-09-18 DIAGNOSIS — W01198A Fall on same level from slipping, tripping and stumbling with subsequent striking against other object, initial encounter: Secondary | ICD-10-CM | POA: Diagnosis not present

## 2019-09-18 DIAGNOSIS — R220 Localized swelling, mass and lump, head: Secondary | ICD-10-CM | POA: Diagnosis not present

## 2019-09-18 DIAGNOSIS — W19XXXA Unspecified fall, initial encounter: Secondary | ICD-10-CM | POA: Diagnosis not present

## 2019-09-18 DIAGNOSIS — M542 Cervicalgia: Secondary | ICD-10-CM | POA: Diagnosis not present

## 2019-09-18 DIAGNOSIS — S0083XA Contusion of other part of head, initial encounter: Secondary | ICD-10-CM | POA: Diagnosis not present

## 2019-09-18 DIAGNOSIS — R519 Headache, unspecified: Secondary | ICD-10-CM | POA: Diagnosis not present

## 2019-09-21 DIAGNOSIS — W19XXXD Unspecified fall, subsequent encounter: Secondary | ICD-10-CM | POA: Diagnosis not present

## 2019-09-21 DIAGNOSIS — R410 Disorientation, unspecified: Secondary | ICD-10-CM | POA: Diagnosis not present

## 2019-09-21 DIAGNOSIS — R350 Frequency of micturition: Secondary | ICD-10-CM | POA: Diagnosis not present

## 2019-09-24 DIAGNOSIS — R350 Frequency of micturition: Secondary | ICD-10-CM | POA: Diagnosis not present

## 2019-09-25 ENCOUNTER — Ambulatory Visit (INDEPENDENT_AMBULATORY_CARE_PROVIDER_SITE_OTHER): Payer: Medicare Other | Admitting: Internal Medicine

## 2019-09-25 ENCOUNTER — Encounter: Payer: Self-pay | Admitting: Internal Medicine

## 2019-09-25 ENCOUNTER — Other Ambulatory Visit: Payer: Self-pay

## 2019-09-25 DIAGNOSIS — M4620 Osteomyelitis of vertebra, site unspecified: Secondary | ICD-10-CM

## 2019-09-25 NOTE — Assessment & Plan Note (Signed)
I believe her infection has been cured through a combination of surgery and prolonged antibiotic therapy.  She can follow-up here as needed.

## 2019-09-25 NOTE — Progress Notes (Signed)
[facility] for Infectious Disease  Patient Active Problem List   Diagnosis Date Noted  . PSP (progressive supranuclear palsy) ([place]) 08/10/2019  . UTI (urinary tract infection) 12/06/2018  . Vertebral osteomyelitis ([place]) 12/05/2018  . Spinal stenosis 12/05/2018  . AKI (acute kidney injury) ([place]) 12/05/2018  . Acute metabolic encephalopathy [id number]  . Chronic diastolic (congestive) heart failure ([place]) 10/07/2018  . GERD (gastroesophageal reflux disease) 10/07/2018  . Abnormal glucose tolerance test 07/01/2018  . Colon, diverticulosis 07/01/2018  . 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 Glaucoma 07/01/2018  . Chronic kidney disease (CKD), stage III (moderate) 07/01/2018  . Neuropathy 07/01/2018  . Pure hypercholesterolemia 07/01/2018  . Restless leg 07/01/2018  . Discitis of thoracolumbar region 07/01/2018  . Acute bronchitis due to infection 05/04/2017  . Cough 04/21/2016  . Preoperative cardiovascular examination 04/18/2015  . Parkinsonism ([place]) 05/15/2014  . Chronic anticoagulation -warfarin therapy 02/03/2014  . Atrial fibrillation with RVR ([place]) 01/31/2014  . Gait instability 11/16/2013  . Edema 03/03/2010  . ATRIAL FIBRILLATION 04/23/2008  . Hyperlipidemia 01/31/2008  . Obstructive sleep apnea 01/31/2008  . GLAUCOMA 01/30/2008  . Seasonal and perennial allergic rhinitis 01/30/2008  . Osteoarthritis 01/30/2008    Patient's Medications  New Prescriptions   No medications on file  Previous Medications   ACETAMINOPHEN (TYLENOL) 325 MG TABLET    Take 650 mg by mouth every 6 (six) hours as needed.   CALCIUM CARBONATE (TUMS - DOSED IN MG ELEMENTAL CALCIUM) 500 MG CHEWABLE TABLET    Chew 1 tablet by mouth 2 (two) times daily. As needed   CARBIDOPA-LEVODOPA (SINEMET IR) 25-100 MG TABLET    Take 2 tablets by mouth 3 (three) times daily.   CHOLECALCIFEROL (HM VITAMIN D3) 100 MCG (4000 UT) CAPS    Take 8,000 Units by mouth daily.   CLONAZEPAM (KLONOPIN) 0.5 MG TABLET    Take  0.5 tablets (0.25 mg total) by mouth at bedtime.   FLECAINIDE (TAMBOCOR) 50 MG TABLET    Take 1 tablet (50 mg total) by mouth 2 (two) times daily.   FUROSEMIDE (LASIX) 20 MG TABLET    Take 1 tablet (20 mg total) by mouth daily.   GABAPENTIN (NEURONTIN) 300 MG CAPSULE    Take 600 mg by mouth at bedtime.    LIVER OIL-ZINC OXIDE (DESITIN) 40 % OINTMENT    Apply 1 application topically as needed for irritation.   MELATONIN 10 MG TABS    Take 10 mg by mouth at bedtime.   METOPROLOL SUCCINATE (TOPROL-XL) 50 MG 24 HR TABLET    Take 1/2 tablet by mouth daily.Take with or immediately following a meal.   MIRABEGRON ER (MYRBETRIQ) 50 MG TB24 TABLET    Take 50 mg by mouth daily.   PANTOPRAZOLE (PROTONIX) 40 MG TABLET    Take 40 mg by mouth 2 (two) times daily.    POLYETHYLENE GLYCOL (MIRALAX / GLYCOLAX) PACKET    Take 17 g by mouth daily.   POTASSIUM CHLORIDE SA (K-DUR) 20 MEQ TABLET    Take 1 tablet (20 mEq total) by mouth daily.   PRAVASTATIN (PRAVACHOL) 40 MG TABLET    Take 40 mg by mouth at bedtime.    RIVAROXABAN (XARELTO) 20 MG TABS TABLET    Take 1 tablet (20 mg total) by mouth daily with supper.   TRAVATAN Z 0.004 % SOLN OPHTHALMIC SOLUTION    Place 1 drop into both eyes at bedtime.   Modified Medications   No medications on file  Discontinued Medications   No medications on file    Subjective: Ms. Brezinski is in for her routine follow-up visit.  She had culture-negative thoracic spine infection diagnosed in April of last year.  She was on chronic doxycycline until 08/10/2019.  Her C-reactive protein and sed rate were normal at that time.  She has not had any increase in back pain suggesting relapse.  Review of Systems: Review of Systems  Constitutional: Negative for fever.  Musculoskeletal: Negative for back pain.    Past Medical History:  Diagnosis Date  . Allergic rhinitis   . Arthritis    "all over"  . CHF (congestive heart failure) ([place])   . Chronic back pain   . DJD (degenerative  joint disease)   . 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 GERD (gastroesophageal reflux disease)   . Hyperlipidemia   . Hypertension   . Lumbar stenosis   . OSA (obstructive sleep apnea)    "couldn't handle CPAP; use mouth guard some; not all the time" (01/06/2014)  . PAF (paroxysmal atrial fibrillation) ([place])   . Scoliosis    with radiculopathy L2-S1 with prior surgery  . Small bowel obstruction (H[facility])    versus ileus after last bck surgery  . Spondylosis     Social History   Tobacco Use  . Smoking status: Never Smoker  . Smokeless tobacco: Never Used  Substance Use Topics  . Alcohol use: No    Alcohol/week: 0.0 standard drinks  . Drug use: No    Family History  Problem Relation Age of Onset  . Arthritis Mother   . Heart attack Father   . Hypertension Sister   . Lung cancer Sister   . Asthma Sister   . Allergies Sister     Allergies  Allergen Reactions  . Lyrica [Pregabalin] Other (See Comments)    Felt loopy  . Zocor [Simvastatin] Other (See Comments)    Myalgias  . Relafen [Nabumetone] Rash    Objective: Vitals:   09/25/19 1044  BP: 105/64  Pulse: 80  Temp: 97.6 F (36.4 C)  SpO2: 98%   There is no height or weight on file to calculate BMI.  Physical Exam Constitutional:      Comments: She is seated in her wheelchair.  Her 2 daughters participated by phone.  She has a right-sided black eye sustained in a fall 1 week ago.  She defers to her daughters to answer my questions.     Lab Results Sed Rate  Date Value  07/31/2019 11 mm/h  12/06/2018 32 mm/hr (H)  09/27/2018 2 mm/hr   CRP  Date Value  07/31/2019 3.0 mg/L  01/11/2019 18.9 mg/L (H)  12/06/2018 7.1 mg/dL (H)     Problem List Items Addressed This Visit      Unprioritized   Vertebral osteomyelitis ([place])    I believe her infection has been cured through a combination of surgery and prolonged antibiotic therapy.  She can follow-up here as needed.          Michel Bickers, MD [facility] [phone] pager   [phone] cell 09/25/2019, 11:01 AM

## 2019-09-26 DIAGNOSIS — N3946 Mixed incontinence: Secondary | ICD-10-CM | POA: Diagnosis not present

## 2019-09-26 DIAGNOSIS — R350 Frequency of micturition: Secondary | ICD-10-CM | POA: Diagnosis not present

## 2019-09-27 DIAGNOSIS — G20 Parkinson's disease: Secondary | ICD-10-CM | POA: Diagnosis not present

## 2019-09-27 DIAGNOSIS — R270 Ataxia, unspecified: Secondary | ICD-10-CM | POA: Diagnosis not present

## 2019-09-27 DIAGNOSIS — M79673 Pain in unspecified foot: Secondary | ICD-10-CM | POA: Diagnosis not present

## 2019-09-27 DIAGNOSIS — Z9989 Dependence on other enabling machines and devices: Secondary | ICD-10-CM | POA: Diagnosis not present

## 2019-09-28 ENCOUNTER — Other Ambulatory Visit: Payer: Self-pay

## 2019-10-02 DIAGNOSIS — M4714 Other spondylosis with myelopathy, thoracic region: Secondary | ICD-10-CM | POA: Diagnosis not present

## 2019-10-09 ENCOUNTER — Telehealth: Payer: Self-pay

## 2019-10-09 MED ORDER — CARBIDOPA-LEVODOPA 25-100 MG PO TABS: 2.0000 | ORAL_TABLET | Freq: Three times a day (TID) | ORAL | 1 refills | Status: DC

## 2019-10-09 NOTE — Telephone Encounter (Signed)
Received a voicemail yesterday from [place] at [facility]. Stating the patient needs a refill on Carbidopa Levodopa 25/100mg . She states the patient takes the rx 2 tabs tid and requests Dr Tat sends over a new rx. She requested a call back at [phone].  Rx(s) sent to pharmacy electronically.   Contacted Pharmacist at [facility] and informed her that the rx for Carbidopa levodopa has been sent over. She thanked me for calling.

## 2019-10-17 DIAGNOSIS — H43811 Vitreous degeneration, right eye: Secondary | ICD-10-CM | POA: Diagnosis not present

## 2019-10-17 DIAGNOSIS — H04123 Dry eye syndrome of bilateral lacrimal glands: Secondary | ICD-10-CM | POA: Diagnosis not present

## 2019-10-17 DIAGNOSIS — H401131 Primary open-angle glaucoma, bilateral, mild stage: Secondary | ICD-10-CM | POA: Diagnosis not present

## 2019-10-29 ENCOUNTER — Telehealth: Payer: Self-pay | Admitting: Neurology

## 2019-10-29 ENCOUNTER — Other Ambulatory Visit: Payer: Self-pay

## 2019-10-29 NOTE — Telephone Encounter (Signed)
Patients daughter has been notified and voiced understanding. Advised patients daughter to address questions at patients next appt. She voiced understanding.

## 2019-10-29 NOTE — Telephone Encounter (Signed)
Patient daughter called and wants to speak to someone about her mother   The caregivers are stating that the patient is dropping cups when they hand them to her. She fell back in March and her voice has changed such as she will start off strong and then it gets weaker and can be hard to understand. She is also having periods of her legs freezing while walking please call  Patient has app in Sept to see Dr Tat

## 2019-10-29 NOTE — Telephone Encounter (Signed)
Unfortunately , this is likely all part of her PSP.  Her daughter wasn't at appt last visit but husband was.

## 2019-11-01 DIAGNOSIS — N3946 Mixed incontinence: Secondary | ICD-10-CM | POA: Diagnosis not present

## 2019-11-01 DIAGNOSIS — R350 Frequency of micturition: Secondary | ICD-10-CM | POA: Diagnosis not present

## 2019-11-09 ENCOUNTER — Ambulatory Visit (INDEPENDENT_AMBULATORY_CARE_PROVIDER_SITE_OTHER): Payer: Medicare Other

## 2019-11-09 ENCOUNTER — Emergency Department (HOSPITAL_COMMUNITY)
Admission: EM | Admit: 2019-11-09 | Discharge: 2019-11-10 | Disposition: A | Discharge status: Home / Self Care | Payer: Medicare Other | Attending: Emergency Medicine | Admitting: Emergency Medicine

## 2019-11-09 ENCOUNTER — Encounter (HOSPITAL_COMMUNITY): Payer: Self-pay | Admitting: Emergency Medicine

## 2019-11-09 ENCOUNTER — Other Ambulatory Visit: Payer: Self-pay

## 2019-11-09 ENCOUNTER — Ambulatory Visit (INDEPENDENT_AMBULATORY_CARE_PROVIDER_SITE_OTHER)
Admission: EM | Admit: 2019-11-09 | Discharge: 2019-11-09 | Disposition: A | Discharge status: Home / Self Care | Payer: Medicare Other | Source: Home / Self Care

## 2019-11-09 ENCOUNTER — Emergency Department (HOSPITAL_COMMUNITY): Payer: Medicare Other

## 2019-11-09 DIAGNOSIS — Y9301 Activity, walking, marching and hiking: Secondary | ICD-10-CM | POA: Insufficient documentation

## 2019-11-09 DIAGNOSIS — M25512 Pain in left shoulder: Secondary | ICD-10-CM | POA: Diagnosis not present

## 2019-11-09 DIAGNOSIS — Y998 Other external cause status: Secondary | ICD-10-CM | POA: Diagnosis not present

## 2019-11-09 DIAGNOSIS — I10 Essential (primary) hypertension: Secondary | ICD-10-CM | POA: Diagnosis not present

## 2019-11-09 DIAGNOSIS — Y929 Unspecified place or not applicable: Secondary | ICD-10-CM | POA: Insufficient documentation

## 2019-11-09 DIAGNOSIS — W19XXXA Unspecified fall, initial encounter: Secondary | ICD-10-CM

## 2019-11-09 DIAGNOSIS — G20 Parkinson's disease: Secondary | ICD-10-CM | POA: Diagnosis not present

## 2019-11-09 DIAGNOSIS — R519 Headache, unspecified: Secondary | ICD-10-CM

## 2019-11-09 DIAGNOSIS — N183 Chronic kidney disease, stage 3 unspecified: Secondary | ICD-10-CM | POA: Insufficient documentation

## 2019-11-09 DIAGNOSIS — I5032 Chronic diastolic (congestive) heart failure: Secondary | ICD-10-CM | POA: Insufficient documentation

## 2019-11-09 DIAGNOSIS — Z79899 Other long term (current) drug therapy: Secondary | ICD-10-CM | POA: Diagnosis not present

## 2019-11-09 DIAGNOSIS — S4992XA Unspecified injury of left shoulder and upper arm, initial encounter: Secondary | ICD-10-CM | POA: Diagnosis not present

## 2019-11-09 DIAGNOSIS — Z7901 Long term (current) use of anticoagulants: Secondary | ICD-10-CM | POA: Insufficient documentation

## 2019-11-09 DIAGNOSIS — I130 Hypertensive heart and chronic kidney disease with heart failure and stage 1 through stage 4 chronic kidney disease, or unspecified chronic kidney disease: Secondary | ICD-10-CM | POA: Insufficient documentation

## 2019-11-09 DIAGNOSIS — S0990XA Unspecified injury of head, initial encounter: Secondary | ICD-10-CM | POA: Diagnosis not present

## 2019-11-09 DIAGNOSIS — W01198A Fall on same level from slipping, tripping and stumbling with subsequent striking against other object, initial encounter: Secondary | ICD-10-CM | POA: Diagnosis not present

## 2019-11-09 DIAGNOSIS — S199XXA Unspecified injury of neck, initial encounter: Secondary | ICD-10-CM | POA: Diagnosis not present

## 2019-11-09 NOTE — ED Provider Notes (Signed)
[facility] EMERGENCY DEPARTMENT Provider Note   CSN: [id number] Arrival date & time: 11/09/19  1953     History Chief Complaint  Patient presents with  . Fall    [name] is a 79 y.o. female.  HPI   Patient presents emergency room for evaluation after head injury.  Patient has history of progressive supranuclear palsy which causes gait disturbance.  Patient normally uses a walker.  She was walking and felt like she was walking too fast and her feet got tangled and she fell.  Patient struck the back of her head.  She also hurt her left shoulder.  Patient went to an urgent care and had an x-ray of her shoulder.  She was instructed come to the ED as she does take Xarelto.  Past Medical History:  Diagnosis Date  . Allergic rhinitis   . Arthritis    "all over"  . CHF (congestive heart failure) ([place])   . Chronic back pain   . DJD (degenerative joint disease)   . 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 GERD (gastroesophageal reflux disease)   . Hyperlipidemia   . Hypertension   . Lumbar stenosis   . OSA (obstructive sleep apnea)    "couldn't handle CPAP; use mouth guard some; not all the time" (01/06/2014)  . PAF (paroxysmal atrial fibrillation) ([place])   . Scoliosis    with radiculopathy L2-S1 with prior surgery  . Small bowel obstruction (H[facility])    versus ileus after last bck surgery  . Spondylosis     Patient Active Problem List   Diagnosis Date Noted  . PSP (progressive supranuclear palsy) ([place]) 08/10/2019  . UTI (urinary tract infection) 12/06/2018  . Vertebral osteomyelitis ([place]) 12/05/2018  . Spinal stenosis 12/05/2018  . AKI (acute kidney injury) ([place]) 12/05/2018  . Acute metabolic encephalopathy 29/79/8921  . Chronic diastolic (congestive) heart failure ([place]) 10/07/2018  . GERD (gastroesophageal reflux disease) 10/07/2018  . Abnormal glucose tolerance test 07/01/2018  . Colon, diverticulosis 07/01/2018  . 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 Glaucoma  07/01/2018  . Chronic kidney disease (CKD), stage III (moderate) 07/01/2018  . Neuropathy 07/01/2018  . Pure hypercholesterolemia 07/01/2018  . Restless leg 07/01/2018  . Discitis of thoracolumbar region 07/01/2018  . Acute bronchitis due to infection 05/04/2017  . Cough 04/21/2016  . Preoperative cardiovascular examination 04/18/2015  . Parkinsonism ([place]) 05/15/2014  . Chronic anticoagulation -warfarin therapy 02/03/2014  . Atrial fibrillation with RVR ([place]) 01/31/2014  . Gait instability 11/16/2013  . Edema 03/03/2010  . ATRIAL FIBRILLATION 04/23/2008  . Hyperlipidemia 01/31/2008  . Obstructive sleep apnea 01/31/2008  . GLAUCOMA 01/30/2008  . Seasonal and perennial allergic rhinitis 01/30/2008  . Osteoarthritis 01/30/2008    Past Surgical History:  Procedure Laterality Date  . BACK SURGERY    . CATARACT EXTRACTION W/ INTRAOCULAR LENS  IMPLANT, BILATERAL  2011  . CHOLECYSTECTOMY N/A 07/07/2018   Procedure: LAPAROSCOPIC CHOLECYSTECTOMY;  Surgeon: Erroll Luna, MD;  Location: [facility];  Service: General;  Laterality: N/A;  . COLONOSCOPY  12/09/2004  . LIPOMA EXCISION  1980's   "fatty tumors"  . [place] SURGERY  02/2009   "ruptured disc"  . NASAL SEPTUM SURGERY  80's  . POSTERIOR LUMBAR FUSION  06/2010; 10/2011   "placed screws, rods, spacers both times"  . POSTERIOR LUMBAR FUSION 4 LEVEL N/A 12/13/2018   Procedure: THORACIC NINE -LUMBAR THREE POSTERIOR INSTRUMENTATION FUSION;  Surgeon: Judith Part, MD;  Location: [facility];  Service: Neurosurgery;  Laterality: N/A;  .  REPAIR DURAL / CSF LEAK  02/2009  . THORACIC LAMINECTOMY FOR EPIDURAL ABSCESS N/A 09/28/2018   Procedure: THORACIC NINE-THORACIC TEN, THORACIC TEN-THORACIC ELEVEN, THORACIC ELEVEN-THORACIC TWELVE LAMINECTOMIES FOR EPIDURAL ABSCESS;  Surgeon: Judith Part, MD;  Location: [facility];  Service: Neurosurgery;  Laterality: N/A;  . TOTAL ABDOMINAL HYSTERECTOMY  02/1993     OB History   No obstetric history on  file.     Family History  Problem Relation Age of Onset  . Arthritis Mother   . Heart attack Father   . Hypertension Sister   . Lung cancer Sister   . Asthma Sister   . Allergies Sister     Social History   Tobacco Use  . Smoking status: Never Smoker  . Smokeless tobacco: Never Used  Substance Use Topics  . Alcohol use: No    Alcohol/week: 0.0 standard drinks  . Drug use: No    Home Medications Prior to Admission medications   Medication Sig Start Date End Date Taking? Authorizing Provider  acetaminophen (TYLENOL) 325 MG tablet Take 325 mg by mouth every 6 (six) hours as needed for headache (pain).    Yes [provider]  carbidopa-levodopa (SINEMET IR) 25-100 MG tablet Take 2 tablets by mouth 3 (three) times daily. Patient taking differently: Take 2 tablets by mouth 3 (three) times daily. 8am, noon and 4pm 10/09/19  Yes Tat, Eustace Quail, DO  Cholecalciferol (HM VITAMIN D3) 50 MCG (2000 UT) CAPS Take 8,000 Units by mouth at bedtime.    Yes [provider]  clonazePAM (KLONOPIN) 0.5 MG tablet Take 0.5 tablets (0.25 mg total) by mouth at bedtime. 08/10/19  Yes Tat, Eustace Quail, DO  DULoxetine (CYMBALTA) 60 MG capsule Take 60 mg by mouth 2 (two) times daily with a meal. 10/25/19  Yes [provider]  flecainide (TAMBOCOR) 50 MG tablet Take 1 tablet (50 mg total) by mouth 2 (two) times daily. Patient taking differently: Take 50 mg by mouth 2 (two) times daily with a meal.  09/11/19  Yes Josue Hector, MD  furosemide (LASIX) 20 MG tablet Take 1 tablet (20 mg total) by mouth daily. 01/01/19 01/29/20 Yes Bhagat, Bhavinkumar, PA  gabapentin (NEURONTIN) 300 MG capsule Take 300-600 mg by mouth See admin instructions. Take one capsule (300 mg) by mouth daily with lunch and supper, take two capsules (600 mg) at bedtime   Yes [provider]  Lactobacillus Rhamnosus, GG, (CULTURELLE PO) Take 1 capsule by mouth daily at 12 noon.   Yes [provider]   Menthol, Topical Analgesic, (BIOFREEZE EX) Apply 1 application topically 2 (two) times daily as needed (pain).   Yes [provider]  metoprolol succinate (TOPROL-XL) 25 MG 24 hr tablet Take 12.5 mg by mouth daily with breakfast. 09/11/19  Yes [provider]  mirabegron ER (MYRBETRIQ) 50 MG TB24 tablet Take 50 mg by mouth daily.   Yes [provider]  pantoprazole (PROTONIX) 40 MG tablet Take 40 mg by mouth daily.    Yes [provider]  polyethylene glycol (MIRALAX / GLYCOLAX) packet Take 17 g by mouth daily. Patient taking differently: Take 17 g by mouth daily as needed (constipation).  07/10/18  Yes Sheikh, Omair Latif, DO  potassium chloride SA (K-DUR) 20 MEQ tablet Take 1 tablet (20 mEq total) by mouth daily. 01/01/19  Yes Bhagat, Bhavinkumar, PA  pravastatin (PRAVACHOL) 40 MG tablet Take 40 mg by mouth daily with lunch.    Yes [provider]  rivaroxaban Alveda Reasons)  20 MG TABS tablet Take 1 tablet (20 mg total) by mouth daily with supper. 07/06/19  Yes Josue Hector, MD  TRAVATAN Z 0.004 % SOLN ophthalmic solution Place 1 drop into both eyes at bedtime.  01/21/15  Yes [provider]  metoprolol succinate (TOPROL-XL) 50 MG 24 hr tablet Take 1/2 tablet by mouth daily.Take with or immediately following a meal. Patient not taking: Reported on 11/09/2019 03/20/19   Josue Hector, MD    Allergies    Lyrica [pregabalin], Zocor [simvastatin], and Relafen [nabumetone]  Review of Systems   Review of Systems  All other systems reviewed and are negative.   Physical Exam Updated Vital Signs BP (!) 172/75 (BP Location: Right Arm)   Pulse 74   Temp 98.5 F (36.9 C) (Oral)   Resp 17   Ht 1.651 m (5\' 5" )   Wt 83.5 kg   SpO2 100%   BMI 30.63 kg/m   Physical Exam Vitals and nursing note reviewed.  Constitutional:      General: She is not in acute distress.    Appearance: She is well-developed.  HENT:     Head: Normocephalic and  atraumatic.     Right Ear: External ear normal.     Left Ear: External ear normal.  Eyes:     General: No scleral icterus.       Right eye: No discharge.        Left eye: No discharge.     Conjunctiva/sclera: Conjunctivae normal.  Neck:     Trachea: No tracheal deviation.  Cardiovascular:     Rate and Rhythm: Normal rate and regular rhythm.  Pulmonary:     Effort: Pulmonary effort is normal. No respiratory distress.     Breath sounds: Normal breath sounds. No stridor. No wheezing or rales.  Abdominal:     General: Bowel sounds are normal. There is no distension.     Palpations: Abdomen is soft.     Tenderness: There is no abdominal tenderness. There is no guarding or rebound.  Musculoskeletal:     Right shoulder: No swelling, tenderness or bony tenderness.     Left shoulder: No swelling, tenderness or bony tenderness.     Right wrist: No swelling, tenderness or bony tenderness.     Left wrist: No swelling, tenderness or bony tenderness.     Cervical back: Neck supple. Tenderness present. No swelling or bony tenderness.     Thoracic back: No swelling, tenderness or bony tenderness.     Lumbar back: No swelling, tenderness or bony tenderness.     Right hip: No tenderness or bony tenderness. Normal range of motion.     Left hip: No tenderness or bony tenderness. Normal range of motion.     Right ankle: No swelling. No tenderness.     Left ankle: No swelling. No tenderness.  Skin:    General: Skin is warm and dry.     Findings: No rash.  Neurological:     Mental Status: She is alert.     Cranial Nerves: No cranial nerve deficit (no facial droop, extraocular movements intact, no slurred speech).     Sensory: No sensory deficit.     Motor: No abnormal muscle tone or seizure activity.     Coordination: Coordination normal.     ED Results / Procedures / Treatments   Labs (all labs ordered are listed, but only abnormal results are displayed) Labs Reviewed - No data to  display  EKG EKG Interpretation  Date/Time:  Friday November 09 2019 20:30:40 EDT Ventricular Rate:  76 PR Interval:    QRS Duration: 96 QT Interval:  361 QTC Calculation: 406 R Axis:   4 Text Interpretation: Sinus rhythm Borderline low voltage, extremity leads No significant change since last tracing Confirmed by Dorie Rank [phone]) on 11/09/2019 8:35:00 PM   Radiology CT Head Wo Contrast  Result Date: 11/09/2019 CLINICAL DATA:  Fall backing striking head.  On anticoagulation. EXAM: CT HEAD WITHOUT CONTRAST TECHNIQUE: Contiguous axial images were obtained from the base of the skull through the vertex without intravenous contrast. COMPARISON:  Head CT 12/04/2018 FINDINGS: Brain: No intracranial hemorrhage, mass effect, or midline shift. Stable degree of atrophy and chronic small vessel ischemia. No hydrocephalus. The basilar cisterns are patent. No evidence of territorial infarct or acute ischemia. No extra-axial or intracranial fluid collection. Vascular: Atherosclerosis of skullbase vasculature without hyperdense vessel or abnormal calcification. Skull: No fracture or focal lesion. Sinuses/Orbits: Paranasal sinuses and mastoid air cells are clear. The visualized orbits are unremarkable. Bilateral cataract resection. Other: None. IMPRESSION: 1. No acute intracranial abnormality. No skull fracture. 2. Stable atrophy and chronic small vessel ischemia. Electronically Signed   By: Keith Rake M.D.   On: 11/09/2019 21:19   CT Cervical Spine Wo Contrast  Result Date: 11/09/2019 CLINICAL DATA:  Fall striking back of head.  On anticoagulation. EXAM: CT CERVICAL SPINE WITHOUT CONTRAST TECHNIQUE: Multidetector CT imaging of the cervical spine was performed without intravenous contrast. Multiplanar CT image reconstructions were also generated. COMPARISON:  12/04/2018 FINDINGS: Alignment: Normal. Skull base and vertebrae: No acute fracture. Vertebral body heights are maintained. The dens and skull base are  intact. Soft tissues and spinal canal: No prevertebral fluid or swelling. No visible canal hematoma. Disc levels: Disc space narrowing and endplate spurring most prominent at C3-C4 causing mass effect on the spinal canal and left neural foramen. Lesser degenerative disc disease in lower cervical spine. Multilevel facet hypertrophy. Upper chest: No acute findings. Heterogeneous thyroid gland without dominant nodule, no dedicated imaging follow-up recommended. Other: Carotid calcifications. IMPRESSION: Degenerative change in the cervical spine without acute fracture or subluxation. Electronically Signed   By: Keith Rake M.D.   On: 11/09/2019 21:23   DG Shoulder Left  Result Date: 11/09/2019 CLINICAL DATA:  Left shoulder pain after fall. Fall backwards onto carpeted surface. EXAM: LEFT SHOULDER - 2+ VIEW COMPARISON:  None. FINDINGS: There is no evidence of fracture or dislocation. Mild acromioclavicular spurring with inferiorly directed osteophytes. Glenohumeral joint is unremarkable. Soft tissue calcification lateral to the humeral head. Included ribs are intact. IMPRESSION: 1. No fracture or subluxation of the left shoulder. 2. Mild acromioclavicular osteoarthritis. Soft tissue calcifications adjacent to the lateral humeral head suggestive of calcific tendinopathy or bursitis. Electronically Signed   By: Keith Rake M.D.   On: 11/09/2019 20:06    Procedures Procedures (including critical care time)  Medications Ordered in ED Medications - No data to display  ED Course  I have reviewed the triage vital signs and the nursing notes.  Pertinent labs & imaging results that were available during my care of the patient were reviewed by me and considered in my medical decision making (see chart for details).    MDM Rules/Calculators/A&P                      Patient presented to the ED for evaluation after a mechanical fall.  Patient's physical exam is otherwise reassuring.  She had mild  tenderness  to left shoulder.  She had an x-ray done at the urgent care that did not show any signs of fracture or dislocation.  Because of her Xarelto use and head injury she had a head CT and cervical spine CT.  No signs of any fracture or internal bleeding.  At this time there does not appear to be any evidence of an acute emergency medical condition and the patient appears stable for discharge with appropriate outpatient follow up.  Final Clinical Impression(s) / ED Diagnoses Final diagnoses:  Injury of head, initial encounter    Rx / DC Orders ED Discharge Orders    None       Dorie Rank, MD 11/09/19 2219

## 2019-11-09 NOTE — Progress Notes (Signed)
Orthopedic Tech Progress Note Patient Details:  [name] Jul 05, 1940 [id number]  Level 2 Trauma  Tammy Sours 11/09/2019, 8:43 PM

## 2019-11-09 NOTE — ED Triage Notes (Signed)
Pt here with the daughter fell backward and hit her lower back and head. Pt is on Xarelto.

## 2019-11-09 NOTE — Discharge Instructions (Addendum)
Take over the counter tylenol as needed for aches and pains, return as needed for worsening symptoms

## 2019-11-09 NOTE — ED Provider Notes (Signed)
[facility]    CSN: [id number] Arrival date & time: 11/09/19  1811      History   Chief Complaint Chief Complaint  Patient presents with  . Fall    HPI CARLTON SWEANEY is a 79 y.o. female with extensive medical history as outlined below including hypertension, DJD, arthritis, CHF, PAF presenting with caregiver for occipital head pain and left shoulder pain s/p fall.  Caregiver provides history: States patient fell around 3:30 this afternoon.  Was unable to get up for about 30 minutes due to lack of assistance.  Denies LOC, memory loss, change in mental status, nausea, vomiting, chest pain, difficulty breathing, new bruises.  No numbness, weakness, myalgias.  Patient is compliant with Xarelto.  No recent medication changes.   Past Medical History:  Diagnosis Date  . Allergic rhinitis   . Arthritis    "all over"  . CHF (congestive heart failure) ([place])   . Chronic back pain   . DJD (degenerative joint disease)   . 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 GERD (gastroesophageal reflux disease)   . Hyperlipidemia   . Hypertension   . Lumbar stenosis   . OSA (obstructive sleep apnea)    "couldn't handle CPAP; use mouth guard some; not all the time" (01/06/2014)  . PAF (paroxysmal atrial fibrillation) ([place])   . Scoliosis    with radiculopathy L2-S1 with prior surgery  . Small bowel obstruction ([facility])    versus ileus after last bck surgery  . Spondylosis     Patient Active Problem List   Diagnosis Date Noted  . 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 UTI (urinary tract infection) 12/06/2018  . Vertebral osteomyelitis ([place]) 12/05/2018  . Spinal stenosis 12/05/2018  . AKI (acute kidney injury) ([place]) 12/05/2018  . Acute metabolic encephalopathy 89/21/1939  . Chronic diastolic (congestive) heart failure ([place]) 10/07/2018  . GERD (gastroesophageal reflux disease) 10/07/2018  . Abnormal glucose tolerance test 07/01/2018  . Colon, diverticulosis 07/01/2018  .  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 Glaucoma 07/01/2018  . Chronic kidney disease (CKD), stage III (moderate) 07/01/2018  . Neuropathy 07/01/2018  . Pure hypercholesterolemia 07/01/2018  . Restless leg 07/01/2018  . Discitis of thoracolumbar region 07/01/2018  . Acute bronchitis due to infection 05/04/2017  . Cough 04/21/2016  . Preoperative cardiovascular examination 04/18/2015  . Parkinsonism ([place]) 05/15/2014  . Chronic anticoagulation -warfarin therapy 02/03/2014  . Atrial fibrillation with RVR ([place]) 01/31/2014  . Gait instability 11/16/2013  . Edema 03/03/2010  . ATRIAL FIBRILLATION 04/23/2008  . Hyperlipidemia 01/31/2008  . Obstructive sleep apnea 01/31/2008  . GLAUCOMA 01/30/2008  . Seasonal and perennial allergic rhinitis 01/30/2008  . Osteoarthritis 01/30/2008    Past Surgical History:  Procedure Laterality Date  . BACK SURGERY    . CATARACT EXTRACTION W/ INTRAOCULAR LENS  IMPLANT, BILATERAL  2011  . CHOLECYSTECTOMY N/A 07/07/2018   Procedure: LAPAROSCOPIC CHOLECYSTECTOMY;  Surgeon: Erroll Luna, MD;  Location: [place];  Service: General;  Laterality: N/A;  . COLONOSCOPY  12/09/2004  . LIPOMA EXCISION  1980's   "fatty tumors"  . [place] SURGERY  02/2009   "ruptured disc"  . NASAL SEPTUM SURGERY  80's  . POSTERIOR LUMBAR FUSION  06/2010; 10/2011   "placed screws, rods, spacers both times"  . POSTERIOR LUMBAR FUSION 4 LEVEL N/A 12/13/2018   Procedure: THORACIC NINE -LUMBAR THREE POSTERIOR INSTRUMENTATION FUSION;  Surgeon: Judith Part, MD;  Location: [place];  Service: Neurosurgery;  Laterality: N/A;  . REPAIR DURAL / CSF LEAK  02/2009  . THORACIC LAMINECTOMY FOR EPIDURAL ABSCESS N/A 09/28/2018   Procedure: THORACIC NINE-THORACIC TEN, THORACIC TEN-THORACIC ELEVEN, THORACIC ELEVEN-THORACIC TWELVE LAMINECTOMIES FOR EPIDURAL ABSCESS;  Surgeon: Judith Part, MD;  Location: [place];  Service: Neurosurgery;  Laterality: N/A;  . TOTAL ABDOMINAL HYSTERECTOMY  02/1993    OB History     No obstetric history on file.      Home Medications    Prior to Admission medications   Medication Sig Start Date End Date Taking? Authorizing Provider  acetaminophen (TYLENOL) 325 MG tablet Take 325 mg by mouth every 6 (six) hours as needed for headache (pain).     [provider]  carbidopa-levodopa (SINEMET IR) 25-100 MG tablet Take 2 tablets by mouth 3 (three) times daily. Patient taking differently: Take 2 tablets by mouth 3 (three) times daily. 8am, noon and 4pm 10/09/19   Tat, Eustace Quail, DO  Cholecalciferol (HM VITAMIN D3) 50 MCG (2000 UT) CAPS Take 8,000 Units by mouth at bedtime.     [provider]  clonazePAM (KLONOPIN) 0.5 MG tablet Take 0.5 tablets (0.25 mg total) by mouth at bedtime. 08/10/19   Tat, Eustace Quail, DO  DULoxetine (CYMBALTA) 60 MG capsule Take 60 mg by mouth 2 (two) times daily with a meal. 10/25/19   [provider]  flecainide (TAMBOCOR) 50 MG tablet Take 1 tablet (50 mg total) by mouth 2 (two) times daily. Patient taking differently: Take 50 mg by mouth 2 (two) times daily with a meal.  09/11/19   Josue Hector, MD  furosemide (LASIX) 20 MG tablet Take 1 tablet (20 mg total) by mouth daily. 01/01/19 01/29/20  Leanor Kail, PA  gabapentin (NEURONTIN) 300 MG capsule Take 300-600 mg by mouth See admin instructions. Take one capsule (300 mg) by mouth daily with lunch and supper, take two capsules (600 mg) at bedtime    [provider]  Lactobacillus Rhamnosus, GG, (CULTURELLE PO) Take 1 capsule by mouth daily at 12 noon.    [provider]  Menthol, Topical Analgesic, (BIOFREEZE EX) Apply 1 application topically 2 (two) times daily as needed (pain).    [provider]  metoprolol succinate (TOPROL-XL) 25 MG 24 hr tablet Take 12.5 mg by mouth daily with breakfast. 09/11/19   [provider]  metoprolol succinate (TOPROL-XL) 50 MG 24 hr tablet Take 1/2 tablet by mouth daily.Take with or immediately following a  meal. Patient not taking: Reported on 11/09/2019 03/20/19   Josue Hector, MD  mirabegron ER (MYRBETRIQ) 50 MG TB24 tablet Take 50 mg by mouth daily.    [provider]  pantoprazole (PROTONIX) 40 MG tablet Take 40 mg by mouth daily.     [provider]  polyethylene glycol (MIRALAX / GLYCOLAX) packet Take 17 g by mouth daily. Patient taking differently: Take 17 g by mouth daily as needed (constipation).  07/10/18   Raiford Noble Latif, DO  potassium chloride SA (K-DUR) 20 MEQ tablet Take 1 tablet (20 mEq total) by mouth daily. 01/01/19   Bhagat, Crista Luria, PA  pravastatin (PRAVACHOL) 40 MG tablet Take 40 mg by mouth daily with lunch.     [provider]  rivaroxaban (XARELTO) 20 MG TABS tablet Take 1 tablet (20 mg total) by mouth daily with supper. 07/06/19   Josue Hector, MD  TRAVATAN Z 0.004 % SOLN ophthalmic solution Place 1 drop into both eyes at bedtime.  01/21/15   [provider]    Family History Family History  Problem Relation Age of Onset  . Arthritis Mother   . Heart attack Father   . Hypertension Sister   . Lung cancer Sister   . Asthma Sister   . Allergies Sister     Social History Social History   Tobacco Use  . Smoking status: Never Smoker  . Smokeless tobacco: Never Used  Substance Use Topics  . Alcohol use: No    Alcohol/week: 0.0 standard drinks  . Drug use: No     Allergies   Lyrica [pregabalin], Zocor [simvastatin], and Relafen [nabumetone]   Review of Systems As per HPI   Physical Exam Triage Vital Signs ED Triage Vitals  Enc Vitals Group     BP      Pulse      Resp      Temp      Temp src      SpO2      Weight      Height      Head Circumference      Peak Flow      Pain Score      Pain Loc      Pain Edu?      Excl. in [place]?    No data found.  Updated Vital Signs BP (!) 172/89 (BP Location: Left Arm)   Pulse 81   Temp 98 F (36.7 C) (Oral)   Resp 18   SpO2 92%   Visual Acuity Right Eye  Distance:   Left Eye Distance:   Bilateral Distance:    Right Eye Near:   Left Eye Near:    Bilateral Near:     Physical Exam Constitutional:      General: She is not in acute distress.    Appearance: She is not ill-appearing.  HENT:     Head: Normocephalic and atraumatic.     Right Ear: Tympanic membrane, ear canal and external ear normal.     Left Ear: Tympanic membrane, ear canal and external ear normal.     Ears:     Comments: Negative hemotympanum bilaterally    Mouth/Throat:     Mouth: Mucous membranes are moist.     Pharynx: Oropharynx is clear.  Eyes:     General: No scleral icterus.    Extraocular Movements: Extraocular movements intact.     Conjunctiva/sclera: Conjunctivae normal.     Pupils: Pupils are equal, round, and reactive to light.  Neck:     Comments: Trachea midline, negative JVD Cardiovascular:     Rate and Rhythm: Normal rate and regular rhythm.     Heart sounds: Murmur present.  Pulmonary:     Effort: Pulmonary effort is normal. No respiratory distress.     Breath sounds: No wheezing or rales.  Musculoskeletal:        General: Tenderness present. No swelling. Normal range of motion.     Cervical back: Normal range of motion and neck supple. No tenderness.     Comments: Patient with deltoid and AC joint tenderness of left shoulder.  Lymphadenopathy:     Cervical: No cervical adenopathy.  Skin:    Capillary Refill: Capillary refill takes less than 2 seconds.     Coloration: Skin is not jaundiced or pale.  Neurological:     General: No focal deficit present.     Mental Status: She is alert and oriented to person, place, and time.      UC Treatments / Results  Labs (all labs ordered are listed, but only abnormal  results are displayed) Labs Reviewed - No data to display  EKG   Radiology DG Shoulder Left  Result Date: 11/09/2019 CLINICAL DATA:  Left shoulder pain after fall. Fall backwards onto carpeted surface. EXAM: LEFT SHOULDER - 2+  VIEW COMPARISON:  None. FINDINGS: There is no evidence of fracture or dislocation. Mild acromioclavicular spurring with inferiorly directed osteophytes. Glenohumeral joint is unremarkable. Soft tissue calcification lateral to the humeral head. Included ribs are intact. IMPRESSION: 1. No fracture or subluxation of the left shoulder. 2. Mild acromioclavicular osteoarthritis. Soft tissue calcifications adjacent to the lateral humeral head suggestive of calcific tendinopathy or bursitis. Electronically Signed   By: Keith Rake M.D.   On: 11/09/2019 20:06   Procedures Procedures (including critical care time)  Medications Ordered in UC Medications - No data to display  Initial Impression / Assessment and Plan / UC Course  I have reviewed the triage vital signs and the nursing notes.  Pertinent labs & imaging results that were available during my care of the patient were reviewed by me and considered in my medical decision making (see chart for details).     Patient afebrile, nontoxic, and without neurocognitive deficit on exam today.  Discussed risk of intracranial hemorrhage in setting of geriatric patient on blood thinners.  Patient caregiver verbalized understanding: Would like to defer ER visit at this time, requesting shoulder x-ray.  Left shoulder x-ray done office, reviewed by me radiology: Significant for mild AC OA with soft tissue calcifications adjacent to the lateral humeral head suggestive of calcific tendinopathy or bursitis.  No fracture, subluxation.    Prior to x-ray being read by radiology patient's daughter arrived to clinic: Brought patient in stable condition to ER for further evaluation given head trauma. Final Clinical Impressions(s) / UC Diagnoses   Final diagnoses:  Fall, initial encounter  Occipital headache  Acute pain of left shoulder   Discharge Instructions   None    ED Prescriptions    None     PDMP not reviewed this encounter.   [place],  [place], [place] 11/10/19 [id number]

## 2019-11-09 NOTE — ED Triage Notes (Signed)
Pt states she got to walking to fast and feet got tangled, fell straight back on the carpet floor hitting the back of her head. Denies LOC. C/o pain to back of head and lt shoulder.

## 2019-11-09 NOTE — Progress Notes (Signed)
Chaplain engaged in initial visit with [name] and her daughter.  Chaplain offered support.  Chaplain will follow-up as needed.

## 2019-11-20 DIAGNOSIS — R350 Frequency of micturition: Secondary | ICD-10-CM | POA: Diagnosis not present

## 2019-11-27 DIAGNOSIS — R350 Frequency of micturition: Secondary | ICD-10-CM | POA: Diagnosis not present

## 2019-11-28 DIAGNOSIS — G629 Polyneuropathy, unspecified: Secondary | ICD-10-CM | POA: Diagnosis not present

## 2019-11-28 DIAGNOSIS — Z7901 Long term (current) use of anticoagulants: Secondary | ICD-10-CM | POA: Diagnosis not present

## 2019-11-28 DIAGNOSIS — D631 Anemia in chronic kidney disease: Secondary | ICD-10-CM | POA: Diagnosis not present

## 2019-11-28 DIAGNOSIS — F0280 Dementia in other diseases classified elsewhere without behavioral disturbance: Secondary | ICD-10-CM | POA: Diagnosis not present

## 2019-11-28 DIAGNOSIS — I130 Hypertensive heart and chronic kidney disease with heart failure and stage 1 through stage 4 chronic kidney disease, or unspecified chronic kidney disease: Secondary | ICD-10-CM | POA: Diagnosis not present

## 2019-11-28 DIAGNOSIS — K219 Gastro-esophageal reflux disease without esophagitis: Secondary | ICD-10-CM | POA: Diagnosis not present

## 2019-11-28 DIAGNOSIS — F419 Anxiety disorder, unspecified: Secondary | ICD-10-CM | POA: Diagnosis not present

## 2019-11-28 DIAGNOSIS — I4891 Unspecified atrial fibrillation: Secondary | ICD-10-CM | POA: Diagnosis not present

## 2019-11-28 DIAGNOSIS — G20 Parkinson's disease: Secondary | ICD-10-CM | POA: Diagnosis not present

## 2019-11-28 DIAGNOSIS — N189 Chronic kidney disease, unspecified: Secondary | ICD-10-CM | POA: Diagnosis not present

## 2019-11-28 DIAGNOSIS — M4714 Other spondylosis with myelopathy, thoracic region: Secondary | ICD-10-CM | POA: Diagnosis not present

## 2019-11-28 DIAGNOSIS — G4700 Insomnia, unspecified: Secondary | ICD-10-CM | POA: Diagnosis not present

## 2019-11-28 DIAGNOSIS — G4733 Obstructive sleep apnea (adult) (pediatric): Secondary | ICD-10-CM | POA: Diagnosis not present

## 2019-11-28 DIAGNOSIS — I509 Heart failure, unspecified: Secondary | ICD-10-CM | POA: Diagnosis not present

## 2019-12-04 DIAGNOSIS — R350 Frequency of micturition: Secondary | ICD-10-CM | POA: Diagnosis not present

## 2019-12-11 DIAGNOSIS — R350 Frequency of micturition: Secondary | ICD-10-CM | POA: Diagnosis not present

## 2019-12-17 ENCOUNTER — Emergency Department (HOSPITAL_COMMUNITY)
Admission: EM | Admit: 2019-12-17 | Discharge: 2019-12-17 | Disposition: A | Discharge status: Home / Self Care | Payer: Medicare Other | Attending: Emergency Medicine | Admitting: Emergency Medicine

## 2019-12-17 ENCOUNTER — Emergency Department (HOSPITAL_COMMUNITY): Payer: Medicare Other

## 2019-12-17 ENCOUNTER — Encounter (HOSPITAL_COMMUNITY): Payer: Self-pay

## 2019-12-17 ENCOUNTER — Other Ambulatory Visit: Payer: Self-pay

## 2019-12-17 DIAGNOSIS — M24011 Loose body in right shoulder: Secondary | ICD-10-CM | POA: Diagnosis not present

## 2019-12-17 DIAGNOSIS — R519 Headache, unspecified: Secondary | ICD-10-CM | POA: Diagnosis not present

## 2019-12-17 DIAGNOSIS — Z7901 Long term (current) use of anticoagulants: Secondary | ICD-10-CM | POA: Insufficient documentation

## 2019-12-17 DIAGNOSIS — Y92511 Restaurant or cafe as the place of occurrence of the external cause: Secondary | ICD-10-CM | POA: Insufficient documentation

## 2019-12-17 DIAGNOSIS — W19XXXA Unspecified fall, initial encounter: Secondary | ICD-10-CM

## 2019-12-17 DIAGNOSIS — S0990XA Unspecified injury of head, initial encounter: Secondary | ICD-10-CM | POA: Insufficient documentation

## 2019-12-17 DIAGNOSIS — Y998 Other external cause status: Secondary | ICD-10-CM | POA: Diagnosis not present

## 2019-12-17 DIAGNOSIS — I10 Essential (primary) hypertension: Secondary | ICD-10-CM | POA: Diagnosis not present

## 2019-12-17 DIAGNOSIS — Y9301 Activity, walking, marching and hiking: Secondary | ICD-10-CM | POA: Diagnosis not present

## 2019-12-17 DIAGNOSIS — S4991XA Unspecified injury of right shoulder and upper arm, initial encounter: Secondary | ICD-10-CM | POA: Diagnosis not present

## 2019-12-17 DIAGNOSIS — W01198A Fall on same level from slipping, tripping and stumbling with subsequent striking against other object, initial encounter: Secondary | ICD-10-CM | POA: Diagnosis not present

## 2019-12-17 DIAGNOSIS — N183 Chronic kidney disease, stage 3 unspecified: Secondary | ICD-10-CM | POA: Insufficient documentation

## 2019-12-17 DIAGNOSIS — I5032 Chronic diastolic (congestive) heart failure: Secondary | ICD-10-CM | POA: Diagnosis not present

## 2019-12-17 DIAGNOSIS — M160 Bilateral primary osteoarthritis of hip: Secondary | ICD-10-CM | POA: Diagnosis not present

## 2019-12-17 DIAGNOSIS — S199XXA Unspecified injury of neck, initial encounter: Secondary | ICD-10-CM | POA: Diagnosis not present

## 2019-12-17 DIAGNOSIS — G20 Parkinson's disease: Secondary | ICD-10-CM | POA: Diagnosis not present

## 2019-12-17 DIAGNOSIS — Z79899 Other long term (current) drug therapy: Secondary | ICD-10-CM | POA: Insufficient documentation

## 2019-12-17 DIAGNOSIS — S59911A Unspecified injury of right forearm, initial encounter: Secondary | ICD-10-CM | POA: Diagnosis not present

## 2019-12-17 DIAGNOSIS — I130 Hypertensive heart and chronic kidney disease with heart failure and stage 1 through stage 4 chronic kidney disease, or unspecified chronic kidney disease: Secondary | ICD-10-CM | POA: Insufficient documentation

## 2019-12-17 DIAGNOSIS — S3993XA Unspecified injury of pelvis, initial encounter: Secondary | ICD-10-CM | POA: Diagnosis not present

## 2019-12-17 LAB — BASIC METABOLIC PANEL
Anion gap: 10 (ref 5–15)
BUN: 15 mg/dL (ref 8–23)
CO2: 27 mmol/L (ref 22–32)
Calcium: 9.1 mg/dL (ref 8.9–10.3)
Chloride: 105 mmol/L (ref 98–111)
Creatinine, Ser: 1.08 mg/dL — ABNORMAL HIGH (ref 0.44–1.00)
GFR calc Af Amer: 57 mL/min — ABNORMAL LOW (ref >60)
GFR calc non Af Amer: 49 mL/min — ABNORMAL LOW (ref >60)
Glucose, Bld: 88 mg/dL (ref 70–99)
Potassium: 4.8 mmol/L (ref 3.5–5.1)
Sodium: 142 mmol/L (ref 135–145)

## 2019-12-17 LAB — CBC WITH DIFFERENTIAL/PLATELET
Abs Immature Granulocytes: 0.02 10*3/uL (ref 0.00–0.07)
Basophils Absolute: 0 10*3/uL (ref 0.0–0.1)
Basophils Relative: 1 %
Eosinophils Absolute: 0.1 10*3/uL (ref 0.0–0.5)
Eosinophils Relative: 3 %
HCT: 37.9 % (ref 36.0–46.0)
Hemoglobin: 11.8 g/dL — ABNORMAL LOW (ref 12.0–15.0)
Immature Granulocytes: 0 %
Lymphocytes Relative: 17 %
Lymphs Abs: 0.9 10*3/uL (ref 0.7–4.0)
MCH: 27.8 pg (ref 26.0–34.0)
MCHC: 31.1 g/dL (ref 30.0–36.0)
MCV: 89.2 fL (ref 80.0–100.0)
Monocytes Absolute: 0.6 10*3/uL (ref 0.1–1.0)
Monocytes Relative: 11 %
Neutro Abs: 3.5 10*3/uL (ref 1.7–7.7)
Neutrophils Relative %: 68 %
Platelets: 152 10*3/uL (ref 150–400)
RBC: 4.25 MIL/uL (ref 3.87–5.11)
RDW: 15.7 % — ABNORMAL HIGH (ref 11.5–15.5)
WBC: 5.2 10*3/uL (ref 4.0–10.5)
nRBC: 0 % (ref 0.0–0.2)

## 2019-12-17 LAB — URINALYSIS, ROUTINE W REFLEX MICROSCOPIC
Bilirubin Urine: NEGATIVE
Glucose, UA: NEGATIVE mg/dL
Hgb urine dipstick: NEGATIVE
Ketones, ur: NEGATIVE mg/dL
Leukocytes,Ua: NEGATIVE
Nitrite: NEGATIVE
Protein, ur: NEGATIVE mg/dL
Specific Gravity, Urine: 1.01 (ref 1.005–1.030)
pH: 5.5 (ref 5.0–8.0)

## 2019-12-17 MED ORDER — ACETAMINOPHEN 325 MG PO TABS
650.0000 mg | ORAL_TABLET | Freq: Once | ORAL | Status: AC
  Administered 2019-12-17: 650 mg via ORAL
  Filled 2019-12-17: qty 2

## 2019-12-17 MED ORDER — ACETAMINOPHEN ER 650 MG PO TBCR
650.0000 mg | EXTENDED_RELEASE_TABLET | Freq: Three times a day (TID) | ORAL | 0 refills | Status: DC | PRN
Start: 2019-12-17 — End: 2023-09-07

## 2019-12-17 NOTE — ED Triage Notes (Signed)
Pt reports mechanical fall around 1230 today, pt fell backwards and hit her head, pt taking xarelto. Pt c.o right shoulder pain, no other injuries. Pt a.o

## 2019-12-17 NOTE — ED Notes (Signed)
C collar removed per PA instruction.

## 2019-12-17 NOTE — ED Notes (Signed)
Patient verbalizes understanding of discharge instructions. Opportunity for questioning and answers were provided. Armband removed by staff, pt discharged from ED and returned to lobby via wheelchair to go home with family.

## 2019-12-17 NOTE — Discharge Instructions (Signed)
As discussed, all of your images and labs were unremarkable for any acute abnormalities.  Please speak to your cardiologist and let them know that you have been having frequent falls given that you are on a blood thinner.  I am sending home with Tylenol.  Take as needed for pain.  Please follow-up with your PCP within the next week for further evaluation.  Return to the ER for new or worsening symptoms.

## 2019-12-17 NOTE — ED Provider Notes (Signed)
[facility] DEPART[facility] Provider Note   CSN: [id number] Arrival date & time: 12/17/19  1425     History Chief Complaint  Patient presents with  . Fall    [name] is a 79 y.o. female with a past medical history significant for congestive heart failure, chronic low back pain, GERD, hyperlipidemia, hypertension, scoliosis, history of small bowel obstruction, paroxysmal A. fib on chronic Xarelto who presents to the ED after mechanical fall that occurred just prior to arrival.  Patient states she was going to the bathroom at [facility] tripped and fell falling backwards and hitting the posterior aspect of her head.  Denies loss of consciousness.  Denies nausea and vomiting.  Denies visual changes.  Patient admits to a 4/10 headache in the posterior aspect of her head.  She also admits to anterior right shoulder pain, worse with movement.  No treatment prior to arrival.  Denies any preceding chest pain or shortness of breath. She has a history of a left foot drop and currently has a brace on her left lower extremity.  Denies changes to speech, unilateral weakness, facial droop, numbness/tingling, abdominal pain, shortness of breath, chest pain.  History obtained from patient, daughter at bedside, and past medical records. No interpreter used during encounter.      Past Medical History:  Diagnosis Date  . Allergic rhinitis   . Arthritis    "all over"  . CHF (congestive heart failure) ([place])   . Chronic back pain   . DJD (degenerative joint disease)   . 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 GERD (gastroesophageal reflux disease)   . Hyperlipidemia   . Hypertension   . Lumbar stenosis   . OSA (obstructive sleep apnea)    "couldn't handle CPAP; use mouth guard some; not all the time" (01/06/2014)  . PAF (paroxysmal atrial fibrillation) ([place])   . Scoliosis    with radiculopathy L2-S1 with prior surgery  . Small bowel obstruction (H[facility])    versus ileus after last  bck surgery  . Spondylosis     Patient Active Problem List   Diagnosis Date Noted  . 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 UTI (urinary tract infection) 12/06/2018  . Vertebral osteomyelitis ([place]) 12/05/2018  . Spinal stenosis 12/05/2018  . AKI (acute kidney injury) ([place]) 12/05/2018  . Acute metabolic encephalopathy 84/16/6063  . Chronic diastolic (congestive) heart failure ([place]) 10/07/2018  . GERD (gastroesophageal reflux disease) 10/07/2018  . Abnormal glucose tolerance test 07/01/2018  . Colon, diverticulosis 07/01/2018  . 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 Glaucoma 07/01/2018  . Chronic kidney disease (CKD), stage III (moderate) 07/01/2018  . Neuropathy 07/01/2018  . Pure hypercholesterolemia 07/01/2018  . Restless leg 07/01/2018  . Discitis of thoracolumbar region 07/01/2018  . Acute bronchitis due to infection 05/04/2017  . Cough 04/21/2016  . Preoperative cardiovascular examination 04/18/2015  . Parkinsonism ([place]) 05/15/2014  . Chronic anticoagulation -warfarin therapy 02/03/2014  . Atrial fibrillation with RVR ([place]) 01/31/2014  . Gait instability 11/16/2013  . Edema 03/03/2010  . ATRIAL FIBRILLATION 04/23/2008  . Hyperlipidemia 01/31/2008  . Obstructive sleep apnea 01/31/2008  . GLAUCOMA 01/30/2008  . Seasonal and perennial allergic rhinitis 01/30/2008  . Osteoarthritis 01/30/2008    Past Surgical History:  Procedure Laterality Date  . BACK SURGERY    . CATARACT EXTRACTION W/ INTRAOCULAR LENS  IMPLANT, BILATERAL  2011  . CHOLECYSTECTOMY N/A 07/07/2018   Procedure: LAPAROSCOPIC CHOLECYSTECTOMY;  Surgeon: Erroll Luna, MD;  Location: [facility];  Service: General;  Laterality:  N/A;  . COLONOSCOPY  12/09/2004  . LIPOMA EXCISION  1980's   "fatty tumors"  . [place] SURGERY  02/2009   "ruptured disc"  . NASAL SEPTUM SURGERY  80's  . POSTERIOR LUMBAR FUSION  06/2010; 10/2011   "placed screws, rods, spacers both times"  . POSTERIOR LUMBAR FUSION 4 LEVEL N/A  12/13/2018   Procedure: THORACIC NINE -LUMBAR THREE POSTERIOR INSTRUMENTATION FUSION;  Surgeon: Judith Part, MD;  Location: [facility];  Service: Neurosurgery;  Laterality: N/A;  . REPAIR DURAL / CSF LEAK  02/2009  . THORACIC LAMINECTOMY FOR EPIDURAL ABSCESS N/A 09/28/2018   Procedure: THORACIC NINE-THORACIC TEN, THORACIC TEN-THORACIC ELEVEN, THORACIC ELEVEN-THORACIC TWELVE LAMINECTOMIES FOR EPIDURAL ABSCESS;  Surgeon: Judith Part, MD;  Location: [facility];  Service: Neurosurgery;  Laterality: N/A;  . TOTAL ABDOMINAL HYSTERECTOMY  02/1993     OB History   No obstetric history on file.     Family History  Problem Relation Age of Onset  . Arthritis Mother   . Heart attack Father   . Hypertension Sister   . Lung cancer Sister   . Asthma Sister   . Allergies Sister     Social History   Tobacco Use  . Smoking status: Never Smoker  . Smokeless tobacco: Never Used  Vaping Use  . Vaping Use: Never used  Substance Use Topics  . Alcohol use: No    Alcohol/week: 0.0 standard drinks  . Drug use: No    Home Medications Prior to Admission medications   Medication Sig Start Date End Date Taking? Authorizing Provider  acetaminophen (TYLENOL) 325 MG tablet Take 325 mg by mouth every 6 (six) hours as needed for headache (pain).    Yes [provider]  carbidopa-levodopa (SINEMET IR) 25-100 MG tablet Take 2 tablets by mouth 3 (three) times daily. Patient taking differently: Take 2 tablets by mouth 3 (three) times daily. 8am, noon and 4pm 10/09/19  Yes Tat, Eustace Quail, DO  Cholecalciferol (HM VITAMIN D3) 50 MCG (2000 UT) CAPS Take 8,000 Units by mouth at bedtime.    Yes [provider]  clonazePAM (KLONOPIN) 0.5 MG tablet Take 0.5 tablets (0.25 mg total) by mouth at bedtime. 08/10/19  Yes Tat, Eustace Quail, DO  DULoxetine (CYMBALTA) 60 MG capsule Take 60 mg by mouth 2 (two) times daily with a meal. 10/25/19  Yes [provider]  flecainide (TAMBOCOR) 50 MG tablet Take 1  tablet (50 mg total) by mouth 2 (two) times daily. Patient taking differently: Take 50 mg by mouth 2 (two) times daily with a meal.  09/11/19  Yes Josue Hector, MD  furosemide (LASIX) 20 MG tablet Take 1 tablet (20 mg total) by mouth daily. 01/01/19 01/29/20 Yes Bhagat, Bhavinkumar, PA  gabapentin (NEURONTIN) 300 MG capsule Take 300-600 mg by mouth See admin instructions. Take one capsule (300 mg) by mouth daily with lunch and supper, take two capsules (600 mg) at bedtime   Yes [provider]  Menthol, Topical Analgesic, (BIOFREEZE EX) Apply 1 application topically 2 (two) times daily as needed (pain).   Yes [provider]  metoprolol succinate (TOPROL-XL) 50 MG 24 hr tablet Take 1/2 tablet by mouth daily.Take with or immediately following a meal. 03/20/19  Yes Josue Hector, MD  mirabegron ER (MYRBETRIQ) 50 MG TB24 tablet Take 50 mg by mouth daily.   Yes [provider]  pantoprazole (PROTONIX) 40 MG tablet Take 40 mg by mouth daily.    Yes [provider]  polyethylene glycol (MIRALAX / GLYCOLAX) packet Take 17 g by mouth daily. Patient taking differently: Take 17 g by mouth daily as needed (constipation).  07/10/18  Yes Sheikh, Omair Latif, DO  potassium chloride SA (K-DUR) 20 MEQ tablet Take 1 tablet (20 mEq total) by mouth daily. 01/01/19  Yes Bhagat, Bhavinkumar, PA  pravastatin (PRAVACHOL) 40 MG tablet Take 40 mg by mouth daily with lunch.    Yes [provider]  rivaroxaban (XARELTO) 20 MG TABS tablet Take 1 tablet (20 mg total) by mouth daily with supper. 07/06/19  Yes Josue Hector, MD  TRAVATAN Z 0.004 % SOLN ophthalmic solution Place 1 drop into both eyes at bedtime.  01/21/15  Yes [provider]  acetaminophen (TYLENOL 8 HOUR) 650 MG CR tablet Take 1 tablet (650 mg total) by mouth every 8 (eight) hours as needed for pain. 12/17/19   Suzy Bouchard, PA-C    Allergies    Lyrica [pregabalin], Zocor [simvastatin], and Relafen  [nabumetone]  Review of Systems   Review of Systems  Constitutional: Negative for chills and fever.  Respiratory: Negative for shortness of breath.   Cardiovascular: Negative for chest pain.  Gastrointestinal: Negative for abdominal pain.  Musculoskeletal: Positive for neck pain. Negative for back pain.  Neurological: Positive for headaches. Negative for dizziness, facial asymmetry, speech difficulty, weakness and numbness.  All other systems reviewed and are negative.   Physical Exam Updated Vital Signs BP (!) 167/90   Pulse 89   Temp 98.4 F (36.9 C) (Oral)   Resp 18   SpO2 99%   Physical Exam Vitals and nursing note reviewed.  Constitutional:      General: She is not in acute distress.    Appearance: She is not toxic-appearing.  HENT:     Head: Normocephalic.     Comments: No battle sign, raccoon eyes, hemotympanum, malocclusion, or septal hematomas.  Mild tenderness on right posterior aspect of head.  No hematoma.  No lacerations.  No skull deformity. Eyes:     Pupils: Pupils are equal, round, and reactive to light.  Neck:     Comments: Mild cervical midline tenderness.  C-collar placed. Cardiovascular:     Rate and Rhythm: Normal rate and regular rhythm.     Pulses: Normal pulses.     Heart sounds: Normal heart sounds. No murmur heard.  No friction rub. No gallop.   Pulmonary:     Effort: Pulmonary effort is normal.     Breath sounds: Normal breath sounds.  Abdominal:     General: Abdomen is flat. There is no distension.     Palpations: Abdomen is soft.     Tenderness: There is no abdominal tenderness. There is no guarding or rebound.  Musculoskeletal:     Cervical back: Neck supple.     Comments: No thoracic or lumbar midline tenderness.  Tenderness palpation over anterior aspect of right shoulder without crepitus or deformity.  Full range of motion of right shoulder.  No erythema, edema, or warmth.  Right upper extremity neurovascularly intact with soft  compartments. Brace on right leg due to right drop foot.   Skin:    General: Skin is warm and dry.  Neurological:     General: No focal deficit present.     Mental Status: She is alert.     Comments: Speech is clear, able to follow commands CN III-XII intact Normal strength in upper and lower extremities bilaterally including dorsiflexion and plantar flexion, strong and equal grip strength  Sensation grossly intact throughout Moves extremities without ataxia, coordination intact No pronator drift   Psychiatric:        Mood and Affect: Mood normal.        Behavior: Behavior normal.      ED Results / Procedures / Treatments   Labs (all labs ordered are listed, but only abnormal results are displayed) Labs Reviewed  CBC WITH DIFFERENTIAL/PLATELET - Abnormal; Notable for the following components:      Result Value   Hemoglobin 11.8 (*)    RDW 15.7 (*)    All other components within normal limits  BASIC METABOLIC PANEL - Abnormal; Notable for the following components:   Creatinine, Ser 1.08 (*)    GFR calc non Af Amer 49 (*)    GFR calc Af Amer 57 (*)    All other components within normal limits  URINALYSIS, ROUTINE W REFLEX MICROSCOPIC    EKG None  Radiology DG Pelvis 1-2 Views  Result Date: 12/17/2019 CLINICAL DATA:  Fall, pain EXAM: PELVIS - 1-2 VIEW COMPARISON:  December 04, 2018 FINDINGS: There is no evidence of pelvic fracture or diastasis. Bilateral hip osteoarthritis is seen with superior joint space loss and marginal osteophyte formation. There is diffuse osteopenia. Surgical fixation hardware in the lower lumbar spine. IMPRESSION: No acute osseous abnormality. Electronically Signed   By: Prudencio Pair M.D.   On: 12/17/2019 16:13   DG Shoulder Right  Result Date: 12/17/2019 CLINICAL DATA:  Mechanical fall EXAM: RIGHT SHOULDER - 2+ VIEW COMPARISON:  None. FINDINGS: No fracture or dislocation. Rounded loose bodies are seen at the posterior corner of the humeral head. Mild AC  joint arthrosis is noted. No focal overlying soft tissue swelling. IMPRESSION: No definite acute osseous abnormality. Loose bodies over the posterior humeral head which may be due to calcific tendinosis. Electronically Signed   By: Prudencio Pair M.D.   On: 12/17/2019 16:10   DG Forearm Right  Result Date: 12/17/2019 CLINICAL DATA:  [place] this afternoon EXAM: RIGHT FOREARM - 2 VIEW COMPARISON:  None. FINDINGS: Frontal and lateral views of the right forearm demonstrate no acute displaced fractures. Alignment is anatomic. Soft tissues are normal. IMPRESSION: 1. No acute displaced fracture. Electronically Signed   By: Randa Ngo M.D.   On: 12/17/2019 16:11   CT Head Wo Contrast  Result Date: 12/17/2019 CLINICAL DATA:  Head trauma, headache. Neck trauma. Additional history provided: Fall EXAM: CT HEAD WITHOUT CONTRAST CT CERVICAL SPINE WITHOUT CONTRAST TECHNIQUE: Multidetector CT imaging of the head and cervical spine was performed following the standard protocol without intravenous contrast. Multiplanar CT image reconstructions of the cervical spine were also generated. COMPARISON:  Head CT 11/09/2019, MRI/MRA head 11/28/2013, CT cervical spine 11/09/2019 FINDINGS: CT HEAD FINDINGS Brain: Stable mild generalized parenchymal atrophy and chronic small vessel ischemic disease. Redemonstrated prominent perivascular space within the inferior left basal ganglia. There is no acute intracranial hemorrhage. No demarcated cortical infarct. No extra-axial fluid collection. No evidence of intracranial mass. No midline shift. Vascular: No hyperdense vessel.  Atherosclerotic calcifications Skull: Normal. Negative for fracture or focal lesion. Sinuses/Orbits: Visualized orbits show no acute finding. No significant paranasal sinus disease or mastoid effusion at the imaged levels. CT CERVICAL SPINE FINDINGS Alignment: Straightening of the expected cervical lordosis. C4-C5 grade 1 anterolisthesis, unchanged Skull base and  vertebrae: The basion-dental and atlanto-dental intervals are maintained.No evidence of acute fracture to the cervical spine. Soft tissues and spinal canal: No prevertebral fluid or swelling. No visible canal hematoma. Disc levels:  Cervical spondylosis with multilevel disc space narrowing, posterior disc osteophytes, uncovertebral and facet hypertrophy. As before, there are also bulky multilevel ventrolateral osteophytes. Upper chest: No consolidation within the imaged lung apices. No visible pneumothorax. Other: Calcified atherosclerotic plaque within the visualized aortic arch and carotid arteries. Redemonstrated thyroid nodules, the largest within the right lobe measuring up to 12 mm and not meeting consensus criteria for ultrasound follow-up IMPRESSION: CT head: 1. No evidence of acute intracranial abnormality. 2. Stable mild generalized parenchymal atrophy and chronic small vessel ischemic disease. CT cervical spine: 1. No evidence of acute fracture to the cervical spine. 2. Unchanged C4-C5 grade 1 anterolisthesis. 3. Cervical spondylosis as described. 4. Calcified atherosclerotic plaque within the visualized aortic arch and carotid arteries. Electronically Signed   By: Kellie Simmering DO   On: 12/17/2019 16:38   CT Cervical Spine Wo Contrast  Result Date: 12/17/2019 CLINICAL DATA:  Head trauma, headache. Neck trauma. Additional history provided: Fall EXAM: CT HEAD WITHOUT CONTRAST CT CERVICAL SPINE WITHOUT CONTRAST TECHNIQUE: Multidetector CT imaging of the head and cervical spine was performed following the standard protocol without intravenous contrast. Multiplanar CT image reconstructions of the cervical spine were also generated. COMPARISON:  Head CT 11/09/2019, MRI/MRA head 11/28/2013, CT cervical spine 11/09/2019 FINDINGS: CT HEAD FINDINGS Brain: Stable mild generalized parenchymal atrophy and chronic small vessel ischemic disease. Redemonstrated prominent perivascular space within the inferior left  basal ganglia. There is no acute intracranial hemorrhage. No demarcated cortical infarct. No extra-axial fluid collection. No evidence of intracranial mass. No midline shift. Vascular: No hyperdense vessel.  Atherosclerotic calcifications Skull: Normal. Negative for fracture or focal lesion. Sinuses/Orbits: Visualized orbits show no acute finding. No significant paranasal sinus disease or mastoid effusion at the imaged levels. CT CERVICAL SPINE FINDINGS Alignment: Straightening of the expected cervical lordosis. C4-C5 grade 1 anterolisthesis, unchanged Skull base and vertebrae: The basion-dental and atlanto-dental intervals are maintained.No evidence of acute fracture to the cervical spine. Soft tissues and spinal canal: No prevertebral fluid or swelling. No visible canal hematoma. Disc levels: Cervical spondylosis with multilevel disc space narrowing, posterior disc osteophytes, uncovertebral and facet hypertrophy. As before, there are also bulky multilevel ventrolateral osteophytes. Upper chest: No consolidation within the imaged lung apices. No visible pneumothorax. Other: Calcified atherosclerotic plaque within the visualized aortic arch and carotid arteries. Redemonstrated thyroid nodules, the largest within the right lobe measuring up to 12 mm and not meeting consensus criteria for ultrasound follow-up IMPRESSION: CT head: 1. No evidence of acute intracranial abnormality. 2. Stable mild generalized parenchymal atrophy and chronic small vessel ischemic disease. CT cervical spine: 1. No evidence of acute fracture to the cervical spine. 2. Unchanged C4-C5 grade 1 anterolisthesis. 3. Cervical spondylosis as described. 4. Calcified atherosclerotic plaque within the visualized aortic arch and carotid arteries. Electronically Signed   By: Kellie Simmering DO   On: 12/17/2019 16:38    Procedures Procedures (including critical care time)  Medications Ordered in ED Medications  acetaminophen (TYLENOL) tablet 650 mg  (650 mg Oral Given 12/17/19 1505)    ED Course  I have reviewed the triage vital signs and the nursing notes.  Pertinent labs & imaging results that were available during my care of the patient were reviewed by me and considered in my medical decision making (see chart for details).    MDM Rules/Calculators/A&P                         79 year old female presents to the  ED after mechanical fall causing her to hit the posterior aspect of her head.  She is currently on Xarelto.  Denies loss of consciousness, nausea, vomiting, visual changes.  Upon arrival, vitals all within normal limits.  Patient in no acute distress and non-ill-appearing.  Physical exam significant for cervical midline tenderness.  C-collar placed.  Normal neurological exam.  No signs of basilar skull fracture on exam.  Tenderness on anterior aspect of right shoulder.  Right upper extremity neurovascularly intact.  Will obtain CT head and C-spine without acute abnormalities.  X-rays of right shoulder and pelvis ordered.  Tylenol given for symptomatic relief.  Discussed case with Dr. Roderic Palau who evaluated patient at bedside and agrees with assessment and plan.  CT head/cervical spine personally reviewed which demonstrates: IMPRESSION:  CT head:    1. No evidence of acute intracranial abnormality.  2. Stable mild generalized parenchymal atrophy and chronic small  vessel ischemic disease.    CT cervical spine:    1. No evidence of acute fracture to the cervical spine.  2. Unchanged C4-C5 grade 1 anterolisthesis.  3. Cervical spondylosis as described.  4. Calcified atherosclerotic plaque within the visualized aortic  arch and carotid arteries.   Pelvis, forearm, and shoulder x-rays personally reviewed which are negative for acute abnormalities and bony fractures.  4:38 PM informed by RN that patient's daughter would like urine drawn to rule out UTI. Patient admits to malodorous urine over the past few days. Denies fever  and chills. Denies back pain.   UA unremarkable with no hematuria or signs of infection.  BMP significant for mild elevation creatinine at 1.08, but otherwise reassuring.  No electrolyte derangements.  CBC reassuring with no leukocytosis.  Mild anemia with hemoglobin at 11.8 which appears to be better than patient's baseline.  Patient able to ambulate here in the ED with a walker at baseline without any difficulties.  I had a long conversation with patient about frequent falls and being on Xarelto because she notes she has been falling a lot recently. Advised patient to speak with her cardiologist to inform him about all these falls given her increased likelihood of brain hemorrhage. Instructed patient to continue to take her medication as prescribed until further evaluation.  Patient discharged with Tylenol.  Advised patient to follow-up with PCP within the next week for further evaluation. Strict ED precautions discussed with patient. Patient states understanding and agrees to plan. Patient discharged home in no acute distress and stable vitals  Final Clinical Impression(s) / ED Diagnoses Final diagnoses:  Fall, initial encounter  Injury of head, initial encounter    Rx / DC Orders ED Discharge Orders         Ordered    acetaminophen (TYLENOL 8 HOUR) 650 MG CR tablet  Every 8 hours PRN     Discontinue     12/17/19 1957           Karie Kirks 12/17/19 Marykay Lex, MD 12/18/19 0745

## 2019-12-18 DIAGNOSIS — R350 Frequency of micturition: Secondary | ICD-10-CM | POA: Diagnosis not present

## 2019-12-19 ENCOUNTER — Telehealth: Payer: Self-pay | Admitting: Pharmacist

## 2019-12-19 NOTE — Telephone Encounter (Signed)
Patient called.  Currently on Xarelto 20 mg daily for A Fib.   Was discharged from ER on 7/12 for fall and hit her head.  This was the second recent occurrence.  Also fell and hit her head on 6/4.  At discharge, patient reports physician instructed her to discuss with cardiologist about discontinuing Xarelto due to risk of brain hemorrhage. ChadsVasc score of 4 (Age x2, Sex, CHF, HTN)   Consider switching to Eliquis 5 mg BID due to less bleed risk versus discontinuing anticoagulation altogether?

## 2019-12-19 NOTE — Telephone Encounter (Signed)
Sorry, mistyped.  ChadsVasc of 5.

## 2019-12-19 NOTE — Telephone Encounter (Signed)
Stop xarelto due to multiple falls , parkison's, orthostasis and fact she is currently in NSR risks outweigh benefit

## 2019-12-19 NOTE — Telephone Encounter (Signed)
Called patient with Dr. Kyla Balzarine recommendations. Patient will stop xarelto. Will update medications list.

## 2019-12-24 DIAGNOSIS — R410 Disorientation, unspecified: Secondary | ICD-10-CM | POA: Diagnosis not present

## 2019-12-24 DIAGNOSIS — W19XXXA Unspecified fall, initial encounter: Secondary | ICD-10-CM | POA: Diagnosis not present

## 2019-12-24 DIAGNOSIS — S99912A Unspecified injury of left ankle, initial encounter: Secondary | ICD-10-CM | POA: Diagnosis not present

## 2019-12-24 DIAGNOSIS — M79672 Pain in left foot: Secondary | ICD-10-CM | POA: Diagnosis not present

## 2019-12-25 ENCOUNTER — Emergency Department (HOSPITAL_COMMUNITY): Payer: Medicare Other

## 2019-12-25 ENCOUNTER — Other Ambulatory Visit: Payer: Self-pay

## 2019-12-25 ENCOUNTER — Encounter (HOSPITAL_COMMUNITY): Payer: Self-pay | Admitting: Radiology

## 2019-12-25 ENCOUNTER — Emergency Department (HOSPITAL_COMMUNITY)
Admission: EM | Admit: 2019-12-25 | Discharge: 2019-12-26 | Disposition: A | Discharge status: Home / Self Care | Payer: Medicare Other | Attending: Emergency Medicine | Admitting: Emergency Medicine

## 2019-12-25 DIAGNOSIS — R309 Painful micturition, unspecified: Secondary | ICD-10-CM | POA: Diagnosis not present

## 2019-12-25 DIAGNOSIS — I130 Hypertensive heart and chronic kidney disease with heart failure and stage 1 through stage 4 chronic kidney disease, or unspecified chronic kidney disease: Secondary | ICD-10-CM | POA: Diagnosis not present

## 2019-12-25 DIAGNOSIS — R300 Dysuria: Secondary | ICD-10-CM | POA: Diagnosis not present

## 2019-12-25 DIAGNOSIS — Z20822 Contact with and (suspected) exposure to covid-19: Secondary | ICD-10-CM | POA: Diagnosis not present

## 2019-12-25 DIAGNOSIS — R4182 Altered mental status, unspecified: Secondary | ICD-10-CM

## 2019-12-25 DIAGNOSIS — Z79899 Other long term (current) drug therapy: Secondary | ICD-10-CM | POA: Insufficient documentation

## 2019-12-25 DIAGNOSIS — R0902 Hypoxemia: Secondary | ICD-10-CM | POA: Diagnosis not present

## 2019-12-25 DIAGNOSIS — N183 Chronic kidney disease, stage 3 unspecified: Secondary | ICD-10-CM | POA: Diagnosis not present

## 2019-12-25 DIAGNOSIS — I509 Heart failure, unspecified: Secondary | ICD-10-CM | POA: Insufficient documentation

## 2019-12-25 DIAGNOSIS — J80 Acute respiratory distress syndrome: Secondary | ICD-10-CM | POA: Diagnosis not present

## 2019-12-25 DIAGNOSIS — R410 Disorientation, unspecified: Secondary | ICD-10-CM | POA: Diagnosis not present

## 2019-12-25 DIAGNOSIS — G20 Parkinson's disease: Secondary | ICD-10-CM | POA: Insufficient documentation

## 2019-12-25 DIAGNOSIS — I10 Essential (primary) hypertension: Secondary | ICD-10-CM | POA: Diagnosis not present

## 2019-12-25 DIAGNOSIS — R442 Other hallucinations: Secondary | ICD-10-CM | POA: Diagnosis not present

## 2019-12-25 LAB — CBC
HCT: 35.4 % — ABNORMAL LOW (ref 36.0–46.0)
Hemoglobin: 11.2 g/dL — ABNORMAL LOW (ref 12.0–15.0)
MCH: 28.4 pg (ref 26.0–34.0)
MCHC: 31.6 g/dL (ref 30.0–36.0)
MCV: 89.8 fL (ref 80.0–100.0)
Platelets: 170 10*3/uL (ref 150–400)
RBC: 3.94 MIL/uL (ref 3.87–5.11)
RDW: 15.6 % — ABNORMAL HIGH (ref 11.5–15.5)
WBC: 6.2 10*3/uL (ref 4.0–10.5)
nRBC: 0 % (ref 0.0–0.2)

## 2019-12-25 LAB — URINALYSIS, ROUTINE W REFLEX MICROSCOPIC
Bilirubin Urine: NEGATIVE
Glucose, UA: NEGATIVE mg/dL
Hgb urine dipstick: NEGATIVE
Ketones, ur: 5 mg/dL — AB
Leukocytes,Ua: NEGATIVE
Nitrite: NEGATIVE
Protein, ur: NEGATIVE mg/dL
Specific Gravity, Urine: 1.017 (ref 1.005–1.030)
pH: 5 (ref 5.0–8.0)

## 2019-12-25 LAB — COMPREHENSIVE METABOLIC PANEL
ALT: 5 U/L (ref 0–44)
AST: 25 U/L (ref 15–41)
Albumin: 4 g/dL (ref 3.5–5.0)
Alkaline Phosphatase: 104 U/L (ref 38–126)
Anion gap: 11 (ref 5–15)
BUN: 22 mg/dL (ref 8–23)
CO2: 26 mmol/L (ref 22–32)
Calcium: 9.2 mg/dL (ref 8.9–10.3)
Chloride: 102 mmol/L (ref 98–111)
Creatinine, Ser: 0.94 mg/dL (ref 0.44–1.00)
GFR calc Af Amer: >60 mL/min (ref >60)
GFR calc non Af Amer: 59 mL/min — ABNORMAL LOW (ref >60)
Glucose, Bld: 111 mg/dL — ABNORMAL HIGH (ref 70–99)
Potassium: 4.8 mmol/L (ref 3.5–5.1)
Sodium: 139 mmol/L (ref 135–145)
Total Bilirubin: 1.4 mg/dL — ABNORMAL HIGH (ref 0.3–1.2)
Total Protein: 7.3 g/dL (ref 6.5–8.1)

## 2019-12-25 LAB — SARS CORONAVIRUS 2 BY RT PCR (HOSPITAL ORDER, PERFORMED IN ~~LOC~~ HOSPITAL LAB): SARS Coronavirus 2: NEGATIVE

## 2019-12-25 LAB — BRAIN NATRIURETIC PEPTIDE: B Natriuretic Peptide: 43.7 pg/mL (ref 0.0–100.0)

## 2019-12-25 LAB — TSH: TSH: 1.298 u[IU]/mL (ref 0.350–4.500)

## 2019-12-25 NOTE — ED Triage Notes (Signed)
PER EMS: Patient is coming from home with c/o increased confusion and hallucinations for the past week. [facility]. Pt had a fall last Monday and all scans came back clear and was taken off blood thinners. Recent bruise to left leg that is warm to touch, unknown etiology. Pt has parkinson and has not walked since her fall.   EMS VITALS: BP 130/75 HR 94 RR 22 SPO2 94% RA CBG 132  20G RAC

## 2019-12-25 NOTE — ED Provider Notes (Signed)
[facility] DEPT Provider Note   CSN: [id number] Arrival date & time: 12/25/19  2105     History No chief complaint on file.   [name] is a 79 y.o. female.   Altered Mental Status Presenting symptoms: behavior changes, confusion and disorientation   Severity:  Moderate Episode history:  Continuous Timing:  Intermittent Progression:  Waxing and waning Chronicity:  Recurrent Context comment:  Usually a sign of UTI Associated symptoms: no fever, no headaches, no light-headedness, no nausea, no palpitations, no rash and no vomiting        Past Medical History:  Diagnosis Date  . Allergic rhinitis   . Arthritis    "all over"  . CHF (congestive heart failure) ([place])   . Chronic back pain   . DJD (degenerative joint disease)   . 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 GERD (gastroesophageal reflux disease)   . Hyperlipidemia   . Hypertension   . Lumbar stenosis   . OSA (obstructive sleep apnea)    "couldn't handle CPAP; use mouth guard some; not all the time" (01/06/2014)  . PAF (paroxysmal atrial fibrillation) ([place])   . Scoliosis    with radiculopathy L2-S1 with prior surgery  . Small bowel obstruction (HCC)    versus ileus after last bck surgery  . Spondylosis     Patient Active Problem List   Diagnosis Date Noted  . PSP (progressive supranuclear palsy) ([place]) 08/10/2019  . UTI (urinary tract infection) 12/06/2018  . Vertebral osteomyelitis ([place]) 12/05/2018  . Spinal stenosis 12/05/2018  . AKI (acute kidney injury) ([place]) 12/05/2018  . Acute metabolic encephalopathy 85/88/5027  . Chronic diastolic (congestive) heart failure ([place]) 10/07/2018  . GERD (gastroesophageal reflux disease) 10/07/2018  . Abnormal glucose tolerance test 07/01/2018  . Colon, diverticulosis 07/01/2018  . 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 Glaucoma 07/01/2018  . Chronic kidney disease (CKD), stage III (moderate) 07/01/2018  . Neuropathy 07/01/2018  . Pure  hypercholesterolemia 07/01/2018  . Restless leg 07/01/2018  . Discitis of thoracolumbar region 07/01/2018  . Acute bronchitis due to infection 05/04/2017  . Cough 04/21/2016  . Preoperative cardiovascular examination 04/18/2015  . Parkinsonism ([place]) 05/15/2014  . Chronic anticoagulation -warfarin therapy 02/03/2014  . Atrial fibrillation with RVR ([place]) 01/31/2014  . Gait instability 11/16/2013  . Edema 03/03/2010  . ATRIAL FIBRILLATION 04/23/2008  . Hyperlipidemia 01/31/2008  . Obstructive sleep apnea 01/31/2008  . GLAUCOMA 01/30/2008  . Seasonal and perennial allergic rhinitis 01/30/2008  . Osteoarthritis 01/30/2008    Past Surgical History:  Procedure Laterality Date  . BACK SURGERY    . CATARACT EXTRACTION W/ INTRAOCULAR LENS  IMPLANT, BILATERAL  2011  . CHOLECYSTECTOMY N/A 07/07/2018   Procedure: LAPAROSCOPIC CHOLECYSTECTOMY;  Surgeon: Erroll Luna, MD;  Location: [facility];  Service: General;  Laterality: N/A;  . COLONOSCOPY  12/09/2004  . LIPOMA EXCISION  1980's   "fatty tumors"  . [place] SURGERY  02/2009   "ruptured disc"  . NASAL SEPTUM SURGERY  80's  . POSTERIOR LUMBAR FUSION  06/2010; 10/2011   "placed screws, rods, spacers both times"  . POSTERIOR LUMBAR FUSION 4 LEVEL N/A 12/13/2018   Procedure: THORACIC NINE -LUMBAR THREE POSTERIOR INSTRUMENTATION FUSION;  Surgeon: Judith Part, MD;  Location: [facility];  Service: Neurosurgery;  Laterality: N/A;  . REPAIR DURAL / CSF LEAK  02/2009  . THORACIC LAMINECTOMY FOR EPIDURAL ABSCESS N/A 09/28/2018   Procedure: THORACIC NINE-THORACIC TEN, THORACIC TEN-THORACIC ELEVEN, THORACIC ELEVEN-THORACIC TWELVE LAMINECTOMIES FOR EPIDURAL ABSCESS;  Surgeon: Emelda Brothers  A, MD;  Location: [facility];  Service: Neurosurgery;  Laterality: N/A;  . TOTAL ABDOMINAL HYSTERECTOMY  02/1993     OB History   No obstetric history on file.     Family History  Problem Relation Age of Onset  . Arthritis Mother   . Heart attack Father   .  Hypertension Sister   . Lung cancer Sister   . Asthma Sister   . Allergies Sister     Social History   Tobacco Use  . Smoking status: Never Smoker  . Smokeless tobacco: Never Used  Vaping Use  . Vaping Use: Never used  Substance Use Topics  . Alcohol use: No    Alcohol/week: 0.0 standard drinks  . Drug use: No    Home Medications Prior to Admission medications   Medication Sig Start Date End Date Taking? Authorizing Provider  acetaminophen (TYLENOL 8 HOUR) 650 MG CR tablet Take 1 tablet (650 mg total) by mouth every 8 (eight) hours as needed for pain. 12/17/19   Suzy Bouchard, PA-C  acetaminophen (TYLENOL) 325 MG tablet Take 325 mg by mouth every 6 (six) hours as needed for headache (pain).     [provider]  carbidopa-levodopa (SINEMET IR) 25-100 MG tablet Take 2 tablets by mouth 3 (three) times daily. Patient taking differently: Take 2 tablets by mouth 3 (three) times daily. 8am, noon and 4pm 10/09/19   Tat, Eustace Quail, DO  Cholecalciferol (HM VITAMIN D3) 50 MCG (2000 UT) CAPS Take 8,000 Units by mouth at bedtime.     [provider]  clonazePAM (KLONOPIN) 0.5 MG tablet Take 0.5 tablets (0.25 mg total) by mouth at bedtime. 08/10/19   Tat, Eustace Quail, DO  DULoxetine (CYMBALTA) 60 MG capsule Take 60 mg by mouth 2 (two) times daily with a meal. 10/25/19   [provider]  flecainide (TAMBOCOR) 50 MG tablet Take 1 tablet (50 mg total) by mouth 2 (two) times daily. Patient taking differently: Take 50 mg by mouth 2 (two) times daily with a meal.  09/11/19   Josue Hector, MD  furosemide (LASIX) 20 MG tablet Take 1 tablet (20 mg total) by mouth daily. 01/01/19 01/29/20  Leanor Kail, PA  gabapentin (NEURONTIN) 300 MG capsule Take 300-600 mg by mouth See admin instructions. Take one capsule (300 mg) by mouth daily with lunch and supper, take two capsules (600 mg) at bedtime    [provider]  Menthol, Topical Analgesic, (BIOFREEZE EX) Apply 1  application topically 2 (two) times daily as needed (pain).    [provider]  metoprolol succinate (TOPROL-XL) 50 MG 24 hr tablet Take 1/2 tablet by mouth daily.Take with or immediately following a meal. 03/20/19   Josue Hector, MD  mirabegron ER (MYRBETRIQ) 50 MG TB24 tablet Take 50 mg by mouth daily.    [provider]  pantoprazole (PROTONIX) 40 MG tablet Take 40 mg by mouth daily.     [provider]  polyethylene glycol (MIRALAX / GLYCOLAX) packet Take 17 g by mouth daily. Patient taking differently: Take 17 g by mouth daily as needed (constipation).  07/10/18   Raiford Noble Latif, DO  potassium chloride SA (K-DUR) 20 MEQ tablet Take 1 tablet (20 mEq total) by mouth daily. 01/01/19   Bhagat, Crista Luria, PA  pravastatin (PRAVACHOL) 40 MG tablet Take 40 mg by mouth daily with lunch.     [provider]  TRAVATAN Z 0.004 % SOLN ophthalmic solution Place 1 drop into both eyes  at bedtime.  01/21/15   [provider]    Allergies    Lyrica [pregabalin], Zocor [simvastatin], and Relafen [nabumetone]  Review of Systems   Review of Systems  Constitutional: Negative for chills and fever.  HENT: Negative for congestion and rhinorrhea.   Respiratory: Negative for cough and shortness of breath.   Cardiovascular: Negative for chest pain and palpitations.  Gastrointestinal: Negative for diarrhea, nausea and vomiting.  Genitourinary: Positive for difficulty urinating and dysuria.  Musculoskeletal: Negative for arthralgias and back pain.  Skin: Negative for rash and wound.  Neurological: Negative for light-headedness and headaches.  Psychiatric/Behavioral: Positive for confusion.    Physical Exam Updated Vital Signs BP (!) 154/75 (BP Location: Right Arm)   Pulse 86   Temp 98.2 F (36.8 C) (Oral)   Resp 20   Ht 5\' 5"  (1.651 m)   Wt 83.9 kg   SpO2 98%   BMI 30.79 kg/m   Physical Exam Vitals and nursing note reviewed. Exam conducted with a  chaperone present.  Constitutional:      General: She is not in acute distress.    Appearance: Normal appearance.  HENT:     Head: Normocephalic and atraumatic.     Nose: No rhinorrhea.  Eyes:     General:        Right eye: No discharge.        Left eye: No discharge.     Conjunctiva/sclera: Conjunctivae normal.  Cardiovascular:     Rate and Rhythm: Normal rate and regular rhythm.  Pulmonary:     Effort: Pulmonary effort is normal. No respiratory distress.     Breath sounds: No stridor.  Abdominal:     General: Abdomen is flat. There is no distension.     Palpations: Abdomen is soft.  Musculoskeletal:        General: No tenderness or signs of injury.  Skin:    General: Skin is warm and dry.  Neurological:     Mental Status: She is alert and oriented to person, place, and time. Mental status is at baseline.     Motor: No weakness.     Comments: No facial asymmetry, equal strength in all extremities, sensation intact throughout, did not ambulate patient currently.  Psychiatric:        Mood and Affect: Mood normal.        Behavior: Behavior normal.     ED Results / Procedures / Treatments   Labs (all labs ordered are listed, but only abnormal results are displayed) Labs Reviewed  SARS CORONAVIRUS 2 BY RT PCR (HOSPITAL ORDER, [facility])  CBC  COMPREHENSIVE METABOLIC PANEL  URINALYSIS, ROUTINE W REFLEX MICROSCOPIC  TSH  BRAIN NATRIURETIC PEPTIDE    EKG EKG Interpretation  Date/Time:  Tuesday December 25 2019 22:12:01 EDT Ventricular Rate:  87 PR Interval:    QRS Duration: 92 QT Interval:  337 QTC Calculation: 406 R Axis:   14 Text Interpretation: Sinus rhythm Confirmed by Dewaine Conger [phone]) on 12/25/2019 10:14:51 PM   Radiology CT Head Wo Contrast  Result Date: 12/25/2019 CLINICAL DATA:  79 year old female with altered mental status. EXAM: CT HEAD WITHOUT CONTRAST TECHNIQUE: Contiguous axial images were obtained from the base of the  skull through the vertex without intravenous contrast. COMPARISON:  Head CT dated 12/17/2019. FINDINGS: Brain: Mild age-related atrophy and chronic microvascular ischemic changes. There is no acute intracranial hemorrhage. No mass effect or midline shift. No extra-axial fluid collection. Vascular: No hyperdense vessel  or unexpected calcification. Skull: Normal. Negative for fracture or focal lesion. Sinuses/Orbits: No acute finding. Other: None IMPRESSION: 1. No acute intracranial pathology. 2. Mild age-related atrophy and chronic microvascular ischemic changes. Electronically Signed   By: Anner Crete M.D.   On: 12/25/2019 22:08    Procedures Procedures (including critical care time)  Medications Ordered in ED Medications - No data to display  ED Course  I have reviewed the triage vital signs and the nursing notes.  Pertinent labs & imaging results that were available during my care of the patient were reviewed by me and considered in my medical decision making (see chart for details).    MDM Rules/Calculators/A&P                          Patient arrives with family with altered mental status, they said this usually happens with UTI.  She has been without fevers but has felt subjective fevers and chills.  She had some urinary difficulty, no significant abdominal pain, normal bowel movements for her.  A skin change on the left lower extremity the family has been watching, does not appear swollen, and appears to be a bruise, she has restless leg syndrome is focal, no significant tenderness.  Intact pulses in the extremities.  She was formally on blood thinners but no longer.  She will get a CT scan urinalysis CBC chemistry  CT scan was read by radiology myself there is no acute cranial abnormalities.  EKG shows sinus rhythm without acute ischemic change interval abnormality or arrhythmia after my review.  Laboratory studies show no leukocytosis no significant signs of new anemia, normal  kidney function for her, TSH within normal limits BMP within normal limits.  Still waiting for urinalysis.  The patient is well-appearing here, I spoke with the family about potentially disorientation being related to medication side effect that she is on many medications that can affect her mental status to include benzodiazepines and gabapentin.  If urinalysis is negative we may follow-up with her doctor, for the plan, if it is positive we will treat for UTI.  Her mental status has been waxing and waning but is normal here today.  She is alert and oriented x3 and can answer all questions, and her family says she is at baseline.  Pt care was handed off to on coming provider at 2330.  Complete history and physical and current plan have been communicated.  Please refer to their note for the remainder of ED care and ultimate disposition.    Final Clinical Impression(s) / ED Diagnoses Final diagnoses:  None    Rx / DC Orders ED Discharge Orders    None       Breck Coons, MD 12/25/19 2327

## 2019-12-26 MED ORDER — ACETAMINOPHEN 500 MG PO TABS
1000.0000 mg | ORAL_TABLET | Freq: Once | ORAL | Status: AC
  Administered 2019-12-26: 1000 mg via ORAL
  Filled 2019-12-26: qty 2

## 2019-12-27 DIAGNOSIS — R3911 Hesitancy of micturition: Secondary | ICD-10-CM | POA: Diagnosis not present

## 2019-12-27 DIAGNOSIS — R54 Age-related physical debility: Secondary | ICD-10-CM | POA: Diagnosis not present

## 2019-12-27 DIAGNOSIS — R410 Disorientation, unspecified: Secondary | ICD-10-CM | POA: Diagnosis not present

## 2019-12-27 DIAGNOSIS — M79605 Pain in left leg: Secondary | ICD-10-CM | POA: Diagnosis not present

## 2019-12-28 DIAGNOSIS — G20 Parkinson's disease: Secondary | ICD-10-CM | POA: Diagnosis not present

## 2019-12-28 DIAGNOSIS — I509 Heart failure, unspecified: Secondary | ICD-10-CM | POA: Diagnosis not present

## 2019-12-28 DIAGNOSIS — K219 Gastro-esophageal reflux disease without esophagitis: Secondary | ICD-10-CM | POA: Diagnosis not present

## 2019-12-28 DIAGNOSIS — G629 Polyneuropathy, unspecified: Secondary | ICD-10-CM | POA: Diagnosis not present

## 2019-12-28 DIAGNOSIS — Z7901 Long term (current) use of anticoagulants: Secondary | ICD-10-CM | POA: Diagnosis not present

## 2019-12-28 DIAGNOSIS — I130 Hypertensive heart and chronic kidney disease with heart failure and stage 1 through stage 4 chronic kidney disease, or unspecified chronic kidney disease: Secondary | ICD-10-CM | POA: Diagnosis not present

## 2019-12-28 DIAGNOSIS — G4733 Obstructive sleep apnea (adult) (pediatric): Secondary | ICD-10-CM | POA: Diagnosis not present

## 2019-12-28 DIAGNOSIS — D631 Anemia in chronic kidney disease: Secondary | ICD-10-CM | POA: Diagnosis not present

## 2019-12-28 DIAGNOSIS — I4891 Unspecified atrial fibrillation: Secondary | ICD-10-CM | POA: Diagnosis not present

## 2019-12-28 DIAGNOSIS — M4714 Other spondylosis with myelopathy, thoracic region: Secondary | ICD-10-CM | POA: Diagnosis not present

## 2019-12-28 DIAGNOSIS — G4700 Insomnia, unspecified: Secondary | ICD-10-CM | POA: Diagnosis not present

## 2019-12-28 DIAGNOSIS — F419 Anxiety disorder, unspecified: Secondary | ICD-10-CM | POA: Diagnosis not present

## 2019-12-28 DIAGNOSIS — N189 Chronic kidney disease, unspecified: Secondary | ICD-10-CM | POA: Diagnosis not present

## 2019-12-28 DIAGNOSIS — F0280 Dementia in other diseases classified elsewhere without behavioral disturbance: Secondary | ICD-10-CM | POA: Diagnosis not present

## 2020-01-01 DIAGNOSIS — R351 Nocturia: Secondary | ICD-10-CM | POA: Diagnosis not present

## 2020-01-01 DIAGNOSIS — N3946 Mixed incontinence: Secondary | ICD-10-CM | POA: Diagnosis not present

## 2020-01-01 DIAGNOSIS — R350 Frequency of micturition: Secondary | ICD-10-CM | POA: Diagnosis not present

## 2020-01-08 DIAGNOSIS — R350 Frequency of micturition: Secondary | ICD-10-CM | POA: Diagnosis not present

## 2020-01-14 ENCOUNTER — Other Ambulatory Visit: Payer: Self-pay | Admitting: Neurology

## 2020-01-14 NOTE — Telephone Encounter (Signed)
Patient's daughter called in and needs a refill of her Klonopin sent to [facility] on [address] in [place]

## 2020-01-15 DIAGNOSIS — R350 Frequency of micturition: Secondary | ICD-10-CM | POA: Diagnosis not present

## 2020-01-15 NOTE — Telephone Encounter (Signed)
Spoke with daughter re klonopin. She verbalized, after calling pharmacy again then calling me back, that she realizes now where the discrepancy happened...she has been giving her mother klonopin 0.5 mg at HS instead of 0.25 mg at HS. Told her that I would let Dr Tat know and update her.  Daughter states that her mother is NOT taking tramadol.

## 2020-01-15 NOTE — Telephone Encounter (Signed)
This may be the very reason she had MS change as well.  Given its a controlled substance with correct instructions on the bottle, we will not refill early

## 2020-01-15 NOTE — Telephone Encounter (Signed)
Spoke with pts daughter and told her that Dr Tat will not refill klonopin since it is a controlled med and has clear inst on label, she verbalized understanding.

## 2020-01-15 NOTE — Telephone Encounter (Signed)
Per PDMP, it was last filled on 6/3 for 90 days.  Should not need filled right now.  Also concerned b/c she had an episode of MS change and was in the ER since last visit (just recently) and would like to review meds.  I thought she was off ultram but appears per database that she is on both so would like to address at [facility] visit.

## 2020-01-22 DIAGNOSIS — R350 Frequency of micturition: Secondary | ICD-10-CM | POA: Diagnosis not present

## 2020-01-27 DIAGNOSIS — Z7901 Long term (current) use of anticoagulants: Secondary | ICD-10-CM | POA: Diagnosis not present

## 2020-01-27 DIAGNOSIS — F0280 Dementia in other diseases classified elsewhere without behavioral disturbance: Secondary | ICD-10-CM | POA: Diagnosis not present

## 2020-01-27 DIAGNOSIS — F419 Anxiety disorder, unspecified: Secondary | ICD-10-CM | POA: Diagnosis not present

## 2020-01-27 DIAGNOSIS — G20 Parkinson's disease: Secondary | ICD-10-CM | POA: Diagnosis not present

## 2020-01-27 DIAGNOSIS — I509 Heart failure, unspecified: Secondary | ICD-10-CM | POA: Diagnosis not present

## 2020-01-27 DIAGNOSIS — I4891 Unspecified atrial fibrillation: Secondary | ICD-10-CM | POA: Diagnosis not present

## 2020-01-27 DIAGNOSIS — N189 Chronic kidney disease, unspecified: Secondary | ICD-10-CM | POA: Diagnosis not present

## 2020-01-27 DIAGNOSIS — M4714 Other spondylosis with myelopathy, thoracic region: Secondary | ICD-10-CM | POA: Diagnosis not present

## 2020-01-27 DIAGNOSIS — K219 Gastro-esophageal reflux disease without esophagitis: Secondary | ICD-10-CM | POA: Diagnosis not present

## 2020-01-27 DIAGNOSIS — G4700 Insomnia, unspecified: Secondary | ICD-10-CM | POA: Diagnosis not present

## 2020-01-27 DIAGNOSIS — G4733 Obstructive sleep apnea (adult) (pediatric): Secondary | ICD-10-CM | POA: Diagnosis not present

## 2020-01-27 DIAGNOSIS — G629 Polyneuropathy, unspecified: Secondary | ICD-10-CM | POA: Diagnosis not present

## 2020-01-27 DIAGNOSIS — D631 Anemia in chronic kidney disease: Secondary | ICD-10-CM | POA: Diagnosis not present

## 2020-01-27 DIAGNOSIS — I130 Hypertensive heart and chronic kidney disease with heart failure and stage 1 through stage 4 chronic kidney disease, or unspecified chronic kidney disease: Secondary | ICD-10-CM | POA: Diagnosis not present

## 2020-01-29 DIAGNOSIS — R350 Frequency of micturition: Secondary | ICD-10-CM | POA: Diagnosis not present

## 2020-01-30 DIAGNOSIS — R350 Frequency of micturition: Secondary | ICD-10-CM | POA: Diagnosis not present

## 2020-01-30 DIAGNOSIS — N3946 Mixed incontinence: Secondary | ICD-10-CM | POA: Diagnosis not present

## 2020-02-05 DIAGNOSIS — R350 Frequency of micturition: Secondary | ICD-10-CM | POA: Diagnosis not present

## 2020-02-12 DIAGNOSIS — R350 Frequency of micturition: Secondary | ICD-10-CM | POA: Diagnosis not present

## 2020-02-13 DIAGNOSIS — G5793 Unspecified mononeuropathy of bilateral lower limbs: Secondary | ICD-10-CM | POA: Diagnosis not present

## 2020-02-13 DIAGNOSIS — M19072 Primary osteoarthritis, left ankle and foot: Secondary | ICD-10-CM | POA: Diagnosis not present

## 2020-02-13 DIAGNOSIS — M19071 Primary osteoarthritis, right ankle and foot: Secondary | ICD-10-CM | POA: Diagnosis not present

## 2020-02-13 NOTE — Progress Notes (Addendum)
Assessment/Plan:   1.   PSP             -Continue carbidopa/levodopa 25/100, 2 tablets 3 times per day             -Levodopa challenge test has been done in the past and levodopa was only mildly beneficial, but the patient thinks it is helpful, so we have continued it.  -Multiple falls and would recommend discontinuing walking altogether.  Lengthy discussion with patient and family about the fact that I think she would benefit greatly from an electric wheelchair.   Pt needs power WC as she has difficulty getting to bathroom on time with walker without falling. Pt having trouble navigating getting in and out of shower and power WC would be of great benefit with this task that walker is of no benefit for.  Pt has the mental capability to use the power WC.  Pt cannot use manual WC because of endurance abilities to propel and poor coordination of UE's. Her husband is quite ill and could not help her propel a manual WC.  She could not mount a scooter/transfer onto safely, hence making it inappropriate for this patient.   Multiple falls already with traditional walker and rollator.  she currently has ability to pressure relieve but in future he may not given progressive degenerative nature of this disease.  Pt needs ability of power WC in the future to tilt and recline if requires this.  -I am referring patient for PT Mobility/Seating Evaluation for Power Wheelchair.   2.  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  She has been following with infectious disease.  I just stopped her chronic doxycycline.  3.  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 Trula Ore, pain management  4.  RLS and RBD             -d/c klonopin as not helping  -discussed hospital bed but they didn't want to do that as she wants to sleep with her husband  Subjective:   [name] was seen today in follow up for PSP.  My previous records were reviewed  prior to todays visit as well as outside records available to me.  We stopped pramipexole last visit, as I thought it could be contributing to some of her lower extremity edema.  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  She reports that the swelling is some better and so do daughters.  Multiple medical records are reviewed.  Has been in the emergency room multiple times since last visit, many of these with falls.  ER records indicate that most of these falls were in backward direction (common with PSP).  C/o neuropathic pain in the feet.  Went to [facility] and they sent her to pain management and she never got there because she fell.  She then went to Dr. Gershon Mussel who sent her back to Dr. Trula Ore.  He gives her cortisone injections injections and told her that he would like to do a procedure in which he "deadens" the nerves in the back.  They don't like to go there because of the wait in the waiting room but think that they will try a first AM appt.  Daughter states that not sure that klonopin not so helpful for RBD.  They have tried a rail and it made her feet pain worse because her legs were against the rail.  She now has a rail against the bed.  Also have not been sleeping well b/c of bladder issues and having PTNS.    Current prescribed movement disorder medications: Carbidopa/levodopa 25/100, 2 tablets 3 times per day Klonopin - 0.5 mg - 1/2 q hs (started last visit)  PREVIOUS MEDICATIONS: pramipexole  ALLERGIES:   Allergies  Allergen Reactions  . Lyrica [Pregabalin] Other (See Comments)    Felt loopy  . Zocor [Simvastatin] Other (See Comments)    Myalgias  . Relafen [Nabumetone] Rash    CURRENT MEDICATIONS:  Outpatient Encounter Medications as of 02/14/2020  Medication Sig  . acetaminophen (TYLENOL 8 HOUR) 650 MG CR tablet Take 1 tablet (650 mg total) by mouth every 8 (eight) hours as needed for pain.  . carbidopa-levodopa (SINEMET IR) 25-100 MG tablet  Take 2 tablets by mouth 3 (three) times daily. (Patient taking differently: Take 2 tablets by mouth 3 (three) times daily. 8am, noon and 4pm)  . clonazePAM (KLONOPIN) 0.5 MG tablet Take 0.5 tablets (0.25 mg total) by mouth at bedtime.  . DULoxetine (CYMBALTA) 60 MG capsule Take 60 mg by mouth 2 (two) times daily with a meal.  . flecainide (TAMBOCOR) 50 MG tablet Take 1 tablet (50 mg total) by mouth 2 (two) times daily. (Patient taking differently: Take 50 mg by mouth 2 (two) times daily with a meal. )  . furosemide (LASIX) 20 MG tablet Take 1 tablet (20 mg total) by mouth daily.  Marland Kitchen gabapentin (NEURONTIN) 300 MG capsule Take 300 mg by mouth See admin instructions. Take one capsule (300 mg) by mouth daily with lunch and supper, take two capsules (600 mg) at bedtime  . Melatonin 10 MG TABS Take 1 tablet by mouth at bedtime.  . Menthol, Topical Analgesic, (BIOFREEZE EX) Apply 1 application topically daily as needed (pain).   . metoprolol succinate (TOPROL-XL) 25 MG 24 hr tablet Take 12.5 mg by mouth daily.  . mirabegron ER (MYRBETRIQ) 50 MG TB24 tablet Take 50 mg by mouth daily.  . pantoprazole (PROTONIX) 40 MG tablet Take 40 mg by mouth daily.   . polyethylene glycol (MIRALAX / GLYCOLAX) packet Take 17 g by mouth daily. (Patient taking differently: Take 17 g by mouth daily as needed for mild constipation. )  . potassium chloride SA (K-DUR) 20 MEQ tablet Take 1 tablet (20 mEq total) by mouth daily.  . pravastatin (PRAVACHOL) 40 MG tablet Take 40 mg by mouth daily with lunch.   . TRAVATAN Z 0.004 % SOLN ophthalmic solution Place 1 drop into both eyes at bedtime.   . Cholecalciferol (HM VITAMIN D3) 50 MCG (2000 UT) CAPS Take 8,000 Units by mouth at bedtime.  (Patient not taking: Reported on 02/14/2020)  . [DISCONTINUED] metoprolol succinate (TOPROL-XL) 50 MG 24 hr tablet Take 1/2 tablet by mouth daily.Take with or immediately following a meal. (Patient not taking: Reported on 02/14/2020)  . [DISCONTINUED]  rivaroxaban (XARELTO) 20 MG TABS tablet Take 20 mg by mouth daily with supper. (Patient not taking: Reported on 02/14/2020)   No facility-administered encounter medications on file as of 02/14/2020.    Objective:   PHYSICAL EXAMINATION:    VITALS:   Vitals:   02/14/20 1431  BP: 139/70  Pulse: 94  SpO2: 97%  Weight: 199 lb (90.3 kg)  Height: 5\' 7"  (1.702 m)    GEN:  The patient appears stated age and  is in NAD. HEENT:  Normocephalic, atraumatic.  The mucous membranes are moist. The superficial temporal arteries are without ropiness or tenderness. CV:  RRR Lungs:  CTAB Neck/HEME:  There are no carotid bruits bilaterally.  Neurological examination:  Orientation: The patient is alert and oriented x3. Cranial nerves: There is good facial symmetry with facial hypomimia. The speech is fluent and clear. Soft palate rises symmetrically and there is no tongue deviation. Hearing is intact to conversational tone. Sensation: Sensation is intact to light touch throughout Motor: Strength is at least antigravity x4.  Movement examination: Tone: There is mild increased tone in the bilateral UE Abnormal movements: none Coordination:  There is mild decremation with RAM's Gait and Station:  Patient pushes off of the chair to arise.  She is slow to arise.  She has left foot drop wears a brace.  She is slow and shuffles even with the walker.  She is unsteady and the examiner walks close to her.    I have reviewed and interpreted the following labs independently    Chemistry      Component Value Date/Time   NA 139 12/25/2019 2145   K 4.8 12/25/2019 2145   CL 102 12/25/2019 2145   CO2 26 12/25/2019 2145   BUN 22 12/25/2019 2145   CREATININE 0.94 12/25/2019 2145   CREATININE 0.93 01/11/2019 1547      Component Value Date/Time   CALCIUM 9.2 12/25/2019 2145   ALKPHOS 104 12/25/2019 2145   AST 25 12/25/2019 2145   ALT 5 12/25/2019 2145   BILITOT 1.4 (H) 12/25/2019 2145       Lab Results    Component Value Date   WBC 6.2 12/25/2019   HGB 11.2 (L) 12/25/2019   HCT 35.4 (L) 12/25/2019   MCV 89.8 12/25/2019   PLT 170 12/25/2019    Lab Results  Component Value Date   TSH 1.298 12/25/2019     Total time spent on today's visit was 45 minutes, including both face-to-face time and nonface-to-face time.  Time included that spent on review of records (prior notes available to me/labs/imaging if pertinent), discussing treatment and goals, answering patient's questions and coordinating care.  Cc:  [name], MD

## 2020-02-14 ENCOUNTER — Encounter: Payer: Self-pay | Admitting: Neurology

## 2020-02-14 ENCOUNTER — Other Ambulatory Visit: Payer: Self-pay

## 2020-02-14 ENCOUNTER — Ambulatory Visit: Payer: Medicare Other | Admitting: Neurology

## 2020-02-14 VITALS — BP 139/70 | HR 94 | Ht 67.0 in | Wt 199.0 lb

## 2020-02-14 DIAGNOSIS — G231 Progressive supranuclear ophthalmoplegia [Steele-Richardson-Olszewski]: Secondary | ICD-10-CM | POA: Diagnosis not present

## 2020-02-14 DIAGNOSIS — G2581 Restless legs syndrome: Secondary | ICD-10-CM | POA: Diagnosis not present

## 2020-02-14 DIAGNOSIS — G609 Hereditary and idiopathic neuropathy, unspecified: Secondary | ICD-10-CM | POA: Diagnosis not present

## 2020-02-21 ENCOUNTER — Telehealth: Payer: Self-pay | Admitting: *Deleted

## 2020-02-21 NOTE — Telephone Encounter (Signed)
  Patient Consent for Virtual Visit         [name] has provided verbal consent on 02/21/2020 for a virtual visit (video or telephone).   CONSENT FOR VIRTUAL VISIT FOR:  [name]  By participating in this virtual visit I agree to the following:  I hereby voluntarily request, consent and authorize [facility] and its employed or contracted physicians, physician assistants, nurse practitioners or other licensed health care professionals (the Practitioner), to provide me with telemedicine health care services (the "Services") as deemed necessary by the treating Practitioner. I acknowledge and consent to receive the Services by the Practitioner via telemedicine. I understand that the telemedicine visit will involve communicating with the Practitioner through live audiovisual communication technology and the disclosure of certain medical information by electronic transmission. I acknowledge that I have been given the opportunity to request an in-person assessment or other available alternative prior to the telemedicine visit and am voluntarily participating in the telemedicine visit.  I understand that I have the right to withhold or withdraw my consent to the use of telemedicine in the course of my care at any time, without affecting my right to future care or treatment, and that the Practitioner or I may terminate the telemedicine visit at any time. I understand that I have the right to inspect all information obtained and/or recorded in the course of the telemedicine visit and may receive copies of available information for a reasonable fee.  I understand that some of the potential risks of receiving the Services via telemedicine include:  Marland Kitchen Delay or interruption in medical evaluation due to technological equipment failure or disruption; . Information transmitted may not be sufficient (e.g. poor resolution of images) to allow for appropriate medical decision making by the  Practitioner; and/or  . In rare instances, security protocols could fail, causing a breach of personal health information.  Furthermore, I acknowledge that it is my responsibility to provide information about my medical history, conditions and care that is complete and accurate to the best of my ability. I acknowledge that Practitioner's advice, recommendations, and/or decision may be based on factors not within their control, such as incomplete or inaccurate data provided by me or distortions of diagnostic images or specimens that may result from electronic transmissions. I understand that the practice of medicine is not an exact science and that Practitioner makes no warranties or guarantees regarding treatment outcomes. I acknowledge that a copy of this consent can be made available to me via my patient portal ([facility]), or I can request a printed copy by calling the office of [facility].    I understand that my insurance will be billed for this visit.   I have read or had this consent read to me. . I understand the contents of this consent, which adequately explains the benefits and risks of the Services being provided via telemedicine.  . I have been provided ample opportunity to ask questions regarding this consent and the Services and have had my questions answered to my satisfaction. . I give my informed consent for the services to be provided through the use of telemedicine in my medical care

## 2020-02-25 NOTE — Progress Notes (Signed)
Telehealth Visit     Virtual Visit via Telephone Note   This visit type was conducted due to national recommendations for restrictions regarding the COVID-19 Pandemic (e.g. social distancing) in an effort to limit this patient's exposure and mitigate transmission in our community.  Due to her co-morbid illnesses, this patient is at least at moderate risk for complications without adequate follow up.  This format is felt to be most appropriate for this patient at this time.  The patient did not have access to video technology/had technical difficulties with video requiring transitioning to audio format only (telephone).  All issues noted in this document were discussed and addressed.  No physical exam could be performed with this format.  Please refer to the patient's chart for her  consent to telehealth for [facility].   Evaluation Performed:  Follow-up visit   The patient was identified using 2 identifiers.   This visit type was conducted due to national recommendations for restrictions regarding the COVID-19 Pandemic (e.g. social distancing).  This format is felt to be most appropriate for this patient at this time.  All issues noted in this document were discussed and addressed.  No physical exam was performed (except for noted visual exam findings with Video Visits).  Please refer to the patient's chart (MyChart message for video visits and phone note for telephone visits) for the patient's consent to telehealth for [facility].  Date:  03/03/2020   ID:  [name], DOB 07-14-1940, MRN [id number]  Patient Location:  Home  Provider location:   Home  PCP:  [name], MD  Cardiologist:   Jenkins Rouge, MD  Electrophysiologist:  None   Chief Complaint:  Follow up  History of Present Illness:    [name] is a 79 y.o. female who presents via audio/video conferencing for a telehealth visit today.  Seen for [name].   She has a history of HTN, OSA with  intolerance to CPAP and oral appliance, HLD, carotid disease, PAF - on Flecainide and on prior anticoagulation with Coumadin and then Xarelto (stopped due to falls), and progressive Parkinson's/PSP disorder. Remote cath in 2002 with normal coronaries.  Last Myoview in 2015.   Admitted 6/29-7/14/19 for discitis/osteomyelitis with concerns of abscess versus old hematoma. S/p total facetectomy, revision of all construct and new fusion construct of T9-L3 by neurosurgery 7/8. ID recommended abx until 07/2018.  Last seen by [name] in January. Discussed changing over to Xarelto.  Multiple ER visits since her last visit with [place] - mainly for falls - her anticoagulation has been stopped.   The patient does not have symptoms concerning for COVID-19 infection (fever, chills, cough, or new shortness of breath).   Seen today by telephone call. She has consented for this visit. She feels as if she is doing well. No chest pain. No real palpitations. She has had several falls. She fell last week again. No serious injury. She had a recommendation.for an electric wheelchair and to stop walking due to falls. She gets therapy at her house and her BP is typically ok.     Past Medical History:  Diagnosis Date  . Allergic rhinitis   . Arthritis    "all over"  . CHF (congestive heart failure) ([place])   . Chronic back pain   . DJD (degenerative joint disease)   . 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 GERD (gastroesophageal reflux disease)   . Hyperlipidemia   . Hypertension   . Lumbar stenosis   .  OSA (obstructive sleep apnea)    "couldn't handle CPAP; use mouth guard some; not all the time" (01/06/2014)  . PAF (paroxysmal atrial fibrillation) ([place])   . Scoliosis    with radiculopathy L2-S1 with prior surgery  . Small bowel obstruction (HCC)    versus ileus after last bck surgery  . Spondylosis    Past Surgical History:  Procedure Laterality Date  . BACK SURGERY    . CATARACT EXTRACTION W/ INTRAOCULAR LENS   IMPLANT, BILATERAL  2011  . CHOLECYSTECTOMY N/A 07/07/2018   Procedure: LAPAROSCOPIC CHOLECYSTECTOMY;  Surgeon: Erroll Luna, MD;  Location: [facility];  Service: General;  Laterality: N/A;  . COLONOSCOPY  12/09/2004  . LIPOMA EXCISION  1980's   "fatty tumors"  . [place] SURGERY  02/2009   "ruptured disc"  . NASAL SEPTUM SURGERY  80's  . POSTERIOR LUMBAR FUSION  06/2010; 10/2011   "placed screws, rods, spacers both times"  . POSTERIOR LUMBAR FUSION 4 LEVEL N/A 12/13/2018   Procedure: THORACIC NINE -LUMBAR THREE POSTERIOR INSTRUMENTATION FUSION;  Surgeon: Judith Part, MD;  Location: [facility];  Service: Neurosurgery;  Laterality: N/A;  . REPAIR DURAL / CSF LEAK  02/2009  . THORACIC LAMINECTOMY FOR EPIDURAL ABSCESS N/A 09/28/2018   Procedure: THORACIC NINE-THORACIC TEN, THORACIC TEN-THORACIC ELEVEN, THORACIC ELEVEN-THORACIC TWELVE LAMINECTOMIES FOR EPIDURAL ABSCESS;  Surgeon: Judith Part, MD;  Location: [facility];  Service: Neurosurgery;  Laterality: N/A;  . TOTAL ABDOMINAL HYSTERECTOMY  02/1993     Current Meds  Medication Sig  . acetaminophen (TYLENOL 8 HOUR) 650 MG CR tablet Take 1 tablet (650 mg total) by mouth every 8 (eight) hours as needed for pain.  . carbidopa-levodopa (SINEMET IR) 25-100 MG tablet Take 2 tablets by mouth 3 (three) times daily. (Patient taking differently: Take 2 tablets by mouth 3 (three) times daily. 8am, noon and 4pm)  . Cholecalciferol (HM VITAMIN D3) 50 MCG (2000 UT) CAPS Take 8,000 Units by mouth at bedtime.   . Cranberry-Vitamin C (AZO CRANBERRY URINARY TRACT PO) Take 2 tablets by mouth as needed (urinary).  . DULoxetine (CYMBALTA) 60 MG capsule Take 60 mg by mouth 2 (two) times daily with a meal.  . flecainide (TAMBOCOR) 50 MG tablet Take 1 tablet (50 mg total) by mouth 2 (two) times daily. (Patient taking differently: Take 50 mg by mouth 2 (two) times daily with a meal. )  . furosemide (LASIX) 20 MG tablet Take 1 tablet (20 mg total) by mouth daily.  Marland Kitchen  gabapentin (NEURONTIN) 300 MG capsule Take 300 mg by mouth See admin instructions. Take one capsule (300 mg) by mouth daily with lunch and supper, take two capsules (600 mg) at bedtime  . Melatonin 10 MG TABS Take 1 tablet by mouth at bedtime.  . Menthol, Topical Analgesic, (BIOFREEZE EX) Apply 1 application topically daily as needed (pain).   . metoprolol succinate (TOPROL-XL) 25 MG 24 hr tablet Take 12.5 mg by mouth daily.  . mirabegron ER (MYRBETRIQ) 50 MG TB24 tablet Take 50 mg by mouth daily.  . pantoprazole (PROTONIX) 40 MG tablet Take 40 mg by mouth daily.   . polyethylene glycol (MIRALAX / GLYCOLAX) packet Take 17 g by mouth daily. (Patient taking differently: Take 17 g by mouth daily as needed for mild constipation. )  . potassium chloride SA (K-DUR) 20 MEQ tablet Take 1 tablet (20 mEq total) by mouth daily.  . pravastatin (PRAVACHOL) 40 MG tablet Take 40 mg by mouth daily with lunch.   Marland Kitchen  Pumpkin Seed-Soy Germ (AZO BLADDER CONTROL/GO-LESS PO) Take 1 capsule by mouth in the morning and at bedtime.  . rivaroxaban (XARELTO) 20 MG TABS tablet Take 20 mg by mouth daily with supper.  . TRAVATAN Z 0.004 % SOLN ophthalmic solution Place 1 drop into both eyes at bedtime.   . [DISCONTINUED] clonazePAM (KLONOPIN) 0.5 MG tablet Take 0.5 tablets (0.25 mg total) by mouth at bedtime.     Allergies:   Lyrica [pregabalin], Zocor [simvastatin], and Relafen [nabumetone]   Social History   Tobacco Use  . Smoking status: Never Smoker  . Smokeless tobacco: Never Used  Vaping Use  . Vaping Use: Never used  Substance Use Topics  . Alcohol use: No    Alcohol/week: 0.0 standard drinks  . Drug use: No     Family Hx: The patient's family history includes Allergies in her sister; Arthritis in her mother; Asthma in her sister; Heart attack in her father; Hypertension in her sister; Lung cancer in her sister.  ROS:   Please see the history of present illness.   All other systems reviewed are negative.     Objective:    Vital Signs:  BP (!) 144/82   Pulse 81   Ht 5\' 5"  (1.651 m)   Wt 199 lb (90.3 kg)   BMI 33.12 kg/m    Wt Readings from Last 3 Encounters:  03/03/20 199 lb (90.3 kg)  02/14/20 199 lb (90.3 kg)  12/25/19 185 lb (83.9 kg)    Alert female in no acute distress. Sounds appropriate. Weight is up.    Labs/Other Tests and Data Reviewed:    Lab Results  Component Value Date   WBC 6.2 12/25/2019   HGB 11.2 (L) 12/25/2019   HCT 35.4 (L) 12/25/2019   PLT 170 12/25/2019   GLUCOSE 111 (H) 12/25/2019   CHOL  12/22/2006    183        ATP III CLASSIFICATION:  <200     mg/dL   Desirable  200-239  mg/dL   Borderline High  >=240    mg/dL   High   TRIG 174 (H) 12/22/2006   HDL 47 12/22/2006   LDLCALC (H) 12/22/2006    101        Total Cholesterol/HDL:CHD Risk Coronary Heart Disease Risk Table                     Men   Women  1/2 Average Risk   3.4   3.3   ALT 5 12/25/2019   AST 25 12/25/2019   NA 139 12/25/2019   K 4.8 12/25/2019   CL 102 12/25/2019   CREATININE 0.94 12/25/2019   BUN 22 12/25/2019   CO2 26 12/25/2019   TSH 1.298 12/25/2019   INR 1.9 (A) 07/06/2019   HGBA1C 6.0 (H) 02/27/2013     BNP (last 3 results) Recent Labs    12/25/19 2205  BNP 43.7    ProBNP (last 3 results) No results for input(s): PROBNP in the last 8760 hours.      Prior CV studies:    The following studies were reviewed today:  N/A   ASSESSMENT & PLAN:    1. PAF - no reports of palpitations - last EKG from July showed that she was in sinus. She is no longer on Xarelto.   2. Prior anticoagulation - stopped due to falls - I agree with this.   3. OSA - intolerance to CPAP and oral appliance  4. History  of orthostatic hypotension with history of HTN - has some lability with her readings - would follow for now - no changes made today.   5. Parkinson's/PSP disorder - followed by neurology - this seems progressive and the crux of her issues at this time.   6.  Carotid disease - last study in 2017 - may need to consider updating - had less than 50% narrowing at that time.   7. HLD - she is on statin - her labs from her [facility] are noted from April.   8. COVID-19 Education: The signs and symptoms of COVID-19 were discussed with the patient and how to seek care for testing (follow up with PCP or arrange E-visit).  The importance of social distancing, staying at home, hand hygiene and wearing a mask when out in public were discussed today.  Patient Risk:   After full review of this patient's clinical status, I feel that they are at least moderate risk at this time.  Time:   Today, I have spent 6 minutes with the patient with telehealth technology discussing the above issues.     Medication Adjustments/Labs and Tests Ordered: Current medicines are reviewed at length with the patient today.  Concerns regarding medicines are outlined above.   Tests Ordered: No orders of the defined types were placed in this encounter.   Medication Changes: No orders of the defined types were placed in this encounter.   Disposition:  FU with [name] in 6 months with EKG.    Patient is agreeable to this plan and will call if any problems develop in the interim.   Amie Critchley, NP  03/03/2020 2:15 PM    [facility]

## 2020-02-26 DIAGNOSIS — N189 Chronic kidney disease, unspecified: Secondary | ICD-10-CM | POA: Diagnosis not present

## 2020-02-26 DIAGNOSIS — Z7901 Long term (current) use of anticoagulants: Secondary | ICD-10-CM | POA: Diagnosis not present

## 2020-02-26 DIAGNOSIS — N3946 Mixed incontinence: Secondary | ICD-10-CM | POA: Diagnosis not present

## 2020-02-26 DIAGNOSIS — D631 Anemia in chronic kidney disease: Secondary | ICD-10-CM | POA: Diagnosis not present

## 2020-02-26 DIAGNOSIS — F419 Anxiety disorder, unspecified: Secondary | ICD-10-CM | POA: Diagnosis not present

## 2020-02-26 DIAGNOSIS — G20 Parkinson's disease: Secondary | ICD-10-CM | POA: Diagnosis not present

## 2020-02-26 DIAGNOSIS — R310 Gross hematuria: Secondary | ICD-10-CM | POA: Diagnosis not present

## 2020-02-26 DIAGNOSIS — I4891 Unspecified atrial fibrillation: Secondary | ICD-10-CM | POA: Diagnosis not present

## 2020-02-26 DIAGNOSIS — M4714 Other spondylosis with myelopathy, thoracic region: Secondary | ICD-10-CM | POA: Diagnosis not present

## 2020-02-26 DIAGNOSIS — F0280 Dementia in other diseases classified elsewhere without behavioral disturbance: Secondary | ICD-10-CM | POA: Diagnosis not present

## 2020-02-26 DIAGNOSIS — G629 Polyneuropathy, unspecified: Secondary | ICD-10-CM | POA: Diagnosis not present

## 2020-02-26 DIAGNOSIS — I130 Hypertensive heart and chronic kidney disease with heart failure and stage 1 through stage 4 chronic kidney disease, or unspecified chronic kidney disease: Secondary | ICD-10-CM | POA: Diagnosis not present

## 2020-02-26 DIAGNOSIS — K219 Gastro-esophageal reflux disease without esophagitis: Secondary | ICD-10-CM | POA: Diagnosis not present

## 2020-02-26 DIAGNOSIS — I509 Heart failure, unspecified: Secondary | ICD-10-CM | POA: Diagnosis not present

## 2020-02-26 DIAGNOSIS — G4700 Insomnia, unspecified: Secondary | ICD-10-CM | POA: Diagnosis not present

## 2020-02-26 DIAGNOSIS — G4733 Obstructive sleep apnea (adult) (pediatric): Secondary | ICD-10-CM | POA: Diagnosis not present

## 2020-03-03 ENCOUNTER — Other Ambulatory Visit: Payer: Self-pay

## 2020-03-03 ENCOUNTER — Telehealth (INDEPENDENT_AMBULATORY_CARE_PROVIDER_SITE_OTHER): Payer: Medicare Other | Admitting: Nurse Practitioner

## 2020-03-03 ENCOUNTER — Encounter: Payer: Self-pay | Admitting: Nurse Practitioner

## 2020-03-03 VITALS — BP 144/82 | HR 81 | Ht 65.0 in | Wt 199.0 lb

## 2020-03-03 DIAGNOSIS — I10 Essential (primary) hypertension: Secondary | ICD-10-CM

## 2020-03-03 DIAGNOSIS — N183 Chronic kidney disease, stage 3 unspecified: Secondary | ICD-10-CM | POA: Diagnosis not present

## 2020-03-03 DIAGNOSIS — I4891 Unspecified atrial fibrillation: Secondary | ICD-10-CM | POA: Diagnosis not present

## 2020-03-03 DIAGNOSIS — I480 Paroxysmal atrial fibrillation: Secondary | ICD-10-CM | POA: Diagnosis not present

## 2020-03-03 DIAGNOSIS — I5032 Chronic diastolic (congestive) heart failure: Secondary | ICD-10-CM | POA: Diagnosis not present

## 2020-03-03 DIAGNOSIS — I129 Hypertensive chronic kidney disease with stage 1 through stage 4 chronic kidney disease, or unspecified chronic kidney disease: Secondary | ICD-10-CM | POA: Diagnosis not present

## 2020-03-03 DIAGNOSIS — Z7189 Other specified counseling: Secondary | ICD-10-CM

## 2020-03-03 DIAGNOSIS — E7800 Pure hypercholesterolemia, unspecified: Secondary | ICD-10-CM | POA: Diagnosis not present

## 2020-03-03 DIAGNOSIS — I951 Orthostatic hypotension: Secondary | ICD-10-CM

## 2020-03-03 NOTE — Patient Instructions (Addendum)
After Visit Summary:  We will be checking the following labs today - NONE   Medication Instructions:    Continue with your current medicines.    If you need a refill on your cardiac medications before your next appointment, please call your pharmacy.     Testing/Procedures To Be Arranged:  N/A  Follow-Up:   See [name] in 6 months with EKG.      At [facility], you and your health needs are our priority.  As part of our continuing mission to provide you with exceptional heart care, we have created designated Provider Care Teams.  These Care Teams include your primary Cardiologist (physician) and Advanced Practice Providers (APPs -  Physician Assistants and Nurse Practitioners) who all work together to provide you with the care you need, when you need it.  Special Instructions:  . Stay safe, wash your hands for at least 20 seconds and wear a mask when needed.  . It was good to talk with you today.    Call the [facility] office at [phone] if you have any questions, problems or concerns.

## 2020-03-11 DIAGNOSIS — R350 Frequency of micturition: Secondary | ICD-10-CM | POA: Diagnosis not present

## 2020-03-13 DIAGNOSIS — I700 Atherosclerosis of aorta: Secondary | ICD-10-CM | POA: Diagnosis not present

## 2020-03-13 DIAGNOSIS — K5730 Diverticulosis of large intestine without perforation or abscess without bleeding: Secondary | ICD-10-CM | POA: Diagnosis not present

## 2020-03-13 DIAGNOSIS — K449 Diaphragmatic hernia without obstruction or gangrene: Secondary | ICD-10-CM | POA: Diagnosis not present

## 2020-03-13 DIAGNOSIS — R310 Gross hematuria: Secondary | ICD-10-CM | POA: Diagnosis not present

## 2020-03-28 DIAGNOSIS — I4891 Unspecified atrial fibrillation: Secondary | ICD-10-CM | POA: Diagnosis not present

## 2020-03-28 DIAGNOSIS — I129 Hypertensive chronic kidney disease with stage 1 through stage 4 chronic kidney disease, or unspecified chronic kidney disease: Secondary | ICD-10-CM | POA: Diagnosis not present

## 2020-03-28 DIAGNOSIS — E7800 Pure hypercholesterolemia, unspecified: Secondary | ICD-10-CM | POA: Diagnosis not present

## 2020-03-28 DIAGNOSIS — N183 Chronic kidney disease, stage 3 unspecified: Secondary | ICD-10-CM | POA: Diagnosis not present

## 2020-04-10 ENCOUNTER — Other Ambulatory Visit: Payer: Self-pay | Admitting: Neurology

## 2020-04-10 MED ORDER — CARBIDOPA-LEVODOPA 25-100 MG PO TABS: 2.0000 | ORAL_TABLET | Freq: Three times a day (TID) | ORAL | 1 refills | Status: DC

## 2020-04-10 NOTE — Telephone Encounter (Signed)
Rx(s) sent to pharmacy electronically.  Patient notified and voiced understanding.  

## 2020-04-10 NOTE — Telephone Encounter (Signed)
Patient called and said her pharmacy has been trying to get her carbidopa-levodopa 25-100 MG prescription refilled but they have not gotten a response from the office.  [facility] on [address].

## 2020-04-24 DIAGNOSIS — Z9989 Dependence on other enabling machines and devices: Secondary | ICD-10-CM | POA: Diagnosis not present

## 2020-04-24 DIAGNOSIS — R531 Weakness: Secondary | ICD-10-CM | POA: Diagnosis not present

## 2020-04-24 DIAGNOSIS — G20 Parkinson's disease: Secondary | ICD-10-CM | POA: Diagnosis not present

## 2020-04-24 DIAGNOSIS — G2581 Restless legs syndrome: Secondary | ICD-10-CM | POA: Diagnosis not present

## 2020-04-25 DIAGNOSIS — N3946 Mixed incontinence: Secondary | ICD-10-CM | POA: Diagnosis not present

## 2020-04-25 DIAGNOSIS — R300 Dysuria: Secondary | ICD-10-CM | POA: Diagnosis not present

## 2020-04-27 ENCOUNTER — Other Ambulatory Visit: Payer: Self-pay | Admitting: Physician Assistant

## 2020-05-12 ENCOUNTER — Ambulatory Visit: Payer: Medicare Other | Admitting: Dermatology

## 2020-05-12 ENCOUNTER — Other Ambulatory Visit: Payer: Self-pay

## 2020-05-12 DIAGNOSIS — D692 Other nonthrombocytopenic purpura: Secondary | ICD-10-CM | POA: Diagnosis not present

## 2020-05-12 DIAGNOSIS — L570 Actinic keratosis: Secondary | ICD-10-CM

## 2020-05-12 DIAGNOSIS — Z1283 Encounter for screening for malignant neoplasm of skin: Secondary | ICD-10-CM | POA: Diagnosis not present

## 2020-05-17 ENCOUNTER — Encounter: Payer: Self-pay | Admitting: Dermatology

## 2020-05-17 NOTE — Progress Notes (Signed)
   Follow-Up Visit   Subjective  [name] is a 79 y.o. female who presents for the following: Annual Exam (Concerns Dry crusty on upper lip area. ).  Spots over lip, bruise on arms, quick skin check Location:  Duration:  Quality:  Associated Signs/Symptoms: Modifying Factors:  Severity:  Timing: Context:   Objective  Well appearing patient in no apparent distress; mood and affect are within normal limits. Objective  Head - Anterior (Face): Sun exposed areas, back, and upper chest examined.  No signs of atypical moles, melanoma or non mole skin cancer.  Objective  Right Forearm - Posterior: Half dozen 1 cm ecchymoses forearms.  Denies any other bleeding issues.  Objective  Left Upper Cutaneous Lip, Philtrum, Right Upper Cutaneous Lip: Small flat pink 2 to 3 mm crusts, historically stable and currently not bothersome.  She has many evolving comorbidities.   All sun exposed areas plus back examined.   Assessment & Plan    Skin exam for malignant neoplasm Head - Anterior (Face)  Yearly skin check  Solar purpura [facility]) Right Forearm - Posterior  May try Dermend if the bruising or fragile skin on the forearms becomes bothersome.  AK (actinic keratosis) (3) Left Upper Cutaneous Lip; Right Upper Cutaneous Lip; Philtrum  Defer intervention unless lesions grow, bleed, or become symptomatic.     I, Lavonna Monarch, MD, have reviewed all documentation for this visit.  The documentation on 05/17/20 for the exam, diagnosis, procedures, and orders are all accurate and complete.

## 2020-05-26 ENCOUNTER — Other Ambulatory Visit: Payer: Self-pay | Admitting: Cardiovascular Disease

## 2020-06-03 ENCOUNTER — Inpatient Hospital Stay (HOSPITAL_COMMUNITY)
Admission: EM | Admit: 2020-06-03 | Discharge: 2020-06-07 | DRG: 177 | Disposition: A | Discharge status: Home Health | Payer: Medicare Other | Attending: Internal Medicine | Admitting: Internal Medicine

## 2020-06-03 ENCOUNTER — Emergency Department (HOSPITAL_COMMUNITY): Payer: Medicare Other

## 2020-06-03 DIAGNOSIS — J1282 Pneumonia due to coronavirus disease 2019: Secondary | ICD-10-CM | POA: Diagnosis present

## 2020-06-03 DIAGNOSIS — H409 Unspecified glaucoma: Secondary | ICD-10-CM | POA: Diagnosis present

## 2020-06-03 DIAGNOSIS — I130 Hypertensive heart and chronic kidney disease with heart failure and stage 1 through stage 4 chronic kidney disease, or unspecified chronic kidney disease: Secondary | ICD-10-CM | POA: Diagnosis present

## 2020-06-03 DIAGNOSIS — E785 Hyperlipidemia, unspecified: Secondary | ICD-10-CM | POA: Diagnosis present

## 2020-06-03 DIAGNOSIS — Z79899 Other long term (current) drug therapy: Secondary | ICD-10-CM

## 2020-06-03 DIAGNOSIS — G20 Parkinson's disease: Secondary | ICD-10-CM | POA: Diagnosis present

## 2020-06-03 DIAGNOSIS — Z825 Family history of asthma and other chronic lower respiratory diseases: Secondary | ICD-10-CM

## 2020-06-03 DIAGNOSIS — G8929 Other chronic pain: Secondary | ICD-10-CM | POA: Diagnosis present

## 2020-06-03 DIAGNOSIS — E669 Obesity, unspecified: Secondary | ICD-10-CM | POA: Diagnosis present

## 2020-06-03 DIAGNOSIS — G231 Progressive supranuclear ophthalmoplegia [Steele-Richardson-Olszewski]: Secondary | ICD-10-CM | POA: Diagnosis present

## 2020-06-03 DIAGNOSIS — Z8249 Family history of ischemic heart disease and other diseases of the circulatory system: Secondary | ICD-10-CM

## 2020-06-03 DIAGNOSIS — G9341 Metabolic encephalopathy: Secondary | ICD-10-CM | POA: Diagnosis present

## 2020-06-03 DIAGNOSIS — I5032 Chronic diastolic (congestive) heart failure: Secondary | ICD-10-CM | POA: Diagnosis present

## 2020-06-03 DIAGNOSIS — N1831 Chronic kidney disease, stage 3a: Secondary | ICD-10-CM | POA: Diagnosis present

## 2020-06-03 DIAGNOSIS — R4781 Slurred speech: Secondary | ICD-10-CM | POA: Diagnosis present

## 2020-06-03 DIAGNOSIS — Z981 Arthrodesis status: Secondary | ICD-10-CM

## 2020-06-03 DIAGNOSIS — Z888 Allergy status to other drugs, medicaments and biological substances status: Secondary | ICD-10-CM

## 2020-06-03 DIAGNOSIS — R0602 Shortness of breath: Secondary | ICD-10-CM

## 2020-06-03 DIAGNOSIS — I4891 Unspecified atrial fibrillation: Secondary | ICD-10-CM | POA: Diagnosis present

## 2020-06-03 DIAGNOSIS — F05 Delirium due to known physiological condition: Secondary | ICD-10-CM | POA: Diagnosis present

## 2020-06-03 DIAGNOSIS — R296 Repeated falls: Secondary | ICD-10-CM | POA: Diagnosis present

## 2020-06-03 DIAGNOSIS — Z6830 Body mass index (BMI) 30.0-30.9, adult: Secondary | ICD-10-CM

## 2020-06-03 DIAGNOSIS — U071 COVID-19: Secondary | ICD-10-CM

## 2020-06-03 DIAGNOSIS — G4733 Obstructive sleep apnea (adult) (pediatric): Secondary | ICD-10-CM | POA: Diagnosis present

## 2020-06-03 DIAGNOSIS — R509 Fever, unspecified: Secondary | ICD-10-CM

## 2020-06-03 DIAGNOSIS — Z801 Family history of malignant neoplasm of trachea, bronchus and lung: Secondary | ICD-10-CM

## 2020-06-03 DIAGNOSIS — G9349 Other encephalopathy: Secondary | ICD-10-CM | POA: Diagnosis present

## 2020-06-03 DIAGNOSIS — G20C Parkinsonism, unspecified: Secondary | ICD-10-CM | POA: Diagnosis present

## 2020-06-03 DIAGNOSIS — I480 Paroxysmal atrial fibrillation: Secondary | ICD-10-CM | POA: Diagnosis present

## 2020-06-03 DIAGNOSIS — K219 Gastro-esophageal reflux disease without esophagitis: Secondary | ICD-10-CM | POA: Diagnosis present

## 2020-06-03 DIAGNOSIS — M549 Dorsalgia, unspecified: Secondary | ICD-10-CM | POA: Diagnosis present

## 2020-06-03 DIAGNOSIS — G9340 Encephalopathy, unspecified: Secondary | ICD-10-CM

## 2020-06-03 DIAGNOSIS — Z66 Do not resuscitate: Secondary | ICD-10-CM | POA: Diagnosis present

## 2020-06-03 DIAGNOSIS — Z8261 Family history of arthritis: Secondary | ICD-10-CM

## 2020-06-03 LAB — COMPREHENSIVE METABOLIC PANEL
ALT: 8 U/L (ref 0–44)
AST: 23 U/L (ref 15–41)
Albumin: 4.2 g/dL (ref 3.5–5.0)
Alkaline Phosphatase: 119 U/L (ref 38–126)
Anion gap: 13 (ref 5–15)
BUN: 16 mg/dL (ref 8–23)
CO2: 26 mmol/L (ref 22–32)
Calcium: 9.2 mg/dL (ref 8.9–10.3)
Chloride: 95 mmol/L — ABNORMAL LOW (ref 98–111)
Creatinine, Ser: 1.08 mg/dL — ABNORMAL HIGH (ref 0.44–1.00)
GFR, Estimated: 53 mL/min — ABNORMAL LOW (ref >60)
Glucose, Bld: 99 mg/dL (ref 70–99)
Potassium: 4 mmol/L (ref 3.5–5.1)
Sodium: 134 mmol/L — ABNORMAL LOW (ref 135–145)
Total Bilirubin: 0.5 mg/dL (ref 0.3–1.2)
Total Protein: 6.8 g/dL (ref 6.5–8.1)

## 2020-06-03 LAB — CBC
HCT: 42.9 % (ref 36.0–46.0)
Hemoglobin: 12.9 g/dL (ref 12.0–15.0)
MCH: 27 pg (ref 26.0–34.0)
MCHC: 30.1 g/dL (ref 30.0–36.0)
MCV: 89.7 fL (ref 80.0–100.0)
Platelets: 132 10*3/uL — ABNORMAL LOW (ref 150–400)
RBC: 4.78 MIL/uL (ref 3.87–5.11)
RDW: 15.6 % — ABNORMAL HIGH (ref 11.5–15.5)
WBC: 7.2 10*3/uL (ref 4.0–10.5)
nRBC: 0 % (ref 0.0–0.2)

## 2020-06-03 MED ORDER — ACETAMINOPHEN 500 MG PO TABS
1000.0000 mg | ORAL_TABLET | Freq: Once | ORAL | Status: AC
  Administered 2020-06-03: 1000 mg via ORAL
  Filled 2020-06-03: qty 2

## 2020-06-03 NOTE — ED Triage Notes (Signed)
Pt from home via EMS for eval of slurred speech and confusion. Pt reports slurred speech started days ago but was first noticed by family today. No unilateral weakness or facial droop, although is having difficulty coordinating to walk. Pt's family reports she has had UTI with same presentation in the past, however family did a rapid UTI screening which was negative. A/O x 2, but is normally A/O x 4.

## 2020-06-04 ENCOUNTER — Emergency Department (HOSPITAL_COMMUNITY): Payer: Medicare Other

## 2020-06-04 ENCOUNTER — Other Ambulatory Visit: Payer: Self-pay

## 2020-06-04 DIAGNOSIS — F05 Delirium due to known physiological condition: Secondary | ICD-10-CM | POA: Diagnosis present

## 2020-06-04 DIAGNOSIS — I4891 Unspecified atrial fibrillation: Secondary | ICD-10-CM | POA: Diagnosis not present

## 2020-06-04 DIAGNOSIS — I480 Paroxysmal atrial fibrillation: Secondary | ICD-10-CM | POA: Diagnosis present

## 2020-06-04 DIAGNOSIS — R296 Repeated falls: Secondary | ICD-10-CM | POA: Diagnosis present

## 2020-06-04 DIAGNOSIS — Z6830 Body mass index (BMI) 30.0-30.9, adult: Secondary | ICD-10-CM | POA: Diagnosis not present

## 2020-06-04 DIAGNOSIS — J1282 Pneumonia due to coronavirus disease 2019: Secondary | ICD-10-CM | POA: Diagnosis present

## 2020-06-04 DIAGNOSIS — Z8261 Family history of arthritis: Secondary | ICD-10-CM | POA: Diagnosis not present

## 2020-06-04 DIAGNOSIS — U071 COVID-19: Principal | ICD-10-CM | POA: Diagnosis present

## 2020-06-04 DIAGNOSIS — M549 Dorsalgia, unspecified: Secondary | ICD-10-CM | POA: Diagnosis present

## 2020-06-04 DIAGNOSIS — E669 Obesity, unspecified: Secondary | ICD-10-CM | POA: Diagnosis present

## 2020-06-04 DIAGNOSIS — I130 Hypertensive heart and chronic kidney disease with heart failure and stage 1 through stage 4 chronic kidney disease, or unspecified chronic kidney disease: Secondary | ICD-10-CM | POA: Diagnosis present

## 2020-06-04 DIAGNOSIS — G9349 Other encephalopathy: Secondary | ICD-10-CM | POA: Diagnosis not present

## 2020-06-04 DIAGNOSIS — N1831 Chronic kidney disease, stage 3a: Secondary | ICD-10-CM | POA: Diagnosis present

## 2020-06-04 DIAGNOSIS — R4781 Slurred speech: Secondary | ICD-10-CM | POA: Diagnosis present

## 2020-06-04 DIAGNOSIS — K219 Gastro-esophageal reflux disease without esophagitis: Secondary | ICD-10-CM | POA: Diagnosis present

## 2020-06-04 DIAGNOSIS — G4733 Obstructive sleep apnea (adult) (pediatric): Secondary | ICD-10-CM | POA: Diagnosis present

## 2020-06-04 DIAGNOSIS — G8929 Other chronic pain: Secondary | ICD-10-CM | POA: Diagnosis present

## 2020-06-04 DIAGNOSIS — Z8249 Family history of ischemic heart disease and other diseases of the circulatory system: Secondary | ICD-10-CM | POA: Diagnosis not present

## 2020-06-04 DIAGNOSIS — Z888 Allergy status to other drugs, medicaments and biological substances status: Secondary | ICD-10-CM | POA: Diagnosis not present

## 2020-06-04 DIAGNOSIS — I5032 Chronic diastolic (congestive) heart failure: Secondary | ICD-10-CM | POA: Diagnosis not present

## 2020-06-04 DIAGNOSIS — G9341 Metabolic encephalopathy: Secondary | ICD-10-CM | POA: Diagnosis present

## 2020-06-04 DIAGNOSIS — Z66 Do not resuscitate: Secondary | ICD-10-CM | POA: Diagnosis present

## 2020-06-04 DIAGNOSIS — G20 Parkinson's disease: Secondary | ICD-10-CM | POA: Diagnosis present

## 2020-06-04 DIAGNOSIS — E785 Hyperlipidemia, unspecified: Secondary | ICD-10-CM | POA: Diagnosis present

## 2020-06-04 DIAGNOSIS — G231 Progressive supranuclear ophthalmoplegia [Steele-Richardson-Olszewski]: Secondary | ICD-10-CM | POA: Diagnosis present

## 2020-06-04 DIAGNOSIS — H409 Unspecified glaucoma: Secondary | ICD-10-CM | POA: Diagnosis present

## 2020-06-04 LAB — URINALYSIS, MICROSCOPIC (REFLEX)

## 2020-06-04 LAB — FERRITIN: Ferritin: 92 ng/mL (ref 11–307)

## 2020-06-04 LAB — RESP PANEL BY RT-PCR (FLU A&B, COVID) ARPGX2
Influenza A by PCR: NEGATIVE
Influenza B by PCR: NEGATIVE
SARS Coronavirus 2 by RT PCR: POSITIVE — AB

## 2020-06-04 LAB — C-REACTIVE PROTEIN: CRP: 8.8 mg/dL — ABNORMAL HIGH (ref <1.0)

## 2020-06-04 LAB — LACTIC ACID, PLASMA
Lactic Acid, Venous: 1.4 mmol/L (ref 0.5–1.9)
Lactic Acid, Venous: 1.2 mmol/L (ref 0.5–1.9)

## 2020-06-04 LAB — D-DIMER, QUANTITATIVE: D-Dimer, Quant: 1.1 ug/mL-FEU — ABNORMAL HIGH (ref 0.00–0.50)

## 2020-06-04 LAB — LACTATE DEHYDROGENASE: LDH: 181 U/L (ref 98–192)

## 2020-06-04 LAB — URINALYSIS, ROUTINE W REFLEX MICROSCOPIC
Bilirubin Urine: NEGATIVE
Glucose, UA: NEGATIVE mg/dL
Ketones, ur: NEGATIVE mg/dL
Leukocytes,Ua: NEGATIVE
Nitrite: NEGATIVE
Protein, ur: NEGATIVE mg/dL
Specific Gravity, Urine: >1.03 — ABNORMAL HIGH (ref 1.005–1.030)
pH: 5.5 (ref 5.0–8.0)

## 2020-06-04 LAB — PROCALCITONIN: Procalcitonin: <0.1 ng/mL

## 2020-06-04 LAB — FIBRINOGEN: Fibrinogen: 494 mg/dL — ABNORMAL HIGH (ref 210–475)

## 2020-06-04 MED ORDER — FLECAINIDE ACETATE 50 MG PO TABS
50.0000 mg | ORAL_TABLET | Freq: Two times a day (BID) | ORAL | Status: DC
  Administered 2020-06-04 – 2020-06-07 (×6): 50 mg via ORAL
  Filled 2020-06-04 (×9): qty 1

## 2020-06-04 MED ORDER — PANTOPRAZOLE SODIUM 40 MG PO TBEC
40.0000 mg | DELAYED_RELEASE_TABLET | Freq: Every day | ORAL | Status: DC
  Administered 2020-06-05 – 2020-06-07 (×3): 40 mg via ORAL
  Filled 2020-06-04 (×3): qty 1

## 2020-06-04 MED ORDER — SODIUM CHLORIDE 0.9 % IV SOLN: 250.0000 mL | INTRAVENOUS | Status: DC | PRN

## 2020-06-04 MED ORDER — ENOXAPARIN SODIUM 40 MG/0.4ML ~~LOC~~ SOLN
40.0000 mg | SUBCUTANEOUS | Status: DC
  Administered 2020-06-04 – 2020-06-06 (×3): 40 mg via SUBCUTANEOUS
  Filled 2020-06-04 (×3): qty 0.4

## 2020-06-04 MED ORDER — CARBIDOPA-LEVODOPA 25-100 MG PO TABS
2.0000 | ORAL_TABLET | Freq: Three times a day (TID) | ORAL | Status: DC
  Administered 2020-06-04 – 2020-06-07 (×9): 2 via ORAL
  Filled 2020-06-04 (×13): qty 2

## 2020-06-04 MED ORDER — ACETAMINOPHEN 325 MG PO TABS
650.0000 mg | ORAL_TABLET | Freq: Four times a day (QID) | ORAL | Status: DC | PRN
  Administered 2020-06-07: 650 mg via ORAL
  Filled 2020-06-04: qty 2

## 2020-06-04 MED ORDER — PREDNISONE 20 MG PO TABS: 50.0000 mg | ORAL_TABLET | Freq: Every day | ORAL | Status: DC

## 2020-06-04 MED ORDER — ONDANSETRON HCL 4 MG PO TABS: 4.0000 mg | ORAL_TABLET | Freq: Four times a day (QID) | ORAL | Status: DC | PRN

## 2020-06-04 MED ORDER — PRAVASTATIN SODIUM 40 MG PO TABS
40.0000 mg | ORAL_TABLET | Freq: Every day | ORAL | Status: DC
  Administered 2020-06-05 – 2020-06-07 (×3): 40 mg via ORAL
  Filled 2020-06-04 (×2): qty 1

## 2020-06-04 MED ORDER — SODIUM CHLORIDE 0.9% FLUSH
3.0000 mL | Freq: Two times a day (BID) | INTRAVENOUS | Status: DC
  Administered 2020-06-04 – 2020-06-07 (×6): 3 mL via INTRAVENOUS

## 2020-06-04 MED ORDER — HYDRALAZINE HCL 20 MG/ML IJ SOLN
5.0000 mg | INTRAMUSCULAR | Status: DC | PRN
  Administered 2020-06-05: 5 mg via INTRAVENOUS
  Filled 2020-06-04: qty 1

## 2020-06-04 MED ORDER — POLYETHYLENE GLYCOL 3350 17 G PO PACK: 17.0000 g | PACK | Freq: Every day | ORAL | Status: DC | PRN

## 2020-06-04 MED ORDER — FLEET ENEMA 7-19 GM/118ML RE ENEM: 1.0000 | ENEMA | Freq: Once | RECTAL | Status: DC | PRN

## 2020-06-04 MED ORDER — GABAPENTIN 300 MG PO CAPS
300.0000 mg | ORAL_CAPSULE | Freq: Two times a day (BID) | ORAL | Status: DC
  Administered 2020-06-05 – 2020-06-07 (×5): 300 mg via ORAL
  Filled 2020-06-04 (×5): qty 1

## 2020-06-04 MED ORDER — DULOXETINE HCL 60 MG PO CPEP
60.0000 mg | ORAL_CAPSULE | Freq: Two times a day (BID) | ORAL | Status: DC
  Administered 2020-06-04 – 2020-06-07 (×6): 60 mg via ORAL
  Filled 2020-06-04 (×8): qty 1

## 2020-06-04 MED ORDER — SODIUM CHLORIDE 0.9% FLUSH: 3.0000 mL | INTRAVENOUS | Status: DC | PRN

## 2020-06-04 MED ORDER — GUAIFENESIN-DM 100-10 MG/5ML PO SYRP: 10.0000 mL | ORAL_SOLUTION | ORAL | Status: DC | PRN

## 2020-06-04 MED ORDER — SODIUM CHLORIDE 0.9 % IV BOLUS
1000.0000 mL | Freq: Once | INTRAVENOUS | Status: AC
Start: 2020-06-04 — End: 2020-06-04
  Administered 2020-06-04: 1000 mL via INTRAVENOUS

## 2020-06-04 MED ORDER — ZINC SULFATE 220 (50 ZN) MG PO CAPS
220.0000 mg | ORAL_CAPSULE | Freq: Every day | ORAL | Status: DC
  Administered 2020-06-05 – 2020-06-07 (×3): 220 mg via ORAL
  Filled 2020-06-04 (×3): qty 1

## 2020-06-04 MED ORDER — LATANOPROST 0.005 % OP SOLN
1.0000 [drp] | Freq: Every day | OPHTHALMIC | Status: DC
  Administered 2020-06-06: 1 [drp] via OPHTHALMIC
  Filled 2020-06-04 (×2): qty 2.5

## 2020-06-04 MED ORDER — BISACODYL 5 MG PO TBEC: 5.0000 mg | DELAYED_RELEASE_TABLET | Freq: Every day | ORAL | Status: DC | PRN

## 2020-06-04 MED ORDER — OXYCODONE HCL 5 MG PO TABS
5.0000 mg | ORAL_TABLET | ORAL | Status: DC | PRN
Start: 2020-06-04 — End: 2020-06-07
  Administered 2020-06-05: 5 mg via ORAL
  Filled 2020-06-04: qty 1

## 2020-06-04 MED ORDER — RASAGILINE MESYLATE 1 MG PO TABS
1.0000 mg | ORAL_TABLET | Freq: Every day | ORAL | Status: DC
Start: 2020-06-05 — End: 2020-06-07
  Administered 2020-06-05 – 2020-06-07 (×3): 1 mg via ORAL
  Filled 2020-06-04 (×4): qty 1

## 2020-06-04 MED ORDER — HYDROCOD POLST-CPM POLST ER 10-8 MG/5ML PO SUER: 5.0000 mL | Freq: Two times a day (BID) | ORAL | Status: DC | PRN

## 2020-06-04 MED ORDER — SODIUM CHLORIDE 0.9 % IV SOLN
100.0000 mg | Freq: Every day | INTRAVENOUS | Status: DC
  Administered 2020-06-05 – 2020-06-07 (×3): 100 mg via INTRAVENOUS
  Filled 2020-06-04 (×5): qty 20

## 2020-06-04 MED ORDER — METHYLPREDNISOLONE SODIUM SUCC 125 MG IJ SOLR
45.0000 mg | Freq: Two times a day (BID) | INTRAMUSCULAR | Status: DC
Start: 2020-06-04 — End: 2020-06-05
  Administered 2020-06-04 – 2020-06-05 (×2): 45 mg via INTRAVENOUS
  Filled 2020-06-04 (×2): qty 2

## 2020-06-04 MED ORDER — ONDANSETRON HCL 4 MG/2ML IJ SOLN: 4.0000 mg | Freq: Four times a day (QID) | INTRAMUSCULAR | Status: DC | PRN

## 2020-06-04 MED ORDER — VIBEGRON 75 MG PO TABS: 75.0000 mg | ORAL_TABLET | Freq: Every day | ORAL | Status: DC

## 2020-06-04 MED ORDER — DOCUSATE SODIUM 100 MG PO CAPS
100.0000 mg | ORAL_CAPSULE | Freq: Two times a day (BID) | ORAL | Status: DC
  Administered 2020-06-05 – 2020-06-07 (×5): 100 mg via ORAL
  Filled 2020-06-04 (×5): qty 1

## 2020-06-04 MED ORDER — SODIUM CHLORIDE 0.9% FLUSH
3.0000 mL | Freq: Two times a day (BID) | INTRAVENOUS | Status: DC
  Administered 2020-06-04 – 2020-06-06 (×5): 3 mL via INTRAVENOUS

## 2020-06-04 MED ORDER — ALBUTEROL SULFATE HFA 108 (90 BASE) MCG/ACT IN AERS
2.0000 | INHALATION_SPRAY | RESPIRATORY_TRACT | Status: DC | PRN
  Filled 2020-06-04: qty 6.7

## 2020-06-04 MED ORDER — ASCORBIC ACID 500 MG PO TABS
500.0000 mg | ORAL_TABLET | Freq: Every day | ORAL | Status: DC
  Administered 2020-06-05 – 2020-06-07 (×3): 500 mg via ORAL
  Filled 2020-06-04 (×3): qty 1

## 2020-06-04 MED ORDER — SODIUM CHLORIDE 0.9 % IV SOLN
200.0000 mg | Freq: Once | INTRAVENOUS | Status: AC
  Administered 2020-06-04: 200 mg via INTRAVENOUS
  Filled 2020-06-04: qty 40

## 2020-06-04 MED ORDER — NYSTATIN 100000 UNIT/ML MT SUSP
5.0000 mL | Freq: Four times a day (QID) | OROMUCOSAL | Status: DC
  Administered 2020-06-04 – 2020-06-07 (×11): 500000 [IU] via ORAL
  Filled 2020-06-04 (×16): qty 5

## 2020-06-04 MED ORDER — MELATONIN 5 MG PO TABS
10.0000 mg | ORAL_TABLET | Freq: Every day | ORAL | Status: DC
  Administered 2020-06-04 – 2020-06-06 (×3): 10 mg via ORAL
  Filled 2020-06-04 (×6): qty 2

## 2020-06-04 MED ORDER — METOPROLOL SUCCINATE ER 25 MG PO TB24
12.5000 mg | ORAL_TABLET | Freq: Every day | ORAL | Status: DC
  Administered 2020-06-05 – 2020-06-07 (×3): 12.5 mg via ORAL
  Filled 2020-06-04 (×3): qty 1

## 2020-06-04 MED ORDER — GABAPENTIN 600 MG PO TABS
600.0000 mg | ORAL_TABLET | Freq: Every day | ORAL | Status: DC
Start: 2020-06-04 — End: 2020-06-07
  Administered 2020-06-04 – 2020-06-06 (×3): 600 mg via ORAL
  Filled 2020-06-04 (×5): qty 1

## 2020-06-04 NOTE — ED Notes (Signed)
Two attempts at blood draw made unsuccessfully. Phlebotomy asked to see pt.

## 2020-06-04 NOTE — ED Notes (Signed)
Dinner Tray Ordered @ 1714. 

## 2020-06-04 NOTE — ED Notes (Signed)
Dinner tray given

## 2020-06-04 NOTE — ED Notes (Addendum)
Daughter with pt- states that her mom started getting confused Sunday, had caregivers push fluids, check urine-- pt did not sleep well Sunday, Monday and spent the night in the waiting room last night, so is sleep deprived per daughter-- yesterday afternoon pt was confused, weak and unable to walk without help. Primary care dr directed pt to come to ED  Pt has had 2 vaccines

## 2020-06-04 NOTE — ED Notes (Signed)
Patient found attempting to get out of the bed  USGIV pulled, patient has no IV access at this time..  Will re consult IV team

## 2020-06-04 NOTE — ED Provider Notes (Signed)
[facility] EMERGENCY DEPARTMENT Provider Note   CSN: [id number] Arrival date & time: 06/03/20  1843     History Chief Complaint  Patient presents with  . Altered Mental Status    LEVEL 5 CAVEAT - ALTERED MENTAL STATUS  [name] is a 79 y.o. female with PMHx Parkinson's, PAF (not on anticoagulation due to frequent falls), CHF, HTN, HLD, GERD who presents to the ED via EMS for altered mental status. Daughter reports that 3 days ago she noticed that pt seemed more confused than normal. She states this typically occurs when pt has a UTI. She instructed the caregivers to start pushing fluids and to monitor her urine. She states that she smelled pt's urine yesterday and it was foul smelling. She took it to pt's PCP for UTI check and states that it was negative. Daughter reports that pt became more and more confused and became generally weak and unable to walk without assistance prompting her to bring pt to the ED via EMS. Prior to EMS coming daughter had caregivers check her temperature and noted it to be elevated. She denies any recent sick contacts; pt is vaccinated x2 with Moderna (last dose 07/17/19). Daughter denies pt complaining of any other symptoms including headache, neck stiffness, rash, chest pain, abdominal pain, nausea, vomiting, diarrhea, or any other associated symptoms. While in the waiting room pt began having a slight cough however daughter believes pt's throat is dry and she is trying to clear her throat causing the cough.   The history is provided by the patient, a relative and medical records.       Past Medical History:  Diagnosis Date  . Allergic rhinitis   . Arthritis    "all over"  . CHF (congestive heart failure) ([place])   . Chronic back pain   . DJD (degenerative joint disease)   . 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 GERD (gastroesophageal reflux disease)   . Hyperlipidemia   . Hypertension   . Lumbar stenosis   . OSA (obstructive sleep  apnea)    "couldn't handle CPAP; use mouth guard some; not all the time" (01/06/2014)  . PAF (paroxysmal atrial fibrillation) ([place])   . Scoliosis    with radiculopathy L2-S1 with prior surgery  . Small bowel obstruction ([facility])    versus ileus after last bck surgery  . Spondylosis     Patient Active Problem List   Diagnosis Date Noted  . 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 UTI (urinary tract infection) 12/06/2018  . Vertebral osteomyelitis ([place]) 12/05/2018  . Spinal stenosis 12/05/2018  . AKI (acute kidney injury) ([place]) 12/05/2018  . Acute metabolic encephalopathy [id number]  . Chronic diastolic (congestive) heart failure ([place]) 10/07/2018  . GERD (gastroesophageal reflux disease) 10/07/2018  . Abnormal glucose tolerance test 07/01/2018  . Colon, diverticulosis 07/01/2018  . 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 Glaucoma 07/01/2018  . Chronic kidney disease (CKD), stage III (moderate) ([place]) 07/01/2018  . Neuropathy 07/01/2018  . Pure hypercholesterolemia 07/01/2018  . Restless leg 07/01/2018  . Discitis of thoracolumbar region 07/01/2018  . Acute bronchitis due to infection 05/04/2017  . Cough 04/21/2016  . Preoperative cardiovascular examination 04/18/2015  . Parkinsonism ([place]) 05/15/2014  . Chronic anticoagulation -warfarin therapy 02/03/2014  . Atrial fibrillation with RVR ([place]) 01/31/2014  . Gait instability 11/16/2013  . Edema 03/03/2010  . ATRIAL FIBRILLATION 04/23/2008  . Hyperlipidemia 01/31/2008  . Obstructive sleep apnea 01/31/2008  . GLAUCOMA 01/30/2008  . Seasonal and perennial allergic rhinitis 01/30/2008  .  Osteoarthritis 01/30/2008    Past Surgical History:  Procedure Laterality Date  . BACK SURGERY    . CATARACT EXTRACTION W/ INTRAOCULAR LENS  IMPLANT, BILATERAL  2011  . CHOLECYSTECTOMY N/A 07/07/2018   Procedure: LAPAROSCOPIC CHOLECYSTECTOMY;  Surgeon: Erroll Luna, MD;  Location: [facility];  Service: General;  Laterality: N/A;  . COLONOSCOPY   12/09/2004  . LIPOMA EXCISION  1980's   "fatty tumors"  . [place] SURGERY  02/2009   "ruptured disc"  . NASAL SEPTUM SURGERY  80's  . POSTERIOR LUMBAR FUSION  06/2010; 10/2011   "placed screws, rods, spacers both times"  . POSTERIOR LUMBAR FUSION 4 LEVEL N/A 12/13/2018   Procedure: THORACIC NINE -LUMBAR THREE POSTERIOR INSTRUMENTATION FUSION;  Surgeon: Judith Part, MD;  Location: [facility];  Service: Neurosurgery;  Laterality: N/A;  . REPAIR DURAL / CSF LEAK  02/2009  . THORACIC LAMINECTOMY FOR EPIDURAL ABSCESS N/A 09/28/2018   Procedure: THORACIC NINE-THORACIC TEN, THORACIC TEN-THORACIC ELEVEN, THORACIC ELEVEN-THORACIC TWELVE LAMINECTOMIES FOR EPIDURAL ABSCESS;  Surgeon: Judith Part, MD;  Location: [facility];  Service: Neurosurgery;  Laterality: N/A;  . TOTAL ABDOMINAL HYSTERECTOMY  02/1993     OB History   No obstetric history on file.     Family History  Problem Relation Age of Onset  . Arthritis Mother   . Heart attack Father   . Hypertension Sister   . Lung cancer Sister   . Asthma Sister   . Allergies Sister     Social History   Tobacco Use  . Smoking status: Never Smoker  . Smokeless tobacco: Never Used  Vaping Use  . Vaping Use: Never used  Substance Use Topics  . Alcohol use: No    Alcohol/week: 0.0 standard drinks  . Drug use: No    Home Medications Prior to Admission medications   Medication Sig Start Date End Date Taking? Authorizing Provider  acetaminophen (TYLENOL 8 HOUR) 650 MG CR tablet Take 1 tablet (650 mg total) by mouth every 8 (eight) hours as needed for pain. 12/17/19  Yes Aberman, Caroline C, PA-C  carbidopa-levodopa (SINEMET IR) 25-100 MG tablet Take 2 tablets by mouth 3 (three) times daily. 04/10/20  Yes Tat, Eustace Quail, DO  Cholecalciferol 50 MCG (2000 UT) CAPS Take 4,000 Units by mouth daily.   Yes [provider]  Cranberry-Vitamin C (AZO CRANBERRY URINARY TRACT PO) Take 2 tablets by mouth as needed (urinary).   Yes [provider]  DULoxetine (CYMBALTA) 60 MG capsule Take 60 mg by mouth 2 (two) times daily with a meal. 10/25/19  Yes [provider]  flecainide (TAMBOCOR) 50 MG tablet TAKE 1 TABLET(50 MG) BY MOUTH TWICE DAILY Patient taking differently: Take 50 mg by mouth 2 (two) times daily. 05/27/20  Yes Josue Hector, MD  furosemide (LASIX) 20 MG tablet TAKE 1 TABLET(20 MG) BY MOUTH DAILY Patient taking differently: Take 20 mg by mouth daily. 04/28/20  Yes Bhagat, Bhavinkumar, PA  gabapentin (NEURONTIN) 300 MG capsule Take 300-600 mg by mouth See admin instructions. Take one capsule (300 mg) by mouth daily with lunch and supper, and take two capsules (600 mg) at bedtime.   Yes [provider]  Melatonin 10 MG TABS Take 1 tablet by mouth at bedtime.   Yes [provider]  Menthol, Topical Analgesic, (BIOFREEZE EX) Apply 1 application topically daily as needed (pain).    Yes [provider]  metoprolol succinate (TOPROL-XL) 25 MG 24 hr tablet Take 12.5 mg by mouth  daily.   Yes [provider]  pantoprazole (PROTONIX) 40 MG tablet Take 40 mg by mouth daily.    Yes [provider]  polyethylene glycol (MIRALAX / GLYCOLAX) packet Take 17 g by mouth daily. Patient taking differently: Take 17 g by mouth daily as needed for mild constipation. 07/10/18  Yes Sheikh, Omair Latif, DO  potassium chloride SA (K-DUR) 20 MEQ tablet Take 1 tablet (20 mEq total) by mouth daily. 01/01/19  Yes Bhagat, Bhavinkumar, PA  pravastatin (PRAVACHOL) 40 MG tablet Take 40 mg by mouth daily with lunch.   Yes [provider]  rasagiline (AZILECT) 1 MG TABS tablet Take 1 mg by mouth daily. 05/24/20  Yes [provider]  TRAVATAN Z 0.004 % SOLN ophthalmic solution Place 1 drop into both eyes at bedtime.  01/21/15  Yes [provider]  Vibegron (GEMTESA) 75 MG TABS Take 75 mg by mouth daily. For bladder control issues.   Yes [provider]    Allergies     Lyrica [pregabalin], Zocor [simvastatin], and Relafen [nabumetone]  Review of Systems   Review of Systems  Unable to perform ROS: Mental status change  Constitutional: Positive for fever.  Respiratory: Positive for cough.   Gastrointestinal: Negative for abdominal pain, diarrhea, nausea and vomiting.  Genitourinary: Negative for dysuria.       + foul smelling urine  Neurological: Negative for facial asymmetry, weakness, numbness and headaches.  Psychiatric/Behavioral: Positive for confusion.  All other systems reviewed and are negative.   Physical Exam Updated Vital Signs BP (!) 190/82   Pulse 82   Temp 99.1 F (37.3 C) (Oral)   Resp 18   SpO2 96%   Physical Exam Vitals and nursing note reviewed.  Constitutional:      Appearance: She is not ill-appearing or diaphoretic.  HENT:     Head: Normocephalic and atraumatic.     Mouth/Throat:     Mouth: Mucous membranes are dry.  Eyes:     Extraocular Movements: Extraocular movements intact.     Conjunctiva/sclera: Conjunctivae normal.     Pupils: Pupils are equal, round, and reactive to light.  Neck:     Meningeal: Brudzinski's sign and Kernig's sign absent.  Cardiovascular:     Rate and Rhythm: Normal rate and regular rhythm.     Pulses: Normal pulses.  Pulmonary:     Effort: Pulmonary effort is normal.     Breath sounds: Normal breath sounds. No wheezing, rhonchi or rales.     Comments: Mild cough; able to speak in full sentences without difficulty Abdominal:     Palpations: Abdomen is soft.     Tenderness: There is no abdominal tenderness. There is no guarding or rebound.  Musculoskeletal:     Cervical back: Neck supple. No tenderness.  Skin:    General: Skin is warm and dry.  Neurological:     Mental Status: She is alert.     Comments: Alert and oriented to self, place, time and event. Able to recognize several objects in the room including pen and tape however when asked what the TV is she states "purewick."    Speech is fluent, clear without dysarthria or dysphasia.   Strength 5/5 in upper/lower extremities  Sensation intact in upper/lower extremities   No pronator drift.  Normal finger-to-nose and feet tapping.  CN I not tested  CN II grossly intact visual fields bilaterally. Did not visualize posterior eye.   CN III, IV, VI PERRLA and EOMs intact bilaterally  CN V Intact sensation to sharp and light touch to the face  CN VII facial movements symmetric  CN VIII not tested  CN IX, X no uvula deviation, symmetric rise of soft palate  CN XI 5/5 SCM and trapezius strength bilaterally  CN XII Midline tongue protrusion, symmetric L/R movements      ED Results / Procedures / Treatments   Labs (all labs ordered are listed, but only abnormal results are displayed) Labs Reviewed  RESP PANEL BY RT-PCR (FLU A&B, COVID) ARPGX2 - Abnormal; Notable for the following components:      Result Value   SARS Coronavirus 2 by RT PCR POSITIVE (*)    All other components within normal limits  COMPREHENSIVE METABOLIC PANEL - Abnormal; Notable for the following components:   Sodium 134 (*)    Chloride 95 (*)    Creatinine, Ser 1.08 (*)    GFR, Estimated 53 (*)    All other components within normal limits  CBC - Abnormal; Notable for the following components:   RDW 15.6 (*)    Platelets 132 (*)    All other components within normal limits  URINALYSIS, ROUTINE W REFLEX MICROSCOPIC - Abnormal; Notable for the following components:   APPearance HAZY (*)    Specific Gravity, Urine >1.030 (*)    Hgb urine dipstick TRACE (*)    All other components within normal limits  URINALYSIS, MICROSCOPIC (REFLEX) - Abnormal; Notable for the following components:   Bacteria, UA FEW (*)    All other components within normal limits  CULTURE, BLOOD (ROUTINE X 2)  CULTURE, BLOOD (ROUTINE X 2)  URINE CULTURE  LACTIC ACID, PLASMA  LACTIC ACID, PLASMA  CBG MONITORING, ED    EKG EKG  Interpretation  Date/Time:  Tuesday June 03 2020 18:50:45 EST Ventricular Rate:  88 PR Interval:  212 QRS Duration: 90 QT Interval:  346 QTC Calculation: 418 R Axis:   -4 Text Interpretation: Sinus rhythm with sinus arrhythmia with 1st degree A-V block Low voltage QRS Septal infarct , age undetermined Abnormal ECG When compared with ECG of 12/25/2019, No significant change was found Confirmed by Delora Fuel ([id number]) on 06/04/2020 12:15:15 AM   Radiology DG Chest 1 View  Result Date: 06/04/2020 CLINICAL DATA:  Slurred speech and difficulty with ambulation EXAM: CHEST  1 VIEW COMPARISON:  December 04, 2018 FINDINGS: Lungs are clear. Heart is borderline enlarged with pulmonary vascularity normal. No adenopathy. There is aortic atherosclerosis. There is postoperative change in the mid to lower thoracic spine and visualized upper lumbar spine regions. IMPRESSION: No edema or airspace opacity.  Heart borderline enlarged. Aortic Atherosclerosis (ICD10-I70.0). Electronically Signed   By: Lowella Grip III M.D.   On: 06/04/2020 09:26   CT Head Wo Contrast  Result Date: 06/03/2020 CLINICAL DATA:  Mental status change, unknown cause Confusion and slurred speech. EXAM: CT HEAD WITHOUT CONTRAST TECHNIQUE: Contiguous axial images were obtained from the base of the skull through the vertex without intravenous contrast. COMPARISON:  Head CT 12/25/2019 FINDINGS: Brain: Stable degree of atrophy and chronic small vessel ischemia no intracranial hemorrhage, mass effect, or midline shift. No hydrocephalus. The basilar cisterns are patent. Remote lacunar infarct versus prominent perivascular space in the left basal ganglia, unchanged. No evidence of territorial infarct or acute ischemia. No extra-axial or intracranial fluid collection. Vascular: Atherosclerosis of skullbase vasculature without hyperdense vessel or abnormal calcification. Skull: No fracture or focal lesion. Sinuses/Orbits: Paranasal sinuses and  mastoid air cells are clear. The visualized orbits are  unremarkable. Bilateral cataract resection. Other: None. IMPRESSION: 1. No acute intracranial abnormality. 2. Stable atrophy and chronic small vessel ischemia. Electronically Signed   By: Keith Rake M.D.   On: 06/03/2020 20:15    Procedures Procedures (including critical care time)  Medications Ordered in ED Medications  acetaminophen (TYLENOL) tablet 1,000 mg (1,000 mg Oral Given 06/03/20 1903)  sodium chloride 0.9 % bolus 1,000 mL (1,000 mLs Intravenous New Bag/Given 06/04/20 1331)    ED Course  I have reviewed the triage vital signs and the nursing notes.  Pertinent labs & imaging results that were available during my care of the patient were reviewed by me and considered in my medical decision making (see chart for details).  Clinical Course as of 06/04/20 1345  Wed Jun 04, 2020  1257 SARS Coronavirus 2 by RT PCR(!): POSITIVE [MV]    Clinical Course User Index [MV] Eustaquio Maize, PA-C   MDM Rules/Calculators/A&P                          79 year old female who presents to the ED today with complaint of altered mental status and increasing confusion for the past 3 days.  Daughter had her urine checked by PCP yesterday and it was negative however she began feeling generally weak and unable to ambulate prompting daughter to call EMS.  Was not aware she had a fever until EMS came.  On arrival to the ED patient is febrile at 101.9, nontachycardic, nontachypneic.  Blood pressure 162/77.  Oxygen saturation 97%.  Daughter reports that while patient was in the waiting room patient began having a cough however states that she has not had much to drink and feels like she is a dry throat and is clearing her throat due to this.  She denies any recent sick contacts.  Daughter is vaccinated with Covid however most recent dose in February.  Lab work was obtained while patient was in the waiting room.  CBC without leukocytosis and hemoglobin  stable at 12.9.  BMP with a sodium 134, chloride 95, creatinine slightly elevated at 1.08. Will provide fluids.  X-ray clear.  CT head negative.  Urinalysis with increased [place] gravity and trace hemoglobin on dipstick with few bacteria however 11-20 squamous epithelium.  No leuk esterase and no nitrites.  No white blood cells or red blood cells per high-power field.  Was in the waiting room for several hours prior to coming back.  Daughter reports she still seems confused to her.  On exam she has no focal neuro deficits.  She is alert times person place and situation.  She has no slurred speech or facial droop appreciated.  She is able to recognize several objects in the room however when I point to the TV she states that it does appear awake.  When I ask for clarification she states she does not know.  Patient attempted to ambulate while in the waiting room and was unable to do so, I would like to reambulate patient however will provide fluids at this time and reassess.  We will plan to swab for Covid as well.  No obvious findings to account for patient's fever at this time, I have very low suspicion for meningitis today she has no nuchal rigidity on exam.  We will continue to monitor.   COVID test has returned POSITIVE.  Had discussion with daughter regarding discharge home vs admission given pt's new confusion. Pt does have caregivers however unsure if they  will come with her being positive for COVID. I do think given her confusion and inability to ambulate without assistance it would be safer to admit at this time. Will consult for admission.   Discussed case with Dr. Ophelia [facility] who agrees to evaluate patient for admission.   This note was prepared using Dragon voice recognition software and may include unintentional dictation errors due to the inherent limitations of voice recognition software.  Final Clinical Impression(s) / ED Diagnoses Final diagnoses:  COVID-19  Encephalopathy     Rx / DC Orders ED Discharge Orders    None       Tanda Rockers, PA-C 06/04/20 1345    Alvira Monday, MD 06/04/20 2328

## 2020-06-04 NOTE — ED Notes (Signed)
Patient sleeping

## 2020-06-04 NOTE — ED Notes (Signed)
I told the [facility] about the patient fever

## 2020-06-04 NOTE — H&P (Addendum)
History and Physical    AFSA MEANY FAO:[id number] DOB: 30-Mar-1941 DOA: 06/03/2020  PCP: Blair Heys, MD Consultants:  Tat - neurology; Orvan Falconer - ID; [name] - cardiology; Blanche East - neurosurgery Patient coming from:  Home - lives with husband and has 24/7 caregivers; NOK: Daughter, Annabell Sabal, [phone]; Rockney Ghee, [phone]  Chief Complaint: Slurred speech, confusion  HPI: [name] is a 79 y.o. female with medical history significant of SBO; Parkinson's; afib (not on AC due to frequent falls); OSA; HTN; HLD; epidural abscess; chronic diastolic CHF; and chronic back pain presenting with slurred speech, confusion.  She received her 2nd COVID vaccine in February and has not received a booster.  She has not had respiratory symptoms but has been weak and confused for the last few days.  Another family member had COVID and home COVID test was positive.  (Her husband had a fever today and also had a home + test; current O2 sat is 90% with HR 115 and he is a heart transplant patient so he is also coming in for evaluation).   ED Course:  COVID +, encephalopathy.  Confused for a few days, ?UTI - pushing fluids and had UA at PCP yesterday (normal).  Weak, difficulty walking.  Fever to 101.9.  CXR ok.  Review of Systems: As per HPI; otherwise review of systems reviewed and negative.   Ambulatory Status:  Ambulates without assistance  COVID Vaccine Status:  Complete, no booster and last given in February  Past Medical History:  Diagnosis Date  . Allergic rhinitis   . Arthritis    "all over"  . CHF (congestive heart failure) (HCC)   . Chronic back pain   . DJD (degenerative joint disease)   . 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 GERD (gastroesophageal reflux disease)   . Hyperlipidemia   . Hypertension   . Lumbar stenosis   . OSA (obstructive sleep apnea)    "couldn't handle CPAP; use mouth guard some; not all the time" (01/06/2014)  . PAF (paroxysmal atrial  fibrillation) (HCC)   . Scoliosis    with radiculopathy L2-S1 with prior surgery  . Small bowel obstruction (HCC)    versus ileus after last bck surgery  . Spondylosis     Past Surgical History:  Procedure Laterality Date  . BACK SURGERY    . CATARACT EXTRACTION W/ INTRAOCULAR LENS  IMPLANT, BILATERAL  2011  . CHOLECYSTECTOMY N/A 07/07/2018   Procedure: LAPAROSCOPIC CHOLECYSTECTOMY;  Surgeon: Harriette Bouillon, MD;  Location: [facility] OR;  Service: General;  Laterality: N/A;  . COLONOSCOPY  12/09/2004  . LIPOMA EXCISION  1980's   "fatty tumors"  . LUMBAR DISC SURGERY  02/2009   "ruptured disc"  . NASAL SEPTUM SURGERY  80's  . POSTERIOR LUMBAR FUSION  06/2010; 10/2011   "placed screws, rods, spacers both times"  . POSTERIOR LUMBAR FUSION 4 LEVEL N/A 12/13/2018   Procedure: THORACIC NINE -LUMBAR THREE POSTERIOR INSTRUMENTATION FUSION;  Surgeon: Jadene Pierini, MD;  Location: [facility] OR;  Service: Neurosurgery;  Laterality: N/A;  . REPAIR DURAL / CSF LEAK  02/2009  . THORACIC LAMINECTOMY FOR EPIDURAL ABSCESS N/A 09/28/2018   Procedure: THORACIC NINE-THORACIC TEN, THORACIC TEN-THORACIC ELEVEN, THORACIC ELEVEN-THORACIC TWELVE LAMINECTOMIES FOR EPIDURAL ABSCESS;  Surgeon: Jadene Pierini, MD;  Location: [facility] OR;  Service: Neurosurgery;  Laterality: N/A;  . TOTAL ABDOMINAL HYSTERECTOMY  02/1993    Social History   Socioeconomic History  . Marital status: Married    Spouse name: [name]  . Number of  children: 2  . Years of education: 12th   . Highest education level: Not on file  Occupational History  . Occupation: retired    Fish farm manager: RETIRED    Comment: clerical office work  Tobacco Use  . Smoking status: Never Smoker  . Smokeless tobacco: Never Used  Vaping Use  . Vaping Use: Never used  Substance and Sexual Activity  . Alcohol use: No    Alcohol/week: 0.0 standard drinks  . Drug use: No  . Sexual activity: Never    Comment: hysterectomy  Other Topics Concern  . Not on file  Social  History Narrative   Patient lives at home with husband [name].   Patient is retired.   Patient has a high school education.    Patient has 2 children .   RIGHT HANDED   Social Determinants of Health   Financial Resource Strain: Not on file  Food Insecurity: Not on file  Transportation Needs: Not on file  Physical Activity: Not on file  Stress: Not on file  Social Connections: Not on file  Intimate Partner Violence: Not on file    Allergies  Allergen Reactions  . Lyrica [Pregabalin] Other (See Comments)    Felt loopy  . Zocor [Simvastatin] Other (See Comments)    Myalgias  . Relafen [Nabumetone] Rash    Family History  Problem Relation Age of Onset  . Arthritis Mother   . Heart attack Father   . Hypertension Sister   . Lung cancer Sister   . Asthma Sister   . Allergies Sister     Prior to Admission medications   Medication Sig Start Date End Date Taking? Authorizing Provider  acetaminophen (TYLENOL 8 HOUR) 650 MG CR tablet Take 1 tablet (650 mg total) by mouth every 8 (eight) hours as needed for pain. 12/17/19  Yes Aberman, Caroline C, PA-C  carbidopa-levodopa (SINEMET IR) 25-100 MG tablet Take 2 tablets by mouth 3 (three) times daily. 04/10/20  Yes Tat, Eustace Quail, DO  Cholecalciferol 50 MCG (2000 UT) CAPS Take 4,000 Units by mouth daily.   Yes [provider]  Cranberry-Vitamin C (AZO CRANBERRY URINARY TRACT PO) Take 2 tablets by mouth as needed (urinary).   Yes [provider]  DULoxetine (CYMBALTA) 60 MG capsule Take 60 mg by mouth 2 (two) times daily with a meal. 10/25/19  Yes [provider]  flecainide (TAMBOCOR) 50 MG tablet TAKE 1 TABLET(50 MG) BY MOUTH TWICE DAILY Patient taking differently: Take 50 mg by mouth 2 (two) times daily. 05/27/20  Yes Josue Hector, MD  furosemide (LASIX) 20 MG tablet TAKE 1 TABLET(20 MG) BY MOUTH DAILY Patient taking differently: Take 20 mg by mouth daily. 04/28/20  Yes Bhagat, Bhavinkumar, PA  gabapentin  (NEURONTIN) 300 MG capsule Take 300-600 mg by mouth See admin instructions. Take one capsule (300 mg) by mouth daily with lunch and supper, and take two capsules (600 mg) at bedtime.   Yes [provider]  Melatonin 10 MG TABS Take 1 tablet by mouth at bedtime.   Yes [provider]  Menthol, Topical Analgesic, (BIOFREEZE EX) Apply 1 application topically daily as needed (pain).    Yes [provider]  metoprolol succinate (TOPROL-XL) 25 MG 24 hr tablet Take 12.5 mg by mouth daily.   Yes [provider]  pantoprazole (PROTONIX) 40 MG tablet Take 40 mg by mouth daily.    Yes [provider]  polyethylene glycol (MIRALAX / GLYCOLAX) packet Take 17 g by mouth daily.  Patient taking differently: Take 17 g by mouth daily as needed for mild constipation. 07/10/18  Yes Sheikh, Omair Latif, DO  potassium chloride SA (K-DUR) 20 MEQ tablet Take 1 tablet (20 mEq total) by mouth daily. 01/01/19  Yes Bhagat, Bhavinkumar, PA  pravastatin (PRAVACHOL) 40 MG tablet Take 40 mg by mouth daily with lunch.   Yes [provider]  rasagiline (AZILECT) 1 MG TABS tablet Take 1 mg by mouth daily. 05/24/20  Yes [provider]  TRAVATAN Z 0.004 % SOLN ophthalmic solution Place 1 drop into both eyes at bedtime.  01/21/15  Yes [provider]  Vibegron (GEMTESA) 75 MG TABS Take 75 mg by mouth daily. For bladder control issues.   Yes [provider]    Physical Exam: Vitals:   06/04/20 1145 06/04/20 1200 06/04/20 1215 06/04/20 1330  BP: (!) 175/74 (!) 163/83 (!) 169/86 (!) 188/96  Pulse: 89 86 85 87  Resp: 19 19 19  (!) 21  Temp:    99 F (37.2 C)  TempSrc:    Oral  SpO2: 94% 99% 98% 99%     . General:  Appears calm and comfortable and is in NAD . Eyes:  PERRL, EOMI, normal lids, iris . ENT:  grossly normal hearing, lips & tongue, mildly dry mm, ?whitish tongue plaque - dryness vs. thrush . Neck:  no LAD, masses or  thyromegaly . Cardiovascular:  RRR, no m/r/g. No LE edema.  Marland Kitchen Respiratory:   CTA bilaterally with no wheezes/rales/rhonchi.  Normal respiratory effort. . Abdomen:  soft, NT, ND, NABS . Skin:  no rash or induration seen on limited exam . Musculoskeletal:  Mildly decreased tone BUE/BLE, good ROM, no bony abnormality . Psychiatric:  grossly normal mood and affect, speech fluent and generally appropriate . Neurologic:  CN 2-12 grossly intact, moves all extremities in coordinated fashion    Radiological Exams on Admission: Independently reviewed - see discussion in A/P where applicable  DG Chest 1 View  Result Date: 06/04/2020 CLINICAL DATA:  Slurred speech and difficulty with ambulation EXAM: CHEST  1 VIEW COMPARISON:  December 04, 2018 FINDINGS: Lungs are clear. Heart is borderline enlarged with pulmonary vascularity normal. No adenopathy. There is aortic atherosclerosis. There is postoperative change in the mid to lower thoracic spine and visualized upper lumbar spine regions. IMPRESSION: No edema or airspace opacity.  Heart borderline enlarged. Aortic Atherosclerosis (ICD10-I70.0). Electronically Signed   By: Lowella Grip III M.D.   On: 06/04/2020 09:26   CT Head Wo Contrast  Result Date: 06/03/2020 CLINICAL DATA:  Mental status change, unknown cause Confusion and slurred speech. EXAM: CT HEAD WITHOUT CONTRAST TECHNIQUE: Contiguous axial images were obtained from the base of the skull through the vertex without intravenous contrast. COMPARISON:  Head CT 12/25/2019 FINDINGS: Brain: Stable degree of atrophy and chronic small vessel ischemia no intracranial hemorrhage, mass effect, or midline shift. No hydrocephalus. The basilar cisterns are patent. Remote lacunar infarct versus prominent perivascular space in the left basal ganglia, unchanged. No evidence of territorial infarct or acute ischemia. No extra-axial or intracranial fluid collection. Vascular: Atherosclerosis of skullbase vasculature  without hyperdense vessel or abnormal calcification. Skull: No fracture or focal lesion. Sinuses/Orbits: Paranasal sinuses and mastoid air cells are clear. The visualized orbits are unremarkable. Bilateral cataract resection. Other: None. IMPRESSION: 1. No acute intracranial abnormality. 2. Stable atrophy and chronic small vessel ischemia. Electronically Signed   By: Keith Rake M.D.   On: 06/03/2020 20:15    EKG: Independently reviewed.  NSR with rate 88; first degree AV block; nonspecific ST changes with no evidence of acute ischemia   Labs on Admission: I have personally reviewed the available labs and imaging studies at the time of the admission.  Pertinent labs:   BUN 16/Creatinine 1.08/GFR 53 - stable WBC 7.2 Platelets 132 UA: trace Hgb, few bacteria COVID POSITIVE   Assessment/Plan Principal Problem:   Encephalopathy due to COVID-19 virus Active Problems:   Hyperlipidemia   Unspecified glaucoma   ATRIAL FIBRILLATION   Obstructive sleep apnea   Parkinsonism (HCC)   Stage 3a chronic kidney disease (HCC)   Chronic diastolic (congestive) heart failure (HCC)   Acute encephalopathy due to COVID-19 PNA -Patient with presenting with confusion and generalized weakness in the setting of known COVID-19 infection -Mild anorexia noted without the presence of other GI symptoms -She does not have a current O2 requirement  -COVID POSITIVE -The patient has comorbidities which may increase the risk for ARDS/MODS including: age, CKD, CHF -Pertinent labs concerning for COVID include normal WBC count; other covid labs are pending -CXR without opacities currently, but will monitor for development of COVID respiratory symptoms and consider rechecking -Will admit for further evaluation, close monitoring, and treatment -Monitor on telemetry x at least 24 hours -At this time, will attempt to avoid use of aerosolized medications and use HFAs instead -Will check daily labs including BMP with  Mag, Phos; LFTs; CBC with differential; CRP; ferritin; fibrinogen; D-dimer -Will order steroids and Remdesivir (pharmacy consult) given +COVID test and encephalopathy -Will encourage patient to ambulate with assistance when possible -Patient was seen wearing full PPE including: gown, gloves, head cover, N95, and face shield; donning and doffing was in compliance with current standards. -Telesitter requested  Afib -Rate controlled with metoprolol, Flecainide -Not on AC due to recurrent falls  Parkinsonism -Continue Sinemet, Cymbalta, Neurontin, Rasagiline  OSA -No CPAP for now given COVID infection  Stage 3a CKD -Appears to be stable at this time  HLD -Continue Pravachol  Chronic diastolic CHF -Preserved EF on 2015 echo -Appears to be compensated at this time    DVT prophylaxis:  Lovenox  Code Status:  DNR - confirmed with patient/daughter Family Communication: Daughter was present throughout evalaution. Disposition Plan:  The patient is from: home  Anticipated d/c is to: home, possibly with [facility] services  Anticipated d/c date will depend on clinical response to treatment, likely between 3 days (with completion of outpatient Remdesivir treatment) and 5 days  Patient is currently: acutely ill Consults called: PT/OT  Admission status: Admit - It is my clinical opinion that admission to [facility] is reasonable and necessary because of the expectation that this patient will require hospital care that crosses at least 2 midnights to treat this condition based on the medical complexity of the problems presented.  Given the aforementioned information, the predictability of an adverse outcome is felt to be significant.    Karmen Bongo MD [facility]   How to contact the [facility] Attending or Consulting provider [place] or covering provider during after hours [place], for this patient?  1. Check the care team in [facility] and look for a) attending/consulting TR[facility] provider listed and b)  the [facility] team listed 2. Log into [email or web address] and use [place]'s universal password to access. If you do not have the password, please contact the hospital operator. 3. Locate the [facility] provider you are looking for under [facility] and page to a number that you can be directly reached. 4. If  you still have difficulty reaching the provider, please page the [facility] (Director on Call) for the Hospitalists listed on amion for assistance.   06/04/2020, 3:57 PM

## 2020-06-04 NOTE — ED Notes (Signed)
IV team consulted for remaining lab work and access for meds.

## 2020-06-05 ENCOUNTER — Encounter (HOSPITAL_COMMUNITY): Payer: Self-pay | Admitting: Internal Medicine

## 2020-06-05 ENCOUNTER — Inpatient Hospital Stay (HOSPITAL_COMMUNITY): Payer: Medicare Other

## 2020-06-05 DIAGNOSIS — G9349 Other encephalopathy: Secondary | ICD-10-CM | POA: Diagnosis not present

## 2020-06-05 DIAGNOSIS — U071 COVID-19: Secondary | ICD-10-CM | POA: Diagnosis not present

## 2020-06-05 DIAGNOSIS — I5032 Chronic diastolic (congestive) heart failure: Secondary | ICD-10-CM | POA: Diagnosis not present

## 2020-06-05 DIAGNOSIS — I4891 Unspecified atrial fibrillation: Secondary | ICD-10-CM | POA: Diagnosis not present

## 2020-06-05 LAB — CBC WITH DIFFERENTIAL/PLATELET
Abs Immature Granulocytes: 0.02 10*3/uL (ref 0.00–0.07)
Basophils Absolute: 0 10*3/uL (ref 0.0–0.1)
Basophils Relative: 0 %
Eosinophils Absolute: 0 10*3/uL (ref 0.0–0.5)
Eosinophils Relative: 0 %
HCT: 40.5 % (ref 36.0–46.0)
Hemoglobin: 12.7 g/dL (ref 12.0–15.0)
Immature Granulocytes: 1 %
Lymphocytes Relative: 14 %
Lymphs Abs: 0.6 10*3/uL — ABNORMAL LOW (ref 0.7–4.0)
MCH: 27.5 pg (ref 26.0–34.0)
MCHC: 31.4 g/dL (ref 30.0–36.0)
MCV: 87.9 fL (ref 80.0–100.0)
Monocytes Absolute: 0.6 10*3/uL (ref 0.1–1.0)
Monocytes Relative: 16 %
Neutro Abs: 2.7 10*3/uL (ref 1.7–7.7)
Neutrophils Relative %: 69 %
Platelets: 116 10*3/uL — ABNORMAL LOW (ref 150–400)
RBC: 4.61 MIL/uL (ref 3.87–5.11)
RDW: 15.8 % — ABNORMAL HIGH (ref 11.5–15.5)
WBC: 4 10*3/uL (ref 4.0–10.5)
nRBC: 0 % (ref 0.0–0.2)

## 2020-06-05 LAB — COMPREHENSIVE METABOLIC PANEL
ALT: 6 U/L (ref 0–44)
AST: 23 U/L (ref 15–41)
Albumin: 3.7 g/dL (ref 3.5–5.0)
Alkaline Phosphatase: 96 U/L (ref 38–126)
Anion gap: 11 (ref 5–15)
BUN: 16 mg/dL (ref 8–23)
CO2: 22 mmol/L (ref 22–32)
Calcium: 8.8 mg/dL — ABNORMAL LOW (ref 8.9–10.3)
Chloride: 102 mmol/L (ref 98–111)
Creatinine, Ser: 0.84 mg/dL (ref 0.44–1.00)
GFR, Estimated: >60 mL/min (ref >60)
Glucose, Bld: 98 mg/dL (ref 70–99)
Potassium: 3.6 mmol/L (ref 3.5–5.1)
Sodium: 135 mmol/L (ref 135–145)
Total Bilirubin: 0.8 mg/dL (ref 0.3–1.2)
Total Protein: 6.6 g/dL (ref 6.5–8.1)

## 2020-06-05 LAB — PHOSPHORUS: Phosphorus: 3 mg/dL (ref 2.5–4.6)

## 2020-06-05 LAB — MAGNESIUM: Magnesium: 1.9 mg/dL (ref 1.7–2.4)

## 2020-06-05 LAB — URINE CULTURE

## 2020-06-05 LAB — D-DIMER, QUANTITATIVE: D-Dimer, Quant: 0.97 ug/mL-FEU — ABNORMAL HIGH (ref 0.00–0.50)

## 2020-06-05 LAB — C-REACTIVE PROTEIN: CRP: 9 mg/dL — ABNORMAL HIGH (ref <1.0)

## 2020-06-05 LAB — FERRITIN: Ferritin: 105 ng/mL (ref 11–307)

## 2020-06-05 MED ORDER — METHYLPREDNISOLONE SODIUM SUCC 40 MG IJ SOLR
40.0000 mg | Freq: Two times a day (BID) | INTRAMUSCULAR | Status: AC
  Administered 2020-06-05 – 2020-06-07 (×4): 40 mg via INTRAVENOUS
  Filled 2020-06-05 (×4): qty 1

## 2020-06-05 MED ORDER — PREDNISONE 20 MG PO TABS: 40.0000 mg | ORAL_TABLET | Freq: Every day | ORAL | Status: DC

## 2020-06-05 NOTE — ED Notes (Signed)
Report called  Patient to be transferred with all belongings  VSS

## 2020-06-05 NOTE — ED Notes (Signed)
Medications not given on time d/t not being sent from pharmacy.   RN had requested but was not sent

## 2020-06-05 NOTE — Plan of Care (Signed)
  Problem: Education: Goal: Knowledge of risk factors and measures for prevention of condition will improve Outcome: Progressing   Problem: Coping: Goal: Psychosocial and spiritual needs will be supported Outcome: Progressing   Problem: Respiratory: Goal: Will maintain a patent airway Outcome: Progressing Goal: Complications related to the disease process, condition or treatment will be avoided or minimized Outcome: Progressing   

## 2020-06-05 NOTE — TOC Initial Note (Addendum)
Transition of Care [facility]) - Initial/Assessment Note    Patient Details  Name: [name] MRN: [id number] Date of Birth: [birth year]  Transition of [facility]) CM/SW Contact:    Lockie Pares, RN Phone Number: 06/05/2020, 3:25 PM  Clinical Narrative:                 79 year old DNR patient. Admitted for COVID, husband testing positive as well. She is confused having some encephalopathy, She has BCBS and 24/7 care at home.  Has had previous home health as well with [facility] ended 10/30 21. CM will follow for needs.   Expected Discharge Plan: Home w Home Health Services Barriers to Discharge: Continued Medical Work up   Patient Goals and CMS Choice        Expected Discharge Plan and Services Expected Discharge Plan: Home w Home Health Services   Discharge Planning Services: CM Consult Post Acute Care Choice: Durable Medical Equipment Living arrangements for the past 2 months: Single Family Home                                      Prior Living Arrangements/Services Living arrangements for the past 2 months: Single Family Home Lives with:: Spouse Patient language and need for interpreter reviewed:: Yes        Need for Family Participation in Patient Care: Yes (Comment) Care giver support system in place?: Yes (comment)   Criminal Activity/Legal Involvement Pertinent to Current Situation/Hospitalization: No - Comment as needed  Activities of Daily Living Home Assistive Devices/Equipment: Bedside commode/3-in-1,Walker (specify type),Wheelchair ADL Screening (condition at time of admission) Patient's cognitive ability adequate to safely complete daily activities?: Yes Is the patient deaf or have difficulty hearing?: No Does the patient have difficulty seeing, even when wearing glasses/contacts?: No Does the patient have difficulty concentrating, remembering, or making decisions?: No Patient able to express need for assistance with ADLs?: Yes Does the  patient have difficulty dressing or bathing?: Yes Independently performs ADLs?: No Communication: Independent Dressing (OT): Needs assistance Is this a change from baseline?: Pre-admission baseline Grooming: Needs assistance Is this a change from baseline?: Pre-admission baseline Feeding: Independent Bathing: Needs assistance Is this a change from baseline?: Pre-admission baseline Toileting: Needs assistance Is this a change from baseline?: Pre-admission baseline In/Out Bed: Needs assistance Is this a change from baseline?: Pre-admission baseline Walks in Home: Independent with device (comment) (walker, wheel chair "sometimes") Does the patient have difficulty walking or climbing stairs?: Yes Weakness of Legs: Left Weakness of Arms/Hands: None  Permission Sought/Granted                  Emotional Assessment       Orientation: : Fluctuating Orientation (Suspected and/or reported Sundowners) Alcohol / Substance Use: Not Applicable Psych Involvement: No (comment)  Admission diagnosis:  SOB (shortness of breath) [R06.02] Encephalopathy [G93.40] Fever [R50.9] Encephalopathy due to COVID-19 virus [U07.1, G93.49] COVID-19 [U07.1] Patient Active Problem List   Diagnosis Date Noted  . Encephalopathy due to COVID-19 virus 06/04/2020  . 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 UTI (urinary tract infection) 12/06/2018  . Vertebral osteomyelitis (HCC) 12/05/2018  . Spinal stenosis 12/05/2018  . AKI (acute kidney injury) (HCC) 12/05/2018  . Acute metabolic encephalopathy 10/07/2018  . Chronic diastolic (congestive) heart failure (HCC) 10/07/2018  . GERD (gastroesophageal reflux disease) 10/07/2018  . Abnormal glucose tolerance test 07/01/2018  . Colon, diverticulosis 07/01/2018  .  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 Glaucoma 07/01/2018  . Stage 3a chronic kidney disease ([place]) 07/01/2018  . Neuropathy 07/01/2018  . Pure hypercholesterolemia 07/01/2018  . Restless leg  07/01/2018  . Discitis of thoracolumbar region 07/01/2018  . Acute bronchitis due to infection 05/04/2017  . Cough 04/21/2016  . Preoperative cardiovascular examination 04/18/2015  . Parkinsonism ([place]) 05/15/2014  . Chronic anticoagulation -warfarin therapy 02/03/2014  . Atrial fibrillation with RVR ([place]) 01/31/2014  . Gait instability 11/16/2013  . Edema 03/03/2010  . ATRIAL FIBRILLATION 04/23/2008  . Hyperlipidemia 01/31/2008  . Obstructive sleep apnea 01/31/2008  . Unspecified glaucoma 01/30/2008  . Seasonal and perennial allergic rhinitis 01/30/2008  . Osteoarthritis 01/30/2008   PCP:  [name], MD Pharmacy:   [facility] [place] [place] [place] [address] Phone: [phone] Fax: [phone]  [facility] [place], [place] [facility] [address] [place] [address] Phone: [phone] Fax: [phone]  [facility] [phone] [place], [place] - [address] AT [place] [id number] [place] [address] Phone: [phone] Fax: [phone]     Social Determinants of Health ([place]) Interventions    Readmission Risk Interventions Readmission Risk Prevention Plan 10/02/2018  Transportation Screening Complete  PCP or Specialist Appt within 5-7 Days Complete  Home Care Screening Complete  Medication Review (RN CM) Complete  Some recent data might be hidden

## 2020-06-05 NOTE — ED Notes (Signed)
BM present  Pericare performed and new purewick placed, repositioned in bed as best as possible for stretcher.   Sitter at bedside.   VSS

## 2020-06-05 NOTE — ED Notes (Signed)
Sitter at bedside feeding pt

## 2020-06-05 NOTE — ED Notes (Signed)
Patient denies pain and is resting comfortably.  

## 2020-06-05 NOTE — Progress Notes (Signed)
**Note De-Identified [name] Obfuscation** PROGRESS NOTE                                                                                                                                                                                                             Patient Demographics:    [name], is a 79 y.o. female, DOB - 10/29/1940, [id number]  Outpatient Primary MD for the patient is [name], MD   Admit date - 06/03/2020   LOS - 1  Chief Complaint  Patient presents with  . Altered Mental Status       Brief Narrative: Patient is a 79 y.o. female with PMHx of PAF, chronic diastolic heart failure, progressive supranuclear palsy, HTN, HLD, chronic back pain-presenting with acute metabolic encephalopathy in the setting of COVID-19 breakthrough infection.  COVID-19 vaccinated status: Vaccinated-but not boosted  Significant Events: 12/29>> Admit to [facility] for acute metabolic [id number] infection.  Significant studies: 12/28>> CT head: No acute intracranial abnormality 12/29>>Chest x-ray: No pneumonia 12/30>> chest x-ray: Prominent interstitial markings-but no obvious infiltrates  COVID-19 medications: Steroids: 12/29>> Remdesivir: 12/29>>  Antibiotics: None  Microbiology data: 12/29 >>blood culture: No growth 12/29>> urine culture: Multiple species  Procedures: None  Consults: None  DVT prophylaxis: enoxaparin (LOVENOX) injection 40 mg Start: 06/04/20 1600    Subjective:    [name] today is much more awake and alert than yesterday-she was able to tell me her name, she knows she is in the hospital-but does not remember why she was brought to the hospital in the first place.  Her only complaint was that the room was very hot-and wanted the temperature adjusted.   Assessment  & Plan :   Acute metabolic encephalopathy: Secondary to COVID-19 infection-improving-seems much more better than how she first  presented to the hospital.  Continue supportive care-expect some amount of delirium during this hospital stay.  Presumed Covid 19 Viral pneumonia: Although on room air-O2 saturations were in the low 90s this morning-some coarse interstitial markings on chest x-ray this morning-inflammatory markers are significantly elevated-suspect she probably has pneumonia that is just not visible on imaging studies.  Reasonable to continue with steroids/Remdesivir for a few more days given significant improvement in her clinical presentation compared to admission.  Fever: afebrile O2 requirements:  SpO2: 96 %   COVID-19 Labs: Recent Labs    06/04/20 1809 06/05/20 0216  DDIMER 1.10* 0.97*  FERRITIN 92 105  LDH 181  --   CRP 8.8* 9.0*       Component Value Date/Time   BNP 43.7 12/25/2019 2205    Recent Labs  Lab 06/04/20 1809  PROCALCITON <0.10    Lab Results  Component Value Date   SARSCOV2NAA POSITIVE (A) 06/04/2020   SARSCOV2NAA NEGATIVE 12/25/2019   SARSCOV2NAA NEGATIVE 12/17/2018   [place]bon NEGATIVE 12/05/2018    Prone/Incentive Spirometry: encouraged  incentive spirometry use 3-4/hour.  PAF: Currently in sinus rhythm-continue metoprolol/flecainide.  Per prior documentation-not on anticoagulation due to recurrent falls.  Chronic diastolic heart failure: Compensated-follow volume status closely  HLD: Continue statin  Progressive supranuclear palsy: Appears stable-continue Sinemet/rasagiline-resume follow-up with neurology on discharge.  CKD stage IIIa: Creatinine at baseline-follow closely  Peripheral neuropathy: Continue Neurontin/Cymbalta  OSA: Resume CPAP nightly  Obesity: Estimated body mass index is 33.12 kg/m as calculated from the following:   Height as of 03/03/20: 5\' 5"  (1.651 m).   Weight as of 03/03/20: 90.3 kg.    GI prophylaxis: PPI  ABG:    Component Value Date/Time   HCO3 22.6 12/21/2006 0440   TCO2 27 01/31/2014 1802   ACIDBASEDEF 1.0 12/21/2006  0440    Vent Settings: N/A  Condition - Guarded  Family Communication  :  Daughter Joelene Millin [phone] updated over the phone 12/30-her sister-Sandra was also on the line.  Code Status :  DNR  Diet :  Diet Order            Diet regular Room service appropriate? Yes; Fluid consistency: Thin  Diet effective now                  Disposition Plan  :   Status is: Inpatient  Remains inpatient appropriate because:Inpatient level of care appropriate due to severity of illness   Dispo: The patient is from: Home              Anticipated d/c is to: Home              Anticipated d/c date is: 2 days              Patient currently is not medically stable to d/c.   Barriers to discharge: Complete 5 days of IV Remdesivir-await PT/OT eval  Antimicorbials  :    Anti-infectives (From admission, onward)   Start     Dose/Rate Route Frequency Ordered Stop   06/05/20 1000  remdesivir 100 mg in sodium chloride 0.9 % 100 mL IVPB       "Followed by" Linked Group Details   100 mg 200 mL/hr over 30 Minutes Intravenous Daily 06/04/20 1548 06/09/20 0959   06/04/20 1600  remdesivir 200 mg in sodium chloride 0.9% 250 mL IVPB       "Followed by" Linked Group Details   200 mg 580 mL/hr over 30 Minutes Intravenous Once 06/04/20 1548 06/04/20 1914      Inpatient Medications  Scheduled Meds: . vitamin C  500 mg Oral Daily  . carbidopa-levodopa  2 tablet Oral TID  . docusate sodium  100 mg Oral BID  . DULoxetine  60 mg Oral BID WC  . enoxaparin (LOVENOX) injection  40 mg Subcutaneous Q24H  . flecainide  50 mg Oral BID  . gabapentin  300 mg Oral q12n4p  . gabapentin  600 mg Oral QHS  . latanoprost  1 drop Both Eyes QHS  . melatonin  10 mg Oral QHS  . methylPREDNISolone (SOLU-MEDROL)  injection  45 mg Intravenous Q12H   Followed by  . [START ON 06/07/2020] predniSONE  50 mg Oral Daily  . metoprolol succinate  12.5 mg Oral Daily  . nystatin  5 mL Oral QID  . pantoprazole  40 mg Oral Daily   . pravastatin  40 mg Oral Q lunch  . rasagiline  1 mg Oral Daily  . sodium chloride flush  3 mL Intravenous Q12H  . sodium chloride flush  3 mL Intravenous Q12H  . zinc sulfate  220 mg Oral Daily   Continuous Infusions: . sodium chloride    . remdesivir 100 mg in NS 100 mL     PRN Meds:.sodium chloride, acetaminophen, albuterol, bisacodyl, chlorpheniramine-HYDROcodone, guaiFENesin-dextromethorphan, hydrALAZINE, ondansetron **OR** ondansetron (ZOFRAN) IV, oxyCODONE, polyethylene glycol, sodium chloride flush, sodium phosphate   Time Spent in minutes  25  See all Orders from today for further details   Jeoffrey Massed M.D on 06/05/2020 at 11:29 AM  To page go to [email or web address] - use universal password  [facility] -  Office  [phone]    Objective:   Vitals:   06/05/20 0645 06/05/20 0830 06/05/20 0915 06/05/20 1033  BP: (!) 152/53 (!) 121/92 (!) 147/44 (!) 150/71  Pulse: (!) 31 90 88 91  Resp: (!) 22 (!) 28 17   Temp:      TempSrc:      SpO2: 94% 95% 96%     Wt Readings from Last 3 Encounters:  03/03/20 90.3 kg  02/14/20 90.3 kg  12/25/19 83.9 kg     Intake/Output Summary (Last 24 hours) at 06/05/2020 1129 Last data filed at 06/05/2020 0800 Gross per 24 hour  Intake 2980.26 ml  Output --  Net 2980.26 ml     Physical Exam Gen Exam:Alert awake-not in any distress HEENT:atraumatic, normocephalic Chest: B/L clear to auscultation anteriorly CVS:S1S2 regular Abdomen:soft non tender, non distended Extremities:no edema Neurology: Non focal Skin: no rash   Data Review:    CBC Recent Labs  Lab 06/03/20 1929 06/05/20 0216  WBC 7.2 4.0  HGB 12.9 12.7  HCT 42.9 40.5  PLT 132* 116*  MCV 89.7 87.9  MCH 27.0 27.5  MCHC 30.1 31.4  RDW 15.6* 15.8*  LYMPHSABS  --  0.6*  MONOABS  --  0.6  EOSABS  --  0.0  BASOSABS  --  0.0    Chemistries  Recent Labs  Lab 06/03/20 1929 06/05/20 0216  NA 134* 135  K 4.0 3.6  CL 95* 102  CO2 26 22   GLUCOSE 99 98  BUN 16 16  CREATININE 1.08* 0.84  CALCIUM 9.2 8.8*  MG  --  1.9  AST 23 23  ALT 8 6  ALKPHOS 119 96  BILITOT 0.5 0.8   ------------------------------------------------------------------------------------------------------------------ No results for input(s): CHOL, HDL, LDLCALC, TRIG, CHOLHDL, LDLDIRECT in the last 72 hours.  Lab Results  Component Value Date   HGBA1C 6.0 (H) 02/27/2013   ------------------------------------------------------------------------------------------------------------------ No results for input(s): TSH, T4TOTAL, T3FREE, THYROIDAB in the last 72 hours.  Invalid input(s): FREET3 ------------------------------------------------------------------------------------------------------------------ Recent Labs    06/04/20 1809 06/05/20 0216  FERRITIN 92 105    Coagulation profile No results for input(s): INR, PROTIME in the last 168 hours.  Recent Labs    06/04/20 1809 06/05/20 0216  DDIMER 1.10* 0.97*    Cardiac Enzymes No results for input(s): CKMB, TROPONINI, MYOGLOBIN in the last 168 hours.  Invalid input(s): CK ------------------------------------------------------------------------------------------------------------------    Component Value Date/Time   BNP 43.7 12/25/2019 2205  Micro Results Recent Results (from the past 240 hour(s))  Urine culture     Status: Abnormal   Collection Time: 06/04/20  3:00 AM   Specimen: Urine, Random  Result Value Ref Range Status   Specimen Description URINE, RANDOM  Final   Special Requests   Final    NONE Performed at [facility], [address], [place], [place] [address]    Culture MULTIPLE SPECIES PRESENT, SUGGEST RECOLLECTION (A)  Final   Report Status 06/05/2020 FINAL  Final  Resp Panel by RT-PCR (Flu A&B, Covid) Nasopharyngeal Swab     Status: Abnormal   Collection Time: 06/04/20 11:00 AM   Specimen: Nasopharyngeal Swab; Nasopharyngeal(NP) swabs in vial transport  medium  Result Value Ref Range Status   SARS Coronavirus 2 by RT PCR POSITIVE (A) NEGATIVE Final    Comment: emailed L. Berdik RN 12:55 06/04/20 ([email or web address]) (NOTE) SARS-CoV-2 target nucleic acids are DETECTED.  The SARS-CoV-2 RNA is generally detectable in upper respiratory specimens during the acute phase of infection. Positive results are indicative of the presence of the identified virus, but do not rule out bacterial infection or co-infection with other pathogens not detected by the test. Clinical correlation with patient history and other diagnostic information is necessary to determine patient infection status. The expected result is Negative.  Fact Sheet for Patients: EntrepreneurPulse.com.au  Fact Sheet for Healthcare Providers: IncredibleEmployment.be  This test is not yet approved or cleared by the [place] FDA and  has been authorized for detection and/or diagnosis of SARS-CoV-2 by FDA under an Emergency Use Authorization (EUA).  This EUA will remain in effect (meaning this test can be used) for the duration of  the COVID- 19 declaration under Section 564(b)(1) of the Act, 21 U.S.C. section 360bbb-3(b)(1), unless the authorization is terminated or revoked sooner.     Influenza A by PCR NEGATIVE NEGATIVE Final   Influenza B by PCR NEGATIVE NEGATIVE Final    Comment: (NOTE) The Xpert Xpress SARS-CoV-2/FLU/RSV plus assay is intended as an aid in the diagnosis of influenza from Nasopharyngeal swab specimens and should not be used as a sole basis for treatment. Nasal washings and aspirates are unacceptable for Xpert Xpress SARS-CoV-2/FLU/RSV testing.  Fact Sheet for Patients: EntrepreneurPulse.com.au  Fact Sheet for Healthcare Providers: IncredibleEmployment.be  This test is not yet approved or cleared by the [place] FDA and has been authorized for detection and/or diagnosis of  SARS-CoV-2 by FDA under an Emergency Use Authorization (EUA). This EUA will remain in effect (meaning this test can be used) for the duration of the COVID-19 declaration under Section 564(b)(1) of the Act, 21 U.S.C. section 360bbb-3(b)(1), unless the authorization is terminated or revoked.  Performed at [facility], [place] [address], [place], [place] [address]   Culture, blood (routine x 2)     Status: None (Preliminary result)   Collection Time: 06/04/20  1:13 PM   Specimen: BLOOD LEFT ARM  Result Value Ref Range Status   Specimen Description BLOOD LEFT ARM  Final   Special Requests   Final    BOTTLES DRAWN AEROBIC AND ANAEROBIC Blood Culture adequate volume   Culture   Final    NO GROWTH < 24 HOURS Performed at [facility], [place] [address] [place], [place] [address]    Report Status PENDING  Incomplete    Radiology Reports DG Chest 1 View  Result Date: 06/04/2020 CLINICAL DATA:  Slurred speech and difficulty with ambulation EXAM: CHEST  1 VIEW  COMPARISON:  December 04, 2018 FINDINGS: Lungs are clear. Heart is borderline enlarged with pulmonary vascularity normal. No adenopathy. There is aortic atherosclerosis. There is postoperative change in the mid to lower thoracic spine and visualized upper lumbar spine regions. IMPRESSION: No edema or airspace opacity.  Heart borderline enlarged. Aortic Atherosclerosis (ICD10-I70.0). Electronically Signed   By: Lowella Grip III M.D.   On: 06/04/2020 09:26   CT Head Wo Contrast  Result Date: 06/03/2020 CLINICAL DATA:  Mental status change, unknown cause Confusion and slurred speech. EXAM: CT HEAD WITHOUT CONTRAST TECHNIQUE: Contiguous axial images were obtained from the base of the skull through the vertex without intravenous contrast. COMPARISON:  Head CT 12/25/2019 FINDINGS: Brain: Stable degree of atrophy and chronic small vessel ischemia no intracranial hemorrhage, mass effect, or midline shift. No hydrocephalus. The basilar  cisterns are patent. Remote lacunar infarct versus prominent perivascular space in the left basal ganglia, unchanged. No evidence of territorial infarct or acute ischemia. No extra-axial or intracranial fluid collection. Vascular: Atherosclerosis of skullbase vasculature without hyperdense vessel or abnormal calcification. Skull: No fracture or focal lesion. Sinuses/Orbits: Paranasal sinuses and mastoid air cells are clear. The visualized orbits are unremarkable. Bilateral cataract resection. Other: None. IMPRESSION: 1. No acute intracranial abnormality. 2. Stable atrophy and chronic small vessel ischemia. Electronically Signed   By: Keith Rake M.D.   On: 06/03/2020 20:15   DG Chest Port 1V same Day  Result Date: 06/05/2020 CLINICAL DATA:  Shortness of breath, COVID. EXAM: PORTABLE CHEST 1 VIEW COMPARISON:  06/04/2020 and prior. FINDINGS: No pneumothorax, focal consolidation or pleural effusion. Stable cardiomediastinal silhouette. No acute osseous abnormality. Posterior spinal fusion hardware. IMPRESSION: No focal airspace disease. Electronically Signed   By: Primitivo Gauze M.D.   On: 06/05/2020 07:56

## 2020-06-05 NOTE — ED Notes (Signed)
Pt remains confused. Pt alert to self, situation but disoriented to time and place. When asked where she is pt states she is in [place] and for month she states July.

## 2020-06-06 DIAGNOSIS — I4891 Unspecified atrial fibrillation: Secondary | ICD-10-CM | POA: Diagnosis not present

## 2020-06-06 DIAGNOSIS — I5032 Chronic diastolic (congestive) heart failure: Secondary | ICD-10-CM | POA: Diagnosis not present

## 2020-06-06 DIAGNOSIS — G9349 Other encephalopathy: Secondary | ICD-10-CM | POA: Diagnosis not present

## 2020-06-06 DIAGNOSIS — U071 COVID-19: Secondary | ICD-10-CM | POA: Diagnosis not present

## 2020-06-06 LAB — CBC WITH DIFFERENTIAL/PLATELET
Abs Immature Granulocytes: 0.01 10*3/uL (ref 0.00–0.07)
Basophils Absolute: 0 10*3/uL (ref 0.0–0.1)
Basophils Relative: 0 %
Eosinophils Absolute: 0 10*3/uL (ref 0.0–0.5)
Eosinophils Relative: 0 %
HCT: 39.4 % (ref 36.0–46.0)
Hemoglobin: 13.2 g/dL (ref 12.0–15.0)
Immature Granulocytes: 0 %
Lymphocytes Relative: 15 %
Lymphs Abs: 0.4 10*3/uL — ABNORMAL LOW (ref 0.7–4.0)
MCH: 28 pg (ref 26.0–34.0)
MCHC: 33.5 g/dL (ref 30.0–36.0)
MCV: 83.5 fL (ref 80.0–100.0)
Monocytes Absolute: 0.2 10*3/uL (ref 0.1–1.0)
Monocytes Relative: 7 %
Neutro Abs: 1.9 10*3/uL (ref 1.7–7.7)
Neutrophils Relative %: 78 %
Platelets: 145 10*3/uL — ABNORMAL LOW (ref 150–400)
RBC: 4.72 MIL/uL (ref 3.87–5.11)
RDW: 15.8 % — ABNORMAL HIGH (ref 11.5–15.5)
WBC: 2.5 10*3/uL — ABNORMAL LOW (ref 4.0–10.5)
nRBC: 0 % (ref 0.0–0.2)

## 2020-06-06 LAB — COMPREHENSIVE METABOLIC PANEL
ALT: <5 U/L (ref 0–44)
AST: 20 U/L (ref 15–41)
Albumin: 3.5 g/dL (ref 3.5–5.0)
Alkaline Phosphatase: 94 U/L (ref 38–126)
Anion gap: 12 (ref 5–15)
BUN: 16 mg/dL (ref 8–23)
CO2: 21 mmol/L — ABNORMAL LOW (ref 22–32)
Calcium: 9 mg/dL (ref 8.9–10.3)
Chloride: 105 mmol/L (ref 98–111)
Creatinine, Ser: 0.83 mg/dL (ref 0.44–1.00)
GFR, Estimated: >60 mL/min (ref >60)
Glucose, Bld: 135 mg/dL — ABNORMAL HIGH (ref 70–99)
Potassium: 4.5 mmol/L (ref 3.5–5.1)
Sodium: 138 mmol/L (ref 135–145)
Total Bilirubin: 0.7 mg/dL (ref 0.3–1.2)
Total Protein: 6.3 g/dL — ABNORMAL LOW (ref 6.5–8.1)

## 2020-06-06 LAB — FERRITIN: Ferritin: 128 ng/mL (ref 11–307)

## 2020-06-06 LAB — C-REACTIVE PROTEIN: CRP: 7.1 mg/dL — ABNORMAL HIGH (ref <1.0)

## 2020-06-06 LAB — MAGNESIUM: Magnesium: 2 mg/dL (ref 1.7–2.4)

## 2020-06-06 LAB — D-DIMER, QUANTITATIVE: D-Dimer, Quant: 0.85 ug/mL-FEU — ABNORMAL HIGH (ref 0.00–0.50)

## 2020-06-06 LAB — PHOSPHORUS: Phosphorus: 3.5 mg/dL (ref 2.5–4.6)

## 2020-06-06 NOTE — Plan of Care (Signed)
  Problem: Education: Goal: Knowledge of risk factors and measures for prevention of condition will improve Outcome: Progressing   Problem: Coping: Goal: Psychosocial and spiritual needs will be supported Outcome: Progressing   Problem: Respiratory: Goal: Will maintain a patent airway Outcome: Progressing Goal: Complications related to the disease process, condition or treatment will be avoided or minimized Outcome: Progressing   Problem: Education: Goal: Knowledge of General Education information will improve Description: Including pain rating scale, medication(s)/side effects and non-pharmacologic comfort measures Outcome: Progressing   Problem: Health Behavior/Discharge Planning: Goal: Ability to manage health-related needs will improve Outcome: Progressing   Problem: Clinical Measurements: Goal: Ability to maintain clinical measurements within normal limits will improve Outcome: Progressing Goal: Will remain free from infection Outcome: Progressing Goal: Diagnostic test results will improve Outcome: Progressing Goal: Respiratory complications will improve Outcome: Progressing Goal: Cardiovascular complication will be avoided Outcome: Progressing   Problem: Activity: Goal: Risk for activity intolerance will decrease Outcome: Progressing   Problem: Coping: Goal: Level of anxiety will decrease Outcome: Progressing   Problem: Elimination: Goal: Will not experience complications related to bowel motility Outcome: Progressing Goal: Will not experience complications related to urinary retention Outcome: Progressing   Problem: Safety: Goal: Ability to remain free from injury will improve Outcome: Progressing   Problem: Skin Integrity: Goal: Risk for impaired skin integrity will decrease Outcome: Progressing   

## 2020-06-06 NOTE — Progress Notes (Signed)
PROGRESS NOTE                                                                                                                                                                                                             Patient Demographics:    [name], is a 79 y.o. female, DOB - Mar 25, 1941, TGY:[id number]  Outpatient Primary MD for the patient is Blair Heys, MD   Admit date - 06/03/2020   LOS - 2  Chief Complaint  Patient presents with  . Altered Mental Status       Brief Narrative: Patient is a 79 y.o. female with PMHx of PAF, chronic diastolic heart failure, progressive supranuclear palsy, HTN, HLD, chronic back pain-presenting with acute metabolic encephalopathy in the setting of COVID-19 breakthrough infection.  COVID-19 vaccinated status: Vaccinated-but not boosted  Significant Events: 12/29>> Admit to [facility] for acute metabolic encephalopathy-COVID-19 infection.  Significant studies: 12/28>> CT head: No acute intracranial abnormality 12/29>>Chest x-ray: No pneumonia 12/30>> chest x-ray: Prominent interstitial markings-but no obvious infiltrates  COVID-19 medications: Steroids: 12/29>> Remdesivir: 12/29>>  Antibiotics: None  Microbiology data: 12/29 >>blood culture: No growth 12/29>> urine culture: Multiple species  Procedures: None  Consults: None  DVT prophylaxis: enoxaparin (LOVENOX) injection 40 mg Start: 06/04/20 1600    Subjective:   She is significantly improved-much more calmer/quiet compared to yesterday-she is awake and alert this morning.   Assessment  & Plan :   Acute metabolic encephalopathy: Secondary to COVID-19 infection-significantly improved-continue supportive care.  Suspect have some amount of chronic cognitive dysfunction at baseline-and probably will have delirium during this hospital stay.  Maintain delirium precautions.  Presumed Covid 19 Viral pneumonia:  Much improved-on room air-suspect she has small pneumonia not visible on imaging studies-inflammatory markers downtrending-continue steroid/Remdesivir.  Fever: afebrile O2 requirements:  SpO2: 92 %   COVID-19 Labs: Recent Labs    06/04/20 1809 06/05/20 0216 06/06/20 0123  DDIMER 1.10* 0.97* 0.85*  FERRITIN 92 105 128  LDH 181  --   --   CRP 8.8* 9.0* 7.1*       Component Value Date/Time   BNP 43.7 12/25/2019 2205    Recent Labs  Lab 06/04/20 1809  PROCALCITON <0.10    Lab Results  Component Value Date   SARSCOV2NAA POSITIVE (A) 06/04/2020   SARSCOV2NAA NEGATIVE 12/25/2019   SARSCOV2NAA NEGATIVE 12/17/2018  [place] NEGATIVE 12/05/2018    Prone/Incentive Spirometry: encouraged  incentive spirometry use 3-4/hour.  PAF: Currently in sinus rhythm-continue metoprolol/flecainide.  Per prior documentation-not on anticoagulation due to recurrent falls.  Chronic diastolic heart failure: Compensated-follow volume status closely  HLD: Continue statin  Progressive supranuclear palsy: Appears stable-continue Sinemet/rasagiline-resume follow-up with neurology on discharge.  CKD stage IIIa: Creatinine at baseline-follow closely  Peripheral neuropathy: Continue Neurontin/Cymbalta  OSA: Resume CPAP nightly  Obesity: Estimated body mass index is 30.73 kg/m as calculated from the following:   Height as of this encounter: 5\' 7"  (1.702 m).   Weight as of this encounter: 89 kg.    GI prophylaxis: PPI  ABG:    Component Value Date/Time   HCO3 22.6 12/21/2006 0440   TCO2 27 01/31/2014 1802   ACIDBASEDEF 1.0 12/21/2006 0440    Vent Settings: N/A  Condition - Guarded  Family Communication  :  Daughter Joelene Millin [phone] updated over the phone 12/31  Code Status :  DNR  Diet :  Diet Order            Diet regular Room service appropriate? Yes; Fluid consistency: Thin  Diet effective now                  Disposition Plan  :   Status is:  Inpatient  Remains inpatient appropriate because:Inpatient level of care appropriate due to severity of illness   Dispo: The patient is from: Home              Anticipated d/c is to: Home              Anticipated d/c date is: 1 days              Patient currently is not medically stable to d/c.   Barriers to discharge: Complete 5 days of IV Remdesivir  Antimicorbials  :    Anti-infectives (From admission, onward)   Start     Dose/Rate Route Frequency Ordered Stop   06/05/20 1000  remdesivir 100 mg in sodium chloride 0.9 % 100 mL IVPB       "Followed by" Linked Group Details   100 mg 200 mL/hr over 30 Minutes Intravenous Daily 06/04/20 1548 06/09/20 0959   06/04/20 1600  remdesivir 200 mg in sodium chloride 0.9% 250 mL IVPB       "Followed by" Linked Group Details   200 mg 580 mL/hr over 30 Minutes Intravenous Once 06/04/20 1548 06/04/20 1914      Inpatient Medications  Scheduled Meds: . vitamin C  500 mg Oral Daily  . carbidopa-levodopa  2 tablet Oral TID  . docusate sodium  100 mg Oral BID  . DULoxetine  60 mg Oral BID WC  . enoxaparin (LOVENOX) injection  40 mg Subcutaneous Q24H  . flecainide  50 mg Oral BID  . gabapentin  300 mg Oral q12n4p  . gabapentin  600 mg Oral QHS  . latanoprost  1 drop Both Eyes QHS  . melatonin  10 mg Oral QHS  . methylPREDNISolone (SOLU-MEDROL) injection  40 mg Intravenous Q12H   Followed by  . [START ON 06/07/2020] predniSONE  40 mg Oral Daily  . metoprolol succinate  12.5 mg Oral Daily  . nystatin  5 mL Oral QID  . pantoprazole  40 mg Oral Daily  . pravastatin  40 mg Oral Q lunch  . rasagiline  1 mg Oral Daily  . sodium chloride flush  3 mL Intravenous Q12H  . sodium chloride  flush  3 mL Intravenous Q12H  . zinc sulfate  220 mg Oral Daily   Continuous Infusions: . sodium chloride    . remdesivir 100 mg in NS 100 mL 100 mg (06/06/20 0930)   PRN Meds:.sodium chloride, acetaminophen, albuterol, bisacodyl,  chlorpheniramine-HYDROcodone, guaiFENesin-dextromethorphan, hydrALAZINE, ondansetron **OR** ondansetron (ZOFRAN) IV, oxyCODONE, polyethylene glycol, sodium chloride flush, sodium phosphate   Time Spent in minutes  25  See all Orders from today for further details   Oren Binet M.D on 06/06/2020 at 2:36 PM  To page go to [email or web address] - use universal password  [facility] -  Office  [phone]    Objective:   Vitals:   06/05/20 2039 06/06/20 0022 06/06/20 0452 06/06/20 0744  BP: (!) 170/88 140/65 (!) 152/69 (!) 170/71  Pulse: 81 71 66 66  Resp: 17 20 15 14   Temp: 98.1 F (36.7 C) 98 F (36.7 C) 98.2 F (36.8 C) 97.6 F (36.4 C)  TempSrc: Oral Oral Oral Oral  SpO2: 94% 95% 96% 92%  Weight:      Height:        Wt Readings from Last 3 Encounters:  06/05/20 89 kg  03/03/20 90.3 kg  02/14/20 90.3 kg     Intake/Output Summary (Last 24 hours) at 06/06/2020 1436 Last data filed at 06/06/2020 0954 Gross per 24 hour  Intake 580 ml  Output 400 ml  Net 180 ml     Physical Exam Gen Exam:Alert awake-not in any distress HEENT:atraumatic, normocephalic Chest: B/L clear to auscultation anteriorly CVS:S1S2 regular Abdomen:soft non tender, non distended Extremities:no edema Neurology: Non focal Skin: no rash   Data Review:    CBC Recent Labs  Lab 06/03/20 1929 06/05/20 0216 06/06/20 0409  WBC 7.2 4.0 2.5*  HGB 12.9 12.7 13.2  HCT 42.9 40.5 39.4  PLT 132* 116* 145*  MCV 89.7 87.9 83.5  MCH 27.0 27.5 28.0  MCHC 30.1 31.4 33.5  RDW 15.6* 15.8* 15.8*  LYMPHSABS  --  0.6* 0.4*  MONOABS  --  0.6 0.2  EOSABS  --  0.0 0.0  BASOSABS  --  0.0 0.0    Chemistries  Recent Labs  Lab 06/03/20 1929 06/05/20 0216 06/06/20 0123  NA 134* 135 138  K 4.0 3.6 4.5  CL 95* 102 105  CO2 26 22 21*  GLUCOSE 99 98 135*  BUN 16 16 16   CREATININE 1.08* 0.84 0.83  CALCIUM 9.2 8.8* 9.0  MG  --  1.9 2.0  AST 23 23 20   ALT 8 6 <5  ALKPHOS 119 96 94  BILITOT  0.5 0.8 0.7   ------------------------------------------------------------------------------------------------------------------ No results for input(s): CHOL, HDL, LDLCALC, TRIG, CHOLHDL, LDLDIRECT in the last 72 hours.  Lab Results  Component Value Date   HGBA1C 6.0 (H) 02/27/2013   ------------------------------------------------------------------------------------------------------------------ No results for input(s): TSH, T4TOTAL, T3FREE, THYROIDAB in the last 72 hours.  Invalid input(s): FREET3 ------------------------------------------------------------------------------------------------------------------ Recent Labs    06/05/20 0216 06/06/20 0123  FERRITIN 105 128    Coagulation profile No results for input(s): INR, PROTIME in the last 168 hours.  Recent Labs    06/05/20 0216 06/06/20 0123  DDIMER 0.97* 0.85*    Cardiac Enzymes No results for input(s): CKMB, TROPONINI, MYOGLOBIN in the last 168 hours.  Invalid input(s): CK ------------------------------------------------------------------------------------------------------------------    Component Value Date/Time   BNP 43.7 12/25/2019 2205    Micro Results Recent Results (from the past 240 hour(s))  Urine culture     Status: Abnormal   Collection Time:  06/04/20  3:00 AM   Specimen: Urine, Random  Result Value Ref Range Status   Specimen Description URINE, RANDOM  Final   Special Requests   Final    NONE Performed at [facility], [address] [place], [place] [address]    Culture MULTIPLE SPECIES PRESENT, SUGGEST RECOLLECTION (A)  Final   Report Status 06/05/2020 FINAL  Final  Resp Panel by RT-PCR (Flu A&B, Covid) Nasopharyngeal Swab     Status: Abnormal   Collection Time: 06/04/20 11:00 AM   Specimen: Nasopharyngeal Swab; Nasopharyngeal(NP) swabs in vial transport medium  Result Value Ref Range Status   SARS Coronavirus 2 by RT PCR POSITIVE (A) NEGATIVE Final    Comment: emailed L. Berdik  RN 12:55 06/04/20 ([email or web address]) (NOTE) SARS-CoV-2 target nucleic acids are DETECTED.  The SARS-CoV-2 RNA is generally detectable in upper respiratory specimens during the acute phase of infection. Positive results are indicative of the presence of the identified virus, but do not rule out bacterial infection or co-infection with other pathogens not detected by the test. Clinical correlation with patient history and other diagnostic information is necessary to determine patient infection status. The expected result is Negative.  Fact Sheet for Patients: EntrepreneurPulse.com.au  Fact Sheet for Healthcare Providers: IncredibleEmployment.be  This test is not yet approved or cleared by the [place] FDA and  has been authorized for detection and/or diagnosis of SARS-CoV-2 by FDA under an Emergency Use Authorization (EUA).  This EUA will remain in effect (meaning this test can be used) for the duration of  the COVID- 19 declaration under Section 564(b)(1) of the Act, 21 U.S.C. section 360bbb-3(b)(1), unless the authorization is terminated or revoked sooner.     Influenza A by PCR NEGATIVE NEGATIVE Final   Influenza B by PCR NEGATIVE NEGATIVE Final    Comment: (NOTE) The Xpert Xpress SARS-CoV-2/FLU/RSV plus assay is intended as an aid in the diagnosis of influenza from Nasopharyngeal swab specimens and should not be used as a sole basis for treatment. Nasal washings and aspirates are unacceptable for Xpert Xpress SARS-CoV-2/FLU/RSV testing.  Fact Sheet for Patients: EntrepreneurPulse.com.au  Fact Sheet for Healthcare Providers: IncredibleEmployment.be  This test is not yet approved or cleared by the [place] FDA and has been authorized for detection and/or diagnosis of SARS-CoV-2 by FDA under an Emergency Use Authorization (EUA). This EUA will remain in effect (meaning this test can be used) for the  duration of the COVID-19 declaration under Section 564(b)(1) of the Act, 21 U.S.C. section 360bbb-3(b)(1), unless the authorization is terminated or revoked.  Performed at [facility], [place] [address] [place], [place] [address]   Culture, blood (routine x 2)     Status: None (Preliminary result)   Collection Time: 06/04/20  1:13 PM   Specimen: BLOOD LEFT ARM  Result Value Ref Range Status   Specimen Description BLOOD LEFT ARM  Final   Special Requests   Final    BOTTLES DRAWN AEROBIC AND ANAEROBIC Blood Culture adequate volume   Culture   Final    NO GROWTH 2 DAYS Performed at [facility], [place] [address] [place], [place] [address]    Report Status PENDING  Incomplete    Radiology Reports DG Chest 1 View  Result Date: 06/04/2020 CLINICAL DATA:  Slurred speech and difficulty with ambulation EXAM: CHEST  1 VIEW COMPARISON:  December 04, 2018 FINDINGS: Lungs are clear. Heart is borderline enlarged with pulmonary vascularity normal. No adenopathy. There is aortic atherosclerosis.  There is postoperative change in the mid to lower thoracic spine and visualized upper lumbar spine regions. IMPRESSION: No edema or airspace opacity.  Heart borderline enlarged. Aortic Atherosclerosis (ICD10-I70.0). Electronically Signed   By: Lowella Grip III M.D.   On: 06/04/2020 09:26   CT Head Wo Contrast  Result Date: 06/03/2020 CLINICAL DATA:  Mental status change, unknown cause Confusion and slurred speech. EXAM: CT HEAD WITHOUT CONTRAST TECHNIQUE: Contiguous axial images were obtained from the base of the skull through the vertex without intravenous contrast. COMPARISON:  Head CT 12/25/2019 FINDINGS: Brain: Stable degree of atrophy and chronic small vessel ischemia no intracranial hemorrhage, mass effect, or midline shift. No hydrocephalus. The basilar cisterns are patent. Remote lacunar infarct versus prominent perivascular space in the left basal ganglia, unchanged. No evidence of  territorial infarct or acute ischemia. No extra-axial or intracranial fluid collection. Vascular: Atherosclerosis of skullbase vasculature without hyperdense vessel or abnormal calcification. Skull: No fracture or focal lesion. Sinuses/Orbits: Paranasal sinuses and mastoid air cells are clear. The visualized orbits are unremarkable. Bilateral cataract resection. Other: None. IMPRESSION: 1. No acute intracranial abnormality. 2. Stable atrophy and chronic small vessel ischemia. Electronically Signed   By: Keith Rake M.D.   On: 06/03/2020 20:15   DG Chest Port 1V same Day  Result Date: 06/05/2020 CLINICAL DATA:  Shortness of breath, COVID. EXAM: PORTABLE CHEST 1 VIEW COMPARISON:  06/04/2020 and prior. FINDINGS: No pneumothorax, focal consolidation or pleural effusion. Stable cardiomediastinal silhouette. No acute osseous abnormality. Posterior spinal fusion hardware. IMPRESSION: No focal airspace disease. Electronically Signed   By: Primitivo Gauze M.D.   On: 06/05/2020 07:56

## 2020-06-06 NOTE — Progress Notes (Signed)
RT placed pt on cpap for the night. 

## 2020-06-06 NOTE — Progress Notes (Signed)
Pt woke up confused and disoriented asking for help to get CPAP off. Cpap was removed at 0333.

## 2020-06-06 NOTE — Progress Notes (Signed)
Patient refused CPAP tonight. Patient is confused. Patient stated she did wear it last night but took it off. She doesn't want it tonight.

## 2020-06-06 NOTE — TOC Progression Note (Addendum)
Transition of Care [facility]) - Progression Note    Patient Details  Name: [name] MRN: [id number] Date of Birth: [birth year]  [facility] CM/SW Contact  Lockie Pares, RN Phone Number: 06/06/2020, 3:23 PM  Clinical Narrative:     Talked on the phone to patient daughter Barb Merino, she states they have had HH before with [facility]. Orders will be placed for Pt and OT to assist. Touched base with agency for acceptance [facility] accepted for PT OT.  No other equipment needed per daughter  Expected Discharge Plan: Home w Home Health Services Barriers to Discharge: Continued Medical Work up  Expected Discharge Plan and Services Expected Discharge Plan: Home w Home Health Services   Discharge Planning Services: CM Consult Post Acute Care Choice: Durable Medical Equipment Living arrangements for the past 2 months: Single Family Home                                       Social Determinants of Health (SDOH) Interventions    Readmission Risk Interventions Readmission Risk Prevention Plan 10/02/2018  Transportation Screening Complete  PCP or Specialist Appt within 5-7 Days Complete  Home Care Screening Complete  Medication Review (RN CM) Complete  Some recent data might be hidden

## 2020-06-07 DIAGNOSIS — G9349 Other encephalopathy: Secondary | ICD-10-CM | POA: Diagnosis not present

## 2020-06-07 DIAGNOSIS — U071 COVID-19: Secondary | ICD-10-CM | POA: Diagnosis not present

## 2020-06-07 DIAGNOSIS — I5032 Chronic diastolic (congestive) heart failure: Secondary | ICD-10-CM | POA: Diagnosis not present

## 2020-06-07 DIAGNOSIS — I4891 Unspecified atrial fibrillation: Secondary | ICD-10-CM | POA: Diagnosis not present

## 2020-06-07 LAB — COMPREHENSIVE METABOLIC PANEL
ALT: 10 U/L (ref 0–44)
AST: 20 U/L (ref 15–41)
Albumin: 3.7 g/dL (ref 3.5–5.0)
Alkaline Phosphatase: 94 U/L (ref 38–126)
Anion gap: 10 (ref 5–15)
BUN: 20 mg/dL (ref 8–23)
CO2: 25 mmol/L (ref 22–32)
Calcium: 9.1 mg/dL (ref 8.9–10.3)
Chloride: 103 mmol/L (ref 98–111)
Creatinine, Ser: 0.89 mg/dL (ref 0.44–1.00)
GFR, Estimated: >60 mL/min (ref >60)
Glucose, Bld: 127 mg/dL — ABNORMAL HIGH (ref 70–99)
Potassium: 3.6 mmol/L (ref 3.5–5.1)
Sodium: 138 mmol/L (ref 135–145)
Total Bilirubin: 0.3 mg/dL (ref 0.3–1.2)
Total Protein: 6.7 g/dL (ref 6.5–8.1)

## 2020-06-07 LAB — CBC WITH DIFFERENTIAL/PLATELET
Abs Immature Granulocytes: 0.02 10*3/uL (ref 0.00–0.07)
Basophils Absolute: 0 10*3/uL (ref 0.0–0.1)
Basophils Relative: 0 %
Eosinophils Absolute: 0 10*3/uL (ref 0.0–0.5)
Eosinophils Relative: 0 %
HCT: 43.1 % (ref 36.0–46.0)
Hemoglobin: 13.6 g/dL (ref 12.0–15.0)
Immature Granulocytes: 0 %
Lymphocytes Relative: 12 %
Lymphs Abs: 0.6 10*3/uL — ABNORMAL LOW (ref 0.7–4.0)
MCH: 26.8 pg (ref 26.0–34.0)
MCHC: 31.6 g/dL (ref 30.0–36.0)
MCV: 84.8 fL (ref 80.0–100.0)
Monocytes Absolute: 0.6 10*3/uL (ref 0.1–1.0)
Monocytes Relative: 13 %
Neutro Abs: 3.6 10*3/uL (ref 1.7–7.7)
Neutrophils Relative %: 75 %
Platelets: 184 10*3/uL (ref 150–400)
RBC: 5.08 MIL/uL (ref 3.87–5.11)
RDW: 15.5 % (ref 11.5–15.5)
WBC: 4.8 10*3/uL (ref 4.0–10.5)
nRBC: 0 % (ref 0.0–0.2)

## 2020-06-07 LAB — FERRITIN: Ferritin: 135 ng/mL (ref 11–307)

## 2020-06-07 LAB — D-DIMER, QUANTITATIVE: D-Dimer, Quant: 0.64 ug/mL-FEU — ABNORMAL HIGH (ref 0.00–0.50)

## 2020-06-07 LAB — C-REACTIVE PROTEIN: CRP: 3.1 mg/dL — ABNORMAL HIGH (ref <1.0)

## 2020-06-07 MED ORDER — BENZONATATE 100 MG PO CAPS: 100.0000 mg | ORAL_CAPSULE | Freq: Four times a day (QID) | ORAL | 0 refills | Status: DC | PRN

## 2020-06-07 MED ORDER — PREDNISONE 10 MG PO TABS: ORAL_TABLET | ORAL | 0 refills | Status: DC

## 2020-06-07 NOTE — Progress Notes (Signed)
Nsg Discharge Note  Admit Date:  06/03/2020 Discharge date: 06/07/2020   [name] to be D/C'd Home per MD order.  AVS completed.     Discharge Medication: Allergies as of 06/07/2020      Reactions   Lyrica [pregabalin] Other (See Comments)   Felt loopy   Zocor [simvastatin] Other (See Comments)   Myalgias   Relafen [nabumetone] Rash      Medication List    TAKE these medications   acetaminophen 650 MG CR tablet Commonly known as: Tylenol 8 Hour Take 1 tablet (650 mg total) by mouth every 8 (eight) hours as needed for pain.   AZO CRANBERRY URINARY TRACT PO Take 2 tablets by mouth as needed (urinary).   benzonatate 100 MG capsule Commonly known as: Tessalon Perles Take 1 capsule (100 mg total) by mouth every 6 (six) hours as needed for cough.   BIOFREEZE EX Apply 1 application topically daily as needed (pain).   carbidopa-levodopa 25-100 MG tablet Commonly known as: SINEMET IR Take 2 tablets by mouth 3 (three) times daily.   Cholecalciferol 50 MCG (2000 UT) Caps Take 4,000 Units by mouth daily.   DULoxetine 60 MG capsule Commonly known as: CYMBALTA Take 60 mg by mouth 2 (two) times daily with a meal.   flecainide 50 MG tablet Commonly known as: TAMBOCOR TAKE 1 TABLET(50 MG) BY MOUTH TWICE DAILY What changed: See the new instructions.   furosemide 20 MG tablet Commonly known as: LASIX TAKE 1 TABLET(20 MG) BY MOUTH DAILY What changed: See the new instructions.   gabapentin 300 MG capsule Commonly known as: NEURONTIN Take 300-600 mg by mouth See admin instructions. Take one capsule (300 mg) by mouth daily with lunch and supper, and take two capsules (600 mg) at bedtime.   Gemtesa 75 MG Tabs Generic drug: Vibegron Take 75 mg by mouth daily. For bladder control issues.   Melatonin 10 MG Tabs Take 1 tablet by mouth at bedtime.   metoprolol succinate 25 MG 24 hr tablet Commonly known as: TOPROL-XL Take 12.5 mg by mouth daily.   pantoprazole 40 MG  tablet Commonly known as: PROTONIX Take 40 mg by mouth daily.   polyethylene glycol 17 g packet Commonly known as: MIRALAX / GLYCOLAX Take 17 g by mouth daily. What changed:   when to take this  reasons to take this   potassium chloride SA 20 MEQ tablet Commonly known as: KLOR-CON Take 1 tablet (20 mEq total) by mouth daily.   pravastatin 40 MG tablet Commonly known as: PRAVACHOL Take 40 mg by mouth daily with lunch.   predniSONE 10 MG tablet Commonly known as: DELTASONE Take 40 mg daily for 1 day, 30 mg daily for 1 day, 20 mg daily for 1 days,10 mg daily for 1 day, then stop   rasagiline 1 MG Tabs tablet Commonly known as: AZILECT Take 1 mg by mouth daily.   Travatan Z 0.004 % Soln ophthalmic solution Generic drug: Travoprost (BAK Free) Place 1 drop into both eyes at bedtime.       Discharge Assessment: Vitals:   06/07/20 0426 06/07/20 0805  BP: (!) 152/69 (!) 147/73  Pulse: 70   Resp: 17   Temp: 98.2 F (36.8 C)   SpO2: 99%    Skin clean, dry and intact without evidence of skin break down, no evidence of skin tears noted. IV catheter discontinued intact. Site without signs and symptoms of complications - no redness or edema noted at insertion site, patient denies  c/o pain - only slight tenderness at site.  Dressing with slight pressure applied.  D/c Instructions-Education: Discharge instructions given to patient/family with verbalized understanding. D/c education completed with patient/family including follow up instructions, medication list, d/c activities limitations if indicated, with other d/c instructions as indicated by MD - patient able to verbalize understanding, all questions fully answered. Patient instructed to return to ED, call 911, or call MD for any changes in condition.  Patient escorted via [place], and D/C home via private auto.  Hiram Comber, RN 06/07/2020 12:59 PM

## 2020-06-07 NOTE — Discharge Instructions (Signed)
Person Under Monitoring Name: [name]  Location: [address] [place] [address]   Infection Prevention Recommendations for Individuals Confirmed to have, or Being Evaluated for, 2019 Novel Coronavirus (COVID-19) Infection Who Receive Care at Home  Individuals who are confirmed to have, or are being evaluated for, COVID-19 should follow the prevention steps below until a healthcare provider or local or state health department says they can return to normal activities.  Stay home except to get medical care You should restrict activities outside your home, except for getting medical care. Do not go to work, school, or public areas, and do not use public transportation or taxis.  Call ahead before visiting your doctor Before your medical appointment, call the healthcare provider and tell them that you have, or are being evaluated for, COVID-19 infection. This will help the healthcare provider's office take steps to keep other people from getting infected. Ask your healthcare provider to call the local or state health department.  Monitor your symptoms Seek prompt medical attention if your illness is worsening (e.g., difficulty breathing). Before going to your medical appointment, call the healthcare provider and tell them that you have, or are being evaluated for, COVID-19 infection. Ask your healthcare provider to call the local or state health department.  Wear a facemask You should wear a facemask that covers your nose and mouth when you are in the same room with other people and when you visit a healthcare provider. People who live with or visit you should also wear a facemask while they are in the same room with you.  Separate yourself from other people in your home As much as possible, you should stay in a different room from other people in your home. Also, you should use a separate bathroom, if available.  Avoid sharing household items You  should not share dishes, drinking glasses, cups, eating utensils, towels, bedding, or other items with other people in your home. After using these items, you should wash them thoroughly with soap and water.  Cover your coughs and sneezes Cover your mouth and nose with a tissue when you cough or sneeze, or you can cough or sneeze into your sleeve. Throw used tissues in a lined trash can, and immediately wash your hands with soap and water for at least 20 seconds or use an alcohol-based hand rub.  Wash your [employer] your hands often and thoroughly with soap and water for at least 20 seconds. You can use an alcohol-based hand sanitizer if soap and water are not available and if your hands are not visibly dirty. Avoid touching your eyes, nose, and mouth with unwashed hands.   Prevention Steps for Caregivers and Household Members of Individuals Confirmed to have, or Being Evaluated for, COVID-19 Infection Being Cared for in the Home  If you live with, or provide care at home for, a person confirmed to have, or being evaluated for, COVID-19 infection please follow these guidelines to prevent infection:  Follow healthcare provider's instructions Make sure that you understand and can help the patient follow any healthcare provider instructions for all care.  Provide for the patient's basic needs You should help the patient with basic needs in the home and provide support for getting groceries, prescriptions, and other personal needs.  Monitor the patient's symptoms If they are getting sicker, call his or her medical provider and tell them that the patient has, or is being evaluated for, COVID-19 infection. This will help the healthcare provider's  office take steps to keep other people from getting infected. Ask the healthcare provider to call the local or state health department.  Limit the number of people who have contact with the patient  If possible, have only one caregiver for the  patient.  Other household members should stay in another home or place of residence. If this is not possible, they should stay  in another room, or be separated from the patient as much as possible. Use a separate bathroom, if available.  Restrict visitors who do not have an essential need to be in the home.  Keep older adults, very young children, and other sick people away from the patient Keep older adults, very young children, and those who have compromised immune systems or chronic health conditions away from the patient. This includes people with chronic heart, lung, or kidney conditions, diabetes, and cancer.  Ensure good ventilation Make sure that shared spaces in the home have good air flow, such as from an air conditioner or an opened window, weather permitting.  Wash your hands often  Wash your hands often and thoroughly with soap and water for at least 20 seconds. You can use an alcohol based hand sanitizer if soap and water are not available and if your hands are not visibly dirty.  Avoid touching your eyes, nose, and mouth with unwashed hands.  Use disposable paper towels to dry your hands. If not available, use dedicated cloth towels and replace them when they become wet.  Wear a facemask and gloves  Wear a disposable facemask at all times in the room and gloves when you touch or have contact with the patient's blood, body fluids, and/or secretions or excretions, such as sweat, saliva, sputum, nasal mucus, vomit, urine, or feces.  Ensure the mask fits over your nose and mouth tightly, and do not touch it during use.  Throw out disposable facemasks and gloves after using them. Do not reuse.  Wash your hands immediately after removing your facemask and gloves.  If your personal clothing becomes contaminated, carefully remove clothing and launder. Wash your hands after handling contaminated clothing.  Place all used disposable facemasks, gloves, and other waste in a lined  container before disposing them with other household waste.  Remove gloves and wash your hands immediately after handling these items.  Do not share dishes, glasses, or other household items with the patient  Avoid sharing household items. You should not share dishes, drinking glasses, cups, eating utensils, towels, bedding, or other items with a patient who is confirmed to have, or being evaluated for, COVID-19 infection.  After the person uses these items, you should wash them thoroughly with soap and water.  Wash laundry thoroughly  Immediately remove and wash clothes or bedding that have blood, body fluids, and/or secretions or excretions, such as sweat, saliva, sputum, nasal mucus, vomit, urine, or feces, on them.  Wear gloves when handling laundry from the patient.  Read and follow directions on labels of laundry or clothing items and detergent. In general, wash and dry with the warmest temperatures recommended on the label.  Clean all areas the individual has used often  Clean all touchable surfaces, such as counters, tabletops, doorknobs, bathroom fixtures, toilets, phones, keyboards, tablets, and bedside tables, every day. Also, clean any surfaces that may have blood, body fluids, and/or secretions or excretions on them.  Wear gloves when cleaning surfaces the patient has come in contact with.  Use a diluted bleach solution (e.g., dilute bleach with 1  part bleach and 10 parts water) or a household disinfectant with a label that says EPA-registered for coronaviruses. To make a bleach solution at home, add 1 tablespoon of bleach to 1 quart (4 cups) of water. For a larger supply, add  cup of bleach to 1 gallon (16 cups) of water.  Read labels of cleaning products and follow recommendations provided on product labels. Labels contain instructions for safe and effective use of the cleaning product including precautions you should take when applying the product, such as wearing gloves or  eye protection and making sure you have good ventilation during use of the product.  Remove gloves and wash hands immediately after cleaning.  Monitor yourself for signs and symptoms of illness Caregivers and household members are considered close contacts, should monitor their health, and will be asked to limit movement outside of the home to the extent possible. Follow the monitoring steps for close contacts listed on the symptom monitoring form.   ? If you have additional questions, contact your local health department or call the epidemiologist on call at [phone] (available 24/7). ? This guidance is subject to change. For the most up-to-date guidance from [facility], please refer to their website: [email or web address]

## 2020-06-07 NOTE — Progress Notes (Signed)
PATIENT DETAILS Name: [name] Age: 80 y.o. Sex: female Date of Birth: 07-Jul-1940 MRN: [id number]. Admitting Physician: Karmen Bongo, MD [id number], [name], MD  Admit Date: 06/03/2020 Discharge date: 06/07/2020  Recommendations for Outpatient Follow-up:  1. Follow up with PCP in 1-2 weeks 2. Please obtain CMP/CBC in one week  Admitted From:  Home  Disposition: Home with home health services   [place]: Yes  Equipment/Devices: None  Discharge Condition: Stable  CODE STATUS: DNR  Diet recommendation:  Diet Order            Diet - low sodium heart healthy           Diet regular Room service appropriate? Yes; Fluid consistency: Thin  Diet effective now                  Brief Narrative: Patient is a 80 y.o. female with PMHx of PAF, chronic diastolic heart failure, progressive supranuclear palsy, HTN, HLD, chronic back pain-presenting with acute metabolic encephalopathy in the setting of COVID-19 breakthrough infection.  COVID-19 vaccinated status: Vaccinated-but not boosted  Significant Events: 12/29>> Admit to [facility] for acute metabolic [id number] infection.  Significant studies: 12/28>> CT head: No acute intracranial abnormality 12/29>>Chest x-ray: No pneumonia 12/30>> chest x-ray: Prominent interstitial markings-but no obvious infiltrates  COVID-19 medications: Steroids: 12/29>> Remdesivir: 12/29>>  Antibiotics: None  Microbiology data: 12/29 >>blood culture: No growth 12/29>> urine culture: Multiple species  Procedures: None  Consults: None  Brief Hospital Course: Acute metabolic encephalopathy: Secondary to COVID-19 infection-significantly improved-continue supportive care.  Suspect have some amount of chronic cognitive dysfunction at baseline-rest patient continues to have delirium/sundowning-mostly in the evening/at night.  This morning-completely awake and alert-answering most of my questions  appropriately-suspect not far from baseline but significantly better than how she first presented to the hospital.  Stable for discharge-suspect she will do much better when she is back in a familiar home surroundings.  Presumed Covid 19 Viral pneumonia: Much improved-on room air-suspect she has small pneumonia not visible on imaging studies-inflammatory markers downtrending-treated with combination of steroid/Remdesivir-we will continue tapering steroids for a few more days on discharge.  COVID-19 Labs:  Recent Labs    06/04/20 1809 06/05/20 0216 06/06/20 0123 06/07/20 0359  DDIMER 1.10* 0.97* 0.85* 0.64*  FERRITIN 92 105 128 135  LDH 181  --   --   --   CRP 8.8* 9.0* 7.1* 3.1*    Lab Results  Component Value Date   SARSCOV2NAA POSITIVE (A) 06/04/2020   [place] NEGATIVE 12/25/2019   [place] NEGATIVE 12/17/2018   [place] NEGATIVE 12/05/2018    PAF: Currently in sinus rhythm-continue metoprolol/flecainide.  Per prior documentation-not on anticoagulation due to recurrent falls.  Chronic diastolic heart failure: Compensated-follow volume status closely  HLD: Continue statin  Progressive supranuclear palsy: Appears stable-continue Sinemet/rasagiline-resume follow-up with neurology on discharge.  CKD stage IIIa: Creatinine at baseline-follow closely  Peripheral neuropathy: Continue Neurontin/Cymbalta  OSA: Resume CPAP nightly  Obesity: Estimated body mass index is 30.73 kg/m as calculated from the following:   Height as of this encounter: 5\' 7"  (1.702 m).   Weight as of this encounter: 89 kg.   Discharge Diagnoses:  Principal Problem:   Encephalopathy due to COVID-19 virus Active Problems:   Hyperlipidemia   Unspecified glaucoma   ATRIAL FIBRILLATION   Obstructive sleep apnea   Parkinsonism (HCC)   Stage 3a chronic kidney disease (HCC)   Chronic diastolic (congestive) heart failure (HCC)   Discharge Instructions:    Person Under  Monitoring  Name: [name]  Location: [address] [place] [address]   Infection Prevention Recommendations for Individuals Confirmed to have, or Being Evaluated for, 2019 Novel Coronavirus (COVID-19) Infection Who Receive Care at Home  Individuals who are confirmed to have, or are being evaluated for, COVID-19 should follow the prevention steps below until a healthcare provider or local or state health department says they can return to normal activities.  Stay home except to get medical care You should restrict activities outside your home, except for getting medical care. Do not go to work, school, or public areas, and do not use public transportation or taxis.  Call ahead before visiting your doctor Before your medical appointment, call the healthcare provider and tell them that you have, or are being evaluated for, COVID-19 infection. This will help the healthcare provider's office take steps to keep other people from getting infected. Ask your healthcare provider to call the local or state health department.  Monitor your symptoms Seek prompt medical attention if your illness is worsening (e.g., difficulty breathing). Before going to your medical appointment, call the healthcare provider and tell them that you have, or are being evaluated for, COVID-19 infection. Ask your healthcare provider to call the local or state health department.  Wear a facemask You should wear a facemask that covers your nose and mouth when you are in the same room with other people and when you visit a healthcare provider. People who live with or visit you should also wear a facemask while they are in the same room with you.  Separate yourself from other people in your home As much as possible, you should stay in a different room from other people in your home. Also, you should use a separate bathroom, if available.  Avoid sharing household items You should not share dishes, drinking  glasses, cups, eating utensils, towels, bedding, or other items with other people in your home. After using these items, you should wash them thoroughly with soap and water.  Cover your coughs and sneezes Cover your mouth and nose with a tissue when you cough or sneeze, or you can cough or sneeze into your sleeve. Throw used tissues in a lined trash can, and immediately wash your hands with soap and water for at least 20 seconds or use an alcohol-based hand rub.  Wash your [employer] your hands often and thoroughly with soap and water for at least 20 seconds. You can use an alcohol-based hand sanitizer if soap and water are not available and if your hands are not visibly dirty. Avoid touching your eyes, nose, and mouth with unwashed hands.   Prevention Steps for Caregivers and Household Members of Individuals Confirmed to have, or Being Evaluated for, COVID-19 Infection Being Cared for in the Home  If you live with, or provide care at home for, a person confirmed to have, or being evaluated for, COVID-19 infection please follow these guidelines to prevent infection:  Follow healthcare provider's instructions Make sure that you understand and can help the patient follow any healthcare provider instructions for all care.  Provide for the patient's basic needs You should help the patient with basic needs in the home and provide support for getting groceries, prescriptions, and other personal needs.  Monitor the patient's symptoms If they are getting sicker, call his or her medical provider and tell them that the patient has, or is being evaluated for, COVID-19 infection. This will help the healthcare provider's office take steps  to keep other people from getting infected. Ask the healthcare provider to call the local or state health department.  Limit the number of people who have contact with the patient  If possible, have only one caregiver for the patient.  Other household members  should stay in another home or place of residence. If this is not possible, they should stay  in another room, or be separated from the patient as much as possible. Use a separate bathroom, if available.  Restrict visitors who do not have an essential need to be in the home.  Keep older adults, very young children, and other sick people away from the patient Keep older adults, very young children, and those who have compromised immune systems or chronic health conditions away from the patient. This includes people with chronic heart, lung, or kidney conditions, diabetes, and cancer.  Ensure good ventilation Make sure that shared spaces in the home have good air flow, such as from an air conditioner or an opened window, weather permitting.  Wash your hands often  Wash your hands often and thoroughly with soap and water for at least 20 seconds. You can use an alcohol based hand sanitizer if soap and water are not available and if your hands are not visibly dirty.  Avoid touching your eyes, nose, and mouth with unwashed hands.  Use disposable paper towels to dry your hands. If not available, use dedicated cloth towels and replace them when they become wet.  Wear a facemask and gloves  Wear a disposable facemask at all times in the room and gloves when you touch or have contact with the patient's blood, body fluids, and/or secretions or excretions, such as sweat, saliva, sputum, nasal mucus, vomit, urine, or feces.  Ensure the mask fits over your nose and mouth tightly, and do not touch it during use.  Throw out disposable facemasks and gloves after using them. Do not reuse.  Wash your hands immediately after removing your facemask and gloves.  If your personal clothing becomes contaminated, carefully remove clothing and launder. Wash your hands after handling contaminated clothing.  Place all used disposable facemasks, gloves, and other waste in a lined container before disposing them  with other household waste.  Remove gloves and wash your hands immediately after handling these items.  Do not share dishes, glasses, or other household items with the patient  Avoid sharing household items. You should not share dishes, drinking glasses, cups, eating utensils, towels, bedding, or other items with a patient who is confirmed to have, or being evaluated for, COVID-19 infection.  After the person uses these items, you should wash them thoroughly with soap and water.  Wash laundry thoroughly  Immediately remove and wash clothes or bedding that have blood, body fluids, and/or secretions or excretions, such as sweat, saliva, sputum, nasal mucus, vomit, urine, or feces, on them.  Wear gloves when handling laundry from the patient.  Read and follow directions on labels of laundry or clothing items and detergent. In general, wash and dry with the warmest temperatures recommended on the label.  Clean all areas the individual has used often  Clean all touchable surfaces, such as counters, tabletops, doorknobs, bathroom fixtures, toilets, phones, keyboards, tablets, and bedside tables, every day. Also, clean any surfaces that may have blood, body fluids, and/or secretions or excretions on them.  Wear gloves when cleaning surfaces the patient has come in contact with.  Use a diluted bleach solution (e.g., dilute bleach with 1 part bleach and  10 parts water) or a household disinfectant with a label that says EPA-registered for coronaviruses. To make a bleach solution at home, add 1 tablespoon of bleach to 1 quart (4 cups) of water. For a larger supply, add  cup of bleach to 1 gallon (16 cups) of water.  Read labels of cleaning products and follow recommendations provided on product labels. Labels contain instructions for safe and effective use of the cleaning product including precautions you should take when applying the product, such as wearing gloves or eye protection and making sure  you have good ventilation during use of the product.  Remove gloves and wash hands immediately after cleaning.  Monitor yourself for signs and symptoms of illness Caregivers and household members are considered close contacts, should monitor their health, and will be asked to limit movement outside of the home to the extent possible. Follow the monitoring steps for close contacts listed on the symptom monitoring form.   ? If you have additional questions, contact your local health department or call the epidemiologist on call at [phone] (available 24/7). ? This guidance is subject to change. For the most up-to-date guidance from CDC, please refer to their website: YouBlogs.pl    Activity:  As tolerated with Full fall precautions use walker/cane & assistance as needed  Discharge Instructions    Call MD for:  difficulty breathing, headache or visual disturbances   Complete by: As directed    Diet - low sodium heart healthy   Complete by: As directed    Discharge instructions   Complete by: As directed    Follow with Primary MD  [name], MD in 1-2 weeks  Please get a complete blood count and chemistry panel checked by your Primary MD at your next visit, and again as instructed by your Primary MD.  Get Medicines reviewed and adjusted: Please take all your medications with you for your next visit with your Primary MD  Laboratory/radiological data: Please request your Primary MD to go over all hospital tests and procedure/radiological results at the follow up, please ask your Primary MD to get all Hospital records sent to his/her office.  In some cases, they will be blood work, cultures and biopsy results pending at the time of your discharge. Please request that your primary care M.D. follows up on these results.  Also Note the following: If you experience worsening of your admission symptoms, develop  shortness of breath, life threatening emergency, suicidal or homicidal thoughts you must seek medical attention immediately by calling 911 or calling your MD immediately  if symptoms less severe.  You must read complete instructions/literature along with all the possible adverse reactions/side effects for all the Medicines you take and that have been prescribed to you. Take any new Medicines after you have completely understood and accpet all the possible adverse reactions/side effects.   Do not drive when taking Pain medications or sleeping medications (Benzodaizepines)  Do not take more than prescribed Pain, Sleep and Anxiety Medications. It is not advisable to combine anxiety,sleep and pain medications without talking with your primary care practitioner  Special Instructions: If you have smoked or chewed Tobacco  in the last 2 yrs please stop smoking, stop any regular Alcohol  and or any Recreational drug use.  Wear Seat belts while driving.  Please note: You were cared for by a hospitalist during your hospital stay. Once you are discharged, your primary care physician will handle any further medical issues. Please note that NO REFILLS for any  discharge medications will be authorized once you are discharged, as it is imperative that you return to your primary care physician (or establish a relationship with a primary care physician if you do not have one) for your post hospital discharge needs so that they can reassess your need for medications and monitor your lab values.   21 days of isolation from the day for first positive test or from the first day of your symptoms.   Increase activity slowly   Complete by: As directed      Allergies as of 06/07/2020      Reactions   Lyrica [pregabalin] Other (See Comments)   Felt loopy   Zocor [simvastatin] Other (See Comments)   Myalgias   Relafen [nabumetone] Rash      Medication List    TAKE these medications   acetaminophen 650 MG CR  tablet Commonly known as: Tylenol 8 Hour Take 1 tablet (650 mg total) by mouth every 8 (eight) hours as needed for pain.   AZO CRANBERRY URINARY TRACT PO Take 2 tablets by mouth as needed (urinary).   benzonatate 100 MG capsule Commonly known as: Tessalon Perles Take 1 capsule (100 mg total) by mouth every 6 (six) hours as needed for cough.   BIOFREEZE EX Apply 1 application topically daily as needed (pain).   carbidopa-levodopa 25-100 MG tablet Commonly known as: SINEMET IR Take 2 tablets by mouth 3 (three) times daily.   Cholecalciferol 50 MCG (2000 UT) Caps Take 4,000 Units by mouth daily.   DULoxetine 60 MG capsule Commonly known as: CYMBALTA Take 60 mg by mouth 2 (two) times daily with a meal.   flecainide 50 MG tablet Commonly known as: TAMBOCOR TAKE 1 TABLET(50 MG) BY MOUTH TWICE DAILY What changed: See the new instructions.   furosemide 20 MG tablet Commonly known as: LASIX TAKE 1 TABLET(20 MG) BY MOUTH DAILY What changed: See the new instructions.   gabapentin 300 MG capsule Commonly known as: NEURONTIN Take 300-600 mg by mouth See admin instructions. Take one capsule (300 mg) by mouth daily with lunch and supper, and take two capsules (600 mg) at bedtime.   Gemtesa 75 MG Tabs Generic drug: Vibegron Take 75 mg by mouth daily. For bladder control issues.   Melatonin 10 MG Tabs Take 1 tablet by mouth at bedtime.   metoprolol succinate 25 MG 24 hr tablet Commonly known as: TOPROL-XL Take 12.5 mg by mouth daily.   pantoprazole 40 MG tablet Commonly known as: PROTONIX Take 40 mg by mouth daily.   polyethylene glycol 17 g packet Commonly known as: MIRALAX / GLYCOLAX Take 17 g by mouth daily. What changed:   when to take this  reasons to take this   potassium chloride SA 20 MEQ tablet Commonly known as: KLOR-CON Take 1 tablet (20 mEq total) by mouth daily.   pravastatin 40 MG tablet Commonly known as: PRAVACHOL Take 40 mg by mouth daily with  lunch.   predniSONE 10 MG tablet Commonly known as: DELTASONE Take 40 mg daily for 1 day, 30 mg daily for 1 day, 20 mg daily for 1 days,10 mg daily for 1 day, then stop   rasagiline 1 MG Tabs tablet Commonly known as: AZILECT Take 1 mg by mouth daily.   Travatan Z 0.004 % Soln ophthalmic solution Generic drug: Travoprost (BAK Free) Place 1 drop into both eyes at bedtime.       Follow-up Information    [facility] Follow up.   Specialty:  Home Health Services Why: There will be a weeks delay- they will call you to set up services Contact information: [address] [place] [address] [phone]        [name], MD. Schedule an appointment as soon as possible for a visit in 1 week(s).   Specialty: Family Medicine Contact information: [address] [place] [address] [phone]        Josue Hector, MD. Schedule an appointment as soon as possible for a visit in 1 month(s).   Specialty: Cardiology Contact information: [id number] [place] [address] [phone]              Allergies  Allergen Reactions  . Lyrica [Pregabalin] Other (See Comments)    Felt loopy  . Zocor [Simvastatin] Other (See Comments)    Myalgias  . Relafen [Nabumetone] Rash    Other Procedures/Studies: DG Chest 1 View  Result Date: 06/04/2020 CLINICAL DATA:  Slurred speech and difficulty with ambulation EXAM: CHEST  1 VIEW COMPARISON:  December 04, 2018 FINDINGS: Lungs are clear. Heart is borderline enlarged with pulmonary vascularity normal. No adenopathy. There is aortic atherosclerosis. There is postoperative change in the mid to lower thoracic spine and visualized upper lumbar spine regions. IMPRESSION: No edema or airspace opacity.  Heart borderline enlarged. Aortic Atherosclerosis (ICD10-I70.0). Electronically Signed   By: Lowella Grip III M.D.   On: 06/04/2020 09:26   CT Head Wo Contrast  Result Date:  06/03/2020 CLINICAL DATA:  Mental status change, unknown cause Confusion and slurred speech. EXAM: CT HEAD WITHOUT CONTRAST TECHNIQUE: Contiguous axial images were obtained from the base of the skull through the vertex without intravenous contrast. COMPARISON:  Head CT 12/25/2019 FINDINGS: Brain: Stable degree of atrophy and chronic small vessel ischemia no intracranial hemorrhage, mass effect, or midline shift. No hydrocephalus. The basilar cisterns are patent. Remote lacunar infarct versus prominent perivascular space in the left basal ganglia, unchanged. No evidence of territorial infarct or acute ischemia. No extra-axial or intracranial fluid collection. Vascular: Atherosclerosis of skullbase vasculature without hyperdense vessel or abnormal calcification. Skull: No fracture or focal lesion. Sinuses/Orbits: Paranasal sinuses and mastoid air cells are clear. The visualized orbits are unremarkable. Bilateral cataract resection. Other: None. IMPRESSION: 1. No acute intracranial abnormality. 2. Stable atrophy and chronic small vessel ischemia. Electronically Signed   By: Keith Rake M.D.   On: 06/03/2020 20:15   DG Chest Port 1V same Day  Result Date: 06/05/2020 CLINICAL DATA:  Shortness of breath, COVID. EXAM: PORTABLE CHEST 1 VIEW COMPARISON:  06/04/2020 and prior. FINDINGS: No pneumothorax, focal consolidation or pleural effusion. Stable cardiomediastinal silhouette. No acute osseous abnormality. Posterior spinal fusion hardware. IMPRESSION: No focal airspace disease. Electronically Signed   By: Primitivo Gauze M.D.   On: 06/05/2020 07:56     TODAY-DAY OF DISCHARGE:  Subjective:   [name] today has no headache,no chest abdominal pain,no new weakness tingling or numbness, feels much better wants to go home today.   Objective:   Blood pressure (!) 147/73, pulse 70, temperature 98.2 F (36.8 C), temperature source Oral, resp. rate 17, height 5\' 7"  (1.702 m), weight 89 kg, SpO2  99 %.  Intake/Output Summary (Last 24 hours) at 06/07/2020 1106 Last data filed at 06/07/2020 1047 Gross per 24 hour  Intake -  Output 450 ml  Net -450 ml   Filed Weights   06/05/20 1416  Weight: 89 kg    Exam: Awake Alert,  Oriented *3, No new F.N deficits, Normal affect .AT,PERRAL Supple Neck,No JVD, No cervical lymphadenopathy appriciated.  Symmetrical Chest wall movement, Good air movement bilaterally, CTAB RRR,No Gallops,Rubs or new Murmurs, No Parasternal Heave +ve B.Sounds, Abd Soft, Non tender, No organomegaly appriciated, No rebound -guarding or rigidity. No Cyanosis, Clubbing or edema, No new Rash or bruise   PERTINENT RADIOLOGIC STUDIES: DG Chest 1 View  Result Date: 06/04/2020 CLINICAL DATA:  Slurred speech and difficulty with ambulation EXAM: CHEST  1 VIEW COMPARISON:  December 04, 2018 FINDINGS: Lungs are clear. Heart is borderline enlarged with pulmonary vascularity normal. No adenopathy. There is aortic atherosclerosis. There is postoperative change in the mid to lower thoracic spine and visualized upper lumbar spine regions. IMPRESSION: No edema or airspace opacity.  Heart borderline enlarged. Aortic Atherosclerosis (ICD10-I70.0). Electronically Signed   By: Lowella Grip III M.D.   On: 06/04/2020 09:26   CT Head Wo Contrast  Result Date: 06/03/2020 CLINICAL DATA:  Mental status change, unknown cause Confusion and slurred speech. EXAM: CT HEAD WITHOUT CONTRAST TECHNIQUE: Contiguous axial images were obtained from the base of the skull through the vertex without intravenous contrast. COMPARISON:  Head CT 12/25/2019 FINDINGS: Brain: Stable degree of atrophy and chronic small vessel ischemia no intracranial hemorrhage, mass effect, or midline shift. No hydrocephalus. The basilar cisterns are patent. Remote lacunar infarct versus prominent perivascular space in the left basal ganglia, unchanged. No evidence of territorial infarct or acute ischemia. No extra-axial or  intracranial fluid collection. Vascular: Atherosclerosis of skullbase vasculature without hyperdense vessel or abnormal calcification. Skull: No fracture or focal lesion. Sinuses/Orbits: Paranasal sinuses and mastoid air cells are clear. The visualized orbits are unremarkable. Bilateral cataract resection. Other: None. IMPRESSION: 1. No acute intracranial abnormality. 2. Stable atrophy and chronic small vessel ischemia. Electronically Signed   By: Keith Rake M.D.   On: 06/03/2020 20:15   DG Chest Port 1V same Day  Result Date: 06/05/2020 CLINICAL DATA:  Shortness of breath, COVID. EXAM: PORTABLE CHEST 1 VIEW COMPARISON:  06/04/2020 and prior. FINDINGS: No pneumothorax, focal consolidation or pleural effusion. Stable cardiomediastinal silhouette. No acute osseous abnormality. Posterior spinal fusion hardware. IMPRESSION: No focal airspace disease. Electronically Signed   By: Primitivo Gauze M.D.   On: 06/05/2020 07:56     PERTINENT LAB RESULTS: CBC: Recent Labs    06/06/20 0409 06/07/20 0359  WBC 2.5* 4.8  HGB 13.2 13.6  HCT 39.4 43.1  PLT 145* 184   CMET CMP     Component Value Date/Time   NA 138 06/07/2020 0359   K 3.6 06/07/2020 0359   CL 103 06/07/2020 0359   CO2 25 06/07/2020 0359   GLUCOSE 127 (H) 06/07/2020 0359   BUN 20 06/07/2020 0359   CREATININE 0.89 06/07/2020 0359   CREATININE 0.93 01/11/2019 1547   CALCIUM 9.1 06/07/2020 0359   PROT 6.7 06/07/2020 0359   ALBUMIN 3.7 06/07/2020 0359   AST 20 06/07/2020 0359   ALT 10 06/07/2020 0359   ALKPHOS 94 06/07/2020 0359   BILITOT 0.3 06/07/2020 0359   GFRNONAA >60 06/07/2020 0359   GFRAA >60 12/25/2019 2145    GFR Estimated Creatinine Clearance: 60.7 mL/min (by C-G formula based on SCr of 0.89 mg/dL). No results for input(s): LIPASE, AMYLASE in the last 72 hours. No results for input(s): CKTOTAL, CKMB, CKMBINDEX, TROPONINI in the last 72 hours. Invalid input(s): POCBNP Recent Labs    06/06/20 0123  06/07/20 0359  DDIMER 0.85* 0.64*   No results for input(s):  HGBA1C in the last 72 hours. No results for input(s): CHOL, HDL, LDLCALC, TRIG, CHOLHDL, LDLDIRECT in the last 72 hours. No results for input(s): TSH, T4TOTAL, T3FREE, THYROIDAB in the last 72 hours.  Invalid input(s): FREET3 Recent Labs    06/06/20 0123 06/07/20 0359  FERRITIN 128 135   Coags: No results for input(s): INR in the last 72 hours.  Invalid input(s): PT Microbiology: Recent Results (from the past 240 hour(s))  Urine culture     Status: Abnormal   Collection Time: 06/04/20  3:00 AM   Specimen: Urine, Random  Result Value Ref Range Status   Specimen Description URINE, RANDOM  Final   Special Requests   Final    NONE Performed at [facility], [address] [place], [place] [address]    Culture MULTIPLE SPECIES PRESENT, SUGGEST RECOLLECTION (A)  Final   Report Status 06/05/2020 FINAL  Final  Resp Panel by RT-PCR (Flu A&B, Covid) Nasopharyngeal Swab     Status: Abnormal   Collection Time: 06/04/20 11:00 AM   Specimen: Nasopharyngeal Swab; Nasopharyngeal(NP) swabs in vial transport medium  Result Value Ref Range Status   SARS Coronavirus 2 by RT PCR POSITIVE (A) NEGATIVE Final    Comment: emailed L. Berdik RN 12:55 06/04/20 ([email or web address]) (NOTE) SARS-CoV-2 target nucleic acids are DETECTED.  The SARS-CoV-2 RNA is generally detectable in upper respiratory specimens during the acute phase of infection. Positive results are indicative of the presence of the identified virus, but do not rule out bacterial infection or co-infection with other pathogens not detected by the test. Clinical correlation with patient history and other diagnostic information is necessary to determine patient infection status. The expected result is Negative.  Fact Sheet for Patients: EntrepreneurPulse.com.au  Fact Sheet for Healthcare Providers: IncredibleEmployment.be  This test is  not yet approved or cleared by the [place] FDA and  has been authorized for detection and/or diagnosis of SARS-CoV-2 by FDA under an Emergency Use Authorization (EUA).  This EUA will remain in effect (meaning this test can be used) for the duration of  the COVID- 19 declaration under Section 564(b)(1) of the Act, 21 U.S.C. section 360bbb-3(b)(1), unless the authorization is terminated or revoked sooner.     Influenza A by PCR NEGATIVE NEGATIVE Final   Influenza B by PCR NEGATIVE NEGATIVE Final    Comment: (NOTE) The Xpert Xpress SARS-CoV-2/FLU/RSV plus assay is intended as an aid in the diagnosis of influenza from Nasopharyngeal swab specimens and should not be used as a sole basis for treatment. Nasal washings and aspirates are unacceptable for Xpert Xpress SARS-CoV-2/FLU/RSV testing.  Fact Sheet for Patients: EntrepreneurPulse.com.au  Fact Sheet for Healthcare Providers: IncredibleEmployment.be  This test is not yet approved or cleared by the [place] FDA and has been authorized for detection and/or diagnosis of SARS-CoV-2 by FDA under an Emergency Use Authorization (EUA). This EUA will remain in effect (meaning this test can be used) for the duration of the COVID-19 declaration under Section 564(b)(1) of the Act, 21 U.S.C. section 360bbb-3(b)(1), unless the authorization is terminated or revoked.  Performed at [facility], [place] [address], [place], [place] [address]   Culture, blood (routine x 2)     Status: None (Preliminary result)   Collection Time: 06/04/20  1:13 PM   Specimen: BLOOD LEFT ARM  Result Value Ref Range Status   Specimen Description BLOOD LEFT ARM  Final   Special Requests   Final    BOTTLES DRAWN AEROBIC AND ANAEROBIC  Blood Culture adequate volume   Culture   Final    NO GROWTH 3 DAYS Performed at [facility], [address], [place], [place] [address]    Report Status PENDING  Incomplete     FURTHER DISCHARGE INSTRUCTIONS:  Get Medicines reviewed and adjusted: Please take all your medications with you for your next visit with your Primary MD  Laboratory/radiological data: Please request your Primary MD to go over all hospital tests and procedure/radiological results at the follow up, please ask your Primary MD to get all Hospital records sent to his/her office.  In some cases, they will be blood work, cultures and biopsy results pending at the time of your discharge. Please request that your primary care M.D. goes through all the records of your hospital data and follows up on these results.  Also Note the following: If you experience worsening of your admission symptoms, develop shortness of breath, life threatening emergency, suicidal or homicidal thoughts you must seek medical attention immediately by calling 911 or calling your MD immediately  if symptoms less severe.  You must read complete instructions/literature along with all the possible adverse reactions/side effects for all the Medicines you take and that have been prescribed to you. Take any new Medicines after you have completely understood and accpet all the possible adverse reactions/side effects.   Do not drive when taking Pain medications or sleeping medications (Benzodaizepines)  Do not take more than prescribed Pain, Sleep and Anxiety Medications. It is not advisable to combine anxiety,sleep and pain medications without talking with your primary care practitioner  Special Instructions: If you have smoked or chewed Tobacco  in the last 2 yrs please stop smoking, stop any regular Alcohol  and or any Recreational drug use.  Wear Seat belts while driving.  Please note: You were cared for by a hospitalist during your hospital stay. Once you are discharged, your primary care physician will handle any further medical issues. Please note that NO REFILLS for any discharge medications will be authorized once you  are discharged, as it is imperative that you return to your primary care physician (or establish a relationship with a primary care physician if you do not have one) for your post hospital discharge needs so that they can reassess your need for medications and monitor your lab values.  Total Time spent coordinating discharge including counseling, education and face to face time equals 35 minutes.  SignedJeoffrey Massed 06/07/2020 11:06 AM

## 2020-06-07 NOTE — Progress Notes (Signed)
PHYSICAL THERAPY EVALUATION  Clinical Impression:  Pt with level of function stated below. Pt has 24/7 care and evaluating PT recommended HHPT.    06/06/20 1000  PT Visit Information  Last PT Received On 06/06/20  History of Present Illness Pt adm with acute encephalopathy due to Covid 19. PMH - multiple back surgeries, chf, HTN, Parkinson's, afib.  Precautions  Precautions Fall  Home Living  Family/patient expects to be discharged to: Private residence  Living Arrangements Spouse/significant other  Available Help at Discharge Family;Other (Comment) (hired caregivers)  Type of Home House  Home Access Stairs to enter  [employer] of Steps 4  Entrance Stairs-Rails Right  Home Layout One level  Bathroom Shower/Tub Tub/shower unit  Bathroom Toilet Handicapped height  Bathroom Accessibility Yes  Home Equipment Walker - 2 wheels;Walker - 4 wheels;Cane - single point;Shower seat;Wheelchair - manual  Additional Comments lt AFO  Prior Function  Level of Independence Needs assistance  Gait / Transfers Assistance Needed Amb with 4 wheeled walker of some kind. Reports modified independent at times assist from caregivers  ADL's / Homemaking Assistance Needed Assist for bathing and dressing  Communication  Communication No difficulties  Pain Assessment  Pain Assessment 0-10  Pain Score 5  Pain Location bil feet  Pain Descriptors / Indicators Pins and needles  Pain Intervention(s) Limited activity within patient's tolerance;Monitored during session;Repositioned  Cognition  Arousal/Alertness Awake/alert  Behavior During Therapy WFL for tasks assessed/performed  Overall Cognitive Status No family/caregiver present to determine baseline cognitive functioning  General Comments Pt oriented but with some hallucinations. Seeing a book where there was none. Follows commands. Slow processing (likely baseline).  Upper Extremity Assessment  Upper Extremity Assessment Defer to OT  evaluation  Lower Extremity Assessment  Lower Extremity Assessment Generalized weakness;LLE deficits/detail  LLE Deficits / Details 0/5 ankle  Bed Mobility  Overal bed mobility Needs Assistance  Bed Mobility Supine to Sit;Rolling  Rolling Min assist  Supine to sit Mod assist  General bed mobility comments Assist to elevate trunk and bring hips to EOB.  Transfers  Overall transfer level Needs assistance  Equipment used 4-wheeled walker  Transfers Sit to/from [employer] Transfers  Sit to Stand Mod assist;Min assist  Stand pivot transfers Min assist  General transfer comment Assist to bring hips up and for balance. Pt with posterior bias. Mod assist from bed and recliner. Min assist from bsc.  Ambulation/Gait  Ambulation/Gait assistance Min assist  Gait Distance (Feet) 20 Feet  Assistive device 4-wheeled walker  Gait Pattern/deviations Step-to pattern;Decreased step length - right;Decreased step length - left;Shuffle;Trunk flexed  General Gait Details Assist for balance and support. Difficulty initiating with pivoting steps  Gait velocity decr  Gait velocity interpretation <1.8 ft/sec, indicate of risk for recurrent falls  Balance  Overall balance assessment Needs assistance  Sitting-balance support Bilateral upper extremity supported;Feet supported  Sitting balance-Leahy Scale Poor  Sitting balance - Comments Needs initial min assist due to posterior lean then min guard.  Postural control Posterior lean  Standing balance support Bilateral upper extremity supported  Standing balance-Leahy Scale Poor  Standing balance comment walker and min assist for static standing  General Comments  General comments (skin integrity, edema, etc.) VSS on RA  PT - End of Session  Equipment Utilized During Treatment Gait belt  Activity Tolerance Patient tolerated treatment well  Patient left in chair;with call bell/phone within reach;with chair alarm set  Nurse Communication Mobility status   PT Assessment  PT Recommendation/Assessment Patient needs continued PT  services  PT Visit Diagnosis Unsteadiness on feet (R26.81);Other abnormalities of gait and mobility (R26.89);Muscle weakness (generalized) (M62.81)  PT Problem List Decreased strength;Decreased activity tolerance;Decreased balance;Decreased mobility  PT Plan  PT Frequency (ACUTE ONLY) Min 3X/week  PT Treatment/Interventions (ACUTE ONLY) DME instruction;Gait training;Functional mobility training;Therapeutic activities;Therapeutic exercise;Balance training;Patient/family education  AM-PAC PT "6 Clicks" Mobility Outcome Measure (Version 2)  Help needed turning from your back to your side while in a flat bed without using bedrails? 3  Help needed moving from lying on your back to sitting on the side of a flat bed without using bedrails? 2  Help needed moving to and from a bed to a chair (including a wheelchair)? 2  Help needed standing up from a chair using your arms (e.g., wheelchair or bedside chair)? 2  Help needed to walk in hospital room? 3  Help needed climbing 3-5 steps with a railing?  2  6 Click Score 14  Consider Recommendation of Discharge To: CIR/SNF/LTACH  PT Recommendation  Follow Up Recommendations Home health PT;Supervision/Assistance - 24 hour  PT equipment None recommended by PT  Individuals Consulted  Consulted and Agree with Results and Recommendations Patient  Acute Rehab PT Goals  Patient Stated Goal return home  PT Goal Formulation With patient  Time For Goal Achievement 06/20/20  Potential to Achieve Goals Fair  PT Time Calculation  PT Start Time (ACUTE ONLY) 1123  PT Stop Time (ACUTE ONLY) 1220  PT Time Calculation (min) (ACUTE ONLY) 57 min  PT General Charges  $$ ACUTE PT VISIT 1 Visit  PT Evaluation  $PT Eval Moderate Complexity 1 Mod  PT Treatments  $Gait Training 23-37 mins  $Therapeutic Activity 8-22 mins  Written Expression  Dominant Hand Right   Evaluation completed by [facility], PT 06/06/20.  Note filed 06/07/20 by   Dani Gobble. Migdalia Dk PT, DPT Acute Rehabilitation Services Pager [phone] Office [phone]

## 2020-06-09 ENCOUNTER — Emergency Department (HOSPITAL_COMMUNITY)
Admission: EM | Admit: 2020-06-09 | Discharge: 2020-06-09 | Disposition: A | Discharge status: Home / Self Care | Payer: HMO | Attending: Emergency Medicine | Admitting: Emergency Medicine

## 2020-06-09 ENCOUNTER — Other Ambulatory Visit: Payer: Self-pay

## 2020-06-09 DIAGNOSIS — G20 Parkinson's disease: Secondary | ICD-10-CM | POA: Diagnosis not present

## 2020-06-09 DIAGNOSIS — I5032 Chronic diastolic (congestive) heart failure: Secondary | ICD-10-CM | POA: Diagnosis not present

## 2020-06-09 DIAGNOSIS — Z8616 Personal history of COVID-19: Secondary | ICD-10-CM | POA: Diagnosis not present

## 2020-06-09 DIAGNOSIS — N3001 Acute cystitis with hematuria: Secondary | ICD-10-CM | POA: Insufficient documentation

## 2020-06-09 DIAGNOSIS — R82998 Other abnormal findings in urine: Secondary | ICD-10-CM | POA: Diagnosis present

## 2020-06-09 DIAGNOSIS — R4182 Altered mental status, unspecified: Secondary | ICD-10-CM | POA: Insufficient documentation

## 2020-06-09 DIAGNOSIS — R2681 Unsteadiness on feet: Secondary | ICD-10-CM | POA: Diagnosis not present

## 2020-06-09 DIAGNOSIS — I10 Essential (primary) hypertension: Secondary | ICD-10-CM | POA: Diagnosis not present

## 2020-06-09 DIAGNOSIS — N1831 Chronic kidney disease, stage 3a: Secondary | ICD-10-CM | POA: Insufficient documentation

## 2020-06-09 DIAGNOSIS — Z79899 Other long term (current) drug therapy: Secondary | ICD-10-CM | POA: Insufficient documentation

## 2020-06-09 DIAGNOSIS — I130 Hypertensive heart and chronic kidney disease with heart failure and stage 1 through stage 4 chronic kidney disease, or unspecified chronic kidney disease: Secondary | ICD-10-CM | POA: Insufficient documentation

## 2020-06-09 DIAGNOSIS — R5381 Other malaise: Secondary | ICD-10-CM | POA: Diagnosis not present

## 2020-06-09 LAB — BASIC METABOLIC PANEL
Anion gap: 12 (ref 5–15)
BUN: 25 mg/dL — ABNORMAL HIGH (ref 8–23)
CO2: 24 mmol/L (ref 22–32)
Calcium: 9.2 mg/dL (ref 8.9–10.3)
Chloride: 103 mmol/L (ref 98–111)
Creatinine, Ser: 1.14 mg/dL — ABNORMAL HIGH (ref 0.44–1.00)
GFR, Estimated: 50 mL/min — ABNORMAL LOW (ref >60)
Glucose, Bld: 100 mg/dL — ABNORMAL HIGH (ref 70–99)
Potassium: 4.1 mmol/L (ref 3.5–5.1)
Sodium: 139 mmol/L (ref 135–145)

## 2020-06-09 LAB — URINALYSIS, ROUTINE W REFLEX MICROSCOPIC
Bilirubin Urine: NEGATIVE
Glucose, UA: NEGATIVE mg/dL
Ketones, ur: 5 mg/dL — AB
Nitrite: POSITIVE — AB
Protein, ur: 100 mg/dL — AB
Specific Gravity, Urine: 1.023 (ref 1.005–1.030)
WBC, UA: >50 WBC/hpf — ABNORMAL HIGH (ref 0–5)
pH: 6 (ref 5.0–8.0)

## 2020-06-09 LAB — CBC
HCT: 44.7 % (ref 36.0–46.0)
Hemoglobin: 13.6 g/dL (ref 12.0–15.0)
MCH: 27 pg (ref 26.0–34.0)
MCHC: 30.4 g/dL (ref 30.0–36.0)
MCV: 88.7 fL (ref 80.0–100.0)
Platelets: 178 10*3/uL (ref 150–400)
RBC: 5.04 MIL/uL (ref 3.87–5.11)
RDW: 15.2 % (ref 11.5–15.5)
WBC: 8.9 10*3/uL (ref 4.0–10.5)
nRBC: 0 % (ref 0.0–0.2)

## 2020-06-09 LAB — CULTURE, BLOOD (ROUTINE X 2)
Culture: NO GROWTH
Special Requests: ADEQUATE

## 2020-06-09 MED ORDER — CEPHALEXIN 500 MG PO CAPS: 500.0000 mg | ORAL_CAPSULE | Freq: Four times a day (QID) | ORAL | 0 refills | Status: DC

## 2020-06-09 MED ORDER — LACTATED RINGERS IV BOLUS
500.0000 mL | Freq: Once | INTRAVENOUS | Status: AC
  Administered 2020-06-09: 500 mL via INTRAVENOUS

## 2020-06-09 MED ORDER — SODIUM CHLORIDE 0.9 % IV SOLN
1.0000 g | Freq: Once | INTRAVENOUS | Status: AC
  Administered 2020-06-09: 1 g via INTRAVENOUS
  Filled 2020-06-09: qty 10

## 2020-06-09 NOTE — Discharge Instructions (Addendum)
[name] is noted to have a bladder infection.  That could explain the reason why she is having worsening weakness.  As discussed, we have started her on antibiotics and our case management team has coordinated efforts to get home health care to you sooner.  We will have to make sure that [name] is getting proper hydration and antibiotics.  We have to make sure that she is getting better and prevent falls.  Return to the ER if her symptoms are getting worse.

## 2020-06-09 NOTE — ED Provider Notes (Signed)
[facility] EMERGENCY DEPARTMENT Provider Note   CSN: [id number] Arrival date & time: 06/09/20  1019     History No chief complaint on file.   [name] is a 80 y.o. female.  HPI     80 year old female with history of CHF, paroxysmal A. Fib on anticoagulation, recently diagnosed with COVID-19 comes in with chief complaint of altered mental status.  Patient is fully vaccinated.  Patient was discharged from the hospital on 12-30.  According to the family, although patient's ental status is improved since she was discharged, her weakness has gotten worse.  Her urine was profoundly foul-smelling and dark.  They are concerned that she might have UTI as in the past she has had weakness like this with UTI and confusion with UTI.  Patient appears to be hallucinating.  I spoke with patient's daughter, Ms. Katharine Look.  She tells me that they have 24/7 caregivers, however they are not medically trained and it has been hard to move patient around.  Patient was discharged from the hospital recently and PT has not followed up with them yet.  No known falls.  Patient is oriented to self and location.  She is aware that she is recovering from Covid and states that she is feeling fine.  She denies any focal numbness, weakness, slurred speech or confusion.  She does indicate some discomfort with urination.  Past Medical History:  Diagnosis Date  . Allergic rhinitis   . Arthritis    "all over"  . CHF (congestive heart failure) ([place])   . Chronic back pain   . DJD (degenerative joint disease)   . 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 GERD (gastroesophageal reflux disease)   . Hyperlipidemia   . Hypertension   . Lumbar stenosis   . OSA (obstructive sleep apnea)    "couldn't handle CPAP; use mouth guard some; not all the time" (01/06/2014)  . PAF (paroxysmal atrial fibrillation) ([place])   . Scoliosis    with radiculopathy L2-S1 with prior surgery  . Small bowel obstruction ([facility])     versus ileus after last bck surgery  . Spondylosis     Patient Active Problem List   Diagnosis Date Noted  . Encephalopathy due to COVID-19 virus 06/04/2020  . 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 UTI (urinary tract infection) 12/06/2018  . 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 Spinal stenosis 12/05/2018  . AKI (acute kidney injury) ([place]) 12/05/2018  . Acute metabolic encephalopathy [id number]  . Chronic diastolic (congestive) heart failure ([place]) 10/07/2018  . GERD (gastroesophageal reflux disease) 10/07/2018  . Abnormal glucose tolerance test 07/01/2018  . Colon, diverticulosis 07/01/2018  . 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 Glaucoma 07/01/2018  . Stage 3a chronic kidney disease ([place]) 07/01/2018  . Neuropathy 07/01/2018  . Pure hypercholesterolemia 07/01/2018  . Restless leg 07/01/2018  . Discitis of thoracolumbar region 07/01/2018  . Acute bronchitis due to infection 05/04/2017  . Cough 04/21/2016  . Preoperative cardiovascular examination 04/18/2015  . Parkinsonism ([place]) 05/15/2014  . Chronic anticoagulation -warfarin therapy 02/03/2014  . Atrial fibrillation with RVR ([place]) 01/31/2014  . Gait instability 11/16/2013  . Edema 03/03/2010  . ATRIAL FIBRILLATION 04/23/2008  . Hyperlipidemia 01/31/2008  . Obstructive sleep apnea 01/31/2008  . Unspecified glaucoma 01/30/2008  . Seasonal and perennial allergic rhinitis 01/30/2008  . Osteoarthritis 01/30/2008    Past Surgical History:  Procedure Laterality Date  . BACK SURGERY    . CATARACT EXTRACTION W/ INTRAOCULAR LENS  IMPLANT, BILATERAL  2011  .  CHOLECYSTECTOMY N/A 07/07/2018   Procedure: LAPAROSCOPIC CHOLECYSTECTOMY;  Surgeon: Erroll Luna, MD;  Location: [facility];  Service: General;  Laterality: N/A;  . COLONOSCOPY  12/09/2004  . LIPOMA EXCISION  1980's   "fatty tumors"  . [place] SURGERY  02/2009   "ruptured disc"  . NASAL SEPTUM SURGERY  80's  . POSTERIOR LUMBAR FUSION  06/2010; 10/2011   "placed  screws, rods, spacers both times"  . POSTERIOR LUMBAR FUSION 4 LEVEL N/A 12/13/2018   Procedure: THORACIC NINE -LUMBAR THREE POSTERIOR INSTRUMENTATION FUSION;  Surgeon: Judith Part, MD;  Location: [facility];  Service: Neurosurgery;  Laterality: N/A;  . REPAIR DURAL / CSF LEAK  02/2009  . THORACIC LAMINECTOMY FOR EPIDURAL ABSCESS N/A 09/28/2018   Procedure: THORACIC NINE-THORACIC TEN, THORACIC TEN-THORACIC ELEVEN, THORACIC ELEVEN-THORACIC TWELVE LAMINECTOMIES FOR EPIDURAL ABSCESS;  Surgeon: Judith Part, MD;  Location: [facility];  Service: Neurosurgery;  Laterality: N/A;  . TOTAL ABDOMINAL HYSTERECTOMY  02/1993     OB History   No obstetric history on file.     Family History  Problem Relation Age of Onset  . Arthritis Mother   . Heart attack Father   . Hypertension Sister   . Lung cancer Sister   . Asthma Sister   . Allergies Sister     Social History   Tobacco Use  . Smoking status: Never Smoker  . Smokeless tobacco: Never Used  Vaping Use  . Vaping Use: Never used  Substance Use Topics  . Alcohol use: No    Alcohol/week: 0.0 standard drinks  . Drug use: No    Home Medications Prior to Admission medications   Medication Sig Start Date End Date Taking? Authorizing Provider  acetaminophen (TYLENOL 8 HOUR) 650 MG CR tablet Take 1 tablet (650 mg total) by mouth every 8 (eight) hours as needed for pain. 12/17/19   Suzy Bouchard, PA-C  benzonatate (TESSALON PERLES) 100 MG capsule Take 1 capsule (100 mg total) by mouth every 6 (six) hours as needed for cough. 06/07/20 06/07/21  Ghimire, Henreitta Leber, MD  carbidopa-levodopa (SINEMET IR) 25-100 MG tablet Take 2 tablets by mouth 3 (three) times daily. 04/10/20   TatEustace Quail, DO  Cholecalciferol 50 MCG (2000 UT) CAPS Take 4,000 Units by mouth daily.    [provider]  Cranberry-Vitamin C (AZO CRANBERRY URINARY TRACT PO) Take 2 tablets by mouth as needed (urinary).    [provider]  DULoxetine (CYMBALTA) 60 MG  capsule Take 60 mg by mouth 2 (two) times daily with a meal. 10/25/19   [provider]  flecainide (TAMBOCOR) 50 MG tablet TAKE 1 TABLET(50 MG) BY MOUTH TWICE DAILY Patient taking differently: Take 50 mg by mouth 2 (two) times daily. 05/27/20   Josue Hector, MD  furosemide (LASIX) 20 MG tablet TAKE 1 TABLET(20 MG) BY MOUTH DAILY Patient taking differently: Take 20 mg by mouth daily. 04/28/20   Bhagat, Crista Luria, PA  gabapentin (NEURONTIN) 300 MG capsule Take 300-600 mg by mouth See admin instructions. Take one capsule (300 mg) by mouth daily with lunch and supper, and take two capsules (600 mg) at bedtime.    [provider]  Melatonin 10 MG TABS Take 1 tablet by mouth at bedtime.    [provider]  Menthol, Topical Analgesic, (BIOFREEZE EX) Apply 1 application topically daily as needed (pain).     [provider]  metoprolol succinate (TOPROL-XL) 25 MG 24 hr tablet Take 12.5 mg by mouth daily.  [provider]  pantoprazole (PROTONIX) 40 MG tablet Take 40 mg by mouth daily.     [provider]  polyethylene glycol (MIRALAX / GLYCOLAX) packet Take 17 g by mouth daily. Patient taking differently: Take 17 g by mouth daily as needed for mild constipation. 07/10/18   Raiford Noble Latif, DO  potassium chloride SA (K-DUR) 20 MEQ tablet Take 1 tablet (20 mEq total) by mouth daily. 01/01/19   Bhagat, Crista Luria, PA  pravastatin (PRAVACHOL) 40 MG tablet Take 40 mg by mouth daily with lunch.    [provider]  predniSONE (DELTASONE) 10 MG tablet Take 40 mg daily for 1 day, 30 mg daily for 1 day, 20 mg daily for 1 days,10 mg daily for 1 day, then stop 06/07/20   Ghimire, Henreitta Leber, MD  rasagiline (AZILECT) 1 MG TABS tablet Take 1 mg by mouth daily. 05/24/20   [provider]  TRAVATAN Z 0.004 % SOLN ophthalmic solution Place 1 drop into both eyes at bedtime.  01/21/15   [provider]  Vibegron (GEMTESA) 75 MG TABS Take 75  mg by mouth daily. For bladder control issues.    [provider]    Allergies    Lyrica [pregabalin], Zocor [simvastatin], and Relafen [nabumetone]  Review of Systems   Review of Systems  Constitutional: Positive for activity change and fatigue.  Respiratory: Positive for cough.   Cardiovascular: Negative for chest pain.  Gastrointestinal: Positive for nausea.  Hematological: Bruises/bleeds easily.  All other systems reviewed and are negative.   Physical Exam Updated Vital Signs BP (!) 186/87   Pulse 66   Temp 98.2 F (36.8 C) (Oral)   Resp 13   SpO2 100%   Physical Exam Vitals and nursing note reviewed.  Constitutional:      Appearance: She is well-developed.  HENT:     Head: Normocephalic and atraumatic.  Eyes:     Extraocular Movements: EOM normal.  Cardiovascular:     Rate and Rhythm: Normal rate.  Pulmonary:     Effort: Pulmonary effort is normal.  Abdominal:     General: Bowel sounds are normal.  Musculoskeletal:     Cervical back: Normal range of motion and neck supple.  Skin:    General: Skin is warm and dry.  Neurological:     Mental Status: She is alert.     Sensory: No sensory deficit.     Motor: No weakness.     ED Results / Procedures / Treatments   Labs (all labs ordered are listed, but only abnormal results are displayed) Labs Reviewed  BASIC METABOLIC PANEL - Abnormal; Notable for the following components:      Result Value   Glucose, Bld 100 (*)    BUN 25 (*)    Creatinine, Ser 1.14 (*)    GFR, Estimated 50 (*)    All other components within normal limits  URINALYSIS, ROUTINE W REFLEX MICROSCOPIC - Abnormal; Notable for the following components:   Color, Urine AMBER (*)    APPearance TURBID (*)    Hgb urine dipstick SMALL (*)    Ketones, ur 5 (*)    Protein, ur 100 (*)    Nitrite POSITIVE (*)    Leukocytes,Ua LARGE (*)    WBC, UA >50 (*)    Bacteria, UA MANY (*)    Non Squamous Epithelial 6-10 (*)    All other  components within normal limits  URINE CULTURE  CBC  CBG MONITORING, ED  EKG EKG Interpretation  Date/Time:  Monday June 09 2020 10:25:10 EST Ventricular Rate:  88 PR Interval:  198 QRS Duration: 84 QT Interval:  360 QTC Calculation: 435 R Axis:   12 Text Interpretation: Sinus rhythm with occasional Premature ventricular complexes Septal infarct , age undetermined Abnormal ECG No acute changes No significant change since last tracing Confirmed by Derwood Kaplan [phone]) on 06/09/2020 5:04:48 PM   Radiology No results found.  Procedures Procedures (including critical care time)  Medications Ordered in ED Medications  lactated ringers bolus 500 mL (500 mLs Intravenous New Bag/Given 06/09/20 1802)    ED Course  I have reviewed the triage vital signs and the nursing notes.  Pertinent labs & imaging results that were available during my care of the patient were reviewed by me and considered in my medical decision making (see chart for details).    MDM Rules/Calculators/A&P                          80 year old female who is recovering from COVID-19 is brought into the ER with chief complaint of weakness.  Since being discharged, her mental status/encephalopathy appears to have improved but her weakness has worsened.  Family has noted dark urine that is foul-smelling and patient has had similar clinical change with UTI in the past, therefore they are concerned for it.  Of note, it appears that she has been incontinent more recently and patient informs me that she is having some discomfort with urination.  We give her a low bit of hydration in the ER.  Her labs are overall reassuring besides slightly elevated creatinine.  Urine analysis is concerning for infection -and with a high pretest probability for it, we will start antibiotics.  It appears that patient has not gotten home health yet.  No social barriers as patient lives in our community and has some level of home health  care at all times. I discussed the case with case management and they were able to accommodate patient with another facility that will start delivering here sooner.  They have already called patient's daughter and looks like they are all in agreement with the plan.  I will also call patient's daughter and inform her about the UTI diagnosis.   Anticipate discharge with strict ER return precautions.  Final Clinical Impression(s) / ED Diagnoses Final diagnoses:  Acute cystitis with hematuria  Altered mental status, unspecified altered mental status type  History of COVID-19    Rx / DC Orders ED Discharge Orders    None       Derwood Kaplan, MD 06/09/20 1853

## 2020-06-09 NOTE — ED Notes (Signed)
Pt unable to ambulate without significant assistance.

## 2020-06-09 NOTE — ED Triage Notes (Signed)
Pt from home via EMS for eval of weakness. Went to stand up from bedside commode and legs gave out, was lowered back to the commode without injury. Denies LOC. Suspected UTI, new incontinence. Dx with covid on 12/29.

## 2020-06-09 NOTE — Progress Notes (Signed)
   06/09/20 1925  TOC ED Mini Assessment  PING Used in TOC Assessment No  Admission or Readmission Diverted No  What brought you to the Emergency Department?  covid symptoms  Barrier interventions Patient is Covid positive but stable, as per EDP, this patient would benefit for covid hosptial to home program. ED CM offered services to patient and family. Discussed this with daugthers Selena Batten and Andrey Campanile over the phone [phone], who is agreeable also informed me that patient was set up with [facility] with [facility] no one has contacted them as of yet. CM offered to try arrange with with [facility] agency made family aware of the limitation due to staffing issue, permission fax referral to [facility], [facility] and [facility].  Referral also faxed to [facility] for Covid to home program,ED CM will follow up tomorrow

## 2020-06-10 ENCOUNTER — Telehealth: Payer: Self-pay | Admitting: Surgery

## 2020-06-10 NOTE — Telephone Encounter (Signed)
ED CM called to follow up with patient and family and was informed that [facility] will be coming out tomorrow to see patient along with [facility]. No further ED CM needs identified.

## 2020-06-11 DIAGNOSIS — G9340 Encephalopathy, unspecified: Secondary | ICD-10-CM | POA: Diagnosis not present

## 2020-06-11 DIAGNOSIS — G629 Polyneuropathy, unspecified: Secondary | ICD-10-CM | POA: Diagnosis not present

## 2020-06-11 DIAGNOSIS — M4620 Osteomyelitis of vertebra, site unspecified: Secondary | ICD-10-CM | POA: Diagnosis not present

## 2020-06-11 DIAGNOSIS — I480 Paroxysmal atrial fibrillation: Secondary | ICD-10-CM | POA: Diagnosis not present

## 2020-06-11 DIAGNOSIS — Z9181 History of falling: Secondary | ICD-10-CM | POA: Diagnosis not present

## 2020-06-11 DIAGNOSIS — H409 Unspecified glaucoma: Secondary | ICD-10-CM | POA: Diagnosis not present

## 2020-06-11 DIAGNOSIS — N1831 Chronic kidney disease, stage 3a: Secondary | ICD-10-CM | POA: Diagnosis not present

## 2020-06-11 DIAGNOSIS — U071 COVID-19: Secondary | ICD-10-CM | POA: Diagnosis not present

## 2020-06-11 DIAGNOSIS — G20 Parkinson's disease: Secondary | ICD-10-CM | POA: Diagnosis not present

## 2020-06-11 DIAGNOSIS — I130 Hypertensive heart and chronic kidney disease with heart failure and stage 1 through stage 4 chronic kidney disease, or unspecified chronic kidney disease: Secondary | ICD-10-CM | POA: Diagnosis not present

## 2020-06-11 DIAGNOSIS — M21379 Foot drop, unspecified foot: Secondary | ICD-10-CM | POA: Diagnosis not present

## 2020-06-11 DIAGNOSIS — G4733 Obstructive sleep apnea (adult) (pediatric): Secondary | ICD-10-CM | POA: Diagnosis not present

## 2020-06-11 DIAGNOSIS — M48061 Spinal stenosis, lumbar region without neurogenic claudication: Secondary | ICD-10-CM | POA: Diagnosis not present

## 2020-06-11 DIAGNOSIS — M4645 Discitis, unspecified, thoracolumbar region: Secondary | ICD-10-CM | POA: Diagnosis not present

## 2020-06-11 DIAGNOSIS — N390 Urinary tract infection, site not specified: Secondary | ICD-10-CM | POA: Diagnosis not present

## 2020-06-11 DIAGNOSIS — K219 Gastro-esophageal reflux disease without esophagitis: Secondary | ICD-10-CM | POA: Diagnosis not present

## 2020-06-11 DIAGNOSIS — I5032 Chronic diastolic (congestive) heart failure: Secondary | ICD-10-CM | POA: Diagnosis not present

## 2020-06-11 DIAGNOSIS — G2581 Restless legs syndrome: Secondary | ICD-10-CM | POA: Diagnosis not present

## 2020-06-11 DIAGNOSIS — K56699 Other intestinal obstruction unspecified as to partial versus complete obstruction: Secondary | ICD-10-CM | POA: Diagnosis not present

## 2020-06-11 DIAGNOSIS — K5730 Diverticulosis of large intestine without perforation or abscess without bleeding: Secondary | ICD-10-CM | POA: Diagnosis not present

## 2020-06-11 DIAGNOSIS — E785 Hyperlipidemia, unspecified: Secondary | ICD-10-CM | POA: Diagnosis not present

## 2020-06-11 DIAGNOSIS — M419 Scoliosis, unspecified: Secondary | ICD-10-CM | POA: Diagnosis not present

## 2020-06-11 DIAGNOSIS — M1991 Primary osteoarthritis, unspecified site: Secondary | ICD-10-CM | POA: Diagnosis not present

## 2020-06-11 LAB — URINE CULTURE: Culture: >=100000 — AB

## 2020-06-13 NOTE — Discharge Summary (Signed)
PATIENT DETAILS Name: [name] Age: 80 y.o. Sex: female Date of Birth: 02-18-1941 MRN: [id number]. Admitting Physician: Karmen Bongo, MD [id number], [name], MD  Admit Date: 06/03/2020 Discharge date: 06/07/2020  Recommendations for Outpatient Follow-up:  1. Follow up with PCP in 1-2 weeks 2. Please obtain CMP/CBC in one week  Admitted From:  Home  Disposition: Home with home health services   [employer]: Yes  Equipment/Devices: None  Discharge Condition: Stable  CODE STATUS: DNR  Diet recommendation:  Diet Order            Diet - low sodium heart healthy                  Brief Narrative: Patient is a 80 y.o. female with PMHx of PAF, chronic diastolic heart failure, progressive supranuclear palsy, HTN, HLD, chronic back pain-presenting with acute metabolic encephalopathy in the setting of COVID-19 breakthrough infection.  COVID-19 vaccinated status: Vaccinated-but not boosted  Significant Events: 12/29>> Admit to [facility] for acute metabolic [id number] infection.  Significant studies: 12/28>> CT head: No acute intracranial abnormality 12/29>>Chest x-ray: No pneumonia 12/30>> chest x-ray: Prominent interstitial markings-but no obvious infiltrates  COVID-19 medications: Steroids: 12/29>> Remdesivir: 12/29>>  Antibiotics: None  Microbiology data: 12/29 >>blood culture: No growth 12/29>> urine culture: Multiple species  Procedures: None  Consults: None  Brief Hospital Course: Acute metabolic encephalopathy: Secondary to COVID-19 infection-significantly improved-continue supportive care.  Suspect have some amount of chronic cognitive dysfunction at baseline-rest patient continues to have delirium/sundowning-mostly in the evening/at night.  This morning-completely awake and alert-answering most of my questions appropriately-suspect not far from baseline but significantly better than how she first presented to the  hospital.  Stable for discharge-suspect she will do much better when she is back in a familiar home surroundings.  Presumed Covid 19 Viral pneumonia: Much improved-on room air-suspect she has small pneumonia not visible on imaging studies-inflammatory markers downtrending-treated with combination of steroid/Remdesivir-we will continue tapering steroids for a few more days on discharge.  COVID-19 Labs:  No results for input(s): DDIMER, FERRITIN, LDH, CRP in the last 72 hours.  Lab Results  Component Value Date   SARSCOV2NAA POSITIVE (A) 06/04/2020   [place] NEGATIVE 12/25/2019   [place] NEGATIVE 12/17/2018   [place] NEGATIVE 12/05/2018    PAF: Currently in sinus rhythm-continue metoprolol/flecainide.  Per prior documentation-not on anticoagulation due to recurrent falls.  Chronic diastolic heart failure: Compensated-follow volume status closely  HLD: Continue statin  Progressive supranuclear palsy: Appears stable-continue Sinemet/rasagiline-resume follow-up with neurology on discharge.  CKD stage IIIa: Creatinine at baseline-follow closely  Peripheral neuropathy: Continue Neurontin/Cymbalta  OSA: Resume CPAP nightly  Obesity: Estimated body mass index is 30.73 kg/m as calculated from the following:   Height as of this encounter: 5\' 7"  (1.702 m).   Weight as of this encounter: 89 kg.   Discharge Diagnoses:  Principal Problem:   Encephalopathy due to COVID-19 virus Active Problems:   Hyperlipidemia   Unspecified glaucoma   ATRIAL FIBRILLATION   Obstructive sleep apnea   Parkinsonism (HCC)   Stage 3a chronic kidney disease (HCC)   Chronic diastolic (congestive) heart failure [facility])   Discharge Instructions:    Person Under Monitoring Name: DAVIA LAUTZENHEISER  Location: [place] [address]   Infection Prevention Recommendations for Individuals Confirmed to have, or Being Evaluated for, 2019 Novel Coronavirus (COVID-19)  Infection Who Receive Care at Home  Individuals who are confirmed to have, or are being evaluated for, COVID-19 should follow the  prevention steps below until a healthcare provider or local or state health department says they can return to normal activities.  Stay home except to get medical care You should restrict activities outside your home, except for getting medical care. Do not go to work, school, or public areas, and do not use public transportation or taxis.  Call ahead before visiting your doctor Before your medical appointment, call the healthcare provider and tell them that you have, or are being evaluated for, COVID-19 infection. This will help the healthcare provider's office take steps to keep other people from getting infected. Ask your healthcare provider to call the local or state health department.  Monitor your symptoms Seek prompt medical attention if your illness is worsening (e.g., difficulty breathing). Before going to your medical appointment, call the healthcare provider and tell them that you have, or are being evaluated for, COVID-19 infection. Ask your healthcare provider to call the local or state health department.  Wear a facemask You should wear a facemask that covers your nose and mouth when you are in the same room with other people and when you visit a healthcare provider. People who live with or visit you should also wear a facemask while they are in the same room with you.  Separate yourself from other people in your home As much as possible, you should stay in a different room from other people in your home. Also, you should use a separate bathroom, if available.  Avoid sharing household items You should not share dishes, drinking glasses, cups, eating utensils, towels, bedding, or other items with other people in your home. After using these items, you should wash them thoroughly with soap and water.  Cover your coughs and sneezes Cover your  mouth and nose with a tissue when you cough or sneeze, or you can cough or sneeze into your sleeve. Throw used tissues in a lined trash can, and immediately wash your hands with soap and water for at least 20 seconds or use an alcohol-based hand rub.  Wash your [facility] your hands often and thoroughly with soap and water for at least 20 seconds. You can use an alcohol-based hand sanitizer if soap and water are not available and if your hands are not visibly dirty. Avoid touching your eyes, nose, and mouth with unwashed hands.   Prevention Steps for Caregivers and Household Members of Individuals Confirmed to have, or Being Evaluated for, COVID-19 Infection Being Cared for in the Home  If you live with, or provide care at home for, a person confirmed to have, or being evaluated for, COVID-19 infection please follow these guidelines to prevent infection:  Follow healthcare provider's instructions Make sure that you understand and can help the patient follow any healthcare provider instructions for all care.  Provide for the patient's basic needs You should help the patient with basic needs in the home and provide support for getting groceries, prescriptions, and other personal needs.  Monitor the patient's symptoms If they are getting sicker, call his or her medical provider and tell them that the patient has, or is being evaluated for, COVID-19 infection. This will help the healthcare provider's office take steps to keep other people from getting infected. Ask the healthcare provider to call the local or state health department.  Limit the number of people who have contact with the patient  If possible, have only one caregiver for the patient.  Other household members should stay in another home or place of residence. If  this is not possible, they should stay  in another room, or be separated from the patient as much as possible. Use a separate bathroom, if available.  Restrict  visitors who do not have an essential need to be in the home.  Keep older adults, very young children, and other sick people away from the patient Keep older adults, very young children, and those who have compromised immune systems or chronic health conditions away from the patient. This includes people with chronic heart, lung, or kidney conditions, diabetes, and cancer.  Ensure good ventilation Make sure that shared spaces in the home have good air flow, such as from an air conditioner or an opened window, weather permitting.  Wash your hands often  Wash your hands often and thoroughly with soap and water for at least 20 seconds. You can use an alcohol based hand sanitizer if soap and water are not available and if your hands are not visibly dirty.  Avoid touching your eyes, nose, and mouth with unwashed hands.  Use disposable paper towels to dry your hands. If not available, use dedicated cloth towels and replace them when they become wet.  Wear a facemask and gloves  Wear a disposable facemask at all times in the room and gloves when you touch or have contact with the patient's blood, body fluids, and/or secretions or excretions, such as sweat, saliva, sputum, nasal mucus, vomit, urine, or feces.  Ensure the mask fits over your nose and mouth tightly, and do not touch it during use.  Throw out disposable facemasks and gloves after using them. Do not reuse.  Wash your hands immediately after removing your facemask and gloves.  If your personal clothing becomes contaminated, carefully remove clothing and launder. Wash your hands after handling contaminated clothing.  Place all used disposable facemasks, gloves, and other waste in a lined container before disposing them with other household waste.  Remove gloves and wash your hands immediately after handling these items.  Do not share dishes, glasses, or other household items with the patient  Avoid sharing household items. You  should not share dishes, drinking glasses, cups, eating utensils, towels, bedding, or other items with a patient who is confirmed to have, or being evaluated for, COVID-19 infection.  After the person uses these items, you should wash them thoroughly with soap and water.  Wash laundry thoroughly  Immediately remove and wash clothes or bedding that have blood, body fluids, and/or secretions or excretions, such as sweat, saliva, sputum, nasal mucus, vomit, urine, or feces, on them.  Wear gloves when handling laundry from the patient.  Read and follow directions on labels of laundry or clothing items and detergent. In general, wash and dry with the warmest temperatures recommended on the label.  Clean all areas the individual has used often  Clean all touchable surfaces, such as counters, tabletops, doorknobs, bathroom fixtures, toilets, phones, keyboards, tablets, and bedside tables, every day. Also, clean any surfaces that may have blood, body fluids, and/or secretions or excretions on them.  Wear gloves when cleaning surfaces the patient has come in contact with.  Use a diluted bleach solution (e.g., dilute bleach with 1 part bleach and 10 parts water) or a household disinfectant with a label that says EPA-registered for coronaviruses. To make a bleach solution at home, add 1 tablespoon of bleach to 1 quart (4 cups) of water. For a larger supply, add  cup of bleach to 1 gallon (16 cups) of water.  Read labels of cleaning  products and follow recommendations provided on product labels. Labels contain instructions for safe and effective use of the cleaning product including precautions you should take when applying the product, such as wearing gloves or eye protection and making sure you have good ventilation during use of the product.  Remove gloves and wash hands immediately after cleaning.  Monitor yourself for signs and symptoms of illness Caregivers and household members are considered  close contacts, should monitor their health, and will be asked to limit movement outside of the home to the extent possible. Follow the monitoring steps for close contacts listed on the symptom monitoring form.   ? If you have additional questions, contact your local health department or call the epidemiologist on call at [phone] (available 24/7). ? This guidance is subject to change. For the most up-to-date guidance from CDC, please refer to their website: YouBlogs.pl    Activity:  As tolerated with Full fall precautions use walker/cane & assistance as needed  Discharge Instructions    Call MD for:  difficulty breathing, headache or visual disturbances   Complete by: As directed    Diet - low sodium heart healthy   Complete by: As directed    Discharge instructions   Complete by: As directed    Follow with Primary MD  [name], MD in 1-2 weeks  Please get a complete blood count and chemistry panel checked by your Primary MD at your next visit, and again as instructed by your Primary MD.  Get Medicines reviewed and adjusted: Please take all your medications with you for your next visit with your Primary MD  Laboratory/radiological data: Please request your Primary MD to go over all hospital tests and procedure/radiological results at the follow up, please ask your Primary MD to get all Hospital records sent to his/her office.  In some cases, they will be blood work, cultures and biopsy results pending at the time of your discharge. Please request that your primary care M.D. follows up on these results.  Also Note the following: If you experience worsening of your admission symptoms, develop shortness of breath, life threatening emergency, suicidal or homicidal thoughts you must seek medical attention immediately by calling 911 or calling your MD immediately  if symptoms less severe.  You must read complete  instructions/literature along with all the possible adverse reactions/side effects for all the Medicines you take and that have been prescribed to you. Take any new Medicines after you have completely understood and accpet all the possible adverse reactions/side effects.   Do not drive when taking Pain medications or sleeping medications (Benzodaizepines)  Do not take more than prescribed Pain, Sleep and Anxiety Medications. It is not advisable to combine anxiety,sleep and pain medications without talking with your primary care practitioner  Special Instructions: If you have smoked or chewed Tobacco  in the last 2 yrs please stop smoking, stop any regular Alcohol  and or any Recreational drug use.  Wear Seat belts while driving.  Please note: You were cared for by a hospitalist during your hospital stay. Once you are discharged, your primary care physician will handle any further medical issues. Please note that NO REFILLS for any discharge medications will be authorized once you are discharged, as it is imperative that you return to your primary care physician (or establish a relationship with a primary care physician if you do not have one) for your post hospital discharge needs so that they can reassess your need for medications and monitor your lab  values.   21 days of isolation from the day for first positive test or from the first day of your symptoms.   Increase activity slowly   Complete by: As directed      Allergies as of 06/07/2020      Reactions   Lyrica [pregabalin] Other (See Comments)   Felt loopy   Zocor [simvastatin] Other (See Comments)   Myalgias   Relafen [nabumetone] Rash      Medication List    TAKE these medications   acetaminophen 650 MG CR tablet Commonly known as: Tylenol 8 Hour Take 1 tablet (650 mg total) by mouth every 8 (eight) hours as needed for pain.   AZO CRANBERRY URINARY TRACT PO Take 2 tablets by mouth as needed (urinary).   benzonatate 100 MG  capsule Commonly known as: Tessalon Perles Take 1 capsule (100 mg total) by mouth every 6 (six) hours as needed for cough.   BIOFREEZE EX Apply 1 application topically daily as needed (pain).   carbidopa-levodopa 25-100 MG tablet Commonly known as: SINEMET IR Take 2 tablets by mouth 3 (three) times daily.   Cholecalciferol 50 MCG (2000 UT) Caps Take 4,000 Units by mouth daily.   DULoxetine 60 MG capsule Commonly known as: CYMBALTA Take 60 mg by mouth 2 (two) times daily with a meal.   flecainide 50 MG tablet Commonly known as: TAMBOCOR TAKE 1 TABLET(50 MG) BY MOUTH TWICE DAILY What changed: See the new instructions.   furosemide 20 MG tablet Commonly known as: LASIX TAKE 1 TABLET(20 MG) BY MOUTH DAILY What changed: See the new instructions.   gabapentin 300 MG capsule Commonly known as: NEURONTIN Take 300-600 mg by mouth See admin instructions. Take one capsule (300 mg) by mouth daily with lunch and supper, and take two capsules (600 mg) at bedtime.   Gemtesa 75 MG Tabs Generic drug: Vibegron Take 75 mg by mouth daily. For bladder control issues.   Melatonin 10 MG Tabs Take 1 tablet by mouth at bedtime.   metoprolol succinate 25 MG 24 hr tablet Commonly known as: TOPROL-XL Take 12.5 mg by mouth daily.   pantoprazole 40 MG tablet Commonly known as: PROTONIX Take 40 mg by mouth daily.   polyethylene glycol 17 g packet Commonly known as: MIRALAX / GLYCOLAX Take 17 g by mouth daily. What changed:   when to take this  reasons to take this   potassium chloride SA 20 MEQ tablet Commonly known as: KLOR-CON Take 1 tablet (20 mEq total) by mouth daily.   pravastatin 40 MG tablet Commonly known as: PRAVACHOL Take 40 mg by mouth daily with lunch.   predniSONE 10 MG tablet Commonly known as: DELTASONE Take 40 mg daily for 1 day, 30 mg daily for 1 day, 20 mg daily for 1 days,10 mg daily for 1 day, then stop   rasagiline 1 MG Tabs tablet Commonly known as:  AZILECT Take 1 mg by mouth daily.   Travatan Z 0.004 % Soln ophthalmic solution Generic drug: Travoprost (BAK Free) Place 1 drop into both eyes at bedtime.       Follow-up Information    [facility] Follow up.   Specialty: [place] Why: There will be a weeks delay- they will call you to set up services Contact information: [address] [place] [address] [phone]        [name], MD. Schedule an appointment as soon as possible for a visit in 1 week(s).  Specialty: Family Medicine Contact information: [address] [place] [address] [phone]        Josue Hector, MD. Schedule an appointment as soon as possible for a visit in 1 month(s).   Specialty: Cardiology Contact information: [address] [place] [address] [phone]              Allergies  Allergen Reactions  . Lyrica [Pregabalin] Other (See Comments)    Felt loopy  . Zocor [Simvastatin] Other (See Comments)    Myalgias  . Relafen [Nabumetone] Rash    Other Procedures/Studies: DG Chest 1 View  Result Date: 06/04/2020 CLINICAL DATA:  Slurred speech and difficulty with ambulation EXAM: CHEST  1 VIEW COMPARISON:  December 04, 2018 FINDINGS: Lungs are clear. Heart is borderline enlarged with pulmonary vascularity normal. No adenopathy. There is aortic atherosclerosis. There is postoperative change in the mid to lower thoracic spine and visualized upper lumbar spine regions. IMPRESSION: No edema or airspace opacity.  Heart borderline enlarged. Aortic Atherosclerosis (ICD10-I70.0). Electronically Signed   By: Lowella Grip III M.D.   On: 06/04/2020 09:26   CT Head Wo Contrast  Result Date: 06/03/2020 CLINICAL DATA:  Mental status change, unknown cause Confusion and slurred speech. EXAM: CT HEAD WITHOUT CONTRAST TECHNIQUE: Contiguous axial images were obtained from the base of the skull through the vertex without  intravenous contrast. COMPARISON:  Head CT 12/25/2019 FINDINGS: Brain: Stable degree of atrophy and chronic small vessel ischemia no intracranial hemorrhage, mass effect, or midline shift. No hydrocephalus. The basilar cisterns are patent. Remote lacunar infarct versus prominent perivascular space in the left basal ganglia, unchanged. No evidence of territorial infarct or acute ischemia. No extra-axial or intracranial fluid collection. Vascular: Atherosclerosis of skullbase vasculature without hyperdense vessel or abnormal calcification. Skull: No fracture or focal lesion. Sinuses/Orbits: Paranasal sinuses and mastoid air cells are clear. The visualized orbits are unremarkable. Bilateral cataract resection. Other: None. IMPRESSION: 1. No acute intracranial abnormality. 2. Stable atrophy and chronic small vessel ischemia. Electronically Signed   By: Keith Rake M.D.   On: 06/03/2020 20:15   DG Chest Port 1V same Day  Result Date: 06/05/2020 CLINICAL DATA:  Shortness of breath, COVID. EXAM: PORTABLE CHEST 1 VIEW COMPARISON:  06/04/2020 and prior. FINDINGS: No pneumothorax, focal consolidation or pleural effusion. Stable cardiomediastinal silhouette. No acute osseous abnormality. Posterior spinal fusion hardware. IMPRESSION: No focal airspace disease. Electronically Signed   By: Primitivo Gauze M.D.   On: 06/05/2020 07:56     TODAY-DAY OF DISCHARGE:  Subjective:   [name] today has no headache,no chest abdominal pain,no new weakness tingling or numbness, feels much better wants to go home today.   Objective:   Blood pressure (!) 147/73, pulse 70, temperature 98.2 F (36.8 C), temperature source Oral, resp. rate 17, height 5\' 7"  (1.702 m), weight 89 kg, SpO2 99 %. No intake or output data in the 24 hours ending 06/13/20 1456 Filed Weights   06/05/20 1416  Weight: 89 kg    Exam: Awake Alert, Oriented *3, No new F.N deficits, Normal affect [place].AT,PERRAL Supple Neck,No JVD, No  cervical lymphadenopathy appriciated.  Symmetrical Chest wall movement, Good air movement bilaterally, CTAB RRR,No Gallops,Rubs or new Murmurs, No Parasternal Heave +ve B.Sounds, Abd Soft, Non tender, No organomegaly appriciated, No rebound -guarding or rigidity. No Cyanosis, Clubbing or edema, No new Rash or bruise   PERTINENT RADIOLOGIC STUDIES: DG Chest 1 View  Result Date: 06/04/2020 CLINICAL DATA:  Slurred  speech and difficulty with ambulation EXAM: CHEST  1 VIEW COMPARISON:  December 04, 2018 FINDINGS: Lungs are clear. Heart is borderline enlarged with pulmonary vascularity normal. No adenopathy. There is aortic atherosclerosis. There is postoperative change in the mid to lower thoracic spine and visualized upper lumbar spine regions. IMPRESSION: No edema or airspace opacity.  Heart borderline enlarged. Aortic Atherosclerosis (ICD10-I70.0). Electronically Signed   By: Lowella Grip III M.D.   On: 06/04/2020 09:26   CT Head Wo Contrast  Result Date: 06/03/2020 CLINICAL DATA:  Mental status change, unknown cause Confusion and slurred speech. EXAM: CT HEAD WITHOUT CONTRAST TECHNIQUE: Contiguous axial images were obtained from the base of the skull through the vertex without intravenous contrast. COMPARISON:  Head CT 12/25/2019 FINDINGS: Brain: Stable degree of atrophy and chronic small vessel ischemia no intracranial hemorrhage, mass effect, or midline shift. No hydrocephalus. The basilar cisterns are patent. Remote lacunar infarct versus prominent perivascular space in the left basal ganglia, unchanged. No evidence of territorial infarct or acute ischemia. No extra-axial or intracranial fluid collection. Vascular: Atherosclerosis of skullbase vasculature without hyperdense vessel or abnormal calcification. Skull: No fracture or focal lesion. Sinuses/Orbits: Paranasal sinuses and mastoid air cells are clear. The visualized orbits are unremarkable. Bilateral cataract resection. Other: None.  IMPRESSION: 1. No acute intracranial abnormality. 2. Stable atrophy and chronic small vessel ischemia. Electronically Signed   By: Keith Rake M.D.   On: 06/03/2020 20:15   DG Chest Port 1V same Day  Result Date: 06/05/2020 CLINICAL DATA:  Shortness of breath, COVID. EXAM: PORTABLE CHEST 1 VIEW COMPARISON:  06/04/2020 and prior. FINDINGS: No pneumothorax, focal consolidation or pleural effusion. Stable cardiomediastinal silhouette. No acute osseous abnormality. Posterior spinal fusion hardware. IMPRESSION: No focal airspace disease. Electronically Signed   By: Primitivo Gauze M.D.   On: 06/05/2020 07:56     PERTINENT LAB RESULTS: CBC: No results for input(s): WBC, HGB, HCT, PLT in the last 72 hours. CMET CMP     Component Value Date/Time   NA 139 06/09/2020 1038   K 4.1 06/09/2020 1038   CL 103 06/09/2020 1038   CO2 24 06/09/2020 1038   GLUCOSE 100 (H) 06/09/2020 1038   BUN 25 (H) 06/09/2020 1038   CREATININE 1.14 (H) 06/09/2020 1038   CREATININE 0.93 01/11/2019 1547   CALCIUM 9.2 06/09/2020 1038   PROT 6.7 06/07/2020 0359   ALBUMIN 3.7 06/07/2020 0359   AST 20 06/07/2020 0359   ALT 10 06/07/2020 0359   ALKPHOS 94 06/07/2020 0359   BILITOT 0.3 06/07/2020 0359   GFRNONAA 50 (L) 06/09/2020 1038   GFRAA >60 12/25/2019 2145    GFR Estimated Creatinine Clearance: 47.4 mL/min (A) (by C-G formula based on SCr of 1.14 mg/dL (H)). No results for input(s): LIPASE, AMYLASE in the last 72 hours. No results for input(s): CKTOTAL, CKMB, CKMBINDEX, TROPONINI in the last 72 hours. Invalid input(s): POCBNP No results for input(s): DDIMER in the last 72 hours. No results for input(s): HGBA1C in the last 72 hours. No results for input(s): CHOL, HDL, LDLCALC, TRIG, CHOLHDL, LDLDIRECT in the last 72 hours. No results for input(s): TSH, T4TOTAL, T3FREE, THYROIDAB in the last 72 hours.  Invalid input(s): FREET3 No results for input(s): VITAMINB12, FOLATE, FERRITIN, TIBC, IRON,  RETICCTPCT in the last 72 hours. Coags: No results for input(s): INR in the last 72 hours.  Invalid input(s): PT Microbiology: Recent Results (from the past 240 hour(s))  Urine culture     Status: Abnormal   Collection Time:  06/04/20  3:00 AM   Specimen: Urine, Random  Result Value Ref Range Status   Specimen Description URINE, RANDOM  Final   Special Requests   Final    NONE Performed at [facility], [address] [place], [place] [address]    Culture MULTIPLE SPECIES PRESENT, SUGGEST RECOLLECTION (A)  Final   Report Status 06/05/2020 FINAL  Final  Resp Panel by RT-PCR (Flu A&B, Covid) Nasopharyngeal Swab     Status: Abnormal   Collection Time: 06/04/20 11:00 AM   Specimen: Nasopharyngeal Swab; Nasopharyngeal(NP) swabs in vial transport medium  Result Value Ref Range Status   SARS Coronavirus 2 by RT PCR POSITIVE (A) NEGATIVE Final    Comment: emailed L. Berdik RN 12:55 06/04/20 ([email or web address]) (NOTE) SARS-CoV-2 target nucleic acids are DETECTED.  The SARS-CoV-2 RNA is generally detectable in upper respiratory specimens during the acute phase of infection. Positive results are indicative of the presence of the identified virus, but do not rule out bacterial infection or co-infection with other pathogens not detected by the test. Clinical correlation with patient history and other diagnostic information is necessary to determine patient infection status. The expected result is Negative.  Fact Sheet for Patients: EntrepreneurPulse.com.au  Fact Sheet for Healthcare Providers: IncredibleEmployment.be  This test is not yet approved or cleared by the [place] FDA and  has been authorized for detection and/or diagnosis of SARS-CoV-2 by FDA under an Emergency Use Authorization (EUA).  This EUA will remain in effect (meaning this test can be used) for the duration of  the COVID- 19 declaration under Section 564(b)(1) of the Act,  21 U.S.C. section 360bbb-3(b)(1), unless the authorization is terminated or revoked sooner.     Influenza A by PCR NEGATIVE NEGATIVE Final   Influenza B by PCR NEGATIVE NEGATIVE Final    Comment: (NOTE) The Xpert Xpress SARS-CoV-2/FLU/RSV plus assay is intended as an aid in the diagnosis of influenza from Nasopharyngeal swab specimens and should not be used as a sole basis for treatment. Nasal washings and aspirates are unacceptable for Xpert Xpress SARS-CoV-2/FLU/RSV testing.  Fact Sheet for Patients: EntrepreneurPulse.com.au  Fact Sheet for Healthcare Providers: IncredibleEmployment.be  This test is not yet approved or cleared by the [place] FDA and has been authorized for detection and/or diagnosis of SARS-CoV-2 by FDA under an Emergency Use Authorization (EUA). This EUA will remain in effect (meaning this test can be used) for the duration of the COVID-19 declaration under Section 564(b)(1) of the Act, 21 U.S.C. section 360bbb-3(b)(1), unless the authorization is terminated or revoked.  Performed at [facility], [place] [address] [place], [place] [address]   Culture, blood (routine x 2)     Status: None   Collection Time: 06/04/20  1:13 PM   Specimen: BLOOD LEFT ARM  Result Value Ref Range Status   Specimen Description BLOOD LEFT ARM  Final   Special Requests   Final    BOTTLES DRAWN AEROBIC AND ANAEROBIC Blood Culture adequate volume   Culture   Final    NO GROWTH 5 DAYS Performed at [facility], [address], [place], [place] [address]    Report Status 06/09/2020 FINAL  Final  Urine culture     Status: Abnormal   Collection Time: 06/09/20  5:42 PM   Specimen: Urine, Random  Result Value Ref Range Status   Specimen Description URINE, RANDOM  Final   Special Requests   Final    NONE Performed at [facility]  Lab, [address] [place], [place] [address]    Culture (A)  Final    >=100,000 COLONIES/mL  PROTEUS MIRABILIS >=100,000 COLONIES/mL ESCHERICHIA COLI    Report Status 06/11/2020 FINAL  Final   Organism ID, Bacteria ESCHERICHIA COLI (A)  Final   Organism ID, Bacteria PROTEUS MIRABILIS (A)  Final      Susceptibility   Escherichia coli - MIC*    AMPICILLIN <=2 SENSITIVE Sensitive     CEFAZOLIN <=4 SENSITIVE Sensitive     CEFEPIME <=0.12 SENSITIVE Sensitive     CEFTRIAXONE <=0.25 SENSITIVE Sensitive     CIPROFLOXACIN <=0.25 SENSITIVE Sensitive     GENTAMICIN <=1 SENSITIVE Sensitive     IMIPENEM <=0.25 SENSITIVE Sensitive     NITROFURANTOIN <=16 SENSITIVE Sensitive     TRIMETH/SULFA <=20 SENSITIVE Sensitive     AMPICILLIN/SULBACTAM <=2 SENSITIVE Sensitive     PIP/TAZO <=4 SENSITIVE Sensitive     * >=100,000 COLONIES/mL ESCHERICHIA COLI   Proteus mirabilis - MIC*    AMPICILLIN <=2 SENSITIVE Sensitive     CEFAZOLIN <=4 SENSITIVE Sensitive     CEFEPIME <=0.12 SENSITIVE Sensitive     CEFTRIAXONE <=0.25 SENSITIVE Sensitive     CIPROFLOXACIN <=0.25 SENSITIVE Sensitive     GENTAMICIN <=1 SENSITIVE Sensitive     IMIPENEM 2 SENSITIVE Sensitive     NITROFURANTOIN 128 RESISTANT Resistant     TRIMETH/SULFA <=20 SENSITIVE Sensitive     AMPICILLIN/SULBACTAM <=2 SENSITIVE Sensitive     PIP/TAZO <=4 SENSITIVE Sensitive     * >=100,000 COLONIES/mL PROTEUS MIRABILIS    FURTHER DISCHARGE INSTRUCTIONS:  Get Medicines reviewed and adjusted: Please take all your medications with you for your next visit with your Primary MD  Laboratory/radiological data: Please request your Primary MD to go over all hospital tests and procedure/radiological results at the follow up, please ask your Primary MD to get all Hospital records sent to his/her office.  In some cases, they will be blood work, cultures and biopsy results pending at the time of your discharge. Please request that your primary care M.D. goes through all the records of your hospital data and follows up on these results.  Also Note the  following: If you experience worsening of your admission symptoms, develop shortness of breath, life threatening emergency, suicidal or homicidal thoughts you must seek medical attention immediately by calling 911 or calling your MD immediately  if symptoms less severe.  You must read complete instructions/literature along with all the possible adverse reactions/side effects for all the Medicines you take and that have been prescribed to you. Take any new Medicines after you have completely understood and accpet all the possible adverse reactions/side effects.   Do not drive when taking Pain medications or sleeping medications (Benzodaizepines)  Do not take more than prescribed Pain, Sleep and Anxiety Medications. It is not advisable to combine anxiety,sleep and pain medications without talking with your primary care practitioner  Special Instructions: If you have smoked or chewed Tobacco  in the last 2 yrs please stop smoking, stop any regular Alcohol  and or any Recreational drug use.  Wear Seat belts while driving.  Please note: You were cared for by a hospitalist during your hospital stay. Once you are discharged, your primary care physician will handle any further medical issues. Please note that NO REFILLS for any discharge medications will be authorized once you are discharged, as it is imperative that you return to your primary care physician (or establish a relationship with a primary care  physician if you do not have one) for your post hospital discharge needs so that they can reassess your need for medications and monitor your lab values.  Total Time spent coordinating discharge including counseling, education and face to face time equals 35 minutes.  SignedOren Binet 06/13/2020 2:56 PM

## 2020-06-14 DIAGNOSIS — I5032 Chronic diastolic (congestive) heart failure: Secondary | ICD-10-CM | POA: Diagnosis not present

## 2020-06-14 DIAGNOSIS — O071 Delayed or excessive hemorrhage following failed attempted termination of pregnancy: Secondary | ICD-10-CM | POA: Diagnosis not present

## 2020-06-23 DIAGNOSIS — G20 Parkinson's disease: Secondary | ICD-10-CM | POA: Diagnosis not present

## 2020-06-23 DIAGNOSIS — I129 Hypertensive chronic kidney disease with stage 1 through stage 4 chronic kidney disease, or unspecified chronic kidney disease: Secondary | ICD-10-CM | POA: Diagnosis not present

## 2020-06-23 DIAGNOSIS — E7800 Pure hypercholesterolemia, unspecified: Secondary | ICD-10-CM | POA: Diagnosis not present

## 2020-06-23 DIAGNOSIS — K219 Gastro-esophageal reflux disease without esophagitis: Secondary | ICD-10-CM | POA: Diagnosis not present

## 2020-06-23 DIAGNOSIS — M1991 Primary osteoarthritis, unspecified site: Secondary | ICD-10-CM | POA: Diagnosis not present

## 2020-06-23 DIAGNOSIS — R54 Age-related physical debility: Secondary | ICD-10-CM | POA: Diagnosis not present

## 2020-06-23 DIAGNOSIS — N183 Chronic kidney disease, stage 3 unspecified: Secondary | ICD-10-CM | POA: Diagnosis not present

## 2020-06-23 DIAGNOSIS — M47816 Spondylosis without myelopathy or radiculopathy, lumbar region: Secondary | ICD-10-CM | POA: Diagnosis not present

## 2020-06-23 DIAGNOSIS — H409 Unspecified glaucoma: Secondary | ICD-10-CM | POA: Diagnosis not present

## 2020-06-23 DIAGNOSIS — I5032 Chronic diastolic (congestive) heart failure: Secondary | ICD-10-CM | POA: Diagnosis not present

## 2020-06-23 DIAGNOSIS — I480 Paroxysmal atrial fibrillation: Secondary | ICD-10-CM | POA: Diagnosis not present

## 2020-06-24 DIAGNOSIS — Z9181 History of falling: Secondary | ICD-10-CM | POA: Diagnosis not present

## 2020-06-24 DIAGNOSIS — I130 Hypertensive heart and chronic kidney disease with heart failure and stage 1 through stage 4 chronic kidney disease, or unspecified chronic kidney disease: Secondary | ICD-10-CM | POA: Diagnosis not present

## 2020-06-24 DIAGNOSIS — K219 Gastro-esophageal reflux disease without esophagitis: Secondary | ICD-10-CM | POA: Diagnosis not present

## 2020-06-24 DIAGNOSIS — U071 COVID-19: Secondary | ICD-10-CM | POA: Diagnosis not present

## 2020-06-24 DIAGNOSIS — I480 Paroxysmal atrial fibrillation: Secondary | ICD-10-CM | POA: Diagnosis not present

## 2020-06-24 DIAGNOSIS — G629 Polyneuropathy, unspecified: Secondary | ICD-10-CM | POA: Diagnosis not present

## 2020-06-24 DIAGNOSIS — K5730 Diverticulosis of large intestine without perforation or abscess without bleeding: Secondary | ICD-10-CM | POA: Diagnosis not present

## 2020-06-24 DIAGNOSIS — N390 Urinary tract infection, site not specified: Secondary | ICD-10-CM | POA: Diagnosis not present

## 2020-06-24 DIAGNOSIS — G20 Parkinson's disease: Secondary | ICD-10-CM | POA: Diagnosis not present

## 2020-06-24 DIAGNOSIS — M1991 Primary osteoarthritis, unspecified site: Secondary | ICD-10-CM | POA: Diagnosis not present

## 2020-06-24 DIAGNOSIS — E785 Hyperlipidemia, unspecified: Secondary | ICD-10-CM | POA: Diagnosis not present

## 2020-06-24 DIAGNOSIS — K56699 Other intestinal obstruction unspecified as to partial versus complete obstruction: Secondary | ICD-10-CM | POA: Diagnosis not present

## 2020-06-24 DIAGNOSIS — I5032 Chronic diastolic (congestive) heart failure: Secondary | ICD-10-CM | POA: Diagnosis not present

## 2020-06-24 DIAGNOSIS — M4620 Osteomyelitis of vertebra, site unspecified: Secondary | ICD-10-CM | POA: Diagnosis not present

## 2020-06-24 DIAGNOSIS — M4645 Discitis, unspecified, thoracolumbar region: Secondary | ICD-10-CM | POA: Diagnosis not present

## 2020-06-24 DIAGNOSIS — G2581 Restless legs syndrome: Secondary | ICD-10-CM | POA: Diagnosis not present

## 2020-06-24 DIAGNOSIS — M419 Scoliosis, unspecified: Secondary | ICD-10-CM | POA: Diagnosis not present

## 2020-06-24 DIAGNOSIS — M48061 Spinal stenosis, lumbar region without neurogenic claudication: Secondary | ICD-10-CM | POA: Diagnosis not present

## 2020-06-24 DIAGNOSIS — H409 Unspecified glaucoma: Secondary | ICD-10-CM | POA: Diagnosis not present

## 2020-06-24 DIAGNOSIS — N1831 Chronic kidney disease, stage 3a: Secondary | ICD-10-CM | POA: Diagnosis not present

## 2020-06-24 DIAGNOSIS — M21379 Foot drop, unspecified foot: Secondary | ICD-10-CM | POA: Diagnosis not present

## 2020-06-24 DIAGNOSIS — G9340 Encephalopathy, unspecified: Secondary | ICD-10-CM | POA: Diagnosis not present

## 2020-06-24 DIAGNOSIS — G4733 Obstructive sleep apnea (adult) (pediatric): Secondary | ICD-10-CM | POA: Diagnosis not present

## 2020-07-03 DIAGNOSIS — M5417 Radiculopathy, lumbosacral region: Secondary | ICD-10-CM | POA: Diagnosis not present

## 2020-07-03 DIAGNOSIS — G5601 Carpal tunnel syndrome, right upper limb: Secondary | ICD-10-CM | POA: Diagnosis not present

## 2020-07-03 DIAGNOSIS — R2689 Other abnormalities of gait and mobility: Secondary | ICD-10-CM | POA: Diagnosis not present

## 2020-07-03 DIAGNOSIS — G3184 Mild cognitive impairment, so stated: Secondary | ICD-10-CM | POA: Diagnosis not present

## 2020-07-03 DIAGNOSIS — G20 Parkinson's disease: Secondary | ICD-10-CM | POA: Diagnosis not present

## 2020-07-03 DIAGNOSIS — G603 Idiopathic progressive neuropathy: Secondary | ICD-10-CM | POA: Diagnosis not present

## 2020-07-07 DIAGNOSIS — Z1231 Encounter for screening mammogram for malignant neoplasm of breast: Secondary | ICD-10-CM | POA: Diagnosis not present

## 2020-07-25 DIAGNOSIS — K219 Gastro-esophageal reflux disease without esophagitis: Secondary | ICD-10-CM | POA: Diagnosis not present

## 2020-07-25 DIAGNOSIS — M47816 Spondylosis without myelopathy or radiculopathy, lumbar region: Secondary | ICD-10-CM | POA: Diagnosis not present

## 2020-07-25 DIAGNOSIS — H409 Unspecified glaucoma: Secondary | ICD-10-CM | POA: Diagnosis not present

## 2020-07-25 DIAGNOSIS — I5032 Chronic diastolic (congestive) heart failure: Secondary | ICD-10-CM | POA: Diagnosis not present

## 2020-07-25 DIAGNOSIS — I129 Hypertensive chronic kidney disease with stage 1 through stage 4 chronic kidney disease, or unspecified chronic kidney disease: Secondary | ICD-10-CM | POA: Diagnosis not present

## 2020-07-25 DIAGNOSIS — N183 Chronic kidney disease, stage 3 unspecified: Secondary | ICD-10-CM | POA: Diagnosis not present

## 2020-07-25 DIAGNOSIS — I480 Paroxysmal atrial fibrillation: Secondary | ICD-10-CM | POA: Diagnosis not present

## 2020-07-25 DIAGNOSIS — G20 Parkinson's disease: Secondary | ICD-10-CM | POA: Diagnosis not present

## 2020-07-25 DIAGNOSIS — E7800 Pure hypercholesterolemia, unspecified: Secondary | ICD-10-CM | POA: Diagnosis not present

## 2020-07-25 DIAGNOSIS — R54 Age-related physical debility: Secondary | ICD-10-CM | POA: Diagnosis not present

## 2020-07-25 DIAGNOSIS — M1991 Primary osteoarthritis, unspecified site: Secondary | ICD-10-CM | POA: Diagnosis not present

## 2020-07-31 NOTE — Progress Notes (Deleted)
Telehealth Visit      Date:  07/31/2020   ID:  [name], DOB 09/06/1940, MRN [id number]  PCP:  [name], MD  Cardiologist:   Jenkins Rouge, MD  Electrophysiologist:  None   Chief Complaint:  Follow up  History of Present Illness:    80 y.o.  has a history of HTN, OSA with intolerance to CPAP and oral appliance, HLD, carotid disease, PAF - on Flecainide and on prior anticoagulation with Coumadin and then Xarelto (stopped due to falls), and progressive Parkinson's/PSP disorder. Remote cath in 2002 with normal coronaries.  Last Myoview in 2015.   Admitted 6/29-7/14/19 for discitis/osteomyelitis with concerns of abscess versus old hematoma. S/p total facetectomy, revision of all construct and new fusion construct of T9-L3 by neurosurgery 7/8. ID recommended abx until 07/2018.  Hospitalized with COVID 51/76/16 metabolic encephalopathy CT negative ? UTI as well  Seen in ED again 06/09/20 Rx UTI antibiotics hydrated   ***   Past Medical History:  Diagnosis Date  . Allergic rhinitis   . Arthritis    "all over"  . CHF (congestive heart failure) ([place])   . Chronic back pain   . DJD (degenerative joint disease)   . 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 GERD (gastroesophageal reflux disease)   . Hyperlipidemia   . Hypertension   . Lumbar stenosis   . OSA (obstructive sleep apnea)    "couldn't handle CPAP; use mouth guard some; not all the time" (01/06/2014)  . PAF (paroxysmal atrial fibrillation) ([place])   . Scoliosis    with radiculopathy L2-S1 with prior surgery  . Small bowel obstruction ([facility])    versus ileus after last bck surgery  . Spondylosis    Past Surgical History:  Procedure Laterality Date  . BACK SURGERY    . CATARACT EXTRACTION W/ INTRAOCULAR LENS  IMPLANT, BILATERAL  2011  . CHOLECYSTECTOMY N/A 07/07/2018   Procedure: LAPAROSCOPIC CHOLECYSTECTOMY;  Surgeon: Erroll Luna, MD;  Location: [place] [facility];  Service: General;  Laterality: N/A;  . COLONOSCOPY  12/09/2004  .  LIPOMA EXCISION  1980's   "fatty tumors"  . [place] SURGERY  02/2009   "ruptured disc"  . NASAL SEPTUM SURGERY  80's  . POSTERIOR LUMBAR FUSION  06/2010; 10/2011   "placed screws, rods, spacers both times"  . POSTERIOR LUMBAR FUSION 4 LEVEL N/A 12/13/2018   Procedure: THORACIC NINE -LUMBAR THREE POSTERIOR INSTRUMENTATION FUSION;  Surgeon: Judith Part, MD;  Location: [facility];  Service: Neurosurgery;  Laterality: N/A;  . REPAIR DURAL / CSF LEAK  02/2009  . THORACIC LAMINECTOMY FOR EPIDURAL ABSCESS N/A 09/28/2018   Procedure: THORACIC NINE-THORACIC TEN, THORACIC TEN-THORACIC ELEVEN, THORACIC ELEVEN-THORACIC TWELVE LAMINECTOMIES FOR EPIDURAL ABSCESS;  Surgeon: Judith Part, MD;  Location: [facility];  Service: Neurosurgery;  Laterality: N/A;  . TOTAL ABDOMINAL HYSTERECTOMY  02/1993     No outpatient medications have been marked as taking for the 08/06/20 encounter (Appointment) with Josue Hector, MD.     Allergies:   Lyrica [pregabalin], Zocor [simvastatin], and Relafen [nabumetone]   Social History   Tobacco Use  . Smoking status: Never Smoker  . Smokeless tobacco: Never Used  Vaping Use  . Vaping Use: Never used  Substance Use Topics  . Alcohol use: No    Alcohol/week: 0.0 standard drinks  . Drug use: No     Family Hx: The patient's family history includes Allergies in her sister; Arthritis in her mother; Asthma in her sister; Heart attack in her father;  Hypertension in her sister; Lung cancer in her sister.  ROS:   Please see the history of present illness.   All other systems reviewed are negative.    Objective:    Vital Signs:  There were no vitals taken for this visit.   Wt Readings from Last 3 Encounters:  06/05/20 89 kg  03/03/20 90.3 kg  02/14/20 90.3 kg    Affect appropriate Elderly female  HEENT: normal Neck supple with no adenopathy JVP normal no bruits no thyromegaly Lungs clear with no wheezing and good diaphragmatic motion Heart:  S1/S2 no  murmur, no rub, gallop or click PMI normal Abdomen: benighn, BS positve, no tenderness, no AAA no bruit.  No HSM or HJR Distal pulses intact with no bruits No edema Neuro *** Skin warm and dry  ECG:  06/10/20 SR rate 74 isolated PVC non specific ST changes   Labs/Other Tests and Data Reviewed:    Lab Results  Component Value Date   WBC 8.9 06/09/2020   HGB 13.6 06/09/2020   HCT 44.7 06/09/2020   PLT 178 06/09/2020   GLUCOSE 100 (H) 06/09/2020   CHOL  12/22/2006    183        ATP III CLASSIFICATION:  <200     mg/dL   Desirable  200-239  mg/dL   Borderline High  >=240    mg/dL   High   TRIG 174 (H) 12/22/2006   HDL 47 12/22/2006   LDLCALC (H) 12/22/2006    101        Total Cholesterol/HDL:CHD Risk Coronary Heart Disease Risk Table                     Men   Women  1/2 Average Risk   3.4   3.3   ALT 10 06/07/2020   AST 20 06/07/2020   NA 139 06/09/2020   K 4.1 06/09/2020   CL 103 06/09/2020   CREATININE 1.14 (H) 06/09/2020   BUN 25 (H) 06/09/2020   CO2 24 06/09/2020   TSH 1.298 12/25/2019   INR 1.9 (A) 07/06/2019   HGBA1C 6.0 (H) 02/27/2013     BNP (last 3 results) Recent Labs    12/25/19 2205  BNP 43.7    ProBNP (last 3 results) No results for input(s): PROBNP in the last 8760 hours.   ASSESSMENT & PLAN:    1. PAF - no reports of palpitations - last EKG from 06/10/20 showed that she was in sinus. She is no longer on Xarelto.   2. Prior anticoagulation - stopped due to falls - I agree with this.   3. OSA - intolerance to CPAP and oral appliance discuss with primary / ENT Inspiris device   4. Parkinson's/PSP disorder - followed by neurology - causes greatest issue with immobility  and falls   5. Carotid disease - last study in 2017 - may need to consider updating - had less than 50% narrowing at that time.   7. HLD - she is on statin - labs with primary   8. COVID:  Vaccinated admitted 12/29 Rx steroids and remdesivir    Tests Ordered: No orders of  the defined types were placed in this encounter.   Medication Changes: No orders of the defined types were placed in this encounter.   Disposition:  FU 1 year    Patient is agreeable to this plan and will call if any problems develop in the interim.   Signed, Jenkins Rouge, MD  07/31/2020 12:21 PM    [facility]

## 2020-08-05 DIAGNOSIS — R8290 Unspecified abnormal findings in urine: Secondary | ICD-10-CM | POA: Diagnosis not present

## 2020-08-05 DIAGNOSIS — G479 Sleep disorder, unspecified: Secondary | ICD-10-CM | POA: Diagnosis not present

## 2020-08-05 DIAGNOSIS — B349 Viral infection, unspecified: Secondary | ICD-10-CM | POA: Diagnosis not present

## 2020-08-06 ENCOUNTER — Ambulatory Visit: Payer: HMO | Admitting: Cardiovascular Disease

## 2020-08-12 NOTE — Progress Notes (Signed)
Virtual Visit Via Video   The purpose of this virtual visit is to provide medical care while limiting exposure to the novel coronavirus.    Consent was obtained for video visit:  Yes.   Answered questions that patient had about telehealth interaction:  Yes.   I discussed the limitations, risks, security and privacy concerns of performing an evaluation and management service by telemedicine. I also discussed with the patient that there may be a patient responsible charge related to this service. The patient expressed understanding and agreed to proceed.  Pt location: Home Physician Location: office Name of referring provider:  [name], MD I connected with [name] at patients initiation/request on 08/14/2020 at  3:00 PM EST by video enabled telemedicine application and verified that I am speaking with the correct person using two identifiers. Pt MRN:  [id number] Pt DOB:  05-15-1941 Video Participants:  [name];  Daughter [name] Assessment/Plan:   1.  PSP  -Patient understands differences between PSP and Parkinson's disease.  Understands that there is an increased risk of falls, aspiration as subsequent morbidity and mortality from this disease compared to Parkinson's disease.  -Continue carbidopa/levodopa 25/100, 2 tablets 3 times per day.  We previously did levodopa challenge test and found levodopa only mildly beneficial.  However, the patient thinks it is helpful so we have decided to continue it. 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  -Recommend discontinuation of rasagiline.  This will not help and she has been hospitalized for confusion.  This medication can contribute to that, although certainly the fact that she had Covid could also have been a contributing factor.  They report that Dr. Trula Ore just stopped this.  -Discussed with the patient that I  really need her to have 1 neurologist for her PSP.  I talked to her last visit about this, and they told me that she was just seeing Dr. Trula Ore for pain management, but then she ended up starting rasagiline December 18.  10 days later, she was in the hospital with Covid and encephalopathy.  I certainly have no objection if they would like to just see Dr. Trula Ore as the primary neurologist, as I certainly do not do pain management.  2.  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  She had been following infectious disease.  This has now resolved.  3.  Probable peripheral neuropathy  -Patient follows with Dr. Trula Ore for pain management.  4.  Diarrhea  -told her to f/u pcp.  I do not see any medications that should cause this from my standpoint and certainly PSP should not.  5.  Insomnia  -Tried melatonin without relief.  I think that the primary issue is that her husband recently passed away and she is worried about her living situation (sounds like family unable to take care of her and she may be headed for snf).  Primary care is having her take over-the-counter GABA and just start that last night.  Subjective:   [name] was seen today in follow up for PSP.  My previous records were reviewed prior to todays visit as well as outside records available to me.  Discussed last visit with the patient that I wanted her to stop walking altogether due to the number of falls.  Recommended an electric wheelchair and did a mobility evaluation.  The  report today that she never got the chair and she changed insurances.  She states that the other insurance company had approved it.  She saw Dr. Trula Ore and he placed her on rasagiline December 18.  She was admitted to the hospital 10 days later for Covid.  Those notes are reviewed.  Patient had metabolic encephalopathy during the hospitalization.   Daughter states that he put on the rasagaline for the confusion. Pt went to  hospital again last night for fall in kitchen.  States that she had been eating in the chair but felt better and went to the kitchen to eat and fell out of the chair and hit head.  No LOC.  CT brain neg.  Husband died since last visit from complications from Covid.  They state that this may change her living situation.  Trouble sleeping [name], MD added GABA last night.    Current prescribed movement disorder medications: Carbidopa/levodopa 25/100, 2 tablets 3 times a day    PREVIOUS MEDICATIONS: Pramipexole; clonazepam (did not really help at low-dose and we did not ran to raise the dose)  ALLERGIES:   Allergies  Allergen Reactions  . Lyrica [Pregabalin] Other (See Comments)    Felt loopy  . Zocor [Simvastatin] Other (See Comments)    Myalgias  . Relafen [Nabumetone] Rash    CURRENT MEDICATIONS:  Outpatient Encounter Medications as of 08/14/2020  Medication Sig  . acetaminophen (TYLENOL 8 HOUR) 650 MG CR tablet Take 1 tablet (650 mg total) by mouth every 8 (eight) hours as needed for pain.  . carbidopa-levodopa (SINEMET IR) 25-100 MG tablet Take 2 tablets by mouth 3 (three) times daily.  . Cholecalciferol 50 MCG (2000 UT) CAPS Take 4,000 Units by mouth daily.  . DULoxetine (CYMBALTA) 60 MG capsule Take 60 mg by mouth 2 (two) times daily with a meal.  . flecainide (TAMBOCOR) 50 MG tablet TAKE 1 TABLET(50 MG) BY MOUTH TWICE DAILY (Patient taking differently: Take 50 mg by mouth 2 (two) times daily.)  . furosemide (LASIX) 20 MG tablet TAKE 1 TABLET(20 MG) BY MOUTH DAILY (Patient taking differently: Take 20 mg by mouth daily.)  . gabapentin (NEURONTIN) 300 MG capsule Take 300-600 mg by mouth See admin instructions. Take one capsule (300 mg) by mouth daily with lunch and supper, and take two capsules (600 mg) at bedtime.  . Menthol, Topical Analgesic, (BIOFREEZE EX) Apply 1 application topically daily as needed (pain).   . metoprolol succinate (TOPROL-XL) 25 MG 24 hr tablet Take  12.5 mg by mouth daily.  . NON FORMULARY Gaba 750mg  take 1 tab once a day  . pantoprazole (PROTONIX) 40 MG tablet Take 40 mg by mouth daily.   . polyethylene glycol (MIRALAX / GLYCOLAX) packet Take 17 g by mouth daily. (Patient taking differently: Take 17 g by mouth daily as needed for mild constipation.)  . potassium chloride SA (K-DUR) 20 MEQ tablet Take 1 tablet (20 mEq total) by mouth daily.  . pravastatin (PRAVACHOL) 40 MG tablet Take 40 mg by mouth daily with lunch.  . TRAVATAN Z 0.004 % SOLN ophthalmic solution Place 1 drop into both eyes at bedtime.   . [DISCONTINUED] benzonatate (TESSALON PERLES) 100 MG capsule Take 1 capsule (100 mg total) by mouth every 6 (six) hours as needed for cough. (Patient not taking: Reported on 08/14/2020)  . [DISCONTINUED] cephALEXin (KEFLEX) 500 MG capsule Take 1 capsule (500 mg total) by mouth 4 (four) times daily. (Patient not taking: Reported on 08/14/2020)  . [  DISCONTINUED] Cranberry-Vitamin C (AZO CRANBERRY URINARY TRACT PO) Take 2 tablets by mouth as needed (urinary). (Patient not taking: Reported on 08/14/2020)  . [DISCONTINUED] doxycycline (VIBRAMYCIN) 100 MG capsule Take 1 capsule (100 mg total) by mouth 2 (two) times daily. (Patient not taking: Reported on 08/14/2020)  . [DISCONTINUED] Gabapentin & Lidocaine-Menthol (SMARTRX GABA CO) Take 750 mg by mouth daily.  . [DISCONTINUED] Melatonin 10 MG TABS Take 1 tablet by mouth at bedtime. (Patient not taking: Reported on 08/14/2020)  . [DISCONTINUED] predniSONE (DELTASONE) 10 MG tablet Take 40 mg daily for 1 day, 30 mg daily for 1 day, 20 mg daily for 1 days,10 mg daily for 1 day, then stop (Patient not taking: Reported on 08/14/2020)  . [DISCONTINUED] rasagiline (AZILECT) 1 MG TABS tablet Take 1 mg by mouth daily. (Patient not taking: Reported on 08/14/2020)  . [DISCONTINUED] Vibegron (GEMTESA) 75 MG TABS Take 75 mg by mouth daily. For bladder control issues. (Patient not taking: Reported on 08/14/2020)   No  facility-administered encounter medications on file as of 08/14/2020.    Objective:   PHYSICAL EXAMINATION:    VITALS:   Vitals:   08/14/20 1351  Weight: 175 lb (79.4 kg)  Height: 5\' 6"  (1.676 m)    GEN:  The patient appears stated age and is in NAD.  Neurological examination:  Orientation: The patient is alert and oriented x3. Cranial nerves: There is good facial symmetry.  The extraocular muscles are intact (reports diplopia).  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  The speech is fluent and clear. Soft palate rises symmetrically and there is no tongue deviation. Hearing is intact to conversational tone. Motor: Strength is at least antigravity x 4.   Shoulder shrug is equal and symmetric.  There is no pronator drift.  Movement examination: Tone: unable Abnormal movements: she has facial dystonia and facial dyskinesia Coordination:  There is mild decremation with RAM's in the upper extremities. Gait and Station: Not tested.   I have reviewed and interpreted the following labs independently    Chemistry      Component Value Date/Time   NA 139 06/09/2020 1038   K 4.1 06/09/2020 1038   CL 103 06/09/2020 1038   CO2 24 06/09/2020 1038   BUN 25 (H) 06/09/2020 1038   CREATININE 1.14 (H) 06/09/2020 1038   CREATININE 0.93 01/11/2019 1547      Component Value Date/Time   CALCIUM 9.2 06/09/2020 1038   ALKPHOS 94 06/07/2020 0359   AST 20 06/07/2020 0359   ALT 10 06/07/2020 0359   BILITOT 0.3 06/07/2020 0359       Lab Results  Component Value Date   WBC 8.9 06/09/2020   HGB 13.6 06/09/2020   HCT 44.7 06/09/2020   MCV 88.7 06/09/2020   PLT 178 06/09/2020    Lab Results  Component Value Date   TSH 1.298 12/25/2019    Follow up Instructions      -I discussed the assessment and treatment plan with the patient. The patient was provided an opportunity to ask questions and all were answered. The patient agreed with the plan and demonstrated an understanding of the instructions.    The patient was advised to call back or seek an in-person evaluation if the symptoms worsen or if the condition fails to improve as anticipated.    Total time spent on today's visit was 30 minutes, including both face-to-face time and nonface-to-face time.  Time included that spent on review of records (prior notes available to me/labs/imaging if pertinent), discussing  treatment and goals, answering patient's questions and coordinating care.   Alonza Bogus, DO   Cc:  [name], MD

## 2020-08-13 ENCOUNTER — Emergency Department (HOSPITAL_BASED_OUTPATIENT_CLINIC_OR_DEPARTMENT_OTHER)
Admission: EM | Admit: 2020-08-13 | Discharge: 2020-08-13 | Disposition: A | Discharge status: Home / Self Care | Payer: HMO | Attending: Emergency Medicine | Admitting: Emergency Medicine

## 2020-08-13 ENCOUNTER — Emergency Department (HOSPITAL_BASED_OUTPATIENT_CLINIC_OR_DEPARTMENT_OTHER): Payer: HMO

## 2020-08-13 ENCOUNTER — Encounter (HOSPITAL_BASED_OUTPATIENT_CLINIC_OR_DEPARTMENT_OTHER): Payer: Self-pay | Admitting: *Deleted

## 2020-08-13 ENCOUNTER — Other Ambulatory Visit: Payer: Self-pay

## 2020-08-13 DIAGNOSIS — W07XXXA Fall from chair, initial encounter: Secondary | ICD-10-CM | POA: Insufficient documentation

## 2020-08-13 DIAGNOSIS — Z79899 Other long term (current) drug therapy: Secondary | ICD-10-CM | POA: Diagnosis not present

## 2020-08-13 DIAGNOSIS — N1831 Chronic kidney disease, stage 3a: Secondary | ICD-10-CM | POA: Diagnosis not present

## 2020-08-13 DIAGNOSIS — I130 Hypertensive heart and chronic kidney disease with heart failure and stage 1 through stage 4 chronic kidney disease, or unspecified chronic kidney disease: Secondary | ICD-10-CM | POA: Diagnosis not present

## 2020-08-13 DIAGNOSIS — M79661 Pain in right lower leg: Secondary | ICD-10-CM | POA: Insufficient documentation

## 2020-08-13 DIAGNOSIS — S0990XA Unspecified injury of head, initial encounter: Secondary | ICD-10-CM | POA: Insufficient documentation

## 2020-08-13 DIAGNOSIS — I5032 Chronic diastolic (congestive) heart failure: Secondary | ICD-10-CM | POA: Diagnosis not present

## 2020-08-13 DIAGNOSIS — R519 Headache, unspecified: Secondary | ICD-10-CM | POA: Diagnosis not present

## 2020-08-13 DIAGNOSIS — M47812 Spondylosis without myelopathy or radiculopathy, cervical region: Secondary | ICD-10-CM | POA: Diagnosis not present

## 2020-08-13 DIAGNOSIS — G20 Parkinson's disease: Secondary | ICD-10-CM | POA: Insufficient documentation

## 2020-08-13 DIAGNOSIS — W19XXXA Unspecified fall, initial encounter: Secondary | ICD-10-CM

## 2020-08-13 MED ORDER — DOXYCYCLINE HYCLATE 100 MG PO CAPS: 100.0000 mg | ORAL_CAPSULE | Freq: Two times a day (BID) | ORAL | 0 refills | Status: DC

## 2020-08-13 NOTE — ED Notes (Signed)
Pt to CT. NAD. 

## 2020-08-13 NOTE — ED Notes (Signed)
Pt via wheelchair to room 10. Pt reports having a fall while sitting in chair. -blood thinners. Pt reports injury to head. No obvious signs of injury. -LOC. A&Ox4. Respirations regular/unlabored. NAD. Connected to BP, pulse ox. Stretcher low, wheel locked, call bell within reach. Family at bedside.

## 2020-08-13 NOTE — ED Triage Notes (Signed)
Pt fell 2 hours PTA. Reports that she leaned too far and fell out of her chair.  Pt did hit head, denies LOC. Denies blood thinner use. Pt has hx of parkinsons. No head abrasion, hematoma noted. Right shoulder and right hip pain since the fall.

## 2020-08-13 NOTE — ED Provider Notes (Signed)
[facility]ERGENC[facility] DEPT Provider Note   CSN: [id number] Arrival date & time: 08/13/20  2048     History Chief Complaint  Patient presents with  . Fall    [name] is a 80 y.o. female.  80yo F w/ PMH including progressive supranuclear palsy, left dropfoot, peripheral neuropathy, atrial fibrillation not on anticoagulation, OSA, hypertension, hyperlipidemia, CHF who presents with fall.  Approximately 2 hours prior to arrival, the patient was sitting in a chair and leaned too far and fell out of the chair onto her right side.  She hit the right side of her head but did not lose consciousness.  She also reports hitting her right shoulder, right hip, and right lower leg which are sore.  No vision changes, vomiting, neck/back pain, or other complaints.  Daughter notes that she is not ambulatory at baseline due to her peripheral neuropathy and PSP.  Pain is currently mild.  The history is provided by the patient and a relative.  Fall       Past Medical History:  Diagnosis Date  . Allergic rhinitis   . Arthritis    "all over"  . CHF (congestive heart failure) ([place])   . Chronic back pain   . DJD (degenerative joint disease)   . 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 GERD (gastroesophageal reflux disease)   . Hyperlipidemia   . Hypertension   . Lumbar stenosis   . OSA (obstructive sleep apnea)    "couldn't handle CPAP; use mouth guard some; not all the time" (01/06/2014)  . PAF (paroxysmal atrial fibrillation) ([place])   . Scoliosis    with radiculopathy L2-S1 with prior surgery  . Small bowel obstruction ([facility])    versus ileus after last bck surgery  . Spondylosis     Patient Active Problem List   Diagnosis Date Noted  . Encephalopathy due to COVID-19 virus 06/04/2020  . PSP (progressive supranuclear palsy) ([place]) 08/10/2019  . UTI (urinary tract infection) 12/06/2018  . Vertebral osteomyelitis ([place]) 12/05/2018  . Spinal stenosis 12/05/2018  . AKI (acute kidney  injury) ([place]) 12/05/2018  . Acute metabolic encephalopathy 74/25/9563  . Chronic diastolic (congestive) heart failure ([place]) 10/07/2018  . GERD (gastroesophageal reflux disease) 10/07/2018  . Abnormal glucose tolerance test 07/01/2018  . Colon, diverticulosis 07/01/2018  . 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 Glaucoma 07/01/2018  . Stage 3a chronic kidney disease ([place]) 07/01/2018  . Neuropathy 07/01/2018  . Pure hypercholesterolemia 07/01/2018  . Restless leg 07/01/2018  . Discitis of thoracolumbar region 07/01/2018  . Acute bronchitis due to infection 05/04/2017  . Cough 04/21/2016  . Preoperative cardiovascular examination 04/18/2015  . Parkinsonism ([place]) 05/15/2014  . Chronic anticoagulation -warfarin therapy 02/03/2014  . Atrial fibrillation with RVR ([place]) 01/31/2014  . Gait instability 11/16/2013  . Edema 03/03/2010  . ATRIAL FIBRILLATION 04/23/2008  . Hyperlipidemia 01/31/2008  . Obstructive sleep apnea 01/31/2008  . Unspecified glaucoma 01/30/2008  . Seasonal and perennial allergic rhinitis 01/30/2008  . Osteoarthritis 01/30/2008    Past Surgical History:  Procedure Laterality Date  . BACK SURGERY    . CATARACT EXTRACTION W/ INTRAOCULAR LENS  IMPLANT, BILATERAL  2011  . CHOLECYSTECTOMY N/A 07/07/2018   Procedure: LAPAROSCOPIC CHOLECYSTECTOMY;  Surgeon: Erroll Luna, MD;  Location: [facility];  Service: General;  Laterality: N/A;  . COLONOSCOPY  12/09/2004  . LIPOMA EXCISION  1980's   "fatty tumors"  . [place] SURGERY  02/2009   "ruptured disc"  . NASAL SEPTUM SURGERY  80's  . POSTERIOR LUMBAR FUSION  06/2010; 10/2011   "placed screws, rods, spacers both times"  . POSTERIOR LUMBAR FUSION 4 LEVEL N/A 12/13/2018   Procedure: THORACIC NINE -LUMBAR THREE POSTERIOR INSTRUMENTATION FUSION;  Surgeon: Judith Part, MD;  Location: [facility];  Service: Neurosurgery;  Laterality: N/A;  . REPAIR DURAL / CSF LEAK  02/2009  . THORACIC LAMINECTOMY FOR EPIDURAL ABSCESS N/A 09/28/2018   Procedure:  THORACIC NINE-THORACIC TEN, THORACIC TEN-THORACIC ELEVEN, THORACIC ELEVEN-THORACIC TWELVE LAMINECTOMIES FOR EPIDURAL ABSCESS;  Surgeon: Judith Part, MD;  Location: [facility];  Service: Neurosurgery;  Laterality: N/A;  . TOTAL ABDOMINAL HYSTERECTOMY  02/1993     OB History   No obstetric history on file.     Family History  Problem Relation Age of Onset  . Arthritis Mother   . Heart attack Father   . Hypertension Sister   . Lung cancer Sister   . Asthma Sister   . Allergies Sister     Social History   Tobacco Use  . Smoking status: Never Smoker  . Smokeless tobacco: Never Used  Vaping Use  . Vaping Use: Never used  Substance Use Topics  . Alcohol use: No    Alcohol/week: 0.0 standard drinks  . Drug use: No    Home Medications Prior to Admission medications   Medication Sig Start Date End Date Taking? Authorizing Provider  doxycycline (VIBRAMYCIN) 100 MG capsule Take 1 capsule (100 mg total) by mouth 2 (two) times daily. 08/13/20  Yes Rylann Munford, Wenda Overland, MD  acetaminophen (TYLENOL 8 HOUR) 650 MG CR tablet Take 1 tablet (650 mg total) by mouth every 8 (eight) hours as needed for pain. 12/17/19   Suzy Bouchard, PA-C  benzonatate (TESSALON PERLES) 100 MG capsule Take 1 capsule (100 mg total) by mouth every 6 (six) hours as needed for cough. 06/07/20 06/07/21  Ghimire, Henreitta Leber, MD  carbidopa-levodopa (SINEMET IR) 25-100 MG tablet Take 2 tablets by mouth 3 (three) times daily. 04/10/20   Tat, Eustace Quail, DO  cephALEXin (KEFLEX) 500 MG capsule Take 1 capsule (500 mg total) by mouth 4 (four) times daily. 06/09/20   Varney Biles, MD  Cholecalciferol 50 MCG (2000 UT) CAPS Take 4,000 Units by mouth daily.    [provider]  Cranberry-Vitamin C (AZO CRANBERRY URINARY TRACT PO) Take 2 tablets by mouth as needed (urinary).    [provider]  DULoxetine (CYMBALTA) 60 MG capsule Take 60 mg by mouth 2 (two) times daily with a meal. 10/25/19   [provider]   flecainide (TAMBOCOR) 50 MG tablet TAKE 1 TABLET(50 MG) BY MOUTH TWICE DAILY Patient taking differently: Take 50 mg by mouth 2 (two) times daily. 05/27/20   Josue Hector, MD  furosemide (LASIX) 20 MG tablet TAKE 1 TABLET(20 MG) BY MOUTH DAILY Patient taking differently: Take 20 mg by mouth daily. 04/28/20   Bhagat, Crista Luria, PA  gabapentin (NEURONTIN) 300 MG capsule Take 300-600 mg by mouth See admin instructions. Take one capsule (300 mg) by mouth daily with lunch and supper, and take two capsules (600 mg) at bedtime.    [provider]  Melatonin 10 MG TABS Take 1 tablet by mouth at bedtime.    [provider]  Menthol, Topical Analgesic, (BIOFREEZE EX) Apply 1 application topically daily as needed (pain).     [provider]  metoprolol succinate (TOPROL-XL) 25 MG 24 hr tablet Take 12.5 mg by mouth daily.    [provider]  pantoprazole (PROTONIX) 40 MG tablet Take 40  mg by mouth daily.     [provider]  polyethylene glycol (MIRALAX / GLYCOLAX) packet Take 17 g by mouth daily. Patient taking differently: Take 17 g by mouth daily as needed for mild constipation. 07/10/18   Raiford Noble Latif, DO  potassium chloride SA (K-DUR) 20 MEQ tablet Take 1 tablet (20 mEq total) by mouth daily. 01/01/19   Bhagat, Crista Luria, PA  pravastatin (PRAVACHOL) 40 MG tablet Take 40 mg by mouth daily with lunch.    [provider]  predniSONE (DELTASONE) 10 MG tablet Take 40 mg daily for 1 day, 30 mg daily for 1 day, 20 mg daily for 1 days,10 mg daily for 1 day, then stop 06/07/20   Ghimire, Henreitta Leber, MD  rasagiline (AZILECT) 1 MG TABS tablet Take 1 mg by mouth daily. 05/24/20   [provider]  TRAVATAN Z 0.004 % SOLN ophthalmic solution Place 1 drop into both eyes at bedtime.  01/21/15   [provider]  Vibegron (GEMTESA) 75 MG TABS Take 75 mg by mouth daily. For bladder control issues.    [provider]    Allergies     Lyrica [pregabalin], Zocor [simvastatin], and Relafen [nabumetone]  Review of Systems   Review of Systems All other systems reviewed and are negative except that which was mentioned in HPI  Physical Exam Updated Vital Signs BP (!) 166/70   Pulse 78   Temp 97.9 F (36.6 C) (Oral)   Resp 15   Ht 5\' 5"  (1.651 m)   Wt 79.4 kg   SpO2 99%   BMI 29.12 kg/m   Physical Exam Constitutional:      General: She is not in acute distress.    Appearance: Normal appearance.  HENT:     Head: Normocephalic and atraumatic.     Comments: No appreciable hematoma or bruising on scalp Eyes:     Extraocular Movements: Extraocular movements intact.     Conjunctiva/sclera: Conjunctivae normal.     Pupils: Pupils are equal, round, and reactive to light.  Cardiovascular:     Rate and Rhythm: Normal rate and regular rhythm.     Heart sounds: Normal heart sounds. No murmur heard.   Pulmonary:     Effort: Pulmonary effort is normal.     Breath sounds: Normal breath sounds.  Chest:     Chest wall: No tenderness.  Abdominal:     General: Abdomen is flat. Bowel sounds are normal. There is no distension.     Palpations: Abdomen is soft.     Tenderness: There is no abdominal tenderness.  Musculoskeletal:        General: No swelling or deformity.     Cervical back: Neck supple. No tenderness.     Right lower leg: No edema.     Left lower leg: No edema.     Comments: Mild tenderness in R lower leg, normal ROM at shoulders, elbows, hips, knees without pain  Skin:    General: Skin is warm and dry.  Neurological:     Mental Status: She is alert and oriented to person, place, and time.     Comments: Slow but fluent speech, 5/5 strength BUE  Psychiatric:        Mood and Affect: Mood normal.        Behavior: Behavior normal.     ED Results / Procedures / Treatments   Labs (all labs ordered are listed, but only abnormal results are displayed) Labs Reviewed - No data to  display  EKG None  Radiology DG Tibia/Fibula Right  Result Date: 08/13/2020 CLINICAL DATA:  Right lower extremity pain status post fall. EXAM: RIGHT TIBIA AND FIBULA - 2 VIEW COMPARISON:  None. FINDINGS: There is no evidence of fracture or other focal bone lesions. Soft tissues are unremarkable. IMPRESSION: Negative. Electronically Signed   By: Constance Holster M.D.   On: 08/13/2020 22:25   CT Head Wo Contrast  Result Date: 08/13/2020 CLINICAL DATA:  Fall 2 hours ago with headache and neck pain, initial encounter EXAM: CT HEAD WITHOUT CONTRAST CT CERVICAL SPINE WITHOUT CONTRAST TECHNIQUE: Multidetector CT imaging of the head and cervical spine was performed following the standard protocol without intravenous contrast. Multiplanar CT image reconstructions of the cervical spine were also generated. COMPARISON:  06/03/2020 FINDINGS: CT HEAD FINDINGS Brain: Mild atrophic changes and chronic white matter ischemic change is seen. Prior left basal ganglia lacunar infarct is noted as well. No acute hemorrhage or acute infarct is seen. Vascular: No hyperdense vessel or unexpected calcification. Skull: Normal. Negative for fracture or focal lesion. Sinuses/Orbits: Orbits and their contents are within normal limits. Mild mucosal changes are noted in the maxillary antra bilaterally. Other: None. CT CERVICAL SPINE FINDINGS Alignment: Within normal limits. Skull base and vertebrae: 7 cervical segments are well visualized. Vertebral body height is well maintained. Multilevel osteophytic changes are noted. Mild facet hypertrophic changes are seen. No acute fracture or acute facet abnormality is noted. No anterolisthesis is seen. Soft tissues and spinal canal: Surrounding soft tissue structures demonstrate diffuse carotid calcifications. No acute hematoma is noted. Thyroid is within normal limits. Upper chest: Lung apices are within normal limits. Other: None IMPRESSION: CT of the head: Chronic atrophic and ischemic  changes without acute intracranial abnormality. Mucosal thickening is noted within the maxillary antra bilaterally. CT of the cervical spine: Multilevel degenerative change without acute abnormality. Electronically Signed   By: Inez Catalina M.D.   On: 08/13/2020 22:30   CT Cervical Spine Wo Contrast  Result Date: 08/13/2020 CLINICAL DATA:  Fall 2 hours ago with headache and neck pain, initial encounter EXAM: CT HEAD WITHOUT CONTRAST CT CERVICAL SPINE WITHOUT CONTRAST TECHNIQUE: Multidetector CT imaging of the head and cervical spine was performed following the standard protocol without intravenous contrast. Multiplanar CT image reconstructions of the cervical spine were also generated. COMPARISON:  06/03/2020 FINDINGS: CT HEAD FINDINGS Brain: Mild atrophic changes and chronic white matter ischemic change is seen. Prior left basal ganglia lacunar infarct is noted as well. No acute hemorrhage or acute infarct is seen. Vascular: No hyperdense vessel or unexpected calcification. Skull: Normal. Negative for fracture or focal lesion. Sinuses/Orbits: Orbits and their contents are within normal limits. Mild mucosal changes are noted in the maxillary antra bilaterally. Other: None. CT CERVICAL SPINE FINDINGS Alignment: Within normal limits. Skull base and vertebrae: 7 cervical segments are well visualized. Vertebral body height is well maintained. Multilevel osteophytic changes are noted. Mild facet hypertrophic changes are seen. No acute fracture or acute facet abnormality is noted. No anterolisthesis is seen. Soft tissues and spinal canal: Surrounding soft tissue structures demonstrate diffuse carotid calcifications. No acute hematoma is noted. Thyroid is within normal limits. Upper chest: Lung apices are within normal limits. Other: None IMPRESSION: CT of the head: Chronic atrophic and ischemic changes without acute intracranial abnormality. Mucosal thickening is noted within the maxillary antra bilaterally. CT of the  cervical spine: Multilevel degenerative change without acute abnormality. Electronically Signed   By: Inez Catalina M.D.   On: 08/13/2020  22:30    Procedures Procedures   Medications Ordered in ED Medications - No data to display  ED Course  I have reviewed the triage vital signs and the nursing notes.  Pertinent imaging results that were available during my care of the patient were reviewed by me and considered in my medical decision making (see chart for details).    MDM Rules/Calculators/A&P                          Well appearing on exam. No hip or shoulder pain during ROM. Obtained CT head and c-spine, XR tib/fib which were negative acute.  Patient well-appearing and comfortable on reassessment.  I discussed supportive measures and reviewed return precautions regarding head injury with the patient and her daughter.  They voiced understanding. Final Clinical Impression(s) / ED Diagnoses Final diagnoses:  Fall, initial encounter  Closed head injury, initial encounter    Rx / DC Orders ED Discharge Orders         Ordered    doxycycline (VIBRAMYCIN) 100 MG capsule  2 times daily        08/13/20 2222           Herman Fiero, Wenda Overland, MD 08/13/20 [phone]

## 2020-08-14 ENCOUNTER — Encounter: Payer: Self-pay | Admitting: Neurology

## 2020-08-14 ENCOUNTER — Telehealth (INDEPENDENT_AMBULATORY_CARE_PROVIDER_SITE_OTHER): Payer: HMO | Admitting: Neurology

## 2020-08-14 ENCOUNTER — Other Ambulatory Visit: Payer: Self-pay

## 2020-08-14 VITALS — Ht 66.0 in | Wt 175.0 lb

## 2020-08-14 DIAGNOSIS — G231 Progressive supranuclear ophthalmoplegia [Steele-Richardson-Olszewski]: Secondary | ICD-10-CM

## 2020-08-14 DIAGNOSIS — G4700 Insomnia, unspecified: Secondary | ICD-10-CM

## 2020-08-16 ENCOUNTER — Emergency Department (HOSPITAL_BASED_OUTPATIENT_CLINIC_OR_DEPARTMENT_OTHER): Payer: HMO

## 2020-08-16 ENCOUNTER — Other Ambulatory Visit: Payer: Self-pay

## 2020-08-16 ENCOUNTER — Emergency Department (HOSPITAL_BASED_OUTPATIENT_CLINIC_OR_DEPARTMENT_OTHER)
Admission: EM | Admit: 2020-08-16 | Discharge: 2020-08-16 | Disposition: A | Discharge status: Home / Self Care | Payer: HMO | Attending: Emergency Medicine | Admitting: Emergency Medicine

## 2020-08-16 DIAGNOSIS — Z8616 Personal history of COVID-19: Secondary | ICD-10-CM | POA: Diagnosis not present

## 2020-08-16 DIAGNOSIS — I5032 Chronic diastolic (congestive) heart failure: Secondary | ICD-10-CM | POA: Insufficient documentation

## 2020-08-16 DIAGNOSIS — W19XXXD Unspecified fall, subsequent encounter: Secondary | ICD-10-CM

## 2020-08-16 DIAGNOSIS — Z79899 Other long term (current) drug therapy: Secondary | ICD-10-CM | POA: Insufficient documentation

## 2020-08-16 DIAGNOSIS — R519 Headache, unspecified: Secondary | ICD-10-CM | POA: Diagnosis not present

## 2020-08-16 DIAGNOSIS — W01198D Fall on same level from slipping, tripping and stumbling with subsequent striking against other object, subsequent encounter: Secondary | ICD-10-CM | POA: Insufficient documentation

## 2020-08-16 DIAGNOSIS — N1831 Chronic kidney disease, stage 3a: Secondary | ICD-10-CM | POA: Diagnosis not present

## 2020-08-16 DIAGNOSIS — I130 Hypertensive heart and chronic kidney disease with heart failure and stage 1 through stage 4 chronic kidney disease, or unspecified chronic kidney disease: Secondary | ICD-10-CM | POA: Diagnosis not present

## 2020-08-16 DIAGNOSIS — I10 Essential (primary) hypertension: Secondary | ICD-10-CM | POA: Diagnosis not present

## 2020-08-16 DIAGNOSIS — R531 Weakness: Secondary | ICD-10-CM | POA: Insufficient documentation

## 2020-08-16 LAB — CBC WITH DIFFERENTIAL/PLATELET
Abs Immature Granulocytes: 0.06 10*3/uL (ref 0.00–0.07)
Basophils Absolute: 0.1 10*3/uL (ref 0.0–0.1)
Basophils Relative: 1 %
Eosinophils Absolute: 0.1 10*3/uL (ref 0.0–0.5)
Eosinophils Relative: 1 %
HCT: 43.7 % (ref 36.0–46.0)
Hemoglobin: 13.5 g/dL (ref 12.0–15.0)
Immature Granulocytes: 1 %
Lymphocytes Relative: 14 %
Lymphs Abs: 1.1 10*3/uL (ref 0.7–4.0)
MCH: 28.5 pg (ref 26.0–34.0)
MCHC: 30.9 g/dL (ref 30.0–36.0)
MCV: 92.4 fL (ref 80.0–100.0)
Monocytes Absolute: 0.6 10*3/uL (ref 0.1–1.0)
Monocytes Relative: 7 %
Neutro Abs: 6 10*3/uL (ref 1.7–7.7)
Neutrophils Relative %: 76 %
Platelets: 190 10*3/uL (ref 150–400)
RBC: 4.73 MIL/uL (ref 3.87–5.11)
RDW: 15.1 % (ref 11.5–15.5)
WBC: 7.8 10*3/uL (ref 4.0–10.5)
nRBC: 0 % (ref 0.0–0.2)

## 2020-08-16 LAB — BASIC METABOLIC PANEL
Anion gap: 8 (ref 5–15)
BUN: 19 mg/dL (ref 8–23)
CO2: 31 mmol/L (ref 22–32)
Calcium: 9.5 mg/dL (ref 8.9–10.3)
Chloride: 101 mmol/L (ref 98–111)
Creatinine, Ser: 0.98 mg/dL (ref 0.44–1.00)
GFR, Estimated: 59 mL/min — ABNORMAL LOW (ref >60)
Glucose, Bld: 110 mg/dL — ABNORMAL HIGH (ref 70–99)
Potassium: 4.3 mmol/L (ref 3.5–5.1)
Sodium: 140 mmol/L (ref 135–145)

## 2020-08-16 LAB — URINALYSIS, ROUTINE W REFLEX MICROSCOPIC
Bilirubin Urine: NEGATIVE
Glucose, UA: NEGATIVE mg/dL
Hgb urine dipstick: NEGATIVE
Ketones, ur: NEGATIVE mg/dL
Leukocytes,Ua: NEGATIVE
Nitrite: NEGATIVE
Protein, ur: NEGATIVE mg/dL
Specific Gravity, Urine: 1.008 (ref 1.005–1.030)
pH: 5.5 (ref 5.0–8.0)

## 2020-08-16 MED ORDER — SODIUM CHLORIDE 0.9 % IV SOLN: Freq: Once | INTRAVENOUS | Status: DC

## 2020-08-16 NOTE — ED Notes (Addendum)
Pt fell on Wednesday while sitting at the table - was seen in the ED -  Pt c/o of pain on left and right side of her head since last night. Denies N/V/D Took tylenol ~1600 today  Daughter @ [facility] Pt hard stick - two attempts made - MD advised that not necessary at this time

## 2020-08-16 NOTE — ED Provider Notes (Signed)
[facility]ERG[facility] DEPT Provider Note   CSN: [id number] Arrival date & time: 08/16/20  1659     History Chief Complaint  Patient presents with  . Fall    Fall on Wednesday    [name] is a 80 y.o. female.  Pt presents to the ED today with a headache.  Pt has a hx of progressive supranuclear palsy and falls frequently.  She fell on 3/9 and was seen in the ED here.  CT head negative.  She did follow up with her neurologist (Dr. Carles Collet) on 3/10 and was recommended to continue carbidopa/levodopa.  She had already stopped rasagiline.  She has a hx of insomnia for which she takes melatonin.  She did not go to sleep until 0400.  Headache worsened after straining to urinate.  Headache did not get better after tylenol, so her daughter brought her back here for further eval.  Headache has improved now.  She took her normal 4 pm meds which includes gabapentin for leg pain.  Pt does not take her tramadol unless she is in severe pain because it makes her a little goofy.  No f/c.        Past Medical History:  Diagnosis Date  . Allergic rhinitis   . Arthritis    "all over"  . CHF (congestive heart failure) ([place])   . Chronic back pain   . DJD (degenerative joint disease)   . 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 GERD (gastroesophageal reflux disease)   . Hyperlipidemia   . Hypertension   . Lumbar stenosis   . OSA (obstructive sleep apnea)    "couldn't handle CPAP; use mouth guard some; not all the time" (01/06/2014)  . PAF (paroxysmal atrial fibrillation) ([place])   . Scoliosis    with radiculopathy L2-S1 with prior surgery  . Small bowel obstruction (H[facility])    versus ileus after last bck surgery  . Spondylosis     Patient Active Problem List   Diagnosis Date Noted  . Encephalopathy due to COVID-19 virus 06/04/2020  . PSP (progressive supranuclear palsy) ([place]) 08/10/2019  . UTI (urinary tract infection) 12/06/2018  . 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 Spinal  stenosis 12/05/2018  . AKI (acute kidney injury) ([place]) 12/05/2018  . Acute metabolic encephalopathy 01/60/1093  . Chronic diastolic (congestive) heart failure ([place]) 10/07/2018  . GERD (gastroesophageal reflux disease) 10/07/2018  . Abnormal glucose tolerance test 07/01/2018  . Colon, diverticulosis 07/01/2018  . 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 Glaucoma 07/01/2018  . Stage 3a chronic kidney disease ([place]) 07/01/2018  . Neuropathy 07/01/2018  . Pure hypercholesterolemia 07/01/2018  . Restless leg 07/01/2018  . Discitis of thoracolumbar region 07/01/2018  . Acute bronchitis due to infection 05/04/2017  . Cough 04/21/2016  . Preoperative cardiovascular examination 04/18/2015  . Parkinsonism ([place]) 05/15/2014  . Chronic anticoagulation -warfarin therapy 02/03/2014  . Atrial fibrillation with RVR ([place]) 01/31/2014  . Gait instability 11/16/2013  . Edema 03/03/2010  . ATRIAL FIBRILLATION 04/23/2008  . Hyperlipidemia 01/31/2008  . Obstructive sleep apnea 01/31/2008  . Unspecified glaucoma 01/30/2008  . Seasonal and perennial allergic rhinitis 01/30/2008  . Osteoarthritis 01/30/2008    Past Surgical History:  Procedure Laterality Date  . BACK SURGERY    . CATARACT EXTRACTION W/ INTRAOCULAR LENS  IMPLANT, BILATERAL  2011  . CHOLECYSTECTOMY N/A 07/07/2018   Procedure: LAPAROSCOPIC CHOLECYSTECTOMY;  Surgeon: Erroll Luna, MD;  Location: [facility];  Service: General;  Laterality: N/A;  . COLONOSCOPY  12/09/2004  . LIPOMA EXCISION  1980's   "  fatty tumors"  . [place] SURGERY  02/2009   "ruptured disc"  . NASAL SEPTUM SURGERY  80's  . POSTERIOR LUMBAR FUSION  06/2010; 10/2011   "placed screws, rods, spacers both times"  . POSTERIOR LUMBAR FUSION 4 LEVEL N/A 12/13/2018   Procedure: THORACIC NINE -LUMBAR THREE POSTERIOR INSTRUMENTATION FUSION;  Surgeon: Judith Part, MD;  Location: [facility];  Service: Neurosurgery;  Laterality: N/A;  . REPAIR DURAL / CSF LEAK  02/2009  . THORACIC LAMINECTOMY FOR  EPIDURAL ABSCESS N/A 09/28/2018   Procedure: THORACIC NINE-THORACIC TEN, THORACIC TEN-THORACIC ELEVEN, THORACIC ELEVEN-THORACIC TWELVE LAMINECTOMIES FOR EPIDURAL ABSCESS;  Surgeon: Judith Part, MD;  Location: [facility];  Service: Neurosurgery;  Laterality: N/A;  . TOTAL ABDOMINAL HYSTERECTOMY  02/1993     OB History   No obstetric history on file.     Family History  Problem Relation Age of Onset  . Arthritis Mother   . Heart attack Father   . Hypertension Sister   . Lung cancer Sister   . Asthma Sister   . Allergies Sister     Social History   Tobacco Use  . Smoking status: Never Smoker  . Smokeless tobacco: Never Used  Vaping Use  . Vaping Use: Never used  Substance Use Topics  . Alcohol use: No    Alcohol/week: 0.0 standard drinks  . Drug use: No    Home Medications Prior to Admission medications   Medication Sig Start Date End Date Taking? Authorizing Provider  acetaminophen (TYLENOL 8 HOUR) 650 MG CR tablet Take 1 tablet (650 mg total) by mouth every 8 (eight) hours as needed for pain. 12/17/19   Suzy Bouchard, PA-C  carbidopa-levodopa (SINEMET IR) 25-100 MG tablet Take 2 tablets by mouth 3 (three) times daily. 04/10/20   TatEustace Quail, DO  Cholecalciferol 50 MCG (2000 UT) CAPS Take 4,000 Units by mouth daily.    [provider]  DULoxetine (CYMBALTA) 60 MG capsule Take 60 mg by mouth 2 (two) times daily with a meal. 10/25/19   [provider]  flecainide (TAMBOCOR) 50 MG tablet TAKE 1 TABLET(50 MG) BY MOUTH TWICE DAILY Patient taking differently: Take 50 mg by mouth 2 (two) times daily. 05/27/20   Josue Hector, MD  furosemide (LASIX) 20 MG tablet TAKE 1 TABLET(20 MG) BY MOUTH DAILY Patient taking differently: Take 20 mg by mouth daily. 04/28/20   Bhagat, Crista Luria, PA  gabapentin (NEURONTIN) 300 MG capsule Take 300-600 mg by mouth See admin instructions. Take one capsule (300 mg) by mouth daily with lunch and supper, and take two capsules  (600 mg) at bedtime.    [provider]  Menthol, Topical Analgesic, (BIOFREEZE EX) Apply 1 application topically daily as needed (pain).     [provider]  metoprolol succinate (TOPROL-XL) 25 MG 24 hr tablet Take 12.5 mg by mouth daily.    [provider]  NON FORMULARY Gaba 750mg  take 1 tab once a day    [provider]  pantoprazole (PROTONIX) 40 MG tablet Take 40 mg by mouth daily.     [provider]  polyethylene glycol (MIRALAX / GLYCOLAX) packet Take 17 g by mouth daily. Patient taking differently: Take 17 g by mouth daily as needed for mild constipation. 07/10/18   Raiford Noble Latif, DO  potassium chloride SA (K-DUR) 20 MEQ tablet Take 1 tablet (20 mEq total) by mouth daily. 01/01/19   Bhagat, Crista Luria, PA  pravastatin (PRAVACHOL) 40 MG tablet Take 40  mg by mouth daily with lunch.    [provider]  TRAVATAN Z 0.004 % SOLN ophthalmic solution Place 1 drop into both eyes at bedtime.  01/21/15   [provider]    Allergies    Lyrica [pregabalin], Zocor [simvastatin], and Relafen [nabumetone]  Review of Systems   Review of Systems  Neurological: Positive for headaches.  All other systems reviewed and are negative.   Physical Exam Updated Vital Signs BP (!) 162/72 (BP Location: Left Arm)   Pulse 73   Temp 97.8 F (36.6 C) (Oral)   Resp 19   Ht 5\' 6"  (1.676 m)   Wt 79 kg   SpO2 100%   BMI 28.11 kg/m   Physical Exam Vitals and nursing note reviewed.  Constitutional:      Appearance: Normal appearance.  HENT:     Head: Normocephalic and atraumatic.     Right Ear: External ear normal.     Left Ear: External ear normal.     Nose: Nose normal.     Mouth/Throat:     Mouth: Mucous membranes are moist.     Pharynx: Oropharynx is clear.  Eyes:     Extraocular Movements: Extraocular movements intact.     Conjunctiva/sclera: Conjunctivae normal.     Pupils: Pupils are equal, round, and reactive to light.   Cardiovascular:     Rate and Rhythm: Normal rate and regular rhythm.     Pulses: Normal pulses.     Heart sounds: Normal heart sounds.  Pulmonary:     Effort: Pulmonary effort is normal.     Breath sounds: Normal breath sounds.  Abdominal:     General: Abdomen is flat. Bowel sounds are normal.     Palpations: Abdomen is soft.  Musculoskeletal:        General: Normal range of motion.     Cervical back: Normal range of motion and neck supple.  Skin:    General: Skin is warm.     Capillary Refill: Capillary refill takes less than 2 seconds.  Neurological:     Mental Status: She is alert.     Comments: Diffuse weakness (chronic)  Psychiatric:        Mood and Affect: Mood normal.        Behavior: Behavior normal.     ED Results / Procedures / Treatments   Labs (all labs ordered are listed, but only abnormal results are displayed) Labs Reviewed  BASIC METABOLIC PANEL - Abnormal; Notable for the following components:      Result Value   Glucose, Bld 110 (*)    GFR, Estimated 59 (*)    All other components within normal limits  URINALYSIS, ROUTINE W REFLEX MICROSCOPIC - Abnormal; Notable for the following components:   Color, Urine COLORLESS (*)    All other components within normal limits  URINE CULTURE  CBC WITH DIFFERENTIAL/PLATELET    EKG EKG Interpretation  Date/Time:  Saturday August 16 2020 17:12:43 EST Ventricular Rate:  75 PR Interval:    QRS Duration: 90 QT Interval:  357 QTC Calculation: 399 R Axis:   16 Text Interpretation: Normal sinus rhythm Confirmed by Isla Pence [id number]) on 08/16/2020 5:32:15 PM   Radiology CT Head Wo Contrast  Result Date: 08/16/2020 CLINICAL DATA:  Headache after recent fall EXAM: CT HEAD WITHOUT CONTRAST TECHNIQUE: Contiguous axial images were obtained from the base of the skull through the vertex without intravenous contrast. COMPARISON:  08/13/2020 FINDINGS: Brain: No evidence of acute infarction, hemorrhage,  hydrocephalus,  extra-axial collection or mass lesion/mass effect. Left basal ganglia perivascular space versus remote lacunar infarct. Scattered low-density changes within the periventricular and subcortical white matter compatible with chronic microvascular ischemic change. Mild diffuse cerebral volume loss. Vascular: Atherosclerotic calcifications involving the large vessels of the skull base. No unexpected hyperdense vessel. Skull: Normal. Negative for fracture or focal lesion. Sinuses/Orbits: Air-fluid level within the left maxillary sinus. Mild right maxillary mucosal thickening. Orbital structures appear within normal limits. Other: Negative for scalp hematoma. IMPRESSION: 1. No acute intracranial findings. 2. Chronic microvascular ischemic change and cerebral volume loss. 3. Air-fluid level within the left maxillary sinus. Correlate for signs and symptoms of acute sinusitis. Electronically Signed   By: Davina Poke D.O.   On: 08/16/2020 18:05    Procedures Procedures   Medications Ordered in ED Medications  0.9 %  sodium chloride infusion (has no administration in time range)    ED Course  I have reviewed the triage vital signs and the nursing notes.  Pertinent labs & imaging results that were available during my care of the patient were reviewed by me and considered in my medical decision making (see chart for details).    MDM Rules/Calculators/A&P                          Repeat CT head ok.  Pt is feeling better.  She is stable for d/c.  Return if worse. Final Clinical Impression(s) / ED Diagnoses Final diagnoses:  Fall, subsequent encounter  Acute nonintractable headache, unspecified headache type    Rx / DC Orders ED Discharge Orders    None       Isla Pence, MD 08/16/20 1815

## 2020-08-16 NOTE — ED Notes (Signed)
Had a fall on Wednesday - pt kept leaning to the right while at the dinner table - eventually fell and hit head on the right  Last night pressure in the head - both sides of her head. Complex medical HX

## 2020-08-16 NOTE — Discharge Instructions (Signed)
You can take a maximum of 4 grams of tylenol in one day.  Your Tylenol is a 650 mg dose, so you can take 6 pills in 1 day.

## 2020-08-16 NOTE — ED Notes (Signed)
Pt to CT

## 2020-08-18 LAB — URINE CULTURE: Culture: <10000 — AB

## 2020-08-31 ENCOUNTER — Emergency Department (HOSPITAL_BASED_OUTPATIENT_CLINIC_OR_DEPARTMENT_OTHER): Payer: HMO | Admitting: Radiology

## 2020-08-31 ENCOUNTER — Emergency Department (HOSPITAL_BASED_OUTPATIENT_CLINIC_OR_DEPARTMENT_OTHER)
Admission: EM | Admit: 2020-08-31 | Discharge: 2020-08-31 | Disposition: A | Discharge status: Home / Self Care | Payer: HMO | Attending: Emergency Medicine | Admitting: Emergency Medicine

## 2020-08-31 ENCOUNTER — Emergency Department (HOSPITAL_BASED_OUTPATIENT_CLINIC_OR_DEPARTMENT_OTHER): Payer: HMO

## 2020-08-31 ENCOUNTER — Other Ambulatory Visit: Payer: Self-pay

## 2020-08-31 ENCOUNTER — Encounter (HOSPITAL_BASED_OUTPATIENT_CLINIC_OR_DEPARTMENT_OTHER): Payer: Self-pay | Admitting: Obstetrics and Gynecology

## 2020-08-31 DIAGNOSIS — R519 Headache, unspecified: Secondary | ICD-10-CM | POA: Insufficient documentation

## 2020-08-31 DIAGNOSIS — M25531 Pain in right wrist: Secondary | ICD-10-CM | POA: Insufficient documentation

## 2020-08-31 DIAGNOSIS — I509 Heart failure, unspecified: Secondary | ICD-10-CM | POA: Diagnosis not present

## 2020-08-31 DIAGNOSIS — I110 Hypertensive heart disease with heart failure: Secondary | ICD-10-CM | POA: Diagnosis not present

## 2020-08-31 DIAGNOSIS — W19XXXA Unspecified fall, initial encounter: Secondary | ICD-10-CM | POA: Insufficient documentation

## 2020-08-31 DIAGNOSIS — Z79899 Other long term (current) drug therapy: Secondary | ICD-10-CM | POA: Diagnosis not present

## 2020-08-31 DIAGNOSIS — M25511 Pain in right shoulder: Secondary | ICD-10-CM | POA: Insufficient documentation

## 2020-08-31 DIAGNOSIS — S0990XA Unspecified injury of head, initial encounter: Secondary | ICD-10-CM | POA: Diagnosis not present

## 2020-08-31 NOTE — ED Notes (Signed)
Patient in CT. Will update VS when patient returns.

## 2020-08-31 NOTE — ED Triage Notes (Signed)
Patient reports to the ER after a fall. Patient had a fall today, no LOC, not on blood thinners. Patient reports she hit her head and right arm. Patient endorses pain 5/10 in the right arm and head.

## 2020-08-31 NOTE — ED Provider Notes (Signed)
[facility] Provider Note  CSN: [id number] Arrival date & time: 08/31/20 1236    History Chief Complaint  Patient presents with  . Fall    HPI  [name] is a 80 y.o. female with history of progressive supranuclear palsy and frequent falls typically uses a walker but she cannot maneuver it into the hallway leading to her bathroom, so she has to walk a short distance unaided to get to and from the bathroom. She has rails installed on the walls to hold on to, but today her legs gave out and she fell down, hitting the right side of her head on the ground and landing on her R arm. She is complaining of right sided headache and R wrist pain,worse with movement. She did not have LOC and is not taking any blood thinners. She is followed by neurology and was seen there after a recent fall earlier this month. She has just recently been approved for a mobility chair but has not gotten it yet.    Past Medical History:  Diagnosis Date  . Allergic rhinitis   . Arthritis    "all over"  . CHF (congestive heart failure) ([place])   . Chronic back pain   . DJD (degenerative joint disease)   . 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 GERD (gastroesophageal reflux disease)   . Hyperlipidemia   . Hypertension   . Lumbar stenosis   . OSA (obstructive sleep apnea)    "couldn't handle CPAP; use mouth guard some; not all the time" (01/06/2014)  . PAF (paroxysmal atrial fibrillation) ([place])   . Scoliosis    with radiculopathy L2-S1 with prior surgery  . Small bowel obstruction ([facility])    versus ileus after last bck surgery  . Spondylosis     Past Surgical History:  Procedure Laterality Date  . BACK SURGERY    . CATARACT EXTRACTION W/ INTRAOCULAR LENS  IMPLANT, BILATERAL  2011  . CHOLECYSTECTOMY N/A 07/07/2018   Procedure: LAPAROSCOPIC CHOLECYSTECTOMY;  Surgeon: Erroll Luna, MD;  Location: [facility];  Service: General;  Laterality: N/A;  . COLONOSCOPY  12/09/2004  . LIPOMA  EXCISION  1980's   "fatty tumors"  . [place] SURGERY  02/2009   "ruptured disc"  . NASAL SEPTUM SURGERY  80's  . POSTERIOR LUMBAR FUSION  06/2010; 10/2011   "placed screws, rods, spacers both times"  . POSTERIOR LUMBAR FUSION 4 LEVEL N/A 12/13/2018   Procedure: THORACIC NINE -LUMBAR THREE POSTERIOR INSTRUMENTATION FUSION;  Surgeon: Judith Part, MD;  Location: [facility];  Service: Neurosurgery;  Laterality: N/A;  . REPAIR DURAL / CSF LEAK  02/2009  . THORACIC LAMINECTOMY FOR EPIDURAL ABSCESS N/A 09/28/2018   Procedure: THORACIC NINE-THORACIC TEN, THORACIC TEN-THORACIC ELEVEN, THORACIC ELEVEN-THORACIC TWELVE LAMINECTOMIES FOR EPIDURAL ABSCESS;  Surgeon: Judith Part, MD;  Location: [facility];  Service: Neurosurgery;  Laterality: N/A;  . TOTAL ABDOMINAL HYSTERECTOMY  02/1993    Family History  Problem Relation Age of Onset  . Arthritis Mother   . Heart attack Father   . Hypertension Sister   . Lung cancer Sister   . Asthma Sister   . Allergies Sister     Social History   Tobacco Use  . Smoking status: Never Smoker  . Smokeless tobacco: Never Used  Vaping Use  . Vaping Use: Never used  Substance Use Topics  . Alcohol use: No    Alcohol/week: 0.0 standard drinks  . Drug use: No     Home Medications  Prior to Admission medications   Medication Sig Start Date End Date Taking? Authorizing Provider  acetaminophen (TYLENOL 8 HOUR) 650 MG CR tablet Take 1 tablet (650 mg total) by mouth every 8 (eight) hours as needed for pain. 12/17/19   Suzy Bouchard, PA-C  carbidopa-levodopa (SINEMET IR) 25-100 MG tablet Take 2 tablets by mouth 3 (three) times daily. 04/10/20   TatEustace Quail, DO  Cholecalciferol 50 MCG (2000 UT) CAPS Take 4,000 Units by mouth daily.    [provider]  DULoxetine (CYMBALTA) 60 MG capsule Take 60 mg by mouth 2 (two) times daily with a meal. 10/25/19   [provider]  flecainide (TAMBOCOR) 50 MG tablet TAKE 1 TABLET(50 MG) BY MOUTH TWICE  DAILY Patient taking differently: Take 50 mg by mouth 2 (two) times daily. 05/27/20   Josue Hector, MD  furosemide (LASIX) 20 MG tablet TAKE 1 TABLET(20 MG) BY MOUTH DAILY Patient taking differently: Take 20 mg by mouth daily. 04/28/20   Bhagat, Crista Luria, PA  gabapentin (NEURONTIN) 300 MG capsule Take 300-600 mg by mouth See admin instructions. Take one capsule (300 mg) by mouth daily with lunch and supper, and take two capsules (600 mg) at bedtime.    [provider]  Menthol, Topical Analgesic, (BIOFREEZE EX) Apply 1 application topically daily as needed (pain).     [provider]  metoprolol succinate (TOPROL-XL) 25 MG 24 hr tablet Take 12.5 mg by mouth daily.    [provider]  NON FORMULARY Gaba 750mg  take 1 tab once a day    [provider]  pantoprazole (PROTONIX) 40 MG tablet Take 40 mg by mouth daily.     [provider]  polyethylene glycol (MIRALAX / GLYCOLAX) packet Take 17 g by mouth daily. Patient taking differently: Take 17 g by mouth daily as needed for mild constipation. 07/10/18   Raiford Noble Latif, DO  potassium chloride SA (K-DUR) 20 MEQ tablet Take 1 tablet (20 mEq total) by mouth daily. 01/01/19   Bhagat, Crista Luria, PA  pravastatin (PRAVACHOL) 40 MG tablet Take 40 mg by mouth daily with lunch.    [provider]  TRAVATAN Z 0.004 % SOLN ophthalmic solution Place 1 drop into both eyes at bedtime.  01/21/15   [provider]     Allergies    Lyrica [pregabalin], Zocor [simvastatin], and Relafen [nabumetone]   Review of Systems   Review of Systems A comprehensive review of systems was completed and negative except as noted in HPI.    Physical Exam BP (!) 154/66   Pulse 66   Temp (!) 97.5 F (36.4 C) (Oral)   Resp 15   SpO2 98%   Physical Exam Vitals and nursing note reviewed.  Constitutional:      Appearance: Normal appearance.  HENT:     Head: Normocephalic and atraumatic.     Nose: Nose  normal.     Mouth/Throat:     Mouth: Mucous membranes are moist.  Eyes:     Extraocular Movements: Extraocular movements intact.     Conjunctiva/sclera: Conjunctivae normal.  Cardiovascular:     Rate and Rhythm: Normal rate.  Pulmonary:     Effort: Pulmonary effort is normal.     Breath sounds: Normal breath sounds.  Abdominal:     General: Abdomen is flat.     Palpations: Abdomen is soft.     Tenderness: There is no abdominal tenderness.  Musculoskeletal:        General: Tenderness (  tenderness over R anterior shoulder/clavicle and R wrist) present. No swelling or deformity. Normal range of motion.     Cervical back: Neck supple.  Skin:    General: Skin is warm and dry.  Neurological:     General: No focal deficit present.     Mental Status: She is alert.  Psychiatric:        Mood and Affect: Mood normal.      ED Results / Procedures / Treatments   Labs (all labs ordered are listed, but only abnormal results are displayed) Labs Reviewed - No data to display  EKG None   Radiology DG Shoulder Right  Result Date: 08/31/2020 CLINICAL DATA:  Fall, pain EXAM: RIGHT SHOULDER - 2+ VIEW COMPARISON:  None. FINDINGS: Suspect a very subtle fracture of the greater tuberosity of the humerus appreciated at the posterior aspect of the humeral head on axillary Y-view. There is a calcific density adjacent to the expected insertion of the rotator cuff, likely related to chronic tendinopathy, an avulsion fracture fragment not favored. Mild acromioclavicular and glenohumeral arthrosis. Soft tissues are unremarkable. IMPRESSION: 1. Suspect a very subtle fracture of the greater tuberosity of the humerus. Consider CT to more sensitively evaluate fracture anatomy. 2. There is a calcific density adjacent to the expected insertion of the rotator cuff, likely related to chronic tendinopathy, an avulsion fracture fragment not favored. 3. Mild acromioclavicular and glenohumeral arthrosis. Electronically  Signed   By: Eddie Candle M.D.   On: 08/31/2020 13:59   DG Wrist Complete Right  Result Date: 08/31/2020 CLINICAL DATA:  Right wrist pain after fall EXAM: RIGHT WRIST - COMPLETE 3+ VIEW COMPARISON:  None. FINDINGS: There is no evidence of fracture or dislocation. Negative ulnar variance. Degenerative changes noted at the basilar joint. Soft tissues are unremarkable. IMPRESSION: 1. No acute findings. 2. Basilar joint osteoarthritis. Electronically Signed   By: Kerby Moors M.D.   On: 08/31/2020 13:57   CT Head Wo Contrast  Result Date: 08/31/2020 CLINICAL DATA:  Head trauma.  Status post fall. EXAM: CT HEAD WITHOUT CONTRAST TECHNIQUE: Contiguous axial images were obtained from the base of the skull through the vertex without intravenous contrast. COMPARISON:  08/16/2020 FINDINGS: Brain: No evidence of acute infarction, hemorrhage, hydrocephalus, extra-axial collection or mass lesion/mass effect. There is mild diffuse low-attenuation within the subcortical and periventricular white matter compatible with chronic microvascular disease. Prominence of sulci and ventricles compatible with brain atrophy. Vascular: No hyperdense vessel or unexpected calcification. Skull: Normal. Negative for fracture or focal lesion. Sinuses/Orbits: No acute finding. Other: None IMPRESSION: 1. No acute intracranial abnormalities. 2. Chronic small vessel ischemic change and brain atrophy. Electronically Signed   By: Kerby Moors M.D.   On: 08/31/2020 13:46   CT Shoulder Right Wo Contrast  Result Date: 08/31/2020 CLINICAL DATA:  Right shoulder pain after fall. Suspected fracture on x-ray EXAM: CT OF THE UPPER RIGHT EXTREMITY WITHOUT CONTRAST TECHNIQUE: Multidetector CT imaging of the upper right extremity was performed according to the standard protocol. COMPARISON:  X-ray 08/31/2020 FINDINGS: Bones/Joint/Cartilage No acute fracture. No dislocation. Mild glenohumeral and acromioclavicular joint osteoarthritis. Subcortical  cystic changes within the greater tuberosity suggests underlying rotator cuff tendinopathy. No large glenohumeral joint effusion is evident. No large subacromial-subdeltoid bursal fluid collection. Remaining visualized osseous structures appear grossly intact. Ligaments Suboptimally assessed by CT. Muscles and Tendons Preserved muscle bulk of the rotator cuff musculature without significant atrophy or fatty infiltration. Adjacent to the greater tuberosity suggestive of hydroxyapatite deposition/calcific tendinopathy. No gross rotator  cuff tear by CT. Soft tissues No soft tissue fluid collection or hematoma. No axillary lymphadenopathy. Visualized lung field is clear. IMPRESSION: 1. No acute fracture or dislocation of the right shoulder. 2. Mild glenohumeral and acromioclavicular joint osteoarthritis. 3. Rotator cuff calcific tendinopathy. Electronically Signed   By: Davina Poke D.O.   On: 08/31/2020 14:37    Procedures Procedures  Medications Ordered in the ED Medications - No data to display   MDM Rules/Calculators/A&P MDM Patient with recurrent falls, followed by neurology and working to get a mobility chair. No other new complaints other than injury from today's fall. Will check imaging and reassess.   ED Course  I have reviewed the triage vital signs and the nursing notes.  Pertinent labs & imaging results that were available during my care of the patient were reviewed by me and considered in my medical decision making (see chart for details).  Clinical Course as of 08/31/20 1528  Sun Aug 31, 2020  1353 CT head is negative.  [CS]  1400 Wrist neg for fracture [CS]  1406 Shoulder xray images reviewed, will send for CT to evaluate potential fracture.  [CS]    Clinical Course User Index [CS] Truddie Hidden, MD    Final Clinical Impression(s) / ED Diagnoses Final diagnoses:  Fall, initial encounter    Rx / DC Orders ED Discharge Orders    None       Truddie Hidden,  MD 08/31/20 [phone]

## 2020-08-31 NOTE — ED Notes (Signed)
Fall risk armband placed on pt.

## 2020-09-11 DIAGNOSIS — G2581 Restless legs syndrome: Secondary | ICD-10-CM | POA: Diagnosis not present

## 2020-09-11 DIAGNOSIS — H409 Unspecified glaucoma: Secondary | ICD-10-CM | POA: Diagnosis not present

## 2020-09-11 DIAGNOSIS — G231 Progressive supranuclear ophthalmoplegia [Steele-Richardson-Olszewski]: Secondary | ICD-10-CM | POA: Diagnosis not present

## 2020-09-11 DIAGNOSIS — G629 Polyneuropathy, unspecified: Secondary | ICD-10-CM | POA: Diagnosis not present

## 2020-09-11 DIAGNOSIS — K219 Gastro-esophageal reflux disease without esophagitis: Secondary | ICD-10-CM | POA: Diagnosis not present

## 2020-09-11 DIAGNOSIS — I129 Hypertensive chronic kidney disease with stage 1 through stage 4 chronic kidney disease, or unspecified chronic kidney disease: Secondary | ICD-10-CM | POA: Diagnosis not present

## 2020-09-11 DIAGNOSIS — Z0000 Encounter for general adult medical examination without abnormal findings: Secondary | ICD-10-CM | POA: Diagnosis not present

## 2020-09-11 DIAGNOSIS — I4891 Unspecified atrial fibrillation: Secondary | ICD-10-CM | POA: Diagnosis not present

## 2020-09-11 DIAGNOSIS — N183 Chronic kidney disease, stage 3 unspecified: Secondary | ICD-10-CM | POA: Diagnosis not present

## 2020-09-11 DIAGNOSIS — E559 Vitamin D deficiency, unspecified: Secondary | ICD-10-CM | POA: Diagnosis not present

## 2020-09-11 DIAGNOSIS — R7302 Impaired glucose tolerance (oral): Secondary | ICD-10-CM | POA: Diagnosis not present

## 2020-09-11 DIAGNOSIS — E7800 Pure hypercholesterolemia, unspecified: Secondary | ICD-10-CM | POA: Diagnosis not present

## 2020-09-15 ENCOUNTER — Telehealth: Payer: Self-pay | Admitting: Neurology

## 2020-09-15 MED ORDER — CARBIDOPA-LEVODOPA 25-100 MG PO TABS: 2.0000 | ORAL_TABLET | Freq: Three times a day (TID) | ORAL | 0 refills | Status: DC

## 2020-09-15 NOTE — Telephone Encounter (Signed)
I don't think she needs the donepezil right now. You can refill the carbidopa/levodopa 25/100 3.  I think that she is still seeing Dr. Trula Ore for pain management.  I just asked that they let me do the treatment for PSP and him do the pain (he was treating both) OR they could just see him for both and not see me (there were too many neurologists in the "kitchen" and we were doing different things and it wasn't good for her care).  I told her I don't treat pain.

## 2020-09-15 NOTE — Telephone Encounter (Signed)
Spoke with patient and gave her Dr Doristine Devoid recommendations. She voiced understanding.   She wanted to know if she needs to continue Cymbalta. She requested a refill for carbidopa levodopa.  Spoke with Dr Tat who suggests the patient needs to contact the pain management doctor or contact her pcp to see what they suggest. She voiced understanding.   Follow up appointment scheduled.   Rx(s) sent to pharmacy electronically.

## 2020-09-15 NOTE — Telephone Encounter (Signed)
Patient called in stating Dr. Trula Ore had her on 2 medications and she would like to know if Dr. Carles Collet wants her to stay on the donepezil 10 mg twice a day and duloxetine 60 mg twice a day. If so, then she will need a prescription sent in to [facility] on [address]. She also mentioned she doesn't have anymore refills on her carbidopa-levodopa either.

## 2020-09-15 NOTE — Telephone Encounter (Signed)
Also if she is going to continue to f/u here, make an appt for 6-8 months

## 2020-09-24 DIAGNOSIS — G231 Progressive supranuclear ophthalmoplegia [Steele-Richardson-Olszewski]: Secondary | ICD-10-CM | POA: Diagnosis not present

## 2020-09-24 DIAGNOSIS — G2581 Restless legs syndrome: Secondary | ICD-10-CM | POA: Diagnosis not present

## 2020-09-24 DIAGNOSIS — G9009 Other idiopathic peripheral autonomic neuropathy: Secondary | ICD-10-CM | POA: Diagnosis not present

## 2020-10-01 DIAGNOSIS — N183 Chronic kidney disease, stage 3 unspecified: Secondary | ICD-10-CM | POA: Diagnosis not present

## 2020-10-01 DIAGNOSIS — E7800 Pure hypercholesterolemia, unspecified: Secondary | ICD-10-CM | POA: Diagnosis not present

## 2020-10-01 DIAGNOSIS — H409 Unspecified glaucoma: Secondary | ICD-10-CM | POA: Diagnosis not present

## 2020-10-01 DIAGNOSIS — G20 Parkinson's disease: Secondary | ICD-10-CM | POA: Diagnosis not present

## 2020-10-01 DIAGNOSIS — I129 Hypertensive chronic kidney disease with stage 1 through stage 4 chronic kidney disease, or unspecified chronic kidney disease: Secondary | ICD-10-CM | POA: Diagnosis not present

## 2020-10-01 DIAGNOSIS — I480 Paroxysmal atrial fibrillation: Secondary | ICD-10-CM | POA: Diagnosis not present

## 2020-10-01 DIAGNOSIS — R54 Age-related physical debility: Secondary | ICD-10-CM | POA: Diagnosis not present

## 2020-10-01 DIAGNOSIS — K219 Gastro-esophageal reflux disease without esophagitis: Secondary | ICD-10-CM | POA: Diagnosis not present

## 2020-10-06 DIAGNOSIS — H04123 Dry eye syndrome of bilateral lacrimal glands: Secondary | ICD-10-CM | POA: Diagnosis not present

## 2020-10-06 DIAGNOSIS — H532 Diplopia: Secondary | ICD-10-CM | POA: Diagnosis not present

## 2020-10-06 DIAGNOSIS — H5010 Unspecified exotropia: Secondary | ICD-10-CM | POA: Diagnosis not present

## 2020-10-06 DIAGNOSIS — H401131 Primary open-angle glaucoma, bilateral, mild stage: Secondary | ICD-10-CM | POA: Diagnosis not present

## 2020-10-08 DIAGNOSIS — M4714 Other spondylosis with myelopathy, thoracic region: Secondary | ICD-10-CM | POA: Diagnosis not present

## 2020-10-08 DIAGNOSIS — M5416 Radiculopathy, lumbar region: Secondary | ICD-10-CM | POA: Diagnosis not present

## 2020-10-08 DIAGNOSIS — M4325 Fusion of spine, thoracolumbar region: Secondary | ICD-10-CM | POA: Diagnosis not present

## 2020-10-19 IMAGING — CR PELVIS - 1-2 VIEW
1 series · 1 of 1 positions shown · non-contrast
Comparison: None.

CLINICAL DATA: Initial evaluation for acute pain.

EXAM:
PELVIS - 1-2 VIEW

[pelvis ap]
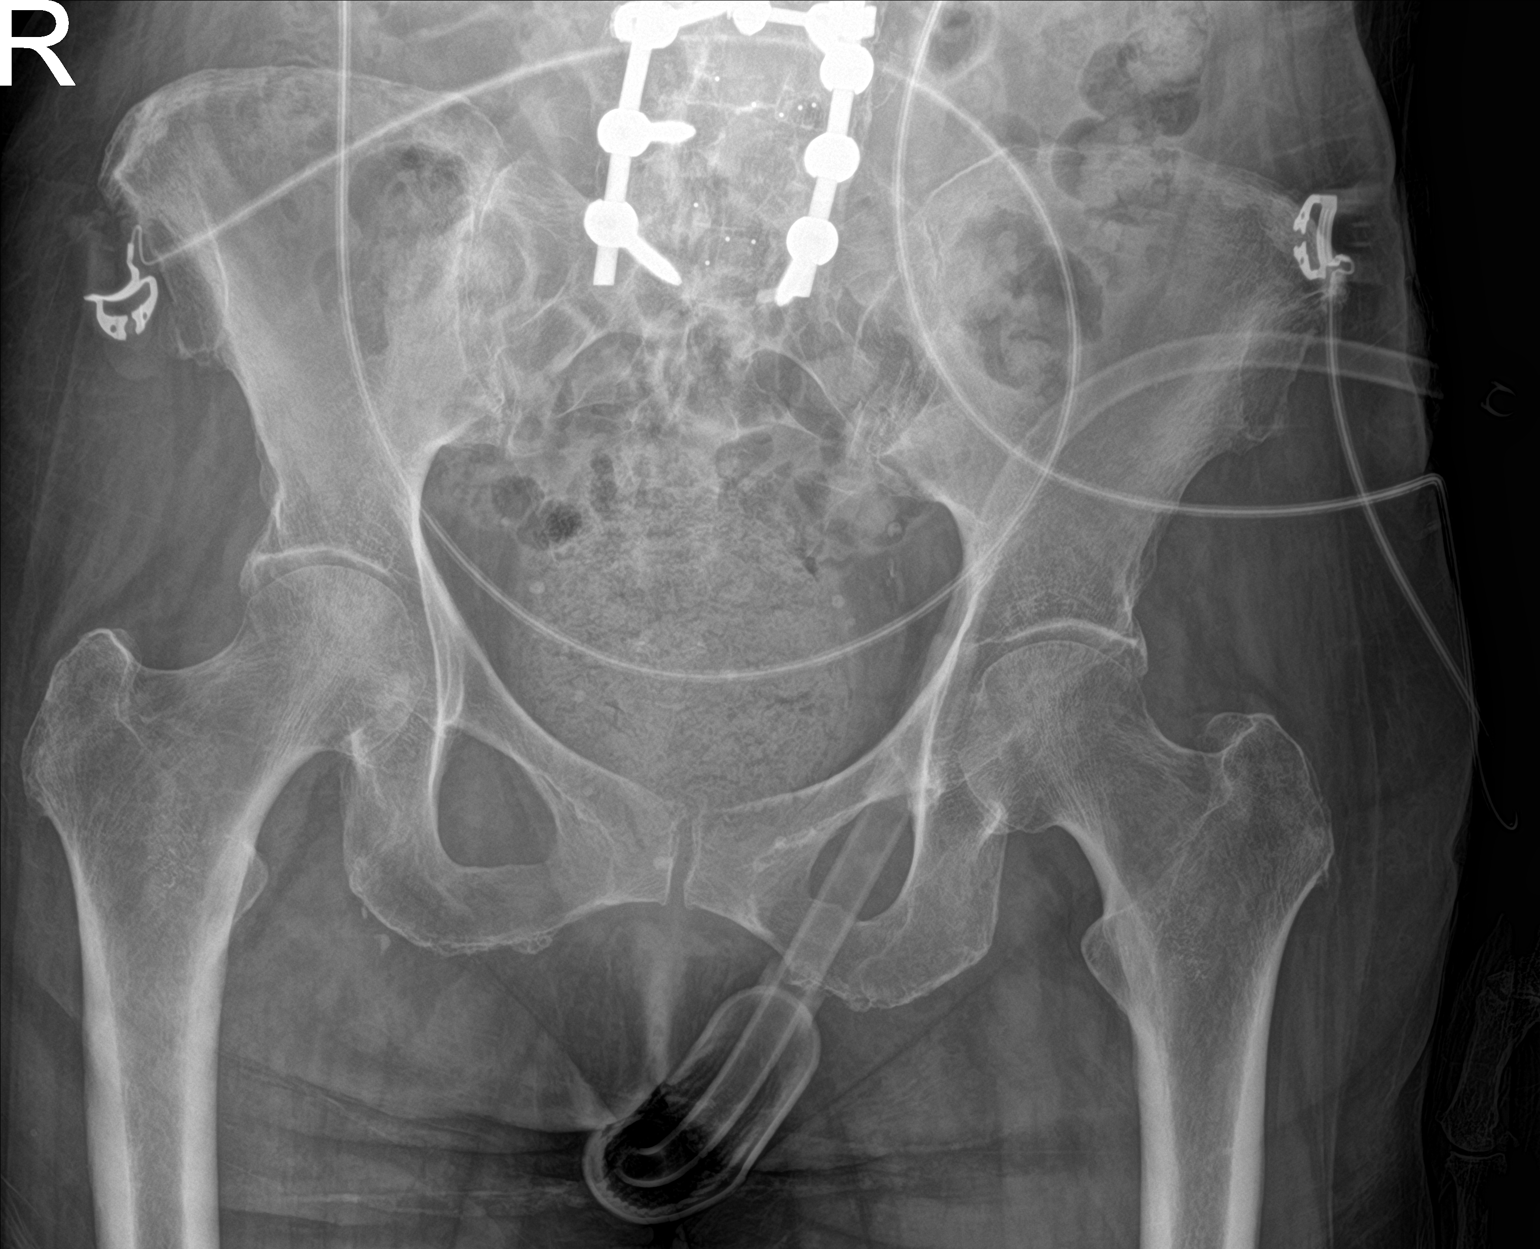

[1 of 1 positions shown; findings below may reference images not displayed]

FINDINGS: No acute fracture dislocation. No pubic diastasis. SI joints
approximated. Femoral heads in normal alignment within the
acetabula. Femoral head heights maintained.

Fusion hardware partially visualize within the lumbar spine.
Mild-to-moderate osteoarthritic changes about the hips bilaterally.

Large amount of retained stool seen within the rectal vault. Soft
tissues demonstrate no acute finding.
IMPRESSION: 1. No acute osseous abnormality about the pelvis.
2. Large amount of retained stool within the rectal vault,
suggesting constipation.
3. Mild to moderate degenerative osteoarthrosis about the hips
bilaterally.

## 2020-10-19 IMAGING — CT CT HEAD WITHOUT CONTRAST
4 of 8 series · 15 of 47 positions shown, 17 images · non-contrast
Comparison: 10/12/2013

CLINICAL DATA: Head trauma, altered mental status

EXAM:
CT HEAD WITHOUT CONTRAST
CT CERVICAL SPINE WITHOUT CONTRAST
TECHNIQUE: Multidetector CT imaging of the head and cervical spine was
performed following the standard protocol without intravenous
contrast. Multiplanar CT image reconstructions of the cervical spine
were also generated.

[Series 4: head 2.0 h70h · axial · 0.46mm/px · z∈[+1308,+1446]mm · 7 of 93 slices shown, 9 images]
[im 12/93  brain]
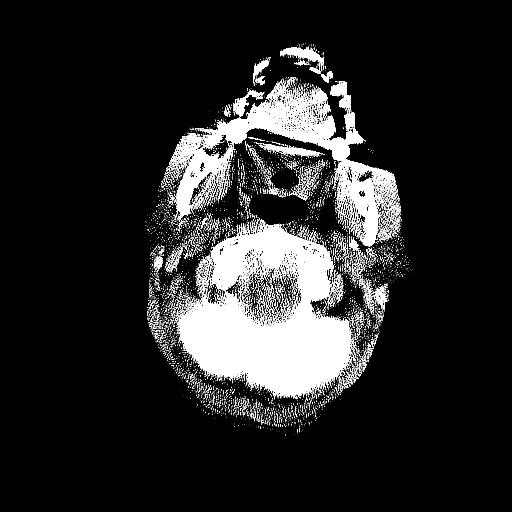
[im 12/93  bone]
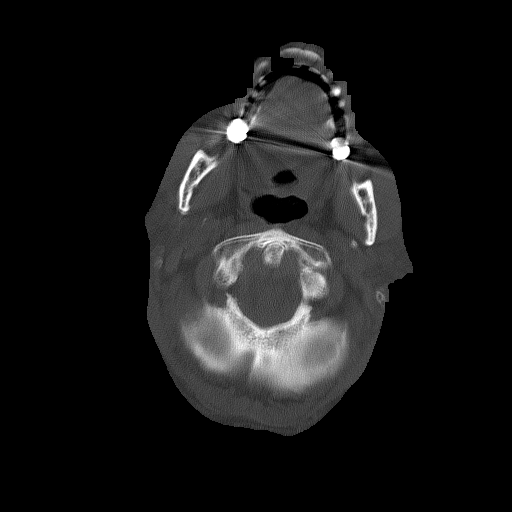
[im 24/93  brain]
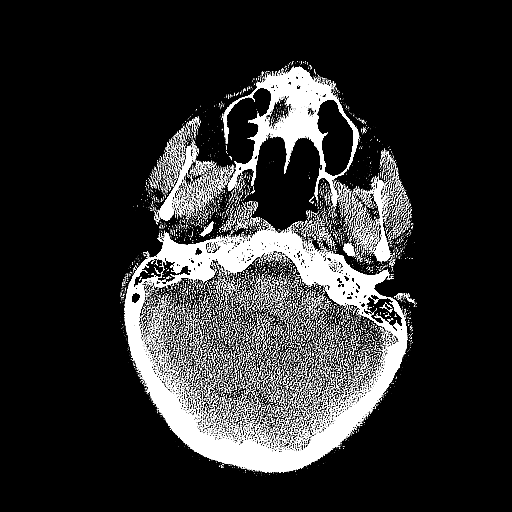
[im 35/93  brain]
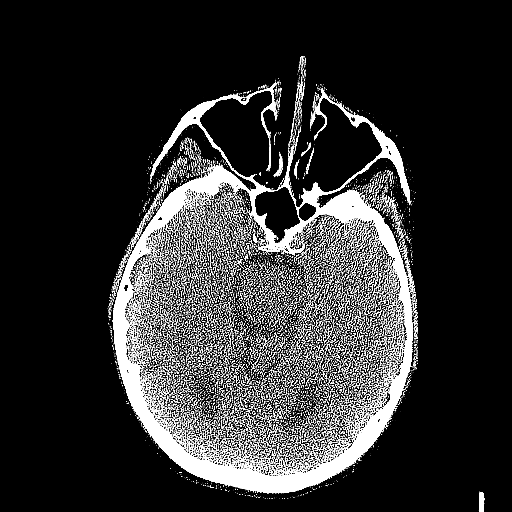
[im 47/93  brain]
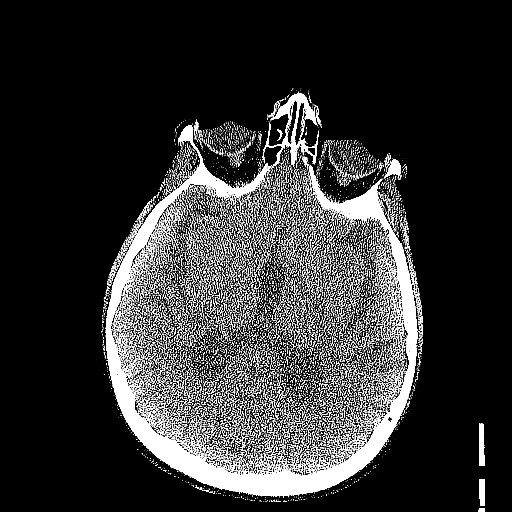
[im 58/93  brain]
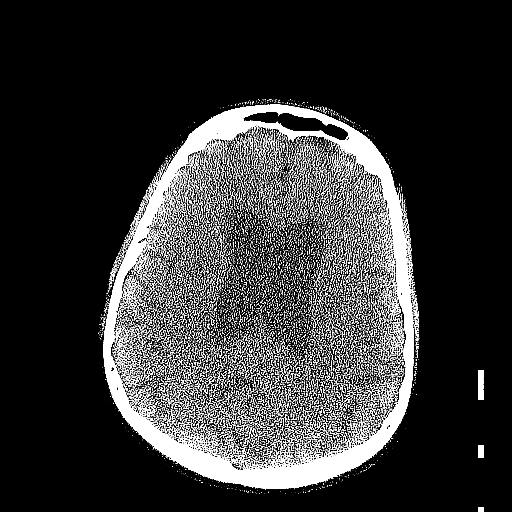
[im 58/93  bone]
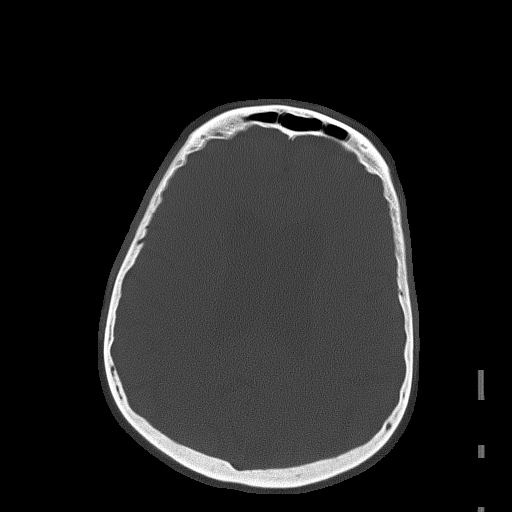
[im 70/93  brain]
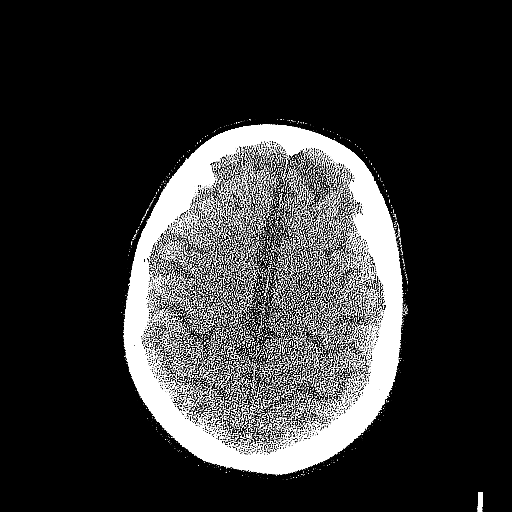
[im 81/93  brain]
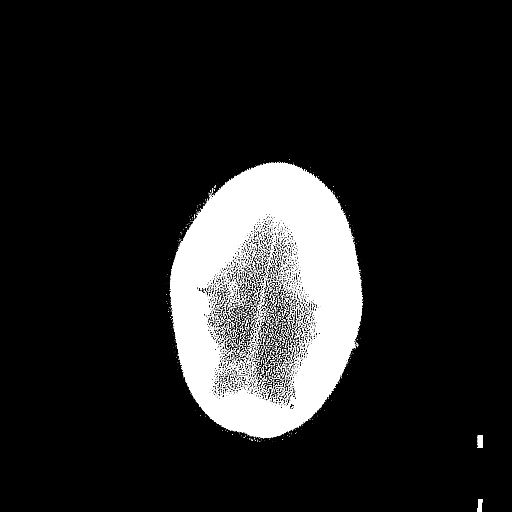

[Series 5: head 3.0 mpr cor · coronal · 0.36mm/px · 3 of 79 slices shown]
[im 23/79  brain]
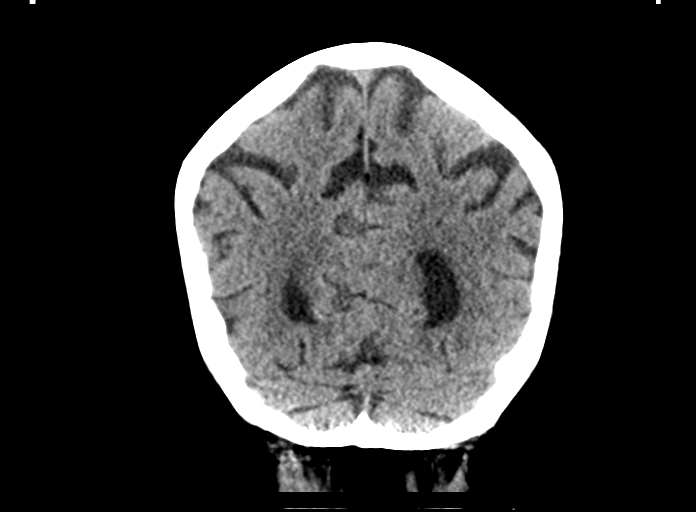
[im 34/79  brain]
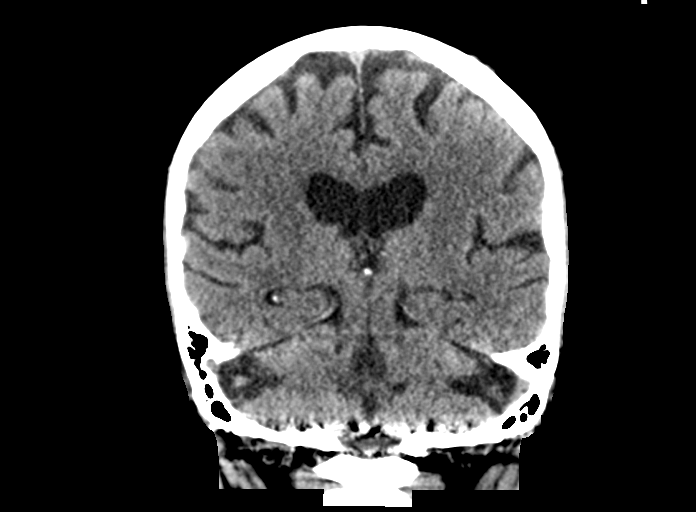
[im 45/79  brain]
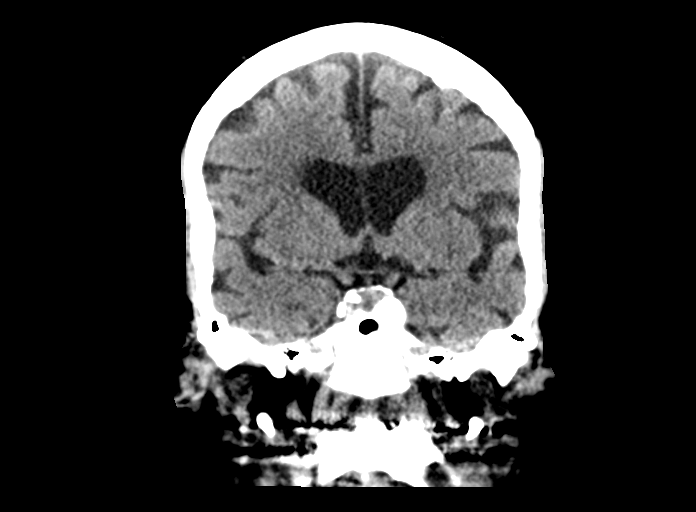

[Series 6: head 3.0 mpr sag · sagittal · 0.36mm/px · 2 of 67 slices shown]
[im 23/67  brain]
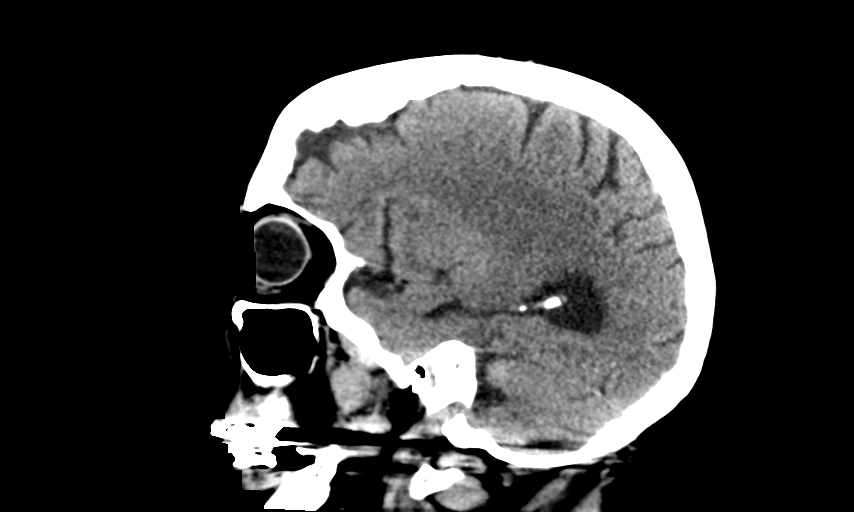
[im 45/67  brain]
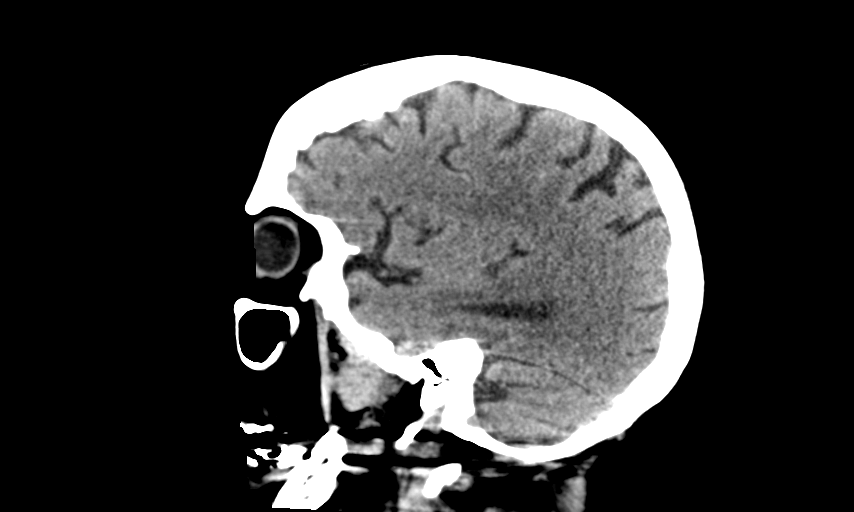

[Series 13: orthogonal axial st · axial · 0.21mm/px · z∈[+1173,+1213]mm · 3 of 77 slices shown]
[im 11/77  brain]
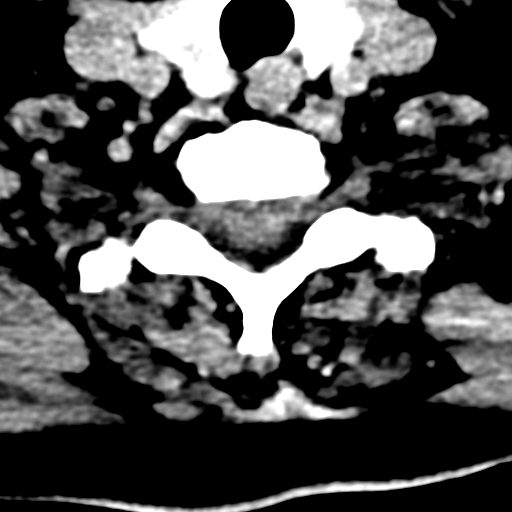
[im 22/77  brain]
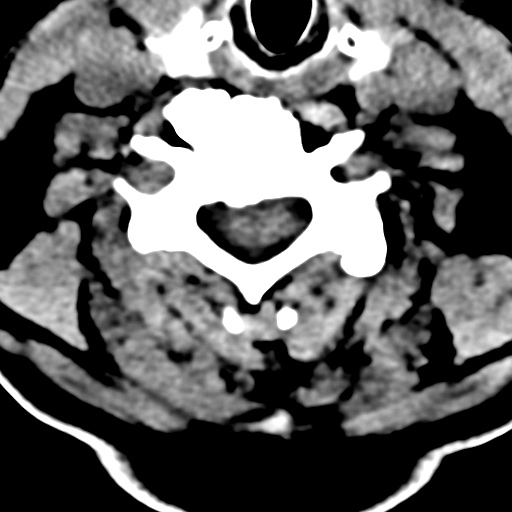
[im 33/77  brain]
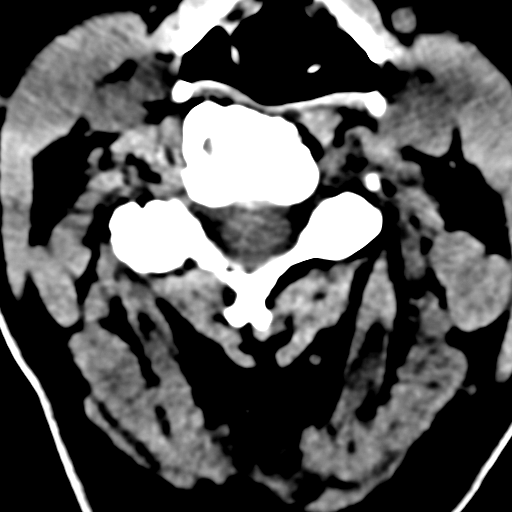

[15 of 47 positions shown; findings below may reference images not displayed]

FINDINGS: CT HEAD FINDINGS

Brain: No evidence of acute infarction, hemorrhage, hydrocephalus,
extra-axial collection or mass lesion/mass effect. Periventricular
white matter hypodensity.

Vascular: No hyperdense vessel or unexpected calcification.

Skull: Normal. Negative for fracture or focal lesion.

Sinuses/Orbits: No acute finding.

Other: None.

CT CERVICAL SPINE FINDINGS

Alignment: Normal.

Skull base and vertebrae: No acute fracture. No primary bone lesion
or focal pathologic process.

Soft tissues and spinal canal: No prevertebral fluid or swelling. No
visible canal hematoma.

Disc levels: Moderate multilevel disc space height loss and
osteophytosis.

Upper chest: Negative.

Other: None.
IMPRESSION: 1. No acute intracranial pathology. Small-vessel white matter
disease.

2.  No fracture or static subluxation of the cervical spine.

## 2020-10-19 IMAGING — CR CHEST - 2 VIEW
2 series · 2 of 2 positions shown · non-contrast
Comparison: Prior radiograph from 04/21/2016.

CLINICAL DATA: Initial evaluation for acute chest pain.

EXAM:
CHEST - 2 VIEW

[chest lat]
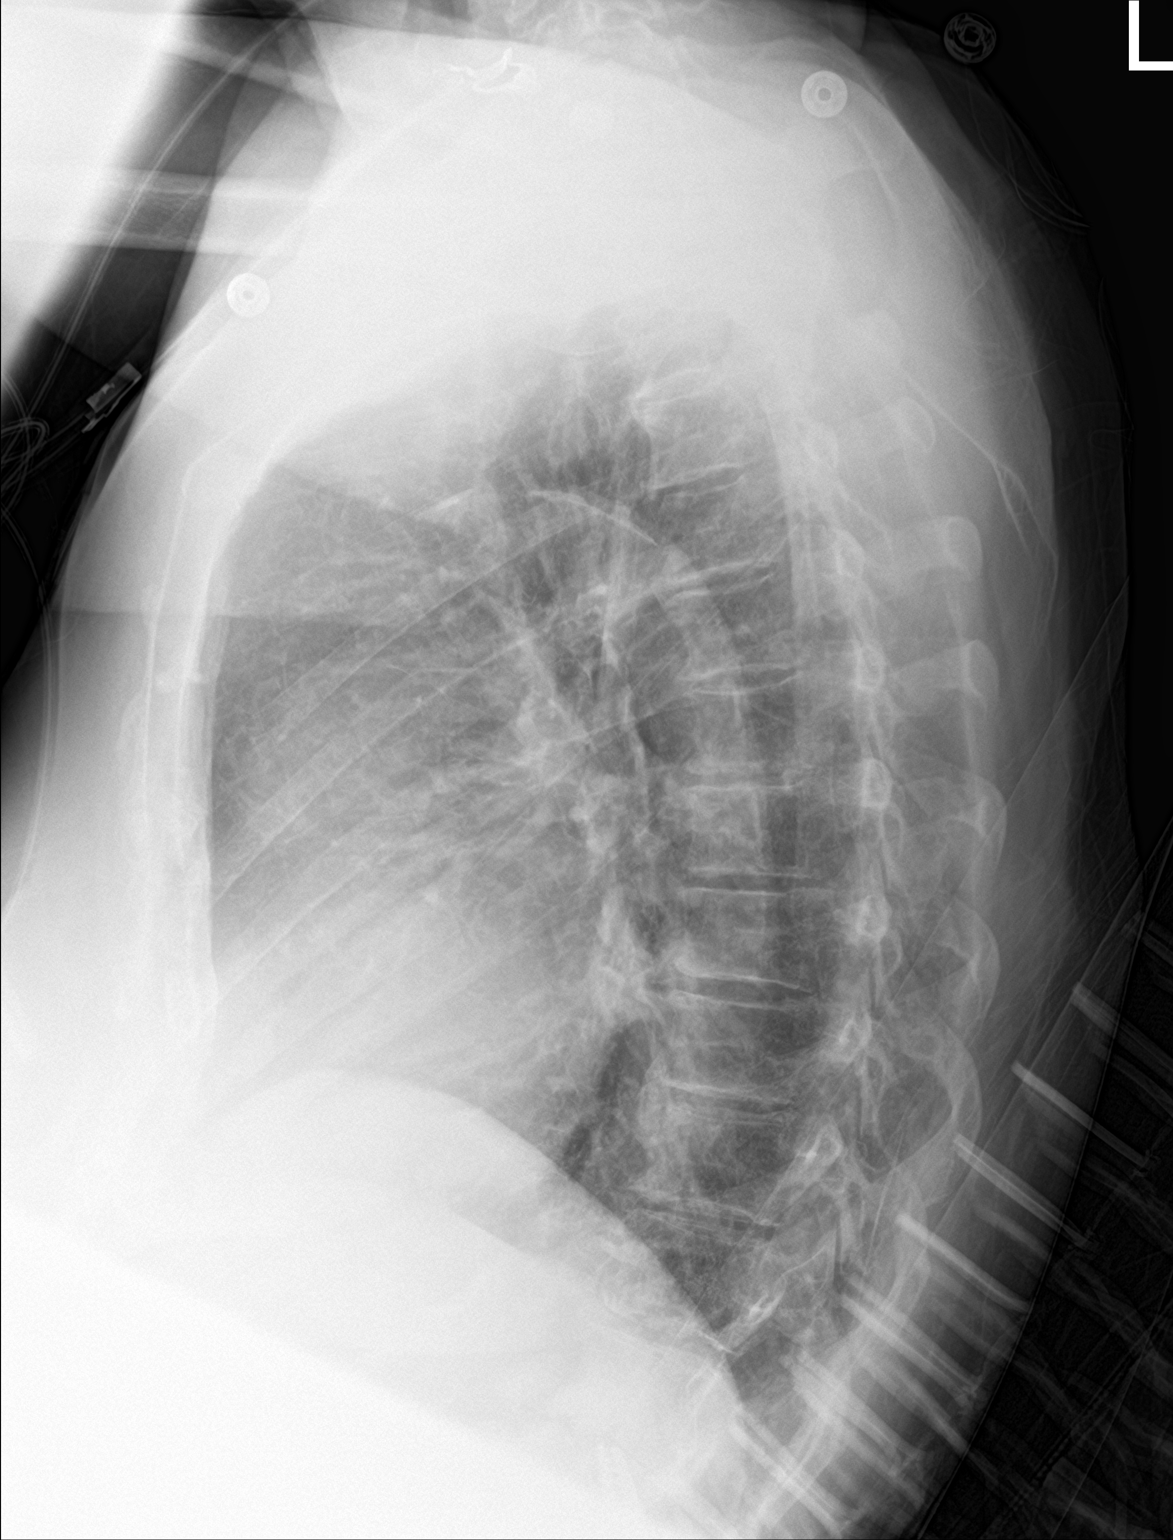

[chest ap]
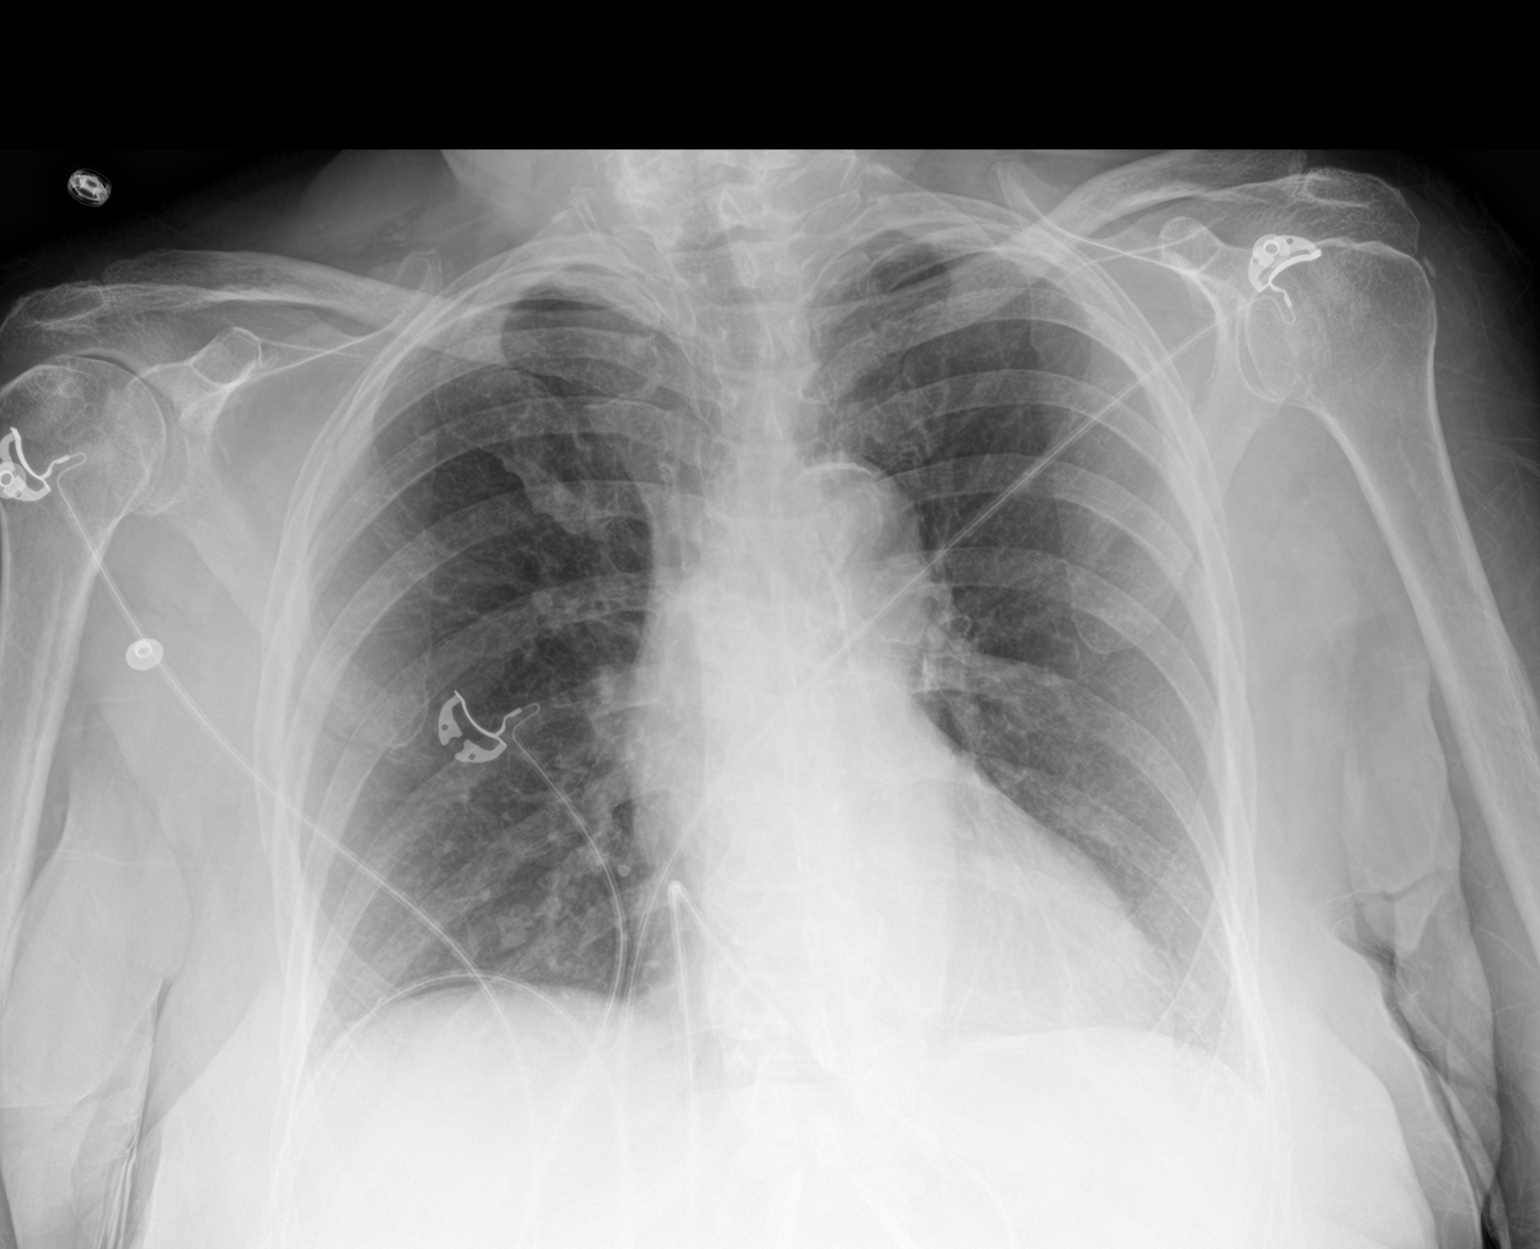

[2 of 2 positions shown; findings below may reference images not displayed]

FINDINGS: Transverse heart size at the upper limits of normal. Mediastinal
silhouette within normal limits. Aortic atherosclerosis.

Lungs normally inflated. No focal infiltrates. No pulmonary edema or
pleural effusion. No pneumothorax.

No acute osseous finding.
IMPRESSION: 1. No active cardiopulmonary disease.
2. Aortic atherosclerosis.

## 2020-10-24 DIAGNOSIS — G2581 Restless legs syndrome: Secondary | ICD-10-CM | POA: Diagnosis not present

## 2020-10-24 DIAGNOSIS — G231 Progressive supranuclear ophthalmoplegia [Steele-Richardson-Olszewski]: Secondary | ICD-10-CM | POA: Diagnosis not present

## 2020-10-24 DIAGNOSIS — G9009 Other idiopathic peripheral autonomic neuropathy: Secondary | ICD-10-CM | POA: Diagnosis not present

## 2020-11-05 DIAGNOSIS — R1084 Generalized abdominal pain: Secondary | ICD-10-CM | POA: Diagnosis not present

## 2020-11-10 DIAGNOSIS — N183 Chronic kidney disease, stage 3 unspecified: Secondary | ICD-10-CM | POA: Diagnosis not present

## 2020-11-10 DIAGNOSIS — K219 Gastro-esophageal reflux disease without esophagitis: Secondary | ICD-10-CM | POA: Diagnosis not present

## 2020-11-10 DIAGNOSIS — R54 Age-related physical debility: Secondary | ICD-10-CM | POA: Diagnosis not present

## 2020-11-10 DIAGNOSIS — H409 Unspecified glaucoma: Secondary | ICD-10-CM | POA: Diagnosis not present

## 2020-11-10 DIAGNOSIS — I480 Paroxysmal atrial fibrillation: Secondary | ICD-10-CM | POA: Diagnosis not present

## 2020-11-10 DIAGNOSIS — M1991 Primary osteoarthritis, unspecified site: Secondary | ICD-10-CM | POA: Diagnosis not present

## 2020-11-10 DIAGNOSIS — G20 Parkinson's disease: Secondary | ICD-10-CM | POA: Diagnosis not present

## 2020-11-10 DIAGNOSIS — I4891 Unspecified atrial fibrillation: Secondary | ICD-10-CM | POA: Diagnosis not present

## 2020-11-10 DIAGNOSIS — E7800 Pure hypercholesterolemia, unspecified: Secondary | ICD-10-CM | POA: Diagnosis not present

## 2020-11-10 DIAGNOSIS — I5032 Chronic diastolic (congestive) heart failure: Secondary | ICD-10-CM | POA: Diagnosis not present

## 2020-11-12 DIAGNOSIS — M5416 Radiculopathy, lumbar region: Secondary | ICD-10-CM | POA: Diagnosis not present

## 2020-11-12 DIAGNOSIS — M961 Postlaminectomy syndrome, not elsewhere classified: Secondary | ICD-10-CM | POA: Diagnosis not present

## 2020-11-24 DIAGNOSIS — G9009 Other idiopathic peripheral autonomic neuropathy: Secondary | ICD-10-CM | POA: Diagnosis not present

## 2020-11-24 DIAGNOSIS — G231 Progressive supranuclear ophthalmoplegia [Steele-Richardson-Olszewski]: Secondary | ICD-10-CM | POA: Diagnosis not present

## 2020-11-24 DIAGNOSIS — G2581 Restless legs syndrome: Secondary | ICD-10-CM | POA: Diagnosis not present

## 2020-12-01 ENCOUNTER — Other Ambulatory Visit: Payer: Self-pay

## 2020-12-01 ENCOUNTER — Encounter: Payer: Self-pay | Admitting: Podiatry

## 2020-12-01 ENCOUNTER — Ambulatory Visit: Payer: HMO | Admitting: Podiatry

## 2020-12-01 DIAGNOSIS — N1831 Chronic kidney disease, stage 3a: Secondary | ICD-10-CM

## 2020-12-01 DIAGNOSIS — M79675 Pain in left toe(s): Secondary | ICD-10-CM | POA: Insufficient documentation

## 2020-12-01 DIAGNOSIS — M79674 Pain in right toe(s): Secondary | ICD-10-CM

## 2020-12-01 DIAGNOSIS — M21371 Foot drop, right foot: Secondary | ICD-10-CM | POA: Diagnosis not present

## 2020-12-01 DIAGNOSIS — G629 Polyneuropathy, unspecified: Secondary | ICD-10-CM | POA: Diagnosis not present

## 2020-12-01 DIAGNOSIS — B351 Tinea unguium: Secondary | ICD-10-CM

## 2020-12-01 DIAGNOSIS — R2681 Unsteadiness on feet: Secondary | ICD-10-CM

## 2020-12-01 DIAGNOSIS — Z7901 Long term (current) use of anticoagulants: Secondary | ICD-10-CM | POA: Diagnosis not present

## 2020-12-01 NOTE — Progress Notes (Signed)
This patient returns to my office for at risk foot care.  This patient requires this care by a professional since this patient will be at risk due to having Dropfoot, CKD , neuropathy and chronic anticoagulation  This patient is unable to cut nails herself since the patient cannot reach her nails.These nails are painful walking and wearing shoes.  This patient presents for at risk foot care today.  General Appearance  Alert, conversant and in no acute stress.  Vascular  Dorsalis pedis and posterior tibial  pulses are  weakly palpable  bilaterally.  Capillary return is within normal limits  bilaterally. Temperature is within normal limits  bilaterally.  Neurologic  Senn-Weinstein monofilament wire test within normal limits  bilaterally. Muscle power within normal limits bilaterally.  Nails Thick disfigured discolored nails with subungual debris  hallux nails  B/L.Marland Kitchen No evidence of bacterial infection or drainage bilaterally.  Orthopedic  No limitations of motion  feet .  No crepitus or effusions noted.  No bony pathology or digital deformities noted.  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  HAV  B/L.  Skin  normotropic skin with no porokeratosis noted bilaterally.  No signs of infections or ulcers noted.     Onychomycosis  Pain in right toes  Pain in left toes  Consent was obtained for treatment procedures.   Mechanical debridement of nails 1-5  bilaterally performed with a nail nipper.  Filed with dremel without incident. Patient qualifies for diabetic shoes due to DPN and HAV  B/L.   Return office visit     prn                Told patient to return for periodic foot care and evaluation due to potential at risk complications.   Gardiner Barefoot DPM

## 2020-12-17 DIAGNOSIS — I10 Essential (primary) hypertension: Secondary | ICD-10-CM | POA: Diagnosis not present

## 2020-12-17 DIAGNOSIS — M4325 Fusion of spine, thoracolumbar region: Secondary | ICD-10-CM | POA: Diagnosis not present

## 2020-12-17 DIAGNOSIS — M5416 Radiculopathy, lumbar region: Secondary | ICD-10-CM | POA: Diagnosis not present

## 2020-12-17 DIAGNOSIS — M48061 Spinal stenosis, lumbar region without neurogenic claudication: Secondary | ICD-10-CM | POA: Diagnosis not present

## 2020-12-22 ENCOUNTER — Other Ambulatory Visit: Payer: Self-pay | Admitting: Neurology

## 2020-12-23 ENCOUNTER — Telehealth: Payer: Self-pay | Admitting: Neurology

## 2020-12-23 MED ORDER — CARBIDOPA-LEVODOPA 25-100 MG PO TABS: 2.0000 | ORAL_TABLET | Freq: Three times a day (TID) | ORAL | 0 refills | Status: DC

## 2020-12-23 NOTE — Telephone Encounter (Signed)
Pt called back in stating she needs a refill of her carbidopa-levodopa

## 2020-12-23 NOTE — Telephone Encounter (Signed)
Called and spoke to patient and informed her that she has to contact her pain management doctor (dr. Trula Ore) in regards to her pain in her legs and feet. Informed patient that Dr. 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 Doristine Devoid sees her.  Patient stated that she doesn't go to him anymore but goes to [facility] for shots that help with her pain. I informed patient that she will need to contact [facility] about her pain. Patient verbalized understanding and had no further questions or concerns.   Patient is aware that her Carbidopa/Levodopa will be sent to the pharmacy.

## 2020-12-23 NOTE — Telephone Encounter (Signed)
I would guess that is related to her chronic pain syndrome for which she has seen pain management (dr. Trula Ore).  I don't take care of that and its unrelated to her PSP, for which I see her

## 2020-12-23 NOTE — Telephone Encounter (Signed)
Patient called Access Nurse with complaints of pain in her legs and feet and requesting a refill of her carbidopa/levodopa 25/100 taking two tablets 3 times a day.

## 2020-12-24 DIAGNOSIS — G231 Progressive supranuclear ophthalmoplegia [Steele-Richardson-Olszewski]: Secondary | ICD-10-CM | POA: Diagnosis not present

## 2020-12-24 DIAGNOSIS — G9009 Other idiopathic peripheral autonomic neuropathy: Secondary | ICD-10-CM | POA: Diagnosis not present

## 2020-12-24 DIAGNOSIS — G2581 Restless legs syndrome: Secondary | ICD-10-CM | POA: Diagnosis not present

## 2020-12-31 DIAGNOSIS — M5416 Radiculopathy, lumbar region: Secondary | ICD-10-CM | POA: Diagnosis not present

## 2021-01-20 DIAGNOSIS — R54 Age-related physical debility: Secondary | ICD-10-CM | POA: Diagnosis not present

## 2021-01-20 DIAGNOSIS — I4891 Unspecified atrial fibrillation: Secondary | ICD-10-CM | POA: Diagnosis not present

## 2021-01-20 DIAGNOSIS — H409 Unspecified glaucoma: Secondary | ICD-10-CM | POA: Diagnosis not present

## 2021-01-20 DIAGNOSIS — I5032 Chronic diastolic (congestive) heart failure: Secondary | ICD-10-CM | POA: Diagnosis not present

## 2021-01-20 DIAGNOSIS — I129 Hypertensive chronic kidney disease with stage 1 through stage 4 chronic kidney disease, or unspecified chronic kidney disease: Secondary | ICD-10-CM | POA: Diagnosis not present

## 2021-01-20 DIAGNOSIS — K219 Gastro-esophageal reflux disease without esophagitis: Secondary | ICD-10-CM | POA: Diagnosis not present

## 2021-01-20 DIAGNOSIS — M1991 Primary osteoarthritis, unspecified site: Secondary | ICD-10-CM | POA: Diagnosis not present

## 2021-01-20 DIAGNOSIS — E7800 Pure hypercholesterolemia, unspecified: Secondary | ICD-10-CM | POA: Diagnosis not present

## 2021-01-20 DIAGNOSIS — M47816 Spondylosis without myelopathy or radiculopathy, lumbar region: Secondary | ICD-10-CM | POA: Diagnosis not present

## 2021-01-20 DIAGNOSIS — N183 Chronic kidney disease, stage 3 unspecified: Secondary | ICD-10-CM | POA: Diagnosis not present

## 2021-01-21 DIAGNOSIS — Z111 Encounter for screening for respiratory tuberculosis: Secondary | ICD-10-CM | POA: Diagnosis not present

## 2021-01-24 DIAGNOSIS — G231 Progressive supranuclear ophthalmoplegia [Steele-Richardson-Olszewski]: Secondary | ICD-10-CM | POA: Diagnosis not present

## 2021-01-24 DIAGNOSIS — G9009 Other idiopathic peripheral autonomic neuropathy: Secondary | ICD-10-CM | POA: Diagnosis not present

## 2021-01-24 DIAGNOSIS — G2581 Restless legs syndrome: Secondary | ICD-10-CM | POA: Diagnosis not present

## 2021-02-11 DIAGNOSIS — H04123 Dry eye syndrome of bilateral lacrimal glands: Secondary | ICD-10-CM | POA: Diagnosis not present

## 2021-02-11 DIAGNOSIS — H401131 Primary open-angle glaucoma, bilateral, mild stage: Secondary | ICD-10-CM | POA: Diagnosis not present

## 2021-02-13 DIAGNOSIS — G231 Progressive supranuclear ophthalmoplegia [Steele-Richardson-Olszewski]: Secondary | ICD-10-CM | POA: Diagnosis not present

## 2021-02-13 DIAGNOSIS — M4645 Discitis, unspecified, thoracolumbar region: Secondary | ICD-10-CM | POA: Diagnosis not present

## 2021-02-13 DIAGNOSIS — I5032 Chronic diastolic (congestive) heart failure: Secondary | ICD-10-CM | POA: Diagnosis not present

## 2021-02-13 DIAGNOSIS — N1831 Chronic kidney disease, stage 3a: Secondary | ICD-10-CM | POA: Diagnosis not present

## 2021-02-13 DIAGNOSIS — G4733 Obstructive sleep apnea (adult) (pediatric): Secondary | ICD-10-CM | POA: Diagnosis not present

## 2021-02-13 DIAGNOSIS — G2581 Restless legs syndrome: Secondary | ICD-10-CM | POA: Diagnosis not present

## 2021-02-13 DIAGNOSIS — K5730 Diverticulosis of large intestine without perforation or abscess without bleeding: Secondary | ICD-10-CM | POA: Diagnosis not present

## 2021-02-13 DIAGNOSIS — Z993 Dependence on wheelchair: Secondary | ICD-10-CM | POA: Diagnosis not present

## 2021-02-13 DIAGNOSIS — J302 Other seasonal allergic rhinitis: Secondary | ICD-10-CM | POA: Diagnosis not present

## 2021-02-13 DIAGNOSIS — E782 Mixed hyperlipidemia: Secondary | ICD-10-CM | POA: Diagnosis not present

## 2021-02-13 DIAGNOSIS — Z79891 Long term (current) use of opiate analgesic: Secondary | ICD-10-CM | POA: Diagnosis not present

## 2021-02-13 DIAGNOSIS — Z8616 Personal history of COVID-19: Secondary | ICD-10-CM | POA: Diagnosis not present

## 2021-02-13 DIAGNOSIS — Z87440 Personal history of urinary (tract) infections: Secondary | ICD-10-CM | POA: Diagnosis not present

## 2021-02-13 DIAGNOSIS — G629 Polyneuropathy, unspecified: Secondary | ICD-10-CM | POA: Diagnosis not present

## 2021-02-13 DIAGNOSIS — I130 Hypertensive heart and chronic kidney disease with heart failure and stage 1 through stage 4 chronic kidney disease, or unspecified chronic kidney disease: Secondary | ICD-10-CM | POA: Diagnosis not present

## 2021-02-13 DIAGNOSIS — M1990 Unspecified osteoarthritis, unspecified site: Secondary | ICD-10-CM | POA: Diagnosis not present

## 2021-02-13 DIAGNOSIS — K219 Gastro-esophageal reflux disease without esophagitis: Secondary | ICD-10-CM | POA: Diagnosis not present

## 2021-02-13 DIAGNOSIS — Z9181 History of falling: Secondary | ICD-10-CM | POA: Diagnosis not present

## 2021-02-13 DIAGNOSIS — I480 Paroxysmal atrial fibrillation: Secondary | ICD-10-CM | POA: Diagnosis not present

## 2021-02-13 DIAGNOSIS — G20 Parkinson's disease: Secondary | ICD-10-CM | POA: Diagnosis not present

## 2021-02-13 DIAGNOSIS — H409 Unspecified glaucoma: Secondary | ICD-10-CM | POA: Diagnosis not present

## 2021-02-19 DIAGNOSIS — I130 Hypertensive heart and chronic kidney disease with heart failure and stage 1 through stage 4 chronic kidney disease, or unspecified chronic kidney disease: Secondary | ICD-10-CM | POA: Diagnosis not present

## 2021-02-19 DIAGNOSIS — G2581 Restless legs syndrome: Secondary | ICD-10-CM | POA: Diagnosis not present

## 2021-02-19 DIAGNOSIS — G629 Polyneuropathy, unspecified: Secondary | ICD-10-CM | POA: Diagnosis not present

## 2021-02-19 DIAGNOSIS — H409 Unspecified glaucoma: Secondary | ICD-10-CM | POA: Diagnosis not present

## 2021-02-19 DIAGNOSIS — Z87440 Personal history of urinary (tract) infections: Secondary | ICD-10-CM | POA: Diagnosis not present

## 2021-02-19 DIAGNOSIS — Z9181 History of falling: Secondary | ICD-10-CM | POA: Diagnosis not present

## 2021-02-19 DIAGNOSIS — Z8616 Personal history of COVID-19: Secondary | ICD-10-CM | POA: Diagnosis not present

## 2021-02-19 DIAGNOSIS — I5032 Chronic diastolic (congestive) heart failure: Secondary | ICD-10-CM | POA: Diagnosis not present

## 2021-02-19 DIAGNOSIS — N1831 Chronic kidney disease, stage 3a: Secondary | ICD-10-CM | POA: Diagnosis not present

## 2021-02-19 DIAGNOSIS — Z79891 Long term (current) use of opiate analgesic: Secondary | ICD-10-CM | POA: Diagnosis not present

## 2021-02-19 DIAGNOSIS — E782 Mixed hyperlipidemia: Secondary | ICD-10-CM | POA: Diagnosis not present

## 2021-02-19 DIAGNOSIS — G20 Parkinson's disease: Secondary | ICD-10-CM | POA: Diagnosis not present

## 2021-02-19 DIAGNOSIS — I480 Paroxysmal atrial fibrillation: Secondary | ICD-10-CM | POA: Diagnosis not present

## 2021-02-19 DIAGNOSIS — M1990 Unspecified osteoarthritis, unspecified site: Secondary | ICD-10-CM | POA: Diagnosis not present

## 2021-02-19 DIAGNOSIS — J302 Other seasonal allergic rhinitis: Secondary | ICD-10-CM | POA: Diagnosis not present

## 2021-02-19 DIAGNOSIS — K5730 Diverticulosis of large intestine without perforation or abscess without bleeding: Secondary | ICD-10-CM | POA: Diagnosis not present

## 2021-02-19 DIAGNOSIS — K219 Gastro-esophageal reflux disease without esophagitis: Secondary | ICD-10-CM | POA: Diagnosis not present

## 2021-02-19 DIAGNOSIS — G4733 Obstructive sleep apnea (adult) (pediatric): Secondary | ICD-10-CM | POA: Diagnosis not present

## 2021-02-19 DIAGNOSIS — M4645 Discitis, unspecified, thoracolumbar region: Secondary | ICD-10-CM | POA: Diagnosis not present

## 2021-02-19 DIAGNOSIS — G231 Progressive supranuclear ophthalmoplegia [Steele-Richardson-Olszewski]: Secondary | ICD-10-CM | POA: Diagnosis not present

## 2021-02-19 DIAGNOSIS — Z993 Dependence on wheelchair: Secondary | ICD-10-CM | POA: Diagnosis not present

## 2021-02-24 DIAGNOSIS — G9009 Other idiopathic peripheral autonomic neuropathy: Secondary | ICD-10-CM | POA: Diagnosis not present

## 2021-02-24 DIAGNOSIS — G2581 Restless legs syndrome: Secondary | ICD-10-CM | POA: Diagnosis not present

## 2021-02-24 DIAGNOSIS — G231 Progressive supranuclear ophthalmoplegia [Steele-Richardson-Olszewski]: Secondary | ICD-10-CM | POA: Diagnosis not present

## 2021-02-25 DIAGNOSIS — G629 Polyneuropathy, unspecified: Secondary | ICD-10-CM | POA: Diagnosis not present

## 2021-02-25 DIAGNOSIS — G231 Progressive supranuclear ophthalmoplegia [Steele-Richardson-Olszewski]: Secondary | ICD-10-CM | POA: Diagnosis not present

## 2021-03-09 DIAGNOSIS — I130 Hypertensive heart and chronic kidney disease with heart failure and stage 1 through stage 4 chronic kidney disease, or unspecified chronic kidney disease: Secondary | ICD-10-CM | POA: Diagnosis not present

## 2021-03-09 DIAGNOSIS — Z87440 Personal history of urinary (tract) infections: Secondary | ICD-10-CM | POA: Diagnosis not present

## 2021-03-09 DIAGNOSIS — G20 Parkinson's disease: Secondary | ICD-10-CM | POA: Diagnosis not present

## 2021-03-09 DIAGNOSIS — G4733 Obstructive sleep apnea (adult) (pediatric): Secondary | ICD-10-CM | POA: Diagnosis not present

## 2021-03-09 DIAGNOSIS — Z79891 Long term (current) use of opiate analgesic: Secondary | ICD-10-CM | POA: Diagnosis not present

## 2021-03-09 DIAGNOSIS — K219 Gastro-esophageal reflux disease without esophagitis: Secondary | ICD-10-CM | POA: Diagnosis not present

## 2021-03-09 DIAGNOSIS — I480 Paroxysmal atrial fibrillation: Secondary | ICD-10-CM | POA: Diagnosis not present

## 2021-03-09 DIAGNOSIS — Z8616 Personal history of COVID-19: Secondary | ICD-10-CM | POA: Diagnosis not present

## 2021-03-09 DIAGNOSIS — M4645 Discitis, unspecified, thoracolumbar region: Secondary | ICD-10-CM | POA: Diagnosis not present

## 2021-03-09 DIAGNOSIS — I5032 Chronic diastolic (congestive) heart failure: Secondary | ICD-10-CM | POA: Diagnosis not present

## 2021-03-09 DIAGNOSIS — M1990 Unspecified osteoarthritis, unspecified site: Secondary | ICD-10-CM | POA: Diagnosis not present

## 2021-03-09 DIAGNOSIS — Z993 Dependence on wheelchair: Secondary | ICD-10-CM | POA: Diagnosis not present

## 2021-03-09 DIAGNOSIS — G629 Polyneuropathy, unspecified: Secondary | ICD-10-CM | POA: Diagnosis not present

## 2021-03-09 DIAGNOSIS — G2581 Restless legs syndrome: Secondary | ICD-10-CM | POA: Diagnosis not present

## 2021-03-09 DIAGNOSIS — Z9181 History of falling: Secondary | ICD-10-CM | POA: Diagnosis not present

## 2021-03-09 DIAGNOSIS — K5730 Diverticulosis of large intestine without perforation or abscess without bleeding: Secondary | ICD-10-CM | POA: Diagnosis not present

## 2021-03-09 DIAGNOSIS — N1831 Chronic kidney disease, stage 3a: Secondary | ICD-10-CM | POA: Diagnosis not present

## 2021-03-09 DIAGNOSIS — J302 Other seasonal allergic rhinitis: Secondary | ICD-10-CM | POA: Diagnosis not present

## 2021-03-09 DIAGNOSIS — E782 Mixed hyperlipidemia: Secondary | ICD-10-CM | POA: Diagnosis not present

## 2021-03-09 DIAGNOSIS — G231 Progressive supranuclear ophthalmoplegia [Steele-Richardson-Olszewski]: Secondary | ICD-10-CM | POA: Diagnosis not present

## 2021-03-09 DIAGNOSIS — H409 Unspecified glaucoma: Secondary | ICD-10-CM | POA: Diagnosis not present

## 2021-03-17 NOTE — Progress Notes (Signed)
Assessment/Plan:   1.  PSP  -Patient understands differences between PSP and Parkinson's disease.  Understands that there is an increased risk of falls, aspiration as subsequent morbidity and mortality from this disease compared to Parkinson's disease.             -Continue carbidopa/levodopa 25/100, 2 tablets 3 times per day.  We previously did levodopa challenge test and found levodopa only mildly beneficial.  However, the patient thinks it is helpful so we have decided to continue it. 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  -Patient really is depressed because of living situation.  Had to move into [place] after her husband died.  She does not like it.  I really wish the patient could engage with our atypical support group, but Nanine Means cannot bring her anywhere except Tuesdays and Thursdays and we meet on Wednesdays.  Had her meet with my social worker today to see if Nanine Means could possibly link her up via virtual      2.  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  issue now resolved   3.  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  She is pain-free right now, which is marked improvement. Subjective:   [name] was seen today in follow up for PSP.  My previous records were reviewed prior to todays visit as well as outside records available to me.  Pt with daughter who supplements hx.  Other daughter on phone.  Told her to stop rasagiline last visit that was started by Dr. Trula Ore.  I did not think it was helping and she had also been hospitalized for confusion.  A few times after our last visit, both in March, she was in the hospital for falls.  She is no longer following with Dr. Trula Ore.  She was following with [facility] for her chronic pain.  Pt now  at [facility].  Not having diplopia.  Does not like her living situation.  Participating some with activities, but really cannot get around well because cannot propel herself in the wheelchair.  She is working on Immunologist how to use a motorized wheelchair in the nursing facility.  That should give her some freedom.  Current prescribed movement disorder medications: Carbidopa/levodopa 25/100, 1 tablet 6 times daily (instead of 2 tablets 3 times daily to see if it helped for facial dyskinesia)   PREVIOUS MEDICATIONS: Rasagiline (confusion)  ALLERGIES:   Allergies  Allergen Reactions   Lyrica [Pregabalin] Other (See Comments)    Felt loopy   Zocor [Simvastatin] Other (See Comments)    Myalgias   Relafen [Nabumetone] Rash    CURRENT MEDICATIONS:  Outpatient Encounter Medications as of 03/19/2021  Medication Sig   acetaminophen (TYLENOL 8 HOUR) 650 MG CR tablet Take 1 tablet (650 mg total) by mouth every 8 (eight) hours as needed for pain.   carbidopa-levodopa (SINEMET IR) 25-100 MG tablet Take 2 tablets by mouth 3 (three) times daily.   Cholecalciferol 50 MCG (2000 UT) CAPS Take 4,000 Units by mouth daily.   DULoxetine (CYMBALTA) 60 MG capsule Take 60 mg by mouth 2 (two) times daily with a meal.   flecainide (TAMBOCOR) 50 MG tablet TAKE  1 TABLET(50 MG) BY MOUTH TWICE DAILY (Patient taking differently: Take 50 mg by mouth 2 (two) times daily.)   furosemide (LASIX) 20 MG tablet TAKE 1 TABLET(20 MG) BY MOUTH DAILY (Patient taking differently: Take 20 mg by mouth daily.)   gabapentin (NEURONTIN) 300 MG capsule Take 300-600 mg by mouth See admin instructions. Take one capsule (300 mg) by mouth daily with lunch and supper, and take two capsules (600 mg) at bedtime.   Menthol, Topical Analgesic, (BIOFREEZE EX) Apply 1 application topically daily as needed (pain).    metoprolol succinate (TOPROL-XL) 25 MG 24 hr tablet Take 12.5 mg by mouth daily.   NON FORMULARY Gaba 750mg  take 1 tab once a day    pantoprazole (PROTONIX) 40 MG tablet Take 40 mg by mouth daily.    polyethylene glycol (MIRALAX / GLYCOLAX) packet Take 17 g by mouth daily. (Patient taking differently: Take 17 g by mouth daily as needed for mild constipation.)   potassium chloride SA (K-DUR) 20 MEQ tablet Take 1 tablet (20 mEq total) by mouth daily.   pravastatin (PRAVACHOL) 40 MG tablet Take 40 mg by mouth daily with lunch.   TRAVATAN Z 0.004 % SOLN ophthalmic solution Place 1 drop into both eyes at bedtime.    No facility-administered encounter medications on file as of 03/19/2021.    Objective:   PHYSICAL EXAMINATION:    VITALS:   Vitals:   03/19/21 1128  BP: (!) 132/59  Pulse: 82  SpO2: 94%  Weight: 204 lb (92.5 kg)  Height: 5\' 7"  (1.702 m)    GEN:  The patient appears stated age and is in NAD. HEENT:  Normocephalic, atraumatic.  The mucous membranes are moist. The superficial temporal arteries are without ropiness or tenderness. CV:  RRR Lungs:  CTAB Neck/HEME:  There are no carotid bruits bilaterally.  Neurological examination:  Orientation: The patient is alert and oriented x3. Cranial nerves: There is good facial symmetry with facial hypomimia.  She does have some square wave jerks.  The speech is fluent and clear. Soft palate rises symmetrically and there is no tongue deviation. Hearing is intact to conversational tone. Sensation: Sensation is intact to light touch throughout Motor: Strength is at least antigravity x4.  Movement examination: Tone: There is normal tone in the upper extremities Abnormal movements: mild head dyskinesia Coordination:  There is mild decremation with RAM's     I have reviewed and interpreted the following labs independently    Chemistry      Component Value Date/Time   NA 140 08/16/2020 1715   K 4.3 08/16/2020 1715   CL 101 08/16/2020 1715   CO2 31 08/16/2020 1715   BUN 19 08/16/2020 1715   CREATININE 0.98 08/16/2020 1715   CREATININE 0.93 01/11/2019 1547       Component Value Date/Time   CALCIUM 9.5 08/16/2020 1715   ALKPHOS 94 06/07/2020 0359   AST 20 06/07/2020 0359   ALT 10 06/07/2020 0359   BILITOT 0.3 06/07/2020 0359       Lab Results  Component Value Date   WBC 7.8 08/16/2020   HGB 13.5 08/16/2020   HCT 43.7 08/16/2020   MCV 92.4 08/16/2020   PLT 190 08/16/2020    Lab Results  Component Value Date   TSH 1.298 12/25/2019     Total time spent on today's visit was 40 minutes, including both face-to-face time and nonface-to-face time.  Time included that spent on review of records (prior notes available to me/labs/imaging if pertinent),  discussing treatment and goals, answering patient's questions and coordinating care.  Cc:  [name], MD

## 2021-03-19 ENCOUNTER — Ambulatory Visit: Payer: HMO | Admitting: Neurology

## 2021-03-19 ENCOUNTER — Encounter: Payer: Self-pay | Admitting: Neurology

## 2021-03-19 ENCOUNTER — Other Ambulatory Visit: Payer: Self-pay

## 2021-03-19 VITALS — BP 132/59 | HR 82 | Ht 67.0 in | Wt 204.0 lb

## 2021-03-19 DIAGNOSIS — F4321 Adjustment disorder with depressed mood: Secondary | ICD-10-CM

## 2021-03-19 DIAGNOSIS — G231 Progressive supranuclear ophthalmoplegia [Steele-Richardson-Olszewski]: Secondary | ICD-10-CM | POA: Diagnosis not present

## 2021-03-26 DIAGNOSIS — G9009 Other idiopathic peripheral autonomic neuropathy: Secondary | ICD-10-CM | POA: Diagnosis not present

## 2021-03-26 DIAGNOSIS — G2581 Restless legs syndrome: Secondary | ICD-10-CM | POA: Diagnosis not present

## 2021-03-26 DIAGNOSIS — G231 Progressive supranuclear ophthalmoplegia [Steele-Richardson-Olszewski]: Secondary | ICD-10-CM | POA: Diagnosis not present

## 2021-03-27 DIAGNOSIS — G231 Progressive supranuclear ophthalmoplegia [Steele-Richardson-Olszewski]: Secondary | ICD-10-CM | POA: Diagnosis not present

## 2021-03-27 DIAGNOSIS — G629 Polyneuropathy, unspecified: Secondary | ICD-10-CM | POA: Diagnosis not present

## 2021-04-10 DIAGNOSIS — G4733 Obstructive sleep apnea (adult) (pediatric): Secondary | ICD-10-CM | POA: Diagnosis not present

## 2021-04-10 DIAGNOSIS — N1831 Chronic kidney disease, stage 3a: Secondary | ICD-10-CM | POA: Diagnosis not present

## 2021-04-10 DIAGNOSIS — Z993 Dependence on wheelchair: Secondary | ICD-10-CM | POA: Diagnosis not present

## 2021-04-10 DIAGNOSIS — G20 Parkinson's disease: Secondary | ICD-10-CM | POA: Diagnosis not present

## 2021-04-10 DIAGNOSIS — E782 Mixed hyperlipidemia: Secondary | ICD-10-CM | POA: Diagnosis not present

## 2021-04-10 DIAGNOSIS — H409 Unspecified glaucoma: Secondary | ICD-10-CM | POA: Diagnosis not present

## 2021-04-10 DIAGNOSIS — G231 Progressive supranuclear ophthalmoplegia [Steele-Richardson-Olszewski]: Secondary | ICD-10-CM | POA: Diagnosis not present

## 2021-04-10 DIAGNOSIS — I130 Hypertensive heart and chronic kidney disease with heart failure and stage 1 through stage 4 chronic kidney disease, or unspecified chronic kidney disease: Secondary | ICD-10-CM | POA: Diagnosis not present

## 2021-04-10 DIAGNOSIS — K219 Gastro-esophageal reflux disease without esophagitis: Secondary | ICD-10-CM | POA: Diagnosis not present

## 2021-04-10 DIAGNOSIS — I5032 Chronic diastolic (congestive) heart failure: Secondary | ICD-10-CM | POA: Diagnosis not present

## 2021-04-10 DIAGNOSIS — K5730 Diverticulosis of large intestine without perforation or abscess without bleeding: Secondary | ICD-10-CM | POA: Diagnosis not present

## 2021-04-10 DIAGNOSIS — Z79891 Long term (current) use of opiate analgesic: Secondary | ICD-10-CM | POA: Diagnosis not present

## 2021-04-10 DIAGNOSIS — I480 Paroxysmal atrial fibrillation: Secondary | ICD-10-CM | POA: Diagnosis not present

## 2021-04-10 DIAGNOSIS — Z8616 Personal history of COVID-19: Secondary | ICD-10-CM | POA: Diagnosis not present

## 2021-04-10 DIAGNOSIS — M1990 Unspecified osteoarthritis, unspecified site: Secondary | ICD-10-CM | POA: Diagnosis not present

## 2021-04-10 DIAGNOSIS — J302 Other seasonal allergic rhinitis: Secondary | ICD-10-CM | POA: Diagnosis not present

## 2021-04-10 DIAGNOSIS — G2581 Restless legs syndrome: Secondary | ICD-10-CM | POA: Diagnosis not present

## 2021-04-10 DIAGNOSIS — Z87440 Personal history of urinary (tract) infections: Secondary | ICD-10-CM | POA: Diagnosis not present

## 2021-04-10 DIAGNOSIS — M4645 Discitis, unspecified, thoracolumbar region: Secondary | ICD-10-CM | POA: Diagnosis not present

## 2021-04-10 DIAGNOSIS — Z9181 History of falling: Secondary | ICD-10-CM | POA: Diagnosis not present

## 2021-04-10 DIAGNOSIS — G629 Polyneuropathy, unspecified: Secondary | ICD-10-CM | POA: Diagnosis not present

## 2021-04-26 DIAGNOSIS — G2581 Restless legs syndrome: Secondary | ICD-10-CM | POA: Diagnosis not present

## 2021-04-26 DIAGNOSIS — G9009 Other idiopathic peripheral autonomic neuropathy: Secondary | ICD-10-CM | POA: Diagnosis not present

## 2021-04-26 DIAGNOSIS — G231 Progressive supranuclear ophthalmoplegia [Steele-Richardson-Olszewski]: Secondary | ICD-10-CM | POA: Diagnosis not present

## 2021-04-27 DIAGNOSIS — G629 Polyneuropathy, unspecified: Secondary | ICD-10-CM | POA: Diagnosis not present

## 2021-04-27 DIAGNOSIS — G231 Progressive supranuclear ophthalmoplegia [Steele-Richardson-Olszewski]: Secondary | ICD-10-CM | POA: Diagnosis not present

## 2021-05-04 DIAGNOSIS — I5032 Chronic diastolic (congestive) heart failure: Secondary | ICD-10-CM | POA: Diagnosis not present

## 2021-05-04 DIAGNOSIS — I130 Hypertensive heart and chronic kidney disease with heart failure and stage 1 through stage 4 chronic kidney disease, or unspecified chronic kidney disease: Secondary | ICD-10-CM | POA: Diagnosis not present

## 2021-05-04 DIAGNOSIS — G231 Progressive supranuclear ophthalmoplegia [Steele-Richardson-Olszewski]: Secondary | ICD-10-CM | POA: Diagnosis not present

## 2021-05-05 DIAGNOSIS — G231 Progressive supranuclear ophthalmoplegia [Steele-Richardson-Olszewski]: Secondary | ICD-10-CM | POA: Diagnosis not present

## 2021-05-05 DIAGNOSIS — I509 Heart failure, unspecified: Secondary | ICD-10-CM | POA: Diagnosis not present

## 2021-05-05 DIAGNOSIS — I480 Paroxysmal atrial fibrillation: Secondary | ICD-10-CM | POA: Diagnosis not present

## 2021-05-05 DIAGNOSIS — E782 Mixed hyperlipidemia: Secondary | ICD-10-CM | POA: Diagnosis not present

## 2021-05-05 DIAGNOSIS — E876 Hypokalemia: Secondary | ICD-10-CM | POA: Diagnosis not present

## 2021-05-05 DIAGNOSIS — I110 Hypertensive heart disease with heart failure: Secondary | ICD-10-CM | POA: Diagnosis not present

## 2021-05-05 DIAGNOSIS — W19XXXA Unspecified fall, initial encounter: Secondary | ICD-10-CM | POA: Diagnosis not present

## 2021-05-07 DIAGNOSIS — E782 Mixed hyperlipidemia: Secondary | ICD-10-CM | POA: Diagnosis not present

## 2021-05-07 DIAGNOSIS — K219 Gastro-esophageal reflux disease without esophagitis: Secondary | ICD-10-CM | POA: Diagnosis not present

## 2021-05-07 DIAGNOSIS — G629 Polyneuropathy, unspecified: Secondary | ICD-10-CM | POA: Diagnosis not present

## 2021-05-07 DIAGNOSIS — Z8616 Personal history of COVID-19: Secondary | ICD-10-CM | POA: Diagnosis not present

## 2021-05-07 DIAGNOSIS — N1831 Chronic kidney disease, stage 3a: Secondary | ICD-10-CM | POA: Diagnosis not present

## 2021-05-07 DIAGNOSIS — Z993 Dependence on wheelchair: Secondary | ICD-10-CM | POA: Diagnosis not present

## 2021-05-07 DIAGNOSIS — Z79891 Long term (current) use of opiate analgesic: Secondary | ICD-10-CM | POA: Diagnosis not present

## 2021-05-07 DIAGNOSIS — K5730 Diverticulosis of large intestine without perforation or abscess without bleeding: Secondary | ICD-10-CM | POA: Diagnosis not present

## 2021-05-07 DIAGNOSIS — G4733 Obstructive sleep apnea (adult) (pediatric): Secondary | ICD-10-CM | POA: Diagnosis not present

## 2021-05-07 DIAGNOSIS — I5032 Chronic diastolic (congestive) heart failure: Secondary | ICD-10-CM | POA: Diagnosis not present

## 2021-05-07 DIAGNOSIS — M1990 Unspecified osteoarthritis, unspecified site: Secondary | ICD-10-CM | POA: Diagnosis not present

## 2021-05-07 DIAGNOSIS — G231 Progressive supranuclear ophthalmoplegia [Steele-Richardson-Olszewski]: Secondary | ICD-10-CM | POA: Diagnosis not present

## 2021-05-07 DIAGNOSIS — G20 Parkinson's disease: Secondary | ICD-10-CM | POA: Diagnosis not present

## 2021-05-07 DIAGNOSIS — H409 Unspecified glaucoma: Secondary | ICD-10-CM | POA: Diagnosis not present

## 2021-05-07 DIAGNOSIS — I480 Paroxysmal atrial fibrillation: Secondary | ICD-10-CM | POA: Diagnosis not present

## 2021-05-07 DIAGNOSIS — I130 Hypertensive heart and chronic kidney disease with heart failure and stage 1 through stage 4 chronic kidney disease, or unspecified chronic kidney disease: Secondary | ICD-10-CM | POA: Diagnosis not present

## 2021-05-07 DIAGNOSIS — M4645 Discitis, unspecified, thoracolumbar region: Secondary | ICD-10-CM | POA: Diagnosis not present

## 2021-05-07 DIAGNOSIS — G2581 Restless legs syndrome: Secondary | ICD-10-CM | POA: Diagnosis not present

## 2021-05-07 DIAGNOSIS — Z9181 History of falling: Secondary | ICD-10-CM | POA: Diagnosis not present

## 2021-05-07 DIAGNOSIS — J302 Other seasonal allergic rhinitis: Secondary | ICD-10-CM | POA: Diagnosis not present

## 2021-05-07 DIAGNOSIS — Z87440 Personal history of urinary (tract) infections: Secondary | ICD-10-CM | POA: Diagnosis not present

## 2021-05-12 ENCOUNTER — Encounter: Payer: Self-pay | Admitting: Dermatology

## 2021-05-12 ENCOUNTER — Other Ambulatory Visit: Payer: Self-pay

## 2021-05-12 ENCOUNTER — Ambulatory Visit: Payer: HMO | Admitting: Dermatology

## 2021-05-12 DIAGNOSIS — Z1283 Encounter for screening for malignant neoplasm of skin: Secondary | ICD-10-CM | POA: Diagnosis not present

## 2021-05-12 DIAGNOSIS — D1801 Hemangioma of skin and subcutaneous tissue: Secondary | ICD-10-CM | POA: Diagnosis not present

## 2021-05-12 DIAGNOSIS — L97909 Non-pressure chronic ulcer of unspecified part of unspecified lower leg with unspecified severity: Secondary | ICD-10-CM

## 2021-05-12 DIAGNOSIS — L97921 Non-pressure chronic ulcer of unspecified part of left lower leg limited to breakdown of skin: Secondary | ICD-10-CM

## 2021-05-12 DIAGNOSIS — L570 Actinic keratosis: Secondary | ICD-10-CM

## 2021-05-12 DIAGNOSIS — L821 Other seborrheic keratosis: Secondary | ICD-10-CM

## 2021-05-12 DIAGNOSIS — L304 Erythema intertrigo: Secondary | ICD-10-CM | POA: Diagnosis not present

## 2021-05-12 DIAGNOSIS — I83009 Varicose veins of unspecified lower extremity with ulcer of unspecified site: Secondary | ICD-10-CM

## 2021-05-19 DIAGNOSIS — M5416 Radiculopathy, lumbar region: Secondary | ICD-10-CM | POA: Diagnosis not present

## 2021-05-19 DIAGNOSIS — M4325 Fusion of spine, thoracolumbar region: Secondary | ICD-10-CM | POA: Diagnosis not present

## 2021-05-19 DIAGNOSIS — M48061 Spinal stenosis, lumbar region without neurogenic claudication: Secondary | ICD-10-CM | POA: Diagnosis not present

## 2021-05-26 DIAGNOSIS — G231 Progressive supranuclear ophthalmoplegia [Steele-Richardson-Olszewski]: Secondary | ICD-10-CM | POA: Diagnosis not present

## 2021-05-26 DIAGNOSIS — G2581 Restless legs syndrome: Secondary | ICD-10-CM | POA: Diagnosis not present

## 2021-05-26 DIAGNOSIS — G9009 Other idiopathic peripheral autonomic neuropathy: Secondary | ICD-10-CM | POA: Diagnosis not present

## 2021-05-27 DIAGNOSIS — G231 Progressive supranuclear ophthalmoplegia [Steele-Richardson-Olszewski]: Secondary | ICD-10-CM | POA: Diagnosis not present

## 2021-05-27 DIAGNOSIS — G629 Polyneuropathy, unspecified: Secondary | ICD-10-CM | POA: Diagnosis not present

## 2021-05-29 DIAGNOSIS — L89613 Pressure ulcer of right heel, stage 3: Secondary | ICD-10-CM | POA: Diagnosis not present

## 2021-05-29 DIAGNOSIS — I480 Paroxysmal atrial fibrillation: Secondary | ICD-10-CM | POA: Diagnosis not present

## 2021-05-29 DIAGNOSIS — I130 Hypertensive heart and chronic kidney disease with heart failure and stage 1 through stage 4 chronic kidney disease, or unspecified chronic kidney disease: Secondary | ICD-10-CM | POA: Diagnosis not present

## 2021-05-29 DIAGNOSIS — G20 Parkinson's disease: Secondary | ICD-10-CM | POA: Diagnosis not present

## 2021-05-29 DIAGNOSIS — G231 Progressive supranuclear ophthalmoplegia [Steele-Richardson-Olszewski]: Secondary | ICD-10-CM | POA: Diagnosis not present

## 2021-05-29 DIAGNOSIS — N1831 Chronic kidney disease, stage 3a: Secondary | ICD-10-CM | POA: Diagnosis not present

## 2021-05-29 DIAGNOSIS — M4800 Spinal stenosis, site unspecified: Secondary | ICD-10-CM | POA: Diagnosis not present

## 2021-05-29 DIAGNOSIS — E876 Hypokalemia: Secondary | ICD-10-CM | POA: Diagnosis not present

## 2021-05-29 DIAGNOSIS — G4733 Obstructive sleep apnea (adult) (pediatric): Secondary | ICD-10-CM | POA: Diagnosis not present

## 2021-05-29 DIAGNOSIS — Z8616 Personal history of COVID-19: Secondary | ICD-10-CM | POA: Diagnosis not present

## 2021-05-29 DIAGNOSIS — E782 Mixed hyperlipidemia: Secondary | ICD-10-CM | POA: Diagnosis not present

## 2021-05-29 DIAGNOSIS — Z87440 Personal history of urinary (tract) infections: Secondary | ICD-10-CM | POA: Diagnosis not present

## 2021-05-29 DIAGNOSIS — I5032 Chronic diastolic (congestive) heart failure: Secondary | ICD-10-CM | POA: Diagnosis not present

## 2021-05-31 ENCOUNTER — Encounter: Payer: Self-pay | Admitting: Dermatology

## 2021-05-31 NOTE — Progress Notes (Signed)
° °  Follow-Up Visit   Subjective  [name] is a 80 y.o. female who presents for the following: Skin Problem (Upper lip peeling x months no bleeding. Treatment over the counter chap stick, under the breast irritation using powder currently at [place] daughter Katharine Look is with her today at the visit. ).  Multiple areas of skin concern including peeling above lip, rash under breasts, sore on left leg Location:  Duration:  Quality:  Associated Signs/Symptoms: Modifying Factors:  Severity:  Timing: Context:   Objective  Well appearing patient in no apparent distress; mood and affect are within normal limits. Mid Back Multiple tan textured flattopped papules.  Mid Back Multiple smooth red 1 to 2 mm dermal papules  Mid Back Full body skin exam.  No atypical pigmented lesions or nonmelanoma skin cancer.  Left Upper Cutaneous Lip  gritty 4 mm r millimeter pink crust  Left Inframammary Fold, Right Inframammary Fold Erythema without pustules or satellite lesions; compatible with irritant dermatitis  Left Lower Leg - Anterior 9 mm hemorrhagic crust without palpable margin, historically improving       A full examination was performed including scalp, head, eyes, ears, nose, lips, neck, chest, axillae, abdomen, back, buttocks, bilateral upper extremities, bilateral lower extremities, hands, feet, fingers, toes, fingernails, and toenails. All findings within normal limits unless otherwise noted below.  Areas beneath undergarments not fully examined   Assessment & Plan    Seborrheic keratosis Mid Back  Benign no treatment needed if stable  Hemangioma of skin Mid Back  Benign no treatment needed.   Screening for malignant neoplasm of skin Mid Back  Yearly skin exam.   AK (actinic keratosis) Left Upper Cutaneous Lip  Destruction of lesion - Left Upper Cutaneous Lip Complexity: simple   Destruction method: cryotherapy   Informed consent: discussed and  consent obtained   Timeout:  patient name, date of birth, surgical site, and procedure verified Lesion destroyed using liquid nitrogen: Yes   Cryotherapy cycles:  3 Outcome: patient tolerated procedure well with no complications   Post-procedure details: wound care instructions given    Erythema intertrigo Left Inframammary Fold; Right Inframammary Fold  Patient is using powder and doing better. We will keep using that. If condition gets worse then we can add  topical prescription.  Venous ulcer ([place]) Left Lower Leg - Anterior  Follow up in 2 months.      I, Lavonna Monarch, MD, have reviewed all documentation for this visit.  The documentation on 05/31/21 for the exam, diagnosis, procedures, and orders are all accurate and complete.

## 2021-06-02 DIAGNOSIS — I480 Paroxysmal atrial fibrillation: Secondary | ICD-10-CM | POA: Diagnosis not present

## 2021-06-02 DIAGNOSIS — I509 Heart failure, unspecified: Secondary | ICD-10-CM | POA: Diagnosis not present

## 2021-06-02 DIAGNOSIS — E876 Hypokalemia: Secondary | ICD-10-CM | POA: Diagnosis not present

## 2021-06-02 DIAGNOSIS — I130 Hypertensive heart and chronic kidney disease with heart failure and stage 1 through stage 4 chronic kidney disease, or unspecified chronic kidney disease: Secondary | ICD-10-CM | POA: Diagnosis not present

## 2021-06-02 DIAGNOSIS — E782 Mixed hyperlipidemia: Secondary | ICD-10-CM | POA: Diagnosis not present

## 2021-06-12 DIAGNOSIS — M5416 Radiculopathy, lumbar region: Secondary | ICD-10-CM | POA: Diagnosis not present

## 2021-06-19 DIAGNOSIS — Z8616 Personal history of COVID-19: Secondary | ICD-10-CM | POA: Diagnosis not present

## 2021-06-19 DIAGNOSIS — G4733 Obstructive sleep apnea (adult) (pediatric): Secondary | ICD-10-CM | POA: Diagnosis not present

## 2021-06-19 DIAGNOSIS — G20 Parkinson's disease: Secondary | ICD-10-CM | POA: Diagnosis not present

## 2021-06-19 DIAGNOSIS — I480 Paroxysmal atrial fibrillation: Secondary | ICD-10-CM | POA: Diagnosis not present

## 2021-06-19 DIAGNOSIS — E876 Hypokalemia: Secondary | ICD-10-CM | POA: Diagnosis not present

## 2021-06-19 DIAGNOSIS — Z87440 Personal history of urinary (tract) infections: Secondary | ICD-10-CM | POA: Diagnosis not present

## 2021-06-19 DIAGNOSIS — G231 Progressive supranuclear ophthalmoplegia [Steele-Richardson-Olszewski]: Secondary | ICD-10-CM | POA: Diagnosis not present

## 2021-06-19 DIAGNOSIS — E782 Mixed hyperlipidemia: Secondary | ICD-10-CM | POA: Diagnosis not present

## 2021-06-19 DIAGNOSIS — M4800 Spinal stenosis, site unspecified: Secondary | ICD-10-CM | POA: Diagnosis not present

## 2021-06-19 DIAGNOSIS — N1831 Chronic kidney disease, stage 3a: Secondary | ICD-10-CM | POA: Diagnosis not present

## 2021-06-19 DIAGNOSIS — I130 Hypertensive heart and chronic kidney disease with heart failure and stage 1 through stage 4 chronic kidney disease, or unspecified chronic kidney disease: Secondary | ICD-10-CM | POA: Diagnosis not present

## 2021-06-19 DIAGNOSIS — I5032 Chronic diastolic (congestive) heart failure: Secondary | ICD-10-CM | POA: Diagnosis not present

## 2021-06-19 DIAGNOSIS — L89613 Pressure ulcer of right heel, stage 3: Secondary | ICD-10-CM | POA: Diagnosis not present

## 2021-06-23 DIAGNOSIS — G4733 Obstructive sleep apnea (adult) (pediatric): Secondary | ICD-10-CM | POA: Diagnosis not present

## 2021-06-23 DIAGNOSIS — E782 Mixed hyperlipidemia: Secondary | ICD-10-CM | POA: Diagnosis not present

## 2021-06-23 DIAGNOSIS — I5032 Chronic diastolic (congestive) heart failure: Secondary | ICD-10-CM | POA: Diagnosis not present

## 2021-06-23 DIAGNOSIS — Z8616 Personal history of COVID-19: Secondary | ICD-10-CM | POA: Diagnosis not present

## 2021-06-23 DIAGNOSIS — I480 Paroxysmal atrial fibrillation: Secondary | ICD-10-CM | POA: Diagnosis not present

## 2021-06-23 DIAGNOSIS — M4800 Spinal stenosis, site unspecified: Secondary | ICD-10-CM | POA: Diagnosis not present

## 2021-06-23 DIAGNOSIS — G231 Progressive supranuclear ophthalmoplegia [Steele-Richardson-Olszewski]: Secondary | ICD-10-CM | POA: Diagnosis not present

## 2021-06-23 DIAGNOSIS — G20 Parkinson's disease: Secondary | ICD-10-CM | POA: Diagnosis not present

## 2021-06-23 DIAGNOSIS — L89613 Pressure ulcer of right heel, stage 3: Secondary | ICD-10-CM | POA: Diagnosis not present

## 2021-06-23 DIAGNOSIS — I130 Hypertensive heart and chronic kidney disease with heart failure and stage 1 through stage 4 chronic kidney disease, or unspecified chronic kidney disease: Secondary | ICD-10-CM | POA: Diagnosis not present

## 2021-06-23 DIAGNOSIS — Z87440 Personal history of urinary (tract) infections: Secondary | ICD-10-CM | POA: Diagnosis not present

## 2021-06-23 DIAGNOSIS — E876 Hypokalemia: Secondary | ICD-10-CM | POA: Diagnosis not present

## 2021-06-23 DIAGNOSIS — N1831 Chronic kidney disease, stage 3a: Secondary | ICD-10-CM | POA: Diagnosis not present

## 2021-06-26 DIAGNOSIS — G2581 Restless legs syndrome: Secondary | ICD-10-CM | POA: Diagnosis not present

## 2021-06-26 DIAGNOSIS — G9009 Other idiopathic peripheral autonomic neuropathy: Secondary | ICD-10-CM | POA: Diagnosis not present

## 2021-06-26 DIAGNOSIS — G231 Progressive supranuclear ophthalmoplegia [Steele-Richardson-Olszewski]: Secondary | ICD-10-CM | POA: Diagnosis not present

## 2021-06-27 DIAGNOSIS — G629 Polyneuropathy, unspecified: Secondary | ICD-10-CM | POA: Diagnosis not present

## 2021-06-27 DIAGNOSIS — G231 Progressive supranuclear ophthalmoplegia [Steele-Richardson-Olszewski]: Secondary | ICD-10-CM | POA: Diagnosis not present

## 2021-06-30 DIAGNOSIS — I509 Heart failure, unspecified: Secondary | ICD-10-CM | POA: Diagnosis not present

## 2021-06-30 DIAGNOSIS — I5032 Chronic diastolic (congestive) heart failure: Secondary | ICD-10-CM | POA: Diagnosis not present

## 2021-06-30 DIAGNOSIS — G231 Progressive supranuclear ophthalmoplegia [Steele-Richardson-Olszewski]: Secondary | ICD-10-CM | POA: Diagnosis not present

## 2021-06-30 DIAGNOSIS — E876 Hypokalemia: Secondary | ICD-10-CM | POA: Diagnosis not present

## 2021-06-30 DIAGNOSIS — E782 Mixed hyperlipidemia: Secondary | ICD-10-CM | POA: Diagnosis not present

## 2021-06-30 DIAGNOSIS — I480 Paroxysmal atrial fibrillation: Secondary | ICD-10-CM | POA: Diagnosis not present

## 2021-06-30 DIAGNOSIS — I130 Hypertensive heart and chronic kidney disease with heart failure and stage 1 through stage 4 chronic kidney disease, or unspecified chronic kidney disease: Secondary | ICD-10-CM | POA: Diagnosis not present

## 2021-07-01 DIAGNOSIS — I5032 Chronic diastolic (congestive) heart failure: Secondary | ICD-10-CM | POA: Diagnosis not present

## 2021-07-01 DIAGNOSIS — M4800 Spinal stenosis, site unspecified: Secondary | ICD-10-CM | POA: Diagnosis not present

## 2021-07-01 DIAGNOSIS — Z8616 Personal history of COVID-19: Secondary | ICD-10-CM | POA: Diagnosis not present

## 2021-07-01 DIAGNOSIS — E876 Hypokalemia: Secondary | ICD-10-CM | POA: Diagnosis not present

## 2021-07-01 DIAGNOSIS — L89613 Pressure ulcer of right heel, stage 3: Secondary | ICD-10-CM | POA: Diagnosis not present

## 2021-07-01 DIAGNOSIS — I130 Hypertensive heart and chronic kidney disease with heart failure and stage 1 through stage 4 chronic kidney disease, or unspecified chronic kidney disease: Secondary | ICD-10-CM | POA: Diagnosis not present

## 2021-07-01 DIAGNOSIS — G231 Progressive supranuclear ophthalmoplegia [Steele-Richardson-Olszewski]: Secondary | ICD-10-CM | POA: Diagnosis not present

## 2021-07-01 DIAGNOSIS — I480 Paroxysmal atrial fibrillation: Secondary | ICD-10-CM | POA: Diagnosis not present

## 2021-07-01 DIAGNOSIS — G4733 Obstructive sleep apnea (adult) (pediatric): Secondary | ICD-10-CM | POA: Diagnosis not present

## 2021-07-01 DIAGNOSIS — E782 Mixed hyperlipidemia: Secondary | ICD-10-CM | POA: Diagnosis not present

## 2021-07-01 DIAGNOSIS — N1831 Chronic kidney disease, stage 3a: Secondary | ICD-10-CM | POA: Diagnosis not present

## 2021-07-01 DIAGNOSIS — Z87440 Personal history of urinary (tract) infections: Secondary | ICD-10-CM | POA: Diagnosis not present

## 2021-07-01 DIAGNOSIS — G20 Parkinson's disease: Secondary | ICD-10-CM | POA: Diagnosis not present

## 2021-07-05 NOTE — Progress Notes (Deleted)
Office Visit    Patient Name: [name] Date of Encounter: 07/05/2021  PCP:  [name], [place]  Cardiologist:  Jenkins Rouge, MD  Advanced Practice Provider:  No care team member to display Electrophysiologist:  None   HPI    [name] is a 81 y.o. female with a hx of hypertension, OSA with intolerance to CPAP and oral appliance, hyperlipidemia, carotid disease, PAF on flecainide with prior anticoagulation with Coumadin and Xarelto (stopped due to falls), and progressive Parkinson's/PSP disorder presents today for annual follow-up appointment.  She had a remote cath in 2002 with normal coronaries.  Last Myoview in 2015.  She was admitted 6/29 to 12/18/2017 for discitis/osteomyelitis with concern of abscess versus old hematoma.  Status post total facetectomy, revision of all construct and new fusion construct of T9-L3 by neurosurgery.  She had had multiple ER visits since her last visit with [place] mainly for falls, anticoagulation was stopped.  She was last seen 03/03/2020 by telephone by Truitt Merle, NP.  She was doing well at that time without any chest pain.  She had had several falls with no serious injury.  She was recommended for an electric wheelchair and had to stop walking due to falls.  She does get therapy at her house.  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   GERD (gastroesophageal reflux disease)    Hyperlipidemia    Hypertension    Lumbar stenosis    OSA (obstructive sleep apnea)    "couldn't handle CPAP; use mouth guard some; not all the time" (01/06/2014)   PAF (paroxysmal atrial fibrillation) ([place])    Scoliosis    with radiculopathy L2-S1 with prior surgery   Small bowel obstruction ([place])     versus ileus after last bck surgery   Spondylosis    Past Surgical History:  Procedure Laterality Date   BACK SURGERY     CATARACT EXTRACTION W/ INTRAOCULAR LENS  IMPLANT, BILATERAL  2011   CHOLECYSTECTOMY N/A 07/07/2018   Procedure: LAPAROSCOPIC CHOLECYSTECTOMY;  Surgeon: Erroll Luna, MD;  Location: [facility];  Service: General;  Laterality: N/A;   COLONOSCOPY  12/09/2004   LIPOMA EXCISION  1980's   "fatty tumors"   [place]  02/2009   "ruptured disc"   NASAL SEPTUM SURGERY  80's   POSTERIOR LUMBAR FUSION  06/2010; 10/2011   "placed screws, rods, spacers both times"   POSTERIOR LUMBAR FUSION 4 LEVEL N/A 12/13/2018   Procedure: THORACIC NINE -LUMBAR THREE POSTERIOR INSTRUMENTATION FUSION;  Surgeon: Judith Part, MD;  Location: [facility];  Service: Neurosurgery;  Laterality: N/A;   REPAIR DURAL / CSF LEAK  02/2009   THORACIC LAMINECTOMY FOR EPIDURAL ABSCESS N/A 09/28/2018   Procedure: THORACIC NINE-THORACIC TEN, THORACIC TEN-THORACIC ELEVEN, THORACIC ELEVEN-THORACIC TWELVE LAMINECTOMIES FOR EPIDURAL ABSCESS;  Surgeon: Judith Part, MD;  Location: [facility];  Service: Neurosurgery;  Laterality: N/A;   TOTAL ABDOMINAL HYSTERECTOMY  02/1993    Allergies  Allergies  Allergen Reactions   Lyrica [Pregabalin] Other (See Comments)    Felt loopy   Zocor [Simvastatin] Other (See Comments)    Myalgias   Relafen [Nabumetone] Rash     EKGs/Labs/Other  Studies Reviewed:   The following studies were reviewed today:  12/08/2018 lower extremity venous ultrasound  Summary:  Right: There is no evidence of deep vein thrombosis in the lower  extremity. No cystic structure found in the popliteal fossa.  Left: There is no evidence of deep vein thrombosis in the lower extremity.  However, portions of this examination were limited- see technologist  comments above. No cystic structure found in the popliteal fossa. See  technical findings listed above     EKG:  EKG is *** ordered today.   The ekg ordered today demonstrates ***  Recent Labs: 08/16/2020: BUN 19; Creatinine, Ser 0.98; Hemoglobin 13.5; Platelets 190; Potassium 4.3; Sodium 140  Recent Lipid Panel    Component Value Date/Time   CHOL  12/22/2006 0550    183        ATP III CLASSIFICATION:  <200     mg/dL   Desirable  200-239  mg/dL   Borderline High  >=240    mg/dL   High   TRIG 174 (H) 12/22/2006 0550   HDL 47 12/22/2006 0550   CHOLHDL 3.9 12/22/2006 0550   VLDL 35 12/22/2006 0550   LDLCALC (H) 12/22/2006 0550    101        Total Cholesterol/HDL:CHD Risk Coronary Heart Disease Risk Table                     Men   Women  1/2 Average Risk   3.4   3.3    Risk Assessment/Calculations:  {Does this patient have ATRIAL FIBRILLATION?:[phone]}  Home Medications   No outpatient medications have been marked as taking for the 07/06/21 encounter (Appointment) with [name] Dopp T, PA-C.     Review of Systems   ***   All other systems reviewed and are otherwise negative except as noted above.  Physical Exam    VS:  There were no vitals taken for this visit. , BMI There is no height or weight on file to calculate BMI.  Wt Readings from Last 3 Encounters:  03/19/21 204 lb (92.5 kg)  08/16/20 174 lb 2.6 oz (79 kg)  08/14/20 175 lb (79.4 kg)     GEN: Well nourished, well developed, in no acute distress. HEENT: normal. Neck: Supple, no JVD, carotid bruits, or masses. Cardiac: ***RRR, no murmurs, rubs, or gallops. No clubbing, cyanosis, edema.  ***Radials/PT 2+ and equal bilaterally.  Respiratory:  ***Respirations regular and unlabored, clear to auscultation bilaterally. GI: Soft, nontender, nondistended. MS: No deformity or atrophy. Skin: Warm and dry, no rash. Neuro:  Strength and sensation are intact. Psych: Normal affect.  Assessment & Plan    PAF  Prior anticoagulation stopped due to falls  OSA  History of orthostatic hypotension  Parkinson's/PSP disorder  Carotid  disease  Hyperlipidemia -goal < 70      Disposition: Follow up {follow up:15908} with Jenkins Rouge, MD or APP.  Signed, Elgie Collard, PA-C 07/05/2021, 7:21 PM [facility]

## 2021-07-06 ENCOUNTER — Ambulatory Visit: Payer: HMO | Admitting: Physician Assistant

## 2021-07-06 DIAGNOSIS — G4733 Obstructive sleep apnea (adult) (pediatric): Secondary | ICD-10-CM

## 2021-07-06 DIAGNOSIS — E785 Hyperlipidemia, unspecified: Secondary | ICD-10-CM

## 2021-07-06 DIAGNOSIS — I951 Orthostatic hypotension: Secondary | ICD-10-CM

## 2021-07-06 DIAGNOSIS — G20 Parkinson's disease: Secondary | ICD-10-CM

## 2021-07-06 DIAGNOSIS — I779 Disorder of arteries and arterioles, unspecified: Secondary | ICD-10-CM

## 2021-07-06 DIAGNOSIS — I480 Paroxysmal atrial fibrillation: Secondary | ICD-10-CM

## 2021-07-09 DIAGNOSIS — L89613 Pressure ulcer of right heel, stage 3: Secondary | ICD-10-CM | POA: Diagnosis not present

## 2021-07-09 DIAGNOSIS — Z87440 Personal history of urinary (tract) infections: Secondary | ICD-10-CM | POA: Diagnosis not present

## 2021-07-09 DIAGNOSIS — N1831 Chronic kidney disease, stage 3a: Secondary | ICD-10-CM | POA: Diagnosis not present

## 2021-07-09 DIAGNOSIS — E876 Hypokalemia: Secondary | ICD-10-CM | POA: Diagnosis not present

## 2021-07-09 DIAGNOSIS — M4800 Spinal stenosis, site unspecified: Secondary | ICD-10-CM | POA: Diagnosis not present

## 2021-07-09 DIAGNOSIS — I480 Paroxysmal atrial fibrillation: Secondary | ICD-10-CM | POA: Diagnosis not present

## 2021-07-09 DIAGNOSIS — G20 Parkinson's disease: Secondary | ICD-10-CM | POA: Diagnosis not present

## 2021-07-09 DIAGNOSIS — I5032 Chronic diastolic (congestive) heart failure: Secondary | ICD-10-CM | POA: Diagnosis not present

## 2021-07-09 DIAGNOSIS — G231 Progressive supranuclear ophthalmoplegia [Steele-Richardson-Olszewski]: Secondary | ICD-10-CM | POA: Diagnosis not present

## 2021-07-09 DIAGNOSIS — Z8616 Personal history of COVID-19: Secondary | ICD-10-CM | POA: Diagnosis not present

## 2021-07-09 DIAGNOSIS — I130 Hypertensive heart and chronic kidney disease with heart failure and stage 1 through stage 4 chronic kidney disease, or unspecified chronic kidney disease: Secondary | ICD-10-CM | POA: Diagnosis not present

## 2021-07-09 DIAGNOSIS — E782 Mixed hyperlipidemia: Secondary | ICD-10-CM | POA: Diagnosis not present

## 2021-07-09 DIAGNOSIS — G4733 Obstructive sleep apnea (adult) (pediatric): Secondary | ICD-10-CM | POA: Diagnosis not present

## 2021-07-14 DIAGNOSIS — U071 COVID-19: Secondary | ICD-10-CM | POA: Diagnosis not present

## 2021-07-14 DIAGNOSIS — I130 Hypertensive heart and chronic kidney disease with heart failure and stage 1 through stage 4 chronic kidney disease, or unspecified chronic kidney disease: Secondary | ICD-10-CM | POA: Diagnosis not present

## 2021-07-14 DIAGNOSIS — W19XXXA Unspecified fall, initial encounter: Secondary | ICD-10-CM | POA: Diagnosis not present

## 2021-07-22 DIAGNOSIS — L89613 Pressure ulcer of right heel, stage 3: Secondary | ICD-10-CM | POA: Diagnosis not present

## 2021-07-25 DIAGNOSIS — R2681 Unsteadiness on feet: Secondary | ICD-10-CM | POA: Diagnosis not present

## 2021-07-25 DIAGNOSIS — K5900 Constipation, unspecified: Secondary | ICD-10-CM | POA: Diagnosis not present

## 2021-07-25 DIAGNOSIS — E785 Hyperlipidemia, unspecified: Secondary | ICD-10-CM | POA: Diagnosis not present

## 2021-07-25 DIAGNOSIS — R002 Palpitations: Secondary | ICD-10-CM | POA: Diagnosis not present

## 2021-07-25 DIAGNOSIS — E1142 Type 2 diabetes mellitus with diabetic polyneuropathy: Secondary | ICD-10-CM | POA: Diagnosis not present

## 2021-07-25 DIAGNOSIS — R5381 Other malaise: Secondary | ICD-10-CM | POA: Diagnosis not present

## 2021-07-27 DIAGNOSIS — G2581 Restless legs syndrome: Secondary | ICD-10-CM | POA: Diagnosis not present

## 2021-07-27 DIAGNOSIS — G9009 Other idiopathic peripheral autonomic neuropathy: Secondary | ICD-10-CM | POA: Diagnosis not present

## 2021-07-27 DIAGNOSIS — G231 Progressive supranuclear ophthalmoplegia [Steele-Richardson-Olszewski]: Secondary | ICD-10-CM | POA: Diagnosis not present

## 2021-07-29 DIAGNOSIS — L89613 Pressure ulcer of right heel, stage 3: Secondary | ICD-10-CM | POA: Diagnosis not present

## 2021-07-30 DIAGNOSIS — Z1231 Encounter for screening mammogram for malignant neoplasm of breast: Secondary | ICD-10-CM | POA: Diagnosis not present

## 2021-08-05 DIAGNOSIS — L89613 Pressure ulcer of right heel, stage 3: Secondary | ICD-10-CM | POA: Diagnosis not present

## 2021-08-10 DIAGNOSIS — I5032 Chronic diastolic (congestive) heart failure: Secondary | ICD-10-CM | POA: Diagnosis not present

## 2021-08-10 DIAGNOSIS — R2681 Unsteadiness on feet: Secondary | ICD-10-CM | POA: Diagnosis not present

## 2021-08-10 DIAGNOSIS — M6281 Muscle weakness (generalized): Secondary | ICD-10-CM | POA: Diagnosis not present

## 2021-08-10 DIAGNOSIS — R279 Unspecified lack of coordination: Secondary | ICD-10-CM | POA: Diagnosis not present

## 2021-08-10 DIAGNOSIS — Z9181 History of falling: Secondary | ICD-10-CM | POA: Diagnosis not present

## 2021-08-11 ENCOUNTER — Other Ambulatory Visit: Payer: Self-pay

## 2021-08-11 ENCOUNTER — Ambulatory Visit: Payer: HMO | Admitting: Dermatology

## 2021-08-11 DIAGNOSIS — L97909 Non-pressure chronic ulcer of unspecified part of unspecified lower leg with unspecified severity: Secondary | ICD-10-CM

## 2021-08-11 DIAGNOSIS — L97819 Non-pressure chronic ulcer of other part of right lower leg with unspecified severity: Secondary | ICD-10-CM | POA: Diagnosis not present

## 2021-08-11 DIAGNOSIS — L97829 Non-pressure chronic ulcer of other part of left lower leg with unspecified severity: Secondary | ICD-10-CM | POA: Diagnosis not present

## 2021-08-11 NOTE — Patient Instructions (Signed)
?  [name] Slider, MD is a Child psychotherapist with [employer] in [place], [place]. His specialty areas include wound healing. ? ?

## 2021-08-12 DIAGNOSIS — L89613 Pressure ulcer of right heel, stage 3: Secondary | ICD-10-CM | POA: Diagnosis not present

## 2021-08-13 NOTE — Progress Notes (Signed)
Office Visit    Patient Name: [name] Date of Encounter: 08/14/2021  PCP:  [name], [facility]  Cardiologist:  Jenkins Rouge, MD  Advanced Practice Provider:  No care team member to display Electrophysiologist:  None    Chief Complaint    [name] is a 81 y.o. female with a hx of hypertension, OSA with intolerance to CPAP, hyperlipidemia, and PAF presents today for follow-up appointment.  Underwent cardiac catheterization 07/2000 which showed normal coronaries.  Echocardiogram 2015 showed normal LV function with EF 66 5% and normal wall motion.  Myoview 02/2014 showed normal EF and no ischemia.  On flecainide and warfarin for atrial fibrillation.  She was admitted 2019 with discitis/osteomyelitis with concerns of abscess versus old hematoma.  Now status post total fasciectomy, revision of all construct and new fusion construct of T9-L3 by neurosurgery.  She was seen 12/2018 and was experiencing some orthostatic hypotension.  Metoprolol and Lasix were decreased at this time.  She was seen in person January 2021 was having lots of leg weakness and back pain at that time.  She seemed more affected by her Parkinson's.  Discussed changing her Coumadin to Xarelto for anticoagulation.  She had multiple ER visits later that year and ultimately her anticoagulation was stopped.  She was seen September 2021 and was doing well at that time without any chest pain.  No real palpitations.  She has had several falls with no serious injury.  She was recommended to use an electric wheelchair and stop walking due to these falls.  She does get physical therapy at her house.  Today, she has been doing pretty well.  She was recently moved to [place] facility this has limited her falls.  She is almost exclusively using her electric wheelchair and this has limited her falls.  She states that the second she tries to stand up her one leg gives out on  her.  She also states that she has had double vision and treatment efforts have been made.  She does endorse swelling in her legs but we are limited in her diuretic therapy due to her blood pressure being borderline low today.  She cannot tolerate TED hose due to her neuropathy and it is difficult to elevate her legs.  She states that the lightheadedness/dizziness when she stands has improved.  She did have this occur one time when she first moved to [place], however has not happened again.  We discussed getting an updated lipid panel and ordering a carotid ultrasound since she has not had one in several years and does have mild carotid stenosis.  She does have a diagnosis of sleep apnea however she cannot tolerate wearing a CPAP.  She does not want to undergo another sleep study and it does not seem that she is waking up gasping for air at night.  She is having difficulty sleeping for other reasons.  Her roommate is very noisy and keeps her up at night.  Reports no shortness of breath nor dyspnea on exertion. Reports no chest pain, pressure, or tightness. No orthopnea, PND. Reports no palpitations.    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   GERD (gastroesophageal reflux  disease)    Hyperlipidemia    Hypertension    Lumbar stenosis    OSA (obstructive sleep apnea)    "couldn't handle CPAP; use mouth guard some; not all the time" (01/06/2014)   PAF (paroxysmal atrial fibrillation) ([place])    Scoliosis    with radiculopathy L2-S1 with prior surgery   Small bowel obstruction ([place])    versus ileus after last bck surgery   Spondylosis    Past Surgical History:  Procedure Laterality Date   BACK SURGERY     CATARACT EXTRACTION W/ INTRAOCULAR LENS  IMPLANT, BILATERAL  2011   CHOLECYSTECTOMY N/A 07/07/2018    Procedure: LAPAROSCOPIC CHOLECYSTECTOMY;  Surgeon: Erroll Luna, MD;  Location: [facility];  Service: General;  Laterality: N/A;   COLONOSCOPY  12/09/2004   LIPOMA EXCISION  1980's   "fatty tumors"   [place]  02/2009   "ruptured disc"   NASAL SEPTUM SURGERY  80's   POSTERIOR LUMBAR FUSION  06/2010; 10/2011   "placed screws, rods, spacers both times"   POSTERIOR LUMBAR FUSION 4 LEVEL N/A 12/13/2018   Procedure: THORACIC NINE -[place];  Surgeon: Judith Part, MD;  Location: [facility];  Service: Neurosurgery;  Laterality: N/A;   REPAIR DURAL / CSF LEAK  02/2009   THORACIC LAMINECTOMY FOR EPIDURAL ABSCESS N/A 09/28/2018   Procedure: THORACIC NINE-THORACIC TEN, THORACIC TEN-THORACIC ELEVEN, THORACIC ELEVEN-THORACIC TWELVE LAMINECTOMIES FOR EPIDURAL ABSCESS;  Surgeon: Judith Part, MD;  Location: [facility];  Service: Neurosurgery;  Laterality: N/A;   TOTAL ABDOMINAL HYSTERECTOMY  02/1993    Allergies  Allergies  Allergen Reactions   Lyrica [Pregabalin] Other (See Comments)    Felt loopy   Zocor [Simvastatin] Other (See Comments)    Myalgias   Relafen [Nabumetone] Rash   EKGs/Labs/Other Studies Reviewed:   The following studies were reviewed today:  US Carotids bilaterally 04/22/2016  IMPRESSION: Minor carotid atherosclerosis. No hemodynamically significant ICA stenosis. Degree of narrowing less than 50% bilaterally.   Patent antegrade vertebral flow bilaterally  EKG:  EKG is not ordered today.    Recent Labs: 08/16/2020: BUN 19; Creatinine, Ser 0.98; Hemoglobin 13.5; Platelets 190; Potassium 4.3; Sodium 140  Recent Lipid Panel    Component Value Date/Time   CHOL  12/22/2006 0550    183        ATP III CLASSIFICATION:  <200     mg/dL   Desirable  200-239  mg/dL   Borderline High  >=240    mg/dL   High   TRIG 174 (H) 12/22/2006 0550   HDL 47 12/22/2006 0550   CHOLHDL 3.9 12/22/2006 0550   VLDL 35 12/22/2006 0550   LDLCALC  (H) 12/22/2006 0550    101        Total Cholesterol/HDL:CHD Risk Coronary Heart Disease Risk Table                     Men   Women  1/2 Average Risk   3.4   3.3    Risk Assessment/Calculations:   CHA2DS2-VASc Score = 5   This indicates a 7.2% annual risk of stroke. The patient's score is based upon: CHF History: 0 HTN History: 1 Diabetes History: 0 Stroke History: 0 Vascular Disease History: 1 Age Score: 2 Gender Score: 1     Home Medications   Current Meds  Medication Sig   acetaminophen (TYLENOL 8 HOUR) 650 MG CR tablet Take 1 tablet (650 mg total) by mouth every 8 (eight)  hours as needed for pain.   carbidopa-levodopa (SINEMET IR) 25-100 MG tablet Take 3 tablets by mouth 3 (three) times daily.   Cholecalciferol 50 MCG (2000 UT) CAPS Take 4,000 Units by mouth daily.   DULoxetine (CYMBALTA) 60 MG capsule Take 60 mg by mouth 2 (two) times daily with a meal.   flecainide (TAMBOCOR) 50 MG tablet Take 50 mg by mouth daily.   furosemide (LASIX) 20 MG tablet TAKE 1 TABLET(20 MG) BY MOUTH DAILY (Patient taking differently: Take 20 mg by mouth daily.)   gabapentin (NEURONTIN) 300 MG capsule Take 300-600 mg by mouth See admin instructions. Take one capsule (300 mg) by mouth daily with lunch and supper, and take two capsules (600 mg) at bedtime.   Menthol, Topical Analgesic, (BIOFREEZE EX) Apply 1 application topically daily as needed (pain).    metoprolol succinate (TOPROL-XL) 25 MG 24 hr tablet Take 12.5 mg by mouth daily.   NON FORMULARY Gaba '750mg'$  take 1 tab once a day   pantoprazole (PROTONIX) 40 MG tablet Take 40 mg by mouth daily.    polyethylene glycol (MIRALAX / GLYCOLAX) packet Take 17 g by mouth daily. (Patient taking differently: Take 17 g by mouth daily as needed for mild constipation.)   potassium chloride SA (K-DUR) 20 MEQ tablet Take 1 tablet (20 mEq total) by mouth daily.   pravastatin (PRAVACHOL) 40 MG tablet Take 40 mg by mouth daily with lunch.   TRAMADOL HCL ER PO  Take 25 mg by mouth as needed.   TRAVATAN Z 0.004 % SOLN ophthalmic solution Place 1 drop into both eyes at bedtime.      Review of Systems      All other systems reviewed and are otherwise negative except as noted above.  Physical Exam    VS:  BP (!) 101/59    Pulse 71    Ht '5\' 7"'$  (1.702 m)    Wt 204 lb (92.5 kg)    SpO2 100%    BMI 31.95 kg/m  , BMI Body mass index is 31.95 kg/m.  Wt Readings from Last 3 Encounters:  08/14/21 204 lb (92.5 kg)  03/19/21 204 lb (92.5 kg)  08/16/20 174 lb 2.6 oz (79 kg)     GEN: Well nourished, well developed, in no acute distress. HEENT: normal. Neck: Supple, no JVD, carotid bruits, or masses. Cardiac: RRR, no murmurs, rubs, or gallops. No clubbing, cyanosis, 1+ non pitting edema.  Radials/PT 2+ and equal bilaterally.  Respiratory:  Respirations regular and unlabored, clear to auscultation bilaterally. GI: Soft, nontender, nondistended. MS: No deformity or atrophy. Skin: Warm and dry, no rash. Neuro:  Strength and sensation are intact. Psych: Normal affect.  Assessment & Plan    PAF -NSR today -She has not had any fluttering in her chest or any symptoms  -Due to multiple falls, her has been off anticoagulation  OSA -couldn't tolerate CPAP -wouldn't want a sleep study  -Asymptomatic per the patient   History of orthostatic hypotension -She had one episode when she first moved to [place] but it seems to be more related to hypoglycemia/dehydration than true orthostatic hypotension -resolved -maintain proper hydration -recommended compression hose/socks. Difficult to tolerate due to patient's neuropathy  Parkinson's  Carotid disease -Will order f/u carotid US since she has not had one since 2017 -No bruit on exam  Hyperlipidemia -Recommend Lipid panel in the next few weeks-can be done at facility -Continue pravastatin    Disposition: Follow up 1 year with Jenkins Rouge, MD or  APP.  Signed, Elgie Collard, PA-C 08/14/2021, 1:03  PM [place]

## 2021-08-14 ENCOUNTER — Encounter: Payer: Self-pay | Admitting: Physician Assistant

## 2021-08-14 ENCOUNTER — Ambulatory Visit (INDEPENDENT_AMBULATORY_CARE_PROVIDER_SITE_OTHER): Payer: HMO | Admitting: Physician Assistant

## 2021-08-14 ENCOUNTER — Other Ambulatory Visit: Payer: Self-pay

## 2021-08-14 VITALS — BP 101/59 | HR 71 | Ht 67.0 in | Wt 204.0 lb

## 2021-08-14 DIAGNOSIS — I6523 Occlusion and stenosis of bilateral carotid arteries: Secondary | ICD-10-CM

## 2021-08-14 DIAGNOSIS — I480 Paroxysmal atrial fibrillation: Secondary | ICD-10-CM

## 2021-08-14 DIAGNOSIS — E785 Hyperlipidemia, unspecified: Secondary | ICD-10-CM

## 2021-08-14 DIAGNOSIS — I951 Orthostatic hypotension: Secondary | ICD-10-CM

## 2021-08-14 DIAGNOSIS — G20 Parkinson's disease: Secondary | ICD-10-CM

## 2021-08-14 DIAGNOSIS — G4733 Obstructive sleep apnea (adult) (pediatric): Secondary | ICD-10-CM | POA: Diagnosis not present

## 2021-08-14 DIAGNOSIS — I779 Disorder of arteries and arterioles, unspecified: Secondary | ICD-10-CM | POA: Diagnosis not present

## 2021-08-14 NOTE — Patient Instructions (Signed)
Medication Instructions:  Your physician recommends that you continue on your current medications as directed. Please refer to the Current Medication list given to you today.  *If you need a refill on your cardiac medications before your next appointment, please call your pharmacy*   Lab Work: Lipid panel to be done at [facility] If you have labs (blood work) drawn today and your tests are completely normal, you will receive your results only by: [place] (if you have MyChart) OR A paper copy in the mail If you have any lab test that is abnormal or we need to change your treatment, we will call you to review the results.   Testing/Procedures: Your physician has requested that you have a carotid duplex. This test is an ultrasound of the carotid arteries in your neck. It looks at blood flow through these arteries that supply the brain with blood. Allow one hour for this exam. There are no restrictions or special instructions.    Follow-Up: At [facility], you and your health needs are our priority.  As part of our continuing mission to provide you with exceptional heart care, we have created designated Provider Care Teams.  These Care Teams include your primary Cardiologist (physician) and Advanced Practice Providers (APPs -  Physician Assistants and Nurse Practitioners) who all work together to provide you with the care you need, when you need it.   Your next appointment:   1 year(s)  The format for your next appointment:   In Person  Provider:   Jenkins Rouge, MD {  Other Instructions 1.Try to elevate you legs when you are sitting 2.Wear compression socks  Heart-Healthy Eating Plan Many factors influence your heart (coronary) health, including eating and exercise habits. Coronary risk increases with abnormal blood fat (lipid) levels. Heart-healthy meal planning includes limiting unhealthy fats, increasing healthy fats, and making other diet and lifestyle changes. What is my  plan? Your health care provider may recommend that you: Limit your fat intake to _________% or less of your total calories each day. Limit your saturated fat intake to _________% or less of your total calories each day. Limit the amount of cholesterol in your diet to less than _________ mg per day. What are tips for following this plan? Cooking Cook foods using methods other than frying. Baking, boiling, grilling, and broiling are all good options. Other ways to reduce fat include: Removing the skin from poultry. Removing all visible fats from meats. Steaming vegetables in water or broth. Meal planning  At meals, imagine dividing your plate into fourths: Fill one-half of your plate with vegetables and green salads. Fill one-fourth of your plate with whole grains. Fill one-fourth of your plate with lean protein foods. Eat 4-5 servings of vegetables per day. One serving equals 1 cup raw or cooked vegetable, or 2 cups raw leafy greens. Eat 4-5 servings of fruit per day. One serving equals 1 medium whole fruit,  cup dried fruit,  cup fresh, frozen, or canned fruit, or  cup 100% fruit juice. Eat more foods that contain soluble fiber. Examples include apples, broccoli, carrots, beans, peas, and barley. Aim to get 25-30 g of fiber per day. Increase your consumption of legumes, nuts, and seeds to 4-5 servings per week. One serving of dried beans or legumes equals  cup cooked, 1 serving of nuts is  cup, and 1 serving of seeds equals 1 tablespoon. Fats Choose healthy fats more often. Choose monounsaturated and polyunsaturated fats, such as olive and canola oils, flaxseeds,  walnuts, almonds, and seeds. Eat more omega-3 fats. Choose salmon, mackerel, sardines, tuna, flaxseed oil, and ground flaxseeds. Aim to eat fish at least 2 times each week. Check food labels carefully to identify foods with trans fats or high amounts of saturated fat. Limit saturated fats. These are found in animal products,  such as meats, butter, and cream. Plant sources of saturated fats include palm oil, palm kernel oil, and coconut oil. Avoid foods with partially hydrogenated oils in them. These contain trans fats. Examples are stick margarine, some tub margarines, cookies, crackers, and other baked goods. Avoid fried foods. General information Eat more home-cooked food and less restaurant, buffet, and fast food. Limit or avoid alcohol. Limit foods that are high in starch and sugar. Lose weight if you are overweight. Losing just 5-10% of your body weight can help your overall health and prevent diseases such as diabetes and heart disease. Monitor your salt (sodium) intake, especially if you have high blood pressure. Talk with your health care provider about your sodium intake. Try to incorporate more vegetarian meals weekly. What foods can I eat? Fruits All fresh, canned (in natural juice), or frozen fruits. Vegetables Fresh or frozen vegetables (raw, steamed, roasted, or grilled). Green salads. Grains Most grains. Choose whole wheat and whole grains most of the time. Rice and pasta, including brown rice and pastas made with whole wheat. Meats and other proteins Lean, well-trimmed beef, veal, pork, and lamb. Chicken and [place] without skin. All fish and shellfish. Wild duck, rabbit, pheasant, and venison. Egg whites or low-cholesterol egg substitutes. Dried beans, peas, lentils, and tofu. Seeds and most nuts. Dairy Low-fat or nonfat cheeses, including ricotta and mozzarella. Skim or 1% milk (liquid, powdered, or evaporated). Buttermilk made with low-fat milk. Nonfat or low-fat yogurt. Fats and oils Non-hydrogenated (trans-free) margarines. Vegetable oils, including soybean, sesame, sunflower, olive, peanut, safflower, corn, canola, and cottonseed. Salad dressings or mayonnaise made with a vegetable oil. Beverages Water (mineral or sparkling). Coffee and tea. Diet carbonated beverages. Sweets and  desserts Sherbet, gelatin, and fruit ice. Small amounts of dark chocolate. Limit all sweets and desserts. Seasonings and condiments All seasonings and condiments. The items listed above may not be a complete list of foods and beverages you can eat. Contact a dietitian for more options. What foods are not recommended? Fruits Canned fruit in heavy syrup. Fruit in cream or butter sauce. Fried fruit. Limit coconut. Vegetables Vegetables cooked in cheese, cream, or butter sauce. Fried vegetables. Grains Breads made with saturated or trans fats, oils, or whole milk. Croissants. Sweet rolls. Donuts. High-fat crackers, such as cheese crackers. Meats and other proteins Fatty meats, such as hot dogs, ribs, sausage, bacon, rib-eye roast or steak. High-fat deli meats, such as salami and bologna. Caviar. Domestic duck and goose. Organ meats, such as liver. Dairy Cream, sour cream, cream cheese, and creamed cottage cheese. Whole-milk cheeses. Whole or 2% milk (liquid, evaporated, or condensed). Whole buttermilk. Cream sauce or high-fat cheese sauce. Whole-milk yogurt. Fats and oils Meat fat, or shortening. Cocoa butter, hydrogenated oils, palm oil, coconut oil, palm kernel oil. Solid fats and shortenings, including bacon fat, salt pork, lard, and butter. Nondairy cream substitutes. Salad dressings with cheese or sour cream. Beverages Regular sodas and any drinks with added sugar. Sweets and desserts Frosting. Pudding. Cookies. Cakes. Pies. Milk chocolate or white chocolate. Buttered syrups. Full-fat ice cream or ice cream drinks. The items listed above may not be a complete list of foods and beverages to avoid. Contact a  dietitian for more information. Summary Heart-healthy meal planning includes limiting unhealthy fats, increasing healthy fats, and making other diet and lifestyle changes. Lose weight if you are overweight. Losing just 5-10% of your body weight can help your overall health and prevent  diseases such as diabetes and heart disease. Focus on eating a balance of foods, including fruits and vegetables, low-fat or nonfat dairy, lean protein, nuts and legumes, whole grains, and heart-healthy oils and fats. This information is not intended to replace advice given to you by your health care provider. Make sure you discuss any questions you have with your health care provider. Document Revised: 10/02/2020 Document Reviewed: 10/02/2020 Elsevier Patient Education  2022 [employer].

## 2021-08-19 DIAGNOSIS — L89613 Pressure ulcer of right heel, stage 3: Secondary | ICD-10-CM | POA: Diagnosis not present

## 2021-08-20 DIAGNOSIS — R9430 Abnormal result of cardiovascular function study, unspecified: Secondary | ICD-10-CM | POA: Diagnosis not present

## 2021-08-22 ENCOUNTER — Encounter: Payer: Self-pay | Admitting: Dermatology

## 2021-08-22 NOTE — Progress Notes (Signed)
? ?  Follow-Up Visit ?  ?Subjective  ?[name] is a 81 y.o. female who presents for the following: Follow-up (Pt here to f/u on venous ulcer on the l lower leg. Pt also has a wound on the Posterior Left Lower Leg she would like evaluated ). ? ?Legs, 2 ulcers, 1 just dressed in the past day ?Location:  ?Duration:  ?Quality:  ?Associated Signs/Symptoms: ?Modifying Factors:  ?Severity:  ?Timing: ?Context:  ? ?Objective  ?Well appearing patient in no apparent distress; mood and affect are within normal limits. ?Left Lower Leg - Anterior, Right Lower Leg - Anterior ?Ankle ulcer stated to be improving with care she receives at [facility]..  Achilles ulcer was left dressed.  No sign of secondary infection.  These are clinically vascular/venous ulcers and do not need biopsy to exclude squamous cell carcinoma or other diagnoses. ? ? ? ?A focused examination was performed including feet and legs. Relevant physical exam findings are noted in the Assessment and Plan. ? ? ?Assessment & Plan  ? ? ?Venous ulcer ([place]) (2) ?Left Lower Leg - Anterior; Right Lower Leg - Anterior ? ?I suggested to patient and her daughter that as long as there is gradual improvement she can continue the current wound care given at [facility].  I will recheck her on a as needed basis. ? ? ? ? ? ?I, Lavonna Monarch, MD, have reviewed all documentation for this visit.  The documentation on 08/22/21 for the exam, diagnosis, procedures, and orders are all accurate and complete. ?

## 2021-08-24 DIAGNOSIS — G9009 Other idiopathic peripheral autonomic neuropathy: Secondary | ICD-10-CM | POA: Diagnosis not present

## 2021-08-24 DIAGNOSIS — G231 Progressive supranuclear ophthalmoplegia [Steele-Richardson-Olszewski]: Secondary | ICD-10-CM | POA: Diagnosis not present

## 2021-08-24 DIAGNOSIS — G2581 Restless legs syndrome: Secondary | ICD-10-CM | POA: Diagnosis not present

## 2021-08-25 ENCOUNTER — Ambulatory Visit (HOSPITAL_COMMUNITY)
Admission: RE | Admit: 2021-08-25 | Discharge: 2021-08-25 | Disposition: A | Discharge status: Home / Self Care | Payer: HMO | Source: Ambulatory Visit | Attending: Cardiovascular Disease | Admitting: Cardiovascular Disease

## 2021-08-25 ENCOUNTER — Other Ambulatory Visit: Payer: Self-pay

## 2021-08-25 DIAGNOSIS — I6523 Occlusion and stenosis of bilateral carotid arteries: Secondary | ICD-10-CM | POA: Diagnosis not present

## 2021-08-26 DIAGNOSIS — L89613 Pressure ulcer of right heel, stage 3: Secondary | ICD-10-CM | POA: Diagnosis not present

## 2021-08-31 DIAGNOSIS — F4321 Adjustment disorder with depressed mood: Secondary | ICD-10-CM | POA: Diagnosis not present

## 2021-09-02 DIAGNOSIS — L89613 Pressure ulcer of right heel, stage 3: Secondary | ICD-10-CM | POA: Diagnosis not present

## 2021-09-02 DIAGNOSIS — F4321 Adjustment disorder with depressed mood: Secondary | ICD-10-CM | POA: Diagnosis not present

## 2021-09-03 DIAGNOSIS — I739 Peripheral vascular disease, unspecified: Secondary | ICD-10-CM | POA: Diagnosis not present

## 2021-09-03 DIAGNOSIS — L603 Nail dystrophy: Secondary | ICD-10-CM | POA: Diagnosis not present

## 2021-09-03 DIAGNOSIS — L602 Onychogryphosis: Secondary | ICD-10-CM | POA: Diagnosis not present

## 2021-09-07 DIAGNOSIS — Z9181 History of falling: Secondary | ICD-10-CM | POA: Diagnosis not present

## 2021-09-07 DIAGNOSIS — M6281 Muscle weakness (generalized): Secondary | ICD-10-CM | POA: Diagnosis not present

## 2021-09-07 DIAGNOSIS — I5032 Chronic diastolic (congestive) heart failure: Secondary | ICD-10-CM | POA: Diagnosis not present

## 2021-09-07 DIAGNOSIS — G20 Parkinson's disease: Secondary | ICD-10-CM | POA: Diagnosis not present

## 2021-09-07 DIAGNOSIS — R2681 Unsteadiness on feet: Secondary | ICD-10-CM | POA: Diagnosis not present

## 2021-09-09 DIAGNOSIS — L89613 Pressure ulcer of right heel, stage 3: Secondary | ICD-10-CM | POA: Diagnosis not present

## 2021-09-14 NOTE — Progress Notes (Signed)
? ? ?Assessment/Plan:  ? ?1.  PSP ? -Patient understands differences between PSP and Parkinson's disease.  Understands that there is an increased risk of falls, aspiration as subsequent morbidity and mortality from this disease compared to Parkinson's disease. ?            -Continue carbidopa/levodopa 25/100, 2 tablets 3 times per day.  The nursing home is spacing this out every 8 hours, so we will change it to the intended times of 7 AM/11 AM/4 PM ? ?   ?  ?2.  History of discitis ?            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  issue now resolved ?  ?3.  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            -Patient follows with [facility] now for pain management.  She has appt 5/19 for back injections.  These have actually been quite helpful and she is virtually pain-free after she gets those. ? ?4.  Depression ? -discussed that she needs to stay engaged.  Discussed ways to do this, including socializing with others at [employer], writing her life story, etc ? -discussed that she needs to discuss with pcp.  Apparently, they discussed with NP and was told by NP she wasn't depressed.  I disagree and think she needs referral to psychiatry.  Her husband died a year ago, and she just has been profoundly depressed ever since then.  Part of that was because of his death and part because of the fact that she had to move into full-time nursing care after that.  I did write an order for psychiatry evaluation. ?Subjective:  ? ?[name] was seen today in follow up for PSP.  My previous records were reviewed prior to todays visit as well as outside records available to me.  Pt with daughter who supplements hx.  Other daughter on phone.  She has moved from [place] to [place] home since our last visit.  Records available have been reviewed.  She does have some trouble sleeping at night because roommate is noisy.  She does have some double vision.  She states that the SNF won't let her use the  electric WC because she doesn't drive it well.  She has a sore on the R foot since end of last year so cannot use that to propel herself.  She did fall at [facility] a few times b/c she would call staff and they wouldn't come but hasn't had any falls since being at [facility].  [facility] just opened bingo back up yesterday.  She isn't doing any activities.  She feels depressed. ? ?Current prescribed movement disorder medications: ?Carbidopa/levodopa 25/100, 1 tablet 6 times daily (instead of 2 tablets 3 times daily to see if it helped for facial dyskinesia) ? ? ?PREVIOUS MEDICATIONS: Rasagiline (confusion) ? ?ALLERGIES:   ?Allergies  ?Allergen Reactions  ? Lyrica [Pregabalin] Other (See Comments)  ?  Felt loopy  ? Zocor [Simvastatin] Other (See Comments)  ?  Myalgias  ? Relafen [Nabumetone] Rash  ? ? ?CURRENT MEDICATIONS:  ?Outpatient Encounter Medications as of 09/22/2021  ?Medication Sig  ? acetaminophen (TYLENOL 8 HOUR) 650 MG CR tablet Take 1 tablet (650 mg total) by mouth every 8 (eight) hours as needed for pain.  ? carbidopa-levodopa (SINEMET IR) 25-100 MG tablet Take 3 tablets by mouth 3 (three) times daily.  ? Cholecalciferol 50 MCG (2000 UT) CAPS Take 4,000 Units by mouth daily.  ? DULoxetine (  CYMBALTA) 60 MG capsule Take 60 mg by mouth 2 (two) times daily with a meal.  ? flecainide (TAMBOCOR) 50 MG tablet Take 50 mg by mouth daily.  ? furosemide (LASIX) 20 MG tablet TAKE 1 TABLET(20 MG) BY MOUTH DAILY (Patient taking differently: Take 20 mg by mouth daily.)  ? gabapentin (NEURONTIN) 300 MG capsule Take 300-600 mg by mouth See admin instructions. Take one capsule (300 mg) by mouth daily with lunch and supper, and take two capsules (600 mg) at bedtime.  ? Menthol, Topical Analgesic, (BIOFREEZE EX) Apply 1 application topically daily as needed (pain).   ? metoprolol succinate (TOPROL-XL) 25 MG 24 hr tablet Take 12.5 mg by mouth daily.  ? NON FORMULARY Gaba '750mg'$  take 1 tab once a day  ? pantoprazole (PROTONIX) 40 MG  tablet Take 40 mg by mouth daily.   ? polyethylene glycol (MIRALAX / GLYCOLAX) packet Take 17 g by mouth daily. (Patient taking differently: Take 17 g by mouth daily as needed for mild constipation.)  ? potassium chloride SA (K-DUR) 20 MEQ tablet Take 1 tablet (20 mEq total) by mouth daily.  ? pravastatin (PRAVACHOL) 40 MG tablet Take 40 mg by mouth daily with lunch.  ? TRAMADOL HCL ER PO Take 25 mg by mouth as needed.  ? TRAVATAN Z 0.004 % SOLN ophthalmic solution Place 1 drop into both eyes at bedtime.   ? ?No facility-administered encounter medications on file as of 09/22/2021.  ? ? ?Objective:  ? ?PHYSICAL EXAMINATION:   ? ?VITALS:   ?Vitals:  ? 09/22/21 1039  ?BP: (!) 143/80  ?Pulse: 86  ?SpO2: 97%  ?Weight: 210 lb (95.3 kg)  ?Height: '5\' 7"'$  (1.702 m)  ? ? ? ?GEN:  The patient appears stated age and is in NAD.  She is tearful at times. ?HEENT:  Normocephalic, atraumatic.  The mucous membranes are moist. The superficial temporal arteries are without ropiness or tenderness. ?CV:  RRR ?Lungs:  CTAB ?Neck/HEME:  There are no carotid bruits bilaterally. ? ?Neurological examination: ? ?Orientation: The patient is alert and oriented x3. ?Cranial nerves: There is good facial symmetry with facial hypomimia.  She does have some square wave jerks.  The speech is fluent and clear. Soft palate rises symmetrically and there is no tongue deviation. Hearing is intact to conversational tone. ?Sensation: Sensation is intact to light touch throughout ?Motor: Strength is at least antigravity x4. ? ?Movement examination: ?Tone: There is normal tone in the upper extremities ?Abnormal movements: mild head dyskinesia, only when distracted and asked to do other movements. ?Coordination:  There is mild decremation with RAM's ?Gait and Station: Not tested as patient is not walking (she is working on transfers with therapy). ?   ? ?I have reviewed and interpreted the following labs independently ? ?  Chemistry   ?   ?Component Value  Date/Time  ? NA 140 08/16/2020 1715  ? K 4.3 08/16/2020 1715  ? CL 101 08/16/2020 1715  ? CO2 31 08/16/2020 1715  ? BUN 19 08/16/2020 1715  ? CREATININE 0.98 08/16/2020 1715  ? CREATININE 0.93 01/11/2019 1547  ?    ?Component Value Date/Time  ? CALCIUM 9.5 08/16/2020 1715  ? ALKPHOS 94 06/07/2020 0359  ? AST 20 06/07/2020 0359  ? ALT 10 06/07/2020 0359  ? BILITOT 0.3 06/07/2020 0359  ?  ? ? ? ?Lab Results  ?Component Value Date  ? WBC 7.8 08/16/2020  ? HGB 13.5 08/16/2020  ? HCT 43.7 08/16/2020  ? MCV 92.4  08/16/2020  ? PLT 190 08/16/2020  ? ? ?Lab Results  ?Component Value Date  ? TSH 1.298 12/25/2019  ? ? ? ?Total time spent on today's visit was 30 minutes, including both face-to-face time and nonface-to-face time.  Time included that spent on review of records (prior notes available to me/labs/imaging if pertinent), discussing treatment and goals, answering patient's questions and coordinating care. ? ?Cc:  [name], MD ? ?

## 2021-09-16 DIAGNOSIS — L89613 Pressure ulcer of right heel, stage 3: Secondary | ICD-10-CM | POA: Diagnosis not present

## 2021-09-22 ENCOUNTER — Encounter: Payer: Self-pay | Admitting: Neurology

## 2021-09-22 ENCOUNTER — Ambulatory Visit: Payer: HMO | Admitting: Neurology

## 2021-09-22 VITALS — BP 143/80 | HR 86 | Ht 67.0 in | Wt 210.0 lb

## 2021-09-22 DIAGNOSIS — F331 Major depressive disorder, recurrent, moderate: Secondary | ICD-10-CM

## 2021-09-22 DIAGNOSIS — G231 Progressive supranuclear ophthalmoplegia [Steele-Richardson-Olszewski]: Secondary | ICD-10-CM | POA: Diagnosis not present

## 2021-09-22 MED ORDER — CARBIDOPA-LEVODOPA 25-100 MG PO TABS: 2.0000 | ORAL_TABLET | Freq: Three times a day (TID) | ORAL | 1 refills | Status: DC

## 2021-09-22 NOTE — Patient Instructions (Signed)
Take carbidopa/levodopa 25/100, 2 at 7am, 2 at 11 am, 2 at 4pm (note time change) ?I would like to see a referral to psychiatry for depression.  I gave you the referral for this for them to schedule for you. ? ?The physicians and staff at [facility] are committed to providing excellent care. You may receive a survey requesting feedback about your experience at our office. We strive to receive "very good" responses to the survey questions. If you feel that your experience would prevent you from giving the office a "very good " response, please contact our office to try to remedy the situation. We may be reached at [phone]. Thank you for taking the time out of your busy day to complete the survey. ? ?

## 2021-09-23 DIAGNOSIS — L89613 Pressure ulcer of right heel, stage 3: Secondary | ICD-10-CM | POA: Diagnosis not present

## 2021-09-24 DIAGNOSIS — G2581 Restless legs syndrome: Secondary | ICD-10-CM | POA: Diagnosis not present

## 2021-09-24 DIAGNOSIS — G9009 Other idiopathic peripheral autonomic neuropathy: Secondary | ICD-10-CM | POA: Diagnosis not present

## 2021-09-24 DIAGNOSIS — G231 Progressive supranuclear ophthalmoplegia [Steele-Richardson-Olszewski]: Secondary | ICD-10-CM | POA: Diagnosis not present

## 2021-09-25 DIAGNOSIS — G231 Progressive supranuclear ophthalmoplegia [Steele-Richardson-Olszewski]: Secondary | ICD-10-CM | POA: Diagnosis not present

## 2021-09-25 DIAGNOSIS — I5032 Chronic diastolic (congestive) heart failure: Secondary | ICD-10-CM | POA: Diagnosis not present

## 2021-09-25 DIAGNOSIS — I480 Paroxysmal atrial fibrillation: Secondary | ICD-10-CM | POA: Diagnosis not present

## 2021-09-28 DIAGNOSIS — H401131 Primary open-angle glaucoma, bilateral, mild stage: Secondary | ICD-10-CM | POA: Diagnosis not present

## 2021-09-28 DIAGNOSIS — H04123 Dry eye syndrome of bilateral lacrimal glands: Secondary | ICD-10-CM | POA: Diagnosis not present

## 2021-09-28 DIAGNOSIS — H532 Diplopia: Secondary | ICD-10-CM | POA: Diagnosis not present

## 2021-09-30 DIAGNOSIS — L89613 Pressure ulcer of right heel, stage 3: Secondary | ICD-10-CM | POA: Diagnosis not present

## 2021-10-14 DIAGNOSIS — L89613 Pressure ulcer of right heel, stage 3: Secondary | ICD-10-CM | POA: Diagnosis not present

## 2021-10-21 DIAGNOSIS — L89613 Pressure ulcer of right heel, stage 3: Secondary | ICD-10-CM | POA: Diagnosis not present

## 2021-10-23 DIAGNOSIS — M5416 Radiculopathy, lumbar region: Secondary | ICD-10-CM | POA: Diagnosis not present

## 2021-10-28 DIAGNOSIS — L89613 Pressure ulcer of right heel, stage 3: Secondary | ICD-10-CM | POA: Diagnosis not present

## 2021-11-04 DIAGNOSIS — L89613 Pressure ulcer of right heel, stage 3: Secondary | ICD-10-CM | POA: Diagnosis not present

## 2021-11-25 DIAGNOSIS — F4321 Adjustment disorder with depressed mood: Secondary | ICD-10-CM | POA: Diagnosis not present

## 2021-12-16 DIAGNOSIS — F4321 Adjustment disorder with depressed mood: Secondary | ICD-10-CM | POA: Diagnosis not present

## 2021-12-16 DIAGNOSIS — F5101 Primary insomnia: Secondary | ICD-10-CM | POA: Diagnosis not present

## 2022-01-20 DIAGNOSIS — R4182 Altered mental status, unspecified: Secondary | ICD-10-CM | POA: Diagnosis not present

## 2022-01-20 DIAGNOSIS — Z79899 Other long term (current) drug therapy: Secondary | ICD-10-CM | POA: Diagnosis not present

## 2022-01-20 DIAGNOSIS — E559 Vitamin D deficiency, unspecified: Secondary | ICD-10-CM | POA: Diagnosis not present

## 2022-01-20 DIAGNOSIS — R300 Dysuria: Secondary | ICD-10-CM | POA: Diagnosis not present

## 2022-01-20 DIAGNOSIS — I4891 Unspecified atrial fibrillation: Secondary | ICD-10-CM | POA: Diagnosis not present

## 2022-02-04 ENCOUNTER — Telehealth: Payer: Self-pay | Admitting: Neurology

## 2022-02-04 NOTE — Telephone Encounter (Signed)
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 She just wanted to give tat a heads up as this is a new issue

## 2022-02-04 NOTE — Telephone Encounter (Signed)
Called left patients daughter a message

## 2022-02-19 DIAGNOSIS — M5416 Radiculopathy, lumbar region: Secondary | ICD-10-CM | POA: Diagnosis not present

## 2022-02-21 DIAGNOSIS — I739 Peripheral vascular disease, unspecified: Secondary | ICD-10-CM | POA: Diagnosis not present

## 2022-02-21 DIAGNOSIS — M79671 Pain in right foot: Secondary | ICD-10-CM | POA: Diagnosis not present

## 2022-02-21 DIAGNOSIS — M79672 Pain in left foot: Secondary | ICD-10-CM | POA: Diagnosis not present

## 2022-02-21 DIAGNOSIS — F4321 Adjustment disorder with depressed mood: Secondary | ICD-10-CM | POA: Diagnosis not present

## 2022-02-21 DIAGNOSIS — F5101 Primary insomnia: Secondary | ICD-10-CM | POA: Diagnosis not present

## 2022-03-22 NOTE — Progress Notes (Unsigned)
Assessment/Plan:   1.  PSP  -Patient understands differences between PSP and Parkinson's disease.  Understands that there is an increased risk of falls, aspiration as subsequent morbidity and mortality from this disease compared to Parkinson's disease.             -Continue carbidopa/levodopa 25/100, 2 tablets 3 times per day.  The nursing home is spacing this out every 8 hours, so we will change it to the intended times of 7 AM/11 AM/4 PM       2.  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  issue now resolved   3.  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  She has appt 5/19 for back injections.  These have actually been quite helpful and she is virtually pain-free after she gets those.  4.  Depression  -discussed that she needs to stay engaged.  Discussed ways to do this, including socializing with others at [facility], writing her life story, etc  -discussed that she needs to discuss with pcp.  Apparently, they discussed with NP and was told by NP she wasn't depressed.  I disagree and think she needs referral to psychiatry.  Her husband died a year ago, and she just has been profoundly depressed ever since then.  Part of that was because of his death and part because of the fact that she had to move into full-time nursing care after that.  I did write an order for psychiatry evaluation. Subjective:   [name] was seen today in follow up for PSP.  My previous records were reviewed prior to todays visit as well as outside records available to me.  Pt with daughter who supplements hx.  Other daughter on phone.  She has moved from [place] to [place] home since our last visit.  Records available have been reviewed.  She does have some trouble sleeping at night because roommate is noisy.  She does have some double vision.  She states that the SNF won't let her use the  electric WC because she doesn't drive it well.  She has a sore on the R foot since end of last year so cannot use that to propel herself.  She did fall at [facility] a few times b/c she would call staff and they wouldn't come but hasn't had any falls since being at [facility].  [facility] just opened bingo back up yesterday.  She isn't doing any activities.  She feels depressed.  Current prescribed movement disorder medications: Carbidopa/levodopa 25/100, 1 tablet 6 times daily (instead of 2 tablets 3 times daily to see if it helped for facial dyskinesia)   PREVIOUS MEDICATIONS: Rasagiline (confusion)  ALLERGIES:   Allergies  Allergen Reactions   Lyrica [Pregabalin] Other (See Comments)    Felt loopy   Zocor [Simvastatin] Other (See Comments)    Myalgias   Relafen [Nabumetone] Rash    CURRENT MEDICATIONS:  Outpatient Encounter Medications as of 03/24/2022  Medication Sig   acetaminophen (TYLENOL 8 HOUR) 650 MG CR tablet Take 1 tablet (650 mg total) by mouth every 8 (eight) hours as needed for pain.   carbidopa-levodopa (SINEMET IR) 25-100 MG tablet Take 2 tablets by mouth 3 (three) times daily. 2 at 7am/2 at 11am/2 at 4pm   Cholecalciferol 50 MCG (2000 UT) CAPS Take 4,000  Units by mouth daily.   DULoxetine (CYMBALTA) 60 MG capsule Take 60 mg by mouth 2 (two) times daily with a meal.   flecainide (TAMBOCOR) 50 MG tablet Take 50 mg by mouth daily.   furosemide (LASIX) 20 MG tablet TAKE 1 TABLET(20 MG) BY MOUTH DAILY (Patient taking differently: Take 20 mg by mouth daily.)   gabapentin (NEURONTIN) 300 MG capsule Take 300-600 mg by mouth See admin instructions. Take one capsule (300 mg) by mouth daily with lunch and supper, and take two capsules (600 mg) at bedtime.   Menthol, Topical Analgesic, (BIOFREEZE EX) Apply 1 application topically daily as needed (pain).    metoprolol succinate (TOPROL-XL) 25 MG 24 hr tablet Take 12.5 mg by mouth daily.   NON FORMULARY Gaba '750mg'$  take 1 tab once a day    pantoprazole (PROTONIX) 40 MG tablet Take 40 mg by mouth daily.    polyethylene glycol (MIRALAX / GLYCOLAX) packet Take 17 g by mouth daily. (Patient taking differently: Take 17 g by mouth daily as needed for mild constipation.)   potassium chloride SA (K-DUR) 20 MEQ tablet Take 1 tablet (20 mEq total) by mouth daily.   pravastatin (PRAVACHOL) 40 MG tablet Take 40 mg by mouth daily with lunch.   TRAMADOL HCL ER PO Take 25 mg by mouth as needed.   TRAVATAN Z 0.004 % SOLN ophthalmic solution Place 1 drop into both eyes at bedtime.    No facility-administered encounter medications on file as of 03/24/2022.    Objective:   PHYSICAL EXAMINATION:    VITALS:   There were no vitals filed for this visit.    GEN:  The patient appears stated age and is in NAD.  She is tearful at times. HEENT:  Normocephalic, atraumatic.  The mucous membranes are moist. The superficial temporal arteries are without ropiness or tenderness. CV:  RRR Lungs:  CTAB Neck/HEME:  There are no carotid bruits bilaterally.  Neurological examination:  Orientation: The patient is alert and oriented x3. Cranial nerves: There is good facial symmetry with facial hypomimia.  She does have some square wave jerks.  The speech is fluent and clear. Soft palate rises symmetrically and there is no tongue deviation. Hearing is intact to conversational tone. Sensation: Sensation is intact to light touch throughout Motor: Strength is at least antigravity x4.  Movement examination: Tone: There is normal tone in the upper extremities Abnormal movements: mild head dyskinesia, only when distracted and asked to do other movements. Coordination:  There is mild decremation with RAM's Gait and Station: Not tested as patient is not walking (she is working on transfers with therapy).     I have reviewed and interpreted the following labs independently    Chemistry      Component Value Date/Time   NA 140 08/16/2020 1715   K 4.3  08/16/2020 1715   CL 101 08/16/2020 1715   CO2 31 08/16/2020 1715   BUN 19 08/16/2020 1715   CREATININE 0.98 08/16/2020 1715   CREATININE 0.93 01/11/2019 1547      Component Value Date/Time   CALCIUM 9.5 08/16/2020 1715   ALKPHOS 94 06/07/2020 0359   AST 20 06/07/2020 0359   ALT 10 06/07/2020 0359   BILITOT 0.3 06/07/2020 0359       Lab Results  Component Value Date   WBC 7.8 08/16/2020   HGB 13.5 08/16/2020   HCT 43.7 08/16/2020   MCV 92.4 08/16/2020   PLT 190 08/16/2020    Lab Results  Component  Value Date   TSH 1.298 12/25/2019     Total time spent on today's visit was *** minutes, including both face-to-face time and nonface-to-face time.  Time included that spent on review of records (prior notes available to me/labs/imaging if pertinent), discussing treatment and goals, answering patient's questions and coordinating care.  Cc:  [name], MD

## 2022-03-24 ENCOUNTER — Encounter: Payer: Self-pay | Admitting: Neurology

## 2022-03-24 ENCOUNTER — Telehealth: Payer: HMO | Admitting: Neurology

## 2022-03-24 VITALS — BP 122/78 | HR 78 | Ht 66.0 in | Wt 192.0 lb

## 2022-03-24 DIAGNOSIS — G231 Progressive supranuclear ophthalmoplegia [Steele-Richardson-Olszewski]: Secondary | ICD-10-CM

## 2022-04-07 ENCOUNTER — Other Ambulatory Visit: Payer: Self-pay

## 2022-04-07 DIAGNOSIS — I739 Peripheral vascular disease, unspecified: Secondary | ICD-10-CM

## 2022-04-09 ENCOUNTER — Ambulatory Visit (HOSPITAL_COMMUNITY)
Admission: RE | Admit: 2022-04-09 | Discharge: 2022-04-09 | Disposition: A | Discharge status: Home / Self Care | Payer: HMO | Source: Ambulatory Visit | Attending: Vascular Surgery | Admitting: Vascular Surgery

## 2022-04-09 DIAGNOSIS — I739 Peripheral vascular disease, unspecified: Secondary | ICD-10-CM | POA: Insufficient documentation

## 2022-04-14 ENCOUNTER — Ambulatory Visit: Payer: HMO | Admitting: Vascular Surgery

## 2022-04-14 ENCOUNTER — Encounter: Payer: Self-pay | Admitting: Vascular Surgery

## 2022-04-14 VITALS — BP 110/71 | HR 53 | Temp 98.2°F | Resp 20 | Ht 66.0 in | Wt 192.0 lb

## 2022-04-14 DIAGNOSIS — S91301A Unspecified open wound, right foot, initial encounter: Secondary | ICD-10-CM

## 2022-04-14 NOTE — Progress Notes (Signed)
Patient ID: [name], female   DOB: 1940/10/19, 81 y.o.   MRN: [id number]  Reason for Consult: New Patient (Initial Visit)   Referred by [name] Lopes, MD  Subjective:     HPI:  [name] is a 81 y.o. female without significant vascular history [facility]  She has a wound on her right heel that has been present for several months that is getting adequate wound care has not had any infection there.  She also has purple discoloration of the left foot with a foot drop on the left and pain in her left toes and she thinks that she had someone stepped on her foot in the past.  She also has deformity of the left as.  She does not have any ulcers on the left side.  She really has pain in both legs with some swelling of both legs as well but no venous ulceration no previous history of venous intervention.  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   GERD (gastroesophageal reflux disease)    Hyperlipidemia    Hypertension    Lumbar stenosis    OSA (obstructive sleep apnea)    "couldn't handle CPAP; use mouth guard some; not all the time" (01/06/2014)   PAF (paroxysmal atrial fibrillation) (HCC)    Scoliosis    with radiculopathy L2-S1 with prior surgery   Small bowel obstruction (HCC)    versus ileus after last bck surgery   Spondylosis    Family History  Problem Relation Age of Onset   Arthritis Mother    Heart attack Father    Hypertension Sister    Lung cancer Sister    Asthma Sister    Allergies Sister    Past Surgical History:  Procedure Laterality Date   BACK SURGERY     CATARACT EXTRACTION W/ INTRAOCULAR LENS  IMPLANT, BILATERAL  2011   CHOLECYSTECTOMY N/A 07/07/2018   Procedure: LAPAROSCOPIC CHOLECYSTECTOMY;  Surgeon: Erroll Luna, MD;  Location: [facility];  Service: General;  Laterality: N/A;   COLONOSCOPY  12/09/2004   LIPOMA EXCISION  1980's   "fatty tumors"   [place]  02/2009   "ruptured disc"   NASAL SEPTUM SURGERY  80's   POSTERIOR LUMBAR FUSION  06/2010; 10/2011   "placed screws, rods, spacers both times"   POSTERIOR LUMBAR FUSION 4 LEVEL N/A 12/13/2018   Procedure: THORACIC NINE -LUMBAR THREE POSTERIOR INSTRUMENTATION FUSION;  Surgeon: Judith Part, MD;  Location: [facility];  Service: Neurosurgery;  Laterality: N/A;   REPAIR DURAL / CSF LEAK  02/2009   THORACIC LAMINECTOMY FOR EPIDURAL ABSCESS N/A 09/28/2018   Procedure: THORACIC NINE-THORACIC TEN, THORACIC TEN-THORACIC ELEVEN, THORACIC ELEVEN-THORACIC TWELVE LAMINECTOMIES FOR EPIDURAL ABSCESS;  Surgeon: Judith Part, MD;  Location: [facility];  Service: Neurosurgery;  Laterality: N/A;   TOTAL ABDOMINAL HYSTERECTOMY  02/1993    Short Social History:  Social History   Tobacco Use   Smoking status: Never   Smokeless tobacco: Never  Substance Use Topics   Alcohol use: No    Alcohol/week: 0.0 standard drinks of alcohol    Allergies  Allergen Reactions   Lyrica [Pregabalin] Other (See Comments)    Felt loopy   Zocor [Simvastatin] Other (See  Comments)    Myalgias   Relafen [Nabumetone] Rash    Current Outpatient Medications  Medication Sig Dispense Refill   acetaminophen (TYLENOL 8 HOUR) 650 MG CR tablet Take 1 tablet (650 mg total) by mouth every 8 (eight) hours as needed for pain. 20 tablet 0   carbidopa-levodopa (SINEMET IR) 25-100 MG tablet Take 2 tablets by mouth 3 (three) times daily. 2 at 7am/2 at 11am/2 at 4pm 540 tablet 1   Cholecalciferol 50 MCG (2000 UT) CAPS Take 4,000 Units by mouth daily.     ELIQUIS 2.5 MG TABS tablet      flecainide (TAMBOCOR) 50 MG tablet Take 50 mg by mouth daily.     furosemide (LASIX) 20 MG tablet TAKE 1 TABLET(20 MG) BY MOUTH DAILY (Patient taking differently: Take 20 mg by mouth daily.) 90 tablet 3    gabapentin (NEURONTIN) 300 MG capsule Take 300 mg by mouth See admin instructions. 2 capsule by mouth 3 times a day for pain     loratadine (CLARITIN) 10 MG tablet Take 10 mg by mouth daily.     Menthol, Topical Analgesic, (BIOFREEZE EX) Apply 1 application topically daily as needed (pain).      metoprolol succinate (TOPROL-XL) 25 MG 24 hr tablet Take 12.5 mg by mouth daily.     NON FORMULARY Gaba '750mg'$  take 1 tab once a day     pantoprazole (PROTONIX) 40 MG tablet Take 40 mg by mouth daily.      polyethylene glycol (MIRALAX / GLYCOLAX) packet Take 17 g by mouth daily. (Patient taking differently: Take 17 g by mouth daily as needed for mild constipation.) 14 each 0   potassium chloride SA (K-DUR) 20 MEQ tablet Take 1 tablet (20 mEq total) by mouth daily. 90 tablet 3   pravastatin (PRAVACHOL) 40 MG tablet Take 40 mg by mouth daily with lunch.     TRAMADOL HCL ER PO Take 25 mg by mouth as needed.     TRAVATAN Z 0.004 % SOLN ophthalmic solution Place 1 drop into both eyes at bedtime.   0   DULoxetine (CYMBALTA) 60 MG capsule Take 60 mg by mouth 2 (two) times daily with a meal. (Patient not taking: Reported on 03/24/2022)     No current facility-administered medications for this visit.    Review of Systems  Constitutional:  Constitutional negative. HENT: HENT negative.  Eyes: Eyes negative.  Cardiovascular: Positive for leg swelling.  GI: Gastrointestinal negative.  Musculoskeletal: Positive for leg pain.  Skin: Positive for wound.  Neurological: Neurological negative. Hematologic: Hematologic/lymphatic negative.  Psychiatric: Psychiatric negative.        Objective:  Objective   Vitals:   04/14/22 1011  BP: 110/71  Pulse: (!) 53  Resp: 20  Temp: 98.2 F (36.8 C)  SpO2: 93%  Weight: 192 lb (87.1 kg)  Height: '5\' 6"'$  (1.676 m)   Body mass index is 30.99 kg/m.  Physical Exam HENT:     Head: Normocephalic.  Eyes:     Pupils: Pupils are equal, round, and reactive to light.   Cardiovascular:     Pulses:          Posterior tibial pulses are 2+ on the right side and 2+ on the left side.  Pulmonary:     Effort: Pulmonary effort is normal.  Abdominal:     General: Abdomen is flat.     Palpations: Abdomen is soft.  Skin:    Capillary Refill: Capillary refill takes less than 2 seconds.  Comments: Purple discoloration of left foot, right heel ulceration with granulation tissue no evidence of cellulitis  Neurological:     Mental Status: She is alert.  Psychiatric:        Mood and Affect: Mood normal.        Thought Content: Thought content normal.     Data: ABI Findings:  +---------+------------------+-----+---------+--------+  Right   Rt Pressure (mmHg)IndexWaveform Comment   +---------+------------------+-----+---------+--------+  Brachial 144                                       +---------+------------------+-----+---------+--------+  PTA     160               1.07 triphasic          +---------+------------------+-----+---------+--------+  DP      138               0.93 biphasic           +---------+------------------+-----+---------+--------+  Great Toe124               0.83                    +---------+------------------+-----+---------+--------+   +---------+------------------+-----+----------+---------------+  Left    Lt Pressure (mmHg)IndexWaveform  Comment          +---------+------------------+-----+----------+---------------+  Brachial 149                                               +---------+------------------+-----+----------+---------------+  PTA     171               1.15 triphasic                  +---------+------------------+-----+----------+---------------+  DP      169               1.13 monophasicsounds biphasic  +---------+------------------+-----+----------+---------------+  Great Toe135               0.91                             +---------+------------------+-----+----------+---------------+   +-------+-----------+-----------+------------+------------+  ABI/TBIToday's ABIToday's TBIPrevious ABIPrevious TBI  +-------+-----------+-----------+------------+------------+  Right 1.07       0.83                                 +-------+-----------+-----------+------------+------------+  Left  1.15       0.91                                 +-------+-----------+-----------+------------+------------+        Assessment/Plan:    81 year old female presents with right heel ulceration purple discoloration of the left foot but her ABIs are preserved.  Palpable posterior tibial pulses bilaterally.  Continue meticulous wound care of the right heel and consider podiatry evaluation of the toes on the left and she can follow-up with me on an as-needed basis.     Waynetta Sandy MD [facility]

## 2022-06-11 DIAGNOSIS — L89613 Pressure ulcer of right heel, stage 3: Secondary | ICD-10-CM | POA: Diagnosis not present

## 2022-06-11 DIAGNOSIS — S91105A Unspecified open wound of left lesser toe(s) without damage to nail, initial encounter: Secondary | ICD-10-CM | POA: Diagnosis not present

## 2022-06-16 DIAGNOSIS — L89613 Pressure ulcer of right heel, stage 3: Secondary | ICD-10-CM | POA: Diagnosis not present

## 2022-06-17 DIAGNOSIS — G20C Parkinsonism, unspecified: Secondary | ICD-10-CM | POA: Diagnosis not present

## 2022-06-17 DIAGNOSIS — Z9181 History of falling: Secondary | ICD-10-CM | POA: Diagnosis not present

## 2022-06-17 DIAGNOSIS — R278 Other lack of coordination: Secondary | ICD-10-CM | POA: Diagnosis not present

## 2022-06-17 DIAGNOSIS — I5033 Acute on chronic diastolic (congestive) heart failure: Secondary | ICD-10-CM | POA: Diagnosis not present

## 2022-06-17 DIAGNOSIS — I5032 Chronic diastolic (congestive) heart failure: Secondary | ICD-10-CM | POA: Diagnosis not present

## 2022-06-17 DIAGNOSIS — R2681 Unsteadiness on feet: Secondary | ICD-10-CM | POA: Diagnosis not present

## 2022-06-17 DIAGNOSIS — M6281 Muscle weakness (generalized): Secondary | ICD-10-CM | POA: Diagnosis not present

## 2022-06-18 DIAGNOSIS — M5417 Radiculopathy, lumbosacral region: Secondary | ICD-10-CM | POA: Diagnosis not present

## 2022-06-18 DIAGNOSIS — M5416 Radiculopathy, lumbar region: Secondary | ICD-10-CM | POA: Diagnosis not present

## 2022-06-23 DIAGNOSIS — L89613 Pressure ulcer of right heel, stage 3: Secondary | ICD-10-CM | POA: Diagnosis not present

## 2022-06-30 DIAGNOSIS — L89613 Pressure ulcer of right heel, stage 3: Secondary | ICD-10-CM | POA: Diagnosis not present

## 2022-07-07 DIAGNOSIS — L89613 Pressure ulcer of right heel, stage 3: Secondary | ICD-10-CM | POA: Diagnosis not present

## 2022-07-12 DIAGNOSIS — N390 Urinary tract infection, site not specified: Secondary | ICD-10-CM | POA: Diagnosis not present

## 2022-07-14 DIAGNOSIS — L89613 Pressure ulcer of right heel, stage 3: Secondary | ICD-10-CM | POA: Diagnosis not present

## 2022-07-21 DIAGNOSIS — L89613 Pressure ulcer of right heel, stage 3: Secondary | ICD-10-CM | POA: Diagnosis not present

## 2022-07-27 DIAGNOSIS — B351 Tinea unguium: Secondary | ICD-10-CM | POA: Diagnosis not present

## 2022-07-27 DIAGNOSIS — I739 Peripheral vascular disease, unspecified: Secondary | ICD-10-CM | POA: Diagnosis not present

## 2022-07-28 DIAGNOSIS — S91105A Unspecified open wound of left lesser toe(s) without damage to nail, initial encounter: Secondary | ICD-10-CM | POA: Diagnosis not present

## 2022-07-28 DIAGNOSIS — L89613 Pressure ulcer of right heel, stage 3: Secondary | ICD-10-CM | POA: Diagnosis not present

## 2022-08-04 DIAGNOSIS — L89613 Pressure ulcer of right heel, stage 3: Secondary | ICD-10-CM | POA: Diagnosis not present

## 2022-08-04 DIAGNOSIS — S91105A Unspecified open wound of left lesser toe(s) without damage to nail, initial encounter: Secondary | ICD-10-CM | POA: Diagnosis not present

## 2022-08-05 ENCOUNTER — Telehealth: Payer: Self-pay

## 2022-08-05 DIAGNOSIS — Z1231 Encounter for screening mammogram for malignant neoplasm of breast: Secondary | ICD-10-CM | POA: Diagnosis not present

## 2022-08-05 NOTE — Telephone Encounter (Signed)
Patient's daughter is calling in, Antonietta's drop foot on L side has gotten so bad that Massa is almost tripping over due to how turned in it was. Wondering if they can move up their appointment to discuss this and some other issues.

## 2022-08-05 NOTE — Telephone Encounter (Signed)
Dr .Arturo Morton last note on this issue last time patients daughter called . PSP itself should not cause foot drop.  Back issues and peripheral issues can do that but not psp.   Called patients daughter and patient has been screened twice once in January and once in December patient is suffering from dehydration , confusion and hallucinations. PCP screened patient but refused to culture. Patients daughter said she is going to demand a culture. They did find bacteria but only put patient on a week on antibiotics and symptoms came right back. Patient getting monthly injections for her back but does not seem to help with her foot

## 2022-08-10 NOTE — Telephone Encounter (Signed)
Called patients daughter to check on patient

## 2022-08-11 DIAGNOSIS — L89613 Pressure ulcer of right heel, stage 3: Secondary | ICD-10-CM | POA: Diagnosis not present

## 2022-08-11 DIAGNOSIS — S91105A Unspecified open wound of left lesser toe(s) without damage to nail, initial encounter: Secondary | ICD-10-CM | POA: Diagnosis not present

## 2022-08-11 NOTE — Telephone Encounter (Signed)
Daughter returned phone call.

## 2022-08-11 NOTE — Telephone Encounter (Signed)
Called patients daughter back and left voicemail

## 2022-08-12 ENCOUNTER — Other Ambulatory Visit: Payer: Self-pay

## 2022-08-12 ENCOUNTER — Telehealth: Payer: Self-pay | Admitting: Anesthesiology

## 2022-08-12 DIAGNOSIS — M79605 Pain in left leg: Secondary | ICD-10-CM

## 2022-08-12 DIAGNOSIS — M21372 Foot drop, left foot: Secondary | ICD-10-CM

## 2022-08-12 NOTE — Telephone Encounter (Signed)
Pt returned Chelsea's call.

## 2022-08-12 NOTE — Telephone Encounter (Signed)
Called patients daughter and gave her my direct line to call me back

## 2022-08-18 DIAGNOSIS — L89613 Pressure ulcer of right heel, stage 3: Secondary | ICD-10-CM | POA: Diagnosis not present

## 2022-08-18 DIAGNOSIS — S91105A Unspecified open wound of left lesser toe(s) without damage to nail, initial encounter: Secondary | ICD-10-CM | POA: Diagnosis not present

## 2022-08-22 DIAGNOSIS — I10 Essential (primary) hypertension: Secondary | ICD-10-CM | POA: Diagnosis not present

## 2022-08-22 DIAGNOSIS — M79672 Pain in left foot: Secondary | ICD-10-CM | POA: Diagnosis not present

## 2022-08-22 DIAGNOSIS — I739 Peripheral vascular disease, unspecified: Secondary | ICD-10-CM | POA: Diagnosis not present

## 2022-08-22 DIAGNOSIS — M79671 Pain in right foot: Secondary | ICD-10-CM | POA: Diagnosis not present

## 2022-08-24 DIAGNOSIS — R52 Pain, unspecified: Secondary | ICD-10-CM | POA: Diagnosis not present

## 2022-08-25 DIAGNOSIS — S91105A Unspecified open wound of left lesser toe(s) without damage to nail, initial encounter: Secondary | ICD-10-CM | POA: Diagnosis not present

## 2022-08-25 DIAGNOSIS — L89613 Pressure ulcer of right heel, stage 3: Secondary | ICD-10-CM | POA: Diagnosis not present

## 2022-08-26 ENCOUNTER — Ambulatory Visit: Payer: HMO | Admitting: Neurology

## 2022-08-26 ENCOUNTER — Telehealth: Payer: Self-pay | Admitting: Neurology

## 2022-08-26 DIAGNOSIS — M79605 Pain in left leg: Secondary | ICD-10-CM

## 2022-08-26 DIAGNOSIS — M21372 Foot drop, left foot: Secondary | ICD-10-CM | POA: Diagnosis not present

## 2022-08-26 NOTE — Telephone Encounter (Signed)
Let pt/daughter know that foot drop is likely coming from the back, although the EMG was most certainly not ideal given the fact that she couldn't change positions and she was on anticoagulation (eliquis).  She has had a lot of back surgeries previously but given her psp and degree of disability, I don't think that we would pursue more surgery now so not sure its worth it to purse another MRI lumbar if we aren't going to consider "fixing" what we would find.  Let me know if they agree.  If not, we can certainly order the [facility]

## 2022-08-26 NOTE — Procedures (Signed)
[facility]  [address] [place], [address]  [place], [place] [address] Tel: [phone] Fax: [phone] Test Date:  08/26/2022  Patient: [name] DOB: Feb 17, 1941 Physician: Narda Amber, DO  Sex: Female Height: 5\' 6"  Ref Phys: Alonza Bogus, DO  ID#: [id number]   Technician:    History: This is a 82 year old female referred for evaluation of left foot drop.  NCV & EMG Findings: Extensive electrodiagnostic testing of the left lower extremity and additional studies of the right shows:  Bilateral superficial peroneal and left sural sensory responses are within normal limits. Bilateral peroneal motor responses at the extensor digitorum brevis are absent.  Left peroneal motor response at the tibialis anterior is asymmetrically reduced on the left (L1.0 mV) and normal on the right.  Tibial motor responses within normal limits on the left, and reduced on the right (R3.2 mV), where proximal stimulation was unable to be obtained due to body physiognomy and positioning.  Chronic motor axon loss changes are seen affecting bilateral tibialis anterior, medial gastrocnemius, and rectus femoris muscles.  These findings are severe in the left anterior tibialis.  Proximal and deep muscles were not tested as the patient is on anticoagulation therapy.  Impression: Electrophysiologic testing is limited by body physiognomy and positioning.  Findings are most suggestive of a multilevel chronic radiculopathy affecting the L4, L5, and S1 nerve root/segments, which is worse in the left lower extremity.    There is no evidence of a common peroneal mononeuropathy or large fiber sensorimotor polyneuropathy.   ___________________________ Narda Amber, DO    Nerve Conduction Studies   Stim Site NR Peak (ms) Norm Peak (ms) O-P Amp (V) Norm O-P Amp  Left Sup Peroneal Anti Sensory (Ant Lat Mall)  32 C  12 cm    2.8 <4.6 3.3 >3  Right Sup Peroneal Anti Sensory (Ant Lat Mall)  32 C  12 cm     3.3 <4.6 5.6 >3  Left Sural Anti Sensory (Lat Mall)  32 C  Calf    3.0 <4.6 6.3 >3     Stim Site NR Onset (ms) Norm Onset (ms) O-P Amp (mV) Norm O-P Amp Site1 Site2 Delta-0 (ms) Dist (cm) Vel (m/s) Norm Vel (m/s)  Left Peroneal Motor (Ext Dig Brev)  32 C  Ankle *NR  <6.0  >2.5 B Fib Ankle  0.0  >40  B Fib *NR     Poplt B Fib  0.0  >40  Poplt *NR            Right Peroneal Motor (Ext Dig Brev)  32 C  Ankle *NR  <6.0  >2.5 B Fib Ankle  0.0  >40  B Fib *NR     Poplt B Fib  0.0  >40  Poplt *NR            Left Peroneal TA Motor (Tib Ant)  32 C  Fib Head    3.8 <4.5 *1.0 >3 Poplit Fib Head 1.3 8.0 62 >40  Poplit    5.1 <5.7 0.9         Right Peroneal TA Motor (Tib Ant)  32 C  Fib Head    2.9 <4.5 3.4 >3 Poplit Fib Head 1.3 8.0 62 >40  Poplit    4.2 <5.7 3.3         Left Tibial Motor (Abd Hall Brev)  32 C  Ankle    3.6 <6.0 4.5 >4 Knee Ankle 10.2 41.0 40 >40  Knee    13.8  2.9         Right Tibial Motor (Abd Hall Brev)  32 C  Ankle    3.6 <6.0 *3.2 >4 Knee Ankle  0.0  >40  Knee *NR             Electromyography   Side Muscle Ins.Act Fibs Fasc Recrt Amp Dur Poly Activation Comment  Right AntTibialis Nml Nml Nml *1- *1+ *1+ *1+ Nml N/A  Right Gastroc Nml Nml Nml *1- *1+ *1+ *1+ Nml N/A  Right RectFemoris Nml Nml Nml *1- *1+ *1+ *1+ Nml N/A  Left AntTibialis Nml Nml Nml *SMU *1+ *1+ *1+ Nml N/A  Left Gastroc Nml Nml Nml *1- *1+ *1+ *1+ Nml N/A  Left RectFemoris Nml Nml Nml *1- *1+ *1+ *1+ Nml N/A      Waveforms:

## 2022-08-27 NOTE — Telephone Encounter (Signed)
Called Daughter left voicemail

## 2022-08-31 NOTE — Telephone Encounter (Signed)
Mychart message sent.

## 2022-08-31 NOTE — Telephone Encounter (Signed)
Called daughter left voicemail

## 2022-09-01 DIAGNOSIS — S91105A Unspecified open wound of left lesser toe(s) without damage to nail, initial encounter: Secondary | ICD-10-CM | POA: Diagnosis not present

## 2022-09-01 DIAGNOSIS — L89613 Pressure ulcer of right heel, stage 3: Secondary | ICD-10-CM | POA: Diagnosis not present

## 2022-09-08 DIAGNOSIS — S91105A Unspecified open wound of left lesser toe(s) without damage to nail, initial encounter: Secondary | ICD-10-CM | POA: Diagnosis not present

## 2022-09-08 DIAGNOSIS — L89613 Pressure ulcer of right heel, stage 3: Secondary | ICD-10-CM | POA: Diagnosis not present

## 2022-09-15 DIAGNOSIS — L89613 Pressure ulcer of right heel, stage 3: Secondary | ICD-10-CM | POA: Diagnosis not present

## 2022-09-15 DIAGNOSIS — S91105A Unspecified open wound of left lesser toe(s) without damage to nail, initial encounter: Secondary | ICD-10-CM | POA: Diagnosis not present

## 2022-09-22 DIAGNOSIS — S91105A Unspecified open wound of left lesser toe(s) without damage to nail, initial encounter: Secondary | ICD-10-CM | POA: Diagnosis not present

## 2022-09-22 DIAGNOSIS — L89613 Pressure ulcer of right heel, stage 3: Secondary | ICD-10-CM | POA: Diagnosis not present

## 2022-09-29 DIAGNOSIS — H532 Diplopia: Secondary | ICD-10-CM | POA: Diagnosis not present

## 2022-09-29 DIAGNOSIS — L89613 Pressure ulcer of right heel, stage 3: Secondary | ICD-10-CM | POA: Diagnosis not present

## 2022-09-29 DIAGNOSIS — H401131 Primary open-angle glaucoma, bilateral, mild stage: Secondary | ICD-10-CM | POA: Diagnosis not present

## 2022-10-02 DIAGNOSIS — I5032 Chronic diastolic (congestive) heart failure: Secondary | ICD-10-CM | POA: Diagnosis not present

## 2022-10-06 DIAGNOSIS — L89613 Pressure ulcer of right heel, stage 3: Secondary | ICD-10-CM | POA: Diagnosis not present

## 2022-10-13 DIAGNOSIS — L89613 Pressure ulcer of right heel, stage 3: Secondary | ICD-10-CM | POA: Diagnosis not present

## 2022-10-15 DIAGNOSIS — M5417 Radiculopathy, lumbosacral region: Secondary | ICD-10-CM | POA: Diagnosis not present

## 2022-10-15 DIAGNOSIS — M5416 Radiculopathy, lumbar region: Secondary | ICD-10-CM | POA: Diagnosis not present

## 2022-10-20 DIAGNOSIS — L89613 Pressure ulcer of right heel, stage 3: Secondary | ICD-10-CM | POA: Diagnosis not present

## 2022-10-27 DIAGNOSIS — L89613 Pressure ulcer of right heel, stage 3: Secondary | ICD-10-CM | POA: Diagnosis not present

## 2022-11-02 DIAGNOSIS — L84 Corns and callosities: Secondary | ICD-10-CM | POA: Diagnosis not present

## 2022-11-02 DIAGNOSIS — I739 Peripheral vascular disease, unspecified: Secondary | ICD-10-CM | POA: Diagnosis not present

## 2022-11-02 DIAGNOSIS — B351 Tinea unguium: Secondary | ICD-10-CM | POA: Diagnosis not present

## 2022-11-03 DIAGNOSIS — L89613 Pressure ulcer of right heel, stage 3: Secondary | ICD-10-CM | POA: Diagnosis not present

## 2022-11-06 DIAGNOSIS — Z79899 Other long term (current) drug therapy: Secondary | ICD-10-CM | POA: Diagnosis not present

## 2022-11-09 NOTE — Progress Notes (Signed)
Assessment/Plan:   1.  PSP             -Patient understands differences between PSP and Parkinson's disease.  Understands that there is an increased risk of falls, aspiration as subsequent morbidity and mortality from this disease compared to Parkinson's disease.             -Unclear why facility increased the levodopa.  Decrease carbidopa/levodopa 25/100, from 3 po tid to 3/2/2 x 2 weeks and then decrease to 2 at 8am/noon/4pm (still with facial dyskinesia)     2.  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     3.  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               -I believe they took her off of cymbalta due to restarting the eliquis and interaction with the two             -currently on gabapentin 600 mg tid             -on ultram bid (9pm and 5am).  Not getting confused per daughter  45.  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  She does not disagree.  She is already doing injections via pain management ([facility]).   She is on gabapentin and voltaren gel.  She is also on ultram.  Her biggest complaint is really not the foot drop, but the leg pain.  She is already following with pain management and needs to continue following with them.  6.  Diplopia  -awaiting permanent prism (currently with a temporary prism) Subjective:   [name] was seen today in follow up for PSP.  My previous records were reviewed prior to todays visit as well as outside records available to me.  Pt with daughter who supplements hx. She has had no falls.  She has had no hallucinations.   Family called at the end of February regarding foot drop.  We discussed with family that this would not be coming from PSP, but it could be coming from her back issues.  They wanted to do the EMG which demonstrated bilateral absence of the peroneal motor responses.  Electrophysiologic testing, however, was limited (due to patient positioning and the fact that she is on anticoagulation) but testing suggested multilevel chronic radiculopathy affecting the L4, L5 and S1 nerve root segments, most affecting the left leg.  We sent them a MyChart message about the results, and did note that given the patient's degree of disability, we likely would not pursue further surgery.  They did not read it, so report today that they didn't get it.  Pt states that the issue is pain from the knee down - "its like it has gone to sleep."  It hurts day and night.  She is doing injections currently   Separately, her SNF reports that carbidopa/levodopa is given 3 po tid.  This is not how we have RX this.  They are unsure why it was  increased.  She does have diplopia and has a temporary prism x 2-3 weeks and it has helped some (digby eye).  She was dropping food and not being able to sign checks b/c of vision issues but that isn't an issue now.  Current prescribed movement disorder medications: Carbidopa/levodopa 25/100, 1 tablet 6 times daily (instead of 2 tablets 3 times daily to see if it helped for facial dyskinesia)   PREVIOUS MEDICATIONS: Rasagiline (confusion)  ALLERGIES:   Allergies  Allergen Reactions   Lyrica [Pregabalin] Other (See Comments)    Felt loopy   Zocor [Simvastatin] Other (See Comments)    Myalgias   Relafen [Nabumetone] Rash    CURRENT MEDICATIONS:  Outpatient Encounter Medications as of 11/22/2022  Medication Sig   acetaminophen (TYLENOL 8 HOUR) 650 MG CR tablet Take 1 tablet (650 mg total) by mouth every 8 (eight) hours as needed for pain.   B Complex-Folic Acid (SUPER B COMPLEX MAXI) TABS  Take by mouth.   Cholecalciferol 50 MCG (2000 UT) CAPS Take 4,000 Units by mouth daily.   cilostazol (PLETAL) 50 MG tablet Take 50 mg by mouth 2 (two) times daily.   ELIQUIS 2.5 MG TABS tablet    flecainide (TAMBOCOR) 50 MG tablet Take 50 mg by mouth daily.   furosemide (LASIX) 20 MG tablet TAKE 1 TABLET(20 MG) BY MOUTH DAILY (Patient taking differently: Take 20 mg by mouth daily.)   gabapentin (NEURONTIN) 300 MG capsule Take 300 mg by mouth See admin instructions. 2 capsule by mouth 3 times a day for pain   loratadine (CLARITIN) 10 MG tablet Take 10 mg by mouth daily.   metoprolol succinate (TOPROL-XL) 25 MG 24 hr tablet Take 12.5 mg by mouth daily.   NON FORMULARY Gaba 750mg  take 1 tab once a day   polyethylene glycol (MIRALAX / GLYCOLAX) packet Take 17 g by mouth daily. (Patient taking differently: Take 17 g by mouth daily as needed for mild constipation.)   potassium chloride SA (K-DUR) 20 MEQ tablet Take 1 tablet (20 mEq total) by mouth daily.   pravastatin (PRAVACHOL) 40 MG tablet Take 40 mg by mouth daily with lunch.   TRAMADOL HCL ER PO Take 25 mg by mouth as needed.   TRAVATAN Z 0.004 % SOLN ophthalmic solution Place 1 drop into both eyes at bedtime.    [DISCONTINUED] carbidopa-levodopa (SINEMET IR) 25-100 MG tablet Take 2 tablets by mouth 3 (three) times daily. 2 at 7am/2 at 11am/2 at 4pm (Patient taking differently: Take 3 tablets by mouth 3 (three) times daily. 3 at 7am/3 at 11am/3 at 4pm)   carbidopa-levodopa (SINEMET IR) 25-100 MG tablet 3 at 8am/2 at noon/2 at 4pm x 2 weeks and then take 2 tablets at 8am/noon/4pm   DULoxetine (CYMBALTA) 60 MG capsule Take 60 mg by mouth 2 (two) times daily with a meal. (Patient not taking: Reported on 03/24/2022)   Menthol, Topical Analgesic, (BIOFREEZE EX) Apply 1 application topically daily as needed (pain).  (Patient not taking: Reported on 11/22/2022)   pantoprazole (PROTONIX) 40 MG tablet Take 40 mg by mouth daily.  (Patient not taking: Reported  on 11/22/2022)   No facility-administered encounter medications on file as of 11/22/2022.    Objective:   PHYSICAL EXAMINATION:    VITALS:   Vitals:   11/22/22 1335  BP: 126/80  Pulse: 61  SpO2: 98%  Weight: 210 lb (95.3 kg)  Height: 5\' 7"  (1.702 m)      GEN:  The patient appears stated age  and is in NAD.  She is intermittently tearful, but most of the time she has not HEENT:  Normocephalic, atraumatic.  The mucous membranes are moist. The superficial temporal arteries are without ropiness or tenderness. CV:  RRR Lungs:  CTAB Neck/HEME:  There are no carotid bruits bilaterally.  Neurological examination:  Orientation: The patient is alert and oriented x3. Cranial nerves: There is good facial symmetry with facial hypomimia.  She does have some square wave jerks.  Has downgaze paresis.   The speech is fluent and clear. Soft palate rises symmetrically and there is no tongue deviation. Hearing is intact to conversational tone. Sensation: Sensation is intact to light touch throughout Motor: Strength is at least antigravity x4.  She has left foot drop, but is wearing her brace.  Movement examination: Tone: There is normal tone in the upper extremities Abnormal movements: Mild to moderate facial dyskinesia. Coordination:  There is mild decremation with RAM's Gait and Station: Not tested as patient is not walking (she is working on transfers with therapy).     I have reviewed and interpreted the following labs independently    Chemistry      Component Value Date/Time   NA 140 08/16/2020 1715   K 4.3 08/16/2020 1715   CL 101 08/16/2020 1715   CO2 31 08/16/2020 1715   BUN 19 08/16/2020 1715   CREATININE 0.98 08/16/2020 1715   CREATININE 0.93 01/11/2019 1547      Component Value Date/Time   CALCIUM 9.5 08/16/2020 1715   ALKPHOS 94 06/07/2020 0359   AST 20 06/07/2020 0359   ALT 10 06/07/2020 0359   BILITOT 0.3 06/07/2020 0359       Lab Results  Component Value Date    WBC 7.8 08/16/2020   HGB 13.5 08/16/2020   HCT 43.7 08/16/2020   MCV 92.4 08/16/2020   PLT 190 08/16/2020    Lab Results  Component Value Date   TSH 1.298 12/25/2019     Total time spent on today's visit was 34 minutes, including both face-to-face time and nonface-to-face time.  Time included that spent on review of records (prior notes available to me/labs/imaging if pertinent), discussing treatment and goals, answering patient's questions and coordinating care.  Cc:  Garlan Fillers, MD

## 2022-11-10 DIAGNOSIS — L89613 Pressure ulcer of right heel, stage 3: Secondary | ICD-10-CM | POA: Diagnosis not present

## 2022-11-22 ENCOUNTER — Encounter: Payer: Self-pay | Admitting: Neurology

## 2022-11-22 ENCOUNTER — Ambulatory Visit (INDEPENDENT_AMBULATORY_CARE_PROVIDER_SITE_OTHER): Payer: PPO | Admitting: Neurology

## 2022-11-22 VITALS — BP 126/80 | HR 61 | Ht 67.0 in | Wt 210.0 lb

## 2022-11-22 DIAGNOSIS — G231 Progressive supranuclear ophthalmoplegia [Steele-Richardson-Olszewski]: Secondary | ICD-10-CM | POA: Diagnosis not present

## 2022-11-22 DIAGNOSIS — M5416 Radiculopathy, lumbar region: Secondary | ICD-10-CM

## 2022-11-22 MED ORDER — CARBIDOPA-LEVODOPA 25-100 MG PO TABS: ORAL_TABLET | ORAL | 1 refills | Status: AC

## 2022-11-22 NOTE — Patient Instructions (Signed)
Note to [facility]:    Please do not change carbidopa/levodopa without calling us/letting our office know  Decrease carbidopa/levodopa 25/100, 3 at 8am, 2 at noon, 2 at 4pm x 2 weeks, then 2 tablets at 8am/noon/4pm  The physicians and staff at [facility] are committed to providing excellent care. You may receive a survey requesting feedback about your experience at our office. We strive to receive "very good" responses to the survey questions. If you feel that your experience would prevent you from giving the office a "very good " response, please contact our office to try to remedy the situation. We may be reached at [phone]. Thank you for taking the time out of your busy day to complete the survey.

## 2022-12-11 DIAGNOSIS — I739 Peripheral vascular disease, unspecified: Secondary | ICD-10-CM | POA: Diagnosis not present

## 2022-12-11 DIAGNOSIS — M79671 Pain in right foot: Secondary | ICD-10-CM | POA: Diagnosis not present

## 2022-12-11 DIAGNOSIS — M79672 Pain in left foot: Secondary | ICD-10-CM | POA: Diagnosis not present

## 2022-12-11 DIAGNOSIS — G20A1 Parkinson's disease without dyskinesia, without mention of fluctuations: Secondary | ICD-10-CM | POA: Diagnosis not present

## 2022-12-20 DIAGNOSIS — H532 Diplopia: Secondary | ICD-10-CM | POA: Diagnosis not present

## 2022-12-20 DIAGNOSIS — H524 Presbyopia: Secondary | ICD-10-CM | POA: Diagnosis not present

## 2022-12-23 ENCOUNTER — Ambulatory Visit: Payer: HMO | Admitting: Neurology

## 2023-01-05 NOTE — Progress Notes (Signed)
Office Visit    Patient Name: [name] Date of Encounter: 01/10/2023  PCP:  Garlan Fillers, MD   [facility]  Cardiologist:  Charlton Haws, MD  Advanced Practice Provider:  No care team member to display Electrophysiologist:  None    Chief Complaint    [name] is a 82 y.o. female with a hx of hypertension, OSA with intolerance to CPAP, hyperlipidemia, and PAF presents today for follow-up appointment. I have not seen her in close to 4 years   Underwent cardiac catheterization 07/2000 which showed normal coronaries.  Echocardiogram 2015 showed normal LV function with EF 66 5% and normal wall motion.  Myoview 02/2014 showed normal EF and no ischemia.  On flecainide and low dose eliquis for her PAF Anticoagulation subsequently stopped due to PSP and falls  She was admitted 2019 with discitis/osteomyelitis with concerns of abscess versus old hematoma.  Now status post total fasciectomy, revision of all construct and new fusion construct of T9-L3 by neurosurgery.  She was seen 12/2018 and was experiencing some orthostatic hypotension.  Metoprolol and Lasix were decreased at this time.  Parkinson's followed by Dr Tat Multiple falls Now diagnosed with PSP and is at [facility] Foot drop on left wearing brace as well from back issues   Apparently [facility] has her back on low dose eliquis. 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   GERD (gastroesophageal reflux disease)    Hyperlipidemia    Hypertension    Lumbar stenosis     OSA (obstructive sleep apnea)    "couldn't handle CPAP; use mouth guard some; not all the time" (01/06/2014)   PAF (paroxysmal atrial fibrillation) (HCC)    Scoliosis    with radiculopathy L2-S1 with prior surgery   Small bowel obstruction (HCC)    versus ileus after last bck surgery   Spondylosis    Past Surgical History:  Procedure Laterality Date   BACK SURGERY     CATARACT EXTRACTION W/ INTRAOCULAR LENS  IMPLANT, BILATERAL  2011   CHOLECYSTECTOMY N/A 07/07/2018   Procedure: LAPAROSCOPIC CHOLECYSTECTOMY;  Surgeon: Harriette Bouillon, MD;  Location: [facility] OR;  Service: General;  Laterality: N/A;   COLONOSCOPY  12/09/2004   LIPOMA EXCISION  1980's   "fatty tumors"   LUMBAR DISC SURGERY  02/2009   "ruptured disc"   NASAL SEPTUM SURGERY  80's   POSTERIOR LUMBAR FUSION  06/2010; 10/2011   "placed screws, rods, spacers both times"   POSTERIOR LUMBAR FUSION 4 LEVEL N/A 12/13/2018   Procedure: THORACIC NINE -LUMBAR THREE POSTERIOR INSTRUMENTATION FUSION;  Surgeon: Jadene Pierini, MD;  Location: [facility] OR;  Service: Neurosurgery;  Laterality: N/A;   REPAIR DURAL / CSF LEAK  02/2009   THORACIC LAMINECTOMY FOR EPIDURAL ABSCESS N/A 09/28/2018   Procedure: THORACIC NINE-THORACIC TEN, THORACIC TEN-THORACIC ELEVEN, THORACIC ELEVEN-THORACIC TWELVE LAMINECTOMIES FOR EPIDURAL ABSCESS;  Surgeon: Jadene Pierini, MD;  Location: [facility] OR;  Service:  Neurosurgery;  Laterality: N/A;   TOTAL ABDOMINAL HYSTERECTOMY  02/1993    Allergies  Allergies  Allergen Reactions   Lyrica [Pregabalin] Other (See Comments)    Felt loopy   Zocor [Simvastatin] Other (See Comments)    Myalgias   Relafen [Nabumetone] Rash   EKGs/Labs/Other Studies Reviewed:   The following studies were reviewed today:  US Carotids bilaterally 04/22/2016  IMPRESSION: Minor carotid atherosclerosis. No hemodynamically significant ICA stenosis. Degree of narrowing less than 50% bilaterally.   Patent antegrade vertebral flow  bilaterally  EKG:  EKG is not ordered today.    Recent Labs: No results found for requested labs within last 365 days.  Recent Lipid Panel    Component Value Date/Time   CHOL  12/22/2006 0550    183        ATP III CLASSIFICATION:  <200     mg/dL   Desirable  962-952  mg/dL   Borderline High  >=841    mg/dL   High   TRIG 324 (H) 40/03/2724 0550   HDL 47 12/22/2006 0550   CHOLHDL 3.9 12/22/2006 0550   VLDL 35 12/22/2006 0550   LDLCALC (H) 12/22/2006 0550    101        Total Cholesterol/HDL:CHD Risk Coronary Heart Disease Risk Table                     Men   Women  1/2 Average Risk   3.4   3.3    Risk Assessment/Calculations:   CHA2DS2-VASc Score =     This indicates a  % annual risk of stroke. The patient's score is based upon:       Home Medications   Current Meds  Medication Sig   acetaminophen (TYLENOL 8 HOUR) 650 MG CR tablet Take 1 tablet (650 mg total) by mouth every 8 (eight) hours as needed for pain.   B Complex-Folic Acid (SUPER B COMPLEX MAXI) TABS Take by mouth.   carbidopa-levodopa (SINEMET IR) 25-100 MG tablet 3 at 8am/2 at noon/2 at 4pm x 2 weeks and then take 2 tablets at 8am/noon/4pm   Cholecalciferol 50 MCG (2000 UT) CAPS Take 4,000 Units by mouth daily.   cilostazol (PLETAL) 50 MG tablet Take 50 mg by mouth 2 (two) times daily.   ELIQUIS 2.5 MG TABS tablet    flecainide (TAMBOCOR) 50 MG tablet Take 50 mg by mouth daily.   furosemide (LASIX) 20 MG tablet TAKE 1 TABLET(20 MG) BY MOUTH DAILY (Patient taking differently: Take 20 mg by mouth daily.)   gabapentin (NEURONTIN) 300 MG capsule Take 300 mg by mouth See admin instructions. 2 capsule by mouth 3 times a day for pain   loratadine (CLARITIN) 10 MG tablet Take 10 mg by mouth daily.   Menthol, Topical Analgesic, (BIOFREEZE EX) Apply 1 application  topically daily as needed (pain).   metoprolol succinate (TOPROL-XL) 25 MG 24 hr tablet Take 12.5 mg by mouth daily.   NON FORMULARY Gaba 750mg  take 1 tab  once a day   polyethylene glycol (MIRALAX / GLYCOLAX) packet Take 17 g by mouth daily. (Patient taking differently: Take 17 g by mouth daily as needed for mild constipation.)   potassium chloride SA (K-DUR) 20 MEQ tablet Take 1 tablet (20 mEq total) by mouth daily.   pravastatin (PRAVACHOL) 40 MG tablet Take 40 mg by mouth daily with lunch.   TRAMADOL HCL ER PO Take 25 mg by mouth as needed.   TRAVATAN Z 0.004 %  SOLN ophthalmic solution Place 1 drop into both eyes at bedtime.      Review of Systems      All other systems reviewed and are otherwise negative except as noted above.  Physical Exam    VS:  BP 124/68   Pulse 87   Ht 5\' 7"  (1.702 m)   SpO2 98%   BMI 32.89 kg/m  , BMI Body mass index is 32.89 kg/m.  Wt Readings from Last 3 Encounters:  11/22/22 210 lb (95.3 kg)  04/14/22 192 lb (87.1 kg)  03/24/22 192 lb (87.1 kg)     GEN: Well nourished, well developed, in no acute distress. HEENT: normal. Neck: Supple, no JVD, carotid bruits, or masses. Cardiac: RRR, no murmurs, rubs, or gallops. No clubbing, cyanosis, 1+ non pitting edema.  Radials/PT 2+ and equal bilaterally.  Respiratory:  Respirations regular and unlabored, clear to auscultation bilaterally. GI: Soft, nontender, nondistended. MS: No deformity or atrophy. Skin: Warm and dry, no rash. Neuro:  Left foot drop   Psych: Normal affect.  Assessment & Plan    PAF -NSR today -She has not had any fluttering in her chest or any symptoms  -Due to multiple falls, we had stopped her DOAC - placed back on low dose by Dr Jarold Motto at [facility]   OSA -couldn't tolerate CPAP -wouldn't want a sleep study  -Asymptomatic per the patient   History of orthostatic hypotension -She had one episode when she first moved to [facility] but it seems to be more related to hypoglycemia/dehydration than true orthostatic hypotension -resolved -maintain proper hydration -recommended compression hose/socks. Difficult to tolerate due to  patient's neuropathy  Parkinson's/PSP:  - poor prognosis foot drop from back issues Fall risk currently at [facility]   Carotid disease -1-39%  stenosis bilateral on duplex 08/26/21   -No bruit on exam  Hyperlipidemia -Recommend Lipid panel in the next few weeks-can be done at facility -Continue pravastatin    Disposition: Given age and comorbid conditions will see as needed   Signed, Charlton Haws, MD 01/10/2023, 11:00 AM  [facility]

## 2023-01-10 ENCOUNTER — Ambulatory Visit: Payer: PPO | Attending: Cardiovascular Disease | Admitting: Cardiovascular Disease

## 2023-01-10 ENCOUNTER — Encounter: Payer: Self-pay | Admitting: Cardiovascular Disease

## 2023-01-10 VITALS — BP 124/68 | HR 87 | Ht 67.0 in

## 2023-01-10 DIAGNOSIS — I480 Paroxysmal atrial fibrillation: Secondary | ICD-10-CM | POA: Diagnosis not present

## 2023-01-10 DIAGNOSIS — G4733 Obstructive sleep apnea (adult) (pediatric): Secondary | ICD-10-CM

## 2023-01-10 DIAGNOSIS — Z5181 Encounter for therapeutic drug level monitoring: Secondary | ICD-10-CM | POA: Diagnosis not present

## 2023-01-10 DIAGNOSIS — Z7901 Long term (current) use of anticoagulants: Secondary | ICD-10-CM

## 2023-01-10 NOTE — Patient Instructions (Signed)
Medication Instructions:  Your physician recommends that you continue on your current medications as directed. Please refer to the Current Medication list given to you today.  *If you need a refill on your cardiac medications before your next appointment, please call your pharmacy*  Lab Work: If you have labs (blood work) drawn today and your tests are completely normal, you will receive your results only by: MyChart Message (if you have MyChart) OR A paper copy in the mail If you have any lab test that is abnormal or we need to change your treatment, we will call you to review the results.  Testing/Procedures: None ordered today.  Follow-Up: At [facility], you and your health needs are our priority.  As part of our continuing mission to provide you with exceptional heart care, we have created designated Provider Care Teams.  These Care Teams include your primary Cardiologist (physician) and Advanced Practice Providers (APPs -  Physician Assistants and Nurse Practitioners) who all work together to provide you with the care you need, when you need it.  We recommend signing up for the patient portal called "MyChart".  Sign up information is provided on this After Visit Summary.  MyChart is used to connect with patients for Virtual Visits (Telemedicine).  Patients are able to view lab/test results, encounter notes, upcoming appointments, etc.  Non-urgent messages can be sent to your provider as well.   To learn more about what you can do with MyChart, go to [email or web address].    Your next appointment:   1 year(s)  Provider:   Peter Nishan, MD      

## 2023-01-11 DIAGNOSIS — Z79899 Other long term (current) drug therapy: Secondary | ICD-10-CM | POA: Diagnosis not present

## 2023-01-13 DIAGNOSIS — I739 Peripheral vascular disease, unspecified: Secondary | ICD-10-CM | POA: Diagnosis not present

## 2023-01-13 DIAGNOSIS — L603 Nail dystrophy: Secondary | ICD-10-CM | POA: Diagnosis not present

## 2023-01-13 DIAGNOSIS — L602 Onychogryphosis: Secondary | ICD-10-CM | POA: Diagnosis not present

## 2023-01-13 DIAGNOSIS — L84 Corns and callosities: Secondary | ICD-10-CM | POA: Diagnosis not present

## 2023-02-25 DIAGNOSIS — M5416 Radiculopathy, lumbar region: Secondary | ICD-10-CM | POA: Diagnosis not present

## 2023-03-05 DIAGNOSIS — Z79899 Other long term (current) drug therapy: Secondary | ICD-10-CM | POA: Diagnosis not present

## 2023-04-21 DIAGNOSIS — L603 Nail dystrophy: Secondary | ICD-10-CM | POA: Diagnosis not present

## 2023-04-21 DIAGNOSIS — L84 Corns and callosities: Secondary | ICD-10-CM | POA: Diagnosis not present

## 2023-04-21 DIAGNOSIS — L602 Onychogryphosis: Secondary | ICD-10-CM | POA: Diagnosis not present

## 2023-04-21 DIAGNOSIS — I739 Peripheral vascular disease, unspecified: Secondary | ICD-10-CM | POA: Diagnosis not present

## 2023-06-13 NOTE — Progress Notes (Signed)
 Assessment/Plan:   1.  PSP             -Patient understands differences between PSP and Parkinson's disease.  Understands that there is an increased risk of falls, aspiration as subsequent morbidity and mortality from this disease compared to Parkinson's disease.             -Unclear why facility increased the levodopa .  Decrease carbidopa /levodopa  25/100, from 3 po tid to 3/2/2 x 2 weeks and then decrease to 2 at 8am/noon/4pm (still with facial dyskinesia)     2.  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  this is resolved   3.  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  Pt unhappy/frustrated with care as doesn't think that PA is listening to her.               -I believe they took her off of cymbalta  due to restarting the eliquis  and interaction with the two             -currently on gabapentin  600 mg tid             -on ultram  bid (9pm and 5am).  Not getting confused per daughter.  Unclear how often taking.  Not listed in pdmp  4.  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  She does not disagree.  She has had injections but didn't find them that beneficial. She is on gabapentin  and voltaren gel.  She is also on ultram .  Her biggest complaint is really not the foot drop, but the leg pain with possible dystonia.  Discussed botox  and I will see if Dr. Carilyn will be willing to see her.    6.  Diplopia  -following with ophth  Subjective:   [name] was seen today in follow up for PSP.  My previous records were reviewed prior to todays visit as well  as outside records available to me.  Pt with daughter who supplements hx. Other daughter on phone.  She has had no falls.  She walks once per week with PT with the walker.  She has had no hallucinations.  Last visit, the nursing facility had increased her levodopa  to 3 tablets 3 times per day.  We were unclear why this was done, and ultimately decreased back her levodopa  to 2 tablets 3 times per day.  She did fine with the decrease.  She has morning nausea/emesis one time per week and is not sure what medication causes it.  She doesn't have it with other dosages of carbidopa /levodopa .  She saw her cardiologist August 5.  Cardiology noted that nursing facility placed the patient back on Eliquis .  Dr. Nishan decided to continue that, unless the patient started falling again. She c/o foot drop again today.  Asks about pain medication.  She is still seeing pain management.  She is awaiting a call back but doesn't think injections helped in past.  has pressure sore has opened up  on the R heel again and is awaiting a wound consult again.    Current prescribed movement disorder medications: Carbidopa /levodopa  25/100, 2 po 3 times per day   PREVIOUS MEDICATIONS: Rasagiline  (confusion)  ALLERGIES:   Allergies  Allergen Reactions   Lyrica  [Pregabalin ] Other (See Comments)    Felt loopy   Zocor  [Simvastatin ] Other (See Comments)    Myalgias   Relafen [Nabumetone] Rash    CURRENT MEDICATIONS:  Outpatient Encounter Medications as of 06/14/2023  Medication Sig   acetaminophen  (TYLENOL  8 HOUR) 650 MG CR tablet Take 1 tablet (650 mg total) by mouth every 8 (eight) hours as needed for pain.   B Complex-Folic Acid  (SUPER B COMPLEX MAXI) TABS Take by mouth.   carbidopa -levodopa  (SINEMET  IR) 25-100 MG tablet 3 at 8am/2 at noon/2 at 4pm x 2 weeks and then take 2 tablets at 8am/noon/4pm (Patient taking differently: Take 2 tablets by mouth 3 (three) times daily.  2 tablets at 8am/noon/4pm)   Cholecalciferol  50 MCG  (2000 UT) CAPS Take 4,000 Units by mouth daily.   cilostazol  (PLETAL ) 50 MG tablet Take 50 mg by mouth 2 (two) times daily.   DULoxetine  (CYMBALTA ) 60 MG capsule Take 60 mg by mouth 2 (two) times daily with a meal. (Patient not taking: Reported on 01/10/2023)   ELIQUIS  2.5 MG TABS tablet    flecainide  (TAMBOCOR ) 50 MG tablet Take 50 mg by mouth daily.   furosemide  (LASIX ) 20 MG tablet TAKE 1 TABLET(20 MG) BY MOUTH DAILY (Patient taking differently: Take 20 mg by mouth daily.)   gabapentin  (NEURONTIN ) 300 MG capsule Take 300 mg by mouth See admin instructions. 2 capsule by mouth 3 times a day for pain   loratadine  (CLARITIN ) 10 MG tablet Take 10 mg by mouth daily.   Menthol , Topical Analgesic, (BIOFREEZE EX) Apply 1 application  topically daily as needed (pain).   metoprolol  succinate (TOPROL -XL) 25 MG 24 hr tablet Take 12.5 mg by mouth daily.   NON FORMULARY Gaba 750mg  take 1 tab once a day   pantoprazole  (PROTONIX ) 40 MG tablet Take 40 mg by mouth daily.  (Patient not taking: Reported on 01/10/2023)   polyethylene glycol (MIRALAX  / GLYCOLAX ) packet Take 17 g by mouth daily. (Patient taking differently: Take 17 g by mouth daily as needed for mild constipation.)   potassium chloride  SA (K-DUR) 20 MEQ tablet Take 1 tablet (20 mEq total) by mouth daily.   pravastatin  (PRAVACHOL ) 40 MG tablet Take 40 mg by mouth daily with lunch.   TRAMADOL  HCL ER PO Take 25 mg by mouth as needed.   TRAVATAN Z 0.004 % SOLN ophthalmic solution Place 1 drop into both eyes at bedtime.    No facility-administered encounter medications on file as of 06/14/2023.    Objective:   PHYSICAL EXAMINATION:    VITALS:   Vitals:   06/14/23 1400  BP: 126/66  Pulse: 80  SpO2: 98%    GEN:  The patient appears stated age and is in NAD.  She is intermittently tearful, but most of the time she is not.  This is similar to last visit HEENT:  Normocephalic, atraumatic.  The mucous membranes are moist. The superficial temporal  arteries are without ropiness or tenderness. CV:  RRR Lungs:  CTAB Neck/HEME:  There are no carotid bruits bilaterally.  Neurological examination:  Orientation: The patient is alert and oriented x3. Cranial nerves: There is good facial symmetry with facial hypomimia.  She does have some square wave jerks.  Has downgaze  paresis.   The speech is fluent and clear. Soft palate rises symmetrically and there is no tongue deviation. Hearing is intact to conversational tone. Sensation: Sensation is intact to light touch throughout Motor: Strength is at least antigravity x4.  She has left foot drop, but is wearing her brace.  I took it off to examine her.  The L foot is inverted.  Movement examination: Tone: There is normal tone in the upper extremities Abnormal movements: No dyskinesia today (markedly improved from last visit). Coordination:  There is mild decremation with RAM's Gait and Station: Not tested as patient is not walking much except with therapy 1 time per week     I have reviewed and interpreted the following labs independently    Chemistry      Component Value Date/Time   NA 140 08/16/2020 1715   K 4.3 08/16/2020 1715   CL 101 08/16/2020 1715   CO2 31 08/16/2020 1715   BUN 19 08/16/2020 1715   CREATININE 0.98 08/16/2020 1715   CREATININE 0.93 01/11/2019 1547      Component Value Date/Time   CALCIUM  9.5 08/16/2020 1715   ALKPHOS 94 06/07/2020 0359   AST 20 06/07/2020 0359   ALT 10 06/07/2020 0359   BILITOT 0.3 06/07/2020 0359       Lab Results  Component Value Date   WBC 7.8 08/16/2020   HGB 13.5 08/16/2020   HCT 43.7 08/16/2020   MCV 92.4 08/16/2020   PLT 190 08/16/2020    Lab Results  Component Value Date   TSH 1.298 12/25/2019     Total time spent on today's visit was 30 minutes, including both face-to-face time and nonface-to-face time.  Time included that spent on review of records (prior notes available to me/labs/imaging if pertinent), discussing  treatment and goals, answering patient's questions and coordinating care.  Cc:  Yolande Toribio MATSU, MD

## 2023-06-14 ENCOUNTER — Ambulatory Visit: Payer: PPO | Admitting: Neurology

## 2023-06-14 VITALS — BP 126/66 | HR 80

## 2023-06-14 DIAGNOSIS — G231 Progressive supranuclear ophthalmoplegia [Steele-Richardson-Olszewski]: Secondary | ICD-10-CM | POA: Diagnosis not present

## 2023-06-14 DIAGNOSIS — G248 Other dystonia: Secondary | ICD-10-CM

## 2023-06-15 ENCOUNTER — Telehealth: Payer: Self-pay | Admitting: Neurology

## 2023-06-15 NOTE — Telephone Encounter (Signed)
 Let pt/daughter know that Dr. Wynn Banker will kindly see pt for botox eval and she will remain with current pain management for other pain managemnt.

## 2023-06-15 NOTE — Telephone Encounter (Signed)
 Called and gave patient Dr. Iona Beard recommendation on Dr. Jodean Lima office and botox

## 2023-06-22 ENCOUNTER — Encounter: Payer: Self-pay | Admitting: Physical Medicine & Rehabilitation

## 2023-07-04 ENCOUNTER — Telehealth: Payer: Self-pay | Admitting: *Deleted

## 2023-07-04 DIAGNOSIS — Z01818 Encounter for other preprocedural examination: Secondary | ICD-10-CM

## 2023-07-04 NOTE — Telephone Encounter (Signed)
   Pre-operative Risk Assessment    Patient Name: [name]  DOB: [birth year] MRN: [id number]   Date of last office visit: 01/10/2023 Date of next office visit: None   Request for Surgical Clearance    Procedure:   Bilateral S1 Transforaminal ESI  Date of Surgery:  Clearance TBD                                 Surgeon:  Dr. Aileen Fass Surgeon's Group or Practice Name:  [facility] Phone number:  [phone] Fax number:  [phone]   Type of Clearance Requested:   - Medical  - Pharmacy:  Hold Apixaban (Eliquis) 3 days prior, resume day after.   Type of Anesthesia:  Not Indicated   Additional requests/questions:    Signed, Emmit Pomfret   07/04/2023, 3:24 PM

## 2023-07-04 NOTE — Telephone Encounter (Signed)
   Name: [name]  DOB: [birth year]  MRN: [id number]  Primary Cardiologist: Charlton Haws, MD   Preoperative team, please contact this patient and set up a phone call appointment for further preoperative risk assessment. Please obtain consent and complete medication review. Thank you for your help.  Last note from Dr. [name] states she is off anticoagulation. Will send to PharmD for recommendations regarding anticoagulation just in case it was restarted.   I also confirmed the patient resides in the state of [place]. As per [facility] Medical Board telemedicine laws, the patient must reside in the state in which the provider is licensed. Marolyn Haller, [place]   Tereso Newcomer, PA-C 07/04/2023, 5:49 PM [facility]

## 2023-07-05 NOTE — Telephone Encounter (Signed)
Left message to call back to schedule CBC and BMET as well as tele preop appt.   I did not see that our office called earlier today until I hung up.

## 2023-07-05 NOTE — Telephone Encounter (Signed)
Patient with diagnosis of atrial fibrillation on Eliquis for anticoagulation.    Procedure:   Bilateral S1 Transforaminal ESI   Date of Surgery:  Clearance TBD      CHA2DS2-VASc Score = 5   This indicates a 7.2% annual risk of stroke. The patient's score is based upon: CHF History: 0 HTN History: 1 Diabetes History: 0 Stroke History: 0 Vascular Disease History: 1 Age Score: 2 Gender Score: 1   Waiting for labs to complete  **This guidance is not considered finalized until pre-operative APP has relayed final recommendations.**

## 2023-07-05 NOTE — Telephone Encounter (Signed)
Patient doesn't have current labs, please draw BMET and CBC for clearance

## 2023-07-05 NOTE — Telephone Encounter (Signed)
1st attempt to reach pt regarding surgical clearance and the need for a TELE appointment.  Left pt a detailed message to call back and get that scheduled.

## 2023-07-06 ENCOUNTER — Telehealth: Payer: Self-pay | Admitting: *Deleted

## 2023-07-06 NOTE — Telephone Encounter (Signed)
DPR pt's daughter Cala Bradford called back and has set up labs to be done 07/13/23 and tele preop appt 07/15/23. Cala Bradford is at the airport currently to fly out to [facility] and will not be back until next week.   Pt resides in [facility] per [facility]. Consent is done. I will see if we can get meds confirmed with [facility].   Labs have been placed and released for [employer]

## 2023-07-06 NOTE — Addendum Note (Signed)
Addended by: Tarri Fuller on: 07/06/2023 10:32 AM   Modules accepted: Orders

## 2023-07-06 NOTE — Telephone Encounter (Signed)
I received med list from [facility] and have updated med list.

## 2023-07-13 LAB — CBC

## 2023-07-14 LAB — BASIC METABOLIC PANEL
BUN/Creatinine Ratio: 21 (ref 12–28)
BUN: 19 mg/dL (ref 8–27)
CO2: 24 mmol/L (ref 20–29)
Calcium: 9.7 mg/dL (ref 8.7–10.3)
Chloride: 106 mmol/L (ref 96–106)
Creatinine, Ser: 0.9 mg/dL (ref 0.57–1.00)
Glucose: 87 mg/dL (ref 70–99)
Potassium: 5.2 mmol/L (ref 3.5–5.2)
Sodium: 145 mmol/L — ABNORMAL HIGH (ref 134–144)
eGFR: 65 mL/min/{1.73_m2} (ref >59)

## 2023-07-14 LAB — CBC
Hematocrit: 39.3 % (ref 34.0–46.6)
Hemoglobin: 12.7 g/dL (ref 11.1–15.9)
MCH: 28.3 pg (ref 26.6–33.0)
MCHC: 32.3 g/dL (ref 31.5–35.7)
MCV: 88 fL (ref 79–97)
Platelets: 186 10*3/uL (ref 150–450)
RBC: 4.48 x10E6/uL (ref 3.77–5.28)
RDW: 13.7 % (ref 11.7–15.4)
WBC: 4.2 10*3/uL (ref 3.4–10.8)

## 2023-07-14 NOTE — Telephone Encounter (Signed)
 Patient with diagnosis of atrial fibrillation on Eliquis  for anticoagulation.     Procedure:   Bilateral S1 Transforaminal ESI   Date of Surgery:  Clearance TBD        CHA2DS2-VASc Score = 5   This indicates a 7.2% annual risk of stroke. The patient's score is based upon: CHF History: 0 HTN History: 1 Diabetes History: 0 Stroke History: 0 Vascular Disease History: 1 Age Score: 2 Gender Score: 1   CrCl 59 ml/min Plt: 186  Patient may hold Eliquis  for 3 days prior to procedure.   **This guidance is not considered finalized until pre-operative APP has relayed final recommendations.**

## 2023-07-15 ENCOUNTER — Ambulatory Visit: Payer: PPO | Attending: Cardiology

## 2023-07-15 DIAGNOSIS — Z01810 Encounter for preprocedural cardiovascular examination: Secondary | ICD-10-CM

## 2023-07-15 NOTE — Telephone Encounter (Signed)
   Name: [name]  DOB: [birth year]  MRN: [id number]  Primary Cardiologist: Maude Emmer, MD   Preoperative team, please contact this patient and set up a phone call appointment for further preoperative risk assessment. Please obtain consent and complete medication review. Thank you for your help.  I confirm that guidance regarding antiplatelet and oral anticoagulation therapy has been completed and, if necessary, noted below.  Patient may hold Eliquis  for 3 days prior to procedure.   I also confirmed the patient resides in the state of [place] . As per [facility] Medical Board telemedicine laws, the patient must reside in the state in which the provider is licensed.   [name], NP 07/15/2023, 8:02 AM [facility]

## 2023-07-15 NOTE — Progress Notes (Signed)
 Virtual Visit via Telephone Note   Because of [name]'s co-morbid illnesses, she is at least at moderate risk for complications without adequate follow up.  This format is felt to be most appropriate for this patient at this time.  The patient did not have access to video technology/had technical difficulties with video requiring transitioning to audio format only (telephone).  All issues noted in this document were discussed and addressed.  No physical exam could be performed with this format.  Please refer to the patient's chart for her consent to telehealth for [name].  Evaluation Performed:  Preoperative cardiovascular risk assessment _____________   Date:  07/15/2023   Patient ID:  [name], DOB 05/22/1941, MRN [id number] Patient Location:  Home Provider location:   Office  Primary Care Provider:  Yolande [name] MATSU, [name] Primary Cardiologist:  [name]  Chief Complaint / Patient Profile  83 y.o. y/o female with a h/o hypertension, OSA with intolerance to CPAP, hyperlipidemia, Parkinson's and PAF who is pending bilateral S1 transforaminal ESI with [name] and presents today for telephonic preoperative cardiovascular risk assessment. History of Present Illness   [name] is a 83 y.o. female who presents via audio/video conferencing for a telehealth visit today.  Pt was last seen in cardiology clinic on 01/10/2023 by Dr. Emmer.  At that time [name] was doing well.  The patient is now pending procedure as outlined above. Since her last visit, she has remained stable from a cardiac standpoint.  Patient and daughter note chronic mild lower extremity edema, patient sits in dependent position in a wheelchair throughout the day.  Unfortunately she is unable to tolerate compression stockings due to neuropathy.  She denies shortness of breath, orthopnea [name] PND. Today she denies chest pain, fatigue, palpitations, melena,  hematuria, hemoptysis, diaphoresis, weakness, presyncope, syncope. 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   GERD (gastroesophageal reflux disease)    Hyperlipidemia    Hypertension    Lumbar stenosis    OSA (obstructive sleep apnea)    couldn't handle CPAP; use mouth guard some; not all the time (01/06/2014)   PAF (paroxysmal atrial fibrillation) (HCC)    Scoliosis    with radiculopathy L2-S1 with prior surgery   Small bowel obstruction (HCC)    versus ileus after last bck surgery   Spondylosis    Past Surgical History:  Procedure Laterality Date   BACK SURGERY     CATARACT EXTRACTION W/ INTRAOCULAR LENS  IMPLANT, BILATERAL  2011   CHOLECYSTECTOMY N/A 07/07/2018   Procedure: LAPAROSCOPIC CHOLECYSTECTOMY;  Surgeon: [name];  Location: [name];  Service: General;  Laterality: N/A;   COLONOSCOPY  12/09/2004   LIPOMA EXCISION  1980's   fatty tumors   LUMBAR DISC SURGERY  02/2009   ruptured disc   NASAL SEPTUM SURGERY  80's   POSTERIOR LUMBAR FUSION  06/2010; 10/2011   placed screws, rods, spacers both times   POSTERIOR LUMBAR FUSION 4 LEVEL N/A 12/13/2018   Procedure: THORACIC NINE -LUMBAR THREE POSTERIOR INSTRUMENTATION FUSION;  Surgeon: [name];  Location: [name];  Service: Neurosurgery;  Laterality: N/A;   REPAIR DURAL / CSF LEAK  02/2009   THORACIC  LAMINECTOMY FOR EPIDURAL ABSCESS N/A 09/28/2018   Procedure: THORACIC NINE-THORACIC TEN, THORACIC TEN-THORACIC ELEVEN, THORACIC ELEVEN-THORACIC TWELVE LAMINECTOMIES FOR EPIDURAL ABSCESS;  Surgeon: [name];  Location: [name];  Service: Neurosurgery;  Laterality: N/A;   TOTAL ABDOMINAL HYSTERECTOMY  02/1993   Allergies Allergies  Allergen Reactions   Lyrica  [Pregabalin ] Other (See Comments)    Felt  loopy   Zocor  [Simvastatin ] Other (See Comments)    Myalgias   Relafen [Nabumetone] Rash   Home Medications    Prior to Admission medications   Medication Sig Start Date End Date Taking? Authorizing Provider  acetaminophen  (TYLENOL  8 HOUR) 650 MG CR tablet Take 1 tablet (650 mg total) by mouth every 8 (eight) hours as needed for pain. 12/17/19   [name]  B Complex-Folic Acid  (SUPER B COMPLEX MAXI) TABS Take by mouth.    Provider, Historical, [name]  carbidopa -levodopa  (SINEMET  IR) 25-100 MG tablet 3 at 8am/2 at noon/2 at 4pm x 2 weeks and then take 2 tablets at 8am/noon/4pm Patient taking differently: Take 2 tablets by mouth 3 (three) times daily.  2 tablets at 8am/noon/4pm 11/22/22   [name]  Cholecalciferol  50 MCG (2000 UT) CAPS Take 4,000 Units by mouth daily.    Provider, Historical, [name]  cilostazol  (PLETAL ) 50 MG tablet Take 50 mg by mouth 2 (two) times daily.    Provider, Historical, [name]  DULoxetine  (CYMBALTA ) 60 MG capsule Take 60 mg by mouth 2 (two) times daily with a meal. Patient not taking: Reported on 01/10/2023 10/25/19   Provider, Historical, [name]  ELIQUIS  2.5 MG TABS tablet Take 2.5 mg by mouth 2 (two) times daily. 02/27/22   Provider, Historical, [name]  flecainide  (TAMBOCOR ) 50 MG tablet Take 50 mg by mouth daily.    Provider, Historical, [name]  furosemide  (LASIX ) 20 MG tablet TAKE 1 TABLET(20 MG) BY MOUTH DAILY Patient taking differently: Take 20 mg by mouth daily. 04/28/20   [name]  gabapentin  (NEURONTIN ) 300 MG capsule Take 300 mg by mouth See admin instructions. 2 capsule by mouth 3 times a day for pain    Provider, Historical, [name]  Lidocaine  HCl (ASPERCREME LIDOCAINE ) 4 % CREA Apply topically. PRN AS DIRECTED    Provider, Historical, [name]  loratadine  (CLARITIN ) 10 MG tablet Take 10 mg by mouth daily.    Provider, Historical, [name]  Menthol , Topical Analgesic, (BIOFREEZE EX) Apply 1 application  topically daily as needed (pain). Patient not taking:  Reported on 07/06/2023    Provider, Historical, [name]  metoprolol  succinate (TOPROL -XL) 25 MG 24 hr tablet Take 12.5 mg by mouth daily.    Provider, Historical, [name]  NON FORMULARY Gaba 750mg  take 1 tab once a day Patient not taking: Reported on 07/06/2023    Provider, Historical, [name]  pantoprazole  (PROTONIX ) 40 MG tablet Take 40 mg by mouth daily.  Patient not taking: Reported on 01/10/2023    Provider, Historical, [name]  polyethylene glycol (MIRALAX  / GLYCOLAX ) packet Take 17 g by mouth daily. Patient not taking: Reported on 07/06/2023 07/10/18   Sherrill Cable Latif, [name]  potassium chloride  SA (K-DUR) 20 MEQ tablet Take 1 tablet (20 mEq total) by mouth daily. Patient taking differently: Take 20 mEq by mouth 2 (two) times daily. 01/01/19   [name]  pravastatin  (PRAVACHOL ) 40 MG tablet Take 40 mg by mouth daily with lunch.    Provider, Historical, [name]  TRAMADOL  HCL ER PO Take 50 mg by mouth 4 (four) times daily as needed (prn  for pain management).    Provider, Historical, [name]  TRAVATAN Z 0.004 % SOLN ophthalmic solution Place 1 drop into both eyes at bedtime.  01/21/15   Provider, Historical, [name]   Physical Exam    Vital Signs:  [name] does not have vital signs available for review today. Given telephonic nature of communication, physical exam is limited. AAOx3. NAD. Normal affect.  Speech and respirations are unlabored. Accessory Clinical Findings   None Assessment & Plan    1.  Preoperative Cardiovascular Risk Assessment: Bilateral S1 transforaminal ESI with Dr. Deatrice Manus  Ms. Chopra's perioperative risk of a major cardiac event is 0.4% according to the Revised Cardiac Risk Index (RCRI).  Therefore, she is at low risk for perioperative complications. Patient is unable to achieve greater than 4 METS of activity.  Discussed with Dr. Delford, patients primary cardiologist, he notes she is at acceptable risk for procedure.  She does not require any further cardiovascular  testing. Antiplatelet and/[name] Anticoagulation Recommendations: Eliquis  (Apixaban ) can be held for 3 days prior to surgery.  Please resume post op when felt to be safe.     The patient was advised that if she develops new symptoms prior to surgery to contact our office to arrange for a follow-up visit, and she verbalized understanding.  A copy of this note will be routed to requesting surgeon.  Time:   Today, I have spent 15 minutes with the patient with telehealth technology discussing medical history, symptoms, and management plan.    Ramiah Helfrich D Aviv Lengacher, NP  07/15/2023, 11:33 AM

## 2023-07-18 NOTE — Telephone Encounter (Signed)
 Left message to call back to schedule tele pre op appt.

## 2023-07-26 ENCOUNTER — Encounter: Payer: PPO | Attending: Physical Medicine & Rehabilitation | Admitting: Physical Medicine & Rehabilitation

## 2023-07-26 ENCOUNTER — Encounter: Payer: Self-pay | Admitting: Physical Medicine & Rehabilitation

## 2023-07-26 VITALS — BP 121/71 | HR 82 | Ht 67.0 in | Wt 191.0 lb

## 2023-07-26 DIAGNOSIS — M25572 Pain in left ankle and joints of left foot: Secondary | ICD-10-CM | POA: Diagnosis present

## 2023-07-26 DIAGNOSIS — G248 Other dystonia: Secondary | ICD-10-CM | POA: Insufficient documentation

## 2023-07-26 NOTE — Patient Instructions (Signed)
 OnabotulinumtoxinA Injection (Medical Use) What is this medication? ONABOTULINUMTOXINA (o na BOTT you lye num tox in eh) treats severe muscle spasms. It may also be used to prevent migraine headaches. It can treat excessive sweating when other medications do not work well enough. This medicine may be used for other purposes; ask your health care provider or pharmacist if you have questions. COMMON BRAND NAME(S): Botox What should I tell my care team before I take this medication? They need to know if you have any of these conditions: Breathing problems Cerebral palsy spasms Difficulty urinating Heart problems History of surgery where this medication is going to be used Infection at the site where this medication is going to be used Myasthenia gravis or other neurologic disease Nerve or muscle disease Surgery plans Take medications that treat or prevent blood clots Thyroid problems An unusual or allergic reaction to botulinum toxin, albumin, other medications, foods, dyes, or preservatives Pregnant or trying to get pregnant Breast-feeding How should I use this medication? This medication is for injected into a muscle. It is given by your care team in a hospital or clinic setting. A special MedGuide will be given to you before each treatment. Be sure to read this information carefully each time. Talk to your care team about the use of this medication in children. While this medication may be prescribed for children as young as 2 years for selected conditions, precautions do apply. Overdosage: If you think you have taken too much of this medicine contact a poison control center or emergency room at once. NOTE: This medicine is only for you. Do not share this medicine with others. What if I miss a dose? This does not apply. What may interact with this medication? Aminoglycoside antibiotics, such as gentamicin, neomycin, tobramycin Muscle relaxants Other botulinum toxin injections This  list may not describe all possible interactions. Give your health care provider a list of all the medicines, herbs, non-prescription drugs, or dietary supplements you use. Also tell them if you smoke, drink alcohol, or use illegal drugs. Some items may interact with your medicine. What should I watch for while using this medication? Visit your care team for regular check ups. This medication will cause weakness in the muscle where it is injected. Tell your care team if you feel unusually weak in other muscles. Get medical help right away if you have problems with breathing, swallowing, or talking. This medication might make your eyelids droop or make you see blurry or double. If you have weak muscles or trouble seeing do not drive a car, use machinery, or do other dangerous activities. This medication contains albumin from human blood. It may be possible to pass an infection in this medication, but no cases have been reported. Talk to your care team about the risks and benefits of this medication. If your activities have been limited by your condition, go back to your regular routine slowly after treatment with this medication. What side effects may I notice from receiving this medication? Side effects that you should report to your care team as soon as possible: Allergic reactions--skin rash, itching, hives, swelling of the face, lips, tongue, or throat Dryness or irritation of the eyes, eye pain, change in vision, sensitivity to light Infection--fever, chills, cough, sore throat, wounds that don't heal, pain or trouble when passing urine, general feeling of discomfort or being unwell Spread of botulinum toxin effects--unusual weakness or fatigue, blurry or double vision, trouble swallowing, hoarseness or trouble speaking, trouble breathing, loss of bladder  control Trouble passing urine Side effects that usually do not require medical attention (report these to your care team if they continue or are  bothersome): Dry mouth Eyelid drooping Fatigue Headache Pain, redness, or irritation at injection site This list may not describe all possible side effects. Call your doctor for medical advice about side effects. You may report side effects to FDA at 1-800-FDA-1088. Where should I keep my medication? This medication is given in a hospital or clinic and will not be stored at home. NOTE: This sheet is a summary. It may not cover all possible information. If you have questions about this medicine, talk to your doctor, pharmacist, or health care provider.  2024 Elsevier/Gold Standard (2021-05-21 00:00:00)

## 2023-07-26 NOTE — Progress Notes (Unsigned)
Subjective:    Patient ID: [name], female    DOB: 09-Nov-1940, 83 y.o.   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  The patient is severely disabled from her PSP she resides in a skilled nursing facility and only ambulates with physical therapy staff about twice a week.  She is mainly in the wheelchair.  She requires assistance for self-care including dressing and bathing.  She is accompanied by her daughter today.  Over the last 18 months the patient has been evaluated by vascular surgery, neurology, pain management and podiatry for her complaints.  EMG/NCV testing demonstrated multilevel radiculopathy but not peripheral neuropathy.  Ankle-brachial index disease were normal bilaterally.  She has had fluoroscopy guided lumbar spine injections performed by pain management which according to patient and daughter have not been particularly helpful.  She has been on medication management including gabapentin and tramadol which had modest benefit. Left foot turn inward and is painful Occurs consistently   Has peripheral neuropathy is on Gabapentin for this   Lives in [facility] nursing home  Pt gets PT ~2 x per wk  Left AFO for drop foot   Pain Inventory Average Pain 10 Pain Right Now 0 My pain is intermittent, sharp, burning, dull, stabbing, tingling, and aching  In the last 24 hours, has pain interfered with the following? General activity 10 Relation with others 10 Enjoyment of life 10 What TIME of day is your pain at its worst? morning , daytime, evening, and night Sleep (in general) Fair  Pain is worse with: unsure Pain improves with: medication Relief from Meds: 3  walk with assistance how many minutes can you walk? Not long ability to climb steps?  no do you drive?  no use a wheelchair needs help with transfers  retired I need assistance with the following:  dressing,  bathing, toileting, meal prep, household duties, and shopping  bladder control problems weakness tingling trouble walking  New pt  New pt    Family History  Problem Relation Age of Onset   Arthritis Mother    Heart attack Father    Hypertension Sister    Lung cancer Sister    Asthma Sister    Allergies Sister    Social History   Socioeconomic History   Marital status: Married    Spouse name: [name]   Number of children: 2   Years of education: 12th    Highest education level: Not on file  Occupational History   Occupation: retired    Associate Professor: RETIRED    Comment: clerical office work  Tobacco Use   Smoking status: Never   Smokeless tobacco: Never  Vaping Use   Vaping status: Never Used  Substance and Sexual Activity   Alcohol use: No    Alcohol/week: 0.0 standard drinks of alcohol   Drug use: No   Sexual activity: Never    Comment: hysterectomy  Other Topics Concern   Not on file  Social History Narrative   Patient lives at home with husband [name].   Patient is retired.   Patient has a high school education.    Patient has 2 children .   RIGHT HANDED   Social Drivers of Health   Financial Resource Strain: Not on file  Food Insecurity: Not on file  Transportation Needs: Not on file  Physical Activity: Not on file  Stress: Not on file  Social Connections: Unknown (10/16/2021)  Received from [facility], [facility]   Social Network    Social Network: Not on file   Past Surgical History:  Procedure Laterality Date   BACK SURGERY     CATARACT EXTRACTION W/ INTRAOCULAR LENS  IMPLANT, BILATERAL  2011   CHOLECYSTECTOMY N/A 07/07/2018   Procedure: LAPAROSCOPIC CHOLECYSTECTOMY;  Surgeon: Harriette Bouillon, MD;  Location: [facility] OR;  Service: General;  Laterality: N/A;   COLONOSCOPY  12/09/2004   LIPOMA EXCISION  1980's   "fatty tumors"   LUMBAR DISC SURGERY  02/2009   "ruptured disc"   NASAL SEPTUM SURGERY  80's   POSTERIOR LUMBAR FUSION  06/2010; 10/2011    "placed screws, rods, spacers both times"   POSTERIOR LUMBAR FUSION 4 LEVEL N/A 12/13/2018   Procedure: THORACIC NINE -LUMBAR THREE POSTERIOR INSTRUMENTATION FUSION;  Surgeon: Jadene Pierini, MD;  Location: [facility] OR;  Service: Neurosurgery;  Laterality: N/A;   REPAIR DURAL / CSF LEAK  02/2009   THORACIC LAMINECTOMY FOR EPIDURAL ABSCESS N/A 09/28/2018   Procedure: THORACIC NINE-THORACIC TEN, THORACIC TEN-THORACIC ELEVEN, THORACIC ELEVEN-THORACIC TWELVE LAMINECTOMIES FOR EPIDURAL ABSCESS;  Surgeon: Jadene Pierini, MD;  Location: [facility] OR;  Service: Neurosurgery;  Laterality: N/A;   TOTAL ABDOMINAL HYSTERECTOMY  02/1993   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   GERD (gastroesophageal reflux disease)    Hyperlipidemia    Hypertension    Lumbar stenosis    OSA (obstructive sleep apnea)    "couldn't handle CPAP; use mouth guard some; not all the time" (01/06/2014)   PAF (paroxysmal atrial fibrillation) (HCC)    Scoliosis    with radiculopathy L2-S1 with prior surgery   Small bowel obstruction (HCC)    versus ileus after last bck surgery   Spondylosis    BP 121/71   Pulse 82   Ht 5\' 7"  (1.702 m)   Wt 191 lb (86.6 kg)   SpO2 96%   BMI 29.91 kg/m   Opioid Risk Score:   Fall Risk Score:  `1  Depression screen [facility] 2/9     07/31/2019   10:43 AM 01/11/2019    2:38 PM  Depression screen PHQ 2/9  Decreased Interest 0 0  Down, Depressed, Hopeless 0 0  PHQ - 2 Score 0 0    Review of Systems  Gastrointestinal:  Positive for diarrhea.  Musculoskeletal:        Bilateral leg pain  Neurological:  Positive for weakness and numbness.  All other systems reviewed and are negative.     Objective:   Physical Exam Vitals and nursing note reviewed.  Constitutional:      Appearance: She is obese.  HENT:     Head:  Normocephalic and atraumatic.  Eyes:     Extraocular Movements: Extraocular movements intact.     Conjunctiva/sclera: Conjunctivae normal.     Pupils: Pupils are equal, round, and reactive to light.  Musculoskeletal:     Comments: No evidence of left knee or ankle effusion. Left ankle has no tenderness to palpation along the joint line no tenderness along the Achilles. There is no evidence of pedal edema.  Foot is warm No pain with foot or ankle range of motion  Neurological:     Mental Status: She is alert.     Cranial Nerves:  Dysarthria present. No facial asymmetry.     Sensory: Sensation is intact.     Motor: Abnormal muscle tone present.     Gait: Gait abnormal.     Comments: Sit to stand with min to mod assist.  Cannot stand independently.  Decreased weightbearing on the left side but not painful  Motor strength is 5/5 bilateral deltoid bicep tricep grip 4/5 bilateral hip flexors knee extensors right ankle dorsiflexor and plantar flexor 2 - left ankle dorsiflexor and 3 - ankle plantar flexor There is no evidence of clonus at the ankle Resting position of the left foot and ankle are with moderate supination, heel cord is tight  Psychiatric:        Attention and Perception: Attention normal.        Mood and Affect: Affect is blunt.        Speech: Speech is delayed.        Behavior: Behavior is slowed.           Assessment & Plan:  1.  Chronic left foot and ankle pain.  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  Certainly given her age, degenerative joint disease of the foot and ankle are also in the differential diagnosis and may coexist Goals of treatment would be to reduce pain as well as improve plantigrade positioning of the foot to assist with transfers and even with her limited ambulation. We discussed the usual onset of action and duration of response to Botox as well as the need to adjust dosing as well as muscle group selection.  Initial injection  recommendations Botox 200U  PT 75 FDL 75 FHL 50  We also discussed that she will likely need some orthotic adjustment but would hold off on that until effects of botulinum toxin injections can be further assessed.  This may include a semisolid polypropylene AFO versus double metal upright My recommendations were discussed with the patient as well as her daughter.

## 2023-08-08 ENCOUNTER — Ambulatory Visit
Admission: RE | Admit: 2023-08-08 | Discharge: 2023-08-08 | Disposition: A | Discharge status: Home / Self Care | Source: Ambulatory Visit | Attending: Physical Medicine & Rehabilitation | Admitting: Physical Medicine & Rehabilitation

## 2023-08-08 DIAGNOSIS — M25572 Pain in left ankle and joints of left foot: Secondary | ICD-10-CM

## 2023-08-23 ENCOUNTER — Encounter: Payer: Self-pay | Admitting: Physical Medicine & Rehabilitation

## 2023-08-23 ENCOUNTER — Encounter: Payer: PPO | Attending: Physical Medicine & Rehabilitation | Admitting: Physical Medicine & Rehabilitation

## 2023-08-23 VITALS — BP 104/66 | HR 82 | Ht 67.0 in

## 2023-08-23 DIAGNOSIS — G248 Other dystonia: Secondary | ICD-10-CM | POA: Insufficient documentation

## 2023-08-23 MED ORDER — ONABOTULINUMTOXINA 100 UNITS IJ SOLR
200.0000 [IU] | Freq: Once | INTRAMUSCULAR | Status: AC
  Administered 2023-08-23: 200 [IU] via INTRAMUSCULAR

## 2023-08-23 NOTE — Progress Notes (Signed)
 Botox Injection for spasticity using needle EMG guidance  Dilution: 50 Units/ml Indication: Severe spasticity which interferes with ADL,mobility and/or  hygiene and is unresponsive to medication management and other conservative care Informed consent was obtained after describing risks and benefits of the procedure with the patient. This includes bleeding, bruising, infection, excessive weakness, or medication side effects. A REMS form is on file and signed. Needle: 25g 2" needle electrode Number of units per muscle Left posterior tibialis 50 units Left flexor digitorum longus 50 units Left flexor hallucis longus 50 units  EMG activities 0 No insertional activity Difficult to stimulate, obtained toe flexion twitch mainly  All injections were done after obtaining appropriate EMG activity and after negative drawback for blood. The patient tolerated the procedure well. Post procedure instructions were given. A followup appointment was made.   Is discussed with daughter if no significant clinical improvement at 6-week follow-up would not recommend repeat

## 2023-08-23 NOTE — Patient Instructions (Signed)

## 2023-09-02 ENCOUNTER — Telehealth: Payer: Self-pay | Admitting: Neurology

## 2023-09-02 NOTE — Telephone Encounter (Signed)
 Talked with patients daughter and she is waiting on the urine culture to come back and I asked her to call the doctor that did the botox in her legs if she is having trouble with her legs now. The facility she lives at is the main factor in daughter calling they told daughter that patients PSP is getting worse and they want her appointment moved up. Patient not eating and sleeping most of the time

## 2023-09-02 NOTE — Telephone Encounter (Signed)
 Pt's daughter called in stating the pt had her botox in her legs about 2 weeks ago. The pt states her legs feel heavy. The daughter says the pt has gone downhill and is not eating and could not transfer from her bed to her wheelchair today. They have tested her for a UTI, but the results are not back and it has been 3 days now. She is very worried.

## 2023-09-02 NOTE — Telephone Encounter (Signed)
 Pt's daughter Selena Batten called back in and left a message. She stated she was talking to [facility] earlier and would like to speak with her again.

## 2023-09-05 ENCOUNTER — Telehealth: Payer: Self-pay | Admitting: Physical Medicine & Rehabilitation

## 2023-09-05 NOTE — Telephone Encounter (Signed)
 The family has been advised. They will check to see if its a UTI.

## 2023-09-05 NOTE — Telephone Encounter (Signed)
 Patient has been sick running a fever started Wed or Thurs of last week.  The facility that she is in did UTI analysis, but it came back negative.  Wanted to know if it may be coming from Botox injection.

## 2023-09-06 ENCOUNTER — Other Ambulatory Visit: Payer: Self-pay

## 2023-09-06 ENCOUNTER — Encounter (HOSPITAL_COMMUNITY): Payer: Self-pay | Admitting: Emergency Medicine

## 2023-09-06 ENCOUNTER — Emergency Department (HOSPITAL_COMMUNITY)

## 2023-09-06 ENCOUNTER — Inpatient Hospital Stay (HOSPITAL_COMMUNITY)
Admission: EM | Admit: 2023-09-06 | Discharge: 2023-09-09 | DRG: 690 | Disposition: A | Discharge status: Skilled Nursing Facility | Attending: Internal Medicine | Admitting: Internal Medicine

## 2023-09-06 DIAGNOSIS — N179 Acute kidney failure, unspecified: Principal | ICD-10-CM | POA: Diagnosis present

## 2023-09-06 DIAGNOSIS — K5900 Constipation, unspecified: Secondary | ICD-10-CM | POA: Diagnosis present

## 2023-09-06 DIAGNOSIS — G20C Parkinsonism, unspecified: Secondary | ICD-10-CM | POA: Diagnosis present

## 2023-09-06 DIAGNOSIS — G629 Polyneuropathy, unspecified: Secondary | ICD-10-CM | POA: Diagnosis present

## 2023-09-06 DIAGNOSIS — R5381 Other malaise: Secondary | ICD-10-CM | POA: Diagnosis present

## 2023-09-06 DIAGNOSIS — N136 Pyonephrosis: Principal | ICD-10-CM | POA: Diagnosis present

## 2023-09-06 DIAGNOSIS — Z888 Allergy status to other drugs, medicaments and biological substances status: Secondary | ICD-10-CM

## 2023-09-06 DIAGNOSIS — R052 Subacute cough: Secondary | ICD-10-CM

## 2023-09-06 DIAGNOSIS — Z7902 Long term (current) use of antithrombotics/antiplatelets: Secondary | ICD-10-CM

## 2023-09-06 DIAGNOSIS — N1330 Unspecified hydronephrosis: Secondary | ICD-10-CM | POA: Diagnosis not present

## 2023-09-06 DIAGNOSIS — K7689 Other specified diseases of liver: Secondary | ICD-10-CM | POA: Diagnosis present

## 2023-09-06 DIAGNOSIS — Z66 Do not resuscitate: Secondary | ICD-10-CM | POA: Diagnosis present

## 2023-09-06 DIAGNOSIS — R1310 Dysphagia, unspecified: Secondary | ICD-10-CM

## 2023-09-06 DIAGNOSIS — Z7901 Long term (current) use of anticoagulants: Secondary | ICD-10-CM

## 2023-09-06 DIAGNOSIS — Z8249 Family history of ischemic heart disease and other diseases of the circulatory system: Secondary | ICD-10-CM

## 2023-09-06 DIAGNOSIS — I509 Heart failure, unspecified: Secondary | ICD-10-CM | POA: Diagnosis present

## 2023-09-06 DIAGNOSIS — I130 Hypertensive heart and chronic kidney disease with heart failure and stage 1 through stage 4 chronic kidney disease, or unspecified chronic kidney disease: Secondary | ICD-10-CM | POA: Diagnosis present

## 2023-09-06 DIAGNOSIS — Z825 Family history of asthma and other chronic lower respiratory diseases: Secondary | ICD-10-CM

## 2023-09-06 DIAGNOSIS — K769 Liver disease, unspecified: Secondary | ICD-10-CM

## 2023-09-06 DIAGNOSIS — Z8261 Family history of arthritis: Secondary | ICD-10-CM

## 2023-09-06 DIAGNOSIS — G231 Progressive supranuclear ophthalmoplegia [Steele-Richardson-Olszewski]: Secondary | ICD-10-CM | POA: Diagnosis present

## 2023-09-06 DIAGNOSIS — E860 Dehydration: Secondary | ICD-10-CM | POA: Diagnosis present

## 2023-09-06 DIAGNOSIS — Z1152 Encounter for screening for COVID-19: Secondary | ICD-10-CM

## 2023-09-06 DIAGNOSIS — G20A1 Parkinson's disease without dyskinesia, without mention of fluctuations: Secondary | ICD-10-CM | POA: Diagnosis present

## 2023-09-06 DIAGNOSIS — N1831 Chronic kidney disease, stage 3a: Secondary | ICD-10-CM | POA: Diagnosis present

## 2023-09-06 DIAGNOSIS — E785 Hyperlipidemia, unspecified: Secondary | ICD-10-CM | POA: Diagnosis present

## 2023-09-06 DIAGNOSIS — Z79899 Other long term (current) drug therapy: Secondary | ICD-10-CM

## 2023-09-06 DIAGNOSIS — I480 Paroxysmal atrial fibrillation: Secondary | ICD-10-CM | POA: Diagnosis present

## 2023-09-06 DIAGNOSIS — I4891 Unspecified atrial fibrillation: Secondary | ICD-10-CM | POA: Diagnosis present

## 2023-09-06 DIAGNOSIS — Z90710 Acquired absence of both cervix and uterus: Secondary | ICD-10-CM

## 2023-09-06 DIAGNOSIS — R531 Weakness: Secondary | ICD-10-CM

## 2023-09-06 DIAGNOSIS — Z801 Family history of malignant neoplasm of trachea, bronchus and lung: Secondary | ICD-10-CM

## 2023-09-06 DIAGNOSIS — D649 Anemia, unspecified: Secondary | ICD-10-CM | POA: Diagnosis present

## 2023-09-06 DIAGNOSIS — K219 Gastro-esophageal reflux disease without esophagitis: Secondary | ICD-10-CM | POA: Diagnosis present

## 2023-09-06 LAB — CBC WITH DIFFERENTIAL/PLATELET
Abs Immature Granulocytes: 0.06 10*3/uL (ref 0.00–0.07)
Basophils Absolute: 0 10*3/uL (ref 0.0–0.1)
Basophils Relative: 0 %
Eosinophils Absolute: 0.1 10*3/uL (ref 0.0–0.5)
Eosinophils Relative: 2 %
HCT: 33.7 % — ABNORMAL LOW (ref 36.0–46.0)
Hemoglobin: 10.6 g/dL — ABNORMAL LOW (ref 12.0–15.0)
Immature Granulocytes: 1 %
Lymphocytes Relative: 9 %
Lymphs Abs: 0.7 10*3/uL (ref 0.7–4.0)
MCH: 28 pg (ref 26.0–34.0)
MCHC: 31.5 g/dL (ref 30.0–36.0)
MCV: 89.2 fL (ref 80.0–100.0)
Monocytes Absolute: 0.9 10*3/uL (ref 0.1–1.0)
Monocytes Relative: 11 %
Neutro Abs: 6.1 10*3/uL (ref 1.7–7.7)
Neutrophils Relative %: 77 %
Platelets: 234 10*3/uL (ref 150–400)
RBC: 3.78 MIL/uL — ABNORMAL LOW (ref 3.87–5.11)
RDW: 14.9 % (ref 11.5–15.5)
WBC: 7.9 10*3/uL (ref 4.0–10.5)
nRBC: 0 % (ref 0.0–0.2)

## 2023-09-06 LAB — COMPREHENSIVE METABOLIC PANEL WITH GFR
ALT: 10 U/L (ref 0–44)
AST: 60 U/L — ABNORMAL HIGH (ref 15–41)
Albumin: 2.5 g/dL — ABNORMAL LOW (ref 3.5–5.0)
Alkaline Phosphatase: 97 U/L (ref 38–126)
Anion gap: 10 (ref 5–15)
BUN: 19 mg/dL (ref 8–23)
CO2: 23 mmol/L (ref 22–32)
Calcium: 8.5 mg/dL — ABNORMAL LOW (ref 8.9–10.3)
Chloride: 104 mmol/L (ref 98–111)
Creatinine, Ser: 1.21 mg/dL — ABNORMAL HIGH (ref 0.44–1.00)
GFR, Estimated: 45 mL/min — ABNORMAL LOW (ref >60)
Glucose, Bld: 117 mg/dL — ABNORMAL HIGH (ref 70–99)
Potassium: 4.4 mmol/L (ref 3.5–5.1)
Sodium: 137 mmol/L (ref 135–145)
Total Bilirubin: 0.6 mg/dL (ref 0.0–1.2)
Total Protein: 5.9 g/dL — ABNORMAL LOW (ref 6.5–8.1)

## 2023-09-06 LAB — URINALYSIS, ROUTINE W REFLEX MICROSCOPIC
Bilirubin Urine: NEGATIVE
Glucose, UA: NEGATIVE mg/dL
Hgb urine dipstick: NEGATIVE
Ketones, ur: NEGATIVE mg/dL
Leukocytes,Ua: NEGATIVE
Nitrite: NEGATIVE
Protein, ur: NEGATIVE mg/dL
Specific Gravity, Urine: 1.02 (ref 1.005–1.030)
pH: 6 (ref 5.0–8.0)

## 2023-09-06 LAB — RESP PANEL BY RT-PCR (RSV, FLU A&B, COVID)  RVPGX2
Influenza A by PCR: NEGATIVE
Influenza B by PCR: NEGATIVE
Resp Syncytial Virus by PCR: NEGATIVE
SARS Coronavirus 2 by RT PCR: NEGATIVE

## 2023-09-06 LAB — PROCALCITONIN: Procalcitonin: 0.13 ng/mL

## 2023-09-06 LAB — MAGNESIUM: Magnesium: 2.1 mg/dL (ref 1.7–2.4)

## 2023-09-06 LAB — CK: Total CK: 53 U/L (ref 38–234)

## 2023-09-06 MED ORDER — GADOBUTROL 1 MMOL/ML IV SOLN
8.0000 mL | Freq: Once | INTRAVENOUS | Status: AC | PRN
  Administered 2023-09-06: 8 mL via INTRAVENOUS

## 2023-09-06 MED ORDER — LACTATED RINGERS IV BOLUS
1000.0000 mL | Freq: Once | INTRAVENOUS | Status: AC
  Administered 2023-09-06: 1000 mL via INTRAVENOUS

## 2023-09-06 MED ORDER — LABETALOL HCL 5 MG/ML IV SOLN
5.0000 mg | INTRAVENOUS | Status: DC | PRN
  Administered 2023-09-07 – 2023-09-08 (×2): 5 mg via INTRAVENOUS
  Filled 2023-09-06 (×2): qty 4

## 2023-09-06 MED ORDER — SODIUM CHLORIDE 0.9 % IV SOLN: INTRAVENOUS | Status: AC

## 2023-09-06 MED ORDER — ACETAMINOPHEN 650 MG RE SUPP
650.0000 mg | Freq: Once | RECTAL | Status: AC | PRN
  Administered 2023-09-07: 650 mg via RECTAL
  Filled 2023-09-06: qty 1

## 2023-09-06 MED ORDER — IOHEXOL 350 MG/ML SOLN
75.0000 mL | Freq: Once | INTRAVENOUS | Status: AC | PRN
  Administered 2023-09-06: 75 mL via INTRAVENOUS

## 2023-09-06 MED ORDER — SODIUM CHLORIDE 0.9 % IV SOLN
1.0000 g | INTRAVENOUS | Status: DC
  Administered 2023-09-06 – 2023-09-08 (×3): 1 g via INTRAVENOUS
  Filled 2023-09-06 (×3): qty 10

## 2023-09-06 NOTE — H&P (Signed)
 History and Physical    [name] ZOX:[id number] DOB: [birth year] DOA: 09/06/2023  PCP: Garlan Fillers, MD  Patient coming from: Home  Chief Complaint: Generalized weakness  HPI: [name] is a 83 y.o. 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  Patient is able to give limited history.  History provided mostly by her daughter at bedside who states [facility] and staff over there reported increasing confusion/hallucinations and poor p.o. intake for the past 1 week.  Daughter states staff at the patient's nursing facility were concerned that she was having difficulty swallowing but she thinks it is due to them giving her a whole potassium pill instead of cutting it in half.  She does report patient having some cough and one time when she was feeding the patient she spit up food due to coughing.  Patient denies difficulty swallowing and tells me she just does not feel like eating or drinking.  No weight loss reported.  Daughter states patient has been complaining of left lower quadrant abdominal pain/pressure and maybe she was constipated but not entirely clear.  Daughter thinks patient might have had intermittent low-grade fevers at her facility.  ED Course: Vital signs on arrival: Temperature 98.2 F, pulse 88, respiratory rate 17, blood pressure 153/69, and SpO2 99% on room air.  Chest x-ray showing left lung base atelectasis versus infiltrate.  Brain MRI negative for acute intracranial  abnormality.  CT abdomen pelvis with contrast showing severe left hydronephrosis concerning for obstruction at the left UPJ by possible urothelial neoplasm.  Showing nonobstructing left renal inferior pole calculi and left perinephric stranding suspicious for possible superimposed infection.  In addition, showing a new indeterminate 11 mm hypoenhancing lesion in the dome of the liver; metastatic disease not excluded. Labs showing no leukocytosis, hemoglobin 10.6 (baseline 12-13), MCV 89.2, creatinine 1.2 (baseline 0.9), AST 60 and remainder of LFTs normal, CK normal, UA not suggestive of infection, urine culture pending, COVID/influenza/RSV PCR negative, procalcitonin 0.13. Patient was given 1 L LR.  ED PA discussed the case with on-call urologist who recommended keeping the patient n.p.o. after midnight and urology will consult in the morning and decide whether CT urogram needs to be done for further evaluation.  Review of Systems:  Review of Systems  All other systems reviewed and are negative.   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   GERD (gastroesophageal reflux disease)    Hyperlipidemia    Hypertension    Lumbar stenosis    OSA (obstructive sleep apnea)    "couldn't handle CPAP; use mouth guard some; not all the time" (01/06/2014)   PAF (paroxysmal atrial fibrillation) (HCC)    Scoliosis    with radiculopathy L2-S1 with prior surgery   Small bowel obstruction (HCC)    versus ileus after last bck surgery   Spondylosis     Past Surgical History:  Procedure Laterality Date   BACK SURGERY     CATARACT  EXTRACTION W/ INTRAOCULAR LENS  IMPLANT, BILATERAL  2011   CHOLECYSTECTOMY N/A 07/07/2018   Procedure: LAPAROSCOPIC CHOLECYSTECTOMY;  Surgeon: Harriette Bouillon, MD;  Location: [facility] OR;  Service: General;  Laterality:  N/A;   COLONOSCOPY  12/09/2004   LIPOMA EXCISION  1980's   "fatty tumors"   LUMBAR DISC SURGERY  02/2009   "ruptured disc"   NASAL SEPTUM SURGERY  80's   POSTERIOR LUMBAR FUSION  06/2010; 10/2011   "placed screws, rods, spacers both times"   POSTERIOR LUMBAR FUSION 4 LEVEL N/A 12/13/2018   Procedure: THORACIC NINE -LUMBAR THREE POSTERIOR INSTRUMENTATION FUSION;  Surgeon: Jadene Pierini, MD;  Location: [facility] OR;  Service: Neurosurgery;  Laterality: N/A;   REPAIR DURAL / CSF LEAK  02/2009   THORACIC LAMINECTOMY FOR EPIDURAL ABSCESS N/A 09/28/2018   Procedure: THORACIC NINE-THORACIC TEN, THORACIC TEN-THORACIC ELEVEN, THORACIC ELEVEN-THORACIC TWELVE LAMINECTOMIES FOR EPIDURAL ABSCESS;  Surgeon: Jadene Pierini, MD;  Location: [facility] OR;  Service: Neurosurgery;  Laterality: N/A;   TOTAL ABDOMINAL HYSTERECTOMY  02/1993     reports that she has never smoked. She has never used smokeless tobacco. She reports that she does not drink alcohol and does not use drugs.  Allergies  Allergen Reactions   Lyrica [Pregabalin] Other (See Comments)    Felt loopy   Zocor [Simvastatin] Other (See Comments)    Myalgias   Relafen [Nabumetone] Rash    Family History  Problem Relation Age of Onset   Arthritis Mother    Heart attack Father    Hypertension Sister    Lung cancer Sister    Asthma Sister    Allergies Sister     Prior to Admission medications   Medication Sig Start Date End Date Taking? Authorizing Provider  acetaminophen (TYLENOL 8 HOUR) 650 MG CR tablet Take 1 tablet (650 mg total) by mouth every 8 (eight) hours as needed for pain. 12/17/19   Mannie Stabile, PA-C  B Complex-Folic Acid (SUPER B COMPLEX MAXI) TABS Take by mouth.    [provider]  carbidopa-levodopa (SINEMET IR) 25-100 MG tablet 3 at 8am/2 at noon/2 at 4pm x 2 weeks and then take 2 tablets at 8am/noon/4pm Patient taking differently: Take 2 tablets by mouth 3 (three) times daily.  2 tablets at 8am/noon/4pm 11/22/22    Tat, Octaviano Batty, DO  Cholecalciferol 50 MCG (2000 UT) CAPS Take 4,000 Units by mouth daily.    [provider]  cilostazol (PLETAL) 50 MG tablet Take 50 mg by mouth 2 (two) times daily.    [provider]  DULoxetine (CYMBALTA) 60 MG capsule Take 60 mg by mouth 2 (two) times daily with a meal. Patient not taking: Reported on 01/10/2023 10/25/19   [provider]  ELIQUIS 2.5 MG TABS tablet Take 2.5 mg by mouth 2 (two) times daily. 02/27/22   [provider]  flecainide (TAMBOCOR) 50 MG tablet Take 50 mg by mouth daily.    [provider]  furosemide (LASIX) 20 MG tablet TAKE 1 TABLET(20 MG) BY MOUTH DAILY Patient taking differently: Take 20 mg by mouth daily. 04/28/20   Bhagat, Sharrell Ku, PA  gabapentin (NEURONTIN) 300 MG capsule Take 300 mg by mouth See admin instructions. 2 capsule by mouth 3 times a day for pain    [provider]  Lidocaine HCl (ASPERCREME LIDOCAINE) 4 % CREA Apply topically. PRN AS DIRECTED    [provider]  loratadine (CLARITIN) 10 MG tablet Take 10 mg by mouth daily.    [provider]  Menthol, Topical Analgesic, (BIOFREEZE EX) Apply 1 application  topically daily as needed (pain).    [provider]  metoprolol succinate (TOPROL-XL) 25 MG 24 hr tablet Take 12.5 mg by mouth daily.    [provider]  NON FORMULARY Gaba 750mg  take 1 tab once a day Patient not taking: Reported on 07/06/2023    [provider]  pantoprazole (PROTONIX) 40 MG tablet Take 40 mg by mouth daily.  Patient not taking: Reported on 01/10/2023    [provider]  polyethylene glycol (MIRALAX / GLYCOLAX) packet Take 17 g by mouth daily. Patient not taking: Reported on 07/06/2023 07/10/18   Marguerita Merles Latif, DO  potassium chloride SA (K-DUR) 20 MEQ tablet Take 1 tablet (20 mEq total) by mouth daily. Patient taking differently: Take 20 mEq by mouth 2 (two) times daily. 01/01/19   Bhagat, Sharrell Ku,  PA  pravastatin (PRAVACHOL) 40 MG tablet Take 40 mg by mouth daily with lunch.    [provider]  TRAMADOL HCL ER PO Take 50 mg by mouth 4 (four) times daily as needed (prn for pain management).    [provider]  TRAVATAN Z 0.004 % SOLN ophthalmic solution Place 1 drop into both eyes at bedtime.  01/21/15   [provider]    Physical Exam: Vitals:   09/06/23 1715 09/06/23 1730 09/06/23 2117 09/06/23 2118  BP: (!) 151/79 (!) 165/70 (!) 158/88   Pulse: 96 96 96   Resp: 17 20 19    Temp:    98.9 F (37.2 C)  TempSrc:      SpO2: 100% 98% 99%   Weight:      Height:        Physical Exam Vitals reviewed.  Constitutional:      General: She is not in acute distress. HENT:     Head: Normocephalic and atraumatic.  Eyes:     Extraocular Movements: Extraocular movements intact.  Cardiovascular:     Rate and Rhythm: Normal rate and regular rhythm.     Pulses: Normal pulses.  Pulmonary:     Effort: Pulmonary effort is normal. No respiratory distress.     Breath sounds: Normal breath sounds. No wheezing or rales.  Abdominal:     General: Bowel sounds are normal. There is no distension.     Palpations: Abdomen is soft.     Tenderness: There is abdominal tenderness. There is no guarding or rebound.     Comments: Generalized tenderness to palpation  Musculoskeletal:     Cervical back: Normal range of motion.     Right lower leg: No edema.     Left lower leg: No edema.  Skin:    General: Skin is warm and dry.  Neurological:     Mental Status: She is alert and oriented to person, place, and time. Mental status is at baseline.     Labs on Admission: I have personally reviewed following labs and imaging studies  CBC: Recent Labs  Lab 09/06/23 1220  WBC 7.9  NEUTROABS 6.1  HGB 10.6*  HCT 33.7*  MCV 89.2  PLT 234   Basic Metabolic Panel: Recent Labs  Lab 09/06/23 1220  NA 137  K 4.4  CL 104  CO2 23  GLUCOSE 117*  BUN 19  CREATININE 1.21*   CALCIUM 8.5*  MG 2.1   GFR: Estimated Creatinine Clearance: 41.4 mL/min (A) (by C-G formula based on SCr of 1.21 mg/dL (H)). Liver Function Tests: Recent Labs  Lab  09/06/23 1220  AST 60*  ALT 10  ALKPHOS 97  BILITOT 0.6  PROT 5.9*  ALBUMIN 2.5*   No results for input(s): "LIPASE", "AMYLASE" in the last 168 hours. No results for input(s): "AMMONIA" in the last 168 hours. Coagulation Profile: No results for input(s): "INR", "PROTIME" in the last 168 hours. Cardiac Enzymes: Recent Labs  Lab 09/06/23 1220  CKTOTAL 53   BNP (last 3 results) No results for input(s): "PROBNP" in the last 8760 hours. HbA1C: No results for input(s): "HGBA1C" in the last 72 hours. CBG: No results for input(s): "GLUCAP" in the last 168 hours. Lipid Profile: No results for input(s): "CHOL", "HDL", "LDLCALC", "TRIG", "CHOLHDL", "LDLDIRECT" in the last 72 hours. Thyroid Function Tests: No results for input(s): "TSH", "T4TOTAL", "FREET4", "T3FREE", "THYROIDAB" in the last 72 hours. Anemia Panel: No results for input(s): "VITAMINB12", "FOLATE", "FERRITIN", "TIBC", "IRON", "RETICCTPCT" in the last 72 hours. Urine analysis:    Component Value Date/Time   COLORURINE YELLOW 09/06/2023 1310   APPEARANCEUR CLEAR 09/06/2023 1310   LABSPEC 1.020 09/06/2023 1310   PHURINE 6.0 09/06/2023 1310   GLUCOSEU NEGATIVE 09/06/2023 1310   HGBUR NEGATIVE 09/06/2023 1310   BILIRUBINUR NEGATIVE 09/06/2023 1310   KETONESUR NEGATIVE 09/06/2023 1310   PROTEINUR NEGATIVE 09/06/2023 1310   UROBILINOGEN 1.0 06/26/2010 1009   NITRITE NEGATIVE 09/06/2023 1310   LEUKOCYTESUR NEGATIVE 09/06/2023 1310    Radiological Exams on Admission: MR Brain W and Wo Contrast Result Date: 09/06/2023 CLINICAL DATA:  Transient ischemic attack EXAM: MRI HEAD WITHOUT AND WITH CONTRAST TECHNIQUE: Multiplanar, multiecho pulse sequences of the brain and surrounding structures were obtained without and with intravenous contrast. CONTRAST:  8mL  GADAVIST GADOBUTROL 1 MMOL/ML IV SOLN COMPARISON:  11/28/2013 FINDINGS: Brain: No acute infarct, mass effect or extra-axial collection. No acute or chronic hemorrhage. There is multifocal hyperintense T2-weighted signal within the white matter. Generalized volume loss. The midline structures are normal. Dilated perivascular space of the left lentiform nucleus. No abnormal contrast enhancement. Since 11/28/2013, there has been mild progressive mid brain volume loss. Vascular: Normal flow voids. Skull and upper cervical spine: Normal calvarium and skull base. Visualized upper cervical spine and soft tissues are normal. Sinuses/Orbits:No paranasal sinus fluid levels or advanced mucosal thickening. No mastoid or middle ear effusion. Normal orbits. IMPRESSION: 1. No acute intracranial abnormality. 2. 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 3. Findings of chronic small vessel ischemia and volume loss. Electronically Signed   By: Deatra Robinson M.D.   On: 09/06/2023 21:14   CT ABDOMEN PELVIS W CONTRAST Result Date: 09/06/2023 CLINICAL DATA:  Concern for bowel obstruction. EXAM: CT ABDOMEN AND PELVIS WITH CONTRAST TECHNIQUE: Multidetector CT imaging of the abdomen and pelvis was performed using the standard protocol following bolus administration of intravenous contrast. RADIATION DOSE REDUCTION: This exam was performed according to the departmental dose-optimization program which includes automated exposure control, adjustment of the mA and/or kV according to patient size and/or use of iterative reconstruction technique. CONTRAST:  75mL OMNIPAQUE IOHEXOL 350 MG/ML SOLN COMPARISON:  CT abdomen pelvis dated 03/13/2020. FINDINGS: Lower chest: Trace left pleural effusion and left lung base atelectasis. Mild cardiomegaly. Coronary vascular calcification. No intra-abdominal free air or free fluid. Hepatobiliary: Indeterminate 11 mm hypoenhancing lesion in the dome of the  liver, new since the prior CT. Metastatic disease is not excluded. No biliary dilatation. Cholecystectomy. Pancreas: Unremarkable. No pancreatic ductal dilatation or surrounding inflammatory changes. Spleen: Normal in size without focal abnormality. Adrenals/Urinary Tract:  The adrenal glands unremarkable. There is an ill-defined high attenuating density in the left renal collecting system and pelvis measuring approximately 5.5 x 2.9 cm (coronal 44/6) which may represent blood clot or a urothelial mass. Nonobstructing left renal inferior pole calculi measuring up to 15 mm. There is severe left hydronephrosis with transition at the ureteropelvic junction. There is left renal perinephric and periureteric stranding with loss of fat plane between the kidney and left psoas muscle. 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 Urology consult and further evaluation with MRI without and with contrast or direct visualization with scope is recommended. Several small left renal cysts noted. There is no hydronephrosis on the right. There is trabeculated appearance of the bladder wall likely related to chronic bladder dysfunction. Small amount of air within the bladder may have been introduced by recent instrumentation. Stomach/Bowel: There is sigmoid diverticulosis. Moderate stool throughout the colon. There is no bowel obstruction or active inflammation. The appendix is normal. Vascular/Lymphatic: Moderate aortoiliac atherosclerotic disease. The IVC is unremarkable. No portal venous gas. There is no adenopathy. Reproductive: Hysterectomy.  No suspicious adnexal masses. Other: None Musculoskeletal: Osteopenia with degenerative changes of the spine. Extensive posterior spinal fusion hardware. No acute osseous pathology IMPRESSION: 1. Severe left hydronephrosis with transition at the ureteropelvic junction. 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 Urology  consult and further evaluation with MRI without and with contrast or direct visualization with scope is recommended. 2. Nonobstructing left renal inferior pole calculi. Left perinephric stranding suspicious for superimposed infection. Correlation with urinalysis recommended. 3. Sigmoid diverticulosis. No bowel obstruction. Normal appendix. 4. Indeterminate 11 mm hypoenhancing lesion in the dome of the liver, new since the prior CT. Metastatic disease is not excluded. Electronically Signed   By: Elgie Collard M.D.   On: 09/06/2023 20:06   DG Chest 2 View Result Date: 09/06/2023 CLINICAL DATA:  Cough, fatigue and weakness. EXAM: CHEST - 2 VIEW COMPARISON:  Chest radiograph dated 06/05/2020. FINDINGS: Shallow inspiration. Left lung base atelectasis versus infiltrate. No large pleural effusion. No pneumothorax. Stable cardiac silhouette. Atherosclerotic calcification of the aorta. No acute osseous pathology. Posterior spinal fusion hardware. IMPRESSION: Left lung base atelectasis versus infiltrate. Electronically Signed   By: Elgie Collard M.D.   On: 09/06/2023 17:25    Assessment and Plan  Severe left hydronephrosis ?UTI/pyelonephritis CT abdomen pelvis with contrast showing severe left hydronephrosis concerning for obstruction at the left UPJ by possible urothelial neoplasm.  Showing nonobstructing left renal inferior pole calculi and left perinephric stranding suspicious for possible superimposed infection.  UA not suggestive of infection.  Daughter is reporting patient having intermittent low-grade fevers at her facility.  Not tachycardic or hypotensive.  No leukocytosis on labs.  No signs of sepsis at this time. -Will cover with ceftriaxone for now and follow-up urine culture.  Also blood cultures ordered. -ED PA discussed the case with on-call urologist who recommended keeping the patient n.p.o. after midnight and urology will consult in the morning and decide whether CT urogram needs to be done  for further evaluation.  -Given presence of new liver lesion on CT concerning for possible metastatic disease, I have ordered MRI of abdomen for further evaluation.   Dysphagia Progressive supranuclear palsy Parkinson's disease Brain MRI negative for acute intracranial abnormality.  Neurology aware and not recommending any further inpatient neurologic workup.  Will keep n.p.o. until SLP eval is done in the morning.  New liver lesion CT showing a  new indeterminate 11 mm hypoenhancing lesion in the dome of the liver; metastatic disease not excluded.  MRI ordered for further evaluation.  Mild transaminase elevation Question whether related to liver lesion. AST 60 and remainder of LFTs normal.  No history of alcohol abuse.  CK normal.  Hepatitis panel ordered and continue to monitor LFTs.  Cough Daughter is reporting patient having some cough .  COVID/influenza/RSV PCR negative.  Chest x-ray showing left lung base atelectasis versus infiltrate.  No leukocytosis on labs and no significant elevation of procalcitonin.  Patient is not endorsing shortness of breath.  No tachypnea or hypoxia.  Pneumonia less likely.  Continue ceftriaxone for now to cover for possible complicated UTI.  Normocytic anemia Hemoglobin 10.6 (baseline 12-13).  No symptoms of GI bleed or any other bleeding reported by the patient or her family.  Anemia panel ordered.  AKI on CKD stage IIIa Likely prerenal from dehydration given dysphagia/poor p.o. intake. Creatinine 1.2 (baseline 0.9).  Previous echo with normal EF.  Give IV fluid hydration and follow-up repeat labs in the morning.  Avoid nephrotoxic agents.  Hyperlipidemia Hold statin at this time and monitor LFTs.  Paroxysmal A-fib Pharmacy med rec pending.  Hold anticoagulation until patient is evaluated by urology.  Hypertension Pharmacy med rec pending.  IV labetalol PRN SBP >160.  DVT prophylaxis: SCDs Code Status: DNR/DNI (discussed with the patient and her  family) Family Communication: Daughter [name] was at bedside and daughter [name] was on speaker phone. Consults called: Urology Level of care: Telemetry bed Admission status: It is my clinical opinion that referral for OBSERVATION is reasonable and necessary in this patient based on the above information provided. The aforementioned taken together are felt to place the patient at high risk for further clinical deterioration. However, it is anticipated that the patient may be medically stable for discharge from the hospital within 24 to 48 hours.  [name] Giovanni MD [facility]  If 7PM-7AM, please contact night-coverage [email or web address]  09/06/2023, 9:21 PM

## 2023-09-06 NOTE — ED Provider Notes (Incomplete)
  EMERGENCY DEPARTMENT AT [facility] Provider Note   CSN: [id number] Arrival date & time: 09/06/23  1159     History Chief Complaint  Patient presents with   Fatigue    [name] is a 83 y.o. female with history of progressive supranuclear palsy, A-fib, neuropathy, hyperlipidemia presents the emerged from today for evaluation of generalized weakness for the past 1 to 2 weeks.  Per patient, she has not felt like much of an appetite.  She initially said that she felt like she was not wanting to eat or drink because of difficulty swallowing however then denied this.  She does not believe she is having any trouble swallowing however does have coughing when trying to eat and drink.  Family at bedside reports that she did have a cough around 2 weeks ago and tested negative for COVID and flu.  They were giving her nasal spray and an allergy medication however did not like this change much.  Has an occasional cough again but this is mainly with eating and drinking.  No fevers greater than 100 at the facility.  Daughters also report that she has been more acutely confused for the past 1 to 2 weeks as well.  Having hallucinations seeing monkeys.  Mention yesterday she was doing her taxes, patient was not.  They report that she usually has a urinary tract infection when presented with the symptoms.  She was tested at the facility and did not have 1.  Family is still concerned for this.  Would also like you tested for COVID and flu.  Additionally, family reports that the patient is feels like she has not had a decent bowel movement however she did have a bowel movement upon presentation today.  She currently has no complaints at this time.  Note from PCP gives conflicting information.  They also mention the patient's been having trouble swallowing but no other cough or cold symptoms or flulike symptoms.  PCP has been giving her subcutaneous fluids.  Appears that her creatinine few days  prior was 1.47   Of note, patient has also had botox into her left lower leg to help with foot drop and muscle contractures. The bruise and sore was not from this and was from the patient hitting it on the bed, per family.   HPI     Home Medications Prior to Admission medications   Medication Sig Start Date End Date Taking? Authorizing Provider  acetaminophen (TYLENOL 8 HOUR) 650 MG CR tablet Take 1 tablet (650 mg total) by mouth every 8 (eight) hours as needed for pain. 12/17/19   Mannie Stabile, PA-C  B Complex-Folic Acid (SUPER B COMPLEX MAXI) TABS Take by mouth.    [provider]  carbidopa-levodopa (SINEMET IR) 25-100 MG tablet 3 at 8am/2 at noon/2 at 4pm x 2 weeks and then take 2 tablets at 8am/noon/4pm Patient taking differently: Take 2 tablets by mouth 3 (three) times daily.  2 tablets at 8am/noon/4pm 11/22/22   Tat, Octaviano Batty, DO  Cholecalciferol 50 MCG (2000 UT) CAPS Take 4,000 Units by mouth daily.    [provider]  cilostazol (PLETAL) 50 MG tablet Take 50 mg by mouth 2 (two) times daily.    [provider]  DULoxetine (CYMBALTA) 60 MG capsule Take 60 mg by mouth 2 (two) times daily with a meal. Patient not taking: Reported on 01/10/2023 10/25/19   [provider]  ELIQUIS 2.5 MG TABS tablet Take 2.5 mg by  mouth 2 (two) times daily. 02/27/22   [provider]  flecainide (TAMBOCOR) 50 MG tablet Take 50 mg by mouth daily.    [provider]  furosemide (LASIX) 20 MG tablet TAKE 1 TABLET(20 MG) BY MOUTH DAILY Patient taking differently: Take 20 mg by mouth daily. 04/28/20   Bhagat, Sharrell Ku, PA  gabapentin (NEURONTIN) 300 MG capsule Take 300 mg by mouth See admin instructions. 2 capsule by mouth 3 times a day for pain    [provider]  Lidocaine HCl (ASPERCREME LIDOCAINE) 4 % CREA Apply topically. PRN AS DIRECTED    [provider]  loratadine (CLARITIN) 10 MG tablet Take 10 mg by mouth daily.     [provider]  Menthol, Topical Analgesic, (BIOFREEZE EX) Apply 1 application  topically daily as needed (pain).    [provider]  metoprolol succinate (TOPROL-XL) 25 MG 24 hr tablet Take 12.5 mg by mouth daily.    [provider]  NON FORMULARY Gaba 750mg  take 1 tab once a day Patient not taking: Reported on 07/06/2023    [provider]  pantoprazole (PROTONIX) 40 MG tablet Take 40 mg by mouth daily.  Patient not taking: Reported on 01/10/2023    [provider]  polyethylene glycol (MIRALAX / GLYCOLAX) packet Take 17 g by mouth daily. Patient not taking: Reported on 07/06/2023 07/10/18   Marguerita Merles Latif, DO  potassium chloride SA (K-DUR) 20 MEQ tablet Take 1 tablet (20 mEq total) by mouth daily. Patient taking differently: Take 20 mEq by mouth 2 (two) times daily. 01/01/19   Bhagat, Sharrell Ku, PA  pravastatin (PRAVACHOL) 40 MG tablet Take 40 mg by mouth daily with lunch.    [provider]  TRAMADOL HCL ER PO Take 50 mg by mouth 4 (four) times daily as needed (prn for pain management).    [provider]  TRAVATAN Z 0.004 % SOLN ophthalmic solution Place 1 drop into both eyes at bedtime.  01/21/15   [provider]      Allergies    Lyrica [pregabalin], Zocor [simvastatin], and Relafen [nabumetone]    Review of Systems   Review of Systems  Constitutional:  Positive for appetite change and fatigue. Negative for chills and fever.  HENT:  Negative for congestion, rhinorrhea and sore throat.   Respiratory:  Positive for cough. Negative for shortness of breath.   Cardiovascular:  Negative for chest pain.  Gastrointestinal:  Positive for constipation. Negative for abdominal pain, diarrhea, nausea and vomiting.  Genitourinary:  Negative for dysuria.  Neurological:  Positive for weakness. Negative for headaches.    Physical Exam Updated Vital Signs BP (!) 181/80   Pulse 98   Temp 98.9 F (37.2 C)   Resp 16   Ht  5\' 6"  (1.676 m)   Wt 88 kg   SpO2 94%   BMI 31.31 kg/m  Physical Exam Vitals and nursing note reviewed.  Constitutional:      Appearance: She is not toxic-appearing.     Comments: Chronically ill-appearing, no acute distress  HENT:     Mouth/Throat:     Mouth: Mucous membranes are dry.  Eyes:     General: No scleral icterus.    Extraocular Movements: Extraocular movements intact.     Pupils: Pupils are equal, round, and reactive to light.  Cardiovascular:     Rate and Rhythm: Normal rate.  Pulmonary:     Effort: Pulmonary effort is normal. No respiratory distress.     Breath  sounds: Normal breath sounds.  Abdominal:     Palpations: Abdomen is soft.     Tenderness: There is abdominal tenderness. There is no guarding or rebound.     Comments: Minimal tenderness to her right upper quadrant.  Patient reports that at 1 out of 10 upon palpation.  Does not have any pain without palpation.  No guarding or rebound.  Musculoskeletal:     Cervical back: Normal range of motion.     Right lower leg: No edema.     Left lower leg: No edema.     Comments: Does have a small bruise with Steri-Strip present to the lateral aspect of the lower left leg.  Compartments are soft.  Cushion boots in place.  Skin:    General: Skin is warm and dry.  Neurological:     Mental Status: She is alert.     Comments: Generalized weakness but no focal weakness noted.  Cranial nerves difficult to assess due to patient's PSP with the facial making.  She is oriented x 4.  Slower speech however is at baseline.  Answering questions appropriately.     ED Results / Procedures / Treatments   Labs (all labs ordered are listed, but only abnormal results are displayed) Labs Reviewed  CBC WITH DIFFERENTIAL/PLATELET - Abnormal; Notable for the following components:      Result Value   RBC 3.78 (*)    Hemoglobin 10.6 (*)    HCT 33.7 (*)    All other components within normal limits  COMPREHENSIVE METABOLIC PANEL WITH  GFR - Abnormal; Notable for the following components:   Glucose, Bld 117 (*)    Creatinine, Ser 1.21 (*)    Calcium 8.5 (*)    Total Protein 5.9 (*)    Albumin 2.5 (*)    AST 60 (*)    GFR, Estimated 45 (*)    All other components within normal limits  RESP PANEL BY RT-PCR (RSV, FLU A&B, COVID)  RVPGX2  URINE CULTURE  CULTURE, BLOOD (ROUTINE X 2)  CULTURE, BLOOD (ROUTINE X 2)  URINALYSIS, ROUTINE W REFLEX MICROSCOPIC  CK  MAGNESIUM  PROCALCITONIN  CBC  COMPREHENSIVE METABOLIC PANEL WITH GFR  VITAMIN B12  FOLATE  IRON AND TIBC  FERRITIN  RETICULOCYTES  HEPATITIS PANEL, ACUTE    EKG None  Radiology MR Brain W and Wo Contrast Result Date: 09/06/2023 CLINICAL DATA:  Transient ischemic attack EXAM: MRI HEAD WITHOUT AND WITH CONTRAST TECHNIQUE: Multiplanar, multiecho pulse sequences of the brain and surrounding structures were obtained without and with intravenous contrast. CONTRAST:  8mL GADAVIST GADOBUTROL 1 MMOL/ML IV SOLN COMPARISON:  11/28/2013 FINDINGS: Brain: No acute infarct, mass effect or extra-axial collection. No acute or chronic hemorrhage. There is multifocal hyperintense T2-weighted signal within the white matter. Generalized volume loss. The midline structures are normal. Dilated perivascular space of the left lentiform nucleus. No abnormal contrast enhancement. Since 11/28/2013, there has been mild progressive mid brain volume loss. Vascular: Normal flow voids. Skull and upper cervical spine: Normal calvarium and skull base. Visualized upper cervical spine and soft tissues are normal. Sinuses/Orbits:No paranasal sinus fluid levels or advanced mucosal thickening. No mastoid or middle ear effusion. Normal orbits. IMPRESSION: 1. No acute intracranial abnormality. 2. 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 3. Findings of chronic small vessel ischemia and volume loss. Electronically Signed   By: Deatra Robinson M.D.   On:  09/06/2023 21:14   CT ABDOMEN PELVIS W CONTRAST Result  Date: 09/06/2023 CLINICAL DATA:  Concern for bowel obstruction. EXAM: CT ABDOMEN AND PELVIS WITH CONTRAST TECHNIQUE: Multidetector CT imaging of the abdomen and pelvis was performed using the standard protocol following bolus administration of intravenous contrast. RADIATION DOSE REDUCTION: This exam was performed according to the departmental dose-optimization program which includes automated exposure control, adjustment of the mA and/or kV according to patient size and/or use of iterative reconstruction technique. CONTRAST:  75mL OMNIPAQUE IOHEXOL 350 MG/ML SOLN COMPARISON:  CT abdomen pelvis dated 03/13/2020. FINDINGS: Lower chest: Trace left pleural effusion and left lung base atelectasis. Mild cardiomegaly. Coronary vascular calcification. No intra-abdominal free air or free fluid. Hepatobiliary: Indeterminate 11 mm hypoenhancing lesion in the dome of the liver, new since the prior CT. Metastatic disease is not excluded. No biliary dilatation. Cholecystectomy. Pancreas: Unremarkable. No pancreatic ductal dilatation or surrounding inflammatory changes. Spleen: Normal in size without focal abnormality. Adrenals/Urinary Tract: The adrenal glands unremarkable. There is an ill-defined high attenuating density in the left renal collecting system and pelvis measuring approximately 5.5 x 2.9 cm (coronal 44/6) which may represent blood clot or a urothelial mass. Nonobstructing left renal inferior pole calculi measuring up to 15 mm. There is severe left hydronephrosis with transition at the ureteropelvic junction. There is left renal perinephric and periureteric stranding with loss of fat plane between the kidney and left psoas muscle. 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 Urology consult and further evaluation with MRI without and with contrast or direct visualization with scope is recommended. Several small left renal cysts  noted. There is no hydronephrosis on the right. There is trabeculated appearance of the bladder wall likely related to chronic bladder dysfunction. Small amount of air within the bladder may have been introduced by recent instrumentation. Stomach/Bowel: There is sigmoid diverticulosis. Moderate stool throughout the colon. There is no bowel obstruction or active inflammation. The appendix is normal. Vascular/Lymphatic: Moderate aortoiliac atherosclerotic disease. The IVC is unremarkable. No portal venous gas. There is no adenopathy. Reproductive: Hysterectomy.  No suspicious adnexal masses. Other: None Musculoskeletal: Osteopenia with degenerative changes of the spine. Extensive posterior spinal fusion hardware. No acute osseous pathology IMPRESSION: 1. Severe left hydronephrosis with transition at the ureteropelvic junction. 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 Urology consult and further evaluation with MRI without and with contrast or direct visualization with scope is recommended. 2. Nonobstructing left renal inferior pole calculi. Left perinephric stranding suspicious for superimposed infection. Correlation with urinalysis recommended. 3. Sigmoid diverticulosis. No bowel obstruction. Normal appendix. 4. Indeterminate 11 mm hypoenhancing lesion in the dome of the liver, new since the prior CT. Metastatic disease is not excluded. Electronically Signed   By: Elgie Collard M.D.   On: 09/06/2023 20:06   DG Chest 2 View Result Date: 09/06/2023 CLINICAL DATA:  Cough, fatigue and weakness. EXAM: CHEST - 2 VIEW COMPARISON:  Chest radiograph dated 06/05/2020. FINDINGS: Shallow inspiration. Left lung base atelectasis versus infiltrate. No large pleural effusion. No pneumothorax. Stable cardiac silhouette. Atherosclerotic calcification of the aorta. No acute osseous pathology. Posterior spinal fusion hardware. IMPRESSION: Left lung base atelectasis versus infiltrate.  Electronically Signed   By: Elgie Collard M.D.   On: 09/06/2023 17:25    Procedures Procedures   Medications Ordered in ED Medications  cefTRIAXone (ROCEPHIN) 1 g in sodium chloride 0.9 % 100 mL IVPB (0 g Intravenous Stopped 09/06/23 2350)  0.9 %  sodium chloride infusion (has no administration in time range)  labetalol (NORMODYNE)  injection 5 mg (has no administration in time range)  acetaminophen (TYLENOL) suppository 650 mg (has no administration in time range)  gadobutrol (GADAVIST) 1 MMOL/ML injection 8 mL (8 mLs Intravenous Contrast Given 09/06/23 1837)  iohexol (OMNIPAQUE) 350 MG/ML injection 75 mL (75 mLs Intravenous Contrast Given 09/06/23 1947)  lactated ringers bolus 1,000 mL (0 mLs Intravenous Stopped 09/06/23 2256)    ED Course/ Medical Decision Making/ A&P Clinical Course as of 09/07/23 0043  Tue Sep 06, 2023  2036 Urology would like to look for other infection first. Have her be NPO at midnight. If she worsens or becomes more febrile, re-consult over night. She does not think antibiotics are needed. Thinks she has larger intrarenal cysts that could be causing this from looking at CT from 2021. Will see her tomorrow in the AM to make any other decision. CT Urogram can be done during this hospitalization.   Dr. Gordan Payment [RR]  Wed Sep 07, 2023  0038 Procalcitonin: 0.13 [RR]    Clinical Course User Index [RR] Achille Rich, PA-C    Medical Decision Making Amount and/or Complexity of Data Reviewed Labs: ordered. Decision-making details documented in ED Course. Radiology: ordered.  Risk Prescription drug management. Decision regarding hospitalization.   83 y.o. female presents to the ER for evaluation of generalized fatigue, worsening swallowing, intermittent confusion. Differential diagnosis includes but is not limited to wide differential including electrolyte abnormality, swallowing difficulty from worsening PSP/stroke, UTI. Vital signs elevated BP otherwise  unremarkable. Physical exam as noted above.   I discussed this case with Dr. Dierdre Harness with neuro.  Recommending MRI with and without contrast given PSP and to rule out new stroke.  Given the new history given by patient's family who is now at bedside, will order CT of the belly as well as chest x-ray.  Lab still pending.  I independently reviewed and interpreted the patient's labs.  COVID, flu, RSV negative.  Magnesium CK within normal limits.  Urinalysis unremarkable.  Urine culture pending.  CBC shows mild anemia with hemoglobin 10.6.  No leukocytosis.  CMP does show a creatinine 1.2 however does still AKI criteria.  Glucose at 117.  Mildly decreased calcium, total protein, and albumin.  AST is mildly elevated at 60.  No other electrolyte or LFT abnormality.  Patient given fluids for AKI.  Likely think she has an AKI in the setting of lack of p.o. intake and dehydration.  MRI shows 1. No acute intracranial abnormality. 2. 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 3. Findings of chronic small vessel ischemia and volume loss.  CXR shows Left lung base atelectasis versus infiltrate. Per radiologist's interpretation.  Given her clear lung sounds, will add procalcitonin.  Will leave up to hospitalist for antibiotic if they think this is more of pneumonia versus atelectasis.    CT evaluation  1. Severe left hydronephrosis with transition at the ureteropelvic junction. 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 Urology consult and further evaluation with MRI without and with contrast or direct visualization with scope is recommended. 2. Nonobstructing left renal inferior pole calculi. Left perinephric stranding suspicious for superimposed infection. Correlation with urinalysis recommended. 3. Sigmoid diverticulosis. No bowel obstruction. Normal appendix. 4. Indeterminate 11 mm hypoenhancing lesion in the dome of the liver, new  since the prior CT. Metastatic disease is not excluded. Per radiologist's interpretation.    Because of the hydronephrosis, consulted urology.  Please see ED course recommendations.  They do  not see any emergent need for intervention tonight.  If the patient does have worsening condition or fever to reconsult them in the night.  Patient does have a slightly elevated rectal temperature at 100.2 however does not have any leukocytosis or tachycardia.  Will need continued trends.  Patient's MRI does not show any acute findings.  Expected findings to the patient's health history.  She may have some worsening of her PSP symptoms because of some underlying dehydration or infection.  This also could be worsening of her PSP.  Will need further investigation and monitoring while inpatient for this.  I have discussed the case with the patient and the family members at bedside.  We reviewed the labs and imaging as well as my consultations and the tentative plan for admission.  Patient and family are amenable to this.  I discussed this case with the [facility] hospitalist Dr. Loney Loh who will admit.  Portions of this report may have been transcribed using voice recognition software. Every effort was made to ensure accuracy; however, inadvertent computerized transcription errors may be present.    Final Clinical Impression(s) / ED Diagnoses Final diagnoses:  AKI (acute kidney injury) (HCC)  Generalized weakness  Subacute cough  Dysphagia, unspecified type  Hydronephrosis, unspecified hydronephrosis type  Constipation, unspecified constipation type    Rx / DC Orders ED Discharge Orders     None         Achille Rich, PA-C 09/07/23 0043    Achille Rich, PA-C 09/07/23 1610    Gwyneth Sprout, MD 09/08/23 1811

## 2023-09-06 NOTE — Plan of Care (Signed)
 Discussed with ED PA, several days to 1-2 weeks of worsening swallow in patient with known PSP, no other symptoms per PA report Labs with AKI, elevated LFTs; suspect toxic/metabolic issues may be contributing to decompensation Recommend MRI brain w/ and w/o to eval for acute intracranial process  Reach out to inpatient neurology if needed   Brooke Dare MD-PhD [facility] [phone]  These are curbside recommendations based upon the information readily available in the chart on brief review as well as history and examination information provided to me by requesting provider and do not replace a full detailed consult

## 2023-09-06 NOTE — Plan of Care (Signed)
 Signed out by the daytime neurologist to follow-up on the MRI.  MRI negative for acute process. No further inpatient neurological workup or consultation needed at this time.  -- Milon Dikes, MD Neurologist [facility]

## 2023-09-06 NOTE — ED Triage Notes (Signed)
 Gen weakness and rapid decline x2 weeks. From SNF [facility]

## 2023-09-07 ENCOUNTER — Observation Stay (HOSPITAL_COMMUNITY)

## 2023-09-07 ENCOUNTER — Inpatient Hospital Stay (HOSPITAL_COMMUNITY)

## 2023-09-07 DIAGNOSIS — Z7902 Long term (current) use of antithrombotics/antiplatelets: Secondary | ICD-10-CM | POA: Diagnosis not present

## 2023-09-07 DIAGNOSIS — R1310 Dysphagia, unspecified: Secondary | ICD-10-CM | POA: Diagnosis present

## 2023-09-07 DIAGNOSIS — Z79899 Other long term (current) drug therapy: Secondary | ICD-10-CM | POA: Diagnosis not present

## 2023-09-07 DIAGNOSIS — I130 Hypertensive heart and chronic kidney disease with heart failure and stage 1 through stage 4 chronic kidney disease, or unspecified chronic kidney disease: Secondary | ICD-10-CM | POA: Diagnosis present

## 2023-09-07 DIAGNOSIS — N1330 Unspecified hydronephrosis: Secondary | ICD-10-CM | POA: Diagnosis present

## 2023-09-07 DIAGNOSIS — I480 Paroxysmal atrial fibrillation: Secondary | ICD-10-CM | POA: Diagnosis present

## 2023-09-07 DIAGNOSIS — K219 Gastro-esophageal reflux disease without esophagitis: Secondary | ICD-10-CM | POA: Diagnosis present

## 2023-09-07 DIAGNOSIS — Q6211 Congenital occlusion of ureteropelvic junction: Secondary | ICD-10-CM | POA: Diagnosis not present

## 2023-09-07 DIAGNOSIS — E860 Dehydration: Secondary | ICD-10-CM | POA: Diagnosis present

## 2023-09-07 DIAGNOSIS — N179 Acute kidney failure, unspecified: Secondary | ICD-10-CM

## 2023-09-07 DIAGNOSIS — Z1152 Encounter for screening for COVID-19: Secondary | ICD-10-CM | POA: Diagnosis not present

## 2023-09-07 DIAGNOSIS — E785 Hyperlipidemia, unspecified: Secondary | ICD-10-CM | POA: Diagnosis present

## 2023-09-07 DIAGNOSIS — D649 Anemia, unspecified: Secondary | ICD-10-CM | POA: Diagnosis present

## 2023-09-07 DIAGNOSIS — Z7901 Long term (current) use of anticoagulants: Secondary | ICD-10-CM | POA: Diagnosis not present

## 2023-09-07 DIAGNOSIS — G231 Progressive supranuclear ophthalmoplegia [Steele-Richardson-Olszewski]: Secondary | ICD-10-CM | POA: Diagnosis present

## 2023-09-07 DIAGNOSIS — Z801 Family history of malignant neoplasm of trachea, bronchus and lung: Secondary | ICD-10-CM | POA: Diagnosis not present

## 2023-09-07 DIAGNOSIS — Z8249 Family history of ischemic heart disease and other diseases of the circulatory system: Secondary | ICD-10-CM | POA: Diagnosis not present

## 2023-09-07 DIAGNOSIS — N136 Pyonephrosis: Secondary | ICD-10-CM | POA: Diagnosis present

## 2023-09-07 DIAGNOSIS — Z66 Do not resuscitate: Secondary | ICD-10-CM | POA: Diagnosis present

## 2023-09-07 DIAGNOSIS — K5900 Constipation, unspecified: Secondary | ICD-10-CM | POA: Diagnosis present

## 2023-09-07 DIAGNOSIS — K7689 Other specified diseases of liver: Secondary | ICD-10-CM | POA: Diagnosis present

## 2023-09-07 DIAGNOSIS — I509 Heart failure, unspecified: Secondary | ICD-10-CM | POA: Diagnosis present

## 2023-09-07 DIAGNOSIS — N1831 Chronic kidney disease, stage 3a: Secondary | ICD-10-CM | POA: Diagnosis present

## 2023-09-07 DIAGNOSIS — Z825 Family history of asthma and other chronic lower respiratory diseases: Secondary | ICD-10-CM | POA: Diagnosis not present

## 2023-09-07 DIAGNOSIS — G20A1 Parkinson's disease without dyskinesia, without mention of fluctuations: Secondary | ICD-10-CM | POA: Diagnosis present

## 2023-09-07 DIAGNOSIS — G629 Polyneuropathy, unspecified: Secondary | ICD-10-CM | POA: Diagnosis present

## 2023-09-07 LAB — COMPREHENSIVE METABOLIC PANEL WITH GFR
ALT: 27 U/L (ref 0–44)
AST: 49 U/L — ABNORMAL HIGH (ref 15–41)
Albumin: 2.4 g/dL — ABNORMAL LOW (ref 3.5–5.0)
Alkaline Phosphatase: 94 U/L (ref 38–126)
Anion gap: 12 (ref 5–15)
BUN: 16 mg/dL (ref 8–23)
CO2: 22 mmol/L (ref 22–32)
Calcium: 8.5 mg/dL — ABNORMAL LOW (ref 8.9–10.3)
Chloride: 104 mmol/L (ref 98–111)
Creatinine, Ser: 1.25 mg/dL — ABNORMAL HIGH (ref 0.44–1.00)
GFR, Estimated: 44 mL/min — ABNORMAL LOW (ref >60)
Glucose, Bld: 94 mg/dL (ref 70–99)
Potassium: 4.2 mmol/L (ref 3.5–5.1)
Sodium: 138 mmol/L (ref 135–145)
Total Bilirubin: 0.6 mg/dL (ref 0.0–1.2)
Total Protein: 5.7 g/dL — ABNORMAL LOW (ref 6.5–8.1)

## 2023-09-07 LAB — IRON AND TIBC
Iron: 37 ug/dL (ref 28–170)
Saturation Ratios: 19 % (ref 10.4–31.8)
TIBC: 196 ug/dL — ABNORMAL LOW (ref 250–450)
UIBC: 159 ug/dL

## 2023-09-07 LAB — CBC
HCT: 31.6 % — ABNORMAL LOW (ref 36.0–46.0)
Hemoglobin: 9.6 g/dL — ABNORMAL LOW (ref 12.0–15.0)
MCH: 28 pg (ref 26.0–34.0)
MCHC: 30.4 g/dL (ref 30.0–36.0)
MCV: 92.1 fL (ref 80.0–100.0)
Platelets: 237 10*3/uL (ref 150–400)
RBC: 3.43 MIL/uL — ABNORMAL LOW (ref 3.87–5.11)
RDW: 15 % (ref 11.5–15.5)
WBC: 8 10*3/uL (ref 4.0–10.5)
nRBC: 0 % (ref 0.0–0.2)

## 2023-09-07 LAB — HEPATITIS PANEL, ACUTE
HCV Ab: NONREACTIVE
Hep A IgM: NONREACTIVE
Hep B C IgM: NONREACTIVE
Hepatitis B Surface Ag: NONREACTIVE

## 2023-09-07 LAB — RETICULOCYTES
Immature Retic Fract: 18.9 % — ABNORMAL HIGH (ref 2.3–15.9)
RBC.: 3.73 MIL/uL — ABNORMAL LOW (ref 3.87–5.11)
Retic Count, Absolute: 31.7 10*3/uL (ref 19.0–186.0)
Retic Ct Pct: 0.9 % (ref 0.4–3.1)

## 2023-09-07 LAB — FERRITIN: Ferritin: 328 ng/mL — ABNORMAL HIGH (ref 11–307)

## 2023-09-07 LAB — FOLATE: Folate: >40 ng/mL (ref >5.9)

## 2023-09-07 LAB — VITAMIN B12: Vitamin B-12: 610 pg/mL (ref 180–914)

## 2023-09-07 MED ORDER — CAPSAICIN 0.075 % EX CREA
TOPICAL_CREAM | Freq: Two times a day (BID) | CUTANEOUS | Status: DC
  Filled 2023-09-07: qty 57

## 2023-09-07 MED ORDER — METOPROLOL SUCCINATE ER 25 MG PO TB24
12.5000 mg | ORAL_TABLET | Freq: Every day | ORAL | Status: DC
  Administered 2023-09-07 – 2023-09-09 (×3): 12.5 mg via ORAL
  Filled 2023-09-07 (×3): qty 1

## 2023-09-07 MED ORDER — APIXABAN 2.5 MG PO TABS
2.5000 mg | ORAL_TABLET | Freq: Two times a day (BID) | ORAL | Status: DC
  Administered 2023-09-07 – 2023-09-09 (×5): 2.5 mg via ORAL
  Filled 2023-09-07 (×5): qty 1

## 2023-09-07 MED ORDER — CARBIDOPA-LEVODOPA 25-100 MG PO TABS
2.0000 | ORAL_TABLET | ORAL | Status: DC
  Administered 2023-09-07 – 2023-09-09 (×6): 2 via ORAL
  Filled 2023-09-07 (×6): qty 2

## 2023-09-07 MED ORDER — LIDOCAINE 4 % EX CREA
TOPICAL_CREAM | Freq: Two times a day (BID) | CUTANEOUS | Status: DC
  Administered 2023-09-07 – 2023-09-09 (×5): 1 via TOPICAL
  Filled 2023-09-07 (×2): qty 5

## 2023-09-07 MED ORDER — GADOBUTROL 1 MMOL/ML IV SOLN
9.0000 mL | Freq: Once | INTRAVENOUS | Status: AC | PRN
  Administered 2023-09-07: 9 mL via INTRAVENOUS

## 2023-09-07 MED ORDER — GABAPENTIN 300 MG PO CAPS
600.0000 mg | ORAL_CAPSULE | Freq: Three times a day (TID) | ORAL | Status: DC
  Administered 2023-09-07 – 2023-09-08 (×3): 600 mg via ORAL
  Filled 2023-09-07 (×3): qty 2

## 2023-09-07 MED ORDER — FLECAINIDE ACETATE 50 MG PO TABS
50.0000 mg | ORAL_TABLET | Freq: Every day | ORAL | Status: DC
  Administered 2023-09-07 – 2023-09-09 (×3): 50 mg via ORAL
  Filled 2023-09-07 (×3): qty 1

## 2023-09-07 MED ORDER — TRAMADOL HCL 50 MG PO TABS
50.0000 mg | ORAL_TABLET | Freq: Four times a day (QID) | ORAL | Status: DC
  Administered 2023-09-07 – 2023-09-09 (×9): 50 mg via ORAL
  Filled 2023-09-07 (×9): qty 1

## 2023-09-07 MED ORDER — LIDOCAINE HCL 4 % EX CREA: 1.0000 | TOPICAL_CREAM | Freq: Two times a day (BID) | CUTANEOUS | Status: DC

## 2023-09-07 NOTE — Progress Notes (Signed)
 Pt went down with transport for MRI

## 2023-09-07 NOTE — ED Notes (Signed)
 Patient returned from MRI, MRI tech stated that she needs to wait at least 24 hours before having another dose of contrast.

## 2023-09-07 NOTE — Plan of Care (Signed)

## 2023-09-07 NOTE — Progress Notes (Signed)
  Progress Note   Patient: [name] YQM:[id number] DOB: [birth year] DOA: 09/06/2023     0 DOS: the patient was seen and examined on 09/07/2023   Brief hospital course: 83 y.o. 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  Found to have left hydronephrosis and possible UTI.  Assessment and Plan:  Severe left hydronephrosis - CT abdomen pelvis with contrast noting severe left hydronephrosis with obstruction of left UPJ.  Urology consulted and following closely.  Pending MRI abdomen for further evaluation.   Urinary tract infection with possible pyelonephritis - Noted stranding on CT however no leukocytosis or outright fever.  Empiric ceftriaxone on board.  Monitor urine culture results.  New liver lesion - CT noting new 11 mm lesion.  Concern for metastatic disease.  MRI pending later this evening.  Cough - Viral swabs negative.  No leukocytosis nor hypoxia nor tachypnea.  Normocytic anemia - Does not appear to have any acute bleeding.  Monitor hemoglobin.  Acute kidney injury on CKD 3 - Likely secondary to obstruction from above complicated by poor p.o. intake.  Will recheck BMP in AM.  Will need to wait 24 hours between contrast bolus for imaging.  Paroxysmal atrial fibrillation - Will restart patient's home medication regiment.  Hypertension - Will restart patient's home medication regiment.     Subjective: Patient resting in bed, says she does not feel well this morning.  Overall malaise.  Denies shortness of breath,  chest pain, nausea, vomiting, abdominal pain.  Complaining of lower extremity neuropathy pain which is chronic for her.  Physical Exam: Vitals:   09/07/23 0130 09/07/23 0145 09/07/23 0316 09/07/23 0839  BP: (!) 149/80 (!) 156/73 (!) 157/64 (!) 179/72  Pulse: (!) 102 (!) 101 93 95  Resp:  19 18 17   Temp:   98.9 F (37.2 C) 99.9 F (37.7 C)  TempSrc:   Oral Oral  SpO2: 96% 97% 97% 93%  Weight:      Height:       GENERAL:  Alert, pleasant, ill-appearing HEENT:  EOMI CARDIOVASCULAR:  RRR, no murmurs appreciated RESPIRATORY:  Clear to auscultation, no wheezing, rales, or rhonchi GASTROINTESTINAL:  Soft, nontender, nondistended EXTREMITIES:  No LE edema bilaterally NEURO:  No new focal deficits appreciated SKIN:  No rashes noted PSYCH:  Appropriate mood and affect   Data Reviewed:  There are no new results to review at this time.  Family Communication: Daughter at bedside  Disposition: Status is: Inpatient Remains inpatient appropriate because: Pyelonephritis with possible obstruction and hydronephrosis  Planned Discharge Destination: Home with Home Health    Time spent: 36 minutes  Author: Deanna Artis, DO 09/07/2023 11:35 AM  For on call review [email or web address].

## 2023-09-07 NOTE — Evaluation (Signed)
 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   GERD (gastroesophageal reflux disease)    Hyperlipidemia    Hypertension    Lumbar stenosis    OSA (obstructive sleep apnea)    "couldn't handle CPAP; use mouth guard some; not all the time" (01/06/2014)   PAF (paroxysmal atrial fibrillation) (HCC)    Scoliosis    with radiculopathy L2-S1 with prior surgery   Small bowel obstruction (HCC)    versus ileus after last bck surgery   Spondylosis    Past Surgical History:  Past Surgical History:  Procedure Laterality Date   BACK SURGERY     CATARACT EXTRACTION W/ INTRAOCULAR LENS  IMPLANT, BILATERAL  2011   CHOLECYSTECTOMY N/A 07/07/2018   Procedure: LAPAROSCOPIC CHOLECYSTECTOMY;  Surgeon: Harriette Bouillon, MD;  Location: [facility] OR;  Service: General;  Laterality: N/A;   COLONOSCOPY  12/09/2004   LIPOMA EXCISION  1980's   "fatty tumors"   LUMBAR DISC SURGERY  02/2009   "ruptured disc"   NASAL SEPTUM SURGERY  80's   POSTERIOR LUMBAR FUSION  06/2010; 10/2011   "placed screws, rods, spacers both times"   POSTERIOR LUMBAR FUSION 4 LEVEL N/A 12/13/2018   Procedure: THORACIC NINE -LUMBAR THREE POSTERIOR INSTRUMENTATION FUSION;  Surgeon: Jadene Pierini, MD;  Location: [facility] OR;  Service: Neurosurgery;  Laterality: N/A;   REPAIR DURAL / CSF LEAK  02/2009   THORACIC LAMINECTOMY FOR EPIDURAL ABSCESS N/A 09/28/2018   Procedure: THORACIC NINE-THORACIC TEN, THORACIC TEN-THORACIC ELEVEN, THORACIC ELEVEN-THORACIC TWELVE LAMINECTOMIES FOR EPIDURAL ABSCESS;   Surgeon: Jadene Pierini, MD;  Location: [facility] OR;  Service: Neurosurgery;  Laterality: N/A;   TOTAL ABDOMINAL HYSTERECTOMY  02/1993   HPI:  Patient is an 83 y.o. female with PMH: Parkinson's disease, PSP, osteomyelitis/discitis, peripheral neuropathy, OSA unable to tolerate CPAP, GERD. She presented from SNF to hospital on 09/06/23 for evaluation of generalized weakness with SNF staff reporting increasing confusion/hallucinations and poor PO intake. Daughter reported that SNF staff also were concerned of difficulty swallowing but she thinks that was due to them giving her a whole potassium pill instead of cutting it in half. Patient herself reportedly indicated that she just does not feel like eating or drinking. In ED, MRI brain negative for acute intracranial abnormality, mildly progressive midbrain volume loss may be finding of PSP.    Assessment / Plan / Recommendation  Clinical Impression  Patient presents with clinical s/s of what appears to be an oral dysphagia and is likely at or near baseline swallow function with mildly prolonged mastication and oral transit of solids and liquids. No overt s/s aspiration and hyolaryngeal elevation appeared [facility] per palpation. Patient did exhibit mild belching that started to occur after PO intake. This is consistent with her h/o GERD and was also noticed and documented during 2020 BSE. After reviewing the various consistencies that the hospital offers  (puree, minced, ,mechanical soft, regular) daughter opted to have either her or her sister choose items for patient. (patient has had poor PO intake overall) SLP  discussed recommendations with patient and daughter, specifically advising daughter to select menu items that are easy to chew. SLP will plan to f/u at least once to ensure toleration. SLP Visit Diagnosis: Dysphagia, unspecified (R13.10)    Aspiration Risk  Mild aspiration risk    Diet Recommendation Regular;Dysphagia 3 (Mech soft);Thin liquid     Liquid Administration via: Straw;Cup Medication Administration: Other (Comment) (as tolerated) Compensations: Slow rate;Small sips/bites Postural Changes: Seated upright at 90 degrees;Remain upright for at least 30 minutes after po intake    Other  Recommendations Oral Care Recommendations: Oral care BID;Staff/trained caregiver to provide oral care    Recommendations for follow up therapy are one component of a multi-disciplinary discharge planning process, led by the attending physician.  Recommendations may be updated based on patient status, additional functional criteria and insurance authorization.  Follow up Recommendations Other (comment) (TBD)      Assistance Recommended at Discharge    Functional Status Assessment Patient has had a recent decline in their functional status and demonstrates the ability to make significant improvements in function in a reasonable and predictable amount of time.  Frequency and Duration min 1 x/week  1 week       Prognosis Prognosis for improved oropharyngeal function: Good      Swallow Study   General Date of Onset: 09/06/23 HPI: Patient is an 83 y.o. female with PMH: Parkinson's disease, PSP, osteomyelitis/discitis, peripheral neuropathy, OSA unable to tolerate CPAP, GERD. She presented from SNF to hospital on 09/06/23 for evaluation of generalized weakness with SNF staff reporting increasing confusion/hallucinations and poor PO intake. Daughter reported that SNF staff also were concerned of difficulty swallowing but she thinks that was due to them giving her a whole potassium pill instead of cutting it in half. Patient herself reportedly indicated that she just does not feel like eating or drinking. In ED, MRI brain negative for acute intracranial abnormality, mildly progressive midbrain volume loss may be finding of PSP. Type of Study: Bedside Swallow Evaluation Previous Swallow Assessment: remote BSE in 2020 Diet Prior to this Study:  Regular;Thin liquids (Level 0) Temperature Spikes Noted: No Respiratory Status: Room air History of Recent Intubation: No Behavior/Cognition: Alert;Cooperative;Pleasant mood Oral Cavity Assessment: Within Functional Limits Oral Care Completed by SLP: No Oral Cavity - Dentition: Adequate natural dentition Vision: Functional for self-feeding Self-Feeding Abilities: Able to feed self;Needs set up Patient Positioning: Upright in bed Baseline Vocal Quality: Low vocal intensity Volitional Cough: Weak Volitional Swallow: Unable to elicit    Oral/Motor/Sensory Function Overall Oral Motor/Sensory Function: Generalized oral weakness   Ice Chips     Thin Liquid Thin Liquid: Within functional limits Presentation: Straw    Nectar Thick     Honey Thick     Puree Puree: Not tested   Solid     Solid: Impaired Oral Phase Impairments: Impaired mastication Oral Phase Functional Implications: Prolonged oral transit     Angela Nevin, MA, CCC-SLP Speech Therapy

## 2023-09-07 NOTE — ED Notes (Signed)
 Patient transported to MRI

## 2023-09-07 NOTE — Consult Note (Signed)
 Urology Consult Note   Requesting Attending Physician:  Deanna Artis, DO Service Providing Consult: Urology  Consulting Attending: Dr Dwana Curd    Reason for Consult: Possible left UPJ obstruction  HPI: [name] is seen in consultation for reasons noted above at the request of Deanna Artis, DO for possible left UPJ obstruction.  This is a 83 y.o. female with history of progressive supranuclear palsy, Parkinson's disease, peripheral neuropathy, paroxysmal A-fib on Eliquis, mild bilateral carotid stenosis, hypertension, hyperlipidemia CKD as well as history of urgency and frequency who presents with progressive generalized weakness for the past 2 weeks as well as some neurological symptoms.   History taken from daughter in the room.  Daughter reports that over the past 2 weeks she has had some difficulty with weakness and just jointed movement.  She was concerned that she also had an aspiration event when she had a smoothie.   She denies any nausea however patient has had some burps and spit ups.  No weight loss, fevers or chills.  Temperature was up to 99.7 at her facility.  She also reports some hallucinations.  Per the daughter when she has UTIs she does get hallucinations so she was not sure if this is what was going on with her.  Patient is currently on Eliquis for paroxysmal A-fib.   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   GERD (gastroesophageal reflux disease)    Hyperlipidemia    Hypertension    Lumbar stenosis    OSA (obstructive sleep apnea)    "couldn't handle CPAP; use mouth guard some; not all the time" (01/06/2014)   PAF (paroxysmal atrial fibrillation) (HCC)    Scoliosis    with radiculopathy L2-S1 with prior surgery   Small bowel obstruction (HCC)     versus ileus after last bck surgery   Spondylosis     Past Surgical History:  Past Surgical History:  Procedure Laterality Date   BACK SURGERY     CATARACT EXTRACTION W/ INTRAOCULAR LENS  IMPLANT, BILATERAL  2011   CHOLECYSTECTOMY N/A 07/07/2018   Procedure: LAPAROSCOPIC CHOLECYSTECTOMY;  Surgeon: Harriette Bouillon, MD;  Location: [facility] OR;  Service: General;  Laterality: N/A;   COLONOSCOPY  12/09/2004   LIPOMA EXCISION  1980's   "fatty tumors"   LUMBAR DISC SURGERY  02/2009   "ruptured disc"   NASAL SEPTUM SURGERY  80's   POSTERIOR LUMBAR FUSION  06/2010; 10/2011   "placed screws, rods, spacers both times"   POSTERIOR LUMBAR FUSION 4 LEVEL N/A 12/13/2018   Procedure: THORACIC NINE -LUMBAR THREE POSTERIOR INSTRUMENTATION FUSION;  Surgeon: Jadene Pierini, MD;  Location: [facility];  Service: Neurosurgery;  Laterality: N/A;   REPAIR DURAL / CSF LEAK  02/2009   THORACIC LAMINECTOMY FOR EPIDURAL ABSCESS N/A 09/28/2018   Procedure: THORACIC NINE-THORACIC TEN, THORACIC TEN-THORACIC ELEVEN, THORACIC ELEVEN-THORACIC TWELVE LAMINECTOMIES FOR EPIDURAL ABSCESS;  Surgeon: Jadene Pierini, MD;  Location: [facility];  Service: Neurosurgery;  Laterality: N/A;   TOTAL ABDOMINAL HYSTERECTOMY  02/1993    Medication: Current Facility-Administered Medications  Medication Dose Route Frequency Provider Last Rate Last Admin   0.9 %  sodium chloride infusion   Intravenous Continuous [name] Giovanni, MD 100 mL/hr at 09/07/23 0032 New Bag at 09/07/23  0032   cefTRIAXone (ROCEPHIN) 1 g in sodium chloride 0.9 % 100 mL IVPB  1 g Intravenous Q24H [name] Giovanni, MD   Stopped at 09/06/23 2350   labetalol (NORMODYNE) injection 5 mg  5 mg Intravenous Q2H PRN [name] Giovanni, MD   5 mg at 09/07/23 0120    Allergies: Allergies  Allergen Reactions   Lyrica [Pregabalin] Other (See Comments)    Felt loopy   Zocor [Simvastatin] Other (See Comments)    Myalgias   Relafen [Nabumetone] Rash    Social History: Social  History   Tobacco Use   Smoking status: Never   Smokeless tobacco: Never  Vaping Use   Vaping status: Never Used  Substance Use Topics   Alcohol use: No    Alcohol/week: 0.0 standard drinks of alcohol   Drug use: No    Family History Family History  Problem Relation Age of Onset   Arthritis Mother    Heart attack Father    Hypertension Sister    Lung cancer Sister    Asthma Sister    Allergies Sister     Review of Systems 10 systems were reviewed and are negative except as noted specifically in the HPI.  Objective   Vital signs in last 24 hours: BP (!) 157/64 (BP Location: Left Arm)   Pulse 93   Temp 98.9 F (37.2 C) (Oral)   Resp 18   Ht 5\' 6"  (1.676 m)   Wt 88 kg   SpO2 97%   BMI 31.31 kg/m   Physical Exam General: NAD Pulmonary: Normal work of breathing Cardiovascular: HDS, adequate peripheral perfusion Abdomen: Soft, NTTP, nondistended. GU: Voiding via pure wick, urine is yellow.  No CVA tenderness Extremities: warm and well perfused Neuro: Appropriate, no focal neurological deficits  Most Recent Labs: Lab Results  Component Value Date   WBC 8.0 09/06/2023   HGB 9.6 (L) 09/06/2023   HCT 31.6 (L) 09/06/2023   PLT 237 09/06/2023    Lab Results  Component Value Date   NA 138 09/06/2023   K 4.2 09/06/2023   CL 104 09/06/2023   CO2 22 09/06/2023   BUN 16 09/06/2023   CREATININE 1.25 (H) 09/06/2023   CALCIUM 8.5 (L) 09/06/2023   MG 2.1 09/06/2023   PHOS 3.5 06/06/2020    Lab Results  Component Value Date   INR 1.9 (A) 07/06/2019   APTT 38 (H) 02/01/2014     Urine Culture: @[id number] (laburin,org,[id number],[id number])@   IMAGING: MR Brain W and Wo Contrast Result Date: 09/06/2023 CLINICAL DATA:  Transient ischemic attack EXAM: MRI HEAD WITHOUT AND WITH CONTRAST TECHNIQUE: Multiplanar, multiecho pulse sequences of the brain and surrounding structures were obtained without and with intravenous contrast. CONTRAST:  8mL GADAVIST GADOBUTROL 1 MMOL/ML  IV SOLN COMPARISON:  11/28/2013 FINDINGS: Brain: No acute infarct, mass effect or extra-axial collection. No acute or chronic hemorrhage. There is multifocal hyperintense T2-weighted signal within the white matter. Generalized volume loss. The midline structures are normal. Dilated perivascular space of the left lentiform nucleus. No abnormal contrast enhancement. Since 11/28/2013, there has been mild progressive mid brain volume loss. Vascular: Normal flow voids. Skull and upper cervical spine: Normal calvarium and skull base. Visualized upper cervical spine and soft tissues are normal. Sinuses/Orbits:No paranasal sinus fluid levels or advanced mucosal thickening. No mastoid or middle ear effusion. Normal orbits. IMPRESSION: 1. No acute intracranial abnormality. 2. 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 3. Findings of chronic small vessel ischemia and  volume loss. Electronically Signed   By: Deatra Robinson M.D.   On: 09/06/2023 21:14   CT ABDOMEN PELVIS W CONTRAST Result Date: 09/06/2023 CLINICAL DATA:  Concern for bowel obstruction. EXAM: CT ABDOMEN AND PELVIS WITH CONTRAST TECHNIQUE: Multidetector CT imaging of the abdomen and pelvis was performed using the standard protocol following bolus administration of intravenous contrast. RADIATION DOSE REDUCTION: This exam was performed according to the departmental dose-optimization program which includes automated exposure control, adjustment of the mA and/or kV according to patient size and/or use of iterative reconstruction technique. CONTRAST:  75mL OMNIPAQUE IOHEXOL 350 MG/ML SOLN COMPARISON:  CT abdomen pelvis dated 03/13/2020. FINDINGS: Lower chest: Trace left pleural effusion and left lung base atelectasis. Mild cardiomegaly. Coronary vascular calcification. No intra-abdominal free air or free fluid. Hepatobiliary: Indeterminate 11 mm hypoenhancing lesion in the dome of the liver, new since the prior CT.  Metastatic disease is not excluded. No biliary dilatation. Cholecystectomy. Pancreas: Unremarkable. No pancreatic ductal dilatation or surrounding inflammatory changes. Spleen: Normal in size without focal abnormality. Adrenals/Urinary Tract: The adrenal glands unremarkable. There is an ill-defined high attenuating density in the left renal collecting system and pelvis measuring approximately 5.5 x 2.9 cm (coronal 44/6) which may represent blood clot or a urothelial mass. Nonobstructing left renal inferior pole calculi measuring up to 15 mm. There is severe left hydronephrosis with transition at the ureteropelvic junction. There is left renal perinephric and periureteric stranding with loss of fat plane between the kidney and left psoas muscle. 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 Urology consult and further evaluation with MRI without and with contrast or direct visualization with scope is recommended. Several small left renal cysts noted. There is no hydronephrosis on the right. There is trabeculated appearance of the bladder wall likely related to chronic bladder dysfunction. Small amount of air within the bladder may have been introduced by recent instrumentation. Stomach/Bowel: There is sigmoid diverticulosis. Moderate stool throughout the colon. There is no bowel obstruction or active inflammation. The appendix is normal. Vascular/Lymphatic: Moderate aortoiliac atherosclerotic disease. The IVC is unremarkable. No portal venous gas. There is no adenopathy. Reproductive: Hysterectomy.  No suspicious adnexal masses. Other: None Musculoskeletal: Osteopenia with degenerative changes of the spine. Extensive posterior spinal fusion hardware. No acute osseous pathology IMPRESSION: 1. Severe left hydronephrosis with transition at the ureteropelvic junction. 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 Urology consult and further evaluation with  MRI without and with contrast or direct visualization with scope is recommended. 2. Nonobstructing left renal inferior pole calculi. Left perinephric stranding suspicious for superimposed infection. Correlation with urinalysis recommended. 3. Sigmoid diverticulosis. No bowel obstruction. Normal appendix. 4. Indeterminate 11 mm hypoenhancing lesion in the dome of the liver, new since the prior CT. Metastatic disease is not excluded. Electronically Signed   By: Elgie Collard M.D.   On: 09/06/2023 20:06   DG Chest 2 View Result Date: 09/06/2023 CLINICAL DATA:  Cough, fatigue and weakness. EXAM: CHEST - 2 VIEW COMPARISON:  Chest radiograph dated 06/05/2020. FINDINGS: Shallow inspiration. Left lung base atelectasis versus infiltrate. No large pleural effusion. No pneumothorax. Stable cardiac silhouette. Atherosclerotic calcification of the aorta. No acute osseous pathology. Posterior spinal fusion hardware. IMPRESSION: Left lung base atelectasis versus infiltrate. Electronically Signed   By: Elgie Collard M.D.   On: 09/06/2023 17:25    ------  Assessment:  83 y.o. female with who presents with generalized weakness, possible aspiration event now with a CT  scan concerning for left hydronephrosis to the level of the UPJ.  On arrival to the ED, patient was afebrile and hemodynamically stable.  Labs without evidence of leukocytosis.  Creatinine was 1.21 from a baseline of 0.8-1.  Repeat creatinine 1.25.  Urinalysis was completely negative for infection or blood.  Blood cultures and urine cultures pending.   CT scan with some hydronephrosis however in 2021, patient had multiple intrarenal cyst.  Unclear if renal cysts are just larger or if there is true hydronephrosis on that left side.  At this time given patient's hemodynamic stability would recommend obtaining a CT urogram to better delineate what is going on with her left kidney.  Recommendations: -No acute urologic intervention -Trend  creatinine -Recommend obtaining a CT urogram to evaluate for hydronephrosis on the left side as well as better delineate the ureter to identify any pathology in the ureter. -Follow-up urine culture and blood cultures -Urology will continue to follow   Thank you for this consult. Please contact the urology consult pager with any further questions/concerns.

## 2023-09-07 NOTE — Progress Notes (Signed)
 PT Cancellation Note  Patient Details Name: [name] MRN: [id number] DOB: 08-Mar-1941   Cancelled Treatment:    Reason Eval/Treat Not Completed: Fatigue limiting ability to participate. Daughter reports pt is just now getting some rest after not getting any sleep last night. Will check back tomorrow.    Angelina Ok [place] 09/07/2023, 2:18 PM Skip Mayer PT Acute [employer] [phone]

## 2023-09-07 NOTE — ED Notes (Signed)
 ED TO INPATIENT HANDOFF REPORT  ED Nurse Name and Phone #: Marcie Bal RN [phone]  S Name/Age/Gender [name] 83 y.o. female Room/Bed: 004C/004C  Code Status   Code Status: Limited: Do not attempt resuscitation (DNR) -DNR-LIMITED -Do Not Intubate/DNI   Home/SNF/Other SNF [facility] Patient oriented to: self, place, time, and situation Is this baseline? Yes   Triage Complete: Triage complete  Chief Complaint Hydronephrosis [N13.30]  Triage Note Gen weakness and rapid decline x2 weeks. From SNF [facility]   Allergies Allergies  Allergen Reactions   Lyrica [Pregabalin] Other (See Comments)    Felt loopy   Zocor [Simvastatin] Other (See Comments)    Myalgias   Relafen [Nabumetone] Rash    Level of Care/Admitting Diagnosis ED Disposition     ED Disposition  Admit   Condition  --   Comment  Hospital Area: [facility] [[id number]]  Level of Care: Telemetry Medical [104]  May place patient in observation at [facility] or [facility] if equivalent level of care is available:: Yes  Covid Evaluation: Asymptomatic - no recent exposure (last 10 days) testing not required  Diagnosis: Hydronephrosis [591.ICD-9-CM]  Admitting Physician: [name] Giovanni [[id number]]  Attending Physician: [name] Giovanni [[id number]]          B Medical/Surgery History 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   GERD (gastroesophageal reflux disease)    Hyperlipidemia    Hypertension    Lumbar stenosis    OSA (obstructive sleep apnea)    "couldn't handle CPAP; use mouth guard some; not all the time" (01/06/2014)   PAF (paroxysmal atrial fibrillation) (HCC)    Scoliosis    with radiculopathy L2-S1 with prior surgery   Small bowel obstruction (HCC)    versus ileus after last bck surgery    Spondylosis    Past Surgical History:  Procedure Laterality Date   BACK SURGERY     CATARACT EXTRACTION W/ INTRAOCULAR LENS  IMPLANT, BILATERAL  2011   CHOLECYSTECTOMY N/A 07/07/2018   Procedure: LAPAROSCOPIC CHOLECYSTECTOMY;  Surgeon: Harriette Bouillon, MD;  Location: [facility] OR;  Service: General;  Laterality: N/A;   COLONOSCOPY  12/09/2004   LIPOMA EXCISION  1980's   "fatty tumors"   LUMBAR DISC SURGERY  02/2009   "ruptured disc"   NASAL SEPTUM SURGERY  80's   POSTERIOR LUMBAR FUSION  06/2010; 10/2011   "placed screws, rods, spacers both times"   POSTERIOR LUMBAR FUSION 4 LEVEL N/A 12/13/2018   Procedure: THORACIC NINE -LUMBAR THREE POSTERIOR INSTRUMENTATION FUSION;  Surgeon: Jadene Pierini, MD;  Location: [facility] OR;  Service: Neurosurgery;  Laterality: N/A;   REPAIR DURAL / CSF LEAK  02/2009   THORACIC LAMINECTOMY FOR EPIDURAL ABSCESS N/A 09/28/2018   Procedure: THORACIC NINE-THORACIC TEN, THORACIC TEN-THORACIC ELEVEN, THORACIC ELEVEN-THORACIC TWELVE LAMINECTOMIES FOR EPIDURAL ABSCESS;  Surgeon: Jadene Pierini, MD;  Location: [facility] OR;  Service: Neurosurgery;  Laterality: N/A;   TOTAL ABDOMINAL HYSTERECTOMY  02/1993     A IV Location/Drains/Wounds Patient Lines/Drains/Airways Status     Active Line/Drains/Airways     Name Placement date Placement time Site Days   Peripheral IV 09/06/23 20 G Posterior;Right Hand 09/06/23  1328  Hand  1   Incision - 4 Ports Abdomen Umbilicus Mid;Upper Right;Upper;Lateral Right;Lower;Lateral 07/07/18  1019  --  1888            Intake/Output Last 24 hours No intake or output data in the 24 hours ending 09/07/23 0156  Labs/Imaging Results for orders placed or performed during the hospital encounter of 09/06/23 (from the past 48 hours)  CBC with Differential     Status: Abnormal   Collection Time: 09/06/23 12:20 PM  Result Value Ref Range   WBC 7.9 4.0 - 10.5 K/uL   RBC 3.78 (L) 3.87 - 5.11 MIL/uL   Hemoglobin 10.6 (L) 12.0 - 15.0 g/dL   HCT 16.1 (L)  09.6 - 46.0 %   MCV 89.2 80.0 - 100.0 fL   MCH 28.0 26.0 - 34.0 pg   MCHC 31.5 30.0 - 36.0 g/dL   RDW 04.5 40.9 - 81.1 %   Platelets 234 150 - 400 K/uL   nRBC 0.0 0.0 - 0.2 %   Neutrophils Relative % 77 %   Neutro Abs 6.1 1.7 - 7.7 K/uL   Lymphocytes Relative 9 %   Lymphs Abs 0.7 0.7 - 4.0 K/uL   Monocytes Relative 11 %   Monocytes Absolute 0.9 0.1 - 1.0 K/uL   Eosinophils Relative 2 %   Eosinophils Absolute 0.1 0.0 - 0.5 K/uL   Basophils Relative 0 %   Basophils Absolute 0.0 0.0 - 0.1 K/uL   Immature Granulocytes 1 %   Abs Immature Granulocytes 0.06 0.00 - 0.07 K/uL    Comment: Performed at [facility], [address], [place], [place] [address]  Comprehensive metabolic panel     Status: Abnormal   Collection Time: 09/06/23 12:20 PM  Result Value Ref Range   Sodium 137 135 - 145 mmol/L   Potassium 4.4 3.5 - 5.1 mmol/L   Chloride 104 98 - 111 mmol/L   CO2 23 22 - 32 mmol/L   Glucose, Bld 117 (H) 70 - 99 mg/dL    Comment: Glucose reference range applies only to samples taken after fasting for at least 8 hours.   BUN 19 8 - 23 mg/dL   Creatinine, Ser 2.95 (H) 0.44 - 1.00 mg/dL   Calcium 8.5 (L) 8.9 - 10.3 mg/dL   Total Protein 5.9 (L) 6.5 - 8.1 g/dL   Albumin 2.5 (L) 3.5 - 5.0 g/dL   AST 60 (H) 15 - 41 U/L   ALT 10 0 - 44 U/L   Alkaline Phosphatase 97 38 - 126 U/L   Total Bilirubin 0.6 0.0 - 1.2 mg/dL   GFR, Estimated 45 (L) >60 mL/min    Comment: (NOTE) Calculated using the CKD-EPI Creatinine Equation (2021)    Anion gap 10 5 - 15    Comment: Performed at [facility], [address], [place], [place] [address]  CK     Status: None   Collection Time: 09/06/23 12:20 PM  Result Value Ref Range   Total CK 53 38 - 234 U/L    Comment: Performed at [facility], [address], [place], [place] [address]  Magnesium     Status: None   Collection Time: 09/06/23 12:20 PM  Result Value Ref Range   Magnesium 2.1 1.7 - 2.4 mg/dL    Comment: Performed at  [facility], [address], [place], [place] [address]  Urinalysis, Routine w reflex microscopic -Urine, Catheterized     Status: None   Collection Time: 09/06/23  1:10 PM  Result Value Ref Range   Color, Urine YELLOW YELLOW   APPearance  CLEAR CLEAR   Specific Gravity, Urine 1.020 1.005 - 1.030   pH 6.0 5.0 - 8.0   Glucose, UA NEGATIVE NEGATIVE mg/dL   Hgb urine dipstick NEGATIVE NEGATIVE   Bilirubin Urine NEGATIVE NEGATIVE   Ketones, ur NEGATIVE NEGATIVE mg/dL   Protein, ur NEGATIVE NEGATIVE mg/dL   Nitrite NEGATIVE NEGATIVE   Leukocytes,Ua NEGATIVE NEGATIVE    Comment: Microscopic not done on urines with negative protein, blood, leukocytes, nitrite, or glucose < 500 mg/dL. Performed at [facility], [address], [place], [place] [address]   Resp panel by RT-PCR (RSV, Flu A&B, Covid) Anterior Nasal Swab     Status: None   Collection Time: 09/06/23  2:42 PM   Specimen: Anterior Nasal Swab  Result Value Ref Range   SARS Coronavirus 2 by RT PCR NEGATIVE NEGATIVE   Influenza A by PCR NEGATIVE NEGATIVE   Influenza B by PCR NEGATIVE NEGATIVE    Comment: (NOTE) The Xpert Xpress SARS-CoV-2/FLU/RSV plus assay is intended as an aid in the diagnosis of influenza from Nasopharyngeal swab specimens and should not be used as a sole basis for treatment. Nasal washings and aspirates are unacceptable for Xpert Xpress SARS-CoV-2/FLU/RSV testing.  Fact Sheet for Patients: BloggerCourse.com  Fact Sheet for Healthcare Providers: SeriousBroker.it  This test is not yet approved or cleared by the [place] FDA and has been authorized for detection and/or diagnosis of SARS-CoV-2 by FDA under an Emergency Use Authorization (EUA). This EUA will remain in effect (meaning this test can be used) for the duration of the COVID-19 declaration under Section 564(b)(1) of the Act, 21 U.S.C. section 360bbb-3(b)(1), unless the authorization  is terminated or revoked.     Resp Syncytial Virus by PCR NEGATIVE NEGATIVE    Comment: (NOTE) Fact Sheet for Patients: BloggerCourse.com  Fact Sheet for Healthcare Providers: SeriousBroker.it  This test is not yet approved or cleared by the [place] FDA and has been authorized for detection and/or diagnosis of SARS-CoV-2 by FDA under an Emergency Use Authorization (EUA). This EUA will remain in effect (meaning this test can be used) for the duration of the COVID-19 declaration under Section 564(b)(1) of the Act, 21 U.S.C. section 360bbb-3(b)(1), unless the authorization is terminated or revoked.  Performed at [facility], [address], [place], [place] [address]   Procalcitonin     Status: None   Collection Time: 09/06/23  8:30 PM  Result Value Ref Range   Procalcitonin 0.13 ng/mL    Comment:        Interpretation: PCT (Procalcitonin) <= 0.5 ng/mL: Systemic infection (sepsis) is not likely. Local bacterial infection is possible. (NOTE)       Sepsis PCT Algorithm           Lower Respiratory Tract                                      Infection PCT Algorithm    ----------------------------     ----------------------------         PCT < 0.25 ng/mL                PCT < 0.10 ng/mL          Strongly encourage             Strongly discourage   discontinuation of antibiotics    initiation of antibiotics    ----------------------------     -----------------------------  PCT 0.25 - 0.50 ng/mL            PCT 0.10 - 0.25 ng/mL               OR       >80% decrease in PCT            Discourage initiation of                                            antibiotics      Encourage discontinuation           of antibiotics    ----------------------------     -----------------------------         PCT >= 0.50 ng/mL              PCT 0.26 - 0.50 ng/mL               AND        <80% decrease in PCT             Encourage  initiation of                                             antibiotics       Encourage continuation           of antibiotics    ----------------------------     -----------------------------        PCT >= 0.50 ng/mL                  PCT > 0.50 ng/mL               AND         increase in PCT                  Strongly encourage                                      initiation of antibiotics    Strongly encourage escalation           of antibiotics                                     -----------------------------                                           PCT <= 0.25 ng/mL                                                 OR                                        > 80% decrease in PCT  Discontinue / Do not initiate                                             antibiotics  Performed at [facility], [address] [place], [place] [address]   CBC     Status: Abnormal   Collection Time: 09/06/23 11:59 PM  Result Value Ref Range   WBC 8.0 4.0 - 10.5 K/uL   RBC 3.43 (L) 3.87 - 5.11 MIL/uL   Hemoglobin 9.6 (L) 12.0 - 15.0 g/dL   HCT 41.3 (L) 24.4 - 01.0 %   MCV 92.1 80.0 - 100.0 fL   MCH 28.0 26.0 - 34.0 pg   MCHC 30.4 30.0 - 36.0 g/dL   RDW 27.2 53.6 - 64.4 %   Platelets 237 150 - 400 K/uL   nRBC 0.0 0.0 - 0.2 %    Comment: Performed at [facility], [address], [place], [place] [address]  Comprehensive metabolic panel     Status: Abnormal   Collection Time: 09/06/23 11:59 PM  Result Value Ref Range   Sodium 138 135 - 145 mmol/L   Potassium 4.2 3.5 - 5.1 mmol/L   Chloride 104 98 - 111 mmol/L   CO2 22 22 - 32 mmol/L   Glucose, Bld 94 70 - 99 mg/dL    Comment: Glucose reference range applies only to samples taken after fasting for at least 8 hours.   BUN 16 8 - 23 mg/dL   Creatinine, Ser 2.59 (H) 0.44 - 1.00 mg/dL   Calcium 8.5 (L) 8.9 - 10.3 mg/dL   Total Protein 5.7 (L) 6.5 - 8.1 g/dL   Albumin 2.4 (L) 3.5 - 5.0 g/dL   AST 49  (H) 15 - 41 U/L   ALT 27 0 - 44 U/L   Alkaline Phosphatase 94 38 - 126 U/L   Total Bilirubin 0.6 0.0 - 1.2 mg/dL   GFR, Estimated 44 (L) >60 mL/min    Comment: (NOTE) Calculated using the CKD-EPI Creatinine Equation (2021)    Anion gap 12 5 - 15    Comment: Performed at [facility], [address] [place], [place] [address]  Vitamin B12     Status: None   Collection Time: 09/06/23 11:59 PM  Result Value Ref Range   Vitamin B-12 610 180 - 914 pg/mL    Comment: (NOTE) This assay is not validated for testing neonatal or myeloproliferative syndrome specimens for Vitamin B12 levels. Performed at [facility], [address], [place], [place] [address]   Folate     Status: None   Collection Time: 09/06/23 11:59 PM  Result Value Ref Range   Folate >40.0 >5.9 ng/mL    Comment: Performed at [facility], [address], [place], [place] [address]  Iron and TIBC     Status: Abnormal   Collection Time: 09/06/23 11:59 PM  Result Value Ref Range   Iron 37 28 - 170 ug/dL   TIBC 841 (L) 660 - 630 ug/dL   Saturation Ratios 19 10.4 - 31.8 %   UIBC 159 ug/dL    Comment: Performed at [facility], [address], [place], [place] [address]  Ferritin     Status: Abnormal   Collection Time: 09/06/23 11:59 PM  Result Value Ref Range   Ferritin 328 (H) 11 -  307 ng/mL    Comment: Performed at [facility], [address] [place], [place] [address]  Reticulocytes     Status: Abnormal   Collection Time: 09/06/23 11:59 PM  Result Value Ref Range   Retic Ct Pct 0.9 0.4 - 3.1 %   RBC. 3.73 (L) 3.87 - 5.11 MIL/uL   Retic Count, Absolute 31.7 19.0 - 186.0 K/uL   Immature Retic Fract 18.9 (H) 2.3 - 15.9 %    Comment: Performed at [facility], [address], [name], [place] [address]  Hepatitis panel, acute     Status: None   Collection Time: 09/06/23 11:59 PM  Result Value Ref Range   Hepatitis B Surface Ag NON REACTIVE NON REACTIVE   HCV Ab NON REACTIVE  NON REACTIVE    Comment: (NOTE) Nonreactive HCV antibody screen is consistent with no HCV infections,  unless recent infection is suspected or other evidence exists to indicate HCV infection.     Hep A IgM NON REACTIVE NON REACTIVE   Hep B C IgM NON REACTIVE NON REACTIVE    Comment: Performed at [facility], [address] [place], [place] [address]   MR Brain W and Wo Contrast Result Date: 09/06/2023 CLINICAL DATA:  Transient ischemic attack EXAM: MRI HEAD WITHOUT AND WITH CONTRAST TECHNIQUE: Multiplanar, multiecho pulse sequences of the brain and surrounding structures were obtained without and with intravenous contrast. CONTRAST:  8mL GADAVIST GADOBUTROL 1 MMOL/ML IV SOLN COMPARISON:  11/28/2013 FINDINGS: Brain: No acute infarct, mass effect or extra-axial collection. No acute or chronic hemorrhage. There is multifocal hyperintense T2-weighted signal within the white matter. Generalized volume loss. The midline structures are normal. Dilated perivascular space of the left lentiform nucleus. No abnormal contrast enhancement. Since 11/28/2013, there has been mild progressive mid brain volume loss. Vascular: Normal flow voids. Skull and upper cervical spine: Normal calvarium and skull base. Visualized upper cervical spine and soft tissues are normal. Sinuses/Orbits:No paranasal sinus fluid levels or advanced mucosal thickening. No mastoid or middle ear effusion. Normal orbits. IMPRESSION: 1. No acute intracranial abnormality. 2. 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 3. Findings of chronic small vessel ischemia and volume loss. Electronically Signed   By: Deatra Robinson M.D.   On: 09/06/2023 21:14   CT ABDOMEN PELVIS W CONTRAST Result Date: 09/06/2023 CLINICAL DATA:  Concern for bowel obstruction. EXAM: CT ABDOMEN AND PELVIS WITH CONTRAST TECHNIQUE: Multidetector CT imaging of the abdomen and pelvis was performed using the standard protocol  following bolus administration of intravenous contrast. RADIATION DOSE REDUCTION: This exam was performed according to the departmental dose-optimization program which includes automated exposure control, adjustment of the mA and/or kV according to patient size and/or use of iterative reconstruction technique. CONTRAST:  75mL OMNIPAQUE IOHEXOL 350 MG/ML SOLN COMPARISON:  CT abdomen pelvis dated 03/13/2020. FINDINGS: Lower chest: Trace left pleural effusion and left lung base atelectasis. Mild cardiomegaly. Coronary vascular calcification. No intra-abdominal free air or free fluid. Hepatobiliary: Indeterminate 11 mm hypoenhancing lesion in the dome of the liver, new since the prior CT. Metastatic disease is not excluded. No biliary dilatation. Cholecystectomy. Pancreas: Unremarkable. No pancreatic ductal dilatation or surrounding inflammatory changes. Spleen: Normal in size without focal abnormality. Adrenals/Urinary Tract: The adrenal glands unremarkable. There is an ill-defined high attenuating density in the left renal collecting system and pelvis measuring approximately 5.5 x 2.9 cm (coronal 44/6) which may represent blood clot or a urothelial mass. Nonobstructing left  renal inferior pole calculi measuring up to 15 mm. There is severe left hydronephrosis with transition at the ureteropelvic junction. There is left renal perinephric and periureteric stranding with loss of fat plane between the kidney and left psoas muscle. 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 Urology consult and further evaluation with MRI without and with contrast or direct visualization with scope is recommended. Several small left renal cysts noted. There is no hydronephrosis on the right. There is trabeculated appearance of the bladder wall likely related to chronic bladder dysfunction. Small amount of air within the bladder may have been introduced by recent instrumentation. Stomach/Bowel: There is  sigmoid diverticulosis. Moderate stool throughout the colon. There is no bowel obstruction or active inflammation. The appendix is normal. Vascular/Lymphatic: Moderate aortoiliac atherosclerotic disease. The IVC is unremarkable. No portal venous gas. There is no adenopathy. Reproductive: Hysterectomy.  No suspicious adnexal masses. Other: None Musculoskeletal: Osteopenia with degenerative changes of the spine. Extensive posterior spinal fusion hardware. No acute osseous pathology IMPRESSION: 1. Severe left hydronephrosis with transition at the ureteropelvic junction. 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 Urology consult and further evaluation with MRI without and with contrast or direct visualization with scope is recommended. 2. Nonobstructing left renal inferior pole calculi. Left perinephric stranding suspicious for superimposed infection. Correlation with urinalysis recommended. 3. Sigmoid diverticulosis. No bowel obstruction. Normal appendix. 4. Indeterminate 11 mm hypoenhancing lesion in the dome of the liver, new since the prior CT. Metastatic disease is not excluded. Electronically Signed   By: Elgie Collard M.D.   On: 09/06/2023 20:06   DG Chest 2 View Result Date: 09/06/2023 CLINICAL DATA:  Cough, fatigue and weakness. EXAM: CHEST - 2 VIEW COMPARISON:  Chest radiograph dated 06/05/2020. FINDINGS: Shallow inspiration. Left lung base atelectasis versus infiltrate. No large pleural effusion. No pneumothorax. Stable cardiac silhouette. Atherosclerotic calcification of the aorta. No acute osseous pathology. Posterior spinal fusion hardware. IMPRESSION: Left lung base atelectasis versus infiltrate. Electronically Signed   By: Elgie Collard M.D.   On: 09/06/2023 17:25    Pending Labs Unresulted Labs (From admission, onward)     Start     Ordered   09/06/23 2234  Culture, blood (Routine X 2) w Reflex to ID Panel  BLOOD CULTURE X 2,   R      09/06/23 2233    09/06/23 1526  Urine Culture  Add-on,   AD       Question:  Indication  Answer:  Altered mental status (if no other cause identified)   09/06/23 1525            Vitals/Pain Today's Vitals   09/06/23 2330 09/07/23 0030 09/07/23 0035 09/07/23 0115  BP: (!) 181/80 (!) 160/72  (!) 177/76  Pulse: 98 95  96  Resp: 16 20    Temp:      TempSrc:      SpO2: 94% 98%  94%  Weight:      Height:      PainSc:   6      Isolation Precautions No active isolations  Medications Medications  cefTRIAXone (ROCEPHIN) 1 g in sodium chloride 0.9 % 100 mL IVPB (0 g Intravenous Stopped 09/06/23 2350)  0.9 %  sodium chloride infusion ( Intravenous New Bag/Given 09/07/23 0032)  labetalol (NORMODYNE) injection 5 mg (5 mg Intravenous Given 09/07/23 0120)  gadobutrol (GADAVIST) 1 MMOL/ML injection 8 mL (8 mLs Intravenous Contrast Given 09/06/23 1837)  iohexol (  OMNIPAQUE) 350 MG/ML injection 75 mL (75 mLs Intravenous Contrast Given 09/06/23 1947)  lactated ringers bolus 1,000 mL (0 mLs Intravenous Stopped 09/06/23 2256)  acetaminophen (TYLENOL) suppository 650 mg (650 mg Rectal Given 09/07/23 0035)    Mobility Weakness     Focused Assessments Neuro Assessment Handoff:  Swallow screen pass?  NPO at this time         Neuro Assessment: Within Defined Limits Neuro Checks:      Has TPA been given? No If patient is a Neuro Trauma and patient is going to OR before floor call report to 4N Charge nurse: [phone] or [phone]   R Recommendations: See Admitting Provider Note  Report given to:   Additional Notes:

## 2023-09-08 DIAGNOSIS — N1330 Unspecified hydronephrosis: Secondary | ICD-10-CM | POA: Diagnosis not present

## 2023-09-08 LAB — CBC
HCT: 33 % — ABNORMAL LOW (ref 36.0–46.0)
Hemoglobin: 10.7 g/dL — ABNORMAL LOW (ref 12.0–15.0)
MCH: 27.9 pg (ref 26.0–34.0)
MCHC: 32.4 g/dL (ref 30.0–36.0)
MCV: 86.2 fL (ref 80.0–100.0)
Platelets: 295 10*3/uL (ref 150–400)
RBC: 3.83 MIL/uL — ABNORMAL LOW (ref 3.87–5.11)
RDW: 14.9 % (ref 11.5–15.5)
WBC: 7 10*3/uL (ref 4.0–10.5)
nRBC: 0 % (ref 0.0–0.2)

## 2023-09-08 LAB — BASIC METABOLIC PANEL WITH GFR
Anion gap: 10 (ref 5–15)
BUN: 13 mg/dL (ref 8–23)
CO2: 23 mmol/L (ref 22–32)
Calcium: 8.9 mg/dL (ref 8.9–10.3)
Chloride: 105 mmol/L (ref 98–111)
Creatinine, Ser: 1.21 mg/dL — ABNORMAL HIGH (ref 0.44–1.00)
GFR, Estimated: 45 mL/min — ABNORMAL LOW (ref >60)
Glucose, Bld: 90 mg/dL (ref 70–99)
Potassium: 4 mmol/L (ref 3.5–5.1)
Sodium: 138 mmol/L (ref 135–145)

## 2023-09-08 LAB — MAGNESIUM: Magnesium: 2 mg/dL (ref 1.7–2.4)

## 2023-09-08 MED ORDER — GABAPENTIN 300 MG PO CAPS
600.0000 mg | ORAL_CAPSULE | Freq: Two times a day (BID) | ORAL | Status: DC
  Administered 2023-09-08 – 2023-09-09 (×2): 600 mg via ORAL
  Filled 2023-09-08 (×2): qty 2

## 2023-09-08 MED ORDER — FUROSEMIDE 20 MG PO TABS
20.0000 mg | ORAL_TABLET | Freq: Every day | ORAL | Status: DC
  Administered 2023-09-08 – 2023-09-09 (×2): 20 mg via ORAL
  Filled 2023-09-08 (×2): qty 1

## 2023-09-08 NOTE — Evaluation (Signed)
 Physical Therapy Evaluation Patient Details Name: [name] MRN: [id number] DOB: Jul 09, 1940 Today's Date: 09/08/2023  History of Present Illness  83 year old F admitted to [facility] 09/06/23 from [facility]- LTC for progressive weakness x 2 weeks. CT abdomen pelvis noting severe left hydronephrosis with obstruction of left UPJ. PMHx: progressive supranuclear palsy, multiple back surgeries, CHF, HTN, Parkinson's, Afib.  Clinical Impression  Pt pleasant with flat affect, decreased clarity of speech. Pt and daughter report working with therapy at [facility] and walking 1x/week and getting up daily with staff assist. Pt with posterior bias and need for assist with all transitional movements but able to rise to standing from bed and chair, performs well with use of Stedy and encouraged its use with staff for OOB daily. Will trial acute therapy to maximize strength and function prior to return to SNF.        If plan is discharge home, recommend the following: A lot of help with walking and/or transfers;A lot of help with bathing/dressing/bathroom;Assistance with feeding;Assistance with cooking/housework;Assist for transportation   Can travel by private vehicle   No    Equipment Recommendations None recommended by PT  Recommendations for Other Services       Functional Status Assessment Patient has had a recent decline in their functional status and/or demonstrates limited ability to make significant improvements in function in a reasonable and predictable amount of time     Precautions / Restrictions Precautions Precautions: Fall;Other (comment) Recall of Precautions/Restrictions: Impaired Precaution/Restrictions Comments: hx of L foot drop with L AFO      Mobility  Bed Mobility Overal bed mobility: Needs Assistance Bed Mobility: Rolling, Sidelying to Sit Rolling: Mod assist, +2 for physical assistance, +2 for safety/equipment Sidelying to sit: Max assist, +2 for physical assistance, +2  for safety/equipment, Used rails       General bed mobility comments: cues for LUE to bedrail to assist with rolling and pushing self up, use of bed pad to roll and scoot to EOB with heavy assist to lift trunk and scoot hips fully to EOB    Transfers Overall transfer level: Needs assistance   Transfers: Sit to/from Stand, Bed to chair/wheelchair/BSC Sit to Stand: Mod assist, +2 physical assistance, +2 safety/equipment, Via lift equipment   Step pivot transfers: Max assist, +2 physical assistance, +2 safety/equipment       General transfer comment: Mod A x 2 to stand with pt wrapping UE around therapists' UE from EOB. Physical assist to step and position both feet to pivot to chair. Trial of Stedy from recliner with assist for trunk flexion to reach Stedy bars but able to pull up with Mod A x 2    Ambulation/Gait               General Gait Details: unable this date  Stairs            Wheelchair Mobility     Tilt Bed    Modified Rankin (Stroke Patients Only)       Balance Overall balance assessment: Needs assistance Sitting-balance support: No upper extremity supported, Feet supported, Bilateral upper extremity supported Sitting balance-Leahy Scale: Poor Sitting balance - Comments: min -mod assist to maintain foot contact with floor and support trunk due posterior bias with mod cues   Standing balance support: Bilateral upper extremity supported, During functional activity Standing balance-Leahy Scale: Poor Standing balance comment: UB support and physical assist  Pertinent Vitals/Pain Pain Assessment Pain Assessment: No/denies pain    Home Living Family/patient expects to be discharged to:: Skilled nursing facility                 Home Equipment: Agricultural consultant (2 wheels);Rollator (4 wheels);Wheelchair - manual Additional Comments: [facility] x 2+ years    Prior Function Prior Level of Function : Needs  assist       Physical Assist : Mobility (physical);ADLs (physical) Mobility (physical): Bed mobility;Transfers;Gait ADLs (physical): Bathing;Dressing;Toileting;IADLs Mobility Comments: Per daughter and pt she ambulates with staff assistance 1x/wk (44ft). ADLs Comments: Staff wheels pt to the bathroom in w/c where pt can pull up on bar and transfer to commode with CGA/min assist. pt reports being assisted with bathing and dressing out of the bed (EOB vs in wheelchair)     Extremity/Trunk Assessment   Upper Extremity Assessment Upper Extremity Assessment: Defer to OT evaluation LUE Deficits / Details: slightly weaker than RUE, some stiffness with elbow and shoulder    Lower Extremity Assessment Lower Extremity Assessment: LLE deficits/detail LLE Deficits / Details: foot drop with inversion at baseline, AFO, grossly 2/5 strength    Cervical / Trunk Assessment Cervical / Trunk Assessment: Normal  Communication   Communication Communication: Impaired Factors Affecting Communication: Difficulty expressing self    Cognition Arousal: Alert Behavior During Therapy: Flat affect   PT - Cognitive impairments: History of cognitive impairments, Problem solving                         Following commands: Impaired Following commands impaired: Follows one step commands with increased time, Follows multi-step commands inconsistently     Cueing Cueing Techniques: Verbal cues, Gestural cues, Tactile cues, Visual cues     General Comments General comments (skin integrity, edema, etc.): Family present during session, inquired about Stedy and feel this equipment would be helpful for pt at [facility]    Exercises     Assessment/Plan    PT Assessment Patient needs continued PT services  PT Problem List Decreased strength;Decreased activity tolerance;Decreased balance;Decreased mobility;Decreased knowledge of use of DME       PT Treatment Interventions DME instruction;Gait  training;Functional mobility training;Therapeutic activities;Therapeutic exercise;Patient/family education;Balance training    PT Goals (Current goals can be found in the Care Plan section)  Acute Rehab PT Goals Patient Stated Goal: return to walking PT Goal Formulation: With patient/family Time For Goal Achievement: 09/22/23 Potential to Achieve Goals: Fair    Frequency Min 1X/week     Co-evaluation   Reason for Co-Treatment: For patient/therapist safety;To address functional/ADL transfers   OT goals addressed during session: ADL's and self-care;Proper use of Adaptive equipment and DME       AM-PAC PT "6 Clicks" Mobility  Outcome Measure Help needed turning from your back to your side while in a flat bed without using bedrails?: Total Help needed moving from lying on your back to sitting on the side of a flat bed without using bedrails?: Total Help needed moving to and from a bed to a chair (including a wheelchair)?: Total Help needed standing up from a chair using your arms (e.g., wheelchair or bedside chair)?: Total Help needed to walk in hospital room?: Total Help needed climbing 3-5 steps with a railing? : Total 6 Click Score: 6    End of Session Equipment Utilized During Treatment: Gait belt Activity Tolerance: Patient tolerated treatment well Patient left: in chair;with call bell/phone within reach;with chair alarm set;with family/visitor present Nurse  Communication: Mobility status;Need for lift equipment PT Visit Diagnosis: Other abnormalities of gait and mobility (R26.89);Difficulty in walking, not elsewhere classified (R26.2);Muscle weakness (generalized) (M62.81)    Time: 7846-9629 PT Time Calculation (min) (ACUTE ONLY): 22 min   Charges:   PT Evaluation $PT Eval Moderate Complexity: 1 Mod   PT General Charges $$ ACUTE PT VISIT: 1 Visit         Merryl Hacker, PT Acute Rehabilitation Services Office: [phone]   Jing Howatt B Loni Abdon 09/08/2023, 10:09 AM

## 2023-09-08 NOTE — Progress Notes (Signed)
 Pt confused and not responsive, but working to get up to chair with PT/OT today. MRI read not yet available. Clear yellow urine through purewick. Will check back in with her when we have more information. Case and plan reviewed with her daughter at bedside.   Elmon Kirschner, NP [facility] Pager: [phone]  09/08/2023, 11:01 AM

## 2023-09-08 NOTE — Evaluation (Signed)
 Occupational Therapy Evaluation Patient Details Name: [name] MRN: [id number] DOB: 1940/09/03 Today's Date: 09/08/2023   History of Present Illness   83 year old F admitted to [facility] 09/06/23 from [facility]- LTC for progressive weakness x 2 weeks. CT abdomen pelvis noting severe left hydronephrosis with obstruction of left UPJ. PMHx: progressive supranuclear palsy, multiple back surgeries, CHF, HTN, Parkinson's, Afib.     Clinical Impressions PTA, pt is a resident at [facility], typically able to transfer with limited assist and ambulating short distances 1x/wk at facility. Pt receives assist for ADLs. Pt presents now with deficits in strength, cognition and balance. Pt requires Max A x 2 for bed mobility and Max A x 2 for transfers to chair w/ L AFO donned and assist to step with feet. Pt requires overall Mod A for UB ADL and Total A for LB ADLs. Recommend return to [facility] once medically stable w/ consideration of therapy follow up at facility as pt presenting below reported functional baseline. Will continue to follow acutely.      If plan is discharge home, recommend the following:   A lot of help with walking and/or transfers;Two people to help with walking and/or transfers;A lot of help with bathing/dressing/bathroom;Two people to help with bathing/dressing/bathroom     Functional Status Assessment   Patient has had a recent decline in their functional status and demonstrates the ability to make significant improvements in function in a reasonable and predictable amount of time.     Equipment Recommendations   None recommended by OT     Recommendations for Other Services         Precautions/Restrictions   Precautions Precautions: Fall;Other (comment) Precaution/Restrictions Comments: hx of L foot drop with L AFO Restrictions Weight Bearing Restrictions Per Provider Order: No     Mobility Bed Mobility Overal bed mobility: Needs Assistance Bed Mobility:  Rolling, Sidelying to Sit Rolling: Mod assist, +2 for physical assistance, +2 for safety/equipment Sidelying to sit: Max assist, +2 for physical assistance, +2 for safety/equipment, Used rails       General bed mobility comments: cues for LUE to bedrail to assist with rolling and pushing self up, use of bed pad to roll and scoot to EOB with heavy assist to lift trunk    Transfers Overall transfer level: Needs assistance Equipment used: 2 person hand held assist, Ambulation equipment used Transfers: Sit to/from Stand, Bed to chair/wheelchair/BSC Sit to Stand: Mod assist, +2 physical assistance, +2 safety/equipment, Via lift equipment     Step pivot transfers: Max assist, +2 physical assistance, +2 safety/equipment     General transfer comment: Mod A x 2 to stand with pt wrapping UE around therapists' UE. Manual assist to step both feet to chair and correct posterior LOB w/ +2 needed. Trial of Stedy from recliner with assist for trunk flexion to reach Stedy bars but able to pull up with Mod A x 2      Balance Overall balance assessment: Needs assistance Sitting-balance support: No upper extremity supported, Feet supported, Bilateral upper extremity supported Sitting balance-Leahy Scale: Poor Sitting balance - Comments: initial assist needed to correct posterior bias EOB with Min-Mod A progressing to CGA/Supervision > 5 min Postural control: Posterior lean Standing balance support: Bilateral upper extremity supported, During functional activity Standing balance-Leahy Scale: Poor                             ADL either performed or assessed  with clinical judgement   ADL Overall ADL's : Needs assistance/impaired Eating/Feeding: Set up   Grooming: Set up;Sitting;Wash/dry face   Upper Body Bathing: Moderate assistance;Sitting   Lower Body Bathing: Maximal assistance;+2 for safety/equipment;+2 for physical assistance;Sit to/from stand   Upper Body Dressing : Moderate  assistance;Sitting   Lower Body Dressing: Total assistance;+2 for physical assistance;+2 for safety/equipment;Sit to/from stand;Sitting/lateral leans Lower Body Dressing Details (indicate cue type and reason): Total for sock, shoe and AFO mgmt   Toilet Transfer Details (indicate cue type and reason): + 2 with [employer] Toileting- Clothing Manipulation and Hygiene: Total assistance;+2 for physical assistance;+2 for safety/equipment;Sit to/from stand;Sitting/lateral lean               Vision Ability to See in Adequate Light: 0 Adequate Patient Visual Report: No change from baseline Vision Assessment?: No apparent visual deficits     Perception         Praxis         Pertinent Vitals/Pain Pain Assessment Pain Assessment: No/denies pain     Extremity/Trunk Assessment Upper Extremity Assessment Upper Extremity Assessment: Generalized weakness;Right hand dominant;LUE deficits/detail LUE Deficits / Details: slightly weaker than RUE, some stiffness with elbow and shoulder   Lower Extremity Assessment Lower Extremity Assessment: Defer to PT evaluation   Cervical / Trunk Assessment Cervical / Trunk Assessment: Normal   Communication Communication Communication: Impaired Factors Affecting Communication: Difficulty expressing self   Cognition Arousal: Alert Behavior During Therapy: Flat affect Cognition: History of cognitive impairments             OT - Cognition Comments: slower processing time, multimodal cues for command following. flat affect. likely near baseline                 Following commands: Impaired Following commands impaired: Follows one step commands with increased time, Follows multi-step commands inconsistently     Cueing  General Comments   Cueing Techniques: Verbal cues;Gestural cues;Tactile cues;Visual cues  Family present during session, inquired about Stedy and feel this equipment would be helpful for pt at [facility]   Exercises      Shoulder Instructions      Home Living Family/patient expects to be discharged to:: Skilled nursing facility                             Home Equipment: Rolling Walker (2 wheels);Rollator (4 wheels);Wheelchair - manual   Additional Comments: [facility] x 2+ years      Prior Functioning/Environment Prior Level of Function : Needs assist       Physical Assist : Mobility (physical);ADLs (physical) Mobility (physical): Bed mobility;Transfers;Gait ADLs (physical): Bathing;Dressing;Toileting;IADLs Mobility Comments: Per daughter pt ambulates with staff assistance 1x/wk (66ft). ADLs Comments: Staff wheels pt to the bathroom in w/c where pt can pull up on bar and transfer to commode with CGA/min assist. pt reports being assisted with bathing and dressing out of the bed (EOB vs in wheelchair)    OT Problem List: Decreased strength;Impaired balance (sitting and/or standing);Decreased activity tolerance;Decreased cognition;Decreased knowledge of use of DME or AE   OT Treatment/Interventions: Self-care/ADL training;Therapeutic exercise;Energy conservation;DME and/or AE instruction;Therapeutic activities;Patient/family education;Balance training      OT Goals(Current goals can be found in the care plan section)   Acute Rehab OT Goals Patient Stated Goal: agreeable for OOB OT Goal Formulation: With patient/family Time For Goal Achievement: 09/22/23 Potential to Achieve Goals: Good   OT Frequency:  Min 1X/week  Co-evaluation PT/OT/SLP Co-Evaluation/Treatment: Yes Reason for Co-Treatment: For patient/therapist safety;To address functional/ADL transfers   OT goals addressed during session: ADL's and self-care;Proper use of Adaptive equipment and DME      AM-PAC OT "6 Clicks" Daily Activity     Outcome Measure Help from another person eating meals?: A Little Help from another person taking care of personal grooming?: A Little Help from another person toileting, which  includes using toliet, bedpan, or urinal?: Total Help from another person bathing (including washing, rinsing, drying)?: A Lot Help from another person to put on and taking off regular upper body clothing?: A Lot Help from another person to put on and taking off regular lower body clothing?: Total 6 Click Score: 12   End of Session Equipment Utilized During Treatment: Gait belt  Activity Tolerance: Patient tolerated treatment well Patient left: in chair;with call bell/phone within reach;with chair alarm set;with family/visitor present;Other (comment) (no nurse call cord available in room; family confirmed they will be in room with pt)  OT Visit Diagnosis: Other abnormalities of gait and mobility (R26.89);Unsteadiness on feet (R26.81)                Time: 1610-9604 OT Time Calculation (min): 25 min Charges:  OT General Charges $OT Visit: 1 Visit OT Evaluation $OT Eval Moderate Complexity: 1 Mod  Bradd Canary, OTR/L Acute Rehab Services Office: [phone]   Lorre Munroe 09/08/2023, 9:15 AM

## 2023-09-08 NOTE — Progress Notes (Signed)
 Speech Language Pathology Treatment: Dysphagia  Patient Details Name: [name] MRN: [id number] DOB: 1941-04-28 Today's Date: 09/08/2023 Time: 7829-5621 SLP Time Calculation (min) (ACUTE ONLY): 8 min  Assessment / Plan / Recommendation Clinical Impression  Following up after swallow assessment yesterday. On arrival pt's daughter ordering pt's lunch and dinner (egg salad sandwich and pot roast which she states will cut up for her). There were no observable oral delays with solid texture nor residue present. Following straw sips water pt noted to have intermittent and subtle throat clearing and slight wet vocal quality facilitated by verbal cues to clear throat. Pt's spontaneous throat clears may indicate sensation if having airway intrusion. Daughter states she sometimes coughs when she is not eating and will clear her throat occasionally during meals. Educated pt and daughter to check vocal quality during meals and produce throat clear if wet sounding. RN reported pt consumed single pill presentations with orange juice without difficulty. Continue regular diet with daughters ordering desired foods, thin liquids, pills one at a time with thin and intermittent throat clear if needed. No further ST needed.    HPI HPI: Patient is an 83 y.o. female with PMH: Parkinson's disease, PSP, osteomyelitis/discitis, peripheral neuropathy, OSA unable to tolerate CPAP, GERD. She presented from SNF to hospital on 09/06/23 for evaluation of generalized weakness with SNF staff reporting increasing confusion/hallucinations and poor PO intake. Daughter reported that SNF staff also were concerned of difficulty swallowing but she thinks that was due to them giving her a whole potassium pill instead of cutting it in half. Patient herself reportedly indicated that she just does not feel like eating or drinking. In ED, MRI brain negative for acute intracranial abnormality, mildly progressive midbrain volume loss may be  finding of PSP.      SLP Plan  All goals met;Discharge SLP treatment due to (comment)      Recommendations for follow up therapy are one component of a multi-disciplinary discharge planning process, led by the attending physician.  Recommendations may be updated based on patient status, additional functional criteria and insurance authorization.    Recommendations  Diet recommendations: Regular;Thin liquid Liquids provided via: Cup;Straw Medication Administration: Whole meds with liquid (one at a time) Supervision: Staff to assist with self feeding Compensations: Slow rate;Small sips/bites Postural Changes and/or Swallow Maneuvers: Seated upright 90 degrees;Upright 30-60 min after meal                  Oral care BID   Intermittent Supervision/Assistance Dysphagia, unspecified (R13.10)     All goals met;Discharge SLP treatment due to (comment)     Royce Macadamia  09/08/2023, 10:33 AM

## 2023-09-08 NOTE — Progress Notes (Signed)
  Progress Note   Patient: [name]:[id number] DOB: [birth year] DOA: 09/06/2023     1 DOS: the patient was seen and examined on 09/08/2023   Brief hospital course: 83 y.o. 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  Found to have left hydronephrosis and possible UTI.  Assessment and Plan:  Severe left hydronephrosis - CT abdomen pelvis with contrast noting severe left hydronephrosis with obstruction of left UPJ.  Urology consulted and following closely.  MRI returning this morning noting no characteristic tumor appearance but UPJ felt to be stenosed/occluded.  Also noted blood products and distended left renal collecting system.  Will heed urology recommendations and appreciate their input.  Urinary tract infection with possible pyelonephritis - Noted stranding on CT however no leukocytosis or outright fever.  Empiric ceftriaxone on board.  Urine culture showing NGTD.  New liver lesion - CT noting new 11 mm lesion.  Concern for metastatic disease.  MRI suggestive of hemangioma.  Apparently similar in size to previous MRI 12/17/2017.  Cough - Viral swabs negative.  No leukocytosis nor hypoxia nor tachypnea.  May be related to dysphagia.  Swallow eval ordered.  Normocytic anemia - Does not appear to have any acute bleeding.  Hemoglobin stable.  Acute kidney injury on CKD 3 - Likely secondary to obstruction from above complicated by poor p.o. intake.  Will recheck BMP in AM.  Will need to wait 24 hours between contrast bolus  for imaging.  Creatinine appears stable.  Paroxysmal atrial fibrillation - Continue home medication regiment.  Hypertension - Continue home medication regiment.     Subjective: Patient resting in bed, says she feels better this morning.  Appetite improved.  Denies shortness of breath, chest pain, nausea, vomiting, abdominal pain.  Family at bedside, appreciating updates.  Physical Exam: Vitals:   09/08/23 0020 09/08/23 0550 09/08/23 0902 09/08/23 1042  BP: (!) 169/70 (!) 172/72 (!) 126/56 124/64  Pulse: 88 88 95 95  Resp: 19  16   Temp: 98.4 F (36.9 C) (!) 97.5 F (36.4 C) 98.1 F (36.7 C) 98.5 F (36.9 C)  TempSrc:  Oral Oral Oral  SpO2: 97% 95% 96% 97%  Weight:      Height:       GENERAL:  Alert, pleasant, no acute distress HEENT:  EOMI CARDIOVASCULAR:  RRR, no murmurs appreciated RESPIRATORY:  Clear to auscultation, no wheezing, rales, or rhonchi GASTROINTESTINAL:  Soft, nontender, nondistended EXTREMITIES:  No LE edema bilaterally NEURO:  No new focal deficits appreciated SKIN:  No rashes noted PSYCH:  Appropriate mood and affect   Data Reviewed:  There are no new results to review at this time.  Family Communication: Daughter at bedside  Disposition: Status is: Inpatient Remains inpatient appropriate because: Pyelonephritis with possible obstruction and hydronephrosis  Planned Discharge Destination: Home with Home Health    Time spent: 34 minutes  Author: Deanna Artis, DO 09/08/2023 12:10 PM  For on call review [email or web address].

## 2023-09-08 NOTE — Progress Notes (Signed)
 Ok to reduce gabapentin to 600mg  PO BID for renal function per Dr. Sharlene Dory.  Ulyses Southward, PharmD, BCIDP, AAHIVP, CPP Infectious Disease Pharmacist 09/08/2023 2:24 PM

## 2023-09-08 NOTE — Plan of Care (Signed)

## 2023-09-08 NOTE — TOC Initial Note (Signed)
 Transition of Care [facility]) - Initial/Assessment Note    Patient Details  Name: [name] MRN: [id number] Date of Birth: March 04, 1941  Transition of Care [facility]) CM/SW Contact:    Timoty Bourke A [place], LCSW Phone Number: 09/08/2023, 12:04 PM  Clinical Narrative:                  CSW contacted pt's daughter Cala Bradford to assist with pt's assessment. She stated that pt is from [facility] for LTC.   CSW followed up with French Ana, she confirmed with CSW that pt is LTC resident. Pt can return when medically stable.    TOC will continue to follow.   Expected Discharge Plan: Skilled Nursing Facility Barriers to Discharge: Continued Medical Work up   Patient Goals and CMS Choice            Expected Discharge Plan and Services In-house Referral: Clinical Social Work     Living arrangements for the past 2 months: Skilled Nursing Facility                                      Prior Living Arrangements/Services Living arrangements for the past 2 months: Skilled Nursing Facility Lives with:: Facility Resident          Need for Family Participation in Patient Care: Yes (Comment) Care giver support system in place?: Yes (comment) (pt's daughter, Cala Bradford)      Activities of Daily Living   ADL Screening (condition at time of admission) Independently performs ADLs?: No Does the patient have a NEW difficulty with bathing/dressing/toileting/self-feeding that is expected to last >3 days?: Yes (Initiates electronic notice to provider for possible OT consult) Does the patient have a NEW difficulty with getting in/out of bed, walking, or climbing stairs that is expected to last >3 days?: Yes (Initiates electronic notice to provider for possible PT consult) Does the patient have a NEW difficulty with communication that is expected to last >3 days?: No Is the patient deaf or have difficulty hearing?: No Does the patient have difficulty seeing, even when wearing glasses/contacts?: No Does  the patient have difficulty concentrating, remembering, or making decisions?: Yes  Permission Sought/Granted                  Emotional Assessment Appearance:: Appears stated age Attitude/Demeanor/Rapport: Unable to Assess Affect (typically observed): Unable to Assess Orientation: : Oriented to Self, Oriented to Place, Oriented to Situation Alcohol / Substance Use: Not Applicable Psych Involvement: No (comment)  Admission diagnosis:  Hydronephrosis [N13.30] Liver lesion [K76.9] Generalized weakness [R53.1] AKI (acute kidney injury) (HCC) [N17.9] Subacute cough [R05.2] Hydronephrosis, unspecified hydronephrosis type [N13.30] Constipation, unspecified constipation type [K59.00] Dysphagia, unspecified type [R13.10] Hydronephrosis of left kidney [N13.30] Patient Active Problem List   Diagnosis Date Noted   Hydronephrosis of left kidney 09/07/2023   Hydronephrosis 09/06/2023   Dysphagia 09/06/2023   Liver lesion 09/06/2023   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   Pain due to onychomycosis of toenails of both feet 12/01/2020   Encephalopathy due to COVID-19 virus 06/04/2020   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/10/2019   UTI (urinary tract infection) 12/06/2018   Vertebral osteomyelitis (HCC) 12/05/2018   Spinal stenosis 12/05/2018   AKI (acute kidney injury) (HCC) 12/05/2018   Acute metabolic encephalopathy 10/07/2018   Chronic diastolic (congestive) heart failure (HCC) 10/07/2018   GERD (gastroesophageal reflux disease) 10/07/2018   Abnormal glucose tolerance test 07/01/2018   Colon, diverticulosis  07/01/2018   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   Glaucoma 07/01/2018   Stage 3a chronic kidney disease (HCC) 07/01/2018   Neuropathy 07/01/2018   Pure hypercholesterolemia 07/01/2018   Restless leg 07/01/2018   Discitis of thoracolumbar region 07/01/2018   Acute bronchitis due to infection 05/04/2017   Cough 04/21/2016   Preoperative cardiovascular examination 04/18/2015   Parkinsonism  (HCC) 05/15/2014   Chronic anticoagulation -warfarin therapy 02/03/2014   Atrial fibrillation with RVR (HCC) 01/31/2014   Gait instability 11/16/2013   Edema 03/03/2010   ATRIAL FIBRILLATION 04/23/2008   Hyperlipidemia 01/31/2008   Obstructive sleep apnea 01/31/2008   Unspecified glaucoma 01/30/2008   Seasonal and perennial allergic rhinitis 01/30/2008   Osteoarthritis 01/30/2008   PCP:  Garlan Fillers, MD Pharmacy:   [facility] [phone] [address], [address] - [address] AT [address] [place] [address] Phone: [phone] Fax: [phone]  [facility] - [place], [place] - [address] [place] [address] Phone: [phone] Fax: [phone]     Social Drivers of Health (SDOH) Social History: SDOH Screenings   Food Insecurity: No Food Insecurity (09/06/2023)  Housing: Low Risk  (09/06/2023)  Transportation Needs: No Transportation Needs (09/06/2023)  Utilities: Not At Risk (09/06/2023)  Depression (PHQ2-9): Low Risk  (08/23/2023)  Social Connections: Moderately Isolated (09/06/2023)  Tobacco Use: Low Risk  (09/06/2023)   SDOH Interventions:     Readmission Risk Interventions     No data to display

## 2023-09-09 DIAGNOSIS — N1330 Unspecified hydronephrosis: Secondary | ICD-10-CM | POA: Diagnosis not present

## 2023-09-09 LAB — BASIC METABOLIC PANEL WITH GFR
Anion gap: 8 (ref 5–15)
BUN: 16 mg/dL (ref 8–23)
CO2: 25 mmol/L (ref 22–32)
Calcium: 8.7 mg/dL — ABNORMAL LOW (ref 8.9–10.3)
Chloride: 104 mmol/L (ref 98–111)
Creatinine, Ser: 1.37 mg/dL — ABNORMAL HIGH (ref 0.44–1.00)
GFR, Estimated: 39 mL/min — ABNORMAL LOW (ref >60)
Glucose, Bld: 139 mg/dL — ABNORMAL HIGH (ref 70–99)
Potassium: 4 mmol/L (ref 3.5–5.1)
Sodium: 137 mmol/L (ref 135–145)

## 2023-09-09 LAB — CBC
HCT: 34.2 % — ABNORMAL LOW (ref 36.0–46.0)
Hemoglobin: 11 g/dL — ABNORMAL LOW (ref 12.0–15.0)
MCH: 28 pg (ref 26.0–34.0)
MCHC: 32.2 g/dL (ref 30.0–36.0)
MCV: 87 fL (ref 80.0–100.0)
Platelets: 315 10*3/uL (ref 150–400)
RBC: 3.93 MIL/uL (ref 3.87–5.11)
RDW: 15 % (ref 11.5–15.5)
WBC: 6.7 10*3/uL (ref 4.0–10.5)
nRBC: 0 % (ref 0.0–0.2)

## 2023-09-09 MED ORDER — SULFAMETHOXAZOLE-TRIMETHOPRIM 800-160 MG PO TABS: 1.0000 | ORAL_TABLET | Freq: Two times a day (BID) | ORAL | Status: DC

## 2023-09-09 MED ORDER — CAPSAICIN 0.075 % EX CREA: TOPICAL_CREAM | Freq: Two times a day (BID) | CUTANEOUS | Status: AC

## 2023-09-09 MED ORDER — GABAPENTIN 300 MG PO CAPS
600.0000 mg | ORAL_CAPSULE | Freq: Two times a day (BID) | ORAL | Status: AC
Start: 2023-09-09 — End: ?

## 2023-09-09 NOTE — Care Management Important Message (Signed)
 Important Message  Patient Details  Name: [name] MRN: [id number] Date of Birth: [birth year]   Important Message Given:  Yes - Medicare IM     Dorena Bodo 09/09/2023, 2:29 PM

## 2023-09-09 NOTE — Discharge Summary (Signed)
 Physician Discharge Summary   Patient: [name] MRN: [id number] DOB: [birth year]  Admit date:     09/06/2023  Discharge date: 09/09/23  Discharge Physician: Deanna Artis   PCP: Garlan Fillers, MD   Recommendations at discharge:   At this time patient will be discharged to SNF.  If you experience any symptoms such as fever, vomiting, shortness of breath, chest pain, abdominal pain, or other concerning symptoms, please call your primary care provider or go to the emergency department immediately.  Discharge Diagnoses: Principal Problem:   Hydronephrosis Active Problems:   ATRIAL FIBRILLATION   Parkinsonism (HCC)   AKI (acute kidney injury) (HCC)   PSP (progressive supranuclear palsy) (HCC)   Dysphagia   Liver lesion   Hydronephrosis of left kidney  Resolved Problems:   * No resolved hospital problems. *  Hospital Course: 83 y.o. 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  Found to have left hydronephrosis and possible UTI.   Assessment and Plan:   Severe left hydronephrosis - CT abdomen pelvis with contrast noting severe left hydronephrosis with obstruction of left UPJ.  Urology consulted and following closely.  MRI returning this morning noting no characteristic tumor appearance but UPJ felt to be stenosed/occluded.  Also noted blood products and distended left renal collecting system.  Evaluated by urology, stable renal function, no leukocytosis, no urgent need for intervention while inpatient.  At this time  we will discharge patient with referral to outpatient urology.  May likely need follow-up Lasix renogram and possible future ureteroscopy.    Urinary tract infection with possible pyelonephritis - Noted stranding on CT however no leukocytosis or outright fever.  Empiric ceftriaxone on board while inpatient.  Urine culture showing NGTD.  Will discharge with empiric Bactrim to take as directed.   New liver lesion - CT noting new 11 mm lesion.  Concern for metastatic disease.  MRI suggestive of hemangioma.  Apparently similar in size to previous MRI 12/17/2017.   Cough - Viral swabs negative.  No leukocytosis nor hypoxia nor tachypnea.  May be related to dysphagia.  Diet recommendations regular, thin liquid.   Normocytic anemia - Does not appear to have any acute bleeding.  Hemoglobin stable.   Acute kidney injury on CKD 3 - Likely secondary to obstruction from above complicated by poor p.o. intake.  Encourage oral hydration.    Paroxysmal atrial fibrillation - Continue home medication regiment.   Hypertension - Continue home medication regiment.       Pain control - [employer] Controlled Substance Reporting System database was reviewed. and patient was instructed, not to drive, operate heavy machinery, perform activities at heights, swimming or participation in water activities or provide baby-sitting services while on Pain, Sleep and Anxiety Medications; until their outpatient Physician has advised to do so again. Also recommended to not to take more than prescribed Pain, Sleep and Anxiety Medications.  Consultants: Urology Procedures performed: None Disposition: Skilled nursing facility Diet recommendation:  Discharge Diet Orders (From admission, onward)     Start     Ordered   09/09/23 0000  Diet - low sodium heart healthy        09/09/23 1041  Cardiac diet DISCHARGE MEDICATION: Allergies as of 09/09/2023       Reactions   Lyrica [pregabalin] Other (See  Comments)   Felt loopy   Zocor [simvastatin] Other (See Comments)   Myalgias   Relafen [nabumetone] Rash        Medication List     TAKE these medications    acetaminophen 500 MG tablet Commonly known as: TYLENOL Take 1,000 mg by mouth 2 (two) times daily.   Aspercreme Lidocaine 4 % Crea Generic drug: Lidocaine HCl Apply 1 Application topically 2 (two) times daily. Apply to both feet topically two times a day for pain   Boost Original Liqd Take 8 oz by mouth 2 (two) times daily.   capsicum 0.075 % topical cream Commonly known as: ZOSTRIX Apply topically 2 (two) times daily.   carbidopa-levodopa 25-100 MG tablet Commonly known as: SINEMET IR 3 at 8am/2 at noon/2 at 4pm x 2 weeks and then take 2 tablets at 8am/noon/4pm What changed:  how much to take how to take this when to take this additional instructions   cetirizine 10 MG tablet Commonly known as: ZYRTEC Take 10 mg by mouth daily.   Cholecalciferol 50 MCG (2000 UT) Tabs Take 4,000 Units by mouth daily.   cilostazol 50 MG tablet Commonly known as: PLETAL Take 50 mg by mouth 2 (two) times daily.   Culturelle Kids Pack Take 1 tablet by mouth daily.   Eliquis 2.5 MG Tabs tablet Generic drug: apixaban Take 2.5 mg by mouth 2 (two) times daily.   flecainide 50 MG tablet Commonly known as: TAMBOCOR Take 50 mg by mouth daily.   fluticasone 50 MCG/ACT nasal spray Commonly known as: FLONASE Place 2 sprays into both nostrils daily.   furosemide 20 MG tablet Commonly known as: LASIX TAKE 1 TABLET(20 MG) BY MOUTH DAILY   gabapentin 300 MG capsule Commonly known as: NEURONTIN Take 2 capsules (600 mg total) by mouth 2 (two) times daily. What changed: when to take this   metoprolol succinate 25 MG 24 hr tablet Commonly known as: TOPROL-XL Take 12.5 mg by mouth daily.   potassium chloride SA 20 MEQ tablet Commonly known as: KLOR-CON M Take 1 tablet (20 mEq total) by mouth daily. What changed: when to  take this   pravastatin 40 MG tablet Commonly known as: PRAVACHOL Take 40 mg by mouth at bedtime.   PROSTAT PO Take 30 mLs by mouth 2 (two) times daily.   sulfamethoxazole-trimethoprim 800-160 MG tablet Commonly known as: Bactrim DS Take 1 tablet by mouth 2 (two) times daily.   Super B Complex Maxi Tabs Take 1 tablet by mouth daily.   traMADol 50 MG tablet Commonly known as: ULTRAM Take 50 mg by mouth 4 (four) times daily.   Travatan Z 0.004 % Soln ophthalmic solution Generic drug: Travoprost (BAK Free) Place 1 drop into both eyes at bedtime.        Discharge Exam: Filed Weights   09/06/23 1210  Weight: 88 kg   GENERAL:  Alert, pleasant, no acute distress HEENT:  EOMI CARDIOVASCULAR:  RRR, no murmurs appreciated RESPIRATORY:  Clear to auscultation, no wheezing, rales, or rhonchi GASTROINTESTINAL:  Soft, nontender, nondistended EXTREMITIES:  No LE edema bilaterally NEURO:  No new focal deficits appreciated SKIN:  No rashes noted PSYCH:  Appropriate mood and affect   Condition at discharge: improving  The results of significant diagnostics from this hospitalization (including imaging, microbiology, ancillary and laboratory) are listed below for reference.   Imaging Studies:  MR ABDOMEN W WO CONTRAST Result Date: 09/08/2023 CLINICAL DATA:  Left renal pelvic lesion. Indeterminate hepatic lesion. EXAM: MRI ABDOMEN WITHOUT AND WITH CONTRAST TECHNIQUE: Multiplanar multisequence MR imaging of the abdomen was performed both before and after the administration of intravenous contrast. CONTRAST:  9mL GADAVIST GADOBUTROL 1 MMOL/ML IV SOLN COMPARISON:  CT scan 09/06/2023 FINDINGS: Metal artifact related to the patient's extensive thoracolumbar posterolateral rod and pedicle screw fixation. Despite efforts by the technologist and patient, motion artifact is present on today's exam and could not be eliminated. These factors in addition to body habitus reduce exam sensitivity and  specificity. Lower chest: Mild cardiomegaly. Probable atelectasis medially in the right lower lobe on image 1 series 9. Trace left pleural effusion. Hepatobiliary: 0.8 cm mildly T2 hyperintense lesion in the right hepatic lobe on image 9 series 5 demonstrates low T1 signal, arterial phase lack of enhancement, and becomes reduced in conspicuity on later phase images. On delayed images the lesion is essentially isoenhancing to the liver. Although the appearance is nonspecific, hemangioma is favored. This lesion is not changed from the 12/17/2017 MRI. No biliary dilatation observed.  Gallbladder absent. Pancreas:  Partial pancreas divisum.  Otherwise unremarkable. Spleen:  Unremarkable Adrenals/Urinary Tract: Blood products substantially distended renal collecting system, along with substantial simple left parapelvic cysts. Stranding and mild enhancement along the anterior margin of the collecting system although this could be inflammatory and/or related to the left renal vein. I do not see a characteristic appearance for tumor but the UPJ is felt to be stenosed or occluded given the lack of hydroureter. A cause for the substantial blood products in the left collecting system is not identified. Urology consultation recommended if not already performed. Stomach/Bowel: Unremarkable Vascular/Lymphatic:  Aortic atherosclerosis. Other:  No supplemental non-categorized findings. Musculoskeletal: Extensive thoracolumbar postoperative findings. IMPRESSION: 1. Blood products within substantially distended left renal collecting system, along with simple left parapelvic cysts. Inflammatory findings in the left renal pelvis along the distended collecting system. I do not see a characteristic appearance for tumor but the UPJ is felt to be stenosed or occluded given the lack of hydroureter. A cause for the substantial blood products in the left collecting system is not identified. Urology consultation recommended if not already  performed. 2. 0.8 cm lesion in the right hepatic lobe is not changed from the 12/17/2017 MRI. Although the appearance is nonspecific, hemangioma is favored. 3. Mild cardiomegaly. 4. Trace left pleural effusion. 5. Partial pancreas divisum. 6. Extensive thoracolumbar postoperative findings. 7. Aortic Atherosclerosis (ICD10-I70.0). Electronically Signed   By: Gaylyn Rong M.D.   On: 09/08/2023 11:48   MR Brain W and Wo Contrast Result Date: 09/06/2023 CLINICAL DATA:  Transient ischemic attack EXAM: MRI HEAD WITHOUT AND WITH CONTRAST TECHNIQUE: Multiplanar, multiecho pulse sequences of the brain and surrounding structures were obtained without and with intravenous contrast. CONTRAST:  8mL GADAVIST GADOBUTROL 1 MMOL/ML IV SOLN COMPARISON:  11/28/2013 FINDINGS: Brain: No acute infarct, mass effect or extra-axial collection. No acute or chronic hemorrhage. There is multifocal hyperintense T2-weighted signal within the white matter. Generalized volume loss. The midline structures are normal. Dilated perivascular space of the left lentiform nucleus. No abnormal contrast enhancement. Since 11/28/2013, there has been mild progressive mid brain volume loss. Vascular: Normal flow voids. Skull and upper cervical spine: Normal calvarium and skull base. Visualized upper cervical spine and soft tissues are normal. Sinuses/Orbits:No paranasal sinus fluid levels or advanced mucosal thickening. No mastoid or middle ear effusion. Normal orbits. IMPRESSION: 1. No acute intracranial  abnormality. 2. 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 3. Findings of chronic small vessel ischemia and volume loss. Electronically Signed   By: Deatra Robinson M.D.   On: 09/06/2023 21:14   CT ABDOMEN PELVIS W CONTRAST Result Date: 09/06/2023 CLINICAL DATA:  Concern for bowel obstruction. EXAM: CT ABDOMEN AND PELVIS WITH CONTRAST TECHNIQUE: Multidetector CT imaging of the abdomen and pelvis was  performed using the standard protocol following bolus administration of intravenous contrast. RADIATION DOSE REDUCTION: This exam was performed according to the departmental dose-optimization program which includes automated exposure control, adjustment of the mA and/or kV according to patient size and/or use of iterative reconstruction technique. CONTRAST:  75mL OMNIPAQUE IOHEXOL 350 MG/ML SOLN COMPARISON:  CT abdomen pelvis dated 03/13/2020. FINDINGS: Lower chest: Trace left pleural effusion and left lung base atelectasis. Mild cardiomegaly. Coronary vascular calcification. No intra-abdominal free air or free fluid. Hepatobiliary: Indeterminate 11 mm hypoenhancing lesion in the dome of the liver, new since the prior CT. Metastatic disease is not excluded. No biliary dilatation. Cholecystectomy. Pancreas: Unremarkable. No pancreatic ductal dilatation or surrounding inflammatory changes. Spleen: Normal in size without focal abnormality. Adrenals/Urinary Tract: The adrenal glands unremarkable. There is an ill-defined high attenuating density in the left renal collecting system and pelvis measuring approximately 5.5 x 2.9 cm (coronal 44/6) which may represent blood clot or a urothelial mass. Nonobstructing left renal inferior pole calculi measuring up to 15 mm. There is severe left hydronephrosis with transition at the ureteropelvic junction. There is left renal perinephric and periureteric stranding with loss of fat plane between the kidney and left psoas muscle. 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 Urology consult and further evaluation with MRI without and with contrast or direct visualization with scope is recommended. Several small left renal cysts noted. There is no hydronephrosis on the right. There is trabeculated appearance of the bladder wall likely related to chronic bladder dysfunction. Small amount of air within the bladder may have been introduced by recent  instrumentation. Stomach/Bowel: There is sigmoid diverticulosis. Moderate stool throughout the colon. There is no bowel obstruction or active inflammation. The appendix is normal. Vascular/Lymphatic: Moderate aortoiliac atherosclerotic disease. The IVC is unremarkable. No portal venous gas. There is no adenopathy. Reproductive: Hysterectomy.  No suspicious adnexal masses. Other: None Musculoskeletal: Osteopenia with degenerative changes of the spine. Extensive posterior spinal fusion hardware. No acute osseous pathology IMPRESSION: 1. Severe left hydronephrosis with transition at the ureteropelvic junction. 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 Urology consult and further evaluation with MRI without and with contrast or direct visualization with scope is recommended. 2. Nonobstructing left renal inferior pole calculi. Left perinephric stranding suspicious for superimposed infection. Correlation with urinalysis recommended. 3. Sigmoid diverticulosis. No bowel obstruction. Normal appendix. 4. Indeterminate 11 mm hypoenhancing lesion in the dome of the liver, new since the prior CT. Metastatic disease is not excluded. Electronically Signed   By: Elgie Collard M.D.   On: 09/06/2023 20:06   DG Chest 2 View Result Date: 09/06/2023 CLINICAL DATA:  Cough, fatigue and weakness. EXAM: CHEST - 2 VIEW COMPARISON:  Chest radiograph dated 06/05/2020. FINDINGS: Shallow inspiration. Left lung base atelectasis versus infiltrate. No large pleural effusion. No pneumothorax. Stable cardiac silhouette. Atherosclerotic calcification of the aorta. No acute osseous pathology. Posterior spinal fusion hardware. IMPRESSION: Left lung base atelectasis versus infiltrate. Electronically Signed   By: Elgie Collard M.D.   On: 09/06/2023 17:25  Microbiology: Results for orders placed or performed during the hospital encounter of 09/06/23  Resp panel by RT-PCR (RSV, Flu A&B, Covid) Anterior  Nasal Swab     Status: None   Collection Time: 09/06/23  2:42 PM   Specimen: Anterior Nasal Swab  Result Value Ref Range Status   SARS Coronavirus 2 by RT PCR NEGATIVE NEGATIVE Final   Influenza A by PCR NEGATIVE NEGATIVE Final   Influenza B by PCR NEGATIVE NEGATIVE Final    Comment: (NOTE) The Xpert Xpress SARS-CoV-2/FLU/RSV plus assay is intended as an aid in the diagnosis of influenza from Nasopharyngeal swab specimens and should not be used as a sole basis for treatment. Nasal washings and aspirates are unacceptable for Xpert Xpress SARS-CoV-2/FLU/RSV testing.  Fact Sheet for Patients: BloggerCourse.com  Fact Sheet for Healthcare Providers: SeriousBroker.it  This test is not yet approved or cleared by the [place] FDA and has been authorized for detection and/or diagnosis of SARS-CoV-2 by FDA under an Emergency Use Authorization (EUA). This EUA will remain in effect (meaning this test can be used) for the duration of the COVID-19 declaration under Section 564(b)(1) of the Act, 21 U.S.C. section 360bbb-3(b)(1), unless the authorization is terminated or revoked.     Resp Syncytial Virus by PCR NEGATIVE NEGATIVE Final    Comment: (NOTE) Fact Sheet for Patients: BloggerCourse.com  Fact Sheet for Healthcare Providers: SeriousBroker.it  This test is not yet approved or cleared by the [place] FDA and has been authorized for detection and/or diagnosis of SARS-CoV-2 by FDA under an Emergency Use Authorization (EUA). This EUA will remain in effect (meaning this test can be used) for the duration of the COVID-19 declaration under Section 564(b)(1) of the Act, 21 U.S.C. section 360bbb-3(b)(1), unless the authorization is terminated or revoked.  Performed at [facility], [address] [place], [place] [address]   Culture, blood (Routine X 2) w Reflex to ID Panel      Status: None (Preliminary result)   Collection Time: 09/06/23 10:45 PM   Specimen: BLOOD LEFT ARM  Result Value Ref Range Status   Specimen Description BLOOD LEFT ARM  Final   Special Requests   Final    BOTTLES DRAWN AEROBIC AND ANAEROBIC Blood Culture results may not be optimal due to an inadequate volume of blood received in culture bottles   Culture   Final    NO GROWTH 3 DAYS Performed at [facility], [address] [place], [place] [address]    Report Status PENDING  Incomplete  Culture, blood (Routine X 2) w Reflex to ID Panel     Status: None (Preliminary result)   Collection Time: 09/06/23 11:00 PM   Specimen: BLOOD LEFT HAND  Result Value Ref Range Status   Specimen Description BLOOD LEFT HAND  Final   Special Requests   Final    BOTTLES DRAWN AEROBIC ONLY Blood Culture adequate volume   Culture   Final    NO GROWTH 3 DAYS Performed at [facility], [address], [place], [place] [address]    Report Status PENDING  Incomplete    Labs: CBC: Recent Labs  Lab 09/06/23 1220 09/06/23 2359 09/08/23 0738 09/09/23 0837  WBC 7.9 8.0 7.0 6.7  NEUTROABS 6.1  --   --   --   HGB 10.6* 9.6* 10.7* 11.0*  HCT 33.7* 31.6* 33.0* 34.2*  MCV 89.2 92.1 86.2 87.0  PLT 234 237 295 315   Basic Metabolic Panel: Recent  Labs  Lab 09/06/23 1220 09/06/23 2359 09/08/23 0738 09/09/23 0837  NA 137 138 138 137  K 4.4 4.2 4.0 4.0  CL 104 104 105 104  CO2 23 22 23 25   GLUCOSE 117* 94 90 139*  BUN 19 16 13 16   CREATININE 1.21* 1.25* 1.21* 1.37*  CALCIUM 8.5* 8.5* 8.9 8.7*  MG 2.1  --  2.0  --    Liver Function Tests: Recent Labs  Lab 09/06/23 1220 09/06/23 2359  AST 60* 49*  ALT 10 27  ALKPHOS 97 94  BILITOT 0.6 0.6  PROT 5.9* 5.7*  ALBUMIN 2.5* 2.4*   CBG: No results for input(s): "GLUCAP" in the last 168 hours.  Discharge time spent: less than 30 minutes.  Signed: Deanna Artis, DO [facility] 09/09/2023

## 2023-09-09 NOTE — Plan of Care (Signed)

## 2023-09-09 NOTE — TOC Transition Note (Signed)
 Transition of Care [facility]) - Discharge Note   Patient Details  Name: [name] MRN: [id number] Date of Birth: [birth year]  [facility] CM/SW Contact:  Ory Elting A [place], LCSW Phone Number: 09/09/2023, 11:06 AM   Clinical Narrative:     Patient will DC to: [facility]  Anticipated DC date: 09/09/23  Family notified: [name]  Transport by: Sharin Mons      Per MD patient ready for DC to [facility] . RN, patient, patient's family, and facility notified of DC. Discharge Summary and FL2 sent to facility. RN to call report prior to discharge [phone]). DC packet on chart. Ambulance transport requested for patient.     CSW will sign off for now as social work intervention is no longer needed. Please consult [place] again if new needs arise.   Final next level of care: Skilled Nursing Facility Barriers to Discharge: Barriers Resolved   Patient Goals and CMS Choice            Discharge Placement              Patient chooses bed at: [facility] [place] Patient to be transferred to facility by: [facility] Name of family member notified: [name] Patient and family notified of of transfer: 09/09/23  Discharge Plan and Services Additional resources added to the After Visit Summary for   In-house Referral: Clinical Social Work                                   Social Drivers of Health (SDOH) Interventions SDOH Screenings   Food Insecurity: No Food Insecurity (09/06/2023)  Housing: Low Risk  (09/06/2023)  Transportation Needs: No Transportation Needs (09/06/2023)  Utilities: Not At Risk (09/06/2023)  Depression (PHQ2-9): Low Risk  (08/23/2023)  Social Connections: Moderately Isolated (09/06/2023)  Tobacco Use: Low Risk  (09/06/2023)     Readmission Risk Interventions     No data to display

## 2023-09-09 NOTE — Treatment Plan (Signed)
 UROLOGY TREATMENT PLAN:  Patient had an MRI with and without contrast yesterday.  Concern for dilation of the left kidney to the level of the UPJ with some blood product.  Patient remains hemodynamically stable by vitals.  Labs indicate stable creatinine of around 1.2.  Labs from today are not currently back at.  Hemoglobin yesterday was 10.7 from 9.6.  She still does not have a white count and her blood cultures are currently negative.  At this time, do not feel like her clinical picture is from her kidney.  Would recommend [facility] with a Lasix renogram and likely future ureteroscopy.  No need for any inpatient intervention.  Discussed with Dr. Berneice Heinrich.   Jerald Kief, MD, PhD [facility] Resident  [facility]

## 2023-09-09 NOTE — Progress Notes (Signed)
 Mobility Specialist: Progress Note   09/09/23 1101  Mobility  Activity Transferred from bed to chair  Level of Assistance +2 (takes two people)  Assistive Device Stedy  Activity Response Tolerated well  Mobility Referral Yes  Mobility visit 1 Mobility  Mobility Specialist Start Time (ACUTE ONLY) 1003  Mobility Specialist Stop Time (ACUTE ONLY) 1017  Mobility Specialist Time Calculation (min) (ACUTE ONLY) 14 min    Pt was agreeable to mobility session with encouragement - received in bed. MaxA+2 for bed mobility and STS. MS donned shoes/L AFO brace on EOB. No complaints. Left in chair with all needs met, call bell in reach. Chair alarm on. Family in room.   Maurene Capes Mobility Specialist Please contact via SecureChat or Rehab office at [phone]

## 2023-09-09 NOTE — Treatment Plan (Signed)
 UROLOGY TREATMENT PLAN NOTE:  Discussed results of MRI with the daughter Selena Batten and talked about follow up plan. All of her questions were answered.   Jerald Kief, MD, PhD [facility] Resident  [facility]rology

## 2023-09-11 LAB — CULTURE, BLOOD (ROUTINE X 2)
Culture: NO GROWTH
Culture: NO GROWTH
Special Requests: ADEQUATE

## 2023-10-04 ENCOUNTER — Encounter: Payer: Self-pay | Admitting: Physical Medicine & Rehabilitation

## 2023-10-04 ENCOUNTER — Encounter: Attending: Physical Medicine & Rehabilitation | Admitting: Physical Medicine & Rehabilitation

## 2023-10-04 VITALS — BP 129/69 | HR 93 | Ht 66.0 in

## 2023-10-04 DIAGNOSIS — G248 Other dystonia: Secondary | ICD-10-CM | POA: Diagnosis present

## 2023-10-04 NOTE — Progress Notes (Signed)
 Subjective:    Patient ID: [name], female    DOB: 02/10/1941, 83 y.o.   MRN: [id number]  HPI 83 year old female with progressive supranuclear palsy who resides in a skilled nursing facility.  She is here for left foot dystonia.  She underwent botulinum toxin injection to the posterior tibialis FDL and FHL muscles approximately 6 weeks ago.  According to her daughter there has been no significant improvement in her symptoms. She had no adverse effects from the injections no excessive bruising etc.  Pain Inventory Average Pain 8 Pain Right Now 5 My pain is sharp, burning, tingling, and aching  In the last 24 hours, has pain interfered with the following? General activity 8 Relation with others 8 Enjoyment of life 10 What TIME of day is your pain at its worst? night Sleep (in general) Fair  Pain is worse with: walking and standing Pain improves with:  na Relief from Meds: 2  Family History  Problem Relation Age of Onset   Arthritis Mother    Heart attack Father    Hypertension Sister    Lung cancer Sister    Asthma Sister    Allergies Sister    Social History   Socioeconomic History   Marital status: Married    Spouse name: [name]   Number of children: 2   Years of education: 12th    Highest education level: Not on file  Occupational History   Occupation: retired    Associate Professor: RETIRED    Comment: clerical office work  Tobacco Use   Smoking status: Never   Smokeless tobacco: Never  Vaping Use   Vaping status: Never Used  Substance and Sexual Activity   Alcohol  use: No    Alcohol /week: 0.0 standard drinks of alcohol    Drug use: No   Sexual activity: Never    Comment: hysterectomy  Other Topics Concern   Not on file  Social History Narrative   Patient lives at home with husband Autry Legions.   Patient is retired.   Patient has a high school education.    Patient has 2 children .   RIGHT HANDED   Social Drivers of Health   Financial Resource Strain: Not  on file  Food Insecurity: No Food Insecurity (09/06/2023)   Hunger Vital Sign    Worried About Running Out of Food in the Last Year: Never true    Ran Out of Food in the Last Year: Never true  Transportation Needs: No Transportation Needs (09/06/2023)   PRAPARE - Administrator, Civil Service (Medical): No    Lack of Transportation (Non-Medical): No  Physical Activity: Not on file  Stress: Not on file  Social Connections: Moderately Isolated (09/06/2023)   Social Connection and Isolation Panel [NHANES]    Frequency of Communication with Friends and Family: More than three times a week    Frequency of Social Gatherings with Friends and Family: More than three times a week    Attends Religious Services: Never    Database administrator or Organizations: Yes    Attends Banker Meetings: Never    Marital Status: Widowed   Past Surgical History:  Procedure Laterality Date   BACK SURGERY     CATARACT EXTRACTION W/ INTRAOCULAR LENS  IMPLANT, BILATERAL  2011   CHOLECYSTECTOMY N/A 07/07/2018   Procedure: LAPAROSCOPIC CHOLECYSTECTOMY;  Surgeon: Sim Dryer, MD;  Location: [facility] OR;  Service: General;  Laterality: N/A;   COLONOSCOPY  12/09/2004   LIPOMA EXCISION  1980's   "fatty tumors"   LUMBAR DISC SURGERY  02/2009   "ruptured disc"   NASAL SEPTUM SURGERY  80's   POSTERIOR LUMBAR FUSION  06/2010; 10/2011   "placed screws, rods, spacers both times"   POSTERIOR LUMBAR FUSION 4 LEVEL N/A 12/13/2018   Procedure: THORACIC NINE -LUMBAR THREE POSTERIOR INSTRUMENTATION FUSION;  Surgeon: Cannon Champion, MD;  Location: [facility] OR;  Service: Neurosurgery;  Laterality: N/A;   REPAIR DURAL / CSF LEAK  02/2009   THORACIC LAMINECTOMY FOR EPIDURAL ABSCESS N/A 09/28/2018   Procedure: THORACIC NINE-THORACIC TEN, THORACIC TEN-THORACIC ELEVEN, THORACIC ELEVEN-THORACIC TWELVE LAMINECTOMIES FOR EPIDURAL ABSCESS;  Surgeon: Cannon Champion, MD;  Location: [facility] OR;  Service: Neurosurgery;  Laterality:  N/A;   TOTAL ABDOMINAL HYSTERECTOMY  02/1993   Past Surgical History:  Procedure Laterality Date   BACK SURGERY     CATARACT EXTRACTION W/ INTRAOCULAR LENS  IMPLANT, BILATERAL  2011   CHOLECYSTECTOMY N/A 07/07/2018   Procedure: LAPAROSCOPIC CHOLECYSTECTOMY;  Surgeon: Sim Dryer, MD;  Location: [facility];  Service: General;  Laterality: N/A;   COLONOSCOPY  12/09/2004   LIPOMA EXCISION  1980's   "fatty tumors"   LUMBAR DISC SURGERY  02/2009   "ruptured disc"   NASAL SEPTUM SURGERY  80's   POSTERIOR LUMBAR FUSION  06/2010; 10/2011   "placed screws, rods, spacers both times"   POSTERIOR LUMBAR FUSION 4 LEVEL N/A 12/13/2018   Procedure: THORACIC NINE -LUMBAR THREE POSTERIOR INSTRUMENTATION FUSION;  Surgeon: Cannon Champion, MD;  Location: [facility];  Service: Neurosurgery;  Laterality: N/A;   REPAIR DURAL / CSF LEAK  02/2009   THORACIC LAMINECTOMY FOR EPIDURAL ABSCESS N/A 09/28/2018   Procedure: THORACIC NINE-THORACIC TEN, THORACIC TEN-THORACIC ELEVEN, THORACIC ELEVEN-THORACIC TWELVE LAMINECTOMIES FOR EPIDURAL ABSCESS;  Surgeon: Cannon Champion, MD;  Location: [facility];  Service: Neurosurgery;  Laterality: N/A;   TOTAL ABDOMINAL HYSTERECTOMY  02/1993   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   GERD (gastroesophageal reflux disease)    Hyperlipidemia    Hypertension    Lumbar stenosis    OSA (obstructive sleep apnea)    "couldn't handle CPAP; use mouth guard some; not all the time" (01/06/2014)   PAF (paroxysmal atrial fibrillation) (HCC)    Scoliosis    with radiculopathy L2-S1 with prior surgery   Small bowel obstruction (HCC)    versus ileus after last bck surgery   Spondylosis    BP 129/69   Pulse 93   Ht 5\' 6"  (1.676 m)   SpO2 94%   BMI 31.31 kg/m   Opioid Risk Score:   Fall Risk Score:  `1  Depression  screen [facility] 2/9     10/04/2023    2:42 PM 08/23/2023    3:04 PM 07/31/2019   10:43 AM 01/11/2019    2:38 PM  Depression screen PHQ 2/9  Decreased Interest 0 0 0 0  Down, Depressed, Hopeless 0 0 0 0  PHQ - 2 Score 0 0 0 0     Review of Systems  Musculoskeletal:  Positive for gait problem.  Hematological:  Bruises/bleeds easily.       Apixaban   All other systems reviewed and are negative.      Objective:   Physical Exam  General no acute distress Mood  affect appropriate Left lower extremity has 4/5 and left hip flexor knee extensor 0 at the ankle dorsiflexor plantar flexor she has hypertonicity at the foot inverters as well as ankle plantar flexors.   MAS 4, toe flexors are MAS 3 MSK No ankle effusion.  No ecchymosis around ankle small amount of soft tissue swelling No pain with ankle inversion eversion dorsiflexion or plantarflexion with stretching and get nearly to neutral with ankle dorsiflexion.       Assessment & Plan:   1.  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

## 2023-10-04 NOTE — Patient Instructions (Signed)
 Will repeat Botox  at a higher dose Anticipate ordering a left ankle-foot orthosis next visit

## 2023-11-15 ENCOUNTER — Ambulatory Visit: Admitting: Physical Medicine & Rehabilitation

## 2023-12-07 NOTE — Progress Notes (Signed)
 Assessment/Plan:   1.  PSP             -Patient understands differences between PSP and Parkinson's disease.  Understands that there is an increased risk of falls, aspiration as subsequent morbidity and mortality from this disease compared to Parkinson's disease.             -continue carbidopa /levodopa  2 at 8am/noon/4pm (still with facial dyskinesia)  -referral for ST.  She is quite hypophonic and mildly dysarthric as well.  - She really looks quite good now, both physically as well as from a mental standpoint.     2.  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  this is resolved   3.  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 Carilyn for left foot dystonia and receiving Botox  from him.  Her last Botox  was April 29.  She is not sure she is going to continue the Botox .  She is going to wait a new foot brace.  6.  Diplopia  -following with ophth  Subjective:   [name] was seen today in follow up for PSP.  My previous records were reviewed prior to todays visit as well as outside records available to me.  Pt with daughters who supplements hx.  Last visit, the facility where she was living had increased her levodopa .  It is unclear why that happened, and by went ahead and decreased her levodopa  back to 2 tablets 3 times per day.  She did well with.  She saw Dr. Carilyn on April 29 regarding her left foot dystonia and had Botox .  They are awaiting a brace for the foot [facility] orth[facility].  She does bingo to keep her mind active.  She has some word search books but doesn't use them.  She generally stays social with others in the facility.  Current prescribed movement disorder medications: Carbidopa /levodopa  25/100, 2 po 3 times per  day   PREVIOUS MEDICATIONS: Rasagiline  (confusion)  ALLERGIES:   Allergies  Allergen Reactions   Lyrica  [Pregabalin ] Other (See Comments)    Felt loopy   Zocor  [Simvastatin ] Other (See Comments)    Myalgias   Relafen [Nabumetone] Rash    CURRENT MEDICATIONS:  Outpatient Encounter Medications as of 12/13/2023  Medication Sig   acetaminophen  (TYLENOL ) 500 MG tablet Take 1,000 mg by mouth 2 (two) times daily.   B Complex-Folic Acid  (SUPER B COMPLEX MAXI) TABS Take 1 tablet by mouth daily.   capsicum (ZOSTRIX) 0.075 % topical cream Apply topically 2 (two) times daily.   carbidopa -levodopa  (SINEMET  IR) 25-100 MG tablet 3 at 8am/2 at noon/2 at 4pm x 2 weeks and then take 2 tablets at 8am/noon/4pm (Patient taking differently: Take 2 tablets by mouth 3 (three) times daily.  2 tablets at 8am/noon/4pm)   cetirizine (ZYRTEC) 10 MG tablet Take 10 mg by mouth daily.   Cholecalciferol 50 MCG (2000 UT) TABS Take 4,000 Units by mouth daily.   cilostazol (PLETAL) 50 MG tablet Take 50 mg by mouth 2 (two) times daily.   ELIQUIS  2.5 MG TABS tablet Take 2.5 mg by mouth 2 (  two) times daily.   flecainide  (TAMBOCOR ) 50 MG tablet Take 50 mg by mouth daily.   fluticasone (FLONASE) 50 MCG/ACT nasal spray Place 2 sprays into both nostrils daily.   furosemide  (LASIX ) 20 MG tablet TAKE 1 TABLET(20 MG) BY MOUTH DAILY   gabapentin  (NEURONTIN ) 300 MG capsule Take 2 capsules (600 mg total) by mouth 2 (two) times daily.   Lactobacillus Rhamnosus, GG, (CULTURELLE KIDS) PACK Take 1 tablet by mouth daily.   Lidocaine  HCl (ASPERCREME LIDOCAINE ) 4 % CREA Apply 1 Application topically 2 (two) times daily. Apply to both feet topically two times a day for pain   metoprolol  succinate (TOPROL -XL) 25 MG 24 hr tablet Take 12.5 mg by mouth daily.   Nutritional Supplements (BOOST ORIGINAL) LIQD Take 8 oz by mouth 2 (two) times daily.   Pollen Extracts (PROSTAT PO) Take 30 mLs by mouth 2 (two) times daily.   potassium chloride  SA  (K-DUR) 20 MEQ tablet Take 1 tablet (20 mEq total) by mouth daily. (Patient taking differently: Take 20 mEq by mouth 2 (two) times daily.)   pravastatin  (PRAVACHOL ) 40 MG tablet Take 40 mg by mouth at bedtime.   sulfamethoxazole -trimethoprim  (BACTRIM  DS) 800-160 MG tablet Take 1 tablet by mouth 2 (two) times daily.   traMADol  (ULTRAM ) 50 MG tablet Take 50 mg by mouth 4 (four) times daily.   TRAVATAN Z 0.004 % SOLN ophthalmic solution Place 1 drop into both eyes at bedtime.    No facility-administered encounter medications on file as of 12/13/2023.    Objective:   PHYSICAL EXAMINATION:    VITALS:   Vitals:   12/13/23 1436  BP: 118/68  Pulse: 75  SpO2: 97%     GEN:  The patient appears stated age and is in NAD.  She is intermittently tearful, but most of the time she is not.  This is similar to last visit HEENT:  Normocephalic, atraumatic.  The mucous membranes are moist. The superficial temporal arteries are without ropiness or tenderness. CV:  RRR Lungs:  CTAB Neck/HEME:  There are no carotid bruits bilaterally.  Neurological examination:  Orientation: The patient is alert and oriented x3. Cranial nerves: There is good facial symmetry with facial hypomimia.  She does have some square wave jerks.  Has downgaze paresis.   The speech is fluent and hypophonic and mildly dysarthric. Soft palate rises symmetrically and there is no tongue deviation. Hearing is intact to conversational tone. Sensation: Sensation is intact to light touch throughout Motor: Strength is at least antigravity x4.  She has left foot drop, and is awaiting a new foot brace.  Movement examination: Tone: There is normal tone in the upper extremities Abnormal movements: no dyskinesia Coordination:  There is mild decremation with RAM's, mostly on the L Gait and Station: not tested as not walking (plans to when she gets her brace soon)     I have reviewed and interpreted the following labs independently     Chemistry      Component Value Date/Time   NA 137 09/09/2023 0837   NA 145 (H) 07/13/2023 0842   K 4.0 09/09/2023 0837   CL 104 09/09/2023 0837   CO2 25 09/09/2023 0837   BUN 16 09/09/2023 0837   BUN 19 07/13/2023 0842   CREATININE 1.37 (H) 09/09/2023 0837   CREATININE 0.93 01/11/2019 1547      Component Value Date/Time   CALCIUM  8.7 (L) 09/09/2023 0837   ALKPHOS 94 09/06/2023 2359   AST 49 (H) 09/06/2023 2359  ALT 27 09/06/2023 2359   BILITOT 0.6 09/06/2023 2359       Lab Results  Component Value Date   WBC 6.7 09/09/2023   HGB 11.0 (L) 09/09/2023   HCT 34.2 (L) 09/09/2023   MCV 87.0 09/09/2023   PLT 315 09/09/2023    Lab Results  Component Value Date   TSH 1.298 12/25/2019     Total time spent on today's visit was 32 minutes, including both face-to-face time and nonface-to-face time.  Time included that spent on review of records (prior notes available to me/labs/imaging if pertinent), discussing treatment and goals, answering patient's questions and coordinating care.  Cc:  Yolande Toribio MATSU, MD

## 2023-12-13 ENCOUNTER — Encounter: Payer: Self-pay | Admitting: Neurology

## 2023-12-13 ENCOUNTER — Ambulatory Visit: Payer: PPO | Admitting: Neurology

## 2023-12-13 VITALS — BP 118/68 | HR 75

## 2023-12-13 DIAGNOSIS — R498 Other voice and resonance disorders: Secondary | ICD-10-CM

## 2023-12-13 DIAGNOSIS — G231 Progressive supranuclear ophthalmoplegia [Steele-Richardson-Olszewski]: Secondary | ICD-10-CM | POA: Diagnosis not present

## 2023-12-13 DIAGNOSIS — R471 Dysarthria and anarthria: Secondary | ICD-10-CM | POA: Diagnosis not present

## 2024-02-27 ENCOUNTER — Emergency Department (HOSPITAL_COMMUNITY)

## 2024-02-27 ENCOUNTER — Other Ambulatory Visit: Payer: Self-pay

## 2024-02-27 ENCOUNTER — Inpatient Hospital Stay (HOSPITAL_COMMUNITY)
Admission: EM | Admit: 2024-02-27 | Discharge: 2024-03-02 | DRG: 690 | Disposition: A | Discharge status: Skilled Nursing Facility | Source: Skilled Nursing Facility | Attending: Internal Medicine | Admitting: Internal Medicine

## 2024-02-27 DIAGNOSIS — E860 Dehydration: Secondary | ICD-10-CM | POA: Diagnosis present

## 2024-02-27 DIAGNOSIS — Z888 Allergy status to other drugs, medicaments and biological substances status: Secondary | ICD-10-CM

## 2024-02-27 DIAGNOSIS — I5032 Chronic diastolic (congestive) heart failure: Secondary | ICD-10-CM | POA: Diagnosis present

## 2024-02-27 DIAGNOSIS — B9620 Unspecified Escherichia coli [E. coli] as the cause of diseases classified elsewhere: Secondary | ICD-10-CM | POA: Diagnosis present

## 2024-02-27 DIAGNOSIS — N202 Calculus of kidney with calculus of ureter: Secondary | ICD-10-CM | POA: Diagnosis present

## 2024-02-27 DIAGNOSIS — J309 Allergic rhinitis, unspecified: Secondary | ICD-10-CM | POA: Diagnosis present

## 2024-02-27 DIAGNOSIS — G20C Parkinsonism, unspecified: Secondary | ICD-10-CM | POA: Diagnosis present

## 2024-02-27 DIAGNOSIS — N1831 Chronic kidney disease, stage 3a: Secondary | ICD-10-CM | POA: Diagnosis present

## 2024-02-27 DIAGNOSIS — Z7902 Long term (current) use of antithrombotics/antiplatelets: Secondary | ICD-10-CM

## 2024-02-27 DIAGNOSIS — Z87440 Personal history of urinary (tract) infections: Secondary | ICD-10-CM

## 2024-02-27 DIAGNOSIS — N136 Pyonephrosis: Secondary | ICD-10-CM | POA: Diagnosis present

## 2024-02-27 DIAGNOSIS — Z981 Arthrodesis status: Secondary | ICD-10-CM

## 2024-02-27 DIAGNOSIS — N132 Hydronephrosis with renal and ureteral calculous obstruction: Secondary | ICD-10-CM | POA: Diagnosis not present

## 2024-02-27 DIAGNOSIS — I130 Hypertensive heart and chronic kidney disease with heart failure and stage 1 through stage 4 chronic kidney disease, or unspecified chronic kidney disease: Secondary | ICD-10-CM | POA: Diagnosis present

## 2024-02-27 DIAGNOSIS — Z1152 Encounter for screening for COVID-19: Secondary | ICD-10-CM

## 2024-02-27 DIAGNOSIS — Z66 Do not resuscitate: Secondary | ICD-10-CM | POA: Diagnosis present

## 2024-02-27 DIAGNOSIS — I10 Essential (primary) hypertension: Secondary | ICD-10-CM | POA: Diagnosis not present

## 2024-02-27 DIAGNOSIS — N3000 Acute cystitis without hematuria: Principal | ICD-10-CM

## 2024-02-27 DIAGNOSIS — Z90710 Acquired absence of both cervix and uterus: Secondary | ICD-10-CM

## 2024-02-27 DIAGNOSIS — Z79899 Other long term (current) drug therapy: Secondary | ICD-10-CM | POA: Diagnosis not present

## 2024-02-27 DIAGNOSIS — G4733 Obstructive sleep apnea (adult) (pediatric): Secondary | ICD-10-CM | POA: Diagnosis present

## 2024-02-27 DIAGNOSIS — E785 Hyperlipidemia, unspecified: Secondary | ICD-10-CM | POA: Diagnosis present

## 2024-02-27 DIAGNOSIS — Z9049 Acquired absence of other specified parts of digestive tract: Secondary | ICD-10-CM

## 2024-02-27 DIAGNOSIS — N390 Urinary tract infection, site not specified: Secondary | ICD-10-CM | POA: Diagnosis present

## 2024-02-27 DIAGNOSIS — I700 Atherosclerosis of aorta: Secondary | ICD-10-CM | POA: Diagnosis present

## 2024-02-27 DIAGNOSIS — Z7901 Long term (current) use of anticoagulants: Secondary | ICD-10-CM | POA: Diagnosis not present

## 2024-02-27 DIAGNOSIS — K449 Diaphragmatic hernia without obstruction or gangrene: Secondary | ICD-10-CM | POA: Diagnosis present

## 2024-02-27 DIAGNOSIS — Z8249 Family history of ischemic heart disease and other diseases of the circulatory system: Secondary | ICD-10-CM | POA: Diagnosis not present

## 2024-02-27 DIAGNOSIS — G20A1 Parkinson's disease without dyskinesia, without mention of fluctuations: Secondary | ICD-10-CM | POA: Diagnosis present

## 2024-02-27 DIAGNOSIS — M48061 Spinal stenosis, lumbar region without neurogenic claudication: Secondary | ICD-10-CM | POA: Diagnosis present

## 2024-02-27 DIAGNOSIS — Z961 Presence of intraocular lens: Secondary | ICD-10-CM | POA: Diagnosis present

## 2024-02-27 DIAGNOSIS — K219 Gastro-esophageal reflux disease without esophagitis: Secondary | ICD-10-CM | POA: Diagnosis present

## 2024-02-27 DIAGNOSIS — G629 Polyneuropathy, unspecified: Secondary | ICD-10-CM | POA: Diagnosis present

## 2024-02-27 DIAGNOSIS — I480 Paroxysmal atrial fibrillation: Secondary | ICD-10-CM | POA: Diagnosis present

## 2024-02-27 DIAGNOSIS — N3080 Other cystitis without hematuria: Secondary | ICD-10-CM | POA: Diagnosis present

## 2024-02-27 DIAGNOSIS — I4891 Unspecified atrial fibrillation: Secondary | ICD-10-CM | POA: Diagnosis present

## 2024-02-27 DIAGNOSIS — B961 Klebsiella pneumoniae [K. pneumoniae] as the cause of diseases classified elsewhere: Secondary | ICD-10-CM | POA: Diagnosis present

## 2024-02-27 DIAGNOSIS — Z7401 Bed confinement status: Secondary | ICD-10-CM

## 2024-02-27 DIAGNOSIS — N201 Calculus of ureter: Secondary | ICD-10-CM

## 2024-02-27 DIAGNOSIS — Z8261 Family history of arthritis: Secondary | ICD-10-CM

## 2024-02-27 LAB — CBC WITH DIFFERENTIAL/PLATELET
Abs Immature Granulocytes: 0.04 K/uL (ref 0.00–0.07)
Basophils Absolute: 0 K/uL (ref 0.0–0.1)
Basophils Relative: 0 %
Eosinophils Absolute: 0.1 K/uL (ref 0.0–0.5)
Eosinophils Relative: 1 %
HCT: 39 % (ref 36.0–46.0)
Hemoglobin: 11.9 g/dL — ABNORMAL LOW (ref 12.0–15.0)
Immature Granulocytes: 0 %
Lymphocytes Relative: 12 %
Lymphs Abs: 1.1 K/uL (ref 0.7–4.0)
MCH: 26.7 pg (ref 26.0–34.0)
MCHC: 30.5 g/dL (ref 30.0–36.0)
MCV: 87.6 fL (ref 80.0–100.0)
Monocytes Absolute: 0.6 K/uL (ref 0.1–1.0)
Monocytes Relative: 7 %
Neutro Abs: 7.2 K/uL (ref 1.7–7.7)
Neutrophils Relative %: 80 %
Platelets: 186 K/uL (ref 150–400)
RBC: 4.45 MIL/uL (ref 3.87–5.11)
RDW: 15.5 % (ref 11.5–15.5)
WBC: 9 K/uL (ref 4.0–10.5)
nRBC: 0 % (ref 0.0–0.2)

## 2024-02-27 LAB — COMPREHENSIVE METABOLIC PANEL WITH GFR
ALT: 13 U/L (ref 0–44)
AST: 23 U/L (ref 15–41)
Albumin: 3.8 g/dL (ref 3.5–5.0)
Alkaline Phosphatase: 104 U/L (ref 38–126)
Anion gap: 11 (ref 5–15)
BUN: 27 mg/dL — ABNORMAL HIGH (ref 8–23)
CO2: 24 mmol/L (ref 22–32)
Calcium: 9.1 mg/dL (ref 8.9–10.3)
Chloride: 104 mmol/L (ref 98–111)
Creatinine, Ser: 1.58 mg/dL — ABNORMAL HIGH (ref 0.44–1.00)
GFR, Estimated: 33 mL/min — ABNORMAL LOW (ref >60)
Glucose, Bld: 121 mg/dL — ABNORMAL HIGH (ref 70–99)
Potassium: 4.4 mmol/L (ref 3.5–5.1)
Sodium: 139 mmol/L (ref 135–145)
Total Bilirubin: 0.5 mg/dL (ref 0.0–1.2)
Total Protein: 6.8 g/dL (ref 6.5–8.1)

## 2024-02-27 LAB — CREATININE, SERUM
Creatinine, Ser: 1.5 mg/dL — ABNORMAL HIGH (ref 0.44–1.00)
GFR, Estimated: 35 mL/min — ABNORMAL LOW (ref >60)

## 2024-02-27 LAB — URINALYSIS, MICROSCOPIC (REFLEX)

## 2024-02-27 LAB — URINALYSIS, ROUTINE W REFLEX MICROSCOPIC
Bilirubin Urine: NEGATIVE
Glucose, UA: NEGATIVE mg/dL
Ketones, ur: NEGATIVE mg/dL
Nitrite: NEGATIVE
Protein, ur: NEGATIVE mg/dL
Specific Gravity, Urine: <1.005 — ABNORMAL LOW (ref 1.005–1.030)
pH: 6.5 (ref 5.0–8.0)

## 2024-02-27 LAB — CBC
HCT: 38.4 % (ref 36.0–46.0)
Hemoglobin: 12.1 g/dL (ref 12.0–15.0)
MCH: 26.8 pg (ref 26.0–34.0)
MCHC: 31.5 g/dL (ref 30.0–36.0)
MCV: 85 fL (ref 80.0–100.0)
Platelets: 172 K/uL (ref 150–400)
RBC: 4.52 MIL/uL (ref 3.87–5.11)
RDW: 15.8 % — ABNORMAL HIGH (ref 11.5–15.5)
WBC: 7.6 K/uL (ref 4.0–10.5)
nRBC: 0 % (ref 0.0–0.2)

## 2024-02-27 LAB — LIPASE, BLOOD: Lipase: 24 U/L (ref 11–51)

## 2024-02-27 LAB — I-STAT CG4 LACTIC ACID, ED: Lactic Acid, Venous: 1.5 mmol/L (ref 0.5–1.9)

## 2024-02-27 LAB — RESP PANEL BY RT-PCR (RSV, FLU A&B, COVID)  RVPGX2
Influenza A by PCR: NEGATIVE
Influenza B by PCR: NEGATIVE
Resp Syncytial Virus by PCR: NEGATIVE
SARS Coronavirus 2 by RT PCR: NEGATIVE

## 2024-02-27 LAB — MAGNESIUM: Magnesium: 2.1 mg/dL (ref 1.7–2.4)

## 2024-02-27 MED ORDER — METOPROLOL SUCCINATE ER 25 MG PO TB24
12.5000 mg | ORAL_TABLET | Freq: Every day | ORAL | Status: DC
  Administered 2024-02-28 – 2024-03-02 (×4): 12.5 mg via ORAL
  Filled 2024-02-27 (×5): qty 1

## 2024-02-27 MED ORDER — PRAVASTATIN SODIUM 40 MG PO TABS
40.0000 mg | ORAL_TABLET | Freq: Every day | ORAL | Status: DC
  Administered 2024-02-28 – 2024-03-01 (×3): 40 mg via ORAL
  Filled 2024-02-27 (×4): qty 1

## 2024-02-27 MED ORDER — SODIUM CHLORIDE 0.9 % IV SOLN: 500.0000 mg | Freq: Three times a day (TID) | INTRAVENOUS | Status: DC

## 2024-02-27 MED ORDER — ACETAMINOPHEN 325 MG PO TABS: 650.0000 mg | ORAL_TABLET | Freq: Four times a day (QID) | ORAL | Status: DC | PRN

## 2024-02-27 MED ORDER — ONDANSETRON HCL 4 MG/2ML IJ SOLN
4.0000 mg | Freq: Four times a day (QID) | INTRAMUSCULAR | Status: DC | PRN
  Administered 2024-02-27: 4 mg via INTRAVENOUS
  Filled 2024-02-27: qty 2

## 2024-02-27 MED ORDER — ONDANSETRON HCL 4 MG PO TABS: 4.0000 mg | ORAL_TABLET | Freq: Four times a day (QID) | ORAL | Status: DC | PRN

## 2024-02-27 MED ORDER — HYDRALAZINE HCL 20 MG/ML IJ SOLN
10.0000 mg | Freq: Once | INTRAMUSCULAR | Status: AC
  Administered 2024-02-27: 10 mg via INTRAVENOUS
  Filled 2024-02-27: qty 1

## 2024-02-27 MED ORDER — SODIUM CHLORIDE 0.9 % IV SOLN
1.0000 g | INTRAVENOUS | Status: DC
  Administered 2024-02-28: 1 g via INTRAVENOUS
  Filled 2024-02-27: qty 10

## 2024-02-27 MED ORDER — ACETAMINOPHEN 500 MG PO TABS
1000.0000 mg | ORAL_TABLET | Freq: Once | ORAL | Status: AC
  Administered 2024-02-27: 1000 mg via ORAL
  Filled 2024-02-27: qty 2

## 2024-02-27 MED ORDER — IOHEXOL 350 MG/ML SOLN
75.0000 mL | Freq: Once | INTRAVENOUS | Status: AC | PRN
  Administered 2024-02-27: 75 mL via INTRAVENOUS

## 2024-02-27 MED ORDER — GABAPENTIN 300 MG PO CAPS
600.0000 mg | ORAL_CAPSULE | Freq: Two times a day (BID) | ORAL | Status: DC
  Administered 2024-02-28 – 2024-03-02 (×7): 600 mg via ORAL
  Filled 2024-02-27 (×8): qty 2

## 2024-02-27 MED ORDER — MORPHINE SULFATE (PF) 2 MG/ML IV SOLN
2.0000 mg | INTRAVENOUS | Status: DC | PRN
  Administered 2024-02-27 – 2024-03-01 (×2): 2 mg via INTRAVENOUS
  Filled 2024-02-27 (×2): qty 1

## 2024-02-27 MED ORDER — SODIUM CHLORIDE 0.9 % IV SOLN: 1.0000 g | INTRAVENOUS | Status: DC

## 2024-02-27 MED ORDER — ENOXAPARIN SODIUM 40 MG/0.4ML IJ SOSY
40.0000 mg | PREFILLED_SYRINGE | INTRAMUSCULAR | Status: DC
  Administered 2024-02-27: 40 mg via SUBCUTANEOUS
  Filled 2024-02-27: qty 0.4

## 2024-02-27 MED ORDER — CEFTRIAXONE SODIUM 2 G IJ SOLR
2.0000 g | Freq: Once | INTRAMUSCULAR | Status: AC
  Administered 2024-02-27: 2 g via INTRAVENOUS
  Filled 2024-02-27: qty 20

## 2024-02-27 MED ORDER — ACETAMINOPHEN 650 MG RE SUPP: 650.0000 mg | Freq: Four times a day (QID) | RECTAL | Status: DC | PRN

## 2024-02-27 MED ORDER — HYDRALAZINE HCL 20 MG/ML IJ SOLN
10.0000 mg | INTRAMUSCULAR | Status: DC | PRN
  Filled 2024-02-27: qty 1

## 2024-02-27 MED ORDER — LACTATED RINGERS IV BOLUS
500.0000 mL | Freq: Once | INTRAVENOUS | Status: AC
  Administered 2024-02-27: 500 mL via INTRAVENOUS

## 2024-02-27 MED ORDER — CARBIDOPA-LEVODOPA 25-100 MG PO TABS
2.0000 | ORAL_TABLET | ORAL | Status: DC
  Administered 2024-02-28 – 2024-03-02 (×10): 2 via ORAL
  Filled 2024-02-27 (×10): qty 2

## 2024-02-27 NOTE — ED Provider Notes (Signed)
 [place] EMERGENCY DEPARTMENT AT [facility] Provider Note   CSN: [id number] Arrival date & time: 02/27/24  1255     Patient presents with: Abdominal Pain   [name] is a 83 y.o. female.    Abdominal Pain  83 year old female with PMH of progressive supranuclear palsy, atrial fibrillation on Eliquis , prior osteomyelitis/discitis with epidural abscess, OSA on CPAP, HTN, CKD stage IIIa, GERD, and SBO presenting to the ED via EMS from SNF/[facility] with chief complaint of x 2 days of generalized abdominal pain, worsening RLQ with accompanying nausea, NBNB emesis, loose stools, and subjective fevers.  Patient reports 2X episodes of NBNB emesis and 4X episodes of loose stool today.  She denies any changes in urination or just comfort with urination.  Patient denies headache, vision changes, chest pain, shortness of breath.   Prior to Admission medications   Medication Sig Start Date End Date Taking? Authorizing Provider  acetaminophen  (TYLENOL ) 500 MG tablet Take 1,000 mg by mouth 2 (two) times daily.    [provider]  B Complex-Folic Acid  (SUPER B COMPLEX MAXI) TABS Take 1 tablet by mouth daily.    [provider]  capsicum (ZOSTRIX) 0.075 % topical cream Apply topically 2 (two) times daily. 09/09/23   Arlon Carliss ORN, DO  carbidopa -levodopa  (SINEMET  IR) 25-100 MG tablet 3 at 8am/2 at noon/2 at 4pm x 2 weeks and then take 2 tablets at 8am/noon/4pm Patient taking differently: Take 2 tablets by mouth 3 (three) times daily.  2 tablets at 8am/noon/4pm 11/22/22   Tat, Asberry RAMAN, DO  cetirizine (ZYRTEC) 10 MG tablet Take 10 mg by mouth daily.    [provider]  Cholecalciferol  50 MCG (2000 UT) TABS Take 4,000 Units by mouth daily.    [provider]  cilostazol  (PLETAL ) 50 MG tablet Take 50 mg by mouth 2 (two) times daily.    [provider]  ELIQUIS  2.5 MG TABS tablet Take 2.5 mg by mouth 2 (two) times daily.  02/27/22   [provider]  flecainide  (TAMBOCOR ) 50 MG tablet Take 50 mg by mouth daily.    [provider]  fluticasone  (FLONASE ) 50 MCG/ACT nasal spray Place 2 sprays into both nostrils daily. 08/29/23   [provider]  furosemide  (LASIX ) 20 MG tablet TAKE 1 TABLET(20 MG) BY MOUTH DAILY 04/28/20   Bhagat, [place], PA  gabapentin  (NEURONTIN ) 300 MG capsule Take 2 capsules (600 mg total) by mouth 2 (two) times daily. 09/09/23   Arlon Carliss ORN, DO  Lactobacillus Rhamnosus, GG, (CULTURELLE KIDS) PACK Take 1 tablet by mouth daily.    [provider]  Lidocaine  HCl (ASPERCREME LIDOCAINE ) 4 % CREA Apply 1 Application topically 2 (two) times daily. Apply to both feet topically two times a day for pain    [provider]  metoprolol  succinate (TOPROL -XL) 25 MG 24 hr tablet Take 12.5 mg by mouth daily.    [provider]  Nutritional Supplements (BOOST ORIGINAL) LIQD Take 8 oz by mouth 2 (two) times daily.    [provider]  Pollen Extracts (PROSTAT PO) Take 30 mLs by mouth 2 (two) times daily.    [provider]  potassium chloride  SA (K-DUR) 20 MEQ tablet Take 1 tablet (20 mEq total) by mouth daily. Patient taking differently: Take 20 mEq by mouth 2 (two) times daily. 01/01/19   Bhagat, Aleene, PA  pravastatin  (PRAVACHOL ) 40 MG tablet Take 40 mg by mouth at bedtime.    [provider]  sulfamethoxazole -trimethoprim  (BACTRIM  DS) 800-160 MG tablet Take 1 tablet by mouth 2 (two) times daily. 09/09/23   Arlon Carliss ORN, DO  traMADol  (ULTRAM ) 50 MG tablet Take 50 mg by mouth 4 (four) times daily. 08/15/23   [provider]  TRAVATAN Z 0.004 % SOLN ophthalmic solution Place 1 drop into both eyes at bedtime.  01/21/15   [provider]    Allergies: Lyrica  [pregabalin ], Zocor  [simvastatin ], and Relafen [nabumetone]    Review of Systems  Gastrointestinal:  Positive for abdominal pain.    Updated Vital  Signs BP (!) 211/83   Pulse 88   Temp 98.8 F (37.1 C) (Oral)   Resp 11   SpO2 100%   Physical Exam HENT:     Head: Normocephalic and atraumatic.     Mouth/Throat:     Pharynx: Oropharynx is clear.     Comments: Dry oral mucosa Eyes:     Extraocular Movements: Extraocular movements intact.     Pupils: Pupils are equal, round, and reactive to light.  Cardiovascular:     Rate and Rhythm: Normal rate and regular rhythm.     Heart sounds: Normal heart sounds.  Pulmonary:     Effort: Pulmonary effort is normal.     Breath sounds: Normal breath sounds.  Abdominal:     General: Abdomen is flat.     Palpations: Abdomen is soft.     Tenderness: There is abdominal tenderness in the right lower quadrant and left lower quadrant. There is no guarding or rebound.  Skin:    General: Skin is warm and dry.     Capillary Refill: Capillary refill takes 2 to 3 seconds.  Neurological:     General: No focal deficit present.     Mental Status: She is alert and oriented to person, place, and time.     (all labs ordered are listed, but only abnormal results are displayed) Labs Reviewed  CBC WITH DIFFERENTIAL/PLATELET - Abnormal; Notable for the following components:      Result Value   Hemoglobin 11.9 (*)    All other components within normal limits  COMPREHENSIVE METABOLIC PANEL WITH GFR - Abnormal; Notable for the following components:   Glucose, Bld 121 (*)    BUN 27 (*)    Creatinine, Ser 1.58 (*)    GFR, Estimated 33 (*)    All other components within normal limits  URINALYSIS, ROUTINE W REFLEX MICROSCOPIC - Abnormal; Notable for the following components:   APPearance HAZY (*)    Specific Gravity, Urine <1.005 (*)    Hgb urine dipstick TRACE (*)    Leukocytes,Ua MODERATE (*)    All other components within normal limits  URINALYSIS, MICROSCOPIC (REFLEX) - Abnormal; Notable for the following components:   Bacteria, UA FEW (*)    All other components within normal limits  RESP PANEL  BY RT-PCR (RSV, FLU A&B, COVID)  RVPGX2  LIPASE, BLOOD  MAGNESIUM   I-STAT CG4 LACTIC ACID, ED    EKG: None  Radiology: CT ABDOMEN PELVIS W CONTRAST Result Date: 02/27/2024 CLINICAL DATA:  Acute nonlocalized abdominal pain. Nausea and vomiting. EXAM: CT ABDOMEN AND PELVIS WITH CONTRAST TECHNIQUE: Multidetector CT imaging of the abdomen and pelvis was performed using the standard protocol following bolus administration of intravenous contrast. RADIATION DOSE REDUCTION: This exam was performed according to the departmental dose-optimization program which includes automated exposure control, adjustment of the mA and/or kV according to patient size and/or use of iterative reconstruction technique. CONTRAST:  75mL  OMNIPAQUE  IOHEXOL  350 MG/ML SOLN COMPARISON:  MRI abdomen 09/07/2023. CT abdomen and pelvis 09/06/2023. FINDINGS: Lower chest: Atelectasis or scarring in the lung bases. Cardiac enlargement. Small esophageal hiatal hernia. Hepatobiliary: No focal liver abnormality is seen. No gallstones, gallbladder wall thickening, or biliary dilatation. Pancreas: Unremarkable. No pancreatic ductal dilatation or surrounding inflammatory changes. Spleen: Normal in size without focal abnormality. Adrenals/Urinary Tract: No adrenal gland nodules. Marked hydronephrosis on the left, similar to prior study. Several peripheral calcifications are also unchanged no wall thickening or stranding is seen today. The ureter is decompressed. Right kidney appears normal. There is diffuse bladder wall thickening with gas in the bladder. This could represent cystitis with gas-forming organism or it could indicate sequela of prior catheterization. Gas was present previously in the bladder as well. Stomach/Bowel: Stomach, small bowel, and colon are mostly decompressed. No wall thickening or inflammatory stranding appreciated. The appendix is normal. Diverticulosis of the sigmoid colon without evidence of acute diverticulitis.  Vascular/Lymphatic: Aortic atherosclerosis. No enlarged abdominal or pelvic lymph nodes. Reproductive: Status post hysterectomy. No adnexal masses. Other: No abdominal wall hernia or abnormality. No abdominopelvic ascites. Musculoskeletal: Postoperative changes with posterior laminectomies and fusion from the lower thoracic spine through the sacrum. Diffuse bone demineralization. No acute fracture is identified. Curvilinear calcification in the superior right femoral head may indicate early avascular necrosis. IMPRESSION: 1. 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 2. Nonobstructing intrarenal stones. 3. Bladder wall thickening with gas in the bladder likely indicating cystitis. Correlate with urinalysis. 4. Small esophageal hiatal hernia. 5. Diffuse aortic atherosclerosis. Electronically Signed   By: Elsie Gravely M.D.   On: 02/27/2024 17:46    Procedures   Medications Ordered in the ED  cefTRIAXone  (ROCEPHIN ) 2 g in sodium chloride  0.9 % 100 mL IVPB (2 g Intravenous New Bag/Given 02/27/24 1805)  acetaminophen  (TYLENOL ) tablet 1,000 mg (has no administration in time range)  lactated ringers  bolus 500 mL (500 mLs Intravenous New Bag/Given 02/27/24 1658)  iohexol  (OMNIPAQUE ) 350 MG/ML injection 75 mL (75 mLs Intravenous Contrast Given 02/27/24 1714)    Clinical Course as of 02/27/24 1818  Mon Feb 27, 2024  1646 [ ]  UA [ ]  CT AP [WB]  1749 UA with few bacteria and moderate leukocytes.  Negative nitrites.  pH less than 1.005. 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  Intrarenal stones noted.   [WB]  1754 Plan for admission with ceftriaxone .  Urology consultation? [WB]    Clinical Course User Index [WB] Jaiyla Granados, Elsie, MD                                 Medical Decision Making Amount and/or Complexity of  Data Reviewed Labs: ordered.   83 year old female with PMH as above presenting to the ED with x 2 days of generalized abdominal pain, worse in the lower quadrants, with associated NBNB emesis and loose stools with subjective fevers.  Presenting from SNF.  Upon arrival, patient hypertensive 160/57.  Afebrile.  No tachycardia or tachypnea.  Saturating 100% on RA.  A&O x 4.  Patient clinically mildly dehydrated with 3-second capillary refill and dry oromucosa.  500 cc LR given.  Patient denying active nausea at this time with antiemetics deferred.  On chart review, patient with history of left sided hydronephrosis and UPJ obstruction following with  urology.  Recently diagnosed UTI and course completion of outpatient antibiotics.  Creatinine 1.58, BUN 27 with eGFR 33 -elevated from baseline, but subdiagnostic for AKI.  Lactate 1.5.  CBC without leukocytosis.  Hemoglobin stable at 11.9.  CT AP concerning for nonspecific changes consistent with cystitis in setting of infectious UA with few bacteria and moderate leukocytes.  Negative nitrites.  pH <1.005.  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  On chart review, no recent urine cultures to date.  Discussed with hospitalist service, patient to be admitted for outpatient antibiotic failure with UTI and intrarenal stones.  Patient given 2 g IV ceftriaxone .  Patient given 1 g Tylenol  for analgesia for symptomatic improvement upon reassessment.  Final diagnoses:  Acute cystitis without hematuria  Ureterolithiasis    ED Discharge Orders     None          Ambra Haverstick, Elsie, MD 02/27/24 [place]    Randol Simmonds, MD 02/28/24 1249

## 2024-02-27 NOTE — ED Triage Notes (Signed)
 Patient BIB [facility] from [facility] for abd pain, nausea, vomitting x 1 episode, and diarrhea x 1 episode this morning. Patient dx with UTI x 2 weeks ago, finished abx 1 week ago, now had sudden onset of LLQ pain. Patient is A&Ox4 but bed bound, reports this feels like previous UTIS but denies urine changes.  BP 200/98 (patient threw up BP meds) HR 90 98% RA CBG 127 RR 16

## 2024-02-27 NOTE — ED Notes (Signed)
 Patient transported to CT

## 2024-02-27 NOTE — ED Notes (Signed)
MD messaged regarding BP

## 2024-02-27 NOTE — ED Provider Triage Note (Signed)
 Emergency Medicine Provider Triage Evaluation Note  [name] , a 83 y.o. female  was evaluated in triage.  Pt complains of abd pain, nausea, vomitting x 1 episode, and diarrhea x 1 episode this morning.  Patient is from [facility].  Patient is unsure if she was feeling okay yesterday.  No fevers reported.  Per EMS report, recently treated for UTI.  Review of Systems  Positive: Nausea, vomiting, diarrhea Negative:   Physical Exam  There were no vitals taken for this visit. Gen:   Awake, no distress   Resp:  Normal effort  MSK:   Moves extremities without difficulty  Other:  Mild diffuse tenderness of abdomen on exam, no rebound or guarding  Medical Decision Making  Medically screening exam initiated at 1:32 PM.  Appropriate orders placed.  [name] was informed that the remainder of the evaluation will be completed by another provider, this initial triage assessment does not replace that evaluation, and the importance of remaining in the ED until their evaluation is complete.  Labs and CT ordered   [name], [name] 02/27/24 2203

## 2024-02-27 NOTE — H&P (Signed)
 History and Physical    Patient: [name] FMW:[id number] DOB: Oct 03, 1940 DOA: 02/27/2024 DOS: the patient was seen and examined on 02/27/2024 PCP: Yolande Toribio MATSU, MD  Patient coming from: SNF  Chief Complaint:  Chief Complaint  Patient presents with   Abdominal Pain   HPI: [name] is a 83 y.o. female with medical history significant of obstructive sleep apnea, GERD, essential hypertension, hyperlipidemia, lumbar stenosis, paroxysmal atrial fibrillation, previous UTI, diastolic CHF, history of epidural abscess who presented to the ER with significant abdominal pain.  Patient also had nausea with vomiting 1 episode.  Also an episode of diarrhea.  She was diagnosed with UTI about 2 weeks ago and finished antibiotics about a week ago.  She now started having left lower quadrant pain.  She is bedbound status with significant neuropathy.  Family reported that patient had previous ESBL UTI.  My review however showed no evidence of that.  The last culture we have here was no growth.  Patient was seen and evaluated in the ER.  So far her evaluation indicated that she has another UTI.  As a result patient has been admitted to the hospital for further evaluation and treatment  Review of Systems: As mentioned in the history of present illness. All other systems reviewed and are negative. Past Medical History:  Diagnosis Date   Allergic rhinitis    Arthritis    all over   CHF (congestive heart failure) (HCC)    Chronic back pain    DJD (degenerative joint disease)    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   GERD (gastroesophageal reflux disease)    Hyperlipidemia    Hypertension    Lumbar stenosis    OSA (obstructive sleep apnea)    couldn't handle CPAP; use mouth guard some; not all the time (01/06/2014)   PAF (paroxysmal atrial fibrillation) (HCC)    Scoliosis    with radiculopathy L2-S1 with prior surgery   Small bowel obstruction (HCC)    versus ileus after last bck surgery    Spondylosis    Past Surgical History:  Procedure Laterality Date   BACK SURGERY     CATARACT EXTRACTION W/ INTRAOCULAR LENS  IMPLANT, BILATERAL  2011   CHOLECYSTECTOMY N/A 07/07/2018   Procedure: LAPAROSCOPIC CHOLECYSTECTOMY;  Surgeon: Vanderbilt Ned, MD;  Location: [facility] OR;  Service: General;  Laterality: N/A;   COLONOSCOPY  12/09/2004   LIPOMA EXCISION  1980's   fatty tumors   LUMBAR DISC SURGERY  02/2009   ruptured disc   NASAL SEPTUM SURGERY  80's   POSTERIOR LUMBAR FUSION  06/2010; 10/2011   placed screws, rods, spacers both times   POSTERIOR LUMBAR FUSION 4 LEVEL N/A 12/13/2018   Procedure: THORACIC NINE -LUMBAR THREE POSTERIOR INSTRUMENTATION FUSION;  Surgeon: Cheryle Ned LABOR, MD;  Location: [facility] OR;  Service: Neurosurgery;  Laterality: N/A;   REPAIR DURAL / CSF LEAK  02/2009   THORACIC LAMINECTOMY FOR EPIDURAL ABSCESS N/A 09/28/2018   Procedure: THORACIC NINE-THORACIC TEN, THORACIC TEN-THORACIC ELEVEN, THORACIC ELEVEN-THORACIC TWELVE LAMINECTOMIES FOR EPIDURAL ABSCESS;  Surgeon: Cheryle Ned LABOR, MD;  Location: [facility] OR;  Service: Neurosurgery;  Laterality: N/A;   TOTAL ABDOMINAL HYSTERECTOMY  02/1993   Social History:  reports that she has never smoked. She has never used smokeless tobacco. She reports that she does not drink alcohol  and does not use drugs.  Allergies  Allergen Reactions   Lyrica  [Pregabalin ] Other (See Comments)    Felt loopy   Zocor  [Simvastatin ] Other (See Comments)  Myalgias   Relafen [Nabumetone] Rash    Family History  Problem Relation Age of Onset   Arthritis Mother    Heart attack Father    Hypertension Sister    Lung cancer Sister    Asthma Sister    Allergies Sister     Prior to Admission medications   Medication Sig Start Date End Date Taking? Authorizing Provider  acetaminophen  (TYLENOL ) 500 MG tablet Take 1,000 mg by mouth 2 (two) times daily.    [provider]  B Complex-Folic Acid  (SUPER B COMPLEX MAXI) TABS Take 1  tablet by mouth daily.    [provider]  capsicum (ZOSTRIX) 0.075 % topical cream Apply topically 2 (two) times daily. 09/09/23   Arlon Carliss ORN, DO  carbidopa -levodopa  (SINEMET  IR) 25-100 MG tablet 3 at 8am/2 at noon/2 at 4pm x 2 weeks and then take 2 tablets at 8am/noon/4pm Patient taking differently: Take 2 tablets by mouth 3 (three) times daily.  2 tablets at 8am/noon/4pm 11/22/22   Tat, Asberry RAMAN, DO  cetirizine (ZYRTEC) 10 MG tablet Take 10 mg by mouth daily.    [provider]  Cholecalciferol  50 MCG (2000 UT) TABS Take 4,000 Units by mouth daily.    [provider]  cilostazol  (PLETAL ) 50 MG tablet Take 50 mg by mouth 2 (two) times daily.    [provider]  ELIQUIS  2.5 MG TABS tablet Take 2.5 mg by mouth 2 (two) times daily. 02/27/22   [provider]  flecainide  (TAMBOCOR ) 50 MG tablet Take 50 mg by mouth daily.    [provider]  fluticasone  (FLONASE ) 50 MCG/ACT nasal spray Place 2 sprays into both nostrils daily. 08/29/23   [provider]  furosemide  (LASIX ) 20 MG tablet TAKE 1 TABLET(20 MG) BY MOUTH DAILY 04/28/20   Bhagat, [place], PA  gabapentin  (NEURONTIN ) 300 MG capsule Take 2 capsules (600 mg total) by mouth 2 (two) times daily. 09/09/23   Arlon Carliss ORN, DO  Lactobacillus Rhamnosus, GG, (CULTURELLE KIDS) PACK Take 1 tablet by mouth daily.    [provider]  Lidocaine  HCl (ASPERCREME LIDOCAINE ) 4 % CREA Apply 1 Application topically 2 (two) times daily. Apply to both feet topically two times a day for pain    [provider]  metoprolol  succinate (TOPROL -XL) 25 MG 24 hr tablet Take 12.5 mg by mouth daily.    [provider]  Nutritional Supplements (BOOST ORIGINAL) LIQD Take 8 oz by mouth 2 (two) times daily.    [provider]  Pollen Extracts (PROSTAT PO) Take 30 mLs by mouth 2 (two) times daily.    [provider]  potassium chloride  SA (K-DUR) 20 MEQ tablet Take 1  tablet (20 mEq total) by mouth daily. Patient taking differently: Take 20 mEq by mouth 2 (two) times daily. 01/01/19   Bhagat, Aleene, PA  pravastatin  (PRAVACHOL ) 40 MG tablet Take 40 mg by mouth at bedtime.    [provider]  sulfamethoxazole -trimethoprim  (BACTRIM  DS) 800-160 MG tablet Take 1 tablet by mouth 2 (two) times daily. 09/09/23   Arlon Carliss ORN, DO  traMADol  (ULTRAM ) 50 MG tablet Take 50 mg by mouth 4 (four) times daily. 08/15/23   [provider]  TRAVATAN Z 0.004 % SOLN ophthalmic solution Place 1 drop into both eyes at bedtime.  01/21/15   [provider]    Physical Exam: Vitals:   02/27/24 1810 02/27/24 1830 02/27/24 1847 02/27/24 1900  BP: (!) 211/83 (!) 220/87 (!) 186/70 ROLLEN)  131/97  Pulse: 88 90  98  Resp: 11 14  18   Temp:      TempSrc:      SpO2: 100% 99%  100%   Constitutional: Chronically ill looking, bedbound status mild distress due to pain  Eyes: PERRL, lids and conjunctivae normal ENMT: Mucous membranes are dry. Posterior pharynx clear of any exudate or lesions.Normal dentition.  Neck: normal, supple, no masses, no thyromegaly Respiratory: clear to auscultation bilaterally, no wheezing, no crackles. Normal respiratory effort. No accessory muscle use.  Cardiovascular: Regular rate and rhythm, no murmurs / rubs / gallops. No extremity edema. 2+ pedal pulses. No carotid bruits.  Abdomen: no tenderness, no masses palpated. No hepatosplenomegaly. Bowel sounds positive.  Musculoskeletal: Good range of motion, no joint swelling or tenderness, Skin: no rashes, lesions, ulcers. No induration Neurologic: CN 2-12 grossly intact. Sensation intact, DTR normal. Strength 5/5 in all 4.  Psychiatric: Confused, communicating,  Data Reviewed:  Temperature 99.1, blood pressure 220/87, pulse 104 respirate 24, glucose 121, creatinine 1.58, acute viral screen is negative for flu COVID and RSV.  Urinalysis showed moderate leukocytes, WBC 21-50, few  bacteria  Assessment and Plan:  #1 incompletely treated UTI: Patient is symptomatic.  Urinalysis is not very impressive not febrile and no leukocytosis however patient is weak.  Will admit the patient.  Initiate IV Rocephin .  Recent urine cultures and follow results.  #2 generalized weakness and bedbound status: Patient has been weak.  From a nursing home.  This is chronic.  Continue supportive care.  #3 hyperlipidemia: Continue with home regimen.  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  Initiate IV hydralazine  and monitor.    Advance Care Planning:   Code Status: Prior DNR limited  Consults: None  Family Communication: 2 daughters at bedside  Severity of Illness: The appropriate patient status for this patient is INPATIENT. Inpatient status is judged to be reasonable and necessary in order to provide the required intensity of service to ensure the patient's safety. The patient's presenting symptoms, physical exam findings, and initial radiographic and laboratory data in the context of their chronic comorbidities is felt to place them at high risk for further clinical deterioration. Furthermore, it is not anticipated that the patient will be medically stable for discharge from the hospital within 2 midnights of admission.   * I certify that at the point of admission it is my clinical judgment that the patient will require inpatient hospital care spanning beyond 2 midnights from the point of admission due to high intensity of service, high risk for further deterioration and high frequency of surveillance required.*  AuthorBETHA SIM KNOLL, MD 02/27/2024 7:23 PM  For on call review [email or web address].

## 2024-02-28 ENCOUNTER — Encounter (HOSPITAL_COMMUNITY): Payer: Self-pay | Admitting: Internal Medicine

## 2024-02-28 LAB — CBC
HCT: 39.2 % (ref 36.0–46.0)
Hemoglobin: 12.2 g/dL (ref 12.0–15.0)
MCH: 27.1 pg (ref 26.0–34.0)
MCHC: 31.1 g/dL (ref 30.0–36.0)
MCV: 87.1 fL (ref 80.0–100.0)
Platelets: 155 K/uL (ref 150–400)
RBC: 4.5 MIL/uL (ref 3.87–5.11)
RDW: 15.8 % — ABNORMAL HIGH (ref 11.5–15.5)
WBC: 6.6 K/uL (ref 4.0–10.5)
nRBC: 0 % (ref 0.0–0.2)

## 2024-02-28 LAB — COMPREHENSIVE METABOLIC PANEL WITH GFR
ALT: 17 U/L (ref 0–44)
AST: 16 U/L (ref 15–41)
Albumin: 3.6 g/dL (ref 3.5–5.0)
Alkaline Phosphatase: 93 U/L (ref 38–126)
Anion gap: 13 (ref 5–15)
BUN: 21 mg/dL (ref 8–23)
CO2: 21 mmol/L — ABNORMAL LOW (ref 22–32)
Calcium: 9.1 mg/dL (ref 8.9–10.3)
Chloride: 108 mmol/L (ref 98–111)
Creatinine, Ser: 1.4 mg/dL — ABNORMAL HIGH (ref 0.44–1.00)
GFR, Estimated: 38 mL/min — ABNORMAL LOW (ref >60)
Glucose, Bld: 86 mg/dL (ref 70–99)
Potassium: 4.2 mmol/L (ref 3.5–5.1)
Sodium: 142 mmol/L (ref 135–145)
Total Bilirubin: 0.5 mg/dL (ref 0.0–1.2)
Total Protein: 6.8 g/dL (ref 6.5–8.1)

## 2024-02-28 MED ORDER — FLECAINIDE ACETATE 50 MG PO TABS
50.0000 mg | ORAL_TABLET | Freq: Every day | ORAL | Status: DC
  Administered 2024-02-28 – 2024-03-02 (×4): 50 mg via ORAL
  Filled 2024-02-28 (×4): qty 1

## 2024-02-28 MED ORDER — PANTOPRAZOLE SODIUM 40 MG PO TBEC
40.0000 mg | DELAYED_RELEASE_TABLET | Freq: Every day | ORAL | Status: DC
Start: 2024-02-28 — End: 2024-03-02
  Administered 2024-02-28 – 2024-03-01 (×3): 40 mg via ORAL
  Filled 2024-02-28 (×3): qty 1

## 2024-02-28 MED ORDER — LATANOPROST 0.005 % OP SOLN
1.0000 [drp] | Freq: Every day | OPHTHALMIC | Status: DC
  Administered 2024-02-29 – 2024-03-01 (×2): 1 [drp] via OPHTHALMIC
  Filled 2024-02-28 (×2): qty 2.5

## 2024-02-28 MED ORDER — BOOST / RESOURCE BREEZE PO LIQD CUSTOM
237.0000 mL | Freq: Two times a day (BID) | ORAL | Status: DC
  Administered 2024-02-28: 237 mL via ORAL
  Administered 2024-03-02: 1 via ORAL

## 2024-02-28 MED ORDER — CAPSAICIN 0.075 % EX CREA
TOPICAL_CREAM | Freq: Two times a day (BID) | CUTANEOUS | Status: DC
  Filled 2024-02-28: qty 57

## 2024-02-28 MED ORDER — APIXABAN 2.5 MG PO TABS
2.5000 mg | ORAL_TABLET | Freq: Two times a day (BID) | ORAL | Status: DC
Start: 2024-02-28 — End: 2024-03-02
  Administered 2024-02-28 – 2024-03-02 (×7): 2.5 mg via ORAL
  Filled 2024-02-28 (×7): qty 1

## 2024-02-28 MED ORDER — VITAMIN D 25 MCG (1000 UNIT) PO TABS
4000.0000 [IU] | ORAL_TABLET | Freq: Every day | ORAL | Status: DC
  Administered 2024-02-28 – 2024-03-02 (×4): 4000 [IU] via ORAL
  Filled 2024-02-28 (×4): qty 4

## 2024-02-28 MED ORDER — ACETAMINOPHEN 500 MG PO TABS
1000.0000 mg | ORAL_TABLET | Freq: Two times a day (BID) | ORAL | Status: DC
  Administered 2024-02-28 – 2024-03-02 (×7): 1000 mg via ORAL
  Filled 2024-02-28 (×7): qty 2

## 2024-02-28 MED ORDER — CILOSTAZOL 50 MG PO TABS
50.0000 mg | ORAL_TABLET | Freq: Two times a day (BID) | ORAL | Status: DC
Start: 2024-02-28 — End: 2024-03-02
  Administered 2024-02-28 – 2024-03-02 (×7): 50 mg via ORAL
  Filled 2024-02-28 (×8): qty 1

## 2024-02-28 MED ORDER — MUSCLE RUB 10-15 % EX CREA
1.0000 | TOPICAL_CREAM | Freq: Two times a day (BID) | CUTANEOUS | Status: DC
  Administered 2024-02-28 – 2024-03-02 (×7): 1 via TOPICAL
  Filled 2024-02-28: qty 85

## 2024-02-28 MED ORDER — OXYCODONE HCL 5 MG PO TABS
5.0000 mg | ORAL_TABLET | Freq: Four times a day (QID) | ORAL | Status: DC | PRN
Start: 2024-02-28 — End: 2024-03-02
  Administered 2024-02-29 – 2024-03-02 (×3): 5 mg via ORAL
  Filled 2024-02-28 (×3): qty 1

## 2024-02-28 MED ORDER — FLUTICASONE PROPIONATE 50 MCG/ACT NA SUSP
2.0000 | Freq: Every day | NASAL | Status: DC
  Administered 2024-02-28 – 2024-03-02 (×3): 2 via NASAL
  Filled 2024-02-28: qty 16

## 2024-02-28 MED ORDER — SUPER B COMPLEX MAXI PO TABS: 1.0000 | ORAL_TABLET | Freq: Every day | ORAL | Status: DC

## 2024-02-28 MED ORDER — CULTURELLE KIDS PO PACK: 1.0000 | PACK | Freq: Every day | ORAL | Status: DC

## 2024-02-28 NOTE — TOC Initial Note (Addendum)
 Transition of Care [facility]) - Initial/Assessment Note    Patient Details  Name: [name] MRN: [id number] Date of Birth: 1941/05/09  Transition of Care [facility]) CM/SW Contact:    Isaiah Public, LCSWA Phone Number: 02/28/2024, 1:55 PM  Clinical Narrative:                  Patient is from [facility]. Randine with [facility] confirmed facility can accept patient back when medically ready. Due to patients current orientation,CSW LVM for patients daughter Suzen. CSW awaiting call back. CSW will continue to follow.  Update- CSW received call back from patients daughter Luke who confirmed patient comes from [facility] and that plan is for patient to return when medically ready for dc. All questions answered. No further questions reported at this time.       Patient Goals and CMS Choice            Expected Discharge Plan and Services                                              Prior Living Arrangements/Services                       Activities of Daily Living   ADL Screening (condition at time of admission) Independently performs ADLs?: No Does the patient have a NEW difficulty with bathing/dressing/toileting/self-feeding that is expected to last >3 days?: Yes (Initiates electronic notice to provider for possible OT consult) Does the patient have a NEW difficulty with getting in/out of bed, walking, or climbing stairs that is expected to last >3 days?: Yes (Initiates electronic notice to provider for possible PT consult) Does the patient have a NEW difficulty with communication that is expected to last >3 days?: No Is the patient deaf or have difficulty hearing?: No Does the patient have difficulty seeing, even when wearing glasses/contacts?: No Does the patient have difficulty concentrating, remembering, or making decisions?: Yes  Permission Sought/Granted                  Emotional Assessment              Admission diagnosis:   Dehydration [E86.0] Ureterolithiasis [N20.1] UTI (urinary tract infection) [N39.0] Acute cystitis without hematuria [N30.00] Patient Active Problem List   Diagnosis Date Noted   Essential hypertension 02/27/2024   Hydronephrosis of left kidney 09/07/2023   Hydronephrosis 09/06/2023   Dysphagia 09/06/2023   Liver lesion 09/06/2023   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   Pain due to onychomycosis of toenails of both feet 12/01/2020   Encephalopathy due to COVID-19 virus 06/04/2020   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/10/2019   UTI (urinary tract infection) 12/06/2018   Vertebral osteomyelitis (HCC) 12/05/2018   Spinal stenosis 12/05/2018   AKI (acute kidney injury) 12/05/2018   Acute metabolic encephalopathy 10/07/2018   Chronic diastolic (congestive) heart failure (HCC) 10/07/2018   GERD (gastroesophageal reflux disease) 10/07/2018   Abnormal glucose tolerance test 07/01/2018   Colon, diverticulosis 07/01/2018   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   Glaucoma 07/01/2018   Stage 3a chronic kidney disease (HCC) 07/01/2018   Neuropathy 07/01/2018   Pure hypercholesterolemia 07/01/2018   Restless leg 07/01/2018   Discitis of thoracolumbar region 07/01/2018   Acute bronchitis due to infection 05/04/2017   Cough 04/21/2016   Preoperative cardiovascular examination 04/18/2015   Parkinsonism (HCC) 05/15/2014  Chronic anticoagulation -warfarin therapy 02/03/2014   Atrial fibrillation with RVR (HCC) 01/31/2014   Gait instability 11/16/2013   Edema 03/03/2010   ATRIAL FIBRILLATION 04/23/2008   Hyperlipidemia 01/31/2008   Obstructive sleep apnea 01/31/2008   Unspecified glaucoma 01/30/2008   Seasonal and perennial allergic rhinitis 01/30/2008   Osteoarthritis 01/30/2008   PCP:  Yolande Toribio MATSU, MD Pharmacy:   [facility] [phone] - [address] [place] - [address] [place] [address] Phone: [phone] Fax: [phone]  [facility] -  [place], [place] - [address] [place] [address] Phone: [phone] Fax: [phone]     Social Drivers of Health (SDOH) Social History: SDOH Screenings   Food Insecurity: No Food Insecurity (02/27/2024)  Housing: Low Risk  (02/27/2024)  Transportation Needs: No Transportation Needs (02/27/2024)  Utilities: Not At Risk (02/27/2024)  Depression (PHQ2-9): Low Risk  (10/04/2023)  Social Connections: Moderately Isolated (02/27/2024)  Tobacco Use: Low Risk  (12/13/2023)   SDOH Interventions:     Readmission Risk Interventions     No data to display

## 2024-02-28 NOTE — Progress Notes (Signed)
 PROGRESS NOTE  [name]  DOB: Apr 05, 1941  PCP: Yolande Toribio MATSU, MD FMW:[id number]  DOA: 02/27/2024  LOS: 1 day  Hospital Day: 2  Brief narrative: [name] is a 83 y.o. female with PMH significant for OSA, HTN, HLD, paroxysmal A-fib, CHF, GERD, scoliosis, lumbar stenosis, h/o epidural abscess, peripheral neuropathy, ESBL UTI. Long-term resident at [facility] nursing home, bedbound status but able to transfer herself in and out of bed with upper body strength. 9/22, patient presented to the ED with complaint of severe abdominal pain, nausea, vomiting, diarrhea. 2 weeks ago, diagnosed with UTI, completed 1 week course of antibiotics.  On the day of admission the patient had sudden onset left lower quadrant pain, without any urinary symptoms.  In the ED afebrile, heart rate in 80s and 90s, blood pressure running high over 160, went as high as 220/87. Labs with WC count 9, hemoglobin 11.9, platelet 186, lactic acid 1.5, BUN/creatinine 27/1.58 Respiratory virus panel unremarkable Urinalysis showed hazy yellow urine with moderate leukocytes, few bacteria It seems urine culture was not sent. Started on IV Rocephin  CT abdomen pelvis in comparison with CT from April 2025 showed 1. 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 2. Nonobstructing intrarenal stones. 3. Bladder wall thickening with gas in the bladder likely indicating cystitis. Correlate with urinalysis.  Admitted to [facility]  Subjective: Patient was seen and examined this morning. Elderly Caucasian female.  Propped up in bed.  Not in distress.  Slow to respond.  Oriented to place.  Able to have a conversation. 2 daughters are at bedside. Labs this morning with CBC unremarkable, BMP with BUN/creatinine 21/1.4  Assessment and plan: Incompletely treated UTI Recently completed 1 week course of antibiotics for UTI Patient with left flank pain.  Urinalysis  still seems abnormal.  Send urine culture. 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 Per daughters, patient was seen by urologist in the office after last hospitalization in April.  No intervention was recommended.  She is due for 104-month follow-up in October. I do not see any need of inpatient urology evaluation at this time  Chronic diastolic CHF Uncontrolled hypertension Blood pressure was elevated over 200 in the ED yesterday. PTA meds- Toprol  12.5 mg daily, Lasix  20 mg daily Family is concerned about dehydration.  I would hold Lasix , try to avoid further IV hydration for now.  Paroxysmal A-fib Continue Flecainide  50 mg daily Continue chronic anticoagulation with Eliquis  2.5 mg twice daily  CKD 3A Creatinine at baseline less than 1.5. Recent Labs    07/13/23 0842 09/06/23 1220 09/06/23 2359 09/08/23 0738 09/09/23 0837 02/27/24 1522 02/27/24 2027 02/28/24 0307  BUN 19 19 16 13 16  27*  --  21  CREATININE 0.90 1.21* 1.25* 1.21* 1.37* 1.58* 1.50* 1.40*  CO2 24 23 22 23 25 24   --  21*   Parkinson's disease Continue Sinemet   Diffuse aortic atherosclerosis  HLD Continue Pletal , pravastatin   GERD Hiatal hernia PPI  OSA Nightly CPAP   Peripheral neuropathy Continue Neurontin  600 mg twice daily, scheduled Tylenol  and local analgesic cream  Generalized weakness  Lumbar stenosis Bedbound status and nursing home  Long-term resident at [facility] nursing home, bedbound status but able to transfer herself in and out of bed with upper body strength. PT eval ordered   Mobility:  PT Orders:   PT Follow up Rec:    Goals of care   Code Status: Limited: Do not attempt  resuscitation (DNR) -DNR-LIMITED -Do Not Intubate/DNI      DVT prophylaxis:  apixaban  (ELIQUIS ) tablet 2.5 mg Start: 02/28/24 1215 apixaban  (ELIQUIS ) tablet 2.5 mg   Antimicrobials: IV Rocephin  Fluid: None Consultants: None Family Communication: Daughters at bedside  Status: Inpatient Level of care:  Med-Surg .  Patient is from: Long-term nursing resident at [facility] Needs to continue in-hospital care: Needs IV Rocephin , pending urine culture report Anticipated d/c to: probably back to [facility] in 1 to 2 days      Diet:  Diet Order             Diet Heart Room service appropriate? Yes; Fluid consistency: Thin  Diet effective now                   Scheduled Meds:  acetaminophen   1,000 mg Oral BID   apixaban   2.5 mg Oral BID   Boost Original  8 oz Oral BID   capsicum   Topical BID   carbidopa -levodopa   2 tablet Oral 3 times per day   Cholecalciferol   4,000 Units Oral Daily   cilostazol   50 mg Oral BID   Culturelle Kids  1 tablet Oral Daily   flecainide   50 mg Oral Daily   fluticasone   2 spray Each Nare Daily   gabapentin   600 mg Oral BID   latanoprost   1 drop Both Eyes QHS   Lidocaine  HCl  1 Application Topical BID   metoprolol  succinate  12.5 mg Oral Daily   pantoprazole   40 mg Oral QHS   pravastatin   40 mg Oral QHS   Super B Complex Maxi  1 tablet Oral Daily    PRN meds: hydrALAZINE , morphine  injection, ondansetron  **OR** ondansetron  (ZOFRAN ) IV, oxyCODONE    Infusions:   cefTRIAXone  (ROCEPHIN )  IV      Antimicrobials: Anti-infectives (From admission, onward)    Start     Dose/Rate Route Frequency Ordered Stop   02/28/24 1800  cefTRIAXone  (ROCEPHIN ) 1 g in sodium chloride  0.9 % 100 mL IVPB  Status:  Discontinued        1 g 200 mL/hr over 30 Minutes Intravenous Every 24 hours 02/27/24 1923 02/27/24 2144   02/28/24 1800  cefTRIAXone  (ROCEPHIN ) 1 g in sodium chloride  0.9 % 100 mL IVPB        1 g 200 mL/hr over 30 Minutes Intravenous Every 24 hours  02/27/24 2147     02/27/24 2230  meropenem  (MERREM ) 500 mg in sodium chloride  0.9 % 100 mL IVPB  Status:  Discontinued        500 mg 200 mL/hr over 30 Minutes Intravenous Every 8 hours 02/27/24 2144 02/27/24 2147   02/27/24 1815  cefTRIAXone  (ROCEPHIN ) 2 g in sodium chloride  0.9 % 100 mL IVPB        2 g 200 mL/hr over 30 Minutes Intravenous  Once 02/27/24 1800 02/27/24 1836       Objective: Vitals:   02/28/24 0829 02/28/24 0910  BP: (!) 170/58 (!) 170/58  Pulse: 61 61  Resp: 17   Temp: 97.7 F (36.5 C)   SpO2: 100%    No intake or output data in the 24 hours ending 02/28/24 1124 There were  no vitals filed for this visit. Weight change:  There is no height or weight on file to calculate BMI.   Physical Exam: General exam: Pleasant, elderly Caucasian female.  Not in physical distress Skin: No rashes, lesions or ulcers. HEENT: Atraumatic, normocephalic, no obvious bleeding Lungs: Clear to auscultation bilaterally,  CVS: S1, S2, no murmur,   GI/Abd: Soft, nontender, nondistended, bowel sound present,   CNS: Alert, awake, only to place.  Slow to respond. Psychiatry: Mood appropriate Extremities: No pedal edema, no calf tenderness  Data Review: I have personally reviewed the laboratory data and studies available.  F/u labs ordered Unresulted Labs (From admission, onward)     Start     Ordered   02/29/24 0500  Basic metabolic panel with GFR  Tomorrow morning,   R        02/28/24 1124   02/28/24 0815  Urine Culture (for pregnant, neutropenic or urologic patients or patients with an indwelling urinary catheter)  (Urine Labs)  Add-on,   AD       Question:  Indication  Answer:  Flank Pain   02/28/24 0815            Signed, Chapman Rota, MD [facility] 02/28/2024

## 2024-02-28 NOTE — Plan of Care (Signed)

## 2024-02-28 NOTE — Plan of Care (Signed)

## 2024-02-29 ENCOUNTER — Encounter (HOSPITAL_COMMUNITY): Payer: Self-pay | Admitting: Internal Medicine

## 2024-02-29 DIAGNOSIS — N3080 Other cystitis without hematuria: Secondary | ICD-10-CM | POA: Diagnosis not present

## 2024-02-29 DIAGNOSIS — N3000 Acute cystitis without hematuria: Secondary | ICD-10-CM | POA: Diagnosis not present

## 2024-02-29 DIAGNOSIS — N132 Hydronephrosis with renal and ureteral calculous obstruction: Secondary | ICD-10-CM | POA: Diagnosis not present

## 2024-02-29 LAB — BASIC METABOLIC PANEL WITH GFR
Anion gap: 9 (ref 5–15)
BUN: 17 mg/dL (ref 8–23)
CO2: 25 mmol/L (ref 22–32)
Calcium: 9.2 mg/dL (ref 8.9–10.3)
Chloride: 106 mmol/L (ref 98–111)
Creatinine, Ser: 1.26 mg/dL — ABNORMAL HIGH (ref 0.44–1.00)
GFR, Estimated: 43 mL/min — ABNORMAL LOW (ref >60)
Glucose, Bld: 110 mg/dL — ABNORMAL HIGH (ref 70–99)
Potassium: 4.2 mmol/L (ref 3.5–5.1)
Sodium: 140 mmol/L (ref 135–145)

## 2024-02-29 MED ORDER — SODIUM CHLORIDE 0.9 % IV SOLN: 2.0000 g | Freq: Two times a day (BID) | INTRAVENOUS | Status: DC

## 2024-02-29 MED ORDER — SODIUM CHLORIDE 0.9 % IV SOLN
1.0000 g | Freq: Two times a day (BID) | INTRAVENOUS | Status: DC
  Administered 2024-02-29 – 2024-03-01 (×3): 1 g via INTRAVENOUS
  Filled 2024-02-29 (×5): qty 20

## 2024-02-29 MED ORDER — HYDRALAZINE HCL 10 MG PO TABS
10.0000 mg | ORAL_TABLET | Freq: Three times a day (TID) | ORAL | Status: DC
Start: 2024-02-29 — End: 2024-03-02
  Administered 2024-02-29 – 2024-03-01 (×5): 10 mg via ORAL
  Filled 2024-02-29 (×6): qty 1

## 2024-02-29 NOTE — NC FL2 (Signed)
 [place]  MEDICAID FL2 LEVEL OF CARE FORM     IDENTIFICATION  Patient Name: [name] Birthdate: 1941-06-07 Sex: female Admission Date (Current Location): 02/27/2024  [facility] and [place] Number:  Producer, television/film/video and Address:  [facility], [address] [place], [place] [address]      Provider Number: [id number]  Attending Physician Name and Address:  Trixie Nilda HERO, MD  Relative Name and Phone Number:  Suzen Daring (daughter) [phone]    Current Level of Care: Hospital Recommended Level of Care: Skilled Nursing Facility Prior Approval Number:    Date Approved/Denied:   PASRR Number: [id number]  Discharge Plan: SNF    Current Diagnoses: Patient Active Problem List   Diagnosis Date Noted   Essential hypertension 02/27/2024   Hydronephrosis of left kidney 09/07/2023   Hydronephrosis 09/06/2023   Dysphagia 09/06/2023   Liver lesion 09/06/2023   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   Pain due to onychomycosis of toenails of both feet 12/01/2020   Encephalopathy due to COVID-19 virus 06/04/2020   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/10/2019   UTI (urinary tract infection) 12/06/2018   Vertebral osteomyelitis (HCC) 12/05/2018   Spinal stenosis 12/05/2018   AKI (acute kidney injury) 12/05/2018   Acute metabolic encephalopathy 10/07/2018   Chronic diastolic (congestive) heart failure (HCC) 10/07/2018   GERD (gastroesophageal reflux disease) 10/07/2018   Abnormal glucose tolerance test 07/01/2018   Colon, diverticulosis 07/01/2018   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   Glaucoma 07/01/2018   Stage 3a chronic kidney disease (HCC) 07/01/2018   Neuropathy 07/01/2018   Pure hypercholesterolemia 07/01/2018   Restless leg 07/01/2018   Discitis of thoracolumbar region 07/01/2018   Acute bronchitis due to infection 05/04/2017   Cough 04/21/2016   Preoperative cardiovascular examination 04/18/2015   Parkinsonism (HCC) 05/15/2014   Chronic  anticoagulation -warfarin therapy 02/03/2014   Atrial fibrillation with RVR (HCC) 01/31/2014   Gait instability 11/16/2013   Edema 03/03/2010   ATRIAL FIBRILLATION 04/23/2008   Hyperlipidemia 01/31/2008   Obstructive sleep apnea 01/31/2008   Unspecified glaucoma 01/30/2008   Seasonal and perennial allergic rhinitis 01/30/2008   Osteoarthritis 01/30/2008    Orientation RESPIRATION BLADDER Height & Weight     Self, Place, Situation  Normal Incontinent Weight: 196 lb 3.4 oz (89 kg) Height:     BEHAVIORAL SYMPTOMS/MOOD NEUROLOGICAL BOWEL NUTRITION STATUS      Continent Diet (Please see discharge summary)  AMBULATORY STATUS COMMUNICATION OF NEEDS Skin   Extensive Assist Verbally Other (Comment) (Ecchymosis,arm,Leg,Bil.,Erythema,face,upper,Wound/Incision LDAs)                       Personal Care Assistance Level of Assistance  Dressing, Feeding, Bathing Bathing Assistance: Maximum assistance   Dressing Assistance: Maximum assistance     Functional Limitations Info  Sight, Hearing, Speech Sight Info:  Financial trader) Hearing Info: Adequate Speech Info: Adequate    SPECIAL CARE FACTORS FREQUENCY  PT (By licensed PT), OT (By licensed OT)     PT Frequency: 5x min weekly OT Frequency: 5x min weekly            Contractures Contractures Info: Not present    Additional Factors Info  Code Status, Allergies, Psychotropic Code Status Info: DNR Allergies Info: Lyrica  (pregabalin ),Zocor  (simvastatin ),Relafen (nabumetone) Psychotropic Info: gabapentin  (NEURONTIN ) capsule 600 mg 2 times daily         Current Medications (02/29/2024):  This is the current hospital active medication list Current Facility-Administered Medications  Medication Dose Route Frequency Provider Last Rate  Last Admin   acetaminophen  (TYLENOL ) tablet 1,000 mg  1,000 mg Oral BID Dahal, Binaya, MD   1,000 mg at 02/29/24 9170   apixaban  (ELIQUIS ) tablet 2.5 mg  2.5 mg Oral BID Dahal, Binaya, MD   2.5 mg at  02/29/24 0829   capsicum (ZOSTRIX) 0.075 % cream   Topical BID Arlice Reichert, MD   Given at 02/29/24 9167   carbidopa -levodopa  (SINEMET  IR) 25-100 MG per tablet immediate release 2 tablet  2 tablet Oral 3 times per day Sim Emery CROME, MD   2 tablet at 02/29/24 1213   cholecalciferol  (VITAMIN D3) 25 MCG (1000 UNIT) tablet 4,000 Units  4,000 Units Oral Daily Dahal, Reichert, MD   4,000 Units at 02/29/24 9170   cilostazol  (PLETAL ) tablet 50 mg  50 mg Oral BID Dahal, Binaya, MD   50 mg at 02/29/24 9170   feeding supplement (BOOST / RESOURCE BREEZE) liquid 1 Container  237 mL Oral BID Dahal, Reichert, MD   237 mL at 02/28/24 1300   flecainide  (TAMBOCOR ) tablet 50 mg  50 mg Oral Daily Dahal, Binaya, MD   50 mg at 02/29/24 9170   fluticasone  (FLONASE ) 50 MCG/ACT nasal spray 2 spray  2 spray Each Nare Daily Dahal, Reichert, MD   2 spray at 02/29/24 9167   gabapentin  (NEURONTIN ) capsule 600 mg  600 mg Oral BID Sim Emery L, MD   600 mg at 02/29/24 9171   hydrALAZINE  (APRESOLINE ) injection 10 mg  10 mg Intravenous Q4H PRN Sim Emery CROME, MD       hydrALAZINE  (APRESOLINE ) tablet 10 mg  10 mg Oral Q8H Gherghe, Costin M, MD   10 mg at 02/29/24 1331   latanoprost  (XALATAN ) 0.005 % ophthalmic solution 1 drop  1 drop Both Eyes QHS Dahal, Binaya, MD       meropenem  (MERREM ) 1 g in sodium chloride  0.9 % 100 mL IVPB  1 g Intravenous Q12H Reome, Earle J, RPH 200 mL/hr at 02/29/24 1224 1 g at 02/29/24 1224   metoprolol  succinate (TOPROL -XL) 24 hr tablet 12.5 mg  12.5 mg Oral Daily Garba, Mohammad L, MD   12.5 mg at 02/29/24 9170   morphine  (PF) 2 MG/ML injection 2 mg  2 mg Intravenous Q2H PRN Sim Emery CROME, MD   2 mg at 02/27/24 2004   Muscle Rub CREA 1 Application  1 Application Topical BID Dahal, Binaya, MD   1 Application at 02/29/24 9167   ondansetron  (ZOFRAN ) tablet 4 mg  4 mg Oral Q6H PRN Sim Emery CROME, MD       Or   ondansetron  (ZOFRAN ) injection 4 mg  4 mg Intravenous Q6H PRN Sim Emery CROME, MD    4 mg at 02/27/24 2004   oxyCODONE  (Oxy IR/ROXICODONE ) immediate release tablet 5 mg  5 mg Oral Q6H PRN Dahal, Reichert, MD       pantoprazole  (PROTONIX ) EC tablet 40 mg  40 mg Oral QHS Dahal, Reichert, MD   40 mg at 02/28/24 2211   pravastatin  (PRAVACHOL ) tablet 40 mg  40 mg Oral QHS Garba, Mohammad L, MD   40 mg at 02/28/24 2210     Discharge Medications: Please see discharge summary for a list of discharge medications.  Relevant Imaging Results:  Relevant Lab Results:   Additional Information SSN-[id number]  Isaiah Public, LCSWA

## 2024-02-29 NOTE — TOC Progression Note (Addendum)
 Transition of Care [facility]) - Progression Note    Patient Details  Name: [name] MRN: [id number] Date of Birth: [birth year]  [facility]) CM/SW Contact  Isaiah Public, LCSWA Phone Number: 02/29/2024, 2:11 PM  Clinical Narrative:      CSW spoke with patients daughter Suzen regarding PT recommendation of patient receiving some short term rehab when she returns to [facility] at time of discharge.  Patients daughter expressed understanding of PT recommendation and is agreeable for patient to receive short term rehab when she returns to facility.CSW discussed insurance authorization process.  CSW informed Randine with [facility].No further questions reported at this time.CSW spoke with Ellouise with [facility] and requested to start insurance authorization for SNF and PTAR for patient. CSW to continue to follow and assist with discharge planning needs.   Expected Discharge Plan: Skilled Nursing Facility Barriers to Discharge: Continued Medical Work up               Expected Discharge Plan and Services In-house Referral: Clinical Social Work     Living arrangements for the past 2 months: Skilled Nursing Facility                                       Social Drivers of Health (SDOH) Interventions SDOH Screenings   Food Insecurity: No Food Insecurity (02/27/2024)  Housing: Low Risk  (02/27/2024)  Transportation Needs: No Transportation Needs (02/27/2024)  Utilities: Not At Risk (02/27/2024)  Depression (PHQ2-9): Low Risk  (10/04/2023)  Social Connections: Moderately Isolated (02/27/2024)  Tobacco Use: Low Risk  (12/13/2023)    Readmission Risk Interventions     No data to display

## 2024-02-29 NOTE — Progress Notes (Signed)
 Mobility Specialist Progress Note:    02/29/24 1552  Mobility  Activity Pivoted/transferred from chair to bed  Level of Assistance Moderate assist, patient does 50-74%  Assistive Device Stedy  Activity Response Tolerated well  Mobility Referral Yes  Mobility visit 1 Mobility  Mobility Specialist Start Time (ACUTE ONLY) 1521  Mobility Specialist Stop Time (ACUTE ONLY) 1544  Mobility Specialist Time Calculation (min) (ACUTE ONLY) 23 min   Received pt in chair requesting to get to bed. Pt required ModA +2 w/ the Stedy. Pt had no c/o. Returned pt to bed without fault. NT and RN present. All needs met.  Lavanda Pollack Mobility Specialist  Please contact via [employer] or  Rehab Office [phone]

## 2024-02-29 NOTE — Progress Notes (Signed)
 Pharmacy Antibiotic Note  [name] is a 83 y.o. female admitted on 02/27/2024 with ESBL .  Pharmacy has been consulted for merrem  dosing.  Plan: Merrem  1 g iv q12h     Temp (24hrs), Avg:98.2 F (36.8 C), Min:97.7 F (36.5 C), Max:98.9 F (37.2 C)  Recent Labs  Lab 02/27/24 1522 02/27/24 1537 02/27/24 2027 02/28/24 0307 02/29/24 0317  WBC 9.0  --  7.6 6.6  --   CREATININE 1.58*  --  1.50* 1.40* 1.26*  LATICACIDVEN  --  1.5  --   --   --     CrCl cannot be calculated (Unknown ideal weight.).    Allergies  Allergen Reactions   Lyrica  [Pregabalin ] Other (See Comments)    Felt loopy   Zocor  [Simvastatin ] Other (See Comments)    Myalgias   Relafen [Nabumetone] Rash     Thank you for allowing pharmacy to be a part of this patient's care.  Benedetta Heath BS, PharmD, BCPS Clinical Pharmacist 02/29/2024 8:10 AM  Contact: [phone] after 3 PM

## 2024-02-29 NOTE — Progress Notes (Signed)
 PROGRESS NOTE  [name] FMW:[id number] DOB: May 03, 1941 DOA: 02/27/2024 PCP: Yolande Toribio MATSU, MD   LOS: 2 days   Brief Narrative / Interim history: [name] is a 83 y.o. female with PMH significant for OSA, HTN, HLD, paroxysmal A-fib, CHF, GERD, scoliosis, lumbar stenosis, h/o epidural abscess, peripheral neuropathy, ESBL UTI. Long-term resident at [facility] nursing home, bedbound status but able to transfer herself in and out of bed with upper body strength. 9/22, patient presented to the ED with complaint of severe abdominal pain, nausea, vomiting, diarrhea. 2 weeks ago, diagnosed with UTI in her nursing home, per daughter she grew ESBL bacteria, completed 1 week course of antibiotics but unclear to me what antibiotics she was on.  On the day of admission the patient had sudden onset left lower quadrant pain, without any urinary symptoms. 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 It also showed, bladder wall thickening with gas in the bladder likely indicating cystitis.  Subjective / 24h Interval events: Feeling tired and weak this morning. Denies shortness of breath, no chest pain. Does complain of lower abdominal discomfort.  Assesement and Plan: Principal problem UTI - Recently completed 1 week course of antibiotics for UTI at the nursing home. Called and discussed with [facility], culture data from 8/22 showed ESBL Klebsiella, sensitive to Cipro and levo - floxacin, meropenem .  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 Per daughters,  patient was seen by urologist in the office after last hospitalization in April.  No intervention was recommended.  She is due for 67-month follow-up in October. -Will touch base with urology over the phone, awaiting callback   Chronic diastolic CHF, Uncontrolled hypertension -Blood pressure was elevated over 200 in the ED on arrival, continue metoprolol .  Blood pressure still elevated, add hydralazine   Paroxysmal A-fib - Continue Flecainide  50 mg daily. Continue chronic anticoagulation with Eliquis  2.5 mg twice daily   CKD 3A - Creatinine at baseline less than 1.5.  Parkinson's disease - Continue Sinemet    HLD - Continue Pletal , pravastatin    OSA - Nightly CPAP   Peripheral neuropathy - Continue Neurontin  600 mg twice daily, scheduled Tylenol  and local analgesic cream   Generalized weakness  Lumbar stenosis Bedbound status and nursing home  - Long-term resident at [facility] nursing home, bedbound status but able to transfer herself in and out of bed with upper body strength. PT eval ordered  Scheduled Meds:  acetaminophen   1,000 mg Oral BID   apixaban   2.5 mg Oral BID   capsicum   Topical BID   carbidopa -levodopa   2 tablet Oral 3 times per day   cholecalciferol   4,000 Units Oral Daily   cilostazol   50 mg Oral BID   feeding supplement  237 mL Oral BID   flecainide   50 mg Oral Daily   fluticasone   2 spray Each Nare Daily   gabapentin   600 mg Oral BID   latanoprost   1 drop Both Eyes QHS   metoprolol  succinate  12.5 mg Oral Daily   Muscle Rub  1 Application Topical BID   pantoprazole   40 mg Oral QHS   pravastatin   40 mg  Oral QHS   Continuous Infusions:  meropenem  (MERREM ) IV     PRN Meds:.hydrALAZINE , morphine  injection, ondansetron  **OR** ondansetron  (ZOFRAN ) IV, oxyCODONE   Current Outpatient Medications  Medication Instructions   acetaminophen  (TYLENOL ) 1,000 mg, 2 times daily   B Complex-Folic Acid  (SUPER B COMPLEX MAXI) TABS 1 tablet, Daily   capsicum (ZOSTRIX) 0.075 %  topical cream Topical, 2 times daily   carbidopa -levodopa  (SINEMET  IR) 25-100 MG tablet 3 at 8am/2 at noon/2 at 4pm x 2 weeks and then take 2 tablets at 8am/noon/4pm   Carboxymethylcellulose Sodium (ARTIFICIAL TEARS OP) 1 drop, Both Eyes, 2 times daily   cetirizine (ZYRTEC) 10 mg, Daily PRN   Cholecalciferol  4,000 Units, Daily   cilostazol  (PLETAL ) 50 mg, 2 times daily   Eliquis  2.5 mg, 2 times daily   flecainide  (TAMBOCOR ) 50 mg, Daily   fluticasone  (FLONASE ) 50 MCG/ACT nasal spray 2 sprays, Daily   furosemide  (LASIX ) 20 MG tablet TAKE 1 TABLET(20 MG) BY MOUTH DAILY   gabapentin  (NEURONTIN ) 600 mg, Oral, 2 times daily   Lactobacillus Rhamnosus, GG, (CULTURELLE KIDS) PACK 1 tablet, Daily   Lidocaine  HCl (ASPERCREME LIDOCAINE ) 4 % CREA 1 Application, 2 times daily   metoprolol  succinate (TOPROL -XL) 12.5 mg, Every evening   Nutritional Supplements (BOOST ORIGINAL) LIQD 8 oz, 2 times daily   pantoprazole  (PROTONIX ) 40 mg, Oral, Daily at bedtime   Pollen Extracts (PROSTAT PO) 30 mLs, 2 times daily   potassium chloride  SA (K-DUR) 20 MEQ tablet 20 mEq, Oral, Daily   pravastatin  (PRAVACHOL ) 40 mg, Daily at bedtime   TRAVATAN Z 0.004 % SOLN ophthalmic solution 1 drop, Daily at bedtime   trolamine salicylate (ASPERCREME) 10 % cream 1 Application, Topical, 2 times daily    Diet Orders (From admission, onward)     Start     Ordered   02/27/24 1923  Diet Heart Room service appropriate? Yes; Fluid consistency: Thin  Diet effective now       Question Answer Comment  Room service appropriate? Yes   Fluid consistency: Thin      02/27/24 1923            DVT prophylaxis: apixaban  (ELIQUIS ) tablet 2.5 mg Start: 02/28/24 1215 apixaban  (ELIQUIS ) tablet 2.5 mg   Lab Results  Component Value Date   PLT 155 02/28/2024      Code Status: Limited: Do not attempt resuscitation (DNR) -DNR-LIMITED -Do Not Intubate/DNI   Family Communication: Daughter present at bedside  Status is:  Inpatient Remains inpatient appropriate because: IV antibiotics   Level of care: Med-Surg  Consultants:  None  Objective: Vitals:   02/28/24 1821 02/28/24 2054 02/29/24 0640 02/29/24 0829  BP: (!) 161/68 (!) 143/59 (!) 162/88 (!) 184/61  Pulse: 80 79 90 89  Resp: 15 16 17    Temp: 98.2 F (36.8 C) 98.2 F (36.8 C) 98 F (36.7 C)   TempSrc: Oral Oral Oral   SpO2: 100% 99% 96%     Intake/Output Summary (Last 24 hours) at 02/29/2024 0854 Last data filed at 02/29/2024 0518 Gross per 24 hour  Intake 622.29 ml  Output --  Net 622.29 ml   Wt Readings from Last 3 Encounters:  09/06/23 88 kg  07/26/23 86.6 kg  11/22/22 95.3 kg    Examination:  Constitutional: NAD Eyes: no scleral icterus ENMT: Mucous membranes are moist.  Neck: normal, supple Respiratory: clear to auscultation bilaterally, no wheezing, no crackles.  Cardiovascular: Regular rate and rhythm, no murmurs / rubs / gallops. No LE edema.  Abdomen: non distended, no tenderness. Bowel sounds positive.  Musculoskeletal: no clubbing / cyanosis.   Data Reviewed: I have independently reviewed following labs and imaging studies   CBC Recent Labs  Lab 02/27/24 1522 02/27/24 2027 02/28/24 0307  WBC 9.0 7.6 6.6  HGB 11.9* 12.1 12.2  HCT 39.0 38.4 39.2  PLT 186 172 155  MCV 87.6 85.0 87.1  MCH 26.7 26.8 27.1  MCHC 30.5 31.5 31.1  RDW 15.5 15.8* 15.8*  LYMPHSABS 1.1  --   --   MONOABS 0.6  --   --   EOSABS 0.1  --   --   BASOSABS 0.0  --   --     Recent Labs  Lab 02/27/24 1522 02/27/24 1537 02/27/24 2027 02/28/24 0307 02/29/24 0317  NA 139  --   --  142 140  K 4.4  --   --  4.2 4.2  CL 104  --   --  108 106  CO2 24  --   --  21* 25  GLUCOSE 121*  --   --  86 110*  BUN 27*  --   --  21 17  CREATININE 1.58*  --  1.50* 1.40* 1.26*  CALCIUM  9.1  --   --  9.1 9.2  AST 23  --   --  16  --   ALT 13  --   --  17  --   ALKPHOS 104  --   --  93  --   BILITOT 0.5  --   --  0.5  --   ALBUMIN 3.8  --   --   3.6  --   MG 2.1  --   --   --   --   LATICACIDVEN  --  1.5  --   --   --     ------------------------------------------------------------------------------------------------------------------ No results for input(s): CHOL, HDL, LDLCALC, TRIG, CHOLHDL, LDLDIRECT in the last 72 hours.  Lab Results  Component Value Date   HGBA1C 6.0 (H) 02/27/2013   ------------------------------------------------------------------------------------------------------------------ No results for input(s): TSH, T4TOTAL, T3FREE, THYROIDAB in the last 72 hours.  Invalid input(s): FREET3  Cardiac Enzymes No results for input(s): CKMB, TROPONINI, MYOGLOBIN in the last 168 hours.  Invalid input(s): CK ------------------------------------------------------------------------------------------------------------------    Component Value Date/Time   BNP 43.7 12/25/2019 2205    CBG: No results for input(s): GLUCAP in the last 168 hours.  Recent Results (from the past 240 hours)  Resp panel by RT-PCR (RSV, Flu A&B, Covid) Anterior Nasal Swab     Status: None   Collection Time: 02/27/24  1:33 PM   Specimen: Anterior Nasal Swab  Result Value Ref Range Status   SARS Coronavirus 2 by RT PCR NEGATIVE NEGATIVE Final   Influenza A by PCR NEGATIVE NEGATIVE Final   Influenza B by PCR NEGATIVE NEGATIVE Final    Comment: (NOTE) The Xpert Xpress SARS-CoV-2/FLU/RSV plus assay is intended as an aid in the diagnosis of influenza from Nasopharyngeal swab specimens and should not be used as a sole basis for treatment. Nasal washings and aspirates are unacceptable for Xpert Xpress SARS-CoV-2/FLU/RSV testing.  Fact Sheet for Patients: BloggerCourse.com  Fact Sheet for Healthcare Providers: SeriousBroker.it  This test is not yet approved or cleared by the [place]  FDA and has been authorized for detection and/or diagnosis of  SARS-CoV-2 by FDA under an Emergency Use Authorization (EUA). This EUA will remain in effect (meaning this test can be used) for the duration of the COVID-19 declaration  under Section 564(b)(1) of the Act, 21 U.S.C. section 360bbb-3(b)(1), unless the authorization is terminated or revoked.     Resp Syncytial Virus by PCR NEGATIVE NEGATIVE Final    Comment: (NOTE) Fact Sheet for Patients: BloggerCourse.com  Fact Sheet for Healthcare Providers: SeriousBroker.it  This test is not yet approved or cleared by the [place]  FDA and has been authorized for detection and/or diagnosis of SARS-CoV-2 by FDA under an Emergency Use Authorization (EUA). This EUA will remain in effect (meaning this test can be used) for the duration of the COVID-19 declaration under Section 564(b)(1) of the Act, 21 U.S.C. section 360bbb-3(b)(1), unless the authorization is terminated or revoked.  Performed at [facility], [address], [place], [place] [address]      Radiology Studies: No results found.   Nilda Fendt, MD, PhD [facility]  Between 7 am - 7 pm I am available, please contact me via Amion (for emergencies) or Securechat (non urgent messages)  Between 7 pm - 7 am I am not available, please contact night coverage MD/APP via Amion

## 2024-02-29 NOTE — Plan of Care (Signed)

## 2024-02-29 NOTE — Consult Note (Signed)
 [facility]    Date of Admission:  02/27/2024     Total days of antibiotics 3               Reason for Consult: UTI    Referring Provider: Dr. Trixie  Primary Care Provider: Yolande Toribio MATSU, MD   ASSESSMENT:  [name] is an 83 year old Caucasian female with history of recent ESBL E. coli UTI sensitive to ciprofloxacin status post treatment completing treatment about 1 week ago presenting with abdominal pain, nausea, vomiting, and diarrhea.  CT imaging with hydronephrosis on the left and ureteropelvic junction obstruction.  [name] antibiotics have been changed to meropenem  in the setting of previous ESBL Klebsiella with current urinary culture from 02/28/2024 growing E. coli with sensitivities pending.  Discussed plan of care and need for continued antibiotics.  Urology consulted.  Continue to monitor urine culture for organism and adjust antibiotics as appropriate.  Continue standard/universal precautions.  Remaining medical and supportive care per internal medicine.  PLAN:  Continue current dose of meropenem . Urology consult pending. Monitor cultures for E. coli sensitivities and adjust antibiotics accordingly. Standard/universal precautions. Remaining medical and supportive care per internal medicine.   Principal Problem:   UTI (urinary tract infection) Active Problems:   Hyperlipidemia   ATRIAL FIBRILLATION   Obstructive sleep apnea   Parkinsonism (HCC)   Stage 3a chronic kidney disease (HCC)   GERD (gastroesophageal reflux disease)   Essential hypertension    acetaminophen   1,000 mg Oral BID   apixaban   2.5 mg Oral BID   capsicum   Topical BID   carbidopa -levodopa   2 tablet Oral 3 times per day   cholecalciferol   4,000 Units Oral Daily   cilostazol   50 mg Oral BID   feeding supplement  237 mL Oral BID   flecainide   50 mg Oral Daily   fluticasone   2 spray Each Nare Daily   gabapentin   600 mg Oral BID   hydrALAZINE    10 mg Oral Q8H   latanoprost   1 drop Both Eyes QHS   metoprolol  succinate  12.5 mg Oral Daily   Muscle Rub  1 Application Topical BID   pantoprazole   40 mg Oral QHS   pravastatin   40 mg Oral QHS     HPI: [name] is a 83 y.o. female with previous medical history of congestive heart failure, hyperlipidemia, hypertension, obstructive sleep apnea, and paroxysmal atrial fibrillation presenting to the hospital from collapse nursing facility with abdominal pain, nausea, vomiting, and diarrhea.  [name] was previously treated for urinary tract infection at [facility] skilled [facility] with cultures positive for ESBL Klebsiella pneumoniae and was treated with ciprofloxacin.  Has not been on antibiotics since treatment approximately 1 to 2 weeks ago.  Now presenting with lower abdominal pain, nausea, vomiting, and diarrhea.  Has not had any additional bowel movements since being in the hospital.  Initially afebrile with no leukocytosis and white blood cell count 6600.  Started on ceftriaxone .  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  Urine culture from 02/28/2024 with greater than 100,000 colonies of E. coli and susceptibilities following.  Antibiotics have been changed to meropenem .  [name] is on day 3 of antimicrobial therapy with no urinary symptoms and continued lower abdominal pain.  Family at bedside and contributed to the history as well as chart review.  Review of Systems: Review of Systems  Constitutional:  Negative for chills, fever and weight loss.  Respiratory:  Negative for cough, shortness of breath and wheezing.   Cardiovascular:  Negative for chest pain and leg swelling.  Gastrointestinal:  Negative for abdominal pain, constipation, diarrhea, nausea and vomiting.  Skin:  Negative for rash.     Past  Medical History:  Diagnosis Date   Allergic rhinitis    Arthritis    all over   CHF (congestive heart failure) (HCC)    Chronic back pain    DJD (degenerative joint disease)    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   GERD (gastroesophageal reflux disease)    Hyperlipidemia    Hypertension    Lumbar stenosis    OSA (obstructive sleep apnea)    couldn't handle CPAP; use mouth guard some; not all the time (01/06/2014)   PAF (paroxysmal atrial fibrillation) (HCC)    Scoliosis    with radiculopathy L2-S1 with prior surgery   Small bowel obstruction (HCC)    versus ileus after last bck surgery   Spondylosis     Social History   Tobacco Use   Smoking status: Never   Smokeless tobacco: Never  Vaping Use   Vaping status: Never Used  Substance Use Topics   Alcohol  use: No    Alcohol /week: 0.0 standard drinks of alcohol    Drug use: No    Family History  Problem Relation Age of Onset   Arthritis Mother    Heart attack Father    Hypertension Sister    Lung cancer Sister    Asthma Sister    Allergies Sister     Allergies  Allergen Reactions   Lyrica  [Pregabalin ] Other (See Comments)    Felt loopy   Zocor  [Simvastatin ] Other (See Comments)    Myalgias   Relafen [Nabumetone] Rash    OBJECTIVE: Blood pressure (!) 118/56, pulse 90, temperature 98.8 F (37.1 C), temperature source Oral, resp. rate 17, weight 89 kg, SpO2 99%.  Physical Exam Constitutional:      General: She is not in acute distress.    Appearance: She is well-developed.  Cardiovascular:     Rate and Rhythm: Normal rate and regular rhythm.     Heart sounds: Normal heart sounds.  Pulmonary:     Effort: Pulmonary effort is normal.     Breath sounds: Normal breath sounds.  Skin:    General: Skin is warm and dry.  Neurological:     Mental Status: She is alert.     Lab Results Lab Results  Component Value Date   WBC 6.6 02/28/2024   HGB 12.2 02/28/2024   HCT 39.2 02/28/2024   MCV 87.1 02/28/2024   PLT  155 02/28/2024    Lab Results  Component Value Date   CREATININE 1.26 (H) 02/29/2024   BUN 17 02/29/2024   NA 140 02/29/2024   K 4.2 02/29/2024   CL 106 02/29/2024   CO2 25 02/29/2024    Lab Results  Component Value Date   ALT 17 02/28/2024   AST 16 02/28/2024   ALKPHOS 93 02/28/2024   BILITOT 0.5 02/28/2024     Microbiology: Recent Results (from the past 240 hours)  Resp panel by RT-PCR (RSV, Flu A&B, Covid) Anterior Nasal Swab     Status: None   Collection Time: 02/27/24  1:33 PM   Specimen: Anterior Nasal Swab  Result Value Ref Range Status   SARS Coronavirus 2 by RT PCR NEGATIVE NEGATIVE Final   Influenza  A by PCR NEGATIVE NEGATIVE Final   Influenza B by PCR NEGATIVE NEGATIVE Final    Comment: (NOTE) The Xpert Xpress SARS-CoV-2/FLU/RSV plus assay is intended as an aid in the diagnosis of influenza from Nasopharyngeal swab specimens and should not be used as a sole basis for treatment. Nasal washings and aspirates are unacceptable for Xpert Xpress SARS-CoV-2/FLU/RSV testing.  Fact Sheet for Patients: BloggerCourse.com  Fact Sheet for Healthcare Providers: SeriousBroker.it  This test is not yet approved or cleared by the [place]  FDA and has been authorized for detection and/or diagnosis of SARS-CoV-2 by FDA under an Emergency Use Authorization (EUA). This EUA will remain in effect (meaning this test can be used) for the duration of the COVID-19 declaration under Section 564(b)(1) of the Act, 21 U.S.C. section 360bbb-3(b)(1), unless the authorization is terminated or revoked.     Resp Syncytial Virus by PCR NEGATIVE NEGATIVE Final    Comment: (NOTE) Fact Sheet for Patients: BloggerCourse.com  Fact Sheet for Healthcare Providers: SeriousBroker.it  This test is not yet approved or cleared by the [place]  FDA and has been authorized for detection and/or  diagnosis of SARS-CoV-2 by FDA under an Emergency Use Authorization (EUA). This EUA will remain in effect (meaning this test can be used) for the duration of the COVID-19 declaration under Section 564(b)(1) of the Act, 21 U.S.C. section 360bbb-3(b)(1), unless the authorization is terminated or revoked.  Performed at [facility], [address], [place], [place] [address]   Urine Culture (for pregnant, neutropenic or urologic patients or patients with an indwelling urinary catheter)     Status: Abnormal (Preliminary result)   Collection Time: 02/28/24  8:15 AM   Specimen: Urine, Clean Catch  Result Value Ref Range Status   Specimen Description URINE, CLEAN CATCH  Final   Special Requests NONE  Final   Culture (A)  Final    >=100,000 COLONIES/mL ESCHERICHIA COLI SUSCEPTIBILITIES TO FOLLOW Performed at [facility], [address] [place], [place] [address]    Report Status PENDING  Incomplete     Cathlyn July, NP [facility]  02/29/2024  1:41 PM

## 2024-02-29 NOTE — Consult Note (Addendum)
 Urology Consult Note   Requesting Attending Physician:  Trixie Nilda HERO, MD Service Providing Consult: Urology  Consulting Attending: Dr. Renda   Reason for Consult:  chronic hydronephrosis and UTI  HPI: [name] is seen in consultation for reasons noted above at the request of Trixie Nilda HERO, MD. Patient is a 83 y.o. female presenting to [facility] emergency department on 02/27/2024 for abdominal pain, N/V, and diarrhea and left flank pain.  CT workup in the ED revealed prominent left side hydronephrosis with questionable UPJ obstruction vs left parapelvic renal cysts-chronic finding, intrarenal stones, bladder wall thickening, and gas in the bladder.  Patient is known to [facility] and has most recently been seen by Dr. Elisabeth for this question of UPJ obstruction.   On my arrival patient was alert but nonresponsive.  I reviewed the case and plan with her daughter/caretaker.  Per her workup in our office this spring there was a question as to whether this truly represented UPJ obstruction versus renal cyst.  She is scheduled to follow-up with Dr. Elisabeth later in October. ------------------  Assessment:   83 y.o. 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  This is a chronic finding and considering her baseline decompensation, surgical intervention would not be recommended at this time.  There is very little parenchyma left and it is unlikely that this kidney is functioning in a meaningful way.  Should she become acutely ill or decompensate rapidly, would consider ureteral stent versus PCNT to facilitate drainage of infected urine.  Otherwise patient will plan to follow-up with Dr. Elisabeth as scheduled.  Urology will not need to follow.  Please feel free to call with questions, concerns, or acute urologic changes  Case and plan discussed with Dr.  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   GERD (gastroesophageal reflux disease)    Hyperlipidemia    Hypertension    Lumbar stenosis    OSA (obstructive sleep apnea)    couldn't handle CPAP; use mouth guard some; not all the time (01/06/2014)   PAF (paroxysmal atrial fibrillation) (HCC)    Scoliosis    with radiculopathy L2-S1 with prior surgery   Small bowel obstruction (HCC)    versus ileus after last bck surgery   Spondylosis     Past Surgical History:  Past Surgical History:  Procedure Laterality Date   BACK SURGERY     CATARACT EXTRACTION W/ INTRAOCULAR LENS  IMPLANT, BILATERAL  2011   CHOLECYSTECTOMY N/A 07/07/2018   Procedure: LAPAROSCOPIC CHOLECYSTECTOMY;  Surgeon: Vanderbilt Ned, MD;  Location: [facility] OR;  Service: General;  Laterality: N/A;   COLONOSCOPY  12/09/2004   LIPOMA EXCISION  1980's   fatty tumors   LUMBAR DISC SURGERY  02/2009   ruptured disc   NASAL SEPTUM SURGERY  80's   POSTERIOR LUMBAR FUSION  06/2010; 10/2011   placed screws, rods, spacers both times   POSTERIOR LUMBAR FUSION 4 LEVEL N/A 12/13/2018   Procedure: THORACIC NINE -LUMBAR THREE POSTERIOR INSTRUMENTATION FUSION;  Surgeon: Cheryle Ned LABOR, MD;  Location: [facility] OR;  Service: Neurosurgery;  Laterality: N/A;   REPAIR DURAL / CSF LEAK  02/2009   THORACIC LAMINECTOMY FOR EPIDURAL ABSCESS N/A 09/28/2018   Procedure: THORACIC NINE-THORACIC TEN, THORACIC TEN-THORACIC ELEVEN, THORACIC ELEVEN-THORACIC TWELVE LAMINECTOMIES FOR EPIDURAL ABSCESS;  Surgeon: Cheryle Debby LABOR, MD;  Location: [facility] OR;  Service: Neurosurgery;  Laterality: N/A;   TOTAL ABDOMINAL HYSTERECTOMY  02/1993    Medication: Current Facility-Administered Medications  Medication Dose Route Frequency Provider Last Rate Last Admin    acetaminophen  (TYLENOL ) tablet 1,000 mg  1,000 mg Oral BID Dahal, Binaya, MD   1,000 mg at 02/29/24 9170   apixaban  (ELIQUIS ) tablet 2.5 mg  2.5 mg Oral BID Dahal, Binaya, MD   2.5 mg at 02/29/24 0829   capsicum (ZOSTRIX) 0.075 % cream   Topical BID Arlice Reichert, MD   Given at 02/29/24 9167   carbidopa -levodopa  (SINEMET  IR) 25-100 MG per tablet immediate release 2 tablet  2 tablet Oral 3 times per day Sim Emery CROME, MD   2 tablet at 02/29/24 1213   cholecalciferol  (VITAMIN D3) 25 MCG (1000 UNIT) tablet 4,000 Units  4,000 Units Oral Daily Dahal, Reichert, MD   4,000 Units at 02/29/24 0829   cilostazol  (PLETAL ) tablet 50 mg  50 mg Oral BID Dahal, Binaya, MD   50 mg at 02/29/24 0829   feeding supplement (BOOST / RESOURCE BREEZE) liquid 1 Container  237 mL Oral BID Dahal, Reichert, MD   237 mL at 02/28/24 1300   flecainide  (TAMBOCOR ) tablet 50 mg  50 mg Oral Daily Dahal, Binaya, MD   50 mg at 02/29/24 9170   fluticasone  (FLONASE ) 50 MCG/ACT nasal spray 2 spray  2 spray Each Nare Daily Dahal, Reichert, MD   2 spray at 02/29/24 9167   gabapentin  (NEURONTIN ) capsule 600 mg  600 mg Oral BID Sim Emery L, MD   600 mg at 02/29/24 9171   hydrALAZINE  (APRESOLINE ) injection 10 mg  10 mg Intravenous Q4H PRN Sim Emery CROME, MD       hydrALAZINE  (APRESOLINE ) tablet 10 mg  10 mg Oral Q8H Gherghe, Costin M, MD   10 mg at 02/29/24 1331   latanoprost  (XALATAN ) 0.005 % ophthalmic solution 1 drop  1 drop Both Eyes QHS Dahal, Binaya, MD       meropenem  (MERREM ) 1 g in sodium chloride  0.9 % 100 mL IVPB  1 g Intravenous Q12H Reome, Earle J, RPH 200 mL/hr at 02/29/24 1224 1 g at 02/29/24 1224   metoprolol  succinate (TOPROL -XL) 24 hr tablet 12.5 mg  12.5 mg Oral Daily Sim Emery L, MD   12.5 mg at 02/29/24 0829   morphine  (PF) 2 MG/ML injection 2 mg  2 mg Intravenous Q2H PRN Sim Emery CROME, MD   2 mg at 02/27/24 2004   Muscle Rub CREA 1 Application  1 Application Topical BID Dahal, Binaya, MD   1 Application at  02/29/24 9167   ondansetron  (ZOFRAN ) tablet 4 mg  4 mg Oral Q6H PRN Sim Emery CROME, MD       Or   ondansetron  (ZOFRAN ) injection 4 mg  4 mg Intravenous Q6H PRN Sim Emery CROME, MD   4 mg at 02/27/24 2004   oxyCODONE  (Oxy IR/ROXICODONE ) immediate release tablet 5 mg  5 mg Oral Q6H PRN Dahal, Reichert, MD       pantoprazole  (PROTONIX ) EC tablet 40 mg  40 mg Oral QHS Dahal, Reichert, MD   40 mg at 02/28/24 2211   pravastatin  (PRAVACHOL ) tablet 40 mg  40 mg Oral QHS Garba, Mohammad L, MD   40 mg at 02/28/24  2210    Allergies: Allergies  Allergen Reactions   Lyrica  [Pregabalin ] Other (See Comments)    Felt loopy   Zocor  [Simvastatin ] Other (See Comments)    Myalgias   Relafen [Nabumetone] Rash    Social History: Social History   Tobacco Use   Smoking status: Never   Smokeless tobacco: Never  Vaping Use   Vaping status: Never Used  Substance Use Topics   Alcohol  use: No    Alcohol /week: 0.0 standard drinks of alcohol    Drug use: No    Family History Family History  Problem Relation Age of Onset   Arthritis Mother    Heart attack Father    Hypertension Sister    Lung cancer Sister    Asthma Sister    Allergies Sister     Review of Systems  Unable to perform ROS: Medical condition     Objective   Vital signs in last 24 hours: BP (!) 118/56   Pulse 90   Temp 98.8 F (37.1 C) (Oral)   Resp 17   Wt 89 kg   SpO2 99%   BMI 31.67 kg/m   Physical Exam General: Alert, unable to assess HEENT: [place]/AT Pulmonary: Normal work of breathing Cardiovascular: no cyanosis Abdomen: Soft, NTTP, nondistended GU: Pure wick in place draining clear yellow urine   Most Recent Labs: Lab Results  Component Value Date   WBC 6.6 02/28/2024   HGB 12.2 02/28/2024   HCT 39.2 02/28/2024   PLT 155 02/28/2024    Lab Results  Component Value Date   NA 140 02/29/2024   K 4.2 02/29/2024   CL 106 02/29/2024   CO2 25 02/29/2024   BUN 17 02/29/2024   CREATININE 1.26 (H) 02/29/2024    CALCIUM  9.2 02/29/2024   MG 2.1 02/27/2024   PHOS 3.5 06/06/2020    Lab Results  Component Value Date   INR 1.9 (A) 07/06/2019   APTT 38 (H) 02/01/2014     Urine Culture: @[id number] (laburin,org,[id number],[id number])@   IMAGING: CT ABDOMEN PELVIS W CONTRAST Result Date: 02/27/2024 CLINICAL DATA:  Acute nonlocalized abdominal pain. Nausea and vomiting. EXAM: CT ABDOMEN AND PELVIS WITH CONTRAST TECHNIQUE: Multidetector CT imaging of the abdomen and pelvis was performed using the standard protocol following bolus administration of intravenous contrast. RADIATION DOSE REDUCTION: This exam was performed according to the departmental dose-optimization program which includes automated exposure control, adjustment of the mA and/or kV according to patient size and/or use of iterative reconstruction technique. CONTRAST:  75mL OMNIPAQUE  IOHEXOL  350 MG/ML SOLN COMPARISON:  MRI abdomen 09/07/2023. CT abdomen and pelvis 09/06/2023. FINDINGS: Lower chest: Atelectasis or scarring in the lung bases. Cardiac enlargement. Small esophageal hiatal hernia. Hepatobiliary: No focal liver abnormality is seen. No gallstones, gallbladder wall thickening, or biliary dilatation. Pancreas: Unremarkable. No pancreatic ductal dilatation or surrounding inflammatory changes. Spleen: Normal in size without focal abnormality. Adrenals/Urinary Tract: No adrenal gland nodules. Marked hydronephrosis on the left, similar to prior study. Several peripheral calcifications are also unchanged no wall thickening or stranding is seen today. The ureter is decompressed. Right kidney appears normal. There is diffuse bladder wall thickening with gas in the bladder. This could represent cystitis with gas-forming organism or it could indicate sequela of prior catheterization. Gas was present previously in the bladder as well. Stomach/Bowel: Stomach, small bowel, and colon are mostly decompressed. No wall thickening or inflammatory stranding appreciated.  The appendix is normal. Diverticulosis of the sigmoid colon without evidence of acute diverticulitis. Vascular/Lymphatic: Aortic atherosclerosis. No enlarged abdominal or  pelvic lymph nodes. Reproductive: Status post hysterectomy. No adnexal masses. Other: No abdominal wall hernia or abnormality. No abdominopelvic ascites. Musculoskeletal: Postoperative changes with posterior laminectomies and fusion from the lower thoracic spine through the sacrum. Diffuse bone demineralization. No acute fracture is identified. Curvilinear calcification in the superior right femoral head may indicate early avascular necrosis. IMPRESSION: 1. 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 2. Nonobstructing intrarenal stones. 3. Bladder wall thickening with gas in the bladder likely indicating cystitis. Correlate with urinalysis. 4. Small esophageal hiatal hernia. 5. Diffuse aortic atherosclerosis. Electronically Signed   By: Elsie Gravely M.D.   On: 02/27/2024 17:46    ------  Ole Bourdon, NP Pager: [phone]   Please contact the urology consult pager with any further questions/concerns.  I have seen and examined the patient and agree with the above assessment and plan.  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  She should resolve this infection with appropriate antibiotic therapy and no urologic intervention is needed as long as she continues to clinically improve as she is doing.  She should be discharged with 7-10 days of culture specific antibiotic therapy and follow up with Dr. Elisabeth as scheduled.  Please call if further questions.

## 2024-02-29 NOTE — Evaluation (Signed)
 Physical Therapy Evaluation Patient Details Name: [name] MRN: [id number] DOB: 18-Sep-1940 Today's Date: 02/29/2024  History of Present Illness  [name] is a 83 y.o. female presented to [facility] ED 02/27/24 for significant abdominal pain, n/v, and diarrhea. 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 PMHx: OSA, GERD, essential HTN, HLD,  lumbar stenosis, paroxysmal atrial fibrillation, previous UTI, diastolic CHF, paroxysmal A-fib, CHF, GERD, scoliosis, lumbar stenosis, h/o epidural abscess, peripheral neuropathy, ESBL UTI.   Clinical Impression  Pt admitted with above diagnosis. PTA, pt required 1+ assist for functional mobility, ADLs, and IADLs. She completed stand pivot transfers using RW and would ambulate short-distances 3x/week using RW. Pt is a long-term resident at [facility]. Pt currently with functional limitations due to the deficits listed below (see PT Problem List). She required modA for bed mobility, modA x2 for sit<>stand using RW, and minA x2 for bed>chair and in room ambulation using RW. Pt demonstrated generalized deconditioning and decreased activity tolerance. Pt will benefit from acute skilled PT to increase her independence and safety with mobility to allow discharge. Recommend continued inpatient follow up therapy, <3 hours/day.     If plan is discharge home, recommend the following: Two people to help with walking and/or transfers;A lot of help with bathing/dressing/bathroom;Assistance with cooking/housework;Assist for transportation;Help with stairs or ramp for entrance   Can travel by private vehicle   No    Equipment Recommendations None recommended by PT  Recommendations for Other Services       Functional Status Assessment Patient has had a recent decline  in their functional status and/or demonstrates limited ability to make significant improvements in function in a reasonable and predictable amount of time     Precautions / Restrictions Precautions Precautions: Fall Recall of Precautions/Restrictions: Intact      Mobility  Bed Mobility Overal bed mobility: Needs Assistance Bed Mobility: Rolling, Supine to Sit Rolling: Used rails, Mod assist   Supine to sit: Mod assist, HOB elevated, Used rails     General bed mobility comments: Pt rolled to R with use of bedrail for RN to complete skin check. Cues for sequencing and guide to bring hand on to support. Assist to bring BLE off EOB and elevate trunk. Pt was able to slowly scoot fwd.    Transfers Overall transfer level: Needs assistance Equipment used: Rolling walker (2 wheels) Transfers: Sit to/from Stand, Bed to chair/wheelchair/BSC Sit to Stand: Mod assist, +2 physical assistance   Step pivot transfers: Min assist, +2 physical assistance, +2 safety/equipment       General transfer comment: Pt stood from lowest bed height. Cued proper hand placement using RW, scooting fwd to edge of surface, increase fwd lean, and increase B knee flex. Pt powered up with modA x2. She initially demonstrated a posterior bias, able to correct and acheive neutral posture. Pt transferred to recliner chair on her right.    Ambulation/Gait Ambulation/Gait assistance: Min assist, +2 safety/equipment (Chair Follow) Gait Distance (Feet): 5 Feet Assistive device: Rolling walker (2 wheels) Gait Pattern/deviations: Step-to pattern, Decreased stride length, Decreased dorsiflexion - left Gait velocity: decreased     General Gait Details: Pt ambulated with short slow steps wearing metal upright AFO on LLE. She maintained upright posture and good proximity to RW. Limited heel strike and push off bilat. Pt veered to the left, assist to manuever RW straight ahead. Distance  limited d/t fatigue.  Stairs             Wheelchair Mobility     Tilt Bed    Modified Rankin (Stroke Patients Only)       Balance Overall balance assessment: Needs assistance Sitting-balance support: Bilateral upper extremity supported, Feet supported Sitting balance-Leahy Scale: Fair     Standing balance support: Bilateral upper extremity supported, During functional activity, Reliant on assistive device for balance Standing balance-Leahy Scale: Poor Standing balance comment: Pt dependent on RW and +2 assist.                             Pertinent Vitals/Pain Pain Assessment Pain Assessment: 0-10 Pain Score: 6  Pain Location: BLEs Pain Descriptors / Indicators: Discomfort, Aching, Sore Pain Intervention(s): Monitored during session, Limited activity within patient's tolerance, Repositioned    Home Living Family/patient expects to be discharged to:: Skilled nursing facility                   Additional Comments: Long-term resident at [facility] going on 3 years    Prior Function Prior Level of Function : Needs assist       Physical Assist : Mobility (physical);ADLs (physical) Mobility (physical): Bed mobility;Transfers;Gait ADLs (physical): Bathing;Dressing;Toileting;IADLs;Feeding;Grooming Mobility Comments: Per daughter, pt required 1+ assist to get OOB and transfer via stand pivot using RW. Pt's therapy had ended but staff have been walking her 3x/week a short-distance (~10-22ft). Daughter reports pt was previously transferring using hoyer lift ~41mo ago. ADLs Comments: Per daughter, pt recieves a sponge bath daily and sauna bath in the tub1x/week. For toileting, pt requires 1+ assist using RW to stand pivot transfer into bathroom. Pt is able to assist with dressing and grooming. Daughter states she has been having a harder time with feeding herself d/t sickness. Pt relies on staff assistance for all IADLs, med management, and meals.     Extremity/Trunk Assessment   Upper Extremity  Assessment Upper Extremity Assessment: Generalized weakness;Right hand dominant    Lower Extremity Assessment Lower Extremity Assessment: Generalized weakness (hx of L foot drop with inversion at baseline; hx of peripheral neuropathy)    Cervical / Trunk Assessment Cervical / Trunk Assessment: Normal  Communication   Communication Communication: Impaired Factors Affecting Communication: Difficulty expressing self    Cognition Arousal: Alert Behavior During Therapy: WFL for tasks assessed/performed   PT - Cognitive impairments: No apparent impairments                       PT - Cognition Comments: Pt A,Ox4 Following commands: Intact       Cueing Cueing Techniques: Verbal cues, Gestural cues     General Comments      Exercises     Assessment/Plan    PT Assessment Patient needs continued PT services  PT Problem List Decreased strength;Decreased activity tolerance;Decreased balance;Decreased mobility       PT Treatment Interventions DME instruction;Gait training;Functional mobility training;Therapeutic activities;Therapeutic exercise;Balance training;Patient/family education    PT Goals (Current goals can be found in the Care Plan section)  Acute Rehab PT Goals Patient Stated Goal: Maintain strength and ability to transfer PT Goal Formulation: With patient/family Time For Goal Achievement: 03/14/24 Potential to Achieve Goals: Fair    Frequency Min 2X/week     Co-evaluation               AM-PAC PT 6 Clicks Mobility  Outcome Measure Help needed  turning from your back to your side while in a flat bed without using bedrails?: A Lot Help needed moving from lying on your back to sitting on the side of a flat bed without using bedrails?: A Lot Help needed moving to and from a bed to a chair (including a wheelchair)?: Total Help needed standing up from a chair using your arms (e.g., wheelchair or bedside chair)?: Total Help needed to walk in  hospital room?: Total Help needed climbing 3-5 steps with a railing? : Total 6 Click Score: 8    End of Session Equipment Utilized During Treatment: Gait belt Activity Tolerance: Patient tolerated treatment well;Patient limited by fatigue Patient left: in chair;with call bell/phone within reach;with family/visitor present Nurse Communication: Mobility status;Need for lift equipment (stedy may be helpful) PT Visit Diagnosis: Muscle weakness (generalized) (M62.81);Difficulty in walking, not elsewhere classified (R26.2)    Time: 8856-8792 PT Time Calculation (min) (ACUTE ONLY): 24 min   Charges:   PT Evaluation $PT Eval Moderate Complexity: 1 Mod   PT General Charges $$ ACUTE PT VISIT: 1 Visit         Randall SAUNDERS, PT, DPT Acute Rehabilitation Services Office: [phone] Secure Chat Preferred  Delon CHRISTELLA Callander 02/29/2024, 1:53 PM

## 2024-03-01 DIAGNOSIS — N3000 Acute cystitis without hematuria: Secondary | ICD-10-CM | POA: Diagnosis not present

## 2024-03-01 DIAGNOSIS — N3080 Other cystitis without hematuria: Secondary | ICD-10-CM | POA: Diagnosis not present

## 2024-03-01 DIAGNOSIS — B9620 Unspecified Escherichia coli [E. coli] as the cause of diseases classified elsewhere: Secondary | ICD-10-CM | POA: Diagnosis not present

## 2024-03-01 DIAGNOSIS — N132 Hydronephrosis with renal and ureteral calculous obstruction: Secondary | ICD-10-CM | POA: Diagnosis not present

## 2024-03-01 LAB — COMPREHENSIVE METABOLIC PANEL WITH GFR
ALT: 10 U/L (ref 0–44)
AST: 17 U/L (ref 15–41)
Albumin: 3.5 g/dL (ref 3.5–5.0)
Alkaline Phosphatase: 91 U/L (ref 38–126)
Anion gap: 12 (ref 5–15)
BUN: 20 mg/dL (ref 8–23)
CO2: 21 mmol/L — ABNORMAL LOW (ref 22–32)
Calcium: 9.1 mg/dL (ref 8.9–10.3)
Chloride: 107 mmol/L (ref 98–111)
Creatinine, Ser: 1.26 mg/dL — ABNORMAL HIGH (ref 0.44–1.00)
GFR, Estimated: 43 mL/min — ABNORMAL LOW (ref >60)
Glucose, Bld: 103 mg/dL — ABNORMAL HIGH (ref 70–99)
Potassium: 4.1 mmol/L (ref 3.5–5.1)
Sodium: 140 mmol/L (ref 135–145)
Total Bilirubin: 0.6 mg/dL (ref 0.0–1.2)
Total Protein: 6.5 g/dL (ref 6.5–8.1)

## 2024-03-01 LAB — CBC
HCT: 39.2 % (ref 36.0–46.0)
Hemoglobin: 12.2 g/dL (ref 12.0–15.0)
MCH: 26.9 pg (ref 26.0–34.0)
MCHC: 31.1 g/dL (ref 30.0–36.0)
MCV: 86.3 fL (ref 80.0–100.0)
Platelets: 173 K/uL (ref 150–400)
RBC: 4.54 MIL/uL (ref 3.87–5.11)
RDW: 15.9 % — ABNORMAL HIGH (ref 11.5–15.5)
WBC: 6.3 K/uL (ref 4.0–10.5)
nRBC: 0 % (ref 0.0–0.2)

## 2024-03-01 LAB — URINE CULTURE: Culture: >=100000 — AB

## 2024-03-01 LAB — MAGNESIUM: Magnesium: 2.1 mg/dL (ref 1.7–2.4)

## 2024-03-01 MED ORDER — SENNOSIDES-DOCUSATE SODIUM 8.6-50 MG PO TABS
2.0000 | ORAL_TABLET | Freq: Two times a day (BID) | ORAL | Status: DC
  Administered 2024-03-01 – 2024-03-02 (×3): 2 via ORAL
  Filled 2024-03-01 (×3): qty 2

## 2024-03-01 MED ORDER — CEFADROXIL 500 MG PO CAPS
500.0000 mg | ORAL_CAPSULE | Freq: Two times a day (BID) | ORAL | Status: DC
  Administered 2024-03-01 – 2024-03-02 (×2): 500 mg via ORAL
  Filled 2024-03-01 (×3): qty 1

## 2024-03-01 MED ORDER — GLYCERIN (LAXATIVE) 2 G RE SUPP: 1.0000 | Freq: Every day | RECTAL | Status: DC | PRN

## 2024-03-01 MED ORDER — POLYETHYLENE GLYCOL 3350 17 G PO PACK
17.0000 g | PACK | Freq: Every day | ORAL | Status: DC
Start: 2024-03-01 — End: 2024-03-02
  Administered 2024-03-01 – 2024-03-02 (×2): 17 g via ORAL
  Filled 2024-03-01 (×2): qty 1

## 2024-03-01 NOTE — Plan of Care (Signed)
  Problem: Education: Goal: Knowledge of General Education information will improve Description: Including pain rating scale, medication(s)/side effects and non-pharmacologic comfort measures Outcome: Progressing   Problem: Nutrition: Goal: Adequate nutrition will be maintained Outcome: Progressing   Problem: Coping: Goal: Level of anxiety will decrease Outcome: Progressing   Problem: Pain Managment: Goal: General experience of comfort will improve and/or be controlled Outcome: Progressing   Problem: Skin Integrity: Goal: Risk for impaired skin integrity will decrease Outcome: Progressing

## 2024-03-01 NOTE — Care Management Important Message (Signed)
 Important Message  Patient Details  Name: [name] MRN: [id number] Date of Birth: Jul 17, 1940   Important Message Given:  Yes - Medicare IM     Jon Cruel 03/01/2024, 2:09 PM

## 2024-03-01 NOTE — Plan of Care (Signed)
 No complaints of pain on my shift Problem: Education: Goal: Knowledge of General Education information will improve Description: Including pain rating scale, medication(s)/side effects and non-pharmacologic comfort measures Outcome: Progressing   Problem: Health Behavior/Discharge Planning: Goal: Ability to manage health-related needs will improve Outcome: Progressing   Problem: Clinical Measurements: Goal: Ability to maintain clinical measurements within normal limits will improve Outcome: Progressing Goal: Will remain free from infection Outcome: Progressing Goal: Diagnostic test results will improve Outcome: Progressing Goal: Respiratory complications will improve Outcome: Progressing Goal: Cardiovascular complication will be avoided Outcome: Progressing   Problem: Activity: Goal: Risk for activity intolerance will decrease Outcome: Progressing   Problem: Nutrition: Goal: Adequate nutrition will be maintained Outcome: Progressing   Problem: Coping: Goal: Level of anxiety will decrease Outcome: Progressing   Problem: Elimination: Goal: Will not experience complications related to bowel motility Outcome: Progressing Goal: Will not experience complications related to urinary retention Outcome: Progressing   Problem: Pain Managment: Goal: General experience of comfort will improve and/or be controlled Outcome: Progressing   Problem: Safety: Goal: Ability to remain free from injury will improve Outcome: Progressing   Problem: Skin Integrity: Goal: Risk for impaired skin integrity will decrease Outcome: Progressing

## 2024-03-01 NOTE — Progress Notes (Signed)
 PROGRESS NOTE  [name] FMW:[id number] DOB: 1940-11-18 DOA: 02/27/2024 PCP: Yolande Toribio MATSU, MD   LOS: 3 days   Brief Narrative / Interim history: [name] is a 83 y.o. female with PMH significant for OSA, HTN, HLD, paroxysmal A-fib, CHF, GERD, scoliosis, lumbar stenosis, h/o epidural abscess, peripheral neuropathy, ESBL UTI. Long-term resident at [facility] nursing home, bedbound status but able to transfer herself in and out of bed with upper body strength. 9/22, patient presented to the ED with complaint of severe abdominal pain, nausea, vomiting, diarrhea. 2 weeks ago, diagnosed with UTI in her nursing home, per daughter she grew ESBL bacteria, completed 1 week course of antibiotics but unclear to me what antibiotics she was on.  On the day of admission the patient had sudden onset left lower quadrant pain, without any urinary symptoms. 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 It also showed, bladder wall thickening with gas in the bladder likely indicating cystitis.  Subjective / 24h Interval events: Feels tired, but is alert, awake, tells me she slept okay  Assesement and Plan: Principal problem UTI - Recently completed 1 week course of antibiotics for UTI at the nursing home. Called and discussed with [facility], culture data from 8/22 showed ESBL Klebsiella, sensitive to Cipro and levo - floxacin, meropenem .  She completed 7 days of ciprofloxacin - Urine cultures here growing E. coli which is not ESBL.  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 Per daughters, patient  was seen by urologist in the office after last hospitalization in April.  No intervention was recommended.  She is due for 71-month follow-up in October. - Discussed with urology, no interventions are recommended, outpatient follow-up   Chronic diastolic CHF, Uncontrolled hypertension -Blood pressure was elevated over 200 in the ED on arrival, continue metoprolol .  Blood pressure still elevated, added hydralazine  yesterday, better today  Paroxysmal A-fib - Continue Flecainide  50 mg daily. Continue chronic anticoagulation with Eliquis  2.5 mg twice daily   CKD 3A - Creatinine at baseline less than 1.5.  Parkinson's disease - Continue Sinemet    HLD - Continue Pletal , pravastatin    OSA - Nightly CPAP   Peripheral neuropathy - Continue Neurontin  600 mg twice daily, scheduled Tylenol  and local analgesic cream   Generalized weakness  Lumbar stenosis Bedbound status and nursing home  - Long-term resident at [facility] nursing home, bedbound status but able to transfer herself in and out of bed with upper body strength. PT eval ordered  Scheduled Meds:  acetaminophen   1,000 mg Oral BID   apixaban   2.5 mg Oral BID   capsicum   Topical BID   carbidopa -levodopa   2 tablet Oral 3 times per day   cefadroxil   500 mg Oral BID   cholecalciferol   4,000 Units Oral Daily   cilostazol   50 mg Oral BID   feeding supplement  237 mL Oral BID   flecainide   50 mg Oral Daily   fluticasone   2 spray Each Nare Daily   gabapentin   600 mg Oral BID   hydrALAZINE   10 mg Oral Q8H   latanoprost   1 drop Both Eyes QHS   metoprolol  succinate  12.5 mg Oral  Daily   Muscle Rub  1 Application Topical BID   pantoprazole   40 mg Oral QHS   polyethylene glycol  17 g Oral Daily   pravastatin   40 mg Oral QHS   senna-docusate  2 tablet Oral BID   Continuous Infusions:   PRN Meds:.Glycerin  (Adult), hydrALAZINE , morphine  injection, ondansetron  **OR** ondansetron  (ZOFRAN ) IV, oxyCODONE   Current Outpatient Medications   Medication Instructions   acetaminophen  (TYLENOL ) 1,000 mg, 2 times daily   B Complex-Folic Acid  (SUPER B COMPLEX MAXI) TABS 1 tablet, Daily   capsicum (ZOSTRIX) 0.075 % topical cream Topical, 2 times daily   carbidopa -levodopa  (SINEMET  IR) 25-100 MG tablet 3 at 8am/2 at noon/2 at 4pm x 2 weeks and then take 2 tablets at 8am/noon/4pm   Carboxymethylcellulose Sodium (ARTIFICIAL TEARS OP) 1 drop, Both Eyes, 2 times daily   cetirizine (ZYRTEC) 10 mg, Daily PRN   Cholecalciferol  4,000 Units, Daily   cilostazol  (PLETAL ) 50 mg, 2 times daily   Eliquis  2.5 mg, 2 times daily   flecainide  (TAMBOCOR ) 50 mg, Daily   fluticasone  (FLONASE ) 50 MCG/ACT nasal spray 2 sprays, Daily   furosemide  (LASIX ) 20 MG tablet TAKE 1 TABLET(20 MG) BY MOUTH DAILY   gabapentin  (NEURONTIN ) 600 mg, Oral, 2 times daily   Lactobacillus Rhamnosus, GG, (CULTURELLE KIDS) PACK 1 tablet, Daily   Lidocaine  HCl (ASPERCREME LIDOCAINE ) 4 % CREA 1 Application, 2 times daily   metoprolol  succinate (TOPROL -XL) 12.5 mg, Every evening   Nutritional Supplements (BOOST ORIGINAL) LIQD 8 oz, 2 times daily   pantoprazole  (PROTONIX ) 40 mg, Oral, Daily at bedtime   Pollen Extracts (PROSTAT PO) 30 mLs, 2 times daily   potassium chloride  SA (K-DUR) 20 MEQ tablet 20 mEq, Oral, Daily   pravastatin  (PRAVACHOL ) 40 mg, Daily at bedtime   TRAVATAN Z 0.004 % SOLN ophthalmic solution 1 drop, Daily at bedtime   trolamine salicylate (ASPERCREME) 10 % cream 1 Application, Topical, 2 times daily    Diet Orders (From admission, onward)     Start     Ordered   02/27/24 1923  Diet Heart Room service appropriate? Yes; Fluid consistency: Thin  Diet effective now       Question Answer Comment  Room service appropriate? Yes   Fluid consistency: Thin      02/27/24 1923            DVT prophylaxis: apixaban  (ELIQUIS ) tablet 2.5 mg Start: 02/28/24 1215 apixaban  (ELIQUIS ) tablet 2.5 mg   Lab Results  Component Value Date   PLT 173 03/01/2024       Code Status: Limited: Do not attempt resuscitation (DNR) -DNR-LIMITED -Do Not Intubate/DNI   Family Communication: Daughter present at bedside  Status is: Inpatient Remains inpatient appropriate because: IV antibiotics   Level of care: Med-Surg  Consultants:  None  Objective: Vitals:   02/29/24 1557 02/29/24 1952 03/01/24 0456 03/01/24 0818  BP: (!) 128/58 (!) 128/51 (!) 159/52 (!) 149/52  Pulse: 76 82 79 71  Resp: 16 20 18 18   Temp: (!) 97.5 F (36.4 C) 98.8 F (37.1 C) 98 F (36.7 C) 98.2 F (36.8 C)  TempSrc: Oral Oral Oral Oral  SpO2: 98% 100% 99% 100%  Weight:        Intake/Output Summary (Last 24 hours) at 03/01/2024 1222 Last data filed at 03/01/2024 0800 Gross per 24 hour  Intake 460 ml  Output 850 ml  Net -390 ml   Wt Readings from Last 3 Encounters:  02/29/24 89 kg  09/06/23 88 kg  07/26/23 86.6 kg    Examination:  Constitutional: NAD Eyes: lids and conjunctivae normal, no scleral icterus ENMT: mmm Neck: normal, supple Respiratory: clear to auscultation bilaterally, no wheezing, no crackles. Normal respiratory effort.  Cardiovascular: Regular rate and rhythm, no murmurs / rubs / gallops. No LE edema. Abdomen: soft, no distention, no tenderness. Bowel sounds positive.   Data Reviewed: I have independently reviewed following labs and imaging studies   CBC Recent Labs  Lab 02/27/24 1522 02/27/24 2027 02/28/24 0307 03/01/24 0322  WBC 9.0 7.6 6.6 6.3  HGB 11.9* 12.1 12.2 12.2  HCT 39.0 38.4 39.2 39.2  PLT 186 172 155 173  MCV 87.6 85.0 87.1 86.3  MCH 26.7 26.8 27.1 26.9  MCHC 30.5 31.5 31.1 31.1  RDW 15.5 15.8* 15.8* 15.9*  LYMPHSABS 1.1  --   --   --   MONOABS 0.6  --   --   --   EOSABS 0.1  --   --   --   BASOSABS 0.0  --   --   --     Recent Labs  Lab 02/27/24 1522 02/27/24 1537 02/27/24 2027 02/28/24 0307 02/29/24 0317 03/01/24 0322  NA 139  --   --  142 140 140  K 4.4  --   --  4.2 4.2 4.1  CL 104  --   --  108 106 107   CO2 24  --   --  21* 25 21*  GLUCOSE 121*  --   --  86 110* 103*  BUN 27*  --   --  21 17 20   CREATININE 1.58*  --  1.50* 1.40* 1.26* 1.26*  CALCIUM  9.1  --   --  9.1 9.2 9.1  AST 23  --   --  16  --  17  ALT 13  --   --  17  --  10  ALKPHOS 104  --   --  93  --  91  BILITOT 0.5  --   --  0.5  --  0.6  ALBUMIN 3.8  --   --  3.6  --  3.5  MG 2.1  --   --   --   --  2.1  LATICACIDVEN  --  1.5  --   --   --   --     ------------------------------------------------------------------------------------------------------------------ No results for input(s): CHOL, HDL, LDLCALC, TRIG, CHOLHDL, LDLDIRECT in the last 72 hours.  Lab Results  Component Value Date   HGBA1C 6.0 (H) 02/27/2013   ------------------------------------------------------------------------------------------------------------------ No results for input(s): TSH, T4TOTAL, T3FREE, THYROIDAB in the last 72 hours.  Invalid input(s): FREET3  Cardiac Enzymes No results for input(s): CKMB, TROPONINI, MYOGLOBIN in the last 168 hours.  Invalid input(s): CK ------------------------------------------------------------------------------------------------------------------    Component Value Date/Time   BNP 43.7 12/25/2019 2205    CBG: No results for input(s): GLUCAP in the last 168 hours.  Recent Results (from the past 240 hours)  Resp panel by RT-PCR (RSV, Flu A&B, Covid) Anterior Nasal Swab     Status: None   Collection Time: 02/27/24  1:33 PM   Specimen: Anterior Nasal Swab  Result Value Ref Range Status   SARS Coronavirus 2 by RT PCR NEGATIVE NEGATIVE Final   Influenza A by PCR NEGATIVE NEGATIVE Final   Influenza B by PCR NEGATIVE NEGATIVE Final    Comment: (NOTE) The Xpert Xpress SARS-CoV-2/FLU/RSV plus assay is intended as an aid in the diagnosis of influenza from Nasopharyngeal swab specimens and  should not be used as a sole basis for treatment. Nasal washings and aspirates are  unacceptable for Xpert Xpress SARS-CoV-2/FLU/RSV testing.  Fact Sheet for Patients: BloggerCourse.com  Fact Sheet for Healthcare Providers: SeriousBroker.it  This test is not yet approved or cleared by the [place]  FDA and has been authorized for detection and/or diagnosis of SARS-CoV-2 by FDA under an Emergency Use Authorization (EUA). This EUA will remain in effect (meaning this test can be used) for the duration of the COVID-19 declaration under Section 564(b)(1) of the Act, 21 U.S.C. section 360bbb-3(b)(1), unless the authorization is terminated or revoked.     Resp Syncytial Virus by PCR NEGATIVE NEGATIVE Final    Comment: (NOTE) Fact Sheet for Patients: BloggerCourse.com  Fact Sheet for Healthcare Providers: SeriousBroker.it  This test is not yet approved or cleared by the [place]  FDA and has been authorized for detection and/or diagnosis of SARS-CoV-2 by FDA under an Emergency Use Authorization (EUA). This EUA will remain in effect (meaning this test can be used) for the duration of the COVID-19 declaration under Section 564(b)(1) of the Act, 21 U.S.C. section 360bbb-3(b)(1), unless the authorization is terminated or revoked.  Performed at [facility], [address] [place], [place] [address]   Urine Culture (for pregnant, neutropenic or urologic patients or patients with an indwelling urinary catheter)     Status: Abnormal   Collection Time: 02/28/24  8:15 AM   Specimen: Urine, Clean Catch  Result Value Ref Range Status   Specimen Description URINE, CLEAN CATCH  Final   Special Requests   Final    NONE Performed at [facility], [address], [place], [place] [address]    Culture >=100,000 COLONIES/mL ESCHERICHIA COLI (A)  Final   Report Status 03/01/2024 FINAL  Final   Organism ID, Bacteria ESCHERICHIA COLI (A)  Final       Susceptibility   Escherichia coli - MIC*    AMPICILLIN >=32 RESISTANT Resistant     CEFAZOLIN  (URINE) Value in next row Sensitive      8 SENSITIVEThis is a modified FDA-approved test that has been validated and its performance characteristics determined by the reporting laboratory.  This laboratory is certified under the Clinical Laboratory Improvement Amendments CLIA as qualified to perform high complexity clinical laboratory testing.    CEFEPIME  Value in next row Sensitive      8 SENSITIVEThis is a modified FDA-approved test that has been validated and its performance characteristics determined by the reporting laboratory.  This laboratory is certified under the Clinical Laboratory Improvement Amendments CLIA as qualified to perform high complexity clinical laboratory testing.    ERTAPENEM Value in next row Sensitive      8 SENSITIVEThis is a modified FDA-approved test that has been validated and its performance characteristics determined by the reporting laboratory.  This laboratory is certified under the Clinical Laboratory Improvement Amendments CLIA as qualified to perform high complexity clinical laboratory testing.    CEFTRIAXONE  Value in next row Sensitive      8 SENSITIVEThis is a modified FDA-approved test that has been validated and its performance characteristics determined by the reporting laboratory.  This laboratory is certified under the Clinical Laboratory Improvement Amendments CLIA as qualified to perform high complexity clinical laboratory testing.    CIPROFLOXACIN Value in next row Resistant      8 SENSITIVEThis is a modified FDA-approved test that has been validated and its performance characteristics determined by the reporting laboratory.  This laboratory is certified  under the Clinical Laboratory Improvement Amendments CLIA as qualified to perform high complexity clinical laboratory testing.    GENTAMICIN Value in next row Sensitive      8 SENSITIVEThis is a modified  FDA-approved test that has been validated and its performance characteristics determined by the reporting laboratory.  This laboratory is certified under the Clinical Laboratory Improvement Amendments CLIA as qualified to perform high complexity clinical laboratory testing.    NITROFURANTOIN Value in next row Sensitive      8 SENSITIVEThis is a modified FDA-approved test that has been validated and its performance characteristics determined by the reporting laboratory.  This laboratory is certified under the Clinical Laboratory Improvement Amendments CLIA as qualified to perform high complexity clinical laboratory testing.    TRIMETH /SULFA  Value in next row Resistant      8 SENSITIVEThis is a modified FDA-approved test that has been validated and its performance characteristics determined by the reporting laboratory.  This laboratory is certified under the Clinical Laboratory Improvement Amendments CLIA as qualified to perform high complexity clinical laboratory testing.    AMPICILLIN/SULBACTAM Value in next row Intermediate      8 SENSITIVEThis is a modified FDA-approved test that has been validated and its performance characteristics determined by the reporting laboratory.  This laboratory is certified under the Clinical Laboratory Improvement Amendments CLIA as qualified to perform high complexity clinical laboratory testing.    PIP/TAZO Value in next row Sensitive      16 SENSITIVEThis is a modified FDA-approved test that has been validated and its performance characteristics determined by the reporting laboratory.  This laboratory is certified under the Clinical Laboratory Improvement Amendments CLIA as qualified to perform high complexity clinical laboratory testing.    MEROPENEM  Value in next row Sensitive      16 SENSITIVEThis is a modified FDA-approved test that has been validated and its performance characteristics determined by the reporting laboratory.  This laboratory is certified under the  Clinical Laboratory Improvement Amendments CLIA as qualified to perform high complexity clinical laboratory testing.    * >=100,000 COLONIES/mL ESCHERICHIA COLI     Radiology Studies: No results found.   Nilda Fendt, MD, PhD [facility]  Between 7 am - 7 pm I am available, please contact me via Amion (for emergencies) or Securechat (non urgent messages)  Between 7 pm - 7 am I am not available, please contact night coverage MD/APP via Amion

## 2024-03-01 NOTE — Progress Notes (Signed)
 [facility]  Date of Admission:  02/27/2024       A/p Emphysematous cystitis Hydronephrosis Ureteral stone     Anticipated prolonged course for emphysematous cystitis and planned repeat imaging in around 2-3 weeks Duration a moving target   Urology input reviewed -- plan conservative management with outpatient f/u  Ucx ecoli that is not esbl and sensitive to cefazoli  Feels better    -cefadroxil  1 month supply -f/u id clinic in 3 weeks and plan repeat ct abd pelv to make sure resolution emphysematous chage prior to stopping abx -Maintain contact isolation precaution -id clinic f/u arranged for 10/20 @ 915 -would also benefit from outpatient f/u with urology -id will sign off -Discussed with primary team    Principal Problem:   UTI (urinary tract infection) Active Problems:   Hyperlipidemia   ATRIAL FIBRILLATION   Obstructive sleep apnea   Parkinsonism (HCC)   Stage 3a chronic kidney disease (HCC)   GERD (gastroesophageal reflux disease)   Essential hypertension   Allergies  Allergen Reactions   Lyrica  [Pregabalin ] Other (See Comments)    Felt loopy   Zocor  [Simvastatin ] Other (See Comments)    Myalgias   Relafen [Nabumetone] Rash    Scheduled Meds:  acetaminophen   1,000 mg Oral BID   apixaban   2.5 mg Oral BID   capsicum   Topical BID   carbidopa -levodopa   2 tablet Oral 3 times per day   cefadroxil   500 mg Oral BID   cholecalciferol   4,000 Units Oral Daily   cilostazol   50 mg Oral BID   feeding supplement  237 mL Oral BID   flecainide   50 mg Oral Daily   fluticasone   2 spray Each Nare Daily   gabapentin   600 mg Oral BID   hydrALAZINE   10 mg Oral Q8H   latanoprost   1 drop Both Eyes QHS   metoprolol  succinate  12.5 mg Oral Daily   Muscle Rub  1 Application Topical BID   pantoprazole   40 mg Oral QHS   polyethylene glycol  17 g Oral Daily   pravastatin   40 mg Oral QHS   senna-docusate  2 tablet Oral BID    Continuous Infusions: PRN Meds:.Glycerin  (Adult), hydrALAZINE , morphine  injection, ondansetron  **OR** ondansetron  (ZOFRAN ) IV, oxyCODONE    SUBJECTIVE: Feels better Need some sleep though Afebrile Urology evaluated Ucx ecoli  Review of Systems: ROS All other ROS was negative, except mentioned above     OBJECTIVE: Vitals:   03/01/24 0456 03/01/24 0818 03/01/24 1402 03/01/24 1558  BP: (!) 159/52 (!) 149/52 (!) 112/55 126/66  Pulse: 79 71  72  Resp: 18 18  17   Temp: 98 F (36.7 C) 98.2 F (36.8 C)  98.3 F (36.8 C)  TempSrc: Oral Oral  Axillary  SpO2: 99% 100%  100%  Weight:       Body mass index is 31.67 kg/m.  Physical Exam General/constitutional: no distress, pleasant HEENT: Normocephalic, PER, Conj Clear, EOMI, Oropharynx clear Neck supple CV: rrr no mrg Lungs: clear to auscultation, normal respiratory effort Abd: Soft, Nontender Ext: no edema Skin: No Rash Neuro: nonfocal MSK: no peripheral joint swelling/tenderness/warmth; back spines nontender     Lab Results Lab Results  Component Value Date   WBC 6.3 03/01/2024   HGB 12.2 03/01/2024   HCT 39.2 03/01/2024   MCV 86.3 03/01/2024   PLT 173 03/01/2024    Lab Results  Component Value Date   CREATININE 1.26 (H)  03/01/2024   BUN 20 03/01/2024   NA 140 03/01/2024   K 4.1 03/01/2024   CL 107 03/01/2024   CO2 21 (L) 03/01/2024    Lab Results  Component Value Date   ALT 10 03/01/2024   AST 17 03/01/2024   ALKPHOS 91 03/01/2024   BILITOT 0.6 03/01/2024      Microbiology: Recent Results (from the past 240 hours)  Resp panel by RT-PCR (RSV, Flu A&B, Covid) Anterior Nasal Swab     Status: None   Collection Time: 02/27/24  1:33 PM   Specimen: Anterior Nasal Swab  Result Value Ref Range Status   SARS Coronavirus 2 by RT PCR NEGATIVE NEGATIVE Final   Influenza A by PCR NEGATIVE NEGATIVE Final   Influenza B by PCR NEGATIVE NEGATIVE Final    Comment: (NOTE) The Xpert Xpress SARS-CoV-2/FLU/RSV  plus assay is intended as an aid in the diagnosis of influenza from Nasopharyngeal swab specimens and should not be used as a sole basis for treatment. Nasal washings and aspirates are unacceptable for Xpert Xpress SARS-CoV-2/FLU/RSV testing.  Fact Sheet for Patients: BloggerCourse.com  Fact Sheet for Healthcare Providers: SeriousBroker.it  This test is not yet approved or cleared by the [place]  FDA and has been authorized for detection and/or diagnosis of SARS-CoV-2 by FDA under an Emergency Use Authorization (EUA). This EUA will remain in effect (meaning this test can be used) for the duration of the COVID-19 declaration under Section 564(b)(1) of the Act, 21 U.S.C. section 360bbb-3(b)(1), unless the authorization is terminated or revoked.     Resp Syncytial Virus by PCR NEGATIVE NEGATIVE Final    Comment: (NOTE) Fact Sheet for Patients: BloggerCourse.com  Fact Sheet for Healthcare Providers: SeriousBroker.it  This test is not yet approved or cleared by the [place]  FDA and has been authorized for detection and/or diagnosis of SARS-CoV-2 by FDA under an Emergency Use Authorization (EUA). This EUA will remain in effect (meaning this test can be used) for the duration of the COVID-19 declaration under Section 564(b)(1) of the Act, 21 U.S.C. section 360bbb-3(b)(1), unless the authorization is terminated or revoked.  Performed at [facility], [address] [place], [place] [address]   Urine Culture (for pregnant, neutropenic or urologic patients or patients with an indwelling urinary catheter)     Status: Abnormal   Collection Time: 02/28/24  8:15 AM   Specimen: Urine, Clean Catch  Result Value Ref Range Status   Specimen Description URINE, CLEAN CATCH  Final   Special Requests   Final    NONE Performed at [facility], [address],  [place], [place] [address]    Culture >=100,000 COLONIES/mL ESCHERICHIA COLI (A)  Final   Report Status 03/01/2024 FINAL  Final   Organism ID, Bacteria ESCHERICHIA COLI (A)  Final      Susceptibility   Escherichia coli - MIC*    AMPICILLIN >=32 RESISTANT Resistant     CEFAZOLIN  (URINE) Value in next row Sensitive      8 SENSITIVEThis is a modified FDA-approved test that has been validated and its performance characteristics determined by the reporting laboratory.  This laboratory is certified under the Clinical Laboratory Improvement Amendments CLIA as qualified to perform high complexity clinical laboratory testing.    CEFEPIME  Value in next row Sensitive      8 SENSITIVEThis is a modified FDA-approved test that has been validated and its performance characteristics determined by the reporting laboratory.  This laboratory is certified under the Clinical  Laboratory Improvement Amendments CLIA as qualified to perform high complexity clinical laboratory testing.    ERTAPENEM Value in next row Sensitive      8 SENSITIVEThis is a modified FDA-approved test that has been validated and its performance characteristics determined by the reporting laboratory.  This laboratory is certified under the Clinical Laboratory Improvement Amendments CLIA as qualified to perform high complexity clinical laboratory testing.    CEFTRIAXONE  Value in next row Sensitive      8 SENSITIVEThis is a modified FDA-approved test that has been validated and its performance characteristics determined by the reporting laboratory.  This laboratory is certified under the Clinical Laboratory Improvement Amendments CLIA as qualified to perform high complexity clinical laboratory testing.    CIPROFLOXACIN Value in next row Resistant      8 SENSITIVEThis is a modified FDA-approved test that has been validated and its performance characteristics determined by the reporting laboratory.  This laboratory is certified under the Clinical Laboratory  Improvement Amendments CLIA as qualified to perform high complexity clinical laboratory testing.    GENTAMICIN Value in next row Sensitive      8 SENSITIVEThis is a modified FDA-approved test that has been validated and its performance characteristics determined by the reporting laboratory.  This laboratory is certified under the Clinical Laboratory Improvement Amendments CLIA as qualified to perform high complexity clinical laboratory testing.    NITROFURANTOIN Value in next row Sensitive      8 SENSITIVEThis is a modified FDA-approved test that has been validated and its performance characteristics determined by the reporting laboratory.  This laboratory is certified under the Clinical Laboratory Improvement Amendments CLIA as qualified to perform high complexity clinical laboratory testing.    TRIMETH /SULFA  Value in next row Resistant      8 SENSITIVEThis is a modified FDA-approved test that has been validated and its performance characteristics determined by the reporting laboratory.  This laboratory is certified under the Clinical Laboratory Improvement Amendments CLIA as qualified to perform high complexity clinical laboratory testing.    AMPICILLIN/SULBACTAM Value in next row Intermediate      8 SENSITIVEThis is a modified FDA-approved test that has been validated and its performance characteristics determined by the reporting laboratory.  This laboratory is certified under the Clinical Laboratory Improvement Amendments CLIA as qualified to perform high complexity clinical laboratory testing.    PIP/TAZO Value in next row Sensitive      16 SENSITIVEThis is a modified FDA-approved test that has been validated and its performance characteristics determined by the reporting laboratory.  This laboratory is certified under the Clinical Laboratory Improvement Amendments CLIA as qualified to perform high complexity clinical laboratory testing.    MEROPENEM  Value in next row Sensitive      16  SENSITIVEThis is a modified FDA-approved test that has been validated and its performance characteristics determined by the reporting laboratory.  This laboratory is certified under the Clinical Laboratory Improvement Amendments CLIA as qualified to perform high complexity clinical laboratory testing.    * >=100,000 COLONIES/mL ESCHERICHIA COLI     Serology:   Imaging: If present, new imagings (plain films, ct scans, and mri) have been personally visualized and interpreted; radiology reports have been reviewed. Decision making incorporated into the Impression / Recommendations.  02/27/24 ct abd pelv with contrast 1. 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 2. Nonobstructing intrarenal stones. 3. Bladder wall thickening with gas in the bladder likely indicating cystitis. Correlate with urinalysis. 4. Small esophageal hiatal hernia. 5.  Diffuse aortic atherosclerosis.    Constance ONEIDA Passer, MD [facility] [phone] pager    03/01/2024, 4:01 PM

## 2024-03-01 NOTE — Progress Notes (Signed)
 Mobility Specialist Progress Note:   03/01/24 1144  Mobility  Activity Pivoted/transferred from bed to chair  Level of Assistance Moderate assist, patient does 50-74%  Assistive Device Stedy  Activity Response Tolerated well  Mobility Referral Yes  Mobility visit 1 Mobility  Mobility Specialist Start Time (ACUTE ONLY) 1046  Mobility Specialist Stop Time (ACUTE ONLY) 1103  Mobility Specialist Time Calculation (min) (ACUTE ONLY) 17 min   Received pt in bed and agreeable to mobility. Pt required ModA +2 w/ the Stedy. No c/o. Pt left in chair with alarm on. Personal belongings and call light within reach. All needs met.  Lavanda Pollack Mobility Specialist  Please contact via [employer] or  Rehab Office [phone]

## 2024-03-02 ENCOUNTER — Encounter (HOSPITAL_COMMUNITY): Payer: Self-pay | Admitting: Internal Medicine

## 2024-03-02 DIAGNOSIS — N3000 Acute cystitis without hematuria: Secondary | ICD-10-CM | POA: Diagnosis not present

## 2024-03-02 MED ORDER — OXYCODONE HCL 5 MG PO TABS: 5.0000 mg | ORAL_TABLET | Freq: Four times a day (QID) | ORAL | 0 refills | Status: AC | PRN

## 2024-03-02 MED ORDER — HYDRALAZINE HCL 10 MG PO TABS: 10.0000 mg | ORAL_TABLET | Freq: Three times a day (TID) | ORAL | Status: AC

## 2024-03-02 MED ORDER — CEFADROXIL 500 MG PO CAPS: 500.0000 mg | ORAL_CAPSULE | Freq: Two times a day (BID) | ORAL | Status: AC

## 2024-03-02 MED ORDER — GLYCERIN (LAXATIVE) 2 G RE SUPP
1.0000 | Freq: Once | RECTAL | Status: DC
  Filled 2024-03-02: qty 1

## 2024-03-02 NOTE — TOC Transition Note (Signed)
 Transition of Care [facility]) - Discharge Note   Patient Details  Name: [name] MRN: [id number] Date of Birth: 23-Jul-1940  [facility] CM/SW Contact:  Almarie CHRISTELLA Goodie, LCSW Phone Number: 03/02/2024, 11:44 AM   Clinical Narrative:   CSW updated by MD that patient was denied after peer to peer, stable for discharge to LTC. CSW sent information to [facility], they are ready for patient to return. CSW left voicemail for [facility], spoke with daughter, Particia, she is in agreement. Transport arranged with [facility] for next available.  Nurse to call report to [phone], Room 807B.    Final next level of care: Skilled Nursing Facility Barriers to Discharge: Barriers Resolved   Patient Goals and CMS Choice     Choice offered to / list presented to : Adult Children (patients daughter)      Discharge Placement              Patient chooses bed at: [facility], [place] Patient to be transferred to facility by: [facility] Name of family member notified: Sandy Patient and family notified of of transfer: 03/02/24  Discharge Plan and Services Additional resources added to the After Visit Summary for   In-house Referral: Clinical Social Work                                   Social Drivers of Health (SDOH) Interventions SDOH Screenings   Food Insecurity: No Food Insecurity (02/27/2024)  Housing: Low Risk  (02/27/2024)  Transportation Needs: No Transportation Needs (02/27/2024)  Utilities: Not At Risk (02/27/2024)  Depression (PHQ2-9): Low Risk  (10/04/2023)  Social Connections: Moderately Isolated (02/27/2024)  Tobacco Use: Low Risk  (03/02/2024)     Readmission Risk Interventions     No data to display

## 2024-03-02 NOTE — TOC Progression Note (Signed)
 Transition of Care [facility]) - Progression Note    Patient Details  Name: [name] MRN: [id number] Date of Birth: 1940-08-15  [facility] CM/SW Contact  Almarie CHRISTELLA Goodie, [place] Phone Number: 03/02/2024, 11:42 AM  Clinical Narrative:   CSW received call from [facility] offering peer to peer for SNF auth request. Information passed on to MD, deadline is noon tomorrow. CSW provided update to [facility], they can accept tomorrow. CSW to follow.    Expected Discharge Plan: Skilled Nursing Facility Barriers to Discharge: Continued Medical Work up               Expected Discharge Plan and Services In-house Referral: Clinical Social Work     Living arrangements for the past 2 months: Skilled Nursing Facility Expected Discharge Date: 03/02/24                                     Social Drivers of Health (SDOH) Interventions SDOH Screenings   Food Insecurity: No Food Insecurity (02/27/2024)  Housing: Low Risk  (02/27/2024)  Transportation Needs: No Transportation Needs (02/27/2024)  Utilities: Not At Risk (02/27/2024)  Depression (PHQ2-9): Low Risk  (10/04/2023)  Social Connections: Moderately Isolated (02/27/2024)  Tobacco Use: Low Risk  (03/02/2024)    Readmission Risk Interventions     No data to display

## 2024-03-02 NOTE — Discharge Summary (Signed)
 Physician Discharge Summary  [name] FMW:[id number] DOB: 30-Nov-1940 DOA: 02/27/2024  PCP: Yolande Toribio MATSU, MD  Admit date: 02/27/2024 Discharge date: 03/02/2024  Admitted From: SNF Disposition:  SNF  Recommendations for Outpatient Follow-up:  Follow up with PCP in 1-2 weeks Please obtain BMP/CBC in one week Follow-up with infectious disease in 3 weeks for repeat imaging, prior to discontinuing cefadroxil  antibiotics Continue cefadroxil  as prescribed below for 4 additional weeks following discharge  Home Health: none Equipment/Devices: none  Discharge Condition: stable CODE STATUS: DNR Diet Orders (From admission, onward)     Start     Ordered   02/27/24 1923  Diet Heart Room service appropriate? Yes; Fluid consistency: Thin  Diet effective now       Question Answer Comment  Room service appropriate? Yes   Fluid consistency: Thin      02/27/24 1923            Brief Narrative / Interim history: [name] is a 83 y.o. female with PMH significant for OSA, HTN, HLD, paroxysmal A-fib, CHF, GERD, scoliosis, lumbar stenosis, h/o epidural abscess, peripheral neuropathy, ESBL UTI. Long-term resident at [facility] nursing home, bedbound status but able to transfer herself in and out of bed with upper body strength. 9/22, patient presented to the ED with complaint of severe abdominal pain, nausea, vomiting, diarrhea. 2 weeks ago, diagnosed with UTI in her nursing home, per daughter she grew ESBL bacteria, completed 1 week course of antibiotics but unclear to me what antibiotics she was on.  On the day of admission the patient had sudden onset left lower quadrant pain, without any urinary symptoms. 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 It also showed, bladder wall thickening with gas in the bladder likely indicating cystitis.  Hospital  Course / Discharge diagnoses: Principal problem UTI, emphysematous cystitis- Recently completed 1 week course of antibiotics for UTI at the nursing home. Called and discussed with [facility], culture data from 8/22 showed ESBL Klebsiella, sensitive to Cipro and levo - floxacin, meropenem .  She completed 7 days of ciprofloxacin.  ID was consulted here and followed patient while hospitalized urine cultures here growing E. coli which is not ESBL.  Based on sensitivities, she was transition to cefadroxil .  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 Per daughters, patient was seen by urologist in the office after last hospitalization in April.  No intervention was recommended.  She is due for 19-month follow-up in October.  Discussed with urology while hospitalized, no interventions are recommended, outpatient follow-up Chronic diastolic CHF, Uncontrolled hypertension -Blood pressure was elevated over 200 in the ED on arrival, continue metoprolol .  Blood pressure still elevated, added hydralazine , slightly better, continue.  Continue to uptitrate as indicated Paroxysmal A-fib - Continue Flecainide  50 mg daily. Continue chronic anticoagulation with Eliquis  2.5 mg twice daily CKD 3A - Creatinine at baseline less than 1.5. Parkinson's disease - Continue Sinemet  HLD - Continue Pletal , pravastatin  OSA - Nightly CPAP Peripheral neuropathy - Continue home regimen  Generalized weakness  Lumbar stenosis Bedbound status and nursing home  - Long-term resident at  [facility] nursing home  Sepsis ruled out   Discharge Instructions   Allergies as of 03/02/2024       Reactions   Lyrica   [pregabalin ] Other (See Comments)   Felt loopy   Zocor  [simvastatin ] Other (See Comments)   Myalgias   Relafen [nabumetone] Rash        Medication List     TAKE these medications    acetaminophen  500 MG tablet Commonly known as: TYLENOL  Take 1,000 mg by mouth 2 (two) times daily.   ARTIFICIAL TEARS OP Place 1 drop into both eyes 2 (two) times daily.   Aspercreme Lidocaine  4 % Crea Generic drug: Lidocaine  HCl Apply 1 Application topically 2 (two) times daily. Apply to both feet topically two times a day for pain   Boost Original Liqd Take 8 oz by mouth 2 (two) times daily.   capsicum 0.075 % topical cream Commonly known as: ZOSTRIX Apply topically 2 (two) times daily.   carbidopa -levodopa  25-100 MG tablet Commonly known as: SINEMET  IR 3 at 8am/2 at noon/2 at 4pm x 2 weeks and then take 2 tablets at 8am/noon/4pm What changed:  how much to take how to take this when to take this additional instructions   cefadroxil  500 MG capsule Commonly known as: DURICEF Take 1 capsule (500 mg total) by mouth 2 (two) times daily for 28 days.   cetirizine 10 MG tablet Commonly known as: ZYRTEC Take 10 mg by mouth daily as needed for allergies.   Cholecalciferol  50 MCG (2000 UT) Tabs Take 4,000 Units by mouth daily.   cilostazol  50 MG tablet Commonly known as: PLETAL  Take 50 mg by mouth 2 (two) times daily.   Culturelle Kids Pack Take 1 tablet by mouth daily.   Eliquis  2.5 MG Tabs tablet Generic drug: apixaban  Take 2.5 mg by mouth 2 (two) times daily.   flecainide  50 MG tablet Commonly known as: TAMBOCOR  Take 50 mg by mouth daily.   fluticasone  50 MCG/ACT nasal spray Commonly known as: FLONASE  Place 2 sprays into both nostrils daily.   furosemide  20 MG tablet Commonly known as: LASIX  TAKE 1 TABLET(20 MG) BY MOUTH DAILY   gabapentin  300 MG capsule Commonly known as: NEURONTIN  Take 2 capsules (600 mg total) by mouth 2 (two) times daily.   hydrALAZINE  10 MG  tablet Commonly known as: APRESOLINE  Take 1 tablet (10 mg total) by mouth every 8 (eight) hours.   metoprolol  succinate 25 MG 24 hr tablet Commonly known as: TOPROL -XL Take 12.5 mg by mouth every evening.   oxyCODONE  5 MG immediate release tablet Commonly known as: Oxy IR/ROXICODONE  Take 1 tablet (5 mg total) by mouth every 6 (six) hours as needed for breakthrough pain.   pantoprazole  40 MG tablet Commonly known as: PROTONIX  Take 40 mg by mouth at bedtime.   potassium chloride  SA 20 MEQ tablet Commonly known as: KLOR-CON  M Take 1 tablet (20 mEq total) by mouth daily. What changed: when to take this   pravastatin  40 MG tablet Commonly known as: PRAVACHOL  Take 40 mg by mouth at bedtime.   PROSTAT PO Take 30 mLs by mouth 2 (two) times daily.   Super B Complex Maxi Tabs Take 1 tablet by mouth daily.   Travatan Z 0.004 % Soln ophthalmic solution Generic drug: Travoprost (BAK Free) Place 1 drop into both eyes at bedtime.   trolamine salicylate 10 % cream Commonly known as: ASPERCREME Apply 1 Application topically 2 (two) times daily.       Consultations: ID Urology  Procedures/Studies:  CT ABDOMEN PELVIS W CONTRAST Result Date: 02/27/2024 CLINICAL DATA:  Acute nonlocalized abdominal pain. Nausea and vomiting. EXAM: CT ABDOMEN AND PELVIS WITH CONTRAST TECHNIQUE: Multidetector CT imaging of the abdomen and pelvis was performed using the standard protocol following bolus administration of intravenous contrast. RADIATION DOSE REDUCTION: This exam was performed according to the departmental dose-optimization program which includes automated exposure control, adjustment of the mA and/or kV according to patient size and/or use of iterative reconstruction technique. CONTRAST:  75mL OMNIPAQUE  IOHEXOL  350 MG/ML SOLN COMPARISON:  MRI abdomen 09/07/2023. CT abdomen and pelvis 09/06/2023. FINDINGS: Lower chest: Atelectasis or scarring in the lung bases. Cardiac enlargement. Small  esophageal hiatal hernia. Hepatobiliary: No focal liver abnormality is seen. No gallstones, gallbladder wall thickening, or biliary dilatation. Pancreas: Unremarkable. No pancreatic ductal dilatation or surrounding inflammatory changes. Spleen: Normal in size without focal abnormality. Adrenals/Urinary Tract: No adrenal gland nodules. Marked hydronephrosis on the left, similar to prior study. Several peripheral calcifications are also unchanged no wall thickening or stranding is seen today. The ureter is decompressed. Right kidney appears normal. There is diffuse bladder wall thickening with gas in the bladder. This could represent cystitis with gas-forming organism or it could indicate sequela of prior catheterization. Gas was present previously in the bladder as well. Stomach/Bowel: Stomach, small bowel, and colon are mostly decompressed. No wall thickening or inflammatory stranding appreciated. The appendix is normal. Diverticulosis of the sigmoid colon without evidence of acute diverticulitis. Vascular/Lymphatic: Aortic atherosclerosis. No enlarged abdominal or pelvic lymph nodes. Reproductive: Status post hysterectomy. No adnexal masses. Other: No abdominal wall hernia or abnormality. No abdominopelvic ascites. Musculoskeletal: Postoperative changes with posterior laminectomies and fusion from the lower thoracic spine through the sacrum. Diffuse bone demineralization. No acute fracture is identified. Curvilinear calcification in the superior right femoral head may indicate early avascular necrosis. IMPRESSION: 1. 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 2. Nonobstructing intrarenal stones. 3. Bladder wall thickening with gas in the bladder likely indicating cystitis. Correlate with urinalysis. 4. Small esophageal hiatal hernia. 5. Diffuse aortic atherosclerosis. Electronically Signed   By: Elsie Gravely M.D.   On: 02/27/2024 17:46      Subjective: - no chest pain, shortness of breath, no abdominal pain, nausea or vomiting.   Discharge Exam: BP (!) 157/58 (BP Location: Left Arm)   Pulse 71   Temp 98.4 F (36.9 C) (Oral)   Resp 18   Ht 5' 6 (1.676 m)   Wt 83 kg   SpO2 100%   BMI 29.53 kg/m   General: Pt is alert, awake, not in acute distress Cardiovascular: RRR, S1/S2 +, no rubs, no gallops Respiratory: CTA bilaterally, no wheezing, no rhonchi Abdominal: Soft, NT, ND, bowel sounds + Extremities: no edema, no cyanosis    The results of significant diagnostics from this hospitalization (including imaging, microbiology, ancillary and laboratory) are listed below for reference.     Microbiology: Recent Results (from the past 240 hours)  Resp panel by RT-PCR (RSV, Flu A&B, Covid) Anterior Nasal Swab     Status: None   Collection Time: 02/27/24  1:33 PM   Specimen: Anterior Nasal Swab  Result Value Ref Range Status   SARS Coronavirus 2 by RT PCR NEGATIVE NEGATIVE Final   Influenza A by PCR NEGATIVE NEGATIVE Final   Influenza B by PCR NEGATIVE NEGATIVE Final    Comment: (NOTE) The Xpert Xpress SARS-CoV-2/FLU/RSV plus assay is intended as an aid in the diagnosis of influenza from Nasopharyngeal  swab specimens and should not be used as a sole basis for treatment. Nasal washings and aspirates are unacceptable for Xpert Xpress SARS-CoV-2/FLU/RSV testing.  Fact Sheet for Patients: BloggerCourse.com  Fact Sheet for Healthcare Providers: SeriousBroker.it  This test is not yet approved or cleared by the [place]  FDA and has been authorized for detection and/or diagnosis of SARS-CoV-2 by FDA under an Emergency Use Authorization (EUA). This EUA will remain in effect (meaning this test can be used) for the duration of the COVID-19 declaration under Section 564(b)(1) of the Act, 21 U.S.C. section 360bbb-3(b)(1), unless the authorization is terminated  or revoked.     Resp Syncytial Virus by PCR NEGATIVE NEGATIVE Final    Comment: (NOTE) Fact Sheet for Patients: BloggerCourse.com  Fact Sheet for Healthcare Providers: SeriousBroker.it  This test is not yet approved or cleared by the [place]  FDA and has been authorized for detection and/or diagnosis of SARS-CoV-2 by FDA under an Emergency Use Authorization (EUA). This EUA will remain in effect (meaning this test can be used) for the duration of the COVID-19 declaration under Section 564(b)(1) of the Act, 21 U.S.C. section 360bbb-3(b)(1), unless the authorization is terminated or revoked.  Performed at [facility], [address], [place], [place] [address]   Urine Culture (for pregnant, neutropenic or urologic patients or patients with an indwelling urinary catheter)     Status: Abnormal   Collection Time: 02/28/24  8:15 AM   Specimen: Urine, Clean Catch  Result Value Ref Range Status   Specimen Description URINE, CLEAN CATCH  Final   Special Requests   Final    NONE Performed at [facility], [address], [place], [place] [address]    Culture >=100,000 COLONIES/mL ESCHERICHIA COLI (A)  Final   Report Status 03/01/2024 FINAL  Final   Organism ID, Bacteria ESCHERICHIA COLI (A)  Final      Susceptibility   Escherichia coli - MIC*    AMPICILLIN >=32 RESISTANT Resistant     CEFAZOLIN  (URINE) Value in next row Sensitive      8 SENSITIVEThis is a modified FDA-approved test that has been validated and its performance characteristics determined by the reporting laboratory.  This laboratory is certified under the Clinical Laboratory Improvement Amendments CLIA as qualified to perform high complexity clinical laboratory testing.    CEFEPIME  Value in next row Sensitive      8 SENSITIVEThis is a modified FDA-approved test that has been validated and its performance characteristics determined by the reporting  laboratory.  This laboratory is certified under the Clinical Laboratory Improvement Amendments CLIA as qualified to perform high complexity clinical laboratory testing.    ERTAPENEM Value in next row Sensitive      8 SENSITIVEThis is a modified FDA-approved test that has been validated and its performance characteristics determined by the reporting laboratory.  This laboratory is certified under the Clinical Laboratory Improvement Amendments CLIA as qualified to perform high complexity clinical laboratory testing.    CEFTRIAXONE  Value in next row Sensitive      8 SENSITIVEThis is a modified FDA-approved test that has been validated and its performance characteristics determined by the reporting laboratory.  This laboratory is certified under the Clinical Laboratory Improvement Amendments CLIA as qualified to perform high complexity clinical laboratory testing.    CIPROFLOXACIN Value in next row Resistant      8 SENSITIVEThis is a modified FDA-approved test that has been validated and its performance characteristics determined by the reporting laboratory.  This  laboratory is certified under the Clinical Laboratory Improvement Amendments CLIA as qualified to perform high complexity clinical laboratory testing.    GENTAMICIN Value in next row Sensitive      8 SENSITIVEThis is a modified FDA-approved test that has been validated and its performance characteristics determined by the reporting laboratory.  This laboratory is certified under the Clinical Laboratory Improvement Amendments CLIA as qualified to perform high complexity clinical laboratory testing.    NITROFURANTOIN Value in next row Sensitive      8 SENSITIVEThis is a modified FDA-approved test that has been validated and its performance characteristics determined by the reporting laboratory.  This laboratory is certified under the Clinical Laboratory Improvement Amendments CLIA as qualified to perform high complexity clinical laboratory testing.     TRIMETH /SULFA  Value in next row Resistant      8 SENSITIVEThis is a modified FDA-approved test that has been validated and its performance characteristics determined by the reporting laboratory.  This laboratory is certified under the Clinical Laboratory Improvement Amendments CLIA as qualified to perform high complexity clinical laboratory testing.    AMPICILLIN/SULBACTAM Value in next row Intermediate      8 SENSITIVEThis is a modified FDA-approved test that has been validated and its performance characteristics determined by the reporting laboratory.  This laboratory is certified under the Clinical Laboratory Improvement Amendments CLIA as qualified to perform high complexity clinical laboratory testing.    PIP/TAZO Value in next row Sensitive      16 SENSITIVEThis is a modified FDA-approved test that has been validated and its performance characteristics determined by the reporting laboratory.  This laboratory is certified under the Clinical Laboratory Improvement Amendments CLIA as qualified to perform high complexity clinical laboratory testing.    MEROPENEM  Value in next row Sensitive      16 SENSITIVEThis is a modified FDA-approved test that has been validated and its performance characteristics determined by the reporting laboratory.  This laboratory is certified under the Clinical Laboratory Improvement Amendments CLIA as qualified to perform high complexity clinical laboratory testing.    * >=100,000 COLONIES/mL ESCHERICHIA COLI     Labs: Basic Metabolic Panel: Recent Labs  Lab 02/27/24 1522 02/27/24 2027 02/28/24 0307 02/29/24 0317 03/01/24 0322  NA 139  --  142 140 140  K 4.4  --  4.2 4.2 4.1  CL 104  --  108 106 107  CO2 24  --  21* 25 21*  GLUCOSE 121*  --  86 110* 103*  BUN 27*  --  21 17 20   CREATININE 1.58* 1.50* 1.40* 1.26* 1.26*  CALCIUM  9.1  --  9.1 9.2 9.1  MG 2.1  --   --   --  2.1   Liver Function Tests: Recent Labs  Lab 02/27/24 1522 02/28/24 0307  03/01/24 0322  AST 23 16 17   ALT 13 17 10   ALKPHOS 104 93 91  BILITOT 0.5 0.5 0.6  PROT 6.8 6.8 6.5  ALBUMIN 3.8 3.6 3.5   CBC: Recent Labs  Lab 02/27/24 1522 02/27/24 2027 02/28/24 0307 03/01/24 0322  WBC 9.0 7.6 6.6 6.3  NEUTROABS 7.2  --   --   --   HGB 11.9* 12.1 12.2 12.2  HCT 39.0 38.4 39.2 39.2  MCV 87.6 85.0 87.1 86.3  PLT 186 172 155 173   CBG: No results for input(s): GLUCAP in the last 168 hours. Hgb A1c No results for input(s): HGBA1C in the last 72 hours. Lipid Profile No results for input(s): CHOL, HDL, LDLCALC,  TRIG, CHOLHDL, LDLDIRECT in the last 72 hours. Thyroid  function studies No results for input(s): TSH, T4TOTAL, T3FREE, THYROIDAB in the last 72 hours.  Invalid input(s): FREET3 Urinalysis    Component Value Date/Time   COLORURINE YELLOW 02/27/2024 1702   APPEARANCEUR HAZY (A) 02/27/2024 1702   LABSPEC <1.005 (L) 02/27/2024 1702   PHURINE 6.5 02/27/2024 1702   GLUCOSEU NEGATIVE 02/27/2024 1702   HGBUR TRACE (A) 02/27/2024 1702   BILIRUBINUR NEGATIVE 02/27/2024 1702   KETONESUR NEGATIVE 02/27/2024 1702   PROTEINUR NEGATIVE 02/27/2024 1702   UROBILINOGEN 1.0 06/26/2010 1009   NITRITE NEGATIVE 02/27/2024 1702   LEUKOCYTESUR MODERATE (A) 02/27/2024 1702    FURTHER DISCHARGE INSTRUCTIONS:   Get Medicines reviewed and adjusted: Please take all your medications with you for your next visit with your Primary MD   Laboratory/radiological data: Please request your Primary MD to go over all hospital tests and procedure/radiological results at the follow up, please ask your Primary MD to get all Hospital records sent to his/her office.   In some cases, they will be blood work, cultures and biopsy results pending at the time of your discharge. Please request that your primary care M.D. goes through all the records of your hospital data and follows up on these results.   Also Note the following: If you experience worsening  of your admission symptoms, develop shortness of breath, life threatening emergency, suicidal or homicidal thoughts you must seek medical attention immediately by calling 911 or calling your MD immediately  if symptoms less severe.   You must read complete instructions/literature along with all the possible adverse reactions/side effects for all the Medicines you take and that have been prescribed to you. Take any new Medicines after you have completely understood and accpet all the possible adverse reactions/side effects.    Do not drive when taking Pain medications or sleeping medications (Benzodaizepines)   Do not take more than prescribed Pain, Sleep and Anxiety Medications. It is not advisable to combine anxiety,sleep and pain medications without talking with your primary care practitioner   Special Instructions: If you have smoked or chewed Tobacco  in the last 2 yrs please stop smoking, stop any regular Alcohol   and or any Recreational drug use.   Wear Seat belts while driving.   Please note: You were cared for by a hospitalist during your hospital stay. Once you are discharged, your primary care physician will handle any further medical issues. Please note that NO REFILLS for any discharge medications will be authorized once you are discharged, as it is imperative that you return to your primary care physician (or establish a relationship with a primary care physician if you do not have one) for your post hospital discharge needs so that they can reassess your need for medications and monitor your lab values.  Time coordinating discharge: 40 minutes  SIGNED:  Nilda Fendt, MD, PhD 03/02/2024, 10:46 AM

## 2024-03-02 NOTE — Progress Notes (Signed)
 Mobility Specialist Progress Note:    03/02/24 1159  Mobility  Activity Pivoted/transferred from bed to chair  Level of Assistance Moderate assist, patient does 50-74%  Assistive Device Stedy  Activity Response Tolerated well  Mobility Referral Yes  Mobility visit 1 Mobility  Mobility Specialist Start Time (ACUTE ONLY) 1010  Mobility Specialist Stop Time (ACUTE ONLY) 1024  Mobility Specialist Time Calculation (min) (ACUTE ONLY) 14 min   Received pt in bed and agreeable to mobility. Pt required ModA +2 w/ Stedy to transfer to the chair. No c/o. Pt left in chair with alarm on. Personal belongings and call light within reach. All needs met.  Lavanda Pollack Mobility Specialist  Please contact via [employer] or  Rehab Office [phone]

## 2024-03-02 NOTE — Progress Notes (Signed)
 Report called to [facility]

## 2024-03-02 NOTE — Plan of Care (Signed)

## 2024-03-02 NOTE — Plan of Care (Signed)

## 2024-03-09 LAB — COMPREHENSIVE METABOLIC PANEL WITH GFR: EGFR: 44

## 2024-03-17 ENCOUNTER — Telehealth: Payer: Self-pay | Admitting: Internal Medicine

## 2024-03-17 NOTE — Telephone Encounter (Signed)
 Received a call from the hospital operator. I called and left voicemail to call back.

## 2024-03-17 NOTE — Telephone Encounter (Signed)
 Spoke with daughter, kim brown, She mentions her mother has been diagnosed with cdifficile at [facility] and that they have stopped her abtx that she was taking for complicated uti.  She mentions that mother was supposed to be on extended oral abtx.   Will defer decision to resume oral abtx (for MDR Ecoli cystitis) to Dr Overton who can see if can see patient sooner vs. Move up imaging.  Montie FURY Luiz MD MPH [facility] [phone]

## 2024-03-21 ENCOUNTER — Telehealth: Payer: Self-pay

## 2024-03-21 DIAGNOSIS — N3080 Other cystitis without hematuria: Secondary | ICD-10-CM

## 2024-03-21 DIAGNOSIS — N3000 Acute cystitis without hematuria: Secondary | ICD-10-CM

## 2024-03-21 NOTE — Addendum Note (Signed)
 Addended by: MELVENIA COREAN SAILOR on: 03/21/2024 03:12 PM   Modules accepted: Orders

## 2024-03-21 NOTE — Telephone Encounter (Signed)
 Thanks

## 2024-03-21 NOTE — Telephone Encounter (Signed)
 Received update from SNF RN stating MD at SNF stopped Cefadroxil ( Original stop date for Cefadroxil  noted through 03/30/24)  Patient recently diagnosed with Cdiff and started on Vancomycin  250 mg QID for 10 days ; then Vancomycin  250mg  two times daily for 7 days beginning 10/10.  SNF just wanted to update ID regarding antibiotic chances.   Future Fax communication to [phone], Attn: Viviane Hurst - Ailen, LPN.   Staff routed information to provider for awareness.  [name] Kleine, LPN

## 2024-03-21 NOTE — Telephone Encounter (Signed)
 Can we have the Nursing home send over the labs that diagnosed her CDiff including if they have any CBC results or recent creatinine?   I would like to go ahead and get her imaging done if we can ahead of time if we can since her appt with me is not for another week.   Will put in order for CT abdomen and pelvis (non-contrasted)

## 2024-03-22 ENCOUNTER — Ambulatory Visit (HOSPITAL_BASED_OUTPATIENT_CLINIC_OR_DEPARTMENT_OTHER)
Admission: RE | Admit: 2024-03-22 | Discharge: 2024-03-22 | Disposition: A | Discharge status: Home / Self Care | Source: Ambulatory Visit | Attending: Infectious Diseases | Admitting: Infectious Diseases

## 2024-03-22 ENCOUNTER — Ambulatory Visit: Payer: Self-pay | Admitting: Infectious Diseases

## 2024-03-22 DIAGNOSIS — N3080 Other cystitis without hematuria: Secondary | ICD-10-CM | POA: Insufficient documentation

## 2024-03-23 NOTE — Telephone Encounter (Signed)
 Daughter made aware of ID plan. Daughter aware of appointment on Monday 10/20.   Family appreciative of call from ID office.  Enis Kleine, LPN

## 2024-03-26 ENCOUNTER — Ambulatory Visit: Admitting: Infectious Diseases

## 2024-03-26 ENCOUNTER — Other Ambulatory Visit: Payer: Self-pay

## 2024-03-26 VITALS — BP 125/76 | HR 68 | Wt 182.0 lb

## 2024-03-26 DIAGNOSIS — N3080 Other cystitis without hematuria: Secondary | ICD-10-CM | POA: Diagnosis not present

## 2024-03-26 NOTE — Progress Notes (Signed)
 Thanks. I will send all my new uti referral to you  (:

## 2024-03-26 NOTE — Progress Notes (Signed)
 Some one asked me about d mannose   I almost say you are very generous for supporting homeopathic medicine

## 2024-03-26 NOTE — Progress Notes (Signed)
 Patient: [name]  DOB: May 31, 1941 MRN: [id number] PCP: Yolande Toribio MATSU, MD   Reason for Visit: hospital follow up    CC: CDiff infection (new since d/c). Feeling 60% better.     Subjective   Subjective:    Discussed the use of AI scribe software for clinical note transcription with the patient, who gave verbal consent to proceed.  History of Present Illness   [name] is an 83 year old female who presents for routine follow-up after hospitalization for E coli emphysematous cystitis. She is accompanied by her daughter, Luke, and her sister, Particia, is on the phone.  She was recently hospitalized for emphysematous cystitis caused by a more resistant E. coli. No burning during urination or fever is present. Her daughters mention she never experiences obvious symptoms of bladder infections. She experiences incontinence routinely. Main clue is confusion   She developed diarrhea due to a C. difficile infection that developed on treatment for her bladder infection. She feels '60 to 70 percent better' since starting vancomycin . Bowel movements are now less than three times a day and are described as 'toothpaste'/semi formed consistency, indicating improvement. No cramping during bowel movements is reported. She is currently on vancomycin , with the last dose scheduled for tomorrow.  She has a poor appetite and dislikes the meals provided at the nursing home due to lack of flavor and poor quality. She struggles with eating and drinking adequately. She prefers Pepsi over water and has difficulty increasing her water intake. She drinks tea regularly and occasionally finishes one cup of tea but struggles with a second cup. She also consumes soups and applesauce, which help with hydration.  She is currently taking pantoprazole .       Review of Systems  Constitutional:  Negative for fever.  Respiratory: Negative.    Cardiovascular: Negative.   Gastrointestinal:   Positive for diarrhea. Negative for abdominal pain and vomiting.  Genitourinary:  Positive for frequency (incontinence). Negative for dysuria and urgency.  Neurological:  Negative for dizziness.    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     B Complex-Folic Acid  (SUPER B COMPLEX MAXI) TABS Take 1 tablet by mouth daily.     capsicum (ZOSTRIX) 0.075 % topical cream Apply topically 2 (two) times daily.     carbidopa -levodopa  (SINEMET  IR) 25-100 MG tablet 3 at 8am/2 at noon/2 at 4pm x 2 weeks and then take 2 tablets at 8am/noon/4pm (Patient taking differently: Take 2 tablets by mouth 3 (three) times daily.) 540 tablet 1   Carboxymethylcellulose Sodium (ARTIFICIAL TEARS OP) Place 1 drop into both eyes 2 (two) times daily.  cefadroxil  (DURICEF) 500 MG capsule Take 1 capsule (500 mg total) by mouth 2 (two) times daily for 28 days.     cetirizine (ZYRTEC) 10 MG tablet Take 10 mg by mouth daily as needed for allergies.     Cholecalciferol  50 MCG (2000 UT) TABS Take 4,000 Units by mouth daily.     cilostazol  (PLETAL ) 50 MG tablet Take 50 mg by mouth 2 (two) times daily.     ELIQUIS  2.5 MG TABS tablet Take 2.5 mg by mouth 2 (two) times daily.     flecainide  (TAMBOCOR ) 50 MG  tablet Take 50 mg by mouth daily.     fluticasone  (FLONASE ) 50 MCG/ACT nasal spray Place 2 sprays into both nostrils daily.     furosemide  (LASIX ) 20 MG tablet TAKE 1 TABLET(20 MG) BY MOUTH DAILY 90 tablet 3   gabapentin  (NEURONTIN ) 300 MG capsule Take 2 capsules (600 mg total) by mouth 2 (two) times daily.     hydrALAZINE  (APRESOLINE ) 10 MG tablet Take 1 tablet (10 mg total) by mouth every 8 (eight) hours.     Lactobacillus Rhamnosus, GG, (CULTURELLE KIDS) PACK Take 1 tablet by mouth daily.     Lidocaine  HCl (ASPERCREME LIDOCAINE ) 4 % CREA Apply 1 Application topically 2 (two) times daily. Apply to both feet topically two times a day for pain     metoprolol  succinate (TOPROL -XL) 25 MG 24 hr tablet Take 12.5 mg by mouth every evening.     Nutritional Supplements (BOOST ORIGINAL) LIQD Take 8 oz by mouth 2 (two) times daily.     oxyCODONE  (OXY IR/ROXICODONE ) 5 MG immediate release tablet Take 1 tablet (5 mg total) by mouth every 6 (six) hours as needed for breakthrough pain. 10 tablet 0   pantoprazole  (PROTONIX ) 40 MG tablet Take 40 mg by mouth at bedtime.     Pollen Extracts (PROSTAT PO) Take 30 mLs by mouth 2 (two) times daily.     potassium chloride  SA (K-DUR) 20 MEQ tablet Take 1 tablet (20 mEq total) by mouth daily. (Patient taking differently: Take 20 mEq by mouth at bedtime.) 90 tablet 3   pravastatin  (PRAVACHOL ) 40 MG tablet Take 40 mg by mouth at bedtime.     TRAVATAN Z 0.004 % SOLN ophthalmic solution Place 1 drop into both eyes at bedtime.   0   trolamine salicylate (ASPERCREME) 10 % cream Apply 1 Application topically 2 (two) times daily.     No facility-administered medications prior to visit.     Allergies  Allergen Reactions   Lyrica  [Pregabalin ] Other (See Comments)    Felt loopy   Zocor  [Simvastatin ] Other (See Comments)    Myalgias   Relafen [Nabumetone] Rash    Social History   Tobacco Use   Smoking status: Never   Smokeless tobacco: Never  Vaping Use   Vaping  status: Never Used  Substance Use Topics   Alcohol  use: No    Alcohol /week: 0.0 standard drinks of alcohol    Drug use: No    Family History  Problem Relation Age of Onset   Arthritis Mother    Heart attack Father    Hypertension Sister    Lung cancer Sister    Asthma Sister    Allergies Sister        Objective   Objective:   Vitals:   03/26/24 0915  BP: 125/76  Pulse: 68  SpO2: 100%  Weight: 182 lb (82.6 kg)   Body mass index is 29.38 kg/m.  Physical Exam Constitutional:  Appearance: Normal appearance. She is not ill-appearing.  HENT:     Mouth/Throat:     Mouth: Mucous membranes are moist.     Pharynx: Oropharynx is clear.  Eyes:     General: No scleral icterus. Cardiovascular:     Rate and Rhythm: Normal rate.  Pulmonary:     Effort: Pulmonary effort is normal.  Neurological:     Mental Status: She is oriented to person, place, and time.  Psychiatric:        Mood and Affect: Mood normal.        Thought Content: Thought content normal.        Assessment & Plan:     Clostridioides difficile infection (C. diff) - C. diff infection likely secondary to broad-spectrum antibiotic use during hospitalization for emphysematous cystitis. Symptoms have improved with vancomycin  treatment, with bowel movements now less than three times a day and semi-formed. No abdominal cramping reported. Discussed the role of pantoprazole  in increasing the risk of C. diff recurrence due to its effect on gastric acid production.  She has completed 10 days + a little more to finish out 7d beyond abx stop. Given first occurrence can stop vancomycin  after tomorrow's dose.  - Stop pantoprazole  to reduce risk of C. diff recurrence. - Complete current course of vancomycin , with the last dose scheduled for tomorrow. - Monitor for signs of C. diff relapse, such as watery diarrhea or increased frequency of bowel movements.  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 coli has resolved as confirmed by recent CT imaging. No need for further antibiotics as there are no symptoms of urinary tract infection, such as burning during urination or fever. - if clinical suspicion for UTI again would consider fosfomycin 3 gm with optional second dose in 3 days.  - GFR too low for nitrofurantoin unfortunately.  - R- sulfa    Urinary incontinence - Chronic urinary incontinence complicating the detection of urinary tract infections due to lack of typical symptoms. Emphasized the importance of maintaining hydration to help prevent bacterial attachment and infection. - Encourage increased fluid intake to promote regular urination and reduce infection risk.  Poor oral intake and inadequate fluid intake - Poor oral intake and inadequate fluid intake noted, with a preference for Pepsi over water. Discussed the importance of hydration and explored alternative ways to increase fluid intake, such as through tea, soups, and water-rich foods like fruits and applesauce. - Encourage consumption of water-rich foods such as soups, fruits, and applesauce. - Promote drinking tea as an alternative to water.  PRN follow up if any concerns for recurrent CDI come up.       No orders of the defined types were placed in this encounter.   No orders of the defined types were placed in this encounter.   No follow-ups on file.   Corean Fireman, MSN, NP-C [facility]  [email or web address] Pager: [phone] Office: [phone] [facility] Main Line: [phone] *Secure Chat Communication Welcome

## 2024-04-26 ENCOUNTER — Encounter: Payer: Self-pay | Admitting: Neurology

## 2024-07-17 ENCOUNTER — Ambulatory Visit: Admitting: Neurology

## 2024-07-19 ENCOUNTER — Ambulatory Visit: Admitting: Neurology
